# Patient Record
Sex: Female | Born: 1935 | Race: White | Hispanic: No | Smoking: Former smoker
Health system: Southern US, Community
[De-identification: ages and names within clinical notes are randomized; demographics above are authoritative.]

## PROBLEM LIST (undated history)

## (undated) DIAGNOSIS — F419 Anxiety disorder, unspecified: Secondary | ICD-10-CM

## (undated) DIAGNOSIS — I739 Peripheral vascular disease, unspecified: Secondary | ICD-10-CM

## (undated) DIAGNOSIS — I471 Supraventricular tachycardia, unspecified: Secondary | ICD-10-CM

## (undated) DIAGNOSIS — R55 Syncope and collapse: Secondary | ICD-10-CM

## (undated) DIAGNOSIS — E785 Hyperlipidemia, unspecified: Secondary | ICD-10-CM

## (undated) DIAGNOSIS — Z789 Other specified health status: Secondary | ICD-10-CM

## (undated) DIAGNOSIS — N183 Chronic kidney disease, stage 3 unspecified: Secondary | ICD-10-CM

## (undated) DIAGNOSIS — J439 Emphysema, unspecified: Secondary | ICD-10-CM

## (undated) DIAGNOSIS — R569 Unspecified convulsions: Secondary | ICD-10-CM

## (undated) DIAGNOSIS — N814 Uterovaginal prolapse, unspecified: Secondary | ICD-10-CM

## (undated) DIAGNOSIS — I10 Essential (primary) hypertension: Secondary | ICD-10-CM

## (undated) HISTORY — DX: Uterovaginal prolapse, unspecified: N81.4

## (undated) HISTORY — DX: Other specified health status: Z78.9

## (undated) HISTORY — DX: Essential (primary) hypertension: I10

## (undated) HISTORY — PX: CHOLECYSTECTOMY: SHX55

## (undated) HISTORY — DX: Supraventricular tachycardia, unspecified: I47.10

## (undated) HISTORY — DX: Supraventricular tachycardia: I47.1

## (undated) HISTORY — DX: Chronic kidney disease, stage 3 unspecified: N18.30

## (undated) HISTORY — DX: Emphysema, unspecified: J43.9

## (undated) HISTORY — DX: Anxiety disorder, unspecified: F41.9

## (undated) HISTORY — DX: Hyperlipidemia, unspecified: E78.5

## (undated) HISTORY — PX: TONSILLECTOMY: SUR1361

## (undated) HISTORY — DX: Peripheral vascular disease, unspecified: I73.9

## (undated) HISTORY — DX: Chronic kidney disease, stage 3 (moderate): N18.3

## (undated) HISTORY — DX: Syncope and collapse: R55

## (undated) HISTORY — PX: OTHER SURGICAL HISTORY: SHX169

---

## 1993-08-05 HISTORY — PX: OTHER SURGICAL HISTORY: SHX169

## 2001-08-07 ENCOUNTER — Other Ambulatory Visit: Admission: RE | Admit: 2001-08-07 | Discharge: 2001-08-07 | Payer: Self-pay | Admitting: Gynecology

## 2001-08-24 ENCOUNTER — Other Ambulatory Visit: Admission: RE | Admit: 2001-08-24 | Discharge: 2001-08-24 | Payer: Self-pay | Admitting: Obstetrics and Gynecology

## 2003-05-13 ENCOUNTER — Emergency Department (HOSPITAL_COMMUNITY): Admission: EM | Admit: 2003-05-13 | Discharge: 2003-05-13 | Payer: Self-pay | Admitting: Emergency Medicine

## 2004-12-25 ENCOUNTER — Ambulatory Visit: Payer: Self-pay | Admitting: Internal Medicine

## 2004-12-28 ENCOUNTER — Ambulatory Visit: Payer: Self-pay | Admitting: Internal Medicine

## 2004-12-28 ENCOUNTER — Ambulatory Visit (HOSPITAL_COMMUNITY): Admission: RE | Admit: 2004-12-28 | Discharge: 2004-12-28 | Payer: Self-pay | Admitting: Internal Medicine

## 2004-12-30 ENCOUNTER — Observation Stay (HOSPITAL_COMMUNITY): Admission: RE | Admit: 2004-12-30 | Discharge: 2004-12-31 | Payer: Self-pay | Admitting: General Surgery

## 2004-12-30 ENCOUNTER — Encounter (INDEPENDENT_AMBULATORY_CARE_PROVIDER_SITE_OTHER): Payer: Self-pay | Admitting: Specialist

## 2005-02-11 ENCOUNTER — Ambulatory Visit: Payer: Self-pay | Admitting: Cardiology

## 2005-02-22 ENCOUNTER — Ambulatory Visit: Payer: Self-pay

## 2006-07-13 ENCOUNTER — Ambulatory Visit: Payer: Self-pay | Admitting: Cardiology

## 2006-08-19 ENCOUNTER — Ambulatory Visit: Payer: Self-pay | Admitting: Internal Medicine

## 2006-10-21 ENCOUNTER — Ambulatory Visit: Payer: Self-pay | Admitting: Internal Medicine

## 2006-10-25 ENCOUNTER — Ambulatory Visit: Payer: Self-pay | Admitting: Internal Medicine

## 2006-10-25 LAB — CONVERTED CEMR LAB
BUN: 25 mg/dL — ABNORMAL HIGH (ref 6–23)
Calcium: 8.5 mg/dL (ref 8.4–10.5)
Cholesterol: 227 mg/dL (ref 0–200)
HDL: 28.8 mg/dL — ABNORMAL LOW (ref >39.0)
LDL DIRECT: 152 mg/dL
Sodium: 140 meq/L (ref 135–145)
Triglyceride fasting, serum: 141 mg/dL (ref 0–149)

## 2007-08-09 ENCOUNTER — Ambulatory Visit: Payer: Self-pay | Admitting: Cardiology

## 2007-08-25 ENCOUNTER — Telehealth: Payer: Self-pay | Admitting: Internal Medicine

## 2007-10-16 ENCOUNTER — Encounter: Payer: Self-pay | Admitting: Internal Medicine

## 2007-10-17 ENCOUNTER — Telehealth: Payer: Self-pay | Admitting: Internal Medicine

## 2007-11-14 ENCOUNTER — Encounter: Payer: Self-pay | Admitting: Internal Medicine

## 2007-11-14 DIAGNOSIS — I10 Essential (primary) hypertension: Secondary | ICD-10-CM

## 2007-11-14 DIAGNOSIS — E785 Hyperlipidemia, unspecified: Secondary | ICD-10-CM | POA: Insufficient documentation

## 2007-11-14 DIAGNOSIS — Z8679 Personal history of other diseases of the circulatory system: Secondary | ICD-10-CM | POA: Insufficient documentation

## 2007-11-14 DIAGNOSIS — I471 Supraventricular tachycardia: Secondary | ICD-10-CM

## 2007-11-14 DIAGNOSIS — N814 Uterovaginal prolapse, unspecified: Secondary | ICD-10-CM | POA: Insufficient documentation

## 2008-01-19 ENCOUNTER — Ambulatory Visit: Payer: Self-pay | Admitting: Internal Medicine

## 2008-01-19 DIAGNOSIS — F419 Anxiety disorder, unspecified: Secondary | ICD-10-CM | POA: Insufficient documentation

## 2008-01-19 LAB — CONVERTED CEMR LAB
ALT: 16 units/L (ref 0–35)
AST: 20 units/L (ref 0–37)
Albumin: 3.5 g/dL (ref 3.5–5.2)
CO2: 29 meq/L (ref 19–32)
Chloride: 108 meq/L (ref 96–112)
Cholesterol: 255 mg/dL (ref 0–200)
Creatinine, Ser: 1.3 mg/dL — ABNORMAL HIGH (ref 0.4–1.2)
HDL: 27.4 mg/dL — ABNORMAL LOW (ref >39.0)
Potassium: 4.7 meq/L (ref 3.5–5.1)
Sodium: 142 meq/L (ref 135–145)
Total Protein: 7.1 g/dL (ref 6.0–8.3)
VLDL: 35 mg/dL (ref 0–40)

## 2008-01-20 ENCOUNTER — Encounter: Payer: Self-pay | Admitting: Internal Medicine

## 2008-08-02 ENCOUNTER — Ambulatory Visit: Payer: Self-pay | Admitting: Internal Medicine

## 2008-08-15 ENCOUNTER — Ambulatory Visit: Payer: Self-pay | Admitting: Cardiovascular Disease

## 2008-08-15 LAB — CONVERTED CEMR LAB
CO2: 30 meq/L (ref 19–32)
Chloride: 107 meq/L (ref 96–112)
Sodium: 143 meq/L (ref 135–145)

## 2008-08-16 ENCOUNTER — Ambulatory Visit: Payer: Self-pay

## 2008-08-29 ENCOUNTER — Ambulatory Visit: Payer: Self-pay

## 2008-09-04 ENCOUNTER — Ambulatory Visit: Payer: Self-pay | Admitting: Cardiovascular Disease

## 2008-09-04 LAB — CONVERTED CEMR LAB
BUN: 29 mg/dL — ABNORMAL HIGH (ref 6–23)
CO2: 30 meq/L (ref 19–32)
Creatinine, Ser: 1.2 mg/dL (ref 0.4–1.2)
GFR calc Af Amer: 56 mL/min
GFR calc non Af Amer: 47 mL/min
Glucose, Bld: 104 mg/dL — ABNORMAL HIGH (ref 70–99)

## 2008-09-10 ENCOUNTER — Ambulatory Visit (HOSPITAL_COMMUNITY): Admission: RE | Admit: 2008-09-10 | Discharge: 2008-09-10 | Payer: Self-pay | Admitting: Cardiovascular Disease

## 2008-10-17 ENCOUNTER — Encounter: Payer: Self-pay | Admitting: Internal Medicine

## 2009-03-05 ENCOUNTER — Telehealth: Payer: Self-pay | Admitting: Internal Medicine

## 2009-04-14 ENCOUNTER — Telehealth: Payer: Self-pay | Admitting: Cardiovascular Disease

## 2009-04-15 ENCOUNTER — Telehealth: Payer: Self-pay | Admitting: Internal Medicine

## 2009-04-16 ENCOUNTER — Ambulatory Visit: Payer: Self-pay | Admitting: Internal Medicine

## 2009-04-28 ENCOUNTER — Telehealth (INDEPENDENT_AMBULATORY_CARE_PROVIDER_SITE_OTHER): Payer: Self-pay | Admitting: *Deleted

## 2009-04-29 ENCOUNTER — Ambulatory Visit: Payer: Self-pay | Admitting: Internal Medicine

## 2009-04-29 LAB — CONVERTED CEMR LAB
ALT: 12 units/L (ref 0–35)
AST: 21 units/L (ref 0–37)
Alkaline Phosphatase: 72 units/L (ref 39–117)
Basophils Relative: 1.2 % (ref 0.0–3.0)
Bilirubin, Direct: 0.1 mg/dL (ref 0.0–0.3)
CO2: 27 meq/L (ref 19–32)
HCT: 37.8 % (ref 36.0–46.0)
Hemoglobin: 12.8 g/dL (ref 12.0–15.0)
Monocytes Relative: 5.8 % (ref 3.0–12.0)
Platelets: 223 10*3/uL (ref 150.0–400.0)
Total Protein: 7.5 g/dL (ref 6.0–8.3)
WBC: 7.7 10*3/uL (ref 4.5–10.5)

## 2009-05-02 ENCOUNTER — Telehealth: Payer: Self-pay | Admitting: Internal Medicine

## 2009-05-06 ENCOUNTER — Ambulatory Visit: Payer: Self-pay | Admitting: *Deleted

## 2009-05-06 ENCOUNTER — Ambulatory Visit: Payer: Self-pay | Admitting: Cardiovascular Disease

## 2009-05-06 ENCOUNTER — Inpatient Hospital Stay (HOSPITAL_COMMUNITY): Admission: EM | Admit: 2009-05-06 | Discharge: 2009-05-09 | Payer: Self-pay | Admitting: Emergency Medicine

## 2009-05-06 ENCOUNTER — Ambulatory Visit: Payer: Self-pay | Admitting: Internal Medicine

## 2009-05-07 ENCOUNTER — Encounter (INDEPENDENT_AMBULATORY_CARE_PROVIDER_SITE_OTHER): Payer: Self-pay | Admitting: Internal Medicine

## 2009-05-08 ENCOUNTER — Encounter: Payer: Self-pay | Admitting: Internal Medicine

## 2009-05-10 ENCOUNTER — Telehealth (INDEPENDENT_AMBULATORY_CARE_PROVIDER_SITE_OTHER): Payer: Self-pay | Admitting: *Deleted

## 2009-05-13 ENCOUNTER — Ambulatory Visit: Payer: Self-pay | Admitting: Internal Medicine

## 2009-05-15 ENCOUNTER — Telehealth: Payer: Self-pay | Admitting: Internal Medicine

## 2009-06-09 ENCOUNTER — Telehealth: Payer: Self-pay | Admitting: Internal Medicine

## 2009-06-09 ENCOUNTER — Ambulatory Visit: Payer: Self-pay | Admitting: Internal Medicine

## 2009-06-09 DIAGNOSIS — I951 Orthostatic hypotension: Secondary | ICD-10-CM | POA: Insufficient documentation

## 2009-06-10 ENCOUNTER — Inpatient Hospital Stay (HOSPITAL_COMMUNITY): Admission: EM | Admit: 2009-06-10 | Discharge: 2009-06-12 | Payer: Self-pay | Admitting: Cardiovascular Disease

## 2009-06-10 ENCOUNTER — Encounter: Payer: Self-pay | Admitting: Emergency Medicine

## 2009-06-30 ENCOUNTER — Telehealth: Payer: Self-pay | Admitting: Cardiovascular Disease

## 2009-07-02 ENCOUNTER — Telehealth (INDEPENDENT_AMBULATORY_CARE_PROVIDER_SITE_OTHER): Payer: Self-pay | Admitting: *Deleted

## 2009-07-04 ENCOUNTER — Ambulatory Visit: Payer: Self-pay | Admitting: Internal Medicine

## 2009-07-04 DIAGNOSIS — F329 Major depressive disorder, single episode, unspecified: Secondary | ICD-10-CM

## 2009-07-10 ENCOUNTER — Ambulatory Visit: Payer: Self-pay | Admitting: Internal Medicine

## 2009-07-16 ENCOUNTER — Observation Stay (HOSPITAL_COMMUNITY): Admission: EM | Admit: 2009-07-16 | Discharge: 2009-07-17 | Payer: Self-pay | Admitting: Emergency Medicine

## 2009-07-17 ENCOUNTER — Encounter: Payer: Self-pay | Admitting: Internal Medicine

## 2009-07-17 DIAGNOSIS — I5032 Chronic diastolic (congestive) heart failure: Secondary | ICD-10-CM

## 2009-08-04 ENCOUNTER — Ambulatory Visit: Payer: Self-pay | Admitting: Internal Medicine

## 2009-08-04 LAB — CONVERTED CEMR LAB
BUN: 20 mg/dL (ref 6–23)
CO2: 30 meq/L (ref 19–32)
Calcium: 9 mg/dL (ref 8.4–10.5)
Chloride: 102 meq/L (ref 96–112)
Creatinine, Ser: 1.4 mg/dL — ABNORMAL HIGH (ref 0.4–1.2)
Glucose, Bld: 75 mg/dL (ref 70–99)
Sodium: 143 meq/L (ref 135–145)

## 2009-09-08 ENCOUNTER — Ambulatory Visit: Payer: Self-pay | Admitting: Internal Medicine

## 2009-09-08 DIAGNOSIS — I739 Peripheral vascular disease, unspecified: Secondary | ICD-10-CM | POA: Insufficient documentation

## 2009-09-17 ENCOUNTER — Telehealth: Payer: Self-pay | Admitting: Internal Medicine

## 2009-09-24 ENCOUNTER — Encounter: Payer: Self-pay | Admitting: Internal Medicine

## 2009-09-25 ENCOUNTER — Telehealth: Payer: Self-pay | Admitting: Internal Medicine

## 2009-10-01 ENCOUNTER — Telehealth: Payer: Self-pay | Admitting: Internal Medicine

## 2009-11-07 ENCOUNTER — Ambulatory Visit: Payer: Self-pay | Admitting: Internal Medicine

## 2009-11-11 ENCOUNTER — Encounter: Payer: Self-pay | Admitting: Internal Medicine

## 2009-11-11 LAB — CONVERTED CEMR LAB
AST: 19 units/L (ref 0–37)
Triglycerides: 79 mg/dL (ref 0.0–149.0)

## 2009-11-14 ENCOUNTER — Telehealth: Payer: Self-pay | Admitting: Internal Medicine

## 2009-12-07 ENCOUNTER — Encounter: Payer: Self-pay | Admitting: Internal Medicine

## 2009-12-12 ENCOUNTER — Telehealth: Payer: Self-pay | Admitting: Internal Medicine

## 2009-12-23 ENCOUNTER — Telehealth: Payer: Self-pay | Admitting: Internal Medicine

## 2010-01-09 ENCOUNTER — Ambulatory Visit: Payer: Self-pay | Admitting: Internal Medicine

## 2010-01-15 LAB — CONVERTED CEMR LAB
Cholesterol: 129 mg/dL (ref 0–200)
HDL: 38.9 mg/dL — ABNORMAL LOW (ref >39.00)

## 2010-04-10 ENCOUNTER — Ambulatory Visit: Payer: Self-pay | Admitting: Internal Medicine

## 2010-04-10 DIAGNOSIS — Z8679 Personal history of other diseases of the circulatory system: Secondary | ICD-10-CM

## 2010-05-06 ENCOUNTER — Telehealth: Payer: Self-pay | Admitting: Internal Medicine

## 2010-05-08 ENCOUNTER — Telehealth: Payer: Self-pay | Admitting: Internal Medicine

## 2010-06-30 ENCOUNTER — Encounter: Payer: Self-pay | Admitting: Internal Medicine

## 2010-07-02 ENCOUNTER — Telehealth: Payer: Self-pay | Admitting: Internal Medicine

## 2010-10-13 ENCOUNTER — Telehealth (INDEPENDENT_AMBULATORY_CARE_PROVIDER_SITE_OTHER): Payer: Self-pay | Admitting: *Deleted

## 2010-10-14 ENCOUNTER — Encounter: Payer: Self-pay | Admitting: Internal Medicine

## 2010-11-05 ENCOUNTER — Telehealth: Payer: Self-pay | Admitting: Internal Medicine

## 2010-11-08 ENCOUNTER — Encounter: Payer: Self-pay | Admitting: Internal Medicine

## 2010-11-08 ENCOUNTER — Encounter: Payer: Self-pay | Admitting: Cardiovascular Disease

## 2010-11-11 ENCOUNTER — Encounter: Payer: Self-pay | Admitting: Internal Medicine

## 2010-11-15 LAB — CONVERTED CEMR LAB
BUN: 27 mg/dL — ABNORMAL HIGH (ref 6–23)
Bilirubin, Direct: 0.3 mg/dL (ref 0.0–0.3)
CO2: 22 meq/L (ref 19–32)
CO2: 28 meq/L (ref 19–32)
Calcium: 9.6 mg/dL (ref 8.4–10.5)
Chloride: 100 meq/L (ref 96–112)
Creatinine, Ser: 1.74 mg/dL — ABNORMAL HIGH (ref 0.40–1.20)
GFR calc non Af Amer: 51.42 mL/min (ref >60)
Glucose, Bld: 106 mg/dL — ABNORMAL HIGH (ref 70–99)
Glucose, Bld: 90 mg/dL (ref 70–99)
HCT: 40.4 % (ref 36.0–46.0)
INR: 1.1 — ABNORMAL HIGH (ref 0.8–1.0)
Lymphs Abs: 1.1 10*3/uL (ref 0.7–4.0)
Monocytes Relative: 4.2 % (ref 3.0–12.0)
Neutro Abs: 5.9 10*3/uL (ref 1.4–7.7)
Neutrophils Relative %: 80.3 % — ABNORMAL HIGH (ref 43.0–77.0)
Platelets: 230 10*3/uL (ref 150.0–400.0)
Sodium: 144 meq/L (ref 135–145)
Total Bilirubin: 0.4 mg/dL (ref 0.3–1.2)
Total Protein: 8.1 g/dL (ref 6.0–8.3)

## 2010-11-17 NOTE — Progress Notes (Signed)
    Immunization History:  Influenza Immunization History:    Influenza:  historical (06/30/2010)

## 2010-11-17 NOTE — Progress Notes (Signed)
Summary: Driving  Phone Note Call from Patient Call back at Home Phone [phone]   Caller: Patient Summary of Call: Patient called requesting to know if MD gives her permission to drive or if she should wait a few more days. Please advise Initial call taken by: Rock Nephew CMA,  May 15, 2009 9:39 AM  Follow-up for Phone Call        may drive if heart rate is staying below 130 and she is not having any near-syncopal spells or symptoms Follow-up by: Jacques Navy MD,  May 15, 2009 1:16 PM  Additional Follow-up for Phone Call Additional follow up Details #1::        Pt notified Additional Follow-up by: Orlan Leavens,  May 15, 2009 1:58 PM

## 2010-11-17 NOTE — Letter (Signed)
Summary: Letter from Patient  Letter from Patient   Imported By: Sherian Rein 12/29/2009 08:18:22  _____________________________________________________________________  External Attachment:    Type:   Image     Comment:   External Document

## 2010-11-17 NOTE — Progress Notes (Signed)
Summary: req call back  Phone Note Call from Patient Call back at Home Phone [phone]   Caller: Patient Reason for Call: Talk to Nurse Summary of Call: req call back.PLEASE CALL AFTER 2:00 Initial call taken by: Migdalia Dk,  November 14, 2009 8:27 AM  Follow-up for Phone Call        Called patient..she needs new script for Crestor 5mg . Will send to [facility]. Follow-up by: J REISS RN    Prescriptions: CRESTOR 5 MG TABS (ROSUVASTATIN CALCIUM) one  tablet every day at bedtime  #30 x 3   Entered by:   Layne Benton, RN, BSN   Authorized by:   Sherrill Raring, MD, [facility]   Signed by:   Layne Benton, RN, BSN on 11/14/2009   Method used:   Electronically to        [facility] [phone]* (retail)       [address]       [place], [place]  [address]       Ph: [phone]       Fax: [phone]   RxID:   [id number]

## 2010-11-17 NOTE — Progress Notes (Signed)
Summary: ?'s  Phone Note Call from Patient   Summary of Call: Pt dropped of a note with questions whether or not to stay on gen welbutrin. Yes per Dr. Rock Nephew informed, refill sent in.   Pt read the insert with the rx, crestor. It says to inform dr about medication prior to surgery or dental work. Does this include regular dental check up? Fillings? or major work? She spoke with pharmacist and dentist but they all said to talk to the Dr. Per pt, cardiology has not called her back about this and she would like Dr Debby Bud opinion.  Initial call taken by: Lamar Sprinkles, CMA,  December 12, 2009 8:41 AM  Follow-up for Phone Call        crestor and dental work no problem Follow-up by: Jacques Navy MD,  December 15, 2009 10:33 AM  Additional Follow-up for Phone Call Additional follow up Details #1::        Left pt vm with above Additional Follow-up by: Lamar Sprinkles, CMA,  December 16, 2009 10:21 AM    Prescriptions: Lerry Paterson XL 150 MG XR24H-TAB (BUPROPION HCL) 1 by mouth q AM for depression  #30 x 5   Entered by:   Lamar Sprinkles, CMA   Authorized by:   Jacques Navy MD   Signed by:   Lamar Sprinkles, CMA on 12/12/2009   Method used:   Electronically to        [facility]. [phone]* (retail)       [address]       [place], [place]  [address]       Ph: [phone]       Fax: [phone]   RxID:   [id number]

## 2010-11-17 NOTE — Progress Notes (Signed)
  Phone Note Refill Request Message from:  Fax from Pharmacy on May 06, 2010 10:50 AM  Refills Requested: Medication #1:  ALPRAZOLAM 0.25 MG TABS Take 1 tablet by mouth four times a day   Last Refilled: 03/30/2010 Please Advise refill.   Initial call taken by: Ami Bullins CMA,  May 06, 2010 10:50 AM

## 2010-11-17 NOTE — Miscellaneous (Signed)
Summary: Flu Vaccination/[facility]  [facility] Vaccination[facility]   Imported By: Sherian Rein 07/06/2010 12:23:16  _____________________________________________________________________  External Attachment:    Type:   Image     Comment:   External Document

## 2010-11-17 NOTE — Assessment & Plan Note (Signed)
Summary: rov/sl   Visit Type:  Follow-up Referring Provider:  Angelina Sheriff, MD Primary Provider:  Jacques Navy MD   History of Present Illness: Ms. Kropp is a 77 year old woman with a history of PVOD, dyslipidemia, SVT(s/p ablation) and diastolic CHF.  She was last seen by Hillis Range and Earney Hamburg.  She was  discharged from the hospital at the end of September.  I saw her after that. since last fall she has onde well.  She deneis chest pain.  No shortness of breath.  No palpitations.  Current Medications (verified): 1)  Alprazolam 0.25 Mg Tabs (Alprazolam) .... Take 1 Tablet By Mouth Four Times A Day 2)  Atenolol 50 Mg Tabs (Atenolol) .... Take 1 Tablet By Mouth Once A Day 3)  Cardizem Cd 180 Mg Xr24h-Cap (Diltiazem Hcl Coated Beads) .Marland Kitchen.. 1 By Mouth Once Daily 4)  Pletal 100 Mg Tabs (Cilostazol) .... Take 1/2 Tab Every Am and 1 Tab Every Pm 5)  Benefiber  Powd (Wheat Dextrin) .... Take One Teaspoon Once Daily 6)  Budeprion Xl 150 Mg Xr24h-Tab (Bupropion Hcl) .Marland Kitchen.. 1 By Mouth Q Am For Depression 7)  Cipro 500 Mg Tabs (Ciprofloxacin Hcl) .... One By Mouth Before Dental Work and One By Mouth After Dental Work 8)  Furosemide 20 Mg Tabs (Furosemide) .Marland Kitchen.. 1 By Mouth Once Daily 9)  Crestor 5 Mg Tabs (Rosuvastatin Calcium) .... One  Tablet Every Day At Bedtime 10)  Aspirin 81 Mg Tbec (Aspirin) .... Take One Tablet By Mouth Daily  Allergies (verified): 1)  ! Penicillin 2)  ! Sulfa 3)  ! Iodine 4)  ! Codeine 5)  ! Valium 6)  ! * Shellfish  Past History:  Past medical, surgical, family and social histories (including risk factors) reviewed, and no changes noted (except as noted below).  Past Medical History: Reviewed history from 06/09/2009 and no changes required. 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 Corrective eye surgery as a child Cholecystectomy Tonsillectomy IVD Removed Stress Cardiolite (08/05/1993) RF Ablation PSVT - Summer '10  Family History: Reviewed history from 06/09/2009 and no changes required. Positive for Lipids, CAD, HTN. Neg- colon cancer, breast cancer, diabetes  Social History: Reviewed history from 06/09/2009 and no changes required. married 49 years, widowed approx 2 years. 1 son-'63, 1 daughter '66; 3 grandchildren. lives alone - in [place]. Tob- quit 1995  ETOH denies   Vital Signs:  Patient profile:   77 year old female Height:      65 inches Weight:      132 pounds BMI:     22.05 Pulse rate:   86 / minute BP sitting:   130 / 65  (left arm)  Vitals Entered By: Burnett Kanaris, CNA (April 10, 2010 3:04 PM)  Physical Exam  Additional Exam:  Patient is in NAD HEENT:  Normocephalic, atraumatic. EOMI, PERRLA.  Neck: JVP is normal. No thyromegaly. No bruits.  Lungs: clear to auscultation. No rales no wheezes.  Heart: Regular rate and rhythm. Normal S1, S2. No S3.   No significant murmurs. PMI not displaced.  Abdomen:  Supple, nontender. Normal bowel sounds. No masses. No hepatomegaly.  Extremities:   No lower extremity edema.  Musculoskeletal :moving all extremities.  Neuro:   alert and oriented x3.    EKG  Procedure date:  04/10/2010  Findings:      NSR.  85 bpm.  Nonspecific ST T wave changes.    Impression & Recommendations:  Problem # 1:  SUPRAVENTRICULAR TACHYCARDIA (ICD-427.89) No signs of recurrent arrhythmia.  Continue meds. Her updated medication list for this problem includes:    Atenolol 50 Mg Tabs (Atenolol) .Marland Kitchen... Take 1 tablet by mouth once a day    Cardizem Cd 180 Mg Xr24h-cap (Diltiazem hcl coated beads) .Marland Kitchen... 1 by mouth once daily    Pletal 100 Mg Tabs  (Cilostazol) .Marland Kitchen... Take 1/2 tab every am and 1 tab every pm    Aspirin 81 Mg Tbec (Aspirin) .Marland Kitchen... Take one tablet by mouth daily  Problem # 2:  HYPERTENSION (ICD-401.9)  Adequate control.  Keep on current regimen. Her updated medication list for this problem includes:    Atenolol 50 Mg Tabs (Atenolol) .Marland Kitchen... Take 1 tablet by mouth once a day    Cardizem Cd 180 Mg Xr24h-cap (Diltiazem hcl coated beads) .Marland Kitchen... 1 by mouth once daily    Furosemide 20 Mg Tabs (Furosemide) .Marland Kitchen... 1 by mouth once daily    Aspirin 81 Mg Tbec (Aspirin) .Marland Kitchen... Take one tablet by mouth daily  Orders: T-Basic Metabolic Panel ([phone])  Problem # 3:  HYPERLIPIDEMIA (ICD-272.4) Continue meds.    LDL in march was 79; HDL 39.  Will check AST.   Her updated medication list for this problem includes:    Crestor 5 Mg Tabs (Rosuvastatin calcium) ..... One  tablet every day at bedtime  Orders: T-Basic Metabolic Panel [phone]) T-Hepatic Function ([phone])  Problem # 4:  PERIPHERAL CIRCULATORY DISORDER (ICD-V12.59) patient with significant disease but without signif symptoms.  Continue current Rx. Her updated medication list for this problem includes:    Atenolol 50 Mg Tabs (Atenolol) .Marland Kitchen... Take 1 tablet by mouth once a day    Cardizem Cd 180 Mg Xr24h-cap (Diltiazem hcl coated beads) .Marland Kitchen... 1 by mouth once daily    Pletal 100 Mg Tabs (Cilostazol) .Marland Kitchen... Take 1/2 tab every am and 1 tab every pm    Aspirin 81 Mg Tbec (Aspirin) .Marland Kitchen... Take one tablet by mouth daily  Other Orders: EKG w/ Interpretation (93000)  Patient Instructions: 1)  Your physician recommends that you return for a FASTING lipid profile: Feb 2012 2)  Your physician wants you to follow-up in: feb 2012  You will receive a reminder letter in the mail two months in advance. If you don't receive a letter, please call our office to schedule the follow-up appointment.

## 2010-11-17 NOTE — Progress Notes (Signed)
  Phone Note Refill Request Message from:  Fax from Pharmacy on May 08, 2010 11:25 AM  Refills Requested: Medication #1:  ALPRAZOLAM 0.25 MG TABS Take 1 tablet by mouth four times a day please Advise refill  Initial call taken by: Ami Bullins CMA,  May 08, 2010 11:25 AM  Follow-up for Phone Call        ok to refill x 5  Follow-up by: Jacques Navy MD,  May 08, 2010 2:56 PM    Prescriptions: ALPRAZOLAM 0.25 MG TABS (ALPRAZOLAM) Take 1 tablet by mouth four times a day  #120 x 5   Entered by:   Ami Bullins CMA   Authorized by:   Jacques Navy MD   Signed by:   Bill Salinas CMA on 05/08/2010   Method used:   Telephoned to ...       [facility] [phone]* (retail)       [address]       [place], [place]  [address]       Ph: [phone]       Fax: [phone]   RxID:   [id number]

## 2010-11-17 NOTE — Miscellaneous (Signed)
  Clinical Lists Changes  Medications: Changed medication from CRESTOR 5 MG TABS (ROSUVASTATIN CALCIUM) one half a tablet every day at bedtime to CRESTOR 5 MG TABS (ROSUVASTATIN CALCIUM) one  tablet every day at bedtime

## 2010-11-17 NOTE — Progress Notes (Signed)
Summary: refill**Please resend** sent  Phone Note Refill Request   Refills Requested: Medication #1:  CILOSTAZOL 100MG  TABLETS TAKE  1/2 TABLET BY MOUTH EVERY MORNING AND 1 TABLET EVERY EVENING   Supply Requested: 3 months PLEASE RESEND, [facility]   Method Requested: Electronic Initial call taken by: Migdalia Dk,  December 23, 2009 10:21 AM    Prescriptions: PLETAL 100 MG TABS (CILOSTAZOL) Take 1/2 tab every AM and 1 tab every PM  #60.0 Each x 3   Entered by:   Burnett Kanaris, CNA   Authorized by:   Sherrill Raring, MD, [facility]   Signed by:   Burnett Kanaris, CNA on 12/23/2009   Method used:   Electronically to        [facility] [phone]* (retail)       [address]       [place], [place]  [address]       Ph: [phone]       Fax: [phone]   RxID:   [id number]

## 2010-11-19 NOTE — Progress Notes (Signed)
  Phone Note Refill Request   Refills Requested: Medication #1:  ALPRAZOLAM 0.25 MG TABS Take 1 tablet by mouth four times a day Last office visit 07/2009  Initial call taken by: Lamar Sprinkles, CMA,  November 05, 2010 9:22 AM  Follow-up for Phone Call        ok for one month. Needs OV Follow-up by: Jacques Navy MD,  November 05, 2010 5:28 PM    Prescriptions: ALPRAZOLAM 0.25 MG TABS (ALPRAZOLAM) Take 1 tablet by mouth four times a day  #120 x 0   Entered by:   Ami Bullins CMA   Authorized by:   Jacques Navy MD   Signed by:   Bill Salinas CMA on 11/06/2010   Method used:   Telephoned to ...       [facility] [phone]* (retail)       [address]       [place], [place]  [address]       Ph: [phone]       Fax: [phone]   RxID:   [id number]

## 2010-11-19 NOTE — Progress Notes (Signed)
  Phone Note Other Incoming   Request: Send information Summary of Call: Request for records received from [facility]. Request forwarded to [facility].     

## 2010-12-02 ENCOUNTER — Telehealth: Payer: Self-pay | Admitting: Internal Medicine

## 2010-12-03 ENCOUNTER — Ambulatory Visit (INDEPENDENT_AMBULATORY_CARE_PROVIDER_SITE_OTHER): Payer: Medicare Other | Admitting: Internal Medicine

## 2010-12-03 ENCOUNTER — Other Ambulatory Visit: Payer: Self-pay | Admitting: Internal Medicine

## 2010-12-03 ENCOUNTER — Encounter: Payer: Self-pay | Admitting: Internal Medicine

## 2010-12-03 DIAGNOSIS — E7800 Pure hypercholesterolemia, unspecified: Secondary | ICD-10-CM

## 2010-12-03 DIAGNOSIS — I509 Heart failure, unspecified: Secondary | ICD-10-CM

## 2010-12-03 LAB — BASIC METABOLIC PANEL
CO2: 29 mEq/L (ref 19–32)
Calcium: 9.2 mg/dL (ref 8.4–10.5)
Creatinine, Ser: 1.2 mg/dL (ref 0.4–1.2)
Glucose, Bld: 81 mg/dL (ref 70–99)

## 2010-12-03 LAB — LIPID PANEL
Cholesterol: 134 mg/dL (ref 0–200)
HDL: 37.9 mg/dL — ABNORMAL LOW (ref >39.00)
Total CHOL/HDL Ratio: 4
Triglycerides: 116 mg/dL (ref 0.0–149.0)

## 2010-12-09 NOTE — Assessment & Plan Note (Signed)
Summary: rov/sl/per pt call/mj/ac   Visit Type:  Follow-up Referring Provider:  Angelina Sheriff, MD Primary Provider:  Jacques Navy MD  CC:  no complaints.  History of Present Illness: Kayla Kent is a 77 year old woman with a history of PVOD, dyslipidemia, SVT(s/p ablation) and diastolic CHF.   I saw her in June 2011 Since seen she denies CP, no dizziness, no palpitaitons.  Breathing is OK. Does complain of uterine prolapse.  has not been seen by anyone for this. DId nave blood work recently for insurance.  Turned down because proBNP was elevated.  Current Medications (verified): 1)  Alprazolam 0.25 Mg Tabs (Alprazolam) .... Take 1 Tablet By Mouth Four Times A Day 2)  Atenolol 50 Mg Tabs (Atenolol) .... Take 1 Tablet By Mouth Once A Day 3)  Cardizem Cd 180 Mg Xr24h-Cap (Diltiazem Hcl Coated Beads) .Marland Kitchen.. 1 By Mouth Once Daily 4)  Pletal 100 Mg Tabs (Cilostazol) .... Take 1/2 Tab Every Am and 1 Tab Every Pm 5)  Benefiber  Powd (Wheat Dextrin) .... Take One Teaspoon Once Daily 6)  Budeprion Xl 150 Mg Xr24h-Tab (Bupropion Hcl) .Marland Kitchen.. 1 By Mouth Q Am For Depression 7)  Cipro 500 Mg Tabs (Ciprofloxacin Hcl) .... One By Mouth Before Dental Work and One By Mouth After Dental Work 8)  Furosemide 20 Mg Tabs (Furosemide) .Marland Kitchen.. 1 By Mouth Once Daily 9)  Crestor 5 Mg Tabs (Rosuvastatin Calcium) .... One  Tablet Every Day At Bedtime 10)  Aspirin 81 Mg Tbec (Aspirin) .... Take One Tablet By Mouth Daily  Allergies (verified): 1)  ! Penicillin 2)  ! Sulfa 3)  ! Iodine 4)  ! Codeine 5)  ! Valium 6)  ! * Shellfish 7)  ! * Strawberries 8)  ! * Chocolate  Past History:  Past medical, surgical, family and social histories (including risk factors) reviewed, and no changes noted (except as noted below).  Past Medical History: Reviewed history from 06/09/2009 and no changes required. 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 Corrective eye surgery as a child Cholecystectomy Tonsillectomy IVD Removed Stress Cardiolite (08/05/1993) RF Ablation PSVT - Summer '10  Family History: Reviewed history from 06/09/2009 and no changes required. Positive for Lipids, CAD, HTN. Neg- colon cancer, breast cancer, diabetes  Social History: Reviewed history from 06/09/2009 and no changes required. married 49 years, widowed approx 2 years. 1 son-'63, 1 daughter '66; 3 grandchildren. lives alone - in [place]. Tob- quit 1995  ETOH denies   Review of Systems       Reviewe.dd  LDL 86, HDL 38.  Otherwise alls systems eviewed.  Neg to the above problem except as noted above.  Vital Signs:  Patient profile:   77 year old female Height:      65 inches Weight:      138.50 pounds BMI:     23.13 Pulse rate:   64 / minute BP sitting:   126 / 64  (left arm) Cuff size:   regular  Vitals Entered By: Caralee Ates CMA (December 03, 2010 12:12 PM)  Physical Exam  Additional Exam:  patient is in NAD HEENT:  Normocephalic, atraumatic. EOMI, PERRLA.  Neck: JVP is normal. No thyromegaly. No bruits.  Lungs: clear to auscultation. No rales no wheezes.  Heart: Regular rate and rhythm.  Normal S1, S2. No S3.   No significant murmurs. PMI not displaced.  Abdomen:  Supple, nontender. Normal bowel sounds. No masses. No hepatomegaly.  Extremities:   Good distal pulses throughout. No lower extremity edema.  Musculoskeletal :moving all extremities.  Neuro:   alert and oriented x3.    Impression & Recommendations:  Problem # 1:  CHF, MILD (ICD-428.0) Diastolic CHF. VOlume status looks good Kee pn same regimen for now.  Will get BMET and BNP.  Address Lasix.  Problem # 2:   SUPRAVENTRICULAR TACHYCARDIA (ICD-427.89) No symptoms to sugg recurrence.  Problem # 3:  HYPERLIPIDEMIA (ICD-272.4) Keep on this   Will need to check labs. Her updated medication list for this problem includes:    Crestor 5 Mg Tabs (Rosuvastatin calcium) ..... One  tablet every day at bedtime  Other Orders: TLB-BMP (Basic Metabolic Panel-BMET) (80048-METABOL) TLB-BNP (B-Natriuretic Peptide) (83880-BNPR) TLB-Lipid Panel (80061-LIPID) TLB-AST (SGOT) (84450-SGOT)  Patient Instructions: 1)  lab work today....we will call you with results 2)  Your physician wants you to follow-up in: October 2012 with Dr.Lismary Kiehn  You will receive a reminder letter in the mail two months in advance. If you don't receive a letter, please call our office to schedule the follow-up appointment.

## 2010-12-09 NOTE — Progress Notes (Signed)
Summary: RF - LAST OV 2010  Phone Note Refill Request   Refills Requested: Medication #1:  ALPRAZOLAM 0.25 MG TABS Take 1 tablet by mouth four times a day  Medication #2:  ATENOLOL 50 MG TABS Take 1 tablet by mouth once a day  Medication #3:  CARDIZEM CD 180 MG XR24H-CAP 1 by mouth once daily  Medication #4:  BUDEPRION XL 150 MG XR24H-TAB 1 by mouth q AM for depression Furosemide LAST office visit 2010 - OK FOR FILL?  [facility]  Initial call taken by: Lamar Sprinkles, CMA,  December 02, 2010 6:54 PM  Follow-up for Phone Call        ok for two months, will need OV before any additonal refills.  Follow-up by: Jacques Navy MD,  December 03, 2010 1:20 PM    Prescriptions: BUDEPRION XL 150 MG XR24H-TAB (BUPROPION HCL) 1 by mouth q AM for depression  #30 Tablet x 2   Entered by:   Rock Nephew CMA   Authorized by:   Jacques Navy MD   Signed by:   Rock Nephew CMA on 12/03/2010   Method used:   Telephoned to ...       [facility] [phone]* (retail)       [address]       [place], [place]  [address]       Ph: [phone]       Fax: [phone]   RxID:   [id number] CARDIZEM CD 180 MG XR24H-CAP (DILTIAZEM HCL COATED BEADS) 1 by mouth once daily  #30 Capsule x 2   Entered by:   Rock Nephew CMA   Authorized by:   Jacques Navy MD   Signed by:   Rock Nephew CMA on 12/03/2010   Method used:   Telephoned to ...       [facility] [phone]* (retail)       [address]       [place], [place]  [address]       Ph: [phone]       Fax: [phone]   RxID:   [id number] ATENOLOL 50 MG TABS (ATENOLOL) Take 1 tablet by mouth once a day  #30 Tablet x 2   Entered by:   Rock Nephew CMA   Authorized by:   Jacques Navy MD   Signed by:   Rock Nephew CMA on 12/03/2010   Method used:   Telephoned to ...       [facility] [phone]* (retail)       [address]       [place], [place]  [address]       Ph: [phone]       Fax: [phone]   RxID:   [id number] ALPRAZOLAM 0.25 MG TABS (ALPRAZOLAM) Take 1 tablet by mouth four times a day  #120 x 2   Entered by:   Rock Nephew CMA   Authorized by:   Jacques Navy MD   Signed by:   Rock Nephew CMA on 12/03/2010   Method used:   Telephoned to ...       [facility] [phone]* (retail)       [address]       [place], [place]  [address]  Ph: [phone]       Fax: [phone]   RxID:   [id number]

## 2010-12-17 ENCOUNTER — Telehealth: Payer: Self-pay | Admitting: Internal Medicine

## 2010-12-24 NOTE — Progress Notes (Addendum)
Summary: pt wants to talk to you  Phone Note Call from Patient Call back at Home Phone [phone]   Caller: Patient Reason for Call: Talk to Nurse, Talk to Doctor Summary of Call: pt wants to talk to you and she will be leaving home at 5pm for a [employer] game and was hoping she could talk to you before she goes Initial call taken by: Omer Jack,  December 17, 2010 3:48 PM  Follow-up for Phone Call        per pt calling back [phone] Lorne Skeens  December 18, 2010 4:11 PM  Additional Follow-up for Phone Call Additional follow up Details #1::        [facility] for call back. Layne Benton, RN, BSN  December 18, 2010 7:00 PM      Appended Document: pt wants to talk to you Called again. No answer and no machine to leave a messsage.  Appended Document: pt wants to talk to you Spoke with patient and she advised me that she has not heard back from the [employer] concerning our labs with the normal BNP comparing to the abnormal BNP that [employer] completed on her. She is not requesting any other documentation from [place] currently.

## 2010-12-24 NOTE — Letter (Signed)
Summary: [employer]  [employer]   Imported By: Marylou Mccoy 12/17/2010 16:16:46  _____________________________________________________________________  External Attachment:    Type:   Image     Comment:   External Document

## 2011-01-08 ENCOUNTER — Other Ambulatory Visit (INDEPENDENT_AMBULATORY_CARE_PROVIDER_SITE_OTHER): Payer: Medicare Other

## 2011-01-08 ENCOUNTER — Telehealth: Payer: Self-pay | Admitting: *Deleted

## 2011-01-08 DIAGNOSIS — N3000 Acute cystitis without hematuria: Secondary | ICD-10-CM

## 2011-01-08 LAB — URINALYSIS, ROUTINE W REFLEX MICROSCOPIC
Bilirubin Urine: NEGATIVE
Nitrite: POSITIVE
Specific Gravity, Urine: 1.01 (ref 1.000–1.030)
Urobilinogen, UA: 0.2 (ref 0.0–1.0)

## 2011-01-08 MED ORDER — CIPROFLOXACIN HCL 250 MG PO TABS: 250.0000 mg | ORAL_TABLET | Freq: Two times a day (BID) | ORAL | Status: AC

## 2011-01-08 NOTE — Telephone Encounter (Signed)
Patient c/o urinary burning. Ok u/a at lab. Patient informed. Hold phone mess open for results

## 2011-01-08 NOTE — Telephone Encounter (Signed)
Please advise, results ready

## 2011-01-08 NOTE — Telephone Encounter (Signed)
Left detailed vm for pt to check w/her pharm

## 2011-01-08 NOTE — Telephone Encounter (Signed)
Positive U/A - ok for cipro 250 mg bid x 5 days. Order done

## 2011-01-22 LAB — POCT CARDIAC MARKERS

## 2011-01-22 LAB — COMPREHENSIVE METABOLIC PANEL
ALT: 15 U/L (ref 0–35)
AST: 21 U/L (ref 0–37)
Albumin: 2.9 g/dL — ABNORMAL LOW (ref 3.5–5.2)
CO2: 25 mEq/L (ref 19–32)
Chloride: 110 mEq/L (ref 96–112)
GFR calc Af Amer: 52 mL/min — ABNORMAL LOW (ref >60)
GFR calc non Af Amer: 43 mL/min — ABNORMAL LOW (ref >60)
Sodium: 142 mEq/L (ref 135–145)
Total Bilirubin: 0.7 mg/dL (ref 0.3–1.2)

## 2011-01-22 LAB — D-DIMER, QUANTITATIVE: D-Dimer, Quant: 0.46 ug/mL-FEU (ref 0.00–0.48)

## 2011-01-22 LAB — BASIC METABOLIC PANEL
CO2: 28 mEq/L (ref 19–32)
Calcium: 8.2 mg/dL — ABNORMAL LOW (ref 8.4–10.5)
Chloride: 106 mEq/L (ref 96–112)
GFR calc Af Amer: 53 mL/min — ABNORMAL LOW (ref >60)
Glucose, Bld: 81 mg/dL (ref 70–99)
Potassium: 3.5 mEq/L (ref 3.5–5.1)
Sodium: 140 mEq/L (ref 135–145)

## 2011-01-22 LAB — DIFFERENTIAL
Basophils Absolute: 0 10*3/uL (ref 0.0–0.1)
Eosinophils Absolute: 0.1 10*3/uL (ref 0.0–0.7)
Eosinophils Relative: 1 % (ref 0–5)
Lymphs Abs: 1.5 10*3/uL (ref 0.7–4.0)
Monocytes Absolute: 0.4 10*3/uL (ref 0.1–1.0)

## 2011-01-22 LAB — CBC
RBC: 4.39 MIL/uL (ref 3.87–5.11)
WBC: 8.4 10*3/uL (ref 4.0–10.5)

## 2011-01-22 LAB — CK TOTAL AND CKMB (NOT AT ARMC)
CK, MB: 1.3 ng/mL (ref 0.3–4.0)
Total CK: 34 U/L (ref 7–177)

## 2011-01-22 LAB — CARDIAC PANEL(CRET KIN+CKTOT+MB+TROPI): Relative Index: INVALID (ref 0.0–2.5)

## 2011-01-22 LAB — TROPONIN I: Troponin I: 0.02 ng/mL (ref 0.00–0.06)

## 2011-01-22 LAB — BRAIN NATRIURETIC PEPTIDE: Pro B Natriuretic peptide (BNP): 398 pg/mL — ABNORMAL HIGH (ref 0.0–100.0)

## 2011-01-23 LAB — BASIC METABOLIC PANEL
BUN: 14 mg/dL (ref 6–23)
Calcium: 8.3 mg/dL — ABNORMAL LOW (ref 8.4–10.5)
Calcium: 8.4 mg/dL (ref 8.4–10.5)
GFR calc Af Amer: 57 mL/min — ABNORMAL LOW (ref >60)
GFR calc non Af Amer: 43 mL/min — ABNORMAL LOW (ref >60)
GFR calc non Af Amer: 47 mL/min — ABNORMAL LOW (ref >60)
Glucose, Bld: 84 mg/dL (ref 70–99)
Potassium: 3.6 mEq/L (ref 3.5–5.1)
Potassium: 3.7 mEq/L (ref 3.5–5.1)
Sodium: 137 mEq/L (ref 135–145)
Sodium: 140 mEq/L (ref 135–145)

## 2011-01-23 LAB — DIFFERENTIAL
Basophils Relative: 0 % (ref 0–1)
Eosinophils Absolute: 0 10*3/uL (ref 0.0–0.7)
Eosinophils Relative: 0 % (ref 0–5)
Lymphs Abs: 2.9 10*3/uL (ref 0.7–4.0)
Monocytes Absolute: 0.9 10*3/uL (ref 0.1–1.0)
Monocytes Relative: 8 % (ref 3–12)

## 2011-01-23 LAB — POCT I-STAT, CHEM 8
Calcium, Ion: 1.14 mmol/L (ref 1.12–1.32)
Creatinine, Ser: 1.5 mg/dL — ABNORMAL HIGH (ref 0.4–1.2)
Glucose, Bld: 145 mg/dL — ABNORMAL HIGH (ref 70–99)
HCT: 43 % (ref 36.0–46.0)
Hemoglobin: 14.6 g/dL (ref 12.0–15.0)
Potassium: 4.1 mEq/L (ref 3.5–5.1)

## 2011-01-23 LAB — POCT CARDIAC MARKERS
CKMB, poc: <1 ng/mL — ABNORMAL LOW (ref 1.0–8.0)
CKMB, poc: <1 ng/mL — ABNORMAL LOW (ref 1.0–8.0)
Myoglobin, poc: 46.5 ng/mL (ref 12–200)
Troponin i, poc: <0.05 ng/mL (ref 0.00–0.09)

## 2011-01-23 LAB — CBC
HCT: 40.9 % (ref 36.0–46.0)
HCT: 35.9 % — ABNORMAL LOW (ref 36.0–46.0)
Hemoglobin: 13.7 g/dL (ref 12.0–15.0)
Hemoglobin: 11.4 g/dL — ABNORMAL LOW (ref 12.0–15.0)
Hemoglobin: 12.2 g/dL (ref 12.0–15.0)
MCHC: 33.5 g/dL (ref 30.0–36.0)
MCHC: 33.9 g/dL (ref 30.0–36.0)
MCV: 89.8 fL (ref 78.0–100.0)
Platelets: 188 10*3/uL (ref 150–400)
RBC: 4.56 MIL/uL (ref 3.87–5.11)
RBC: 3.76 MIL/uL — ABNORMAL LOW (ref 3.87–5.11)
RDW: 15.1 % (ref 11.5–15.5)
RDW: 15.2 % (ref 11.5–15.5)
WBC: 11.6 10*3/uL — ABNORMAL HIGH (ref 4.0–10.5)
WBC: 6.2 10*3/uL (ref 4.0–10.5)

## 2011-01-23 LAB — CARDIAC PANEL(CRET KIN+CKTOT+MB+TROPI)
Relative Index: INVALID (ref 0.0–2.5)
Total CK: 31 U/L (ref 7–177)
Total CK: 38 U/L (ref 7–177)
Troponin I: 0.08 ng/mL — ABNORMAL HIGH (ref 0.00–0.06)

## 2011-01-24 LAB — PROTEIN ELECTROPH W RFLX QUANT IMMUNOGLOBULINS
Alpha-1-Globulin: 6.6 % — ABNORMAL HIGH (ref 2.9–4.9)
Alpha-2-Globulin: 13.1 % — ABNORMAL HIGH (ref 7.1–11.8)
Gamma Globulin: 17.7 % (ref 11.1–18.8)

## 2011-01-24 LAB — CK TOTAL AND CKMB (NOT AT ARMC): Total CK: 56 U/L (ref 7–177)

## 2011-01-24 LAB — UIFE/LIGHT CHAINS/TP QN, 24-HR UR
Albumin, U: DETECTED
Beta, Urine: NOT DETECTED
Free Kappa/Lambda Ratio: 9.34 ratio — ABNORMAL HIGH (ref 0.46–4.00)
Free Lambda Excretion/Day: 6.65 mg/d
Free Lambda Lt Chains,Ur: 0.35 mg/dL (ref 0.08–1.01)
Time: 24 hours
Total Protein, Urine-Ur/day: 78 mg/d (ref 10–140)
Total Protein, Urine: 4.1 mg/dL

## 2011-01-24 LAB — LIPID PANEL
HDL: 23 mg/dL — ABNORMAL LOW (ref >39)
LDL Cholesterol: 154 mg/dL — ABNORMAL HIGH (ref 0–99)
Total CHOL/HDL Ratio: 8.7 RATIO
Triglycerides: 122 mg/dL (ref <150)
VLDL: 24 mg/dL (ref 0–40)

## 2011-01-24 LAB — DIFFERENTIAL
Basophils Relative: 0 % (ref 0–1)
Eosinophils Absolute: 0 10*3/uL (ref 0.0–0.7)
Monocytes Absolute: 0.5 10*3/uL (ref 0.1–1.0)
Monocytes Relative: 6 % (ref 3–12)
Neutrophils Relative %: 76 % (ref 43–77)

## 2011-01-24 LAB — CREATININE CLEARANCE, URINE, 24 HOUR
Creatinine, 24H Ur: 1000 mg/d (ref 700–1800)
Creatinine: 1.39 mg/dL — ABNORMAL HIGH (ref 0.40–1.20)
Urine Total Volume-CRCL: 1900 mL

## 2011-01-24 LAB — COMPREHENSIVE METABOLIC PANEL
ALT: 11 U/L (ref 0–35)
Alkaline Phosphatase: 67 U/L (ref 39–117)
Chloride: 104 mEq/L (ref 96–112)
Glucose, Bld: 102 mg/dL — ABNORMAL HIGH (ref 70–99)
Potassium: 4.2 mEq/L (ref 3.5–5.1)
Sodium: 138 mEq/L (ref 135–145)
Total Bilirubin: 0.7 mg/dL (ref 0.3–1.2)
Total Protein: 7.3 g/dL (ref 6.0–8.3)

## 2011-01-24 LAB — URINALYSIS, ROUTINE W REFLEX MICROSCOPIC
Bilirubin Urine: NEGATIVE
Hgb urine dipstick: NEGATIVE
Ketones, ur: NEGATIVE mg/dL
Protein, ur: NEGATIVE mg/dL
Urobilinogen, UA: 0.2 mg/dL (ref 0.0–1.0)

## 2011-01-24 LAB — CARDIAC PANEL(CRET KIN+CKTOT+MB+TROPI)
CK, MB: 2.5 ng/mL (ref 0.3–4.0)
Total CK: 59 U/L (ref 7–177)
Total CK: 64 U/L (ref 7–177)

## 2011-01-24 LAB — CBC
HCT: 39.7 % (ref 36.0–46.0)
Hemoglobin: 13.3 g/dL (ref 12.0–15.0)
MCHC: 33.4 g/dL (ref 30.0–36.0)
MCV: 88.1 fL (ref 78.0–100.0)
RBC: 4.51 MIL/uL (ref 3.87–5.11)

## 2011-01-24 LAB — URINE CULTURE: Colony Count: >=100000

## 2011-01-24 LAB — BASIC METABOLIC PANEL
Calcium: 8.4 mg/dL (ref 8.4–10.5)
Chloride: 103 mEq/L (ref 96–112)
Creatinine, Ser: 1.42 mg/dL — ABNORMAL HIGH (ref 0.4–1.2)
GFR calc Af Amer: 38 mL/min — ABNORMAL LOW (ref >60)
GFR calc Af Amer: 44 mL/min — ABNORMAL LOW (ref >60)
Potassium: 3.8 mEq/L (ref 3.5–5.1)
Sodium: 136 mEq/L (ref 135–145)

## 2011-01-24 LAB — URINE MICROSCOPIC-ADD ON

## 2011-01-24 LAB — TROPONIN I: Troponin I: 0.06 ng/mL (ref 0.00–0.06)

## 2011-01-24 LAB — IGG, IGA, IGM: IgG (Immunoglobin G), Serum: 1210 mg/dL (ref 694–1618)

## 2011-01-24 LAB — T4, FREE: Free T4: 1.07 ng/dL (ref 0.80–1.80)

## 2011-03-02 NOTE — Discharge Summary (Signed)
Kayla Kent, Kayla Kent                ACCOUNT NO.:  [id number]   MEDICAL RECORD NO.:  [id number]          PATIENT TYPE:  INP   LOCATION:  1404                         FACILITY:  [facility]   PHYSICIAN:  Rosalyn Gess. Norins, MD  DATE OF BIRTH:  December 10, 1931   DATE OF ADMISSION:  05/06/2009  DATE OF DISCHARGE:  05/09/2009                               DISCHARGE SUMMARY   ADMISSION DIAGNOSES:  1. Generalized malaise and fatigue.  2. Syncope with a head laceration.  3. Urinary tract infection.  4. Paroxysmal atrial tachycardia on telemetry.  5. Hypertension.  6. Anxiety.  7. History of peripheral vascular disease.  8. History of hyperlipidemia.   DISCHARGE DIAGNOSES:  1. Generalized malaise and fatigue.  2. Syncope with a head laceration.  3. Urinary tract infection.  4. Paroxysmal atrial tachycardia on telemetry.  5. Hypertension.  6. Anxiety.  7. History of peripheral vascular disease.  8. History of hyperlipidemia.   CONSULTANTS:  Verne Carrow, MD for Cardiology and with follow up  by Rollene Rotunda, MD.   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   HISTORY OF PRESENT ILLNESS:  Patient is a 77 year old woman with a past  medical history significant for paroxysmal atrial tachycardia,  hypertension, hyperlipidemia, history of mitral valve prolapse, who  presented complaining of generalized malaise and fatigue.  She had been  feeling poorly for the past 4 weeks.  She was seen by myself in the  office 3 weeks prior to admission after a 1 week history of decreased  energy.  She had been monitoring her blood pressures at home and had  been hypotensive with systolics in the 80s.  The patient also complained  of nausea, vomiting and diarrhea.  At the time of her office visit, she  was diagnosed with viral gastroenteritis and started on promethazine.   After 2 weeks, the patient  still was feeling unwell and called to get an  office appointment and additional lab work.  She had been instructed to  discontinue verapamil because of hypotensive episodes.  She was started  on Benefiber to help regulate her bowels.  Fortunately, the diarrhea  resolved with Benefiber, but she continued to be nauseated and feeling  ill.  The patient reports that she awoke on the morning of admission at  2 a.m. with fluttering in her upper chest and called EMS.  She denied  chest pain, shortness of breath, cough, leg swelling, melena, no  hematochezia, headache or focal weakness.  She did admit to dysuria.  She was transported to [facility] Emergency Department where urinalysis  revealed trace leukocyte esterase with 3-6 WBCs, many bacteria and she  was diagnosed with urinary tract infection, and given a prescription for  Cipro.   The patient had been sitting up on the gurney getting ready for  discharge where she had a sudden syncopal episode and fell sustaining a  small laceration to the left frontal scalp area.  She was unconscious  for just a few  moments.  CT scan of the head was done in the ER which  was unremarkable.  Laceration was sutured by the emergency department  physician.  The patient is admitted for further evaluation.   Please see EMR records and the admission note for past medical history,  family history and social history.   HOSPITAL COURSE:  The patient was admitted to a telemetry bed.  1. Syncope.  Patient with a syncopal episode.  She did have      orthostatic hypotension at the time of admission.  The patient also      on telemetry had several runs of PSVT with rates up into the 160      range.  By the patient's report, she did not have a position change      associated with her syncopal episode in the ER.  The patient was      seen in consultation by the Cardiology Service.  During the course      of her stay, the patient was restarted on Diltiazem for better  rate      control of her PSVT.  Concomitantly, she was taken off of atenolol      because of hypotensive episodes.  On May 09, 2009, day of      discharge, the patient did well.  Telemetry revealed no episodes of      PSVT.  She was able to ambulate without difficulty.  Orthostatic      vital signs were performed on the day of discharge showing a blood      pressure of 107/45 supine with a pulse of 63, 115/52 with a pulse      was 71 sitting, 115/49 with a pulse of 73 standing.  At this time      with PSVT seemingly controlled on a calcium channel blocker with      orthostasis resolved, the patient is felt to be stable and ready      for discharge home.  She will be seeing Dr. Johney Frame for EP      consultation for possible consideration of more definitive therapy      as an outpatient.  Dr. Antoine Poche has arranged for the patient to be      seen a 8 a.m. on June 09, 2009 by Dr. Johney Frame and this will be      included in the patient's discharge instructions.  2. Urinary tract infection.  The patient grew out E. coli that was      pansensitive.  She was continued on Cipro and converted to oral      medication on May 08, 2009.  She will continue this for an      additional 4 days.  3. Acute renal insufficiency.  The patient did have a mild elevation      in her serum creatinine to 1.9 on April 29, 2009 which was as an      outpatient, then to 1.6, then to 1.42 with final metabolic panel on      May 08, 2009 showing that this was consistent at 1.42.  The      patient did have additional laboratory to evaluate her renal      insufficiency including C. reactive protein that was normal at 0.8,      total protein and 24-hour urine which was normal at 38, creatinine      clearance was calculated on a 24 hour urine specimen and found to  be slightly low at 50 mL per minute.  Protein electrophoresis      revealed possibility of  restricted bands in the gamma region.      Quantitative  immunoglobulins immunofixation is pending.      Immunofixation then was performed.  There were no monoclonal      proteins identified.  Immunoglobulins were quantified which were      unremarkable.  Protein for Bence-Jones protein on urine was normal.      At this point, the patient has no significant underlying cause for      her mild renal insufficiency.  We will follow this as an outpatient      since it is returning to normal range at 1.42 at time of discharge.  4. Cardiovascular.  The patient's blood pressure had been borderline      low.  She was switched to Diltiazem and beta blocker was stopped      with no orthostasis as noted on the day of discharge.  We will      continue her calcium channel blockers as soul agent for blood      pressure control at this time.  5. Anxiety.  The patient will continue on her home regimen including      alprazolam as needed.  She also will be given a new prescriptions      for Zolpidem 5 mg to use q.h.s. for sleep.  She is cautioned not to      take both alprazolam and Zolpidem simultaneously.   PHYSICAL EXAMINATION:  VITAL SIGNS at time discharge:  Temperature 97.8,  blood pressure 107/41, heart rate 65, respirations were 18, O2 sat is  93% on room air.  GENERAL APPEARANCE:  A pleasant woman sitting in bed in no acute  distress.  PULMONARY:  There is no increased work of breathing.  No  audible wheezing.  CARDIOVASCULAR:  The patient has a regular rate.  No  further examination conducted.   LABORATORY DATA:  Additional laboratory results:  Urine culture as noted  was greater than 100,000 colonies of E. coli that was pansensitive.  LDH  was normal at 128.  Lipid profile May 07, 2009 revealed cholesterol  201, triglycerides 122, HDL 23, LDL was 154.  Cardiac enzymes done this  admission were negative.  Free T4 was normal at 1.07.  TSH was normal at  1.986.   DISPOSITION:  The patient is discharged home.  She will be seen in the  office on  Tuesday, May 13, 2009 for suture removal.   DISCHARGE MEDICATIONS:  1. She will continue her home medications including alprazolam 0.25 mg      q.i.d. p.r.n.  2. She does have Phenergan at home 12.5 mg to take every 6 hours      p.r.n.  3. She will continue Pletal 50 mg q.a.m. and 100 mg q.p.m.  4. Verapamil is discontinued diet.  5. Diovan is discontinued.  6. Atenolol is discontinued.  7. Her new medication will be given--Diltia XT 180 mg to take once      daily.  8. Ciprofloxacin 500 mg to take a.m. and p.m. for 4 days.  9. New prescription for Zolpidem 5 mg to take at bedtime as needed.   FOLLOW UP:  The patient will keep her appointment with Dr. Johney Frame on  June 09, 2009.   CONDITION ON DISCHARGE:  Stable.      Rosalyn Gess Norins, MD  Electronically Signed     MEN/MEDQ  D:  05/09/2009  T:  05/10/2009  Job:  [id number]   cc:   Hillis Range, MD  [address]  [place] [address]   Rosalyn Gess. Norins, MD  [address]  [place]  [place] [address]

## 2011-03-02 NOTE — Consult Note (Signed)
NAMEROSINE, SOLECKI                ACCOUNT NO.:  [id number]   MEDICAL RECORD NO.:  [id number]          PATIENT TYPE:  INP   LOCATION:  1404                         FACILITY:  [facility]   PHYSICIAN:  Verne Carrow, MDDATE OF BIRTH:  06/09/1932   DATE OF CONSULTATION:  05/06/2009  DATE OF DISCHARGE:                                 CONSULTATION   REASON FOR CONSULTATION:  Syncope.   HISTORY OF PRESENT ILLNESS:  Ms. Panameno is a pleasant 77 year old  Caucasian female with a history of paroxysmal atrial tachycardia,  hypertension, hyperlipidemia and peripheral vascular disease who is  admitted today from the [facility] Emergency Department after a  syncopal episode.  The patient complains of having weakness and fatigue  for the last month.  She has had diarrhea and has been treated by her  primary care physician with fiber supplements.  She has been trying to  push fluids to avoid dehydration.  She came to the ED today for further  evaluation after she felt a lump in her throat.  In the emergency  department she was found to have a urinalysis that was abnormal and  consistent with a urinary tract infection.  The patient was given a dose  of Cipro and discharge plans were made.  Just prior to discharge, the  patient was sitting on the side of the bed in the emergency department  and had a syncopal episode that was witnessed by her son-in-law.  She  fell forward and hit the floor and lacerated her left forehead.  She did  not notice any chest pain prior to the syncope, but did notice  palpitations just before she passed out.  She tells me that she has had  2 episodes of what she calls tachycardia at home.  This is described as  a feeling of palpitations.  One of these episodes lasted for  approximately 7 hours.  The patient has recently been off of her  verapamil.   PAST MEDICAL HISTORY:  1. Hypertension.  2. Hyperlipidemia.  3. Paroxysmal atrial tachycardia.  4. Peripheral  vascular disease.  5. Uterine prolapse.   PAST SURGICAL HISTORY:  1. Cholecystectomy.  2. Tonsillectomy.  3. IUD removal.   ALLERGIES:  PENICILLIN, SULFA, IODINE, SHELLFISH, CHOCOLATE, CODEINE,  VALIUM.   HOME MEDICATIONS:  1. Atenolol 50 mg once daily.  2. Diovan/hydrochlorothiazide 160/12.5 mg once daily.  3. Pletal 50 mg in the morning 100 mg in the evening.  4. Alprazolam 0.25 mg four times daily.   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  She  does not use tobacco, alcohol or illicit drugs.   FAMILY HISTORY:  The patient's brother had a myocardial infarction.  There is no other family history of coronary artery disease.   REVIEW OF SYSTEMS:  As stated in history present illness.  Otherwise  negative.   PHYSICAL EXAMINATION:  VITALS:  Temperature 98.0, blood pressure 141/59,  pulse 74 and regular, respirations 16 and unlabored.  GENERAL:  Patient is alert and oriented x3 in no acute distress.  SKIN:  Warm and dry.  There is a laceration of the left forehead with  stitches that have been placed.  There is no active bleeding.  HEENT:  Normal mucous membranes are moist.  PSYCHIATRIC:  Mood and affect are appropriate.  MUSCULOSKELETAL:  Muscle strength and tone is normal in all extremities.  NEUROLOGICAL:  No focal neurological deficits.  NECK:  No JVD.  No carotid bruits.  No thyromegaly.  No lymphadenopathy.  LUNGS:  Clear to auscultation bilaterally without wheezes, rhonchi or  crackles noted.  CARDIOVASCULAR:  Regular rate and rhythm without murmurs, gallops or  rubs noted.  ABDOMEN:  Soft, nontender, nondistended.  Bowel sounds are present.  EXTREMITIES:  No evidence of edema.   DIAGNOSTIC STUDIES:  1. Laboratory values from admission show creatinine of 1.52, potassium      4.2, calcium 9.2.  TSH 1.96, T 401.07, hemoglobin 13, platelets      233,000.  CK 56, CK-MB 1.9, troponin 0.06.  2. A 12-lead EKG shows normal sinus rhythm with a  rate of 68 beats per      minute.  There are nonspecific ST and T-wave changes.  3. CT scan of the head shows chronic infarct in the right temporal      lobe with no acute changes.  There is no evidence of acute      bleeding.   ASSESSMENT/PLAN:  1. Syncope, etiology of this is uncertain at this time.  This could be      related to an arrhythmia or could be from dehydration.  I agree      with checking orthostatic vital signs and documenting this in the      progress notes.  I also agree with performing a 2-D echocardiogram      in the morning to assess her left ventricular systolic function and      also performing carotid artery Dopplers to rule out obstructive      carotid artery disease.  The patient should be ruled out with      serial cardiac enzymes overnight to exclude a myocardial      infarction.  She should be monitored closely on telemetry with      hydration overnight for possible dehydration.  2. Urinary tract infection per the primary team with appropriate      antibiotic coverage.  3. Paroxysmal atrial tachycardia.  It is unclear if her syncope was      related to an arrhythmia.  Her verapamil has been held recently.      We will review her telemetry overnight and will make further      recommendations in the morning.  We will consider restarting her      verapamil.  We will also consider placing an EP consult if there is      any evidence of supraventricular tachycardia.      Verne Carrow, MD  Electronically Signed     CM/MEDQ  D:  05/06/2009  T:  05/06/2009  Job:  [id number]

## 2011-03-02 NOTE — Op Note (Signed)
NAME:  Kayla Kent, Kayla Kent                ACCOUNT NO.:  [id number]   MEDICAL RECORD NO.:  [id number]          PATIENT TYPE:  INP   LOCATION:  3740                         FACILITY:  [facility]   PHYSICIAN:  Doylene Canning. 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   INTRODUCTION:  The patient is a 77 year old woman with a history of SVT  for over 50 years.  However, the episodes of increasing frequency and  severity lasting longer and longer and she was admitted to the hospital  with sustained prolonged SVT.  This is despite medical therapy with both  beta-blockers and calcium channel blockers.  She is now referred for  catheter ablation.   PROCEDURE:  After informed consent was obtained, the patient was taken  to the diagnostic EP lab in the fasting state.  After usual preparation  and draping, intravenous fentanyl and midazolam were given for sedation.  A 6-French hexapolar catheter was inserted percutaneously in the right  jugular vein and advanced under fluoroscopic guidance to the coronary  sinus.  A 5-French quadripolar catheter was inserted percutaneously into  the right femoral vein and advanced to the right ventricle.  A 5-French  quadripolar catheter was inserted percutaneously in the right femoral  vein and advanced under fluoroscopic guidance to the His bundle region.  After measurement of the basic intervals, rapid ventricular pacing was  carried out from the RV apex and stepwise decreased down to 340  milliseconds where VA Wenckebach was observed.  During rapid ventricular  pacing, the atrial activation sequence was midline and decremental.  Next, programmed ventricular stimulation was carried out from the RV  apex at a base drive cycle length of 213  milliseconds.  The S1-S2  interval was stepwise decreased from 440 milliseconds down to 300  milliseconds where the retrograde AV node ERP was observed.  Again  during programmed ventricular stimulation, the atrial activation was  midline and decremental.  There was no inducible SVT.  Next, programmed  atrial stimulation was carried out from the coronary sinus at a base  drive cycle length of 086 milliseconds.  The S1-S2 interval was stepwise  decreased down to 250 milliseconds where the AV node ERP was observed.  During programmed atrial stimulation, there were echo beats, but no  inducible SVT.  There was also AH jumps present.  Next, rapid atrial  pacing was carried out from the coronary sinus and stepwise as well as  the high right atrium and stepwise decreased down to 330 milliseconds.  During rapid atrial pacing, the PR interval was greater than the RR  interval and there was initially nonsustained SVT.  Initial mapping  demonstrated midline atrial activation.  However, because of the  nonsustained nature of the patient's SVT (she would typically have about  10-12 beats before stopping spontaneously).  Isoproterenol was infused  at rates of 1-4 mcg per minute.  With this  accomplished, additional  rapid atrial pacing resulted in the initiation of SVT.  This is a narrow  QRS tachycardia with a short RP interval.  The atrial activation was  again midline.  Ventricular pacing demonstrated VA-AV conduction  sequence.  PVCs could not be placed at the time of His bundle  refractures because of the patient's SVT with terminate spontaneously.  With all the above, however, diagnosis of AV node reentry tachycardia  was made and a 7-French quadripolar ablation catheter was advanced by  way the right femoral vein into the right atrium.  Mapping in Koch's  triangle was carried out.  This demonstrated a fairly large Koch's  triangle.  It was not particularly deep however.  Total of 3 RF energy   applications were delivered to sites 5 through 8 in Koch's triangle.  During this, there was accelerated junctional rhythm.  Following this,  additional rapid atrial pacing and programmed atrial stimulation were  carried out with and without isoproterenol and there was no evidence of  any residual slow pathway conduction.  There was also no inducible SVT.  At this point, the catheters were removed.  Hemostasis was assured and  the patient was returned to her room in satisfactory condition.   COMPLICATIONS:  There were no immediate procedure complications.   RESULTS:  A.  Baseline ECG.  The baseline ECG demonstrates sinus rhythm  with normal axis and intervals.  B.  Baseline intervals.  The HV interval was 35 milliseconds,the QRS  duration was 80 milliseconds, the sinus node cycle length was 900  milliseconds.  C.  Rapid ventricular pacing.  Rapid ventricular pacing was carried out  from the RV apex demonstrating a VA Wenckebach cycle length of 340  milliseconds.  During rapid ventricular pacing, the atrial activation  was midline and decremental and there was no inducible SVT.  D.  Programmed ventricular stimulation.  Programmed ventricular  stimulation was carried out from the RV apex at base drive cycle length  of 161 milliseconds.  The S1-S2 interval was stepwise decreased from 440  milliseconds down to 300 milliseconds where the retrograde AV node ERP  was observed.  During programmed ventricular stimulation, the atrial  activation was midline and decremental.  E.  Rapid atrial pacing.  Rapid atrial pacing was carried out from the  coronary sinus and high right atrium baseline cycle length of 600  milliseconds stepwise decreased down to 330 milliseconds where AV  Wenckebach was observed.  During rapid atrial pacing, there was  initially nonsustained SVT and with isoproterenol sustained SVT.  F.  Programmed atrial stimulation.  Programmed atrial stimulation was  carried from the  coronary sinus and the high right atrium at base drive  cycle length of 096 milliseconds as well as 400 milliseconds.  The S1-S2  interval was stepwise decreased down to the AV node ERP of 250  milliseconds.  During programmed atrial stimulation, there were AH jumps  and echo beats, but no inducible SVT.  G.  Arrhythmia was observed.  1. AV node reentrant tachycardia initiation was with rapid atrial      pacing with and without isoproterenol, then duration was sustained      as well as nonsustained, the cycle length was 330 milliseconds.      a.     Mapping.  Mapping of Koch's triangle demonstrated a somewhat       larger than expected size with normal orientation.      b.     RF energy  application.  Total of 3 RF energy applications       were delivered to sites 5 through 8 in Koch's triangle resulting       in accelerated junctional rhythm and rate in the SVT not       inducible.   OCCLUSION:  Study demonstrates successful electrophysiologic study and  RF catheter ablation of typical AV node reentrant tachycardia with a  total of 3 RF energy applications delivered to sites 5 through 8 in  Koch's triangle rendering the tachycardia not inducible.      Doylene Canning. Ladona Ridgel, MD  Electronically Signed     GWT/MEDQ  D:  06/11/2009  T:  06/12/2009  Job:  [id number]   cc:   Rosalyn Gess. Norins, MD

## 2011-03-02 NOTE — H&P (Signed)
Kayla Kent, Kayla Kent                ACCOUNT NO.:  [id number]   MEDICAL RECORD NO.:  [id number]          PATIENT TYPE:  INP   LOCATION:  0113                         FACILITY:  [facility], [place]   PHYSICIAN:  Kela Millin, M.D.DATE OF BIRTH:  June 20, 1932   DATE OF ADMISSION:  05/06/2009  DATE OF DISCHARGE:                              HISTORY & PHYSICAL   PRIMARY CARE PHYSICIAN:  Rosalyn Gess. Norins, M.D.   CARDIOLOGIST:  Verne Carrow, M.D.   CHIEF COMPLAINT:  Generalized malaise and syncope in the ED.   HISTORY OF PRESENT ILLNESS:  The patient is a 77 year old white female  with past medical history significant for paroxysmal atrial tachycardia,  hypertension, hyperlipidemia, history of mitral valve prolapse who  presents with the above complaints.  She states that she has not been  feeling well for the past 4 weeks.  She saw Dr. Debby Bud 3 weeks ago after  a 1-week history of decreased energy levels, generalized malaise, poor  appetite.  She had been monitoring her blood pressures at home with the  home device she has, and her blood pressures were running low - systolic  in the 54U.  She reports that she also had been having nausea, vomiting  and diarrhea.  When she saw Dr. Debby Bud at that time she was diagnosed  with viral gastroenteritis and was given a prescription for  promethazine.  About 2 weeks later, she still was not feeling well and  she called and was asked to come in and some lab work was done.  She was  also at that time instructed to stop the verapamil and was given  Benefiber.  She states that since she started taking the Benefiber, her  diarrhea has resolved.  She states that for the most part she had been  having nausea with gagging and not really vomiting much.  She also  reports that she had palpitations on two different days recently (the  15th and 18th), but they resolved spontaneously.  The patient reports  that she was awakened at about 2:00 a.m. with a feeling  of fluttering  in her upper chest area, and subsequently when she woke up this morning  she just did not feel well and so she called EMS.  She denies chest  pain, shortness of breath, cough, leg swelling, melena and no  hematochezia.  She denies headaches and no focal weakness.  She admits  to dysuria.  She was seen in the ED, and urinalysis revealed trace  leukocyte esterase with 3-6 WBCs, many bacteria, and she was diagnosed  with urinary tract infection and given a prescription for Cipro.  Per  nursing staff, when she sat up on the gurney she had a syncopal episode  and fell and sustained a laceration to her left frontal scalp area.  Per  nursing staff, she was unconscious with this episode for a few minutes.  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  The  laceration was sutured per ED physician, and  the patient is admitted for  further evaluation and management.  Per the ED records, the patient's  blood pressure when EMS arrived at her home this morning was 130/80 with  a pulse of 80.   PAST MEDICAL HISTORY:  1. As above.  2. History of uterine prolapse.   ALLERGIES:  PENICILLIN, SULFA, MERCURY AND CONTRAST MEDIA.   SOCIAL HISTORY:  She quit tobacco over 14 years ago.  She denies  alcohol.  She lives alone.   FAMILY HISTORY:  Reviewed and noncontributory to current illness.   MEDICATIONS:  1. Alprazolam 0.25 mg half a tablet t.i.d. and 1 tablet q.h.s.  2. Phenergan 12.5 mg q.6 h. p.r.n.  3. Pletal 100 mg half tablet q.a.m. and 1 tablet q.p.m.  4. Diovan/HCT 160/12.5 mg daily.  5. Atenolol 50 mg p.o. daily.   Please note that the patient has been off verapamil since April 29, 2009.   REVIEW OF SYSTEMS:  As per HPI, other review of systems negative.   PHYSICAL EXAMINATION:  GENERAL:  The patient is a pleasant elderly white  female.  She is alert and oriented, in no apparent  distress.  VITAL SIGNS:  Her temperature is 98 with a blood pressure of 141/59,  pulse of 79, respiratory rate of 16, O2 sat of 96%.  HEENT:  EOMI.  She has sutures on the left frontal area of the scalp.  Normocephalic.  Sclerae anicteric.  Slightly dry mucous membranes.  NECK:  Supple, no adenopathy, no thyromegaly and no JVD.  LUNGS:  Clear to auscultation bilaterally.  No crackles or wheezes.  CARDIOVASCULAR:  Regular rate and rhythm.  Normal S1-S2.  ABDOMEN:  Soft, bowel sounds present, nontender, nondistended.  No  organomegaly and no masses palpable.  EXTREMITIES:  No cyanosis and no edema.  NEURO:  Alert and oriented x3.  Cranial nerves II-XII grossly intact.  Strength 5/5 and symmetric.  Nonfocal exam.   LABORATORY DATA:  As per HPI.  CT scan of head with chronic infarct in  the right temporal lobe, no acute abnormality.  The white cell count is  8.7, hemoglobin 13.3, hematocrit of 39.7, platelet count of 233, total  CK is 56, CK-MB is 1.9.  Sodium is 138, potassium 4.2, chloride is 104,  CO2 of 25, glucose 102, BUN of 22, creatinine of 1.52, calcium is 9.2,  total protein is 7.3, albumin is 3.2, AST is 19, ALT is 11.  Urinalysis:  Nitrite positive, trace leukocyte esterase, 3-6 WBCs.  Glucose is 109.  TSH is 1.986, free T4 is 1.07.  EKG:  Normal sinus rhythm with no acute  ischemic changes, her rate is 68.   ASSESSMENT AND PLAN:  1. Syncope.  As discussed above, we will obtain orthostatic vital      signs, hydrate.  Will monitor on telemetry, obtain cardiac enzymes,      a 2-D echo, carotid Doppler ultrasound and consult cardiology.  She      is followed by Dr. Clifton James.  Her EKG shows normal sinus rhythm at      this time.  We will follow.  Her hemoglobin is stable, with no      evidence of bleeding.  2. Urinary tract infection.  Will obtain urine cultures, empiric      antibiotics and follow.  3. Renal insufficiency.  It is noted that the patient's creatinine on      April 29, 2009 was 1.9 and is improved today to 1.52.  Will hold  Diovan/HCTZ for now.  Hydrate, follow recheck and further evaluate      as appropriate if not continuing to improve.  4. Paroxysmal atrial tachycardia.  We will monitor on telemetry.  As      noted above, her verapamil has been held since April 29, 2009.  Will      leave off the verapamil for now, continue atenolol, monitor on      telemetry as already mentioned and consult cardiology for further      recommendations.  She is currently normal sinus rhythm.  5. Hypertension.  Medications as already discussed above, monitor      blood pressures and further treat accordingly.  6. History of anxiety.  Continue outpatient medications.  7. History of peripheral vascular disease.  On Pletal, continue.  8. History of hyperlipidemia.  Currently on no meds, will recheck      fasting lipid profile and follow and further manage accordingly.  9. Scalp laceration.  Status post suturing in the ED, follow.      Kela Millin, M.D.  Electronically Signed     ACV/MEDQ  D:  05/06/2009  T:  05/06/2009  Job:  [id number]   cc:   Rosalyn Gess. Norins, MD  [address]  [place]  [place] [address]   Verne Carrow, MD  [address]  [place] [address]

## 2011-03-02 NOTE — Discharge Summary (Signed)
NAME:  Kayla, POULLARD                ACCOUNT NO.:  [id number]   MEDICAL RECORD NO.:  [id number]          PATIENT TYPE:  INP   LOCATION:  3740                         FACILITY:  [facility]   PHYSICIAN:  Doylene Canning. Ladona Ridgel, MD    DATE OF BIRTH:  [birth year]   DATE OF ADMISSION:  06/10/2009  DATE OF DISCHARGE:  06/12/2009                               DISCHARGE SUMMARY   Time for this dictation, examination and explanation with the patient  greater than 45 minutes.  This patient has several allergies maybe more  than several.  They are for example PENICILLIN, SULFA, IODINE,  SHELLFISH, CHOCOLATE, CODEINE AND VALIUM.   FINAL DIAGNOSES:  1. Recurrent supraventricular tachycardia.      a.     Symptoms are fatigue/palpitations/presyncope/weakness/gait       unsteadiness.      b.     Atrioventricular nodal reentry tachycardia with breakthrough       on calcium channel blocker and beta blocker.  2. Discharging day 1 status post electrophysiology study.      a.     Inducible atrioventricular nodal reentry tachycardia.      b.     Slow pathway modification, no re-inducibility.  3. Nausea day #1 status post ablation secondary to fentanyl.      a.     Patient received intravenous Zofran with resolution.   SECONDARY DIAGNOSES:  1. Hypertension.  2. Dyslipidemia.  3. Uterine prolapse.  4. Status post cholecystectomy.  5. Anxiety.  6. Echocardiogram May 07, 2009 ejection fraction of 65%.  No mitral      regurgitation.  No tricuspid regurgitation.   PROCEDURE:  June 11, 2009 electrophysiology study with inducible  atrioventricular nodal reentry tachycardia, successful radiofrequency  catheter ablation with modification of the slow pathway.  No re-  inducibility.  The patient in sinus rhythm at the time of discharge and  asymptomatic.   BRIEF HISTORY:  Kayla Kent is a 77 year old female.  She has a history  of tachy arrhythmias.  She has been seen in consultation for ablation  therapy and this was  scheduled for 2 weeks after the consultation which  was on June 10, 2009, however, upon returning home, she felt tired and  weak and presyncopal and returned to the emergency room where it was  thought that she would require adenosine challenge however, the patient  did convert to sinus rhythm.  The patient's ablation has been moved up  and was therefore scheduled for August 11, 2009.  The patient had  inducible AVNRT.  This was successfully ablated by means of a slow  pathway modification by Dr. Lewayne Bunting.  The patient had no  postprocedural complications.  Discharging postprocedure day #1, her  medications as follows.  1. Advil 200 mg twice daily as needed.  2. Alprazolam 0.25 mg four times daily as needed.  3. Atenolol 50 mg daily.  4. Diltiazem 180 mg every morning.  5. Phenergan 25 mg every 6 hours as needed.  6. Pletal 100 mg in the evening and 50 mg in the morning.   The patient has  been scheduled to see Dr. Ladona Ridgel in followup on  Thursday, July 10, 2009 at 3:30.  She was asked to lift nothing  strenuously for the next 2 weeks.   LABORATORY STUDIES:  On June 11, 2009, serum electrolytes sodium 137,  potassium 3.7, chloride 109, carbonate 25, glucose at 86, BUN is 9,  creatinine 1.12.  Complete blood count white cells 6.2, hemoglobin 11.4,  hematocrit 33.6, platelets of 199.  She had troponin I studies on June 10, 2009, they are 0.08, 0.06 and 0.08.  The patient has a TSH from March 19, 2009 at 1.986.      Maple Mirza, PA      Doylene Canning. Ladona Ridgel, MD  Electronically Signed    GM/MEDQ  D:  06/12/2009  T:  06/13/2009  Job:  [id number]   cc:   Rosalyn Gess. Norins, MD

## 2011-03-02 NOTE — Assessment & Plan Note (Signed)
[facility]                            CARDIOLOGY OFFICE NOTE   SHAMARI, TROSTEL                       MRN:          [id number]  DATE:08/09/2007                            DOB:          01-20-1932    PRIMARY CARE PHYSICIAN:  Illene Regulus, M.D.   REASON FOR VISIT:  Follow up paroxysmal atrial tachycardia.   HISTORY OF PRESENT ILLNESS:  I saw Ms. Dortch back in September 2007.  She reports doing overall well.  She did have one episode of rapid  palpitations while arranging some wood in a woodpile outdoors several  months ago back in the spring.  She states that these palpitations  lasted for less than 10 minutes and she has had no difficulty since that  time.  She is not experiencing any exertional chest pain or limiting  breathlessness.  She does have some interest in joining one of the [facility]  health programs and I encouraged her about this.  Her electrocardiogram  today shows sinus bradycardia at 58 beats per minute with otherwise  nonspecific T-wave changes.  Today I reviewed her cardiac medications.  We also discussed her most recent echocardiogram in 2006 that did not  demonstrate any significant prolapse with only trace mitral  regurgitation.  At this point she does not require endocarditis  prophylaxis with dental procedures.  I reviewed this with her.   ALLERGIES:  1. PENICILLIN.  2. SULFA DRUGS.  3. IODINE.  4. SHELLFISH.  5. CHOCOLATE.  6. CODEINE.  7. VALIUM.  8. Intolerance to high-dose beta blockers.   PRESENT MEDICATIONS:  1. Verapamil SR 240 mg p.o. daily.  2. Atenolol 50 mg p.o. daily.  3. Diovan HCT 160/12.5 mg p.o. daily.  4. Alprazolam 0.25 mg one-half tablet p.o. daily.  5. Alprazolam 0.25 mg one to two tablets p.o. q.i.d. p.r.n.   REVIEW OF SYSTEMS:  As described in the history of present illness.   EXAMINATION:  Blood pressure 150/70, heart rate 58, weight 162 pounds,  up from 154.  An overweight woman in no  acute distress.  NECK:  Reveals no elevated jugular venous pressure or loud bruits, no  thyromegaly is noted.  LUNGS:  Clear without labored breathing.  CARDIAC:  Reveals a regular rate and rhythm.  No mid systolic click.  No  S3 gallop or loud murmur.  ABDOMEN:  Soft without bruits, normal active bowel sounds.  EXTREMITIES:  No significant pitting edema, some superficial  varicosities noted.   IMPRESSION AND RECOMMENDATIONS:  1. History of paroxysmal atrial tachycardia.  The patient had some      symptoms several months ago of rapid palpitations although is      typically not all troubled by this on any regular basis.  I did not      make any medication changes today and we will in general continue      to observe her on an annual basis unless she has progressive      symptomatology.  2. Other health maintenance per Dr. Debby Bud.     Jonelle Sidle, MD  Electronically Signed  SGM/MedQ  DD: 08/09/2007  DT: 08/10/2007  Job #: [id number]   cc:   Rosalyn Gess. Norins, MD

## 2011-03-02 NOTE — Assessment & Plan Note (Signed)
[facility]                            CARDIOLOGY OFFICE NOTE   Kayla Kent, Kayla Kent                       MRN:          [id number]  DATE:09/04/2008                            DOB:          1932/01/04    PRIMARY CARE PHYSICIAN:  Rosalyn Gess. Norins, MD   HISTORY OF PRESENT ILLNESS:  Kayla Kent is a pleasant 77 year old  Caucasian female with a past medical history significant for  hypertension, hyperlipidemia, and paroxysmal atrial tachycardia, who was  previously followed in this office by Dr. Nona Dell.  Kayla Kent  was initially seen in my clinic on August 15, 2008, to establish  cardiology care.  She told me at that time that she had been doing well  from a cardiovascular standpoint and described no episodes of chest  pain, shortness of breath, diaphoresis, nausea, vomiting, dizziness,  near syncope, syncope, orthopnea, PND, or lower extremity edema.  She  did, however, tell me at that time that she had been having cramping in  her left calf muscle while mowing the grass.  The symptoms occurred  after 15 minutes of exertion.  It seemed to resolve when she rested.  She did not describe any pain in her right lower leg.  At that time, she  had noticed no swelling in her left leg; however, her son is a health  care professional, and he was concerned that she may have a DVT in her  left leg.  After some discussion with the patient, we elected to proceed  with checking a D-dimer to rule out DVT in the left leg.  I felt it  would be most beneficial to perform arterial Dopplers of the lower  extremities to rule out obstructive arterial disease as her symptoms  seem more consistent with claudication and with venous disease.  The D-  dimer was slightly elevated and because of this, she went for a Doppler  study of the venous system of the left lower extremity.  This showed no  evidence of DVT.  She then returned several days later and had  noninvasive arterial Dopplers of the bilateral lower extremities.  The  results of this testing is outlined in detail below.  She returns today  to review the results of her Doppler testing.   PAST MEDICAL HISTORY:  Unchanged and includes hypertension,  hyperlipidemia, paroxysmal atrial tachycardia, mitral valve prolapse,  and uterine prolapse.   CURRENT MEDICATIONS:  1. Verapamil extended release 240 mg once daily.  2. Atenolol 50 mg once daily.  3. Diovan/HCTZ 160 mg/12.5 mg once daily.  4. Alprazolam 0.25 mg 1/2 tablet 4 times daily.   REVIEW OF SYSTEMS:  As stated in the history of present illness, is  otherwise negative.   PHYSICAL EXAMINATION:  VITAL SIGNS:  Blood pressure 120/60, pulse 72 and  regular, and respirations 12 and nonlabored.  GENERAL:  She is a pleasant elderly Caucasian female, in no acute  distress.  She is alert and oriented x3.  NEUROLOGIC:  No focal neurological deficits.  SKIN:  Warm and dry.  NECK:  No JVD.  No  carotid bruits.  No thyromegaly.  No lymphadenopathy.  LUNGS:  Clear to auscultation bilaterally without wheezes, rhonchi, or  crackles noted.  CARDIOVASCULAR:  Regular rate and rhythm.  ABDOMEN:  Soft and nontender.  Bowel sounds are present.  EXTREMITIES:  There is no evidence of lower extremity edema.  The  patient has spider veins noted over both lower extremities with several  areas of varicosities noted.  Radial pulses are 2+ bilaterally.  Bilateral dorsalis pedis and posterior tibial artery pulses are 1+.  There are no ulcerations noted over the lower extremities.   DIAGNOSTIC STUDIES:  1. Arterial Dopplers of the bilateral lower extremities showed an ABI      of 1.0 in the right lower extremity and 0.52 in the left lower      extremity.  Duplex imaging revealed brisk biphasic flow in the      common femoral artery bilaterally, suggesting mild proximal      disease.  The right superficial femoral artery has smooth vessel      walls with  collaterals noted throughout the course.  In the mid-to-      distal thigh, 8 cm proximal to the patella, there is a 3-4 cm long      area of significant stenosis in the right superficial femoral      artery with severely elevated velocities and poststenotic      dilatation turbulence.  The popliteal artery on the right had      biphasic flow.  The right anterior tibial artery and posterior      tibial artery waveforms were brisk and biphasic.  On the left, the      proximal superficial femoral artery has smooth vessel walls, but      the flow was biphasic and dampen.  The left SFA appears to have a 5-      6 cm long area of occlusion with collaterals providing      reconstitution in the distal thigh.  The left anterior tibial      artery has a monophasic waveform.  This is suggestive of possible      mild aortoiliac arterial occlusive disease.  This is also      suggestive of moderate-to-severe obstruction of the distal right      superficial femoral artery and a significant obstructive lesion of      the left superficial femoral artery.  2. Venous duplex imaging of the left lower extremities shows no      evidence of DVT.   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  I have discussed with her potential treatment  options, which include medical therapy with a walking program, surgical  revascularization, or attempted percutaneous revascularization.  I think  at this time it is reasonable to proceed with CT angiography with runoff  of the lower extremities.  I would like to start the patient on Pletal  50 mg twice daily.  If she tolerates this medication, we will  increase  the dosage to 100 mg twice daily.  I have discussed with her the  importance of a strict walking program.  She is agreed to walk at least  1 hour daily.  She is  instructed that if she has onset of her  claudication after 50-20 minutes, then she should rest and then resume  walking when her calf pain is better.  I will review the results of her  CT angiogram and will discuss this over the phone with the patient.  I  will see her back in 6 months and hope that she has had some symptomatic  improvement in her left lower extremity claudication with the medical  therapy as well as the walking program.  I have also told the patient  that if she begins to  have more severe pain in the left lower extremity or pain that is  debilitating, we could consider proceeding directly to percutaneous  intervention of this area.     Verne Carrow, MD  Electronically Signed    CM/MedQ  DD: 09/04/2008  DT: 09/05/2008  Job #: [id number]   cc:   Rosalyn Gess. Norins, MD

## 2011-03-02 NOTE — H&P (Signed)
NAME:  Kayla Kent, Kayla Kent NO.:  [id number]   MEDICAL RECORD NO.:  [id number]          PATIENT TYPE:  EMS   LOCATION:  ED                           FACILITY:  [facility]   PHYSICIAN:  Sarajane Marek, MD     DATE OF BIRTH:  Oct 01, 1932   DATE OF ADMISSION:  06/09/2009  DATE OF DISCHARGE:                              HISTORY & PHYSICAL   __________   CHIEF COMPLAINT:  Presyncope.   HISTORY OF PRESENT ILLNESS:  Ms. Wissink is a 77 year old female with a  history of paroxysmal atrial tachycardia presenting to the emergency  department for tachycardia and presyncope today.  She was seen today in  consultation by Dr. Johney Frame for consideration of an SVT ablation, which  was scheduled for 2 weeks from now.  When she returned home she felt  very tired and at 11:30, felt the need to go to sleep, but was able to  make lunch and later began to feel extreme fatigue and weakness, as well  as presyncope and was unsteady on her feet.  She did not ever have chest  pain, shortness of breath or frank syncope, however, there is  significant concern between her and her children as she did note that  she had a rapid heart rate.  She determined that she was having a  tachycardia and that it did not break with vagal maneuvers at home,  therefore she presented to the emergency department.  While in the  emergency department they placed her on a diltiazem drip at 10 mg an  hour with no change in her heart rate.  There was then a discussion of  pushing adenosine, however, at that point the patient did break and  returned to normal sinus rhythm.   ALLERGIES:  1. PENICILLIN.  2. SULFA.  3. IODINE.  4. SHELLFISH.  5. CHOCOLATE.  6. CODEINE.  7. VALIUM.   MEDICATIONS:  1. Alprazolam 0.25 mg q.i.d. p.r.n.  2. Phenergan 4.5 mg q.6 h., p.r.n.  3. Pletal 50 mg q.a.m. and 100 mg q.p.m.  4. Cartia XT 180 mg daily.  5. Ambien 5 mg nightly p.r.n.  6. Atenolol 50 mg daily.   PAST MEDICAL HISTORY:  1. Paroxysmal atrial tachycardia.  2. Hypertension.  3. Hyperlipidemia.  4. Peripheral vascular disease.  5. Anxiety.  6. Uterine prolapse  7. Status post cholecystectomy.  8. Status post tonsillectomy  9. Status post IUD removal.   SOCIAL HISTORY:  She lives alone.  She quit tobacco use 14 years ago.  Denies alcohol.   FAMILY HISTORY:  Unremarkable for cardiac disease.   REVIEW OF SYSTEMS:  Review of systems was performed and negative except  as per HPI.   PHYSICAL EXAMINATION:  VITAL SIGNS:  Temperature 97.4, heart rate on  presentation 159, now 91, blood pressure on presentation 96/51, now  134/60, respiratory rate 20, oxygen saturation 95% on room air.  GENERAL:  This patient is in no acute distress.  HEENT:  Normocephalic, atraumatic.  Pupils equal, round and reactive to  light.  Extraocular muscles intact.  Sclerae are clear.  Oropharynx  is  without erythema, exudate or mass.  NECK:  Supple without lymphadenopathy.  No thyromegaly, bruit or JVD.  CARDIOVASCULAR:  Regular rate and rhythm with normal S1-S2.  No S3-S4.  No murmur, gallop or rub.  Pulses are 2+ and symmetric x4 extremities.  Normal carotid upstroke.  No bruit.  LUNGS:  Clear bilaterally without increased work of breathing.  SKIN:  No rash or lesions.  GU:  deferred.  ABDOMEN:  Soft, nontender, normoactive bowel sounds.  No rebound,  guarding, no hepatosplenomegaly.  GU/RECTAL:  Deferred.  EXTREMITIES:  No clubbing, cyanosis, edema, rash, lesion or petechiae.  MUSCULOSKELETAL:  No joint deformity or effusion.  NEURO:  Alert and oriented x3.  Cranial nerves II-XII grossly intact.   Two view chest x-ray demonstrates perihilar congestion, no overt edema  or acute infiltrate.  Echocardiogram from May 07, 2009, with left  ventricular ejection fraction 65%.  Normal wall motion, no significant  valvular regurgitation.  First EKG obtained with a short RP tachycardia,  narrow complex at a rate of 160 with a normal  axis, consistent with SVT,  nonspecific ST changes, no hypertrophy.  Second EKG is normal sinus  rhythm with a rate of 87, high lateral T-wave inversions with QTC of 46  milliseconds.   LABORATORY DATA:  White blood cell count 11.6, hemoglobin 13, hematocrit  40.9, platelets are 266.  Sodium 144, potassium 4.1, chloride 111,  bicarb 22, BUN 21, creatinine 1.5 and glucose 145.  PT 7.9, INR 1.1.  PT  29.7.  Two sets of cardiac biomarkers and now negative.   ASSESSMENT/PLAN:  This is a 77 year old white female with history of  paroxysmal atrial tachycardia presenting with supraventricular  tachycardia that broke in the emergency department just with diltiazem  drip.  The patient is currently in normal sinus rhythm, however, due to  her age and hesitancy to return home, she will be admitted for  monitoring overnight.  Unfortunately due to episodes of hypotension, she  has not tolerated anything more than Cartia XT and atenolol and thus AV  nodal blocking agents do not seem to be able to be increased.  As such,  we will discuss with Dr. Johney Frame potentially moving up the date of her  ablation.  We will also discontinue her diltiazem drip at this time as  she is currently in sinus rhythm and is scheduled to get Cartia and  atenolol at 7 o'clock in the morning.  We will monitor on telemetry, get serial EKGs and serial cardiac  biomarkers.  Her TSH was recently checked and was normal, so we will not  repeat.  We will keep K greater than 4, magnesium greater than 2.  Additionally, given her elevated white count, we will check urinalysis  and urine culture to rule out infection prior to planned procedure.      Sarajane Marek, MD  Electronically Signed     HH/MEDQ  D:  06/10/2009  T:  06/10/2009  Job:  [id number]

## 2011-03-02 NOTE — Assessment & Plan Note (Signed)
Kayla Kent                            CARDIOLOGY OFFICE NOTE   Kayla Kent, Kayla Kent                       MRN:          [id number]  DATE:08/15/2008                            DOB:          1931-10-29    PRIMARY CARE PHYSICIAN:  Kayla Gess. Norins, MD   HISTORY OF PRESENT ILLNESS:  Kayla Kent is a pleasant 77 year old  Caucasian female with a past medical history significant for  hypertension, hyperlipidemia, and paroxysmal atrial tachycardia, who was  previously followed in this office by Dr. Nona Dell.  Dr. Diona Browner  is no longer seeing patients in this clinic and because of this  patient's here to establish care with me today.  She tells me that she  has been doing well overall.  She has been very active around her house  and yard and describes no episodes of chest pain, shortness of breath,  diaphoresis, nausea, vomiting, dizziness, near syncope, syncope,  orthopnea, PND, or lower extremity edema.  She has had several episodes  of left calf pain while pushing her lawn mower this summer.  This  essentially occurred every time she mowed the grass.  She tells me that  after pushing her lawn mower for 15 minutes, she would have the onset of  severe cramping in the left calf muscle.  This did not occur in the  right leg at all.  She has noticed no swelling in her left leg.  The  pain in the calf muscle was generally relieved with rest.  She has no  other complaints at this time.   PAST MEDICAL HISTORY:  1. Hypertension.  2. Hyperlipidemia.  3. Paroxysmal atrial tachycardia with prior cardioversion in [place],      [place] in the 1990s.  4. Documented history of mitral valve prolapse by echocardiogram 10      years ago.  However, her most recent echocardiogram 2 years ago      showed no evidence of mitral valve prolapse.  5. Uterine prolapse.   PAST SURGICAL HISTORY:  1. Cholecystectomy.  2. Tonsillectomy.  3. IUD removal.   ALLERGIES:   PENICILLIN, SULFA, IODINE, SHELLFISH, CHOCOLATE, CODEINE,  VALIUM, and intolerance to high dose of beta-blockers.   CURRENT MEDICATIONS:  1. Verapamil extended-release 240 mg once daily.  2. Atenolol 50 mg once daily.  3. Diovan/hydrochlorothiazide 160/12.5 mg once daily.  4. Alprazolam, 0.25 mg 1/2 tablet once daily and alprazolam p.r.n.   SOCIAL HISTORY:  The patient is a retired Engineer, site and a widow.  She has 2 children.  Her son is paramedic who lives in [place], [place]  and is very involved in her life.  She also has a daughter who she is  very close with.  She tells me that she smoked for several years as a  young adult, but has not smoked in the last 45 years.  She denies the  use of alcohol or illicit drugs.   FAMILY HISTORY:  The patient's brother had a myocardial infarction and  had angioplasty with stent placement.  He is alive and well.  There is  no other family history of coronary artery disease.   REVIEW OF SYSTEMS:  As stated in history of present illness is otherwise  negative.   PHYSICAL EXAMINATION:  VITAL SIGNS:  Blood pressure 141/53, pulse 68 and  regular, respirations 12 and unlabored.  GENERAL:  She is a pleasant, well-appearing 77 year old Caucasian female  in no acute distress.  She is alert and oriented x3.  NECK:  No JVD.  No  carotid bruits.  No thyromegaly.  No lymphadenopathy.  SKIN:  Warm and dry.  PSYCHIATRIC:  Mood and affect are appropriate.  NEUROLOGIC:  No focal neurological deficits.  MUSCULOSKELETAL:  Muscle strength and tone is normal.  LUNGS:  Clear to auscultation bilaterally without wheezes, rhonchi, or  crackles noted.  CARDIOVASCULAR:  Regular rate and rhythm without murmurs, gallops, or  rubs noted.  ABDOMEN:  Soft, nontender, and nondistended.  Bowel sounds are present.  EXTREMITIES:  No evidence of lower extremity edema.  Pulses are 2+ in  both lower extremities.  Radial pulses are 2+ bilaterally.  There is no  tenderness  on the left or right calf muscle with palpation.  Homans sign  is negative in both lower extremities.  No ulcerations are noted over  the lower extremities.   DIAGNOSTIC STUDIES:  A 12-lead EKG shows sinus bradycardia with a  ventricular rate of 57 beats per minute.  There are no ischemic changes  noted on this EKG.   ASSESSMENT AND PLAN:  This is a pleasant 77 year old Caucasian female  with past medical history significant for paroxysmal atrial tachycardia,  hypertension, and hyperlipidemia, who presents for routine Cardiology  followup.  In regards to her atrial tachycardia, she does not describe  any episodes that are suggestive of a recent recurrence in this problem.  Her blood pressure is slightly elevated at this visit today, however,  she assures me that it has been well controlled at all other checks.  I  will make no medication changes at the current time.  In regards to her  left calf pain with ambulation, I think, it is unlikely that she has  significant occlusive disease in the lower extremities.  I will at the  patient's insistence proceed with a workup to include lower extremity  arterial Doppler studies with an ankle-brachial index.  I will also  check a metabolic profile to look at her electrolytes and we will check  a D-dimer to rule out the possibility  of a DVT. If the d-dimer is positive, we will proceed with a venous  dopple to rule out DVT. I will plan on seeing her back in several weeks  to review the results of her tests.     Verne Carrow, MD  Electronically Signed    CM/MedQ  DD: 08/15/2008  DT: 08/16/2008  Job #: [id number]   cc:   Kayla Gess. Norins, MD

## 2011-03-05 ENCOUNTER — Other Ambulatory Visit: Payer: Self-pay | Admitting: Internal Medicine

## 2011-03-05 NOTE — Assessment & Plan Note (Signed)
[facility]                              CARDIOLOGY OFFICE NOTE   KELSA, JAWOROWSKI                       MRN:          [id number]  DATE:07/13/2006                            DOB:          [birth year]    PRIMARY CARE PHYSICIAN:  Rosalyn Gess. Norins, MD.   REASON FOR VISIT:  Followup prior history of paroxysmal atrial tachycardia  and redundant mitral valve.   HISTORY OF PRESENT ILLNESS:  Ms. Riedesel returns having last been seen in  April of 2006. We proceeded with a followup echocardiogram in May of that  year to followup on previously described mild mitral valve prolapse. This  study did not reveal any significant prolapse and only trace mitral and  tricuspid regurgitation. She has not experienced any significant  palpitations and has been on the medications outlined below. Her  electrocardiogram today showed sinus bradycardia at 56 beats per minute with  nonspecific T wave changes. She has been trying to stay active doing some  walking and watching her diet. She tells me that her husband passed away  back in Nov 06, 2022.   ALLERGIES:  Allergies are multiple and include PENICILLIN, SULFA DRUGS,  IODINE, SHELLFISH, CHOCOLATE, high dose BETA BLOCKER, VALIUM and CODEINE.   CURRENT MEDICATIONS:  1. Verapamil ER 240 mg p.o. daily.  2. Atenolol 50 mg p.o. daily.  3. Diovan HCT 160/12.5 mg p.o. daily.  4. Alprazolam 0.25 mg 1/2 tablet p.o. p.r.n.   REVIEW OF SYSTEMS:  As described in the history of present illness. She has  had no orthopnea, PND, exertional chest pain or syncope.   PHYSICAL EXAMINATION:  VITAL SIGNS:  Blood pressure is 134/62, heart rate is  56, weight is 154 pounds which is relatively stable.  GENERAL:  The patient is comfortable under no acute distress.  NECK:  Examination of the neck reveals no elevated jugular venous pressures,  without bruits.  LUNGS:  Clear without labored breathing.  CARDIAC:  Reveals a regular rate and  rhythm without loud murmur or gallop.  EXTREMITIES:  Show no pitting edema.   IMPRESSION/RECOMMENDATIONS:  1. Reported history of paroxysmal atrial tachycardia although with no      significant palpitations and tolerating present medical regimen well. I      will not make any specific changes and suggest observation and annual      followup.  2. Reported previous history of mild mitral valve prolapse although not      specifically described      by her last echocardiogram and associated with only trace mitral      regurgitation. I would not pursue any additional evaluation at this      point.       Jonelle Sidle, MD     SGM/MedQ  DD:  07/13/2006  DT:  07/15/2006  Job #:  [id number]   cc:   Rosalyn Gess. Norins, MD

## 2011-03-05 NOTE — Op Note (Signed)
NAME:  Kayla Kent, Kayla Kent                ACCOUNT NO.:  [id number]   MEDICAL RECORD NO.:  [id number]          PATIENT TYPE:  AMB   LOCATION:  DAY                          FACILITY:  [facility]   PHYSICIAN:  Anselm Pancoast. Weatherly, M.D.DATE OF BIRTH:  1931-10-30   DATE OF PROCEDURE:  12/30/2004  DATE OF DISCHARGE:                                 OPERATIVE REPORT   PREOPERATIVE DIAGNOSIS:  Acute cholecystitis with stones.   POSTOPERATIVE DIAGNOSIS:  Fat necrosis omentum with chronic cholecystitis.   OPERATION:  1.  Laparoscopic cholecystectomy and cholangiogram.  2.  Small segment of omentectomy.  3.  Repair of fascial defect of umbilicus.   ANESTHESIA:  General.   SURGEON:  Anselm Pancoast. Zachery Dakins, M.D.   ASSISTANT:  Leonie Man, M.D.   HISTORY:  Kayla Kent is a 77 year old Caucasian female who Dr. Artist Pais from  the [facility] called me on Monday when I was on call that she had  had severe onset of right upper quadrant pain on Saturday night and had an  ultrasound on Monday morning after seeing Dr. Artist Pais.  Liver function studies  were normal and the ultrasound showed chronic cholecystitis with a positive  Murphy sign but not a thickened gallbladder. The patient's husband has had a  stroke and she could not come to the emergency room.  I talked to her by  phone and saw her in the office on Tuesday and she underwent examination.  She was still tender in the right upper quadrant, was not febrile.  We  discussed about proceeding on with an urgent laparoscopic cholecystectomy  today.  The patient was in agreement with this.  She has a history of  hypertension and otherwise is in her usual state of health.   Preoperatively, a white count was normal.  Liver function studies were  repeated and were normal.  She was given 400 mg Cipro as she is allergic to  penicillin.  She has a fascial defect of the umbilicus about the size of a  nickel but the umbilical hernia has never really been  symptomatic.   DESCRIPTION OF PROCEDURE:  After induction of anesthesia, endotracheal tube  placed, an oral tube into the stomach, and then the abdomen was prepped with  Hibiclens, being allergic to Betadine.  Then the abdomen was prepped and  draped in a sterile manner.  A small incision was made below the umbilicus.  The peritoneum was separated from the overlying skin and then a small  opening made.  An 0 Vicryl pursestring was placed and the Hasson cannula  introduced.  The camera was inserted after carbon dioxide was started.  There was a large inflammatory process adherent to the right upper quadrant  and caught over the liver, kind of over the gallbladder.  Whether this was  fairly marked acute cholecystitis or just what, we could not tell with just  the camera view.  The upper 10 mm trocar was placed in the subxiphoid area  and then I used a dissector to kind of separate this obviously very inflamed  omentum from the anterior abdominal wall.  When this was done, the  underlying gallbladder really did not look acutely inflamed and I could not  see any areas of similar changes on the actual wall of the gallbladder  itself.  Dr. Lurene Shadow had assisted and placed two lateral 5 mm trocars and  then we were kind of moving the omentum around.  The best we could tell, the  underlying transverse colon and hepatic flexure of the colon were not  involved in anything.  You could feel how thickened this area was.  This was  an area probably 3 inches or so in length.  I was wondering if there was a  contiguation with the transverse colon since it did not appear from the  gallbladder.  We elected to go ahead and proceed with the cholecystectomy.  The gallbladder was retracted upward and outward.  It was a large, kind of  extended gallbladder.  The proximal portion of the gallbladder was  dissected, the cystic duct identified and separated from the surrounding  fatty tissue.  The few adhesions  proximally were separated.  Then a clip was  placed at the distal cystic duct and gallbladder.  A little opening was made  proximally and a Cook catheter introduced and a cholangiogram obtained.  There was good prompt filling of the extrahepatic biliary system, good flow  into the duodenum.  The intrahepatic radicals were visualized and there was  no evidence of any stones.  We then removed the catheter, triply clipped the  cystic duct proximally and just divided it, and identified the cystic artery  which was doubly clipped proximally, singly distally, and divided.  Then the  gallbladder was kind of on omentum and it was freed from the liver bed.  I  place the distended gallbladder into an EndoCatch bag.  There was good  hemostasis obtained.  We then switched the camera to the upper 10 mm port  and withdrew the bag containing the gallbladder in the umbilical fascial  defect.  I opened the gallbladder on the back field and, like the inspection  with the laparoscope, the gallbladder was not acutely inflamed.  On  palpation, I could not feel any stones.  There were some cholesterol  crystals but not actually any true stones that I could feel in the  gallbladder itself.   Next, we switched from doing this laparoscopically but I could grasp the  mass with a million dollar grasper through the umbilical Hasson cannula.  I  then nicely released the carbon dioxide and removed the Hasson and then kind  of pulled this area to the omentum.  I separated the peritoneum from the  fascia at the umbilical fascia defect which I was planning to close with  interrupted 0 Prolene sutures anyway.  Then through this little opening, we  could bring this inflammatory process through and we could slip it through  and the actual proximal portion was just normal.  The pedicles were clamped  with Kellys and then ligated with 2-0 Vicryl.  The area, whether it was marked inflammatory or possibly a tumor or just what, I  could not really  tell.  I sent it for frozen and this is fat necrosis of the omentum.  I  expect the actual origin of her pain has been this infarcted segment of  omentum.  I had looked with the laparoscope and could not see anything in  the pelvis or any other similar areas any place that looked like a tumor or  anything.  According to her records from the [facility], I do not think she has  ever had a colonoscopy.  She has had no previous abdominal surgery.  We then  closed the peritoneum that had been separated so I could get two layers at  the umbilicus and then closed the fascia transversely with interrupted  sutures of 0 Prolene.  Four sutures were placed.  I then re-inserted the  camera, inspected everything, and it looked fine.  There were no adhesions  up to the umbilicus.  There was no fluid to be aspirated where we had  removed the gallbladder, and the two lateral 5 mm ports were withdrawn under  direct vision and carbon dioxide released and the other 10 mm trocar  withdrawn.  I did place a single stitch in the fascia at the epigastric area  or the subxiphoid area since you could see into the peritoneal cavity.  She  has a very thin abdominal wall.  The subcutaneous wounds were closed with 4-  0 Vicryl.  I placed Benzoin and Steri-Strips on all three except for the  umbilicus.  It kind of folded over so I thought some interrupted 4-0 nylon  sutures would be better for cosmetic, and I did this with five sutures being  placed.  The patient tolerated the procedure well, was extubated and taken  to the recovery room in a stable postoperative condition.  The patient  should be ready for discharge tomorrow.  Whether the patient had had a short  episode of acute cholecystitis causing the omental infarction or whether  this was a primary segment of infarcted omentum, I am not sure.  We will see  if any stones are identified by the pathologist, as I could not feel any on  palpation of the  gallbladder.      WJW/MEDQ  D:  12/30/2004  T:  12/30/2004  Job:  [id number]   cc:   Rosalyn Gess. Norins, M.D. [facility]

## 2011-03-05 NOTE — Assessment & Plan Note (Signed)
[facility]                           PRIMARY CARE OFFICE NOTE   NASIYAH, LAVERDIERE                       MRN:          [id number]  DATE:10/21/2006                            DOB:          [birth year]    Ms. Kayla Kent is a 77 year old woman whom I have following for many years  who presents for followup evaluation  and exam.  She was last seen in  the practice by Cardiology July 13, 2006 in followup for mitral  valve prolapse and regurgitation.  Patient reports at this time she is  feeling well and doing well with no active complaints or problems.   PAST MEDICAL HISTORY:   SURGICAL:  1. Tonsillectomy, remote.  2. IUD removed surgically after 36 years several years ago.  3. Laparoscopic cholecystectomy in 2006.   MEDICAL ILLNESSES:  1. Usual childhood diseases.  2. Hypertension.  3. Question of hyperlipidemia.  4. Uterine prolapse.   HEALTH MAINTENANCE:  Patient is overdue for followup health maintenance.  Her last pelvic and Pap smear was with Dr. Conley Simmonds, evidently in  2002.  Patient's last mammogram was October 03, 2006, and was  unremarkable even after a second-look study for a question of suspicious  lesion.  Patient has not had colorectal cancer screening.   CHART REVIEW:  Patient had a rest test technetium study August 05, 1993  which was unremarkable.   Last 2D echo Feb 22, 2005 showed normal left ventricular size.  Normal  left ventricular ejection fraction.  Mitral valve showed no evidence of  stenosis, stuttering or prolapse.  There was trace mitral regurgitation.  There was no other valvular abnormality noted.   Last hospitalization was for acute cholecystitis and cholecystectomy  December 30, 2004.   Patient actually did have a more recent stress exercise nuclear cardiac  study April 16, 1999 at [facility] with an EF of  68%.   Last laboratory from April 13, 2000 showed a normal LDL at 106, total  cholesterol being 162.   FAMILY HISTORY:  Significant for hyperlipidemia, coronary artery disease  with a brother having had an MI at age 53.  Positive for hypertension.  Negative for colon cancer, breast cancer or diabetes.   SOCIAL HISTORY:  Patient was married for almost 49 years and has been  widowed approximately a year and a half.  Her husband had a CVA and  required significant care.  Patient has a devoted son who lives in  [place], who is an EMT and checks on her twice a day.  She has a  daughter who lives in [place].  She has 2 grandchildren.  She is a  retired Engineer, site.  She keeps herself very active busy.   REVIEW OF SYSTEMS:  Negative for any constitutional, cardiovascular,  respiratory, GI or GU complaints at this time.   EXAMINATION:  Temperature was 97.1.  Blood pressure 141/56.  Pulse 63.  Weight 155.  GENERAL APPEARANCE:  This is a well-nourished, well-developed woman, in  no acute distress.  HEENT EXAM:  Normocephalic and atraumatic.  EACs and TMs were  normal.  Oropharynx was negative.  Dentition in good repair.  No buccal or palate  lesions were noted.  Posterior pharynx was clear.  Patient is noted to  have significant strabismus with outward directed right eye.  Conjunctivae and sclerae were clear.  Pupils were equal, round and  reactive.  Funduscopic exam:  Left eye was unremarkable with normal disk  margin with no vascular abnormalities.  NECK:  Supple without thyromegaly.  No adenopathy was noted in the  cervical or supraclavicular regions.  CHEST:  No CVA tenderness.  LUNGS:  Clear to auscultation and percussion.  CARDIOVASCULAR:  Two plus peripheral pulse.  No JVD or carotid bruits.  She had quiet precordium with a regular rate and rhythm with no murmurs,  rubs or gallops.  BREAST EXAM:  Deferred to Gynecology.  ABDOMEN:  Soft.  No guarding or rebound.  No organosplenomegaly.  GENITALIA EXAM:  Deferred to Gynecology.  RECTAL EXAM:  Deferred to  Gynecology.  EXTREMITIES:  Without clubbing, cyanosis or edema.  No deformities were  noted.  DERM:  Patient is noted to have multiple cherry angiomas.  She has no  other suspicious lesions on head, neck, back, abdomen or chest.   DATABASE:  Patient will return at her convenience for a lipid panel and  metabolic panel.   ASSESSMENT AND PLAN:  1. Cardiovascular.  Patient has been established with Dr. Diona Browner,      last seeing the patient July 13, 2006.  She has no significant      mitral valve prolapse based on 2D echocardiogram with only trace      mitral regurgitation.  Would not need sinus bradycardiac      prophylaxis.  Patient has episodes of paroxysmal atrial      tachycardia, but recently has been stable.  No further evaluation      or exam is required.  2. Hypertension.  Patient has borderline control on a 4-drug regimen      including Verapamil ES 240 mg daily, Atenolol 50 mg daily, Diovan      hydrochlorothiazide 160/12.5 mg daily.  Patient to continue with      same.  3. Health maintenance.  Patient is a candidate for colorectal cancer      screening, and will be referred to Gastrointestinal.  Patient is      adamantly encouraged to schedule a Gynecology exam with Dr. Conley Simmonds for routine followup.   SUMMARY:  This is a very pleasant woman who seems to be medically stable  at this time.  I would like to see her back for routine followup in  year, sooner on an as-needed basis.     Rosalyn Gess Norins, MD  Electronically Signed    MEN/MedQ  DD: 10/22/2006  DT: 10/22/2006  Job #: [id number]   cc:   Eliseo Squires, M.D.

## 2011-03-08 NOTE — Telephone Encounter (Signed)
Please advise regarding RF 

## 2011-03-10 ENCOUNTER — Telehealth: Payer: Self-pay | Admitting: *Deleted

## 2011-03-10 ENCOUNTER — Other Ambulatory Visit: Payer: Self-pay | Admitting: *Deleted

## 2011-03-10 MED ORDER — ALPRAZOLAM 0.25 MG PO TABS: 0.2500 mg | ORAL_TABLET | Freq: Three times a day (TID) | ORAL | Status: DC | PRN

## 2011-03-10 NOTE — Telephone Encounter (Signed)
Patient requesting RF of XANAX.

## 2011-03-14 ENCOUNTER — Other Ambulatory Visit: Payer: Self-pay | Admitting: Internal Medicine

## 2011-03-23 ENCOUNTER — Other Ambulatory Visit: Payer: Self-pay | Admitting: Internal Medicine

## 2011-04-22 ENCOUNTER — Encounter: Payer: Self-pay | Admitting: Internal Medicine

## 2011-06-23 ENCOUNTER — Other Ambulatory Visit: Payer: Self-pay | Admitting: Internal Medicine

## 2011-07-10 ENCOUNTER — Other Ambulatory Visit: Payer: Self-pay | Admitting: Internal Medicine

## 2011-08-03 ENCOUNTER — Other Ambulatory Visit: Payer: Self-pay | Admitting: Internal Medicine

## 2011-08-03 NOTE — Telephone Encounter (Signed)
Ok to renew?  

## 2011-09-19 ENCOUNTER — Other Ambulatory Visit: Payer: Self-pay | Admitting: Internal Medicine

## 2011-09-28 ENCOUNTER — Other Ambulatory Visit: Payer: Self-pay | Admitting: Internal Medicine

## 2011-09-30 NOTE — Progress Notes (Signed)
HPI  Patient is a 77 year old with a history of PVOD, dyslipidemia, SVT (s/p ablation) and diastolic CHF.  She was last in [facility] in Feb 2012.  Allergies  Allergen Reactions  . Codeine   . Diazepam   . Iodine   . Iohexol      Code: HIVES, Desc: pt gets 13 hr pre-meds, Onset Date: [id number]   . Penicillins   . Sulfonamide Derivatives     Current Outpatient Prescriptions  Medication Sig Dispense Refill  . ALPRAZolam (XANAX) 0.25 MG tablet Take 1 tablet (0.25 mg total) by mouth 3 (three) times daily as needed for anxiety.  120 tablet  5  . ALPRAZolam (XANAX) 0.25 MG tablet TAKE 1 TABLET BY MOUTH FOUR TIMES DAILY  120 tablet  3  . aspirin 81 MG EC tablet Take 81 mg by mouth daily.        Marland Kitchen atenolol (TENORMIN) 50 MG tablet TAKE 1 TABLET BY MOUTH EVERY DAY  90 tablet  1  . buPROPion (WELLBUTRIN XL) 150 MG 24 hr tablet TAKE 1 TABLET BY MOUTH EVERY MORNING  30 tablet  0  . buPROPion (WELLBUTRIN XL) 150 MG 24 hr tablet TAKE 1 TABLET BY MOUTH EVERY MORNING FOR DEPRESSION  90 tablet  0  . cilostazol (PLETAL) 100 MG tablet TAKE 1/2 EVERY MORNING AND 1 EVERY EVENING  45 tablet  4  . ciprofloxacin (CIPRO) 500 MG tablet Take 500 mg by mouth 2 (two) times daily as needed. Take 1 before and 1 after dental work       . diltiazem (DILACOR XR) 180 MG 24 hr capsule TAKE ONE CAPSULE BY MOUTH EVERY DAY  30 capsule  0  . furosemide (LASIX) 20 MG tablet TAKE 1 TABLET BY MOUTH EVERY DAY  90 tablet  1  . rosuvastatin (CRESTOR) 5 MG tablet Take 5 mg by mouth at bedtime.        . Wheat Dextrin (BENEFIBER) POWD Take by mouth daily. 1 tsp         Past Medical History  Diagnosis Date  . Anxiety   . Paroxysmal supraventricular tachycardia   . MVP (mitral valve prolapse)     hx  . Uterine prolapse without mention of vaginal wall prolapse   . HTN (hypertension)   . HLD (hyperlipidemia)   . Intolerance of drug     orthostatic  . PVD (peripheral vascular disease)   . Allergic rhinitis     Past Surgical  History  Procedure Date  . Corrective eye surgery     as a child  . Cholecystectomy   . Tonsillectomy   . Ivd removed   . Stress cardiolite 08/05/93  . Rf ablation psvt     summer '10    Family History  Problem Relation Age of Onset  . Coronary artery disease      family hx (also positive for lipids)  . Hypertension      family hx  . Colon cancer Neg Hx   . Breast cancer Neg Hx   . Diabetes Neg Hx     History   Social History  . Marital Status: Widowed    Spouse Name: N/A    Number of Children: N/A  . Years of Education: N/A   Occupational History  . Not on file.   Social History Main Topics  . Smoking status: Former Games developer  . Smokeless tobacco: Not on file   Comment: quit in 1995   . Alcohol Use: No  .  Drug Use: Not on file  . Sexually Active: Not on file   Other Topics Concern  . Not on file   Social History Narrative   Married 49 years. Widowed approx 2 years ago. 1 son - 30; 1 daughter - 58; 3 grandchildren Lives alone     Review of Systems:  All systems reviewed.  They are negative to the above problem except as previously stated.  Vital Signs: BP:  122/56   P 81  Wt 139  Physical Exam  Patient is in NAD    HEENT:  Normocephalic, atraumatic. EOMI, PERRLA.  Neck: JVP is normal. No thyromegaly. No bruits.  Lungs: clear to auscultation. No rales no wheezes.  Heart: Regular rate and rhythm. Normal S1, S2. No S3.   No significant murmurs. PMI not displaced.  Abdomen:  Supple, nontender. Normal bowel sounds. No masses. No hepatomegaly.  Extremities:   Good distal pulses throughout. No lower extremity edema.  Musculoskeletal :moving all extremities.  Neuro:   alert and oriented x3.  CN II-XII grossly intact.  EKG:  Sinus rhythm.  81 bpm.  Assessment and Plan:

## 2011-10-01 ENCOUNTER — Ambulatory Visit (INDEPENDENT_AMBULATORY_CARE_PROVIDER_SITE_OTHER): Payer: Medicare Other | Admitting: Internal Medicine

## 2011-10-01 ENCOUNTER — Encounter: Payer: Self-pay | Admitting: Internal Medicine

## 2011-10-01 VITALS — BP 122/56 | HR 81 | Ht 67.0 in | Wt 139.0 lb

## 2011-10-01 DIAGNOSIS — I498 Other specified cardiac arrhythmias: Secondary | ICD-10-CM

## 2011-10-01 DIAGNOSIS — E785 Hyperlipidemia, unspecified: Secondary | ICD-10-CM

## 2011-10-01 DIAGNOSIS — I2510 Atherosclerotic heart disease of native coronary artery without angina pectoris: Secondary | ICD-10-CM

## 2011-10-01 DIAGNOSIS — I10 Essential (primary) hypertension: Secondary | ICD-10-CM

## 2011-10-01 MED ORDER — ZOLPIDEM TARTRATE 5 MG PO TABS: 5.0000 mg | ORAL_TABLET | Freq: Every evening | ORAL | Status: DC | PRN

## 2011-10-01 MED ORDER — HYDROCORTISONE ACETATE 25 MG RE SUPP: 25.0000 mg | Freq: Two times a day (BID) | RECTAL | Status: AC

## 2011-10-06 NOTE — Assessment & Plan Note (Signed)
No symptoms to suggest recurrent tachycardia.

## 2011-10-06 NOTE — Assessment & Plan Note (Signed)
Needs to be rechecked. 

## 2011-10-06 NOTE — Assessment & Plan Note (Signed)
BP is well controlled 

## 2011-10-08 ENCOUNTER — Telehealth: Payer: Self-pay | Admitting: Internal Medicine

## 2011-10-08 NOTE — Telephone Encounter (Signed)
New msg Pt said someone called her yesterday she is returning the call

## 2011-10-08 NOTE — Telephone Encounter (Signed)
Patient called, stating she saw on her caller ID someone from this office called and did not leave a message, she was calling to find out if we needed her.Patient was told could not find in her chart that we tried to call her.

## 2011-10-15 ENCOUNTER — Encounter: Payer: Self-pay | Admitting: Internal Medicine

## 2011-10-16 ENCOUNTER — Other Ambulatory Visit: Payer: Self-pay | Admitting: Internal Medicine

## 2011-10-20 ENCOUNTER — Other Ambulatory Visit: Payer: Self-pay | Admitting: *Deleted

## 2011-10-20 MED ORDER — BUPROPION HCL ER (XL) 150 MG PO TB24: 150.0000 mg | ORAL_TABLET | Freq: Every day | ORAL | Status: DC

## 2011-10-20 MED ORDER — ATENOLOL 50 MG PO TABS: 50.0000 mg | ORAL_TABLET | Freq: Every day | ORAL | Status: DC

## 2011-11-25 ENCOUNTER — Other Ambulatory Visit: Payer: Self-pay | Admitting: *Deleted

## 2011-11-26 ENCOUNTER — Other Ambulatory Visit: Payer: Self-pay

## 2011-11-26 ENCOUNTER — Other Ambulatory Visit: Payer: Self-pay | Admitting: *Deleted

## 2011-11-26 MED ORDER — DILTIAZEM HCL ER 180 MG PO CP24: 180.0000 mg | ORAL_CAPSULE | Freq: Every day | ORAL | Status: DC

## 2011-11-26 NOTE — Telephone Encounter (Signed)
Request for Diltiazem refused 02.07.13: Patient needs appointment [last OV 10.18.2010] Pharmacy informed again via telephone; Pt must have appointment prior to refill authorizations. LMOM for patient that unfortunately due to policy, we cannot authorize refills without a prior OV due to the length of time it has been since her last OV; left contact name & number for Pt to call back and schedule appt.

## 2011-12-08 ENCOUNTER — Ambulatory Visit (INDEPENDENT_AMBULATORY_CARE_PROVIDER_SITE_OTHER): Payer: Medicare Other | Admitting: Internal Medicine

## 2011-12-08 ENCOUNTER — Encounter: Payer: Self-pay | Admitting: Internal Medicine

## 2011-12-08 ENCOUNTER — Other Ambulatory Visit (INDEPENDENT_AMBULATORY_CARE_PROVIDER_SITE_OTHER): Payer: Medicare Other

## 2011-12-08 VITALS — BP 128/66 | HR 73 | Temp 97.5°F | Resp 16 | Ht 64.75 in | Wt 139.2 lb

## 2011-12-08 DIAGNOSIS — Z1239 Encounter for other screening for malignant neoplasm of breast: Secondary | ICD-10-CM

## 2011-12-08 DIAGNOSIS — I739 Peripheral vascular disease, unspecified: Secondary | ICD-10-CM

## 2011-12-08 DIAGNOSIS — Z1211 Encounter for screening for malignant neoplasm of colon: Secondary | ICD-10-CM

## 2011-12-08 DIAGNOSIS — N814 Uterovaginal prolapse, unspecified: Secondary | ICD-10-CM

## 2011-12-08 DIAGNOSIS — I471 Supraventricular tachycardia: Secondary | ICD-10-CM

## 2011-12-08 DIAGNOSIS — I10 Essential (primary) hypertension: Secondary | ICD-10-CM

## 2011-12-08 DIAGNOSIS — Z0000 Encounter for general adult medical examination without abnormal findings: Secondary | ICD-10-CM | POA: Diagnosis not present

## 2011-12-08 DIAGNOSIS — Z8679 Personal history of other diseases of the circulatory system: Secondary | ICD-10-CM | POA: Diagnosis not present

## 2011-12-08 DIAGNOSIS — E785 Hyperlipidemia, unspecified: Secondary | ICD-10-CM

## 2011-12-08 DIAGNOSIS — F329 Major depressive disorder, single episode, unspecified: Secondary | ICD-10-CM

## 2011-12-08 DIAGNOSIS — I509 Heart failure, unspecified: Secondary | ICD-10-CM

## 2011-12-08 DIAGNOSIS — F411 Generalized anxiety disorder: Secondary | ICD-10-CM

## 2011-12-08 LAB — CBC WITH DIFFERENTIAL/PLATELET
Basophils Absolute: 0.1 10*3/uL (ref 0.0–0.1)
HCT: 41.8 % (ref 36.0–46.0)
Lymphs Abs: 2.3 10*3/uL (ref 0.7–4.0)
MCV: 89.6 fl (ref 78.0–100.0)
Monocytes Absolute: 0.6 10*3/uL (ref 0.1–1.0)
Platelets: 209 10*3/uL (ref 150.0–400.0)
RDW: 14.1 % (ref 11.5–14.6)

## 2011-12-08 LAB — COMPREHENSIVE METABOLIC PANEL
Albumin: 3.9 g/dL (ref 3.5–5.2)
BUN: 21 mg/dL (ref 6–23)
CO2: 29 mEq/L (ref 19–32)
Calcium: 9.5 mg/dL (ref 8.4–10.5)
Chloride: 106 mEq/L (ref 96–112)
Glucose, Bld: 88 mg/dL (ref 70–99)
Potassium: 4.7 mEq/L (ref 3.5–5.1)

## 2011-12-08 LAB — LIPID PANEL: Cholesterol: 146 mg/dL (ref 0–200)

## 2011-12-08 LAB — HEPATIC FUNCTION PANEL
Bilirubin, Direct: 0.1 mg/dL (ref 0.0–0.3)
Total Bilirubin: 0.6 mg/dL (ref 0.3–1.2)
Total Protein: 7.5 g/dL (ref 6.0–8.3)

## 2011-12-08 NOTE — Progress Notes (Signed)
Subjective:    Patient ID: Kayla Kent, female    DOB: 02-13-1932, 78 y.o.   MRN: [id number]  HPI The patient is here for annual Medicare wellness examination and management of other chronic and acute problems.   The risk factors are reflected in the social history.  The roster of all physicians providing medical care to patient - is listed in the Snapshot section of the chart.  Activities of daily living:  The patient is 100% inedpendent in all ADLs: dressing, toileting, feeding as well as independent mobility  Home safety : The patient has smoke detectors in the home. They wear seatbelts. Falls- house is fall-safe. Recommended grab bars.  Firearms are present in the home, kept in a safe fashion. There is no violence in the home.   There is no risks for hepatitis, STDs or HIV. There is no history of blood transfusion. They have no travel history to infectious disease endemic areas of the world.  The patient has seen their dentist in the last six month. They have not seen their eye doctor in the last year. They deny any hearing difficulty and have not had audiologic testing in the last year.  They do not  have excessive sun exposure. Discussed the need for sun protection: hats, long sleeves and use of sunscreen if there is significant sun exposure.   Diet: the importance of a healthy diet is discussed. They do have a healthy diet.  The patient has a regular exercise program: silver sneakers @ [employer] ,  45 min duration, 2 per week.  The benefits of regular aerobic exercise were discussed.  Depression screen: there are no signs or vegative symptoms of depression- irritability, change in appetite, anhedonia, sadness/tearfullness.  Cognitive assessment: the patient manages all their financial and personal affairs and is actively engaged. They could relate day,date,year and events; recalled 1/3 objects at 3 minutes; performed clock-face test normally.  The following portions of the patient's  history were reviewed and updated as appropriate: allergies, current medications, past family history, past medical history,  past surgical history, past social history  and problem list.  Vision, hearing, body mass index were assessed and reviewed.   During the course of the visit the patient was educated and counseled about appropriate screening and preventive services including : fall prevention , diabetes screening, nutrition counseling, colorectal cancer screening, and recommended immunizations.  Past Medical History  Diagnosis Date  . Anxiety   . Paroxysmal supraventricular tachycardia   . MVP (mitral valve prolapse)     hx  . Uterine prolapse without mention of vaginal wall prolapse   . HTN (hypertension)   . HLD (hyperlipidemia)   . Intolerance of drug     orthostatic  . PVD (peripheral vascular disease)   . Allergic rhinitis    Past Surgical History  Procedure Date  . Corrective eye surgery     as a child  . Cholecystectomy   . Tonsillectomy   . Ivd removed   . Stress cardiolite 08/05/93  . Rf ablation psvt     summer '10   Family History  Problem Relation Age of Onset  . Coronary artery disease      family hx (also positive for lipids)  . Hypertension      family hx  . Colon cancer Neg Hx   . Breast cancer Neg Hx   . Diabetes Neg Hx   . Mental illness Father     suicide  . Arthritis Father   .  Hyperlipidemia Sister   . Hypertension Sister   . Heart disease Brother     CAD/MI  . Hypertension Brother    History   Social History  . Marital Status: Widowed    Spouse Name: N/A    Number of Children: 2  . Years of Education: 18   Occupational History  . retired    Social History Main Topics  . Smoking status: Former Smoker    Quit date: 12/08/1955  . Smokeless tobacco: Never Used   Comment: quit in 1995   . Alcohol Use: No  . Drug Use: No  . Sexually Active: No   Other Topics Concern  . Not on file   Social History Narrative   [employer], [employer]-early childhood. Married '55 -51 years.  1 son - 31; 1 daughter - 62; 3 grandchildren . Lives alone. ACP - discussed and provided packet on end of life care (Feb '13)   Current Outpatient Prescriptions on File Prior to Visit  Medication Sig Dispense Refill  . ALPRAZolam (XANAX) 0.25 MG tablet Take 1 tablet (0.25 mg total) by mouth 3 (three) times daily as needed for anxiety.  120 tablet  5  . aspirin 81 MG EC tablet Take 81 mg by mouth daily.        Marland Kitchen atenolol (TENORMIN) 50 MG tablet Take 1 tablet (50 mg total) by mouth daily.  90 tablet  1  . buPROPion (WELLBUTRIN XL) 150 MG 24 hr tablet Take 1 tablet (150 mg total) by mouth daily.  90 tablet  1  . cilostazol (PLETAL) 100 MG tablet TAKE 1/2 EVERY MORNING AND 1 EVERY EVENING  45 tablet  4  . ciprofloxacin (CIPRO) 500 MG tablet Take 500 mg by mouth 2 (two) times daily as needed. Take 1 before and 1 after dental work       . CRESTOR 5 MG tablet TAKE 1 TABLET BY MOUTH EVERY NIGHT AT BEDTIME  30 tablet  10  . diltiazem (DILACOR XR) 180 MG 24 hr capsule Take 1 capsule (180 mg total) by mouth daily.  30 capsule  0  . zolpidem (AMBIEN) 5 MG tablet Take 1 tablet (5 mg total) by mouth at bedtime as needed.  30 tablet  0       Review of Systems Constitutional:  Negative for fever, chills, activity change and unexpected weight change.  HEENT:  Negative for hearing loss, ear pain, congestion, neck stiffness and postnasal drip. Negative for sore throat or swallowing problems. Negative for dental complaints.   Eyes: Negative for vision loss or change in visual acuity.  Respiratory: Negative for chest tightness and wheezing. Negative for DOE.   Cardiovascular: Negative for chest pain or palpitations. No decreased exercise tolerance Gastrointestinal: No change in bowel habit. No bloating or gas. No reflux or indigestion Genitourinary: Negative for urgency, frequency, flank pain and difficulty urinating.  Musculoskeletal: Negative  for myalgias, back pain, arthralgias and gait problem.  Neurological: Negative for dizziness, tremors, weakness and headaches.  Hematological: Negative for adenopathy.  Psychiatric/Behavioral: Negative for behavioral problems and dysphoric mood.       Objective:   Physical Exam Filed Vitals:   12/08/11 1024  BP: 128/66  Pulse: 73  Temp: 97.5 F (36.4 C)  Resp: 16  Weight: 139 lb 4 oz (63.163 kg)  Body mass index is 23.35 kg/(m^2).  Gen'l: well nourished, well developed white woman in no distress HEENT - [place]/AT, EACs/TMs normal, oropharynx with native dentition in good  condition, no buccal or palatal lesions, posterior pharynx clear, mucous membranes moist. C&S clear, right eye with lateral strabismus, PERRLA. Neck - supple, no thyromegaly Nodes- negative submental, cervical, supraclavicular regions Chest - no deformity, no CVAT Lungs - cleat without rales, wheezes. No increased work of breathing Breast - deferred Cardiovascular - regular rate and rhythm, quiet precordium, no murmurs, rubs or gallops, 2+ radial, DP and PT pulses Abdomen - BS+ x 4, no HSM, no guarding or rebound or tenderness Pelvic - deferred to gyn Rectal - deferred to gyn Extremities - no clubbing, cyanosis, edema or deformity.  Neuro - A&O x 3, CN II-XII normal, motor strength normal and equal, DTRs 2+ and symmetrical biceps, radial, and patellar tendons. Cerebellar - no tremor, no rigidity, fluid movement and normal gait. Derm - Head, neck, back, abdomen and extremities without suspicious lesions  Lab Results  Component Value Date   WBC 7.8 12/08/2011   HGB 13.9 12/08/2011   HCT 41.8 12/08/2011   PLT 209.0 12/08/2011   GLUCOSE 88 12/08/2011   CHOL 146 12/08/2011   TRIG 100.0 12/08/2011   HDL 48.50 12/08/2011   LDLDIRECT 175.3 01/19/2008   LDLCALC 78 12/08/2011        ALT 19 12/08/2011   AST 21 12/08/2011        NA 141 12/08/2011   K 4.7 12/08/2011   CL 106 12/08/2011   CREATININE 1.2 12/08/2011   BUN 21  12/08/2011   CO2 29 12/08/2011   TSH 2.236  07/16/2009   INR 1.1 ratio* 06/09/2009          Assessment & Plan:

## 2011-12-09 ENCOUNTER — Other Ambulatory Visit: Payer: Self-pay | Admitting: *Deleted

## 2011-12-09 MED ORDER — ROSUVASTATIN CALCIUM 5 MG PO TABS: ORAL_TABLET | ORAL | Status: DC

## 2011-12-09 MED ORDER — ATENOLOL 50 MG PO TABS: 50.0000 mg | ORAL_TABLET | Freq: Every day | ORAL | Status: DC

## 2011-12-09 MED ORDER — ALPRAZOLAM 0.25 MG PO TABS: 0.2500 mg | ORAL_TABLET | Freq: Three times a day (TID) | ORAL | Status: DC | PRN

## 2011-12-09 MED ORDER — BUPROPION HCL ER (XL) 150 MG PO TB24: 150.0000 mg | ORAL_TABLET | Freq: Every day | ORAL | Status: DC

## 2011-12-09 MED ORDER — CILOSTAZOL 100 MG PO TABS: ORAL_TABLET | ORAL | Status: DC

## 2011-12-09 MED ORDER — ZOLPIDEM TARTRATE 5 MG PO TABS: 5.0000 mg | ORAL_TABLET | Freq: Every evening | ORAL | Status: DC | PRN

## 2011-12-09 MED ORDER — DILTIAZEM HCL ER 180 MG PO CP24: 180.0000 mg | ORAL_CAPSULE | Freq: Every day | ORAL | Status: DC

## 2011-12-12 DIAGNOSIS — Z0000 Encounter for general adult medical examination without abnormal findings: Secondary | ICD-10-CM | POA: Insufficient documentation

## 2011-12-12 NOTE — Assessment & Plan Note (Signed)
Doing well with no evidence of decompensation: no SOB/DOE or limitations in activity. No requirement for diuretics

## 2011-12-12 NOTE — Assessment & Plan Note (Signed)
She reports that she feels she is stable. Her son has encouraged an increase in medication dosage or med table.  Plan - she voices good control of her symptoms. No change in medication, Wellbutrin, at this time.

## 2011-12-12 NOTE — Assessment & Plan Note (Signed)
Continued problem. She does have a gynecologist. Discussed possible use of pessary vs hysterectomy. Her gynecologist is considering robotic assisted surgery.

## 2011-12-12 NOTE — Assessment & Plan Note (Signed)
BP Readings from Last 3 Encounters:  12/08/11 128/66  10/01/11 122/56  12/03/10 126/64   Good control on present medications. Will continue the same.

## 2011-12-12 NOTE — Assessment & Plan Note (Signed)
Last 2 D echo in July '10. No hemodynamically significant MVP. No report of palpations or other symptoms

## 2011-12-12 NOTE — Assessment & Plan Note (Signed)
Interval medical history is benign. Physical exam, sans breast and pelvic, is normal. Lab results are excellent. She will be referred for screening colonoscopy. She is current with her gyn, current with breast cancer screening. Immunizations are up to date.  In summary - a nice woman who is medically stable. She is encouraged to be active and to exercise at least 3 times a week. She will return in 1 year or as needed.

## 2011-12-12 NOTE — Assessment & Plan Note (Signed)
Was last evaluated by EP in July '10. No complaints of recurrent tachyarrhythmias. Currently on beta-blocker and calcium channel blocker therapy.  Plan - no change in medications.

## 2011-12-12 NOTE — Assessment & Plan Note (Signed)
LDL is mucch betteer than goal of 130 or less. Tolerating medication w/o adverse effects  Plan - continue present meds.

## 2011-12-12 NOTE — Assessment & Plan Note (Signed)
Stable and doing well with prn use of alprazolam.

## 2011-12-12 NOTE — Assessment & Plan Note (Signed)
Asymptomatic - no claudication.  Plan - continued risk modification with good lipid and BP control.

## 2011-12-17 DIAGNOSIS — Z1231 Encounter for screening mammogram for malignant neoplasm of breast: Secondary | ICD-10-CM | POA: Diagnosis not present

## 2011-12-24 ENCOUNTER — Other Ambulatory Visit: Payer: Self-pay | Admitting: Internal Medicine

## 2011-12-27 ENCOUNTER — Telehealth: Payer: Self-pay | Admitting: Internal Medicine

## 2011-12-27 NOTE — Telephone Encounter (Signed)
No hx of valve replacement, told dentist office no pre treatment needed.

## 2011-12-27 NOTE — Telephone Encounter (Signed)
New problem:  Per Aundra Millet, does patient need to be pre-med before dental work. appt on/18.

## 2011-12-29 ENCOUNTER — Ambulatory Visit (INDEPENDENT_AMBULATORY_CARE_PROVIDER_SITE_OTHER): Payer: Medicare Other | Admitting: Internal Medicine

## 2011-12-29 ENCOUNTER — Encounter: Payer: Self-pay | Admitting: Internal Medicine

## 2011-12-29 ENCOUNTER — Telehealth: Payer: Self-pay

## 2011-12-29 VITALS — BP 162/72 | HR 76 | Temp 97.1°F | Resp 16 | Wt 140.0 lb

## 2011-12-29 DIAGNOSIS — R55 Syncope and collapse: Secondary | ICD-10-CM | POA: Diagnosis not present

## 2011-12-29 NOTE — Patient Instructions (Signed)
Unusual episode - does NOT sound like a stroke, seizure or cardiac arrythmia. Your neuro exam today is normal.   Plan - will refer to Dr. Modesto Charon for neurology consultation           Continue all your present medication.  Dental prophylaxis - 2 D echo from 2010 reveals NO mitral valve prolapse or other valvular disease. There is no indication for prophylaxis.

## 2011-12-29 NOTE — Progress Notes (Signed)
Subjective:    Patient ID: Kayla Kent, female    DOB: 16-Nov-1931, 78 y.o.   MRN: [id number]  HPI Mrs. Mashaw presents after an episode last night where she without warning threw her hands back and fell backward, although she reports she was seeing down. There was no loss of consciousness, there was no post-ictal period or any change in mental status following the episode. A cardiac nurse was present and she evidently had a normal pulse. She was able to walk several hundred yards soon after the event. She had a similar episode July '12 with a very similar presentation. She has a h/o supraventricular tachycardia and had ablation by Dr. Ladona Ridgel in Sept '10. She had several subsequent visits to cardiology most recently to Dr. Tenny Craw Feb '12. In the intercurrent period she has been doing well. She has no prior neurologic problems or diagnosis.  Past Medical History  Diagnosis Date  . Anxiety   . Paroxysmal supraventricular tachycardia   . MVP (mitral valve prolapse)     hx  . Uterine prolapse without mention of vaginal wall prolapse   . HTN (hypertension)   . HLD (hyperlipidemia)   . Intolerance of drug     orthostatic  . PVD (peripheral vascular disease)   . Allergic rhinitis    Past Surgical History  Procedure Date  . Corrective eye surgery     as a child  . Cholecystectomy   . Tonsillectomy   . Ivd removed   . Stress cardiolite 08/05/93  . Rf ablation psvt     summer '10   Family History  Problem Relation Age of Onset  . Coronary artery disease      family hx (also positive for lipids)  . Hypertension      family hx  . Colon cancer Neg Hx   . Breast cancer Neg Hx   . Diabetes Neg Hx   . Mental illness Father     suicide  . Arthritis Father   . Hyperlipidemia Sister   . Hypertension Sister   . Heart disease Brother     CAD/MI  . Hypertension Brother    History   Social History  . Marital Status: Widowed    Spouse Name: N/A    Number of Children: 2  . Years of  Education: 18   Occupational History  . retired    Social History Main Topics  . Smoking status: Former Smoker    Quit date: 12/08/1955  . Smokeless tobacco: Never Used   Comment: quit in 1995   . Alcohol Use: No  . Drug Use: No  . Sexually Active: No   Other Topics Concern  . Not on file   Social History Narrative   HSG, [employer]-BA, [employer]-early childhood. Married '55 -51 years.  1 son - 9; 1 daughter - 57; 3 grandchildren . Lives alone. ACP - discussed and provided packet on end of life care (Feb '13)       Review of Systems System review is negative for any constitutional, cardiac, pulmonary, GI or neuro symptoms or complaints other than as described in the HPI.     Objective:   Physical Exam Filed Vitals:   12/29/11 1144  BP: 162/72  Pulse: 76  Temp: 97.1 F (36.2 C)  Resp: 16   Wt Readings from Last 3 Encounters:  12/29/11 140 lb (63.504 kg)  12/08/11 139 lb 4 oz (63.163 kg)  10/01/11 139 lb (63.05 kg)   Gen'l- elderly  white woman in no acute distress HEENT- [place]/AT, C&S clear, right eye deviated laterally (chronic), normal visual acuity Neck - supple Cor- 2+ radial pulse, RRR without mumur Pulm - normal respirations. Neuro - A&O x 3, speech clear, cognition seems normal. CN II-XII normal (chronically deviated right eye). MS 5/5. Cerebellar function - normal finger-to-nose, normal rapid alternating finger movement, no dysdiadocholinesia, normal heel-shin maneuver, normal gait and tandem gait.       Assessment & Plan:  Near syncopal episodes - patient with two episodes of sudden involuntary movement and momentary drop in lOC. No tonic/clonic movement, no inctontinence bowel or bladder, no post-ictal period, no focal neurologic findings. At last episode a trained nurse check her pulse at the time of the event thus making cardiac arrythmia much less likely. Question of absence type seizure vs other neurologic event.  Plan - continue present  meds           Refer to neurology - Dr. Modesto Charon           If no neurologic origin may need re-consultation with EP

## 2011-12-29 NOTE — Telephone Encounter (Signed)
[facility] Triage Call Report Triage Record Num: [id number] Operator: Amy Head Patient Name: Kayla Kent Call Date & Time: 12/28/2011 11:29:49PM Patient Phone: [phone] PCP: Illene Regulus Patient Gender: Female PCP Fax : [phone] Patient DOB: 02/15/1934 Practice Name: [facility] Reason for Call: Caller: Glen/Other; PCP: Illene Regulus; CB#: [phone]; Call Reason: Syncope, Passing Out; Sx Onset: 12/28/2011; Sx Notes:Has had these sxs before her ablation 2 years ago. Tonight, pt laid back and got rigid after passing out for approx "seconds". Occurred X 1 tonight. Son is not with mother. Mother is not having any sxs. All emergent sxs per Fainting protocol r/o. Advised to have pt seen within 4 hours per guidelines. Afebrile.; Wt: ; Guideline Used:Fainting ; Disp: See MD within 4 hours per guidelines. ; Appt Scheduled?: NO Pt refuses to be seen tonight and son wants appointment for tomorrow. Appt made for tomorrow @ 1130AM with Dr. Debby Bud. Protocol(s) Used: Fainting Recommended Outcome per Protocol: See Provider within 4 hours Reason for Outcome: Fainting episode without any warning symptoms Care Advice: ~ Another adult should drive. ~ Call provider if symptoms worsen or new symptoms develop. ~ Do not change medications or dosing regimen until provider is consulted. ~ SYMPTOM / CONDITION MANAGEMENT ~ IMMEDIATE ACTION ~ CAUTIONS ~ List, or take, all current prescription(s), nonprescription or alternative medication(s) to provider for evaluation. Most adults need to drink 6-10 eight-ounce glasses (1.2-2.0 liters) of fluids per day unless previously told to limit fluid intake for other medical reasons. Limit fluids that contain caffeine, sugar or alcohol. Urine will be a very light yellow color when you drink enough fluids. ~ A temporary drop in blood pressure sometimes occurs with a quick change to an upright position (postural hypotension) and may cause light-headedness  or dizziness. Change position slowly. Making a habit of rising slowly and sitting for a few minutes before standing to walk usually relieves the feeling of faintness. ~ When feeling faint, find a place to lie down if possible, and elevate legs 8 to 12 inches above the heart. If unable to lie down, sit in a chair and lower head between the knees for 3 to 5 minutes. If standing and not able to sit, cross legs and squeeze the knees together to move blood to the heart. ~ FAINTING CARE: - Evaluate breathing; call EMS 911 if breathing has stopped. - Position person on back with legs raised 8-12 inches (20-30 cm) above level of the head and keep legs supported. - Loosen belts, collars or other restrictive clothing. - Be sure area is well-ventilated (e.g. move people away from person, open windows/doors); if outside, place person in shaded area. - If person does not regain consciousness in 1 to 2 minutes, call 911 or other emergency medical assistance. - Once consciousness is regained, encourage person to remain in lying position. ~ Call EMS 911 if person takes longer than 1-2 minutes to regain consciousness, has another fainting episode, slurred speech, difficulty moving an arm or leg, mental status changes such as confusion or lethargy, or has an injury from falling. ~ 12/28/2011 11:53:41PM Page 1 of 1 [id number]

## 2011-12-30 ENCOUNTER — Encounter: Payer: Self-pay | Admitting: Neurology

## 2012-01-03 ENCOUNTER — Encounter: Payer: Self-pay | Admitting: Internal Medicine

## 2012-01-04 ENCOUNTER — Other Ambulatory Visit: Payer: Self-pay | Admitting: Internal Medicine

## 2012-01-04 MED ORDER — DIPHENOXYLATE-ATROPINE 2.5-0.025 MG PO TABS: 1.0000 | ORAL_TABLET | Freq: Four times a day (QID) | ORAL | Status: AC | PRN

## 2012-01-04 MED ORDER — PROMETHAZINE HCL 12.5 MG PO TABS: 12.5000 mg | ORAL_TABLET | Freq: Four times a day (QID) | ORAL | Status: AC | PRN

## 2012-01-04 NOTE — Telephone Encounter (Signed)
Rx done-patient informed. 

## 2012-01-04 NOTE — Telephone Encounter (Signed)
Per CMA - no fever, no blood or mucus in stool, no abdominal pain, no recent antibiotics but having difficulty keeping down fluids.  Plan - lomotil 1 after each loose stool limit 4/24hr           Phenergan 12.5 mg po every six hrs

## 2012-01-04 NOTE — Telephone Encounter (Signed)
Pt is requesting med to be call in for continuous diarrhea since last [facility] ph# [phone]

## 2012-01-04 NOTE — Telephone Encounter (Signed)
More information please: Fever? Blood or mucus in stool? Abdominal pain? Able to keep down fluids? Recent antibiotics?

## 2012-01-04 NOTE — Telephone Encounter (Signed)
Patient is having nausea, vomiting, and continuous diarrhea [small amounts of food/fluid intake] since Friday 03.15.13 Please advise.

## 2012-01-05 ENCOUNTER — Ambulatory Visit: Payer: Medicare Other | Admitting: Internal Medicine

## 2012-01-25 ENCOUNTER — Encounter: Payer: Self-pay | Admitting: Neurology

## 2012-01-25 ENCOUNTER — Other Ambulatory Visit (INDEPENDENT_AMBULATORY_CARE_PROVIDER_SITE_OTHER): Payer: Medicare Other

## 2012-01-25 ENCOUNTER — Ambulatory Visit: Payer: Medicare Other | Admitting: Neurology

## 2012-01-25 ENCOUNTER — Ambulatory Visit (INDEPENDENT_AMBULATORY_CARE_PROVIDER_SITE_OTHER): Payer: Medicare Other | Admitting: Neurology

## 2012-01-25 VITALS — BP 124/74 | HR 76

## 2012-01-25 DIAGNOSIS — I10 Essential (primary) hypertension: Secondary | ICD-10-CM

## 2012-01-25 DIAGNOSIS — R930 Abnormal findings on diagnostic imaging of skull and head, not elsewhere classified: Secondary | ICD-10-CM

## 2012-01-25 DIAGNOSIS — Z8679 Personal history of other diseases of the circulatory system: Secondary | ICD-10-CM

## 2012-01-25 DIAGNOSIS — I509 Heart failure, unspecified: Secondary | ICD-10-CM

## 2012-01-25 DIAGNOSIS — I471 Supraventricular tachycardia: Secondary | ICD-10-CM | POA: Diagnosis not present

## 2012-01-25 DIAGNOSIS — I739 Peripheral vascular disease, unspecified: Secondary | ICD-10-CM

## 2012-01-25 DIAGNOSIS — R55 Syncope and collapse: Secondary | ICD-10-CM

## 2012-01-25 LAB — BASIC METABOLIC PANEL
BUN: 18 mg/dL (ref 6–23)
Chloride: 107 mEq/L (ref 96–112)
Glucose, Bld: 90 mg/dL (ref 70–99)
Potassium: 4.1 mEq/L (ref 3.5–5.1)

## 2012-01-25 NOTE — Patient Instructions (Signed)
Go to the basement to have your labs drawn today.  Your MRI is scheduled for Wednesday, April 17th at 8:00am.  Please arrive to [facility], first floor admitting by 7:45am. [phone].  Your EEG is scheduled for the same day at 2:00pm.  Please arrive to [facility], first floor admitting by 1:45pm.  [phone].  We will contact you for your three month follow up appointment.

## 2012-01-25 NOTE — Progress Notes (Signed)
Dear Dr. Debby Bud,  Thank you for having me see Kayla Kent in consultation today at [facility] for her problem with two spells of decreased level of consciousness.  As you may recall, she is a 78 y.o. year old female with a history of paroxysmal SVT s/p ablation and PVD with vascular claudication who over the last year has had two spells where she was sitting and developed a feeling of being thrown forward and her vision seeing "blue sky" lasting seconds, followed by a quick return to normality.  With each spell there were no provoking maneuver that she can endorse.  No tongue biting or loss of bladder.  The patient was observed to lean backwards despite what she felt to be forward leaning sensation.  It was long enough that on each occasion help was called.  She denies palpitations or chest pain with these spells.  She has had palpitations with her SVT.  She has had one spell of loss of consciousness when she had a UTI before these events and stood up too quickly.  She can hear normally throughout the spell.  She says the sensation during the spell is unlike the feeling of lightheadedness when she gets up to quickly.  No headache associated with these events.  She was not exerting herself with these events.  Past Medical History  Diagnosis Date  . Anxiety   . Paroxysmal supraventricular tachycardia   . MVP (mitral valve prolapse)     hx  . Uterine prolapse without mention of vaginal wall prolapse   . HTN (hypertension)   . HLD (hyperlipidemia)   . Intolerance of drug     orthostatic  . PVD (peripheral vascular disease)   . Allergic rhinitis   - no history of seizure, ischemic stroke  Past Surgical History  Procedure Date  . Corrective eye surgery     as a child  . Cholecystectomy   . Tonsillectomy   . Ivd removed   . Stress cardiolite 08/05/93  . Rf ablation psvt     summer '10    History   Social History  . Marital Status: Widowed    Spouse Name: N/A    Number of  Children: 2  . Years of Education: 18   Occupational History  . retired    Social History Main Topics  . Smoking status: Former Smoker    Quit date: 12/08/1955  . Smokeless tobacco: Never Used   Comment: quit in 1995   . Alcohol Use: No  . Drug Use: No  . Sexually Active: No   Other Topics Concern  . None   Social History Narrative   HSG, [employer]-BA, [employer]-early childhood. Married '55 -51 years.  1 son - 18; 1 daughter - 39; 3 grandchildren . Lives alone. ACP - discussed and provided packet on end of life care (Feb '13)    Family History  Problem Relation Age of Onset  . Coronary artery disease      family hx (also positive for lipids)  . Hypertension      family hx  . Colon cancer Neg Hx   . Breast cancer Neg Hx   . Diabetes Neg Hx   . Mental illness Father     suicide  . Arthritis Father   . Hyperlipidemia Sister   . Hypertension Sister   . Heart disease Brother     CAD/MI  . Hypertension Brother     Current Outpatient Prescriptions on File Prior to  Visit  Medication Sig Dispense Refill  . ALPRAZolam (XANAX) 0.25 MG tablet Take 1 tablet (0.25 mg total) by mouth 3 (three) times daily as needed for anxiety.  180 tablet  1  . aspirin 81 MG EC tablet Take 81 mg by mouth daily.        Marland Kitchen atenolol (TENORMIN) 50 MG tablet Take 1 tablet (50 mg total) by mouth daily.  90 tablet  1  . buPROPion (WELLBUTRIN XL) 150 MG 24 hr tablet Take 1 tablet (150 mg total) by mouth daily.  90 tablet  1  . cilostazol (PLETAL) 100 MG tablet TAKE 1/2 EVERY MORNING AND 1 EVERY EVENING  135 tablet  1  . ciprofloxacin (CIPRO) 500 MG tablet Take 500 mg by mouth 2 (two) times daily as needed. Take 1 before and 1 after DENTAL WORK.      Marland Kitchen diltiazem (DILACOR XR) 180 MG 24 hr capsule Take 1 capsule (180 mg total) by mouth daily.  90 capsule  1  . rosuvastatin (CRESTOR) 5 MG tablet TAKE 1 TABLET BY MOUTH EVERY NIGHT AT BEDTIME  90 tablet  1  . zolpidem (AMBIEN) 5 MG tablet Take 1  tablet (5 mg total) by mouth at bedtime as needed.  90 tablet  1    Allergies  Allergen Reactions  . Chocolate   . Codeine   . Diazepam   . Iodine   . Iohexol      Code: HIVES, Desc: pt gets 13 hr pre-meds, Onset Date: [id number]   . Penicillins   . Strawberry   . Sulfonamide Derivatives   . Wheat     Shortness of breath      ROS:  13 systems were reviewed and are notable for vascular claudication.  All other review of systems are unremarkable.   Examination:  Filed Vitals:   01/25/12 1308  BP: 124/74  Pulse: 76     In general, well appearing older women.  Cardiovascular: The patient has a regular rate and rhythm and no carotid bruits.  Fundoscopy:  Disks are flat. Vessel caliber within normal limits.  Mental status:   The patient is oriented to person, place and time. Recent and remote memory are intact. Attention span and concentration are normal. Language including repetition, naming, following commands are intact. Fund of knowledge of current and historical events, as well as vocabulary are normal.  Cranial Nerves: Pupils are equally round and reactive to light. Visual fields full to confrontation in left eye, ++ decreased vision in right eye. EOMS are reveal some decreased adduction of the right eye(but has had muscle surgery). Facial sensation and muscles of mastication are intact. Muscles of facial expression are symmetric. Hearing intact to bilateral finger rub. Tongue protrusion, uvula, palate midline.  Shoulder shrug intact  Motor:  The patient has normal bulk and tone, no pronator drift.  There are no adventitious movements.  5/5 muscle strength bilaterally.  Reflexes:   Biceps  Triceps Brachioradialis Knee Ankle  Right 1+  2+  1+   3+ 1+  Left  1+  2+  1+   3+ 1+  Toes down  Coordination:  Normal finger to nose.  No dysdiadokinesia.  Sensation is intact to temperature and vibration bilaterally.  Gait and Station are normal.   Romberg is  negative  CT head was reviewed and is remarkable for encephalomalacia of the right anterior temporal lobe.  Impression/Recs: 78 year old with history of PSVT, symptomatic PAD who has two spells of altered  consciousness associated with abnormal feeling of motion and possible visual change lasting seconds.  Exam is largely unremarkable except for unrelated increased reflexes at the knees that suggest a possible unrelated myelopathic process.  History suggests syncope(I am particularly worried about cardiogenic syncope given her history) vs. seizure vs. TIA(posterior circulation).  I am going to get an MRI brain and MRA head and neck as we as an EEG.  I suggest that she speak to her cardiologist Dr. Tenny Craw to see if makes sense for the patient to have a Holter monitor to try to see if she is having any subclinical arrhythmias.  I of course don't think we will capture one of her spells, but given her history it may make sense to look more closely at her heart as a possible cause.  However, I will leave this determination up to Dr. Tenny Craw   We will see the patient back in 3.  Thank you for having [place] see Meade Maw in consultation.  Feel free to contact me with any questions.  Lupita Raider Modesto Charon, MD [facility], [place] [address] [place], [place] [address] Phone: [phone] Fax: [phone].

## 2012-01-26 ENCOUNTER — Telehealth: Payer: Self-pay | Admitting: Neurology

## 2012-01-26 NOTE — Telephone Encounter (Signed)
Pt is confused about her titration schedule. Can you call her to go over her instructions again?

## 2012-01-26 NOTE — Telephone Encounter (Signed)
Pt was confused about location of MRI/MRA.  Everything was clarified with her and she if fine.

## 2012-01-31 ENCOUNTER — Ambulatory Visit: Payer: Medicare Other | Admitting: Neurology

## 2012-02-02 ENCOUNTER — Ambulatory Visit (HOSPITAL_COMMUNITY)
Admission: RE | Admit: 2012-02-02 | Discharge: 2012-02-02 | Disposition: A | Discharge status: Home / Self Care | Payer: Medicare Other | Source: Ambulatory Visit | Attending: Neurology | Admitting: Neurology

## 2012-02-02 DIAGNOSIS — I6529 Occlusion and stenosis of unspecified carotid artery: Secondary | ICD-10-CM | POA: Insufficient documentation

## 2012-02-02 DIAGNOSIS — R55 Syncope and collapse: Secondary | ICD-10-CM

## 2012-02-02 DIAGNOSIS — R404 Transient alteration of awareness: Secondary | ICD-10-CM | POA: Insufficient documentation

## 2012-02-02 DIAGNOSIS — Z1389 Encounter for screening for other disorder: Secondary | ICD-10-CM | POA: Diagnosis not present

## 2012-02-02 DIAGNOSIS — I739 Peripheral vascular disease, unspecified: Secondary | ICD-10-CM | POA: Diagnosis not present

## 2012-02-02 DIAGNOSIS — Z8673 Personal history of transient ischemic attack (TIA), and cerebral infarction without residual deficits: Secondary | ICD-10-CM | POA: Insufficient documentation

## 2012-02-02 DIAGNOSIS — M948X9 Other specified disorders of cartilage, unspecified sites: Secondary | ICD-10-CM | POA: Diagnosis not present

## 2012-02-02 DIAGNOSIS — I672 Cerebral atherosclerosis: Secondary | ICD-10-CM | POA: Insufficient documentation

## 2012-02-02 DIAGNOSIS — R569 Unspecified convulsions: Secondary | ICD-10-CM

## 2012-02-02 DIAGNOSIS — R930 Abnormal findings on diagnostic imaging of skull and head, not elsewhere classified: Secondary | ICD-10-CM

## 2012-02-02 DIAGNOSIS — G459 Transient cerebral ischemic attack, unspecified: Secondary | ICD-10-CM | POA: Diagnosis not present

## 2012-02-02 DIAGNOSIS — I69998 Other sequelae following unspecified cerebrovascular disease: Secondary | ICD-10-CM | POA: Diagnosis not present

## 2012-02-02 DIAGNOSIS — I6509 Occlusion and stenosis of unspecified vertebral artery: Secondary | ICD-10-CM | POA: Diagnosis not present

## 2012-02-02 DIAGNOSIS — G9389 Other specified disorders of brain: Secondary | ICD-10-CM | POA: Insufficient documentation

## 2012-02-02 DIAGNOSIS — I771 Stricture of artery: Secondary | ICD-10-CM | POA: Diagnosis not present

## 2012-02-02 MED ORDER — GADOBENATE DIMEGLUMINE 529 MG/ML IV SOLN
10.0000 mL | Freq: Once | INTRAVENOUS | Status: AC | PRN
  Administered 2012-02-02: 7 mL via INTRAVENOUS

## 2012-02-08 NOTE — Procedures (Signed)
EEG NUMBER:  [id number].  This routine EEG was requested in this 78 year old woman where she was noted to have 2 episodes of feeling like she fell forwards.  There was no confusion following the episodes.  Her medications include no anticonvulsant.  She is on Ambien.  EEG was done with the patient awake.  During periods of maximal wakefulness, she had a poorly regulated poorly sustained 11 cycles per second posterior dominant rhythm that was of low to medium amplitude and symmetric.  Background activities are composed of mixed alpha and beta activities that were of low amplitude and moderately disorganized.  Photic stimulation produced symmetric driving response. Hyperventilation for 3 minutes with good effort did not markedly change the tracing.  The patient did not fall asleep.  CLINICAL INTERPRETATION:  This routine EEG done with the patient awake  is normal.          ______________________________ Denton Meek, MD    [id number] D:  02/08/2012 09:10:23  T:  02/08/2012 09:20:13  Job #:  [id number]

## 2012-02-10 ENCOUNTER — Telehealth: Payer: Self-pay | Admitting: Neurology

## 2012-02-11 MED ORDER — LEVETIRACETAM 250 MG PO TABS: 500.0000 mg | ORAL_TABLET | Freq: Two times a day (BID) | ORAL | Status: DC

## 2012-02-11 NOTE — Progress Notes (Signed)
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  MRA did reveal mild to mod M1 stenosis.  I am suspicious these events of sudden onset of dizziness may be seizures.  I have started the patient on Keppra 250->500 bid.  I have instructed her to follow up with Dr. Debby Bud about coming off her Wellbutrin.  She is not to drive for now.  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

## 2012-02-11 NOTE — Progress Notes (Signed)
Addended by: Milas Gain on: 02/11/2012 10:00 AM   Modules accepted: Orders

## 2012-02-21 ENCOUNTER — Ambulatory Visit: Payer: Medicare Other | Admitting: Neurology

## 2012-02-23 ENCOUNTER — Telehealth: Payer: Self-pay | Admitting: Internal Medicine

## 2012-02-23 NOTE — Telephone Encounter (Signed)
Pt is aware.  She will call when she can get a ride to make an appointment.

## 2012-02-23 NOTE — Telephone Encounter (Signed)
Pt requesting cipro  Be sent to [facility]--ear pain is the reason

## 2012-02-23 NOTE — Telephone Encounter (Signed)
No antibiotic w/o visit

## 2012-02-24 DIAGNOSIS — J00 Acute nasopharyngitis [common cold]: Secondary | ICD-10-CM | POA: Diagnosis not present

## 2012-02-24 DIAGNOSIS — R05 Cough: Secondary | ICD-10-CM | POA: Diagnosis not present

## 2012-02-24 DIAGNOSIS — J309 Allergic rhinitis, unspecified: Secondary | ICD-10-CM | POA: Diagnosis not present

## 2012-04-07 NOTE — Telephone Encounter (Signed)
Completed.

## 2012-04-14 ENCOUNTER — Other Ambulatory Visit: Payer: Self-pay | Admitting: *Deleted

## 2012-04-14 MED ORDER — BUPROPION HCL ER (XL) 150 MG PO TB24: 150.0000 mg | ORAL_TABLET | Freq: Every day | ORAL | Status: DC

## 2012-04-14 MED ORDER — ATENOLOL 50 MG PO TABS: 50.0000 mg | ORAL_TABLET | Freq: Every day | ORAL | Status: DC

## 2012-04-14 NOTE — Telephone Encounter (Signed)
Rx refill bupropion xl sent to [facility]

## 2012-04-14 NOTE — Telephone Encounter (Signed)
Refill atenolol to [facility]

## 2012-04-27 ENCOUNTER — Telehealth: Payer: Self-pay | Admitting: *Deleted

## 2012-04-27 NOTE — Telephone Encounter (Signed)
patient request refill on alprazolam. LOV 12/08/2011

## 2012-04-28 MED ORDER — ALPRAZOLAM 0.25 MG PO TABS: 0.2500 mg | ORAL_TABLET | Freq: Three times a day (TID) | ORAL | Status: DC | PRN

## 2012-04-28 NOTE — Telephone Encounter (Signed)
Refill alprazalam sent to [facility]. Patient notified. Left message on VM

## 2012-04-28 NOTE — Telephone Encounter (Signed)
o x 5

## 2012-05-01 ENCOUNTER — Other Ambulatory Visit: Payer: Self-pay | Admitting: General Practice

## 2012-05-01 DIAGNOSIS — F419 Anxiety disorder, unspecified: Secondary | ICD-10-CM

## 2012-05-01 MED ORDER — ALPRAZOLAM 0.25 MG PO TABS: 0.2500 mg | ORAL_TABLET | Freq: Three times a day (TID) | ORAL | Status: DC | PRN

## 2012-05-23 DIAGNOSIS — H5010 Unspecified exotropia: Secondary | ICD-10-CM | POA: Diagnosis not present

## 2012-05-23 DIAGNOSIS — R404 Transient alteration of awareness: Secondary | ICD-10-CM | POA: Diagnosis not present

## 2012-05-23 DIAGNOSIS — I498 Other specified cardiac arrhythmias: Secondary | ICD-10-CM | POA: Diagnosis not present

## 2012-05-23 DIAGNOSIS — G9389 Other specified disorders of brain: Secondary | ICD-10-CM | POA: Diagnosis not present

## 2012-05-30 DIAGNOSIS — G9389 Other specified disorders of brain: Secondary | ICD-10-CM | POA: Diagnosis not present

## 2012-06-20 ENCOUNTER — Other Ambulatory Visit: Payer: Self-pay | Admitting: *Deleted

## 2012-06-20 MED ORDER — CILOSTAZOL 100 MG PO TABS: ORAL_TABLET | ORAL | Status: DC

## 2012-06-23 ENCOUNTER — Other Ambulatory Visit: Payer: Self-pay | Admitting: *Deleted

## 2012-06-23 MED ORDER — DILTIAZEM HCL ER 180 MG PO CP24: 180.0000 mg | ORAL_CAPSULE | Freq: Every day | ORAL | Status: DC

## 2012-07-20 DIAGNOSIS — Z23 Encounter for immunization: Secondary | ICD-10-CM | POA: Diagnosis not present

## 2012-07-27 ENCOUNTER — Encounter: Payer: Self-pay | Admitting: Internal Medicine

## 2012-07-27 ENCOUNTER — Ambulatory Visit (INDEPENDENT_AMBULATORY_CARE_PROVIDER_SITE_OTHER): Payer: Medicare Other | Admitting: Internal Medicine

## 2012-07-27 VITALS — BP 112/60 | HR 81 | Temp 97.0°F | Resp 16 | Wt 125.0 lb

## 2012-07-27 DIAGNOSIS — R55 Syncope and collapse: Secondary | ICD-10-CM

## 2012-07-27 DIAGNOSIS — I10 Essential (primary) hypertension: Secondary | ICD-10-CM

## 2012-07-27 DIAGNOSIS — M549 Dorsalgia, unspecified: Secondary | ICD-10-CM | POA: Diagnosis not present

## 2012-07-27 MED ORDER — LEVETIRACETAM 250 MG PO TABS: 500.0000 mg | ORAL_TABLET | Freq: Two times a day (BID) | ORAL | Status: DC

## 2012-07-27 MED ORDER — TRAMADOL HCL 50 MG PO TABS: 50.0000 mg | ORAL_TABLET | Freq: Three times a day (TID) | ORAL | Status: DC | PRN

## 2012-07-27 NOTE — Patient Instructions (Addendum)
Neurology - I have reviewed both Dr. Maurice March note and Dr. Jamse Belfast at [facility]. The EEG report is being faxed to me. I believe we should continue the Keppra and refill is provided. Wellbutrin does have the potential to lower your seizure threshold but if the benefit is high this may out weigh the risk especially if you stay on the Keppra.  Back pain - there is a large bruise on the right back. Your back exam is normal. Plan  Stretch exercises  Tramadol 50 mg every 8 hours as needed for pain.  Blood pressure: BP Readings from Last 3 Encounters:  07/27/12 112/60  01/25/12 124/74  12/29/11 162/72   Very good control lately.   Last cholesterol panel in Feb '13 was excellent. Will recheck in Feb '14

## 2012-07-27 NOTE — Assessment & Plan Note (Addendum)
Evaluation noted in overview. She has been event free. Question of true nature of CNS - seizure disorder. I have reviewed both Dr. Maurice March note and Dr. Jamse Belfast at [facility]. The EEG report requested and has been faxed to me.   Plan  continue the Keppra and refill is provided. Wellbutrin does have the potential to lower your seizure threshold but if the benefit is high this may out weigh the risk especially if you stay on the Keppra.  Neurology follow-up - will refer to Dr. Marylou Flesher.  Addendum: EEG from [facility] - a normal study.

## 2012-07-27 NOTE — Assessment & Plan Note (Signed)
BP Readings from Last 3 Encounters:  07/27/12 112/60  01/25/12 124/74  12/29/11 162/72

## 2012-07-27 NOTE — Progress Notes (Signed)
Subjective:    Patient ID: Kayla Kent, female    DOB: 1932/04/29, 78 y.o.   MRN: [id number]  HPI Kayla Kent presents for follow -up of syncopal episodes thought to be seizure disorder. She has had two drop attacks with loss of memory. She was seen and evaluated by Dr Modesto Charon for neurology. MRI/MRA with some encephalomalacia thought to be due to remote stroke w/o acute changes. MRA was unrevealing. She has had an EEG at [facility] - results not located in EPIC. She was referred to Dr. Jamse Belfast, [facility] for second opinion in August '13. His note reviewed. He agreed with the seizure diagnosis and recommended continuation of Keppra. He ordered a sleep deprived EEG. Results were obtained - normal study. She has been doing well on Keppra - no adverse effects and she has not had any subsequent events.  She reports that several days ago she had the on-set of right sided back pain. She does not recall any precipitating event but she has been working in the garden. She has no radiation of discomfort to her legs, no focal weakness or paresthesia, no incontinence of bowel or bladder.   Past Medical History  Diagnosis Date  . Anxiety   . Paroxysmal supraventricular tachycardia   . MVP (mitral valve prolapse)     hx  . Uterine prolapse without mention of vaginal wall prolapse   . HTN (hypertension)   . HLD (hyperlipidemia)   . Intolerance of drug     orthostatic  . PVD (peripheral vascular disease)   . Allergic rhinitis    Past Surgical History  Procedure Date  . Corrective eye surgery     as a child  . Cholecystectomy   . Tonsillectomy   . Ivd removed   . Stress cardiolite 08/05/93  . Rf ablation psvt     summer '10   Family History  Problem Relation Age of Onset  . Coronary artery disease      family hx (also positive for lipids)  . Hypertension      family hx  . Colon cancer Neg Hx   . Breast cancer Neg Hx   . Diabetes Neg Hx   . Mental illness Father    suicide  . Arthritis Father   . Hyperlipidemia Sister   . Hypertension Sister   . Heart disease Brother     CAD/MI  . Hypertension Brother    History   Social History  . Marital Status: Widowed    Spouse Name: N/A    Number of Children: 2  . Years of Education: 18   Occupational History  . retired    Social History Main Topics  . Smoking status: Former Smoker    Quit date: 12/08/1955  . Smokeless tobacco: Never Used   Comment: quit in 1995   . Alcohol Use: No  . Drug Use: No  . Sexually Active: No   Other Topics Concern  . Not on file   Social History Narrative   HSG, [employer]-BA, [employer]-early childhood. Married '55 -51 years.  1 son - 64; 1 daughter - 74; 3 grandchildren . Lives alone. ACP - discussed and provided packet on end of life care (Feb '13)    Current Outpatient Prescriptions on File Prior to Visit  Medication Sig Dispense Refill  . ALPRAZolam (XANAX) 0.25 MG tablet Take 1 tablet (0.25 mg total) by mouth 3 (three) times daily as needed for anxiety.  180 tablet  3  .  aspirin 81 MG EC tablet Take 81 mg by mouth daily.        Marland Kitchen atenolol (TENORMIN) 50 MG tablet Take 1 tablet (50 mg total) by mouth daily.  90 tablet  3  . buPROPion (WELLBUTRIN XL) 150 MG 24 hr tablet Take 1 tablet (150 mg total) by mouth daily.  90 tablet  3  . cilostazol (PLETAL) 100 MG tablet TAKE 1/2 EVERY MORNING AND 1 EVERY EVENING  135 tablet  1  . ciprofloxacin (CIPRO) 500 MG tablet Take 500 mg by mouth 2 (two) times daily as needed. Take 1 before and 1 after DENTAL WORK.      Marland Kitchen diltiazem (DILACOR XR) 180 MG 24 hr capsule Take 1 capsule (180 mg total) by mouth daily.  90 capsule  3  . rosuvastatin (CRESTOR) 5 MG tablet TAKE 1 TABLET BY MOUTH EVERY NIGHT AT BEDTIME  90 tablet  1  . zolpidem (AMBIEN) 5 MG tablet Take 1 tablet (5 mg total) by mouth at bedtime as needed.  90 tablet  1  . DISCONTD: levETIRAcetam (KEPPRA) 250 MG tablet Take 2 tablets (500 mg total) by mouth 2 (two)  times daily. Please increase as per titration schedule  120 tablet  3      Review of Systems System review is negative for any constitutional, cardiac, pulmonary, GI or neuro symptoms or complaints other than as described in the HPI.     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 Derm - large bruise to the right back at the T9-12 level, posterior axillary line.         Assessment & Plan:  Back pain - there is a large bruise on the right back. Your back exam is normal. Plan  Stretch exercises  Tramadol 50 mg every 8 hours as needed for pain.

## 2012-09-22 ENCOUNTER — Telehealth (HOSPITAL_COMMUNITY): Payer: Self-pay | Admitting: Internal Medicine

## 2012-09-27 DIAGNOSIS — R404 Transient alteration of awareness: Secondary | ICD-10-CM | POA: Diagnosis not present

## 2012-10-09 ENCOUNTER — Other Ambulatory Visit: Payer: Self-pay | Admitting: *Deleted

## 2012-10-10 MED ORDER — BUPROPION HCL ER (XL) 150 MG PO TB24: 150.0000 mg | ORAL_TABLET | Freq: Every day | ORAL | Status: DC

## 2012-11-02 ENCOUNTER — Other Ambulatory Visit: Payer: Self-pay | Admitting: *Deleted

## 2012-11-02 DIAGNOSIS — F419 Anxiety disorder, unspecified: Secondary | ICD-10-CM

## 2012-11-03 ENCOUNTER — Other Ambulatory Visit: Payer: Self-pay | Admitting: *Deleted

## 2012-11-03 DIAGNOSIS — F419 Anxiety disorder, unspecified: Secondary | ICD-10-CM

## 2012-11-03 NOTE — Telephone Encounter (Signed)
I believe that she has changed doctors.

## 2012-11-04 MED ORDER — ALPRAZOLAM 0.25 MG PO TABS: 0.2500 mg | ORAL_TABLET | Freq: Three times a day (TID) | ORAL | Status: DC | PRN

## 2012-11-22 ENCOUNTER — Other Ambulatory Visit: Payer: Self-pay | Admitting: *Deleted

## 2012-11-22 DIAGNOSIS — F419 Anxiety disorder, unspecified: Secondary | ICD-10-CM

## 2012-11-22 NOTE — Telephone Encounter (Signed)
I believe she changed physicians to Dr. Yetta Barre.

## 2012-11-23 ENCOUNTER — Other Ambulatory Visit: Payer: Self-pay | Admitting: *Deleted

## 2012-11-23 MED ORDER — ROSUVASTATIN CALCIUM 5 MG PO TABS: ORAL_TABLET | ORAL | Status: DC

## 2012-11-24 ENCOUNTER — Other Ambulatory Visit: Payer: Self-pay | Admitting: *Deleted

## 2012-11-27 MED ORDER — ZOLPIDEM TARTRATE 5 MG PO TABS: 5.0000 mg | ORAL_TABLET | Freq: Every evening | ORAL | Status: DC | PRN

## 2012-12-14 ENCOUNTER — Other Ambulatory Visit: Payer: Self-pay | Admitting: *Deleted

## 2012-12-14 MED ORDER — CILOSTAZOL 100 MG PO TABS: ORAL_TABLET | ORAL | Status: DC

## 2012-12-14 NOTE — Telephone Encounter (Signed)
Refilled rx and pt called requesting it be sent to [facility] on [address].

## 2013-01-22 ENCOUNTER — Other Ambulatory Visit: Payer: Self-pay | Admitting: Internal Medicine

## 2013-02-20 ENCOUNTER — Other Ambulatory Visit: Payer: Self-pay | Admitting: Internal Medicine

## 2013-03-21 ENCOUNTER — Other Ambulatory Visit: Payer: Self-pay | Admitting: Internal Medicine

## 2013-03-28 ENCOUNTER — Ambulatory Visit (INDEPENDENT_AMBULATORY_CARE_PROVIDER_SITE_OTHER): Payer: Medicare Other | Admitting: Neurology

## 2013-03-28 ENCOUNTER — Encounter: Payer: Self-pay | Admitting: Neurology

## 2013-03-28 VITALS — BP 136/55 | HR 67 | Temp 97.9°F | Ht 70.0 in | Wt 128.0 lb

## 2013-03-28 DIAGNOSIS — R569 Unspecified convulsions: Secondary | ICD-10-CM | POA: Diagnosis not present

## 2013-03-28 NOTE — Progress Notes (Signed)
[facility]  Provider:  Dr Vickey Huger Referring Provider: Jacques Navy, MD Primary Care Physician:  Illene Regulus, MD  Chief Complaint  Patient presents with  . Follow-up    per EMR, rm 11    HPI:  Kayla Kent is a 79 y.o. female here as a  Follow up  Upon initial referral from Dr. Debby Bud for syncope. The patient was originally sent to [facility] in 2013 foot evaluation of possible seizures cord" the first spell occurred in March 2013 in the [place].   The patient was sitting on a bench in the evening watching relatives play ball and suddenly had " this feeling of being pulled forward" ,  her vision changed to gray,  she could hear people around her but could not respond.  Her daughter witnessed the event and stated that her mother did not fall from the chair but she just her hands back and did not come forward for seconds.  The patient underwent a glucose check and electrolyte checked which were normal.  Then she had a second event doing at track meet ,watching her grandson run and the same spell  happened to her. She recalls a woman, standing near to her, who  was a nurse  And checked her heart beat.   She came quickly back to normal  level of awareness .She also overheard that another person in the bleachers that she call 911 and she responded verbally no. There was no blastic or confusion impairment or any other symptoms there were no cardiac events after  Dr. Debby Bud evaluated the Patient.   Again,  differential diagnosis included syncope. Yet , Dr. Modesto Charon told her that he did not establish a diagnosis but he started her on Keppra ,  ( for seizures ) she worked up to 2 tablets in the morning and 2 in p.m. she had no side effects reported at the time she had an MRI of the brain with chills and right anterior temporal encephalomalacia and an EEG at multiple hospital  The patient had no neurologic history and on neurologic  family history her father has arthritis and migraine a brother had coronary artery disease and heart attack.  Her mother died suddenly but had a history of heavy smoking and to the rectus course of death was not known. I reviewed the MRI from 02/02/2012 , no acute intracranial abnormality was noted,  left greater than right  intracerebral  encephalomalcia with   calcification.  MRA :  no significant stenosis of the carotid arteries. Moderate vertebral artery stenosis.    The patient then presented to me in December 2013 and she was on the same doses of Keppra  at the time that I saw her. she is now taking 2 times  a day , a generic form 250 mg tablets : 2 in the morning 2 at night she also is on Crestor, the chiasm, and brought beyond aspirin.  Been no further spells. She is aware that discontinuing or weaning her  Keppra would mean driving restrictions for 6 month and prefers to continue.   Review of Systems: Out of a complete 14 system review, the patient complains of only the following symptoms, and all other reviewed systems are negative. None   History   Social History  . Marital Status: Widowed    Spouse Name: N/A    Number of Children: 2  . Years of Education: 18   Occupational History  .  retired    Social History Main Topics  . Smoking status: Former Smoker    Quit date: 12/08/1955  . Smokeless tobacco: Never Used     Comment: quit in 1995   . Alcohol Use: No  . Drug Use: No  . Sexually Active: No   Other Topics Concern  . Not on file   Social History Narrative   HSG, [employer]-BA, [employer]-early childhood. Married '55 -51 years.  1 son - 74; 1 daughter - 51; 3 grandchildren . Lives alone. ACP - discussed and provided packet on end of life care (Feb '13)    Family History  Problem Relation Age of Onset  . Coronary artery disease      family hx (also positive for lipids)  . Hypertension      family hx  . Colon cancer Neg Hx   . Breast cancer Neg Hx    . Diabetes Neg Hx   . Mental illness Father     suicide  . Arthritis Father   . Hyperlipidemia Sister   . Hypertension Sister   . Heart disease Brother     CAD/MI  . Hypertension Brother     Past Medical History  Diagnosis Date  . Anxiety   . Paroxysmal supraventricular tachycardia   . MVP (mitral valve prolapse)     hx  . Uterine prolapse without mention of vaginal wall prolapse   . HTN (hypertension)   . HLD (hyperlipidemia)   . Intolerance of drug     orthostatic  . PVD (peripheral vascular disease)   . Allergic rhinitis   . Syncope and collapse     Past Surgical History  Procedure Laterality Date  . Corrective eye surgery      as a child  . Cholecystectomy    . Tonsillectomy    . Ivd removed    . Stress cardiolite  08/05/93  . Rf ablation psvt      summer '10    Current Outpatient Prescriptions  Medication Sig Dispense Refill  . ALPRAZolam (XANAX) 0.25 MG tablet Take 1 tablet (0.25 mg total) by mouth 3 (three) times daily as needed for anxiety.  180 tablet  3  . aspirin 81 MG EC tablet Take 81 mg by mouth daily.        Marland Kitchen atenolol (TENORMIN) 50 MG tablet Take 1 tablet (50 mg total) by mouth daily.  90 tablet  3  . buPROPion (WELLBUTRIN XL) 150 MG 24 hr tablet Take 1 tablet (150 mg total) by mouth daily.  90 tablet  3  . cilostazol (PLETAL) 100 MG tablet TAKE 1/2 EVERY MORNING AND 1 EVERY EVENING  135 tablet  0  . ciprofloxacin (CIPRO) 500 MG tablet Take 500 mg by mouth 2 (two) times daily as needed. Take 1 before and 1 after DENTAL WORK.      Marland Kitchen diltiazem (DILACOR XR) 180 MG 24 hr capsule Take 1 capsule (180 mg total) by mouth daily.  90 capsule  3  . levETIRAcetam (KEPPRA) 250 MG tablet TAKE TWO TABLETS BY MOUTH TWICE DAILY THEN TITRATE PER TITRATION SCHEDULE  120 tablet  0  . rosuvastatin (CRESTOR) 5 MG tablet TAKE 1 TABLET BY MOUTH EVERY NIGHT AT BEDTIME  90 tablet  1  . traMADol (ULTRAM) 50 MG tablet Take 1 tablet (50 mg total) by mouth every 8 (eight) hours  as needed for pain.  30 tablet  1  . zolpidem (AMBIEN) 5 MG tablet Take 1 tablet (5  mg total) by mouth at bedtime as needed.  90 tablet  1   No current facility-administered medications for this visit.    Allergies as of 03/28/2013 - Review Complete 03/28/2013  Allergen Reaction Noted  . Chocolate  01/25/2012  . Codeine    . Diazepam    . Fruit & vegetable daily (nutritional supplements) Hives and Swelling 03/28/2013  . Iodine    . Iohexol  09/10/2008  . Peanut-containing drug products Hives and Swelling 03/28/2013  . Penicillins    . Strawberry  12/08/2011  . Sulfonamide derivatives    . Wheat  10/01/2011    Vitals: BP 136/55  Pulse 67  Temp(Src) 97.9 F (36.6 C) (Oral)  Ht 5\' 10"  (1.778 m)  Wt 128 lb (58.06 kg)  BMI 18.37 kg/m2 Last Weight:  Wt Readings from Last 1 Encounters:  03/28/13 128 lb (58.06 kg)   Last Height:   Ht Readings from Last 1 Encounters:  03/28/13 5\' 10"  (1.778 m)   Vision Screening:  Left eye with correction 20-25 .  Right eye with correction - 20-300.  Physical exam:  General: The patient is awake, alert and appears not in acute distress. The patient is well groomed. Head: Normocephalic, atraumatic. Neck is supple.  Cardiovascular:  Regular rate and rhythm, without  murmurs or carotid bruit, and without distended neck veins. Respiratory: Lungs are clear to auscultation. Skin:  Without evidence of edema, or rash Trunk:Has normal posture.  Neurologic exam : The patient is awake and alert, oriented to place and time.  Memory subjective  described as intact.  There is a normal attention span & concentration ability. Speech is fluent without dysarthria, dysphonia or aphasia. Mood and affect are appropriate.  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  Facial sensation intact to fine touch.  Facial motor strength is symmetric and tongue and uvula move midline.  Motor exam:   Normal tone and normal muscle bulk and symmetric normal strength in all extremities.  Sensory:  Fine touch, pinprick and vibration were tested in all extremities. Proprioception is tested in the upper extremities only  normal.  Coordination: Rapid alternating movements in the fingers/hands is tested and normal. Finger-to-nose maneuver tested and normal without evidence of ataxia, dysmetria or tremor.  Gait and station: Patient walks without assistive device . Stance is stable and normal. Tandem gait is  unfragmented.  Deep tendon reflexes: in the  upper and lower extremities are symmetric and intact. Babinski maneuver response is  downgoing.   Assessment:  After physical and neurologic examination, review of laboratory studies, imaging, neurophysiology testing and pre-existing records, assessment will be reviewed on the problem list.  Plan:  Treatment plan and additional workup will be reviewed under Problem List.   Patient  will continue on her current dose of 500 mg Keppra  twice a day  1) no recurrent event  #2 no serious side effects or not any side effects at all  #3 discontinuation of the medication would mean driving restrictions.  Rv  prn .

## 2013-03-28 NOTE — Patient Instructions (Signed)
Rv prn

## 2013-04-22 ENCOUNTER — Other Ambulatory Visit: Payer: Self-pay | Admitting: Internal Medicine

## 2013-05-08 ENCOUNTER — Other Ambulatory Visit: Payer: Self-pay | Admitting: Internal Medicine

## 2013-05-09 NOTE — Telephone Encounter (Signed)
Alprazolam called to pharmacy  

## 2013-05-15 ENCOUNTER — Other Ambulatory Visit: Payer: Self-pay | Admitting: Internal Medicine

## 2013-05-22 ENCOUNTER — Other Ambulatory Visit: Payer: Self-pay | Admitting: Internal Medicine

## 2013-06-08 ENCOUNTER — Other Ambulatory Visit: Payer: Self-pay | Admitting: Internal Medicine

## 2013-06-18 ENCOUNTER — Other Ambulatory Visit: Payer: Self-pay | Admitting: Internal Medicine

## 2013-06-21 ENCOUNTER — Other Ambulatory Visit: Payer: Self-pay | Admitting: Internal Medicine

## 2013-06-28 ENCOUNTER — Other Ambulatory Visit: Payer: Self-pay | Admitting: Internal Medicine

## 2013-07-03 ENCOUNTER — Other Ambulatory Visit: Payer: Self-pay | Admitting: Internal Medicine

## 2013-07-03 ENCOUNTER — Telehealth: Payer: Self-pay

## 2013-07-03 NOTE — Telephone Encounter (Signed)
Received a fax from [facility] [phone] stating patient is requesting 10 mg of generic Ambien instead of 5 mg . Please advise. She does not use [facility] at this time.

## 2013-07-03 NOTE — Telephone Encounter (Signed)
Best practice: for patients over 65 maximum recommended dose for zolpidem is 5 mg.

## 2013-07-03 NOTE — Telephone Encounter (Signed)
Faxed pharmacy back this info [phone]

## 2013-07-04 NOTE — Telephone Encounter (Signed)
Already responded to this request: best practice for geriatric patients - zolpidem limited to 5 mg

## 2013-07-05 ENCOUNTER — Other Ambulatory Visit: Payer: Self-pay | Admitting: Internal Medicine

## 2013-07-09 ENCOUNTER — Ambulatory Visit: Payer: Medicare Other | Admitting: Internal Medicine

## 2013-07-13 ENCOUNTER — Other Ambulatory Visit: Payer: Self-pay | Admitting: Internal Medicine

## 2013-07-16 ENCOUNTER — Other Ambulatory Visit: Payer: Self-pay | Admitting: Internal Medicine

## 2013-07-16 NOTE — Telephone Encounter (Signed)
Alprazolam called to pharmacy  

## 2013-07-19 ENCOUNTER — Other Ambulatory Visit: Payer: Self-pay | Admitting: Internal Medicine

## 2013-08-13 ENCOUNTER — Other Ambulatory Visit: Payer: Self-pay | Admitting: Internal Medicine

## 2013-08-18 ENCOUNTER — Other Ambulatory Visit: Payer: Self-pay | Admitting: Internal Medicine

## 2013-08-30 DIAGNOSIS — Z23 Encounter for immunization: Secondary | ICD-10-CM | POA: Diagnosis not present

## 2013-08-31 ENCOUNTER — Other Ambulatory Visit: Payer: Self-pay

## 2013-08-31 ENCOUNTER — Other Ambulatory Visit: Payer: Self-pay | Admitting: Internal Medicine

## 2013-08-31 MED ORDER — BUPROPION HCL ER (XL) 150 MG PO TB24: 150.0000 mg | ORAL_TABLET | Freq: Every day | ORAL | Status: DC

## 2013-09-16 ENCOUNTER — Other Ambulatory Visit: Payer: Self-pay | Admitting: Internal Medicine

## 2013-09-17 ENCOUNTER — Other Ambulatory Visit: Payer: Self-pay | Admitting: Internal Medicine

## 2013-09-26 ENCOUNTER — Other Ambulatory Visit: Payer: Self-pay | Admitting: Internal Medicine

## 2013-10-16 ENCOUNTER — Other Ambulatory Visit: Payer: Self-pay | Admitting: Internal Medicine

## 2013-11-17 ENCOUNTER — Other Ambulatory Visit: Payer: Self-pay | Admitting: Internal Medicine

## 2013-11-25 ENCOUNTER — Other Ambulatory Visit: Payer: Self-pay | Admitting: Internal Medicine

## 2013-11-26 ENCOUNTER — Other Ambulatory Visit: Payer: Self-pay | Admitting: Internal Medicine

## 2013-11-29 ENCOUNTER — Other Ambulatory Visit: Payer: Self-pay | Admitting: Internal Medicine

## 2013-12-20 ENCOUNTER — Other Ambulatory Visit: Payer: Self-pay | Admitting: Internal Medicine

## 2013-12-21 ENCOUNTER — Encounter: Payer: Self-pay | Admitting: Internal Medicine

## 2013-12-21 ENCOUNTER — Ambulatory Visit (INDEPENDENT_AMBULATORY_CARE_PROVIDER_SITE_OTHER): Payer: Medicare Other | Admitting: Internal Medicine

## 2013-12-21 VITALS — BP 114/52 | HR 61 | Temp 96.8°F | Wt 123.4 lb

## 2013-12-21 DIAGNOSIS — I509 Heart failure, unspecified: Secondary | ICD-10-CM

## 2013-12-21 DIAGNOSIS — R55 Syncope and collapse: Secondary | ICD-10-CM

## 2013-12-21 DIAGNOSIS — F411 Generalized anxiety disorder: Secondary | ICD-10-CM

## 2013-12-21 MED ORDER — ZOLPIDEM TARTRATE 5 MG PO TABS: 5.0000 mg | ORAL_TABLET | Freq: Every evening | ORAL | Status: DC | PRN

## 2013-12-21 MED ORDER — LEVETIRACETAM 250 MG PO TABS: ORAL_TABLET | ORAL | Status: DC

## 2013-12-21 NOTE — Progress Notes (Signed)
Pre visit review using our clinic review tool, if applicable. No additional management support is needed unless otherwise documented below in the visit note. 

## 2013-12-21 NOTE — Patient Instructions (Signed)
Thanks for the years of your confidence.  Your care will be assumed by Dr. Cathlean Cower - this will happen in the system.   Rx's done

## 2013-12-24 NOTE — Progress Notes (Signed)
Subjective:    Patient ID: Kayla Kent, female    DOB: 19-Jan-1932, 80 y.o.   MRN: [id number]  HPI Mrs. Scaduto presents for follow up anxiety and seizure disorder. She had seizure 2 years ago. Dr. Beacher May is her neurologist and started her on [place]. She has not had any further seizures. She does have mild chronic anxiety that is well controlled.   Past Medical History  Diagnosis Date  . Anxiety   . Paroxysmal supraventricular tachycardia   . MVP (mitral valve prolapse)     hx  . Uterine prolapse without mention of vaginal wall prolapse   . HTN (hypertension)   . HLD (hyperlipidemia)   . Intolerance of drug     orthostatic  . PVD (peripheral vascular disease)   . Allergic rhinitis   . Syncope and collapse    Past Surgical History  Procedure Laterality Date  . Corrective eye surgery      as a child  . Cholecystectomy    . Tonsillectomy    . Ivd removed    . Stress cardiolite  08/05/93  . Rf ablation psvt      summer '10   Family History  Problem Relation Age of Onset  . Coronary artery disease      family hx (also positive for lipids)  . Hypertension      family hx  . Colon cancer Neg Hx   . Breast cancer Neg Hx   . Diabetes Neg Hx   . Mental illness Father     suicide  . Arthritis Father   . Hyperlipidemia Sister   . Hypertension Sister   . Heart disease Brother     CAD/MI  . Hypertension Brother    History   Social History  . Marital Status: Widowed    Spouse Name: N/A    Number of Children: 2  . Years of Education: 18   Occupational History  . retired    Social History Main Topics  . Smoking status: Former Smoker    Quit date: 12/08/1955  . Smokeless tobacco: Never Used     Comment: quit in 1995   . Alcohol Use: No  . Drug Use: No  . Sexual Activity: No   Other Topics Concern  . Not on file   Social History Narrative   HSG, [employer]-BA, [employer]-early childhood. Married '55 -53 years.  1 son - 19; 1 daughter - 21; 3  grandchildren . Lives alone. ACP - discussed and provided packet on end of life care (Feb '13)     Current Outpatient Prescriptions on File Prior to Visit  Medication Sig Dispense Refill  . ALPRAZolam (XANAX) 0.25 MG tablet TAKE ONE TABLET BY MOUTH THREE TIMES DAILY  180 tablet  0  . aspirin 81 MG EC tablet Take 81 mg by mouth daily.        Marland Kitchen atenolol (TENORMIN) 50 MG tablet TAKE ONE TABLET BY MOUTH ONCE DAILY  90 tablet  3  . buPROPion (WELLBUTRIN XL) 150 MG 24 hr tablet Take 1 tablet (150 mg total) by mouth daily.  90 tablet  3  . cilostazol (PLETAL) 100 MG tablet TAKE ONE-HALF TABLET BY MOUTH IN THE MORNING AND  ONE TABLET  IN THE EVENING-OVER DUE FOR VISIT  135 tablet  0  . ciprofloxacin (CIPRO) 500 MG tablet Take 500 mg by mouth 2 (two) times daily as needed. Take 1 before and 1 after DENTAL WORK.      Marland Kitchen  CRESTOR 5 MG tablet TAKE ONE TABLET BY MOUTH AT BEDTIME-NEEDS YEARLY EXAM  90 tablet  1  . diltiazem (DILACOR XR) 180 MG 24 hr capsule TAKE ONE CAPSULE BY MOUTH EVERY DAY  240 capsule  0   No current facility-administered medications on file prior to visit.      Review of Systems Constitutional:  Negative for fever, chills, activity change and unexpected weight change.  HEENT:  Negative for hearing loss, ear pain, congestion, neck stiffness and postnasal drip. Negative for sore throat or swallowing problems. Negative for dental complaints.   Eyes: Negative for vision loss or change in visual acuity.  Respiratory: Negative for chest tightness and wheezing. Negative for DOE.   Cardiovascular: Negative for chest pain or palpitations. No decreased exercise tolerance Gastrointestinal: No change in bowel habit. No bloating or gas. No reflux or indigestion Genitourinary: Negative for urgency, frequency, flank pain and difficulty urinating.  Musculoskeletal: Negative for myalgias, back pain, arthralgias and gait problem.  Neurological: Negative for dizziness, tremors, weakness and  headaches.  Hematological: Negative for adenopathy.  Psychiatric/Behavioral: Negative for behavioral problems and dysphoric mood.       Objective:   Physical Exam Filed Vitals:   12/21/13 1142  BP: 114/52  Pulse: 61  Temp: 96.8 F (36 C)   Wt Readings from Last 3 Encounters:  12/21/13 123 lb 6.4 oz (55.974 kg)  03/28/13 128 lb (58.06 kg)  07/27/12 125 lb (56.7 kg)   BP Readings from Last 3 Encounters:  12/21/13 114/52  03/28/13 136/55  07/27/12 112/60   Gen'l- WNWD woman in no acute distress HEENT - chronic disconjugate gaze with OD lateral. Cor - RRR Pulm - normal rspirations Neuro - A&O x 3, CN II-XII normal, MS 5/5, cerebellar - no tremor, normal gait.        Assessment & Plan:

## 2013-12-24 NOTE — Assessment & Plan Note (Signed)
No recurrent episodes of syncope or change in LOC. She has been tolerating Keppra. Did review MRI scans, EEG reports and consult notes.  Plan Keppra refilled.

## 2013-12-24 NOTE — Assessment & Plan Note (Signed)
Stable. No evidence of decompensation on exam today.

## 2013-12-24 NOTE — Assessment & Plan Note (Signed)
Stable and doing well on her current medical regimen.  Plan Refill Rx provided.

## 2014-01-16 ENCOUNTER — Telehealth: Payer: Self-pay

## 2014-01-16 MED ORDER — LEVETIRACETAM 250 MG PO TABS: ORAL_TABLET | ORAL | Status: DC

## 2014-01-16 NOTE — Telephone Encounter (Signed)
[facility] for routine refill, pt last seen Dec 18 2013

## 2014-01-16 NOTE — Telephone Encounter (Signed)
The patient  (Dr. Linda Hedges pt.) called to inform she will be completely out of her Levetriacetam  In 01/24/14.  States It was orginally prescribed by a neurologist for seizures and Dr. Linda Hedges took over filling for her.  [facility] [address].  Advise please on refill.  The patient is going out of town tomorrow and returning on the 10th.  Call back number is [phone]

## 2014-01-16 NOTE — Telephone Encounter (Signed)
Refill done as requested.  Called the patient to inform (left detailed message).

## 2014-01-21 ENCOUNTER — Other Ambulatory Visit: Payer: Self-pay

## 2014-01-21 MED ORDER — LEVETIRACETAM 250 MG PO TABS: ORAL_TABLET | ORAL | Status: DC

## 2014-02-08 ENCOUNTER — Encounter: Payer: Self-pay | Admitting: Internal Medicine

## 2014-02-08 ENCOUNTER — Ambulatory Visit (INDEPENDENT_AMBULATORY_CARE_PROVIDER_SITE_OTHER): Payer: Medicare Other | Admitting: Internal Medicine

## 2014-02-08 ENCOUNTER — Other Ambulatory Visit: Payer: Medicare Other

## 2014-02-08 VITALS — BP 120/60 | HR 64 | Temp 98.0°F | Ht 67.0 in | Wt 122.2 lb

## 2014-02-08 DIAGNOSIS — Z79899 Other long term (current) drug therapy: Secondary | ICD-10-CM | POA: Diagnosis not present

## 2014-02-08 DIAGNOSIS — Z23 Encounter for immunization: Secondary | ICD-10-CM | POA: Diagnosis not present

## 2014-02-08 DIAGNOSIS — I10 Essential (primary) hypertension: Secondary | ICD-10-CM | POA: Diagnosis not present

## 2014-02-08 DIAGNOSIS — F329 Major depressive disorder, single episode, unspecified: Secondary | ICD-10-CM | POA: Diagnosis not present

## 2014-02-08 DIAGNOSIS — F3289 Other specified depressive episodes: Secondary | ICD-10-CM

## 2014-02-08 DIAGNOSIS — E785 Hyperlipidemia, unspecified: Secondary | ICD-10-CM

## 2014-02-08 MED ORDER — DILTIAZEM HCL ER 180 MG PO CP24: ORAL_CAPSULE | ORAL | Status: DC

## 2014-02-08 MED ORDER — ROSUVASTATIN CALCIUM 5 MG PO TABS: ORAL_TABLET | ORAL | Status: DC

## 2014-02-08 MED ORDER — ALPRAZOLAM 0.25 MG PO TABS: ORAL_TABLET | ORAL | Status: DC

## 2014-02-08 NOTE — Progress Notes (Signed)
Pre visit review using our clinic review tool, if applicable. No additional management support is needed unless otherwise documented below in the visit note. 

## 2014-02-08 NOTE — Patient Instructions (Signed)
You had the new Prevnar pneumonia shot today  Please continue all other medications as before, and refills have been done if requested.  Please have the pharmacy call with any other refills you may need.  Please continue your efforts at being more active, low cholesterol diet, and weight control.  You are otherwise up to date with prevention measures today.  Please go to the LAB in the Basement (turn left off the elevator) for the tests to be done today  You will be contacted by phone if any changes need to be made immediately.  Otherwise, you will receive a letter about your results with an explanation, but please check with MyChart first.  Please remember to sign up for MyChart if you have not done so, as this will be important to you in the future with finding out test results, communicating by private email, and scheduling acute appointments online when needed.  Please return in 6 months, or sooner if needed

## 2014-02-08 NOTE — Assessment & Plan Note (Signed)
stable overall by history and exam, recent data reviewed with pt, and pt to continue medical treatment as before,  to f/u any worsening symptoms or concerns BP Readings from Last 3 Encounters:  02/08/14 120/60  12/21/13 114/52  03/28/13 136/55

## 2014-02-08 NOTE — Assessment & Plan Note (Signed)
stable overall by history and exam, and pt to continue medical treatment as before,  to f/u any worsening symptoms or concerns 

## 2014-02-08 NOTE — Progress Notes (Signed)
Subjective:    Patient ID: Kayla Kent, female    DOB: February 07, 1932, 80 y.o.   MRN: [id number]  HPI  Here to establish care with new PCP, Here to f/u; overall doing ok,  Pt denies chest pain, increased sob or doe, wheezing, orthopnea, PND, increased LE swelling, palpitations, dizziness or syncope.  Pt denies polydipsia, polyuria, or low sugar symptoms such as weakness or confusion improved with po intake.  Pt denies new neurological symptoms such as new headache, or facial or extremity weakness or numbness.   Pt states overall good compliance with meds, has been trying to follow lower cholesterol, diabetic diet, with wt overall stable,  but little exercise however.  No recent siezure.  Denies worsening depressive symptoms, suicidal ideation, or panic; has ongoing anxiety, not increased recently.  Past Medical History  Diagnosis Date  . Anxiety   . Paroxysmal supraventricular tachycardia   . MVP (mitral valve prolapse)     hx  . Uterine prolapse without mention of vaginal wall prolapse   . HTN (hypertension)   . HLD (hyperlipidemia)   . Intolerance of drug     orthostatic  . PVD (peripheral vascular disease)   . Allergic rhinitis   . Syncope and collapse    Past Surgical History  Procedure Laterality Date  . Corrective eye surgery      as a child  . Cholecystectomy    . Tonsillectomy    . Ivd removed    . Stress cardiolite  08/05/93  . Rf ablation psvt      summer '10    reports that she quit smoking about 58 years ago. She has never used smokeless tobacco. She reports that she does not drink alcohol or use illicit drugs. family history includes Arthritis in her father; Coronary artery disease in an other family member; Heart disease in her brother; Hyperlipidemia in her sister; Hypertension in her brother, sister, and another family member; Mental illness in her father. There is no history of Colon cancer, Breast cancer, or Diabetes. Allergies  Allergen Reactions  . Chocolate     . Codeine   . Diazepam   . Fruit & Vegetable Daily [Nutritional Supplements] Hives and Swelling    peaches  . Iodine   . Iohexol      Code: HIVES, Desc: pt gets 13 hr pre-meds, Onset Date: 62229798   . Peanut-Containing Drug Products Hives and Swelling  . Penicillins   . Strawberry   . Sulfonamide Derivatives   . Wheat     Shortness of breath   Current Outpatient Prescriptions on File Prior to Visit  Medication Sig Dispense Refill  . ALPRAZolam (XANAX) 0.25 MG tablet TAKE ONE TABLET BY MOUTH THREE TIMES DAILY  180 tablet  0  . aspirin 81 MG EC tablet Take 81 mg by mouth daily.        Marland Kitchen atenolol (TENORMIN) 50 MG tablet TAKE ONE TABLET BY MOUTH ONCE DAILY  90 tablet  3  . buPROPion (WELLBUTRIN XL) 150 MG 24 hr tablet Take 1 tablet (150 mg total) by mouth daily.  90 tablet  3  . cilostazol (PLETAL) 100 MG tablet TAKE ONE-HALF TABLET BY MOUTH IN THE MORNING AND  ONE TABLET  IN THE EVENING-OVER DUE FOR VISIT  135 tablet  0  . ciprofloxacin (CIPRO) 500 MG tablet Take 500 mg by mouth 2 (two) times daily as needed. Take 1 before and 1 after DENTAL WORK.      Marland Kitchen CRESTOR 5  MG tablet TAKE ONE TABLET BY MOUTH AT BEDTIME-NEEDS YEARLY EXAM  90 tablet  1  . diltiazem (DILACOR XR) 180 MG 24 hr capsule TAKE ONE CAPSULE BY MOUTH EVERY DAY  240 capsule  0  . levETIRAcetam (KEPPRA) 250 MG tablet TAKE TWO TABLETS BY MOUTH TWICE DAILY-THEN BEGIN TITRATE PER TITRATION SCHEDULE-NEEDS TO BE SEEN  120 tablet  0  . zolpidem (AMBIEN) 5 MG tablet Take 1 tablet (5 mg total) by mouth at bedtime as needed.  30 tablet  3   No current facility-administered medications on file prior to visit.   Review of Systems  Constitutional: Negative for unusual diaphoresis or other sweats  HENT: Negative for ringing in ear Eyes: Negative for double vision or worsening visual disturbance.  Respiratory: Negative for choking and stridor.   Gastrointestinal: Negative for vomiting or other signifcant bowel change Genitourinary:  Negative for hematuria or decreased urine volume.  Musculoskeletal: Negative for other MSK pain or swelling Skin: Negative for color change and worsening wound.  Neurological: Negative for tremors and numbness other than noted  Psychiatric/Behavioral: Negative for decreased concentration or agitation other than above       Objective:   Physical Exam BP 120/60  Pulse 64  Temp(Src) 98 F (36.7 C) (Oral)  Ht 5\' 7"  (1.702 m)  Wt 122 lb 3.2 oz (55.43 kg)  BMI 19.13 kg/m2  SpO2 95% VS noted,  Constitutional: Pt appears well-developed, well-nourished.  HENT: Head: NCAT.  Right Ear: External ear normal.  Left Ear: External ear normal.  Eyes: . Pupils are equal, round, and reactive to light. Conjunctivae and EOM are normal Neck: Normal range of motion. Neck supple.  Cardiovascular: Normal rate and regular rhythm.   Pulmonary/Chest: Effort normal and breath sounds normal.  Abd:  Soft, NT, ND, + BS Neurological: Pt is alert. Not confused , motor grossly intact Skin: Skin is warm. No rash Psychiatric: Pt behavior is normal. No agitation. mild nervous, not depressed affect    Assessment & Plan:

## 2014-02-08 NOTE — Assessment & Plan Note (Signed)
stable overall by history and exam, recent data reviewed with pt, and pt to continue medical treatment as before,  to f/u any worsening symptoms or concerns, For labs today  Lab Results  Component Value Date   CHOL 146 12/08/2011   HDL 48.50 12/08/2011   LDLCALC 78 12/08/2011   LDLDIRECT 175.3 01/19/2008   TRIG 100.0 12/08/2011   CHOLHDL 3 12/08/2011

## 2014-02-11 ENCOUNTER — Telehealth: Payer: Self-pay | Admitting: Internal Medicine

## 2014-02-11 NOTE — Telephone Encounter (Signed)
Relevant patient education mailed to patient.  

## 2014-02-20 ENCOUNTER — Other Ambulatory Visit: Payer: Self-pay

## 2014-02-20 MED ORDER — CILOSTAZOL 100 MG PO TABS: ORAL_TABLET | ORAL | Status: DC

## 2014-03-05 ENCOUNTER — Encounter: Payer: Self-pay | Admitting: Internal Medicine

## 2014-03-28 ENCOUNTER — Encounter (INDEPENDENT_AMBULATORY_CARE_PROVIDER_SITE_OTHER): Payer: Self-pay

## 2014-03-28 ENCOUNTER — Ambulatory Visit (INDEPENDENT_AMBULATORY_CARE_PROVIDER_SITE_OTHER): Payer: Medicare Other | Admitting: Neurology

## 2014-03-28 ENCOUNTER — Encounter: Payer: Self-pay | Admitting: Neurology

## 2014-03-28 VITALS — BP 129/58 | HR 65 | Resp 15 | Ht 65.0 in | Wt 126.0 lb

## 2014-03-28 DIAGNOSIS — R6889 Other general symptoms and signs: Principal | ICD-10-CM

## 2014-03-28 DIAGNOSIS — IMO0001 Reserved for inherently not codable concepts without codable children: Secondary | ICD-10-CM

## 2014-03-28 DIAGNOSIS — R569 Unspecified convulsions: Secondary | ICD-10-CM | POA: Diagnosis not present

## 2014-03-28 MED ORDER — LEVETIRACETAM 250 MG PO TABS: ORAL_TABLET | ORAL | Status: DC

## 2014-03-28 NOTE — Progress Notes (Signed)
[facility]  Provider:  Dr Brett Fairy Referring Provider: Neena Rhymes, MD Primary Care Physician:  Cathlean Cower, MD  Chief Complaint  Patient presents with  . Follow-up    Room 10  . Seizures    HPI:  Kayla Kent is a 80 y.o., caucasian , widowed female here as a yearly routine  follow up upon initial referral from Dr. Linda Hedges for seizure versus  syncope.   The patient was originally sent to [facility] in 2013 for an  evaluation of possible seizures . The first spell occurred in March 2013 , while in the [place].  The patient was sitting on a bench in the evening watching relatives play ball and suddenly had " this feeling of being pulled forward",  her vision changed to gray, she could hear people around her -but could not respond. Her daughter witnessed the event and stated that her mother did not fall from the chair , but she threw her hands up for seconds, did not respond verbally.  The patient underwent a glucose check and electrolyte check at the ED,  which were normal. Then she had a second event during a track meet ,watching her grandson run and the same spell happened to her. She recalls a woman, standing near to her, who  was a nurse  And checked her heart beat.   She came quickly back to normal  levels of awareness . She never convulsed. She also overheard when another person in the bleachers announced  she  Will call 911 and she responded verbally : " no".  There was no  Confusion,  impairment or any other symptoms . There were no cardiac events- after  Dr. Linda Hedges evaluated the Patient.   Again,  differential diagnosis included syncope. Yet , Dr. Jacelyn Grip told her that he did not establish a diagnosis , but he started her on Keppra , ( for seizures ) she worked up to 2 tablets in the morning and 2 in p.m. She had no side effects reported at the time . A  MRI of the brain documented  right anterior temporal  encephalomalacia. EEG was non conclusive.   The patient had no neurologic history and on neurologic family history her father has arthritis and migraine,  a brother had coronary artery disease and a fatal heart attack.  Her mother died suddenly but had a history of heavy smoking and to the rectus course of death was not known.   I reviewed the MRI from 02/02/2012 , no acute intracranial abnormality was noted,  left greater than right  intracerebral  encephalomalcia with calcification. MRA :  no significant stenosis of the carotid arteries. Moderate vertebral artery stenosis.   The patient then presented to me in December 2013 and she was on the same doses of Keppra  at the time that I saw her. she is now taking 2 times  a day , a generic form  Of 250 mg tablets : 2 in the morning 2 at night she also is on Crestor- per cardiology - and baby aspirin.  There have been  no further spells. She is aware that discontinuing or weaning her Keppra would mean driving restrictions for 6 month and prefers to continue.   Family history: father committed suicide.   Review of Systems: Out of a complete 14 system review, the patient complains of only the following symptoms, and all other reviewed systems are negative. None. GDS  Zero  points, Allergic eye reaction, itching, hay fever. Rhinitis.    History   Social History  . Marital Status: Widowed    Spouse Name: N/A    Number of Children: 2  . Years of Education: 18   Occupational History  . retired    Social History Main Topics  . Smoking status: Former Smoker    Quit date: 12/08/1955  . Smokeless tobacco: Never Used     Comment: quit in 1995   . Alcohol Use: No  . Drug Use: No  . Sexual Activity: No   Other Topics Concern  . Not on file   Social History Narrative   [employer], [employer]-BA, [employer]-early childhood. Married '55 -23 years.  1 son - 34; 1 daughter - 67; 3 grandchildren . Lives alone. ACP - discussed and provided  packet on end of life care (Feb '13)   Patient is now widowed.   Patient is right-handed.   Patient drinks tea daily.    Family History  Problem Relation Age of Onset  . Coronary artery disease      family hx (also positive for lipids)  . Hypertension      family hx  . Colon cancer Neg Hx   . Breast cancer Neg Hx   . Diabetes Neg Hx   . Mental illness Father     suicide  . Arthritis Father   . Hyperlipidemia Sister   . Hypertension Sister   . Heart disease Brother     CAD/MI  . Hypertension Brother     Past Medical History  Diagnosis Date  . Anxiety   . Paroxysmal supraventricular tachycardia   . MVP (mitral valve prolapse)     hx  . Uterine prolapse without mention of vaginal wall prolapse   . HTN (hypertension)   . HLD (hyperlipidemia)   . Intolerance of drug     orthostatic  . PVD (peripheral vascular disease)   . Allergic rhinitis   . Syncope and collapse     Past Surgical History  Procedure Laterality Date  . Corrective eye surgery      as a child  . Cholecystectomy    . Tonsillectomy    . Ivd removed    . Stress cardiolite  08/05/93  . Rf ablation psvt      summer '10    Current Outpatient Prescriptions  Medication Sig Dispense Refill  . ALPRAZolam (XANAX) 0.25 MG tablet TAKE ONE TABLET BY MOUTH THREE TIMES DAILY  180 tablet  1  . aspirin 81 MG EC tablet Take 81 mg by mouth daily.        Marland Kitchen atenolol (TENORMIN) 50 MG tablet TAKE ONE TABLET BY MOUTH ONCE DAILY  90 tablet  3  . cilostazol (PLETAL) 100 MG tablet Take one-half tablet by mouth in the morning and one tablet in the evening.  135 tablet  3  . ciprofloxacin (CIPRO) 500 MG tablet Take 500 mg by mouth 2 (two) times daily. As needed for dental procedures      . diltiazem (DILACOR XR) 180 MG 24 hr capsule TAKE ONE CAPSULE BY MOUTH EVERY DAY  90 capsule  3  . levETIRAcetam (KEPPRA) 250 MG tablet TAKE TWO TABLETS BY MOUTH TWICE DAILY-THEN BEGIN TITRATE PER TITRATION SCHEDULE-NEEDS TO BE SEEN  120  tablet  0  . rosuvastatin (CRESTOR) 5 MG tablet TAKE ONE TABLET BY MOUTH AT BEDTIME  90 tablet  3  . zolpidem (AMBIEN) 5 MG tablet Take 1 tablet (  5 mg total) by mouth at bedtime as needed.  30 tablet  3   No current facility-administered medications for this visit.    Allergies as of 03/28/2014 - Review Complete 03/28/2014  Allergen Reaction Noted  . Chocolate  01/25/2012  . Codeine    . Diazepam    . Fruit & vegetable daily [nutritional supplements] Hives and Swelling 03/28/2013  . Iodine    . Iohexol  09/10/2008  . Peanut-containing drug products Hives and Swelling 03/28/2013  . Penicillins    . Strawberry  12/08/2011  . Sulfonamide derivatives    . Wheat  10/01/2011    Vitals: BP 129/58  Pulse 65  Resp 15  Ht 5\' 5"  (1.651 m)  Wt 126 lb (57.153 kg)  BMI 20.97 kg/m2 Last Weight:  Wt Readings from Last 1 Encounters:  03/28/14 126 lb (57.153 kg)   Last Height:   Ht Readings from Last 1 Encounters:  03/28/14 5\' 5"  (1.651 m)   Vision Screening:  Left eye with correction 20-25 .  Right eye with correction - 20-300.  Physical exam:  General: The patient is awake, alert and appears not in acute distress. The patient is well groomed. Head: Normocephalic, atraumatic. Neck is supple.  Cardiovascular:  Regular rate and rhythm, without  murmurs or carotid bruit, and without distended neck veins. Respiratory: Lungs are clear to auscultation. Skin:  Without evidence of edema, or rash Trunk:Has normal posture.  Neurologic exam : The patient is awake and alert, oriented to place and time.  Memory subjective  described as intact.  There is a normal attention span & concentration ability. Speech is fluent without dysarthria, dysphonia or aphasia. Mood and affect are appropriate.  Cranial nerves: Pupils are equal and briskly reactive to light - right  Eye amplyopia and esotropia ,present since birth. Extraocular movements therefore disconjugate. Hearing to finger rub intact.   Facial sensation intact to fine touch. Facial motor strength is symmetric and tongue and uvula move midline.  Motor exam:  Normal tone and normal muscle bulk and symmetric normal strength in all extremities.  Sensory:  Fine touch, pinprick and vibration were tested in all extremities.  Proprioception is tested in the upper extremities normal.  Coordination: Rapid alternating movements in the fingers/hands is tested and normal.  Finger-to-nose maneuver tested and normal without evidence of ataxia, dysmetria or tremor.  Gait and station: Patient walks without assistive device. Stance is stable and normal.  Tandem gait is unfragmented.  Deep tendon reflexes: in the  upper and lower extremities are symmetric and intact. Babinski maneuver response is  downgoing.   Assessment:  After physical and neurologic examination, review of laboratory studies, imaging, neurophysiology testing and pre-existing records, assessment : Plan:  Treatment plan and additional workup will be reviewed under Problem List.   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  # 2 no serious side effects or not any side effects at all # 3 discontinuation of the medication would mean driving restrictions. # 4 I recommend not to take wellbutrin or tramadol in light of a possible seizure disorder. The patient has d/c the tramadol already.  The Wellbutrin should be replaced by a SSRI> it was prescribed by Dr.Norins .    Rv refill yearly with NP,Megan Millikan and me .

## 2014-05-10 DIAGNOSIS — H02059 Trichiasis without entropian unspecified eye, unspecified eyelid: Secondary | ICD-10-CM | POA: Diagnosis not present

## 2014-06-10 ENCOUNTER — Ambulatory Visit: Payer: Medicare Other | Admitting: Internal Medicine

## 2014-07-02 ENCOUNTER — Telehealth: Payer: Self-pay | Admitting: *Deleted

## 2014-07-02 MED ORDER — ALPRAZOLAM 0.25 MG PO TABS: ORAL_TABLET | ORAL | Status: DC

## 2014-07-02 NOTE — Telephone Encounter (Signed)
Done hardcopy to robin  

## 2014-07-02 NOTE — Telephone Encounter (Signed)
Notified pt rx sent back to [facility]...Kayla Kent

## 2014-07-02 NOTE — Telephone Encounter (Signed)
Left msg on triage requesting refill on her alprazolam.../lmb

## 2014-07-13 DIAGNOSIS — Z23 Encounter for immunization: Secondary | ICD-10-CM | POA: Diagnosis not present

## 2014-07-18 ENCOUNTER — Other Ambulatory Visit: Payer: Self-pay | Admitting: Internal Medicine

## 2014-07-18 ENCOUNTER — Other Ambulatory Visit: Payer: Self-pay | Admitting: *Deleted

## 2014-07-18 MED ORDER — LEVETIRACETAM 250 MG PO TABS: 250.0000 mg | ORAL_TABLET | Freq: Two times a day (BID) | ORAL | Status: DC

## 2014-07-18 NOTE — Telephone Encounter (Signed)
Left msg on triage stating pt will be out of her meds on Sunday 07/21/14. Requesting refill on her keppra. Sent electronically to [place]...Johny Chess

## 2014-08-28 ENCOUNTER — Encounter (HOSPITAL_COMMUNITY): Payer: Self-pay | Admitting: Emergency Medicine

## 2014-08-28 ENCOUNTER — Inpatient Hospital Stay (HOSPITAL_COMMUNITY)
Admission: EM | Admit: 2014-08-28 | Discharge: 2014-08-31 | DRG: 470 | Disposition: A | Discharge status: Skilled Nursing Facility | Payer: Medicare Other | Attending: Internal Medicine | Admitting: Internal Medicine

## 2014-08-28 ENCOUNTER — Emergency Department (HOSPITAL_COMMUNITY): Payer: Medicare Other

## 2014-08-28 DIAGNOSIS — E785 Hyperlipidemia, unspecified: Secondary | ICD-10-CM | POA: Diagnosis present

## 2014-08-28 DIAGNOSIS — Z91041 Radiographic dye allergy status: Secondary | ICD-10-CM | POA: Diagnosis not present

## 2014-08-28 DIAGNOSIS — W19XXXA Unspecified fall, initial encounter: Secondary | ICD-10-CM | POA: Diagnosis not present

## 2014-08-28 DIAGNOSIS — Z880 Allergy status to penicillin: Secondary | ICD-10-CM | POA: Diagnosis not present

## 2014-08-28 DIAGNOSIS — S72001B Fracture of unspecified part of neck of right femur, initial encounter for open fracture type I or II: Secondary | ICD-10-CM | POA: Diagnosis not present

## 2014-08-28 DIAGNOSIS — S52539A Colles' fracture of unspecified radius, initial encounter for closed fracture: Secondary | ICD-10-CM | POA: Diagnosis not present

## 2014-08-28 DIAGNOSIS — R0989 Other specified symptoms and signs involving the circulatory and respiratory systems: Secondary | ICD-10-CM | POA: Diagnosis not present

## 2014-08-28 DIAGNOSIS — I10 Essential (primary) hypertension: Secondary | ICD-10-CM | POA: Diagnosis present

## 2014-08-28 DIAGNOSIS — Z8249 Family history of ischemic heart disease and other diseases of the circulatory system: Secondary | ICD-10-CM | POA: Diagnosis not present

## 2014-08-28 DIAGNOSIS — Z91040 Latex allergy status: Secondary | ICD-10-CM

## 2014-08-28 DIAGNOSIS — S52611A Displaced fracture of right ulna styloid process, initial encounter for closed fracture: Secondary | ICD-10-CM | POA: Diagnosis present

## 2014-08-28 DIAGNOSIS — Z9109 Other allergy status, other than to drugs and biological substances: Secondary | ICD-10-CM

## 2014-08-28 DIAGNOSIS — S79911A Unspecified injury of right hip, initial encounter: Secondary | ICD-10-CM | POA: Diagnosis not present

## 2014-08-28 DIAGNOSIS — I5032 Chronic diastolic (congestive) heart failure: Secondary | ICD-10-CM | POA: Diagnosis present

## 2014-08-28 DIAGNOSIS — Z7982 Long term (current) use of aspirin: Secondary | ICD-10-CM | POA: Diagnosis not present

## 2014-08-28 DIAGNOSIS — R569 Unspecified convulsions: Secondary | ICD-10-CM

## 2014-08-28 DIAGNOSIS — S52501A Unspecified fracture of the lower end of right radius, initial encounter for closed fracture: Secondary | ICD-10-CM | POA: Diagnosis present

## 2014-08-28 DIAGNOSIS — T148 Other injury of unspecified body region: Secondary | ICD-10-CM | POA: Diagnosis not present

## 2014-08-28 DIAGNOSIS — Z9181 History of falling: Secondary | ICD-10-CM | POA: Diagnosis not present

## 2014-08-28 DIAGNOSIS — I739 Peripheral vascular disease, unspecified: Secondary | ICD-10-CM | POA: Diagnosis present

## 2014-08-28 DIAGNOSIS — S72001A Fracture of unspecified part of neck of right femur, initial encounter for closed fracture: Principal | ICD-10-CM | POA: Diagnosis present

## 2014-08-28 DIAGNOSIS — Z96649 Presence of unspecified artificial hip joint: Secondary | ICD-10-CM

## 2014-08-28 DIAGNOSIS — Z79899 Other long term (current) drug therapy: Secondary | ICD-10-CM

## 2014-08-28 DIAGNOSIS — T148XXA Other injury of unspecified body region, initial encounter: Secondary | ICD-10-CM

## 2014-08-28 DIAGNOSIS — Z885 Allergy status to narcotic agent status: Secondary | ICD-10-CM | POA: Diagnosis not present

## 2014-08-28 DIAGNOSIS — D62 Acute posthemorrhagic anemia: Secondary | ICD-10-CM | POA: Diagnosis not present

## 2014-08-28 DIAGNOSIS — Z87891 Personal history of nicotine dependence: Secondary | ICD-10-CM

## 2014-08-28 DIAGNOSIS — Z882 Allergy status to sulfonamides status: Secondary | ICD-10-CM | POA: Diagnosis not present

## 2014-08-28 DIAGNOSIS — W1809XA Striking against other object with subsequent fall, initial encounter: Secondary | ICD-10-CM | POA: Diagnosis present

## 2014-08-28 DIAGNOSIS — S72009A Fracture of unspecified part of neck of unspecified femur, initial encounter for closed fracture: Secondary | ICD-10-CM | POA: Diagnosis present

## 2014-08-28 DIAGNOSIS — F419 Anxiety disorder, unspecified: Secondary | ICD-10-CM | POA: Diagnosis present

## 2014-08-28 DIAGNOSIS — S52201A Unspecified fracture of shaft of right ulna, initial encounter for closed fracture: Secondary | ICD-10-CM | POA: Diagnosis not present

## 2014-08-28 DIAGNOSIS — M25551 Pain in right hip: Secondary | ICD-10-CM | POA: Diagnosis not present

## 2014-08-28 DIAGNOSIS — Z01818 Encounter for other preprocedural examination: Secondary | ICD-10-CM | POA: Diagnosis not present

## 2014-08-28 DIAGNOSIS — R279 Unspecified lack of coordination: Secondary | ICD-10-CM | POA: Diagnosis not present

## 2014-08-28 DIAGNOSIS — Z471 Aftercare following joint replacement surgery: Secondary | ICD-10-CM | POA: Diagnosis not present

## 2014-08-28 DIAGNOSIS — S52571B Other intraarticular fracture of lower end of right radius, initial encounter for open fracture type I or II: Secondary | ICD-10-CM | POA: Diagnosis not present

## 2014-08-28 DIAGNOSIS — Z91018 Allergy to other foods: Secondary | ICD-10-CM

## 2014-08-28 DIAGNOSIS — Z96641 Presence of right artificial hip joint: Secondary | ICD-10-CM | POA: Diagnosis not present

## 2014-08-28 DIAGNOSIS — R2681 Unsteadiness on feet: Secondary | ICD-10-CM | POA: Diagnosis not present

## 2014-08-28 DIAGNOSIS — S52501D Unspecified fracture of the lower end of right radius, subsequent encounter for closed fracture with routine healing: Secondary | ICD-10-CM | POA: Diagnosis not present

## 2014-08-28 LAB — CBC WITH DIFFERENTIAL/PLATELET
Basophils Absolute: 0 10*3/uL (ref 0.0–0.1)
Basophils Relative: 0 % (ref 0–1)
Eosinophils Absolute: 0 10*3/uL (ref 0.0–0.7)
Eosinophils Relative: 0 % (ref 0–5)
HEMATOCRIT: 37.8 % (ref 36.0–46.0)
Hemoglobin: 12.4 g/dL (ref 12.0–15.0)
LYMPHS ABS: 1.3 10*3/uL (ref 0.7–4.0)
LYMPHS PCT: 9 % — AB (ref 12–46)
MCH: 29 pg (ref 26.0–34.0)
MCHC: 32.8 g/dL (ref 30.0–36.0)
MCV: 88.3 fL (ref 78.0–100.0)
MONO ABS: 0.5 10*3/uL (ref 0.1–1.0)
MONOS PCT: 4 % (ref 3–12)
NEUTROS ABS: 12 10*3/uL — AB (ref 1.7–7.7)
Neutrophils Relative %: 87 % — ABNORMAL HIGH (ref 43–77)
Platelets: 173 10*3/uL (ref 150–400)
RBC: 4.28 MIL/uL (ref 3.87–5.11)
RDW: 13.9 % (ref 11.5–15.5)
WBC: 13.8 10*3/uL — AB (ref 4.0–10.5)

## 2014-08-28 LAB — TYPE AND SCREEN
ABO/RH(D): B POS
ANTIBODY SCREEN: NEGATIVE

## 2014-08-28 LAB — BASIC METABOLIC PANEL
ANION GAP: 14 (ref 5–15)
BUN: 16 mg/dL (ref 6–23)
CO2: 24 meq/L (ref 19–32)
Calcium: 9.1 mg/dL (ref 8.4–10.5)
Chloride: 105 mEq/L (ref 96–112)
Creatinine, Ser: 1 mg/dL (ref 0.50–1.10)
GFR calc Af Amer: 60 mL/min — ABNORMAL LOW (ref >90)
GFR calc non Af Amer: 52 mL/min — ABNORMAL LOW (ref >90)
Glucose, Bld: 127 mg/dL — ABNORMAL HIGH (ref 70–99)
Potassium: 3.4 mEq/L — ABNORMAL LOW (ref 3.7–5.3)
SODIUM: 143 meq/L (ref 137–147)

## 2014-08-28 LAB — PROTIME-INR
INR: 1.09 (ref 0.00–1.49)
Prothrombin Time: 14.2 seconds (ref 11.6–15.2)

## 2014-08-28 LAB — ABO/RH: ABO/RH(D): B POS

## 2014-08-28 MED ORDER — ALPRAZOLAM 0.25 MG PO TABS: 0.2500 mg | ORAL_TABLET | Freq: Three times a day (TID) | ORAL | Status: DC

## 2014-08-28 MED ORDER — CILOSTAZOL 50 MG PO TABS: 50.0000 mg | ORAL_TABLET | Freq: Two times a day (BID) | ORAL | Status: DC

## 2014-08-28 MED ORDER — ALPRAZOLAM 0.25 MG PO TABS
0.2500 mg | ORAL_TABLET | Freq: Three times a day (TID) | ORAL | Status: DC | PRN
  Administered 2014-08-29: 0.25 mg via ORAL
  Filled 2014-08-28: qty 1

## 2014-08-28 MED ORDER — MORPHINE SULFATE 2 MG/ML IJ SOLN
0.5000 mg | INTRAMUSCULAR | Status: DC | PRN
  Administered 2014-08-29: 0.5 mg via INTRAVENOUS
  Filled 2014-08-28: qty 1

## 2014-08-28 MED ORDER — KETOROLAC TROMETHAMINE 30 MG/ML IJ SOLN
30.0000 mg | Freq: Four times a day (QID) | INTRAMUSCULAR | Status: AC | PRN
  Administered 2014-08-28 – 2014-08-29 (×2): 30 mg via INTRAVENOUS
  Filled 2014-08-28 (×2): qty 1

## 2014-08-28 MED ORDER — FENTANYL CITRATE 0.05 MG/ML IJ SOLN
50.0000 ug | Freq: Once | INTRAMUSCULAR | Status: AC
Start: 2014-08-28 — End: 2014-08-28
  Administered 2014-08-28: 50 ug via INTRAVENOUS
  Filled 2014-08-28: qty 2

## 2014-08-28 MED ORDER — HYDROCODONE-ACETAMINOPHEN 5-325 MG PO TABS
1.0000 | ORAL_TABLET | Freq: Four times a day (QID) | ORAL | Status: DC | PRN
  Administered 2014-08-29 – 2014-08-31 (×3): 2 via ORAL
  Filled 2014-08-28 (×3): qty 2

## 2014-08-28 MED ORDER — ATENOLOL 50 MG PO TABS
50.0000 mg | ORAL_TABLET | Freq: Every day | ORAL | Status: DC
  Administered 2014-08-29 – 2014-08-31 (×3): 50 mg via ORAL
  Filled 2014-08-28 (×3): qty 1

## 2014-08-28 MED ORDER — SODIUM CHLORIDE 0.9 % IV SOLN
1000.0000 mL | INTRAVENOUS | Status: DC
  Administered 2014-08-28: 1000 mL via INTRAVENOUS

## 2014-08-28 MED ORDER — DILTIAZEM HCL ER 180 MG PO CP24
180.0000 mg | ORAL_CAPSULE | Freq: Every day | ORAL | Status: DC
  Administered 2014-08-29 – 2014-08-31 (×3): 180 mg via ORAL
  Filled 2014-08-28 (×3): qty 1

## 2014-08-28 MED ORDER — ROSUVASTATIN CALCIUM 5 MG PO TABS
5.0000 mg | ORAL_TABLET | Freq: Every day | ORAL | Status: DC
  Administered 2014-08-30: 5 mg via ORAL
  Filled 2014-08-28 (×4): qty 1

## 2014-08-28 MED ORDER — METHOCARBAMOL 1000 MG/10ML IJ SOLN
500.0000 mg | Freq: Four times a day (QID) | INTRAVENOUS | Status: DC | PRN
  Administered 2014-08-28 – 2014-08-29 (×2): 500 mg via INTRAVENOUS
  Filled 2014-08-28 (×3): qty 5

## 2014-08-28 MED ORDER — LEVETIRACETAM 250 MG PO TABS
250.0000 mg | ORAL_TABLET | Freq: Two times a day (BID) | ORAL | Status: DC
  Administered 2014-08-29 – 2014-08-31 (×4): 250 mg via ORAL
  Filled 2014-08-28 (×7): qty 1

## 2014-08-28 NOTE — ED Notes (Signed)
X-ray at bedside

## 2014-08-28 NOTE — H&P (Signed)
[facility] History and Physical  NIMCO BIVENS GTX:[id number] DOB: 09-08-1932 DOA: 08/28/2014  Referring physician: EDP PCP: Cathlean Cower, MD   Chief Complaint: Fall, hip and wrist pain   HPI: Kayla Kent is a 80 y.o. female quite healthy at baseline, was doing yard work when she suffered a mechanical fall.  No LOC during fall.  Had immediate pain to R wrist and R hip and inability to walk.  Pain worse with movement better when still.  Review of Systems: Can easily climb a flight of stairs without CP or SOB, is very active for age and does yard work including Film/video editor routinely.  On pletal for vascular claudication of her leg with excessive yard work in the past.  Systems reviewed.  As above, otherwise negative  Past Medical History  Diagnosis Date  . Anxiety   . Paroxysmal supraventricular tachycardia   . Uterine prolapse without mention of vaginal wall prolapse   . HTN (hypertension)   . HLD (hyperlipidemia)   . Intolerance of drug     orthostatic  . PVD (peripheral vascular disease)   . Allergic rhinitis   . Syncope and collapse    Past Surgical History  Procedure Laterality Date  . Corrective eye surgery      as a child  . Cholecystectomy    . Tonsillectomy    . Ivd removed    . Stress cardiolite  08/05/93  . Rf ablation psvt      summer '10   Social History:  reports that she quit smoking about 58 years ago. She has never used smokeless tobacco. She reports that she does not drink alcohol or use illicit drugs.  Allergies  Allergen Reactions  . Chocolate   . Codeine   . Diazepam   . Fruit & Vegetable Daily [Nutritional Supplements] Hives and Swelling    peaches  . Iodine   . Iohexol      Code: HIVES, Desc: pt gets 13 hr pre-meds, Onset Date: [id number]   . Latex Hives  . Peach [Prunus Persica]   . Peanut-Containing Drug Products Hives and Swelling  . Penicillins   . Strawberry   . Sulfonamide Derivatives   . Wheat     Shortness of breath     Family History  Problem Relation Age of Onset  . Coronary artery disease      family hx (also positive for lipids)  . Hypertension      family hx  . Colon cancer Neg Hx   . Breast cancer Neg Hx   . Diabetes Neg Hx   . Mental illness Father     suicide  . Arthritis Father   . Hyperlipidemia Sister   . Hypertension Sister   . Heart disease Brother     CAD/MI  . Hypertension Brother      Prior to Admission medications   Medication Sig Start Date End Date Taking? Authorizing Provider  ALPRAZolam (XANAX) 0.25 MG tablet Take 0.25 mg by mouth 3 (three) times daily.   Yes Historical Provider, MD  aspirin 81 MG EC tablet Take 81 mg by mouth daily.     Yes Historical Provider, MD  atenolol (TENORMIN) 50 MG tablet Take 50 mg by mouth daily.   Yes Historical Provider, MD  cilostazol (PLETAL) 100 MG tablet Take one-half tablet by mouth in the morning and one tablet in the evening. 02/20/14  Yes Biagio Borg, MD  diltiazem (DILACOR XR) 180 MG 24 hr capsule Take 180  mg by mouth daily.   Yes Historical Provider, MD  levETIRAcetam (KEPPRA) 250 MG tablet Take 250 mg by mouth 2 (two) times daily. 03/28/14  Yes Asencion Partridge Dohmeier, MD  loperamide (IMODIUM) 2 MG capsule Take 2 mg by mouth daily as needed for diarrhea or loose stools (loose stools).   Yes Historical Provider, MD  rosuvastatin (CRESTOR) 5 MG tablet Take 5 mg by mouth at bedtime.   Yes Historical Provider, MD   Physical Exam: Filed Vitals:   08/28/14 1928  BP: 161/75  Pulse: 92  Temp:   Resp: 20    BP 161/75 mmHg  Pulse 92  Temp(Src) 97.6 F (36.4 C) (Oral)  Resp 20  SpO2 95%  General Appearance:    Alert, oriented, no distress, appears stated age  Head:    Normocephalic, atraumatic  Eyes:    PERRL, EOMI, sclera non-icteric        Nose:   Nares without drainage or epistaxis. Mucosa, turbinates normal  Throat:   Moist mucous membranes. Oropharynx without erythema or exudate.  Neck:   Supple. No carotid bruits.  No  thyromegaly.  No lymphadenopathy.   Back:     No CVA tenderness, no spinal tenderness  Lungs:     Clear to auscultation bilaterally, without wheezes, rhonchi or rales  Chest wall:    No tenderness to palpitation  Heart:    Regular rate and rhythm without murmurs, gallops, rubs  Abdomen:     Soft, non-tender, nondistended, normal bowel sounds, no organomegaly  Genitalia:    deferred  Rectal:    deferred  Extremities:   R hip tenderness and R wrist tenderness.  Pulses:   2+ and symmetric all extremities  Skin:   Skin color, texture, turgor normal, no rashes or lesions  Lymph nodes:   Cervical, supraclavicular, and axillary nodes normal  Neurologic:   CNII-XII intact. Normal strength, sensation and reflexes      throughout    Labs on Admission:  Basic Metabolic Panel:  Recent Labs Lab 08/28/14 2015  NA 143  K 3.4*  CL 105  CO2 24  GLUCOSE 127*  BUN 16  CREATININE 1.00  CALCIUM 9.1   Liver Function Tests: No results for input(s): AST, ALT, ALKPHOS, BILITOT, PROT, ALBUMIN in the last 168 hours. No results for input(s): LIPASE, AMYLASE in the last 168 hours. No results for input(s): AMMONIA in the last 168 hours. CBC:  Recent Labs Lab 08/28/14 2015  WBC 13.8*  NEUTROABS 12.0*  HGB 12.4  HCT 37.8  MCV 88.3  PLT 173   Cardiac Enzymes: No results for input(s): CKTOTAL, CKMB, CKMBINDEX, TROPONINI in the last 168 hours.  BNP (last 3 results) No results for input(s): PROBNP in the last 8760 hours. CBG: No results for input(s): GLUCAP in the last 168 hours.  Radiological Exams on Admission: Dg Wrist Complete Right  08/28/2014   CLINICAL DATA:  Fall while walking this afternoon. Initial evaluation.  EXAM: RIGHT WRIST - COMPLETE 3+ VIEW  COMPARISON:  None.  FINDINGS: Comminuted posterior angulated displaced distal right radial fracture with extension to the radiocarpal joint space noted. Displaced ulnar styloid fracture.  IMPRESSION: Comminuted displaced posteriorly  angulated fracture of the distal radius with extension to the radiocarpal joint space. Displaced ulnar styloid fracture.   Electronically Signed   By: Marcello Moores  Register   On: 08/28/2014 18:48   Dg Hip Complete Right  08/28/2014   CLINICAL DATA:  Fall wall wall and this afternoon. Right lateral hip pain.  Initial encounter.  EXAM: RIGHT HIP - COMPLETE 2+ VIEW  COMPARISON:  None.  FINDINGS: There is a right femoral neck fracture with varus angulation. No subluxation or dislocation. Left hip and SI joints are unremarkable.  IMPRESSION: Right femoral neck fracture with varus angulation.   Electronically Signed   By: Rolm Baptise M.D.   On: 08/28/2014 18:48   Dg Chest Port 1 View  08/28/2014   CLINICAL DATA:  Preop evaluation following hip fracture, hypertension  EXAM: PORTABLE CHEST - 1 VIEW  COMPARISON:  07/16/2009  FINDINGS: Cardiac shadow is stable. The lungs are again hyperinflated. Mild increase in central vascular congestion is noted. Mild scarring is noted in the left lung base. No focal confluent infiltrate is seen.  IMPRESSION: Mild vascular congestion.  No other acute abnormality is noted   Electronically Signed   By: Inez Catalina M.D.   On: 08/28/2014 19:21   Dg Femur Right Port  08/28/2014   CLINICAL DATA:  Fall with right hip pain.  EXAM: PORTABLE RIGHT FEMUR - 2 VIEW  COMPARISON:  Right hip same day.  FINDINGS: Images of the right femur excluding the right hip demonstrate no additional fracture sites. Remainder the exam is unremarkable.  IMPRESSION: No acute findings.   Electronically Signed   By: Marin Olp M.D.   On: 08/28/2014 20:37    EKG: Independently reviewed. Ordered and pending.  Assessment/Plan Principal Problem:   Fracture of femoral neck, right Active Problems:   HLD (hyperlipidemia)   Essential hypertension   Chronic diastolic CHF (congestive heart failure)   Seizures   Distal radius fracture, right   Hip fracture requiring operative repair   1. R femoral neck  fracture and R radius fracture 1. Admit to [facility] 2. Surgery tomorrow for both 3. Holding ASA and pletal as per ortho note 4. SCDs only for DVT ppx, advance to blood thinners as soon as reasonably appropriate after surgery. 5. EKG pending, suspect patient will end up being low risk based on history however.  She routinely completes very high MET requiring activities including yard work without any symptoms of SOB or chest pain. 6. NPO after 8 AM 2. HLD - continue statin 3. HTN - continue home meds 4. Seizures - continue keppra    Code Status: Full Code  Family Communication: Daughter at bedside Disposition Plan: Admit to inpatient   Time spent: 37 min  GARDNER, JARED M. [facility] Pager [phone]  If 7AM-7PM, please contact the day team taking care of the patient [email or web address] Password [email or web address] 08/28/2014, 10:01 PM

## 2014-08-28 NOTE — ED Notes (Signed)
Pt BIB EMS, reports that she fell, tripping over a bucket, landing beside her stairs. Denies LOC, hitting her head, head or neck pain. Pt initially had shortening and inner rotation of RLE, but placed in KED and no longer has deformity. Bilateral pedal pulsed +2, sensation intact. Pt noted to have swelling to R wrist, pain 3/10. Bilateral radial pulses +2, sensation intact. Able to move x4 extremities with full ROM. Pt remains a&ox4, skin warm and dry,  Bruising noted to R wrist and R hip.

## 2014-08-28 NOTE — ED Notes (Signed)
Bed: PJ09 Expected date: 08/28/14 Expected time:  Means of arrival:  Comments: EMS- fall, leg pain

## 2014-08-28 NOTE — Consult Note (Signed)
ORTHOPAEDIC CONSULTATION  REQUESTING PHYSICIAN: Jasper Riling. Alvino Chapel, MD  Chief Complaint: right hip fx, right wrist fx  HPI: Kayla Kent is a 80 y.o. female who complains of right hip and wrist fx s/p mechanical fall earlier today. She's fully independent at home and walks without assistive devices.  Denies previous hip or groin pain.  On pletal for vascular claudication.  Lives alone at home.  Denies LOC, neck pain, abd pain.  Ortho consulted.  Past Medical History  Diagnosis Date  . Anxiety   . Paroxysmal supraventricular tachycardia   . MVP (mitral valve prolapse)     hx  . Uterine prolapse without mention of vaginal wall prolapse   . HTN (hypertension)   . HLD (hyperlipidemia)   . Intolerance of drug     orthostatic  . PVD (peripheral vascular disease)   . Allergic rhinitis   . Syncope and collapse    Past Surgical History  Procedure Laterality Date  . Corrective eye surgery      as a child  . Cholecystectomy    . Tonsillectomy    . Ivd removed    . Stress cardiolite  08/05/93  . Rf ablation psvt      summer '10   History   Social History  . Marital Status: Widowed    Spouse Name: N/A    Number of Children: 2  . Years of Education: 18   Occupational History  . retired    Social History Main Topics  . Smoking status: Former Smoker    Quit date: 12/08/1955  . Smokeless tobacco: Never Used     Comment: quit in 1995   . Alcohol Use: No  . Drug Use: No  . Sexual Activity: No   Other Topics Concern  . None   Social History Narrative   [employer], [employer], [employer]-early childhood. Married '55 -41 years.  1 son - 46; 1 daughter - 14; 3 grandchildren . Lives alone. ACP - discussed and provided packet on end of life care (Feb '13)   Patient is now widowed.   Patient is right-handed.   Patient drinks tea daily.   Family History  Problem Relation Age of Onset  . Coronary artery disease      family hx (also positive for lipids)  .  Hypertension      family hx  . Colon cancer Neg Hx   . Breast cancer Neg Hx   . Diabetes Neg Hx   . Mental illness Father     suicide  . Arthritis Father   . Hyperlipidemia Sister   . Hypertension Sister   . Heart disease Brother     CAD/MI  . Hypertension Brother    Allergies  Allergen Reactions  . Chocolate   . Codeine   . Diazepam   . Fruit & Vegetable Daily [Nutritional Supplements] Hives and Swelling    peaches  . Iodine   . Iohexol      Code: HIVES, Desc: pt gets 13 hr pre-meds, Onset Date: [id number]   . Latex Hives  . Peanut-Containing Drug Products Hives and Swelling  . Penicillins   . Strawberry   . Sulfonamide Derivatives   . Wheat     Shortness of breath   Prior to Admission medications   Medication Sig Start Date End Date Taking? Authorizing Provider  ALPRAZolam (XANAX) 0.25 MG tablet Take 0.25 mg by mouth 3 (three) times daily.   Yes Historical Provider, MD  aspirin 81 MG  EC tablet Take 81 mg by mouth daily.     Yes Historical Provider, MD  atenolol (TENORMIN) 50 MG tablet Take 50 mg by mouth daily.   Yes Historical Provider, MD  cilostazol (PLETAL) 100 MG tablet Take one-half tablet by mouth in the morning and one tablet in the evening. 02/20/14  Yes Biagio Borg, MD  diltiazem (DILACOR XR) 180 MG 24 hr capsule Take 180 mg by mouth daily.   Yes Historical Provider, MD  levETIRAcetam (KEPPRA) 250 MG tablet Take 250 mg by mouth 2 (two) times daily. 03/28/14  Yes Asencion Partridge Dohmeier, MD  loperamide (IMODIUM) 2 MG capsule Take 2 mg by mouth daily as needed for diarrhea or loose stools (loose stools).   Yes Historical Provider, MD  rosuvastatin (CRESTOR) 5 MG tablet Take 5 mg by mouth at bedtime.   Yes Historical Provider, MD  ALPRAZolam Duanne Moron) 0.25 MG tablet TAKE ONE TABLET BY MOUTH THREE TIMES DAILY 07/02/14   Biagio Borg, MD  atenolol (TENORMIN) 50 MG tablet TAKE ONE TABLET BY MOUTH ONCE DAILY 09/26/13   Neena Rhymes, MD  diltiazem (DILACOR XR) 180 MG 24 hr  capsule TAKE ONE CAPSULE BY MOUTH EVERY DAY 02/08/14   Biagio Borg, MD  levETIRAcetam (KEPPRA) 250 MG tablet Take 1 tablet (250 mg total) by mouth 2 (two) times daily. 07/18/14   Biagio Borg, MD  rosuvastatin (CRESTOR) 5 MG tablet TAKE ONE TABLET BY MOUTH AT BEDTIME 02/08/14   Biagio Borg, MD  zolpidem (AMBIEN) 5 MG tablet Take 1 tablet (5 mg total) by mouth at bedtime as needed. 12/21/13   Neena Rhymes, MD   Dg Wrist Complete Right  08/28/2014   CLINICAL DATA:  Fall while walking this afternoon. Initial evaluation.  EXAM: RIGHT WRIST - COMPLETE 3+ VIEW  COMPARISON:  None.  FINDINGS: Comminuted posterior angulated displaced distal right radial fracture with extension to the radiocarpal joint space noted. Displaced ulnar styloid fracture.  IMPRESSION: Comminuted displaced posteriorly angulated fracture of the distal radius with extension to the radiocarpal joint space. Displaced ulnar styloid fracture.   Electronically Signed   By: Marcello Moores  Register   On: 08/28/2014 18:48   Dg Hip Complete Right  08/28/2014   CLINICAL DATA:  Fall wall wall and this afternoon. Right lateral hip pain. Initial encounter.  EXAM: RIGHT HIP - COMPLETE 2+ VIEW  COMPARISON:  None.  FINDINGS: There is a right femoral neck fracture with varus angulation. No subluxation or dislocation. Left hip and SI joints are unremarkable.  IMPRESSION: Right femoral neck fracture with varus angulation.   Electronically Signed   By: Rolm Baptise M.D.   On: 08/28/2014 18:48   Dg Chest Port 1 View  08/28/2014   CLINICAL DATA:  Preop evaluation following hip fracture, hypertension  EXAM: PORTABLE CHEST - 1 VIEW  COMPARISON:  07/16/2009  FINDINGS: Cardiac shadow is stable. The lungs are again hyperinflated. Mild increase in central vascular congestion is noted. Mild scarring is noted in the left lung base. No focal confluent infiltrate is seen.  IMPRESSION: Mild vascular congestion.  No other acute abnormality is noted   Electronically Signed    By: Inez Catalina M.D.   On: 08/28/2014 19:21   Dg Femur Right Port  08/28/2014   CLINICAL DATA:  Fall with right hip pain.  EXAM: PORTABLE RIGHT FEMUR - 2 VIEW  COMPARISON:  Right hip same day.  FINDINGS: Images of the right femur excluding the right hip demonstrate no  additional fracture sites. Remainder the exam is unremarkable.  IMPRESSION: No acute findings.   Electronically Signed   By: Marin Olp M.D.   On: 08/28/2014 20:37    Positive ROS: All other systems have been reviewed and were otherwise negative with the exception of those mentioned in the HPI and as above.  Physical Exam: General: Alert, no acute distress Cardiovascular: No pedal edema Respiratory: No cyanosis, no use of accessory musculature GI: No organomegaly, abdomen is soft and non-tender Skin: No lesions in the area of chief complaint Neurologic: Sensation intact distally Psychiatric: Patient is competent for consent with normal mood and affect Lymphatic: No axillary or cervical lymphadenopathy  MUSCULOSKELETAL:  - 1+ distal pulses - +ankle DF/PF - foot wwp  - hand wwp - CR < 2s - well fitting splint - no signs of acute CTS  Assessment: 1. Right femoral neck fx 2. Right distal radius fx  Plan: - discussed findings with patient, recommend surgical treatment of both - discussed r/b/a, they wish to proceed - consent signed - NPO after 8 am tomorrow.  Surgery planned for 4:45 pm tomorrow - awaiting medical clearance - appreciate transfer to [facility] - stop pletal and asa  - Based on history and fracture pattern this likely represents a fragility fracture. - Fragility fractures affect up to one half of women and one third of men after age 33 years and occur in the setting of bone disorder such as osteoporosis or osteopenia and warrant appropriate work-up. - The following are general recommendations that may serve as an outline for an appropriate work-up:  1.) Obtain bone density measurement to confirm  presumptive diagnosis, assess severity of osteoporosis and risk of future fracture, and use as baseline for monitoring treatment  2.) Obtain laboratory tests: CBC, ESR, serum calcium, creatinine, albumin,phosphate, alkaline phosphatase, liver transaminases, protein electrophoresis, urinalysis, 25-hydroxyvitamin D.  3.) Exclude secondary causes of low bone mass and skeletal fragility (eg,multiple myeloma, lymphoma) as indicated.  4.) Obtain radiograph of thoracic and lumbar spine, particularly among individuals with back pain or height loss to assess presence of vertebral fractures  5.) Intermittent administration of recombinant human parathyroid hormone  6.) Optimize nutritional status using nutritional supplementation.  7.) Patient/family education to prevent future falls.  8.) Early mobilization and exercise program - exercise decreases the rate of bone loss and has been associated with decreased rate of fragility fractures   Thank you for the consult and the opportunity to see Ms. Janetta Hora Eduard Roux, MD [place] 9:00 PM

## 2014-08-28 NOTE — ED Provider Notes (Signed)
CSN: [id number]     Arrival date & time 08/28/14  1646 History   First MD Initiated Contact with Patient 08/28/14 1726     Chief Complaint  Patient presents with  . Fall     (Consider location/radiation/quality/duration/timing/severity/associated sxs/prior Treatment) Patient is a 80 y.o. female presenting with fall. The history is provided by the patient.  Fall This is a new problem. Pertinent negatives include no chest pain, no abdominal pain, no headaches and no shortness of breath.  patient tripped on some leaves and landed on her right hip and right wrist. Leg was reportedly initially internally rotated and patient is having difficulty moving it. When she was immobilized and went back to the right direction. Still complaining of pain patient feels it would hurt to move. Also deformity to right wrist. She states she did not hit her head. No numbness or weakness. She is on Pletal for a previous vascular injury to her left leg.  Past Medical History  Diagnosis Date  . Anxiety   . Paroxysmal supraventricular tachycardia   . Uterine prolapse without mention of vaginal wall prolapse   . HTN (hypertension)   . HLD (hyperlipidemia)   . Intolerance of drug     orthostatic  . PVD (peripheral vascular disease)   . Allergic rhinitis   . Syncope and collapse    Past Surgical History  Procedure Laterality Date  . Corrective eye surgery      as a child  . Cholecystectomy    . Tonsillectomy    . Ivd removed    . Stress cardiolite  08/05/93  . Rf ablation psvt      summer '10   Family History  Problem Relation Age of Onset  . Coronary artery disease      family hx (also positive for lipids)  . Hypertension      family hx  . Colon cancer Neg Hx   . Breast cancer Neg Hx   . Diabetes Neg Hx   . Mental illness Father     suicide  . Arthritis Father   . Hyperlipidemia Sister   . Hypertension Sister   . Heart disease Brother     CAD/MI  . Hypertension Brother    History   Substance Use Topics  . Smoking status: Former Smoker    Quit date: 12/08/1955  . Smokeless tobacco: Never Used     Comment: quit in 1995   . Alcohol Use: No   OB History    No data available     Review of Systems  Constitutional: Negative for activity change and appetite change.  Eyes: Negative for pain.  Respiratory: Negative for chest tightness and shortness of breath.   Cardiovascular: Negative for chest pain and leg swelling.  Gastrointestinal: Negative for nausea, vomiting, abdominal pain and diarrhea.  Genitourinary: Negative for flank pain.  Musculoskeletal: Positive for joint swelling. Negative for back pain, neck pain and neck stiffness.  Skin: Negative for rash and wound.  Neurological: Negative for weakness, numbness and headaches.  Psychiatric/Behavioral: Negative for behavioral problems.      Allergies  Chocolate; Codeine; Diazepam; Fruit & vegetable daily; Iodine; Iohexol; Latex; Peach; Peanut-containing drug products; Penicillins; Strawberry; Sulfonamide derivatives; and Wheat  Home Medications   Prior to Admission medications   Medication Sig Start Date End Date Taking? Authorizing Provider  ALPRAZolam (XANAX) 0.25 MG tablet Take 0.25 mg by mouth 3 (three) times daily.   Yes Historical Provider, MD  aspirin 81 MG EC tablet Take  81 mg by mouth daily.     Yes Historical Provider, MD  atenolol (TENORMIN) 50 MG tablet Take 50 mg by mouth daily.   Yes Historical Provider, MD  cilostazol (PLETAL) 100 MG tablet Take one-half tablet by mouth in the morning and one tablet in the evening. 02/20/14  Yes Biagio Borg, MD  diltiazem (DILACOR XR) 180 MG 24 hr capsule Take 180 mg by mouth daily.   Yes Historical Provider, MD  levETIRAcetam (KEPPRA) 250 MG tablet Take 250 mg by mouth 2 (two) times daily. 03/28/14  Yes Asencion Partridge Dohmeier, MD  loperamide (IMODIUM) 2 MG capsule Take 2 mg by mouth daily as needed for diarrhea or loose stools (loose stools).   Yes Historical Provider,  MD  rosuvastatin (CRESTOR) 5 MG tablet Take 5 mg by mouth at bedtime.   Yes Historical Provider, MD   BP 179/75 mmHg  Pulse 107  Temp(Src) 98.1 F (36.7 C) (Oral)  Resp 16  SpO2 94% Physical Exam  Constitutional: She appears well-developed and well-nourished.  HENT:  Head: Normocephalic.  Cardiovascular: Normal rate and regular rhythm.   Pulmonary/Chest: Effort normal.  Abdominal: There is no tenderness.  Musculoskeletal: She exhibits tenderness.  Deformity toright wrist with dorsal angulation. Strong radial pulse. Sensation intact over radial median and ulnar distribution of the right hand. Some tenderness over right hip medially. Decreased range of motion. No tenderness over knee or foot. Sensation grossly intact over foot.  Neurological: She is alert.  Skin: Skin is warm.    ED Course  Procedures (including critical care time) Labs Review Labs Reviewed  BASIC METABOLIC PANEL - Abnormal; Notable for the following:    Potassium 3.4 (*)    Glucose, Bld 127 (*)    GFR calc non Af Amer 52 (*)    GFR calc Af Amer 60 (*)    All other components within normal limits  CBC WITH DIFFERENTIAL - Abnormal; Notable for the following:    WBC 13.8 (*)    Neutrophils Relative % 87 (*)    Neutro Abs 12.0 (*)    Lymphocytes Relative 9 (*)    All other components within normal limits  PROTIME-INR  TYPE AND SCREEN  ABO/RH    Imaging Review Dg Wrist Complete Right  08/28/2014   CLINICAL DATA:  Fall while walking this afternoon. Initial evaluation.  EXAM: RIGHT WRIST - COMPLETE 3+ VIEW  COMPARISON:  None.  FINDINGS: Comminuted posterior angulated displaced distal right radial fracture with extension to the radiocarpal joint space noted. Displaced ulnar styloid fracture.  IMPRESSION: Comminuted displaced posteriorly angulated fracture of the distal radius with extension to the radiocarpal joint space. Displaced ulnar styloid fracture.   Electronically Signed   By: Marcello Moores  Register   On:  08/28/2014 18:48   Dg Hip Complete Right  08/28/2014   CLINICAL DATA:  Fall wall wall and this afternoon. Right lateral hip pain. Initial encounter.  EXAM: RIGHT HIP - COMPLETE 2+ VIEW  COMPARISON:  None.  FINDINGS: There is a right femoral neck fracture with varus angulation. No subluxation or dislocation. Left hip and SI joints are unremarkable.  IMPRESSION: Right femoral neck fracture with varus angulation.   Electronically Signed   By: Rolm Baptise M.D.   On: 08/28/2014 18:48   Dg Chest Port 1 View  08/28/2014   CLINICAL DATA:  Preop evaluation following hip fracture, hypertension  EXAM: PORTABLE CHEST - 1 VIEW  COMPARISON:  07/16/2009  FINDINGS: Cardiac shadow is stable. The lungs are again  hyperinflated. Mild increase in central vascular congestion is noted. Mild scarring is noted in the left lung base. No focal confluent infiltrate is seen.  IMPRESSION: Mild vascular congestion.  No other acute abnormality is noted   Electronically Signed   By: Inez Catalina M.D.   On: 08/28/2014 19:21   Dg Femur Right Port  08/28/2014   CLINICAL DATA:  Fall with right hip pain.  EXAM: PORTABLE RIGHT FEMUR - 2 VIEW  COMPARISON:  Right hip same day.  FINDINGS: Images of the right femur excluding the right hip demonstrate no additional fracture sites. Remainder the exam is unremarkable.  IMPRESSION: No acute findings.   Electronically Signed   By: Marin Olp M.D.   On: 08/28/2014 20:37     EKG Interpretation None      MDM   Final diagnoses:  Fall  Hip fracture requiring operative repair, right, closed, initial encounter  Distal radius fracture, right, closed, initial encounter  Ulna styloid fracture, closed, right, initial encounter    Patient with fall. Right hip and right wrist fracture. No other apparent injury. Discussed with Dr. Erlinda Hong, will likely operate tomorrow a [place]. Will admit to [place]. Alvino Chapel, MD 08/28/14 [phone]

## 2014-08-28 NOTE — Progress Notes (Signed)
  CARE MANAGEMENT ED NOTE 08/28/2014  Patient:  Kayla Kent, Kayla Kent   Account Number:  [id number]  Date Initiated:  08/28/2014  Documentation initiated by:  Livia Snellen  Subjective/Objective Assessment:   Patient presents to Ed post fall with injury to her right hip.     Subjective/Objective Assessment Detail:   Xray right hip: Right femoral neck fracture with varus angulation.     Action/Plan:   Orthopedic surgery consult   Action/Plan Detail:   Anticipated DC Date:       Status Recommendation to Physician:   Result of Recommendation:    Other ED Services  Consult Working [place]  Other    Choice offered to / List presented to:            Status of service:  Completed, signed off  ED Comments:   ED Comments Detail:  EDCM spoke to patient and her daughter at bedside.  Patient reports she lives at home alone.  Patient does not have any medical equipment at home.  Patient does not have any home health services at home.  Patient has never had home health services before.  EDCM explained to patient that with broken hip, patient may require rehab.  SW consult placed. EDCM also provided patient with list of home health agencies in [facility]  EDCM explained with home health the patient may receive a visiting RN, PT. OT, aide and social worker if needed.  EDCM explained that home health agency has 24-48 hours to contact them for services. Patient and patient's daughter thankful for services.  No further EDCM needs at this time.

## 2014-08-29 ENCOUNTER — Inpatient Hospital Stay (HOSPITAL_COMMUNITY): Payer: Medicare Other

## 2014-08-29 ENCOUNTER — Inpatient Hospital Stay (HOSPITAL_COMMUNITY): Payer: Medicare Other | Admitting: Anesthesiology

## 2014-08-29 ENCOUNTER — Encounter (HOSPITAL_COMMUNITY)
Admission: EM | Disposition: A | Discharge status: Skilled Nursing Facility | Payer: Medicare Other | Source: Home / Self Care | Attending: Internal Medicine

## 2014-08-29 ENCOUNTER — Encounter (HOSPITAL_COMMUNITY): Payer: Self-pay | Admitting: Certified Registered Nurse Anesthetist

## 2014-08-29 DIAGNOSIS — I5032 Chronic diastolic (congestive) heart failure: Secondary | ICD-10-CM

## 2014-08-29 HISTORY — PX: OPEN REDUCTION INTERNAL FIXATION (ORIF) DISTAL RADIAL FRACTURE: SHX5989

## 2014-08-29 HISTORY — PX: TOTAL HIP ARTHROPLASTY: SHX124

## 2014-08-29 LAB — SURGICAL PCR SCREEN
MRSA, PCR: NEGATIVE
STAPHYLOCOCCUS AUREUS: POSITIVE — AB

## 2014-08-29 SURGERY — ARTHROPLASTY, HIP, TOTAL, ANTERIOR APPROACH
Anesthesia: General | Site: Wrist | Laterality: Right

## 2014-08-29 MED ORDER — SORBITOL 70 % SOLN: 30.0000 mL | Freq: Every day | Status: DC | PRN

## 2014-08-29 MED ORDER — OXYCODONE HCL 5 MG PO TABS: 5.0000 mg | ORAL_TABLET | Freq: Once | ORAL | Status: DC | PRN

## 2014-08-29 MED ORDER — PHENYLEPHRINE HCL 10 MG/ML IJ SOLN
INTRAMUSCULAR | Status: DC | PRN
  Administered 2014-08-29 (×2): 200 ug via INTRAVENOUS

## 2014-08-29 MED ORDER — ENOXAPARIN SODIUM 40 MG/0.4ML ~~LOC~~ SOLN: 40.0000 mg | Freq: Every day | SUBCUTANEOUS | Status: DC

## 2014-08-29 MED ORDER — LACTATED RINGERS IV SOLN
INTRAVENOUS | Status: DC
  Administered 2014-08-29 (×2): via INTRAVENOUS

## 2014-08-29 MED ORDER — POLYETHYLENE GLYCOL 3350 17 G PO PACK: 17.0000 g | PACK | Freq: Every day | ORAL | Status: DC | PRN

## 2014-08-29 MED ORDER — MENTHOL 3 MG MT LOZG: 1.0000 | LOZENGE | OROMUCOSAL | Status: DC | PRN

## 2014-08-29 MED ORDER — FENTANYL CITRATE 0.05 MG/ML IJ SOLN
INTRAMUSCULAR | Status: AC
  Filled 2014-08-29: qty 5

## 2014-08-29 MED ORDER — ACETAMINOPHEN 325 MG PO TABS: 650.0000 mg | ORAL_TABLET | Freq: Four times a day (QID) | ORAL | Status: DC | PRN

## 2014-08-29 MED ORDER — ACETAMINOPHEN 500 MG PO TABS: 500.0000 mg | ORAL_TABLET | Freq: Four times a day (QID) | ORAL | Status: DC | PRN

## 2014-08-29 MED ORDER — ARTIFICIAL TEARS OP OINT
TOPICAL_OINTMENT | OPHTHALMIC | Status: AC
  Filled 2014-08-29: qty 3.5

## 2014-08-29 MED ORDER — PROPOFOL 10 MG/ML IV BOLUS
INTRAVENOUS | Status: DC | PRN
  Administered 2014-08-29: 80 mg via INTRAVENOUS

## 2014-08-29 MED ORDER — HYDROMORPHONE HCL 1 MG/ML IJ SOLN
INTRAMUSCULAR | Status: AC
  Filled 2014-08-29: qty 1

## 2014-08-29 MED ORDER — ONDANSETRON HCL 4 MG/2ML IJ SOLN
4.0000 mg | Freq: Once | INTRAMUSCULAR | Status: DC | PRN
Start: 2014-08-29 — End: 2014-08-29

## 2014-08-29 MED ORDER — NEOSTIGMINE METHYLSULFATE 10 MG/10ML IV SOLN
INTRAVENOUS | Status: AC
  Filled 2014-08-29: qty 1

## 2014-08-29 MED ORDER — BUPIVACAINE HCL (PF) 0.25 % IJ SOLN
INTRAMUSCULAR | Status: AC
  Filled 2014-08-29: qty 30

## 2014-08-29 MED ORDER — METHOCARBAMOL 1000 MG/10ML IJ SOLN
500.0000 mg | Freq: Four times a day (QID) | INTRAVENOUS | Status: DC | PRN
  Filled 2014-08-29: qty 5

## 2014-08-29 MED ORDER — MAGNESIUM CITRATE PO SOLN
1.0000 | Freq: Once | ORAL | Status: AC | PRN
Start: 2014-08-29 — End: 2014-08-29

## 2014-08-29 MED ORDER — VANCOMYCIN HCL IN DEXTROSE 1-5 GM/200ML-% IV SOLN
1000.0000 mg | Freq: Once | INTRAVENOUS | Status: AC
  Administered 2014-08-30: 1000 mg via INTRAVENOUS
  Filled 2014-08-29: qty 200

## 2014-08-29 MED ORDER — DEXAMETHASONE SODIUM PHOSPHATE 4 MG/ML IJ SOLN
INTRAMUSCULAR | Status: DC | PRN
  Administered 2014-08-29: 4 mg via INTRAVENOUS

## 2014-08-29 MED ORDER — FENTANYL CITRATE 0.05 MG/ML IJ SOLN
INTRAMUSCULAR | Status: DC | PRN
  Administered 2014-08-29: 120 ug via INTRAVENOUS
  Administered 2014-08-29: 30 ug via INTRAVENOUS

## 2014-08-29 MED ORDER — ONDANSETRON HCL 4 MG/2ML IJ SOLN
INTRAMUSCULAR | Status: DC | PRN
  Administered 2014-08-29: 4 mg via INTRAVENOUS

## 2014-08-29 MED ORDER — CHLORHEXIDINE GLUCONATE 4 % EX LIQD
60.0000 mL | Freq: Once | CUTANEOUS | Status: AC
  Administered 2014-08-29: 4 via TOPICAL
  Filled 2014-08-29: qty 60

## 2014-08-29 MED ORDER — METHOCARBAMOL 500 MG PO TABS: 500.0000 mg | ORAL_TABLET | Freq: Four times a day (QID) | ORAL | Status: DC | PRN

## 2014-08-29 MED ORDER — KETOROLAC TROMETHAMINE 30 MG/ML IJ SOLN: 30.0000 mg | Freq: Four times a day (QID) | INTRAMUSCULAR | Status: DC | PRN

## 2014-08-29 MED ORDER — METOCLOPRAMIDE HCL 5 MG/ML IJ SOLN: 5.0000 mg | Freq: Three times a day (TID) | INTRAMUSCULAR | Status: DC | PRN

## 2014-08-29 MED ORDER — PHENOL 1.4 % MT LIQD: 1.0000 | OROMUCOSAL | Status: DC | PRN

## 2014-08-29 MED ORDER — SODIUM CHLORIDE 0.9 % IV SOLN
INTRAVENOUS | Status: DC
  Administered 2014-08-29: 01:00:00 via INTRAVENOUS

## 2014-08-29 MED ORDER — ROCURONIUM BROMIDE 50 MG/5ML IV SOLN
INTRAVENOUS | Status: AC
  Filled 2014-08-29: qty 1

## 2014-08-29 MED ORDER — SODIUM CHLORIDE 0.9 % IJ SOLN
INTRAMUSCULAR | Status: AC
  Filled 2014-08-29: qty 10

## 2014-08-29 MED ORDER — GLYCOPYRROLATE 0.2 MG/ML IJ SOLN
INTRAMUSCULAR | Status: AC
  Filled 2014-08-29: qty 2

## 2014-08-29 MED ORDER — DEXTROSE 5 % IV SOLN
10.0000 mg | INTRAVENOUS | Status: DC | PRN
  Administered 2014-08-29: 50 ug/min via INTRAVENOUS

## 2014-08-29 MED ORDER — ENOXAPARIN SODIUM 40 MG/0.4ML ~~LOC~~ SOLN
40.0000 mg | SUBCUTANEOUS | Status: DC
  Administered 2014-08-30 – 2014-08-31 (×2): 40 mg via SUBCUTANEOUS
  Filled 2014-08-29 (×3): qty 0.4

## 2014-08-29 MED ORDER — ROCURONIUM BROMIDE 100 MG/10ML IV SOLN
INTRAVENOUS | Status: DC | PRN
  Administered 2014-08-29 (×3): 10 mg via INTRAVENOUS
  Administered 2014-08-29: 50 mg via INTRAVENOUS

## 2014-08-29 MED ORDER — CHLORHEXIDINE GLUCONATE CLOTH 2 % EX PADS
6.0000 | MEDICATED_PAD | Freq: Every day | CUTANEOUS | Status: DC
  Administered 2014-08-29 – 2014-08-31 (×3): 6 via TOPICAL

## 2014-08-29 MED ORDER — ONDANSETRON HCL 4 MG/2ML IJ SOLN: 4.0000 mg | Freq: Four times a day (QID) | INTRAMUSCULAR | Status: DC | PRN

## 2014-08-29 MED ORDER — LIDOCAINE HCL (CARDIAC) 20 MG/ML IV SOLN
INTRAVENOUS | Status: DC | PRN
  Administered 2014-08-29: 100 mg via INTRAVENOUS

## 2014-08-29 MED ORDER — EPHEDRINE SULFATE 50 MG/ML IJ SOLN
INTRAMUSCULAR | Status: AC
  Filled 2014-08-29: qty 1

## 2014-08-29 MED ORDER — POTASSIUM CHLORIDE CRYS ER 20 MEQ PO TBCR
40.0000 meq | EXTENDED_RELEASE_TABLET | Freq: Once | ORAL | Status: AC
  Administered 2014-08-29: 40 meq via ORAL
  Filled 2014-08-29: qty 2

## 2014-08-29 MED ORDER — SODIUM CHLORIDE 0.9 % IV SOLN
INTRAVENOUS | Status: DC
  Administered 2014-08-30: 125 mL/h via INTRAVENOUS

## 2014-08-29 MED ORDER — DEXAMETHASONE SODIUM PHOSPHATE 4 MG/ML IJ SOLN
INTRAMUSCULAR | Status: AC
  Filled 2014-08-29: qty 1

## 2014-08-29 MED ORDER — ARTIFICIAL TEARS OP OINT
TOPICAL_OINTMENT | OPHTHALMIC | Status: DC | PRN
  Administered 2014-08-29: 1 via OPHTHALMIC

## 2014-08-29 MED ORDER — ALUM & MAG HYDROXIDE-SIMETH 200-200-20 MG/5ML PO SUSP: 30.0000 mL | ORAL | Status: DC | PRN

## 2014-08-29 MED ORDER — OXYCODONE HCL 5 MG/5ML PO SOLN: 5.0000 mg | Freq: Once | ORAL | Status: DC | PRN

## 2014-08-29 MED ORDER — ONDANSETRON HCL 4 MG PO TABS: 4.0000 mg | ORAL_TABLET | Freq: Four times a day (QID) | ORAL | Status: DC | PRN

## 2014-08-29 MED ORDER — CLINDAMYCIN PHOSPHATE 900 MG/50ML IV SOLN
900.0000 mg | INTRAVENOUS | Status: AC
  Administered 2014-08-29: 900 mg via INTRAVENOUS
  Filled 2014-08-29: qty 50

## 2014-08-29 MED ORDER — 0.9 % SODIUM CHLORIDE (POUR BTL) OPTIME
TOPICAL | Status: DC | PRN
  Administered 2014-08-29: 1000 mL

## 2014-08-29 MED ORDER — ONDANSETRON HCL 4 MG/2ML IJ SOLN
INTRAMUSCULAR | Status: AC
  Filled 2014-08-29: qty 2

## 2014-08-29 MED ORDER — SENNA 8.6 MG PO TABS
1.0000 | ORAL_TABLET | Freq: Two times a day (BID) | ORAL | Status: DC
  Administered 2014-08-30 – 2014-08-31 (×4): 8.6 mg via ORAL
  Filled 2014-08-29 (×5): qty 1

## 2014-08-29 MED ORDER — ACETAMINOPHEN 650 MG RE SUPP: 650.0000 mg | Freq: Four times a day (QID) | RECTAL | Status: DC | PRN

## 2014-08-29 MED ORDER — METOCLOPRAMIDE HCL 5 MG PO TABS
5.0000 mg | ORAL_TABLET | Freq: Three times a day (TID) | ORAL | Status: DC | PRN
Start: 2014-08-29 — End: 2014-08-31
  Filled 2014-08-29: qty 2

## 2014-08-29 MED ORDER — VANCOMYCIN HCL IN DEXTROSE 1-5 GM/200ML-% IV SOLN
1000.0000 mg | INTRAVENOUS | Status: AC
  Administered 2014-08-29: 1000 mg via INTRAVENOUS
  Filled 2014-08-29: qty 200

## 2014-08-29 MED ORDER — SODIUM CHLORIDE 0.9 % IR SOLN
Status: DC | PRN
  Administered 2014-08-29: 6000 mL

## 2014-08-29 MED ORDER — MORPHINE SULFATE 2 MG/ML IJ SOLN: 0.5000 mg | INTRAMUSCULAR | Status: DC | PRN

## 2014-08-29 MED ORDER — HYDROMORPHONE HCL 1 MG/ML IJ SOLN
0.2500 mg | INTRAMUSCULAR | Status: DC | PRN
  Administered 2014-08-29 (×2): 0.25 mg via INTRAVENOUS

## 2014-08-29 MED ORDER — GLYCOPYRROLATE 0.2 MG/ML IJ SOLN
INTRAMUSCULAR | Status: DC | PRN
  Administered 2014-08-29: .5 mg via INTRAVENOUS

## 2014-08-29 MED ORDER — NEOSTIGMINE METHYLSULFATE 10 MG/10ML IV SOLN
INTRAVENOUS | Status: DC | PRN
  Administered 2014-08-29: 3 mg via INTRAVENOUS

## 2014-08-29 MED ORDER — BUPIVACAINE HCL (PF) 0.25 % IJ SOLN
INTRAMUSCULAR | Status: DC | PRN
  Administered 2014-08-29: 10 mL

## 2014-08-29 MED ORDER — SODIUM CHLORIDE 0.9 % IV SOLN
INTRAVENOUS | Status: DC
  Administered 2014-08-29: 10:00:00 via INTRAVENOUS

## 2014-08-29 MED ORDER — MUPIROCIN 2 % EX OINT
1.0000 "application " | TOPICAL_OINTMENT | Freq: Two times a day (BID) | CUTANEOUS | Status: DC
  Administered 2014-08-29 – 2014-08-31 (×4): 1 via NASAL
  Filled 2014-08-29 (×2): qty 22

## 2014-08-29 SURGICAL SUPPLY — 93 items
BANDAGE ELASTIC 3 VELCRO ST LF (GAUZE/BANDAGES/DRESSINGS) ×4 IMPLANT
BANDAGE ELASTIC 4 VELCRO ST LF (GAUZE/BANDAGES/DRESSINGS) ×4 IMPLANT
BIT DRILL 2.2 SS TIBIAL (BIT) ×4 IMPLANT
BLADE SAW SGTL 18X1.27X75 (BLADE) ×3 IMPLANT
BLADE SAW SGTL 18X1.27X75MM (BLADE) ×1
BLADE SURG 10 STRL SS (BLADE) ×4 IMPLANT
BLADE SURG 15 STRL LF DISP TIS (BLADE) ×2 IMPLANT
BLADE SURG 15 STRL SS (BLADE) ×2
BLADE SURG ROTATE 9660 (MISCELLANEOUS) IMPLANT
BNDG ESMARK 4X9 LF (GAUZE/BANDAGES/DRESSINGS) ×4 IMPLANT
BNDG GAUZE ELAST 4 BULKY (GAUZE/BANDAGES/DRESSINGS) ×4 IMPLANT
CELLS DAT CNTRL 66122 CELL SVR (MISCELLANEOUS) ×2 IMPLANT
CLOSURE WOUND 1/2 X4 (GAUZE/BANDAGES/DRESSINGS)
CORDS BIPOLAR (ELECTRODE) ×4 IMPLANT
COVER SURGICAL LIGHT HANDLE (MISCELLANEOUS) ×4 IMPLANT
CUFF TOURNIQUET SINGLE 18IN (TOURNIQUET CUFF) ×4 IMPLANT
CUFF TOURNIQUET SINGLE 24IN (TOURNIQUET CUFF) IMPLANT
DERMABOND ADVANCED (GAUZE/BANDAGES/DRESSINGS)
DERMABOND ADVANCED .7 DNX12 (GAUZE/BANDAGES/DRESSINGS) IMPLANT
DRAPE C-ARM 42X72 X-RAY (DRAPES) ×4 IMPLANT
DRAPE IMP U-DRAPE 54X76 (DRAPES) ×4 IMPLANT
DRAPE STERI IOBAN 125X83 (DRAPES) IMPLANT
DRAPE SURG 17X11 SM STRL (DRAPES) ×8 IMPLANT
DRAPE U-SHAPE 47X51 STRL (DRAPES) ×12 IMPLANT
DRSG AQUACEL AG ADV 3.5X10 (GAUZE/BANDAGES/DRESSINGS) ×4 IMPLANT
DURAPREP 26ML APPLICATOR (WOUND CARE) ×4 IMPLANT
ELECT BLADE 4.0 EZ CLEAN MEGAD (MISCELLANEOUS) ×4
ELECT BLADE TIP CTD 4 INCH (ELECTRODE) ×4 IMPLANT
ELECT CAUTERY BLADE 6.4 (BLADE) IMPLANT
ELECT REM PT RETURN 9FT ADLT (ELECTROSURGICAL) ×4
ELECTRODE BLDE 4.0 EZ CLN MEGD (MISCELLANEOUS) ×2 IMPLANT
ELECTRODE REM PT RTRN 9FT ADLT (ELECTROSURGICAL) ×2 IMPLANT
FACESHIELD WRAPAROUND (MASK) ×12 IMPLANT
GAUZE SPONGE 4X4 12PLY STRL (GAUZE/BANDAGES/DRESSINGS) ×4 IMPLANT
GAUZE XEROFORM 1X8 LF (GAUZE/BANDAGES/DRESSINGS) IMPLANT
GAUZE XEROFORM 5X9 LF (GAUZE/BANDAGES/DRESSINGS) ×4 IMPLANT
GLOVE SURG SYN 7.5  E (GLOVE) ×6
GLOVE SURG SYN 7.5 E (GLOVE) ×6 IMPLANT
GOWN STRL REIN XL XLG (GOWN DISPOSABLE) ×8 IMPLANT
GOWN STRL REUS W/TWL XL LVL3 (GOWN DISPOSABLE) ×12 IMPLANT
HANDPIECE INTERPULSE COAX TIP (DISPOSABLE) ×2
HEMI-HIP ARTHROPLASTY S/N (Capitated) ×4 IMPLANT
K-WIRE 1.6 (WIRE) ×4
K-WIRE FX5X1.6XNS BN SS (WIRE) ×4
KIT BASIN OR (CUSTOM PROCEDURE TRAY) ×4 IMPLANT
KIT ROOM TURNOVER OR (KITS) ×4 IMPLANT
KWIRE FX5X1.6XNS BN SS (WIRE) ×4 IMPLANT
NEEDLE 22X1 1/2 (OR ONLY) (NEEDLE) ×8 IMPLANT
NS IRRIG 1000ML POUR BTL (IV SOLUTION) ×4 IMPLANT
PACK ORTHO EXTREMITY (CUSTOM PROCEDURE TRAY) ×4 IMPLANT
PACK TOTAL JOINT (CUSTOM PROCEDURE TRAY) ×4 IMPLANT
PACK UNIVERSAL I (CUSTOM PROCEDURE TRAY) IMPLANT
PAD ARMBOARD 7.5X6 YLW CONV (MISCELLANEOUS) ×8 IMPLANT
PAD CAST 4YDX4 CTTN HI CHSV (CAST SUPPLIES) IMPLANT
PADDING CAST ABS 4INX4YD NS (CAST SUPPLIES) ×2
PADDING CAST ABS COTTON 4X4 ST (CAST SUPPLIES) ×2 IMPLANT
PADDING CAST COTTON 4X4 STRL (CAST SUPPLIES)
PADDING CAST COTTON 6X4 STRL (CAST SUPPLIES) IMPLANT
PEN SKIN MARKING BROAD (MISCELLANEOUS) IMPLANT
PLATE NARROW DVR RIGHT (Plate) ×4 IMPLANT
RTRCTR WOUND ALEXIS 18CM MED (MISCELLANEOUS) ×4
SCREW LOCK 12X2.7X 3 LD (Screw) ×2 IMPLANT
SCREW LOCK 20X2.7X 3 LD TPR (Screw) ×4 IMPLANT
SCREW LOCK 22X2.7X 3 LD TPR (Screw) ×8 IMPLANT
SCREW LOCKING 2.7X12MM (Screw) ×2 IMPLANT
SCREW LOCKING 2.7X13MM (Screw) ×12 IMPLANT
SCREW LOCKING 2.7X20MM (Screw) ×4 IMPLANT
SCREW LOCKING 2.7X22MM (Screw) ×8 IMPLANT
SET HNDPC FAN SPRY TIP SCT (DISPOSABLE) ×2 IMPLANT
SPLINT FIBERGLASS 3X12 (CAST SUPPLIES) ×4 IMPLANT
SPONGE LAP 4X18 X RAY DECT (DISPOSABLE) IMPLANT
STRIP CLOSURE SKIN 1/2X4 (GAUZE/BANDAGES/DRESSINGS) IMPLANT
SUT ETHIBOND 2 V 37 (SUTURE) ×4 IMPLANT
SUT ETHILON 3 0 PS 1 (SUTURE) ×4 IMPLANT
SUT ETHILON 4 0 PS 2 18 (SUTURE) IMPLANT
SUT MNCRL AB 3-0 PS2 18 (SUTURE) IMPLANT
SUT MNCRL AB 4-0 PS2 18 (SUTURE) IMPLANT
SUT PROLENE 3 0 PS 2 (SUTURE) IMPLANT
SUT VIC AB 0 CT1 27 (SUTURE) ×2
SUT VIC AB 0 CT1 27XBRD ANBCTR (SUTURE) ×2 IMPLANT
SUT VIC AB 2-0 CT1 27 (SUTURE) ×4
SUT VIC AB 2-0 CT1 TAPERPNT 27 (SUTURE) ×4 IMPLANT
SUT VIC AB 2-0 FS1 27 (SUTURE) ×4 IMPLANT
SUT VIC AB 3-0 FS2 27 (SUTURE) IMPLANT
SYR CONTROL 10ML LL (SYRINGE) ×8 IMPLANT
TOWEL OR 17X24 6PK STRL BLUE (TOWEL DISPOSABLE) ×4 IMPLANT
TOWEL OR 17X26 10 PK STRL BLUE (TOWEL DISPOSABLE) ×8 IMPLANT
TOWEL OR NON WOVEN STRL DISP B (DISPOSABLE) ×4 IMPLANT
TRAY FOLEY CATH 16FR SILVER (SET/KITS/TRAYS/PACK) ×4 IMPLANT
TUBE CONNECTING 12'X1/4 (SUCTIONS) ×1
TUBE CONNECTING 12X1/4 (SUCTIONS) ×3 IMPLANT
TUBE SUCT ARGYLE STRL (TUBING) ×4 IMPLANT
UNDERPAD 30X30 INCONTINENT (UNDERPADS AND DIAPERS) ×8 IMPLANT

## 2014-08-29 NOTE — Op Note (Signed)
Right Hip Hemi Arthroplasty  Procedure Note Kayla Kent   [id number]  Pre-op Diagnosis: Right Hip Fracture     Post-op Diagnosis: same   Operative Procedures  1. Prosthetic replacement of right femoral neck fracture. CPT [phone]  Personnel:  Primary Surgeon: N. Eduard Roux, MD Assisting Surgeon: Jean Rosenthal, MD necessary for the timely completion of the procedure Assistant: Benita Stabile, PA-C   Anesthesia: general  Prosthesis: Tamala Julian and Nephew Femur: Polar 2 STD Head: 46 in 28 mm size: +0 Bearing Type: Bipolar  Date of Service: 08/28/2014 - 08/29/2014  Total Hip Arthroplasty (Anterior Approach) Op Note:  After informed consent was obtained and the operative extremity marked in the holding area, the patient was brought back to the operating room and placed supine on the HANA table. Next, the operative extremity was prepped and draped in normal sterile fashion. Surgical timeout occurred verifying patient identification, surgical site, surgical procedure and administration of antibiotics.  A modified anterior Smith-Peterson approach to the hip was performed, using the interval between tensor fascia lata and sartorius.  Dissection was carried bluntly down onto the anterior hip capsule. The lateral femoral circumflex vessels were identified and coagulated. A capsulotomy was performed and the capsular flaps tagged for later repair.  Fluoroscopy was utilized to prepare for the femoral neck cut. The neck fracture was identified and freshened up with a saw. The femoral head was removed, the acetabular rim was cleared of soft tissue.  There was no signs of arthritis in the acetabulum.  We turned our attention to the femur. After placing the femoral hook, the leg was taken to externally rotated, extended and adducted position taking care to perform soft tissue releases to allow for adequate mobilization of the femur. Soft tissue was cleared from the shoulder of the greater trochanter and  the hook elevator used to improve exposure of the proximal femur. Sequential broaching performed up to a size 2. Trial neck and head were placed. The leg was brought back up to neutral and the construct reduced. The position and sizing of components, offset and leg lengths were checked using fluoroscopy. Stability of the construct was checked in extension and external rotation without any subluxation or impingement of prosthesis. We dislocated the prosthesis, dropped the leg back into position, removed trial components, and irrigated copiously. The final stem and head was then placed, the leg brought back up, the system reduced and fluoroscopy used to verify positioning.  We irrigated, obtained hemostasis and closed the capsule using #5 ethibond suture.  The fascia was closed with #1 vicryl plus, the deep fat layer was closed with 0 vicryl, the subcutaneous layers closed with 2.0 Vicryl Plus and the skin closed with staples. A sterile dressing was applied. The patient was awakened in the operating room and taken to recovery in stable condition. All sponge, needle, and instrument counts were correct at the end of the case.   Position: supine  Complications: none.  Time Out: performed   Drains/Packing: none  Estimated blood loss: 200 cc  Returned to Recovery Room: in good condition.   Antibiotics: yes   Mechanical VTE (DVT) Prophylaxis: sequential compression devices, TED thigh-high  Chemical VTE (DVT) Prophylaxis: lovenox Fluid Replacement  Crystalloid: 1000 cc Blood: none  FFP: none   Specimens Removed: 1 to pathology   Sponge and Instrument Count Correct? yes   PACU: portable radiograph - low AP   Admission: inpatient status, start PT & OT POD#1  Plan/RTC: Return in 2 weeks for staple  removal. Return in 6 weeks to see MD.  Weight Bearing/Load Lower Extremity: full  Hip precautions: none Suture Removal: 10-14 days  Betadine to incision twice daily once dressing is removed on  POD#7  N. Eduard Roux, MD [facility] [phone] 9:03 PM      DATE OF SURGERY: 08/29/2014  PREOPERATIVE DIAGNOSIS: Right Hip Fracture, Right Wrist Fracture  POSTOPERATIVE DIAGNOSIS: same  PROCEDURE: Open reduction and internal fixation, Right distal radius. CPT [phone] 3 or more fragments  IMPLANT:  Implant Name Type Inv. Item Serial No. Manufacturer Lot No. LRB No. Used  LOG [id number] -  BIOMET DVR HAND INNOVATIONS SET - 1         LOG [id number] -  BIOMET/DEPUY ACE LOCKING SMALL FRAGMENT SET - 1         stem std.ti/ha with collar Stem   [employer] [id number] Right 1  20mmI.D. 36mm O.D. tandem cocrshell/uhmwpe liner bipolar Liner   [employer] TRAUMA [id number] Right 1  HEAD 28MM 0 - [id number] Hips HEAD 28MM 0  [employer] ORTH[employer]EDICS [id number] Right 1  PLATE NARROW DVR RIGHT - [id number] Plate PLATE NARROW DVR RIGHT  BIOMET TRAUMA DP  Right 1  SCREW LOCKING 2.7X13MM - [id number] Screw SCREW LOCKING 2.7X13MM  BIOMET TRAUMA DP  Right 3  SCREW LOCKING 2.7X22MM - [id number] Screw SCREW LOCKING 2.7X22MM  BIOMET TRAUMA DP  Right 4  SCREW LOCKING 2.7X12MM - [id number] Screw SCREW LOCKING 2.7X12MM  BIOMET TRAUMA DP  Right 1  SCREW LOCKING 2.7X20MM - [id number] Screw SCREW LOCKING 2.7X20MM   BIOMET TRAUMA DP   Right 2     SURGEON: Surgeon(s) and Role: Naiping Eduard Roux, MD - Primary  ANESTHESIA: General  TOURNIQUET TIME:  Total Tourniquet Time Documented: Upper Arm (Right) - 29 minutes Total: Upper Arm (Right) - 29 minutes   BLOOD LOSS: Minimal.  COMPLICATIONS: None.  PATHOLOGY: None.  TIME OUT: Performed before the start of procedure.  INDICATIONS: The patient is a 80 y.o. female who presented with a displaced distal radius fracture indicated for osteosynthesis.  DESCRIPTION OF PROCEDURE:  The patient was identified in the preoperative holding area.  The operative site was marked by the surgeon and confirmed by the patient.  He was brought back to the  operating room.  Anesthesia was induced by the anesthesia team.  A well padded nonsterile tourniquet was placed. The operative extremity was prepped and draped in standard sterile fashion.  A timeout was performed.  Preoperative antibiotics were given. The extremity was exsanguinated, tourniquet inflated to 250 mmHg.  A FCR approach was used.  Dissection through the sheath of FCR was carried out and the FCR tendon was retracted. The floor of FCR sheath was released. Flexor tendons and median nerve were retracted ulnarly and protected. Pronator quadratus was released from distal radius. Fracture lines were visualized. Fracture was reduced under fluoroscopy. A volar locking plate was applied to the volar surface of the distal radius. 7 distal locking screws and 3 shaft  screws were inserted. Fluoroscopy was used to show adequate reduction. The tourniquet was deflated. Hemostasis was achieved. The wound was irrigated. Incision closed in layers. Sterile dressing applied. Wrist immobilized in a removable brace. The patient was transferred to the recovery room in stable condition after all counts were correct.  POSTOPERATIVE PLAN: The patient will remain in the brace until instructed. Sutures will be removed in two weeks. About four weeks after surgery, will start wrist range of motion exercises, but in  the meantime before then she will start aggressive finger range of motion exercises without resistance.  The "six pack of hand exercises" handout was given to the patient.  Azucena Cecil, MD [facility] [phone] 9:10 PM

## 2014-08-29 NOTE — Progress Notes (Addendum)
[facility] PROGRESS NOTE  Kayla Kent KNL:[id number] DOB: 1932/07/13 DOA: 08/28/2014 PCP: Cathlean Cower, MD  Assessment/Plan: 1. Right femoral neck fracture/distal radial fracture -Status post mechanical fall at home, imaging studies showing right femoral neck fracture with varus angulation. Wrist x-ray showed comminuted displaced posteriorly angulated fracture of the distal radius with extension to the radiocarpal joint space. -Patient is highly active at baseline, independent with all activities of daily living and instrumental activities of daily living. -she denies having history of significant shortness of breath or chest pain on exertion -She does not appear to have active cardiopulmonary issues.Plan for ORIF.  2.  Hypertension -Patient's systolic blood pressures in the 150s to 160s, could be pain driven. -She is on atenolol 50 mg by mouth daily and diltiazem 180 mg by mouth daily -Continue to monitor blood pressures, titrate as needed  3.  Dyslipidemia. -Continue Crestor  Code Status: full code Family Communication:  Disposition Plan: Patient likely to undergo ORIF this afternoon   Consultants:  Orthopedic surgery  Procedures:  Plan for ORIF   HPI/Subjective: Patient is a pleasant 80 year old female with a past medical history of paroxysmal supraventricular tachycardia, hypertension, dyslipidemia, admitted to the medicine service on 08/28/2014 when she presented after having a fall at home. She reported losing her balance and fallen on concrete. Imaging studies revealed a right femoral neck fracture with varus angulation. Orthopedic surgery was consulted.   Objective: Filed Vitals:   08/29/14 1357  BP: 157/66  Pulse: 90  Temp: 98 F (36.7 C)  Resp: 17    Intake/Output Summary (Last 24 hours) at 08/29/14 1615 Last data filed at 08/29/14 1400  Gross per 24 hour  Intake 787.08 ml  Output   1050 ml  Net -262.92 ml   Filed Weights   08/28/14 2325   Weight: 53.524 kg (118 lb)    Exam:   General:  Patient is awake, alert, following commands.  Cardiovascular: regular rate and rhythm normal S1-S2 no murmurs rubs or gallops  Respiratory: clear to auscultation bilaterally  Abdomen: soft nontender nondistended  Musculoskeletal: no edema  Data Reviewed: Basic Metabolic Panel:  Recent Labs Lab 08/28/14 2015  NA 143  K 3.4*  CL 105  CO2 24  GLUCOSE 127*  BUN 16  CREATININE 1.00  CALCIUM 9.1   Liver Function Tests: No results for input(s): AST, ALT, ALKPHOS, BILITOT, PROT, ALBUMIN in the last 168 hours. No results for input(s): LIPASE, AMYLASE in the last 168 hours. No results for input(s): AMMONIA in the last 168 hours. CBC:  Recent Labs Lab 08/28/14 2015  WBC 13.8*  NEUTROABS 12.0*  HGB 12.4  HCT 37.8  MCV 88.3  PLT 173   Cardiac Enzymes: No results for input(s): CKTOTAL, CKMB, CKMBINDEX, TROPONINI in the last 168 hours. BNP (last 3 results) No results for input(s): PROBNP in the last 8760 hours. CBG: No results for input(s): GLUCAP in the last 168 hours.  Recent Results (from the past 240 hour(s))  Surgical pcr screen     Status: Abnormal   Collection Time: 08/29/14 12:21 AM  Result Value Ref Range Status   MRSA, PCR NEGATIVE NEGATIVE Final   Staphylococcus aureus POSITIVE (A) NEGATIVE Final    Comment:        The Xpert SA Assay (FDA approved for NASAL specimens in patients over 56 years of age), is one component of a comprehensive surveillance program.  Test performance has been validated by [facility] for patients greater than or equal to 1  year old. It is not intended to diagnose infection nor to guide or monitor treatment. RESULT CALLED TO, READ BACK BY AND VERIFIED WITH: Gailen Shelter RN [phone] GREEN R      Studies: Dg Wrist Complete Right  08/28/2014   CLINICAL DATA:  Fall while walking this afternoon. Initial evaluation.  EXAM: RIGHT WRIST - COMPLETE 3+ VIEW  COMPARISON:  None.   FINDINGS: Comminuted posterior angulated displaced distal right radial fracture with extension to the radiocarpal joint space noted. Displaced ulnar styloid fracture.  IMPRESSION: Comminuted displaced posteriorly angulated fracture of the distal radius with extension to the radiocarpal joint space. Displaced ulnar styloid fracture.   Electronically Signed   By: Marcello Moores  Register   On: 08/28/2014 18:48   Dg Hip Complete Right  08/28/2014   CLINICAL DATA:  Fall wall wall and this afternoon. Right lateral hip pain. Initial encounter.  EXAM: RIGHT HIP - COMPLETE 2+ VIEW  COMPARISON:  None.  FINDINGS: There is a right femoral neck fracture with varus angulation. No subluxation or dislocation. Left hip and SI joints are unremarkable.  IMPRESSION: Right femoral neck fracture with varus angulation.   Electronically Signed   By: Rolm Baptise M.D.   On: 08/28/2014 18:48   Dg Chest Port 1 View  08/28/2014   CLINICAL DATA:  Preop evaluation following hip fracture, hypertension  EXAM: PORTABLE CHEST - 1 VIEW  COMPARISON:  07/16/2009  FINDINGS: Cardiac shadow is stable. The lungs are again hyperinflated. Mild increase in central vascular congestion is noted. Mild scarring is noted in the left lung base. No focal confluent infiltrate is seen.  IMPRESSION: Mild vascular congestion.  No other acute abnormality is noted   Electronically Signed   By: Inez Catalina M.D.   On: 08/28/2014 19:21   Dg Femur Right Port  08/28/2014   CLINICAL DATA:  Fall with right hip pain.  EXAM: PORTABLE RIGHT FEMUR - 2 VIEW  COMPARISON:  Right hip same day.  FINDINGS: Images of the right femur excluding the right hip demonstrate no additional fracture sites. Remainder the exam is unremarkable.  IMPRESSION: No acute findings.   Electronically Signed   By: Marin Olp M.D.   On: 08/28/2014 20:37    Scheduled Meds: . atenolol  50 mg Oral Daily  . Chlorhexidine Gluconate Cloth  6 each Topical Daily  . clindamycin (CLEOCIN) IV  900 mg  Intravenous On Call to OR  . diltiazem  180 mg Oral Daily  . levETIRAcetam  250 mg Oral BID  . mupirocin ointment  1 application Nasal BID  . rosuvastatin  5 mg Oral QHS   Continuous Infusions:   Principal Problem:   Fracture of femoral neck, right Active Problems:   HLD (hyperlipidemia)   Essential hypertension   Chronic diastolic CHF (congestive heart failure)   Seizures   Distal radius fracture, right   Hip fracture requiring operative repair    Time spent: 25 min    Kelvin Cellar  [facility] Pager [phone]. If 7PM-7AM, please contact night-coverage at [email or web address], password [facility] 08/29/2014, 4:15 PM  LOS: 1 day

## 2014-08-29 NOTE — Discharge Instructions (Signed)
1. Remove dressing on 09/05/2014.  2. Begin betadine paints to incision twice a day with dry dressing. 3. May get incision wet while showering after staples have been removed.  Do not submerge incision until notified. 4. WBAT 5. Ice the operative 20 minutes on and 20 minutes off around the clock.  6. Avoid any dental visits including dental cleanings for 6 months after surgery.

## 2014-08-29 NOTE — H&P (Signed)

## 2014-08-29 NOTE — Anesthesia Preprocedure Evaluation (Addendum)
Anesthesia Evaluation  Patient identified by MRN, date of birth, ID band Patient awake    Reviewed: Allergy & Precautions, H&P , NPO status , Patient's Chart, lab work & pertinent test results  Airway Mallampati: I  TM Distance: >3 FB Neck ROM: Full    Dental  (+) Teeth Intact, Dental Advisory Given   Pulmonary former smoker,  breath sounds clear to auscultation        Cardiovascular hypertension, Pt. on medications and Pt. on home beta blockers +CHF Rhythm:Regular Rate:Normal  Ablation for PAT   Neuro/Psych Anxiety Depression    GI/Hepatic   Endo/Other    Renal/GU      Musculoskeletal   Abdominal   Peds  Hematology   Anesthesia Other Findings Pt thinks she has been at [facility] since 11\7[facility]15.  I am not sure of her current mental status but she says she lives alone and manages her own affairs.  Reproductive/Obstetrics                           Anesthesia Physical Anesthesia Plan  ASA: III  Anesthesia Plan: General   Post-op Pain Management:    Induction: Intravenous  Airway Management Planned: Oral ETT  Additional Equipment:   Intra-op Plan:   Post-operative Plan: Extubation in OR  Informed Consent: I have reviewed the patients History and Physical, chart, labs and discussed the procedure including the risks, benefits and alternatives for the proposed anesthesia with the patient or authorized representative who has indicated his/her understanding and acceptance.   Dental advisory given  Plan Discussed with: CRNA, Anesthesiologist and Surgeon  Anesthesia Plan Comments: (Will not use Scopolamine patch due to pt age and possible current confusion.)      Anesthesia Quick Evaluation

## 2014-08-29 NOTE — Anesthesia Procedure Notes (Signed)
Procedure Name: Intubation Date/Time: 08/29/2014 6:06 PM Performed by: Sampson Si E Pre-anesthesia Checklist: Patient identified, Emergency Drugs available, Suction available, Patient being monitored and Timeout performed Patient Re-evaluated:Patient Re-evaluated prior to inductionOxygen Delivery Method: Circle system utilized Preoxygenation: Pre-oxygenation with 100% oxygen Intubation Type: IV induction Ventilation: Mask ventilation without difficulty Laryngoscope Size: Mac and 3 Grade View: Grade II Tube type: Oral Tube size: 7.0 mm Number of attempts: 1 Airway Equipment and Method: Stylet Placement Confirmation: ETT inserted through vocal cords under direct vision,  positive ETCO2 and breath sounds checked- equal and bilateral Secured at: 21 cm Tube secured with: Tape Dental Injury: Teeth and Oropharynx as per pre-operative assessment

## 2014-08-29 NOTE — Anesthesia Postprocedure Evaluation (Signed)
  Anesthesia Post-op Note  Patient: Kayla Kent  Procedure(s) Performed: Procedure(s) with comments: Right Hip Hemi Arthroplasty (Right) - Hip procedure 1st wants Peg Board, [employer], Big [employer]. Wrist - wants [employer].  Surgeon available at 1645.  Surgeon to inform sales reps. OPEN REDUCTION INTERNAL FIXATION (ORIF) DISTAL RADIAL FRACTURE (Right)  Patient Location: PACU  Anesthesia Type:General  Level of Consciousness: awake, alert  and oriented  Airway and Oxygen Therapy: Patient Spontanous Breathing  Post-op Pain: none  Post-op Assessment: Post-op Vital signs reviewed  Post-op Vital Signs: Reviewed  Last Vitals:  Filed Vitals:   08/29/14 2130  BP: 131/43  Pulse: 83  Temp:   Resp: 21    Complications: No apparent anesthesia complications

## 2014-08-29 NOTE — Transfer of Care (Signed)
Immediate Anesthesia Transfer of Care Note  Patient: Kayla Kent  Procedure(s) Performed: Procedure(s) with comments: Right Hip Hemi Arthroplasty (Right) - Hip procedure 1st wants Peg Board, [employer], [employer]. Wrist - wants [employer].  Surgeon available at 1645.  Surgeon to inform sales reps. OPEN REDUCTION INTERNAL FIXATION (ORIF) DISTAL RADIAL FRACTURE (Right)  Patient Location: PACU  Anesthesia Type:General  Level of Consciousness: awake and alert   Airway & Oxygen Therapy: Patient Spontanous Breathing and Patient connected to face mask oxygen  Post-op Assessment: Report given to PACU RN and Post -op Vital signs reviewed and stable  Post vital signs: Reviewed and stable  Complications: No apparent anesthesia complications

## 2014-08-29 NOTE — Progress Notes (Signed)
Patient is positive for Staph Aureus per MRSA surgical PCR. Will initiate protocol. KYoung RN

## 2014-08-29 NOTE — Progress Notes (Signed)
INITIAL NUTRITION ASSESSMENT  DOCUMENTATION CODES Per approved criteria  -Not Applicable   INTERVENTION: Advance diet as medically appropriate, add interventions accordingly RD to follow for nutrition care plan  NUTRITION DIAGNOSIS: Increased nutrient needs related to hip fracture, post-op healing as evidenced by estimated nutrition needs  Goal: Pt to meet >/= 90% of their estimated nutrition needs   Monitor:  PO diet advancement & intake, weight, labs, I/O's  Reason for Assessment: Consult  80 y.o. female  Admitting Dx: Fracture of femoral neck, right  ASSESSMENT: 80 y.o. Female quite healthy at baseline, was doing yard work when she suffered a mechanical fall.  RD consulted via Hip Fracture Protocol.  RD unable to obtain nutrition hx or complete Nutrition Focused Physical Exam at this time .  Pt sleeping.  Unable to wake.  Pt with increased nutrition needs given fracture, post-op healing.    RD to monitor PO diet advancement, add nutrition supplements when/as able.  Height: Ht Readings from Last 1 Encounters:  08/28/14 5\' 6"  (1.676 m)    Weight: Wt Readings from Last 1 Encounters:  08/28/14 118 lb (53.524 kg)    Ideal Body Weight: 130 lb  % Ideal Body Weight: 91%  Wt Readings from Last 10 Encounters:  08/28/14 118 lb (53.524 kg)  03/28/14 126 lb (57.153 kg)  02/08/14 122 lb 3.2 oz (55.43 kg)  12/21/13 123 lb 6.4 oz (55.974 kg)  03/28/13 128 lb (58.06 kg)  07/27/12 125 lb (56.7 kg)  12/29/11 140 lb (63.504 kg)  12/08/11 139 lb 4 oz (63.163 kg)  10/01/11 139 lb (63.05 kg)  12/03/10 138 lb 8 oz (62.823 kg)    Usual Body Weight: 126 lb -- June 2015  % Usual Body Weight: 94%  BMI:  Body mass index is 19.05 kg/(m^2).  Estimated Nutritional Needs: Kcal: 1400-1600 Protein: 70-80 gm Fluid: >/= 1.5 L  Skin: Intact  Diet Order: Diet NPO time specified  EDUCATION NEEDS: -No education needs identified at this time   Intake/Output Summary (Last 24  hours) at 08/29/14 1127 Last data filed at 08/29/14 0856  Gross per 24 hour  Intake 677.08 ml  Output    450 ml  Net 227.08 ml    Labs:   Recent Labs Lab 08/28/14 2015  NA 143  K 3.4*  CL 105  CO2 24  BUN 16  CREATININE 1.00  CALCIUM 9.1  GLUCOSE 127*    Scheduled Meds: . atenolol  50 mg Oral Daily  . Chlorhexidine Gluconate Cloth  6 each Topical Daily  . clindamycin (CLEOCIN) IV  900 mg Intravenous On Call to OR  . diltiazem  180 mg Oral Daily  . levETIRAcetam  250 mg Oral BID  . mupirocin ointment  1 application Nasal BID  . rosuvastatin  5 mg Oral QHS  . vancomycin  1,000 mg Intravenous On Call to OR    Continuous Infusions:   Past Medical History  Diagnosis Date  . Anxiety   . Paroxysmal supraventricular tachycardia   . Uterine prolapse without mention of vaginal wall prolapse   . HTN (hypertension)   . HLD (hyperlipidemia)   . Intolerance of drug     orthostatic  . PVD (peripheral vascular disease)   . Allergic rhinitis   . Syncope and collapse     Past Surgical History  Procedure Laterality Date  . Corrective eye surgery      as a child  . Cholecystectomy    . Tonsillectomy    . Ivd  removed    . Stress cardiolite  08/05/93  . Rf ablation psvt      summer '10    Arthur Holms, [place], LDN Pager #: [phone] After-Hours Pager #: [phone]

## 2014-08-29 NOTE — Progress Notes (Signed)
Orthopedic Tech Progress Note Patient Details:  Kayla Kent [birth year] [id number] No OHF available at this time. When frame becomes available patient will receive one.  Patient ID: Kayla Kent, female   DOB: 03-26-1932, 80 y.o.   MRN: [id number]   [name] 08/29/2014, 9:39 AM

## 2014-08-30 ENCOUNTER — Encounter (HOSPITAL_COMMUNITY): Payer: Self-pay | Admitting: Orthopaedic Surgery

## 2014-08-30 LAB — CBC
HCT: 33.2 % — ABNORMAL LOW (ref 36.0–46.0)
HCT: 35.6 % — ABNORMAL LOW (ref 36.0–46.0)
HEMOGLOBIN: 10.7 g/dL — AB (ref 12.0–15.0)
Hemoglobin: 12 g/dL (ref 12.0–15.0)
MCH: 28.6 pg (ref 26.0–34.0)
MCH: 29.6 pg (ref 26.0–34.0)
MCHC: 32.2 g/dL (ref 30.0–36.0)
MCHC: 33.7 g/dL (ref 30.0–36.0)
MCV: 88.8 fL (ref 78.0–100.0)
MCV: 87.7 fL (ref 78.0–100.0)
PLATELETS: 121 10*3/uL — AB (ref 150–400)
Platelets: 134 10*3/uL — ABNORMAL LOW (ref 150–400)
RBC: 3.74 MIL/uL — ABNORMAL LOW (ref 3.87–5.11)
RBC: 4.06 MIL/uL (ref 3.87–5.11)
RDW: 14 % (ref 11.5–15.5)
RDW: 13.9 % (ref 11.5–15.5)
WBC: 7.5 10*3/uL (ref 4.0–10.5)
WBC: 10.5 10*3/uL (ref 4.0–10.5)

## 2014-08-30 LAB — CREATININE, SERUM
Creatinine, Ser: 0.88 mg/dL (ref 0.50–1.10)
GFR calc Af Amer: 70 mL/min — ABNORMAL LOW (ref >90)
GFR calc non Af Amer: 60 mL/min — ABNORMAL LOW (ref >90)

## 2014-08-30 LAB — BASIC METABOLIC PANEL
Anion gap: 13 (ref 5–15)
BUN: 17 mg/dL (ref 6–23)
CALCIUM: 7.9 mg/dL — AB (ref 8.4–10.5)
CO2: 20 mEq/L (ref 19–32)
Chloride: 106 mEq/L (ref 96–112)
Creatinine, Ser: 0.9 mg/dL (ref 0.50–1.10)
GFR calc Af Amer: 68 mL/min — ABNORMAL LOW (ref >90)
GFR calc non Af Amer: 59 mL/min — ABNORMAL LOW (ref >90)
GLUCOSE: 139 mg/dL — AB (ref 70–99)
Potassium: 4.9 mEq/L (ref 3.7–5.3)
Sodium: 139 mEq/L (ref 137–147)

## 2014-08-30 MED ORDER — ALPRAZOLAM 0.25 MG PO TABS: 0.2500 mg | ORAL_TABLET | Freq: Three times a day (TID) | ORAL | Status: DC | PRN

## 2014-08-30 MED ORDER — HYDROCODONE-ACETAMINOPHEN 5-325 MG PO TABS: 1.0000 | ORAL_TABLET | Freq: Four times a day (QID) | ORAL | Status: DC | PRN

## 2014-08-30 MED ORDER — SENNA 8.6 MG PO TABS
1.0000 | ORAL_TABLET | Freq: Two times a day (BID) | ORAL | Status: DC
Start: 2014-08-30 — End: 2014-10-03

## 2014-08-30 MED ORDER — POLYETHYLENE GLYCOL 3350 17 G PO PACK: 17.0000 g | PACK | Freq: Every day | ORAL | Status: DC | PRN

## 2014-08-30 NOTE — Discharge Summary (Addendum)
Physician Discharge Summary  Kayla Kent NWG:[id number] DOB: 1931-10-30 DOA: 08/28/2014  PCP: Cathlean Cower, MD  Admit date: 08/28/2014 Discharge date: 08/30/2014  Time spent: 35 minutes  Recommendations for Outpatient Follow-up:  1. Patient to remain in left arm brace with sutures to be removed in 2 weeks. Patient to start range of motion exercises in 4 weeks, in the meantime will start aggressive finger range of motion exercises without resistance. 2. Full weight bearing to lower extremity, suture removal in 10-14 days. Betadine to incision twice daily was dressing is removed on postoperative day #7.  Discharge Diagnoses:  Principal Problem:   Fracture of femoral neck, right Active Problems:   HLD (hyperlipidemia)   Essential hypertension   Chronic diastolic CHF (congestive heart failure)   Seizures   Distal radius fracture, right   Hip fracture requiring operative repair   Discharge Condition: stable  Diet recommendation: heart healthy diet  Filed Weights   08/28/14 2325  Weight: 53.524 kg (118 lb)    History of present illness:  Kayla Kent is a 80 y.o. female quite healthy at baseline, was doing yard work when she suffered a mechanical fall. No LOC during fall. Had immediate pain to R wrist and R hip and inability to walk. Pain worse with movement better when still.  Hospital Course:  Patient is a pleasant 80 year old female with a past medical history of paroxysmal supraventricular tachycardia, hypertension, dyslipidemia, admitted to the medicine service on 08/28/2014 when she presented after having a fall at home. She reported losing her balance and fallen on concrete. Imaging studies revealed a right femoral neck fracture with varus angulation and distal radial fracture. Orthopedic surgery was consulted. Patient was taken to the OR on 08/29/2014 where she underwent prostatic replacement of right femoral neck fracture an open reduction internal fixation of right  distal radial fracture.She tolerated procedure well there are no immediate complications. Physical therapy was consulted. Labs otherwise remained stable, hemoglobin trending down from 12.0 to 10.7 on 08/30/2014 likely secondary to surgical intervention. Social work consulted for dispo planning.   Addendum: Patient was seen and evaluated on 08/31/2014. There were no issues overnight, she is sitting up at bedside having her breakfast. Has no complaints and is looking forward to being discharge to SNF to start rehab. Labs were reviewed, Hg remains stable at 10.2. She worked well with PT. Will discharge her to SNF today.   1. Right femoral neck fracture/distal radial fracture -Status post mechanical fall at home, imaging studies showing right femoral neck fracture with varus angulation. Wrist x-ray showed comminuted displaced posteriorly angulated fracture of the distal radius with extension to the radiocarpal joint space. -patient was taken to the OR on 08/29/2014 where she underwent prostetic replacement of right femoral neck fracture an open reduction internal fixation of right distal radial fracture. -patient tolerated procedure well there are no me to complications.  2. Hypertension -Patient's systolic blood pressures in the 150s to 160s, could be pain driven. -She is on atenolol 50 mg by mouth daily and diltiazem 180 mg by mouth daily -blood pressures improving with systolic blood pressures in the 120s to 130s.  3. Dyslipidemia. -Continue Crestor  Procedures:  -S/P prostetic replacement of right femoral neck fracture an open reduction internal fixation of right distal radial fracture, procedure performed on 08/29/2014.  Consultations:  Orthopedic surgery  Discharge Exam: Filed Vitals:   08/30/14 0800  BP:   Pulse:   Temp:   Resp: 16    General: patient is  awake and alert oriented, states feeling well has no complaints Cardiovascular: regular rate and rhythm normal S1-S2 no  murmurs rubs or gallops Respiratory: clear to auscultation bilaterally no wheezing rhonchi or rales Abdomen: soft nontender nondistended Extremities: Right upper extremity soft cast, hands warm, no evidence of neurovascular compromise. Right hip surgical incision site appears clean.   Discharge Instructions You were cared for by a hospitalist during your hospital stay. If you have any questions about your discharge medications or the care you received while you were in the hospital after you are discharged, you can call the unit and asked to speak with the hospitalist on call if the hospitalist that took care of you is not available. Once you are discharged, your primary care physician will handle any further medical issues. Please note that NO REFILLS for any discharge medications will be authorized once you are discharged, as it is imperative that you return to your primary care physician (or establish a relationship with a primary care physician if you do not have one) for your aftercare needs so that they can reassess your need for medications and monitor your lab values.  Discharge Instructions    Weight bearing as tolerated    Complete by:  As directed           Current Discharge Medication List    START taking these medications   Details  acetaminophen (TYLENOL) 500 MG tablet Take 1 tablet (500 mg total) by mouth every 6 (six) hours as needed. Qty: 30 tablet, Refills: 0    enoxaparin (LOVENOX) 40 MG/0.4ML injection Inject 0.4 mLs (40 mg total) into the skin daily. Qty: 14 Syringe, Refills: 0    HYDROcodone-acetaminophen (NORCO/VICODIN) 5-325 MG per tablet Take 1-2 tablets by mouth every 6 (six) hours as needed for moderate pain. Qty: 15 tablet, Refills: 0    polyethylene glycol (MIRALAX / GLYCOLAX) packet Take 17 g by mouth daily as needed for mild constipation. Qty: 14 each, Refills: 0    senna (SENOKOT) 8.6 MG TABS tablet Take 1 tablet (8.6 mg total) by mouth 2 (two) times  daily. Qty: 120 each, Refills: 0      CONTINUE these medications which have CHANGED   Details  ALPRAZolam (XANAX) 0.25 MG tablet Take 1 tablet (0.25 mg total) by mouth 3 (three) times daily as needed for anxiety. Qty: 15 tablet, Refills: 0      CONTINUE these medications which have NOT CHANGED   Details  aspirin 81 MG EC tablet Take 81 mg by mouth daily.      atenolol (TENORMIN) 50 MG tablet Take 50 mg by mouth daily.    cilostazol (PLETAL) 100 MG tablet Take one-half tablet by mouth in the morning and one tablet in the evening. Qty: 135 tablet, Refills: 3    diltiazem (DILACOR XR) 180 MG 24 hr capsule Take 180 mg by mouth daily.    levETIRAcetam (KEPPRA) 250 MG tablet Take 250 mg by mouth 2 (two) times daily.    rosuvastatin (CRESTOR) 5 MG tablet Take 5 mg by mouth at bedtime.      STOP taking these medications     loperamide (IMODIUM) 2 MG capsule        Allergies  Allergen Reactions  . Chocolate   . Codeine   . Diazepam   . Fruit & Vegetable Daily [Nutritional Supplements] Hives and Swelling    peaches  . Iodine   . Iohexol      Code: HIVES, Desc: pt gets 13  hr pre-meds, Onset Date: [id number]   . Latex Hives  . Peach [Prunus Persica]   . Peanut-Containing Drug Products Hives and Swelling  . Penicillins   . Strawberry   . Sulfonamide Derivatives   . Wheat     Shortness of breath   Follow-up Information    Follow up with Marianna Payment, MD In 2 weeks.   Specialty:  Orthopedic Surgery   Why:  For suture removal, For wound re-check   Contact information:   [place] [phone]        The results of significant diagnostics from this hospitalization (including imaging, microbiology, ancillary and laboratory) are listed below for reference.    Significant Diagnostic Studies: Dg Wrist Complete Right  08/28/2014   CLINICAL DATA:  Fall while walking this afternoon. Initial evaluation.  EXAM: RIGHT WRIST - COMPLETE  3+ VIEW  COMPARISON:  None.  FINDINGS: Comminuted posterior angulated displaced distal right radial fracture with extension to the radiocarpal joint space noted. Displaced ulnar styloid fracture.  IMPRESSION: Comminuted displaced posteriorly angulated fracture of the distal radius with extension to the radiocarpal joint space. Displaced ulnar styloid fracture.   Electronically Signed   By: Marcello Moores  Register   On: 08/28/2014 18:48   Dg Hip Complete Right  08/28/2014   CLINICAL DATA:  Fall wall wall and this afternoon. Right lateral hip pain. Initial encounter.  EXAM: RIGHT HIP - COMPLETE 2+ VIEW  COMPARISON:  None.  FINDINGS: There is a right femoral neck fracture with varus angulation. No subluxation or dislocation. Left hip and SI joints are unremarkable.  IMPRESSION: Right femoral neck fracture with varus angulation.   Electronically Signed   By: Rolm Baptise M.D.   On: 08/28/2014 18:48   Dg Hip Operative Right  08/29/2014   CLINICAL DATA:  Right femoral neck fracture. Arthroplasty. Initial encounter.  EXAM: DG OPERATIVE RIGHT HIP 1-2 VIEWS  TECHNIQUE: Fluoroscopic spot image(s) were submitted for interpretation post-operatively.  COMPARISON:  Radiographs 08/28/2014.  FINDINGS: Two spot fluoroscopic images of the right hip demonstrate interval resection of the right femoral head and bipolar hemiarthroplasty. The hardware appears well positioned. There is no evidence of acute fracture or dislocation. There is gas within the operative bed.  IMPRESSION: Intraoperative views following right hip hemiarthroplasty. No demonstrated complication.   Electronically Signed   By: Camie Patience M.D.   On: 08/29/2014 20:12   Pelvis Portable  08/29/2014   CLINICAL DATA:  Status post right hip replacement after intertrochanteric fracture.  EXAM: PORTABLE PELVIS 1-2 VIEWS  COMPARISON:  Plain films of right hip 08/28/2014.  FINDINGS: Bipolar right hip hemiarthroplasty is in place. The device is located. No fracture is  seen. Surgical staples and gas in the soft tissues are noted.  IMPRESSION: Right hip replacement without evidence of complication.   Electronically Signed   By: Inge Rise M.D.   On: 08/29/2014 23:40   Dg Chest Port 1 View  08/28/2014   CLINICAL DATA:  Preop evaluation following hip fracture, hypertension  EXAM: PORTABLE CHEST - 1 VIEW  COMPARISON:  07/16/2009  FINDINGS: Cardiac shadow is stable. The lungs are again hyperinflated. Mild increase in central vascular congestion is noted. Mild scarring is noted in the left lung base. No focal confluent infiltrate is seen.  IMPRESSION: Mild vascular congestion.  No other acute abnormality is noted   Electronically Signed   By: Inez Catalina M.D.   On: 08/28/2014 19:21   Dg Femur Right Port  08/28/2014   CLINICAL DATA:  Fall with right hip pain.  EXAM: PORTABLE RIGHT FEMUR - 2 VIEW  COMPARISON:  Right hip same day.  FINDINGS: Images of the right femur excluding the right hip demonstrate no additional fracture sites. Remainder the exam is unremarkable.  IMPRESSION: No acute findings.   Electronically Signed   By: Marin Olp M.D.   On: 08/28/2014 20:37    Microbiology: Recent Results (from the past 240 hour(s))  Surgical pcr screen     Status: Abnormal   Collection Time: 08/29/14 12:21 AM  Result Value Ref Range Status   MRSA, PCR NEGATIVE NEGATIVE Final   Staphylococcus aureus POSITIVE (A) NEGATIVE Final    Comment:        The Xpert SA Assay (FDA approved for NASAL specimens in patients over 59 years of age), is one component of a comprehensive surveillance program.  Test performance has been validated by [employer] for patients greater than or equal to 77 year old. It is not intended to diagnose infection nor to guide or monitor treatment. RESULT CALLED TO, READ BACK BY AND VERIFIED WITH: Gailen Shelter RN [phone] GREEN R      Labs: Basic Metabolic Panel:  Recent Labs Lab 08/28/14 2015 08/29/14 2350 08/30/14 0430  NA 143   --  139  K 3.4*  --  4.9  CL 105  --  106  CO2 24  --  20  GLUCOSE 127*  --  139*  BUN 16  --  17  CREATININE 1.00 0.88 0.90  CALCIUM 9.1  --  7.9*   Liver Function Tests: No results for input(s): AST, ALT, ALKPHOS, BILITOT, PROT, ALBUMIN in the last 168 hours. No results for input(s): LIPASE, AMYLASE in the last 168 hours. No results for input(s): AMMONIA in the last 168 hours. CBC:  Recent Labs Lab 08/28/14 2015 08/29/14 2350 08/30/14 0430  WBC 13.8* 10.5 7.5  NEUTROABS 12.0*  --   --   HGB 12.4 12.0 10.7*  HCT 37.8 35.6* 33.2*  MCV 88.3 87.7 88.8  PLT 173 134* 121*   Cardiac Enzymes: No results for input(s): CKTOTAL, CKMB, CKMBINDEX, TROPONINI in the last 168 hours. BNP: BNP (last 3 results) No results for input(s): PROBNP in the last 8760 hours. CBG: No results for input(s): GLUCAP in the last 168 hours.     SignedKelvin Cellar  [facility] 08/30/2014, 11:47 AM

## 2014-08-30 NOTE — Clinical Social Work Psychosocial (Signed)
Clinical Social Work Department BRIEF PSYCHOSOCIAL ASSESSMENT 08/30/2014  Patient:  Kayla Kent, Kayla Kent     Account Number:  [id number]     Admit date:  08/28/2014  Clinical Social Worker:  Wylene Men  Date/Time:  08/30/2014 12:31 PM  Referred by:  Physician  Date Referred:  08/30/2014 Referred for  SNF Placement  Psychosocial assessment   Other Referral:   none   Interview type:  Patient Other interview type:   none    PSYCHOSOCIAL DATA Living Status:  ALONE Admitted from facility:   Level of care:   Primary support name:  Kayla Kent Primary support relationship to patient:  CHILD, ADULT Degree of support available:   adequate/strong    CURRENT CONCERNS Current Concerns  Post-Acute Placement   Other Concerns:   none    SOCIAL WORK ASSESSMENT / PLAN CSW assessed pt at bedside.  Patient is alert and oriented. Patient reports being from home alone.  Patient is a widow (husband died approx 7 years ago after complications from stroke).  Patient states she fell after blowing leaves from her yard.  Patient reports having supporive children. Daughter Kayla Kent lives locally and son lives in [place].  Patient's husband was SNF/LTC at [facility] and patient requests to go there for STR. Patient wishes to have a private room.  Patient is a part of the bundle program with ortho office.  Patient has spoken with the [facility] for ortho MD and requests to have [facility] as a second choice.  Per [facility] and admission's coordinator at [facility], a private room is available.  Patient now wishes for [facility] to be her first choice.  Patient has completed admission's paperwork with Hoyle Sauer at [facility] (bedside) and projected to be medically discharged on Saturday.  CSW will inform weekend CSW and request assistance with discharge.   Assessment/plan status:  Psychosocial Support/Ongoing Assessment of Needs Other assessment/ plan:   FL2  PASARR   Information/referral to community resources:    SNF/STR    PATIENT'S/FAMILY'S RESPONSE TO PLAN OF CARE: Patient is agreeable to SNF search for [facility]. Patient first choice is now [facility]. Admission's paperwork has been completed.  Projected dc: Saturday via [facility].       Nonnie Done, [place] [phone]  Psychiatric & Orthopedics (5N 1-16) Clinical Social Worker

## 2014-08-30 NOTE — Evaluation (Signed)
Occupational Therapy Evaluation Patient Details Name: Kayla Kent MRN: [id number] DOB: 1931-10-21 Today's Date: 08/30/2014    History of Present Illness s/p THA anterior approach; right femoral neck fracture/distal radial fracture resulting from fall.    Clinical Impression   Pt admitted with the above diagnoses and presents with below problem list. Pt will benefit from continued acute OT to address the below listed deficits and maximize independence with basic ADLs prior to d/c to next venue. PTA pt was indpenedent with ADLs and lived alone. Pt currently at mod A for supine>sit transfers and min A for most ADLs.      Follow Up Recommendations  SNF    Equipment Recommendations  Other (comment) (defer to next venue)    Recommendations for Other Services       Precautions / Restrictions Precautions Required Braces or Orthoses: Sling Restrictions Weight Bearing Restrictions: Yes RUE Weight Bearing:  (PFWB only) RLE Weight Bearing: Weight bearing as tolerated      Mobility Bed Mobility Overal bed mobility: Needs Assistance Bed Mobility: Supine to Sit     Supine to sit: Mod assist;HOB elevated     General bed mobility comments: mod A to power up trunk. cues for technique with WB precautions.  Transfers Overall transfer level: Needs assistance Equipment used: Left platform walker Transfers: Sit to/from Stand Sit to Stand: Min guard;Min assist         General transfer comment: cues for technique    Balance Overall balance assessment: Needs assistance         Standing balance support: Bilateral upper extremity supported;During functional activity Standing balance-Leahy Scale: Fair                              ADL Overall ADL's : Needs assistance/impaired Eating/Feeding: Set up;Sitting   Grooming: Set up;Sitting;Standing   Upper Body Bathing: Minimal assitance;Sitting   Lower Body Bathing: Minimal assistance;Sit to/from stand   Upper  Body Dressing : Minimal assistance;Sitting   Lower Body Dressing: Minimal assistance;Sit to/from stand   Toilet Transfer: Ambulation;Minimal assistance (platform rw)   Toileting- Clothing Manipulation and Hygiene: Minimal assistance;Sit to/from stand   Tub/ Shower Transfer: Minimal assistance;Ambulation;3 in 1 (platform rw)   Functional mobility during ADLs: Minimal assistance (platform rw) General ADL Comments: Min A for most ADLs. ADL educaiton provided.     Vision                     Perception     Praxis      Pertinent Vitals/Pain Pain Assessment: No/denies pain     Hand Dominance     Extremity/Trunk Assessment Upper Extremity Assessment Upper Extremity Assessment: RUE deficits/detail RUE Deficits / Details: right femoral neck fracture/distal radial fracture    Lower Extremity Assessment Lower Extremity Assessment: Defer to PT evaluation       Communication Communication Communication: No difficulties   Cognition Arousal/Alertness: Awake/alert Behavior During Therapy: WFL for tasks assessed/performed;Impulsive Overall Cognitive Status: Within Functional Limits for tasks assessed                     General Comments       Exercises       Shoulder Instructions      Home Living Family/patient expects to be discharged to:: Skilled nursing facility Living Arrangements: Alone  Prior Functioning/Environment Level of Independence: Independent             OT Diagnosis: Acute pain   OT Problem List: Impaired balance (sitting and/or standing);Decreased knowledge of use of DME or AE;Decreased safety awareness;Decreased knowledge of precautions;Pain;Impaired UE functional use   OT Treatment/Interventions: Self-care/ADL training;DME and/or AE instruction;Therapeutic activities;Patient/family education;Balance training    OT Goals(Current goals can be found in the care plan section) Acute  Rehab OT Goals OT Goal Formulation: With patient Time For Goal Achievement: 09/06/14 Potential to Achieve Goals: Good ADL Goals Pt Will Perform Upper Body Dressing: with supervision;sitting Pt Will Perform Lower Body Dressing: with supervision;with adaptive equipment;sit to/from stand Pt Will Transfer to Toilet: with supervision;ambulating;bedside commode Pt Will Perform Toileting - Clothing Manipulation and hygiene: with supervision;sit to/from stand Additional ADL Goal #1: Pt will complete supine<>sit at supervision level to prepare for OOB aDLS.  OT Frequency: Min 2X/week   Barriers to D/C: Decreased caregiver support  lives alone       Co-evaluation PT/OT/SLP Co-Evaluation/Treatment: Yes Reason for Co-Treatment: For patient/therapist safety   OT goals addressed during session: ADL's and self-care      End of Session Equipment Utilized During Treatment: Gait belt;Other (comment) (platform rw)  Activity Tolerance: Patient tolerated treatment well Patient left: in chair;with call bell/phone within reach   Time: 1215-1238 OT Time Calculation (min): 23 min Charges:  OT General Charges $OT Visit: 1 Procedure OT Evaluation $Initial OT Evaluation Tier I: 1 Procedure G-Codes:    Kayla Kent 09-11-2014, 2:36 PM

## 2014-08-30 NOTE — Progress Notes (Signed)
   Subjective:  Patient reports pain as mild.  No events.  Objective:   VITALS:   Filed Vitals:   08/29/14 2245 08/29/14 2308 08/30/14 0132 08/30/14 0536  BP: 129/45 128/50 131/49 126/46  Pulse: 66 64 84 82  Temp: 98 F (36.7 C) 97.9 F (36.6 C) 98.9 F (37.2 C) 97.7 F (36.5 C)  TempSrc:      Resp: 16 16 16 16   Height:      Weight:      SpO2: 92% 90% 90% 90%    Neurologically intact Neurovascular intact Sensation intact distally Intact pulses distally Dorsiflexion/Plantar flexion intact Incision: dressing C/D/I and no drainage No cellulitis present Compartment soft   Lab Results  Component Value Date   WBC 7.5 08/30/2014   HGB 10.7* 08/30/2014   HCT 33.2* 08/30/2014   MCV 88.8 08/30/2014   PLT 121* 08/30/2014     Assessment/Plan:  1 Day Post-Op   - Expected postop acute blood loss anemia - will monitor for symptoms - Up with PT/OT - DVT ppx - SCDs, ambulation, lovenox - WBAT RLE, no hip precautions - PFWB RUE - Pain control - Discharge planning  Marianna Payment 08/30/2014, 8:27 AM [phone]

## 2014-08-30 NOTE — Clinical Social Work Psychosocial (Addendum)
Clinical Social Work Department CLINICAL SOCIAL WORK PLACEMENT NOTE 08/30/2014  Patient:  Kayla Kent, Kayla Kent  Account Number:  [id number] Admit date:  08/28/2014  Clinical Social Worker:  Wylene Men  Date/time:  08/30/2014 12:37 PM  Clinical Social Work is seeking post-discharge placement for this patient at the following level of care:   [place]   (*CSW will update this form in Epic as items are completed)   08/30/2014  Patient/family provided with [facility] [place] Department of Clinical Social Work's list of facilities offering this level of care within the geographic area requested by the patient (or if unable, by the patient's family).  08/30/2014  Patient/family informed of their freedom to choose among providers that offer the needed level of care, that participate in Medicare, Medicaid or managed care program needed by the patient, have an available bed and are willing to accept the patient.  08/30/2014  Patient/family informed of [facility]' ownership interest in [facility], as well as of the fact that they are under no obligation to receive care at this facility.  PASARR submitted to EDS on 08/30/2014 PASARR number received on 08/30/2014  FL2 transmitted to all facilities in geographic area requested by pt/family on  08/30/2014 FL2 transmitted to all facilities within larger geographic area on   Patient informed that his/her managed care company has contracts with or will negotiate with  certain facilities, including the following:     Patient/family informed of bed offers received:  08/30/2014 Patient chooses bed at [facility] Physician recommends and patient chooses bed at    Patient to be transferred to [facility] on  08/31/2014 Patient to be transferred to facility by [facility] Patient and family notified of transfer on 08/30/2014 Name of family member notified:  [name] daughter and patient  The following physician request were entered in  Epic:   Additional Comments: patient to dc to [facility] on Saturday phone: [phone] fax: [phone] via: Cyndi Lennert, [place] [phone]  Psychiatric & Orthopedics (5N 1-16) Clinical Social Worker

## 2014-08-30 NOTE — Plan of Care (Signed)
Problem: Acute Rehab PT Goals(only PT should resolve) Goal: Pt Will Ambulate R PFRW

## 2014-08-30 NOTE — Evaluation (Signed)
Physical Therapy Evaluation Patient Details Name: Kayla Kent MRN: [id number] DOB: [birth year] Today's Date: 08/30/2014   History of Present Illness  Pt. fell resulting in R hip fx., s/p THA anterior with no precautions; fx. R radius s/p ORIF.  PMH anxiety, HTN, paryoxysmal supraventricular tachycardia, MVP and syncope  Clinical Impression   This pt. underwent a  Right anterior THA due to hip fx and an ORIF of right radius due to fx from fall.   Pt.  presents to PT with anticipated post-op decrease in strength as well as decreased functional mobility and gait.  She has some decreased safety awareness and impulsivity but tolerated first time up with PT/OT well  Pt. Will benefit from acute PT To address these and below issues.        Follow Up Recommendations SNF;Supervision/Assistance - 24 hour    Equipment Recommendations  Rolling walker with 5" wheels;Other (comment) (R PFRW)    Recommendations for Other Services       Precautions / Restrictions Precautions Precautions: Fall Required Braces or Orthoses: Sling Restrictions Weight Bearing Restrictions: Yes RUE Weight Bearing:  (PFWB) RLE Weight Bearing: Weight bearing as tolerated      Mobility  Bed Mobility Overal bed mobility: Needs Assistance Bed Mobility: Supine to Sit     Supine to sit: Mod assist;HOB elevated     General bed mobility comments: mod assist at trunk/shoulders to elevate to sitting position; needed vc's for avoiding weight on R UE as she transitioned into sitting position  Transfers Overall transfer level: Needs assistance Equipment used: Right platform walker Transfers: Sit to/from Stand Sit to Stand: Min assist         General transfer comment: min assist to safely rise and cues for pushing with left hand while avoiding push off with right hand  Ambulation/Gait Ambulation/Gait assistance: Min assist;+2 safety/equipment Ambulation Distance (Feet): 12 Feet Assistive device: Right platform  walker Gait Pattern/deviations: Step-to pattern;Decreased step length - right;Decreased step length - left Gait velocity: overall decreased, pt. impulsive at times with increased velocity   General Gait Details: Pt. needed safety and technique cues for use of PFRW, placement of R UE into platform.  Pt. at times impulsive and needed safety/speed cueing  Stairs            Wheelchair Mobility    Modified Rankin (Stroke Patients Only)       Balance Overall balance assessment: Needs assistance   Sitting balance-Leahy Scale: Good     Standing balance support: Bilateral upper extremity supported;During functional activity Standing balance-Leahy Scale: Poor Standing balance comment: needed support from [place]                             Pertinent Vitals/Pain Pain Assessment: No/denies pain    Home Living Family/patient expects to be discharged to:: Skilled nursing facility Living Arrangements: Alone                    Prior Function Level of Independence: Independent               Hand Dominance   Dominant Hand: Right    Extremity/Trunk Assessment   Upper Extremity Assessment: Defer to OT evaluation RUE Deficits / Details: right femoral neck fracture/distal radial fracture          Lower Extremity Assessment: RLE deficits/detail RLE Deficits / Details: s/p anterior THA with fair quad set and good ankle pump    Cervical /  Trunk Assessment: Normal  Communication   Communication: No difficulties  Cognition Arousal/Alertness: Awake/alert Behavior During Therapy: WFL for tasks assessed/performed;Impulsive Overall Cognitive Status: Within Functional Limits for tasks assessed                      General Comments General comments (skin integrity, edema, etc.): Pt noted to be slightly impulsive and needed cueing not to WB through RUE during transfers and bed mobility; decreased safety awareness.     Exercises         Assessment/Plan    PT Assessment Patient needs continued PT services  PT Diagnosis Difficulty walking;Acute pain   PT Problem List Decreased activity tolerance;Decreased balance;Decreased mobility;Decreased knowledge of use of DME;Decreased safety awareness;Decreased knowledge of precautions;Pain  PT Treatment Interventions DME instruction;Gait training;Functional mobility training;Therapeutic activities;Therapeutic exercise;Balance training;Patient/family education   PT Goals (Current goals can be found in the Care Plan section) Acute Rehab PT Goals Patient Stated Goal: rehab then home, resume social life PT Goal Formulation: With patient Time For Goal Achievement: 09/06/14 Potential to Achieve Goals: Good    Frequency 7X/week   Barriers to discharge Decreased caregiver support lived alone PTA    Co-evaluation   Reason for Co-Treatment: For patient/therapist safety   OT goals addressed during session: ADL's and self-care       End of Session Equipment Utilized During Treatment: Gait belt Activity Tolerance: Patient tolerated treatment well Patient left: in chair;with call bell/phone within reach Nurse Communication: Mobility status         Time: 1207-1238 PT Time Calculation (min) (ACUTE ONLY): 31 min   Charges:   PT Evaluation $Initial PT Evaluation Tier I: 1 Procedure PT Treatments $Gait Training: 8-22 mins   PT G CodesLadona Ridgel 08/30/2014, 5:07 PM Gerlean Ren PT Acute Rehab Services [place] [phone]

## 2014-08-30 NOTE — Plan of Care (Signed)
Problem: Phase III Progression Outcomes Goal: Pain controlled on oral analgesia Outcome: Completed/Met Date Met:  08/30/14

## 2014-08-30 NOTE — Progress Notes (Signed)
NUTRITION FOLLOW UP  INTERVENTION: Provide nourishment snacks (fruit cups). Ordered RD to follow for nutrition care plan  NUTRITION DIAGNOSIS: Increased nutrient needs related to hip fracture, post-op healing as evidenced by estimated nutrition needs; ongoing  Goal: Pt to meet >/= 90% of their estimated nutrition needs; progressing  Monitor:  PO intake, weight, labs, I/O's  80 y.o. female  Admitting Dx: Fracture of femoral neck, right  ASSESSMENT: 80 y.o. Female quite healthy at baseline, was doing yard work when she suffered a mechanical fall.  Operative Procedures (11/12) Prosthetic replacement of right femoral neck fracture.   Pt reports her appetite has been improving. Meal completion this AM is 40%. Pt refused oral supplements, but is agreeable on nourishment snacks. Will order. Pt was encouraged to eat her food at meals.   PTA pt reports eating well with no difficulties. Pt with no observed significant fat or muscle mass loss.  Height: Ht Readings from Last 1 Encounters:  08/28/14 5\' 6"  (1.676 m)    Weight: Wt Readings from Last 1 Encounters:  08/28/14 118 lb (53.524 kg)    BMI:  Body mass index is 19.05 kg/(m^2).  Re-Estimated Nutritional Needs: Kcal: 1600-1800 Protein: 70-80 gm Fluid: >/= 1.5 L  Skin: Incision right hip and right arm, non-pitting RUE edema  Diet Order: Diet regular   Intake/Output Summary (Last 24 hours) at 08/30/14 0949 Last data filed at 08/30/14 6010  Gross per 24 hour  Intake   1810 ml  Output   1500 ml  Net    310 ml    Labs:   Recent Labs Lab 08/28/14 2015 08/29/14 2350 08/30/14 0430  NA 143  --  139  K 3.4*  --  4.9  CL 105  --  106  CO2 24  --  20  BUN 16  --  17  CREATININE 1.00 0.88 0.90  CALCIUM 9.1  --  7.9*  GLUCOSE 127*  --  139*    Scheduled Meds: . atenolol  50 mg Oral Daily  . Chlorhexidine Gluconate Cloth  6 each Topical Daily  . diltiazem  180 mg Oral Daily  . enoxaparin (LOVENOX) injection   40 mg Subcutaneous Q24H  . levETIRAcetam  250 mg Oral BID  . mupirocin ointment  1 application Nasal BID  . rosuvastatin  5 mg Oral QHS  . senna  1 tablet Oral BID  . vancomycin  1,000 mg Intravenous Once    Continuous Infusions: . sodium chloride 125 mL/hr (08/30/14 0155)  . lactated ringers 10 mL/hr at 08/29/14 1720    Past Medical History  Diagnosis Date  . Anxiety   . Paroxysmal supraventricular tachycardia   . Uterine prolapse without mention of vaginal wall prolapse   . HTN (hypertension)   . HLD (hyperlipidemia)   . Intolerance of drug     orthostatic  . PVD (peripheral vascular disease)   . Allergic rhinitis   . Syncope and collapse     Past Surgical History  Procedure Laterality Date  . Corrective eye surgery      as a child  . Cholecystectomy    . Tonsillectomy    . Ivd removed    . Stress cardiolite  08/05/93  . Rf ablation psvt      summer '10   Kallie Locks, [place], RD, LDN Pager # [phone] After hours/ weekend pager # [phone]

## 2014-08-30 NOTE — Care Management Note (Signed)
CARE MANAGEMENT NOTE 08/30/2014  Patient:  Kayla Kent, Kayla Kent   Account Number:  [id number]  Date Initiated:  08/30/2014  Documentation initiated by:  Ricki Miller  Subjective/Objective Assessment:   81 yr old female admitted s/p fall with right wrist fx and right hip fx. Patient had a right wrist ORIF, righ hip hemiarthroplasty.     Action/Plan:   patient is for shortterm rehab at [facility]. Social Worker is aware. Patient wants [facility] or [facility].    Anticipated DC Date:  08/31/2014   Anticipated DC Plan:  SKILLED NURSING FACILITY  In-house referral  Clinical Social Worker      DC Planning Services  CM consult      [facility] Choice  NA   Choice offered to / List presented to:  NA   DME arranged  NA        HH arranged  NA      Status of service:  Completed, signed off Medicare Important Message given?  YES (If response is "NO", the following Medicare IM given date fields will be blank) Date Medicare IM given:  08/30/2014 Medicare IM given by:  Ricki Miller Date Additional Medicare IM given:   Additional Medicare IM given by:    Discharge Disposition:  [place]  Per UR Regulation:  Reviewed for med. necessity/level of care/duration of stay

## 2014-08-30 NOTE — Progress Notes (Signed)
Physical Therapy Treatment Patient Details Name: Kayla Kent MRN: [id number] DOB: [birth year] Today's Date: 08/30/2014    History of Present Illness Pt. fell resulting in R hip fx., s/p THA anterior with no precautions; fx. R radius s/p ORIF.  PMH anxiety, HTN, paryoxysmal supraventricular tachycardia, MVP and syncope    PT Comments    Good progress this afternoon with gait training.  Still needs safety cues and monitoring due to some impulsivity and decreased safety awareness.    Follow Up Recommendations  SNF;Supervision/Assistance - 24 hour     Equipment Recommendations  Rolling walker with 5" wheels;Other (comment)    Recommendations for Other Services       Precautions / Restrictions Precautions Precautions: Fall Precaution Comments: monitor impulsivity Required Braces or Orthoses: Sling (to be used if not using PFRW) Restrictions Weight Bearing Restrictions: Yes RUE Weight Bearing: Weight bearing as tolerated (R PFWB) RLE Weight Bearing: Weight bearing as tolerated    Mobility  Bed Mobility Overal bed mobility: Needs Assistance Bed Mobility: Sit to Supine     Supine to sit: Mod assist;HOB elevated Sit to supine: +2 for physical assistance;Min assist   General bed mobility comments: +2 min assist at upper body and legs for lying back down  Transfers Overall transfer level: Needs assistance Equipment used: Right platform walker Transfers: Sit to/from Stand Sit to Stand: Min assist         General transfer comment: min assist to rise to stand and to descend to bed with cues for hand placement  Ambulation/Gait Ambulation/Gait assistance: Min assist;+2 safety/equipment Ambulation Distance (Feet): 60 Feet Assistive device: Right platform walker Gait Pattern/deviations: Step-to pattern Gait velocity: overall decreased, pt. impulsive at times with increased velocity   General Gait Details: Pt. needed safety and technique cues for use of PFRW, placement of  R UE into platform.  Pt. at times impulsive and needed safety/speed cueing   Stairs            Wheelchair Mobility    Modified Rankin (Stroke Patients Only)       Balance Overall balance assessment: Needs assistance   Sitting balance-Leahy Scale: Good     Standing balance support: Bilateral upper extremity supported;During functional activity Standing balance-Leahy Scale: Poor Standing balance comment: needed support from [place]                    Cognition Arousal/Alertness: Awake/alert Behavior During Therapy: WFL for tasks assessed/performed;Impulsive Overall Cognitive Status: Within Functional Limits for tasks assessed                      Exercises Total Joint Exercises Ankle Circles/Pumps: AROM;Both;10 reps Quad Sets: AROM;Both;10 reps Long Arc Quad: AROM;Right;10 reps    General Comments General comments (skin integrity, edema, etc.): Pt noted to be slightly impulsive and needed cueing not to WB through RUE during transfers and bed mobility; decreased safety awareness.       Pertinent Vitals/Pain Pain Assessment: 0-10 Pain Score: 3  Pain Location: right groin Pain Descriptors / Indicators: Aching;Sore Pain Intervention(s): Limited activity within patient's tolerance;Monitored during session;Repositioned    Home Living Family/patient expects to be discharged to:: Skilled nursing facility Living Arrangements: Alone                  Prior Function Level of Independence: Independent          PT Goals (current goals can now be found in the care plan section) Acute Rehab PT Goals Patient  Stated Goal: rehab then home, resume social life PT Goal Formulation: With patient Time For Goal Achievement: 09/06/14 Potential to Achieve Goals: Good Progress towards PT goals: Progressing toward goals    Frequency  7X/week    PT Plan Current plan remains appropriate    Co-evaluation PT/OT/SLP Co-Evaluation/Treatment: Yes Reason for  Co-Treatment: For patient/therapist safety PT goals addressed during session: Mobility/safety with mobility;Balance;Proper use of DME OT goals addressed during session: ADL's and self-care     End of Session Equipment Utilized During Treatment: Gait belt Activity Tolerance: Patient tolerated treatment well Patient left: in bed;with call bell/phone within reach     Time: 1459-1522 PT Time Calculation (min) (ACUTE ONLY): 23 min  Charges:  $Gait Training: 8-22 mins $Therapeutic Exercise: 8-22 mins                    G Codes:      Ladona Ridgel 08/30/2014, 5:20 PM Gerlean Ren PT Acute Rehab Services [place] [phone]

## 2014-08-30 NOTE — Plan of Care (Signed)
Problem: Phase I Progression Outcomes Goal: Post op pain controlled with appropriate interventions Outcome: Completed/Met Date Met:  08/30/14 Goal: Post op hemodynamically stable Outcome: Completed/Met Date Met:  08/30/14 Goal: Post op CMS/Neurovascular status WDL Outcome: Completed/Met Date Met:  08/30/14 Goal: Post op clear liquids, advance diet as tolerated Outcome: Completed/Met Date Met:  08/30/14 Goal: Other Phase I Outcomes/Goals Outcome: Not Applicable Date Met:  59/13/68  Problem: Phase II Progression Outcomes Goal: CMS/Neurovascular status WDL Outcome: Completed/Met Date Met:  08/30/14 Goal: Tolerating diet Outcome: Completed/Met Date Met:  08/30/14 Goal: Discharge plan established Outcome: Completed/Met Date Met:  08/30/14 Goal: Other Phase II Outcomes/Goals Outcome: Not Applicable Date Met:  59/92/34

## 2014-08-30 NOTE — Progress Notes (Signed)
[facility] PROGRESS NOTE  Kayla Kent QPY:[id number] DOB: [birth year] DOA: 08/28/2014 PCP: Cathlean Cower, MD  Assessment/Plan: 1. Right femoral neck fracture/distal radial fracture -Status post mechanical fall at home, imaging studies showing right femoral neck fracture with varus angulation. Wrist x-ray showed comminuted displaced posteriorly angulated fracture of the distal radius with extension to the radiocarpal joint space. -patient was taken to the OR on 08/29/2014 where she underwent prostatic replacement of right femoral neck fracture an open reduction internal fixation of right distal radial fracture. -patient tolerated procedure well there are no me to complications.  2.  Hypertension -Patient's systolic blood pressures in the 150s to 160s, could be pain driven. -She is on atenolol 50 mg by mouth daily and diltiazem 180 mg by mouth daily -blood pressures improving bilateral 13 1245 with systolic blood pressures in the 120s to 130s.  3.  Dyslipidemia. -Continue Crestor  Code Status: full code Family Communication:  Disposition Plan: plan to discharge he is skilled nursing facility in the next 24 hours.   Consultants:  Orthopedic surgery  Procedures:  Plan for ORIF   HPI/Subjective: Patient is a pleasant 80 year old female with a past medical history of paroxysmal supraventricular tachycardia, hypertension, dyslipidemia, admitted to the medicine service on 08/28/2014 when she presented after having a fall at home. She reported losing her balance and fallen on concrete. Imaging studies revealed a right femoral neck fracture with varus angulation. Orthopedic surgery was consulted.   Objective: Filed Vitals:   08/30/14 0800  BP:   Pulse:   Temp:   Resp: 16    Intake/Output Summary (Last 24 hours) at 08/30/14 1139 Last data filed at 08/30/14 8099  Gross per 24 hour  Intake   1810 ml  Output   1500 ml  Net    310 ml   Filed Weights   08/28/14 2325   Weight: 53.524 kg (118 lb)    Exam:   General:  Patient is awake, alert, following commands.  Cardiovascular: regular rate and rhythm normal S1-S2 no murmurs rubs or gallops  Respiratory: clear to auscultation bilaterally  Abdomen: soft nontender nondistended  Musculoskeletal: no edema  Data Reviewed: Basic Metabolic Panel:  Recent Labs Lab 08/28/14 2015 08/29/14 2350 08/30/14 0430  NA 143  --  139  K 3.4*  --  4.9  CL 105  --  106  CO2 24  --  20  GLUCOSE 127*  --  139*  BUN 16  --  17  CREATININE 1.00 0.88 0.90  CALCIUM 9.1  --  7.9*   Liver Function Tests: No results for input(s): AST, ALT, ALKPHOS, BILITOT, PROT, ALBUMIN in the last 168 hours. No results for input(s): LIPASE, AMYLASE in the last 168 hours. No results for input(s): AMMONIA in the last 168 hours. CBC:  Recent Labs Lab 08/28/14 2015 08/29/14 2350 08/30/14 0430  WBC 13.8* 10.5 7.5  NEUTROABS 12.0*  --   --   HGB 12.4 12.0 10.7*  HCT 37.8 35.6* 33.2*  MCV 88.3 87.7 88.8  PLT 173 134* 121*   Cardiac Enzymes: No results for input(s): CKTOTAL, CKMB, CKMBINDEX, TROPONINI in the last 168 hours. BNP (last 3 results) No results for input(s): PROBNP in the last 8760 hours. CBG: No results for input(s): GLUCAP in the last 168 hours.  Recent Results (from the past 240 hour(s))  Surgical pcr screen     Status: Abnormal   Collection Time: 08/29/14 12:21 AM  Result Value Ref Range Status   MRSA, PCR  NEGATIVE NEGATIVE Final   Staphylococcus aureus POSITIVE (A) NEGATIVE Final    Comment:        The Xpert SA Assay (FDA approved for NASAL specimens in patients over 27 years of age), is one component of a comprehensive surveillance program.  Test performance has been validated by [employer] for patients greater than or equal to 64 year old. It is not intended to diagnose infection nor to guide or monitor treatment. RESULT CALLED TO, READ BACK BY AND VERIFIED WITH: Gailen Shelter RN [phone]  GREEN R      Studies: Dg Wrist Complete Right  08/28/2014   CLINICAL DATA:  Fall while walking this afternoon. Initial evaluation.  EXAM: RIGHT WRIST - COMPLETE 3+ VIEW  COMPARISON:  None.  FINDINGS: Comminuted posterior angulated displaced distal right radial fracture with extension to the radiocarpal joint space noted. Displaced ulnar styloid fracture.  IMPRESSION: Comminuted displaced posteriorly angulated fracture of the distal radius with extension to the radiocarpal joint space. Displaced ulnar styloid fracture.   Electronically Signed   By: Marcello Moores  Register   On: 08/28/2014 18:48   Dg Hip Complete Right  08/28/2014   CLINICAL DATA:  Fall wall wall and this afternoon. Right lateral hip pain. Initial encounter.  EXAM: RIGHT HIP - COMPLETE 2+ VIEW  COMPARISON:  None.  FINDINGS: There is a right femoral neck fracture with varus angulation. No subluxation or dislocation. Left hip and SI joints are unremarkable.  IMPRESSION: Right femoral neck fracture with varus angulation.   Electronically Signed   By: Rolm Baptise M.D.   On: 08/28/2014 18:48   Dg Hip Operative Right  08/29/2014   CLINICAL DATA:  Right femoral neck fracture. Arthroplasty. Initial encounter.  EXAM: DG OPERATIVE RIGHT HIP 1-2 VIEWS  TECHNIQUE: Fluoroscopic spot image(s) were submitted for interpretation post-operatively.  COMPARISON:  Radiographs 08/28/2014.  FINDINGS: Two spot fluoroscopic images of the right hip demonstrate interval resection of the right femoral head and bipolar hemiarthroplasty. The hardware appears well positioned. There is no evidence of acute fracture or dislocation. There is gas within the operative bed.  IMPRESSION: Intraoperative views following right hip hemiarthroplasty. No demonstrated complication.   Electronically Signed   By: Camie Patience M.D.   On: 08/29/2014 20:12   Pelvis Portable  08/29/2014   CLINICAL DATA:  Status post right hip replacement after intertrochanteric fracture.  EXAM: PORTABLE  PELVIS 1-2 VIEWS  COMPARISON:  Plain films of right hip 08/28/2014.  FINDINGS: Bipolar right hip hemiarthroplasty is in place. The device is located. No fracture is seen. Surgical staples and gas in the soft tissues are noted.  IMPRESSION: Right hip replacement without evidence of complication.   Electronically Signed   By: Inge Rise M.D.   On: 08/29/2014 23:40   Dg Chest Port 1 View  08/28/2014   CLINICAL DATA:  Preop evaluation following hip fracture, hypertension  EXAM: PORTABLE CHEST - 1 VIEW  COMPARISON:  07/16/2009  FINDINGS: Cardiac shadow is stable. The lungs are again hyperinflated. Mild increase in central vascular congestion is noted. Mild scarring is noted in the left lung base. No focal confluent infiltrate is seen.  IMPRESSION: Mild vascular congestion.  No other acute abnormality is noted   Electronically Signed   By: Inez Catalina M.D.   On: 08/28/2014 19:21   Dg Femur Right Port  08/28/2014   CLINICAL DATA:  Fall with right hip pain.  EXAM: PORTABLE RIGHT FEMUR - 2 VIEW  COMPARISON:  Right hip same day.  FINDINGS: Images of the right femur excluding the right hip demonstrate no additional fracture sites. Remainder the exam is unremarkable.  IMPRESSION: No acute findings.   Electronically Signed   By: Marin Olp M.D.   On: 08/28/2014 20:37    Scheduled Meds: . atenolol  50 mg Oral Daily  . Chlorhexidine Gluconate Cloth  6 each Topical Daily  . diltiazem  180 mg Oral Daily  . enoxaparin (LOVENOX) injection  40 mg Subcutaneous Q24H  . levETIRAcetam  250 mg Oral BID  . mupirocin ointment  1 application Nasal BID  . rosuvastatin  5 mg Oral QHS  . senna  1 tablet Oral BID  . vancomycin  1,000 mg Intravenous Once   Continuous Infusions: . sodium chloride 125 mL/hr (08/30/14 0155)  . lactated ringers 10 mL/hr at 08/29/14 1720    Principal Problem:   Fracture of femoral neck, right Active Problems:   HLD (hyperlipidemia)   Essential hypertension   Chronic diastolic  CHF (congestive heart failure)   Seizures   Distal radius fracture, right   Hip fracture requiring operative repair    Time spent: 25 min    Kelvin Cellar  [facility] Pager [phone]. If 7PM-7AM, please contact night-coverage at [email or web address], password [facility] 08/30/2014, 11:39 AM  LOS: 2 days

## 2014-08-31 DIAGNOSIS — S72001S Fracture of unspecified part of neck of right femur, sequela: Secondary | ICD-10-CM | POA: Diagnosis not present

## 2014-08-31 DIAGNOSIS — Z9181 History of falling: Secondary | ICD-10-CM | POA: Diagnosis not present

## 2014-08-31 DIAGNOSIS — W19XXXA Unspecified fall, initial encounter: Secondary | ICD-10-CM | POA: Insufficient documentation

## 2014-08-31 DIAGNOSIS — I5032 Chronic diastolic (congestive) heart failure: Secondary | ICD-10-CM | POA: Diagnosis not present

## 2014-08-31 DIAGNOSIS — R2681 Unsteadiness on feet: Secondary | ICD-10-CM | POA: Diagnosis not present

## 2014-08-31 DIAGNOSIS — S52501D Unspecified fracture of the lower end of right radius, subsequent encounter for closed fracture with routine healing: Secondary | ICD-10-CM | POA: Diagnosis not present

## 2014-08-31 DIAGNOSIS — Z471 Aftercare following joint replacement surgery: Secondary | ICD-10-CM | POA: Diagnosis not present

## 2014-08-31 DIAGNOSIS — S72001A Fracture of unspecified part of neck of right femur, initial encounter for closed fracture: Secondary | ICD-10-CM | POA: Diagnosis not present

## 2014-08-31 DIAGNOSIS — E785 Hyperlipidemia, unspecified: Secondary | ICD-10-CM | POA: Diagnosis not present

## 2014-08-31 DIAGNOSIS — G4700 Insomnia, unspecified: Secondary | ICD-10-CM | POA: Diagnosis not present

## 2014-08-31 DIAGNOSIS — F419 Anxiety disorder, unspecified: Secondary | ICD-10-CM | POA: Diagnosis not present

## 2014-08-31 DIAGNOSIS — I10 Essential (primary) hypertension: Secondary | ICD-10-CM | POA: Diagnosis not present

## 2014-08-31 DIAGNOSIS — R279 Unspecified lack of coordination: Secondary | ICD-10-CM | POA: Diagnosis not present

## 2014-08-31 DIAGNOSIS — S52501A Unspecified fracture of the lower end of right radius, initial encounter for closed fracture: Secondary | ICD-10-CM | POA: Diagnosis not present

## 2014-08-31 DIAGNOSIS — S52501S Unspecified fracture of the lower end of right radius, sequela: Secondary | ICD-10-CM | POA: Diagnosis not present

## 2014-08-31 DIAGNOSIS — R569 Unspecified convulsions: Secondary | ICD-10-CM | POA: Diagnosis not present

## 2014-08-31 DIAGNOSIS — K5900 Constipation, unspecified: Secondary | ICD-10-CM | POA: Diagnosis not present

## 2014-08-31 DIAGNOSIS — Z96641 Presence of right artificial hip joint: Secondary | ICD-10-CM | POA: Diagnosis not present

## 2014-08-31 LAB — BASIC METABOLIC PANEL
ANION GAP: 14 (ref 5–15)
BUN: 22 mg/dL (ref 6–23)
CALCIUM: 8.1 mg/dL — AB (ref 8.4–10.5)
CO2: 20 mEq/L (ref 19–32)
CREATININE: 0.96 mg/dL (ref 0.50–1.10)
Chloride: 107 mEq/L (ref 96–112)
GFR calc non Af Amer: 54 mL/min — ABNORMAL LOW (ref >90)
GFR, EST AFRICAN AMERICAN: 63 mL/min — AB (ref >90)
Glucose, Bld: 94 mg/dL (ref 70–99)
Potassium: 5 mEq/L (ref 3.7–5.3)
Sodium: 141 mEq/L (ref 137–147)

## 2014-08-31 LAB — CBC
HCT: 31 % — ABNORMAL LOW (ref 36.0–46.0)
Hemoglobin: 10.2 g/dL — ABNORMAL LOW (ref 12.0–15.0)
MCH: 29.9 pg (ref 26.0–34.0)
MCHC: 32.9 g/dL (ref 30.0–36.0)
MCV: 90.9 fL (ref 78.0–100.0)
PLATELETS: 130 10*3/uL — AB (ref 150–400)
RBC: 3.41 MIL/uL — ABNORMAL LOW (ref 3.87–5.11)
RDW: 14.5 % (ref 11.5–15.5)
WBC: 9 10*3/uL (ref 4.0–10.5)

## 2014-08-31 NOTE — Plan of Care (Signed)
Problem: Phase II Progression Outcomes Goal: Bed to chair Outcome: Completed/Met Date Met:  08/31/14  Problem: Phase III Progression Outcomes Goal: Activity at appropriate level-compared to baseline (UP IN CHAIR FOR HEMODIALYSIS)  Outcome: Completed/Met Date Met:  08/31/14 Goal: Incision clean - minimal/no drainage Outcome: Completed/Met Date Met:  08/31/14 Goal: Ambulate BID with assist as able Outcome: Completed/Met Date Met:  08/31/14 Goal: Discharge plan remains appropriate-arrangements made Outcome: Completed/Met Date Met:  08/31/14 Goal: Anticoagulant follow-up in place Outcome: Completed/Met Date Met:  08/31/14 Goal: Other Phase III Outcomes/Goals Outcome: Not Applicable Date Met:  61/84/85

## 2014-08-31 NOTE — Progress Notes (Signed)
Report called to [facility], nurse at [facility]. Discharge instructions reviewed with patient. Patient ready for discharge and waiting on patient transportation.

## 2014-08-31 NOTE — Progress Notes (Signed)
Subjective: 2 Days Post-Op Procedure(s) (LRB): Right Hip Hemi Arthroplasty (Right) OPEN REDUCTION INTERNAL FIXATION (ORIF) DISTAL RADIAL FRACTURE (Right) Patient reports pain as 4 on 0-10 scale.    Objective: Vital signs in last 24 hours: Temp:  [97.8 F (36.6 C)-98.6 F (37 C)] 97.8 F (36.6 C) (11/14 0652) Pulse Rate:  [82-85] 82 (11/14 0652) Resp:  [14-16] 14 (11/14 0800) BP: (124-126)/(55-56) 124/56 mmHg (11/14 0652) SpO2:  [91 %-94 %] 93 % (11/14 0652)  Intake/Output from previous day: 11/13 0701 - 11/14 0700 In: 720 [P.O.:720] Out: -  Intake/Output this shift:     Recent Labs  08/28/14 2015 08/29/14 2350 08/30/14 0430 08/31/14 0620  HGB 12.4 12.0 10.7* 10.2*    Recent Labs  08/30/14 0430 08/31/14 0620  WBC 7.5 9.0  RBC 3.74* 3.41*  HCT 33.2* 31.0*  PLT 121* 130*    Recent Labs  08/30/14 0430 08/31/14 0620  NA 139 141  K 4.9 5.0  CL 106 107  CO2 20 20  BUN 17 22  CREATININE 0.90 0.96  GLUCOSE 139* 94  CALCIUM 7.9* 8.1*    Recent Labs  08/28/14 2015  INR 1.09    Neurologically intact  Assessment/Plan: 2 Days Post-Op Procedure(s) (LRB): Right Hip Hemi Arthroplasty (Right) OPEN REDUCTION INTERNAL FIXATION (ORIF) DISTAL RADIAL FRACTURE (Right) Discharge to SNF  Kayla Kent C 08/31/2014, 9:37 AM

## 2014-08-31 NOTE — Clinical Social Work Note (Signed)
Pt to be discharged to [facility]. Pt updated at bedside regarding discharge.  [facility]: [phone] Transportation: EMS [facility] scheduled for 1pm once pt's bed available.  Lubertha Sayres, [place] [phone]) Licensed Clinical Social Worker Orthopedics [phone]) and Surgical [phone])

## 2014-08-31 NOTE — Plan of Care (Signed)
Problem: Discharge Progression Outcomes Goal: Barriers To Progression Addressed/Resolved Outcome: Completed/Met Date Met:  08/31/14 Goal: Pain controlled with appropriate interventions Outcome: Completed/Met Date Met:  08/31/14 Goal: Hemodynamically stable Outcome: Completed/Met Date Met:  20/99/06 Goal: Complications resolved/controlled Outcome: Completed/Met Date Met:  08/31/14 Goal: Tolerates diet Outcome: Completed/Met Date Met:  08/31/14 Goal: CMS/Neurovascular status at or above baseline Outcome: Completed/Met Date Met:  08/31/14 Goal: Incision without S/S infection Outcome: Completed/Met Date Met:  08/31/14 Goal: Ambulates safely using assistive device Outcome: Adequate for Discharge Goal: Follows weight - bearing limitations Outcome: Completed/Met Date Met:  08/31/14 Goal: ADLs with assistance Outcome: Completed/Met Date Met:  08/31/14 Goal: Discharge plan in place and appropriate Outcome: Completed/Met Date Met:  08/31/14 Goal: Activity appropriate for discharge plan Outcome: Completed/Met Date Met:  08/31/14 Goal: Other Discharge Outcomes/Goals Outcome: Not Applicable Date Met:  89/34/06

## 2014-08-31 NOTE — Progress Notes (Signed)
[facility] PROGRESS NOTE  Kayla Kent KZS:[id number] DOB: August 14, 1932 DOA: 08/28/2014 PCP: Cathlean Cower, MD  Assessment/Plan: 1. Right femoral neck fracture/distal radial fracture -Status post mechanical fall at home, imaging studies showing right femoral neck fracture with varus angulation. Wrist x-ray showed comminuted displaced posteriorly angulated fracture of the distal radius with extension to the radiocarpal joint space. -patient was taken to the OR on 08/29/2014 where she underwent prostatic replacement of right femoral neck fracture an open reduction internal fixation of right distal radial fracture. -Doing well will discharge to SNF today  2.  Hypertension -Improved, will discharge on Atenolol 50 mg PO q daily and Cardizem 180 mg PO q daily  3.  Dyslipidemia. -Continue Crestor  Code Status: full code Family Communication:  Disposition Plan: plan to discharge to SNF today   Consultants:  Orthopedic surgery  Procedures:  Plan for ORIF   HPI/Subjective: Patient is a pleasant 80 year old female with a past medical history of paroxysmal supraventricular tachycardia, hypertension, dyslipidemia, admitted to the medicine service on 08/28/2014 when she presented after having a fall at home. She reported losing her balance and fallen on concrete. Imaging studies revealed a right femoral neck fracture with varus angulation. Orthopedic surgery was consulted.   Objective: Filed Vitals:   08/31/14 0800  BP:   Pulse:   Temp:   Resp: 14    Intake/Output Summary (Last 24 hours) at 08/31/14 0901 Last data filed at 08/30/14 1905  Gross per 24 hour  Intake    480 ml  Output      0 ml  Net    480 ml   Filed Weights   08/28/14 2325  Weight: 53.524 kg (118 lb)    Exam:   General:  Patient is awake, alert, following commands.  Cardiovascular: regular rate and rhythm normal S1-S2 no murmurs rubs or gallops  Respiratory: clear to auscultation  bilaterally  Abdomen: soft nontender nondistended  Musculoskeletal: no edema  Data Reviewed: Basic Metabolic Panel:  Recent Labs Lab 08/28/14 2015 08/29/14 2350 08/30/14 0430 08/31/14 0620  NA 143  --  139 141  K 3.4*  --  4.9 5.0  CL 105  --  106 107  CO2 24  --  20 20  GLUCOSE 127*  --  139* 94  BUN 16  --  17 22  CREATININE 1.00 0.88 0.90 0.96  CALCIUM 9.1  --  7.9* 8.1*   Liver Function Tests: No results for input(s): AST, ALT, ALKPHOS, BILITOT, PROT, ALBUMIN in the last 168 hours. No results for input(s): LIPASE, AMYLASE in the last 168 hours. No results for input(s): AMMONIA in the last 168 hours. CBC:  Recent Labs Lab 08/28/14 2015 08/29/14 2350 08/30/14 0430 08/31/14 0620  WBC 13.8* 10.5 7.5 9.0  NEUTROABS 12.0*  --   --   --   HGB 12.4 12.0 10.7* 10.2*  HCT 37.8 35.6* 33.2* 31.0*  MCV 88.3 87.7 88.8 90.9  PLT 173 134* 121* 130*   Cardiac Enzymes: No results for input(s): CKTOTAL, CKMB, CKMBINDEX, TROPONINI in the last 168 hours. BNP (last 3 results) No results for input(s): PROBNP in the last 8760 hours. CBG: No results for input(s): GLUCAP in the last 168 hours.  Recent Results (from the past 240 hour(s))  Surgical pcr screen     Status: Abnormal   Collection Time: 08/29/14 12:21 AM  Result Value Ref Range Status   MRSA, PCR NEGATIVE NEGATIVE Final   Staphylococcus aureus POSITIVE (A) NEGATIVE Final  Comment:        The Xpert SA Assay (FDA approved for NASAL specimens in patients over 109 years of age), is one component of a comprehensive surveillance program.  Test performance has been validated by [employer] for patients greater than or equal to 85 year old. It is not intended to diagnose infection nor to guide or monitor treatment. RESULT CALLED TO, READ BACK BY AND VERIFIED WITH: Gailen Shelter RN [phone] GREEN R      Studies: Dg Hip Operative Right  2014-09-22   CLINICAL DATA:  Right femoral neck fracture. Arthroplasty.  Initial encounter.  EXAM: DG OPERATIVE RIGHT HIP 1-2 VIEWS  TECHNIQUE: Fluoroscopic spot image(s) were submitted for interpretation post-operatively.  COMPARISON:  Radiographs 08/28/2014.  FINDINGS: Two spot fluoroscopic images of the right hip demonstrate interval resection of the right femoral head and bipolar hemiarthroplasty. The hardware appears well positioned. There is no evidence of acute fracture or dislocation. There is gas within the operative bed.  IMPRESSION: Intraoperative views following right hip hemiarthroplasty. No demonstrated complication.   Electronically Signed   By: Camie Patience M.D.   On: 09-22-14 20:12   Pelvis Portable  Sep 22, 2014   CLINICAL DATA:  Status post right hip replacement after intertrochanteric fracture.  EXAM: PORTABLE PELVIS 1-2 VIEWS  COMPARISON:  Plain films of right hip 08/28/2014.  FINDINGS: Bipolar right hip hemiarthroplasty is in place. The device is located. No fracture is seen. Surgical staples and gas in the soft tissues are noted.  IMPRESSION: Right hip replacement without evidence of complication.   Electronically Signed   By: Inge Rise M.D.   On: September 22, 2014 23:40    Scheduled Meds: . atenolol  50 mg Oral Daily  . Chlorhexidine Gluconate Cloth  6 each Topical Daily  . diltiazem  180 mg Oral Daily  . enoxaparin (LOVENOX) injection  40 mg Subcutaneous Q24H  . levETIRAcetam  250 mg Oral BID  . mupirocin ointment  1 application Nasal BID  . rosuvastatin  5 mg Oral QHS  . senna  1 tablet Oral BID   Continuous Infusions: . lactated ringers 10 mL/hr at 09-22-2014 1720    Principal Problem:   Fracture of femoral neck, right Active Problems:   HLD (hyperlipidemia)   Essential hypertension   Chronic diastolic CHF (congestive heart failure)   Seizures   Distal radius fracture, right   Hip fracture requiring operative repair    Time spent:    Kelvin Cellar  [facility] Pager [phone]. If 7PM-7AM, please contact  night-coverage at [email or web address], password [facility] 08/31/2014, 9:01 AM  LOS: 3 days

## 2014-08-31 NOTE — Progress Notes (Signed)
Physical Therapy Treatment Patient Details Name: Kayla Kent MRN: [id number] DOB: 1932-09-07 Today's Date: 08/31/2014    History of Present Illness Pt. fell resulting in R hip fx., s/p THA anterior with no precautions; fx. R radius s/p ORIF.  PMH anxiety, HTN, paryoxysmal supraventricular tachycardia, MVP and syncope    PT Comments    Pt was seen for last visit prior to discharge to SNF.  Her plan is good as she is limited for dynamic stability with fracture on RUE and hip, but also has a tendency not to judge her environment to monitor obstacles that are impendingly on her path.  Safety, lack of assistance and total tolerance for activity make SNF very appropriate option for her.  Follow Up Recommendations  SNF;Supervision/Assistance - 24 hour     Equipment Recommendations  Rolling walker with 5" wheels;Other (comment)    Recommendations for Other Services       Precautions / Restrictions Precautions Precautions: Fall Required Braces or Orthoses: Sling Restrictions Weight Bearing Restrictions: Yes RUE Weight Bearing: Weight bearing as tolerated RLE Weight Bearing: Weight bearing as tolerated Other Position/Activity Restrictions: monitor wb through RUE if off platform    Mobility  Bed Mobility                  Transfers Overall transfer level: Needs assistance Equipment used: Right platform walker Transfers: Sit to/from Stand;Stand Pivot Transfers Sit to Stand: Min assist Stand pivot transfers: Min guard       General transfer comment: min assist to rise to stand and to descend to bed with cues for hand placement  Ambulation/Gait Ambulation/Gait assistance: Min guard Ambulation Distance (Feet): 70 Feet Assistive device: Right platform walker Gait Pattern/deviations: Step-through pattern;Decreased stride length;Antalgic;Drifts right/left;Wide base of support Gait velocity: overall decreased, pt. impulsive at times with increased velocity Gait velocity  interpretation: Below normal speed for age/gender General Gait Details: minor cues for directing walker safely as she does not plan pathway well   Stairs            Wheelchair Mobility    Modified Rankin (Stroke Patients Only)       Balance Overall balance assessment: Needs assistance Sitting-balance support: Feet supported Sitting balance-Leahy Scale: Good   Postural control: Posterior lean Standing balance support: Bilateral upper extremity supported Standing balance-Leahy Scale: Fair Standing balance comment: walker steadies and creates dynamic stability                    Cognition Arousal/Alertness: Awake/alert Behavior During Therapy: WFL for tasks assessed/performed;Impulsive Overall Cognitive Status: Within Functional Limits for tasks assessed                      Exercises      General Comments General comments (skin integrity, edema, etc.): Pt is optimistic about succeeding at home afteSNF but has limited family assistance      Pertinent Vitals/Pain Pain Assessment: 0-10 Pain Score: 4  Pain Location: R groin Pain Descriptors / Indicators: Aching Pain Intervention(s): Limited activity within patient's tolerance;RN gave pain meds during session;Monitored during session    Home Living                      Prior Function            PT Goals (current goals can now be found in the care plan section) Acute Rehab PT Goals Patient Stated Goal: to go to [place] today Progress towards PT goals: Progressing toward  goals    Frequency  7X/week    PT Plan Current plan remains appropriate    Co-evaluation             End of Session Equipment Utilized During Treatment: Other (comment) (PLW) Activity Tolerance: Patient tolerated treatment well Patient left: in bed;with call bell/phone within reach;Other (comment) (Sat bedside)     Time: 1010-1039 PT Time Calculation (min) (ACUTE ONLY): 29 min  Charges:  $Gait Training:  8-22 mins $Therapeutic Activity: 8-22 mins                    G Codes:      [place], [place] 09-03-14, 10:55 AM   Mee Hives, PT MS Acute Rehab Dept. Number: [phone]

## 2014-09-02 ENCOUNTER — Other Ambulatory Visit: Payer: Self-pay

## 2014-09-02 ENCOUNTER — Non-Acute Institutional Stay (SKILLED_NURSING_FACILITY): Payer: Medicare Other | Admitting: Adult Health

## 2014-09-02 ENCOUNTER — Encounter: Payer: Self-pay | Admitting: Adult Health

## 2014-09-02 DIAGNOSIS — R569 Unspecified convulsions: Secondary | ICD-10-CM | POA: Diagnosis not present

## 2014-09-02 DIAGNOSIS — I5032 Chronic diastolic (congestive) heart failure: Secondary | ICD-10-CM

## 2014-09-02 DIAGNOSIS — F419 Anxiety disorder, unspecified: Secondary | ICD-10-CM | POA: Diagnosis not present

## 2014-09-02 DIAGNOSIS — I10 Essential (primary) hypertension: Secondary | ICD-10-CM

## 2014-09-02 DIAGNOSIS — E785 Hyperlipidemia, unspecified: Secondary | ICD-10-CM

## 2014-09-02 DIAGNOSIS — D62 Acute posthemorrhagic anemia: Secondary | ICD-10-CM

## 2014-09-02 DIAGNOSIS — S72001S Fracture of unspecified part of neck of right femur, sequela: Secondary | ICD-10-CM

## 2014-09-02 DIAGNOSIS — S52501S Unspecified fracture of the lower end of right radius, sequela: Secondary | ICD-10-CM | POA: Diagnosis not present

## 2014-09-02 DIAGNOSIS — K5900 Constipation, unspecified: Secondary | ICD-10-CM | POA: Diagnosis not present

## 2014-09-02 MED ORDER — ALPRAZOLAM 0.25 MG PO TABS: 0.2500 mg | ORAL_TABLET | Freq: Three times a day (TID) | ORAL | Status: DC | PRN

## 2014-09-02 MED ORDER — HYDROCODONE-ACETAMINOPHEN 5-325 MG PO TABS: 1.0000 | ORAL_TABLET | Freq: Four times a day (QID) | ORAL | Status: DC | PRN

## 2014-09-02 NOTE — Telephone Encounter (Signed)
Rx faxed to [facility] @ [phone], phone number [phone]  

## 2014-09-03 NOTE — Progress Notes (Signed)
Patient ID: Kayla Kent, female   DOB: Sep 17, 1932, 80 y.o.   MRN: [id number]   09/02/14  Facility:  Nursing Home Location:  [place] Room Number: 702-P LEVEL OF CARE:  SNF (31)   Chief Complaint  Patient presents with  . Hospitalization Follow-up    Right hip fracture S/P Prosthetic replacement, Right distal radius fracture S/P ORIF, Hypertension, Hyperlipidemia, Constipation, Anxiety, Seizure, Anemia and CHF    HISTORY OF PRESENT ILLNESS:  This is an 80 year old female who has been admitted to [facility] on 08/31/14 from [facility] with Right hip fracture S/P Prosthetic replacement and Right distal radius fracture S/P ORIF. She has been admitted for a short-term rehabilitation.  REASSESSMENT OF ONGOING PROBLEMS:  HTN: Pt 's HTN remains stable.  Denies CP, sob, DOE, pedal edema, headaches, dizziness or visual disturbances.  No complications from the medications currently being used.  Last BP : 134/58  SEIZURE DISORDER: The patient's seizure disorder remains stable. No complications reported from the medications presently being used. Staff do not report any recent seizure activity.  CHF:The patient does not relate significant weight changes, denies sob, DOE, orthopnea, PNDs, pedal edema, palpitations or chest pain.  CHF remains stable.  No complications form the medications being used.   PAST MEDICAL HISTORY:  Past Medical History  Diagnosis Date  . Anxiety   . Paroxysmal supraventricular tachycardia   . Uterine prolapse without mention of vaginal wall prolapse   . HTN (hypertension)   . HLD (hyperlipidemia)   . Intolerance of drug     orthostatic  . PVD (peripheral vascular disease)   . Allergic rhinitis   . Syncope and collapse     CURRENT MEDICATIONS: Reviewed per MAR/see medication list  Allergies  Allergen Reactions  . Chocolate   . Codeine   . Diazepam   . Fruit & Vegetable Daily [Nutritional Supplements] Hives and  Swelling    peaches  . Iodine   . Iohexol      Code: HIVES, Desc: pt gets 13 hr pre-meds, Onset Date: [id number]   . Latex Hives  . Peach [Prunus Persica]   . Peanut-Containing Drug Products Hives and Swelling  . Penicillins   . Strawberry   . Sulfonamide Derivatives   . Wheat     Shortness of breath     REVIEW OF SYSTEMS:  GENERAL: no change in appetite, no fatigue, no weight changes, no fever, chills or weakness RESPIRATORY: no cough, SOB, DOE, wheezing, hemoptysis CARDIAC: no chest pain, edema or palpitations GI: no abdominal pain, diarrhea, constipation, heart burn, nausea or vomiting  PHYSICAL EXAMINATION  GENERAL: no acute distress, normal body habitus EYES: conjunctivae normal, normal eye lids, right lazy eye NECK: supple, trachea midline, no neck masses, no thyroid tenderness, no thyromegaly LYMPHATICS: no LAN in the neck, no supraclavicular LAN RESPIRATORY: breathing is even & unlabored, BS CTAB CARDIAC: RRR, no murmur,no extra heart sounds, no edema GI: abdomen soft, normal BS, no masses, no tenderness, no hepatomegaly, no splenomegaly EXTREMITIES:  Able to move all 4 extremities; right wrist with soft cast and right arm on sling; able to wiggle right fingers PSYCHIATRIC: the patient is alert & oriented to person, affect & behavior appropriate  LABS/RADIOLOGY: Labs reviewed: Basic Metabolic Panel:  Recent Labs  08/28/14 2015 08/29/14 2350 08/30/14 0430 08/31/14 0620  NA 143  --  139 141  K 3.4*  --  4.9 5.0  CL 105  --  106 107  CO2  24  --  20 20  GLUCOSE 127*  --  139* 94  BUN 16  --  17 22  CREATININE 1.00 0.88 0.90 0.96  CALCIUM 9.1  --  7.9* 8.1*   CBC:  Recent Labs  08/28/14 2015 08/29/14 2350 08/30/14 0430 08/31/14 0620  WBC 13.8* 10.5 7.5 9.0  NEUTROABS 12.0*  --   --   --   HGB 12.4 12.0 10.7* 10.2*  HCT 37.8 35.6* 33.2* 31.0*  MCV 88.3 87.7 88.8 90.9  PLT 173 134* 121* 130*    Dg Wrist Complete Right  08/28/2014   CLINICAL  DATA:  Fall while walking this afternoon. Initial evaluation.  EXAM: RIGHT WRIST - COMPLETE 3+ VIEW  COMPARISON:  None.  FINDINGS: Comminuted posterior angulated displaced distal right radial fracture with extension to the radiocarpal joint space noted. Displaced ulnar styloid fracture.  IMPRESSION: Comminuted displaced posteriorly angulated fracture of the distal radius with extension to the radiocarpal joint space. Displaced ulnar styloid fracture.   Electronically Signed   By: Marcello Moores  Register   On: 08/28/2014 18:48   Dg Hip Complete Right  08/28/2014   CLINICAL DATA:  Fall wall wall and this afternoon. Right lateral hip pain. Initial encounter.  EXAM: RIGHT HIP - COMPLETE 2+ VIEW  COMPARISON:  None.  FINDINGS: There is a right femoral neck fracture with varus angulation. No subluxation or dislocation. Left hip and SI joints are unremarkable.  IMPRESSION: Right femoral neck fracture with varus angulation.   Electronically Signed   By: Rolm Baptise M.D.   On: 08/28/2014 18:48   Dg Hip Operative Right  08/29/2014   CLINICAL DATA:  Right femoral neck fracture. Arthroplasty. Initial encounter.  EXAM: DG OPERATIVE RIGHT HIP 1-2 VIEWS  TECHNIQUE: Fluoroscopic spot image(s) were submitted for interpretation post-operatively.  COMPARISON:  Radiographs 08/28/2014.  FINDINGS: Two spot fluoroscopic images of the right hip demonstrate interval resection of the right femoral head and bipolar hemiarthroplasty. The hardware appears well positioned. There is no evidence of acute fracture or dislocation. There is gas within the operative bed.  IMPRESSION: Intraoperative views following right hip hemiarthroplasty. No demonstrated complication.   Electronically Signed   By: Camie Patience M.D.   On: 08/29/2014 20:12   Pelvis Portable  08/29/2014   CLINICAL DATA:  Status post right hip replacement after intertrochanteric fracture.  EXAM: PORTABLE PELVIS 1-2 VIEWS  COMPARISON:  Plain films of right hip 08/28/2014.   FINDINGS: Bipolar right hip hemiarthroplasty is in place. The device is located. No fracture is seen. Surgical staples and gas in the soft tissues are noted.  IMPRESSION: Right hip replacement without evidence of complication.   Electronically Signed   By: Inge Rise M.D.   On: 08/29/2014 23:40   Dg Chest Port 1 View  08/28/2014   CLINICAL DATA:  Preop evaluation following hip fracture, hypertension  EXAM: PORTABLE CHEST - 1 VIEW  COMPARISON:  07/16/2009  FINDINGS: Cardiac shadow is stable. The lungs are again hyperinflated. Mild increase in central vascular congestion is noted. Mild scarring is noted in the left lung base. No focal confluent infiltrate is seen.  IMPRESSION: Mild vascular congestion.  No other acute abnormality is noted   Electronically Signed   By: Inez Catalina M.D.   On: 08/28/2014 19:21   Dg Femur Right Port  08/28/2014   CLINICAL DATA:  Fall with right hip pain.  EXAM: PORTABLE RIGHT FEMUR - 2 VIEW  COMPARISON:  Right hip same day.  FINDINGS: Images of  the right femur excluding the right hip demonstrate no additional fracture sites. Remainder the exam is unremarkable.  IMPRESSION: No acute findings.   Electronically Signed   By: Marin Olp M.D.   On: 08/28/2014 20:37    ASSESSMENT/PLAN:  Right hip fracture status post prosthetic replacement - for rehabilitation Right distal radius fracture status post ORIF - soft cast with right arm on sling Hypertension - continue on atenolol 50 mg by mouth daily and diltiazem 180 mg by mouth daily Hyperlipidemia - continue Crestor 5 mg by mouth daily at bedtime  Constipation- stable; continue senna 1 tab by mouth twice a day Anxiety - stable; continue Xanax 0.25 mg 1 tab by mouth 3 times a day when necessary Seizure - well controlled; continue Keppra 250 mg by mouth twice a day Anemia, acute blood loss - stable; hemoglobin 67.8 Chronic diastolic CHF - well compensated   Spent 50 minutes in patient care.       [facility], NP [employer] [phone]

## 2014-09-04 ENCOUNTER — Other Ambulatory Visit: Payer: Self-pay | Admitting: *Deleted

## 2014-09-04 MED ORDER — ZOLPIDEM TARTRATE 5 MG PO TABS: ORAL_TABLET | ORAL | Status: DC

## 2014-09-04 NOTE — Telephone Encounter (Signed)
[facility] 

## 2014-09-05 ENCOUNTER — Encounter: Payer: Self-pay | Admitting: Internal Medicine

## 2014-09-05 ENCOUNTER — Non-Acute Institutional Stay (SKILLED_NURSING_FACILITY): Payer: Medicare Other | Admitting: Internal Medicine

## 2014-09-05 DIAGNOSIS — E785 Hyperlipidemia, unspecified: Secondary | ICD-10-CM

## 2014-09-05 DIAGNOSIS — I10 Essential (primary) hypertension: Secondary | ICD-10-CM

## 2014-09-05 DIAGNOSIS — S72001S Fracture of unspecified part of neck of right femur, sequela: Secondary | ICD-10-CM | POA: Diagnosis not present

## 2014-09-05 DIAGNOSIS — G4700 Insomnia, unspecified: Secondary | ICD-10-CM | POA: Diagnosis not present

## 2014-09-05 DIAGNOSIS — S52501S Unspecified fracture of the lower end of right radius, sequela: Secondary | ICD-10-CM

## 2014-09-05 NOTE — Progress Notes (Signed)
Patient ID: Kayla Kent, female   DOB: 1932/02/07, 80 y.o.   MRN: [id number]     [facility]   PCP: Cathlean Cower, MD  Code Status: full code  Allergies  Allergen Reactions  . Chocolate   . Codeine   . Diazepam   . Fruit & Vegetable Daily [Nutritional Supplements] Hives and Swelling    peaches  . Iodine   . Iohexol      Code: HIVES, Desc: pt gets 13 hr pre-meds, Onset Date: [id number]   . Latex Hives  . Peach [Prunus Persica]   . Peanut-Containing Drug Products Hives and Swelling  . Penicillins   . Strawberry   . Sulfonamide Derivatives   . Wheat     Shortness of breath    Chief Complaint  Patient presents with  . New Admit To SNF     HPI:  80 y/o female pt here for STR after hospital admission 08/28/14-08/30/14 post fall with fracture of right femoral neck and right distal radial fracture. She is S/P prosthetic replacement of right femoral neck fracture with open reduction internal fixation of right distal radial fracture, procedure performed on 08/29/2014. She has PMH of HTN and HLD. She is seen in her room today with her daughter present. She says that her bowel regimen is making her have loose stool and she would need this addressed. Her pain is under control. No other concerns  Review of Systems:  Constitutional: Negative for fever, chills, malaise/fatigue and diaphoresis.  HENT: Negative for congestion Respiratory: Negative for cough, sputum production, shortness of breath and wheezing.   Cardiovascular: Negative for chest pain, palpitations, orthopnea and leg swelling.  Gastrointestinal: Negative for heartburn, nausea, vomiting, abdominal pain Genitourinary: Negative for dysuria.  Musculoskeletal: Negative for back pain. Using walker with arm rest and a wheelchair Skin: Negative for itching, rash.  Neurological: Negative for weakness and headaches.  Psychiatric/Behavioral: Negative for depression  Past Medical History    Diagnosis Date  . Anxiety   . Paroxysmal supraventricular tachycardia   . Uterine prolapse without mention of vaginal wall prolapse   . HTN (hypertension)   . HLD (hyperlipidemia)   . Intolerance of drug     orthostatic  . PVD (peripheral vascular disease)   . Allergic rhinitis   . Syncope and collapse    Past Surgical History  Procedure Laterality Date  . Corrective eye surgery      as a child  . Cholecystectomy    . Tonsillectomy    . Ivd removed    . Stress cardiolite  08/05/93  . Rf ablation psvt      summer '10  . Total hip arthroplasty Right 08/29/2014    Procedure: Right Hip Hemi Arthroplasty;  Surgeon: Marianna Payment, MD;  Location: [facility];  Service: Orthopedics;  Laterality: Right;  Hip procedure 1st wants Peg Board, [employer], [employer]. Wrist - wants [employer].  Surgeon available at 1645.  Surgeon to inform sales reps.  Marland Kitchen Open reduction internal fixation (orif) distal radial fracture Right 08/29/2014    Procedure: OPEN REDUCTION INTERNAL FIXATION (ORIF) DISTAL RADIAL FRACTURE;  Surgeon: Marianna Payment, MD;  Location: [facility];  Service: Orthopedics;  Laterality: Right;   Social History:   reports that she quit smoking about 58 years ago. She has never used smokeless tobacco. She reports that she does not drink alcohol or use illicit drugs.  Family History  Problem Relation Age of Onset  . Coronary artery  disease      family hx (also positive for lipids)  . Hypertension      family hx  . Colon cancer Neg Hx   . Breast cancer Neg Hx   . Diabetes Neg Hx   . Mental illness Father     suicide  . Arthritis Father   . Hyperlipidemia Sister   . Hypertension Sister   . Heart disease Brother     CAD/MI  . Hypertension Brother     Medications: Patient's Medications  New Prescriptions   No medications on file  Previous Medications   ACETAMINOPHEN (TYLENOL) 500 MG TABLET    Take 1 tablet (500 mg total) by mouth every 6 (six) hours as needed.   ALPRAZOLAM  (XANAX) 0.25 MG TABLET    Take 1 tablet (0.25 mg total) by mouth 3 (three) times daily as needed for anxiety.   ASPIRIN 81 MG EC TABLET    Take 81 mg by mouth daily.     ATENOLOL (TENORMIN) 50 MG TABLET    Take 50 mg by mouth daily.   CILOSTAZOL (PLETAL) 100 MG TABLET    Take one-half tablet by mouth in the morning and one tablet in the evening.   DILTIAZEM (DILACOR XR) 180 MG 24 HR CAPSULE    Take 180 mg by mouth daily.   ENOXAPARIN (LOVENOX) 40 MG/0.4ML INJECTION    Inject 0.4 mLs (40 mg total) into the skin daily.   HYDROCODONE-ACETAMINOPHEN (NORCO/VICODIN) 5-325 MG PER TABLET    Take 1-2 tablets by mouth every 6 (six) hours as needed for moderate pain (DO NOT EXCEED 4 GM OF TYLENOL IN 24 HOURS).   LEVETIRACETAM (KEPPRA) 250 MG TABLET    Take 250 mg by mouth 2 (two) times daily.   POLYETHYLENE GLYCOL (MIRALAX / GLYCOLAX) PACKET    Take 17 g by mouth daily as needed for mild constipation.   ROSUVASTATIN (CRESTOR) 5 MG TABLET    Take 5 mg by mouth at bedtime.   SENNA (SENOKOT) 8.6 MG TABS TABLET    Take 1 tablet (8.6 mg total) by mouth 2 (two) times daily.   ZOLPIDEM (AMBIEN) 5 MG TABLET    Take one tablet by mouth every night at bedtime as needed for insomnia  Modified Medications   No medications on file  Discontinued Medications   No medications on file     Physical Exam: Filed Vitals:   09/05/14 1200  BP: 103/69  Pulse: 85  Temp: 97.6 F (36.4 C)  Resp: 18  SpO2: 96%    General- elderly female in no acute distress Head- atraumatic, normocephalic Neck- no cervical lymphadenopathy, no thyromegaly, no jugular vein distension, no carotid bruit Throat- moist mucus membrane Cardiovascular- normal s1,s2, no murmurs Respiratory- bilateral clear to auscultation, no wheeze, no rhonchi, no crackles, no use of accessory muscles Abdomen- bowel sounds present, soft, non tender Musculoskeletal- able to move all 4 extremities, right forearm in cast, able to move all digits and good  capillary refill, right hip surgical incision site clean and dry  Neurological- no focal deficit Skin- warm and dry Psychiatry- alert and oriented, normal mood and affect    Labs reviewed: Basic Metabolic Panel:  Recent Labs  08/28/14 2015 08/29/14 2350 08/30/14 0430 08/31/14 0620  NA 143  --  139 141  K 3.4*  --  4.9 5.0  CL 105  --  106 107  CO2 24  --  20 20  GLUCOSE 127*  --  139* 94  BUN 16  --  17 22  CREATININE 1.00 0.88 0.90 0.96  CALCIUM 9.1  --  7.9* 8.1*   Liver Function Tests: No results for input(s): AST, ALT, ALKPHOS, BILITOT, PROT, ALBUMIN in the last 8760 hours. No results for input(s): LIPASE, AMYLASE in the last 8760 hours. No results for input(s): AMMONIA in the last 8760 hours. CBC:  Recent Labs  08/28/14 2015 08/29/14 2350 08/30/14 0430 08/31/14 0620  WBC 13.8* 10.5 7.5 9.0  NEUTROABS 12.0*  --   --   --   HGB 12.4 12.0 10.7* 10.2*  HCT 37.8 35.6* 33.2* 31.0*  MCV 88.3 87.7 88.8 90.9  PLT 173 134* 121* 130*    Assessment/Plan  Right femoral neck fracture/distal radial fracture Status post repair, right arm in soft cast. Incision site on right hip clean. Continue lovenox for dvt prophylaxis. Continue norco 5-325 1-2 tab q6h prn pain.  Continue skin care. Will have patient work with PT/OT as tolerated to regain strength and restore function.  Fall precautions are in place. Needs suture removal in 2 weeks  and has orthopedic follow up appointment  Hypertension Stable. Continue atenolol 50 mg daily and diltiazem 180 mg daily. Monitor bp  Dyslipidemia. Continue Crestor  Constipation Continue miralax prn only and d/c senna for now  Insomnia Continue ambien 5 mg qhs prn  Family/ staff Communication: reviewed care plan with patient and nursing supervisor    Goals of care: short term rehabilitation    Labs/tests ordered: none    Blanchie Serve, MD  [place] [phone] (Monday-Friday 8 am - 5 pm) [phone]  (afterhours)

## 2014-09-06 ENCOUNTER — Non-Acute Institutional Stay (SKILLED_NURSING_FACILITY): Payer: Medicare Other | Admitting: Adult Health

## 2014-09-06 ENCOUNTER — Encounter: Payer: Self-pay | Admitting: Adult Health

## 2014-09-06 DIAGNOSIS — R569 Unspecified convulsions: Secondary | ICD-10-CM | POA: Diagnosis not present

## 2014-09-06 DIAGNOSIS — I5032 Chronic diastolic (congestive) heart failure: Secondary | ICD-10-CM

## 2014-09-06 DIAGNOSIS — I10 Essential (primary) hypertension: Secondary | ICD-10-CM

## 2014-09-06 DIAGNOSIS — K5900 Constipation, unspecified: Secondary | ICD-10-CM

## 2014-09-06 DIAGNOSIS — G4700 Insomnia, unspecified: Secondary | ICD-10-CM

## 2014-09-06 DIAGNOSIS — S72001S Fracture of unspecified part of neck of right femur, sequela: Secondary | ICD-10-CM | POA: Diagnosis not present

## 2014-09-06 DIAGNOSIS — S52501S Unspecified fracture of the lower end of right radius, sequela: Secondary | ICD-10-CM | POA: Diagnosis not present

## 2014-09-06 DIAGNOSIS — F419 Anxiety disorder, unspecified: Secondary | ICD-10-CM

## 2014-09-06 DIAGNOSIS — E785 Hyperlipidemia, unspecified: Secondary | ICD-10-CM

## 2014-09-06 DIAGNOSIS — D62 Acute posthemorrhagic anemia: Secondary | ICD-10-CM

## 2014-09-10 DIAGNOSIS — S52531D Colles' fracture of right radius, subsequent encounter for closed fracture with routine healing: Secondary | ICD-10-CM | POA: Diagnosis not present

## 2014-09-10 DIAGNOSIS — R262 Difficulty in walking, not elsewhere classified: Secondary | ICD-10-CM | POA: Diagnosis not present

## 2014-09-10 DIAGNOSIS — S72001D Fracture of unspecified part of neck of right femur, subsequent encounter for closed fracture with routine healing: Secondary | ICD-10-CM | POA: Diagnosis not present

## 2014-09-16 DIAGNOSIS — R262 Difficulty in walking, not elsewhere classified: Secondary | ICD-10-CM | POA: Diagnosis not present

## 2014-09-16 DIAGNOSIS — S72001D Fracture of unspecified part of neck of right femur, subsequent encounter for closed fracture with routine healing: Secondary | ICD-10-CM | POA: Diagnosis not present

## 2014-09-18 DIAGNOSIS — R262 Difficulty in walking, not elsewhere classified: Secondary | ICD-10-CM | POA: Diagnosis not present

## 2014-09-18 DIAGNOSIS — S72001D Fracture of unspecified part of neck of right femur, subsequent encounter for closed fracture with routine healing: Secondary | ICD-10-CM | POA: Diagnosis not present

## 2014-09-19 ENCOUNTER — Encounter (HOSPITAL_COMMUNITY): Payer: Self-pay | Admitting: Orthopaedic Surgery

## 2014-09-21 ENCOUNTER — Inpatient Hospital Stay (HOSPITAL_COMMUNITY)
Admission: EM | Admit: 2014-09-21 | Discharge: 2014-09-26 | DRG: 291 | Disposition: A | Discharge status: Home / Self Care | Payer: Medicare Other | Attending: Internal Medicine | Admitting: Internal Medicine

## 2014-09-21 ENCOUNTER — Emergency Department (HOSPITAL_COMMUNITY): Payer: Medicare Other

## 2014-09-21 ENCOUNTER — Encounter (HOSPITAL_COMMUNITY): Payer: Self-pay | Admitting: Emergency Medicine

## 2014-09-21 DIAGNOSIS — G458 Other transient cerebral ischemic attacks and related syndromes: Secondary | ICD-10-CM | POA: Insufficient documentation

## 2014-09-21 DIAGNOSIS — R7989 Other specified abnormal findings of blood chemistry: Secondary | ICD-10-CM | POA: Diagnosis present

## 2014-09-21 DIAGNOSIS — I4892 Unspecified atrial flutter: Secondary | ICD-10-CM | POA: Diagnosis present

## 2014-09-21 DIAGNOSIS — Z96641 Presence of right artificial hip joint: Secondary | ICD-10-CM | POA: Diagnosis present

## 2014-09-21 DIAGNOSIS — I6782 Cerebral ischemia: Secondary | ICD-10-CM | POA: Diagnosis not present

## 2014-09-21 DIAGNOSIS — R200 Anesthesia of skin: Secondary | ICD-10-CM | POA: Diagnosis not present

## 2014-09-21 DIAGNOSIS — E785 Hyperlipidemia, unspecified: Secondary | ICD-10-CM | POA: Diagnosis present

## 2014-09-21 DIAGNOSIS — F419 Anxiety disorder, unspecified: Secondary | ICD-10-CM | POA: Diagnosis present

## 2014-09-21 DIAGNOSIS — J9601 Acute respiratory failure with hypoxia: Secondary | ICD-10-CM | POA: Diagnosis not present

## 2014-09-21 DIAGNOSIS — T501X5A Adverse effect of loop [high-ceiling] diuretics, initial encounter: Secondary | ICD-10-CM | POA: Diagnosis not present

## 2014-09-21 DIAGNOSIS — R069 Unspecified abnormalities of breathing: Secondary | ICD-10-CM | POA: Diagnosis not present

## 2014-09-21 DIAGNOSIS — Z8249 Family history of ischemic heart disease and other diseases of the circulatory system: Secondary | ICD-10-CM

## 2014-09-21 DIAGNOSIS — I4891 Unspecified atrial fibrillation: Secondary | ICD-10-CM | POA: Diagnosis not present

## 2014-09-21 DIAGNOSIS — I10 Essential (primary) hypertension: Secondary | ICD-10-CM | POA: Diagnosis present

## 2014-09-21 DIAGNOSIS — I951 Orthostatic hypotension: Secondary | ICD-10-CM | POA: Diagnosis not present

## 2014-09-21 DIAGNOSIS — Z7982 Long term (current) use of aspirin: Secondary | ICD-10-CM | POA: Diagnosis not present

## 2014-09-21 DIAGNOSIS — M7989 Other specified soft tissue disorders: Secondary | ICD-10-CM | POA: Diagnosis not present

## 2014-09-21 DIAGNOSIS — R531 Weakness: Secondary | ICD-10-CM | POA: Diagnosis not present

## 2014-09-21 DIAGNOSIS — J9811 Atelectasis: Secondary | ICD-10-CM | POA: Diagnosis not present

## 2014-09-21 DIAGNOSIS — R0602 Shortness of breath: Secondary | ICD-10-CM

## 2014-09-21 DIAGNOSIS — I509 Heart failure, unspecified: Secondary | ICD-10-CM | POA: Insufficient documentation

## 2014-09-21 DIAGNOSIS — I658 Occlusion and stenosis of other precerebral arteries: Secondary | ICD-10-CM | POA: Diagnosis not present

## 2014-09-21 DIAGNOSIS — R918 Other nonspecific abnormal finding of lung field: Secondary | ICD-10-CM | POA: Diagnosis not present

## 2014-09-21 DIAGNOSIS — I483 Typical atrial flutter: Secondary | ICD-10-CM | POA: Diagnosis not present

## 2014-09-21 DIAGNOSIS — R208 Other disturbances of skin sensation: Secondary | ICD-10-CM | POA: Diagnosis not present

## 2014-09-21 DIAGNOSIS — R0902 Hypoxemia: Secondary | ICD-10-CM | POA: Diagnosis not present

## 2014-09-21 DIAGNOSIS — Z87891 Personal history of nicotine dependence: Secondary | ICD-10-CM | POA: Diagnosis not present

## 2014-09-21 DIAGNOSIS — I517 Cardiomegaly: Secondary | ICD-10-CM | POA: Diagnosis not present

## 2014-09-21 DIAGNOSIS — R569 Unspecified convulsions: Secondary | ICD-10-CM | POA: Diagnosis present

## 2014-09-21 DIAGNOSIS — H5010 Unspecified exotropia: Secondary | ICD-10-CM | POA: Diagnosis present

## 2014-09-21 DIAGNOSIS — I5033 Acute on chronic diastolic (congestive) heart failure: Principal | ICD-10-CM | POA: Diagnosis present

## 2014-09-21 DIAGNOSIS — G459 Transient cerebral ischemic attack, unspecified: Secondary | ICD-10-CM | POA: Diagnosis not present

## 2014-09-21 DIAGNOSIS — J189 Pneumonia, unspecified organism: Secondary | ICD-10-CM | POA: Diagnosis present

## 2014-09-21 DIAGNOSIS — J9600 Acute respiratory failure, unspecified whether with hypoxia or hypercapnia: Secondary | ICD-10-CM | POA: Diagnosis present

## 2014-09-21 DIAGNOSIS — R748 Abnormal levels of other serum enzymes: Secondary | ICD-10-CM | POA: Diagnosis not present

## 2014-09-21 DIAGNOSIS — J90 Pleural effusion, not elsewhere classified: Secondary | ICD-10-CM | POA: Diagnosis not present

## 2014-09-21 DIAGNOSIS — I639 Cerebral infarction, unspecified: Secondary | ICD-10-CM

## 2014-09-21 DIAGNOSIS — R799 Abnormal finding of blood chemistry, unspecified: Secondary | ICD-10-CM | POA: Diagnosis not present

## 2014-09-21 DIAGNOSIS — Y95 Nosocomial condition: Secondary | ICD-10-CM | POA: Diagnosis present

## 2014-09-21 DIAGNOSIS — J810 Acute pulmonary edema: Secondary | ICD-10-CM

## 2014-09-21 DIAGNOSIS — I739 Peripheral vascular disease, unspecified: Secondary | ICD-10-CM | POA: Diagnosis present

## 2014-09-21 LAB — BASIC METABOLIC PANEL
Anion gap: 15 (ref 5–15)
BUN: 13 mg/dL (ref 6–23)
CO2: 23 mEq/L (ref 19–32)
CREATININE: 0.8 mg/dL (ref 0.50–1.10)
Calcium: 9.2 mg/dL (ref 8.4–10.5)
Chloride: 106 mEq/L (ref 96–112)
GFR, EST AFRICAN AMERICAN: 79 mL/min — AB (ref >90)
GFR, EST NON AFRICAN AMERICAN: 68 mL/min — AB (ref >90)
GLUCOSE: 112 mg/dL — AB (ref 70–99)
Potassium: 4.2 mEq/L (ref 3.7–5.3)
Sodium: 144 mEq/L (ref 137–147)

## 2014-09-21 LAB — URINALYSIS, ROUTINE W REFLEX MICROSCOPIC
Bilirubin Urine: NEGATIVE
GLUCOSE, UA: NEGATIVE mg/dL
KETONES UR: NEGATIVE mg/dL
LEUKOCYTES UA: NEGATIVE
Nitrite: POSITIVE — AB
Protein, ur: 30 mg/dL — AB
SPECIFIC GRAVITY, URINE: 1.015 (ref 1.005–1.030)
Urobilinogen, UA: 0.2 mg/dL (ref 0.0–1.0)
pH: 5.5 (ref 5.0–8.0)

## 2014-09-21 LAB — PRO B NATRIURETIC PEPTIDE: Pro B Natriuretic peptide (BNP): 6652 pg/mL — ABNORMAL HIGH (ref 0–450)

## 2014-09-21 LAB — URINE MICROSCOPIC-ADD ON

## 2014-09-21 LAB — I-STAT TROPONIN, ED: Troponin i, poc: 0.02 ng/mL (ref 0.00–0.08)

## 2014-09-21 LAB — STREP PNEUMONIAE URINARY ANTIGEN: Strep Pneumo Urinary Antigen: NEGATIVE

## 2014-09-21 MED ORDER — SODIUM CHLORIDE 0.9 % IV SOLN
500.0000 mg | Freq: Two times a day (BID) | INTRAVENOUS | Status: DC
  Administered 2014-09-22 – 2014-09-23 (×3): 500 mg via INTRAVENOUS
  Filled 2014-09-21 (×4): qty 500

## 2014-09-21 MED ORDER — SODIUM CHLORIDE 0.9 % IV SOLN: 250.0000 mL | INTRAVENOUS | Status: DC | PRN

## 2014-09-21 MED ORDER — DILTIAZEM HCL ER 180 MG PO CP24
180.0000 mg | ORAL_CAPSULE | Freq: Every day | ORAL | Status: DC
  Administered 2014-09-22 – 2014-09-26 (×5): 180 mg via ORAL
  Filled 2014-09-21 (×9): qty 1

## 2014-09-21 MED ORDER — ONDANSETRON HCL 4 MG/2ML IJ SOLN
4.0000 mg | Freq: Four times a day (QID) | INTRAMUSCULAR | Status: DC | PRN
  Administered 2014-09-23: 4 mg via INTRAVENOUS
  Filled 2014-09-21: qty 2

## 2014-09-21 MED ORDER — ACETAMINOPHEN 500 MG PO TABS: 500.0000 mg | ORAL_TABLET | Freq: Four times a day (QID) | ORAL | Status: DC | PRN

## 2014-09-21 MED ORDER — ZOLPIDEM TARTRATE 5 MG PO TABS
5.0000 mg | ORAL_TABLET | Freq: Every evening | ORAL | Status: DC | PRN
  Administered 2014-09-21 – 2014-09-25 (×5): 5 mg via ORAL
  Filled 2014-09-21 (×6): qty 1

## 2014-09-21 MED ORDER — ONDANSETRON HCL 4 MG PO TABS
4.0000 mg | ORAL_TABLET | Freq: Four times a day (QID) | ORAL | Status: DC | PRN
  Administered 2014-09-24: 4 mg via ORAL
  Filled 2014-09-21: qty 1

## 2014-09-21 MED ORDER — LEVETIRACETAM 250 MG PO TABS
250.0000 mg | ORAL_TABLET | Freq: Two times a day (BID) | ORAL | Status: DC
  Administered 2014-09-21 – 2014-09-26 (×10): 250 mg via ORAL
  Filled 2014-09-21 (×13): qty 1

## 2014-09-21 MED ORDER — VANCOMYCIN HCL IN DEXTROSE 750-5 MG/150ML-% IV SOLN
750.0000 mg | Freq: Once | INTRAVENOUS | Status: AC
  Administered 2014-09-21: 750 mg via INTRAVENOUS
  Filled 2014-09-21: qty 150

## 2014-09-21 MED ORDER — DOCUSATE SODIUM 100 MG PO CAPS
100.0000 mg | ORAL_CAPSULE | Freq: Two times a day (BID) | ORAL | Status: DC
  Administered 2014-09-21 – 2014-09-26 (×10): 100 mg via ORAL
  Filled 2014-09-21 (×11): qty 1

## 2014-09-21 MED ORDER — ATENOLOL 25 MG PO TABS
50.0000 mg | ORAL_TABLET | Freq: Every day | ORAL | Status: DC
  Administered 2014-09-22 – 2014-09-23 (×2): 50 mg via ORAL
  Filled 2014-09-21 (×3): qty 2

## 2014-09-21 MED ORDER — SODIUM CHLORIDE 0.9 % IJ SOLN
3.0000 mL | Freq: Two times a day (BID) | INTRAMUSCULAR | Status: DC
  Administered 2014-09-21 – 2014-09-24 (×4): 3 mL via INTRAVENOUS

## 2014-09-21 MED ORDER — ROSUVASTATIN CALCIUM 10 MG PO TABS
5.0000 mg | ORAL_TABLET | Freq: Every day | ORAL | Status: DC
  Administered 2014-09-21 – 2014-09-23 (×2): 5 mg via ORAL
  Filled 2014-09-21 (×3): qty 0.5

## 2014-09-21 MED ORDER — VANCOMYCIN HCL 10 G IV SOLR
1250.0000 mg | Freq: Once | INTRAVENOUS | Status: DC
  Filled 2014-09-21: qty 1250

## 2014-09-21 MED ORDER — ENOXAPARIN SODIUM 40 MG/0.4ML ~~LOC~~ SOLN
40.0000 mg | Freq: Every day | SUBCUTANEOUS | Status: DC
  Administered 2014-09-21 – 2014-09-24 (×4): 40 mg via SUBCUTANEOUS
  Filled 2014-09-21 (×4): qty 0.4

## 2014-09-21 MED ORDER — ALBUTEROL SULFATE (2.5 MG/3ML) 0.083% IN NEBU: 2.5000 mg | INHALATION_SOLUTION | RESPIRATORY_TRACT | Status: DC | PRN

## 2014-09-21 MED ORDER — SODIUM CHLORIDE 0.9 % IJ SOLN
3.0000 mL | Freq: Two times a day (BID) | INTRAMUSCULAR | Status: DC
  Administered 2014-09-22 – 2014-09-26 (×5): 3 mL via INTRAVENOUS

## 2014-09-21 MED ORDER — GUAIFENESIN-DM 100-10 MG/5ML PO SYRP: 5.0000 mL | ORAL_SOLUTION | ORAL | Status: DC | PRN

## 2014-09-21 MED ORDER — LEVOFLOXACIN IN D5W 750 MG/150ML IV SOLN
750.0000 mg | INTRAVENOUS | Status: DC
  Filled 2014-09-21: qty 150

## 2014-09-21 MED ORDER — POLYETHYLENE GLYCOL 3350 17 G PO PACK
17.0000 g | PACK | Freq: Every day | ORAL | Status: DC | PRN
  Administered 2014-09-23 – 2014-09-26 (×3): 17 g via ORAL
  Filled 2014-09-21 (×3): qty 1

## 2014-09-21 MED ORDER — SODIUM CHLORIDE 0.9 % IJ SOLN: 3.0000 mL | INTRAMUSCULAR | Status: DC | PRN

## 2014-09-21 MED ORDER — DEXTROSE 5 % IV SOLN
1.0000 g | Freq: Two times a day (BID) | INTRAVENOUS | Status: DC
  Administered 2014-09-22 (×3): 1 g via INTRAVENOUS
  Filled 2014-09-21 (×5): qty 1

## 2014-09-21 MED ORDER — ASPIRIN 325 MG PO TABS
325.0000 mg | ORAL_TABLET | Freq: Every day | ORAL | Status: DC
  Administered 2014-09-22 – 2014-09-25 (×4): 325 mg via ORAL
  Filled 2014-09-21 (×5): qty 1

## 2014-09-21 MED ORDER — FUROSEMIDE 10 MG/ML IJ SOLN: 40.0000 mg | Freq: Once | INTRAMUSCULAR | Status: DC

## 2014-09-21 MED ORDER — FUROSEMIDE 10 MG/ML IJ SOLN
40.0000 mg | Freq: Every day | INTRAMUSCULAR | Status: AC
  Administered 2014-09-22 – 2014-09-23 (×2): 40 mg via INTRAVENOUS
  Filled 2014-09-21 (×2): qty 4

## 2014-09-21 MED ORDER — HYDROCODONE-ACETAMINOPHEN 5-325 MG PO TABS: 1.0000 | ORAL_TABLET | Freq: Four times a day (QID) | ORAL | Status: DC | PRN

## 2014-09-21 MED ORDER — ALPRAZOLAM 0.25 MG PO TABS
0.2500 mg | ORAL_TABLET | Freq: Three times a day (TID) | ORAL | Status: DC | PRN
  Administered 2014-09-21 – 2014-09-25 (×6): 0.25 mg via ORAL
  Filled 2014-09-21 (×6): qty 1

## 2014-09-21 NOTE — ED Notes (Signed)
Respiratory at bedside.

## 2014-09-21 NOTE — ED Notes (Signed)
MD at bedside. 

## 2014-09-21 NOTE — Progress Notes (Addendum)
ANTIBIOTIC CONSULT NOTE - INITIAL  Pharmacy Consult for levaquin>>d/c, add Vancomycin  Indication: HCAP  Allergies  Allergen Reactions  . Chocolate   . Codeine   . Diazepam   . Fruit & Vegetable Daily [Nutritional Supplements] Hives and Swelling    peaches  . Iodine   . Iohexol      Code: HIVES, Desc: pt gets 13 hr pre-meds, Onset Date: 99371696   . Latex Hives  . Peach [Prunus Persica]   . Peanut-Containing Drug Products Hives and Swelling  . Penicillins Hives, Itching and Swelling  . Strawberry   . Sulfonamide Derivatives   . Wheat     Shortness of breath    Patient Measurements:   Adjusted Body Weight:   Vital Signs: Temp: 98.1 F (36.7 C) (12/05 1352) Temp Source: Oral (12/05 1352) BP: 149/48 mmHg (12/05 1630) Pulse Rate: 71 (12/05 1630) Intake/Output from previous day:   Intake/Output from this shift:    Labs:  Recent Labs  09/21/14 1408  CREATININE 0.80   CrCl cannot be calculated (Unknown ideal weight.). No results for input(s): VANCOTROUGH, VANCOPEAK, VANCORANDOM, GENTTROUGH, GENTPEAK, GENTRANDOM, TOBRATROUGH, TOBRAPEAK, TOBRARND, AMIKACINPEAK, AMIKACINTROU, AMIKACIN in the last 72 hours.   Microbiology: Recent Results (from the past 720 hour(s))  Surgical pcr screen     Status: Abnormal   Collection Time: 08/29/14 12:21 AM  Result Value Ref Range Status   MRSA, PCR NEGATIVE NEGATIVE Final   Staphylococcus aureus POSITIVE (A) NEGATIVE Final    Comment:        The Xpert SA Assay (FDA approved for NASAL specimens in patients over 83 years of age), is one component of a comprehensive surveillance program.  Test performance has been validated by [employer] for patients greater than or equal to 25 year old. It is not intended to diagnose infection nor to guide or monitor treatment. RESULT CALLED TO, READ BACK BY AND VERIFIED WITH: Gailen Shelter RN [phone] GREEN R     Medical History: Past Medical History  Diagnosis Date  . Anxiety    . Paroxysmal supraventricular tachycardia   . Uterine prolapse without mention of vaginal wall prolapse   . HTN (hypertension)   . HLD (hyperlipidemia)   . Intolerance of drug     orthostatic  . PVD (peripheral vascular disease)   . Allergic rhinitis   . Syncope and collapse     Medications:  Scheduled:  . furosemide  40 mg Intravenous Once   Infusions:  . levofloxacin (LEVAQUIN) IV    . vancomycin     Assessment: 80 yo female with HCAP will be switched from cefepime to levaquin due to allergy. CrCl ~42 when adjusted SCr to 1 due to age  Goal of Therapy:  Resolution of infection   Plan:  - Levaquin 750 mg iv q48h - monitor culture and plan on antibiotic  So, Tsz-Yin 09/21/2014,5:49 PM  Addendum: Adding vancomycin. Also spoke to Dr. Conley Canal who wants to continue with cefepime and not levaquin (aware of allergy).  Plan: 1) Vancomycin 750mg  IV x 1 then 500mg  IV q12 2) Follow renal function, cultures, level if needed.  Nena Jordan, PharmD, BCPS 09/21/2014, 8:21 PM

## 2014-09-21 NOTE — Progress Notes (Signed)
Placed pt on 6L [place]. Spo2 94%. Rn aware. Rt will continue to monitor. Pt denies SOB at this time.

## 2014-09-21 NOTE — ED Notes (Signed)
RT at bedside.

## 2014-09-21 NOTE — H&P (Addendum)
[facility] History and Physical  Kayla Kent:[id number] DOB: 05-26-1932 DOA: 09/21/2014  Referring physician: EDP PCP: Cathlean Cower, MD   Chief Complaint:  Weakness and Shortness of breath  HPI: Kayla Kent is a 80 y.o. female  With recent right hip fracture and right distal radius fracture, chronic diastolic heart failure who comes to the emergency room with shortness of breath starting this morning. She had gone to short-term skilled nursing facility after surgical repair of her right hip and was on Lovenox at that time. She was subsequently discharged to her daughter's house where she received another week of Lovenox and then switched to full dose aspirin daily. Several days ago, she started having a cough productive of yellow sputum. She started feeling weak and had difficulty bearing weight on her left leg. She denies fevers chills rhinorrhea sore throat chest pain leg pain or swelling. She woke up suddenly today with dyspnea. Worse with lying supine.  When EMS arrived, her oxygen saturations were reportedly in the 70s on room air. She came to the emergency room on a nonrebreather mask. Currently she is on nasal cannula oxygen, satting in the mid 90s.  She is feeling better currently. In the emergency room, chest x-ray shows mild pulmonary edema and possible left lower lobe pneumonia.  ED physician ordered a CT angiogram of the chest, but it has not been done, as patient has an allergy to IV contrast.  Review of Systems:  As above. Complete review of systems otherwise negative.  Past Medical History  Diagnosis Date  . Anxiety   . Paroxysmal supraventricular tachycardia   . Uterine prolapse without mention of vaginal wall prolapse   . HTN (hypertension)   . HLD (hyperlipidemia)   . Intolerance of drug     orthostatic  . PVD (peripheral vascular disease)   . Allergic rhinitis   . Syncope and collapse    Past Surgical History  Procedure Laterality Date  . Corrective  eye surgery      as a child  . Cholecystectomy    . Tonsillectomy    . Ivd removed    . Stress cardiolite  08/05/93  . Rf ablation psvt      summer '10  . Total hip arthroplasty Right 08/29/2014    Procedure: Right Hip Hemi Arthroplasty;  Surgeon: Marianna Payment, MD;  Location: [facility] [place];  Service: Orthopedics;  Laterality: Right;  Hip procedure 1st wants Peg Board, [employer], [employer]. Wrist - wants [employer].  Surgeon available at 1645.  Surgeon to inform sales reps.  Marland Kitchen Open reduction internal fixation (orif) distal radial fracture Right 08/29/2014    Procedure: OPEN REDUCTION INTERNAL FIXATION (ORIF) DISTAL RADIAL FRACTURE;  Surgeon: Marianna Payment, MD;  Location: [place] [facility];  Service: Orthopedics;  Laterality: Right;   Social History:  reports that she quit smoking about 58 years ago. She has never used smokeless tobacco. She reports that she does not drink alcohol or use illicit drugs.  Allergies  Allergen Reactions  . Chocolate   . Codeine   . Diazepam   . Fruit & Vegetable Daily [Nutritional Supplements] Hives and Swelling    peaches  . Iodine   . Iohexol      Code: HIVES, Desc: pt gets 13 hr pre-meds, Onset Date: 58527782   . Latex Hives  . Peach [Prunus Persica]   . Peanut-Containing Drug Products Hives and Swelling  . Penicillins Hives, Itching and Swelling  . Strawberry   .  Sulfonamide Derivatives   . Wheat     Shortness of breath    Family History  Problem Relation Age of Onset  . Coronary artery disease      family hx (also positive for lipids)  . Hypertension      family hx  . Colon cancer Neg Hx   . Breast cancer Neg Hx   . Diabetes Neg Hx   . Mental illness Father     suicide  . Arthritis Father   . Hyperlipidemia Sister   . Hypertension Sister   . Heart disease Brother     CAD/MI  . Hypertension Brother      Prior to Admission medications   Medication Sig Start Date End Date Taking? Authorizing Provider  ALPRAZolam (XANAX) 0.25 MG tablet  Take 1 tablet (0.25 mg total) by mouth 3 (three) times daily as needed for anxiety. 09/02/14  Yes Hennie Duos, MD  aspirin 325 MG tablet Take 325 mg by mouth daily.   Yes Historical Provider, MD  atenolol (TENORMIN) 50 MG tablet Take 50 mg by mouth daily.   Yes Historical Provider, MD  cilostazol (PLETAL) 100 MG tablet Take one-half tablet by mouth in the morning and one tablet in the evening. 02/20/14  Yes Biagio Borg, MD  diltiazem (DILACOR XR) 180 MG 24 hr capsule Take 180 mg by mouth daily.   Yes Historical Provider, MD  levETIRAcetam (KEPPRA) 250 MG tablet Take 250 mg by mouth 2 (two) times daily. 03/28/14  Yes Carmen Dohmeier, MD  rosuvastatin (CRESTOR) 5 MG tablet Take 5 mg by mouth at bedtime.   Yes Historical Provider, MD  senna (SENOKOT) 8.6 MG TABS tablet Take 1 tablet (8.6 mg total) by mouth 2 (two) times daily. 08/30/14  Yes Kelvin Cellar, MD  zolpidem (AMBIEN) 5 MG tablet Take one tablet by mouth every night at bedtime as needed for insomnia 09/04/14  Yes Estill Dooms, MD  acetaminophen (TYLENOL) 500 MG tablet Take 1 tablet (500 mg total) by mouth every 6 (six) hours as needed. 08/29/14   Naiping Eduard Roux, MD  enoxaparin (LOVENOX) 40 MG/0.4ML injection Inject 0.4 mLs (40 mg total) into the skin daily. Patient not taking: Reported on 09/21/2014 08/29/14   Naiping Eduard Roux, MD  HYDROcodone-acetaminophen (NORCO/VICODIN) 5-325 MG per tablet Take 1-2 tablets by mouth every 6 (six) hours as needed for moderate pain (DO NOT EXCEED 4 GM OF TYLENOL IN 24 HOURS). 09/02/14   Hennie Duos, MD  polyethylene glycol [facility] / Floria Raveling) packet Take 17 g by mouth daily as needed for mild constipation. 08/30/14   Kelvin Cellar, MD   Physical Exam: Filed Vitals:   09/21/14 1500 09/21/14 1530 09/21/14 1600 09/21/14 1630  BP: 159/48 160/49 145/50 149/48  Pulse: 57 75 66 71  Temp:      TempSrc:      Resp: 23 18 16 18   SpO2: 96% 96% 100% 99%    Wt Readings from Last 3 Encounters:    09/06/14 60.419 kg (133 lb 3.2 oz)  09/02/14 53.524 kg (118 lb)  08/28/14 53.524 kg (118 lb)  BP 157/57 mmHg  Pulse 78  Temp(Src) 98.1 F (36.7 C) (Oral)  Resp 18  SpO2 93%  General Appearance:    Alert, cooperative, no distress, appears stated age. breathing nonlabored. Talkative.   Head:    Normocephalic, without obvious abnormality, atraumatic  Eyes:    PERRL, conjunctiva/corneas clear, EOM's intact  Nose:   Nares normal, septum midline, mucosa  normal, no drainage    or sinus tenderness  Throat:   Lips, mucosa, and tongue normal; teeth and gums normal. oropharynx without erythema or exudate   Neck:   Supple, symmetrical, trachea midline, no adenopathy;    thyroid:  no enlargement/tenderness/nodules; no carotid   Bruit.  JVD present   Back:     Symmetric, no curvature, ROM normal, no CVA tenderness  Lungs:     rales at left base. No wheezes or rhonchi.   Chest Wall:    No tenderness or deformity   Heart:    Regular rate and rhythm, S1 and S2 normal, no murmur, rub   S3 present  Abdomen:     Soft, non-tender, bowel sounds active all four quadrants,    no masses, no organomegaly  Genitalia:    deferred  Rectal:    deferred  Extremities:   Extremities normal, right arm in a cast, no cyanosis or edema.  No calf tenderness. Homans sign negative.   Pulses:   2+ and symmetric all extremities  Skin:   Skin color, texture, turgor normal, no rashes or lesions  Lymph nodes:   Cervical, supraclavicular, and axillary nodes normal  Neurologic:   CNII-XII intact, normal strength, sensation and reflexes    throughout    Psychiatric: Normal affect. Comment cooperative        Labs on Admission:  Basic Metabolic Panel:  Recent Labs Lab 09/21/14 1408  NA 144  K 4.2  CL 106  CO2 23  GLUCOSE 112*  BUN 13  CREATININE 0.80  CALCIUM 9.2   Liver Function Tests: No results for input(s): AST, ALT, ALKPHOS, BILITOT, PROT, ALBUMIN in the last 168 hours. No results for input(s): LIPASE,  AMYLASE in the last 168 hours. No results for input(s): AMMONIA in the last 168 hours. CBC: No results for input(s): WBC, NEUTROABS, HGB, HCT, MCV, PLT in the last 168 hours. Cardiac Enzymes: No results for input(s): CKTOTAL, CKMB, CKMBINDEX, TROPONINI in the last 168 hours.  BNP (last 3 results)  Recent Labs  09/21/14 1402  PROBNP 6652.0*   CBG: No results for input(s): GLUCAP in the last 168 hours.  Radiological Exams on Admission: Dg Chest Port 1 View  09/21/2014   CLINICAL DATA:  Initial evaluation for weakness and shortness of breath for 2 days, right hip surgery and right hand surgery on November 12th, personal history of controlled hypertension  EXAM: PORTABLE CHEST - 1 VIEW  COMPARISON:  08/28/2014  FINDINGS: Mild to moderate cardiac enlargement. Moderate vascular congestion. Stable aortic arch calcification. Bilateral perihilar vessels are indistinct. Mild blunting left costophrenic angle suggests tiny effusion.  IMPRESSION: Suspect mild pulmonary edema. Limited evaluation of retrocardiac area with opacity and blunting of the costophrenic angle. Possibility of left lower lobe pneumonia not excluded on single frontal view.   Electronically Signed   By: Skipper Cliche M.D.   On: 09/21/2014 16:00   Assessment/Plan Principal Problem:   Acute respiratory failure:  Likely secondary to a combination of healthcare associated pneumonia and acute on chronic diastolic heart failure. Will give vancomycin, cefepime and Lasix. No echocardiogram since 2010. Will repeat. My suspicion of PE is low. However, The degree of hypoxia is out of proportion to pulmonary edema and pneumonia, so will get VQ scan and Doppler of the legs to evaluate for thromboembolism. For now just DVT prophylaxis dose Lovenox. Oxygenating better currently. Admit to telemetry. Active Problems:   HLD (hyperlipidemia)   Anxiety   Essential hypertension   Seizures  HCAP (healthcare-associated pneumonia)   Acute on chronic  diastolic heart failure Recent hip and radius fracture: Will continue physical therapy and occupational therapy.  Code Status: full Family Communication: daughter and son in law at bedside  Time spent: 61 min  [employer] [email or web address] password [facility]

## 2014-09-21 NOTE — ED Notes (Signed)
Pt coming from home. Pt complains of generalized weakness x 2 days. Pt states she has been coughing up Yellow. Pt has Hx of Rt hip surgery and broken RT arm.  EMS states pO2 78 placed on 6L 88. EMS placed on Non- re breather 98% . Pt denise n/V

## 2014-09-21 NOTE — Progress Notes (Signed)
RN states pt spo2 in 70's on RA and drops significantly on 6L [place]. Pt on NRB with spo2 98-100. Will wean as tolerated. RT will continue to monitor.

## 2014-09-21 NOTE — ED Notes (Signed)
Attempted report 

## 2014-09-21 NOTE — ED Notes (Signed)
Pharmacy called and stated patient has an allergy to Penicillins and wanted to know what type of reaction. This nurse asked pt and pt stated she has swelling, itching with a rash and stated she did not want to take anything with Penicillin in it. MD- resident made aware, no recommendations at this time.

## 2014-09-21 NOTE — ED Provider Notes (Signed)
CSN: [id number]     Arrival date & time 09/21/14  1342 History   None    Chief Complaint  Patient presents with  . Weakness  . Shortness of Breath     (Consider location/radiation/quality/duration/timing/severity/associated sxs/prior Treatment) HPI  80 year old female past medical history of hyperlipidemia hypertension presents to the [facility] department today for 3 days of worsening cough weakness shortness of breath. Patient recently had a hip surgery less than one month ago also a prolonged hospital and rehabilitation stay shortly after that. She has not had a fever but has had a productive cough. She feels all over weak right now without any focal area of pain she has no calf tenderness or leg tenderness no leg swelling recently. EMS was called this morning secondary to patient's allover weakness to the point where she couldn't stand out of the bed on EMSs arrival the patient had O2 sats that were in the 80s requiring facemask to get her oxygen up in the mid 90s.  Past Medical History  Diagnosis Date  . Anxiety   . Paroxysmal supraventricular tachycardia   . Uterine prolapse without mention of vaginal wall prolapse   . HTN (hypertension)   . HLD (hyperlipidemia)   . Intolerance of drug     orthostatic  . PVD (peripheral vascular disease)   . Allergic rhinitis   . Syncope and collapse    Past Surgical History  Procedure Laterality Date  . Corrective eye surgery      as a child  . Cholecystectomy    . Tonsillectomy    . Ivd removed    . Stress cardiolite  08/05/93  . Rf ablation psvt      summer '10  . Total hip arthroplasty Right 08/29/2014    Procedure: Right Hip Hemi Arthroplasty;  Surgeon: Marianna Payment, MD;  Location: [place];  Service: Orthopedics;  Laterality: Right;  Hip procedure 1st wants Peg Board, [employer], [employer]. Wrist - wants [employer].  Surgeon available at 1645.  Surgeon to inform sales reps.  Marland Kitchen Open reduction internal fixation (orif) distal radial  fracture Right 08/29/2014    Procedure: OPEN REDUCTION INTERNAL FIXATION (ORIF) DISTAL RADIAL FRACTURE;  Surgeon: Marianna Payment, MD;  Location: [place];  Service: Orthopedics;  Laterality: Right;   Family History  Problem Relation Age of Onset  . Coronary artery disease      family hx (also positive for lipids)  . Hypertension      family hx  . Colon cancer Neg Hx   . Breast cancer Neg Hx   . Diabetes Neg Hx   . Mental illness Father     suicide  . Arthritis Father   . Hyperlipidemia Sister   . Hypertension Sister   . Heart disease Brother     CAD/MI  . Hypertension Brother    History  Substance Use Topics  . Smoking status: Former Smoker    Quit date: 12/08/1955  . Smokeless tobacco: Never Used     Comment: quit in 1995   . Alcohol Use: No   OB History    No data available     Review of Systems  Constitutional: Positive for activity change and fatigue. Negative for fever.  Eyes: Negative for pain.  Respiratory: Positive for cough and shortness of breath. Negative for wheezing.   Endocrine: Positive for polyuria. Negative for polydipsia.  Genitourinary: Negative for pelvic pain.  Neurological: Positive for weakness.  All other systems reviewed and are negative.  Allergies  Chocolate; Codeine; Diazepam; Fruit & vegetable daily; Iodine; Iohexol; Latex; Peach; Peanut-containing drug products; Penicillins; Strawberry; Sulfonamide derivatives; and Wheat  Home Medications   Prior to Admission medications   Medication Sig Start Date End Date Taking? Authorizing Provider  ALPRAZolam (XANAX) 0.25 MG tablet Take 1 tablet (0.25 mg total) by mouth 3 (three) times daily as needed for anxiety. 09/02/14  Yes Hennie Duos, MD  aspirin 325 MG tablet Take 325 mg by mouth daily.   Yes Historical Provider, MD  atenolol (TENORMIN) 50 MG tablet Take 50 mg by mouth daily.   Yes Historical Provider, MD  cilostazol (PLETAL) 100 MG tablet Take one-half tablet by mouth in the  morning and one tablet in the evening. 02/20/14  Yes Biagio Borg, MD  diltiazem (DILACOR XR) 180 MG 24 hr capsule Take 180 mg by mouth daily.   Yes Historical Provider, MD  levETIRAcetam (KEPPRA) 250 MG tablet Take 250 mg by mouth 2 (two) times daily. 03/28/14  Yes Carmen Dohmeier, MD  rosuvastatin (CRESTOR) 5 MG tablet Take 5 mg by mouth at bedtime.   Yes Historical Provider, MD  senna (SENOKOT) 8.6 MG TABS tablet Take 1 tablet (8.6 mg total) by mouth 2 (two) times daily. 08/30/14  Yes Kelvin Cellar, MD  zolpidem (AMBIEN) 5 MG tablet Take one tablet by mouth every night at bedtime as needed for insomnia 09/04/14  Yes Estill Dooms, MD  acetaminophen (TYLENOL) 500 MG tablet Take 1 tablet (500 mg total) by mouth every 6 (six) hours as needed. 08/29/14   Naiping Eduard Roux, MD  enoxaparin (LOVENOX) 40 MG/0.4ML injection Inject 0.4 mLs (40 mg total) into the skin daily. Patient not taking: Reported on 09/21/2014 08/29/14   Naiping Eduard Roux, MD  HYDROcodone-acetaminophen (NORCO/VICODIN) 5-325 MG per tablet Take 1-2 tablets by mouth every 6 (six) hours as needed for moderate pain (DO NOT EXCEED 4 GM OF TYLENOL IN 24 HOURS). 09/02/14   Hennie Duos, MD  polyethylene glycol [facility] / Floria Raveling) packet Take 17 g by mouth daily as needed for mild constipation. 08/30/14   Kelvin Cellar, MD   BP 155/51 mmHg  Pulse 71  Temp(Src) 98.1 F (36.7 C) (Oral)  Resp 22  SpO2 97% Physical Exam  Constitutional: She is oriented to person, place, and time. She appears well-developed and well-nourished.  HENT:  Head: Normocephalic and atraumatic.  Eyes: Conjunctivae and EOM are normal. Right eye exhibits no discharge. Left eye exhibits no discharge.  Cardiovascular: Normal rate and regular rhythm.   Pulmonary/Chest: Effort normal. No respiratory distress. She has rales (LLL).  Abdominal: Soft. She exhibits no distension. There is no tenderness. There is no rebound.  Musculoskeletal: Normal range of motion.  She exhibits no edema or tenderness.  Neurological: She is alert and oriented to person, place, and time.  Skin: Skin is warm and dry.  Nursing note and vitals reviewed.   ED Course  Procedures (including critical care time) Labs Review [place], ED    Imaging Review No results found.   EKG Interpretation None      My ECG Read Indication:sob EKG was personally contemporaneously reviewed by myself. Rate: 71 EKG: normal EKG, normal sinus rhythm, unchanged from previous tracings. Other significant findings: none   MDM   Final diagnoses:  None    80 year old female recent hip surgery here with progressive weakness and shortness of breath and hypoxia over the last  couple days required high levels of oxygen where she doesn't have a history of needing oxygen. Exam with crackles in the left base no leg edema or calf tenderness or pain with dorsiflexion or plantarflexion. Patient's most likely diagnosis will be healthcare associated pneumonia less likely to be poor embolus at this time however if her chest x-ray is clear we will get a CTA to evaluate.  CXR with pulmonary edema and possibly pneumonia. However still hypoxic that doesn't match XR. Still concern for PE however has allergy to iodonated contrast. Vanc/levaquin given and lasix as well. Admitted to medicine for further management and workup.     Merrily Pew, MD 09/21/14 2350  Pamella Pert, MD 09/22/14 1130

## 2014-09-21 NOTE — Progress Notes (Deleted)
ANTIBIOTIC CONSULT NOTE - INITIAL  Pharmacy Consult for cefepime Indication: HCAP  Allergies  Allergen Reactions  . Chocolate   . Codeine   . Diazepam   . Fruit & Vegetable Daily [Nutritional Supplements] Hives and Swelling    peaches  . Iodine   . Iohexol      Code: HIVES, Desc: pt gets 13 hr pre-meds, Onset Date: [id number]   . Latex Hives  . Peach [Prunus Persica]   . Peanut-Containing Drug Products Hives and Swelling  . Penicillins   . Strawberry   . Sulfonamide Derivatives   . Wheat     Shortness of breath    Patient Measurements:   Adjusted Body Weight:   Vital Signs: Temp: 98.1 F (36.7 C) (12/05 1352) Temp Source: Oral (12/05 1352) BP: 149/48 mmHg (12/05 1630) Pulse Rate: 71 (12/05 1630) Intake/Output from previous day:   Intake/Output from this shift:    Labs:  Recent Labs  09/21/14 1408  CREATININE 0.80   CrCl cannot be calculated (Unknown ideal weight.). No results for input(s): VANCOTROUGH, VANCOPEAK, VANCORANDOM, GENTTROUGH, GENTPEAK, GENTRANDOM, TOBRATROUGH, TOBRAPEAK, TOBRARND, AMIKACINPEAK, AMIKACINTROU, AMIKACIN in the last 72 hours.   Microbiology: Recent Results (from the past 720 hour(s))  Surgical pcr screen     Status: Abnormal   Collection Time: 08/29/14 12:21 AM  Result Value Ref Range Status   MRSA, PCR NEGATIVE NEGATIVE Final   Staphylococcus aureus POSITIVE (A) NEGATIVE Final    Comment:        The Xpert SA Assay (FDA approved for NASAL specimens in patients over 4 years of age), is one component of a comprehensive surveillance program.  Test performance has been validated by [employer] for patients greater than or equal to 37 year old. It is not intended to diagnose infection nor to guide or monitor treatment. RESULT CALLED TO, READ BACK BY AND VERIFIED WITH: Gailen Shelter RN [phone] GREEN R     Medical History: Past Medical History  Diagnosis Date  . Anxiety   . Paroxysmal supraventricular tachycardia   .  Uterine prolapse without mention of vaginal wall prolapse   . HTN (hypertension)   . HLD (hyperlipidemia)   . Intolerance of drug     orthostatic  . PVD (peripheral vascular disease)   . Allergic rhinitis   . Syncope and collapse     Medications:  Scheduled:  . furosemide  40 mg Intravenous Once   Infusions:  . vancomycin     Assessment: 80 yo female with HCAP will be started on cefepime.  Patient's SCr was 0.8 (CrCl ~ 42 when adjusted SCr to 1 due to age).  Goal of Therapy:  Resolution of infection  Plan:  - cefepime 2 g iv q24h - monitor culture and plan on antibiotic   Gracia Saggese, Tsz-Yin 09/21/2014,5:24 PM

## 2014-09-22 ENCOUNTER — Inpatient Hospital Stay (HOSPITAL_COMMUNITY): Payer: Medicare Other

## 2014-09-22 DIAGNOSIS — F419 Anxiety disorder, unspecified: Secondary | ICD-10-CM

## 2014-09-22 DIAGNOSIS — M7989 Other specified soft tissue disorders: Secondary | ICD-10-CM

## 2014-09-22 DIAGNOSIS — I517 Cardiomegaly: Secondary | ICD-10-CM

## 2014-09-22 MED ORDER — TECHNETIUM TO 99M ALBUMIN AGGREGATED: 5.0000 | Freq: Once | INTRAVENOUS | Status: AC | PRN

## 2014-09-22 MED ORDER — CETYLPYRIDINIUM CHLORIDE 0.05 % MT LIQD
7.0000 mL | Freq: Two times a day (BID) | OROMUCOSAL | Status: DC
  Administered 2014-09-22 – 2014-09-25 (×6): 7 mL via OROMUCOSAL

## 2014-09-22 MED ORDER — TECHNETIUM TC 99M DIETHYLENETRIAME-PENTAACETIC ACID: 30.0000 | Freq: Once | INTRAVENOUS | Status: AC | PRN

## 2014-09-22 NOTE — Evaluation (Signed)
Physical Therapy Evaluation Patient Details Name: Kayla Kent MRN: [id number] DOB: January 23, 1932 Today's Date: 09/22/2014   History of Present Illness  With recent right hip fracture and right distal radius fracture (dc'd from acute care 11/14), chronic diastolic heart failure who comes to the emergency room with shortness of breath starting this morning. She had gone to short-term skilled nursing facility after surgical repair of her right hip and was on Lovenox at that time. She was subsequently discharged to her daughter's house. Several days ago, she started having a cough productive of yellow sputum. She started feeling weak and had difficulty bearing weight on her left leg. She woke up suddenly today with dyspnea. Worse with lying supine.  When EMS arrived, her oxygen saturations were reportedly in the 70s on room air.  Past Medical History  Diagnosis Date  . Anxiety   . Paroxysmal supraventricular tachycardia   . Uterine prolapse without mention of vaginal wall prolapse   . HTN (hypertension)   . HLD (hyperlipidemia)   . Intolerance of drug     orthostatic  . PVD (peripheral vascular disease)   . Allergic rhinitis   . Syncope and collapse    Past Surgical History  Procedure Laterality Date  . Corrective eye surgery      as a child  . Cholecystectomy    . Tonsillectomy    . Ivd removed    . Stress cardiolite  08/05/93  . Rf ablation psvt      summer '10  . Total hip arthroplasty Right 08/29/2014    Procedure: Right Hip Hemi Arthroplasty;  Surgeon: Marianna Payment, MD;  Location: [place];  Service: Orthopedics;  Laterality: Right;  Hip procedure 1st wants Peg Board, [employer], [employer]. Wrist - wants [employer].  Surgeon available at 1645.  Surgeon to inform sales reps.  Marland Kitchen Open reduction internal fixation (orif) distal radial fracture Right 08/29/2014    Procedure: OPEN REDUCTION INTERNAL FIXATION (ORIF) DISTAL RADIAL FRACTURE;  Surgeon: Marianna Payment, MD;  Location: [place];  Service: Orthopedics;  Laterality: Right;     Clinical Impression   Pt admitted with above. Pt currently with functional limitations due to the deficits listed below (see PT Problem List).  Pt will benefit from skilled PT to increase their independence and safety with mobility to allow discharge to the venue listed below.   Moves well and manages well with platform RW; I favor going home to daughter's home     Follow Up Recommendations Home health PT;Supervision - Intermittent; possibly Outpt PT    Equipment Recommendations  None recommended by PT    Recommendations for Other Services       Precautions / Restrictions Precautions Precautions: Fall Restrictions Weight Bearing Restrictions: Yes RUE Weight Bearing: Weight bearing as tolerated RLE Weight Bearing: Weight bearing as tolerated Other Position/Activity Restrictions: monitor wb through RUE if off platform      Mobility  Bed Mobility Overal bed mobility: Needs Assistance Bed Mobility: Supine to Sit;Sit to Supine     Supine to sit: Min guard Sit to supine: Min guard   General bed mobility comments: Cues for technique and close monitor for NWB R wrist  Transfers Overall transfer level: Needs assistance Equipment used: Right platform walker Transfers: Sit to/from Stand Sit to Stand: Min guard         General transfer comment: min assist to rise to stand and to descend to bed with cues for hand placement  Ambulation/Gait Ambulation/Gait assistance:  Min guard Ambulation Distance (Feet): 75 Feet Assistive device: Right platform walker Gait Pattern/deviations: Step-to pattern;Step-through pattern (heavy footfall L) Gait velocity: overall decreased, pt. impulsive at times with increased velocity   General Gait Details: Noted decr R stance stability; good use of platform RW  Stairs            Wheelchair Mobility    Modified Rankin (Stroke Patients Only)       Balance Overall balance  assessment: Needs assistance         Standing balance support: Bilateral upper extremity supported Standing balance-Leahy Scale: Fair                               Pertinent Vitals/Pain Pain Assessment: Faces Faces Pain Scale: Hurts a little bit Pain Location: R hip Pain Descriptors / Indicators: Grimacing Pain Intervention(s): Limited activity within patient's tolerance;Monitored during session;Repositioned    Home Living Family/patient expects to be discharged to:: Private residence Living Arrangements: Children Available Help at Discharge: Family;Available 24 hours/day Type of Home: House Home Access: Stairs to enter Entrance Stairs-Rails: Left Entrance Stairs-Number of Steps: 5 Home Layout: One level Home Equipment: Environmental consultant - 2 wheels (platform RW)      Prior Function Level of Independence: Independent         Comments: very independent leading up to recent fall; was going to OUtpt PT prior to this admission     Hand Dominance   Dominant Hand: Right    Extremity/Trunk Assessment   Upper Extremity Assessment: Defer to OT evaluation (R short arm cast)           Lower Extremity Assessment: RLE deficits/detail RLE Deficits / Details: s/p anterior THA last month with fair quad set and good ankle pump; notably decr hip stability in stance    Cervical / Trunk Assessment: Normal  Communication   Communication: No difficulties  Cognition Arousal/Alertness: Awake/alert Behavior During Therapy: WFL for tasks assessed/performed;Impulsive Overall Cognitive Status: Within Functional Limits for tasks assessed                      General Comments      Exercises        Assessment/Plan    PT Assessment Patient needs continued PT services  PT Diagnosis Difficulty walking;Acute pain   PT Problem List Decreased activity tolerance;Decreased balance;Decreased mobility;Decreased knowledge of use of DME;Decreased safety awareness;Decreased  knowledge of precautions;Pain  PT Treatment Interventions DME instruction;Gait training;Functional mobility training;Therapeutic activities;Therapeutic exercise;Balance training;Patient/family education   PT Goals (Current goals can be found in the Care Plan section) Acute Rehab PT Goals Patient Stated Goal: to resume social life PT Goal Formulation: With patient Time For Goal Achievement: 10/06/14 Potential to Achieve Goals: Good    Frequency Min 5X/week   Barriers to discharge        Co-evaluation               End of Session Equipment Utilized During Treatment: Gait belt Activity Tolerance: Patient tolerated treatment well Patient left: in bed;with call bell/phone within reach (with [name] from Vascular) Nurse Communication: Mobility status         Time: 5427-0623 PT Time Calculation (min) (ACUTE ONLY): 35 min   Charges:   PT Evaluation $Initial PT Evaluation Tier I: 1 Procedure PT Treatments $Gait Training: 8-22 mins $Therapeutic Activity: 8-22 mins   PT G Codes:          Roney Marion  [place] 09/22/2014, 3:23 PM  Roney Marion, [place] Pager [phone] Office [phone]

## 2014-09-22 NOTE — Progress Notes (Signed)
  Echocardiogram 2D Echocardiogram has been performed.  Donata Clay 09/22/2014, 1:56 PM

## 2014-09-22 NOTE — Progress Notes (Signed)
*  Preliminary Results* Bilateral lower extremity venous duplex completed. Bilateral lower extremities are negative for deep vein thrombosis. There is no evidence of Baker's cyst bilaterally.  09/22/2014  Maudry Mayhew, RVT, RDCS, RDMS

## 2014-09-22 NOTE — Progress Notes (Signed)
PATIENT DETAILS Name: Kayla Kent Age: 80 y.o. Sex: female Date of Birth: [birth year] Admit Date: 09/21/2014 Admitting Physician Delfina Redwood, MD LZJ:QBHAL Jenny Reichmann, MD  Subjective: Feels better today, breathing significantly improved  Assessment/Plan: Principal Problem:   Acute respiratory failure:suspect secondary to acute diastolic CHF. Doubt PNA, repeat CXR and follow blood cultures-if negative then will stop/taper Abx. Low suspicion for VTE, awaiting VQ/Dopplers. Taper off O2  Active Problems:   Acute on chronic diastolic heart failure:Continue with IV Lasix, follow Echo. Recheck lytes in am, improved.    Suspected HCAP: continue IV Vanco/Cefepime-repeat CXR and follow blood cultures-if negative then will stop/taper Abx. No fever and non toxic appearing.     HLD (hyperlipidemia):c/w crestor    Anxiety:stable-continue Xanax    Essential hypertension:moderate control with Atenolol, Cardizem and Lasi    Seizures:c/w Keppra    Recent recent right hip fracture and right distal radius fracture:PT eval  Disposition: Remain inpatient  Antibiotics:  See below   Anti-infectives    Start     Dose/Rate Route Frequency Ordered Stop   09/22/14 0900  vancomycin (VANCOCIN) 500 mg in sodium chloride 0.9 % 100 mL IVPB     500 mg100 mL/hr over 60 Minutes Intravenous Every 12 hours 09/21/14 2023     09/21/14 2200  ceFEPIme (MAXIPIME) 1 g in dextrose 5 % 50 mL IVPB     1 g100 mL/hr over 30 Minutes Intravenous Every 12 hours 09/21/14 2012     09/21/14 2100  vancomycin (VANCOCIN) IVPB 750 mg/150 ml premix     750 mg150 mL/hr over 60 Minutes Intravenous  Once 09/21/14 2023 09/21/14 2335   09/21/14 1900  levofloxacin (LEVAQUIN) IVPB 750 mg  Status:  Discontinued     750 mg100 mL/hr over 90 Minutes Intravenous Every 48 hours 09/21/14 1748 09/21/14 2012   09/21/14 1730  vancomycin (VANCOCIN) 1,250 mg in sodium chloride 0.9 % 250 mL IVPB  Status:  Discontinued     1,250 mg166.7  mL/hr over 90 Minutes Intravenous  Once 09/21/14 1722 09/21/14 2021      DVT Prophylaxis: Prophylactic Lovenox   Code Status: Full code  Family Communication None at bedside  Procedures:  None  CONSULTS:  None  Time spent 40 minutes-which includes 50% of the time with face-to-face with patient/ family and coordinating care related to the above assessment and plan.  MEDICATIONS: Scheduled Meds: . antiseptic oral rinse  7 mL Mouth Rinse BID  . aspirin  325 mg Oral Daily  . atenolol  50 mg Oral Daily  . ceFEPime (MAXIPIME) IV  1 g Intravenous Q12H  . diltiazem  180 mg Oral Daily  . docusate sodium  100 mg Oral BID  . enoxaparin  40 mg Subcutaneous QHS  . furosemide  40 mg Intravenous Once  . furosemide  40 mg Intravenous Daily  . levETIRAcetam  250 mg Oral BID  . rosuvastatin  5 mg Oral QHS  . sodium chloride  3 mL Intravenous Q12H  . sodium chloride  3 mL Intravenous Q12H  . vancomycin  500 mg Intravenous Q12H   Continuous Infusions:  PRN Meds:.sodium chloride, acetaminophen, albuterol, ALPRAZolam, guaiFENesin-dextromethorphan, HYDROcodone-acetaminophen, ondansetron **OR** ondansetron (ZOFRAN) IV, polyethylene glycol, sodium chloride, zolpidem    PHYSICAL EXAM: Vital signs in last 24 hours: Filed Vitals:   09/21/14 1730 09/21/14 1830 09/21/14 1900 09/22/14 0538  BP: 150/53 157/57  149/48  Pulse: 78 78  71  Temp:   98 F (  36.7 C) 98.3 F (36.8 C)  TempSrc:   Oral Oral  Resp: 19 18  18   Height:   5\' 6"  (1.676 m)   Weight:   52.527 kg (115 lb 12.8 oz) 53.524 kg (118 lb)  SpO2: 98% 93%  93%    Weight change:  Filed Weights   09/21/14 1900 09/22/14 0538  Weight: 52.527 kg (115 lb 12.8 oz) 53.524 kg (118 lb)   Body mass index is 19.05 kg/(m^2).   Gen Exam: Awake and alert with clear speech.   Neck: Supple, No JVD.   Chest: B/L Clear.   CVS: S1 S2 Regular, no murmurs.  Abdomen: soft, BS +, non tender, non distended.  Extremities: no edema, lower  extremities warm to touch. Neurologic: Non Focal.   Skin: No Rash.   Wounds: N/A.    Intake/Output from previous day:  Intake/Output Summary (Last 24 hours) at 09/22/14 1057 Last data filed at 09/22/14 0031  Gross per 24 hour  Intake    203 ml  Output    200 ml  Net      3 ml     LAB RESULTS: CBC No results for input(s): WBC, HGB, HCT, PLT, MCV, MCH, MCHC, RDW, LYMPHSABS, MONOABS, EOSABS, BASOSABS, BANDABS in the last 168 hours.  Invalid input(s): NEUTRABS, BANDSABD  Chemistries   Recent Labs Lab 09/21/14 1408  NA 144  K 4.2  CL 106  CO2 23  GLUCOSE 112*  BUN 13  CREATININE 0.80  CALCIUM 9.2    CBG: No results for input(s): GLUCAP in the last 168 hours.  GFR Estimated Creatinine Clearance: 47.4 mL/min (by C-G formula based on Cr of 0.8).  Coagulation profile No results for input(s): INR, PROTIME in the last 168 hours.  Cardiac Enzymes No results for input(s): CKMB, TROPONINI, MYOGLOBIN in the last 168 hours.  Invalid input(s): CK  Invalid input(s): POCBNP No results for input(s): DDIMER in the last 72 hours. No results for input(s): HGBA1C in the last 72 hours. No results for input(s): CHOL, HDL, LDLCALC, TRIG, CHOLHDL, LDLDIRECT in the last 72 hours. No results for input(s): TSH, T4TOTAL, T3FREE, THYROIDAB in the last 72 hours.  Invalid input(s): FREET3 No results for input(s): VITAMINB12, FOLATE, FERRITIN, TIBC, IRON, RETICCTPCT in the last 72 hours. No results for input(s): LIPASE, AMYLASE in the last 72 hours.  Urine Studies No results for input(s): UHGB, CRYS in the last 72 hours.  Invalid input(s): UACOL, UAPR, USPG, UPH, UTP, UGL, UKET, UBIL, UNIT, UROB, ULEU, UEPI, UWBC, URBC, UBAC, CAST, UCOM, BILUA  MICROBIOLOGY: No results found for this or any previous visit (from the past 240 hour(s)).  RADIOLOGY STUDIES/RESULTS: Dg Wrist Complete Right  08/28/2014   CLINICAL DATA:  Fall while walking this afternoon. Initial evaluation.  EXAM: RIGHT  WRIST - COMPLETE 3+ VIEW  COMPARISON:  None.  FINDINGS: Comminuted posterior angulated displaced distal right radial fracture with extension to the radiocarpal joint space noted. Displaced ulnar styloid fracture.  IMPRESSION: Comminuted displaced posteriorly angulated fracture of the distal radius with extension to the radiocarpal joint space. Displaced ulnar styloid fracture.   Electronically Signed   By: Marcello Moores  Register   On: 08/28/2014 18:48   Dg Hip Complete Right  08/28/2014   CLINICAL DATA:  Fall wall wall and this afternoon. Right lateral hip pain. Initial encounter.  EXAM: RIGHT HIP - COMPLETE 2+ VIEW  COMPARISON:  None.  FINDINGS: There is a right femoral neck fracture with varus angulation. No subluxation or dislocation. Left  hip and SI joints are unremarkable.  IMPRESSION: Right femoral neck fracture with varus angulation.   Electronically Signed   By: Rolm Baptise M.D.   On: 08/28/2014 18:48   Dg Hip Operative Right  08/29/2014   CLINICAL DATA:  Right femoral neck fracture. Arthroplasty. Initial encounter.  EXAM: DG OPERATIVE RIGHT HIP 1-2 VIEWS  TECHNIQUE: Fluoroscopic spot image(s) were submitted for interpretation post-operatively.  COMPARISON:  Radiographs 08/28/2014.  FINDINGS: Two spot fluoroscopic images of the right hip demonstrate interval resection of the right femoral head and bipolar hemiarthroplasty. The hardware appears well positioned. There is no evidence of acute fracture or dislocation. There is gas within the operative bed.  IMPRESSION: Intraoperative views following right hip hemiarthroplasty. No demonstrated complication.   Electronically Signed   By: Camie Patience M.D.   On: 08/29/2014 20:12   Pelvis Portable  08/29/2014   CLINICAL DATA:  Status post right hip replacement after intertrochanteric fracture.  EXAM: PORTABLE PELVIS 1-2 VIEWS  COMPARISON:  Plain films of right hip 08/28/2014.  FINDINGS: Bipolar right hip hemiarthroplasty is in place. The device is located. No  fracture is seen. Surgical staples and gas in the soft tissues are noted.  IMPRESSION: Right hip replacement without evidence of complication.   Electronically Signed   By: Inge Rise M.D.   On: 08/29/2014 23:40   Dg Chest Port 1 View  09/21/2014   CLINICAL DATA:  Initial evaluation for weakness and shortness of breath for 2 days, right hip surgery and right hand surgery on November 12th, personal history of controlled hypertension  EXAM: PORTABLE CHEST - 1 VIEW  COMPARISON:  08/28/2014  FINDINGS: Mild to moderate cardiac enlargement. Moderate vascular congestion. Stable aortic arch calcification. Bilateral perihilar vessels are indistinct. Mild blunting left costophrenic angle suggests tiny effusion.  IMPRESSION: Suspect mild pulmonary edema. Limited evaluation of retrocardiac area with opacity and blunting of the costophrenic angle. Possibility of left lower lobe pneumonia not excluded on single frontal view.   Electronically Signed   By: Skipper Cliche M.D.   On: 09/21/2014 16:00   Dg Chest Port 1 View  08/28/2014   CLINICAL DATA:  Preop evaluation following hip fracture, hypertension  EXAM: PORTABLE CHEST - 1 VIEW  COMPARISON:  07/16/2009  FINDINGS: Cardiac shadow is stable. The lungs are again hyperinflated. Mild increase in central vascular congestion is noted. Mild scarring is noted in the left lung base. No focal confluent infiltrate is seen.  IMPRESSION: Mild vascular congestion.  No other acute abnormality is noted   Electronically Signed   By: Inez Catalina M.D.   On: 08/28/2014 19:21   Dg Femur Right Port  08/28/2014   CLINICAL DATA:  Fall with right hip pain.  EXAM: PORTABLE RIGHT FEMUR - 2 VIEW  COMPARISON:  Right hip same day.  FINDINGS: Images of the right femur excluding the right hip demonstrate no additional fracture sites. Remainder the exam is unremarkable.  IMPRESSION: No acute findings.   Electronically Signed   By: Marin Olp M.D.   On: 08/28/2014 20:37     Oren Binet, MD  [facility] Pager:[phone]  If 7PM-7AM, please contact night-coverage [email or web address] Password [email or web address] 09/22/2014, 10:57 AM   LOS: 1 day

## 2014-09-23 ENCOUNTER — Inpatient Hospital Stay (HOSPITAL_COMMUNITY): Payer: Medicare Other

## 2014-09-23 DIAGNOSIS — G459 Transient cerebral ischemic attack, unspecified: Secondary | ICD-10-CM

## 2014-09-23 DIAGNOSIS — I10 Essential (primary) hypertension: Secondary | ICD-10-CM

## 2014-09-23 DIAGNOSIS — R208 Other disturbances of skin sensation: Secondary | ICD-10-CM

## 2014-09-23 DIAGNOSIS — R569 Unspecified convulsions: Secondary | ICD-10-CM

## 2014-09-23 DIAGNOSIS — R200 Anesthesia of skin: Secondary | ICD-10-CM | POA: Insufficient documentation

## 2014-09-23 LAB — CBC
HCT: 37.4 % (ref 36.0–46.0)
HEMOGLOBIN: 12.1 g/dL (ref 12.0–15.0)
MCH: 29.6 pg (ref 26.0–34.0)
MCHC: 32.4 g/dL (ref 30.0–36.0)
MCV: 91.4 fL (ref 78.0–100.0)
Platelets: 158 10*3/uL (ref 150–400)
RBC: 4.09 MIL/uL (ref 3.87–5.11)
RDW: 14.4 % (ref 11.5–15.5)
WBC: 5.6 10*3/uL (ref 4.0–10.5)

## 2014-09-23 LAB — BASIC METABOLIC PANEL
Anion gap: 15 (ref 5–15)
BUN: 12 mg/dL (ref 6–23)
CALCIUM: 8.6 mg/dL (ref 8.4–10.5)
CHLORIDE: 103 meq/L (ref 96–112)
CO2: 24 meq/L (ref 19–32)
Creatinine, Ser: 0.9 mg/dL (ref 0.50–1.10)
GFR calc Af Amer: 68 mL/min — ABNORMAL LOW (ref >90)
GFR calc non Af Amer: 59 mL/min — ABNORMAL LOW (ref >90)
GLUCOSE: 74 mg/dL (ref 70–99)
Potassium: 3.5 mEq/L — ABNORMAL LOW (ref 3.7–5.3)
Sodium: 142 mEq/L (ref 137–147)

## 2014-09-23 LAB — GLUCOSE, CAPILLARY: Glucose-Capillary: 82 mg/dL (ref 70–99)

## 2014-09-23 LAB — LEGIONELLA ANTIGEN, URINE

## 2014-09-23 LAB — HEMOGLOBIN A1C
Hgb A1c MFr Bld: 5.5 % (ref <5.7)
Mean Plasma Glucose: 111 mg/dL (ref <117)

## 2014-09-23 MED ORDER — POTASSIUM CHLORIDE CRYS ER 20 MEQ PO TBCR
20.0000 meq | EXTENDED_RELEASE_TABLET | Freq: Every day | ORAL | Status: DC
  Administered 2014-09-24 – 2014-09-26 (×3): 20 meq via ORAL
  Filled 2014-09-23 (×2): qty 1
  Filled 2014-09-23: qty 2

## 2014-09-23 MED ORDER — SODIUM CHLORIDE 0.9 % IV SOLN
INTRAVENOUS | Status: DC
  Administered 2014-09-23: 13:00:00 via INTRAVENOUS

## 2014-09-23 MED ORDER — FUROSEMIDE 40 MG PO TABS: 40.0000 mg | ORAL_TABLET | Freq: Every day | ORAL | Status: DC

## 2014-09-23 MED ORDER — LEVOFLOXACIN 750 MG PO TABS
750.0000 mg | ORAL_TABLET | ORAL | Status: DC
  Administered 2014-09-23 – 2014-09-25 (×2): 750 mg via ORAL
  Filled 2014-09-23 (×4): qty 1

## 2014-09-23 NOTE — Code Documentation (Signed)
Called by Dr. Sloan Leiter regarding patient with stroke like symptoms.  Dr. Nicole Kindred had been alerted by Dr. Sloan Leiter.  I spoke with RN Sheppard Evens and advised to call the code stroke.  Code stroke called at 1313.  Patient alert and oriented.  According to staff she was working with PT - sat up on side of bed - had syncopal episode - BP dropped - patient had onset of of left hand numbness and tingling and some weakness - no drift - NIHSS 1 for left hand numbness and tingling.  CBG 80.  Bp now 135/75 SR 78.    To CT scan- tol well.  No other deficits.  Dr. Nicole Kindred here - no acute treatment .  In acute treatment window until 1715 - RN Erasmo Downer notified re: q 30 min neuro checks and 15 minute vital signs.  Handoff to Erasmo Downer - she understands to repage the code stroke if patient worsens while in time frame of acute treatment.

## 2014-09-23 NOTE — Progress Notes (Signed)
OT Cancellation Note  Patient Details Name: Kayla Kent MRN: [id number] DOB: [birth year]   Cancelled Treatment:    Reason Eval/Treat Not Completed: Medical issues which prohibited therapy (code stroke called. will assess in am.)  [facility], OTR/L  [phone] 09/23/2014 09/23/2014, 5:24 PM

## 2014-09-23 NOTE — Progress Notes (Addendum)
PT Note Orthostatic BPs  Supine 159/57  Sitting 137/47  Standing 99/33 with pt symptomatic having to lie down  Standing after 3 min Unable due to pt too dizzy  Orthostatic during treatment today. MD aware.  See full note for details.  Also notified nursing of left UE tingling at end of session and nurse to call MD.  Thanks. [place] [phone] (pager)

## 2014-09-23 NOTE — Progress Notes (Signed)
Physical Therapy Treatment Patient Details Name: Kayla Kent MRN: [id number] DOB: 09-04-1932 Today's Date: 09/23/2014    History of Present Illness With recent right hip fracture and right distal radius fracture (dc'd from acute care 11/14), chronic diastolic heart failure who comes to the emergency room with shortness of breath starting this morning. She had gone to short-term skilled nursing facility after surgical repair of her right hip and was on Lovenox at that time. She was subsequently discharged to her daughter's house. Several days ago, she started having a cough productive of yellow sputum. She started feeling weak and had difficulty bearing weight on her left leg. She woke up suddenly today with dyspnea. Worse with lying supine.  When EMS arrived, her oxygen saturations were reportedly in the 70s on room air..  Pt had episode after PT on Mon 09/23/14 with tingling in left UE.  Nursing and MD notified and MD ordered code stroke.      PT Comments    Pt admitted with above. Pt currently with functional limitations due to balance and endurance deficits as well as orthostasis limiting treatment today. MD came in and was aware of orthostasis.  At end of session, pt having tingling left UE and nursing notified and she called MD.  Pt's daughter had a lot of questions about pts' d/c recommendations and after discussion, it was decided that daughter does not want short term NHP unless pt absolutely has to go.  Explained to pt and daughter that HHPT may be beneficial for first two weeks and then transition to [place] PT to give pt time to recover once home prior to getting out of house. PT and daughter in agreement. Of course, if medical status has changed after treatment today given orthostasis and tingling left UE, may need to change d/c plans accordingly.  Will reassess next visit and advise as able.  Pt will benefit from skilled PT to increase their independence and safety with mobility to allow  discharge to the venue listed below.   Follow Up Recommendations  Home health PT;Supervision - Intermittent (Initially HH and transition to Outpt PT after 2 weeks)     Equipment Recommendations  None recommended by PT    Recommendations for Other Services       Precautions / Restrictions Precautions Precautions: Fall Precaution Comments: monitor impulsivity Required Braces or Orthoses: Sling Restrictions Weight Bearing Restrictions: Yes RUE Weight Bearing: Weight bearing as tolerated RLE Weight Bearing: Weight bearing as tolerated Other Position/Activity Restrictions: monitor wb through RUE if off platform    Mobility  Bed Mobility Overal bed mobility: Needs Assistance Bed Mobility: Supine to Sit;Sit to Supine     Supine to sit: Min assist Sit to supine: Mod assist;Max assist;+2 for physical assistance   General bed mobility comments: Cues for technique and close monitor for NWB R wrist.  Did not need much assist to get up.  However had episode of lightheadedness therefore took 2 people to lie pt down as pt was leaning forward - did not lose consciousness but was limp.    Transfers Overall transfer level: Needs assistance Equipment used: Right platform walker Transfers: Sit to/from Stand Sit to Stand: Min assist;Min guard;+2 physical assistance         General transfer comment: min assist to rise to stand and to descend to bed with cues for hand placement.    Ambulation/Gait                 Stairs  Wheelchair Mobility    Modified Rankin (Stroke Patients Only)       Balance Overall balance assessment: Needs assistance;History of Falls Sitting-balance support: No upper extremity supported;Feet supported Sitting balance-Leahy Scale: Fair     Standing balance support: Bilateral upper extremity supported;During functional activity Standing balance-Leahy Scale: Fair Standing balance comment: Can stand statically and wash self without UE  support.                      Cognition Arousal/Alertness: Awake/alert Behavior During Therapy: WFL for tasks assessed/performed;Impulsive Overall Cognitive Status: Within Functional Limits for tasks assessed                      Exercises      General Comments General comments (skin integrity, edema, etc.): Pt stated she had urinated in her Depends upon PT arrival.  Determined pt should stand and change Depends.  Pt able to put Depends on and shoes independently.  Then pt stood with some assist and was able to clean herself with clothes and pull up her new Depends on her own as well.  Pt had the Depends pulled up and PT was changing pts gown when pt began to sit down.  PT asked pt to stay up as bed pads were wet but pt proceeded to sit.  noted pt was slumping somewhat and asked pt what was wrong and pt not answering.  Pt assisted back to supine and pt had eyes open and immediately said she had felt dizzy.  Obtained BP monitor and found pt to be orthostatic once all BP's were taken.  MD came in during BP monitoring and was made aware of pt BP issues.  At end of orthostatic pressures, pt again felt dizzy therefore, PT assisted pt to supine position.  Pt had gotten lunch therefore brought lunch to pt and pt c/o tingling in her left UE.  Nursing had come to hand fluids and was notified of the pt symptoms of tingling.  Nursing called MD regarding these new symptoms.        Pertinent Vitals/Pain Pain Assessment: Faces Faces Pain Scale: Hurts a little bit Pain Location: right hip Pain Descriptors / Indicators: Grimacing Pain Intervention(s): Limited activity within patient's tolerance;Monitored during session;Repositioned  Orthostatic BPs  Supine 159/57  Sitting 137/47  Standing 99/33 with pt symptomatic having to lie down  Standing after 3 min Unable due to pt too dizzy      Home Living                      Prior Function            PT Goals (current goals can  now be found in the care plan section) Progress towards PT goals: Not progressing toward goals - comment (orthostasis and other symptoms)    Frequency  Min 5X/week    PT Plan Current plan remains appropriate    Co-evaluation             End of Session Equipment Utilized During Treatment: Gait belt Activity Tolerance: Patient limited by fatigue Patient left: in bed;with call bell/phone within reach;with family/visitor present     Time: 5462-7035 PT Time Calculation (min) (ACUTE ONLY): 25 min  Charges:  $Therapeutic Activity: 8-22 mins $Self Care/Home Management: 8-22                    G Codes:      Kayla Kent, Kayla Hoey  [place] 09/23/2014, 2:07 PM Kayla Kent [facility] Acute Rehabilitation [phone] (pager)

## 2014-09-23 NOTE — Progress Notes (Addendum)
Informed by RN that patient having some tingling numbness of her left hand that started around 12:40 pm. I subsequently evaluated patient, she has no other symptoms-some mild weakness of her left hand only 4/5-good strength in all the other extremities. Speech is clear and fluent.She is somewhat anxious. Will check CT Head stat. Neurology (Dr Nicole Kindred) and rapid response team notified. CBG around 80.Will continue to monitor closely.

## 2014-09-23 NOTE — Progress Notes (Addendum)
PATIENT DETAILS Name: Kayla Kent Age: 80 y.o. Sex: female Date of Birth: Jun 06, 1932 Admit Date: 09/21/2014 Admitting Physician Delfina Redwood, MD WIO:[id number] Jenny Reichmann, MD  Subjective: Back to baseline. No complaints of SOB.However "dizzy" when she stands up  Assessment/Plan: Principal Problem:   Acute respiratory failure:Resolved,suspect secondary to acute diastolic CHF.? PNA, repeat CXR on 12/7 showed infiltrate vs Atelectasis on left lower lung.  Blood cultures negative, since some suspicion for PNA, will stop Vanco/Cefepime and transition to Levaquin. VQ scan and Lower ext Doppler neg for DVT. 2  Active Problems:   Acute on chronic diastolic heart failure:compensated, change to oral Lasix-but will hold given orthostatics, Echo pending-suspect can have PCP follow it-as technical issues-cards unable to read Echo for last 3 days or so   Suspected HCAP: started on IV Vanco/Cefepime-repeat CXR as above, blood cultures negative, will transition to oral Levaquin for 5 more days.  . No fever and non toxic appearing.    Orthostatic hypotension: stop Lasix and Atenolol, recheck orthostatics in am.Gently hydrate today   HLD (hyperlipidemia):c/w crestor    Anxiety:stable-continue Xanax    Essential hypertension:moderate control with Atenolol, Cardizem and Lasi    Seizures:c/w Keppra    Recent recent right hip fracture and right distal radius fracture:PT eval  Disposition: Remain inpatient  Antibiotics:  See below   Anti-infectives    Start     Dose/Rate Route Frequency Ordered Stop   09/23/14 1000  levofloxacin (LEVAQUIN) tablet 750 mg     750 mg Oral Every 48 hours 09/23/14 0949 09/29/14 0959   09/22/14 0900  vancomycin (VANCOCIN) 500 mg in sodium chloride 0.9 % 100 mL IVPB  Status:  Discontinued     500 mg100 mL/hr over 60 Minutes Intravenous Every 12 hours 09/21/14 2023 09/23/14 0948   09/21/14 2200  ceFEPIme (MAXIPIME) 1 g in dextrose 5 % 50 mL IVPB  Status:   Discontinued     1 g100 mL/hr over 30 Minutes Intravenous Every 12 hours 09/21/14 2012 09/23/14 0948   09/21/14 2100  vancomycin (VANCOCIN) IVPB 750 mg/150 ml premix     750 mg150 mL/hr over 60 Minutes Intravenous  Once 09/21/14 2023 09/21/14 2335   09/21/14 1900  levofloxacin (LEVAQUIN) IVPB 750 mg  Status:  Discontinued     750 mg100 mL/hr over 90 Minutes Intravenous Every 48 hours 09/21/14 1748 09/21/14 2012   09/21/14 1730  vancomycin (VANCOCIN) 1,250 mg in sodium chloride 0.9 % 250 mL IVPB  Status:  Discontinued     1,250 mg166.7 mL/hr over 90 Minutes Intravenous  Once 09/21/14 1722 09/21/14 2021      DVT Prophylaxis: Prophylactic Lovenox   Code Status: Full code  Family Communication Daughter at bedside  Procedures:  None  CONSULTS:  None  MEDICATIONS: Scheduled Meds: . antiseptic oral rinse  7 mL Mouth Rinse BID  . aspirin  325 mg Oral Daily  . atenolol  50 mg Oral Daily  . diltiazem  180 mg Oral Daily  . docusate sodium  100 mg Oral BID  . enoxaparin  40 mg Subcutaneous QHS  . furosemide  40 mg Intravenous Once  . furosemide  40 mg Oral Daily  . levETIRAcetam  250 mg Oral BID  . levofloxacin  750 mg Oral Q48H  . potassium chloride  20 mEq Oral Daily  . rosuvastatin  5 mg Oral QHS  . sodium chloride  3 mL Intravenous Q12H  . sodium chloride  3 mL  Intravenous Q12H   Continuous Infusions:  PRN Meds:.sodium chloride, acetaminophen, albuterol, ALPRAZolam, guaiFENesin-dextromethorphan, HYDROcodone-acetaminophen, ondansetron **OR** ondansetron (ZOFRAN) IV, polyethylene glycol, sodium chloride, zolpidem    PHYSICAL EXAM: Vital signs in last 24 hours: Filed Vitals:   09/22/14 1520 09/22/14 1600 09/22/14 2035 09/23/14 0508  BP: 120/34  129/50 147/45  Pulse: 62  66 68  Temp: 98.4 F (36.9 C)  98.5 F (36.9 C) 98.7 F (37.1 C)  TempSrc: Oral  Oral Oral  Resp: 18  18 18   Height:      Weight:    53.7 kg (118 lb 6.2 oz)  SpO2: 88% 95% 93% 92%    Weight  change: 1.173 kg (2 lb 9.4 oz) Filed Weights   09/21/14 1900 09/22/14 0538 09/23/14 0508  Weight: 52.527 kg (115 lb 12.8 oz) 53.524 kg (118 lb) 53.7 kg (118 lb 6.2 oz)   Body mass index is 19.12 kg/(m^2).   Gen Exam: Awake and alert with clear speech.   Neck: Supple, No JVD.   Chest: B/L Clear.   CVS: S1 S2 Regular, no murmurs.  Abdomen: soft, BS +, non tender, non distended.  Extremities: no edema, lower extremities warm to touch. Neurologic: Non Focal.   Skin: No Rash.   Wounds: N/A.    Intake/Output from previous day: No intake or output data in the 24 hours ending 09/23/14 1219   LAB RESULTS: CBC  Recent Labs Lab 09/23/14 0444  WBC 5.6  HGB 12.1  HCT 37.4  PLT 158  MCV 91.4  MCH 29.6  MCHC 32.4  RDW 14.4    Chemistries   Recent Labs Lab 09/21/14 1408 09/23/14 0444  NA 144 142  K 4.2 3.5*  CL 106 103  CO2 23 24  GLUCOSE 112* 74  BUN 13 12  CREATININE 0.80 0.90  CALCIUM 9.2 8.6    CBG: No results for input(s): GLUCAP in the last 168 hours.  GFR Estimated Creatinine Clearance: 42.3 mL/min (by C-G formula based on Cr of 0.9).  Coagulation profile No results for input(s): INR, PROTIME in the last 168 hours.  Cardiac Enzymes No results for input(s): CKMB, TROPONINI, MYOGLOBIN in the last 168 hours.  Invalid input(s): CK  Invalid input(s): POCBNP No results for input(s): DDIMER in the last 72 hours. No results for input(s): HGBA1C in the last 72 hours. No results for input(s): CHOL, HDL, LDLCALC, TRIG, CHOLHDL, LDLDIRECT in the last 72 hours. No results for input(s): TSH, T4TOTAL, T3FREE, THYROIDAB in the last 72 hours.  Invalid input(s): FREET3 No results for input(s): VITAMINB12, FOLATE, FERRITIN, TIBC, IRON, RETICCTPCT in the last 72 hours. No results for input(s): LIPASE, AMYLASE in the last 72 hours.  Urine Studies No results for input(s): UHGB, CRYS in the last 72 hours.  Invalid input(s): UACOL, UAPR, USPG, UPH, UTP, UGL, UKET, UBIL,  UNIT, UROB, ULEU, UEPI, UWBC, URBC, UBAC, CAST, UCOM, BILUA  MICROBIOLOGY: Recent Results (from the past 240 hour(s))  Blood culture (routine x 2)     Status: None (Preliminary result)   Collection Time: 09/21/14  5:41 PM  Result Value Ref Range Status   Specimen Description BLOOD LEFT FOREARM  Final   Special Requests BOTTLES DRAWN AEROBIC AND ANAEROBIC 5CC  Final   Culture  Setup Time   Final    09/22/2014 03:55 Performed at [facility]    Culture   Final           BLOOD CULTURE RECEIVED NO GROWTH TO DATE CULTURE WILL BE  HELD FOR 5 DAYS BEFORE ISSUING A FINAL NEGATIVE REPORT Performed at [facility]    Report Status PENDING  Incomplete  Blood culture (routine x 2)     Status: None (Preliminary result)   Collection Time: 09/21/14  5:50 PM  Result Value Ref Range Status   Specimen Description BLOOD LEFT HAND  Final   Special Requests BOTTLES DRAWN AEROBIC ONLY 3CC  Final   Culture  Setup Time   Final    09/22/2014 03:55 Performed at [facility]    Culture   Final           BLOOD CULTURE RECEIVED NO GROWTH TO DATE CULTURE WILL BE HELD FOR 5 DAYS BEFORE ISSUING A FINAL NEGATIVE REPORT Performed at [facility]    Report Status PENDING  Incomplete    RADIOLOGY STUDIES/RESULTS: Dg Chest 2 View  09/23/2014   CLINICAL DATA:  Shortness of breath.  EXAM: CHEST  2 VIEW  COMPARISON:  09/21/2014.  08/28/2014.  07/16/2009.  FINDINGS: Mediastinum and hilar structures are normal. Cardiomegaly with normal pulmonary vascularity. Bibasilar atelectasis with left pleural effusion. Apical pleural thickening consistent with scarring.No pneumothorax. Cardiomegaly with normal pulmonary vascularity. No acute bony abnormality. Thoracic spine osteopenia degenerative change.  IMPRESSION: 1. Basilar atelectasis with small left pleural effusion. 2. Cardiomegaly with normal pulmonary vascularity.   Electronically Signed   By: Marcello Moores  Register   On: 09/23/2014 08:15   Dg  Wrist Complete Right  08/28/2014   CLINICAL DATA:  Fall while walking this afternoon. Initial evaluation.  EXAM: RIGHT WRIST - COMPLETE 3+ VIEW  COMPARISON:  None.  FINDINGS: Comminuted posterior angulated displaced distal right radial fracture with extension to the radiocarpal joint space noted. Displaced ulnar styloid fracture.  IMPRESSION: Comminuted displaced posteriorly angulated fracture of the distal radius with extension to the radiocarpal joint space. Displaced ulnar styloid fracture.   Electronically Signed   By: Marcello Moores  Register   On: 08/28/2014 18:48   Dg Hip Complete Right  08/28/2014   CLINICAL DATA:  Fall wall wall and this afternoon. Right lateral hip pain. Initial encounter.  EXAM: RIGHT HIP - COMPLETE 2+ VIEW  COMPARISON:  None.  FINDINGS: There is a right femoral neck fracture with varus angulation. No subluxation or dislocation. Left hip and SI joints are unremarkable.  IMPRESSION: Right femoral neck fracture with varus angulation.   Electronically Signed   By: Rolm Baptise M.D.   On: 08/28/2014 18:48   Dg Hip Operative Right  08/29/2014   CLINICAL DATA:  Right femoral neck fracture. Arthroplasty. Initial encounter.  EXAM: DG OPERATIVE RIGHT HIP 1-2 VIEWS  TECHNIQUE: Fluoroscopic spot image(s) were submitted for interpretation post-operatively.  COMPARISON:  Radiographs 08/28/2014.  FINDINGS: Two spot fluoroscopic images of the right hip demonstrate interval resection of the right femoral head and bipolar hemiarthroplasty. The hardware appears well positioned. There is no evidence of acute fracture or dislocation. There is gas within the operative bed.  IMPRESSION: Intraoperative views following right hip hemiarthroplasty. No demonstrated complication.   Electronically Signed   By: Camie Patience M.D.   On: 08/29/2014 20:12   Pelvis Portable  08/29/2014   CLINICAL DATA:  Status post right hip replacement after intertrochanteric fracture.  EXAM: PORTABLE PELVIS 1-2 VIEWS  COMPARISON:   Plain films of right hip 08/28/2014.  FINDINGS: Bipolar right hip hemiarthroplasty is in place. The device is located. No fracture is seen. Surgical staples and gas in the soft tissues are noted.  IMPRESSION: Right hip replacement  without evidence of complication.   Electronically Signed   By: Inge Rise M.D.   On: 08/29/2014 23:40   Nm Pulmonary Perf And Vent  09/22/2014   CLINICAL DATA:  Hypoxia and shortness of breath. Multiple recent surgeries for trauma.  EXAM: NUCLEAR MEDICINE VENTILATION - PERFUSION LUNG SCAN  TECHNIQUE: Ventilation images were obtained in multiple projections using inhaled aerosol technetium 99 M DTPA. Perfusion images were obtained in multiple projections after intravenous injection of Tc-31m MAA.  RADIOPHARMACEUTICALS:  30 mCi Tc-49m DTPA aerosol and 99 mCi Tc-49m MAA  COMPARISON:  Chest radiograph same date  FINDINGS: Ventilation: Central clumping of the inhaled aerosolized radiotracer is noted as well as presumed swallowed activity within the distal esophagus. Allowing for this, no ventilation defect is identified. Mild air trapping at the bases is identified.  Perfusion: Decreased radiotracer intensity over the left lung base is identified likely indicating matched retrocardiac opacity as seen on prior exam. No focal segmental perfusion defect is identified.  IMPRESSION: Low probability for pulmonary embolism. Matched perfusion defect at the left lung base correlate with pulmonary parenchymal opacity, likely atelectasis or possibly pneumonia, as seen on the prior dissimilar exam appointment today.   Electronically Signed   By: Conchita Paris M.D.   On: 09/22/2014 12:06   Dg Chest Port 1 View  09/21/2014   CLINICAL DATA:  Initial evaluation for weakness and shortness of breath for 2 days, right hip surgery and right hand surgery on November 12th, personal history of controlled hypertension  EXAM: PORTABLE CHEST - 1 VIEW  COMPARISON:  08/28/2014  FINDINGS: Mild to moderate  cardiac enlargement. Moderate vascular congestion. Stable aortic arch calcification. Bilateral perihilar vessels are indistinct. Mild blunting left costophrenic angle suggests tiny effusion.  IMPRESSION: Suspect mild pulmonary edema. Limited evaluation of retrocardiac area with opacity and blunting of the costophrenic angle. Possibility of left lower lobe pneumonia not excluded on single frontal view.   Electronically Signed   By: Skipper Cliche M.D.   On: 09/21/2014 16:00   Dg Chest Port 1 View  08/28/2014   CLINICAL DATA:  Preop evaluation following hip fracture, hypertension  EXAM: PORTABLE CHEST - 1 VIEW  COMPARISON:  07/16/2009  FINDINGS: Cardiac shadow is stable. The lungs are again hyperinflated. Mild increase in central vascular congestion is noted. Mild scarring is noted in the left lung base. No focal confluent infiltrate is seen.  IMPRESSION: Mild vascular congestion.  No other acute abnormality is noted   Electronically Signed   By: Inez Catalina M.D.   On: 08/28/2014 19:21   Dg Femur Right Port  08/28/2014   CLINICAL DATA:  Fall with right hip pain.  EXAM: PORTABLE RIGHT FEMUR - 2 VIEW  COMPARISON:  Right hip same day.  FINDINGS: Images of the right femur excluding the right hip demonstrate no additional fracture sites. Remainder the exam is unremarkable.  IMPRESSION: No acute findings.   Electronically Signed   By: Marin Olp M.D.   On: 08/28/2014 20:37    Oren Binet, MD  [facility] Pager:[phone]  If 7PM-7AM, please contact night-coverage [email or web address] Password [email or web address] 09/23/2014, 12:19 PM   LOS: 2 days

## 2014-09-23 NOTE — Care Management Note (Addendum)
    Page 1 of 2   09/26/2014     9:56:10 AM CARE MANAGEMENT NOTE 09/26/2014  Patient:  Kayla Kent, Kayla Kent   Account Number:  [id number]  Date Initiated:  09/23/2014  Documentation initiated by:  GRAVES-BIGELOW,Mychael Smock  Subjective/Objective Assessment:   Pt admitted for Weakness and Shortness of breath.  Pt with recent right hip fracture and right distal radius fracture. Pt will be going to daughters home at d/c.     Action/Plan:   CM made referral with [facility] and SOC to begin within 24-48 hrs. No furhter needs from CM at this time.   Anticipated DC Date:  09/23/2014   Anticipated DC Plan:  [place]  CM consult      [facility] Choice  HOME HEALTH   Choice offered to / List presented to:  C-1 Patient        [place] arranged  [place].   Status of service:  Completed, signed off Medicare Important Message given?  YES (If response is "NO", the following Medicare IM given date fields will be blank) Date Medicare IM given:  09/23/2014 Medicare IM given by:  GRAVES-BIGELOW,Nikalas Bramel Date Additional Medicare IM given:  09/26/2014 Additional Medicare IM given by:  [place]  Discharge Disposition:  [place]  Per UR Regulation:  Reviewed for med. necessity/level of care/duration of stay  If discussed at [place] of Stay Meetings, dates discussed:    Comments:  Eliquis 2.5- 5MG  BID $150- PRIOR AUTH IS NOT REQUIRED. CM did provide pt with 30 day free eliquis card. [place] does not have medication available. CM will call [facility] for Medication. Medication is available. [id number] 09-26-14 Jacqlyn Krauss, RN,BSN [phone]  Benefits check in for xarelto and eliquis. CM will provide 30 day free card. [place] 09-25-14 Jacqlyn Krauss, RN, BSN [phone]

## 2014-09-23 NOTE — Consult Note (Signed)
Referring Physician: Ghimire    Chief Complaint: Code stroke  HPI:                                                                                                                                         Kayla Kent is an 80 y.o. female who had suffered both a distal radius and hip fracture on August 28 2014 with surgical hip replacement on 08/29/2014. She had gone to short-term skilled nursing facility after surgical repair of her right hip and was on Lovenox at that time. She was subsequently discharged to her daughter's house where she received another week of Lovenox and then switched to full dose aspirin daily.  Patient was brought to ED on 09/21/2014 due to SOB and found to have questionable LLL PNA on CXR. VQ scan and LE doppler were negative for PE or DVT.  Today at 12:40 patient complained of left hand (only) decreased sensation and weakness of left hand. Code stroke was called. At time of arrival patient was alert and oriented but still complaining of left hand decreased sensation and mild weakness.  Patient was sent to CT scan and on arrival stated her strength was improving but still had decreased sensation on left hand.   Date last known well: Date: 09/23/2014 Time last known well: Time: 12:40 tPA Given: No: minimal symptoms which currently is improving with NIHSS1  Past Medical History  Diagnosis Date  . Anxiety   . Paroxysmal supraventricular tachycardia   . Uterine prolapse without mention of vaginal wall prolapse   . HTN (hypertension)   . HLD (hyperlipidemia)   . Intolerance of drug     orthostatic  . PVD (peripheral vascular disease)   . Allergic rhinitis   . Syncope and collapse     Past Surgical History  Procedure Laterality Date  . Corrective eye surgery      as a child  . Cholecystectomy    . Tonsillectomy    . Ivd removed    . Stress cardiolite  08/05/93  . Rf ablation psvt      summer '10  . Total hip arthroplasty Right 08/29/2014    Procedure: Right  Hip Hemi Arthroplasty;  Surgeon: Marianna Payment, MD;  Location: [facility];  Service: Orthopedics;  Laterality: Right;  Hip procedure 1st wants [employer], [employer], [place]. Wrist - wants [employer].  Surgeon available at 1645.  Surgeon to inform sales reps.  Marland Kitchen Open reduction internal fixation (orif) distal radial fracture Right 08/29/2014    Procedure: OPEN REDUCTION INTERNAL FIXATION (ORIF) DISTAL RADIAL FRACTURE;  Surgeon: Marianna Payment, MD;  Location: [facility];  Service: Orthopedics;  Laterality: Right;    Family History  Problem Relation Age of Onset  . Coronary artery disease      family hx (also positive for lipids)  . Hypertension      family hx  .  Colon cancer Neg Hx   . Breast cancer Neg Hx   . Diabetes Neg Hx   . Mental illness Father     suicide  . Arthritis Father   . Hyperlipidemia Sister   . Hypertension Sister   . Heart disease Brother     CAD/MI  . Hypertension Brother    Social History:  reports that she quit smoking about 58 years ago. She has never used smokeless tobacco. She reports that she does not drink alcohol or use illicit drugs.  Allergies:  Allergies  Allergen Reactions  . Chocolate   . Codeine   . Diazepam   . Fruit & Vegetable Daily [Nutritional Supplements] Hives and Swelling    peaches  . Iodine   . Iohexol      Code: HIVES, Desc: pt gets 13 hr pre-meds, Onset Date: [id number]   . Latex Hives  . Peach [Prunus Persica]   . Peanut-Containing Drug Products Hives and Swelling  . Penicillins Hives, Itching and Swelling  . Strawberry   . Sulfonamide Derivatives   . Wheat     Shortness of breath    Medications:                                                                                                                           Scheduled: . antiseptic oral rinse  7 mL Mouth Rinse BID  . aspirin  325 mg Oral Daily  . diltiazem  180 mg Oral Daily  . docusate sodium  100 mg Oral BID  . enoxaparin  40 mg Subcutaneous QHS  . furosemide   40 mg Intravenous Once  . levETIRAcetam  250 mg Oral BID  . levofloxacin  750 mg Oral Q48H  . potassium chloride  20 mEq Oral Daily  . rosuvastatin  5 mg Oral QHS  . sodium chloride  3 mL Intravenous Q12H  . sodium chloride  3 mL Intravenous Q12H    ROS:                                                                                                                                       History obtained from the patient  General ROS: negative for - chills, fatigue, fever, night sweats, weight gain or weight loss Psychological ROS: negative for - behavioral disorder, hallucinations, memory difficulties, mood swings or suicidal ideation Ophthalmic ROS: negative for -  blurry vision, double vision, eye pain or loss of vision ENT ROS: negative for - epistaxis, nasal discharge, oral lesions, sore throat, tinnitus or vertigo Allergy and Immunology ROS: negative for - hives or itchy/watery eyes Hematological and Lymphatic ROS: negative for - bleeding problems, bruising or swollen lymph nodes Endocrine ROS: negative for - galactorrhea, hair pattern changes, polydipsia/polyuria or temperature intolerance Respiratory ROS: negative for - cough, hemoptysis, shortness of breath or wheezing Cardiovascular ROS: negative for - chest pain, dyspnea on exertion, edema or irregular heartbeat Gastrointestinal ROS: negative for - abdominal pain, diarrhea, hematemesis, nausea/vomiting or stool incontinence Genito-Urinary ROS: negative for - dysuria, hematuria, incontinence or urinary frequency/urgency Musculoskeletal ROS: negative for - joint swelling or muscular weakness Neurological ROS: as noted in HPI Dermatological ROS: negative for rash and skin lesion changes  Neurologic Examination:                                                                                                      Blood pressure 147/45, pulse 68, temperature 98.7 F (37.1 C), temperature source Oral, resp. rate 18, height 5\' 6"  (1.676  m), weight 53.7 kg (118 lb 6.2 oz), SpO2 92 %.   Physical Exam  Constitutional: He appears well-developed and well-nourished.  Psych: Affect appropriate to situation Eyes: No scleral injection HENT: No OP obstrucion Head: Normocephalic.  Cardiovascular: Normal rate and regular rhythm.  Respiratory: Effort normal and breath sounds normal.  GI: Soft. Bowel sounds are normal. No distension. There is no tenderness.  Skin: WDI  Neuro Exam: Mental Status: Alert, oriented, thought content appropriate.  Speech fluent without evidence of aphasia.  Able to follow 3 step commands without difficulty. 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  Deep Tendon Reflexes:  Right: Upper Extremity   Left: Upper extremity   biceps (C-5 to C-6) 2/4   biceps (C-5 to C-6) 2/4 tricep (C7) 2/4    triceps (C7) 2/4 Brachioradialis (C6) 2/4  Brachioradialis (C6) 2/4  Lower Extremity Lower Extremity  quadriceps (L-2 to L-4) 1/4   quadriceps (L-2 to L-4) 1/4 Achilles (S1) 0/4   Achilles (S1) 0/4  Plantars: Mute bilaterally Cerebellar: normal finger-to-nose,   Gait: not tested due to safety.  CV: pulses palpable throughout    Lab Results: Basic Metabolic Panel:  Recent Labs Lab 09/21/14 1408 09/23/14 0444  NA 144  142  K 4.2 3.5*  CL 106 103  CO2 23 24  GLUCOSE 112* 74  BUN 13 12  CREATININE 0.80 0.90  CALCIUM 9.2 8.6    Liver Function Tests: No results for input(s): AST, ALT, ALKPHOS, BILITOT, PROT, ALBUMIN in the last 168 hours. No results for input(s): LIPASE, AMYLASE in the last 168 hours. No results for input(s): AMMONIA in the last 168 hours.  CBC:  Recent Labs Lab 09/23/14 0444  WBC 5.6  HGB 12.1  HCT 37.4  MCV 91.4  PLT 158    Cardiac Enzymes: No results for input(s): CKTOTAL, CKMB, CKMBINDEX, TROPONINI in the last 168 hours.  Lipid Panel: No results for input(s): CHOL, TRIG, HDL, CHOLHDL, VLDL, LDLCALC in the last 168 hours.  CBG:  Recent Labs Lab 09/23/14 1308  GLUCAP 82    Microbiology: Results for orders placed or performed during the hospital encounter of 09/21/14  Blood culture (routine x 2)     Status: None (Preliminary result)   Collection Time: 09/21/14  5:41 PM  Result Value Ref Range Status   Specimen Description BLOOD LEFT FOREARM  Final   Special Requests BOTTLES DRAWN AEROBIC AND ANAEROBIC 5CC  Final   Culture  Setup Time   Final    09/22/2014 03:55 Performed at [facility]    Culture   Final           BLOOD CULTURE RECEIVED NO GROWTH TO DATE CULTURE WILL BE HELD FOR 5 DAYS BEFORE ISSUING A FINAL NEGATIVE REPORT Performed at [facility]    Report Status PENDING  Incomplete  Blood culture (routine x 2)     Status: None (Preliminary result)   Collection Time: 09/21/14  5:50 PM  Result Value Ref Range Status   Specimen Description BLOOD LEFT HAND  Final   Special Requests BOTTLES DRAWN AEROBIC ONLY 3CC  Final   Culture  Setup Time   Final    09/22/2014 03:55 Performed at [facility]    Culture   Final           BLOOD CULTURE RECEIVED NO GROWTH TO DATE CULTURE WILL BE HELD FOR 5 DAYS BEFORE ISSUING A FINAL NEGATIVE REPORT Performed at [facility]    Report Status PENDING  Incomplete    Coagulation  Studies: No results for input(s): LABPROT, INR in the last 72 hours.  Imaging: Dg Chest 2 View  09/23/2014   CLINICAL DATA:  Shortness of breath.  EXAM: CHEST  2 VIEW  COMPARISON:  09/21/2014.  08/28/2014.  07/16/2009.  FINDINGS: Mediastinum and hilar structures are normal. Cardiomegaly with normal pulmonary vascularity. Bibasilar atelectasis with left pleural effusion. Apical pleural thickening consistent with scarring.No pneumothorax. Cardiomegaly with normal pulmonary vascularity. No acute bony abnormality. Thoracic spine osteopenia degenerative change.  IMPRESSION: 1. Basilar atelectasis with small left pleural effusion. 2. Cardiomegaly with normal pulmonary vascularity.   Electronically Signed   By: Marcello Moores  Register   On: 09/23/2014 08:15   Nm Pulmonary Perf And Vent  09/22/2014   CLINICAL DATA:  Hypoxia and shortness of breath. Multiple recent surgeries for trauma.  EXAM: NUCLEAR MEDICINE VENTILATION - PERFUSION LUNG SCAN  TECHNIQUE: Ventilation images were obtained in multiple projections using inhaled aerosol technetium 99 M DTPA. Perfusion images were obtained in multiple projections after intravenous injection of Tc-56m MAA.  RADIOPHARMACEUTICALS:  30 mCi Tc-48m DTPA aerosol and 99 mCi Tc-70m MAA  COMPARISON:  Chest radiograph same date  FINDINGS: Ventilation: Central clumping of the inhaled aerosolized radiotracer is noted as well as presumed swallowed activity within the distal esophagus.  Allowing for this, no ventilation defect is identified. Mild air trapping at the bases is identified.  Perfusion: Decreased radiotracer intensity over the left lung base is identified likely indicating matched retrocardiac opacity as seen on prior exam. No focal segmental perfusion defect is identified.  IMPRESSION: Low probability for pulmonary embolism. Matched perfusion defect at the left lung base correlate with pulmonary parenchymal opacity, likely atelectasis or possibly pneumonia, as seen on the prior  dissimilar exam appointment today.   Electronically Signed   By: Conchita Paris M.D.   On: 09/22/2014 12:06   Dg Chest Port 1 View  09/21/2014   CLINICAL DATA:  Initial evaluation for weakness and shortness of breath for 2 days, right hip surgery and right hand surgery on November 12th, personal history of controlled hypertension  EXAM: PORTABLE CHEST - 1 VIEW  COMPARISON:  08/28/2014  FINDINGS: Mild to moderate cardiac enlargement. Moderate vascular congestion. Stable aortic arch calcification. Bilateral perihilar vessels are indistinct. Mild blunting left costophrenic angle suggests tiny effusion.  IMPRESSION: Suspect mild pulmonary edema. Limited evaluation of retrocardiac area with opacity and blunting of the costophrenic angle. Possibility of left lower lobe pneumonia not excluded on single frontal view.   Electronically Signed   By: Skipper Cliche M.D.   On: 09/21/2014 16:00       Assessment and plan discussed with with attending physician and they are in agreement.    Etta Quill PA-C [facility] [phone]  09/23/2014, 1:23 PM   Assessment: 80 y.o. female with new onset of left hand decreased sensation and weakness.  Symptoms currently resolving.  CT head obtained and showed no acute abnormality. Given her stroke risk factors cannot rule out possible CVA.  tPA was not given due to NIHSS 1 and resolving minimal symptoms of left hand numbness. Patient would benefit from stroke work up.    Stroke Risk Factors - hyperlipidemia and hypertension  1. HgbA1c, fasting lipid panel 2. MRI, MRA  of the brain without contrast 3. PT consult, OT consult, Speech consult 4. Echocardiogram--(already done during current hospitalization) 5. Carotid dopplers 6. Prophylactic therapy-Antiplatelet med: Aspirin - dose 325 mg daily 7. Risk factor modification 8. Telemetry monitoring 9. Frequent neuro checks   I personally participated in this patient's evaluation and management, including  formulating above clinical impression and management recommendations.  Rush Farmer M.D. [facility] [phone]

## 2014-09-24 ENCOUNTER — Telehealth: Payer: Self-pay | Admitting: Internal Medicine

## 2014-09-24 DIAGNOSIS — G458 Other transient cerebral ischemic attacks and related syndromes: Secondary | ICD-10-CM

## 2014-09-24 LAB — LIPID PANEL
Cholesterol: 141 mg/dL (ref 0–200)
HDL: 30 mg/dL — ABNORMAL LOW (ref >39)
LDL CALC: 93 mg/dL (ref 0–99)
Total CHOL/HDL Ratio: 4.7 RATIO
Triglycerides: 89 mg/dL (ref <150)
VLDL: 18 mg/dL (ref 0–40)

## 2014-09-24 MED ORDER — ATENOLOL 25 MG PO TABS
12.5000 mg | ORAL_TABLET | Freq: Every day | ORAL | Status: DC
  Administered 2014-09-24 – 2014-09-25 (×2): 12.5 mg via ORAL
  Filled 2014-09-24 (×2): qty 1

## 2014-09-24 MED ORDER — BISACODYL 5 MG PO TBEC
5.0000 mg | DELAYED_RELEASE_TABLET | Freq: Every day | ORAL | Status: DC | PRN
  Administered 2014-09-24: 5 mg via ORAL
  Filled 2014-09-24: qty 1

## 2014-09-24 MED ORDER — ROSUVASTATIN CALCIUM 10 MG PO TABS
10.0000 mg | ORAL_TABLET | Freq: Every day | ORAL | Status: DC
  Administered 2014-09-24 – 2014-09-25 (×2): 10 mg via ORAL
  Filled 2014-09-24 (×2): qty 1

## 2014-09-24 NOTE — Progress Notes (Signed)
STROKE TEAM PROGRESS NOTE   HISTORY Kayla Kent is an 80 y.o. female who had suffered both a distal radius and hip fracture on August 28, 2014 with surgical hip replacement on 08/29/2014. She had gone to short-term skilled nursing facility after surgical repair of her right hip and was on Lovenox at that time. She was subsequently discharged to her daughter's house where she received another week of Lovenox and then switched to full dose aspirin daily. Patient was brought to ED on 09/21/2014 due to SOB and found to have questionable LLL PNA on CXR. VQ scan and LE doppler were negative for PE or DVT. Today 09/23/2014 at 12:40 patient complained of left hand (only) decreased sensation and weakness of left hand. Code stroke was called. At time of arrival to see pt, patient was alert and oriented but still complaining of left hand decreased sensation and mild weakness. Patient was sent to CT scan and on arrival stated her strength was improving but still had decreased sensation on left hand. Patient was not administered TPA secondary to minimal symptoms which currently is improving with NIHSS1.    SUBJECTIVE (INTERVAL HISTORY) No family is at the bedside.  Overall she feels her condition is completely resolved. Patient living with daughter just prior to admission - she is concerned that she is putting too much burden on her daughter and her family. States her daughter states she is worrying too much.   OBJECTIVE Temp:  [98.4 F (36.9 C)-99 F (37.2 C)] 99 F (37.2 C) (12/08 0400) Pulse Rate:  [42-82] 74 (12/08 0455) Cardiac Rhythm:  [-] Normal sinus rhythm (12/07 2000) Resp:  [11-24] 19 (12/08 0455) BP: (120-180)/(44-58) 124/53 mmHg (12/08 0455) SpO2:  [88 %-96 %] 92 % (12/08 0455)   Recent Labs Lab 09/23/14 1308  GLUCAP 82    Recent Labs Lab 09/21/14 1408 09/23/14 0444  NA 144 142  K 4.2 3.5*  CL 106 103  CO2 23 24  GLUCOSE 112* 74  BUN 13 12  CREATININE 0.80 0.90  CALCIUM  9.2 8.6   No results for input(s): AST, ALT, ALKPHOS, BILITOT, PROT, ALBUMIN in the last 168 hours.  Recent Labs Lab 09/23/14 0444  WBC 5.6  HGB 12.1  HCT 37.4  MCV 91.4  PLT 158   No results for input(s): CKTOTAL, CKMB, CKMBINDEX, TROPONINI in the last 168 hours. No results for input(s): LABPROT, INR in the last 72 hours.  Recent Labs  09/21/14 1532  COLORURINE YELLOW  LABSPEC 1.015  PHURINE 5.5  GLUCOSEU NEGATIVE  HGBUR TRACE*  BILIRUBINUR NEGATIVE  KETONESUR NEGATIVE  PROTEINUR 30*  UROBILINOGEN 0.2  NITRITE POSITIVE*  LEUKOCYTESUR NEGATIVE       Component Value Date/Time   CHOL 141 09/24/2014 0009   TRIG 89 09/24/2014 0009   HDL 30* 09/24/2014 0009   CHOLHDL 4.7 09/24/2014 0009   VLDL 18 09/24/2014 0009   LDLCALC 93 09/24/2014 0009   Lab Results  Component Value Date   HGBA1C 5.5 09/23/2014   No results found for: LABOPIA, COCAINSCRNUR, LABBENZ, AMPHETMU, THCU, LABBARB  No results for input(s): ETH in the last 168 hours.  Dg Chest 2 View  09/23/2014   CLINICAL DATA:  Shortness of breath.  EXAM: CHEST  2 VIEW  COMPARISON:  09/21/2014.  08/28/2014.  07/16/2009.  FINDINGS: Mediastinum and hilar structures are normal. Cardiomegaly with normal pulmonary vascularity. Bibasilar atelectasis with left pleural effusion. Apical pleural thickening consistent with scarring.No pneumothorax. Cardiomegaly with normal pulmonary vascularity. No acute  bony abnormality. Thoracic spine osteopenia degenerative change.  IMPRESSION: 1. Basilar atelectasis with small left pleural effusion. 2. Cardiomegaly with normal pulmonary vascularity.   Electronically Signed   By: Marcello Moores  Register   On: 09/23/2014 08:15   Ct Head Wo Contrast  09/23/2014   CLINICAL DATA:  Could stroke onset at 12:40 p.m. Left hand numbness and weakness.  EXAM: CT HEAD WITHOUT CONTRAST  TECHNIQUE: Contiguous axial images were obtained from the base of the skull through the vertex without intravenous contrast.   COMPARISON:  Brain MRI 02/02/2012 CT 05/07/1999 and  FINDINGS: No acute intracranial hemorrhage. No focal mass lesion. No CT evidence of acute infarction. No midline shift or mass effect. No hydrocephalus. Basilar cisterns are patent.  There is a remote infarction in the inferior frontotemporal lobe. There is mild periventricular white matter hypodensities.  IMPRESSION: 1. No acute cortical infarction by CT imaging. 2. No intracranial hemorrhage. 3. Remote right temporal lobe infarction. Findings conveyed toDr.  Nicole Kindred On 09/23/2014  at14:11.   Electronically Signed   By: Suzy Bouchard M.D.   On: 09/23/2014 14:10   Mr [name] Wo Contrast  09/23/2014   CLINICAL DATA:  80 year old female with left hand decreased sensation and weakness. Code stroke. Initial encounter. Current history of acute fractures in November treated with surgical hip replacement at that time.  EXAM: MRI HEAD WITHOUT CONTRAST  MRA HEAD WITHOUT CONTRAST  TECHNIQUE: Multiplanar, multiecho pulse sequences of the brain and surrounding structures were obtained without intravenous contrast. Angiographic images of the head were obtained using MRA technique without contrast.  COMPARISON:  Head CT without contrast 1328 hr today. Brain MRI and head and neck MRA 02/02/2012.  FINDINGS: MRI HEAD FINDINGS  Stable cerebral volume. Major intracranial vascular flow voids are stable. No restricted diffusion to suggest acute infarction. No midline shift, mass effect, evidence of mass lesion, ventriculomegaly, extra-axial collection or acute intracranial hemorrhage. Cervicomedullary junction and pituitary are within normal limits. Negative visualized cervical spine.  Scattered cerebral white matter T2 and FLAIR hyperintensity is stable. Chronic anterior right temporal lobe encephalomalacia and gliosis is stable. There is a small chronic lacunar infarct of the right caudate nucleus which is new since 2013. Elsewhere deep gray matter nuclei are stable. Brainstem  and cerebellum remain within normal limits for age.  Mild mastoid effusions have not significantly changed. Dysconjugate gaze again noted with otherwise negative orbits soft tissues. Paranasal sinuses are clear. Visible internal auditory structures appear normal. Visualized scalp soft tissues are within normal limits. Stable bone marrow signal, within normal limits.  MRA HEAD FINDINGS  Stable antegrade flow in the posterior circulation. No distal vertebral artery or basilar stenosis. Patent AICA and right PICA origins. SCA and PCA origins remain normal. Mild chronic left P2 segment stenosis is stable (series 506, image 12). Other PCA branches are within normal limits. Posterior communicating arteries are diminutive or absent.  Antegrade flow in both ICA siphons is stable. Ophthalmic artery origins are within normal limits. No siphon stenosis. Patent carotid termini. MCA and ACA origins are stable and within normal limits. Anterior communicating artery and visualized bilateral ACA branches remain normal. Visualized right MCA branches are stable and within normal limits.  Chronic left MCA M1 irregularity with mild to moderate stenosis just proximal to the left anterior temporal artery origin is stable (series 505, image 9). Visualized left MCA branches are otherwise within normal limits.  IMPRESSION: 1. No acute intracranial abnormality. Mild progression of small vessel ischemia since 2013. 2. Stable intracranial MRA,  with mild to moderate left PCA and left MCA stenoses with preserved distal flow.   Electronically Signed   By: Lars Pinks M.D.   On: 09/23/2014 20:33   Mr Brain Wo Contrast  09/23/2014   CLINICAL DATA:  80 year old female with left hand decreased sensation and weakness. Code stroke. Initial encounter. Current history of acute fractures in November treated with surgical hip replacement at that time.  EXAM: MRI HEAD WITHOUT CONTRAST  MRA HEAD WITHOUT CONTRAST  TECHNIQUE: Multiplanar, multiecho pulse  sequences of the brain and surrounding structures were obtained without intravenous contrast. Angiographic images of the head were obtained using MRA technique without contrast.  COMPARISON:  Head CT without contrast 1328 hr today. Brain MRI and head and neck MRA 02/02/2012.  FINDINGS: MRI HEAD FINDINGS  Stable cerebral volume. Major intracranial vascular flow voids are stable. No restricted diffusion to suggest acute infarction. No midline shift, mass effect, evidence of mass lesion, ventriculomegaly, extra-axial collection or acute intracranial hemorrhage. Cervicomedullary junction and pituitary are within normal limits. Negative visualized cervical spine.  Scattered cerebral white matter T2 and FLAIR hyperintensity is stable. Chronic anterior right temporal lobe encephalomalacia and gliosis is stable. There is a small chronic lacunar infarct of the right caudate nucleus which is new since 2013. Elsewhere deep gray matter nuclei are stable. Brainstem and cerebellum remain within normal limits for age.  Mild mastoid effusions have not significantly changed. Dysconjugate gaze again noted with otherwise negative orbits soft tissues. Paranasal sinuses are clear. Visible internal auditory structures appear normal. Visualized scalp soft tissues are within normal limits. Stable bone marrow signal, within normal limits.  MRA HEAD FINDINGS  Stable antegrade flow in the posterior circulation. No distal vertebral artery or basilar stenosis. Patent AICA and right PICA origins. SCA and PCA origins remain normal. Mild chronic left P2 segment stenosis is stable (series 506, image 12). Other PCA branches are within normal limits. Posterior communicating arteries are diminutive or absent.  Antegrade flow in both ICA siphons is stable. Ophthalmic artery origins are within normal limits. No siphon stenosis. Patent carotid termini. MCA and ACA origins are stable and within normal limits. Anterior communicating artery and visualized  bilateral ACA branches remain normal. Visualized right MCA branches are stable and within normal limits.  Chronic left MCA M1 irregularity with mild to moderate stenosis just proximal to the left anterior temporal artery origin is stable (series 505, image 9). Visualized left MCA branches are otherwise within normal limits.  IMPRESSION: 1. No acute intracranial abnormality. Mild progression of small vessel ischemia since 2013. 2. Stable intracranial MRA, with mild to moderate left PCA and left MCA stenoses with preserved distal flow.   Electronically Signed   By: Lars Pinks M.D.   On: 09/23/2014 20:33   Nm Pulmonary Perf And Vent  09/22/2014   CLINICAL DATA:  Hypoxia and shortness of breath. Multiple recent surgeries for trauma.  EXAM: NUCLEAR MEDICINE VENTILATION - PERFUSION LUNG SCAN  TECHNIQUE: Ventilation images were obtained in multiple projections using inhaled aerosol technetium 99 M DTPA. Perfusion images were obtained in multiple projections after intravenous injection of Tc-58m MAA.  RADIOPHARMACEUTICALS:  30 mCi Tc-55m DTPA aerosol and 99 mCi Tc-36m MAA  COMPARISON:  Chest radiograph same date  FINDINGS: Ventilation: Central clumping of the inhaled aerosolized radiotracer is noted as well as presumed swallowed activity within the distal esophagus. Allowing for this, no ventilation defect is identified. Mild air trapping at the bases is identified.  Perfusion: Decreased radiotracer intensity over the left  lung base is identified likely indicating matched retrocardiac opacity as seen on prior exam. No focal segmental perfusion defect is identified.  IMPRESSION: Low probability for pulmonary embolism. Matched perfusion defect at the left lung base correlate with pulmonary parenchymal opacity, likely atelectasis or possibly pneumonia, as seen on the prior dissimilar exam appointment today.   Electronically Signed   By: Conchita Paris M.D.   On: 09/22/2014 12:06   2D Echocardiogram  EF 55-60% with no  source of embolus.    PHYSICAL EXAM Pleasant frail elderly Caucasian lady not in distress.Awake alert. Afebrile. Head is nontraumatic. Neck is supple without bruit. Hearing is normal. Cardiac exam no murmur or gallop. Lungs are clear to auscultation. Distal pulses are well felt. Neurological Exam :  Awake  Alert oriented x 3. Normal speech and language.eye movements full without nystagmus.fundi were not visualized. Vision acuity and fields appear normal. Hearing is normal. Palatal movements are normal. Face symmetric. Tongue midline. Normal strength, tone, reflexes and coordination. Normal sensation. Gait deferred. ASSESSMENT/PLAN Kayla Kent is a 80 y.o. female with new onset of left hand decreased sensation and weakness during hospitalization for workup of SOB. She did not receive IV t-PA due to minimal symptoms, NIHSS 1.   Right brain TIA  In the setting of extreme anxiety  Resultant  Neuro deficits resolved  MRI  No acute stroke. Mild progression of small vessel ischemia   MRA  No significant stenosis. mild to moderate left PCA and left MCA stenoses   Carotid Doppler  pending   2D Echo  No source of embolus   HgbA1c 5.5  Lovenox 40 mg sq daily for VTE prophylaxis  Diet Heart thin liquids  aspirin 325 mg orally every day prior to admission, now on aspirin 325 mg orally every day. Recommend change to plavix.   Ongoing aggressive stroke risk factor management  Therapy recommendations:  HH PT (previously getting OP PT)  Disposition:  Return to daughter's home   [facility] for discharge from stroke standpoint once carotid doppler done  Follow up with Dr. Leonie Man in 1 months (i have written order).  Hypertension  Stable  Hyperlipidemia  Home meds:  crestor 5,  resumed in hospital  LDL 93, goal < 70  Increase statin if pt can tolerate  Continue statin at discharge  Other Stroke Risk Factors  Advanced age  Former Cigarette smoker, quit 60 years ago  Other  Active Problems  Acute on chronic diastolic heart failure  Suspected HCAP   Orthostatic hypotension  Anxiety - pt describes herself as an anxious person  Seizures, on Keppra  Recent recent right hip fracture and right distal radius fracture  R chronic exotropia, no double vision, compensated  R arm cast from recent surgery  Hospital day # [place] for Pager information 09/24/2014 11:29 AM  I have personally examined this patient, reviewed notes, independently viewed imaging studies, participated in medical decision making and plan of care. I have made any additions or clarifications directly to the above note. Agree with note above. Suspect she had a small right brain subcortical TIA due to small vessel disease. Continue stroke workup.  Antony Contras, MD Medical Director [facility] Pager: [phone] 09/24/2014 7:05 PM    To contact Stroke Continuity provider, please refer to [email or web address] After hours, contact General Neurology

## 2014-09-24 NOTE — Evaluation (Signed)
Occupational Therapy Evaluation Patient Details Name: Kayla Kent MRN: [id number] DOB: [birth year] Today's Date: 09/24/2014    History of Present Illness With recent right hip fracture and right distal radius fracture (dc'd from acute care 11/14), chronic diastolic heart failure who comes to the emergency room with shortness of breath starting this morning. She had gone to short-term skilled nursing facility after surgical repair of her right hip and was on Lovenox at that time. She was subsequently discharged to her daughter's house. Several days ago, she started having a cough productive of yellow sputum. She started feeling weak and had difficulty bearing weight on her left leg. She woke up suddenly today with dyspnea. Worse with lying supine.  When EMS arrived, her oxygen saturations were reportedly in the 70s on room air..  Pt had episode after PT on Mon 09/23/14 with tingling in left UE.  Nursing and MD notified and MD ordered code stroke.     Clinical Impression   Pt s/p above. Feel pt will benefit from acute OT to increase independence and strength prior to d/c. Pt spoke with OT about going wanting to go back to SNF for d/c (but wondering if she meant Outpatient after talking to Physical Therapist). Feel pt will be fine to return home with daughter to assist. Will continue to see acutely.    Follow Up Recommendations  Home health OT;Supervision/Assistance - 24 hour    Equipment Recommendations  None recommended by OT    Recommendations for Other Services       Precautions / Restrictions Precautions Precautions: Fall Restrictions Weight Bearing Restrictions: Yes RUE Weight Bearing: Weight bear through elbow only RLE Weight Bearing: Weight bearing as tolerated Other Position/Activity Restrictions: no pushing, pulling, lifting with Rt hand (can hold light objects)      Mobility Bed Mobility Overal bed mobility: Needs Assistance Bed Mobility: Supine to Sit;Sit to Supine      Supine to sit: Min assist Sit to supine: Supervision   General bed mobility comments: assist with trunk to go from supine to sitting position.   Transfers Overall transfer level: Needs assistance Equipment used: Right platform walker Transfers: Sit to/from Stand Sit to Stand: Min guard              Balance                                            ADL       Grooming: Oral care;Wash/dry face;applying deodorant;Standing;Min guard   Upper Body Bathing: Min guard;Standing               Toilet Transfer: Minimal assistance;Ambulation;RW (bed)           Functional mobility during ADLs: Minimal assistance;Rolling walker (ambulated some in hallway) General ADL Comments: Cues for WB precautions on RUE during session. Educated on safety such as sitting for LB ADLs, safe shoewear, rugs. mentioned there is AE available to assist with LB ADLs as OT had to assist with socks. Spoke about d/c plan.     Vision  Rt eye exotropic                   Perception     Praxis      Pertinent Vitals/Pain Pain Assessment: No/denies pain   BP 123/39 supine and 130/54 sitting EOB     Hand Dominance Right   Extremity/Trunk  Assessment Upper Extremity Assessment Upper Extremity Assessment: RUE deficits/detail RUE Deficits / Details: Rt distal radial fracture RUE: Unable to fully assess due to immobilization   Lower Extremity Assessment Lower Extremity Assessment: Defer to PT evaluation       Communication Communication Communication: No difficulties   Cognition Arousal/Alertness: Awake/alert Behavior During Therapy: WFL for tasks assessed/performed;Impulsive Overall Cognitive Status: Within Functional Limits for tasks assessed                     General Comments       Exercises       Shoulder Instructions      Home Living Family/patient expects to be discharged to:: Private residence Living Arrangements: Children Available  Help at Discharge: Family;Available 24 hours/day Type of Home: House Home Access: Stairs to enter [employer] of Steps: 5 Entrance Stairs-Rails: Left Home Layout: One level     Bathroom Shower/Tub: Tub/shower unit (at [employer])   [employer]:  (daughter has both)     Home Equipment: Environmental consultant - 2 wheels;Tub bench;Other (comment) (platform walker)          Prior Functioning/Environment Level of Independence: Independent        Comments: very independent leading up to recent fall; was going to OUtpt PT prior to this admission    OT Diagnosis: Generalized weakness   OT Problem List: Decreased strength;Decreased range of motion;Decreased activity tolerance;Decreased knowledge of use of DME or AE;Decreased knowledge of precautions;Impaired UE functional use   OT Treatment/Interventions: Self-care/ADL training;DME and/or AE instruction;Therapeutic activities;Balance training;Patient/family education;Therapeutic exercise    OT Goals(Current goals can be found in the care plan section) Acute Rehab OT Goals Patient Stated Goal: do the therapy she was doing before OT Goal Formulation: With patient Time For Goal Achievement: 10/01/14 Potential to Achieve Goals: Good ADL Goals Pt Will Perform Lower Body Dressing: with set-up;with supervision;with caregiver independent in assisting;sit to/from stand Pt Will Transfer to Toilet: with supervision;ambulating (elevated toilet) Additional ADL Goal #1: Pt will be supervision level for bed mobility as precursor for ADLs.   OT Frequency: Min 2X/week   Barriers to D/C:            Co-evaluation              End of Session Equipment Utilized During Treatment: Gait belt;Other (comment) (platform walker)  Activity Tolerance: Patient tolerated treatment well Patient left: in bed;with call bell/phone within reach   Time: 0852-0921 OT Time Calculation (min): 29 min Charges:  OT General Charges $OT Visit: 1  Procedure OT Evaluation $Initial OT Evaluation Tier I: 1 Procedure OT Treatments $Self Care/Home Management : 8-22 mins G-CodesBenito Mccreedy OTR/L [phone] 09/24/2014, 6:06 PM

## 2014-09-24 NOTE — Telephone Encounter (Signed)
Daughter is requesting Dr. Jenny Reichmann to review patients records before appointment on Friday the 11th.

## 2014-09-24 NOTE — Telephone Encounter (Signed)
Very sorry, but I normally do not do this unless there is a particular question, as this is included in what we do during the visit itself

## 2014-09-24 NOTE — Progress Notes (Signed)
Physical Therapy Treatment Patient Details Name: Kayla Kent MRN: [id number] DOB: [birth year] Today's Date: 09/24/2014    History of Present Illness With recent right hip fracture and right distal radius fracture (dc'd from acute care 11/14), chronic diastolic heart failure who comes to the emergency room with shortness of breath starting this morning. She had gone to short-term skilled nursing facility after surgical repair of her right hip and was on Lovenox at that time. She was subsequently discharged to her daughter's house. Several days ago, she started having a cough productive of yellow sputum. She started feeling weak and had difficulty bearing weight on her left leg. She woke up suddenly today with dyspnea. Worse with lying supine.  When EMS arrived, her oxygen saturations were reportedly in the 70s on room air..  Pt had episode after PT on Mon 09/23/14 with tingling in left UE.  Nursing and MD notified and MD ordered code stroke.      PT Comments    Pt currently with functional limitations due to decreased endurance, decreased strength, and orthostatic BPs. Pt will benefit from skilled PT to increase independence and safety with mobility to allow discharge to home with Outpatient PT. Pt receiving Outpatient PT prior to hospital admission and liked the site and the progress she was making at this venue. Pt stated she would like to return to her Outpatient PT clinic at discharge instead of receiving home health. PT in agreement that pt will benefit from Outpatient services once BP is stable at discharge. Pt demonstrated improved mobility and improved tolerance during today's session which was limited by orthostatic BP readings. See prior note on orthostatics for more specifics. Pt reported no dizziness or pain during PT session and was eager to perform exercises while sitting up in chair at end of session. Pt's orthostatic BP readings were relayed to her RN. RN stated she will re-check  orthostatics around noon.     Follow Up Recommendations  Outpatient PT;Supervision - Intermittent     Equipment Recommendations  None recommended by PT    Recommendations for Other Services       Precautions / Restrictions Precautions Precautions: Fall Restrictions Weight Bearing Restrictions: Yes RUE Weight Bearing: Weight bearing as tolerated RLE Weight Bearing: Weight bearing as tolerated    Mobility  Bed Mobility Overal bed mobility: Needs Assistance Bed Mobility: Supine to Sit     Supine to sit: Min assist     General bed mobility comments: Pt requires min assistance with trunk support when transferring from supine to sit. Pt able to maintain WBing precautions during transition.   Transfers Overall transfer level: Needs assistance Equipment used: Right platform walker Transfers: Sit to/from Stand Sit to Stand: Min guard         General transfer comment: Pt required min guard when transferring from sitting EOB to stand for safety. Pt had no loss of balance or dizziness during transfer. Pts BP was taken in standing and after 3 minutes standing.   Ambulation/Gait Ambulation/Gait assistance: Min guard Ambulation Distance (Feet): 10 Feet Assistive device: Right platform walker Gait Pattern/deviations: Step-through pattern;Decreased stride length;Decreased stance time - right   Gait velocity interpretation: Below normal speed for age/gender General Gait Details: Pt ambulated around bed to chair using RW with Right platform. Pt had no loss of balance. Pt with decreased stance time on Right LE and hiking of the Right hip with stance on Left. Pt reported no dizziness during ambulation.    Stairs  Wheelchair Mobility    Modified Rankin (Stroke Patients Only)       Balance           Standing balance support: During functional activity;Bilateral upper extremity supported Standing balance-Leahy Scale: Fair Standing balance comment: Pt able  to stand without support but requires RW for ambulation.                     Cognition Arousal/Alertness: Awake/alert Behavior During Therapy: WFL for tasks assessed/performed;Impulsive Overall Cognitive Status: Within Functional Limits for tasks assessed                      Exercises Total Joint Exercises Ankle Circles/Pumps: AROM;Both;10 reps;Seated Gluteal Sets: Strengthening;Both;10 reps;Seated Towel Squeeze: Strengthening;Both;10 reps;Seated Long Arc Quad: Strengthening;Both;10 reps;Seated General Exercises - Lower Extremity Hip Flexion/Marching: AROM;Both;10 reps;Seated Toe Raises: Strengthening;Both;10 reps;Seated    General Comments        Pertinent Vitals/Pain Pain Assessment: No/denies pain  Orthostatic BPs Supine 141/45  Sitting 117/96  Standing 105/41  Standing after 3 min 97/39  Pt's BP stable at 124/49 at end of session while resting in chair.      Home Living                      Prior Function            PT Goals (current goals can now be found in the care plan section) Acute Rehab PT Goals PT Goal Formulation: With patient Time For Goal Achievement: 10/06/14 Potential to Achieve Goals: Good Progress towards PT goals: Not progressing toward goals - comment (orthostatics limited treatment)    Frequency  Min 5X/week    PT Plan Current plan remains appropriate    Co-evaluation             End of Session Equipment Utilized During Treatment: Gait belt Activity Tolerance: Patient limited by fatigue;Treatment limited secondary to medical complications (Comment) (low BPs) Patient left: in chair;with call bell/phone within reach     Time: 1025-1100 PT Time Calculation (min) (ACUTE ONLY): 35 min  Charges:  $Gait Training: 8-22 mins $Therapeutic Activity: 8-22 mins                    G CodesJearld Shines SPT 09/24/2014, 11:32 AM  Jearld Shines, SPT  Acute  Rehabilitation [phone]

## 2014-09-24 NOTE — Progress Notes (Signed)
*  PRELIMINARY RESULTS* Vascular Ultrasound Carotid Duplex (Doppler) has been completed.  Preliminary findings: Bilateral:  1-39% ICA stenosis.  Vertebral artery flow is antegrade.     Landry Mellow, RDMS, RVT  09/24/2014, 11:59 AM

## 2014-09-24 NOTE — Progress Notes (Addendum)
PATIENT DETAILS Name: VELECIA OVITT Age: 80 y.o. Sex: female Date of Birth: [birth year] Admit Date: 09/21/2014 Admitting Physician Delfina Redwood, MD CVE:[id number] Jenny Reichmann, MD  Subjective: Much better, no longer "dizzy" upon standing. No further tingling/numbness in left hand.   Assessment/Plan: Principal Problem:   Acute respiratory failure:Resolved,suspect secondary to acute diastolic CHF.? PNA, repeat CXR on 12/7 showed infiltrate vs Atelectasis on left lower lung.  Blood cultures negative, since some suspicion for PNA, have narrowed Abx from Vanco/Cefepime to Levaquin. VQ scan and Lower ext Doppler neg for DVT.  Active Problems:   Acute on chronic diastolic heart failure:compensated,with IV Lasix, Unfortunately became orthostatic on 12/7-hence Lasix discontinued. Remains clinically euvolemic, will plan to use lasix prn. Echo confirms grade 3 diastolic dysfunction.    Suspected HCAP: started on IV Vanco/Cefepime-repeat CXR as above, blood cultures negative, will transition to oral Levaquin for 5 more days.  . No fever and non toxic appearing.    Orthostatic hypotension: much better on 12/8-not symptomatic on standing today-will plan on resuming Atenolol, and using as needed lasix. Place TED hose.   ?TIA: Developed left hand numbness and very minimal weakness on 12/7. CT head, MRI brain negative for CVA. 2-D echocardiogram negative for any embolic source. Carotid Doppler pending. A1c 5.5, LDL 93 (goal less then 70)-already on crestor 5mg -will double dose to 10 mg.On ASA 325 mg prior to admission, will need to switch to Plavix.     HLD (hyperlipidemia):c/w crestor-LDL 93-not at goal for CVA/TIA-therefore increase Crestor to 10 mg daily    Anxiety:stable-continue Xanax    Essential hypertension:moderate control with just Cardizem-add back atenolol.    Seizures:c/w Keppra    Recent recent right hip fracture and right distal radius fracture:PT eval  Disposition: Remain  inpatient-home when stroke workup complete.  Antibiotics:  See below   Anti-infectives    Start     Dose/Rate Route Frequency Ordered Stop   09/23/14 1000  levofloxacin (LEVAQUIN) tablet 750 mg     750 mg Oral Every 48 hours 09/23/14 0949 09/29/14 0959   09/22/14 0900  vancomycin (VANCOCIN) 500 mg in sodium chloride 0.9 % 100 mL IVPB  Status:  Discontinued     500 mg100 mL/hr over 60 Minutes Intravenous Every 12 hours 09/21/14 2023 09/23/14 0948   09/21/14 2200  ceFEPIme (MAXIPIME) 1 g in dextrose 5 % 50 mL IVPB  Status:  Discontinued     1 g100 mL/hr over 30 Minutes Intravenous Every 12 hours 09/21/14 2012 09/23/14 0948   09/21/14 2100  vancomycin (VANCOCIN) IVPB 750 mg/150 ml premix     750 mg150 mL/hr over 60 Minutes Intravenous  Once 09/21/14 2023 09/21/14 2335   09/21/14 1900  levofloxacin (LEVAQUIN) IVPB 750 mg  Status:  Discontinued     750 mg100 mL/hr over 90 Minutes Intravenous Every 48 hours 09/21/14 1748 09/21/14 2012   09/21/14 1730  vancomycin (VANCOCIN) 1,250 mg in sodium chloride 0.9 % 250 mL IVPB  Status:  Discontinued     1,250 mg166.7 mL/hr over 90 Minutes Intravenous  Once 09/21/14 1722 09/21/14 2021      DVT Prophylaxis: Prophylactic Lovenox   Code Status: Full code  Family Communication Daughter at bedside  Procedures:  None  CONSULTS:  None  MEDICATIONS: Scheduled Meds: . antiseptic oral rinse  7 mL Mouth Rinse BID  . aspirin  325 mg Oral Daily  . atenolol  12.5 mg Oral Daily  . diltiazem  180 mg  Oral Daily  . docusate sodium  100 mg Oral BID  . enoxaparin  40 mg Subcutaneous QHS  . furosemide  40 mg Intravenous Once  . levETIRAcetam  250 mg Oral BID  . levofloxacin  750 mg Oral Q48H  . potassium chloride  20 mEq Oral Daily  . rosuvastatin  5 mg Oral QHS  . sodium chloride  3 mL Intravenous Q12H   Continuous Infusions:  PRN Meds:.acetaminophen, albuterol, ALPRAZolam, bisacodyl, guaiFENesin-dextromethorphan, HYDROcodone-acetaminophen,  ondansetron **OR** ondansetron (ZOFRAN) IV, polyethylene glycol, zolpidem    PHYSICAL EXAM: Vital signs in last 24 hours: Filed Vitals:   09/24/14 0055 09/24/14 0400 09/24/14 0455 09/24/14 0831  BP: 145/50  124/53 171/39  Pulse: 42  74 79  Temp:  99 F (37.2 C)    TempSrc:      Resp: 20  19 17   Height:      Weight:      SpO2: 88%  92% 92%    Weight change:  Filed Weights   09/21/14 1900 09/22/14 0538 09/23/14 0508  Weight: 52.527 kg (115 lb 12.8 oz) 53.524 kg (118 lb) 53.7 kg (118 lb 6.2 oz)   Body mass index is 19.12 kg/(m^2).   Gen Exam: Awake and alert with clear speech.   Neck: Supple, No JVD.   Chest: B/L Clear.   CVS: S1 S2 Regular, no murmurs.  Abdomen: soft, BS +, non tender, non distended.  Extremities: no edema, lower extremities warm to touch. Neurologic: Non Focal.   Skin: No Rash.   Wounds: N/A.    Intake/Output from previous day:  Intake/Output Summary (Last 24 hours) at 09/24/14 1105 Last data filed at 09/24/14 0830  Gross per 24 hour  Intake      0 ml  Output    100 ml  Net   -100 ml     LAB RESULTS: CBC  Recent Labs Lab 09/23/14 0444  WBC 5.6  HGB 12.1  HCT 37.4  PLT 158  MCV 91.4  MCH 29.6  MCHC 32.4  RDW 14.4    Chemistries   Recent Labs Lab 09/21/14 1408 09/23/14 0444  NA 144 142  K 4.2 3.5*  CL 106 103  CO2 23 24  GLUCOSE 112* 74  BUN 13 12  CREATININE 0.80 0.90  CALCIUM 9.2 8.6    CBG:  Recent Labs Lab 09/23/14 1308  GLUCAP 82    GFR Estimated Creatinine Clearance: 42.3 mL/min (by C-G formula based on Cr of 0.9).  Coagulation profile No results for input(s): INR, PROTIME in the last 168 hours.  Cardiac Enzymes No results for input(s): CKMB, TROPONINI, MYOGLOBIN in the last 168 hours.  Invalid input(s): CK  Invalid input(s): POCBNP No results for input(s): DDIMER in the last 72 hours.  Recent Labs  09/23/14 0444  HGBA1C 5.5    Recent Labs  09/24/14 0009  CHOL 141  HDL 30*  LDLCALC 93    TRIG 89  CHOLHDL 4.7   No results for input(s): TSH, T4TOTAL, T3FREE, THYROIDAB in the last 72 hours.  Invalid input(s): FREET3 No results for input(s): VITAMINB12, FOLATE, FERRITIN, TIBC, IRON, RETICCTPCT in the last 72 hours. No results for input(s): LIPASE, AMYLASE in the last 72 hours.  Urine Studies No results for input(s): UHGB, CRYS in the last 72 hours.  Invalid input(s): UACOL, UAPR, USPG, UPH, UTP, UGL, UKET, UBIL, UNIT, UROB, ULEU, UEPI, UWBC, URBC, UBAC, CAST, UCOM, BILUA  MICROBIOLOGY: Recent Results (from the past 240 hour(s))  Blood culture (routine  x 2)     Status: None (Preliminary result)   Collection Time: 09/21/14  5:41 PM  Result Value Ref Range Status   Specimen Description BLOOD LEFT FOREARM  Final   Special Requests BOTTLES DRAWN AEROBIC AND ANAEROBIC 5CC  Final   Culture  Setup Time   Final    09/22/2014 03:55 Performed at [facility]    Culture   Final           BLOOD CULTURE RECEIVED NO GROWTH TO DATE CULTURE WILL BE HELD FOR 5 DAYS BEFORE ISSUING A FINAL NEGATIVE REPORT Performed at [facility]    Report Status PENDING  Incomplete  Blood culture (routine x 2)     Status: None (Preliminary result)   Collection Time: 09/21/14  5:50 PM  Result Value Ref Range Status   Specimen Description BLOOD LEFT HAND  Final   Special Requests BOTTLES DRAWN AEROBIC ONLY 3CC  Final   Culture  Setup Time   Final    09/22/2014 03:55 Performed at [facility]    Culture   Final           BLOOD CULTURE RECEIVED NO GROWTH TO DATE CULTURE WILL BE HELD FOR 5 DAYS BEFORE ISSUING A FINAL NEGATIVE REPORT Performed at [facility]    Report Status PENDING  Incomplete    RADIOLOGY STUDIES/RESULTS: Dg Chest 2 View  09/23/2014   CLINICAL DATA:  Shortness of breath.  EXAM: CHEST  2 VIEW  COMPARISON:  09/21/2014.  08/28/2014.  07/16/2009.  FINDINGS: Mediastinum and hilar structures are normal. Cardiomegaly with normal pulmonary  vascularity. Bibasilar atelectasis with left pleural effusion. Apical pleural thickening consistent with scarring.No pneumothorax. Cardiomegaly with normal pulmonary vascularity. No acute bony abnormality. Thoracic spine osteopenia degenerative change.  IMPRESSION: 1. Basilar atelectasis with small left pleural effusion. 2. Cardiomegaly with normal pulmonary vascularity.   Electronically Signed   By: Marcello Moores  Register   On: 09/23/2014 08:15   Dg Wrist Complete Right  08/28/2014   CLINICAL DATA:  Fall while walking this afternoon. Initial evaluation.  EXAM: RIGHT WRIST - COMPLETE 3+ VIEW  COMPARISON:  None.  FINDINGS: Comminuted posterior angulated displaced distal right radial fracture with extension to the radiocarpal joint space noted. Displaced ulnar styloid fracture.  IMPRESSION: Comminuted displaced posteriorly angulated fracture of the distal radius with extension to the radiocarpal joint space. Displaced ulnar styloid fracture.   Electronically Signed   By: Marcello Moores  Register   On: 08/28/2014 18:48   Dg Hip Complete Right  08/28/2014   CLINICAL DATA:  Fall wall wall and this afternoon. Right lateral hip pain. Initial encounter.  EXAM: RIGHT HIP - COMPLETE 2+ VIEW  COMPARISON:  None.  FINDINGS: There is a right femoral neck fracture with varus angulation. No subluxation or dislocation. Left hip and SI joints are unremarkable.  IMPRESSION: Right femoral neck fracture with varus angulation.   Electronically Signed   By: Rolm Baptise M.D.   On: 08/28/2014 18:48   Dg Hip Operative Right  08/29/2014   CLINICAL DATA:  Right femoral neck fracture. Arthroplasty. Initial encounter.  EXAM: DG OPERATIVE RIGHT HIP 1-2 VIEWS  TECHNIQUE: Fluoroscopic spot image(s) were submitted for interpretation post-operatively.  COMPARISON:  Radiographs 08/28/2014.  FINDINGS: Two spot fluoroscopic images of the right hip demonstrate interval resection of the right femoral head and bipolar hemiarthroplasty. The hardware appears  well positioned. There is no evidence of acute fracture or dislocation. There is gas within the operative bed.  IMPRESSION:  Intraoperative views following right hip hemiarthroplasty. No demonstrated complication.   Electronically Signed   By: Camie Patience M.D.   On: 08/29/2014 20:12   Ct Head Wo Contrast  09/23/2014   CLINICAL DATA:  Could stroke onset at 12:40 p.m. Left hand numbness and weakness.  EXAM: CT HEAD WITHOUT CONTRAST  TECHNIQUE: Contiguous axial images were obtained from the base of the skull through the vertex without intravenous contrast.  COMPARISON:  Brain MRI 02/02/2012 CT 05/07/1999 and  FINDINGS: No acute intracranial hemorrhage. No focal mass lesion. No CT evidence of acute infarction. No midline shift or mass effect. No hydrocephalus. Basilar cisterns are patent.  There is a remote infarction in the inferior frontotemporal lobe. There is mild periventricular white matter hypodensities.  IMPRESSION: 1. No acute cortical infarction by CT imaging. 2. No intracranial hemorrhage. 3. Remote right temporal lobe infarction. Findings conveyed toDr.  Nicole Kindred On 09/23/2014  at14:11.   Electronically Signed   By: Suzy Bouchard M.D.   On: 09/23/2014 14:10   Mr [name] Wo Contrast  09/23/2014   CLINICAL DATA:  80 year old female with left hand decreased sensation and weakness. Code stroke. Initial encounter. Current history of acute fractures in November treated with surgical hip replacement at that time.  EXAM: MRI HEAD WITHOUT CONTRAST  MRA HEAD WITHOUT CONTRAST  TECHNIQUE: Multiplanar, multiecho pulse sequences of the brain and surrounding structures were obtained without intravenous contrast. Angiographic images of the head were obtained using MRA technique without contrast.  COMPARISON:  Head CT without contrast 1328 hr today. Brain MRI and head and neck MRA 02/02/2012.  FINDINGS: MRI HEAD FINDINGS  Stable cerebral volume. Major intracranial vascular flow voids are stable. No restricted  diffusion to suggest acute infarction. No midline shift, mass effect, evidence of mass lesion, ventriculomegaly, extra-axial collection or acute intracranial hemorrhage. Cervicomedullary junction and pituitary are within normal limits. Negative visualized cervical spine.  Scattered cerebral white matter T2 and FLAIR hyperintensity is stable. Chronic anterior right temporal lobe encephalomalacia and gliosis is stable. There is a small chronic lacunar infarct of the right caudate nucleus which is new since 2013. Elsewhere deep gray matter nuclei are stable. Brainstem and cerebellum remain within normal limits for age.  Mild mastoid effusions have not significantly changed. Dysconjugate gaze again noted with otherwise negative orbits soft tissues. Paranasal sinuses are clear. Visible internal auditory structures appear normal. Visualized scalp soft tissues are within normal limits. Stable bone marrow signal, within normal limits.  MRA HEAD FINDINGS  Stable antegrade flow in the posterior circulation. No distal vertebral artery or basilar stenosis. Patent AICA and right PICA origins. SCA and PCA origins remain normal. Mild chronic left P2 segment stenosis is stable (series 506, image 12). Other PCA branches are within normal limits. Posterior communicating arteries are diminutive or absent.  Antegrade flow in both ICA siphons is stable. Ophthalmic artery origins are within normal limits. No siphon stenosis. Patent carotid termini. MCA and ACA origins are stable and within normal limits. Anterior communicating artery and visualized bilateral ACA branches remain normal. Visualized right MCA branches are stable and within normal limits.  Chronic left MCA M1 irregularity with mild to moderate stenosis just proximal to the left anterior temporal artery origin is stable (series 505, image 9). Visualized left MCA branches are otherwise within normal limits.  IMPRESSION: 1. No acute intracranial abnormality. Mild progression of  small vessel ischemia since 2013. 2. Stable intracranial MRA, with mild to moderate left PCA and left MCA stenoses with preserved distal  flow.   Electronically Signed   By: Lars Pinks M.D.   On: 09/23/2014 20:33   Mr Brain Wo Contrast  09/23/2014   CLINICAL DATA:  80 year old female with left hand decreased sensation and weakness. Code stroke. Initial encounter. Current history of acute fractures in November treated with surgical hip replacement at that time.  EXAM: MRI HEAD WITHOUT CONTRAST  MRA HEAD WITHOUT CONTRAST  TECHNIQUE: Multiplanar, multiecho pulse sequences of the brain and surrounding structures were obtained without intravenous contrast. Angiographic images of the head were obtained using MRA technique without contrast.  COMPARISON:  Head CT without contrast 1328 hr today. Brain MRI and head and neck MRA 02/02/2012.  FINDINGS: MRI HEAD FINDINGS  Stable cerebral volume. Major intracranial vascular flow voids are stable. No restricted diffusion to suggest acute infarction. No midline shift, mass effect, evidence of mass lesion, ventriculomegaly, extra-axial collection or acute intracranial hemorrhage. Cervicomedullary junction and pituitary are within normal limits. Negative visualized cervical spine.  Scattered cerebral white matter T2 and FLAIR hyperintensity is stable. Chronic anterior right temporal lobe encephalomalacia and gliosis is stable. There is a small chronic lacunar infarct of the right caudate nucleus which is new since 2013. Elsewhere deep gray matter nuclei are stable. Brainstem and cerebellum remain within normal limits for age.  Mild mastoid effusions have not significantly changed. Dysconjugate gaze again noted with otherwise negative orbits soft tissues. Paranasal sinuses are clear. Visible internal auditory structures appear normal. Visualized scalp soft tissues are within normal limits. Stable bone marrow signal, within normal limits.  MRA HEAD FINDINGS  Stable antegrade flow in  the posterior circulation. No distal vertebral artery or basilar stenosis. Patent AICA and right PICA origins. SCA and PCA origins remain normal. Mild chronic left P2 segment stenosis is stable (series 506, image 12). Other PCA branches are within normal limits. Posterior communicating arteries are diminutive or absent.  Antegrade flow in both ICA siphons is stable. Ophthalmic artery origins are within normal limits. No siphon stenosis. Patent carotid termini. MCA and ACA origins are stable and within normal limits. Anterior communicating artery and visualized bilateral ACA branches remain normal. Visualized right MCA branches are stable and within normal limits.  Chronic left MCA M1 irregularity with mild to moderate stenosis just proximal to the left anterior temporal artery origin is stable (series 505, image 9). Visualized left MCA branches are otherwise within normal limits.  IMPRESSION: 1. No acute intracranial abnormality. Mild progression of small vessel ischemia since 2013. 2. Stable intracranial MRA, with mild to moderate left PCA and left MCA stenoses with preserved distal flow.   Electronically Signed   By: Lars Pinks M.D.   On: 09/23/2014 20:33   Pelvis Portable  08/29/2014   CLINICAL DATA:  Status post right hip replacement after intertrochanteric fracture.  EXAM: PORTABLE PELVIS 1-2 VIEWS  COMPARISON:  Plain films of right hip 08/28/2014.  FINDINGS: Bipolar right hip hemiarthroplasty is in place. The device is located. No fracture is seen. Surgical staples and gas in the soft tissues are noted.  IMPRESSION: Right hip replacement without evidence of complication.   Electronically Signed   By: Inge Rise M.D.   On: 08/29/2014 23:40   Nm Pulmonary Perf And Vent  09/22/2014   CLINICAL DATA:  Hypoxia and shortness of breath. Multiple recent surgeries for trauma.  EXAM: NUCLEAR MEDICINE VENTILATION - PERFUSION LUNG SCAN  TECHNIQUE: Ventilation images were obtained in multiple projections using  inhaled aerosol technetium 99 M DTPA. Perfusion images were obtained in multiple  projections after intravenous injection of Tc-17m MAA.  RADIOPHARMACEUTICALS:  30 mCi Tc-74m DTPA aerosol and 99 mCi Tc-20m MAA  COMPARISON:  Chest radiograph same date  FINDINGS: Ventilation: Central clumping of the inhaled aerosolized radiotracer is noted as well as presumed swallowed activity within the distal esophagus. Allowing for this, no ventilation defect is identified. Mild air trapping at the bases is identified.  Perfusion: Decreased radiotracer intensity over the left lung base is identified likely indicating matched retrocardiac opacity as seen on prior exam. No focal segmental perfusion defect is identified.  IMPRESSION: Low probability for pulmonary embolism. Matched perfusion defect at the left lung base correlate with pulmonary parenchymal opacity, likely atelectasis or possibly pneumonia, as seen on the prior dissimilar exam appointment today.   Electronically Signed   By: Conchita Paris M.D.   On: 09/22/2014 12:06   Dg Chest Port 1 View  09/21/2014   CLINICAL DATA:  Initial evaluation for weakness and shortness of breath for 2 days, right hip surgery and right hand surgery on November 12th, personal history of controlled hypertension  EXAM: PORTABLE CHEST - 1 VIEW  COMPARISON:  08/28/2014  FINDINGS: Mild to moderate cardiac enlargement. Moderate vascular congestion. Stable aortic arch calcification. Bilateral perihilar vessels are indistinct. Mild blunting left costophrenic angle suggests tiny effusion.  IMPRESSION: Suspect mild pulmonary edema. Limited evaluation of retrocardiac area with opacity and blunting of the costophrenic angle. Possibility of left lower lobe pneumonia not excluded on single frontal view.   Electronically Signed   By: Skipper Cliche M.D.   On: 09/21/2014 16:00   Dg Chest Port 1 View  08/28/2014   CLINICAL DATA:  Preop evaluation following hip fracture, hypertension  EXAM: PORTABLE  CHEST - 1 VIEW  COMPARISON:  07/16/2009  FINDINGS: Cardiac shadow is stable. The lungs are again hyperinflated. Mild increase in central vascular congestion is noted. Mild scarring is noted in the left lung base. No focal confluent infiltrate is seen.  IMPRESSION: Mild vascular congestion.  No other acute abnormality is noted   Electronically Signed   By: Inez Catalina M.D.   On: 08/28/2014 19:21   Dg Femur Right Port  08/28/2014   CLINICAL DATA:  Fall with right hip pain.  EXAM: PORTABLE RIGHT FEMUR - 2 VIEW  COMPARISON:  Right hip same day.  FINDINGS: Images of the right femur excluding the right hip demonstrate no additional fracture sites. Remainder the exam is unremarkable.  IMPRESSION: No acute findings.   Electronically Signed   By: Marin Olp M.D.   On: 08/28/2014 20:37    Oren Binet, MD  [facility] Pager:[phone]  If 7PM-7AM, please contact night-coverage [email or web address] Password [email or web address] 09/24/2014, 11:05 AM   LOS: 3 days

## 2014-09-24 NOTE — Progress Notes (Signed)
PT note Orthostatic BPs  Supine 141/45  Sitting 117/96  Standing 105/41  Standing after 3 min 97/39  Pt again with orthostatic BPs with therapy today, however was not symptomatic.  States she feels tired but not dizzy.  Allowed pt to sit in chair as BP stabilized once sitting to 115/42.  Upon departure of room, BP 124/49.  Notified nursing.  See PT treatment  note for full session details.     Thanks.   [name] [phone] (pager)

## 2014-09-25 ENCOUNTER — Encounter (HOSPITAL_COMMUNITY): Payer: Self-pay | Admitting: Physician Assistant

## 2014-09-25 DIAGNOSIS — I483 Typical atrial flutter: Secondary | ICD-10-CM

## 2014-09-25 LAB — BASIC METABOLIC PANEL
ANION GAP: 15 (ref 5–15)
BUN: 17 mg/dL (ref 6–23)
CHLORIDE: 98 meq/L (ref 96–112)
CO2: 25 mEq/L (ref 19–32)
CREATININE: 0.98 mg/dL (ref 0.50–1.10)
Calcium: 9.1 mg/dL (ref 8.4–10.5)
GFR calc Af Amer: 61 mL/min — ABNORMAL LOW (ref >90)
GFR, EST NON AFRICAN AMERICAN: 53 mL/min — AB (ref >90)
Glucose, Bld: 107 mg/dL — ABNORMAL HIGH (ref 70–99)
POTASSIUM: 4.4 meq/L (ref 3.7–5.3)
Sodium: 138 mEq/L (ref 137–147)

## 2014-09-25 MED ORDER — APIXABAN 2.5 MG PO TABS
2.5000 mg | ORAL_TABLET | Freq: Two times a day (BID) | ORAL | Status: DC
  Administered 2014-09-25 – 2014-09-26 (×2): 2.5 mg via ORAL
  Filled 2014-09-25 (×2): qty 1

## 2014-09-25 MED ORDER — ATENOLOL 25 MG PO TABS
25.0000 mg | ORAL_TABLET | Freq: Every day | ORAL | Status: DC
  Administered 2014-09-26: 25 mg via ORAL
  Filled 2014-09-25: qty 1

## 2014-09-25 MED ORDER — CLOPIDOGREL BISULFATE 75 MG PO TABS
75.0000 mg | ORAL_TABLET | Freq: Every day | ORAL | Status: DC
  Administered 2014-09-25: 75 mg via ORAL
  Filled 2014-09-25: qty 1

## 2014-09-25 MED ORDER — SODIUM CHLORIDE 0.9 % IV BOLUS (SEPSIS): 500.0000 mL | Freq: Once | INTRAVENOUS | Status: DC

## 2014-09-25 NOTE — Consult Note (Signed)
CARDIOLOGY CONSULT NOTE   Patient ID: MICHON KACZMAREK MRN: [id number] DOB/AGE: 07-04-1932 80 y.o.  Admit date: 09/21/2014  Primary Physician   Cathlean Cower, MD Primary Cardiologist   Dr. Harrington Challenger Reason for Consultation   atrial flutter  QBH:ALPF LONDEN LORGE is a 80 y.o. 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  She was brought to the emergency room on 12/05 with shortness of breath. She was admitted for left lower lobe pneumonia.   On 12/07, she developed left hand paresthesias he is and weakness. A code stroke was called. She did not require TPA and improved rapidly. Dr. Leonie Man suspected she had a small right brain subcortical TIA due to small vessel disease, stroke workup ongoing. Carotid Dopplers were without critical disease. EF preserved on echocardiogram with grade 3 diastolic dysfunction and a severely dilated left atrium.    She had some volume overload on chest x-ray and her BNP was elevated. She was diuresed. However, her blood pressure decreased and she developed orthostatic hypotension. This felt secondary to diuresis. Lasix is on hold.  Today, she was seen by Dr. Olevia Bowens and was noted to have atrial flutter with variable block. She had some bradycardia and hypotension felt secondary to atenolol. Cardiology was asked to evaluate her.  Ms. Stokes never gets palpitations. She has no awareness of a rapid or irregular heart rate at any time. She denies any history of presyncope or syncope since her ablation in 2010. When she fell in November, she was out doing yard work and lost her balance. She is generally very active around the house and yard and is looking forward to increasing her activity.  Since being in the hospital, she feels a little lightheaded when her blood pressure drops, these changes are orthostatic in nature. Currently her systolic blood pressure is in the 90s  while she is sitting up and she is asymptomatic. She does not generally check her heart rate or blood pressure at home. After her hip surgery, she was taken off Pletal and put on Lovenox for week. She had no problems with Lovenox and was on aspirin after that. Upon admission, these medications were discontinued but she had no bleeding issues while she was on blood thinners.   Past Medical History  Diagnosis Date  . Anxiety   . Paroxysmal supraventricular tachycardia   . Uterine prolapse without mention of vaginal wall prolapse   . HTN (hypertension)   . HLD (hyperlipidemia)   . Intolerance of drug     orthostatic  . PVD (peripheral vascular disease)   . Allergic rhinitis   . Syncope and collapse      Past Surgical History  Procedure Laterality Date  . Corrective eye surgery      as a child  . Cholecystectomy    . Tonsillectomy    . Ivd removed    . Stress cardiolite  08/05/93  . Rf ablation psvt      summer '10  . Total hip arthroplasty Right 08/29/2014    Procedure: Right Hip Hemi Arthroplasty;  Surgeon: Marianna Payment, MD;  Location: [facility];  Service: Orthopedics;  Laterality: Right;  Hip procedure 1st wants [employer], [employer], [place]. Wrist - wants [employer].  Surgeon available at 1645.  Surgeon to inform sales reps.  Marland Kitchen Open reduction internal fixation (orif) distal radial fracture Right 08/29/2014  Procedure: OPEN REDUCTION INTERNAL FIXATION (ORIF) DISTAL RADIAL FRACTURE;  Surgeon: Marianna Payment, MD;  Location: [facility];  Service: Orthopedics;  Laterality: Right;    Allergies  Allergen Reactions  . Chocolate   . Codeine   . Diazepam   . Fruit & Vegetable Daily [Nutritional Supplements] Hives and Swelling    peaches  . Iodine   . Iohexol      Code: HIVES, Desc: pt gets 13 hr pre-meds, Onset Date: [id number]   . Latex Hives  . Peach [Prunus Persica]   . Peanut-Containing Drug Products Hives and Swelling  . Penicillins Hives, Itching and Swelling  .  Strawberry   . Sulfonamide Derivatives   . Wheat     Shortness of breath    I have reviewed the patient's current medications . antiseptic oral rinse  7 mL Mouth Rinse BID  . [START ON 09/26/2014] atenolol  25 mg Oral Daily  . clopidogrel  75 mg Oral Daily  . diltiazem  180 mg Oral Daily  . docusate sodium  100 mg Oral BID  . enoxaparin  40 mg Subcutaneous QHS  . levETIRAcetam  250 mg Oral BID  . levofloxacin  750 mg Oral Q48H  . potassium chloride  20 mEq Oral Daily  . rosuvastatin  10 mg Oral QHS  . sodium chloride  500 mL Intravenous Once  . sodium chloride  3 mL Intravenous Q12H     acetaminophen, albuterol, ALPRAZolam, bisacodyl, guaiFENesin-dextromethorphan, HYDROcodone-acetaminophen, ondansetron **OR** ondansetron (ZOFRAN) IV, polyethylene glycol, zolpidem  Medication Sig  ALPRAZolam (XANAX) 0.25 MG tablet Take 1 tablet (0.25 mg total) by mouth 3 (three) times daily as needed for anxiety.  aspirin 325 MG tablet Take 325 mg by mouth daily.  atenolol (TENORMIN) 50 MG tablet Take 50 mg by mouth daily.  cilostazol (PLETAL) 100 MG tablet - taken off Pletal at the time of her hip surgery  Take one-half tablet by mouth in the morning and one tablet in the evening.  diltiazem (DILACOR XR) 180 MG 24 hr capsule Take 180 mg by mouth daily.  levETIRAcetam (KEPPRA) 250 MG tablet Take 250 mg by mouth 2 (two) times daily.  rosuvastatin (CRESTOR) 5 MG tablet Take 5 mg by mouth at bedtime.  senna (SENOKOT) 8.6 MG TABS tablet Take 1 tablet (8.6 mg total) by mouth 2 (two) times daily.  zolpidem (AMBIEN) 5 MG tablet Take one tablet by mouth every night at bedtime as needed for insomnia  acetaminophen (TYLENOL) 500 MG tablet Take 1 tablet (500 mg total) by mouth every 6 (six) hours as needed.  enoxaparin (LOVENOX) 40 MG/0.4ML injection Inject 0.4 mLs (40 mg total) into the skin daily. Patient not taking: Reported on 09/21/2014  HYDROcodone-acetaminophen (NORCO/VICODIN) 5-325 MG per tablet Take  1-2 tablets by mouth every 6 (six) hours as needed for moderate pain (DO NOT EXCEED 4 GM OF TYLENOL IN 24 HOURS).  polyethylene glycol (MIRALAX / GLYCOLAX) packet Take 17 g by mouth daily as needed for mild constipation.     History   Social History  . Marital Status: Widowed    Spouse Name: N/A    Number of Children: 2  . Years of Education: 18   Occupational History  . retired    Social History Main Topics  . Smoking status: Former Smoker    Quit date: 12/08/1955  . Smokeless tobacco: Never Used     Comment: quit in 1995   . Alcohol Use: No  . Drug Use: No  .  Sexual Activity: No   Other Topics Concern  . Not on file   Social History Narrative   [employer], [employer]-BA, [employer]-early childhood. Married '55 -81 years.  1 son - 63; 1 daughter - 2; 3 grandchildren . Lives alone. ACP - discussed and provided packet on end of life care (Feb '13)   Patient is now widowed.   Patient is right-handed.   Patient drinks tea daily.    Family Status  Relation Status Death Age  . Mother Deceased 70  . Father Deceased 74  . Sister Alive   . Brother Alive    Family History  Problem Relation Age of Onset  . Coronary artery disease      family hx (also positive for lipids)  . Hypertension      family hx  . Colon cancer Neg Hx   . Breast cancer Neg Hx   . Diabetes Neg Hx   . Mental illness Father     suicide  . Arthritis Father   . Hyperlipidemia Sister   . Hypertension Sister   . Heart disease Brother     CAD/MI  . Hypertension Brother      ROS:  Full 14 point review of systems complete and found to be negative unless listed above.  Physical Exam: Blood pressure 99/40, pulse 87, temperature 99.8 F (37.7 C), temperature source Oral, resp. rate 21, height 5\' 6"  (1.676 m), weight 110 lb (49.896 kg), SpO2 90 %.  General: Well developed, well nourished, female in no acute distress Head: Left Eye PRRLA, right eye is deviated to the right, chronic. No xanthomas.    Normocephalic and atraumatic, oropharynx without edema or exudate. Dentition: Poor Lungs: Essentially clear to auscultation bilaterally Heart: Heart irregular rate and rhythm with S1, S2  murmur. pulses are 2+ all 4 extrem.   Neck: No carotid bruits. No lymphadenopathy.  JVD not elevated Abdomen: Bowel sounds present, abdomen soft and non-tender without masses or hernias noted. Msk:  No spine or cva tenderness. No weakness, no joint deformities or effusions. Extremities: No clubbing or cyanosis. No edema.  Neuro: Alert and oriented X 3. No focal deficits noted. Psych:  Good affect, responds appropriately Skin: No rashes or lesions noted.  Labs:   Lab Results  Component Value Date   WBC 5.6 09/23/2014   HGB 12.1 09/23/2014   HCT 37.4 09/23/2014   MCV 91.4 09/23/2014   PLT 158 09/23/2014    Recent Labs Lab 09/25/14 1202  NA 138  K 4.4  CL 98  CO2 25  BUN 17  CREATININE 0.98  CALCIUM 9.1  GLUCOSE 107*   PRO B NATRIURETIC PEPTIDE (BNP)  Date/Time Value Ref Range Status  09/21/2014 02:02 PM 6652.0* 0 - 450 pg/mL Final  12/03/2010 12:41 PM 81.5 0.0 - 100.0 pg/mL Final   Lab Results  Component Value Date   CHOL 141 09/24/2014   HDL 30* 09/24/2014   LDLCALC 93 09/24/2014   TRIG 89 09/24/2014   Echo: 09/22/2014 Conclusions - Left ventricle: E/e&'>18 suggestive of elevated LV filling pressures. The cavity size was normal. Systolic function was normal. The estimated ejection fraction was in the range of 55% to 60%. Wall motion was normal; there were no regional wall motion abnormalities. There was a reduced contribution of atrial contraction to ventricular filling, due to increased ventricular diastolic pressure or atrial contractile dysfunction. Doppler parameters are consistent with a reversible restrictive pattern, indicative of decreased left ventricular diastolic compliance and/or increased left atrial pressure (  grade 3 diastolic dysfunction). -  Aortic valve: There was trivial regurgitation. - Mitral valve: Mild focal calcification. There was trivial regurgitation. - Left atrium: The atrium was severely dilated.  ECG:  09/21/2014 Sinus rhythm, no acute ischemic changes Patient was placed on telemetry within the last 24 hours, was in atrial flutter at that time, rate generally well-controlled  Radiology:  Mr [name] Wo Contrast 09/23/2014   CLINICAL DATA:  80 year old female with left hand decreased sensation and weakness. Code stroke. Initial encounter. Current history of acute fractures in November treated with surgical hip replacement at that time.  EXAM: MRI HEAD WITHOUT CONTRAST  MRA HEAD WITHOUT CONTRAST  TECHNIQUE: Multiplanar, multiecho pulse sequences of the brain and surrounding structures were obtained without intravenous contrast. Angiographic images of the head were obtained using MRA technique without contrast.  COMPARISON:  Head CT without contrast 1328 hr today. Brain MRI and head and neck MRA 02/02/2012.  FINDINGS: MRI HEAD FINDINGS  Stable cerebral volume. Major intracranial vascular flow voids are stable. No restricted diffusion to suggest acute infarction. No midline shift, mass effect, evidence of mass lesion, ventriculomegaly, extra-axial collection or acute intracranial hemorrhage. Cervicomedullary junction and pituitary are within normal limits. Negative visualized cervical spine.  Scattered cerebral white matter T2 and FLAIR hyperintensity is stable. Chronic anterior right temporal lobe encephalomalacia and gliosis is stable. There is a small chronic lacunar infarct of the right caudate nucleus which is new since 2013. Elsewhere deep gray matter nuclei are stable. Brainstem and cerebellum remain within normal limits for age.  Mild mastoid effusions have not significantly changed. Dysconjugate gaze again noted with otherwise negative orbits soft tissues. Paranasal sinuses are clear. Visible internal auditory structures  appear normal. Visualized scalp soft tissues are within normal limits. Stable bone marrow signal, within normal limits.  MRA HEAD FINDINGS  Stable antegrade flow in the posterior circulation. No distal vertebral artery or basilar stenosis. Patent AICA and right PICA origins. SCA and PCA origins remain normal. Mild chronic left P2 segment stenosis is stable (series 506, image 12). Other PCA branches are within normal limits. Posterior communicating arteries are diminutive or absent.  Antegrade flow in both ICA siphons is stable. Ophthalmic artery origins are within normal limits. No siphon stenosis. Patent carotid termini. MCA and ACA origins are stable and within normal limits. Anterior communicating artery and visualized bilateral ACA branches remain normal. Visualized right MCA branches are stable and within normal limits.  Chronic left MCA M1 irregularity with mild to moderate stenosis just proximal to the left anterior temporal artery origin is stable (series 505, image 9). Visualized left MCA branches are otherwise within normal limits.  IMPRESSION: 1. No acute intracranial abnormality. Mild progression of small vessel ischemia since 2013. 2. Stable intracranial MRA, with mild to moderate left PCA and left MCA stenoses with preserved distal flow.   Electronically Signed   By: Lars Pinks M.D.   On: 09/23/2014 20:33   Dg Chest 2 View 09/23/2014   CLINICAL DATA:  Shortness of breath.  EXAM: CHEST  2 VIEW  COMPARISON:  09/21/2014.  08/28/2014.  07/16/2009.  FINDINGS: Mediastinum and hilar structures are normal. Cardiomegaly with normal pulmonary vascularity. Bibasilar atelectasis with left pleural effusion. Apical pleural thickening consistent with scarring.No pneumothorax. Cardiomegaly with normal pulmonary vascularity. No acute bony abnormality. Thoracic spine osteopenia degenerative change.  IMPRESSION: 1. Basilar atelectasis with small left pleural effusion. 2. Cardiomegaly with normal pulmonary vascularity.    Electronically Signed   By: Marcello Moores  Register  On: 09/23/2014 08:15  Ct Head Wo Contrast 09/23/2014   CLINICAL DATA:  Could stroke onset at 12:40 p.m. Left hand numbness and weakness.  EXAM: CT HEAD WITHOUT CONTRAST  TECHNIQUE: Contiguous axial images were obtained from the base of the skull through the vertex without intravenous contrast.  COMPARISON:  Brain MRI 02/02/2012 CT 05/07/1999 and  FINDINGS: No acute intracranial hemorrhage. No focal mass lesion. No CT evidence of acute infarction. No midline shift or mass effect. No hydrocephalus. Basilar cisterns are patent.  There is a remote infarction in the inferior frontotemporal lobe. There is mild periventricular white matter hypodensities.  IMPRESSION: 1. No acute cortical infarction by CT imaging. 2. No intracranial hemorrhage. 3. Remote right temporal lobe infarction. Findings conveyed toDr.  Nicole Kindred On 09/23/2014  at14:11.   Electronically Signed   By: Suzy Bouchard M.D.   On: 09/23/2014 14:10   Nm Pulmonary Perf And Vent 09/22/2014   CLINICAL DATA:  Hypoxia and shortness of breath. Multiple recent surgeries for trauma.  EXAM: NUCLEAR MEDICINE VENTILATION - PERFUSION LUNG SCAN  TECHNIQUE: Ventilation images were obtained in multiple projections using inhaled aerosol technetium 99 M DTPA. Perfusion images were obtained in multiple projections after intravenous injection of Tc-44m MAA.  RADIOPHARMACEUTICALS:  30 mCi Tc-64m DTPA aerosol and 99 mCi Tc-26m MAA  COMPARISON:  Chest radiograph same date  FINDINGS: Ventilation: Central clumping of the inhaled aerosolized radiotracer is noted as well as presumed swallowed activity within the distal esophagus. Allowing for this, no ventilation defect is identified. Mild air trapping at the bases is identified.  Perfusion: Decreased radiotracer intensity over the left lung base is identified likely indicating matched retrocardiac opacity as seen on prior exam. No focal segmental perfusion defect is identified.   IMPRESSION: Low probability for pulmonary embolism. Matched perfusion defect at the left lung base correlate with pulmonary parenchymal opacity, likely atelectasis or possibly pneumonia, as seen on the prior dissimilar exam appointment today.   Electronically Signed   By: Conchita Paris M.D.   On: 09/22/2014 12:06   Dg Chest Port 1 View 09/21/2014   CLINICAL DATA:  Initial evaluation for weakness and shortness of breath for 2 days, right hip surgery and right hand surgery on November 12th, personal history of controlled hypertension  EXAM: PORTABLE CHEST - 1 VIEW  COMPARISON:  08/28/2014  FINDINGS: Mild to moderate cardiac enlargement. Moderate vascular congestion. Stable aortic arch calcification. Bilateral perihilar vessels are indistinct. Mild blunting left costophrenic angle suggests tiny effusion.  IMPRESSION: Suspect mild pulmonary edema. Limited evaluation of retrocardiac area with opacity and blunting of the costophrenic angle. Possibility of left lower lobe pneumonia not excluded on single frontal view.   Electronically Signed   By: Skipper Cliche M.D.   On: 09/21/2014 16:00   ASSESSMENT AND PLAN:   The patient was seen today by Dr. Angelena Form, the patient evaluated and the data reviewed.  Principal Problem:   Acute respiratory failure - per IM, her respiratory status improved with treatment of her pneumonia and with diuresis.   Active Problems:   HLD (hyperlipidemia) - per IM    Anxiety - per IM    Essential hypertension - does not track her blood pressure closely at home, but feels it is generally well-controlled. She is actually hypotensive at this time, but that is secondary to diuresis    Seizures -  per IM    HCAP (healthcare-associated pneumonia) - per IM    Elevated brain natriuretic peptide (BNP) level - see below  Acute on chronic diastolic heart failure - grade 3 diastolic dysfunction on echocardiogram. Currently Lasix is on hold because of orthostatic hypotension. Her only  diuretic was 2 doses of Lasix 40 mg IV. She was not on a diuretic PTA.    TIA (transient ischemic attack) - per IM/neuro    Numbness of left hand - see above    Anticoagulation - she is a candidate for anticoagulation with an elevated CHADs2VASC score based on CHF, age, gender, hypertension. We'll start Eliquis at 2.5 mg twice a day, pharmacy to assist with any dose adjustments needed. Do not resume Pletal, DC Lovenox and Plavix once Eliquis started.   SignedRosaria Ferries, PA-C 09/25/2014 2:19 PM Beeper [phone]  I have personally seen and examined this patient with Rosaria Ferries, PA-C. I agree with the assessment and plan as outlined above. She is followed by Dr. Harrington Challenger. History of AVNRT with ablation 2010. Now with atrial flutter. Recent TIA. Rate is controlled on Cardizem. Her risk of recurrent neurological events is high with atrial flutter. I have discussed anti-coagulation with the patient and her daughter. She agrees to start a NOAC. Will start Eliquis 2.5 mg po BID (reduced dose with age over 32, weight under 60kg). In regards to her diastolic CHF, she seems to be well diuresed. She does have grade 3 diastolic dysfunction. She will need tight control of BP. Will need prn lasix and follow daily weights at home with plan for how to use her prn Lasix. Close follow up post discharge with Dr. Harrington Challenger.   Jaylon Grode 09/25/2014 3:57 PM'

## 2014-09-25 NOTE — Progress Notes (Signed)
[facility] PROGRESS NOTE Assessment/Plan: Acute hypoxic respiratory failure due to acute diastolic heart failure: - Chest x-ray on 09/23/2016 show infiltrate on the left lower lung, blood cultures were negative. - She was started empirically on vancomycin and cefepime which has been D escalated to Levaquin, VQ scan and Doppler for DVT and PE were negative. - She was started on IV Lasix and unfortunately became orthostatic Lasix was held, her diet was liberalized. - 2-D echocardiogram from a grade 3 diastolic heart failure.  Orthostatic hypotension: - Due to overdiuresis, repeated orthostatics were mildly positive. - Her dizziness has improved. I will continue to hold Lasix and can 2 to allow oral hydration.  A flutter with variable block: - Most likely due to over diuresis. Consult cardiology for further assistance. - atenolol, decrease due to orthostatic hypotension and bradycadia on 12.8.2015.  Left hand numbness: - CT head was negative for any acute bleeds , MRI negative for any acute stroke. - 2-D echo was negative for cardioembolic stroke, hemoglobin A1c was 5.5, LDL less than 100, she has been switched to Plavix. - Her carotid Dopplers less than 40% stenosis bilaterally  Anxiety: - stable-continue Xanax  Essential hypertension: - moderate control with just Cardizem-add back atenolol.  Seizures:c/w Keppra  Code Status: full Family Communication: none  Disposition Plan: inpatient   Consultants:  Neurology  cardiology  Procedures:  CT head  [facility] MRI  CXR  V/q Scan  Antibiotics:  levaquin  HPI/Subjective: Relates she stills feels dizzy.  Objective: Filed Vitals:   09/25/14 0001 09/25/14 0610 09/25/14 0751 09/25/14 0936  BP: 157/64 157/41 147/40 122/41  Pulse: 70 87 58 87  Temp:  99 F (37.2 C) 99.8 F (37.7 C)   TempSrc:  Oral Oral   Resp:  23 19   Height:      Weight:  49.896 kg (110 lb)    SpO2: 94% 93% 90%    No intake or  output data in the 24 hours ending 09/25/14 1239 Filed Weights   09/22/14 0538 09/23/14 0508 09/25/14 0610  Weight: 53.524 kg (118 lb) 53.7 kg (118 lb 6.2 oz) 49.896 kg (110 lb)    Exam:  General: Alert, awake, oriented x3, in no acute distress.  HEENT: No bruits, no goiter. -JVD Heart: irregular rate and rhythm. Lungs: Good air movement, clear Abdomen: Soft, nontender, nondistended, positive bowel sounds.    Data Reviewed: Basic Metabolic Panel:  Recent Labs Lab 09/21/14 1408 09/23/14 0444  NA 144 142  K 4.2 3.5*  CL 106 103  CO2 23 24  GLUCOSE 112* 74  BUN 13 12  CREATININE 0.80 0.90  CALCIUM 9.2 8.6   Liver Function Tests: No results for input(s): AST, ALT, ALKPHOS, BILITOT, PROT, ALBUMIN in the last 168 hours. No results for input(s): LIPASE, AMYLASE in the last 168 hours. No results for input(s): AMMONIA in the last 168 hours. CBC:  Recent Labs Lab 09/23/14 0444  WBC 5.6  HGB 12.1  HCT 37.4  MCV 91.4  PLT 158   Cardiac Enzymes: No results for input(s): CKTOTAL, CKMB, CKMBINDEX, TROPONINI in the last 168 hours. BNP (last 3 results)  Recent Labs  09/21/14 1402  PROBNP 6652.0*   CBG:  Recent Labs Lab 09/23/14 1308  GLUCAP 82    Recent Results (from the past 240 hour(s))  Blood culture (routine x 2)     Status: None (Preliminary result)   Collection Time: 09/21/14  5:41 PM  Result Value Ref Range Status  Specimen Description BLOOD LEFT FOREARM  Final   Special Requests BOTTLES DRAWN AEROBIC AND ANAEROBIC 5CC  Final   Culture  Setup Time   Final    09/22/2014 03:55 Performed at [facility]    Culture   Final           BLOOD CULTURE RECEIVED NO GROWTH TO DATE CULTURE WILL BE HELD FOR 5 DAYS BEFORE ISSUING A FINAL NEGATIVE REPORT Performed at [facility]    Report Status PENDING  Incomplete  Blood culture (routine x 2)     Status: None (Preliminary result)   Collection Time: 09/21/14  5:50 PM  Result Value Ref Range  Status   Specimen Description BLOOD LEFT HAND  Final   Special Requests BOTTLES DRAWN AEROBIC ONLY 3CC  Final   Culture  Setup Time   Final    09/22/2014 03:55 Performed at [facility]    Culture   Final           BLOOD CULTURE RECEIVED NO GROWTH TO DATE CULTURE WILL BE HELD FOR 5 DAYS BEFORE ISSUING A FINAL NEGATIVE REPORT Performed at [facility]    Report Status PENDING  Incomplete     Studies: Ct Head Wo Contrast  09/23/2014   CLINICAL DATA:  Could stroke onset at 12:40 p.m. Left hand numbness and weakness.  EXAM: CT HEAD WITHOUT CONTRAST  TECHNIQUE: Contiguous axial images were obtained from the base of the skull through the vertex without intravenous contrast.  COMPARISON:  Brain MRI 02/02/2012 CT 05/07/1999 and  FINDINGS: No acute intracranial hemorrhage. No focal mass lesion. No CT evidence of acute infarction. No midline shift or mass effect. No hydrocephalus. Basilar cisterns are patent.  There is a remote infarction in the inferior frontotemporal lobe. There is mild periventricular white matter hypodensities.  IMPRESSION: 1. No acute cortical infarction by CT imaging. 2. No intracranial hemorrhage. 3. Remote right temporal lobe infarction. Findings conveyed toDr.  Nicole Kindred On 09/23/2014  at14:11.   Electronically Signed   By: Suzy Bouchard M.D.   On: 09/23/2014 14:10   Mr [name] Wo Contrast  09/23/2014   CLINICAL DATA:  80 year old female with left hand decreased sensation and weakness. Code stroke. Initial encounter. Current history of acute fractures in November treated with surgical hip replacement at that time.  EXAM: MRI HEAD WITHOUT CONTRAST  MRA HEAD WITHOUT CONTRAST  TECHNIQUE: Multiplanar, multiecho pulse sequences of the brain and surrounding structures were obtained without intravenous contrast. Angiographic images of the head were obtained using MRA technique without contrast.  COMPARISON:  Head CT without contrast 1328 hr today. Brain MRI and head and  neck MRA 02/02/2012.  FINDINGS: MRI HEAD FINDINGS  Stable cerebral volume. Major intracranial vascular flow voids are stable. No restricted diffusion to suggest acute infarction. No midline shift, mass effect, evidence of mass lesion, ventriculomegaly, extra-axial collection or acute intracranial hemorrhage. Cervicomedullary junction and pituitary are within normal limits. Negative visualized cervical spine.  Scattered cerebral white matter T2 and FLAIR hyperintensity is stable. Chronic anterior right temporal lobe encephalomalacia and gliosis is stable. There is a small chronic lacunar infarct of the right caudate nucleus which is new since 2013. Elsewhere deep gray matter nuclei are stable. Brainstem and cerebellum remain within normal limits for age.  Mild mastoid effusions have not significantly changed. Dysconjugate gaze again noted with otherwise negative orbits soft tissues. Paranasal sinuses are clear. Visible internal auditory structures appear normal. Visualized scalp soft tissues are within normal limits.  Stable bone marrow signal, within normal limits.  MRA HEAD FINDINGS  Stable antegrade flow in the posterior circulation. No distal vertebral artery or basilar stenosis. Patent AICA and right PICA origins. SCA and PCA origins remain normal. Mild chronic left P2 segment stenosis is stable (series 506, image 12). Other PCA branches are within normal limits. Posterior communicating arteries are diminutive or absent.  Antegrade flow in both ICA siphons is stable. Ophthalmic artery origins are within normal limits. No siphon stenosis. Patent carotid termini. MCA and ACA origins are stable and within normal limits. Anterior communicating artery and visualized bilateral ACA branches remain normal. Visualized right MCA branches are stable and within normal limits.  Chronic left MCA M1 irregularity with mild to moderate stenosis just proximal to the left anterior temporal artery origin is stable (series 505, image  9). Visualized left MCA branches are otherwise within normal limits.  IMPRESSION: 1. No acute intracranial abnormality. Mild progression of small vessel ischemia since 2013. 2. Stable intracranial MRA, with mild to moderate left PCA and left MCA stenoses with preserved distal flow.   Electronically Signed   By: Lars Pinks M.D.   On: 09/23/2014 20:33   Mr Brain Wo Contrast  09/23/2014   CLINICAL DATA:  80 year old female with left hand decreased sensation and weakness. Code stroke. Initial encounter. Current history of acute fractures in November treated with surgical hip replacement at that time.  EXAM: MRI HEAD WITHOUT CONTRAST  MRA HEAD WITHOUT CONTRAST  TECHNIQUE: Multiplanar, multiecho pulse sequences of the brain and surrounding structures were obtained without intravenous contrast. Angiographic images of the head were obtained using MRA technique without contrast.  COMPARISON:  Head CT without contrast 1328 hr today. Brain MRI and head and neck MRA 02/02/2012.  FINDINGS: MRI HEAD FINDINGS  Stable cerebral volume. Major intracranial vascular flow voids are stable. No restricted diffusion to suggest acute infarction. No midline shift, mass effect, evidence of mass lesion, ventriculomegaly, extra-axial collection or acute intracranial hemorrhage. Cervicomedullary junction and pituitary are within normal limits. Negative visualized cervical spine.  Scattered cerebral white matter T2 and FLAIR hyperintensity is stable. Chronic anterior right temporal lobe encephalomalacia and gliosis is stable. There is a small chronic lacunar infarct of the right caudate nucleus which is new since 2013. Elsewhere deep gray matter nuclei are stable. Brainstem and cerebellum remain within normal limits for age.  Mild mastoid effusions have not significantly changed. Dysconjugate gaze again noted with otherwise negative orbits soft tissues. Paranasal sinuses are clear. Visible internal auditory structures appear normal. Visualized  scalp soft tissues are within normal limits. Stable bone marrow signal, within normal limits.  MRA HEAD FINDINGS  Stable antegrade flow in the posterior circulation. No distal vertebral artery or basilar stenosis. Patent AICA and right PICA origins. SCA and PCA origins remain normal. Mild chronic left P2 segment stenosis is stable (series 506, image 12). Other PCA branches are within normal limits. Posterior communicating arteries are diminutive or absent.  Antegrade flow in both ICA siphons is stable. Ophthalmic artery origins are within normal limits. No siphon stenosis. Patent carotid termini. MCA and ACA origins are stable and within normal limits. Anterior communicating artery and visualized bilateral ACA branches remain normal. Visualized right MCA branches are stable and within normal limits.  Chronic left MCA M1 irregularity with mild to moderate stenosis just proximal to the left anterior temporal artery origin is stable (series 505, image 9). Visualized left MCA branches are otherwise within normal limits.  IMPRESSION: 1. No acute intracranial abnormality.  Mild progression of small vessel ischemia since 2013. 2. Stable intracranial MRA, with mild to moderate left PCA and left MCA stenoses with preserved distal flow.   Electronically Signed   By: Lars Pinks M.D.   On: 09/23/2014 20:33    Scheduled Meds: . antiseptic oral rinse  7 mL Mouth Rinse BID  . atenolol  12.5 mg Oral Daily  . clopidogrel  75 mg Oral Daily  . diltiazem  180 mg Oral Daily  . docusate sodium  100 mg Oral BID  . enoxaparin  40 mg Subcutaneous QHS  . levETIRAcetam  250 mg Oral BID  . levofloxacin  750 mg Oral Q48H  . potassium chloride  20 mEq Oral Daily  . rosuvastatin  10 mg Oral QHS  . sodium chloride  500 mL Intravenous Once  . sodium chloride  3 mL Intravenous Q12H   Continuous Infusions:    Charlynne Cousins  [facility] Pager [phone]. If 8PM-8AM, please contact night-coverage at [email or web address],  password [facility] 09/25/2014, 12:39 PM  LOS: 4 days

## 2014-09-25 NOTE — Progress Notes (Signed)
Spoke with Pt. Pt verbally consented for Nursing staff to discuss plan of care with son Venna Berberich), daughter [name]).

## 2014-09-25 NOTE — Progress Notes (Signed)
Pt working with physical therapy. Pt orthostatic. Supine 104/37, Sitting 83/42, Standing 49/34, pt felt dizzy almost passed out. Therapy got her back to bed. Blood pressure taken again, 118/57. Pt now asymptomatic.Will notify MD. Etta Quill, RN

## 2014-09-25 NOTE — Progress Notes (Signed)
Recheked orthostatics after PT in right arm. Lying 99/40, sitting 95/42, Standing 90/42. MD notified, will continue to monitor. Etta Quill, RN

## 2014-09-25 NOTE — Plan of Care (Signed)
Problem: Phase II Progression Outcomes Goal: O2 sats > equal to 90% on RA or at baseline Outcome: Completed/Met Date Met:  09/25/14 Goal: Pain controlled on oral analgesia Outcome: Completed/Met Date Met:  09/25/14

## 2014-09-25 NOTE — Progress Notes (Signed)
STROKE TEAM PROGRESS NOTE   HISTORY Kayla Kent is an 80 y.o. female who had suffered both a distal radius and hip fracture on August 28, 2014 with surgical hip replacement on 08/29/2014. She had gone to short-term skilled nursing facility after surgical repair of her right hip and was on Lovenox at that time. She was subsequently discharged to her daughter's house where she received another week of Lovenox and then switched to full dose aspirin daily. Patient was brought to ED on 09/21/2014 due to SOB and found to have questionable LLL PNA on CXR. VQ scan and LE doppler were negative for PE or DVT. Today 09/23/2014 at 12:40 patient complained of left hand (only) decreased sensation and weakness of left hand. Code stroke was called. At time of arrival to see pt, patient was alert and oriented but still complaining of left hand decreased sensation and mild weakness. Patient was sent to CT scan and on arrival stated her strength was improving but still had decreased sensation on left hand. Patient was not administered TPA secondary to minimal symptoms which currently is improving with NIHSS1.    SUBJECTIVE (INTERVAL HISTORY) No family at bedside. Patient reports she has new atrial fibrillation this am. EKG   OBJECTIVE Temp:  [98.2 F (36.8 C)-99.8 F (37.7 C)] 99.8 F (37.7 C) (12/09 0751) Pulse Rate:  [58-87] 58 (12/09 0751) Cardiac Rhythm:  [-] Normal sinus rhythm (12/08 2000) Resp:  [18-23] 19 (12/09 0751) BP: (147-168)/(40-64) 147/40 mmHg (12/09 0751) SpO2:  [90 %-94 %] 90 % (12/09 0751) Weight:  [49.896 kg (110 lb)] 49.896 kg (110 lb) (12/09 0610)   Recent Labs Lab 09/23/14 1308  GLUCAP 82    Recent Labs Lab 09/21/14 1408 09/23/14 0444  NA 144 142  K 4.2 3.5*  CL 106 103  CO2 23 24  GLUCOSE 112* 74  BUN 13 12  CREATININE 0.80 0.90  CALCIUM 9.2 8.6   No results for input(s): AST, ALT, ALKPHOS, BILITOT, PROT, ALBUMIN in the last 168 hours.  Recent Labs Lab  09/23/14 0444  WBC 5.6  HGB 12.1  HCT 37.4  MCV 91.4  PLT 158   No results for input(s): CKTOTAL, CKMB, CKMBINDEX, TROPONINI in the last 168 hours. No results for input(s): LABPROT, INR in the last 72 hours. No results for input(s): COLORURINE, LABSPEC, Leslie, GLUCOSEU, HGBUR, BILIRUBINUR, KETONESUR, PROTEINUR, UROBILINOGEN, NITRITE, LEUKOCYTESUR in the last 72 hours.  Invalid input(s): APPERANCEUR     Component Value Date/Time   CHOL 141 09/24/2014 0009   TRIG 89 09/24/2014 0009   HDL 30* 09/24/2014 0009   CHOLHDL 4.7 09/24/2014 0009   VLDL 18 09/24/2014 0009   LDLCALC 93 09/24/2014 0009   Lab Results  Component Value Date   HGBA1C 5.5 09/23/2014   No results found for: LABOPIA, COCAINSCRNUR, LABBENZ, AMPHETMU, THCU, LABBARB  No results for input(s): ETH in the last 168 hours.  Ct Head Wo Contrast  09/23/2014   CLINICAL DATA:  Could stroke onset at 12:40 p.m. Left hand numbness and weakness.  EXAM: CT HEAD WITHOUT CONTRAST  TECHNIQUE: Contiguous axial images were obtained from the base of the skull through the vertex without intravenous contrast.  COMPARISON:  Brain MRI 02/02/2012 CT 05/07/1999 and  FINDINGS: No acute intracranial hemorrhage. No focal mass lesion. No CT evidence of acute infarction. No midline shift or mass effect. No hydrocephalus. Basilar cisterns are patent.  There is a remote infarction in the inferior frontotemporal lobe. There is mild periventricular white matter  hypodensities.  IMPRESSION: 1. No acute cortical infarction by CT imaging. 2. No intracranial hemorrhage. 3. Remote right temporal lobe infarction. Findings conveyed toDr.  Nicole Kindred On 09/23/2014  at14:11.   Electronically Signed   By: Suzy Bouchard M.D.   On: 09/23/2014 14:10   Mr [name] Wo Contrast  09/23/2014   CLINICAL DATA:  80 year old female with left hand decreased sensation and weakness. Code stroke. Initial encounter. Current history of acute fractures in November treated with surgical  hip replacement at that time.  EXAM: MRI HEAD WITHOUT CONTRAST  MRA HEAD WITHOUT CONTRAST  TECHNIQUE: Multiplanar, multiecho pulse sequences of the brain and surrounding structures were obtained without intravenous contrast. Angiographic images of the head were obtained using MRA technique without contrast.  COMPARISON:  Head CT without contrast 1328 hr today. Brain MRI and head and neck MRA 02/02/2012.  FINDINGS: MRI HEAD FINDINGS  Stable cerebral volume. Major intracranial vascular flow voids are stable. No restricted diffusion to suggest acute infarction. No midline shift, mass effect, evidence of mass lesion, ventriculomegaly, extra-axial collection or acute intracranial hemorrhage. Cervicomedullary junction and pituitary are within normal limits. Negative visualized cervical spine.  Scattered cerebral white matter T2 and FLAIR hyperintensity is stable. Chronic anterior right temporal lobe encephalomalacia and gliosis is stable. There is a small chronic lacunar infarct of the right caudate nucleus which is new since 2013. Elsewhere deep gray matter nuclei are stable. Brainstem and cerebellum remain within normal limits for age.  Mild mastoid effusions have not significantly changed. Dysconjugate gaze again noted with otherwise negative orbits soft tissues. Paranasal sinuses are clear. Visible internal auditory structures appear normal. Visualized scalp soft tissues are within normal limits. Stable bone marrow signal, within normal limits.  MRA HEAD FINDINGS  Stable antegrade flow in the posterior circulation. No distal vertebral artery or basilar stenosis. Patent AICA and right PICA origins. SCA and PCA origins remain normal. Mild chronic left P2 segment stenosis is stable (series 506, image 12). Other PCA branches are within normal limits. Posterior communicating arteries are diminutive or absent.  Antegrade flow in both ICA siphons is stable. Ophthalmic artery origins are within normal limits. No siphon  stenosis. Patent carotid termini. MCA and ACA origins are stable and within normal limits. Anterior communicating artery and visualized bilateral ACA branches remain normal. Visualized right MCA branches are stable and within normal limits.  Chronic left MCA M1 irregularity with mild to moderate stenosis just proximal to the left anterior temporal artery origin is stable (series 505, image 9). Visualized left MCA branches are otherwise within normal limits.  IMPRESSION: 1. No acute intracranial abnormality. Mild progression of small vessel ischemia since 2013. 2. Stable intracranial MRA, with mild to moderate left PCA and left MCA stenoses with preserved distal flow.   Electronically Signed   By: Lars Pinks M.D.   On: 09/23/2014 20:33   Mr Brain Wo Contrast  09/23/2014   CLINICAL DATA:  80 year old female with left hand decreased sensation and weakness. Code stroke. Initial encounter. Current history of acute fractures in November treated with surgical hip replacement at that time.  EXAM: MRI HEAD WITHOUT CONTRAST  MRA HEAD WITHOUT CONTRAST  TECHNIQUE: Multiplanar, multiecho pulse sequences of the brain and surrounding structures were obtained without intravenous contrast. Angiographic images of the head were obtained using MRA technique without contrast.  COMPARISON:  Head CT without contrast 1328 hr today. Brain MRI and head and neck MRA 02/02/2012.  FINDINGS: MRI HEAD FINDINGS  Stable cerebral volume. Major intracranial vascular  flow voids are stable. No restricted diffusion to suggest acute infarction. No midline shift, mass effect, evidence of mass lesion, ventriculomegaly, extra-axial collection or acute intracranial hemorrhage. Cervicomedullary junction and pituitary are within normal limits. Negative visualized cervical spine.  Scattered cerebral white matter T2 and FLAIR hyperintensity is stable. Chronic anterior right temporal lobe encephalomalacia and gliosis is stable. There is a small chronic lacunar  infarct of the right caudate nucleus which is new since 2013. Elsewhere deep gray matter nuclei are stable. Brainstem and cerebellum remain within normal limits for age.  Mild mastoid effusions have not significantly changed. Dysconjugate gaze again noted with otherwise negative orbits soft tissues. Paranasal sinuses are clear. Visible internal auditory structures appear normal. Visualized scalp soft tissues are within normal limits. Stable bone marrow signal, within normal limits.  MRA HEAD FINDINGS  Stable antegrade flow in the posterior circulation. No distal vertebral artery or basilar stenosis. Patent AICA and right PICA origins. SCA and PCA origins remain normal. Mild chronic left P2 segment stenosis is stable (series 506, image 12). Other PCA branches are within normal limits. Posterior communicating arteries are diminutive or absent.  Antegrade flow in both ICA siphons is stable. Ophthalmic artery origins are within normal limits. No siphon stenosis. Patent carotid termini. MCA and ACA origins are stable and within normal limits. Anterior communicating artery and visualized bilateral ACA branches remain normal. Visualized right MCA branches are stable and within normal limits.  Chronic left MCA M1 irregularity with mild to moderate stenosis just proximal to the left anterior temporal artery origin is stable (series 505, image 9). Visualized left MCA branches are otherwise within normal limits.  IMPRESSION: 1. No acute intracranial abnormality. Mild progression of small vessel ischemia since 2013. 2. Stable intracranial MRA, with mild to moderate left PCA and left MCA stenoses with preserved distal flow.   Electronically Signed   By: Lars Pinks M.D.   On: 09/23/2014 20:33   2D Echocardiogram  EF 55-60% with no source of embolus.   Carotid Doppler  There is 1-39% bilateral ICA stenosis. Vertebral artery flow is antegrade.     PHYSICAL EXAM Pleasant frail elderly Caucasian lady not in distress.Awake  alert. Afebrile. Head is nontraumatic. Neck is supple without bruit. Hearing is normal. Cardiac exam no murmur or gallop. Lungs are clear to auscultation. Distal pulses are well felt. Neurological Exam :  Awake  Alert oriented x 3. Normal speech and language.eye movements full without nystagmus.fundi were not visualized. Vision acuity and fields appear normal. Hearing is normal. Palatal movements are normal. Face symmetric. Tongue midline. Normal strength, tone, reflexes and coordination. Normal sensation. Gait deferred.   ASSESSMENT/PLAN Kayla Kent is a 80 y.o. female with new onset of left hand decreased sensation and weakness during hospitalization for workup of SOB. She did not receive IV t-PA due to minimal symptoms, NIHSS 1.   Right brain TIA  In the setting of extreme anxiety  Resultant  Neuro deficits resolved  MRI  No acute stroke. Mild progression of small vessel ischemia   MRA  No significant stenosis. mild to moderate left PCA and left MCA stenoses   Carotid Doppler  No significant stenosis   2D Echo  No source of embolus   HgbA1c 5.5  Lovenox 40 mg sq daily for VTE prophylaxis  Diet Heart thin liquids  aspirin 325 mg orally every day prior to admission, now on aspirin 325 mg orally every day. Recommend change to plavix (I changed order this am) - if  atrial fibrillation confirmed, will need anticoagulation (see below)  Ongoing aggressive stroke risk factor management  Therapy recommendations:  HH PT & OT (previously getting OP PT)  Disposition:  Return to daughter's home   Dr. Leonie Man discussed with Dr. Aileen Fass via telephone and nursing staff. Pt aware.  Follow up with Dr. Leonie Man in 1 months (I have written order).  Atrial Fibrillation  If new atrial fibrillation confirmed, will need to be on anticoagulation   Recommended NOAC   Hypertension  Stable  Hyperlipidemia  Home meds:  crestor 5,  resumed in hospital  LDL 93, goal < 70  crestor  increased to 10 yesterday  Continue statin at discharge  Other Stroke Risk Factors  Advanced age  Former Cigarette smoker, quit 58 years ago  Other Active Problems  Acute on chronic diastolic heart failure grade III  Suspected HCAP. Treated w/ abx.  Orthostatic hypotension, improving  Anxiety - pt describes herself as an anxious person  Seizures, on Keppra  Recent recent right hip fracture and right distal radius fracture  R chronic exotropia, no double vision, compensated  R arm cast from recent surgery  Hospital day # [place] for Pager information 09/25/2014 8:38 AM  I have personally examined this patient, reviewed notes, independently viewed imaging studies, participated in medical decision making and plan of care. I have made any additions or clarifications directly to the above note. Agree with note above. Stroke team will sign off, to call for questions.  Antony Contras, MD Medical Director [facility] Pager: [phone] 09/25/2014 7:14 PM     To contact Stroke Continuity provider, please refer to [email or web address] After hours, contact General Neurology

## 2014-09-25 NOTE — Progress Notes (Addendum)
Physical Therapy Treatment Patient Details Name: Kayla Kent MRN: [id number] DOB: [birth year] Today's Date: 09/25/2014    History of Present Illness With recent right hip fracture and right distal radius fracture (dc'd from acute care 11/14), chronic diastolic heart failure who comes to the emergency room with shortness of breath starting this morning. She had gone to short-term skilled nursing facility after surgical repair of her right hip and was on Lovenox at that time. She was subsequently discharged to her daughter's house. Several days ago, she started having a cough productive of yellow sputum. She started feeling weak and had difficulty bearing weight on her left leg. She woke up suddenly today with dyspnea. Worse with lying supine.  When EMS arrived, her oxygen saturations were reportedly in the 70s on room air..  Pt had episode after PT on Mon 09/23/14 with tingling in left UE.  Nursing and MD notified and MD ordered code stroke.      PT Comments    Pt admitted with above. Pt currently with functional limitations due to endurance  Deficits as well as BP issues.  Pt also with onset of afib today.  Limited mobility due to low BP. MD:  Would pt benefit from TED hose or abdominal binder given her orthostasis persisting?  Please order if you feel this would benefit pt.  Thanks. Changed PT frequency to 4xweek as this is appropriate frequency for her diagnosis.   Pt will benefit from skilled PT to increase their independence and safety with mobility to allow discharge to the venue listed below.   Follow Up Recommendations  Outpatient PT;Supervision - Intermittent     Equipment Recommendations  None recommended by PT    Recommendations for Other Services       Precautions / Restrictions Precautions Precautions: Fall Restrictions Weight Bearing Restrictions: Yes RUE Weight Bearing: Weight bearing as tolerated RLE Weight Bearing: Weight bearing as tolerated Other Position/Activity  Restrictions: no pushing, pulling, lifting with Rt hand (can hold light objects)    Mobility  Bed Mobility Overal bed mobility: Needs Assistance Bed Mobility: Supine to Sit;Sit to Supine     Supine to sit: Min assist Sit to supine: Supervision   General bed mobility comments: assist with trunk to go from supine to sitting position.   Transfers Overall transfer level: Needs assistance Equipment used: Right platform walker Transfers: Sit to/from Stand Sit to Stand: Min guard         General transfer comment: Pt orthostatic therefore did not get pt OOB to chair today.  Had to lie pt back down as BP dropped significantly and pt dizzy.    Ambulation/Gait                 Stairs            Wheelchair Mobility    Modified Rankin (Stroke Patients Only)       Balance Overall balance assessment: Needs assistance;History of Falls Sitting-balance support: No upper extremity supported;Feet supported Sitting balance-Leahy Scale: Fair     Standing balance support: Bilateral upper extremity supported;During functional activity Standing balance-Leahy Scale: Poor Standing balance comment: Needs UE support today as she was dizzy.                      Cognition Arousal/Alertness: Awake/alert Behavior During Therapy: WFL for tasks assessed/performed;Impulsive Overall Cognitive Status: Within Functional Limits for tasks assessed  Exercises      General Comments General comments (skin integrity, edema, etc.): Assisted pt putting on shoes.  Once pt back in bed, Depends were wet therefore PT changed pt and cleaned her.        Pertinent Vitals/Pain Pain Assessment: No/denies pain  Orthostatic BPs  Supine 104/37, 83bpm  Sitting 83/42, 85 bpm  Standing 49/34, 82 bpm, pt dizzy and had to lay pt down.   Standing after  min Unable due to dizziness   BP one in bed in trendelenberg 118/57 BP with HOB at 30 degrees was 112/38 on  departure.  Nursing notified and she notified MD.          Pt noted to be in afib on arrival.  Nursing aware.   Home Living                      Prior Function            PT Goals (current goals can now be found in the care plan section) Progress towards PT goals: Progressing toward goals    Frequency  Min 4X/week    PT Plan Current plan remains appropriate    Co-evaluation             End of Session Equipment Utilized During Treatment: Gait belt Activity Tolerance: Patient limited by fatigue Patient left: with call bell/phone within reach;in bed;with bed alarm set     Time: 1610-9604 PT Time Calculation (min) (ACUTE ONLY): 25 min  Charges:  $Therapeutic Activity: 8-22 mins $Self Care/Home Management: 8-22                    G CodesIrwin Brakeman F 2014-09-30, 11:09 AM [facility] [phone] (pager)

## 2014-09-26 ENCOUNTER — Telehealth: Payer: Self-pay | Admitting: *Deleted

## 2014-09-26 DIAGNOSIS — G458 Other transient cerebral ischemic attacks and related syndromes: Secondary | ICD-10-CM | POA: Insufficient documentation

## 2014-09-26 DIAGNOSIS — R799 Abnormal finding of blood chemistry, unspecified: Secondary | ICD-10-CM

## 2014-09-26 MED ORDER — APIXABAN 2.5 MG PO TABS: 2.5000 mg | ORAL_TABLET | Freq: Two times a day (BID) | ORAL | Status: DC

## 2014-09-26 MED ORDER — ATENOLOL 25 MG PO TABS: 25.0000 mg | ORAL_TABLET | Freq: Every day | ORAL | Status: DC

## 2014-09-26 MED ORDER — LEVOFLOXACIN 750 MG PO TABS: 750.0000 mg | ORAL_TABLET | ORAL | Status: DC

## 2014-09-26 NOTE — Plan of Care (Signed)
Problem: Discharge Progression Outcomes Goal: Dyspnea controlled Outcome: Completed/Met Date Met:  09/26/14 Goal: O2 sats > or equal 90% or at baseline Outcome: Completed/Met Date Met:  09/26/14 Goal: Home O2 if indicated Outcome: Completed/Met Date Met:  09/26/14 Goal: Able to self administer respiratory meds Outcome: Completed/Met Date Met:  09/26/14 Goal: Flu vaccine received if indicated Outcome: Completed/Met Date Met:  09/26/14 Goal: Pneumonia vaccine received if indicated Outcome: Completed/Met Date Met:  09/26/14 Goal: Independent ADLs or Home Health Care Outcome: Completed/Met Date Met:  09/26/14 Goal: Barriers To Progression Addressed/Resolved Outcome: Completed/Met Date Met:  09/26/14 Goal: Discharge plan in place and appropriate Outcome: Completed/Met Date Met:  09/26/14 Goal: Pain controlled with appropriate interventions Outcome: Completed/Met Date Met:  09/26/14 Goal: Tolerating diet Outcome: Completed/Met Date Met:  09/26/14 Goal: Activity appropriate for discharge plan Outcome: Completed/Met Date Met:  09/26/14 Goal: Hemodynamically stable Outcome: Completed/Met Date Met:  32/20/25 Goal: Complications resolved/controlled Outcome: Completed/Met Date Met:  09/26/14 Goal: Other Discharge Outcomes/Goals Outcome: Completed/Met Date Met:  09/26/14

## 2014-09-26 NOTE — Discharge Instructions (Addendum)
Information on my medicine - ELIQUIS (apixaban)  This medication education was reviewed with me or my healthcare representative as part of my discharge preparation.  The pharmacist that spoke with me during my hospital stay was:  [facility], Margot Chimes, [facility]  Why was Eliquis prescribed for you? Eliquis was prescribed for you to reduce the risk of a blood clot forming that can cause a stroke if you have a medical condition called atrial fibrillation (a type of irregular heartbeat).  What do You need to know about Eliquis ? Take your Eliquis TWICE DAILY - one tablet in the morning and one tablet in the evening with or without food. If you have difficulty swallowing the tablet whole please discuss with your pharmacist how to take the medication safely.  Take Eliquis exactly as prescribed by your doctor and DO NOT stop taking Eliquis without talking to the doctor who prescribed the medication.  Stopping may increase your risk of developing a stroke.  Refill your prescription before you run out.  After discharge, you should have regular check-up appointments with your healthcare provider that is prescribing your Eliquis.  In the future your dose may need to be changed if your kidney function or weight changes by a significant amount or as you get older.  What do you do if you miss a dose? If you miss a dose, take it as soon as you remember on the same day and resume taking twice daily.  Do not take more than one dose of ELIQUIS at the same time to make up a missed dose.  Important Safety Information A possible side effect of Eliquis is bleeding. You should call your healthcare provider right away if you experience any of the following: ? Bleeding from an injury or your nose that does not stop. ? Unusual colored urine (red or dark brown) or unusual colored stools (red or black). ? Unusual bruising for unknown reasons. ? A serious fall or if you hit your head (even if there is no  bleeding).  Some medicines may interact with Eliquis and might increase your risk of bleeding or clotting while on Eliquis. To help avoid this, consult your healthcare provider or pharmacist prior to using any new prescription or non-prescription medications, including herbals, vitamins, non-steroidal anti-inflammatory drugs (NSAIDs) and supplements.  This website has more information on Eliquis (apixaban): http://[email or web address].[email or web address]    STROKE/TIA DISCHARGE INSTRUCTIONS SMOKING Cigarette smoking nearly doubles your risk of having a stroke & is the single most alterable risk factor  If you smoke or have smoked in the last 12 months, you are advised to quit smoking for your health.  Most of the excess cardiovascular risk related to smoking disappears within a year of stopping.  Ask you doctor about anti-smoking medications  [place] Quit Line: 1-800-QUIT NOW  Free Smoking Cessation Classes [phone]  CHOLESTEROL Know your levels; limit fat & cholesterol in your diet  Lipid Panel     Component Value Date/Time   CHOL 141 09/24/2014 0009   TRIG 89 09/24/2014 0009   HDL 30* 09/24/2014 0009   CHOLHDL 4.7 09/24/2014 0009   VLDL 18 09/24/2014 0009   LDLCALC 93 09/24/2014 0009      Many patients benefit from treatment even if their cholesterol is at goal.  Goal: Total Cholesterol (CHOL) less than 160  Goal:  Triglycerides (TRIG) less than 150  Goal:  HDL greater than 40  Goal:  LDL (LDLCALC) less than 100   BLOOD PRESSURE American Stroke Association blood  pressure target is less that 120/80 mm/Hg  Your discharge blood pressure is:  BP: 125/60 mmHg  Monitor your blood pressure  Limit your salt and alcohol intake  Many individuals will require more than one medication for high blood pressure  DIABETES (A1c is a blood sugar average for last 3 months) Goal HGBA1c is under 7% (HBGA1c is blood sugar average for last 3 months)  Diabetes: No known diagnosis of diabetes     Lab Results  Component Value Date   HGBA1C 5.5 09/23/2014     Your HGBA1c can be lowered with medications, healthy diet, and exercise.  Check your blood sugar as directed by your physician  Call your physician if you experience unexplained or low blood sugars.  PHYSICAL ACTIVITY/REHABILITATION Goal is 30 minutes at least 4 days per week  Activity: Increase activity slowly, and Walk with assistance, Therapies: Physical Therapy: Home Health Return to work:   Activity decreases your risk of heart attack and stroke and makes your heart stronger.  It helps control your weight and blood pressure; helps you relax and can improve your mood.  Participate in a regular exercise program.  Talk with your doctor about the best form of exercise for you (dancing, walking, swimming, cycling).  DIET/WEIGHT Goal is to maintain a healthy weight  Your discharge diet is: Diet Heart Diet - low sodium heart healthy  liquids Your height is:  Height: 5\' 6"  (167.6 cm) Your current weight is: Weight: 48.807 kg (107 lb 9.6 oz) Your Body Mass Index (BMI) is:  BMI (Calculated): 18.7  Following the type of diet specifically designed for you will help prevent another stroke.  Your goal weight range is:    Your goal Body Mass Index (BMI) is 19-24.  Healthy food habits can help reduce 3 risk factors for stroke:  High cholesterol, hypertension, and excess weight.  RESOURCES Stroke/Support Group:  Call [phone]   STROKE EDUCATION PROVIDED/REVIEWED AND GIVEN TO PATIENT Stroke warning signs and symptoms How to activate emergency medical system (call 911). Medications prescribed at discharge. Need for follow-up after discharge. Personal risk factors for stroke. Pneumonia vaccine given: No Flu vaccine given: No My questions have been answered, the writing is legible, and I understand these instructions.  I will adhere to these goals & educational materials that have been provided to me after my discharge  from the hospital.

## 2014-09-26 NOTE — Progress Notes (Signed)
Subjective: No complaints  Objective: Vital signs in last 24 hours: Temp:  [98.2 F (36.8 C)-99.8 F (37.7 C)] 98.4 F (36.9 C) (12/10 0500) Pulse Rate:  [58-92] 76 (12/10 0500) Resp:  [19-26] 19 (12/10 0500) BP: (99-147)/(37-49) 141/49 mmHg (12/10 0500) SpO2:  [90 %-99 %] 94 % (12/10 0500) Weight:  [107 lb 9.6 oz (48.807 kg)] 107 lb 9.6 oz (48.807 kg) (12/10 0500) Weight change: -2 lb 6.4 oz (-1.089 kg) Last BM Date: 09/20/14 (last good BM this date per patient report) Intake/Output from previous day: 12/09 0701 - 12/10 0700 In: 600 [P.O.:600] Out: -  Intake/Output this shift:    PE: General:Pleasant affect, NAD Skin:Warm and dry, brisk capillary refill HEENT:normocephalic, sclera clear, mucus membranes moist Heart:S1S2irreg irreg without murmur, gallup, rub or click Lungs:clear without rales, rhonchi, or wheezes ALP:FXTK, non tender, + BS, do not palpate liver spleen or masses Ext:no lower ext edema, 2+ pedal pulses, 2+ radial pulses, cast on rt wrist Neuro:alert and oriented, MAE, follows commands, + facial symmetry  tele:  A flutter rate controlled  Lab Results: No results for input(s): WBC, HGB, HCT, PLT in the last 72 hours. BMET  Recent Labs  09/25/14 1202  NA 138  K 4.4  CL 98  CO2 25  GLUCOSE 107*  BUN 17  CREATININE 0.98  CALCIUM 9.1   No results for input(s): TROPONINI in the last 72 hours.  Invalid input(s): CK, MB  Lab Results  Component Value Date   CHOL 141 09/24/2014   HDL 30* 09/24/2014   LDLCALC 93 09/24/2014   LDLDIRECT 175.3 01/19/2008   TRIG 89 09/24/2014   CHOLHDL 4.7 09/24/2014   Lab Results  Component Value Date   HGBA1C 5.5 09/23/2014       Hepatic Function Panel No results for input(s): PROT, ALBUMIN, AST, ALT, ALKPHOS, BILITOT, BILIDIR, IBILI in the last 72 hours.  Recent Labs  09/24/14 0009  CHOL 141   No results for input(s): PROTIME in the last 72 hours.     Studies/Results: No results  found.  Medications: I have reviewed the patient's current medications. Scheduled Meds: . antiseptic oral rinse  7 mL Mouth Rinse BID  . apixaban  2.5 mg Oral BID  . atenolol  25 mg Oral Daily  . diltiazem  180 mg Oral Daily  . docusate sodium  100 mg Oral BID  . levETIRAcetam  250 mg Oral BID  . levofloxacin  750 mg Oral Q48H  . potassium chloride  20 mEq Oral Daily  . rosuvastatin  10 mg Oral QHS  . sodium chloride  500 mL Intravenous Once  . sodium chloride  3 mL Intravenous Q12H   Continuous Infusions:  PRN Meds:.acetaminophen, albuterol, ALPRAZolam, bisacodyl, guaiFENesin-dextromethorphan, HYDROcodone-acetaminophen, ondansetron **OR** ondansetron (ZOFRAN) IV, polyethylene glycol, zolpidem  Assessment/Plan:  80 y.o. 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  12/05 with shortness of breath. She was admitted for left lower lobe pneumonia. On 12/07, she developed left hand paresthesias he is and weakness. A code stroke was called. She did not require TPA and improved rapidly. Dr. Leonie Man suspected she had a small right brain subcortical TIA due to small vessel disease, stroke workup ongoing. EF preserved on echocardiogram with grade 3 diastolic dysfunction and a severely dilated left atrium.  We were consulted for a flutter with variable block.  NOAC-  Eliquis was started pletal, lovenox and plavix were stopped.    Principal Problem:  Acute respiratory failure - per IM, her respiratory status improved with treatment of her pneumonia and with diuresis.   Active Problems:   Atrial flutter- rate controlled, variable rates, on cardizem and tenormin. Will follow up with Dr. Harrington Challenger.  Monitor HR with exertion.    HLD (hyperlipidemia) - per IM   Anxiety - per IM   Essential hypertension - does not track her blood pressure closely at home, but feels it is generally well-controlled.  She is actually hypotensive at this time, but that is secondary to diuresis  Currently upper normal.   Seizures - per IM   HCAP (healthcare-associated pneumonia) - per IM   Elevated brain natriuretic peptide (BNP) level - see below   Acute on chronic diastolic heart failure - grade 3 diastolic dysfunction on echocardiogram. Currently Lasix is on hold because of orthostatic hypotension. Her only diuretic was 2 doses of Lasix 40 mg IV. She was not on a diuretic PTA.   TIA (transient ischemic attack) - per IM/neuro   Numbness of left hand - see above- per neuro   Anticoagulation - she is a candidate for anticoagulation with an elevated CHADs2VASC score 6  based on age, gender, hypertension. We'll start Eliquis at 2.5 mg twice a day, pharmacy to assist with any dose adjustments needed. Do not resume Pletal, DC Lovenox and Plavix once Eliquis started.      LOS: 5 days   Time spent with pt. :15 minutes. [facility]  Nurse Practitioner Certified Pager [phone] or after 5pm and on weekends call [phone] 09/26/2014, 7:46 AM    Agree with note written by Cecilie Kicks RNP  Pt with H/O AVNRT ablation in past. Admitted with respiratory issues HCAP and developed aflutter with CVR. She also had a TIA presumably thromboembolic evaluated by Stroke service. Oral AC recommended (Eliquis). BP better today and rate controlled. Will continue to follow. Hopefully will convert once PNA Rx. If not can be cardioverted as OP assuming she continues to tolerate rhythm. On atenolol 25 mg currently (was on 50 mg qd at home).  Lorretta Harp 09/26/2014 9:02 AM

## 2014-09-26 NOTE — Progress Notes (Signed)
STROKE TEAM PROGRESS NOTE   HISTORY Kayla Kent is an 80 y.o. female who had suffered both a distal radius and hip fracture on August 28, 2014 with surgical hip replacement on 08/29/2014. She had gone to short-term skilled nursing facility after surgical repair of her right hip and was on Lovenox at that time. She was subsequently discharged to her daughter's house where she received another week of Lovenox and then switched to full dose aspirin daily. Patient was brought to ED on 09/21/2014 due to SOB and found to have questionable LLL PNA on CXR. VQ scan and LE doppler were negative for PE or DVT. Today 09/23/2014 at 12:40 patient complained of left hand (only) decreased sensation and weakness of left hand. Code stroke was called. At time of arrival to see pt, patient was alert and oriented but still complaining of left hand decreased sensation and mild weakness. Patient was sent to CT scan and on arrival stated her strength was improving but still had decreased sensation on left hand. Patient was not administered TPA secondary to minimal symptoms which currently is improving with NIHSS1.    SUBJECTIVE (INTERVAL HISTORY) No family at bedside. Pt started on eliquis for new onset afib/flutter and has been seen by cardiology  OBJECTIVE Temp:  [98 F (36.7 C)-98.8 F (37.1 C)] 98 F (36.7 C) (12/10 0746) Pulse Rate:  [76-92] 88 (12/10 0746) Cardiac Rhythm:  [-]  Resp:  [19-26] 22 (12/10 0840) BP: (99-146)/(35-51) 130/35 mmHg (12/10 0840) SpO2:  [93 %-99 %] 93 % (12/10 0746) Weight:  [48.807 kg (107 lb 9.6 oz)] 48.807 kg (107 lb 9.6 oz) (12/10 0500)   Recent Labs Lab 09/23/14 1308  GLUCAP 82    Recent Labs Lab 09/21/14 1408 09/23/14 0444 09/25/14 1202  NA 144 142 138  K 4.2 3.5* 4.4  CL 106 103 98  CO2 23 24 25   GLUCOSE 112* 74 107*  BUN 13 12 17   CREATININE 0.80 0.90 0.98  CALCIUM 9.2 8.6 9.1   No results for input(s): AST, ALT, ALKPHOS, BILITOT, PROT, ALBUMIN in the  last 168 hours.  Recent Labs Lab 09/23/14 0444  WBC 5.6  HGB 12.1  HCT 37.4  MCV 91.4  PLT 158   No results for input(s): CKTOTAL, CKMB, CKMBINDEX, TROPONINI in the last 168 hours. No results for input(s): LABPROT, INR in the last 72 hours. No results for input(s): COLORURINE, LABSPEC, [address], GLUCOSEU, HGBUR, BILIRUBINUR, KETONESUR, PROTEINUR, UROBILINOGEN, NITRITE, LEUKOCYTESUR in the last 72 hours.  Invalid input(s): APPERANCEUR     Component Value Date/Time   CHOL 141 09/24/2014 0009   TRIG 89 09/24/2014 0009   HDL 30* 09/24/2014 0009   CHOLHDL 4.7 09/24/2014 0009   VLDL 18 09/24/2014 0009   LDLCALC 93 09/24/2014 0009   Lab Results  Component Value Date   HGBA1C 5.5 09/23/2014   No results found for: LABOPIA, COCAINSCRNUR, LABBENZ, AMPHETMU, THCU, LABBARB  No results for input(s): ETH in the last 168 hours.  No results found. 2D Echocardiogram  EF 55-60% with no source of embolus.   Carotid Doppler  There is 1-39% bilateral ICA stenosis. Vertebral artery flow is antegrade.     PHYSICAL EXAM Pleasant frail elderly Caucasian lady not in distress.Awake alert. Afebrile. Head is nontraumatic. Neck is supple without bruit. Hearing is normal. Cardiac exam no murmur or gallop. Lungs are clear to auscultation. Distal pulses are well felt. Neurological Exam :  Awake  Alert oriented x 3. Normal speech and language.eye movements full  without nystagmus.fundi were not visualized. Vision acuity and fields appear normal. Hearing is normal. Palatal movements are normal. Face symmetric. Tongue midline. Normal strength, tone, reflexes and coordination. Normal sensation. Gait deferred.   ASSESSMENT/PLAN Kayla Kent is a 80 y.o. female with new onset of left hand decreased sensation and weakness during hospitalization for workup of SOB. She did not receive IV t-PA due to minimal symptoms, NIHSS 1.   Right brain TIA  In the setting of extreme anxiety  Resultant  Neuro  deficits resolved  MRI  No acute stroke. Mild progression of small vessel ischemia   MRA  No significant stenosis. mild to moderate left PCA and left MCA stenoses   Carotid Doppler  No significant stenosis   2D Echo  No source of embolus   HgbA1c 5.5  Lovenox 40 mg sq daily for VTE prophylaxis  Diet Heart thin liquids  aspirin 325 mg orally every day prior to admission, initially changed to plavix but with new onset atrial fibrillation, changed to eliquis (apixaban).   Ongoing aggressive stroke risk factor management  Therapy recommendations:  HH PT & OT (previously getting OP PT)  Disposition:  Return to daughter's home   Follow up with Dr. Leonie Man in 1 months (I have written order).  Stroke team will sign off. Please call for any questions.  Atrial Flutter  Cardiology consulted  Changed to Eliquis 2.5 mg bid  Hypertension  Stable  Hyperlipidemia  Home meds:  crestor 5,  resumed in hospital  LDL 93, goal < 70  crestor increased to 10 yesterday  Continue statin at discharge  Other Stroke Risk Factors  Advanced age  Former Cigarette smoker, quit 58 years ago  Other Active Problems  Acute on chronic diastolic heart failure grade III  Suspected HCAP. Treated w/ abx.  Orthostatic hypotension, improving  Anxiety - pt describes herself as an anxious person  Seizures, on Keppra  Recent recent right hip fracture and right distal radius fracture  R chronic exotropia, no double vision, compensated  R arm cast from recent surgery  Hospital day # [place] for Pager information 09/26/2014 9:51 AM  I have personally examined this patient, reviewed notes, independently viewed imaging studies, participated in medical decision making and plan of care. I have made any additions or clarifications directly to the above note. Agree with note above.  Stroke team will sign off. Follow up in stroke clinic in 1  month.  Antony Contras, MD Medical Director [facility] Pager: [phone] 09/26/2014 3:24 PM     To contact Stroke Continuity provider, please refer to [email or web address] After hours, contact General Neurology

## 2014-09-26 NOTE — Discharge Summary (Addendum)
Physician Discharge Summary  Kayla Kent YHC:[id number] DOB: [birth year] DOA: 09/21/2014  PCP: Cathlean Cower, MD  Admit date: 09/21/2014 Discharge date: 09/26/2014  Time spent: 35 minutes  Recommendations for Outpatient Follow-up:  1. Follow upw ith Dr. Leonie Man in 1 week. 2. Follow up with Cardiology ion 1 week.  Discharge Diagnoses:  Principal Problem:   Acute respiratory failure Active Problems:   HLD (hyperlipidemia)   Anxiety   Essential hypertension   Seizures   HCAP (healthcare-associated pneumonia)   Elevated brain natriuretic peptide (BNP) level   Acute on chronic diastolic heart failure   TIA (transient ischemic attack)   Numbness of left hand   Other specified transient cerebral ischemias   Discharge Condition: stable  Diet recommendation: heart healthy  Filed Weights   09/23/14 0508 09/25/14 0610 09/26/14 0500  Weight: 53.7 kg (118 lb 6.2 oz) 49.896 kg (110 lb) 48.807 kg (107 lb 9.6 oz)    History of present illness:  80 y.o. female  With recent right hip fracture and right distal radius fracture, chronic diastolic heart failure who comes to the emergency room with shortness of breath starting this morning. She had gone to short-term skilled nursing facility after surgical repair of her right hip and was on Lovenox at that time. She was subsequently discharged to her daughter's house where she received another week of Lovenox and then switched to full dose aspirin daily. Several days ago, she started having a cough productive of yellow sputum. She started feeling weak and had difficulty bearing weight on her left leg. She denies fevers chills rhinorrhea sore throat chest pain leg pain or swelling. She woke up suddenly today with dyspnea. Worse with lying supine. When EMS arrived, her oxygen saturations were reportedly in the 70s on room air. She came to the emergency room on a nonrebreather mask.  Hospital Course:  Acute hypoxic respiratory failure due to acute  diastolic heart failure: - Chest x-ray on 09/23/2016 show infiltrate on the left lower lung, blood cultures were negative. - She was started empirically on vancomycin and cefepime which has been Descalated to Levaquin, VQ scan and Doppler for DVT and PE were negative. - She was started on IV Lasix and unfortunately became orthostatic Lasix was held, her diet was liberalized. - 2-D echocardiogram from a grade 3 diastolic heart failure.  Orthostatic hypotension: - Due to overdiuresis, repeated orthostatics were resolved - Her dizziness has improved. I will continue to hold Lasix. - follow up with cards in 1 week.  A flutter with variable block: - Most likely due to over diuresis. Consult cardiology for further assistance. - atenolol, decrease due to orthostatic hypotension and bradycadia on 12.8.2015. - cardiology agree to start her on eluquis.  Left hand numbness: - CT head was negative for any acute bleeds , MRI negative for any acute stroke. - 2-D echo was negative for cardioembolic stroke, hemoglobin A1c was 5.5, LDL less than 100, she has been switched to Plavix. - Her carotid Dopplers less than 40% stenosis bilaterally  Anxiety: - stable-continue Xanax  Essential hypertension: - moderate control with just Cardizem-add back atenolol.  Seizures:c/w Keppra  Procedures:  MRI brain  Consultations:  Cardiology  neurology  Discharge Exam: Filed Vitals:   09/26/14 1131  BP: 125/60  Pulse: 67  Temp: 98.2 F (36.8 C)  Resp: 23    General: A&O x3 Cardiovascular: RRR Respiratory: good air movement CTA B/L  Discharge Instructions You were cared for by a hospitalist during your hospital stay. If  you have any questions about your discharge medications or the care you received while you were in the hospital after you are discharged, you can call the unit and asked to speak with the hospitalist on call if the hospitalist that took care of you is not available. Once you are  discharged, your primary care physician will handle any further medical issues. Please note that NO REFILLS for any discharge medications will be authorized once you are discharged, as it is imperative that you return to your primary care physician (or establish a relationship with a primary care physician if you do not have one) for your aftercare needs so that they can reassess your need for medications and monitor your lab values.  Discharge Instructions    Ambulatory referral to Neurology    Complete by:  As directed   Stroke patient. Dr. Leonie Man prefers follow up in 1 month     Diet - low sodium heart healthy    Complete by:  As directed      Increase activity slowly    Complete by:  As directed           Current Discharge Medication List    START taking these medications   Details  apixaban (ELIQUIS) 2.5 MG TABS tablet Take 1 tablet (2.5 mg total) by mouth 2 (two) times daily. Qty: 60 tablet, Refills: 0    levofloxacin (LEVAQUIN) 750 MG tablet Take 1 tablet (750 mg total) by mouth every other day. Qty: 3 tablet, Refills: 0      CONTINUE these medications which have CHANGED   Details  atenolol (TENORMIN) 25 MG tablet Take 1 tablet (25 mg total) by mouth daily. Qty: 30 tablet, Refills: 0      CONTINUE these medications which have NOT CHANGED   Details  ALPRAZolam (XANAX) 0.25 MG tablet Take 1 tablet (0.25 mg total) by mouth 3 (three) times daily as needed for anxiety. Qty: 90 tablet, Refills: 5    diltiazem (DILACOR XR) 180 MG 24 hr capsule Take 180 mg by mouth daily.    levETIRAcetam (KEPPRA) 250 MG tablet Take 250 mg by mouth 2 (two) times daily.    rosuvastatin (CRESTOR) 5 MG tablet Take 5 mg by mouth at bedtime.    senna (SENOKOT) 8.6 MG TABS tablet Take 1 tablet (8.6 mg total) by mouth 2 (two) times daily. Qty: 120 each, Refills: 0    zolpidem (AMBIEN) 5 MG tablet Take one tablet by mouth every night at bedtime as needed for insomnia Qty: 30 tablet, Refills: 5     acetaminophen (TYLENOL) 500 MG tablet Take 1 tablet (500 mg total) by mouth every 6 (six) hours as needed. Qty: 30 tablet, Refills: 0    HYDROcodone-acetaminophen (NORCO/VICODIN) 5-325 MG per tablet Take 1-2 tablets by mouth every 6 (six) hours as needed for moderate pain (DO NOT EXCEED 4 GM OF TYLENOL IN 24 HOURS). Qty: 120 tablet, Refills: 0    polyethylene glycol (MIRALAX / GLYCOLAX) packet Take 17 g by mouth daily as needed for mild constipation. Qty: 14 each, Refills: 0      STOP taking these medications     aspirin 325 MG tablet      cilostazol (PLETAL) 100 MG tablet      enoxaparin (LOVENOX) 40 MG/0.4ML injection        Allergies  Allergen Reactions  . Chocolate   . Codeine   . Diazepam   . Fruit & Vegetable Daily [Nutritional Supplements] Hives and Swelling  peaches  . Iodine   . Iohexol      Code: HIVES, Desc: pt gets 13 hr pre-meds, Onset Date: [id number]   . Latex Hives  . Peach [Prunus Persica]   . Peanut-Containing Drug Products Hives and Swelling  . Penicillins Hives, Itching and Swelling  . Strawberry   . Sulfonamide Derivatives   . Wheat     Shortness of breath   Follow-up Information    Follow up with [facility].   Why:  Physcial and Occupational Therapy   Contact information:   [address] [place] [address] [phone]       Follow up with Cathlean Cower, MD On 09/27/2014.   Specialties:  Internal Medicine, Radiology   Why:  appt at 10:30 am.   Contact information:   [place] [address] [phone]       Follow up with SETHI,PRAMOD, MD In 1 month.   Specialties:  Neurology, Radiology   Why:  Stroke Clinic, Office will call you with appointment date & time   Contact information:   [address] [place] [address] [phone]        The results of significant diagnostics from this hospitalization (including imaging, microbiology, ancillary and laboratory) are  listed below for reference.    Significant Diagnostic Studies: Dg Chest 2 View  09/23/2014   CLINICAL DATA:  Shortness of breath.  EXAM: CHEST  2 VIEW  COMPARISON:  09/21/2014.  08/28/2014.  07/16/2009.  FINDINGS: Mediastinum and hilar structures are normal. Cardiomegaly with normal pulmonary vascularity. Bibasilar atelectasis with left pleural effusion. Apical pleural thickening consistent with scarring.No pneumothorax. Cardiomegaly with normal pulmonary vascularity. No acute bony abnormality. Thoracic spine osteopenia degenerative change.  IMPRESSION: 1. Basilar atelectasis with small left pleural effusion. 2. Cardiomegaly with normal pulmonary vascularity.   Electronically Signed   By: Marcello Moores  Register   On: 09/23/2014 08:15   Dg Wrist Complete Right  08/28/2014   CLINICAL DATA:  Fall while walking this afternoon. Initial evaluation.  EXAM: RIGHT WRIST - COMPLETE 3+ VIEW  COMPARISON:  None.  FINDINGS: Comminuted posterior angulated displaced distal right radial fracture with extension to the radiocarpal joint space noted. Displaced ulnar styloid fracture.  IMPRESSION: Comminuted displaced posteriorly angulated fracture of the distal radius with extension to the radiocarpal joint space. Displaced ulnar styloid fracture.   Electronically Signed   By: Marcello Moores  Register   On: 08/28/2014 18:48   Dg Hip Complete Right  08/28/2014   CLINICAL DATA:  Fall wall wall and this afternoon. Right lateral hip pain. Initial encounter.  EXAM: RIGHT HIP - COMPLETE 2+ VIEW  COMPARISON:  None.  FINDINGS: There is a right femoral neck fracture with varus angulation. No subluxation or dislocation. Left hip and SI joints are unremarkable.  IMPRESSION: Right femoral neck fracture with varus angulation.   Electronically Signed   By: Rolm Baptise M.D.   On: 08/28/2014 18:48   Dg Hip Operative Right  08/29/2014   CLINICAL DATA:  Right femoral neck fracture. Arthroplasty. Initial encounter.  EXAM: DG OPERATIVE RIGHT HIP 1-2  VIEWS  TECHNIQUE: Fluoroscopic spot image(s) were submitted for interpretation post-operatively.  COMPARISON:  Radiographs 08/28/2014.  FINDINGS: Two spot fluoroscopic images of the right hip demonstrate interval resection of the right femoral head and bipolar hemiarthroplasty. The hardware appears well positioned. There is no evidence of acute fracture or dislocation. There is gas within the operative bed.  IMPRESSION: Intraoperative views following right  hip hemiarthroplasty. No demonstrated complication.   Electronically Signed   By: Camie Patience M.D.   On: 08/29/2014 20:12   Ct Head Wo Contrast  09/23/2014   CLINICAL DATA:  Could stroke onset at 12:40 p.m. Left hand numbness and weakness.  EXAM: CT HEAD WITHOUT CONTRAST  TECHNIQUE: Contiguous axial images were obtained from the base of the skull through the vertex without intravenous contrast.  COMPARISON:  Brain MRI 02/02/2012 CT 05/07/1999 and  FINDINGS: No acute intracranial hemorrhage. No focal mass lesion. No CT evidence of acute infarction. No midline shift or mass effect. No hydrocephalus. Basilar cisterns are patent.  There is a remote infarction in the inferior frontotemporal lobe. There is mild periventricular white matter hypodensities.  IMPRESSION: 1. No acute cortical infarction by CT imaging. 2. No intracranial hemorrhage. 3. Remote right temporal lobe infarction. Findings conveyed toDr.  Nicole Kindred On 09/23/2014  at14:11.   Electronically Signed   By: Suzy Bouchard M.D.   On: 09/23/2014 14:10   Mr [name] Wo Contrast  09/23/2014   CLINICAL DATA:  80 year old female with left hand decreased sensation and weakness. Code stroke. Initial encounter. Current history of acute fractures in November treated with surgical hip replacement at that time.  EXAM: MRI HEAD WITHOUT CONTRAST  MRA HEAD WITHOUT CONTRAST  TECHNIQUE: Multiplanar, multiecho pulse sequences of the brain and surrounding structures were obtained without intravenous contrast.  Angiographic images of the head were obtained using MRA technique without contrast.  COMPARISON:  Head CT without contrast 1328 hr today. Brain MRI and head and neck MRA 02/02/2012.  FINDINGS: MRI HEAD FINDINGS  Stable cerebral volume. Major intracranial vascular flow voids are stable. No restricted diffusion to suggest acute infarction. No midline shift, mass effect, evidence of mass lesion, ventriculomegaly, extra-axial collection or acute intracranial hemorrhage. Cervicomedullary junction and pituitary are within normal limits. Negative visualized cervical spine.  Scattered cerebral white matter T2 and FLAIR hyperintensity is stable. Chronic anterior right temporal lobe encephalomalacia and gliosis is stable. There is a small chronic lacunar infarct of the right caudate nucleus which is new since 2013. Elsewhere deep gray matter nuclei are stable. Brainstem and cerebellum remain within normal limits for age.  Mild mastoid effusions have not significantly changed. Dysconjugate gaze again noted with otherwise negative orbits soft tissues. Paranasal sinuses are clear. Visible internal auditory structures appear normal. Visualized scalp soft tissues are within normal limits. Stable bone marrow signal, within normal limits.  MRA HEAD FINDINGS  Stable antegrade flow in the posterior circulation. No distal vertebral artery or basilar stenosis. Patent AICA and right PICA origins. SCA and PCA origins remain normal. Mild chronic left P2 segment stenosis is stable (series 506, image 12). Other PCA branches are within normal limits. Posterior communicating arteries are diminutive or absent.  Antegrade flow in both ICA siphons is stable. Ophthalmic artery origins are within normal limits. No siphon stenosis. Patent carotid termini. MCA and ACA origins are stable and within normal limits. Anterior communicating artery and visualized bilateral ACA branches remain normal. Visualized right MCA branches are stable and within  normal limits.  Chronic left MCA M1 irregularity with mild to moderate stenosis just proximal to the left anterior temporal artery origin is stable (series 505, image 9). Visualized left MCA branches are otherwise within normal limits.  IMPRESSION: 1. No acute intracranial abnormality. Mild progression of small vessel ischemia since 2013. 2. Stable intracranial MRA, with mild to moderate left PCA and left MCA stenoses with preserved distal flow.   Electronically  Signed   By: Lars Pinks M.D.   On: 09/23/2014 20:33   Mr Brain Wo Contrast  09/23/2014   CLINICAL DATA:  80 year old female with left hand decreased sensation and weakness. Code stroke. Initial encounter. Current history of acute fractures in November treated with surgical hip replacement at that time.  EXAM: MRI HEAD WITHOUT CONTRAST  MRA HEAD WITHOUT CONTRAST  TECHNIQUE: Multiplanar, multiecho pulse sequences of the brain and surrounding structures were obtained without intravenous contrast. Angiographic images of the head were obtained using MRA technique without contrast.  COMPARISON:  Head CT without contrast 1328 hr today. Brain MRI and head and neck MRA 02/02/2012.  FINDINGS: MRI HEAD FINDINGS  Stable cerebral volume. Major intracranial vascular flow voids are stable. No restricted diffusion to suggest acute infarction. No midline shift, mass effect, evidence of mass lesion, ventriculomegaly, extra-axial collection or acute intracranial hemorrhage. Cervicomedullary junction and pituitary are within normal limits. Negative visualized cervical spine.  Scattered cerebral white matter T2 and FLAIR hyperintensity is stable. Chronic anterior right temporal lobe encephalomalacia and gliosis is stable. There is a small chronic lacunar infarct of the right caudate nucleus which is new since 2013. Elsewhere deep gray matter nuclei are stable. Brainstem and cerebellum remain within normal limits for age.  Mild mastoid effusions have not significantly changed.  Dysconjugate gaze again noted with otherwise negative orbits soft tissues. Paranasal sinuses are clear. Visible internal auditory structures appear normal. Visualized scalp soft tissues are within normal limits. Stable bone marrow signal, within normal limits.  MRA HEAD FINDINGS  Stable antegrade flow in the posterior circulation. No distal vertebral artery or basilar stenosis. Patent AICA and right PICA origins. SCA and PCA origins remain normal. Mild chronic left P2 segment stenosis is stable (series 506, image 12). Other PCA branches are within normal limits. Posterior communicating arteries are diminutive or absent.  Antegrade flow in both ICA siphons is stable. Ophthalmic artery origins are within normal limits. No siphon stenosis. Patent carotid termini. MCA and ACA origins are stable and within normal limits. Anterior communicating artery and visualized bilateral ACA branches remain normal. Visualized right MCA branches are stable and within normal limits.  Chronic left MCA M1 irregularity with mild to moderate stenosis just proximal to the left anterior temporal artery origin is stable (series 505, image 9). Visualized left MCA branches are otherwise within normal limits.  IMPRESSION: 1. No acute intracranial abnormality. Mild progression of small vessel ischemia since 2013. 2. Stable intracranial MRA, with mild to moderate left PCA and left MCA stenoses with preserved distal flow.   Electronically Signed   By: Lars Pinks M.D.   On: 09/23/2014 20:33   Pelvis Portable  08/29/2014   CLINICAL DATA:  Status post right hip replacement after intertrochanteric fracture.  EXAM: PORTABLE PELVIS 1-2 VIEWS  COMPARISON:  Plain films of right hip 08/28/2014.  FINDINGS: Bipolar right hip hemiarthroplasty is in place. The device is located. No fracture is seen. Surgical staples and gas in the soft tissues are noted.  IMPRESSION: Right hip replacement without evidence of complication.   Electronically Signed   By: Inge Rise M.D.   On: 08/29/2014 23:40   Nm Pulmonary Perf And Vent  09/22/2014   CLINICAL DATA:  Hypoxia and shortness of breath. Multiple recent surgeries for trauma.  EXAM: NUCLEAR MEDICINE VENTILATION - PERFUSION LUNG SCAN  TECHNIQUE: Ventilation images were obtained in multiple projections using inhaled aerosol technetium 99 M DTPA. Perfusion images were obtained in multiple projections after intravenous injection  of Tc-17m MAA.  RADIOPHARMACEUTICALS:  30 mCi Tc-55m DTPA aerosol and 99 mCi Tc-22m MAA  COMPARISON:  Chest radiograph same date  FINDINGS: Ventilation: Central clumping of the inhaled aerosolized radiotracer is noted as well as presumed swallowed activity within the distal esophagus. Allowing for this, no ventilation defect is identified. Mild air trapping at the bases is identified.  Perfusion: Decreased radiotracer intensity over the left lung base is identified likely indicating matched retrocardiac opacity as seen on prior exam. No focal segmental perfusion defect is identified.  IMPRESSION: Low probability for pulmonary embolism. Matched perfusion defect at the left lung base correlate with pulmonary parenchymal opacity, likely atelectasis or possibly pneumonia, as seen on the prior dissimilar exam appointment today.   Electronically Signed   By: Conchita Paris M.D.   On: 09/22/2014 12:06   Dg Chest Port 1 View  09/21/2014   CLINICAL DATA:  Initial evaluation for weakness and shortness of breath for 2 days, right hip surgery and right hand surgery on November 12th, personal history of controlled hypertension  EXAM: PORTABLE CHEST - 1 VIEW  COMPARISON:  08/28/2014  FINDINGS: Mild to moderate cardiac enlargement. Moderate vascular congestion. Stable aortic arch calcification. Bilateral perihilar vessels are indistinct. Mild blunting left costophrenic angle suggests tiny effusion.  IMPRESSION: Suspect mild pulmonary edema. Limited evaluation of retrocardiac area with opacity and blunting of  the costophrenic angle. Possibility of left lower lobe pneumonia not excluded on single frontal view.   Electronically Signed   By: Skipper Cliche M.D.   On: 09/21/2014 16:00   Dg Chest Port 1 View  08/28/2014   CLINICAL DATA:  Preop evaluation following hip fracture, hypertension  EXAM: PORTABLE CHEST - 1 VIEW  COMPARISON:  07/16/2009  FINDINGS: Cardiac shadow is stable. The lungs are again hyperinflated. Mild increase in central vascular congestion is noted. Mild scarring is noted in the left lung base. No focal confluent infiltrate is seen.  IMPRESSION: Mild vascular congestion.  No other acute abnormality is noted   Electronically Signed   By: Inez Catalina M.D.   On: 08/28/2014 19:21   Dg Femur Right Port  08/28/2014   CLINICAL DATA:  Fall with right hip pain.  EXAM: PORTABLE RIGHT FEMUR - 2 VIEW  COMPARISON:  Right hip same day.  FINDINGS: Images of the right femur excluding the right hip demonstrate no additional fracture sites. Remainder the exam is unremarkable.  IMPRESSION: No acute findings.   Electronically Signed   By: Marin Olp M.D.   On: 08/28/2014 20:37    Microbiology: Recent Results (from the past 240 hour(s))  Blood culture (routine x 2)     Status: None (Preliminary result)   Collection Time: 09/21/14  5:41 PM  Result Value Ref Range Status   Specimen Description BLOOD LEFT FOREARM  Final   Special Requests BOTTLES DRAWN AEROBIC AND ANAEROBIC 5CC  Final   Culture  Setup Time   Final    09/22/2014 03:55 Performed at [facility]    Culture   Final           BLOOD CULTURE RECEIVED NO GROWTH TO DATE CULTURE WILL BE HELD FOR 5 DAYS BEFORE ISSUING A FINAL NEGATIVE REPORT Performed at [facility]    Report Status PENDING  Incomplete  Blood culture (routine x 2)     Status: None (Preliminary result)   Collection Time: 09/21/14  5:50 PM  Result Value Ref Range Status   Specimen Description BLOOD LEFT HAND  Final  Special Requests BOTTLES DRAWN AEROBIC  ONLY 3CC  Final   Culture  Setup Time   Final    09/22/2014 03:55 Performed at [facility]    Culture   Final           BLOOD CULTURE RECEIVED NO GROWTH TO DATE CULTURE WILL BE HELD FOR 5 DAYS BEFORE ISSUING A FINAL NEGATIVE REPORT Performed at [facility]    Report Status PENDING  Incomplete     Labs: Basic Metabolic Panel:  Recent Labs Lab 09/21/14 1408 09/23/14 0444 09/25/14 1202  NA 144 142 138  K 4.2 3.5* 4.4  CL 106 103 98  CO2 23 24 25   GLUCOSE 112* 74 107*  BUN 13 12 17   CREATININE 0.80 0.90 0.98  CALCIUM 9.2 8.6 9.1   Liver Function Tests: No results for input(s): AST, ALT, ALKPHOS, BILITOT, PROT, ALBUMIN in the last 168 hours. No results for input(s): LIPASE, AMYLASE in the last 168 hours. No results for input(s): AMMONIA in the last 168 hours. CBC:  Recent Labs Lab 09/23/14 0444  WBC 5.6  HGB 12.1  HCT 37.4  MCV 91.4  PLT 158   Cardiac Enzymes: No results for input(s): CKTOTAL, CKMB, CKMBINDEX, TROPONINI in the last 168 hours. BNP: BNP (last 3 results)  Recent Labs  09/21/14 1402  PROBNP 6652.0*   CBG:  Recent Labs Lab 09/23/14 1308  GLUCAP 82       Signed:  FELIZ ORTIZ, ABRAHAM  [facility] 09/26/2014, 2:24 PM

## 2014-09-26 NOTE — Telephone Encounter (Signed)
Called pt concerning TCM appt no answer LMOM RTC.../lmb 

## 2014-09-27 ENCOUNTER — Ambulatory Visit: Payer: Medicare Other | Admitting: Internal Medicine

## 2014-09-27 DIAGNOSIS — I10 Essential (primary) hypertension: Secondary | ICD-10-CM | POA: Diagnosis not present

## 2014-09-27 DIAGNOSIS — E785 Hyperlipidemia, unspecified: Secondary | ICD-10-CM | POA: Diagnosis not present

## 2014-09-27 DIAGNOSIS — J189 Pneumonia, unspecified organism: Secondary | ICD-10-CM | POA: Diagnosis not present

## 2014-09-27 DIAGNOSIS — F329 Major depressive disorder, single episode, unspecified: Secondary | ICD-10-CM | POA: Diagnosis not present

## 2014-09-27 DIAGNOSIS — R569 Unspecified convulsions: Secondary | ICD-10-CM | POA: Diagnosis not present

## 2014-09-27 DIAGNOSIS — I5033 Acute on chronic diastolic (congestive) heart failure: Secondary | ICD-10-CM | POA: Diagnosis not present

## 2014-09-27 DIAGNOSIS — S72001S Fracture of unspecified part of neck of right femur, sequela: Secondary | ICD-10-CM | POA: Diagnosis not present

## 2014-09-27 DIAGNOSIS — S52501S Unspecified fracture of the lower end of right radius, sequela: Secondary | ICD-10-CM | POA: Diagnosis not present

## 2014-09-27 NOTE — Telephone Encounter (Signed)
Daughter [name] call back concerning TCM completed call below.../lmb  Transition Care Management Follow-up Telephone Call D/C 09/26/14  How have you been since you were released from the hospital? Daughter states mom is the same. She is not capable to during anything for herself   Do you understand why you were in the hospital? YES, daughter states they did  Understand why she was admitted but the hospital wasn't upfront/honest with them at discharge. They was told that she was mobile but she can't walk   Do you understand the discharge instrcutions? YES, daughter understood d/c summary we reviewed  Items Reviewed:  Medications reviewed: YES, reviewed  Allergies reviewed: YES, reviewed  Dietary changes reviewed: YES, heart healthy, but daughter states she is not eating much. They bought some boost to give her she did eat a piece of toast this am.   Referrals reviewed: YES, home heath and she stated advance is coming out today for assesment   Functional Questionnaire:   Activities of Daily Living (ADLs):   Daughter stated she dependent in the following: ambulation, bathing and hygiene, feeding, continence, toileting and dressing, walking Daughter states mom  require assistance with everything she can't do anything for herself   Any transportation issues/concerns?: YES   Any patient concerns? NO   Confirmed importance and date/time of follow-up visits scheduled: YES, daughter stated the hospital had made a hops f/u for today, but she had to reschedule because there was no way mom could make the appt today. She had reschedule appt for 10/17/14. Inform daughter to keep appt, and which she stated they will. By then maybe mom is able to walk   Confirmed with patient if condition begins to worsen call PCP or go to the ER.  Patient was given the Call-a-Nurse line [phone]: YES

## 2014-09-28 LAB — CULTURE, BLOOD (ROUTINE X 2)
CULTURE: NO GROWTH
Culture: NO GROWTH

## 2014-09-30 ENCOUNTER — Telehealth: Payer: Self-pay | Admitting: Internal Medicine

## 2014-09-30 DIAGNOSIS — S72001S Fracture of unspecified part of neck of right femur, sequela: Secondary | ICD-10-CM | POA: Diagnosis not present

## 2014-09-30 DIAGNOSIS — R569 Unspecified convulsions: Secondary | ICD-10-CM | POA: Diagnosis not present

## 2014-09-30 DIAGNOSIS — I10 Essential (primary) hypertension: Secondary | ICD-10-CM | POA: Diagnosis not present

## 2014-09-30 DIAGNOSIS — S52501S Unspecified fracture of the lower end of right radius, sequela: Secondary | ICD-10-CM | POA: Diagnosis not present

## 2014-09-30 DIAGNOSIS — J189 Pneumonia, unspecified organism: Secondary | ICD-10-CM | POA: Diagnosis not present

## 2014-09-30 DIAGNOSIS — I5033 Acute on chronic diastolic (congestive) heart failure: Secondary | ICD-10-CM | POA: Diagnosis not present

## 2014-09-30 MED ORDER — ZOLPIDEM TARTRATE 5 MG PO TABS: ORAL_TABLET | ORAL | Status: DC

## 2014-09-30 NOTE — Telephone Encounter (Signed)
Pt scheduled to see you 12/31. Pt will be out of meds 5 days prior to seeing you. The medication is Ambien 5 mg tablet. I am not sure that you have prescribed this or not. I informed daughter that you may need to see her if you have never prescribed this before. I offered to move up appt but daughter wanted to keep appt 12/31. Pt was in hospital and feeling weak, wanted to hold off untuil 12/31 appt if possible.  [phone] Or [phone] [name] - daughter

## 2014-09-30 NOTE — Telephone Encounter (Signed)
Done hardcopy to robin  

## 2014-10-01 ENCOUNTER — Telehealth: Payer: Self-pay | Admitting: Internal Medicine

## 2014-10-01 ENCOUNTER — Other Ambulatory Visit: Payer: Self-pay

## 2014-10-01 ENCOUNTER — Ambulatory Visit: Payer: Medicare Other | Admitting: Internal Medicine

## 2014-10-01 DIAGNOSIS — I5033 Acute on chronic diastolic (congestive) heart failure: Secondary | ICD-10-CM | POA: Diagnosis not present

## 2014-10-01 DIAGNOSIS — J189 Pneumonia, unspecified organism: Secondary | ICD-10-CM | POA: Diagnosis not present

## 2014-10-01 DIAGNOSIS — S52501S Unspecified fracture of the lower end of right radius, sequela: Secondary | ICD-10-CM | POA: Diagnosis not present

## 2014-10-01 DIAGNOSIS — I10 Essential (primary) hypertension: Secondary | ICD-10-CM | POA: Diagnosis not present

## 2014-10-01 DIAGNOSIS — R569 Unspecified convulsions: Secondary | ICD-10-CM | POA: Diagnosis not present

## 2014-10-01 DIAGNOSIS — R4689 Other symptoms and signs involving appearance and behavior: Secondary | ICD-10-CM | POA: Insufficient documentation

## 2014-10-01 DIAGNOSIS — S72001S Fracture of unspecified part of neck of right femur, sequela: Secondary | ICD-10-CM | POA: Diagnosis not present

## 2014-10-01 NOTE — Telephone Encounter (Signed)
Spoke to the daughter and she did agree to have pt here for appt. With PCP on 10/03/14 at 11 AM for a 30 min. appt.

## 2014-10-01 NOTE — Telephone Encounter (Signed)
Faxed hardcopy for Zolpidem to [employer].

## 2014-10-01 NOTE — Telephone Encounter (Signed)
Called [facility] informed of PCP instructions and she did verbally understand and will contact the family to have schedule appt. asap.

## 2014-10-01 NOTE — Telephone Encounter (Signed)
BP sitting 92/58 after stairs and standing BP with dizziness 76/56, 3 min later in sitting 88/60 and then 104/64. Nurse said to push fluids and nurse asking about BP med, does BP med need to change.   Kelly/Physical Therapist-[phone]

## 2014-10-01 NOTE — Telephone Encounter (Signed)
Pt missed her 10am appt with Dr Linna Darner today  I dont see where pt has an appt on record with cardiology at 1 wk after recent d/c from hosp dec 10  Most recent difficulty with orthostasis was felt due to over-diuresis during her hospn; pt is no longer on diuretic per med list  Pt is on lowest dose BB and cardizem, felt needed for aflutter control (not BP); pt also on new Eliquis  Ideally pt should be seen in office, either with appt with PCP or Dr Linna Darner to r/o dehydration, overmedication, infection or cardiac issue or other leading to orthostasis.    This should be done urgently if pt has poor po intake, or overt blood loss , and would require return to ER especially if symptomatic (dizzy) with the blood pressure falling

## 2014-10-03 ENCOUNTER — Encounter: Payer: Self-pay | Admitting: Internal Medicine

## 2014-10-03 ENCOUNTER — Ambulatory Visit (INDEPENDENT_AMBULATORY_CARE_PROVIDER_SITE_OTHER): Payer: Medicare Other | Admitting: Internal Medicine

## 2014-10-03 VITALS — BP 112/62 | HR 95 | Temp 98.1°F | Ht 66.0 in | Wt 115.0 lb

## 2014-10-03 DIAGNOSIS — I5032 Chronic diastolic (congestive) heart failure: Secondary | ICD-10-CM | POA: Diagnosis not present

## 2014-10-03 DIAGNOSIS — I5033 Acute on chronic diastolic (congestive) heart failure: Secondary | ICD-10-CM

## 2014-10-03 DIAGNOSIS — I639 Cerebral infarction, unspecified: Secondary | ICD-10-CM

## 2014-10-03 DIAGNOSIS — Z609 Problem related to social environment, unspecified: Secondary | ICD-10-CM

## 2014-10-03 DIAGNOSIS — F419 Anxiety disorder, unspecified: Secondary | ICD-10-CM

## 2014-10-03 DIAGNOSIS — I951 Orthostatic hypotension: Secondary | ICD-10-CM | POA: Diagnosis not present

## 2014-10-03 DIAGNOSIS — Z659 Problem related to unspecified psychosocial circumstances: Secondary | ICD-10-CM

## 2014-10-03 DIAGNOSIS — I471 Supraventricular tachycardia: Secondary | ICD-10-CM

## 2014-10-03 MED ORDER — ALPRAZOLAM 0.25 MG PO TABS: ORAL_TABLET | ORAL | Status: DC

## 2014-10-03 NOTE — Patient Instructions (Addendum)
OK to stay off the tenormin for now  Please continue to try to drink more fluids  Please continue to monitor the Heart Rate over the next few days, with the goal being HR 60-80 most of hte time at rest  OK to change the xanax to 1 pill twice during the day as needed, then 1-2 at bedtime as well  Please continue all other medications as before  Please have the pharmacy call with any other refills you may need.  Please keep your appointments with your specialists as you may have planned - Dr Annett Fabian  We will ask you to see Stanton Kidney, our [facility], today in the office to help with cardiology referral follow up  Please return in 3 weeks, or sooner if needed

## 2014-10-03 NOTE — Progress Notes (Signed)
Subjective:    Patient ID: Kayla Kent, female    DOB: November 19, 1931, 80 y.o.   MRN: [id number]  HPI  Here with daughter, and spoke to son by phone in [place] during exam, family upset as pt was apparently overdiuresed with volume overaload dec 5, d/c dec 10 with orthostasis with encouragment to take more fluids po.  Pt has started PT at home and progressing, but noted by [facility] to be mild orthostatic (see phone note).  Family stopped her tenormin on their own.  Has aflutter - HR known to be variable but well controlled at d/c.  HR today increased to 95 after exertion with walking in with walker from parking lot (did not have to stop or sit) to waiting area  - approx 50 ft.  HR during my exam at rest approx 70.  Still mild orthostatic on challenge today, but loweest SBP at 112.  Wt is up significantly in the past wk. Pt with fatigue but without signficant symtpoms o/w today.  Pt denies chest pain, increased sob or doe, wheezing, orthopnea, PND, increased LE swelling, palpitations,  or syncope.  Has been trying to take more po.  Does not have an actual appt with card/Dr Harrington Challenger though advised she was needed at one wk post d/c.  Family upset no call about this. Family also upset that [place] (which only works partially anyway) will no longer be covered by her insurance. Past Medical History  Diagnosis Date  . Anxiety   . Paroxysmal supraventricular tachycardia   . Uterine prolapse without mention of vaginal wall prolapse   . HTN (hypertension)   . HLD (hyperlipidemia)   . Intolerance of drug     orthostatic  . PVD (peripheral vascular disease)   . Allergic rhinitis   . Syncope and collapse    Past Surgical History  Procedure Laterality Date  . Corrective eye surgery      as a child  . Cholecystectomy    . Tonsillectomy    . Ivd removed    . Stress cardiolite  08/05/93  . Rf ablation psvt      summer '10  . Total hip arthroplasty Right 08/29/2014    Procedure: Right Hip Hemi Arthroplasty;   Surgeon: Marianna Payment, MD;  Location: [place];  Service: Orthopedics;  Laterality: Right;  Hip procedure 1st wants Peg Board, [employer], [place]. Wrist - wants [employer].  Surgeon available at 1645.  Surgeon to inform sales reps.  Marland Kitchen Open reduction internal fixation (orif) distal radial fracture Right 08/29/2014    Procedure: OPEN REDUCTION INTERNAL FIXATION (ORIF) DISTAL RADIAL FRACTURE;  Surgeon: Marianna Payment, MD;  Location: [place];  Service: Orthopedics;  Laterality: Right;    reports that she quit smoking about 58 years ago. She has never used smokeless tobacco. She reports that she does not drink alcohol or use illicit drugs. family history includes Arthritis in her father; Coronary artery disease in her brother; Heart disease in her brother; Hyperlipidemia in her sister; Hypertension in her brother, sister, and another family member; Mental illness in her father. There is no history of Colon cancer, Breast cancer, or Diabetes. Allergies  Allergen Reactions  . Chocolate   . Codeine   . Diazepam   . Fruit & Vegetable Daily [Nutritional Supplements] Hives and Swelling    peaches  . Iodine   . Iohexol      Code: HIVES, Desc: pt gets 13 hr pre-meds, Onset Date: 63016010   .  Latex Hives  . Peach [Prunus Persica]   . Peanut-Containing Drug Products Hives and Swelling  . Penicillins Hives, Itching and Swelling  . Strawberry   . Sulfonamide Derivatives   . Wheat     Shortness of breath   Current Outpatient Prescriptions on File Prior to Visit  Medication Sig Dispense Refill  . acetaminophen (TYLENOL) 500 MG tablet Take 1 tablet (500 mg total) by mouth every 6 (six) hours as needed. 30 tablet 0  . ALPRAZolam (XANAX) 0.25 MG tablet Take 1 tablet (0.25 mg total) by mouth 3 (three) times daily as needed for anxiety. 90 tablet 5  . apixaban (ELIQUIS) 2.5 MG TABS tablet Take 1 tablet (2.5 mg total) by mouth 2 (two) times daily. 60 tablet 0  . levETIRAcetam (KEPPRA) 250 MG tablet  Take 250 mg by mouth 2 (two) times daily.    . rosuvastatin (CRESTOR) 5 MG tablet Take 5 mg by mouth at bedtime.    Marland Kitchen zolpidem (AMBIEN) 5 MG tablet Take one tablet by mouth every night at bedtime as needed for insomnia 30 tablet 2  . atenolol (TENORMIN) 25 MG tablet Take 1 tablet (25 mg total) by mouth daily. (Patient not taking: Reported on 10/03/2014) 30 tablet 0  . diltiazem (DILACOR XR) 180 MG 24 hr capsule Take 180 mg by mouth daily.     No current facility-administered medications on file prior to visit.   Wt Readings from Last 3 Encounters:  10/03/14 115 lb (52.164 kg)  09/26/14 107 lb 9.6 oz (48.807 kg)  09/06/14 133 lb 3.2 oz (60.419 kg)    Review of Systems  Constitutional: Negative for unusual diaphoresis or other sweats  HENT: Negative for ringing in ear Eyes: Negative for double vision or worsening visual disturbance.  Respiratory: Negative for choking and stridor.   Gastrointestinal: Negative for vomiting or other signifcant bowel change Genitourinary: Negative for hematuria or decreased urine volume.  Musculoskeletal: Negative for other MSK pain or swelling Skin: Negative for color change and worsening wound.  Neurological: Negative for tremors and numbness other than noted  Psychiatric/Behavioral: Negative for decreased concentration or agitation other than above       Objective:   Physical Exam BP 112/62 mmHg  Pulse 95  Temp(Src) 98.1 F (36.7 C) (Oral)  Ht 5\' 6"  (1.676 m)  Wt 115 lb (52.164 kg)  BMI 18.57 kg/m2  SpO2 97% VS noted, including orthostatics done today Constitutional: Pt appears well-developed, well-nourished.  HENT: Head: NCAT.  Right Ear: External ear normal.  Left Ear: External ear normal.  Eyes: . Pupils are equal, round, and reactive to light. Conjunctivae and EOM are normal Neck: Normal range of motion. Neck supple.  Cardiovascular: Normal rate and irregular rhythm.   Pulmonary/Chest: Effort normal and breath sounds without rales or  wheezing.  Neurological: Pt is alert. Not confused , motor grossly intact Skin: Skin is warm. No rash Psychiatric: Pt behavior is normal. No agitation.      Assessment & Plan:

## 2014-10-03 NOTE — Progress Notes (Signed)
Pre visit review using our clinic review tool, if applicable. No additional management support is needed unless otherwise documented below in the visit note. 

## 2014-10-04 ENCOUNTER — Encounter: Payer: Self-pay | Admitting: Internal Medicine

## 2014-10-04 ENCOUNTER — Ambulatory Visit (INDEPENDENT_AMBULATORY_CARE_PROVIDER_SITE_OTHER): Payer: Medicare Other | Admitting: Internal Medicine

## 2014-10-04 VITALS — BP 140/70 | HR 77 | Ht 66.0 in | Wt 115.0 lb

## 2014-10-04 DIAGNOSIS — I951 Orthostatic hypotension: Secondary | ICD-10-CM

## 2014-10-04 DIAGNOSIS — I10 Essential (primary) hypertension: Secondary | ICD-10-CM | POA: Diagnosis not present

## 2014-10-04 DIAGNOSIS — I639 Cerebral infarction, unspecified: Secondary | ICD-10-CM

## 2014-10-04 DIAGNOSIS — I5033 Acute on chronic diastolic (congestive) heart failure: Secondary | ICD-10-CM | POA: Diagnosis not present

## 2014-10-04 DIAGNOSIS — R569 Unspecified convulsions: Secondary | ICD-10-CM | POA: Diagnosis not present

## 2014-10-04 DIAGNOSIS — I4891 Unspecified atrial fibrillation: Secondary | ICD-10-CM

## 2014-10-04 DIAGNOSIS — S52501S Unspecified fracture of the lower end of right radius, sequela: Secondary | ICD-10-CM | POA: Diagnosis not present

## 2014-10-04 DIAGNOSIS — J189 Pneumonia, unspecified organism: Secondary | ICD-10-CM | POA: Diagnosis not present

## 2014-10-04 DIAGNOSIS — S72001S Fracture of unspecified part of neck of right femur, sequela: Secondary | ICD-10-CM | POA: Diagnosis not present

## 2014-10-04 LAB — BASIC METABOLIC PANEL
BUN: 19 mg/dL (ref 6–23)
CHLORIDE: 106 meq/L (ref 96–112)
CO2: 27 mEq/L (ref 19–32)
CREATININE: 1 mg/dL (ref 0.4–1.2)
Calcium: 9.2 mg/dL (ref 8.4–10.5)
GFR: 56.61 mL/min — AB (ref >60.00)
Glucose, Bld: 89 mg/dL (ref 70–99)
POTASSIUM: 4.3 meq/L (ref 3.5–5.1)
Sodium: 141 mEq/L (ref 135–145)

## 2014-10-04 LAB — CBC
HEMATOCRIT: 41.8 % (ref 36.0–46.0)
HEMOGLOBIN: 13.3 g/dL (ref 12.0–15.0)
MCHC: 31.8 g/dL (ref 30.0–36.0)
MCV: 89.6 fl (ref 78.0–100.0)
Platelets: 364 10*3/uL (ref 150.0–400.0)
RBC: 4.66 Mil/uL (ref 3.87–5.11)
RDW: 15.6 % — ABNORMAL HIGH (ref 11.5–15.5)
WBC: 9.2 10*3/uL (ref 4.0–10.5)

## 2014-10-04 MED ORDER — MIDODRINE HCL 2.5 MG PO TABS: 2.5000 mg | ORAL_TABLET | Freq: Three times a day (TID) | ORAL | Status: DC

## 2014-10-04 MED ORDER — MIDODRINE HCL 2.5 MG PO TABS: 2.5000 mg | ORAL_TABLET | Freq: Two times a day (BID) | ORAL | Status: DC

## 2014-10-04 NOTE — Patient Instructions (Signed)
Your physician has recommended you make the following change in your medication:  1.) START MIDODRINE 2.5 MG TWO TIMES A DAY (8A ,2P)  Your physician recommends that you return for lab work TODAY (bmet, cbc, bnp)  Your physician recommends that you schedule a follow-up appointment in: next week with physician extender.

## 2014-10-04 NOTE — Progress Notes (Signed)
HPI Patient is an 80 yo who I saw back in 2012  She has a history of AVNRT s/p ablation in 2010  Alsot history of HTN, HL, PVOD,  She was admitted with fall and hip fx in Novmeber She was hospitalized in Dec with SOB  Found to have LLL pneumonia During hsop stay had transient weakness L hand  Resolved  No TPA givne  Echo showed LVEF normal  Gr III diastolic dysfunction.  Cardiology contatced for afib.  She did have some volume overload  Bp was a little low and she was dizzy   Today, she was seen by Dr. Olevia Bowens and was noted to have atrial flutter with variable block. She had some bradycardia and hypotension felt secondary to atenolol. Cardiology was asked to evaluate her. Since d/c she has been weak  Dizzy at times  Seen in PT  BP in 90s  Went down to 70s  B Blocker was held.  SHe is felling a little better  Still weak.  No syncope  No falls  Breathing is OK    Allergies  Allergen Reactions  . Chocolate   . Codeine   . Diazepam   . Fruit & Vegetable Daily [Nutritional Supplements] Hives and Swelling    peaches  . Iodine   . Iohexol      Code: HIVES, Desc: pt gets 13 hr pre-meds, Onset Date: 06301601   . Latex Hives  . Peach [Prunus Persica]   . Peanut-Containing Drug Products Hives and Swelling  . Penicillins Hives, Itching and Swelling  . Strawberry   . Sulfonamide Derivatives   . Wheat     Shortness of breath    Current Outpatient Prescriptions  Medication Sig Dispense Refill  . acetaminophen (TYLENOL) 500 MG tablet Take 1 tablet (500 mg total) by mouth every 6 (six) hours as needed. 30 tablet 0  . ALPRAZolam (XANAX) 0.25 MG tablet 1 tab by mouth twice per day and 1-2 tabs at bedtime if needed for sleep 90 tablet 5  . apixaban (ELIQUIS) 2.5 MG TABS tablet Take 1 tablet (2.5 mg total) by mouth 2 (two) times daily. 60 tablet 0  . diltiazem (DILACOR XR) 180 MG 24 hr capsule Take 180 mg by mouth daily.    Marland Kitchen levETIRAcetam (KEPPRA) 250 MG tablet Take 250 mg by mouth 2 (two) times  daily.    . rosuvastatin (CRESTOR) 5 MG tablet Take 5 mg by mouth at bedtime.    Marland Kitchen atenolol (TENORMIN) 25 MG tablet Take 1 tablet (25 mg total) by mouth daily. (Patient not taking: Reported on 10/04/2014) 30 tablet 0  . midodrine (PROAMATINE) 2.5 MG tablet Take 1 tablet (2.5 mg total) by mouth 2 (two) times daily with a meal. 60 tablet 11   No current facility-administered medications for this visit.    Past Medical History  Diagnosis Date  . Anxiety   . Paroxysmal supraventricular tachycardia   . Uterine prolapse without mention of vaginal wall prolapse   . HTN (hypertension)   . HLD (hyperlipidemia)   . Intolerance of drug     orthostatic  . PVD (peripheral vascular disease)   . Allergic rhinitis   . Syncope and collapse     Past Surgical History  Procedure Laterality Date  . Corrective eye surgery      as a child  . Cholecystectomy    . Tonsillectomy    . Ivd removed    . Stress cardiolite  08/05/93  . Rf ablation psvt  summer '10  . Total hip arthroplasty Right 08/29/2014    Procedure: Right Hip Hemi Arthroplasty;  Surgeon: Marianna Payment, MD;  Location: [place];  Service: Orthopedics;  Laterality: Right;  Hip procedure 1st wants Peg Board, [employer], [place]. Wrist - wants [employer].  Surgeon available at 1645.  Surgeon to inform sales reps.  Marland Kitchen Open reduction internal fixation (orif) distal radial fracture Right 08/29/2014    Procedure: OPEN REDUCTION INTERNAL FIXATION (ORIF) DISTAL RADIAL FRACTURE;  Surgeon: Marianna Payment, MD;  Location: [place];  Service: Orthopedics;  Laterality: Right;    Family History  Problem Relation Age of Onset  . Coronary artery disease Brother   . Hypertension      family hx  . Colon cancer Neg Hx   . Breast cancer Neg Hx   . Diabetes Neg Hx   . Mental illness Father     suicide  . Arthritis Father   . Hyperlipidemia Sister   . Hypertension Sister   . Heart disease Brother     CAD/MI  . Hypertension Brother      History   Social History  . Marital Status: Widowed    Spouse Name: N/A    Number of Children: 2  . Years of Education: 18   Occupational History  . retired Radio producer    Social History Main Topics  . Smoking status: Former Smoker    Quit date: 12/08/1955  . Smokeless tobacco: Never Used     Comment: quit in 1995   . Alcohol Use: No  . Drug Use: No  . Sexual Activity: No   Other Topics Concern  . Not on file   Social History Narrative   [employer], [employer]-BA, [employer]-early childhood. Married '55 -61 years.  1 son - 99; 1 daughter - 76; 3 grandchildren . Lives alone. ACP - discussed and provided packet on end of life care (Feb '13)   Patient is now widowed.   Patient is right-handed.   Patient drinks tea daily.    Review of Systems:  All systems reviewed.  They are negative to the above problem except as previously stated.  Vital Signs: BP 140/70 mmHg  Pulse 77  Ht 5\' 6"  (1.676 m)  Wt 115 lb (52.164 kg)  BMI 18.57 kg/m2  Physical Exam  HEENT:  Normocephalic, atraumatic. EOMI, PERRLA.  Neck: JVP is normal.  No bruits.  Lungs: clear to auscultation. No rales no wheezes.  Heart: Irregular rate and rhythm. Normal S1, S2. No S3.   No significant murmurs. PMI not displaced.  Abdomen:  Supple, nontender. Normal bowel sounds. No masses. No hepatomegaly.  Extremities:   Good distal pulses throughout. No lower extremity edema.  Musculoskeletal :moving all extremities.  Neuro:   alert and oriented x3.  CN II-XII grossly intact.  EKG  Afib  68  bpm  Anteior MI   Assessment and Plan:  1.  Afib  I would keep on current regimen  Hold b blocker  2.  Orthostatic hypotension.  Will check labs today  She cannot give a urine specimen Would add low dose midodrine to regimen to boost bp  Do not want her to fall Encouraged fluid and some salt intake.    Will try to get an appt next week  Daughter frustrated by lack of coordination at discharge

## 2014-10-06 DIAGNOSIS — Z659 Problem related to unspecified psychosocial circumstances: Secondary | ICD-10-CM | POA: Insufficient documentation

## 2014-10-06 NOTE — Assessment & Plan Note (Signed)
Though not directly addressed, I felt very uncomfortable with the daughter here (and son by phone) demanding-like and critical attitude towards all aspects of mother's care

## 2014-10-06 NOTE — Assessment & Plan Note (Signed)
Ok rate control, would favor continuing meds as is for now on diltiazem alone as daughter has already stopped the tenormin;  To cont to monitor HR as they do for the next several days, will need referral to cardiology as is not clear they have an appt and they have not been contacted; cautioned family in taking her care in their own hands (son in [place] is a icu type nurse)  Note:  Total time for pt hx, exam, review of record with pt in the room, determination of diagnoses and plan for further eval and tx is > 40 min, with over 50% spent in coordination and counseling of patient

## 2014-10-06 NOTE — Assessment & Plan Note (Signed)
Encourage po intake over next few days, BP less orthostatic today with SBP not < 90 on standing, though did drop and somewhat dizzy

## 2014-10-06 NOTE — Assessment & Plan Note (Signed)
O/w stable volume, prob still on dry side, is not taking diuretic at this time, cont same tx

## 2014-10-06 NOTE — Assessment & Plan Note (Signed)
Ongoing with insomnia, for xanax bid then 2 qhs prn as well,  to f/u any worsening symptoms or concerns

## 2014-10-07 ENCOUNTER — Telehealth: Payer: Self-pay | Admitting: Internal Medicine

## 2014-10-07 DIAGNOSIS — R569 Unspecified convulsions: Secondary | ICD-10-CM | POA: Diagnosis not present

## 2014-10-07 DIAGNOSIS — I5033 Acute on chronic diastolic (congestive) heart failure: Secondary | ICD-10-CM | POA: Diagnosis not present

## 2014-10-07 DIAGNOSIS — I10 Essential (primary) hypertension: Secondary | ICD-10-CM | POA: Diagnosis not present

## 2014-10-07 DIAGNOSIS — S72001S Fracture of unspecified part of neck of right femur, sequela: Secondary | ICD-10-CM | POA: Diagnosis not present

## 2014-10-07 DIAGNOSIS — Z9181 History of falling: Secondary | ICD-10-CM | POA: Diagnosis not present

## 2014-10-07 DIAGNOSIS — J189 Pneumonia, unspecified organism: Secondary | ICD-10-CM | POA: Diagnosis not present

## 2014-10-07 DIAGNOSIS — S52501S Unspecified fracture of the lower end of right radius, sequela: Secondary | ICD-10-CM | POA: Diagnosis not present

## 2014-10-07 DIAGNOSIS — Z96641 Presence of right artificial hip joint: Secondary | ICD-10-CM | POA: Diagnosis not present

## 2014-10-07 DIAGNOSIS — Z471 Aftercare following joint replacement surgery: Secondary | ICD-10-CM | POA: Diagnosis not present

## 2014-10-07 MED ORDER — MIDODRINE HCL 2.5 MG PO TABS: 2.5000 mg | ORAL_TABLET | Freq: Two times a day (BID) | ORAL | Status: DC

## 2014-10-07 MED ORDER — APIXABAN 2.5 MG PO TABS: 2.5000 mg | ORAL_TABLET | Freq: Two times a day (BID) | ORAL | Status: DC

## 2014-10-07 NOTE — Telephone Encounter (Signed)
Clarified with patient's daughter that Midodrine is twice daily. Not three times. Also patient needed refill of Eliquis sent to [address].

## 2014-10-07 NOTE — Telephone Encounter (Signed)
New Msg    Pt daughter Kayla Kent calling, pt was prescribed midodrine hcl 2.5 mg to take twice a day, but pharmacy instructed 3x's a day with meals.    Pt daughter needs to know correct dosage. Please call daughter [phone] and msgs may be left.

## 2014-10-08 ENCOUNTER — Other Ambulatory Visit: Payer: Self-pay | Admitting: Internal Medicine

## 2014-10-08 DIAGNOSIS — S52501S Unspecified fracture of the lower end of right radius, sequela: Secondary | ICD-10-CM | POA: Diagnosis not present

## 2014-10-08 DIAGNOSIS — R569 Unspecified convulsions: Secondary | ICD-10-CM | POA: Diagnosis not present

## 2014-10-08 DIAGNOSIS — S72001D Fracture of unspecified part of neck of right femur, subsequent encounter for closed fracture with routine healing: Secondary | ICD-10-CM | POA: Diagnosis not present

## 2014-10-08 DIAGNOSIS — S72001S Fracture of unspecified part of neck of right femur, sequela: Secondary | ICD-10-CM | POA: Diagnosis not present

## 2014-10-08 DIAGNOSIS — I10 Essential (primary) hypertension: Secondary | ICD-10-CM | POA: Diagnosis not present

## 2014-10-08 DIAGNOSIS — J189 Pneumonia, unspecified organism: Secondary | ICD-10-CM | POA: Diagnosis not present

## 2014-10-08 DIAGNOSIS — I5033 Acute on chronic diastolic (congestive) heart failure: Secondary | ICD-10-CM | POA: Diagnosis not present

## 2014-10-08 LAB — BRAIN NATRIURETIC PEPTIDE: BRAIN NATRIURETIC PEPTIDE: 221.8 pg/mL — AB (ref 0.0–100.0)

## 2014-10-09 DIAGNOSIS — M25451 Effusion, right hip: Secondary | ICD-10-CM | POA: Diagnosis not present

## 2014-10-09 DIAGNOSIS — M25639 Stiffness of unspecified wrist, not elsewhere classified: Secondary | ICD-10-CM | POA: Diagnosis not present

## 2014-10-09 DIAGNOSIS — M25431 Effusion, right wrist: Secondary | ICD-10-CM | POA: Diagnosis not present

## 2014-10-09 DIAGNOSIS — M25651 Stiffness of right hip, not elsewhere classified: Secondary | ICD-10-CM | POA: Diagnosis not present

## 2014-10-10 ENCOUNTER — Encounter: Payer: Self-pay | Admitting: Physician Assistant

## 2014-10-10 ENCOUNTER — Ambulatory Visit (INDEPENDENT_AMBULATORY_CARE_PROVIDER_SITE_OTHER): Payer: Medicare Other | Admitting: Physician Assistant

## 2014-10-10 VITALS — BP 138/55 | HR 78 | Ht 65.0 in | Wt 117.0 lb

## 2014-10-10 DIAGNOSIS — I951 Orthostatic hypotension: Secondary | ICD-10-CM | POA: Diagnosis not present

## 2014-10-10 DIAGNOSIS — I639 Cerebral infarction, unspecified: Secondary | ICD-10-CM | POA: Diagnosis not present

## 2014-10-10 DIAGNOSIS — I5032 Chronic diastolic (congestive) heart failure: Secondary | ICD-10-CM

## 2014-10-10 DIAGNOSIS — I10 Essential (primary) hypertension: Secondary | ICD-10-CM | POA: Diagnosis not present

## 2014-10-10 DIAGNOSIS — I4819 Other persistent atrial fibrillation: Secondary | ICD-10-CM

## 2014-10-10 DIAGNOSIS — R42 Dizziness and giddiness: Secondary | ICD-10-CM

## 2014-10-10 DIAGNOSIS — E785 Hyperlipidemia, unspecified: Secondary | ICD-10-CM

## 2014-10-10 DIAGNOSIS — I481 Persistent atrial fibrillation: Secondary | ICD-10-CM

## 2014-10-10 NOTE — Progress Notes (Signed)
Cardiology Office Note   Date:  10/10/2014   ID:  Kayla Kent, DOB [birth year], MRN [id number]  PCP:  Cathlean Cower, MD  Cardiologist:  Dr. Dorris Carnes     History of Present Illness: Kayla Kent is a 80 y.o. 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 Patient suffered a fall fracture in 08/2014. She is status post ORIF. She was recent seen in the hospital 09/2014 for left lower lobe pneumonia. She had TIA symptoms with rapid improvement. Echocardiogram demonstrated normal LV function. She was noted to have volume excess and was diuresed. This was, complicated by orthostatic hypotension. She developed atrial flutter and was seen by cardiology.  CHADS2-VASc=8.  She was started on anticoagulation with Apixaban. After discharge she developed more symptoms of orthostatic hypotension. Beta blocker was held. She saw Dr. Harrington Challenger 10/04/14. She was still in atrial fibrillation with controlled rate. Low-dose Midodrine was added for her blood pressure. She returns for follow-up.  She is here with her daughter. She is doing well.  The patient denies chest pain, shortness of breath, syncope, orthopnea, PND or significant pedal edema.  She denies any further dizziness since starting on Midodrine.     Studies:   - Echo (12/15):  EF 55-60%, normal wall motion, grade 3 diastolic dysfunction, trivial AI, trivial MR, severe LAE  - Carotid US (12/15):  1- 39 percent stenosis involving the right internal carotid artery and the left internal carotid artery.   Recent Labs: 09/21/2014: Pro B Natriuretic peptide (BNP) 6652.0* 09/24/2014: LDL (calc) 93 10/04/2014: BUN 19; Creatinine 1.0; Hemoglobin 13.3; Potassium 4.3; Sodium 141   Estimated Creatinine Clearance: 37.6 mL/min (by C-G formula based on Cr of 1).    Recent Radiology: N/a   Wt Readings from Last 3 Encounters:  10/10/14 117 lb (53.071 kg)  10/04/14 115 lb (52.164 kg)   10/03/14 115 lb (52.164 kg)     Past Medical History  Diagnosis Date  . Anxiety   . Paroxysmal supraventricular tachycardia   . Uterine prolapse without mention of vaginal wall prolapse   . HTN (hypertension)   . HLD (hyperlipidemia)   . Intolerance of drug     orthostatic  . PVD (peripheral vascular disease)   . Allergic rhinitis   . Syncope and collapse     Current Outpatient Prescriptions  Medication Sig Dispense Refill  . acetaminophen (TYLENOL) 500 MG tablet Take 1 tablet (500 mg total) by mouth every 6 (six) hours as needed. 30 tablet 0  . ALPRAZolam (XANAX) 0.25 MG tablet 1 tab by mouth twice per day and 1-2 tabs at bedtime if needed for sleep 90 tablet 5  . apixaban (ELIQUIS) 2.5 MG TABS tablet Take 1 tablet (2.5 mg total) by mouth 2 (two) times daily. 60 tablet 11  . diltiazem (DILACOR XR) 180 MG 24 hr capsule Take 180 mg by mouth daily.    Marland Kitchen levETIRAcetam (KEPPRA) 250 MG tablet Take 250 mg by mouth 2 (two) times daily.    . midodrine (PROAMATINE) 2.5 MG tablet Take 1 tablet (2.5 mg total) by mouth 2 (two) times daily with a meal. 60 tablet 10  . rosuvastatin (CRESTOR) 5 MG tablet Take 5 mg by mouth at bedtime.     No current facility-administered medications for this visit.     Allergies:   Chocolate; Codeine; Diazepam; Fruit & vegetable daily; Iodine; Iohexol; Latex; Peach; Peanut-containing drug products; Penicillins; Strawberry; Sulfonamide  derivatives; and Wheat   Social History:  The patient  reports that she quit smoking about 58 years ago. She has never used smokeless tobacco. She reports that she does not drink alcohol or use illicit drugs.   Family History:  The patient's family history includes Arthritis in her father; Coronary artery disease in her brother; Heart attack in her brother; Heart disease in her brother; Hyperlipidemia in her sister; Hypertension in her brother, sister, and another family member; Mental illness in her father. There is no history of  Colon cancer, Breast cancer, Diabetes, Stroke, Diabetes, or Diabetes.    ROS:  Please see the history of present illness.   No bleeding problems.   All other systems reviewed and negative.    PHYSICAL EXAM: VS:  BP 138/55 mmHg  Pulse 78  Ht 5\' 5"  (1.651 m)  Wt 117 lb (53.071 kg)  BMI 19.47 kg/m2   Orthostatic VS for the past 24 hrs:  BP- Lying Pulse- Lying BP- Sitting Pulse- Sitting BP- Standing at 0 minutes Pulse- Standing at 0 minutes  10/10/14 1044 138/55 mmHg 80 123/40 mmHg 67 98/49 mmHg 85    Well nourished, well developed, in no acute distress HEENT: normal Neck:  no JVD Cardiac:  normal S1, S2;  irreg irreg rhythm, no murmur   Lungs:   clear to auscultation bilaterally, no wheezing, rhonchi or rales Abd: soft, nontender, no hepatomegaly Ext:  no edema Skin: warm and dry Neuro:  CNs 2-12 intact, no focal abnormalities noted  EKG:  AFib, HR 78      ASSESSMENT AND PLAN:   1. Orthostatic Hypotension:  She is no longer symptomatic.  But, she is still having a significant BP drop from lying to standing.      -  Increase Midodrine to 5 mg in AM and 2.5 mg in PM.    -  Wear TED hose from morning to night.    -  She will need BMET at FU appt. 2. Persistent Atrial Fibrillation:  She is overall asymptomatic.  AFib occurred in context of acute illness.  We could certainly consider DCCV if she remains in AFib.  If no symptom improvement with this, rate control strategy could be continued.  RFs are high enough that she will need to remain on long term anticoagulation.      -  Continue Eliquis and Diltiazem.  3. Chronic Diastolic CHF:  Volume stable without diuretics.  4. Hypertension:  Controlled.  5. Hyperlipidemia:  Continue statin.   Disposition:   FU with Dr. Dorris Carnes or me in 2-3 weeks.    Signed, Versie Starks, MHS 10/10/2014 11:06 AM    [facility] [place], [place], [place]  [address] Phone: [phone]; Fax: [phone]

## 2014-10-10 NOTE — Patient Instructions (Addendum)
Your physician has recommended you make the following change in your medication:   START TAKING MIDODRINE 5 MG IN AM AND 2.5 MG IN PM    START WEARING A TED HOSE FROM MORNING TO NIGHT   Your physician recommends that you schedule a follow-up appointment in:  WITH DR ROSS IN 2 TO 3 WEEKS OR WITH SCOTT WEAVER SAME DAY ROSS IS IN OFFICE OR NEXT AVAILABLE

## 2014-10-14 DIAGNOSIS — M25431 Effusion, right wrist: Secondary | ICD-10-CM | POA: Diagnosis not present

## 2014-10-14 DIAGNOSIS — M25451 Effusion, right hip: Secondary | ICD-10-CM | POA: Diagnosis not present

## 2014-10-14 DIAGNOSIS — M25639 Stiffness of unspecified wrist, not elsewhere classified: Secondary | ICD-10-CM | POA: Diagnosis not present

## 2014-10-14 DIAGNOSIS — M25651 Stiffness of right hip, not elsewhere classified: Secondary | ICD-10-CM | POA: Diagnosis not present

## 2014-10-14 NOTE — Telephone Encounter (Signed)
Follow up    daughter calling   In the office on  12/24 recommend to be seen in  Two week .    Due to her  mother issues  of her heart / medication - what are the other options

## 2014-10-14 NOTE — Telephone Encounter (Signed)
Pt's daughter stressed and concerned because pt was seen 12/24 and Nicki Reaper said to see her again in two-three weeks. Next appt scheduled for 1/25.  She is concerned over Midodrine and A-fib and whether or not pt will need to have cardioversion. Explained to patient that Midodrine is to help with low blood pressure, so to continue checking her BP and if it increases, not to administer.  Explained to patient what A-fib is and how Eliquis is used to help treat it. Pt's daughter appreciative and st she feels relieved after explanation, but still wants to try to get in earlier if possible. Explained to her that as of now, no earlier appointments are available with Dr. Harrington Challenger or Richardson Dopp.  Forwarding to Dr. Harrington Challenger' nurse for review.

## 2014-10-16 ENCOUNTER — Telehealth: Payer: Self-pay | Admitting: Physician Assistant

## 2014-10-16 DIAGNOSIS — M25651 Stiffness of right hip, not elsewhere classified: Secondary | ICD-10-CM | POA: Diagnosis not present

## 2014-10-16 DIAGNOSIS — M25431 Effusion, right wrist: Secondary | ICD-10-CM | POA: Diagnosis not present

## 2014-10-16 DIAGNOSIS — M25451 Effusion, right hip: Secondary | ICD-10-CM | POA: Diagnosis not present

## 2014-10-16 DIAGNOSIS — M25639 Stiffness of unspecified wrist, not elsewhere classified: Secondary | ICD-10-CM | POA: Diagnosis not present

## 2014-10-16 NOTE — Telephone Encounter (Signed)
New Msg      Pt daughter calling to get lab results. Please return call.

## 2014-10-16 NOTE — Telephone Encounter (Signed)
s/w pt's daughter about lab results who is caretaker to pt. I asked how pt was feeling w/increased midodrine, daughter states fine. Pt's recent BP's. 12/30 146/80 HR 94, 12/29 159/105 HR 101, 12/28 160/75 HR 91, 12/24 144/74 HR 90, 12/23 119/76 HR 90. I asked if pt had been wearing the TED hose. Daughter stated they are getting them tomorrow due to pt had to be measured for them. I stated I will d/w Nicki Reaper W. PA BP readings and to see if he has any further recommendations.  Daughter also states that they are not scheduled until 11/11/14 to see Nicki Reaper when he had said 2-3 weeks from 12/24. I stated I am sure the reason why was so that Dr. Harrington Challenger would be in the office the same day. I did look for something sooner however; I could not find an appt with Nicki Reaper same day Dr. Harrington Challenger was in the office. I told her I will d/w Nicki Reaper about this as well. Daughter said thank you for my help today.

## 2014-10-16 NOTE — Telephone Encounter (Signed)
BPs ok If there is something available sooner (even if PRoss not here same day) please schedule her. Richardson Dopp, PA-C   10/16/2014 10:30 PM

## 2014-10-17 ENCOUNTER — Ambulatory Visit (INDEPENDENT_AMBULATORY_CARE_PROVIDER_SITE_OTHER): Payer: Medicare Other | Admitting: Internal Medicine

## 2014-10-17 ENCOUNTER — Encounter: Payer: Self-pay | Admitting: Internal Medicine

## 2014-10-17 VITALS — BP 136/70 | HR 55 | Temp 97.9°F | Ht 65.0 in | Wt 118.8 lb

## 2014-10-17 DIAGNOSIS — I10 Essential (primary) hypertension: Secondary | ICD-10-CM | POA: Diagnosis not present

## 2014-10-17 DIAGNOSIS — F329 Major depressive disorder, single episode, unspecified: Secondary | ICD-10-CM | POA: Diagnosis not present

## 2014-10-17 DIAGNOSIS — I639 Cerebral infarction, unspecified: Secondary | ICD-10-CM | POA: Diagnosis not present

## 2014-10-17 DIAGNOSIS — E785 Hyperlipidemia, unspecified: Secondary | ICD-10-CM | POA: Diagnosis not present

## 2014-10-17 DIAGNOSIS — F32A Depression, unspecified: Secondary | ICD-10-CM

## 2014-10-17 MED ORDER — MIDODRINE HCL 2.5 MG PO TABS: ORAL_TABLET | ORAL | Status: DC

## 2014-10-17 NOTE — Patient Instructions (Signed)
Please continue all other medications as before, and refills have been done if requested.  Please have the pharmacy call with any other refills you may need.  Please keep your appointments with your specialists as you may have planned  Please return in 6 months, or sooner if needed

## 2014-10-17 NOTE — Progress Notes (Signed)
Subjective:    Patient ID: Kayla Kent, female    DOB: [birth year], 80 y.o.   MRN: [id number]  HPI  Here to f/u; overall doing ok, accompanied by daughter,  Pt denies chest pain, increased sob or doe, wheezing, orthopnea, PND, increased LE swelling, palpitations, or syncope, with dizziness improved with midodrine as rx.  Pt denies polydipsia, polyuria, or low sugar symptoms such as weakness or confusion improved with po intake.  Pt denies new neurological symptoms such as new headache, or facial or extremity weakness or numbness.   Pt states overall good compliance with meds, has been trying to follow lower cholesterol diet, with wt up several lbs, Denies worsening depressive symptoms, suicidal ideation, or panic Wt Readings from Last 3 Encounters:  10/17/14 118 lb 12 oz (53.865 kg)  10/10/14 117 lb (53.071 kg)  10/04/14 115 lb (52.164 kg)   Past Medical History  Diagnosis Date  . Anxiety   . Paroxysmal supraventricular tachycardia   . Uterine prolapse without mention of vaginal wall prolapse   . HTN (hypertension)   . HLD (hyperlipidemia)   . Intolerance of drug     orthostatic  . PVD (peripheral vascular disease)   . Allergic rhinitis   . Syncope and collapse    Past Surgical History  Procedure Laterality Date  . Corrective eye surgery      as a child  . Cholecystectomy    . Tonsillectomy    . Ivd removed    . Stress cardiolite  08/05/93  . Rf ablation psvt      summer '10  . Total hip arthroplasty Right 08/29/2014    Procedure: Right Hip Hemi Arthroplasty;  Surgeon: Marianna Payment, MD;  Location: [facility];  Service: Orthopedics;  Laterality: Right;  Hip procedure 1st wants [employer], [employer], [employer] [place]. Wrist - wants [employer].  Surgeon available at 1645.  Surgeon to inform sales reps.  Marland Kitchen Open reduction internal fixation (orif) distal radial fracture Right 08/29/2014    Procedure: OPEN REDUCTION INTERNAL FIXATION (ORIF) DISTAL RADIAL FRACTURE;  Surgeon: Marianna Payment, MD;  Location: [facility];  Service: Orthopedics;  Laterality: Right;    reports that she quit smoking about 58 years ago. She has never used smokeless tobacco. She reports that she does not drink alcohol or use illicit drugs. family history includes Arthritis in her father; Coronary artery disease in her brother; Heart attack in her brother; Heart disease in her brother; Hyperlipidemia in her sister; Hypertension in her brother, sister, and another family member; Mental illness in her father. There is no history of Colon cancer, Breast cancer, Diabetes, Stroke, Diabetes, or Diabetes. Allergies  Allergen Reactions  . Chocolate   . Codeine   . Diazepam   . Fruit & Vegetable Daily [Nutritional Supplements] Hives and Swelling    peaches  . Iodine   . Iohexol      Code: HIVES, Desc: pt gets 13 hr pre-meds, Onset Date: [id number]   . Latex Hives  . Peach [Prunus Persica]   . Peanut-Containing Drug Products Hives and Swelling  . Penicillins Hives, Itching and Swelling  . Strawberry   . Sulfonamide Derivatives   . Wheat     Shortness of breath   Current Outpatient Prescriptions on File Prior to Visit  Medication Sig Dispense Refill  . acetaminophen (TYLENOL) 500 MG tablet Take 1 tablet (500 mg total) by mouth every 6 (six) hours as needed. 30 tablet 0  . ALPRAZolam (XANAX) 0.25  MG tablet 1 tab by mouth twice per day and 1-2 tabs at bedtime if needed for sleep 90 tablet 5  . apixaban (ELIQUIS) 2.5 MG TABS tablet Take 1 tablet (2.5 mg total) by mouth 2 (two) times daily. 60 tablet 11  . diltiazem (DILACOR XR) 180 MG 24 hr capsule Take 180 mg by mouth daily.    Marland Kitchen levETIRAcetam (KEPPRA) 250 MG tablet Take 250 mg by mouth 2 (two) times daily.    . rosuvastatin (CRESTOR) 5 MG tablet Take 5 mg by mouth at bedtime.     No current facility-administered medications on file prior to visit.   Review of Systems  Constitutional: Negative for unusual diaphoresis or other sweats  HENT: Negative for  ringing in ear Eyes: Negative for double vision or worsening visual disturbance.  Respiratory: Negative for choking and stridor.   Gastrointestinal: Negative for vomiting or other signifcant bowel change Genitourinary: Negative for hematuria or decreased urine volume.  Musculoskeletal: Negative for other MSK pain or swelling Skin: Negative for color change and worsening wound.  Neurological: Negative for tremors and numbness other than noted  Psychiatric/Behavioral: Negative for decreased concentration or agitation other than above       Objective:   Physical Exam BP 136/70 mmHg  Pulse 55  Temp(Src) 97.9 F (36.6 C) (Oral)  Ht 5\' 5"  (1.651 m)  Wt 118 lb 12 oz (53.865 kg)  BMI 19.76 kg/m2  SpO2 95% VS noted, walks with walker Constitutional: Pt appears well-developed, well-nourished.  HENT: Head: NCAT.  Right Ear: External ear normal.  Left Ear: External ear normal.  Eyes: . Pupils are equal, round, and reactive to light. Conjunctivae and EOM are normal Neck: Normal range of motion. Neck supple.  Cardiovascular: Normal rate and regular rhythm.   Pulmonary/Chest: Effort normal and breath sounds without rales or wheezing.  Neurological: Pt is alert. Not confused , motor grossly intact Skin: Skin is warm. No rash Psychiatric: Pt behavior is normal. No agitation. not depressed affect    Assessment & Plan:

## 2014-10-17 NOTE — Telephone Encounter (Signed)
S/w pt's daughter [name] that BP readings lokm ok per Brynda Rim. PA. I advised he did state he wanted pt to be seen sooner, so daughter and I resched to 1/6 9:30. Daughter said thank you for the sooner appt.

## 2014-10-17 NOTE — Progress Notes (Signed)
Pre visit review using our clinic review tool, if applicable. No additional management support is needed unless otherwise documented below in the visit note. 

## 2014-10-21 DIAGNOSIS — M25431 Effusion, right wrist: Secondary | ICD-10-CM | POA: Diagnosis not present

## 2014-10-21 DIAGNOSIS — M25639 Stiffness of unspecified wrist, not elsewhere classified: Secondary | ICD-10-CM | POA: Diagnosis not present

## 2014-10-21 DIAGNOSIS — M25651 Stiffness of right hip, not elsewhere classified: Secondary | ICD-10-CM | POA: Diagnosis not present

## 2014-10-21 DIAGNOSIS — M25451 Effusion, right hip: Secondary | ICD-10-CM | POA: Diagnosis not present

## 2014-10-22 NOTE — Telephone Encounter (Signed)
APPOINTMENT HAS BEEN RESCHEDULED TO 10/23/14 WITH SCOTT WEAVER.

## 2014-10-22 NOTE — Assessment & Plan Note (Signed)
stable overall by history and exam, recent data reviewed with pt, and pt to continue medical treatment as before,  to f/u any worsening symptoms or concerns BP Readings from Last 3 Encounters:  10/17/14 136/70  10/10/14 138/55  10/04/14 140/70

## 2014-10-22 NOTE — Assessment & Plan Note (Signed)
stable overall by history and exam, and pt to continue medical treatment as before,  to f/u any worsening symptoms or concerns 

## 2014-10-22 NOTE — Assessment & Plan Note (Signed)
stable overall by history and exam, recent data reviewed with pt, and pt to continue medical treatment as before,  to f/u any worsening symptoms or concerns Lab Results  Component Value Date   LDLCALC 93 09/24/2014

## 2014-10-23 ENCOUNTER — Ambulatory Visit (INDEPENDENT_AMBULATORY_CARE_PROVIDER_SITE_OTHER): Payer: Medicare Other | Admitting: Physician Assistant

## 2014-10-23 ENCOUNTER — Encounter: Payer: Self-pay | Admitting: Physician Assistant

## 2014-10-23 VITALS — BP 132/58 | HR 78 | Ht 65.0 in | Wt 116.1 lb

## 2014-10-23 DIAGNOSIS — E785 Hyperlipidemia, unspecified: Secondary | ICD-10-CM | POA: Diagnosis not present

## 2014-10-23 DIAGNOSIS — R0683 Snoring: Secondary | ICD-10-CM

## 2014-10-23 DIAGNOSIS — I481 Persistent atrial fibrillation: Secondary | ICD-10-CM

## 2014-10-23 DIAGNOSIS — I951 Orthostatic hypotension: Secondary | ICD-10-CM | POA: Diagnosis not present

## 2014-10-23 DIAGNOSIS — M25431 Effusion, right wrist: Secondary | ICD-10-CM | POA: Diagnosis not present

## 2014-10-23 DIAGNOSIS — I10 Essential (primary) hypertension: Secondary | ICD-10-CM

## 2014-10-23 DIAGNOSIS — M25651 Stiffness of right hip, not elsewhere classified: Secondary | ICD-10-CM | POA: Diagnosis not present

## 2014-10-23 DIAGNOSIS — I4819 Other persistent atrial fibrillation: Secondary | ICD-10-CM

## 2014-10-23 DIAGNOSIS — I5032 Chronic diastolic (congestive) heart failure: Secondary | ICD-10-CM

## 2014-10-23 DIAGNOSIS — M25639 Stiffness of unspecified wrist, not elsewhere classified: Secondary | ICD-10-CM | POA: Diagnosis not present

## 2014-10-23 DIAGNOSIS — M25451 Effusion, right hip: Secondary | ICD-10-CM | POA: Diagnosis not present

## 2014-10-23 LAB — COMPREHENSIVE METABOLIC PANEL
ALBUMIN: 3.6 g/dL (ref 3.5–5.2)
ALK PHOS: 70 U/L (ref 39–117)
ALT: 11 U/L (ref 0–35)
AST: 16 U/L (ref 0–37)
BILIRUBIN TOTAL: 0.6 mg/dL (ref 0.2–1.2)
BUN: 16 mg/dL (ref 6–23)
CHLORIDE: 107 meq/L (ref 96–112)
CO2: 28 mEq/L (ref 19–32)
CREATININE: 0.9 mg/dL (ref 0.4–1.2)
Calcium: 9.3 mg/dL (ref 8.4–10.5)
GFR: 64.75 mL/min (ref >60.00)
Glucose, Bld: 83 mg/dL (ref 70–99)
Potassium: 3.8 mEq/L (ref 3.5–5.1)
Sodium: 141 mEq/L (ref 135–145)
Total Protein: 6.7 g/dL (ref 6.0–8.3)

## 2014-10-23 NOTE — Patient Instructions (Signed)
Your physician recommends that you continue on your current medications as directed. Please refer to the Current Medication list given to you today.  Your physician recommends that you return for lab work in: TODAY; CMET  Your physician has recommended that you have a sleep study. PT WILL CALL BACK WHEN READY TO SCHEDULE. This test records several body functions during sleep, including: brain activity, eye movement, oxygen and carbon dioxide blood levels, heart rate and rhythm, breathing rate and rhythm, the flow of air through your mouth and nose, snoring, body muscle movements, and chest and belly movement.  Your physician recommends that you schedule a follow-up appointment in: 2 MONTHS WITH DR. ROSS   CALL BACK IF YOU DECIDE YOU WANT TO PROCEED WITH [facility] [phone]

## 2014-10-23 NOTE — Progress Notes (Signed)
Cardiology Office Note   Date:  10/23/2014   ID:  Kayla Kent, DOB Apr 02, 1932, MRN [id number]  PCP:  Cathlean Cower, MD  Cardiologist:  Dr. Dorris Carnes     History of Present Illness: Kayla Kent is a 81 y.o. 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 Patient suffered a R hip fracture in 08/2014 s/p ORIF. She wasin the hospital 09/2014 for left lower lobe pneumonia. She had TIA symptoms with rapid improvement. Echocardiogram demonstrated normal LV function. She was noted to have volume excess and was diuresed. This was, complicated by orthostatic hypotension. She developed atrial flutter and was seen by cardiology.  CHADS2-VASc=8.  She was started on anticoagulation with Apixaban. After discharge she developed more symptoms of orthostatic hypotension. Beta blocker was held. She saw Dr. Harrington Challenger 10/04/14. She was still in atrial fibrillation with controlled rate. Low-dose Midodrine was added for her blood pressure. I saw her in FU 12/24.  She was doing better symptomatically.  But, her BP continued to drop with standing.  I adjusted her Midodrine further.  She remained on Apixaban.  I brought her back today for FU with an eye towards DCCV if she remained in AFib.    She is here with her daughter.  She is feeling better.  She really denies any significant dizziness.  She denies chest pain, significant dyspnea, orthopnea, PND, syncope.  She denies palpitations.     Studies:   - Echo (12/15):  EF 55-60%, normal wall motion, grade 3 diastolic dysfunction, trivial AI, trivial MR, severe LAE  - Carotid US (12/15):  1- 39 percent stenosis involving the right internal carotid artery and the left internal carotid artery.   Recent Labs: 09/21/2014: Pro B Natriuretic peptide (BNP) 6652.0* 09/24/2014: LDL (calc) 93 10/04/2014: BUN 19; Creatinine 1.0; Hemoglobin 13.3; Potassium 4.3; Sodium 141   Estimated Creatinine Clearance: 37.3 mL/min  (by C-G formula based on Cr of 1).     Wt Readings from Last 3 Encounters:  10/23/14 116 lb 1.9 oz (52.672 kg)  10/17/14 118 lb 12 oz (53.865 kg)  10/10/14 117 lb (53.071 kg)     Past Medical History  Diagnosis Date  . Anxiety   . Paroxysmal supraventricular tachycardia   . Uterine prolapse without mention of vaginal wall prolapse   . HTN (hypertension)   . HLD (hyperlipidemia)   . Intolerance of drug     orthostatic  . PVD (peripheral vascular disease)   . Allergic rhinitis   . Syncope and collapse     Current Outpatient Prescriptions  Medication Sig Dispense Refill  . acetaminophen (TYLENOL) 500 MG tablet Take 1 tablet (500 mg total) by mouth every 6 (six) hours as needed. 30 tablet 0  . ALPRAZolam (XANAX) 0.25 MG tablet 1 tab by mouth twice per day and 1-2 tabs at bedtime if needed for sleep 90 tablet 5  . apixaban (ELIQUIS) 2.5 MG TABS tablet Take 1 tablet (2.5 mg total) by mouth 2 (two) times daily. 60 tablet 11  . diltiazem (DILACOR XR) 180 MG 24 hr capsule Take 180 mg by mouth daily.    Marland Kitchen levETIRAcetam (KEPPRA) 250 MG tablet Take 250 mg by mouth 2 (two) times daily.    . midodrine (PROAMATINE) 2.5 MG tablet 2 tabs by mouth in the AM, and 1 in PM 60 tablet 10  . rosuvastatin (CRESTOR) 5 MG tablet Take 5 mg by mouth at bedtime.  No current facility-administered medications for this visit.     Allergies:   Chocolate; Codeine; Diazepam; Sulfonamide derivatives; Fruit & vegetable daily; Iohexol; Latex; Peach; Peanut-containing drug products; Penicillins; Strawberry; Wheat; and Iodine   Social History:  The patient  reports that she quit smoking about 58 years ago. She has never used smokeless tobacco. She reports that she does not drink alcohol or use illicit drugs.   Family History:  The patient's family history includes Arthritis in her father; Coronary artery disease in her brother; Heart attack in her brother; Heart disease in her brother; Hyperlipidemia in her  sister; Hypertension in her brother, sister, and another family member; Mental illness in her father. There is no history of Colon cancer, Breast cancer, Diabetes, Stroke, Diabetes, or Diabetes.    ROS:  Please see the history of present illness.     All other systems reviewed and negative.    PHYSICAL EXAM: VS:  BP 132/58 mmHg  Pulse 78  Ht 5\' 5"  (1.651 m)  Wt 116 lb 1.9 oz (52.672 kg)  BMI 19.32 kg/m2  SpO2 98%   Orthostatic VS for the past 24 hrs:  BP- Lying Pulse- Lying BP- Sitting Pulse- Sitting BP- Standing at 0 minutes Pulse- Standing at 0 minutes  10/23/14 1005 155/66 mmHg 73 130/63 mmHg 82 108/52 mmHg 83    Well nourished, well developed, in no acute distress HEENT: normal Neck:  no JVD Cardiac:  normal S1, S2;  irreg irreg rhythm, no murmur   Lungs:   clear to auscultation bilaterally, no wheezing, rhonchi or rales Abd: soft, nontender, no hepatomegaly Ext:  no edema Skin: warm and dry Neuro:  CNs 2-12 intact, no focal abnormalities noted  EKG:  AFib, HR 74, NSSTTW changes, no change from prior tracing.    ASSESSMENT AND PLAN:   1. Orthostatic Hypotension:  She still has a BP drop with standing.  But, she is mostly asymptomatic.  I have recommended she remain on the same dose of Midodrine.  Check CMET today.  Continue compression stockings.  2. Persistent Atrial Fibrillation:  She remains in AFib.  For the most part she is not symptomatic.  She has a large LA on echo.  She has remained on Eliquis 2.5 mg bid (age 85, < 60 kg).  She is not sure if she would like to pursue DCCV.  I am not sure that she would hold NSR with her LAE.  She does admit to snoring hx.  She could certainly continue a rate control strategy.      -  She will call to let us know if she wants to schedule DCCV.    -  Arrange sleep study to assess for OSA. 3. Chronic Diastolic CHF:  Volume stable without diuretics.  4. Hypertension:  Controlled.  5. Hyperlipidemia:  Continue statin.   Disposition:    FU with Dr. Dorris Carnes 2 mos.    Signed, Versie Starks, MHS 10/23/2014 10:19 AM    [facility] [place], [place], [place]  [address] Phone: [phone]; Fax: [phone]

## 2014-10-24 ENCOUNTER — Telehealth: Payer: Self-pay | Admitting: *Deleted

## 2014-10-24 NOTE — Telephone Encounter (Signed)
ptcb and has been notified of normal lab results with verbal understanding

## 2014-10-25 ENCOUNTER — Telehealth: Payer: Self-pay | Admitting: Internal Medicine

## 2014-10-25 DIAGNOSIS — M25651 Stiffness of right hip, not elsewhere classified: Secondary | ICD-10-CM | POA: Diagnosis not present

## 2014-10-25 DIAGNOSIS — Z01812 Encounter for preprocedural laboratory examination: Secondary | ICD-10-CM

## 2014-10-25 DIAGNOSIS — M25431 Effusion, right wrist: Secondary | ICD-10-CM | POA: Diagnosis not present

## 2014-10-25 DIAGNOSIS — M25451 Effusion, right hip: Secondary | ICD-10-CM | POA: Diagnosis not present

## 2014-10-25 DIAGNOSIS — I4891 Unspecified atrial fibrillation: Secondary | ICD-10-CM

## 2014-10-25 DIAGNOSIS — M25639 Stiffness of unspecified wrist, not elsewhere classified: Secondary | ICD-10-CM | POA: Diagnosis not present

## 2014-10-25 NOTE — Telephone Encounter (Signed)
New Msg           Pt daughter [name] calling to schedule cardioversion. Please call.

## 2014-10-25 NOTE — Telephone Encounter (Signed)
Left message for daughter Kayla Kent to call back. Also left message on patient's home # but this may be the patient's home--patient living with her daughter.  Per last OV note from [facility], patient would like DCCV.  Unsure when they'd like to schedule.  Asked in the voicemail to call back with more details.

## 2014-10-28 DIAGNOSIS — M25639 Stiffness of unspecified wrist, not elsewhere classified: Secondary | ICD-10-CM | POA: Diagnosis not present

## 2014-10-28 DIAGNOSIS — M25451 Effusion, right hip: Secondary | ICD-10-CM | POA: Diagnosis not present

## 2014-10-28 DIAGNOSIS — M25651 Stiffness of right hip, not elsewhere classified: Secondary | ICD-10-CM | POA: Diagnosis not present

## 2014-10-28 DIAGNOSIS — M25431 Effusion, right wrist: Secondary | ICD-10-CM | POA: Diagnosis not present

## 2014-10-29 NOTE — Telephone Encounter (Signed)
F/u   Pt's daughter returning your call from Friday 1.8.16..please call

## 2014-10-30 ENCOUNTER — Encounter: Payer: Self-pay | Admitting: *Deleted

## 2014-10-30 DIAGNOSIS — M25639 Stiffness of unspecified wrist, not elsewhere classified: Secondary | ICD-10-CM | POA: Diagnosis not present

## 2014-10-30 DIAGNOSIS — M25451 Effusion, right hip: Secondary | ICD-10-CM | POA: Diagnosis not present

## 2014-10-30 DIAGNOSIS — M25651 Stiffness of right hip, not elsewhere classified: Secondary | ICD-10-CM | POA: Diagnosis not present

## 2014-10-30 DIAGNOSIS — M25431 Effusion, right wrist: Secondary | ICD-10-CM | POA: Diagnosis not present

## 2014-10-30 NOTE — Telephone Encounter (Signed)
Spoke with patient's daughter. Doing "okay" with midodrine.  Not exceptional, blood pressure has increased some per daughter.  They would like to schedule cardioversion. Daughter states patient has not missed any doses of eliquis. Scheduled for Tue Jan 19 at 10 am with Dr. Marlou Porch. Pt will come for labs on Friday. Will leave instruction letter at front desk to pick up at that time.

## 2014-11-01 ENCOUNTER — Other Ambulatory Visit: Payer: Self-pay | Admitting: Physician Assistant

## 2014-11-01 ENCOUNTER — Other Ambulatory Visit (INDEPENDENT_AMBULATORY_CARE_PROVIDER_SITE_OTHER): Payer: Medicare Other | Admitting: *Deleted

## 2014-11-01 DIAGNOSIS — M25451 Effusion, right hip: Secondary | ICD-10-CM | POA: Diagnosis not present

## 2014-11-01 DIAGNOSIS — M25639 Stiffness of unspecified wrist, not elsewhere classified: Secondary | ICD-10-CM | POA: Diagnosis not present

## 2014-11-01 DIAGNOSIS — I4891 Unspecified atrial fibrillation: Secondary | ICD-10-CM | POA: Diagnosis not present

## 2014-11-01 DIAGNOSIS — M25651 Stiffness of right hip, not elsewhere classified: Secondary | ICD-10-CM | POA: Diagnosis not present

## 2014-11-01 DIAGNOSIS — M25431 Effusion, right wrist: Secondary | ICD-10-CM | POA: Diagnosis not present

## 2014-11-01 DIAGNOSIS — Z01812 Encounter for preprocedural laboratory examination: Secondary | ICD-10-CM

## 2014-11-01 DIAGNOSIS — I4819 Other persistent atrial fibrillation: Secondary | ICD-10-CM

## 2014-11-01 LAB — BASIC METABOLIC PANEL
BUN: 16 mg/dL (ref 6–23)
CO2: 27 mEq/L (ref 19–32)
Calcium: 9.1 mg/dL (ref 8.4–10.5)
Chloride: 105 mEq/L (ref 96–112)
Creatinine, Ser: 0.87 mg/dL (ref 0.40–1.20)
GFR: 66.47 mL/min (ref >60.00)
GLUCOSE: 77 mg/dL (ref 70–99)
Potassium: 3.7 mEq/L (ref 3.5–5.1)
Sodium: 141 mEq/L (ref 135–145)

## 2014-11-01 LAB — CBC
HEMATOCRIT: 37.9 % (ref 36.0–46.0)
HEMOGLOBIN: 12.2 g/dL (ref 12.0–15.0)
MCHC: 32.3 g/dL (ref 30.0–36.0)
MCV: 88.9 fl (ref 78.0–100.0)
Platelets: 242 10*3/uL (ref 150.0–400.0)
RBC: 4.26 Mil/uL (ref 3.87–5.11)
RDW: 15.8 % — ABNORMAL HIGH (ref 11.5–15.5)
WBC: 9.4 10*3/uL (ref 4.0–10.5)

## 2014-11-01 LAB — PROTIME-INR
INR: 1.5 ratio — ABNORMAL HIGH (ref 0.8–1.0)
PROTHROMBIN TIME: 16.1 s — AB (ref 9.6–13.1)

## 2014-11-01 NOTE — Telephone Encounter (Signed)
H&P and orders written. Richardson Dopp, PA-C   11/01/2014 11:12 AM

## 2014-11-01 NOTE — H&P (Signed)
Cardiology Office Note   Date: 10/23/2014   ID: Kayla Kent, DOB 12/29/1931, MRN [id number]   PCP: Cathlean Cower, MD  Cardiologist: Dr. Dorris Carnes   History of Present Illness:  Kayla Kent is a 81 y.o. 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 Patient suffered a R hip fracture in 08/2014 s/p ORIF. She was in the hospital 09/2014 for left lower lobe pneumonia. She had TIA symptoms with rapid improvement. Echocardiogram demonstrated normal LV function. She was noted to have volume excess and was diuresed. This was, complicated by orthostatic hypotension. She developed atrial flutter and was seen by cardiology. CHADS2-VASc=8. She was started on anticoagulation with Apixaban. After discharge she developed more symptoms of orthostatic hypotension. Beta blocker was held. She saw Dr. Harrington Challenger 10/04/14. She was still in atrial fibrillation with controlled rate. Low-dose Midodrine was added for her blood pressure. I saw her in FU 12/24. She was doing better symptomatically. But, her BP continued to drop with standing. I adjusted her Midodrine further. She remained on Apixaban. I brought her back today for FU with an eye towards DCCV if she remained in AFib.   She is here with her daughter. She is feeling better. She really denies any significant dizziness. She denies chest pain, significant dyspnea, orthopnea, PND, syncope. She denies palpitations.    Studies:  - Echo (12/15): EF 55-60%, normal wall motion, grade 3 diastolic dysfunction, trivial AI, trivial MR, severe LAE  - Carotid US (12/15): 1- 39 percent stenosis involving the right internal carotid artery and the left internal carotid artery.   Recent Labs:  09/21/2014: Pro B Natriuretic peptide (BNP) 6652.0*  09/24/2014: LDL (calc) 93  10/04/2014: BUN 19; Creatinine 1.0; Hemoglobin 13.3; Potassium 4.3; Sodium 141  Estimated Creatinine Clearance: 37.3 mL/min (by C-G formula based on  Cr of 1).    Wt Readings from Last 3 Encounters:   10/23/14  116 lb 1.9 oz (52.672 kg)   10/17/14  118 lb 12 oz (53.865 kg)   10/10/14  117 lb (53.071 kg)     Past Medical History   Diagnosis  Date   .  Anxiety    .  Paroxysmal supraventricular tachycardia    .  Uterine prolapse without mention of vaginal wall prolapse    .  HTN (hypertension)    .  HLD (hyperlipidemia)    .  Intolerance of drug      orthostatic   .  PVD (peripheral vascular disease)    .  Allergic rhinitis    .  Syncope and collapse      Current Outpatient Prescriptions   Medication  Sig  Dispense  Refill   .  acetaminophen (TYLENOL) 500 MG tablet  Take 1 tablet (500 mg total) by mouth every 6 (six) hours as needed.  30 tablet  0   .  ALPRAZolam (XANAX) 0.25 MG tablet  1 tab by mouth twice per day and 1-2 tabs at bedtime if needed for sleep  90 tablet  5   .  apixaban (ELIQUIS) 2.5 MG TABS tablet  Take 1 tablet (2.5 mg total) by mouth 2 (two) times daily.  60 tablet  11   .  diltiazem (DILACOR XR) 180 MG 24 hr capsule  Take 180 mg by mouth daily.     Marland Kitchen  levETIRAcetam (KEPPRA) 250 MG tablet  Take 250 mg by mouth 2 (two) times daily.     Marland Kitchen  midodrine (PROAMATINE) 2.5 MG tablet  2 tabs by mouth in the AM, and 1 in PM  60 tablet  10   .  rosuvastatin (CRESTOR) 5 MG tablet  Take 5 mg by mouth at bedtime.      No current facility-administered medications for this visit.     Allergies: Chocolate; Codeine; Diazepam; Sulfonamide derivatives; Fruit & vegetable daily; Iohexol; Latex; Peach; Peanut-containing drug products; Penicillins; Strawberry; Wheat; and Iodine   Social History: The patient reports that she quit smoking about 58 years ago. She has never used smokeless tobacco. She reports that she does not drink alcohol or use illicit drugs.   Family History: The patient's family history includes Arthritis in her father; Coronary artery disease in her brother; Heart attack in her brother; Heart disease in her  brother; Hyperlipidemia in her sister; Hypertension in her brother, sister, and another family member; Mental illness in her father. There is no history of Colon cancer, Breast cancer, Diabetes, Stroke, Diabetes, or Diabetes.   ROS: Please see the history of present illness. All other systems reviewed and negative.    PHYSICAL EXAM:  VS: BP 132/58 mmHg  Pulse 78  Ht 5\' 5"  (1.651 m)  Wt 116 lb 1.9 oz (52.672 kg)  BMI 19.32 kg/m2  SpO2 98%   Orthostatic VS for the past 24 hrs:  BP- Lying  Pulse- Lying  BP- Sitting  Pulse- Sitting  BP- Standing at 0 minutes  Pulse- Standing at 0 minutes   10/23/14 1005  155/66 mmHg  73  130/63 mmHg  82  108/52 mmHg  83    Well nourished, well developed, in no acute distress  HEENT: normal  Neck: no JVD  Cardiac: normal S1, S2; irreg irreg rhythm, no murmur  Lungs: clear to auscultation bilaterally, no wheezing, rhonchi or rales  Abd: soft, nontender, no hepatomegaly  Ext: no edema  Skin: warm and dry  Neuro: CNs 2-12 intact, no focal abnormalities noted    EKG: AFib, HR 74, NSSTTW changes, no change from prior tracing.    ASSESSMENT AND PLAN:  1. Orthostatic Hypotension: She still has a BP drop with standing. But, she is mostly asymptomatic. I have recommended she remain on the same dose of Midodrine. Check CMET today. Continue compression stockings.  2. Persistent Atrial Fibrillation: She remains in AFib. For the most part she is not symptomatic. She has a large LA on echo. She has remained on Eliquis 2.5 mg bid (age 22, < 60 kg). She is not sure if she would like to pursue DCCV. I am not sure that she would hold NSR with her LAE. She does admit to snoring hx. She could certainly continue a rate control strategy.  - She will call to let us know if she wants to schedule DCCV.  - Arrange sleep study to assess for OSA.  3. Chronic Diastolic CHF: Volume stable without diuretics.  4. Hypertension: Controlled.  5. Hyperlipidemia: Continue statin.     Disposition: FU with Dr. Dorris Carnes 2 mos.    Signed,  Versie Starks, MHS  10/23/2014 10:19 AM  [facility]  [address], [place], [place] [address]  Phone: [phone]; Fax: [phone]   Addendum 11/01/2014 11:06 AM:  Patient called in this week requesting that she proceed with cardioversion as discussed with her previously.  She has been on Eliquis for > 3 weeks.  DCCV has been arranged with Dr. Candee Furbish.   Signed,  Richardson Dopp,  PA-C   11/01/2014 11:07 AM

## 2014-11-05 ENCOUNTER — Ambulatory Visit (HOSPITAL_COMMUNITY): Payer: Medicare Other | Admitting: Certified Registered Nurse Anesthetist

## 2014-11-05 ENCOUNTER — Encounter (HOSPITAL_COMMUNITY): Payer: Self-pay | Admitting: Certified Registered Nurse Anesthetist

## 2014-11-05 ENCOUNTER — Ambulatory Visit (HOSPITAL_COMMUNITY)
Admission: RE | Admit: 2014-11-05 | Discharge: 2014-11-05 | Disposition: A | Discharge status: Home / Self Care | Payer: Medicare Other | Source: Ambulatory Visit | Attending: Cardiology | Admitting: Cardiology

## 2014-11-05 ENCOUNTER — Encounter (HOSPITAL_COMMUNITY)
Admission: RE | Disposition: A | Discharge status: Home / Self Care | Payer: Self-pay | Source: Ambulatory Visit | Attending: Cardiology

## 2014-11-05 DIAGNOSIS — I472 Ventricular tachycardia: Secondary | ICD-10-CM | POA: Diagnosis not present

## 2014-11-05 DIAGNOSIS — I4891 Unspecified atrial fibrillation: Secondary | ICD-10-CM | POA: Diagnosis not present

## 2014-11-05 DIAGNOSIS — Z87891 Personal history of nicotine dependence: Secondary | ICD-10-CM | POA: Diagnosis not present

## 2014-11-05 DIAGNOSIS — I4819 Other persistent atrial fibrillation: Secondary | ICD-10-CM

## 2014-11-05 DIAGNOSIS — I480 Paroxysmal atrial fibrillation: Secondary | ICD-10-CM

## 2014-11-05 DIAGNOSIS — I739 Peripheral vascular disease, unspecified: Secondary | ICD-10-CM | POA: Diagnosis not present

## 2014-11-05 DIAGNOSIS — E785 Hyperlipidemia, unspecified: Secondary | ICD-10-CM | POA: Insufficient documentation

## 2014-11-05 DIAGNOSIS — I10 Essential (primary) hypertension: Secondary | ICD-10-CM | POA: Insufficient documentation

## 2014-11-05 DIAGNOSIS — F419 Anxiety disorder, unspecified: Secondary | ICD-10-CM | POA: Insufficient documentation

## 2014-11-05 DIAGNOSIS — I509 Heart failure, unspecified: Secondary | ICD-10-CM | POA: Diagnosis not present

## 2014-11-05 DIAGNOSIS — F329 Major depressive disorder, single episode, unspecified: Secondary | ICD-10-CM | POA: Insufficient documentation

## 2014-11-05 HISTORY — PX: CARDIOVERSION: SHX1299

## 2014-11-05 SURGERY — CARDIOVERSION
Anesthesia: General

## 2014-11-05 MED ORDER — SODIUM CHLORIDE 0.9 % IV SOLN
250.0000 mL | INTRAVENOUS | Status: DC
  Administered 2014-11-05: 500 mL via INTRAVENOUS

## 2014-11-05 MED ORDER — SODIUM CHLORIDE 0.9 % IJ SOLN: 3.0000 mL | INTRAMUSCULAR | Status: DC | PRN

## 2014-11-05 MED ORDER — HYDROCORTISONE 1 % EX CREA: 1.0000 "application " | TOPICAL_CREAM | Freq: Three times a day (TID) | CUTANEOUS | Status: DC | PRN

## 2014-11-05 MED ORDER — LIDOCAINE HCL (CARDIAC) 20 MG/ML IV SOLN
INTRAVENOUS | Status: DC | PRN
  Administered 2014-11-05: 40 mg via INTRAVENOUS

## 2014-11-05 MED ORDER — SODIUM CHLORIDE 0.9 % IJ SOLN: 3.0000 mL | Freq: Two times a day (BID) | INTRAMUSCULAR | Status: DC

## 2014-11-05 MED ORDER — PROPOFOL 10 MG/ML IV BOLUS
INTRAVENOUS | Status: DC | PRN
  Administered 2014-11-05: 40 mg via INTRAVENOUS

## 2014-11-05 NOTE — Anesthesia Procedure Notes (Signed)
Procedure Name: MAC Date/Time: 11/05/2014 10:13 AM Performed by: Garrison Columbus T Pre-anesthesia Checklist: Patient identified, Emergency Drugs available, Suction available, Patient being monitored and Timeout performed Patient Re-evaluated:Patient Re-evaluated prior to inductionOxygen Delivery Method: Ambu bag Preoxygenation: Pre-oxygenation with 100% oxygen Intubation Type: IV induction Ventilation: Mask ventilation without difficulty Placement Confirmation: positive ETCO2 and breath sounds checked- equal and bilateral Dental Injury: Teeth and Oropharynx as per pre-operative assessment

## 2014-11-05 NOTE — Anesthesia Preprocedure Evaluation (Addendum)
Anesthesia Evaluation  Patient identified by MRN, date of birth, ID band Patient awake    Reviewed: Allergy & Precautions, NPO status , Patient's Chart, lab work & pertinent test results  Airway Mallampati: II  TM Distance: >3 FB Neck ROM: Full    Dental  (+) Teeth Intact, Dental Advisory Given   Pulmonary former smoker,  breath sounds clear to auscultation        Cardiovascular hypertension, Pt. on medications + Peripheral Vascular Disease and +CHF + dysrhythmias Atrial Fibrillation and Supra Ventricular Tachycardia Rhythm:Irregular Rate:Normal     Neuro/Psych PSYCHIATRIC DISORDERS Anxiety Depression    GI/Hepatic negative GI ROS, Neg liver ROS,   Endo/Other    Renal/GU negative Renal ROS     Musculoskeletal   Abdominal (+)  Abdomen: soft.    Peds  Hematology   Anesthesia Other Findings   Reproductive/Obstetrics                          Anesthesia Physical Anesthesia Plan  ASA: III  Anesthesia Plan: General   Post-op Pain Management:    Induction: Intravenous  Airway Management Planned: Mask, Natural Airway and Nasal Cannula  Additional Equipment:   Intra-op Plan:   Post-operative Plan:   Informed Consent: I have reviewed the patients History and Physical, chart, labs and discussed the procedure including the risks, benefits and alternatives for the proposed anesthesia with the patient or authorized representative who has indicated his/her understanding and acceptance.   Dental advisory given  Plan Discussed with: CRNA, Anesthesiologist and Surgeon  Anesthesia Plan Comments:       Anesthesia Quick Evaluation

## 2014-11-05 NOTE — Transfer of Care (Signed)
Immediate Anesthesia Transfer of Care Note  Patient: Kayla Kent  Procedure(s) Performed: Procedure(s): CARDIOVERSION (N/A)  Patient Location: Endoscopy Unit  Anesthesia Type:General  Level of Consciousness: awake, alert  and oriented  Airway & Oxygen Therapy: Patient Spontanous Breathing and Patient connected to nasal cannula oxygen  Post-op Assessment: Report given to PACU RN, Post -op Vital signs reviewed and stable and Patient moving all extremities X 4  Post vital signs: Reviewed and stable  Complications: No apparent anesthesia complications

## 2014-11-05 NOTE — Interval H&P Note (Signed)
History and Physical Interval Note:  11/05/2014 9:06 AM  Kayla Kent  has presented today for surgery, with the diagnosis of AFIB  The various methods of treatment have been discussed with the patient and family. After consideration of risks, benefits and other options for treatment, the patient has consented to  Procedure(s): CARDIOVERSION (N/A) as a surgical intervention .  The patient's history has been reviewed, patient examined, no change in status, stable for surgery.  I have reviewed the patient's chart and labs.  Questions were answered to the patient's satisfaction.     [name], [name]

## 2014-11-05 NOTE — Anesthesia Postprocedure Evaluation (Signed)
  Anesthesia Post-op Note  Patient: Kayla Kent  Procedure(s) Performed: Procedure(s): CARDIOVERSION (N/A)  Patient Location: PACU  Anesthesia Type:MAC  Level of Consciousness: awake, alert  and oriented  Airway and Oxygen Therapy: Patient Spontanous Breathing and Patient connected to nasal cannula oxygen  Post-op Pain: none  Post-op Assessment: Post-op Vital signs reviewed, Patient's Cardiovascular Status Stable, Patent Airway and No signs of Nausea or vomiting  Post-op Vital Signs: Reviewed and stable  Last Vitals:  Filed Vitals:   11/05/14 1037  BP: 146/48  Pulse: 77  Temp:   Resp: 21    Complications: No apparent anesthesia complications

## 2014-11-05 NOTE — CV Procedure (Signed)
    Electrical Cardioversion Procedure Note Kayla Kent [id number] 11-Jul-1932  Procedure: Electrical Cardioversion Indications:  Atrial Fibrillation  Time Out: Verified patient identification, verified procedure,medications/allergies/relevent history reviewed, required imaging and test results available.  Performed  Procedure Details  The patient was NPO after midnight. Anesthesia was administered at the beside  by [facility] with 40mg  of propofol.  Cardioversion was performed with synchronized biphasic defibrillation via AP pads with 120 joules.  1 attempt(s) were performed.  The patient converted to normal sinus rhythm. The patient tolerated the procedure well   IMPRESSION:  Successful cardioversion of atrial fibrillation    [name], [name] 11/05/2014, 10:40 AM

## 2014-11-05 NOTE — Discharge Instructions (Signed)

## 2014-11-05 NOTE — Anesthesia Postprocedure Evaluation (Signed)
  Anesthesia Post-op Note  Patient: Kayla Kent  Procedure(s) Performed: Procedure(s): CARDIOVERSION (N/A)  Patient Location: Endoscopy Unit  Anesthesia Type:General  Level of Consciousness: awake, alert  and oriented  Airway and Oxygen Therapy: Patient Spontanous Breathing  Post-op Pain: none  Post-op Assessment: Post-op Vital signs reviewed, Patient's Cardiovascular Status Stable, Respiratory Function Stable, Patent Airway and No signs of Nausea or vomiting  Post-op Vital Signs: Reviewed and stable  Last Vitals:  Filed Vitals:   11/05/14 0850  BP: 168/48  Temp: 36.6 C  Resp: 16    Complications: No apparent anesthesia complications

## 2014-11-06 ENCOUNTER — Encounter (HOSPITAL_COMMUNITY): Payer: Self-pay | Admitting: Cardiology

## 2014-11-06 DIAGNOSIS — M25451 Effusion, right hip: Secondary | ICD-10-CM | POA: Diagnosis not present

## 2014-11-06 DIAGNOSIS — M25639 Stiffness of unspecified wrist, not elsewhere classified: Secondary | ICD-10-CM | POA: Diagnosis not present

## 2014-11-06 DIAGNOSIS — M25651 Stiffness of right hip, not elsewhere classified: Secondary | ICD-10-CM | POA: Diagnosis not present

## 2014-11-06 DIAGNOSIS — M25431 Effusion, right wrist: Secondary | ICD-10-CM | POA: Diagnosis not present

## 2014-11-11 ENCOUNTER — Ambulatory Visit: Payer: Medicare Other | Admitting: Physician Assistant

## 2014-11-11 DIAGNOSIS — M25451 Effusion, right hip: Secondary | ICD-10-CM | POA: Diagnosis not present

## 2014-11-11 DIAGNOSIS — M25639 Stiffness of unspecified wrist, not elsewhere classified: Secondary | ICD-10-CM | POA: Diagnosis not present

## 2014-11-11 DIAGNOSIS — M25651 Stiffness of right hip, not elsewhere classified: Secondary | ICD-10-CM | POA: Diagnosis not present

## 2014-11-11 DIAGNOSIS — M25431 Effusion, right wrist: Secondary | ICD-10-CM | POA: Diagnosis not present

## 2014-11-13 DIAGNOSIS — M25451 Effusion, right hip: Secondary | ICD-10-CM | POA: Diagnosis not present

## 2014-11-13 DIAGNOSIS — M25651 Stiffness of right hip, not elsewhere classified: Secondary | ICD-10-CM | POA: Diagnosis not present

## 2014-11-13 DIAGNOSIS — M25431 Effusion, right wrist: Secondary | ICD-10-CM | POA: Diagnosis not present

## 2014-11-13 DIAGNOSIS — M25639 Stiffness of unspecified wrist, not elsewhere classified: Secondary | ICD-10-CM | POA: Diagnosis not present

## 2014-11-15 DIAGNOSIS — M25431 Effusion, right wrist: Secondary | ICD-10-CM | POA: Diagnosis not present

## 2014-11-15 DIAGNOSIS — M25451 Effusion, right hip: Secondary | ICD-10-CM | POA: Diagnosis not present

## 2014-11-15 DIAGNOSIS — M25639 Stiffness of unspecified wrist, not elsewhere classified: Secondary | ICD-10-CM | POA: Diagnosis not present

## 2014-11-15 DIAGNOSIS — M25651 Stiffness of right hip, not elsewhere classified: Secondary | ICD-10-CM | POA: Diagnosis not present

## 2014-11-18 DIAGNOSIS — M25651 Stiffness of right hip, not elsewhere classified: Secondary | ICD-10-CM | POA: Diagnosis not present

## 2014-11-18 DIAGNOSIS — M25451 Effusion, right hip: Secondary | ICD-10-CM | POA: Diagnosis not present

## 2014-11-18 DIAGNOSIS — M25639 Stiffness of unspecified wrist, not elsewhere classified: Secondary | ICD-10-CM | POA: Diagnosis not present

## 2014-11-18 DIAGNOSIS — M25431 Effusion, right wrist: Secondary | ICD-10-CM | POA: Diagnosis not present

## 2014-11-18 NOTE — Progress Notes (Signed)
Patient ID: Kayla Kent, female   DOB: 02-Jul-1932, 81 y.o.   MRN: [id number]   09/06/14  Facility:  Nursing Home Location:  [place] Room Number: 702-P LEVEL OF CARE:  SNF (31)   Chief Complaint  Patient presents with  . Discharge Note    Right hip fracture S/P Prosthetic replacement, Right distal radius fracture S/P ORIF, Hypertension, Hyperlipidemia, Constipation, Anxiety, Seizure, Anemia and CHF    HISTORY OF PRESENT ILLNESS:  This is an 81 year old female who is for discharge home and will have outpatient rehabilitation. DME:  Standard rolling walker with right platform and bedside commode. She has been admitted to [facility] on 08/31/14 from [facility] with Right hip fracture S/P Prosthetic replacement and Right distal radius fracture S/P ORIF. She has PMH of Seizure, Hyperlipidemia and CHF.  Patient was admitted to this facility for short-term rehabilitation after the patient's recent hospitalization.  Patient has completed SNF rehabilitation and therapy has cleared the patient for discharge.  PAST MEDICAL HISTORY:  Past Medical History  Diagnosis Date  . Anxiety   . Paroxysmal supraventricular tachycardia   . Uterine prolapse without mention of vaginal wall prolapse   . HTN (hypertension)   . HLD (hyperlipidemia)   . Intolerance of drug     orthostatic  . PVD (peripheral vascular disease)   . Allergic rhinitis   . Syncope and collapse     CURRENT MEDICATIONS: Reviewed per MAR/see medication list  Allergies  Allergen Reactions  . Chocolate Hives  . Codeine Other (See Comments)    Patient states she acts crazy  . Diazepam Other (See Comments)    Patient states she acts crazy.  . Sulfonamide Derivatives Hives    nausea  . Fruit & Vegetable Daily [Nutritional Supplements] Hives and Swelling    peaches  . Iohexol      Code: HIVES, Desc: pt gets 13 hr pre-meds, Onset Date: [id number]   . Latex Hives  . Peach [Prunus  Persica] Hives  . Peanut-Containing Drug Products Hives and Swelling  . Penicillins Hives, Itching and Swelling  . Strawberry Hives  . Wheat     Shortness of breath  . Iodine Rash     REVIEW OF SYSTEMS:  GENERAL: no change in appetite, no fatigue, no weight changes, no fever, chills or weakness RESPIRATORY: no cough, SOB, DOE, wheezing, hemoptysis CARDIAC: no chest pain, edema or palpitations GI: no abdominal pain, diarrhea, constipation, heart burn, nausea or vomiting  PHYSICAL EXAMINATION  GENERAL: no acute distress, normal body habitus NECK: supple, trachea midline, no neck masses, no thyroid tenderness, no thyromegaly LYMPHATICS: no LAN in the neck, no supraclavicular LAN RESPIRATORY: breathing is even & unlabored, BS CTAB CARDIAC: RRR, no murmur,no extra heart sounds, no edema GI: abdomen soft, normal BS, no masses, no tenderness, no hepatomegaly, no splenomegaly EXTREMITIES:  Able to move all 4 extremities; right wrist with soft cast and right arm on sling; able to wiggle right fingers PSYCHIATRIC: the patient is alert & oriented to person, affect & behavior appropriate  LABS/RADIOLOGY: Labs reviewed: Basic Metabolic Panel:  Recent Labs  10/04/14 1403 10/23/14 1111 11/01/14 1133  NA 141 141 141  K 4.3 3.8 3.7  CL 106 107 105  CO2 27 28 27   GLUCOSE 89 83 77  BUN 19 16 16   CREATININE 1.0 0.9 0.87  CALCIUM 9.2 9.3 9.1   CBC:  Recent Labs  08/28/14 2015  09/23/14 0444 10/04/14 1403 11/01/14 1133  WBC 13.8*  < > 5.6 9.2 9.4  NEUTROABS 12.0*  --   --   --   --   HGB 12.4  < > 12.1 13.3 12.2  HCT 37.8  < > 37.4 41.8 37.9  MCV 88.3  < > 91.4 89.6 88.9  PLT 173  < > 158 364.0 242.0  < > = values in this interval not displayed.   ASSESSMENT/PLAN:  Right hip fracture status post prosthetic replacement - for outpatient rehabilitation; continue ASA 81 mg PO Q D and Norco 5/325 MG 1-2 tabs PO Q 6 hours PRN Right distal radius fracture status post ORIF - soft  cast with right arm on sling Hypertension - continue on atenolol 50 mg by mouth daily and diltiazem 180 mg by mouth daily Hyperlipidemia - continue Crestor 5 mg by mouth daily at bedtime  Constipation- stable; continue Mralax 17 gm PO Q D PRN Anxiety - stable; continue Xanax 0.25 mg PO TID PRN Seizure - well controlled; continue Keppra 250 mg by mouth twice a day Anemia, acute blood loss - stable; hemoglobin 70.1 Chronic diastolic CHF - well compensated   I have filled out patient's discharge paperwork and written prescriptions.  Patient will have outpatient rehabilitation.  DME provided: standard rolling walker with right platform bedside commode  Total discharge time: Greater than 30 minutes  Discharge time involved coordination of the discharge process with social worker, nursing staff and therapy department. Medical justification for home health services/DME verified    [facility], [place] [phone]

## 2014-11-19 DIAGNOSIS — S52531D Colles' fracture of right radius, subsequent encounter for closed fracture with routine healing: Secondary | ICD-10-CM | POA: Diagnosis not present

## 2014-11-19 DIAGNOSIS — S72001D Fracture of unspecified part of neck of right femur, subsequent encounter for closed fracture with routine healing: Secondary | ICD-10-CM | POA: Diagnosis not present

## 2014-11-20 DIAGNOSIS — M25451 Effusion, right hip: Secondary | ICD-10-CM | POA: Diagnosis not present

## 2014-11-20 DIAGNOSIS — M25651 Stiffness of right hip, not elsewhere classified: Secondary | ICD-10-CM | POA: Diagnosis not present

## 2014-11-20 DIAGNOSIS — M25639 Stiffness of unspecified wrist, not elsewhere classified: Secondary | ICD-10-CM | POA: Diagnosis not present

## 2014-11-20 DIAGNOSIS — M25431 Effusion, right wrist: Secondary | ICD-10-CM | POA: Diagnosis not present

## 2014-11-21 DIAGNOSIS — M25651 Stiffness of right hip, not elsewhere classified: Secondary | ICD-10-CM | POA: Diagnosis not present

## 2014-11-21 DIAGNOSIS — M25639 Stiffness of unspecified wrist, not elsewhere classified: Secondary | ICD-10-CM | POA: Diagnosis not present

## 2014-11-21 DIAGNOSIS — M25431 Effusion, right wrist: Secondary | ICD-10-CM | POA: Diagnosis not present

## 2014-11-21 DIAGNOSIS — M25451 Effusion, right hip: Secondary | ICD-10-CM | POA: Diagnosis not present

## 2014-11-25 ENCOUNTER — Encounter: Payer: Self-pay | Admitting: Adult Health

## 2014-11-25 DIAGNOSIS — M25431 Effusion, right wrist: Secondary | ICD-10-CM | POA: Diagnosis not present

## 2014-11-25 DIAGNOSIS — M25451 Effusion, right hip: Secondary | ICD-10-CM | POA: Diagnosis not present

## 2014-11-25 DIAGNOSIS — M25639 Stiffness of unspecified wrist, not elsewhere classified: Secondary | ICD-10-CM | POA: Diagnosis not present

## 2014-11-25 DIAGNOSIS — M25651 Stiffness of right hip, not elsewhere classified: Secondary | ICD-10-CM | POA: Diagnosis not present

## 2014-11-27 DIAGNOSIS — M25451 Effusion, right hip: Secondary | ICD-10-CM | POA: Diagnosis not present

## 2014-11-27 DIAGNOSIS — M25639 Stiffness of unspecified wrist, not elsewhere classified: Secondary | ICD-10-CM | POA: Diagnosis not present

## 2014-11-27 DIAGNOSIS — M25651 Stiffness of right hip, not elsewhere classified: Secondary | ICD-10-CM | POA: Diagnosis not present

## 2014-11-27 DIAGNOSIS — M25431 Effusion, right wrist: Secondary | ICD-10-CM | POA: Diagnosis not present

## 2014-11-28 DIAGNOSIS — M25431 Effusion, right wrist: Secondary | ICD-10-CM | POA: Diagnosis not present

## 2014-11-28 DIAGNOSIS — M25451 Effusion, right hip: Secondary | ICD-10-CM | POA: Diagnosis not present

## 2014-11-28 DIAGNOSIS — M25651 Stiffness of right hip, not elsewhere classified: Secondary | ICD-10-CM | POA: Diagnosis not present

## 2014-11-28 DIAGNOSIS — M25639 Stiffness of unspecified wrist, not elsewhere classified: Secondary | ICD-10-CM | POA: Diagnosis not present

## 2014-12-11 ENCOUNTER — Encounter: Payer: Self-pay | Admitting: Neurology

## 2014-12-11 ENCOUNTER — Ambulatory Visit (INDEPENDENT_AMBULATORY_CARE_PROVIDER_SITE_OTHER): Payer: Medicare Other | Admitting: Neurology

## 2014-12-11 VITALS — BP 148/67 | HR 62 | Ht 65.0 in | Wt 117.6 lb

## 2014-12-11 DIAGNOSIS — I471 Supraventricular tachycardia: Secondary | ICD-10-CM | POA: Diagnosis not present

## 2014-12-11 DIAGNOSIS — I951 Orthostatic hypotension: Secondary | ICD-10-CM | POA: Diagnosis not present

## 2014-12-11 DIAGNOSIS — I4819 Other persistent atrial fibrillation: Secondary | ICD-10-CM | POA: Insufficient documentation

## 2014-12-11 DIAGNOSIS — R569 Unspecified convulsions: Secondary | ICD-10-CM | POA: Diagnosis not present

## 2014-12-11 DIAGNOSIS — I70209 Unspecified atherosclerosis of native arteries of extremities, unspecified extremity: Secondary | ICD-10-CM | POA: Diagnosis not present

## 2014-12-11 DIAGNOSIS — G451 Carotid artery syndrome (hemispheric): Secondary | ICD-10-CM | POA: Diagnosis not present

## 2014-12-11 DIAGNOSIS — Z9889 Other specified postprocedural states: Secondary | ICD-10-CM | POA: Diagnosis not present

## 2014-12-11 DIAGNOSIS — I481 Persistent atrial fibrillation: Secondary | ICD-10-CM

## 2014-12-11 DIAGNOSIS — IMO0001 Reserved for inherently not codable concepts without codable children: Secondary | ICD-10-CM

## 2014-12-11 DIAGNOSIS — Z8679 Personal history of other diseases of the circulatory system: Secondary | ICD-10-CM | POA: Insufficient documentation

## 2014-12-11 DIAGNOSIS — R6889 Other general symptoms and signs: Secondary | ICD-10-CM

## 2014-12-11 NOTE — Progress Notes (Signed)
I agree with the assessment and plan -The patient is known to me .   Ovidio Steele, MD

## 2014-12-11 NOTE — Patient Instructions (Signed)
-   continue eliquis and crestor for stroke prevention - continue to follow up with Dr. Durene Cal for syncope vs. Seizure - Follow up with your primary care physician for stroke risk factor modification. Recommend maintain blood pressure goal <130/80, diabetes with hemoglobin A1c goal below 6.5% and lipids with LDL cholesterol goal below 70 mg/dL.  - continue Keppra for now - seizure precautions - avoid fall - check BP at home and record and bring over to PCP for medication adjustment.  - follow up as needed.

## 2014-12-11 NOTE — Progress Notes (Signed)
STROKE NEUROLOGY FOLLOW UP NOTE  NAME: Kayla Kent DOB: 09/12/1932  REASON FOR VISIT: stroke follow up HISTORY FROM: Patient and chart  Today we had the pleasure of seeing Kayla Kent in follow-up at our Neurology Clinic. Pt was accompanied by daughter.   History Summary 81 yo F with PMH of paroxysmal SVT s/p ablation, PVD with vascular claudication suffered a distal radius and hip fracture following a fall on August 28, 2014 with surgical hip replacement on 08/29/2014. She went to SNF after surgical repair and was subsequently discharged to her daughter's house. Patient was brought to ED on 09/21/2014 due to SOB and found to have questionable LLL PNA on CXR. VQ scan and LE doppler were negative for PE or DVT. on 09/23/2014 patient complained of transient left hand decreased sensation and weakness. MRI showed no acute stroke, and MRA showed no significant stenosis. Stroke workup negative, however she was found to have afib/aflutter and she was put on Eliquis 2.5 mg twice a day. Her Crestor was continued. After discharge she follow up with cardiology, found to have persistent atrial ablation, had AV node ablation on 11/05/14. Patient stated that she had no any symptoms of atrial pronation before and after ablation.  She also followed with Dr. Jacelyn Grip at [facility] for two episodes of decreased level of consciousness associated with abnormal feeling of motion and possible visual changes lasting seconds. An MRI brain showed right temporal lobe encephalomalacia versus arachnoid cyst. EEG was normal. Diagnostic is concerned for syncope vs seizure vs arrhythmia. She was put on Keppra 250 twice a day. Later, she was followed by Dr. Durene Cal here in [facility], also concerning for syncope, however her Keppra was continued until now. Patient had no more LOC spells reported.  She was found to have orthostatic hypotension, her atenolol was stopped and midodrine was started. She feels much better however  she did not check her blood pressure at home. Her blood pressure today in clinic was 148/67.  Interval History During the interval time, the patient has been doing well. No recurrent stroke like symptoms, no seizures, no loss of consciousness episode. Her blood pressure today is 148/67, patient did not check blood pressure at home. Still continues Eliquis and the Crestor without side effect. She is on midodrine low-dose.    REVIEW OF SYSTEMS: Full 14 system review of systems performed and notable only for those listed below and in HPI above, all others are negative:  Constitutional:   Cardiovascular:  Ear/Nose/Throat:   Skin:  Eyes:   Respiratory:   Gastroitestinal:  Diarrhea Genitourinary:  Hematology/Lymphatic:   Endocrine:  Musculoskeletal:   Allergy/Immunology:   Neurological:   Psychiatric:  Sleep:   The following represents the patient's updated allergies and side effects list: Allergies  Allergen Reactions  . Chocolate Hives  . Codeine Other (See Comments)    Patient states she acts crazy  . Diazepam Other (See Comments)    Patient states she acts crazy.  . Sulfonamide Derivatives Hives    nausea  . Fruit & Vegetable Daily [Nutritional Supplements] Hives and Swelling    peaches  . Iohexol      Code: HIVES, Desc: pt gets 13 hr pre-meds, Onset Date: 55732202   . Latex Hives  . Peach [Prunus Persica] Hives  . Peanut-Containing Drug Products Hives and Swelling  . Penicillins Hives, Itching and Swelling  . Strawberry Hives  . Wheat     Shortness of breath  . Iodine Rash  The neurologically relevant items on the patient's problem list were reviewed on today's visit.  Neurologic Examination  A problem focused neurological exam (12 or more points of the single system neurologic examination, vital signs counts as 1 point, cranial nerves count for 8 points) was performed.  Blood pressure 148/67, pulse 62, height 5\' 5"  (1.651 m), weight 117 lb 9.6 oz (53.343  kg).  General - Well nourished, well developed, in no apparent distress.  Ophthalmologic - Sharp disc margins OU.  Cardiovascular - irregularly irregular heart rate and rhythm.  Mental Status -  Level of arousal and orientation to time, place, and person were intact. Language including expression, naming, repetition, comprehension was assessed and found intact.  Cranial Nerves II - XII - II - Visual field intact OU, left eye congenital blindness. III, IV, VI - Extraocular movements intact on the right, left eye limited motion to the lateral movement, PERRL. V - Facial sensation intact bilaterally. VII - Facial movement intact bilaterally. VIII - Hearing & vestibular intact bilaterally. X - Palate elevates symmetrically. XI - Chin turning & shoulder shrug intact bilaterally. XII - Tongue protrusion intact.  Motor Strength - The patient's strength was normal in all extremities and pronator drift was absent.  Bulk was normal and fasciculations were absent.   Motor Tone - Muscle tone was assessed at the neck and appendages and was normal.  Reflexes - The patient's reflexes were normal in all extremities and she had no pathological reflexes.  Sensory - Light touch, temperature/pinprick, vibration and proprioception, and Romberg testing were assessed and were normal.    Coordination - The patient had normal movements in the hands and feet with no ataxia or dysmetria.  Tremor was absent.  Gait and Station - The patient's transfers, posture, gait, station, and turns were observed as normal.  Data reviewed: I personally reviewed the images and agree with the radiology interpretations.  MRI and MRA -  1. No acute intracranial abnormality. Mild progression of small vessel ischemia since 2013. 2. Stable intracranial MRA, with mild to moderate left PCA and left MCA stenoses with preserved distal flow.  2D Echocardiogram EF 55-60% with no source of embolus.   Carotid Doppler There is  1-39% bilateral ICA stenosis. Vertebral artery flow is antegrade.  Component     Latest Ref Rng 09/23/2014 09/24/2014  Cholesterol     0 - 200 mg/dL  141  Triglycerides     <150 mg/dL  89  HDL     >39 mg/dL  30 (L)  Total CHOL/HDL Ratio       4.7  VLDL     0 - 40 mg/dL  18  LDL (calc)     0 - 99 mg/dL  93  Hemoglobin A1C     <5.7 % 5.5   Mean Plasma Glucose     <117 mg/dL 111     Assessment: As you may recall, she is a 81 y.o. Caucasian female with PMH of syncope vs. Seizure following with Dr. Durene Cal on Demetra Shiner, orthostatic hypotension on midodrine, paroxysmal SVT s/p ablation, PVD with vascular claudication, distal radius and hip fracture s/p repair in 08/2014 was admitted on 09/21/14 for SOB but found to have transient left hand numbness and weakness on 09/23/14. MRI negative for acute stroke and stroke workup negative. However, she was found to have Afib/Aflutter, she was put on Eliquis 2.5 mg twice a day. Crestor continued. After discharge, she was followed by cardiology for persistent A. fib, had recent AV  node ablation in 10/2014. Patient has been doing well, no recurrent stroke like symptoms or seizure/LOC episode.  Plan:  - Continue Eliquis and Crestor for stroke prevention - Follow-up with Dr. Durene Cal for syncope vs seizure vs arrhythmia - Continue Keppra at current dose - Follow up with your primary care physician for stroke risk factor modification. Recommend maintain blood pressure goal <130/80, diabetes with hemoglobin A1c goal below 6.5% and lipids with LDL cholesterol goal below 70 mg/dL.  - Continue to follow up with cardiology and orthopedics - Seizure precautions, avoid fall - Check blood pressure at home and record and bring over to PCP for medication adjustment - RTC PRN  No orders of the defined types were placed in this encounter.    No orders of the defined types were placed in this encounter.    Patient Instructions  - continue eliquis and crestor for  stroke prevention - continue to follow up with Dr. Durene Cal for syncope vs. Seizure - Follow up with your primary care physician for stroke risk factor modification. Recommend maintain blood pressure goal <130/80, diabetes with hemoglobin A1c goal below 6.5% and lipids with LDL cholesterol goal below 70 mg/dL.  - continue Keppra for now - seizure precautions - avoid fall - check BP at home and record and bring over to PCP for medication adjustment.  - follow up as needed.   Rosalin Hawking, MD PhD [facility] [address] [place], [place] [address] [phone]

## 2014-12-18 ENCOUNTER — Other Ambulatory Visit: Payer: Self-pay | Admitting: Internal Medicine

## 2014-12-18 NOTE — Telephone Encounter (Signed)
Rx faxed to pharmacy  

## 2014-12-18 NOTE — Telephone Encounter (Signed)
Done hardcopy to cherina

## 2014-12-19 ENCOUNTER — Other Ambulatory Visit: Payer: Self-pay

## 2014-12-19 ENCOUNTER — Other Ambulatory Visit: Payer: Self-pay | Admitting: Internal Medicine

## 2015-01-03 ENCOUNTER — Encounter: Payer: Self-pay | Admitting: Internal Medicine

## 2015-01-03 ENCOUNTER — Ambulatory Visit (INDEPENDENT_AMBULATORY_CARE_PROVIDER_SITE_OTHER): Payer: Medicare Other | Admitting: Internal Medicine

## 2015-01-03 VITALS — BP 140/64 | HR 64 | Ht 65.0 in | Wt 115.8 lb

## 2015-01-03 DIAGNOSIS — I951 Orthostatic hypotension: Secondary | ICD-10-CM | POA: Diagnosis not present

## 2015-01-03 DIAGNOSIS — I70209 Unspecified atherosclerosis of native arteries of extremities, unspecified extremity: Secondary | ICD-10-CM

## 2015-01-03 MED ORDER — MIDODRINE HCL 2.5 MG PO TABS: ORAL_TABLET | ORAL | Status: DC

## 2015-01-03 MED ORDER — ROSUVASTATIN CALCIUM 5 MG PO TABS: 5.0000 mg | ORAL_TABLET | Freq: Every day | ORAL | Status: DC

## 2015-01-03 NOTE — Patient Instructions (Addendum)
Your physician has recommended you make the following change in your medication:  1.) MIDODRINE--TAKE 1 TABLET DAILY FOR ONE WEEK THEN STOP.  Your physician recommends that you schedule a follow-up appointment in: MAY 2016 [place].

## 2015-01-03 NOTE — Progress Notes (Signed)
Cardiology Office Note   Date:  01/03/2015   ID:  [name], DOB 05/30/1932, MRN [id number]  PCP:  Cathlean Cower, MD  Cardiologist:   Dorris Carnes, MD   Chief Complaint  Patient presents with  . Medication Refill      History of Present Illness: Kayla Kent is a 81 y.o. female with a history of HTN, H and atrial fibrillation I saw her before XMas  She underwent cardioversion in December  Since then she says her breathing is OK  No CP  No palpitations  She has not fallen  Since last fall         Current Outpatient Prescriptions  Medication Sig Dispense Refill  . acetaminophen (TYLENOL) 500 MG tablet Take 1 tablet (500 mg total) by mouth every 6 (six) hours as needed. 30 tablet 0  . ALPRAZolam (XANAX) 0.25 MG tablet TAKE ONE TABLET BY MOUTH THREE TIMES DAILY 180 tablet 1  . apixaban (ELIQUIS) 2.5 MG TABS tablet Take 1 tablet (2.5 mg total) by mouth 2 (two) times daily. 60 tablet 11  . diltiazem (DILACOR XR) 180 MG 24 hr capsule Take 180 mg by mouth daily.    Marland Kitchen levETIRAcetam (KEPPRA) 250 MG tablet Take 250 mg by mouth 2 (two) times daily.    . midodrine (PROAMATINE) 2.5 MG tablet 2 tabs by mouth in the AM, and 1 in PM 60 tablet 10  . rosuvastatin (CRESTOR) 5 MG tablet Take 5 mg by mouth at bedtime.    Marland Kitchen zolpidem (AMBIEN) 5 MG tablet Take 5 mg by mouth at bedtime as needed for sleep.      No current facility-administered medications for this visit.    Allergies:   Chocolate; Codeine; Diazepam; Fruit & vegetable daily; Iohexol; Latex; Peach; Peanut-containing drug products; Penicillins; Strawberry; Sulfonamide derivatives; Wheat; and Iodine   Past Medical History  Diagnosis Date  . Anxiety   . Paroxysmal supraventricular tachycardia   . Uterine prolapse without mention of vaginal wall prolapse   . HTN (hypertension)   . HLD (hyperlipidemia)   . Intolerance of drug     orthostatic  . PVD (peripheral vascular disease)   . Allergic rhinitis   . Syncope and  collapse     Past Surgical History  Procedure Laterality Date  . Corrective eye surgery      as a child  . Cholecystectomy    . Tonsillectomy    . Ivd removed    . Stress cardiolite  08/05/93  . Rf ablation psvt      summer '10  . Total hip arthroplasty Right 08/29/2014    Procedure: Right Hip Hemi Arthroplasty;  Surgeon: Marianna Payment, MD;  Location: [facility];  Service: Orthopedics;  Laterality: Right;  Hip procedure 1st wants Peg Board, [employer], Big Carm. Wrist - wants [employer].  Surgeon available at 1645.  Surgeon to inform sales reps.  Marland Kitchen Open reduction internal fixation (orif) distal radial fracture Right 08/29/2014    Procedure: OPEN REDUCTION INTERNAL FIXATION (ORIF) DISTAL RADIAL FRACTURE;  Surgeon: Marianna Payment, MD;  Location: [employer];  Service: Orthopedics;  Laterality: Right;  . Cardioversion N/A 11/05/2014    Procedure: CARDIOVERSION;  Surgeon: Candee Furbish, MD;  Location: [facility] ENDOSCOPY;  Service: Cardiovascular;  Laterality: N/A;     Social History:  The patient  reports that she quit smoking about 59 years ago. She has never used smokeless tobacco. She reports that she does not drink alcohol or use illicit  drugs.   Family History:  The patient's family history includes Arthritis in her father; Coronary artery disease in her brother; Heart attack in her brother; Heart disease in her brother; Hyperlipidemia in her sister; Hypertension in her brother, sister, and another family member; Mental illness in her father. There is no history of Colon cancer, Breast cancer, Diabetes, Stroke, Diabetes, or Diabetes.    ROS:  Please see the history of present illness. All other systems are reviewed and  Negative to the above problem except as noted.    PHYSICAL EXAM: VS:  BP 140/64 mmHg  Pulse 64  Ht 5\' 5"  (1.651 m)  Wt 115 lb 12.8 oz (52.527 kg)  BMI 19.27 kg/m2  GEN: Well nourished, well developed, in no acute distress HEENT: normal Neck: no JVD, carotid bruits, or  masses Cardiac:Irreg irreg  ; no murmurs, rubs, or gallops,no edema  Respiratory:  clear to auscultation bilaterally, normal work of breathing GI: soft, nontender, nondistended, + BS  No hepatomegaly  MS: no deformity Moving all extremities   Skin: warm and dry, no rash Neuro:  Strength and sensation are intact Psych: euthymic mood, full affect   EKG:  EKG is ordered today.  Atrial fib  74 bpm     Lipid Panel    Component Value Date/Time   CHOL 141 09/24/2014 0009   TRIG 89 09/24/2014 0009   TRIG 141 10/25/2006 0906   HDL 30* 09/24/2014 0009   CHOLHDL 4.7 09/24/2014 0009   CHOLHDL 7.9 CALC 10/25/2006 0906   VLDL 18 09/24/2014 0009   LDLCALC 93 09/24/2014 0009   LDLDIRECT 175.3 01/19/2008 1216   LDLDIRECT 152.0 10/25/2006 0906      Wt Readings from Last 3 Encounters:  01/03/15 115 lb 12.8 oz (52.527 kg)  12/11/14 117 lb 9.6 oz (53.343 kg)  10/23/14 116 lb 1.9 oz (52.672 kg)      ASSESSMENT AND PLAN: 1  Atrial fibrillation  Back in afib after cardioversion  She does not sense.  I would keep on current regimen  Stressed importance of watching / being careful not to fall  She or family will call me if she does  2.   Dizziness/ orthostasis  Patient's BP is high  I would cut back on midodrin to 1 x per day (she is currently taking 2.5 bid)  Go on one time per day for 1 wk then d/c  Follow up in may Stay hydrated  3.  HL  Patient is currently on 5 of crestor  Complains of cost  She had been on lipitor in past  Thinks she had a reaction to it  Will need to review  No changes for now.     Current medicines are reviewed at length with the patient today.  The patient does not have concerns regarding medicines.  The following changes have been made: cut back thwen stop midodrine    Labs/ tests ordered today include:  None   No orders of the defined types were placed in this encounter.     Disposition:   FU with me in may    Signed, Dorris Carnes, MD  01/03/2015 12:03 PM      [place], [place], [place]  [address] Phone: [phone]; Fax: [phone]

## 2015-01-30 ENCOUNTER — Other Ambulatory Visit: Payer: Self-pay | Admitting: Internal Medicine

## 2015-02-03 ENCOUNTER — Other Ambulatory Visit: Payer: Self-pay | Admitting: Internal Medicine

## 2015-02-18 DIAGNOSIS — S72001D Fracture of unspecified part of neck of right femur, subsequent encounter for closed fracture with routine healing: Secondary | ICD-10-CM | POA: Diagnosis not present

## 2015-02-18 DIAGNOSIS — S52531D Colles' fracture of right radius, subsequent encounter for closed fracture with routine healing: Secondary | ICD-10-CM | POA: Diagnosis not present

## 2015-03-03 ENCOUNTER — Telehealth: Payer: Self-pay | Admitting: Internal Medicine

## 2015-03-03 NOTE — Telephone Encounter (Signed)
[place] [facility] - Client [facility] Patient Name: Kayla Kent Gender: Female DOB: 02-16-1932 Age: 81 Y 54 D Return Phone Number: [phone] (Primary), [phone] (Secondary) Address: [place] City/State/Zip: [place] [address] Client [facility] - Client Client Site [place] - Day Physician John, [place] Type Call Call Type Triage / Clinical Relationship To Patient Self Appointment Disposition EMR Caller Not Reached Info pasted into Epic Yes Return Phone Number [phone] (Secondary) Chief Complaint Health information question (non symptomatic) Initial Comment Caller states, She had a hip opperation and dx with AFIB 6 months ago, Then last night she woke up with a bad earache and sore throat, which have passed. But she is concered about possibe infections. The next available appt is 515 tomorrow. Nurse Assessment Guidelines Guideline Title Affirmed Question Affirmed Notes Nurse Date/Time (Eastern Time) Disp. Time Eilene Ghazi Time) Disposition Final User 03/03/2015 9:37:58 AM Attempt made - line busy Wynetta Emery, RNBaker [place] 03/03/2015 10:34:55 AM Send To Clinical Follow Up Roselind Messier, RN, Estill Bamberg 03/03/2015 10:37:39 AM Attempt made - message left [place], RN, Amy 03/03/2015 11:03:16 AM FINAL ATTEMPT MADE - message left Yes Mechele Dawley, RN, Amy After Care Instructions Given Call Event Type User Date / Time Description

## 2015-03-04 ENCOUNTER — Encounter: Payer: Self-pay | Admitting: Internal Medicine

## 2015-03-04 ENCOUNTER — Ambulatory Visit (INDEPENDENT_AMBULATORY_CARE_PROVIDER_SITE_OTHER): Payer: Medicare Other | Admitting: Internal Medicine

## 2015-03-04 VITALS — BP 114/64 | HR 72 | Temp 97.3°F | Wt 111.1 lb

## 2015-03-04 DIAGNOSIS — J019 Acute sinusitis, unspecified: Secondary | ICD-10-CM

## 2015-03-04 DIAGNOSIS — I10 Essential (primary) hypertension: Secondary | ICD-10-CM

## 2015-03-04 DIAGNOSIS — I70209 Unspecified atherosclerosis of native arteries of extremities, unspecified extremity: Secondary | ICD-10-CM | POA: Diagnosis not present

## 2015-03-04 DIAGNOSIS — F329 Major depressive disorder, single episode, unspecified: Secondary | ICD-10-CM

## 2015-03-04 DIAGNOSIS — F32A Depression, unspecified: Secondary | ICD-10-CM

## 2015-03-04 MED ORDER — LEVOFLOXACIN 250 MG PO TABS: 250.0000 mg | ORAL_TABLET | Freq: Every day | ORAL | Status: DC

## 2015-03-04 NOTE — Patient Instructions (Signed)
Please take all new medication as prescribed - the antibiotic  You can also take Delsym OTC for cough, and/or Mucinex (or it's generic off brand) for congestion, and tylenol as needed for pain.  Please continue all other medications as before, and refills have been done if requested.  Please have the pharmacy call with any other refills you may need.  Please continue your efforts at being more active, low cholesterol diet, and weight control.  Please keep your appointments with your specialists as you may have planned

## 2015-03-04 NOTE — Progress Notes (Signed)
Subjective:    Patient ID: Kayla Kent, female    DOB: 11/12/1931, 81 y.o.   MRN: [id number]  HPI   Here with 2-3 days acute onset fever, facial pain, pressure, headache, general weakness and malaise, and greenish d/c, with mild ST and cough, but pt denies chest pain, wheezing, increased sob or doe, orthopnea, PND, increased LE swelling, palpitations, dizziness or syncope.   Pt denies polydipsia, polyuria, No recent similar symptoms or need for recent imaging. Denies worsening depressive symptoms, suicidal ideation, or panic, no SI or HI Past Medical History  Diagnosis Date  . Anxiety   . Paroxysmal supraventricular tachycardia   . Uterine prolapse without mention of vaginal wall prolapse   . HTN (hypertension)   . HLD (hyperlipidemia)   . Intolerance of drug     orthostatic  . PVD (peripheral vascular disease)   . Allergic rhinitis   . Syncope and collapse    Past Surgical History  Procedure Laterality Date  . Corrective eye surgery      as a child  . Cholecystectomy    . Tonsillectomy    . Ivd removed    . Stress cardiolite  08/05/93  . Rf ablation psvt      summer '10  . Total hip arthroplasty Right 08/29/2014    Procedure: Right Hip Hemi Arthroplasty;  Surgeon: Marianna Payment, MD;  Location: [facility];  Service: Orthopedics;  Laterality: Right;  Hip procedure 1st wants Peg Board, [employer], [employer]. Wrist - wants [employer].  Surgeon available at 1645.  Surgeon to inform sales reps.  Marland Kitchen Open reduction internal fixation (orif) distal radial fracture Right 08/29/2014    Procedure: OPEN REDUCTION INTERNAL FIXATION (ORIF) DISTAL RADIAL FRACTURE;  Surgeon: Marianna Payment, MD;  Location: [facility];  Service: Orthopedics;  Laterality: Right;  . Cardioversion N/A 11/05/2014    Procedure: CARDIOVERSION;  Surgeon: Candee Furbish, MD;  Location: [facility]OSCOPY;  Service: Cardiovascular;  Laterality: N/A;    reports that she quit smoking about 59 years ago. She has never used smokeless  tobacco. She reports that she does not drink alcohol or use illicit drugs. family history includes Arthritis in her father; Coronary artery disease in her brother; Heart attack in her brother; Heart disease in her brother; Hyperlipidemia in her sister; Hypertension in her brother, sister, and another family member; Mental illness in her father. There is no history of Colon cancer, Breast cancer, Diabetes, Stroke, Diabetes, or Diabetes. Allergies  Allergen Reactions  . Chocolate Hives  . Codeine Other (See Comments)    Patient states she acts crazy  . Diazepam Other (See Comments)    Patient states she acts crazy.  . Fruit & Vegetable Daily [Nutritional Supplements] Hives and Swelling    peaches  . Iohexol Hives     Code: HIVES, Desc: pt gets 13 hr pre-meds, Onset Date: 50539767   . Latex Hives  . Peach [Prunus Persica] Hives  . Peanut-Containing Drug Products Hives and Swelling  . Penicillins Hives, Itching and Swelling  . Strawberry Hives  . Sulfonamide Derivatives Hives    nausea  . Wheat Shortness Of Breath    Shortness of breath  . Iodine Rash   Current Outpatient Prescriptions on File Prior to Visit  Medication Sig Dispense Refill  . acetaminophen (TYLENOL) 500 MG tablet Take 1 tablet (500 mg total) by mouth every 6 (six) hours as needed. 30 tablet 0  . ALPRAZolam (XANAX) 0.25 MG tablet TAKE ONE TABLET BY  MOUTH THREE TIMES DAILY 180 tablet 1  . apixaban (ELIQUIS) 2.5 MG TABS tablet Take 1 tablet (2.5 mg total) by mouth 2 (two) times daily. 60 tablet 11  . diltiazem (DILACOR XR) 180 MG 24 hr capsule TAKE ONE CAPSULE BY MOUTH ONCE DAILY 90 capsule 2  . levETIRAcetam (KEPPRA) 250 MG tablet Take 250 mg by mouth 2 (two) times daily.    . midodrine (PROAMATINE) 2.5 MG tablet 1 TABLET BY MOUTH DAILY FOR ONE WEEK THEN STOP 60 tablet 10  . rosuvastatin (CRESTOR) 5 MG tablet Take 1 tablet (5 mg total) by mouth at bedtime.    Marland Kitchen zolpidem (AMBIEN) 5 MG tablet Take 5 mg by mouth at bedtime  as needed for sleep.     Marland Kitchen diltiazem (DILACOR XR) 180 MG 24 hr capsule Take 180 mg by mouth daily.     No current facility-administered medications on file prior to visit.   Review of Systems  Constitutional: Negative for unusual diaphoresis or night sweats HENT: Negative for ringing in ear or discharge Eyes: Negative for double vision or worsening visual disturbance.  Respiratory: Negative for choking and stridor.   Gastrointestinal: Negative for vomiting or other signifcant bowel change Genitourinary: Negative for hematuria or change in urine volume.  Musculoskeletal: Negative for other MSK pain or swelling Skin: Negative for color change and worsening wound.  Neurological: Negative for tremors and numbness other than noted  Psychiatric/Behavioral: Negative for decreased concentration or agitation other than above       Objective:   Physical Exam BP 114/64 mmHg  Pulse 72  Temp(Src) 97.3 F (36.3 C) (Oral)  Wt 111 lb 1.3 oz (50.386 kg)  SpO2 98% VS noted,  Constitutional: Pt appears in no significant distress HENT: Head: NCAT.  Right Ear: External ear normal.  Left Ear: External ear normal.  Eyes: . Pupils are equal, round, and reactive to light. Conjunctivae and EOM are normal Bilat tm's with mild erythema.  Max sinus areas mild tender.  Pharynx with mild erythema, no exudate Neck: Normal range of motion. Neck supple.  Cardiovascular: Normal rate and regular rhythm.   Pulmonary/Chest: Effort normal and breath sounds without rales or wheezing.  Neurological: Pt is alert. Not confused , motor grossly intact, has chronic right exotropia Skin: Skin is warm. No rash, no LE edema Psychiatric: Pt behavior is normal. No agitation. Not depressed affect      Assessment & Plan:

## 2015-03-04 NOTE — Progress Notes (Signed)
Pre visit review using our clinic review tool, if applicable. No additional management support is needed unless otherwise documented below in the visit note. 

## 2015-03-05 ENCOUNTER — Telehealth: Payer: Self-pay | Admitting: *Deleted

## 2015-03-05 NOTE — Telephone Encounter (Signed)
[facility] - Client [facility] Patient Name: Kayla Kent Gender: Female DOB: Jul 20, 1932 Age: 81 Y 16 D Return Phone Number: [phone] (Primary), [phone] (Secondary) Address: [place] City/State/Zip: [place] [address] Client [facility] - Client Client Site [place] - Day Physician Cathlean Cower Contact Type Call Call Type Triage / Clinical Relationship To Patient Self Appointment Disposition EMR Caller Not Reached Info pasted into Epic Yes Return Phone Number [phone] (Secondary) Chief Complaint Health information question (non symptomatic) Initial Comment Caller states, She had a hip opperation and dx with AFIB 6 months ago, Then last night she woke up with a bad earache and sore throat, which have passed. But she is concered about possibe infections. The next available appt is 515 tomorrow. Nurse Assessment Guidelines Guideline Title Affirmed Question Affirmed Notes Nurse Date/Time (Eastern Time) Disp. Time Eilene Ghazi Time) Disposition Final User 03/03/2015 9:37:58 AM Attempt made - line busy Wynetta Emery, RNBaker Janus 03/03/2015 10:34:55 AM Send To Clinical Follow Up Roselind Messier, RN, Estill Bamberg 03/03/2015 10:37:39 AM Attempt made - message left [place], RN, Amy 03/03/2015 11:03:16 AM FINAL ATTEMPT MADE - message left Yes Mechele Dawley, RN, Amy After Care Instructions Given Call Event Type User Date / Time Description

## 2015-03-05 NOTE — Assessment & Plan Note (Signed)
Mild to mod, for antibx course,  to f/u any worsening symptoms or concerns 

## 2015-03-05 NOTE — Assessment & Plan Note (Addendum)
stable overall by history and exam, and pt to continue medical treatment as before,  to f/u any worsening symptoms or concerns 

## 2015-03-05 NOTE — Assessment & Plan Note (Signed)
stable overall by history and exam, recent data reviewed with pt, and pt to continue medical treatment as before,  to f/u any worsening symptoms or concerns BP Readings from Last 3 Encounters:  03/04/15 114/64  01/03/15 140/64  12/11/14 148/67

## 2015-03-13 ENCOUNTER — Ambulatory Visit (INDEPENDENT_AMBULATORY_CARE_PROVIDER_SITE_OTHER): Payer: Medicare Other | Admitting: Internal Medicine

## 2015-03-13 ENCOUNTER — Encounter: Payer: Self-pay | Admitting: Internal Medicine

## 2015-03-13 VITALS — BP 160/60 | HR 79 | Ht 65.0 in | Wt 112.1 lb

## 2015-03-13 DIAGNOSIS — I70209 Unspecified atherosclerosis of native arteries of extremities, unspecified extremity: Secondary | ICD-10-CM | POA: Diagnosis not present

## 2015-03-13 DIAGNOSIS — I471 Supraventricular tachycardia: Secondary | ICD-10-CM

## 2015-03-13 LAB — CBC WITH DIFFERENTIAL/PLATELET
Basophils Absolute: 0 10*3/uL (ref 0.0–0.1)
Basophils Relative: 0.3 % (ref 0.0–3.0)
EOS PCT: 1.2 % (ref 0.0–5.0)
Eosinophils Absolute: 0.1 10*3/uL (ref 0.0–0.7)
HCT: 44.4 % (ref 36.0–46.0)
Hemoglobin: 14.7 g/dL (ref 12.0–15.0)
LYMPHS PCT: 30.7 % (ref 12.0–46.0)
Lymphs Abs: 2.7 10*3/uL (ref 0.7–4.0)
MCHC: 33 g/dL (ref 30.0–36.0)
MCV: 88.4 fl (ref 78.0–100.0)
Monocytes Absolute: 0.6 10*3/uL (ref 0.1–1.0)
Monocytes Relative: 7 % (ref 3.0–12.0)
NEUTROS PCT: 60.8 % (ref 43.0–77.0)
Neutro Abs: 5.4 10*3/uL (ref 1.4–7.7)
PLATELETS: 226 10*3/uL (ref 150.0–400.0)
RBC: 5.03 Mil/uL (ref 3.87–5.11)
RDW: 15 % (ref 11.5–15.5)
WBC: 8.9 10*3/uL (ref 4.0–10.5)

## 2015-03-13 LAB — BASIC METABOLIC PANEL
BUN: 25 mg/dL — AB (ref 6–23)
CO2: 28 mEq/L (ref 19–32)
CREATININE: 0.99 mg/dL (ref 0.40–1.20)
Calcium: 9.4 mg/dL (ref 8.4–10.5)
Chloride: 105 mEq/L (ref 96–112)
GFR: 57.21 mL/min — ABNORMAL LOW (ref >60.00)
GLUCOSE: 75 mg/dL (ref 70–99)
POTASSIUM: 4.6 meq/L (ref 3.5–5.1)
Sodium: 139 mEq/L (ref 135–145)

## 2015-03-13 MED ORDER — ROSUVASTATIN CALCIUM 5 MG PO TABS: 5.0000 mg | ORAL_TABLET | Freq: Every day | ORAL | Status: DC

## 2015-03-13 NOTE — Progress Notes (Signed)
Cardiology Office Note   Date:  03/13/2015   ID:  Kayla Kent, DOB 09/15/1932, MRN [id number]  PCP:  Cathlean Cower, MD  Cardiologist:   Dorris Carnes, MD   No chief complaint on file.  F/U of atrial fib     History of Present Illness: Kayla Kent is a 81 y.o. female with a history of atriial fib and HTN   I saw her back in March Since then she has done well  Denies palpitations.  No dizziness  No SOB  No CP     Current Outpatient Prescriptions  Medication Sig Dispense Refill  . acetaminophen (TYLENOL) 500 MG tablet Take 1 tablet (500 mg total) by mouth every 6 (six) hours as needed. 30 tablet 0  . ALPRAZolam (XANAX) 0.25 MG tablet TAKE ONE TABLET BY MOUTH THREE TIMES DAILY 180 tablet 1  . apixaban (ELIQUIS) 2.5 MG TABS tablet Take 1 tablet (2.5 mg total) by mouth 2 (two) times daily. 60 tablet 11  . diltiazem (DILACOR XR) 180 MG 24 hr capsule TAKE ONE CAPSULE BY MOUTH ONCE DAILY 90 capsule 2  . levETIRAcetam (KEPPRA) 250 MG tablet Take 250 mg by mouth 2 (two) times daily.    Marland Kitchen levofloxacin (LEVAQUIN) 250 MG tablet Take 1 tablet (250 mg total) by mouth daily. 10 tablet 0  . midodrine (PROAMATINE) 2.5 MG tablet 1 TABLET BY MOUTH DAILY FOR ONE WEEK THEN STOP 60 tablet 10  . rosuvastatin (CRESTOR) 5 MG tablet Take 1 tablet (5 mg total) by mouth at bedtime.    Marland Kitchen zolpidem (AMBIEN) 5 MG tablet Take 5 mg by mouth at bedtime as needed for sleep.      No current facility-administered medications for this visit.    Allergies:   Chocolate; Codeine; Diazepam; Fruit & vegetable daily; Iohexol; Latex; Peach; Peanut-containing drug products; Penicillins; Strawberry; Sulfonamide derivatives; Wheat; and Iodine   Past Medical History  Diagnosis Date  . Anxiety   . Paroxysmal supraventricular tachycardia   . Uterine prolapse without mention of vaginal wall prolapse   . HTN (hypertension)   . HLD (hyperlipidemia)   . Intolerance of drug     orthostatic  . PVD (peripheral vascular disease)    . Allergic rhinitis   . Syncope and collapse     Past Surgical History  Procedure Laterality Date  . Corrective eye surgery      as a child  . Cholecystectomy    . Tonsillectomy    . Ivd removed    . Stress cardiolite  08/05/93  . Rf ablation psvt      summer '10  . Total hip arthroplasty Right 08/29/2014    Procedure: Right Hip Hemi Arthroplasty;  Surgeon: Marianna Payment, MD;  Location: [place];  Service: Orthopedics;  Laterality: Right;  Hip procedure 1st wants Peg Board, [employer], Big [employer]. Wrist - wants [employer].  Surgeon available at 1645.  Surgeon to inform sales reps.  Marland Kitchen Open reduction internal fixation (orif) distal radial fracture Right 08/29/2014    Procedure: OPEN REDUCTION INTERNAL FIXATION (ORIF) DISTAL RADIAL FRACTURE;  Surgeon: Marianna Payment, MD;  Location: [place];  Service: Orthopedics;  Laterality: Right;  . Cardioversion N/A 11/05/2014    Procedure: CARDIOVERSION;  Surgeon: Candee Furbish, MD;  Location: [facility];  Service: Cardiovascular;  Laterality: N/A;     Social History:  The patient  reports that she quit smoking about 59 years ago. She has never used smokeless tobacco. She reports that  she does not drink alcohol or use illicit drugs.   Family History:  The patient's family history includes Arthritis in her father; Coronary artery disease in her brother; Heart attack in her brother; Heart disease in her brother; Hyperlipidemia in her sister; Hypertension in her brother, sister, and another family member; Mental illness in her father. There is no history of Colon cancer, Breast cancer, Diabetes, Stroke, Diabetes, or Diabetes.    ROS:  Please see the history of present illness. All other systems are reviewed and  Negative to the above problem except as noted.    PHYSICAL EXAM: VS:  BP 160/60 mmHg  Pulse 79  Ht '5\' 5"'$  (1.651 m)  Wt 112 lb 1.9 oz (50.857 kg)  BMI 18.66 kg/m2  GEN: Well nourished, well developed, in no acute distress HEENT:  normal Neck: no JVD, carotid bruits, or masses Cardiac:  Irreg irreg; no murmurs, rubs, or gallops,no edema  Respiratory:  clear to auscultation bilaterally, normal work of breathing GI: soft, nontender, nondistended, + BS  No hepatomegaly  MS: no deformity Moving all extremities   Skin: warm and dry, no rash Neuro:  Strength and sensation are intact Psych: euthymic mood, full affect   EKG:  EKG is ordered today.  Atrial fib 63 bpm     Lipid Panel    Component Value Date/Time   CHOL 141 09/24/2014 0009   TRIG 89 09/24/2014 0009   TRIG 141 10/25/2006 0906   HDL 30* 09/24/2014 0009   CHOLHDL 4.7 09/24/2014 0009   CHOLHDL 7.9 CALC 10/25/2006 0906   VLDL 18 09/24/2014 0009   LDLCALC 93 09/24/2014 0009   LDLDIRECT 175.3 01/19/2008 1216   LDLDIRECT 152.0 10/25/2006 0906      Wt Readings from Last 3 Encounters:  03/13/15 112 lb 1.9 oz (50.857 kg)  03/04/15 111 lb 1.3 oz (50.386 kg)  01/03/15 115 lb 12.8 oz (52.527 kg)      ASSESSMENT AND PLAN: 1  Atrial fib  Back in afib  She is rel symptom free  Continue on current regimen  Check labs   2  Dizziness  Resolved  Off of midodrine      Current medicines are reviewed at length with the patient today.  The patient does not have concerns regarding medicines.  The following changes have been made: None  Labs/ tests ordered today include:  BMET and CBC No orders of the defined types were placed in this encounter.     Disposition:   FU with me in Nov/Dec  Signed, Dorris Carnes, MD  03/13/2015 12:12 PM    [address], [place], [place]  [address] Phone: [phone]; Fax: [phone]

## 2015-03-13 NOTE — Patient Instructions (Addendum)
Medication Instructions:  Crestor ( 5 mg ) daily sent in to [employer] [employer] [facility] store  Labwork: Cbc/bmet  Testing/Procedures: none  Follow-Up: Your physician wants you to follow-up in: 6 months. You will receive a reminder letter in the mail two months in advance. If you don't receive a letter, please call our office to schedule the follow-up appointment.   Any Other Special Instructions Will Be Listed Below (If Applicable).

## 2015-03-14 ENCOUNTER — Ambulatory Visit: Payer: Medicare Other | Admitting: Internal Medicine

## 2015-03-14 ENCOUNTER — Other Ambulatory Visit: Payer: Self-pay | Admitting: Internal Medicine

## 2015-04-01 ENCOUNTER — Ambulatory Visit: Payer: BLUE CROSS/BLUE SHIELD | Admitting: Adult Health

## 2015-04-22 ENCOUNTER — Encounter: Payer: Self-pay | Admitting: Internal Medicine

## 2015-04-22 ENCOUNTER — Ambulatory Visit (INDEPENDENT_AMBULATORY_CARE_PROVIDER_SITE_OTHER): Payer: Medicare Other | Admitting: Internal Medicine

## 2015-04-22 VITALS — BP 124/68 | HR 60 | Temp 97.8°F | Ht 65.0 in | Wt 116.0 lb

## 2015-04-22 DIAGNOSIS — I10 Essential (primary) hypertension: Secondary | ICD-10-CM | POA: Diagnosis not present

## 2015-04-22 DIAGNOSIS — F329 Major depressive disorder, single episode, unspecified: Secondary | ICD-10-CM | POA: Diagnosis not present

## 2015-04-22 DIAGNOSIS — E785 Hyperlipidemia, unspecified: Secondary | ICD-10-CM

## 2015-04-22 DIAGNOSIS — I70209 Unspecified atherosclerosis of native arteries of extremities, unspecified extremity: Secondary | ICD-10-CM

## 2015-04-22 DIAGNOSIS — F32A Depression, unspecified: Secondary | ICD-10-CM

## 2015-04-22 MED ORDER — DILTIAZEM HCL ER 180 MG PO CP24: 180.0000 mg | ORAL_CAPSULE | Freq: Every day | ORAL | Status: DC

## 2015-04-22 NOTE — Assessment & Plan Note (Signed)
stable overall by history and exam, recent data reviewed with pt, and pt to continue medical treatment as before,  to f/u any worsening symptoms or concerns Lab Results  Component Value Date   LDLCALC 93 09/24/2014   Declines f/u today

## 2015-04-22 NOTE — Progress Notes (Signed)
Subjective:    Patient ID: Kayla Kent, female    DOB: [birth year], 81 y.o.   MRN: [id number]  HPI   Here to f/u; overall doing ok,  Pt denies chest pain, increasing sob or doe, wheezing, orthopnea, PND, increased LE swelling, palpitations, dizziness or syncope.  Pt denies new neurological symptoms such as new headache, or facial or extremity weakness or numbness.  Pt denies polydipsia, polyuria, or low sugar episode.   Pt denies new neurological symptoms such as new headache, or facial or extremity weakness or numbness.   Pt states overall good compliance with meds, mostly trying to follow appropriate diet, with wt overall stable,  but little exercise however. Denies worsening depressive symptoms, suicidal ideation, or panic  No current complaints Past Medical History  Diagnosis Date  . Anxiety   . Paroxysmal supraventricular tachycardia   . Uterine prolapse without mention of vaginal wall prolapse   . HTN (hypertension)   . HLD (hyperlipidemia)   . Intolerance of drug     orthostatic  . PVD (peripheral vascular disease)   . Allergic rhinitis   . Syncope and collapse    Past Surgical History  Procedure Laterality Date  . Corrective eye surgery      as a child  . Cholecystectomy    . Tonsillectomy    . Ivd removed    . Stress cardiolite  08/05/93  . Rf ablation psvt      summer '10  . Total hip arthroplasty Right 08/29/2014    Procedure: Right Hip Hemi Arthroplasty;  Surgeon: Marianna Payment, MD;  Location: [place];  Service: Orthopedics;  Laterality: Right;  Hip procedure 1st wants Peg Board, [employer], [employer]. Wrist - wants [employer].  Surgeon available at 1645.  Surgeon to inform sales reps.  Marland Kitchen Open reduction internal fixation (orif) distal radial fracture Right 08/29/2014    Procedure: OPEN REDUCTION INTERNAL FIXATION (ORIF) DISTAL RADIAL FRACTURE;  Surgeon: Marianna Payment, MD;  Location: [place];  Service: Orthopedics;  Laterality: Right;  . Cardioversion N/A  11/05/2014    Procedure: CARDIOVERSION;  Surgeon: Candee Furbish, MD;  Location: [facility] ENDOSCOPY;  Service: Cardiovascular;  Laterality: N/A;    reports that she quit smoking about 59 years ago. She has never used smokeless tobacco. She reports that she does not drink alcohol or use illicit drugs. family history includes Arthritis in her father; Coronary artery disease in her brother; Heart attack in her brother; Heart disease in her brother; Hyperlipidemia in her sister; Hypertension in her brother, sister, and another family member; Mental illness in her father. There is no history of Colon cancer, Breast cancer, Diabetes, Stroke, Diabetes, or Diabetes. Allergies  Allergen Reactions  . Chocolate Hives  . Codeine Other (See Comments)    Patient states she acts crazy  . Diazepam Other (See Comments)    Patient states she acts crazy.  . Fruit & Vegetable Daily [Nutritional Supplements] Hives and Swelling    peaches  . Iohexol Hives     Code: HIVES, Desc: pt gets 13 hr pre-meds, Onset Date: [id number]   . Latex Hives  . Peach [Prunus Persica] Hives  . Peanut-Containing Drug Products Hives and Swelling  . Penicillins Hives, Itching and Swelling  . Strawberry Hives  . Sulfonamide Derivatives Hives    nausea  . Wheat Shortness Of Breath    Shortness of breath  . Iodine Rash   Current Outpatient Prescriptions on File Prior to Visit  Medication Sig Dispense  Refill  . acetaminophen (TYLENOL) 500 MG tablet Take 1 tablet (500 mg total) by mouth every 6 (six) hours as needed. 30 tablet 0  . ALPRAZolam (XANAX) 0.25 MG tablet TAKE ONE TABLET BY MOUTH THREE TIMES DAILY 180 tablet 1  . apixaban (ELIQUIS) 2.5 MG TABS tablet Take 1 tablet (2.5 mg total) by mouth 2 (two) times daily. 60 tablet 11  . CRESTOR 5 MG tablet TAKE ONE TABLET BY MOUTH AT BEDTIME 90 tablet 1  . levETIRAcetam (KEPPRA) 250 MG tablet Take 250 mg by mouth 2 (two) times daily.    . midodrine (PROAMATINE) 2.5 MG tablet 1 TABLET BY MOUTH  DAILY FOR ONE WEEK THEN STOP 60 tablet 10  . rosuvastatin (CRESTOR) 5 MG tablet Take 1 tablet (5 mg total) by mouth at bedtime. 30 tablet 9  . zolpidem (AMBIEN) 5 MG tablet Take 5 mg by mouth at bedtime as needed for sleep.     Marland Kitchen levofloxacin (LEVAQUIN) 250 MG tablet Take 1 tablet (250 mg total) by mouth daily. (Patient not taking: Reported on 04/22/2015) 10 tablet 0   No current facility-administered medications on file prior to visit.    Review of Systems  Constitutional: Negative for unusual diaphoresis or night sweats HENT: Negative for ringing in ear or discharge Eyes: Negative for double vision or worsening visual disturbance.  Respiratory: Negative for choking and stridor.   Gastrointestinal: Negative for vomiting or other signifcant bowel change Genitourinary: Negative for hematuria or change in urine volume.  Musculoskeletal: Negative for other MSK pain or swelling Skin: Negative for color change and worsening wound.  Neurological: Negative for tremors and numbness other than noted  Psychiatric/Behavioral: Negative for decreased concentration or agitation other than above       Objective:   Physical Exam BP 124/68 mmHg  Pulse 60  Temp(Src) 97.8 F (36.6 C) (Oral)  Ht '5\' 5"'$  (1.651 m)  Wt 116 lb (52.617 kg)  BMI 19.30 kg/m2  SpO2 98% N/A  VS noted,  Constitutional: Pt appears in no significant distress HENT: Head: NCAT.  Right Ear: External ear normal.  Left Ear: External ear normal.  Eyes: . Pupils are equal, round, and reactive to light. Conjunctivae and EOM are normal Neck: Normal range of motion. Neck supple.  Cardiovascular: Normal rate and regular rhythm.   Pulmonary/Chest: Effort normal and breath sounds without rales or wheezing.  Abd:  Soft, NT, ND, + BS Neurological: Pt is alert. Not confused , motor grossly intact Skin: Skin is warm. No rash, no LE edema Psychiatric: Pt behavior is normal. No agitation. not depressed affect     Assessment & Plan:

## 2015-04-22 NOTE — Assessment & Plan Note (Signed)
stable overall by history and exam, recent data reviewed with pt, and pt to continue medical treatment as before,  to f/u any worsening symptoms or concerns BP Readings from Last 3 Encounters:  04/22/15 124/68  03/13/15 160/60  03/04/15 114/64

## 2015-04-22 NOTE — Progress Notes (Signed)
Pre visit review using our clinic review tool, if applicable. No additional management support is needed unless otherwise documented below in the visit note. 

## 2015-04-22 NOTE — Assessment & Plan Note (Signed)
stable overall by history and exam, and pt to continue medical treatment as before,  to f/u any worsening symptoms or concerns 

## 2015-04-22 NOTE — Patient Instructions (Signed)
Please continue all other medications as before, and refills have been done if requested.  Please have the pharmacy call with any other refills you may need.  Please continue your efforts at being more active, low cholesterol diet, and weight control.  You are otherwise up to date with prevention measures today.  Please keep your appointments with your specialists as you may have planned  Please return in 6 months, or sooner if needed 

## 2015-04-29 ENCOUNTER — Ambulatory Visit: Payer: Medicare Other | Admitting: Internal Medicine

## 2015-05-06 ENCOUNTER — Ambulatory Visit (INDEPENDENT_AMBULATORY_CARE_PROVIDER_SITE_OTHER): Payer: Medicare Other | Admitting: Adult Health

## 2015-05-06 ENCOUNTER — Encounter: Payer: Self-pay | Admitting: Adult Health

## 2015-05-06 VITALS — BP 147/73 | HR 76 | Ht 64.0 in | Wt 115.0 lb

## 2015-05-06 DIAGNOSIS — I70209 Unspecified atherosclerosis of native arteries of extremities, unspecified extremity: Secondary | ICD-10-CM | POA: Diagnosis not present

## 2015-05-06 DIAGNOSIS — R569 Unspecified convulsions: Secondary | ICD-10-CM | POA: Diagnosis not present

## 2015-05-06 NOTE — Progress Notes (Signed)
PATIENT: Kayla Kent DOB: [birth year]  REASON FOR VISIT: follow up- seizures events HISTORY FROM: patient  HISTORY OF PRESENT ILLNESS: Kayla Kent is an 81 year old female with a history of seizures. She returns today for an evaluation. The patient has remained on Keppra 250 mg twice a day. She denies any seizure events. She operates a Teacher, music without difficulty. He is able to complete all ADLs independently. Since the last visit she did have a fall while doing yardwork and unfortunately fractured her right wrist and right hip. She underwent surgery and has recovered quite nicely. She continues to live alone although now she has help with yard work. Denies any new neurological symptoms. She returns today for an evaluation.  HISTORY 03/28/14 (CD): Kayla Kent is a 81 y.o., caucasian , widowed female here as a yearly routine follow up upon initial referral from Dr. Linda Hedges for seizure versus syncope.The patient was originally sent to [facility] in 2013 for an evaluation of possible seizures . The first spell occurred in March 2013 , while in the [place] of [place].  The patient was sitting on a bench in the evening watching relatives play ball and suddenly had " this feeling of being pulled forward", her vision changed to gray, she could hear people around her -but could not respond. Her daughter witnessed the event and stated that her mother did not fall from the chair , but she threw her hands up for seconds, did not respond verbally.  The patient underwent a glucose check and electrolyte check at the ED, which were normal. Then she had a second event during a track meet ,watching her grandson run and the same spell happened to her. She recalls a woman, standing near to her, who was a nurse And checked her heart beat.  She came quickly back to normal levels of awareness . She never convulsed. She also overheard when another person in the  bleachers announced she Will call 911 and she responded verbally : " no".  There was no Confusion, impairment or any other symptoms . There were no cardiac events- after Dr. Linda Hedges evaluated the Patient.  Again, differential diagnosis included syncope. Yet , Dr. Jacelyn Grip told her that he did not establish a diagnosis , but he started her on Keppra , ( for seizures ) she worked up to 2 tablets in the morning and 2 in p.m. She had no side effects reported at the time . A MRI of the brain documented right anterior temporal encephalomalacia. EEG was non conclusive.  REVIEW OF SYSTEMS: Out of a complete 14 system review of symptoms, the patient complains only of the following symptoms, and all other reviewed systems are negative.  Environmental allergies, food allergies, insomnia  ALLERGIES: Allergies  Allergen Reactions  . Chocolate Hives  . Codeine Other (See Comments)    Patient states she acts crazy  . Diazepam Other (See Comments)    Patient states she acts crazy.  . Fruit & Vegetable Daily [Nutritional Supplements] Hives and Swelling    peaches  . Iohexol Hives     Code: HIVES, Desc: pt gets 13 hr pre-meds, Onset Date: [id number]   . Latex Hives  . Peach [Prunus Persica] Hives  . Peanut-Containing Drug Products Hives and Swelling  . Penicillins Hives, Itching and Swelling  . Strawberry Hives  . Sulfonamide Derivatives Hives    nausea  . Wheat Shortness Of Breath    Shortness of breath  . Iodine  Rash    HOME MEDICATIONS: Outpatient Prescriptions Prior to Visit  Medication Sig Dispense Refill  . acetaminophen (TYLENOL) 500 MG tablet Take 1 tablet (500 mg total) by mouth every 6 (six) hours as needed. 30 tablet 0  . ALPRAZolam (XANAX) 0.25 MG tablet TAKE ONE TABLET BY MOUTH THREE TIMES DAILY 180 tablet 1  . apixaban (ELIQUIS) 2.5 MG TABS tablet Take 1 tablet (2.5 mg total) by mouth 2 (two) times daily. 60 tablet 11  . diltiazem (DILACOR XR) 180 MG 24 hr capsule Take 1 capsule  (180 mg total) by mouth daily. 90 capsule 3  . levETIRAcetam (KEPPRA) 250 MG tablet Take 250 mg by mouth 2 (two) times daily.    Marland Kitchen levofloxacin (LEVAQUIN) 250 MG tablet Take 1 tablet (250 mg total) by mouth daily. 10 tablet 0  . rosuvastatin (CRESTOR) 5 MG tablet Take 1 tablet (5 mg total) by mouth at bedtime. 30 tablet 9  . CRESTOR 5 MG tablet TAKE ONE TABLET BY MOUTH AT BEDTIME (Patient not taking: Reported on 05/06/2015) 90 tablet 1  . midodrine (PROAMATINE) 2.5 MG tablet 1 TABLET BY MOUTH DAILY FOR ONE WEEK THEN STOP 60 tablet 10  . zolpidem (AMBIEN) 5 MG tablet Take 5 mg by mouth at bedtime as needed for sleep.      No facility-administered medications prior to visit.    PAST MEDICAL HISTORY: Past Medical History  Diagnosis Date  . Anxiety   . Paroxysmal supraventricular tachycardia   . Uterine prolapse without mention of vaginal wall prolapse   . HTN (hypertension)   . HLD (hyperlipidemia)   . Intolerance of drug     orthostatic  . PVD (peripheral vascular disease)   . Allergic rhinitis   . Syncope and collapse     PAST SURGICAL HISTORY: Past Surgical History  Procedure Laterality Date  . Corrective eye surgery      as a child  . Cholecystectomy    . Tonsillectomy    . Ivd removed    . Stress cardiolite  08/05/93  . Rf ablation psvt      summer '10  . Total hip arthroplasty Right 08/29/2014    Procedure: Right Hip Hemi Arthroplasty;  Surgeon: Marianna Payment, MD;  Location: [place];  Service: Orthopedics;  Laterality: Right;  Hip procedure 1st wants Peg Board, [employer], [employer]. Wrist - wants [employer].  Surgeon available at 1645.  Surgeon to inform sales reps.  Marland Kitchen Open reduction internal fixation (orif) distal radial fracture Right 08/29/2014    Procedure: OPEN REDUCTION INTERNAL FIXATION (ORIF) DISTAL RADIAL FRACTURE;  Surgeon: Marianna Payment, MD;  Location: [place];  Service: Orthopedics;  Laterality: Right;  . Cardioversion N/A 11/05/2014    Procedure:  CARDIOVERSION;  Surgeon: Candee Furbish, MD;  Location: [facility] ENDOSCOPY;  Service: Cardiovascular;  Laterality: N/A;    FAMILY HISTORY: Family History  Problem Relation Age of Onset  . Coronary artery disease Brother   . Hypertension      family hx  . Colon cancer Neg Hx   . Breast cancer Neg Hx   . Diabetes Neg Hx   . Mental illness Father     suicide  . Arthritis Father   . Hyperlipidemia Sister   . Hypertension Sister   . Heart disease Brother     CAD/MI  . Hypertension Brother   . Heart attack Brother   . Stroke Neg Hx   . Diabetes Neg Hx   . Diabetes Neg Hx  SOCIAL HISTORY: History   Social History  . Marital Status: Widowed    Spouse Name: N/A  . Number of Children: 2  . Years of Education: 18   Occupational History  . retired Radio producer    Social History Main Topics  . Smoking status: Former Smoker    Quit date: 12/08/1955  . Smokeless tobacco: Never Used     Comment: quit in 1995   . Alcohol Use: No  . Drug Use: No  . Sexual Activity: No   Other Topics Concern  . Not on file   Social History Narrative   [employer], [employer]-BA, [employer]-early childhood. Married '55 -49 years.  1 son - 12; 1 daughter - 68; 3 grandchildren . Lives alone. ACP - discussed and provided packet on end of life care (Feb '13)   Patient is now widowed.   Patient is right-handed.   Patient drinks tea daily.      PHYSICAL EXAM  Filed Vitals:   05/06/15 1448  BP: 147/73  Pulse: 76  Height: '5\' 4"'$  (1.626 m)  Weight: 115 lb (52.164 kg)   Body mass index is 19.73 kg/(m^2).  Generalized: Well developed, in no acute distress   Neurological examination  Mentation: Alert oriented to time, place, history taking. Follows all commands speech and language fluent Cranial nerve II-XII: Pupils were equal round reactive to light. Extraocular movements were full, visual field were full on confrontational test. Facial sensation and strength were normal. Uvula tongue midline.  Head turning and shoulder shrug  were normal and symmetric. Motor: The motor testing reveals 5 over 5 strength of all 4 extremities. Good symmetric motor tone is noted throughout.  Sensory: Sensory testing is intact to soft touch on all 4 extremities. No evidence of extinction is noted.  Coordination: Cerebellar testing reveals good finger-nose-finger and heel-to-shin bilaterally.  Gait and station: Gait is normal. Tandem gait is normal. Romberg is negative. No drift is seen.  Reflexes: Deep tendon reflexes are symmetric and normal bilaterally.   DIAGNOSTIC DATA (LABS, IMAGING, TESTING) - I reviewed patient records, labs, notes, testing and imaging myself where available.  ASSESSMENT AND PLAN 81 y.o. year old female  has a past medical history of Anxiety; Paroxysmal supraventricular tachycardia; Uterine prolapse without mention of vaginal wall prolapse; HTN (hypertension); HLD (hyperlipidemia); Intolerance of drug; PVD (peripheral vascular disease); Allergic rhinitis; and Syncope and collapse. here with:  1. Seizures   Overall the patient is doing well She will continue Keppra 250 mg twice a day. If the patient has any additional seizure events she should let us know. Follow-up in one year or sooner if needed.  Ward Givens, MSN, NP-C 05/06/2015, 2:51 PM [facility] [address] [place], [place] [address] [phone]  Note: This document was prepared with digital dictation and possible smart phrase technology. Any transcriptional errors that result from this process are unintentional.

## 2015-05-06 NOTE — Patient Instructions (Signed)
Continue Keppra.  If your symptoms worsen or you develop new symptoms please let us know.

## 2015-05-07 NOTE — Progress Notes (Signed)
I agree with the assessment and plan as directed by NP .The patient is known to me .   Jondavid Schreier, MD  

## 2015-06-27 ENCOUNTER — Other Ambulatory Visit: Payer: Self-pay | Admitting: Internal Medicine

## 2015-07-14 ENCOUNTER — Telehealth: Payer: Self-pay | Admitting: Internal Medicine

## 2015-07-14 NOTE — Telephone Encounter (Signed)
Ok sure

## 2015-07-14 NOTE — Telephone Encounter (Signed)
MD out of office will hold until he return tomorrow.../lmb 

## 2015-07-14 NOTE — Telephone Encounter (Signed)
Pt has a handicap placard that her orthopedic, Dr. Erlinda Hong issued to her last year. It needs to be renewed in November and they were wondering if you could fill it out for her since you are her PCP. She's high risk fall since her hip surgery. Please advise

## 2015-07-15 NOTE — Telephone Encounter (Signed)
Completed handicapp form md sign called pt no answer [facility] handicapp form ready for pick-up...Johny Chess

## 2015-07-25 ENCOUNTER — Telehealth: Payer: Self-pay | Admitting: Internal Medicine

## 2015-07-25 DIAGNOSIS — Z23 Encounter for immunization: Secondary | ICD-10-CM | POA: Diagnosis not present

## 2015-07-25 NOTE — Telephone Encounter (Signed)
I spoke with the patient. She states that she has some issues going on with her eyes right now and wanted to figure out what was going on there before scheduling follow up with Dr. Harrington Challenger. She states she is due to see [facility] on 08/05/15, but just got a post card in the mail today that she has an appointment on 08/18/15 with [facility] that she was unaware of. She will call back on Monday with any updates if possible.

## 2015-07-25 NOTE — Telephone Encounter (Signed)
New message      Pt received a letter stating she needs a nov visit.  She is having eye problems and want to nurse to know she will call as soon as she can.

## 2015-07-25 NOTE — Telephone Encounter (Signed)
LMTCB

## 2015-07-30 ENCOUNTER — Other Ambulatory Visit: Payer: Self-pay | Admitting: Internal Medicine

## 2015-07-31 ENCOUNTER — Other Ambulatory Visit: Payer: Self-pay | Admitting: Internal Medicine

## 2015-07-31 NOTE — Telephone Encounter (Signed)
Please check, pt last ov with Dr. Jenny Reichmann was 04/2015 and he suppose to come back in 6 month.

## 2015-08-05 ENCOUNTER — Telehealth: Payer: Self-pay | Admitting: Internal Medicine

## 2015-08-05 DIAGNOSIS — H35363 Drusen (degenerative) of macula, bilateral: Secondary | ICD-10-CM | POA: Diagnosis not present

## 2015-08-05 NOTE — Telephone Encounter (Signed)
New Message   Pt wants RN to call her because has detailed information for the RN

## 2015-08-05 NOTE — Telephone Encounter (Signed)
Patient states she needs cataract surgery and is scheduled for 08/18/15 and wanted to let Dr. Harrington Challenger know. She is very pleased with this plan since she has no vision in right eye and her left eye has been fuzzy.  She is unsure if a clearance will be sent to Dr. Harrington Challenger. Patient takes eliquis  She is due for a visit nov/dec per recall, scheduled her for December.

## 2015-08-07 NOTE — Telephone Encounter (Signed)
Pt is a low risk for major cardiac complication with cataract surgery.  No testing needed Ophthalmologist to decide stopping eliquis.  If needs to stop would hold day prior.

## 2015-08-08 NOTE — Telephone Encounter (Signed)
Have not received request for clearance/holding blood thinners as of this time.

## 2015-08-12 ENCOUNTER — Telehealth: Payer: Self-pay | Admitting: Internal Medicine

## 2015-08-12 NOTE — Telephone Encounter (Signed)
error 

## 2015-08-13 ENCOUNTER — Telehealth: Payer: Self-pay | Admitting: Internal Medicine

## 2015-08-13 NOTE — Telephone Encounter (Signed)
Error making sure this message was routed correctly

## 2015-08-13 NOTE — Telephone Encounter (Signed)
New Message   Request for surgical clearance:  1. What type of surgery is being performed? Cataract Surgery     2. When is this surgery scheduled? 08/18/2015  8:45  3. Are there any medications that need to be held prior to surgery and how long? Eliquis Not sure how long   4. Name of physician performing surgery?   5. What is your office phone and fax number? [place] [facility] Phone  [phone] fax   Comments: Pt was insistant to send the message. Informed pt to have the office call and req. Pt was persistent to have the message sent.

## 2015-08-13 NOTE — Telephone Encounter (Signed)
See previous phone encounter from 08/05/15. Placed this information in nurse fax bin in medical records to be faxed to [facility] at [phone].

## 2015-08-13 NOTE — Telephone Encounter (Signed)
Surgeon will decide if he wants it held  May not need to If does  Hold a couple doses.

## 2015-08-18 DIAGNOSIS — H25011 Cortical age-related cataract, right eye: Secondary | ICD-10-CM | POA: Diagnosis not present

## 2015-08-18 DIAGNOSIS — H524 Presbyopia: Secondary | ICD-10-CM | POA: Diagnosis not present

## 2015-08-18 DIAGNOSIS — H00029 Hordeolum internum unspecified eye, unspecified eyelid: Secondary | ICD-10-CM | POA: Diagnosis not present

## 2015-08-18 DIAGNOSIS — H2511 Age-related nuclear cataract, right eye: Secondary | ICD-10-CM | POA: Diagnosis not present

## 2015-08-19 HISTORY — PX: OTHER SURGICAL HISTORY: SHX169

## 2015-09-03 ENCOUNTER — Other Ambulatory Visit: Payer: Self-pay | Admitting: Internal Medicine

## 2015-09-03 NOTE — Telephone Encounter (Signed)
Done hardcopy to Dahlia  

## 2015-09-04 DIAGNOSIS — H2512 Age-related nuclear cataract, left eye: Secondary | ICD-10-CM | POA: Diagnosis not present

## 2015-09-04 DIAGNOSIS — H2513 Age-related nuclear cataract, bilateral: Secondary | ICD-10-CM | POA: Diagnosis not present

## 2015-09-04 DIAGNOSIS — H25812 Combined forms of age-related cataract, left eye: Secondary | ICD-10-CM | POA: Diagnosis not present

## 2015-09-04 NOTE — Telephone Encounter (Signed)
Rx faxed to pharmacy  

## 2015-09-10 DIAGNOSIS — H2513 Age-related nuclear cataract, bilateral: Secondary | ICD-10-CM | POA: Diagnosis not present

## 2015-09-22 ENCOUNTER — Ambulatory Visit (INDEPENDENT_AMBULATORY_CARE_PROVIDER_SITE_OTHER): Payer: Medicare Other | Admitting: Internal Medicine

## 2015-09-22 ENCOUNTER — Encounter: Payer: Self-pay | Admitting: Internal Medicine

## 2015-09-22 VITALS — BP 150/48 | HR 50 | Ht 66.5 in | Wt 117.8 lb

## 2015-09-22 DIAGNOSIS — I481 Persistent atrial fibrillation: Secondary | ICD-10-CM

## 2015-09-22 DIAGNOSIS — I4891 Unspecified atrial fibrillation: Secondary | ICD-10-CM | POA: Diagnosis not present

## 2015-09-22 DIAGNOSIS — I471 Supraventricular tachycardia: Secondary | ICD-10-CM | POA: Diagnosis not present

## 2015-09-22 DIAGNOSIS — I70209 Unspecified atherosclerosis of native arteries of extremities, unspecified extremity: Secondary | ICD-10-CM | POA: Diagnosis not present

## 2015-09-22 DIAGNOSIS — E785 Hyperlipidemia, unspecified: Secondary | ICD-10-CM

## 2015-09-22 DIAGNOSIS — I4819 Other persistent atrial fibrillation: Secondary | ICD-10-CM

## 2015-09-22 LAB — CBC
HEMATOCRIT: 40.7 % (ref 36.0–46.0)
Hemoglobin: 13.9 g/dL (ref 12.0–15.0)
MCH: 30.5 pg (ref 26.0–34.0)
MCHC: 34.2 g/dL (ref 30.0–36.0)
MCV: 89.3 fL (ref 78.0–100.0)
MPV: 11.8 fL (ref 8.6–12.4)
Platelets: 207 10*3/uL (ref 150–400)
RBC: 4.56 MIL/uL (ref 3.87–5.11)
RDW: 14.3 % (ref 11.5–15.5)
WBC: 6.8 10*3/uL (ref 4.0–10.5)

## 2015-09-22 LAB — LIPID PANEL
CHOLESTEROL: 192 mg/dL (ref 125–200)
HDL: 39 mg/dL — ABNORMAL LOW (ref >=46)
LDL Cholesterol: 126 mg/dL (ref <130)
Total CHOL/HDL Ratio: 4.9 Ratio (ref <=5.0)
Triglycerides: 135 mg/dL (ref <150)
VLDL: 27 mg/dL (ref <30)

## 2015-09-22 LAB — BASIC METABOLIC PANEL
BUN: 16 mg/dL (ref 7–25)
CALCIUM: 8.9 mg/dL (ref 8.6–10.4)
CO2: 29 mmol/L (ref 20–31)
Chloride: 106 mmol/L (ref 98–110)
Creat: 0.98 mg/dL — ABNORMAL HIGH (ref 0.60–0.88)
Glucose, Bld: 58 mg/dL — ABNORMAL LOW (ref 65–99)
Potassium: 3.7 mmol/L (ref 3.5–5.3)
Sodium: 145 mmol/L (ref 135–146)

## 2015-09-22 NOTE — Patient Instructions (Signed)
Your physician recommends that you continue on your current medications as directed. Please refer to the Current Medication list given to you today. Your physician recommends that you return for lab work today (CBC, BMET, [place])  Your physician wants you to follow-up in: [place].  You will receive a reminder letter in the mail two months in advance. If you don't receive a letter, please call our office to schedule the follow-up appointment.

## 2015-09-22 NOTE — Progress Notes (Signed)
Cardiology Office Note   Date:  09/22/2015   ID:  JULINE SANDERFORD, DOB 18-Sep-1932, MRN [id number]  PCP:  Cathlean Cower, MD  Cardiologist:   Dorris Carnes, MD   F/U of atrial fib      History of Present Illness: Kayla Kent is a 81 y.o. female with a history of atrial fib and HTN  I saw her in May   Since seen she denies palptiations  Breathing is OK  No dizzienss       Current Outpatient Prescriptions  Medication Sig Dispense Refill  . acetaminophen (TYLENOL) 500 MG tablet Take 500 mg by mouth every 6 (six) hours as needed for mild pain or headache.    . ALPRAZolam (XANAX) 0.25 MG tablet TAKE ONE TABLET BY MOUTH THREE TIMES DAILY 180 tablet 1  . apixaban (ELIQUIS) 2.5 MG TABS tablet Take 1 tablet (2.5 mg total) by mouth 2 (two) times daily. 60 tablet 11  . ciprofloxacin (CIPRO) 500 MG tablet Take 500 mg by mouth as needed. 1 hour before dental procedures    . diltiazem (DILACOR XR) 180 MG 24 hr capsule Take 1 capsule (180 mg total) by mouth daily. 90 capsule 3  . DUREZOL 0.05 % EMUL Place 1 drop into the left eye 2 (two) times daily.    . ILEVRO 0.3 % ophthalmic suspension Place 1 drop into the left eye daily.    Marland Kitchen levETIRAcetam (KEPPRA) 250 MG tablet Take 250 mg by mouth 2 (two) times daily.    Marland Kitchen levETIRAcetam (KEPPRA) 250 MG tablet TAKE ONE TABLET BY MOUTH TWICE DAILY 120 tablet 1  . levofloxacin (LEVAQUIN) 250 MG tablet Take 1 tablet (250 mg total) by mouth daily. 10 tablet 0   No current facility-administered medications for this visit.    Allergies:   Chocolate; Codeine; Diazepam; Fruit & vegetable daily; Iohexol; Latex; Peach; Peanut-containing drug products; Penicillins; Strawberry extract; Sulfonamide derivatives; Wheat; Wheat bran; Sulfamethoxazole; Crestor; and Iodine   Past Medical History  Diagnosis Date  . Anxiety   . Paroxysmal supraventricular tachycardia ([place])   . Uterine prolapse without mention of vaginal wall prolapse   . HTN (hypertension)   . HLD  (hyperlipidemia)   . Intolerance of drug     orthostatic  . PVD (peripheral vascular disease) ([place])   . Allergic rhinitis   . Syncope and collapse     Past Surgical History  Procedure Laterality Date  . Corrective eye surgery      as a child  . Cholecystectomy    . Tonsillectomy    . Ivd removed    . Stress cardiolite  08/05/93  . Rf ablation psvt      summer '10  . Total hip arthroplasty Right 08/29/2014    Procedure: Right Hip Hemi Arthroplasty;  Surgeon: Marianna Payment, MD;  Location: [place];  Service: Orthopedics;  Laterality: Right;  Hip procedure 1st wants Peg Board, [employer], [place]. Wrist - wants [employer].  Surgeon available at 1645.  Surgeon to inform sales reps.  Marland Kitchen Open reduction internal fixation (orif) distal radial fracture Right 08/29/2014    Procedure: OPEN REDUCTION INTERNAL FIXATION (ORIF) DISTAL RADIAL FRACTURE;  Surgeon: Marianna Payment, MD;  Location: [place];  Service: Orthopedics;  Laterality: Right;  . Cardioversion N/A 11/05/2014    Procedure: CARDIOVERSION;  Surgeon: Candee Furbish, MD;  Location: [facility] ENDOSCOPY;  Service: Cardiovascular;  Laterality: N/A;     Social History:  The patient  reports that she  quit smoking about 59 years ago. She has never used smokeless tobacco. She reports that she does not drink alcohol or use illicit drugs.   Family History:  The patient's family history includes Arthritis in her father; Coronary artery disease in her brother; Heart attack in her brother; Heart disease in her brother; Hyperlipidemia in her sister; Hypertension in her brother, sister, and another family member; Mental illness in her father. There is no history of Colon cancer, Breast cancer, Diabetes, Stroke, Diabetes, or Diabetes.    ROS:  Please see the history of present illness. All other systems are reviewed and  Negative to the above problem except as noted.    PHYSICAL EXAM: VS:  BP 150/48 mmHg  Pulse 50  Ht 5' 6.5" (1.689 m)  Wt 53.434 kg  (117 lb 12.8 oz)  BMI 18.73 kg/m2  SpO2 99%  GEN: Well nourished, well developed, in no acute distress HEENT: normal Neck: no JVD, carotid bruits, or masses Cardiac: RRR; no murmurs, rubs, or gallops,no edema  Respiratory:  clear to auscultation bilaterally, normal work of breathing GI: soft, nontender, nondistended, + BS  No hepatomegaly  MS: no deformity Moving all extremities   Skin: warm and dry, no rash Neuro:  Strength and sensation are intact Psych: euthymic mood, full affect   EKG:  EKG is not ordered today.   Lipid Panel    Component Value Date/Time   CHOL 141 09/24/2014 0009   TRIG 89 09/24/2014 0009   TRIG 141 10/25/2006 0906   HDL 30* 09/24/2014 0009   CHOLHDL 4.7 09/24/2014 0009   CHOLHDL 7.9 CALC 10/25/2006 0906   VLDL 18 09/24/2014 0009   LDLCALC 93 09/24/2014 0009   LDLDIRECT 175.3 01/19/2008 1216   LDLDIRECT 152.0 10/25/2006 0906      Wt Readings from Last 3 Encounters:  09/22/15 53.434 kg (117 lb 12.8 oz)  05/06/15 52.164 kg (115 lb)  04/22/15 52.617 kg (116 lb)      ASSESSMENT AND PLAN:  1.  Atrial fibrillation.  Continue on current regimen  Intermitt WIll check CBC and BMET  2.  HTN  BP is a little high I would not change medications  Follow     Signed, Dorris Carnes, MD  09/22/2015 2:18 PM    [facility] [address], [place], [place]  [address] Phone: [phone]; Fax: [phone]

## 2015-09-24 ENCOUNTER — Telehealth: Payer: Self-pay | Admitting: *Deleted

## 2015-09-24 DIAGNOSIS — E785 Hyperlipidemia, unspecified: Secondary | ICD-10-CM

## 2015-09-24 MED ORDER — ATORVASTATIN CALCIUM 10 MG PO TABS: 10.0000 mg | ORAL_TABLET | Freq: Every day | ORAL | Status: DC

## 2015-09-24 NOTE — Telephone Encounter (Signed)
Informed patient. She will call back to schedule lipids for in about 8 weeks.  She will start lipitor.

## 2015-09-24 NOTE — Telephone Encounter (Signed)
-----   Message from Dorris Carnes V, MD sent at 09/23/2015  2:24 PM EST ----- Electrolytes and CBC are OK LDL is 126  She has peripheral artery disease I know she has not tolearted crestor  What about lipitor 10 mg daily with f/u lipids in 8 wks

## 2015-10-14 ENCOUNTER — Other Ambulatory Visit: Payer: Self-pay

## 2015-10-14 MED ORDER — APIXABAN 2.5 MG PO TABS: 2.5000 mg | ORAL_TABLET | Freq: Two times a day (BID) | ORAL | Status: DC

## 2015-10-30 ENCOUNTER — Other Ambulatory Visit (INDEPENDENT_AMBULATORY_CARE_PROVIDER_SITE_OTHER): Payer: Medicare Other | Admitting: *Deleted

## 2015-10-30 DIAGNOSIS — E785 Hyperlipidemia, unspecified: Secondary | ICD-10-CM | POA: Diagnosis not present

## 2015-10-30 LAB — LIPID PANEL
CHOLESTEROL: 132 mg/dL (ref 125–200)
HDL: 33 mg/dL — ABNORMAL LOW (ref >=46)
LDL CALC: 85 mg/dL (ref <130)
TRIGLYCERIDES: 71 mg/dL (ref <150)
Total CHOL/HDL Ratio: 4 Ratio (ref <=5.0)
VLDL: 14 mg/dL (ref <30)

## 2015-10-30 NOTE — Addendum Note (Signed)
Addended by: Eulis Foster on: 10/30/2015 08:18 AM   Modules accepted: Orders

## 2015-11-20 ENCOUNTER — Encounter: Payer: Self-pay | Admitting: Cardiovascular Disease

## 2015-11-26 ENCOUNTER — Other Ambulatory Visit: Payer: Self-pay | Admitting: Internal Medicine

## 2016-02-17 ENCOUNTER — Other Ambulatory Visit: Payer: Self-pay | Admitting: Internal Medicine

## 2016-02-17 NOTE — Telephone Encounter (Signed)
Done hardcopy to Corinne  

## 2016-02-18 NOTE — Telephone Encounter (Signed)
Medication sent to pharmacy  

## 2016-04-10 ENCOUNTER — Other Ambulatory Visit: Payer: Self-pay | Admitting: Internal Medicine

## 2016-04-12 NOTE — Telephone Encounter (Signed)
Please advise 

## 2016-04-13 NOTE — Telephone Encounter (Signed)
Medication refill sent to pharmacy  

## 2016-04-13 NOTE — Telephone Encounter (Signed)
1 mo only' \\Done'$  hardcopy to Corinne  Pt due for ROV please

## 2016-05-05 ENCOUNTER — Ambulatory Visit (INDEPENDENT_AMBULATORY_CARE_PROVIDER_SITE_OTHER): Payer: Medicare Other | Admitting: Adult Health

## 2016-05-05 ENCOUNTER — Encounter: Payer: Self-pay | Admitting: Adult Health

## 2016-05-05 VITALS — BP 156/56 | HR 61 | Wt 117.4 lb

## 2016-05-05 DIAGNOSIS — R569 Unspecified convulsions: Secondary | ICD-10-CM

## 2016-05-05 NOTE — Progress Notes (Signed)
PATIENT: Kayla Kent DOB: 07-31-1932  REASON FOR VISIT: follow up- seizures HISTORY FROM: patient  HISTORY OF PRESENT ILLNESS: Kayla Kent is an 82 year old female with a history of seizures. She returns today for follow-up. She continues on Keppra 250 mg twice a day. No seizure events. Able to complete all ADLs independently. Lives at home alone. No changes in mood or behavior. No change in gait or balance. She is able to prepare her own meals. Handles her own finances without difficulty. Participates in an exercise class. Reports that she is very social. Denies any new neurological symptoms. Returns today for an evaluation.   HISTORY 05/06/15 (MM): Kayla Kent is an 82 year old female with a history of seizures. She returns today for an evaluation. The patient has remained on Keppra 250 mg twice a day. She denies any seizure events. She operates a Teacher, music without difficulty. He is able to complete all ADLs independently. Since the last visit she did have a fall while doing yardwork and unfortunately fractured her right wrist and right hip. She underwent surgery and has recovered quite nicely. She continues to live alone although now she has help with yard work. Denies any new neurological symptoms. She returns today for an evaluation.  HISTORY 03/28/14 (CD): Kayla Kent is a 82 y.o., caucasian , widowed female here as a yearly routine follow up upon initial referral from Dr. Linda Hedges for seizure versus syncope.The patient was originally sent to [facility] in 2013 for an evaluation of possible seizures . The first spell occurred in March 2013 , while in the [place] of [place].  The patient was sitting on a bench in the evening watching relatives play ball and suddenly had " this feeling of being pulled forward", her vision changed to gray, she could hear people around her -but could not respond. Her daughter witnessed the event and stated that her  mother did not fall from the chair , but she threw her hands up for seconds, did not respond verbally.  The patient underwent a glucose check and electrolyte check at the ED, which were normal. Then she had a second event during a track meet ,watching her grandson run and the same spell happened to her. She recalls a woman, standing near to her, who was a nurse And checked her heart beat.  She came quickly back to normal levels of awareness . She never convulsed. She also overheard when another person in the bleachers announced she Will call 911 and she responded verbally : " no".  There was no Confusion, impairment or any other symptoms . There were no cardiac events- after Dr. Linda Hedges evaluated the Patient.  Again, differential diagnosis included syncope. Yet , Dr. Jacelyn Grip told her that he did not establish a diagnosis , but he started her on Keppra , ( for seizures ) she worked up to 2 tablets in the morning and 2 in p.m. She had no side effects reported at the time . A MRI of the brain documented right anterior temporal encephalomalacia. EEG was non conclusive.  REVIEW OF SYSTEMS: Out of a complete 14 system review of symptoms, the patient complains only of the following symptoms, and all other reviewed systems are negative.  Frequency of urination, environmental allergies, food allergies, bruise easily  ALLERGIES: Allergies  Allergen Reactions  . Chocolate Hives  . Codeine Other (See Comments)    Patient states she acts crazy  . Diazepam Other (See Comments)  Patient states she acts crazy.  . Fruit & Vegetable Daily [Nutritional Supplements] Hives and Swelling    peaches  . Iohexol Hives     Code: HIVES, Desc: pt gets 13 hr pre-meds, Onset Date: [id number]   . Latex Hives  . Peach [Prunus Persica] Hives  . Peanut-Containing Drug Products Hives and Swelling  . Penicillins Hives, Itching and Swelling  . Strawberry Extract Hives  . Sulfonamide Derivatives Hives    nausea    . Wheat Shortness Of Breath    Shortness of breath  . Wheat Bran Shortness Of Breath  . Sulfamethoxazole Hives  . Crestor [Rosuvastatin Calcium] Rash  . Iodine Rash    HOME MEDICATIONS: Outpatient Prescriptions Prior to Visit  Medication Sig Dispense Refill  . ALPRAZolam (XANAX) 0.25 MG tablet TAKE ONE TABLET BY MOUTH THREE TIMES DAILY 90 tablet 0  . apixaban (ELIQUIS) 2.5 MG TABS tablet Take 1 tablet (2.5 mg total) by mouth 2 (two) times daily. 60 tablet 11  . atorvastatin (LIPITOR) 10 MG tablet Take 1 tablet (10 mg total) by mouth daily at 6 PM. 30 tablet 11  . levETIRAcetam (KEPPRA) 250 MG tablet TAKE ONE TABLET BY MOUTH TWICE DAILY 120 tablet 5  . zolpidem (AMBIEN) 5 MG tablet TAKE ONE TABLET BY MOUTH AT BEDTIME AS NEEDED FOR INSOMNIA 30 tablet 2  . diltiazem (DILACOR XR) 180 MG 24 hr capsule Take 1 capsule (180 mg total) by mouth daily. 90 capsule 3  . acetaminophen (TYLENOL) 500 MG tablet Take 500 mg by mouth every 6 (six) hours as needed for mild pain or headache. Reported on 05/05/2016    . ciprofloxacin (CIPRO) 500 MG tablet Take 500 mg by mouth as needed. Reported on 05/05/2016    . DUREZOL 0.05 % EMUL Place 1 drop into the left eye 2 (two) times daily.    . ILEVRO 0.3 % ophthalmic suspension Place 1 drop into the left eye daily.    Marland Kitchen levETIRAcetam (KEPPRA) 250 MG tablet Take 250 mg by mouth 2 (two) times daily.    Marland Kitchen levofloxacin (LEVAQUIN) 250 MG tablet Take 1 tablet (250 mg total) by mouth daily. 10 tablet 0   No facility-administered medications prior to visit.    PAST MEDICAL HISTORY: Past Medical History  Diagnosis Date  . Anxiety   . Paroxysmal supraventricular tachycardia ([place])   . Uterine prolapse without mention of vaginal wall prolapse   . HTN (hypertension)   . HLD (hyperlipidemia)   . Intolerance of drug     orthostatic  . PVD (peripheral vascular disease) ([employer])   . Allergic rhinitis   . Syncope and collapse     PAST SURGICAL HISTORY: Past Surgical  History  Procedure Laterality Date  . Corrective eye surgery      as a child  . Cholecystectomy    . Tonsillectomy    . Ivd removed    . Stress cardiolite  08/05/93  . Rf ablation psvt      summer '10  . Total hip arthroplasty Right 08/29/2014    Procedure: Right Hip Hemi Arthroplasty;  Surgeon: Marianna Payment, MD;  Location: [place];  Service: Orthopedics;  Laterality: Right;  Hip procedure 1st wants Peg Board, [employer], [place].   Marland Kitchen Open reduction internal fixation (orif) distal radial fracture Right 08/29/2014    Procedure: OPEN REDUCTION INTERNAL FIXATION (ORIF) DISTAL RADIAL FRACTURE;  Surgeon: Marianna Payment, MD;  Location: [place];  Service: Orthopedics;  Laterality: Right;  .  Cardioversion N/A 11/05/2014    Procedure: CARDIOVERSION;  Surgeon: Candee Furbish, MD;  Location: [facility] ENDOSCOPY;  Service: Cardiovascular;  Laterality: N/A;  . Cataract surgery  08/2015    FAMILY HISTORY: Family History  Problem Relation Age of Onset  . Coronary artery disease Brother   . Hypertension      family hx  . Colon cancer Neg Hx   . Breast cancer Neg Hx   . Diabetes Neg Hx   . Mental illness Father     suicide  . Arthritis Father   . Hyperlipidemia Sister   . Hypertension Sister   . Heart disease Brother     CAD/MI  . Hypertension Brother   . Heart attack Brother   . Stroke Neg Hx   . Diabetes Neg Hx   . Diabetes Neg Hx     SOCIAL HISTORY: Social History   Social History  . Marital Status: Widowed    Spouse Name: N/A  . Number of Children: 2  . Years of Education: 18   Occupational History  . retired Radio producer    Social History Main Topics  . Smoking status: Former Smoker    Quit date: 12/08/1955  . Smokeless tobacco: Never Used     Comment: quit in 1995   . Alcohol Use: No  . Drug Use: No  . Sexual Activity: No   Other Topics Concern  . Not on file   Social History Narrative   [employer], [employer]-BA, [employer] M[employer]-early childhood. Married '55 -59  years.  1 son - 29; 1 daughter - 57; 3 grandchildren . Lives alone. ACP - discussed and provided packet on end of life care (Feb '13)   Patient is now widowed.   Patient is right-handed.   Patient drinks tea daily.      PHYSICAL EXAM  Filed Vitals:   05/05/16 1427  BP: 156/56  Pulse: 61  Weight: 117 lb 6.4 oz (53.252 kg)   Body mass index is 18.67 kg/(m^2).  Generalized: Well developed, in no acute distress   Neurological examination  Mentation: Alert oriented to time, place, history taking. Follows all commands speech and language fluent Cranial nerve II-XII: Pupils were equal round reactive to light. Extraocular movements were full, visual field were full on confrontational test. Facial sensation and strength were normal. Uvula tongue midline. Head turning and shoulder shrug  were normal and symmetric. Motor: The motor testing reveals 5 over 5 strength of all 4 extremities. Good symmetric motor tone is noted throughout.  Sensory: Sensory testing is intact to soft touch on all 4 extremities. No evidence of extinction is noted.  Coordination: Cerebellar testing reveals good finger-nose-finger and heel-to-shin bilaterally.  Gait and station: Gait is normal. Tandem gait is normal. Romberg is negative. No drift is seen.  Reflexes: Deep tendon reflexes are symmetric and normal bilaterally.   DIAGNOSTIC DATA (LABS, IMAGING, TESTING) - I reviewed patient records, labs, notes, testing and imaging myself where available.  Lab Results  Component Value Date   WBC 6.8 09/22/2015   HGB 13.9 09/22/2015   HCT 40.7 09/22/2015   MCV 89.3 09/22/2015   PLT 207 09/22/2015      Component Value Date/Time   NA 145 09/22/2015 1511   K 3.7 09/22/2015 1511   CL 106 09/22/2015 1511   CO2 29 09/22/2015 1511   GLUCOSE 58* 09/22/2015 1511   GLUCOSE 107* 10/25/2006 0906   BUN 16 09/22/2015 1511   CREATININE 0.98* 09/22/2015 1511   CREATININE 0.99 03/13/2015  1246   CREATININE 1.39* 05/07/2009  1630   CALCIUM 8.9 09/22/2015 1511   PROT 6.7 10/23/2014 1111   ALBUMIN 3.6 10/23/2014 1111   AST 16 10/23/2014 1111   ALT 11 10/23/2014 1111   ALKPHOS 70 10/23/2014 1111   BILITOT 0.6 10/23/2014 1111   GFRNONAA 53* 09/25/2014 1202   GFRAA 61* 09/25/2014 1202   Lab Results  Component Value Date   CHOL 132 10/30/2015   HDL 33* 10/30/2015   LDLCALC 85 10/30/2015   LDLDIRECT 175.3 01/19/2008   TRIG 71 10/30/2015   CHOLHDL 4.0 10/30/2015         ASSESSMENT AND PLAN 82 y.o. year old female  has a past medical history of Anxiety; Paroxysmal supraventricular tachycardia ([place]); Uterine prolapse without mention of vaginal wall prolapse; HTN (hypertension); HLD (hyperlipidemia); Intolerance of drug; PVD (peripheral vascular disease) ([place]); Allergic rhinitis; and Syncope and collapse. here with:  1. Seizures  Overall the patient is doing well. Will continue on Keppra 250 mg twice a day. Advised that if she has any  seizure event she she'll let us know. Follow-up in one year or sooner if needed.     Ward Givens, MSN, NP-C 05/05/2016, 2:42 PM [facility] [address], [place],  [address] [phone]

## 2016-05-05 NOTE — Patient Instructions (Signed)
Continue Keppra 250 mg twice a day If your symptoms worsen or you develop new symptoms please let us know.

## 2016-05-06 NOTE — Progress Notes (Signed)
I agree with the assessment and plan as directed by NP .The patient is known to me .   Pal Shell, MD  

## 2016-05-08 ENCOUNTER — Other Ambulatory Visit: Payer: Self-pay | Admitting: Internal Medicine

## 2016-06-07 ENCOUNTER — Other Ambulatory Visit: Payer: Self-pay | Admitting: Internal Medicine

## 2016-06-08 ENCOUNTER — Other Ambulatory Visit: Payer: Self-pay | Admitting: Internal Medicine

## 2016-06-09 NOTE — Telephone Encounter (Signed)
Done hardcopy to Corinne  Please let pt know - she is due for ROV

## 2016-06-10 NOTE — Telephone Encounter (Signed)
Mediation refill sent to pharmacy

## 2016-07-26 ENCOUNTER — Encounter: Payer: Self-pay | Admitting: Internal Medicine

## 2016-07-29 ENCOUNTER — Other Ambulatory Visit: Payer: Self-pay | Admitting: Internal Medicine

## 2016-08-03 DIAGNOSIS — Z23 Encounter for immunization: Secondary | ICD-10-CM | POA: Diagnosis not present

## 2016-08-06 ENCOUNTER — Ambulatory Visit (INDEPENDENT_AMBULATORY_CARE_PROVIDER_SITE_OTHER): Payer: Medicare Other | Admitting: Internal Medicine

## 2016-08-06 VITALS — HR 59 | Ht 66.5 in | Wt 114.0 lb

## 2016-08-06 DIAGNOSIS — I10 Essential (primary) hypertension: Secondary | ICD-10-CM | POA: Diagnosis not present

## 2016-08-06 DIAGNOSIS — Z79899 Other long term (current) drug therapy: Secondary | ICD-10-CM | POA: Diagnosis not present

## 2016-08-06 LAB — CBC
HCT: 42.1 % (ref 35.0–45.0)
Hemoglobin: 13.4 g/dL (ref 11.7–15.5)
MCH: 29.1 pg (ref 27.0–33.0)
MCHC: 31.8 g/dL — ABNORMAL LOW (ref 32.0–36.0)
MCV: 91.5 fL (ref 80.0–100.0)
MPV: 11.2 fL (ref 7.5–12.5)
PLATELETS: 194 10*3/uL (ref 140–400)
RBC: 4.6 MIL/uL (ref 3.80–5.10)
RDW: 14.4 % (ref 11.0–15.0)
WBC: 7.2 10*3/uL (ref 3.8–10.8)

## 2016-08-06 LAB — BASIC METABOLIC PANEL
BUN: 20 mg/dL (ref 7–25)
CHLORIDE: 105 mmol/L (ref 98–110)
CO2: 27 mmol/L (ref 20–31)
CREATININE: 1.16 mg/dL — AB (ref 0.60–0.88)
Calcium: 9.2 mg/dL (ref 8.6–10.4)
Glucose, Bld: 59 mg/dL — ABNORMAL LOW (ref 65–99)
Potassium: 4.8 mmol/L (ref 3.5–5.3)
Sodium: 144 mmol/L (ref 135–146)

## 2016-08-06 MED ORDER — AMLODIPINE BESYLATE 2.5 MG PO TABS: 2.5000 mg | ORAL_TABLET | Freq: Every day | ORAL | 6 refills | Status: DC

## 2016-08-06 NOTE — Patient Instructions (Signed)
Medication Instructions:  Please start Amlodipine 2.5 mg a day. Continue all other medications as listed.  Labwork: Please have blood work today. (CBC, BMP)  Follow-Up: Follow up in 4 weeks with Dr Harrington Challenger.  If you need a refill on your cardiac medications before your next appointment, please call your pharmacy.  Thank you for choosing [facility]!!

## 2016-08-06 NOTE — Progress Notes (Signed)
Cardiology Office Note   Date:  08/08/2016   ID:  Kayla Kent, DOB December 29, 1931, MRN [id number]  PCP:  Cathlean Cower, MD  Cardiologist:   Dorris Carnes, MD    Pt presents for f/u of atrial fib and HTN   History of Present Illness: Kayla Kent is a 82 y.o. female with a history of atrial fib and HTN  I saw her in December   Since seen she says that she has been feeling good  Breating is OK  No CP No dizziness   Active         Outpatient Medications Prior to Visit  Medication Sig Dispense Refill  . acetaminophen (TYLENOL) 500 MG tablet Take 500 mg by mouth every 6 (six) hours as needed for mild pain or headache. Reported on 05/05/2016    . ALPRAZolam (XANAX) 0.25 MG tablet TAKE ONE TABLET BY MOUTH THREE TIMES DAILY 90 tablet 0  . apixaban (ELIQUIS) 2.5 MG TABS tablet Take 1 tablet (2.5 mg total) by mouth 2 (two) times daily. 60 tablet 11  . atorvastatin (LIPITOR) 10 MG tablet Take 1 tablet (10 mg total) by mouth daily at 6 PM. 30 tablet 11  . diltiazem (DILACOR XR) 180 MG 24 hr capsule TAKE ONE CAPSULE BY MOUTH ONCE DAILY 90 capsule 0  . levETIRAcetam (KEPPRA) 250 MG tablet TAKE ONE TABLET BY MOUTH TWICE DAILY 120 tablet 5  . zolpidem (AMBIEN) 5 MG tablet TAKE ONE TABLET BY MOUTH AT BEDTIME AS NEEDED FOR INSOMNIA 30 tablet 2  . ciprofloxacin (CIPRO) 500 MG tablet Take 500 mg by mouth as needed. Reported on 05/05/2016     No facility-administered medications prior to visit.      Allergies:   Chocolate; Codeine; Diazepam; Fruit & vegetable daily [nutritional supplements]; Iohexol; Latex; Peach [prunus persica]; Peanut-containing drug products; Penicillins; Strawberry extract; Sulfonamide derivatives; Wheat; Wheat bran; Sulfamethoxazole; Crestor [rosuvastatin calcium]; and Iodine   Past Medical History:  Diagnosis Date  . Allergic rhinitis   . Anxiety   . HLD (hyperlipidemia)   . HTN (hypertension)   . Intolerance of drug    orthostatic  . Paroxysmal supraventricular  tachycardia ([place])   . PVD (peripheral vascular disease) ([place])   . Syncope and collapse   . Uterine prolapse without mention of vaginal wall prolapse     Past Surgical History:  Procedure Laterality Date  . CARDIOVERSION N/A 11/05/2014   Procedure: CARDIOVERSION;  Surgeon: Candee Furbish, MD;  [facility] ENDOSCOPY;  Service: Cardiovascular;  Laterality: N/A;  . cataract surgery  08/2015  . CHOLECYSTECTOMY    . corrective eye surgery     as a child  . IVD removed    . OPEN REDUCTION INTERNAL FIXATION (ORIF) DISTAL RADIAL FRACTURE Right 08/29/2014   Procedure: OPEN REDUCTION INTERNAL FIXATION (ORIF) DISTAL RADIAL FRACTURE;  Surgeon: Marianna Payment, MD;  Location: [place];  Service: Orthopedics;  Laterality: Right;  . RF ablation PSVT     summer '10  . stress cardiolite  08/05/93  . TONSILLECTOMY    . TOTAL HIP ARTHROPLASTY Right 08/29/2014   Procedure: Right Hip Hemi Arthroplasty;  Surgeon: Marianna Payment, MD;  Location: [place];  Service: Orthopedics;  Laterality: Right;  Hip procedure 1st wants Peg Board, [employer], [place].      Social History:  The patient  reports that she quit smoking about 60 years ago. She has never used smokeless tobacco. She reports that she does not drink alcohol or  use drugs.   Family History:  The patient's family history includes Arthritis in her father; Coronary artery disease in her brother; Heart attack in her brother; Heart disease in her brother; Hyperlipidemia in her sister; Hypertension in her brother and sister; Mental illness in her father.    ROS:  Please see the history of present illness. All other systems are reviewed and  Negative to the above problem except as noted.    PHYSICAL EXAM: VS:  Pulse (!) 59   Ht 5' 6.5" (1.689 m)   Wt 114 lb (51.7 kg)   BMI 18.12 kg/m  BP 150/58    BP 150/58   GEN: Well nourished, well developed, in no acute distress  HEENT: normal  Neck: no JVD, carotid bruits, or masses Cardiac: RRR; no  murmurs, rubs, or gallops,no edema  Respiratory:  clear to auscultation bilaterally, normal work of breathing GI: soft, nontender, nondistended, + BS  No hepatomegaly  MS: no deformity Moving all extremities   Skin: warm and dry, no rash Neuro:  Strength and sensation are intact Psych: euthymic mood, full affect   EKG:  EKG is ordered today. Atrial fib 59 bpm     Lipid Panel    Component Value Date/Time   CHOL 132 10/30/2015 0931   TRIG 71 10/30/2015 0931   TRIG 141 10/25/2006 0906   HDL 33 (L) 10/30/2015 0931   CHOLHDL 4.0 10/30/2015 0931   VLDL 14 10/30/2015 0931   LDLCALC 85 10/30/2015 0931   LDLDIRECT 175.3 01/19/2008 1216      Wt Readings from Last 3 Encounters:  08/06/16 114 lb (51.7 kg)  05/05/16 117 lb 6.4 oz (53.3 kg)  09/22/15 117 lb 12.8 oz (53.4 kg)      ASSESSMENT AND PLAN:  1  HTN  BP is high  I would recomm adding 2.5 amlodipine F/U in clinic in 4 wks  Goal would be to increase amlodipine and cut back on dilt (HR is low)  2  Atrial fib  Continue rate control and Eliquis  Check CBC and BMET     Current medicines are reviewed at length with the patient today.  The patient does not have concerns regarding medicines.  Signed, Dorris Carnes, MD  08/08/2016 11:16 PM    [address], [place],   [address] Phone: [phone]; Fax: [phone]

## 2016-08-09 ENCOUNTER — Telehealth: Payer: Self-pay | Admitting: Internal Medicine

## 2016-08-09 NOTE — Telephone Encounter (Signed)
New message  Pt returning RN call.please call back to discuss

## 2016-09-03 ENCOUNTER — Other Ambulatory Visit: Payer: Self-pay | Admitting: Internal Medicine

## 2016-09-13 ENCOUNTER — Encounter: Payer: Self-pay | Admitting: Internal Medicine

## 2016-09-13 ENCOUNTER — Ambulatory Visit (INDEPENDENT_AMBULATORY_CARE_PROVIDER_SITE_OTHER): Payer: Medicare Other | Admitting: Internal Medicine

## 2016-09-13 VITALS — BP 136/72 | HR 80 | Ht 66.5 in | Wt 109.1 lb

## 2016-09-13 DIAGNOSIS — I10 Essential (primary) hypertension: Secondary | ICD-10-CM | POA: Diagnosis not present

## 2016-09-13 DIAGNOSIS — I480 Paroxysmal atrial fibrillation: Secondary | ICD-10-CM | POA: Diagnosis not present

## 2016-09-13 NOTE — Patient Instructions (Signed)
Your physician recommends that you continue on your current medications as directed. Please refer to the Current Medication list given to you today. Your physician wants you to follow-up in: 6 months with Dr. Ross.  You will receive a reminder letter in the mail two months in advance. If you don't receive a letter, please call our office to schedule the follow-up appointment.  

## 2016-09-13 NOTE — Progress Notes (Signed)
Cardiology Office Note   Date:  09/13/2016   ID:  Kayla Kent, DOB Mar 23, 1932, MRN [id number]  PCP:  Cathlean Cower, MD  Cardiologist:   Dorris Carnes, MD   F/U of HTN     History of Present Illness: Kayla Kent is a 82 y.o. female with a history of atrial fib and HTN  I saw her in October 2017   At taht time BP was up and I added amlodipine  Plan to cut back on dilt   Since seen she has done well  Denies dizziness  Breathing is OK  NO palpiations       Outpatient Medications Prior to Visit  Medication Sig Dispense Refill  . ALPRAZolam (XANAX) 0.25 MG tablet TAKE ONE TABLET BY MOUTH THREE TIMES DAILY 90 tablet 0  . amLODipine (NORVASC) 2.5 MG tablet Take 1 tablet (2.5 mg total) by mouth daily. 30 tablet 6  . apixaban (ELIQUIS) 2.5 MG TABS tablet Take 1 tablet (2.5 mg total) by mouth 2 (two) times daily. 60 tablet 11  . atorvastatin (LIPITOR) 10 MG tablet TAKE ONE TABLET BY MOUTH ONCE DAILY AT 6 PM 30 tablet 10  . diltiazem (DILACOR XR) 180 MG 24 hr capsule TAKE ONE CAPSULE BY MOUTH ONCE DAILY 90 capsule 0  . levETIRAcetam (KEPPRA) 250 MG tablet TAKE ONE TABLET BY MOUTH TWICE DAILY 120 tablet 5  . zolpidem (AMBIEN) 5 MG tablet TAKE ONE TABLET BY MOUTH AT BEDTIME AS NEEDED FOR INSOMNIA 30 tablet 2  . acetaminophen (TYLENOL) 500 MG tablet Take 500 mg by mouth every 6 (six) hours as needed for mild pain or headache. Reported on 05/05/2016     No facility-administered medications prior to visit.      Allergies:   Chocolate; Codeine; Diazepam; Fruit & vegetable daily [nutritional supplements]; Iohexol; Latex; Peach [prunus persica]; Peanut-containing drug products; Penicillins; Strawberry extract; Sulfonamide derivatives; Wheat; Wheat bran; Sulfamethoxazole; Crestor [rosuvastatin calcium]; and Iodine   Past Medical History:  Diagnosis Date  . Allergic rhinitis   . Anxiety   . HLD (hyperlipidemia)   . HTN (hypertension)   . Intolerance of drug    orthostatic  . Paroxysmal  supraventricular tachycardia ([place])   . PVD (peripheral vascular disease) ([place])   . Syncope and collapse   . Uterine prolapse without mention of vaginal wall prolapse     Past Surgical History:  Procedure Laterality Date  . CARDIOVERSION N/A 11/05/2014   Procedure: CARDIOVERSION;  Surgeon: Candee Furbish, MD;  Location: [facility] ENDOSCOPY;  Service: Cardiovascular;  Laterality: N/A;  . cataract surgery  08/2015  . CHOLECYSTECTOMY    . corrective eye surgery     as a child  . IVD removed    . OPEN REDUCTION INTERNAL FIXATION (ORIF) DISTAL RADIAL FRACTURE Right 08/29/2014   Procedure: OPEN REDUCTION INTERNAL FIXATION (ORIF) DISTAL RADIAL FRACTURE;  Surgeon: Marianna Payment, MD;  Location: [place];  Service: Orthopedics;  Laterality: Right;  . RF ablation PSVT     summer '10  . stress cardiolite  08/05/93  . TONSILLECTOMY    . TOTAL HIP ARTHROPLASTY Right 08/29/2014   Procedure: Right Hip Hemi Arthroplasty;  Surgeon: Marianna Payment, MD;  Location: [place];  Service: Orthopedics;  Laterality: Right;  Hip procedure 1st wants Peg Board, [employer], [place].      Social History:  The patient  reports that she quit smoking about 60 years ago. She has never used smokeless tobacco. She reports that she  does not drink alcohol or use drugs.   Family History:  The patient's family history includes Arthritis in her father; Coronary artery disease in her brother; Heart attack in her brother; Heart disease in her brother; Hyperlipidemia in her sister; Hypertension in her brother and sister; Mental illness in her father.    ROS:  Please see the history of present illness. All other systems are reviewed and  Negative to the above problem except as noted.    PHYSICAL EXAM: VS:  BP 136/72   Pulse 80   Ht 5' 6.5" (1.689 m)   Wt 109 lb 1.9 oz (49.5 kg)   SpO2 97%   BMI 17.35 kg/m   GEN: Well nourished, well developed, in no acute distress HEENT: normal Neck: no JVD, carotid bruits, or  masses Cardiac: RRR; no murmurs, rubs, or gallops,no edema  Respiratory:  clear to auscultation bilaterally, normal work of breathing GI: soft, nontender, nondistended, + BS  No hepatomegaly  MS: no deformity Moving all extremities   Skin: warm and dry, no rash Neuro:  Strength and sensation are intact Psych: euthymic mood, full affect   EKG:  EKG is not  ordered today.   Lipid Panel    Component Value Date/Time   CHOL 132 10/30/2015 0931   TRIG 71 10/30/2015 0931   TRIG 141 10/25/2006 0906   HDL 33 (L) 10/30/2015 0931   CHOLHDL 4.0 10/30/2015 0931   VLDL 14 10/30/2015 0931   LDLCALC 85 10/30/2015 0931   LDLDIRECT 175.3 01/19/2008 1216      Wt Readings from Last 3 Encounters:  09/13/16 109 lb 1.9 oz (49.5 kg)  08/06/16 114 lb (51.7 kg)  05/05/16 117 lb 6.4 oz (53.3 kg)      ASSESSMENT AND PLAN:  1  HTN  BP is under better control  I would keep on same meds    2    Atrial fib  Keep on same meds   Current medicines are reviewed at length with the patient today.  The patient does not have concerns regarding medicines.  Signed, Dorris Carnes, MD  09/13/2016 3:38 PM    [address], [place], [place]  [address] Phone: [phone]; Fax: [phone]

## 2016-09-16 ENCOUNTER — Other Ambulatory Visit: Payer: Self-pay | Admitting: Internal Medicine

## 2016-09-16 NOTE — Telephone Encounter (Signed)
faxed

## 2016-09-16 NOTE — Telephone Encounter (Signed)
Done Done hardcopy to [facility]

## 2016-10-04 DIAGNOSIS — H52222 Regular astigmatism, left eye: Secondary | ICD-10-CM | POA: Diagnosis not present

## 2016-10-04 DIAGNOSIS — H40013 Open angle with borderline findings, low risk, bilateral: Secondary | ICD-10-CM | POA: Diagnosis not present

## 2016-10-04 NOTE — Progress Notes (Addendum)
Subjective:   Kayla Kent is a 82 y.o. female who presents for Medicare Annual (Subsequent) preventive examination. She is accompanied by her son, Eulas Post.   The Patient was informed that the wellness visit is to identify future health risk and educate and initiate measures that can reduce risk for increased disease through the lifespan.   Describes health as fair, good or great? "great"  Review of Systems:  No ROS.  Medicare Wellness Visit.  Cardiac Risk Factors include: advanced age (>51mn, >>26women);hypertension;dyslipidemia   Sleep patterns: doesn't sleep well. "can't turn off my mind". Takes Ambien or Xanax.  Home Safety/Smoke Alarms:  Smoke detectors and security in place.  Living environment; residence and Firearm Safety: Lives alone with dog in single story home. Safety bar and seat in bathtub. Firearms locked away. Wears safety alert necklace.  Seat Belt Safety/Bike Helmet: Wears seat belt.    Counseling:   Eye Exam-Last exam 10/04/16, [facility], [facility]   Female:   Pap-N/A      Mammo-12/17/2011, negative. Declines further testing.       Dexa scan-Declines scan.        CCS-cologuard ordered.      Objective:     Vitals: BP (!) 136/58 (BP Location: Left Arm, Patient Position: Sitting, Cuff Size: Normal)   Pulse 78   Resp 18   Ht '5\' 7"'$  (1.702 m)   Wt 114 lb (51.7 kg)   SpO2 98%   BMI 17.85 kg/m   Body mass index is 17.85 kg/m.   Tobacco History  Smoking Status  . Former Smoker  . Quit date: 12/08/1955  Smokeless Tobacco  . Never Used    Comment: quit in 1995      Counseling given: No   Past Medical History:  Diagnosis Date  . Allergic rhinitis   . Anxiety   . HLD (hyperlipidemia)   . HTN (hypertension)   . Intolerance of drug    orthostatic  . Paroxysmal supraventricular tachycardia ([place]   . PVD (peripheral vascular disease) ([facility]   . Syncope and collapse   . Uterine prolapse  without mention of vaginal wall prolapse    Past Surgical History:  Procedure Laterality Date  . CARDIOVERSION N/A 11/05/2014   Procedure: CARDIOVERSION;  Surgeon: MCandee Furbish MD;  [facility];  Service: Cardiovascular;  Laterality: N/A;  . cataract surgery  08/2015  . CHOLECYSTECTOMY    . corrective eye surgery     as a child  . IVD removed    . OPEN REDUCTION INTERNAL FIXATION (ORIF) DISTAL RADIAL FRACTURE Right 08/29/2014   Procedure: OPEN REDUCTION INTERNAL FIXATION (ORIF) DISTAL RADIAL FRACTURE;  Surgeon: NMarianna Payment MD;  [place]  Service: Orthopedics;  Laterality: Right;  . RF ablation PSVT     summer '10  . stress cardiolite  08/05/93  . TONSILLECTOMY    . TOTAL HIP ARTHROPLASTY Right 08/29/2014   Procedure: Right Hip Hemi Arthroplasty;  Surgeon: NMarianna Payment MD;  [place] [facility]  Service: Orthopedics;  Laterality: Right;  Hip procedure 1st wants Peg Board, [employer] [place].    Family History  Problem Relation Age of Onset  . Mental illness Father     suicide  . Arthritis Father   . Hyperlipidemia Sister   . Hypertension Sister   . Heart disease Brother     CAD/MI  . Hypertension Brother   . Coronary artery disease Brother   .  Hypertension      family hx  . Heart attack Brother   . Colon cancer Neg Hx   . Breast cancer Neg Hx   . Diabetes Neg Hx   . Stroke Neg Hx    History  Sexual Activity  . Sexual activity: No    Outpatient Encounter Prescriptions as of 10/05/2016  Medication Sig  . ALPRAZolam (XANAX) 0.25 MG tablet TAKE ONE TABLET BY MOUTH THREE TIMES DAILY  . amLODipine (NORVASC) 2.5 MG tablet Take 1 tablet (2.5 mg total) by mouth daily.  Marland Kitchen apixaban (ELIQUIS) 2.5 MG TABS tablet Take 1 tablet (2.5 mg total) by mouth 2 (two) times daily.  Marland Kitchen atorvastatin (LIPITOR) 10 MG tablet TAKE ONE TABLET BY MOUTH ONCE DAILY AT 6 PM  . diltiazem (DILACOR XR) 180 MG 24 hr capsule TAKE ONE CAPSULE BY MOUTH ONCE DAILY  .  levETIRAcetam (KEPPRA) 250 MG tablet TAKE ONE TABLET BY MOUTH TWICE DAILY  . zolpidem (AMBIEN) 5 MG tablet TAKE ONE TABLET BY MOUTH AT BEDTIME AS NEEDED FOR INSOMNIA   No facility-administered encounter medications on file as of 10/05/2016.     Activities of Daily Living In your present state of health, do you have any difficulty performing the following activities: 10/05/2016  Hearing? N  Vision? N  Difficulty concentrating or making decisions? N  Walking or climbing stairs? N  Dressing or bathing? N  Doing errands, shopping? N  Preparing Food and eating ? N  Using the Toilet? N  In the past six months, have you accidently leaked urine? N  Do you have problems with loss of bowel control? N  Managing your Medications? N  Managing your Finances? N  Housekeeping or managing your Housekeeping? N  Some recent data might be hidden    Patient Care Team: Biagio Borg, MD as PCP - General (Internal Medicine) Fay Records, MD as Consulting Physician (Cardiology) Leandrew Koyanagi, MD as Attending Physician (Orthopedic Surgery) Larey Seat, MD as Consulting Physician (Neurology) Nat Christen, MD as Attending Physician (Optometry) [facility] (Dentistry)    Assessment:    Physical assessment deferred to PCP.  Exercise Activities and Dietary recommendations Current Exercise Habits: Structured exercise class, Type of exercise: strength training/weights;yoga;exercise ball;stretching;walking, Time (Minutes): 45, Frequency (Times/Week): 6, Weekly Exercise (Minutes/Week): 270, Intensity: Moderate, Exercise limited by: None identified   Diet (meal preparation, eat out, water intake, caffeinated beverages, dairy products, fruits and vegetables): Drinks water only.   Breakfast: boiled egg Lunch: scrambled egg biscuit, jelly Dinner: tuna, salmon, vegetables. Little red meat/beef.   Encouraged to continue healthy food choices and activity level.   Goals      Patient  Stated   . <enter goal here> (pt-stated)          Maintain current health, remain active and keep brain active.       Fall Risk Fall Risk  10/05/2016 05/05/2016 05/05/2016 04/22/2015 02/08/2014  Falls in the past year? No No Yes Yes No  Number falls in past yr: - - 1 1 -  Injury with Fall? - - Yes Yes -   Depression Screen PHQ 2/9 Scores 10/05/2016 04/22/2015 02/08/2014  PHQ - 2 Score 0 0 0     Cognitive Function       Ad8 score reviewed for issues:  Issues making decisions:no  Less interest in hobbies / activities:no  Repeats questions, stories (family complaining):no  Trouble using ordinary gadgets (microwave, computer, phone):no  Forgets the month or year: no  Mismanaging finances: no  Remembering appts:no  Daily problems with thinking and/or memory:no Ad8 score is=0     Immunization History  Administered Date(s) Administered  . Influenza Whole 08/02/2008, 07/07/2009, 06/30/2010  . Influenza, High Dose Seasonal PF 07/13/2014  . Influenza-Unspecified 07/21/2012, 07/19/2015  . Pneumococcal Conjugate-13 02/08/2014  . Pneumococcal Polysaccharide-23 07/07/2009  . Td 12/17/2008   Screening Tests Health Maintenance  Topic Date Due  . DEXA SCAN  02/15/1999  . INFLUENZA VACCINE  05/18/2016  . TETANUS/TDAP  12/18/2018  . ZOSTAVAX  Addressed  . PNA vac Low Risk Adult  Completed      Plan:     Continue to eat heart healthy diet (full of fruits, vegetables, whole grains, lean protein, water--limit salt, fat, and sugar intake) and increase physical activity as tolerated.  Continue doing brain stimulating activities (puzzles, reading, adult coloring books, staying active) to keep memory sharp.  Bring a copy of your advance directives to your next office visit.  **Pt concerns: Bilateral ankle and foot swelling and bruising x 2 weeks. No recent injuries or falls. Patient and son requested assessment from PCP. Acute appointment scheduled for 10/06/16.     During the  course of the visit the patient was educated and counseled about the following appropriate screening and preventive services:   Vaccines to include Pneumoccal, Influenza, Hepatitis B, Td, Zostavax, HCV  Cardiovascular Disease  Colorectal cancer screening  Bone density screening  Diabetes screening  Glaucoma screening  Mammography/PAP  Nutrition counseling   Patient Instructions (the written plan) was given to the patient.   Gerilyn Nestle, RN  10/05/2016  Medical screening examination/treatment/procedure(s) were performed by non-physician practitioner and as supervising physician I was immediately available for consultation/collaboration. I agree with above. Cathlean Cower, MD

## 2016-10-04 NOTE — Progress Notes (Signed)
Pre visit review using our clinic review tool, if applicable. No additional management support is needed unless otherwise documented below in the visit note. 

## 2016-10-05 ENCOUNTER — Ambulatory Visit (INDEPENDENT_AMBULATORY_CARE_PROVIDER_SITE_OTHER): Payer: Medicare Other

## 2016-10-05 VITALS — BP 136/58 | HR 78 | Resp 18 | Ht 67.0 in | Wt 114.0 lb

## 2016-10-05 DIAGNOSIS — Z0000 Encounter for general adult medical examination without abnormal findings: Secondary | ICD-10-CM

## 2016-10-05 NOTE — Patient Instructions (Addendum)
Continue to eat heart healthy diet (full of fruits, vegetables, whole grains, lean protein, water--limit salt, fat, and sugar intake) and increase physical activity as tolerated.  Continue doing brain stimulating activities (puzzles, reading, adult coloring books, staying active) to keep memory sharp.  Bring a copy of your advance directives to your next office visit.   Fall Prevention in the Home Introduction Falls can cause injuries. They can happen to people of all ages. There are many things you can do to make your home safe and to help prevent falls. What can I do on the outside of my home?  Regularly fix the edges of walkways and driveways and fix any cracks.  Remove anything that might make you trip as you walk through a door, such as a raised step or threshold.  Trim any bushes or trees on the path to your home.  Use bright outdoor lighting.  Clear any walking paths of anything that might make someone trip, such as rocks or tools.  Regularly check to see if handrails are loose or broken. Make sure that both sides of any steps have handrails.  Any raised decks and porches should have guardrails on the edges.  Have any leaves, snow, or ice cleared regularly.  Use sand or salt on walking paths during winter.  Clean up any spills in your garage right away. This includes oil or grease spills. What can I do in the bathroom?  Use night lights.  Install grab bars by the toilet and in the tub and shower. Do not use towel bars as grab bars.  Use non-skid mats or decals in the tub or shower.  If you need to sit down in the shower, use a plastic, non-slip stool.  Keep the floor dry. Clean up any water that spills on the floor as soon as it happens.  Remove soap buildup in the tub or shower regularly.  Attach bath mats securely with double-sided non-slip rug tape.  Do not have throw rugs and other things on the floor that can make you trip. What can I do in the  bedroom?  Use night lights.  Make sure that you have a light by your bed that is easy to reach.  Do not use any sheets or blankets that are too big for your bed. They should not hang down onto the floor.  Have a firm chair that has side arms. You can use this for support while you get dressed.  Do not have throw rugs and other things on the floor that can make you trip. What can I do in the kitchen?  Clean up any spills right away.  Avoid walking on wet floors.  Keep items that you use a lot in easy-to-reach places.  If you need to reach something above you, use a strong step stool that has a grab bar.  Keep electrical cords out of the way.  Do not use floor polish or wax that makes floors slippery. If you must use wax, use non-skid floor wax.  Do not have throw rugs and other things on the floor that can make you trip. What can I do with my stairs?  Do not leave any items on the stairs.  Make sure that there are handrails on both sides of the stairs and use them. Fix handrails that are broken or loose. Make sure that handrails are as long as the stairways.  Check any carpeting to make sure that it is firmly attached to the stairs.  Fix any carpet that is loose or worn.  Avoid having throw rugs at the top or bottom of the stairs. If you do have throw rugs, attach them to the floor with carpet tape.  Make sure that you have a light switch at the top of the stairs and the bottom of the stairs. If you do not have them, ask someone to add them for you. What else can I do to help prevent falls?  Wear shoes that:  Do not have high heels.  Have rubber bottoms.  Are comfortable and fit you well.  Are closed at the toe. Do not wear sandals.  If you use a stepladder:  Make sure that it is fully opened. Do not climb a closed stepladder.  Make sure that both sides of the stepladder are locked into place.  Ask someone to hold it for you, if possible.  Clearly mark and make  sure that you can see:  Any grab bars or handrails.  First and last steps.  Where the edge of each step is.  Use tools that help you move around (mobility aids) if they are needed. These include:  Canes.  Walkers.  Scooters.  Crutches.  Turn on the lights when you go into a dark area. Replace any light bulbs as soon as they burn out.  Set up your furniture so you have a clear path. Avoid moving your furniture around.  If any of your floors are uneven, fix them.  If there are any pets around you, be aware of where they are.  Review your medicines with your doctor. Some medicines can make you feel dizzy. This can increase your chance of falling. Ask your doctor what other things that you can do to help prevent falls. This information is not intended to replace advice given to you by your health care provider. Make sure you discuss any questions you have with your health care provider. Document Released: 07/31/2009 Document Revised: 03/11/2016 Document Reviewed: 11/08/2014  2017 Elsevier  Health Maintenance, Female Introduction Adopting a healthy lifestyle and getting preventive care can go a long way to promote health and wellness. Talk with your health care provider about what schedule of regular examinations is right for you. This is a good chance for you to check in with your provider about disease prevention and staying healthy. In between checkups, there are plenty of things you can do on your own. Experts have done a lot of research about which lifestyle changes and preventive measures are most likely to keep you healthy. Ask your health care provider for more information. Weight and diet Eat a healthy diet  Be sure to include plenty of vegetables, fruits, low-fat dairy products, and lean protein.  Do not eat a lot of foods high in solid fats, added sugars, or salt.  Get regular exercise. This is one of the most important things you can do for your health.  Most  adults should exercise for at least 150 minutes each week. The exercise should increase your heart rate and make you sweat (moderate-intensity exercise).  Most adults should also do strengthening exercises at least twice a week. This is in addition to the moderate-intensity exercise. Maintain a healthy weight  Body mass index (BMI) is a measurement that can be used to identify possible weight problems. It estimates body fat based on height and weight. Your health care provider can help determine your BMI and help you achieve or maintain a healthy weight.  For females 65  females 20 years of age and older:  A BMI below 18.5 is considered underweight.  A BMI of 18.5 to 24.9 is normal.  A BMI of 25 to 29.9 is considered overweight.  A BMI of 30 and above is considered obese. Watch levels of cholesterol and blood lipids  You should start having your blood tested for lipids and cholesterol at 80 years of age, then have this test every 5 years.  You may need to have your cholesterol levels checked more often if:  Your lipid or cholesterol levels are high.  You are older than 80 years of age.  You are at high risk for heart disease. Cancer screening Lung Cancer  Lung cancer screening is recommended for adults 55-80 years old who are at high risk for lung cancer because of a history of smoking.  A yearly low-dose CT scan of the lungs is recommended for people who:  Currently smoke.  Have quit within the past 15 years.  Have at least a 30-pack-year history of smoking. A pack year is smoking an average of one pack of cigarettes a day for 1 year.  Yearly screening should continue until it has been 15 years since you quit.  Yearly screening should stop if you develop a health problem that would prevent you from having lung cancer treatment. Breast Cancer  Practice breast self-awareness. This means understanding how your breasts normally appear and feel.  It also means doing regular breast  self-exams. Let your health care provider know about any changes, no matter how small.  If you are in your 20s or 30s, you should have a clinical breast exam (CBE) by a health care provider every 1-3 years as part of a regular health exam.  If you are 40 or older, have a CBE every year. Also consider having a breast X-ray (mammogram) every year.  If you have a family history of breast cancer, talk to your health care provider about genetic screening.  If you are at high risk for breast cancer, talk to your health care provider about having an MRI and a mammogram every year.  Breast cancer gene (BRCA) assessment is recommended for women who have family members with BRCA-related cancers. BRCA-related cancers include:  Breast.  Ovarian.  Tubal.  Peritoneal cancers.  Results of the assessment will determine the need for genetic counseling and BRCA1 and BRCA2 testing. Cervical Cancer  Your health care provider may recommend that you be screened regularly for cancer of the pelvic organs (ovaries, uterus, and vagina). This screening involves a pelvic examination, including checking for microscopic changes to the surface of your cervix (Pap test). You may be encouraged to have this screening done every 3 years, beginning at age 21.  For women ages 30-65, health care providers may recommend pelvic exams and Pap testing every 3 years, or they may recommend the Pap and pelvic exam, combined with testing for human papilloma virus (HPV), every 5 years. Some types of HPV increase your risk of cervical cancer. Testing for HPV may also be done on women of any age with unclear Pap test results.  Other health care providers may not recommend any screening for nonpregnant women who are considered low risk for pelvic cancer and who do not have symptoms. Ask your health care provider if a screening pelvic exam is right for you.  If you have had past treatment for cervical cancer or a condition that could lead  to cancer, you need Pap tests and screening for   cancer for at least 20 years after your treatment. If Pap tests have been discontinued, your risk factors (such as having a new sexual partner) need to be reassessed to determine if screening should resume. Some women have medical problems that increase the chance of getting cervical cancer. In these cases, your health care provider may recommend more frequent screening and Pap tests. Colorectal Cancer  This type of cancer can be detected and often prevented.  Routine colorectal cancer screening usually begins at 80 years of age and continues through 80 years of age.  Your health care provider may recommend screening at an earlier age if you have risk factors for colon cancer.  Your health care provider may also recommend using home test kits to check for hidden blood in the stool.  A small camera at the end of a tube can be used to examine your colon directly (sigmoidoscopy or colonoscopy). This is done to check for the earliest forms of colorectal cancer.  Routine screening usually begins at age 50.  Direct examination of the colon should be repeated every 5-10 years through 80 years of age. However, you may need to be screened more often if early forms of precancerous polyps or small growths are found. Skin Cancer  Check your skin from head to toe regularly.  Tell your health care provider about any new moles or changes in moles, especially if there is a change in a mole's shape or color.  Also tell your health care provider if you have a mole that is larger than the size of a pencil eraser.  Always use sunscreen. Apply sunscreen liberally and repeatedly throughout the day.  Protect yourself by wearing long sleeves, pants, a wide-brimmed hat, and sunglasses whenever you are outside. Heart disease, diabetes, and high blood pressure  High blood pressure causes heart disease and increases the risk of stroke. High blood pressure is more  likely to develop in:  People who have blood pressure in the high end of the normal range (130-139/85-89 mm Hg).  People who are overweight or obese.  People who are African American.  If you are 18-39 years of age, have your blood pressure checked every 3-5 years. If you are 40 years of age or older, have your blood pressure checked every year. You should have your blood pressure measured twice-once when you are at a hospital or clinic, and once when you are not at a hospital or clinic. Record the average of the two measurements. To check your blood pressure when you are not at a hospital or clinic, you can use:  An automated blood pressure machine at a pharmacy.  A home blood pressure monitor.  If you are between 55 years and 79 years old, ask your health care provider if you should take aspirin to prevent strokes.  Have regular diabetes screenings. This involves taking a blood sample to check your fasting blood sugar level.  If you are at a normal weight and have a low risk for diabetes, have this test once every three years after 80 years of age.  If you are overweight and have a high risk for diabetes, consider being tested at a younger age or more often. Preventing infection Hepatitis B  If you have a higher risk for hepatitis B, you should be screened for this virus. You are considered at high risk for hepatitis B if:  You were born in a country where hepatitis B is common. Ask your health care provider which countries   high risk.  Your parents were born in a high-risk country, and you have not been immunized against hepatitis B (hepatitis B vaccine).  You have HIV or AIDS.  You use needles to inject street drugs.  You live with someone who has hepatitis B.  You have had sex with someone who has hepatitis B.  You get hemodialysis treatment.  You take certain medicines for conditions, including cancer, organ transplantation, and autoimmune  conditions. Hepatitis C  Blood testing is recommended for:  Everyone born from [birth year] through 1963.  Anyone with known risk factors for hepatitis C. Sexually transmitted infections (STIs)  You should be screened for sexually transmitted infections (STIs) including gonorrhea and chlamydia if:  You are sexually active and are younger than 80 years of age.  You are older than 80 years of age and your health care provider tells you that you are at risk for this type of infection.  Your sexual activity has changed since you were last screened and you are at an increased risk for chlamydia or gonorrhea. Ask your health care provider if you are at risk.  If you do not have HIV, but are at risk, it may be recommended that you take a prescription medicine daily to prevent HIV infection. This is called pre-exposure prophylaxis (PrEP). You are considered at risk if:  You are sexually active and do not regularly use condoms or know the HIV status of your partner(s).  You take drugs by injection.  You are sexually active with a partner who has HIV. Talk with your health care provider about whether you are at high risk of being infected with HIV. If you choose to begin PrEP, you should first be tested for HIV. You should then be tested every 3 months for as long as you are taking PrEP. Pregnancy  If you are premenopausal and you may become pregnant, ask your health care provider about preconception counseling.  If you may become pregnant, take 400 to 800 micrograms (mcg) of folic acid every day.  If you want to prevent pregnancy, talk to your health care provider about birth control (contraception). Osteoporosis and menopause  Osteoporosis is a disease in which the bones lose minerals and strength with aging. This can result in serious bone fractures. Your risk for osteoporosis can be identified using a bone density scan.  If you are 69 years of age or older, or if you are at risk for  osteoporosis and fractures, ask your health care provider if you should be screened.  Ask your health care provider whether you should take a calcium or vitamin D supplement to lower your risk for osteoporosis.  Menopause may have certain physical symptoms and risks.  Hormone replacement therapy may reduce some of these symptoms and risks. Talk to your health care provider about whether hormone replacement therapy is right for you. Follow these instructions at home:  Schedule regular health, dental, and eye exams.  Stay current with your immunizations.  Do not use any tobacco products including cigarettes, chewing tobacco, or electronic cigarettes.  If you are pregnant, do not drink alcohol.  If you are breastfeeding, limit how much and how often you drink alcohol.  Limit alcohol intake to no more than 1 drink per day for nonpregnant women. One drink equals 12 ounces of beer, 5 ounces of wine, or 1 ounces of hard liquor.  Do not use street drugs.  Do not share needles.  Ask your health care provider for help if you  information about quitting drugs.  Tell your health care provider if you often feel depressed.  Tell your health care provider if you have ever been abused or do not feel safe at home. This information is not intended to replace advice given to you by your health care provider. Make sure you discuss any questions you have with your health care provider. Document Released: 04/19/2011 Document Revised: 03/11/2016 Document Reviewed: 07/08/2015  2017 Elsevier  

## 2016-10-06 ENCOUNTER — Encounter: Payer: Self-pay | Admitting: Internal Medicine

## 2016-10-06 ENCOUNTER — Ambulatory Visit (INDEPENDENT_AMBULATORY_CARE_PROVIDER_SITE_OTHER): Payer: Medicare Other | Admitting: Internal Medicine

## 2016-10-06 ENCOUNTER — Other Ambulatory Visit (INDEPENDENT_AMBULATORY_CARE_PROVIDER_SITE_OTHER): Payer: Medicare Other

## 2016-10-06 VITALS — BP 118/70 | HR 74 | Temp 98.6°F | Resp 20 | Wt 113.0 lb

## 2016-10-06 DIAGNOSIS — I509 Heart failure, unspecified: Secondary | ICD-10-CM

## 2016-10-06 DIAGNOSIS — R300 Dysuria: Secondary | ICD-10-CM | POA: Diagnosis not present

## 2016-10-06 DIAGNOSIS — R600 Localized edema: Secondary | ICD-10-CM | POA: Diagnosis not present

## 2016-10-06 DIAGNOSIS — I480 Paroxysmal atrial fibrillation: Secondary | ICD-10-CM | POA: Diagnosis not present

## 2016-10-06 LAB — HEPATIC FUNCTION PANEL
ALT: 28 U/L (ref 0–35)
AST: 27 U/L (ref 0–37)
Albumin: 3.7 g/dL (ref 3.5–5.2)
Alkaline Phosphatase: 93 U/L (ref 39–117)
BILIRUBIN DIRECT: 0.1 mg/dL (ref 0.0–0.3)
TOTAL PROTEIN: 7.4 g/dL (ref 6.0–8.3)
Total Bilirubin: 0.5 mg/dL (ref 0.2–1.2)

## 2016-10-06 LAB — CBC WITH DIFFERENTIAL/PLATELET
BASOS ABS: 0 10*3/uL (ref 0.0–0.1)
Basophils Relative: 0.5 % (ref 0.0–3.0)
EOS ABS: 0.1 10*3/uL (ref 0.0–0.7)
Eosinophils Relative: 1.2 % (ref 0.0–5.0)
HCT: 38 % (ref 36.0–46.0)
Hemoglobin: 12.4 g/dL (ref 12.0–15.0)
LYMPHS ABS: 2 10*3/uL (ref 0.7–4.0)
Lymphocytes Relative: 30.3 % (ref 12.0–46.0)
MCHC: 32.6 g/dL (ref 30.0–36.0)
MCV: 89.1 fl (ref 78.0–100.0)
MONO ABS: 0.6 10*3/uL (ref 0.1–1.0)
Monocytes Relative: 9.4 % (ref 3.0–12.0)
NEUTROS ABS: 3.8 10*3/uL (ref 1.4–7.7)
NEUTROS PCT: 58.6 % (ref 43.0–77.0)
PLATELETS: 228 10*3/uL (ref 150.0–400.0)
RBC: 4.26 Mil/uL (ref 3.87–5.11)
RDW: 15.2 % (ref 11.5–15.5)
WBC: 6.5 10*3/uL (ref 4.0–10.5)

## 2016-10-06 LAB — BASIC METABOLIC PANEL
BUN: 24 mg/dL — AB (ref 6–23)
CO2: 29 meq/L (ref 19–32)
CREATININE: 1.06 mg/dL (ref 0.40–1.20)
Calcium: 8.9 mg/dL (ref 8.4–10.5)
Chloride: 106 mEq/L (ref 96–112)
GFR: 52.67 mL/min — ABNORMAL LOW (ref >60.00)
Glucose, Bld: 97 mg/dL (ref 70–99)
POTASSIUM: 3.6 meq/L (ref 3.5–5.1)
Sodium: 142 mEq/L (ref 135–145)

## 2016-10-06 LAB — TSH: TSH: 2.29 u[IU]/mL (ref 0.35–4.50)

## 2016-10-06 MED ORDER — ZOLPIDEM TARTRATE 5 MG PO TABS: 5.0000 mg | ORAL_TABLET | Freq: Every evening | ORAL | 5 refills | Status: DC | PRN

## 2016-10-06 NOTE — Progress Notes (Signed)
Pre visit review using our clinic review tool, if applicable. No additional management support is needed unless otherwise documented below in the visit note. 

## 2016-10-06 NOTE — Patient Instructions (Addendum)
Your EKG was OK to day - and consistent with afib with controlled heart rate  Please continue all other medications as before, including the ambien  Please have the pharmacy call with any other refills you may need.  Please keep your appointments with your specialists as you may have planned  Please go to the LAB in the Basement (turn left off the elevator) for the tests to be done today  You will be contacted by phone if any changes need to be made immediately.  Otherwise, you will receive a letter about your results with an explanation, but please check with MyChart first.  Please return in 3 months, or sooner if needed

## 2016-10-06 NOTE — Progress Notes (Signed)
Subjective:    Patient ID: Kayla Kent, female    DOB: [birth year], 82 y.o.   MRN: [id number]  HPI  Here with son who is EMT, c/o 2 wks onset mild worsening bipedal edema, Pt denies chest pain, increased sob or doe, wheezing, orthopnea, PND, palpitations, dizziness or syncope.  Pt denies new neurological symptoms such as new headache, or facial or extremity weakness or numbness   Pt denies polydipsia, polyuria.  Also with 2 days onset dysuria and freq, but .Denies urinary symptoms such as  urgency, flank pain, hematuria or n/v, fever, chills.   Past Medical History:  Diagnosis Date  . Allergic rhinitis   . Anxiety   . HLD (hyperlipidemia)   . HTN (hypertension)   . Intolerance of drug    orthostatic  . Paroxysmal supraventricular tachycardia ([place])   . PVD (peripheral vascular disease) ([place])   . Syncope and collapse   . Uterine prolapse without mention of vaginal wall prolapse    Past Surgical History:  Procedure Laterality Date  . CARDIOVERSION N/A 11/05/2014   Procedure: CARDIOVERSION;  Surgeon: Candee Furbish, MD;  Location: [facility] ENDOSCOPY;  Service: Cardiovascular;  Laterality: N/A;  . cataract surgery  08/2015  . CHOLECYSTECTOMY    . corrective eye surgery     as a child  . IVD removed    . OPEN REDUCTION INTERNAL FIXATION (ORIF) DISTAL RADIAL FRACTURE Right 08/29/2014   Procedure: OPEN REDUCTION INTERNAL FIXATION (ORIF) DISTAL RADIAL FRACTURE;  Surgeon: Marianna Payment, MD;  Location: [facility];  Service: Orthopedics;  Laterality: Right;  . RF ablation PSVT     summer '10  . stress cardiolite  08/05/93  . TONSILLECTOMY    . TOTAL HIP ARTHROPLASTY Right 08/29/2014   Procedure: Right Hip Hemi Arthroplasty;  Surgeon: Marianna Payment, MD;  Location: [place];  Service: Orthopedics;  Laterality: Right;  Hip procedure 1st wants [facility], [employer], [place].     reports that she quit smoking about 60 years ago. She has never used smokeless tobacco. She reports that she does not  drink alcohol or use drugs. family history includes Arthritis in her father; Coronary artery disease in her brother; Heart attack in her brother; Heart disease in her brother; Hyperlipidemia in her sister; Hypertension in her brother and sister; Mental illness in her father. Allergies  Allergen Reactions  . Chocolate Hives  . Codeine Other (See Comments)    Patient states she acts crazy  . Diazepam Other (See Comments)    Patient states she acts crazy.  . Fruit & Vegetable Daily [Nutritional Supplements] Hives and Swelling    peaches  . Iohexol Hives     Code: HIVES, Desc: pt gets 13 hr pre-meds, Onset Date: [id number]   . Latex Hives  . Peach [Prunus Persica] Hives  . Peanut-Containing Drug Products Hives and Swelling  . Penicillins Hives, Itching and Swelling  . Strawberry Extract Hives  . Sulfonamide Derivatives Hives    nausea  . Wheat Shortness Of Breath    Shortness of breath  . Wheat Bran Shortness Of Breath  . Sulfamethoxazole Hives  . Crestor [Rosuvastatin Calcium] Rash  . Iodine Rash   Current Outpatient Prescriptions on File Prior to Visit  Medication Sig Dispense Refill  . ALPRAZolam (XANAX) 0.25 MG tablet TAKE ONE TABLET BY MOUTH THREE TIMES DAILY 90 tablet 0  . amLODipine (NORVASC) 2.5 MG tablet Take 1 tablet (2.5 mg total) by mouth daily. 30 tablet 6  .  atorvastatin (LIPITOR) 10 MG tablet TAKE ONE TABLET BY MOUTH ONCE DAILY AT 6 PM 30 tablet 10  . diltiazem (DILACOR XR) 180 MG 24 hr capsule TAKE ONE CAPSULE BY MOUTH ONCE DAILY 90 capsule 0  . levETIRAcetam (KEPPRA) 250 MG tablet TAKE ONE TABLET BY MOUTH TWICE DAILY 120 tablet 5   No current facility-administered medications on file prior to visit.    Review of Systems  Constitutional: Negative for unusual diaphoresis or night sweats HENT: Negative for ear swelling or discharge Eyes: Negative for worsening visual haziness  Respiratory: Negative for choking and stridor.   Gastrointestinal: Negative for distension  or worsening eructation Genitourinary: Negative for retention or change in urine volume.  Musculoskeletal: Negative for other MSK pain or swelling Skin: Negative for color change and worsening wound Neurological: Negative for tremors and numbness other than noted  Psychiatric/Behavioral: Negative for decreased concentration or agitation other than above   All other system neg per pt    Objective:   Physical Exam BP 118/70   Pulse 74   Temp 98.6 F (37 C) (Oral)   Resp 20   Wt 113 lb (51.3 kg)   SpO2 98%   BMI 17.70 kg/m  VS noted,  Constitutional: Pt appears in no apparent distress HENT: Head: NCAT.  Right Ear: External ear normal.  Left Ear: External ear normal.  Eyes: . Pupils are equal, round, and reactive to light. Conjunctivae and EOM are normal Neck: Normal range of motion. Neck supple.  Cardiovascular: Normal rate and irregular rhythm.   Pulmonary/Chest: Effort normal and breath sounds without rales or wheezing.  Abd:  Soft, NT, ND, + BS, no flank tender, benign exam Neurological: Pt is alert. Not confused , motor grossly intact Skin: Skin is warm. No rash, with bipedal LE edema 1+ to ankles with TNTC severe varicosity Psychiatric: Pt behavior is normal. No agitation.   ECG today I have personally interpreted Atrial fibrillation  -Old anteroseptal infarct.   -  Negative T-waves  -Possible  Anterolateral  ischemia.     Assessment & Plan:

## 2016-10-07 ENCOUNTER — Telehealth: Payer: Self-pay

## 2016-10-07 ENCOUNTER — Other Ambulatory Visit: Payer: Self-pay | Admitting: Internal Medicine

## 2016-10-07 ENCOUNTER — Encounter: Payer: Self-pay | Admitting: Internal Medicine

## 2016-10-07 DIAGNOSIS — R609 Edema, unspecified: Secondary | ICD-10-CM

## 2016-10-07 DIAGNOSIS — I5020 Unspecified systolic (congestive) heart failure: Secondary | ICD-10-CM

## 2016-10-07 LAB — URINALYSIS, ROUTINE W REFLEX MICROSCOPIC
Bilirubin Urine: NEGATIVE
HGB URINE DIPSTICK: NEGATIVE
Ketones, ur: NEGATIVE
LEUKOCYTES UA: NEGATIVE
Nitrite: NEGATIVE
PH: 6 (ref 5.0–8.0)
SPECIFIC GRAVITY, URINE: 1.025 (ref 1.000–1.030)
TOTAL PROTEIN, URINE-UPE24: NEGATIVE
URINE GLUCOSE: NEGATIVE
UROBILINOGEN UA: 0.2 (ref 0.0–1.0)

## 2016-10-07 NOTE — Telephone Encounter (Signed)
Please advise lab is unable to add on BNP

## 2016-10-07 NOTE — Telephone Encounter (Signed)
See letter sent

## 2016-10-08 ENCOUNTER — Other Ambulatory Visit: Payer: Self-pay | Admitting: Internal Medicine

## 2016-10-08 LAB — URINE CULTURE: Colony Count: >=100000

## 2016-10-08 MED ORDER — NITROFURANTOIN MONOHYD MACRO 100 MG PO CAPS: 100.0000 mg | ORAL_CAPSULE | Freq: Two times a day (BID) | ORAL | 0 refills | Status: DC

## 2016-10-10 DIAGNOSIS — R600 Localized edema: Secondary | ICD-10-CM | POA: Insufficient documentation

## 2016-10-10 NOTE — Assessment & Plan Note (Signed)
stable overall rate, recent data reviewed with pt, and pt to continue medical treatment as before,  to f/u any worsening symptoms or concerns'

## 2016-10-10 NOTE — Assessment & Plan Note (Signed)
C/w possible UTI, for UA, cx and follow with antibx pending results

## 2016-10-10 NOTE — Assessment & Plan Note (Signed)
2 wks mild worsening, with differential including exac of chronic diast chf, arrythymia, CKD worsening, amlodipine 2.5 mg use; doubt med compliance issue; for BNP, and labs as ordered; consider change amlod to HCT 12.5 for increased BNP,  to f/u any worsening symptoms or concerns

## 2016-10-12 ENCOUNTER — Telehealth: Payer: Self-pay | Admitting: Internal Medicine

## 2016-10-12 MED ORDER — LEVOFLOXACIN 250 MG PO TABS: 250.0000 mg | ORAL_TABLET | Freq: Every day | ORAL | 0 refills | Status: DC

## 2016-10-12 NOTE — Telephone Encounter (Signed)
Patient states she was prescribed nitrofurantoin and states she has had diarrhea and had felt like she is going to throw up.  Patient is requesting a different med.  Patient uses Paediatric nurse on [address].  Please follow up with patient in regard. Patient states her antibiotic of choice is cipro.

## 2016-10-12 NOTE — Telephone Encounter (Signed)
Ok to change to low dose levaquin - done erx

## 2016-10-22 ENCOUNTER — Other Ambulatory Visit (INDEPENDENT_AMBULATORY_CARE_PROVIDER_SITE_OTHER): Payer: Medicare Other

## 2016-10-22 ENCOUNTER — Encounter: Payer: Self-pay | Admitting: Internal Medicine

## 2016-10-22 ENCOUNTER — Ambulatory Visit (INDEPENDENT_AMBULATORY_CARE_PROVIDER_SITE_OTHER): Payer: Medicare Other | Admitting: Internal Medicine

## 2016-10-22 DIAGNOSIS — I5020 Unspecified systolic (congestive) heart failure: Secondary | ICD-10-CM

## 2016-10-22 DIAGNOSIS — J019 Acute sinusitis, unspecified: Secondary | ICD-10-CM | POA: Diagnosis not present

## 2016-10-22 DIAGNOSIS — I5032 Chronic diastolic (congestive) heart failure: Secondary | ICD-10-CM | POA: Diagnosis not present

## 2016-10-22 DIAGNOSIS — I10 Essential (primary) hypertension: Secondary | ICD-10-CM

## 2016-10-22 DIAGNOSIS — R609 Edema, unspecified: Secondary | ICD-10-CM | POA: Diagnosis not present

## 2016-10-22 LAB — BRAIN NATRIURETIC PEPTIDE: PRO B NATRI PEPTIDE: 410 pg/mL — AB (ref 0.0–100.0)

## 2016-10-22 MED ORDER — DOXYCYCLINE HYCLATE 100 MG PO TABS: 100.0000 mg | ORAL_TABLET | Freq: Two times a day (BID) | ORAL | 0 refills | Status: DC

## 2016-10-22 MED ORDER — ALPRAZOLAM 0.25 MG PO TABS: 0.2500 mg | ORAL_TABLET | Freq: Two times a day (BID) | ORAL | 3 refills | Status: DC | PRN

## 2016-10-22 NOTE — Progress Notes (Signed)
Subjective:    Patient ID: Kayla Kent, female    DOB: [birth year], 83 y.o.   MRN: [id number]  HPI   Here with 2-3 days acute onset fever, facial pain, pressure, headache, general weakness and malaise, and greenish d/c, with mild ST and cough, but pt denies chest pain, wheezing, increased sob or doe, orthopnea, PND, palpitations, dizziness or syncope.  Does have persistent pedal edema, asks for lab today.  Denies worsening depressive symptoms, suicidal ideation, or panic; has ongoing anxiety, overall stable. No other new history Past Medical History:  Diagnosis Date  . Allergic rhinitis   . Anxiety   . HLD (hyperlipidemia)   . HTN (hypertension)   . Intolerance of drug    orthostatic  . Paroxysmal supraventricular tachycardia ([place])   . PVD (peripheral vascular disease) ([place])   . Syncope and collapse   . Uterine prolapse without mention of vaginal wall prolapse    Past Surgical History:  Procedure Laterality Date  . CARDIOVERSION N/A 11/05/2014   Procedure: CARDIOVERSION;  Surgeon: Candee Furbish, MD;  Location[facility] ENDOSCOPY;  Service: Cardiovascular;  Laterality: N/A;  . cataract surgery  08/2015  . CHOLECYSTECTOMY    . corrective eye surgery     as a child  . IVD removed    . OPEN REDUCTION INTERNAL FIXATION (ORIF) DISTAL RADIAL FRACTURE Right 08/29/2014   Procedure: OPEN REDUCTION INTERNAL FIXATION (ORIF) DISTAL RADIAL FRACTURE;  Surgeon: Marianna Payment, MD;  Location: [place];  Service: Orthopedics;  Laterality: Right;  . RF ablation PSVT     summer '10  . stress cardiolite  08/05/93  . TONSILLECTOMY    . TOTAL HIP ARTHROPLASTY Right 08/29/2014   Procedure: Right Hip Hemi Arthroplasty;  Surgeon: Marianna Payment, MD;  Location: [place];  Service: Orthopedics;  Laterality: Right;  Hip procedure 1st wants Peg Board, [employer], [place].     reports that she quit smoking about 60 years ago. She has never used smokeless tobacco. She reports that she does not drink alcohol  or use drugs. family history includes Arthritis in her father; Coronary artery disease in her brother; Heart attack in her brother; Heart disease in her brother; Hyperlipidemia in her sister; Hypertension in her brother and sister; Mental illness in her father. Allergies  Allergen Reactions  . Chocolate Hives  . Codeine Other (See Comments)    Patient states she acts crazy  . Diazepam Other (See Comments)    Patient states she acts crazy.  . Fruit & Vegetable Daily [Nutritional Supplements] Hives and Swelling    peaches  . Iohexol Hives     Code: HIVES, Desc: pt gets 13 hr pre-meds, Onset Date: [id number]   . Latex Hives  . Peach [Prunus Persica] Hives  . Peanut-Containing Drug Products Hives and Swelling  . Penicillins Hives, Itching and Swelling  . Strawberry Extract Hives  . Sulfonamide Derivatives Hives    nausea  . Wheat Shortness Of Breath    Shortness of breath  . Wheat Bran Shortness Of Breath  . Sulfamethoxazole Hives  . Crestor [Rosuvastatin Calcium] Rash  . Iodine Rash   Current Outpatient Prescriptions on File Prior to Visit  Medication Sig Dispense Refill  . amLODipine (NORVASC) 2.5 MG tablet Take 1 tablet (2.5 mg total) by mouth daily. 30 tablet 6  . atorvastatin (LIPITOR) 10 MG tablet TAKE ONE TABLET BY MOUTH ONCE DAILY AT 6 PM 30 tablet 10  . diltiazem (DILACOR XR) 180 MG 24 hr capsule  TAKE ONE CAPSULE BY MOUTH ONCE DAILY 90 capsule 0  . ELIQUIS 2.5 MG TABS tablet TAKE ONE TABLET BY MOUTH TWICE DAILY 180 tablet 3  . levETIRAcetam (KEPPRA) 250 MG tablet TAKE ONE TABLET BY MOUTH TWICE DAILY 120 tablet 5  . zolpidem (AMBIEN) 5 MG tablet Take 1 tablet (5 mg total) by mouth at bedtime as needed. for insomnia 30 tablet 5   No current facility-administered medications on file prior to visit.    Review of Systems  Constitutional: Negative for unusual diaphoresis or night sweats HENT: Negative for ear swelling or discharge Eyes: Negative for worsening visual haziness    Respiratory: Negative for choking and stridor.   Gastrointestinal: Negative for distension or worsening eructation Genitourinary: Negative for retention or change in urine volume.  Musculoskeletal: Negative for other MSK pain or swelling Skin: Negative for color change and worsening wound Neurological: Negative for tremors and numbness other than noted  Psychiatric/Behavioral: Negative for decreased concentration or agitation other than above   All other system neg per pt    Objective:   Physical Exam BP 122/80   Pulse 80   Temp 98.8 F (37.1 C) (Oral)   Resp 20   Wt 107 lb (48.5 kg)   SpO2 97%   BMI 16.76 kg/m  VS noted,  Constitutional: Pt appears in no apparent distress HENT: Head: NCAT.  Right Ear: External ear normal.  Left Ear: External ear normal.  Eyes: . Pupils are equal, round, and reactive to light. Conjunctivae and EOM are normal Neck: Normal range of motion. Neck supple.  Cardiovascular: Normal rate and regular rhythm.   Pulmonary/Chest: Effort normal and breath sounds without rales or wheezing.  Neurological: Pt is alert. Not confused , motor grossly intact Skin: Skin is warm. No rash, 1+ ankle bilat LE edema Psychiatric: Pt behavior is normal. No agitation.  No other new exam findings    Assessment & Plan:

## 2016-10-22 NOTE — Progress Notes (Signed)
Pre visit review using our clinic review tool, if applicable. No additional management support is needed unless otherwise documented below in the visit note. 

## 2016-10-22 NOTE — Patient Instructions (Signed)
Please take all new medication as prescribed - the antibiotic  Please continue all other medications as before, and refills have been done if requested - the xanax  Please have the pharmacy call with any other refills you may need.  Please continue your efforts at being more active, low cholesterol diet, and weight control.  Please keep your appointments with your specialists as you may have planned  Please go to the LAB in the Basement (turn left off the elevator) for the tests to be done today as you mentioned  You will be contacted by phone if any changes need to be made immediately.  Otherwise, you will receive a letter about your results with an explanation, but please check with MyChart first.  Please remember to sign up for MyChart if you have not done so, as this will be important to you in the future with finding out test results, communicating by private email, and scheduling acute appointments online when needed.

## 2016-10-23 NOTE — Assessment & Plan Note (Signed)
Ok to cont current tx, for BNP today,  to f/u any worsening symptoms or concerns

## 2016-10-23 NOTE — Assessment & Plan Note (Signed)
stable overall by history and exam, recent data reviewed with pt, and pt to continue medical treatment as before,  to f/u any worsening symptoms or concerns BP Readings from Last 3 Encounters:  10/22/16 122/80  10/06/16 118/70  10/05/16 (!) 136/58

## 2016-10-23 NOTE — Assessment & Plan Note (Signed)
Mild to mod, for antibx course,  to f/u any worsening symptoms or concerns 

## 2016-11-05 ENCOUNTER — Other Ambulatory Visit: Payer: Self-pay | Admitting: Internal Medicine

## 2016-11-06 ENCOUNTER — Other Ambulatory Visit: Payer: Self-pay | Admitting: Internal Medicine

## 2016-11-20 ENCOUNTER — Inpatient Hospital Stay (HOSPITAL_COMMUNITY)
Admission: EM | Admit: 2016-11-20 | Discharge: 2016-11-25 | DRG: 481 | Disposition: A | Discharge status: Skilled Nursing Facility | Payer: Medicare Other | Attending: Internal Medicine | Admitting: Internal Medicine

## 2016-11-20 ENCOUNTER — Encounter (HOSPITAL_COMMUNITY): Payer: Self-pay

## 2016-11-20 ENCOUNTER — Emergency Department (HOSPITAL_COMMUNITY): Payer: Medicare Other

## 2016-11-20 DIAGNOSIS — Z87891 Personal history of nicotine dependence: Secondary | ICD-10-CM | POA: Diagnosis not present

## 2016-11-20 DIAGNOSIS — Z9049 Acquired absence of other specified parts of digestive tract: Secondary | ICD-10-CM

## 2016-11-20 DIAGNOSIS — Y93K1 Activity, walking an animal: Secondary | ICD-10-CM

## 2016-11-20 DIAGNOSIS — Z96641 Presence of right artificial hip joint: Secondary | ICD-10-CM | POA: Diagnosis present

## 2016-11-20 DIAGNOSIS — I481 Persistent atrial fibrillation: Secondary | ICD-10-CM | POA: Diagnosis not present

## 2016-11-20 DIAGNOSIS — G40909 Epilepsy, unspecified, not intractable, without status epilepticus: Secondary | ICD-10-CM | POA: Diagnosis present

## 2016-11-20 DIAGNOSIS — Z882 Allergy status to sulfonamides status: Secondary | ICD-10-CM

## 2016-11-20 DIAGNOSIS — I5032 Chronic diastolic (congestive) heart failure: Secondary | ICD-10-CM | POA: Diagnosis present

## 2016-11-20 DIAGNOSIS — Z419 Encounter for procedure for purposes other than remedying health state, unspecified: Secondary | ICD-10-CM

## 2016-11-20 DIAGNOSIS — I10 Essential (primary) hypertension: Secondary | ICD-10-CM | POA: Diagnosis present

## 2016-11-20 DIAGNOSIS — Z7901 Long term (current) use of anticoagulants: Secondary | ICD-10-CM

## 2016-11-20 DIAGNOSIS — I4819 Other persistent atrial fibrillation: Secondary | ICD-10-CM | POA: Diagnosis present

## 2016-11-20 DIAGNOSIS — R531 Weakness: Secondary | ICD-10-CM | POA: Diagnosis not present

## 2016-11-20 DIAGNOSIS — Z888 Allergy status to other drugs, medicaments and biological substances status: Secondary | ICD-10-CM

## 2016-11-20 DIAGNOSIS — Y92230 Patient room in hospital as the place of occurrence of the external cause: Secondary | ICD-10-CM | POA: Diagnosis not present

## 2016-11-20 DIAGNOSIS — Z91040 Latex allergy status: Secondary | ICD-10-CM | POA: Diagnosis not present

## 2016-11-20 DIAGNOSIS — R569 Unspecified convulsions: Secondary | ICD-10-CM

## 2016-11-20 DIAGNOSIS — Z885 Allergy status to narcotic agent status: Secondary | ICD-10-CM

## 2016-11-20 DIAGNOSIS — S72002S Fracture of unspecified part of neck of left femur, sequela: Secondary | ICD-10-CM | POA: Diagnosis not present

## 2016-11-20 DIAGNOSIS — S72142A Displaced intertrochanteric fracture of left femur, initial encounter for closed fracture: Secondary | ICD-10-CM | POA: Diagnosis not present

## 2016-11-20 DIAGNOSIS — I130 Hypertensive heart and chronic kidney disease with heart failure and stage 1 through stage 4 chronic kidney disease, or unspecified chronic kidney disease: Secondary | ICD-10-CM | POA: Diagnosis not present

## 2016-11-20 DIAGNOSIS — N183 Chronic kidney disease, stage 3 (moderate): Secondary | ICD-10-CM | POA: Diagnosis not present

## 2016-11-20 DIAGNOSIS — S72142D Displaced intertrochanteric fracture of left femur, subsequent encounter for closed fracture with routine healing: Secondary | ICD-10-CM | POA: Diagnosis not present

## 2016-11-20 DIAGNOSIS — Z8673 Personal history of transient ischemic attack (TIA), and cerebral infarction without residual deficits: Secondary | ICD-10-CM | POA: Diagnosis not present

## 2016-11-20 DIAGNOSIS — W010XXA Fall on same level from slipping, tripping and stumbling without subsequent striking against object, initial encounter: Secondary | ICD-10-CM | POA: Diagnosis present

## 2016-11-20 DIAGNOSIS — E785 Hyperlipidemia, unspecified: Secondary | ICD-10-CM | POA: Diagnosis not present

## 2016-11-20 DIAGNOSIS — Z818 Family history of other mental and behavioral disorders: Secondary | ICD-10-CM

## 2016-11-20 DIAGNOSIS — Z79899 Other long term (current) drug therapy: Secondary | ICD-10-CM

## 2016-11-20 DIAGNOSIS — Z8249 Family history of ischemic heart disease and other diseases of the circulatory system: Secondary | ICD-10-CM

## 2016-11-20 DIAGNOSIS — F411 Generalized anxiety disorder: Secondary | ICD-10-CM | POA: Diagnosis present

## 2016-11-20 DIAGNOSIS — F419 Anxiety disorder, unspecified: Secondary | ICD-10-CM | POA: Diagnosis not present

## 2016-11-20 DIAGNOSIS — Y92019 Unspecified place in single-family (private) house as the place of occurrence of the external cause: Secondary | ICD-10-CM

## 2016-11-20 DIAGNOSIS — S299XXA Unspecified injury of thorax, initial encounter: Secondary | ICD-10-CM | POA: Diagnosis not present

## 2016-11-20 DIAGNOSIS — S7012XA Contusion of left thigh, initial encounter: Secondary | ICD-10-CM | POA: Diagnosis present

## 2016-11-20 DIAGNOSIS — I480 Paroxysmal atrial fibrillation: Secondary | ICD-10-CM | POA: Diagnosis not present

## 2016-11-20 DIAGNOSIS — Z8261 Family history of arthritis: Secondary | ICD-10-CM | POA: Diagnosis not present

## 2016-11-20 DIAGNOSIS — T45515A Adverse effect of anticoagulants, initial encounter: Secondary | ICD-10-CM | POA: Diagnosis not present

## 2016-11-20 DIAGNOSIS — Z880 Allergy status to penicillin: Secondary | ICD-10-CM

## 2016-11-20 DIAGNOSIS — Z91048 Other nonmedicinal substance allergy status: Secondary | ICD-10-CM

## 2016-11-20 DIAGNOSIS — I739 Peripheral vascular disease, unspecified: Secondary | ICD-10-CM | POA: Diagnosis not present

## 2016-11-20 DIAGNOSIS — T148XXA Other injury of unspecified body region, initial encounter: Secondary | ICD-10-CM | POA: Diagnosis not present

## 2016-11-20 DIAGNOSIS — I482 Chronic atrial fibrillation: Secondary | ICD-10-CM | POA: Diagnosis present

## 2016-11-20 DIAGNOSIS — S7290XA Unspecified fracture of unspecified femur, initial encounter for closed fracture: Secondary | ICD-10-CM | POA: Diagnosis present

## 2016-11-20 DIAGNOSIS — S72145A Nondisplaced intertrochanteric fracture of left femur, initial encounter for closed fracture: Secondary | ICD-10-CM | POA: Diagnosis not present

## 2016-11-20 DIAGNOSIS — M25552 Pain in left hip: Secondary | ICD-10-CM | POA: Diagnosis not present

## 2016-11-20 DIAGNOSIS — D62 Acute posthemorrhagic anemia: Secondary | ICD-10-CM | POA: Diagnosis not present

## 2016-11-20 DIAGNOSIS — S72009A Fracture of unspecified part of neck of unspecified femur, initial encounter for closed fracture: Secondary | ICD-10-CM | POA: Diagnosis present

## 2016-11-20 DIAGNOSIS — R55 Syncope and collapse: Secondary | ICD-10-CM | POA: Diagnosis not present

## 2016-11-20 LAB — CBC WITH DIFFERENTIAL/PLATELET
BASOS ABS: 0 10*3/uL (ref 0.0–0.1)
BASOS PCT: 0 %
Eosinophils Absolute: 0 10*3/uL (ref 0.0–0.7)
Eosinophils Relative: 0 %
HCT: 38.8 % (ref 36.0–46.0)
HEMOGLOBIN: 12.5 g/dL (ref 12.0–15.0)
Lymphocytes Relative: 12 %
Lymphs Abs: 1.4 10*3/uL (ref 0.7–4.0)
MCH: 28.5 pg (ref 26.0–34.0)
MCHC: 32.2 g/dL (ref 30.0–36.0)
MCV: 88.6 fL (ref 78.0–100.0)
MONOS PCT: 6 %
Monocytes Absolute: 0.6 10*3/uL (ref 0.1–1.0)
NEUTROS ABS: 9.3 10*3/uL — AB (ref 1.7–7.7)
NEUTROS PCT: 82 %
Platelets: 176 10*3/uL (ref 150–400)
RBC: 4.38 MIL/uL (ref 3.87–5.11)
RDW: 15.4 % (ref 11.5–15.5)
WBC: 11.4 10*3/uL — ABNORMAL HIGH (ref 4.0–10.5)

## 2016-11-20 LAB — TYPE AND SCREEN
ABO/RH(D): B POS
ANTIBODY SCREEN: NEGATIVE

## 2016-11-20 LAB — PROTIME-INR
INR: 1.16
Prothrombin Time: 14.8 seconds (ref 11.4–15.2)

## 2016-11-20 LAB — BASIC METABOLIC PANEL
ANION GAP: 9 (ref 5–15)
BUN: 20 mg/dL (ref 6–20)
CALCIUM: 8.7 mg/dL — AB (ref 8.9–10.3)
CO2: 24 mmol/L (ref 22–32)
Chloride: 106 mmol/L (ref 101–111)
Creatinine, Ser: 1.09 mg/dL — ABNORMAL HIGH (ref 0.44–1.00)
GFR, EST AFRICAN AMERICAN: 53 mL/min — AB (ref >60)
GFR, EST NON AFRICAN AMERICAN: 46 mL/min — AB (ref >60)
Glucose, Bld: 221 mg/dL — ABNORMAL HIGH (ref 65–99)
Potassium: 4.8 mmol/L (ref 3.5–5.1)
Sodium: 139 mmol/L (ref 135–145)

## 2016-11-20 MED ORDER — ALPRAZOLAM 0.25 MG PO TABS
0.2500 mg | ORAL_TABLET | Freq: Two times a day (BID) | ORAL | Status: DC | PRN
Start: 2016-11-20 — End: 2016-11-25
  Administered 2016-11-20 – 2016-11-22 (×3): 0.25 mg via ORAL
  Filled 2016-11-20 (×3): qty 1

## 2016-11-20 MED ORDER — LEVETIRACETAM 250 MG PO TABS
250.0000 mg | ORAL_TABLET | Freq: Two times a day (BID) | ORAL | Status: DC
  Administered 2016-11-20 – 2016-11-25 (×10): 250 mg via ORAL
  Filled 2016-11-20 (×10): qty 1

## 2016-11-20 MED ORDER — FENTANYL CITRATE (PF) 100 MCG/2ML IJ SOLN
25.0000 ug | INTRAMUSCULAR | Status: AC | PRN
  Administered 2016-11-20 (×2): 25 ug via INTRAVENOUS
  Filled 2016-11-20 (×2): qty 2

## 2016-11-20 MED ORDER — FENTANYL CITRATE (PF) 100 MCG/2ML IJ SOLN
INTRAMUSCULAR | Status: AC
  Filled 2016-11-20: qty 2

## 2016-11-20 MED ORDER — AMLODIPINE BESYLATE 5 MG PO TABS
2.5000 mg | ORAL_TABLET | Freq: Every day | ORAL | Status: DC
  Administered 2016-11-21 – 2016-11-22 (×2): 2.5 mg via ORAL
  Filled 2016-11-20 (×2): qty 1

## 2016-11-20 MED ORDER — MORPHINE SULFATE (PF) 2 MG/ML IV SOLN
0.5000 mg | INTRAVENOUS | Status: DC | PRN
  Administered 2016-11-21: 0.5 mg via INTRAVENOUS
  Filled 2016-11-20: qty 1

## 2016-11-20 MED ORDER — SODIUM CHLORIDE 0.9 % IV SOLN
INTRAVENOUS | Status: DC
  Administered 2016-11-20: 16:00:00 via INTRAVENOUS

## 2016-11-20 MED ORDER — ATORVASTATIN CALCIUM 10 MG PO TABS
10.0000 mg | ORAL_TABLET | Freq: Every day | ORAL | Status: DC
  Administered 2016-11-21 – 2016-11-24 (×4): 10 mg via ORAL
  Filled 2016-11-20 (×5): qty 1

## 2016-11-20 MED ORDER — TRAMADOL HCL 50 MG PO TABS
50.0000 mg | ORAL_TABLET | Freq: Four times a day (QID) | ORAL | Status: DC | PRN
  Administered 2016-11-20 – 2016-11-25 (×7): 50 mg via ORAL
  Filled 2016-11-20 (×8): qty 1

## 2016-11-20 MED ORDER — SODIUM CHLORIDE 0.9% FLUSH
3.0000 mL | Freq: Two times a day (BID) | INTRAVENOUS | Status: DC
  Administered 2016-11-22 – 2016-11-24 (×4): 3 mL via INTRAVENOUS

## 2016-11-20 MED ORDER — FENTANYL CITRATE (PF) 100 MCG/2ML IJ SOLN
25.0000 ug | Freq: Once | INTRAMUSCULAR | Status: AC
  Administered 2016-11-20: 25 ug via INTRAVENOUS

## 2016-11-20 MED ORDER — ENOXAPARIN SODIUM 40 MG/0.4ML ~~LOC~~ SOLN
40.0000 mg | SUBCUTANEOUS | Status: DC
  Administered 2016-11-22: 40 mg via SUBCUTANEOUS
  Filled 2016-11-20: qty 0.4

## 2016-11-20 MED ORDER — DILTIAZEM HCL ER COATED BEADS 180 MG PO CP24
180.0000 mg | ORAL_CAPSULE | Freq: Every day | ORAL | Status: DC
  Administered 2016-11-21 – 2016-11-23 (×3): 180 mg via ORAL
  Filled 2016-11-20 (×4): qty 1

## 2016-11-20 NOTE — ED Provider Notes (Signed)
[facility] DEPT Provider Note   CSN: [id number] Arrival date & time: 11/20/16  1434  By signing my name below, I, Hansel Feinstein, attest that this documentation has been prepared under the direction and in the presence of Carmin Muskrat, MD. Electronically Signed: Hansel Feinstein, ED Scribe. 11/20/16. 3:02 PM.     History   Chief Complaint Chief Complaint  Patient presents with  . Fall  . Hip Pain    HPI Kayla Kent is a 83 y.o. female who presents to the Emergency Department complaining of moderate left hip pain s/p mechanical fall that occurred one hour ago. Pt states she was walking her dog when she fell onto her left hip. She denies LOC or head injury. Pt states she has not been ambulatory since falling d/t pain. She also reports more mild right hip pain since falling. Pt states prior to falling, she was feeling well and having a normal day. She reports h/o right hip replacement. She denies numbness, left knee pain, left ankle pain, additional injuries.   The history is provided by the patient. No language interpreter was used.    Past Medical History:  Diagnosis Date  . Allergic rhinitis   . Anxiety   . HLD (hyperlipidemia)   . HTN (hypertension)   . Intolerance of drug    orthostatic  . Paroxysmal supraventricular tachycardia ([place])   . PVD (peripheral vascular disease) ([place])   . Syncope and collapse   . Uterine prolapse without mention of vaginal wall prolapse     Patient Active Problem List   Diagnosis Date Noted  . Pedal edema 10/10/2016  . Dysuria 10/06/2016  . Acute sinus infection 03/04/2015  . Persistent atrial fibrillation ([place]) 12/11/2014  . S/P ablation of atrial fibrillation 12/11/2014  . Orthostatic hypotension 12/11/2014  . Paroxysmal SVT (supraventricular tachycardia) ([place]) 12/11/2014  . Atherosclerotic peripheral vascular disease ([place]) 12/11/2014  . PAF (paroxysmal atrial fibrillation) ([place]) 11/05/2014  . Orthostasis 10/03/2014  . Non-compliant  behavior 10/01/2014  . TIA (transient ischemic attack) 09/23/2014  . Numbness of left hand   . HCAP (healthcare-associated pneumonia) 09/21/2014  . Elevated brain natriuretic peptide (BNP) level 09/21/2014  . Acute on chronic diastolic heart failure ([place]) 09/21/2014  . Acute respiratory failure ([place]) 09/21/2014  . Fracture of femoral neck, right ([place]) 08/28/2014  . Distal radius fracture, right 08/28/2014  . Hip fracture requiring operative repair ([place]) 08/28/2014  . Paroxysmal spells 03/28/2014  . Seizures ([place]) 03/28/2013  . Syncope and collapse 07/27/2012  . Routine health maintenance 12/12/2011  . PERIPHERAL CIRCULATORY DISORDER 04/10/2010  . Chronic diastolic CHF (congestive heart failure) ([place]) 07/17/2009  . Depression 07/04/2009  . Anxiety 01/19/2008  . HLD (hyperlipidemia) 11/14/2007  . Essential hypertension 11/14/2007  . PAROXYSMAL ATRIAL TACHYCARDIA 11/14/2007  . UTERINE PROLAPSE 11/14/2007    Past Surgical History:  Procedure Laterality Date  . CARDIOVERSION N/A 11/05/2014   Procedure: CARDIOVERSION;  Surgeon: Candee Furbish, MD;  Location: [facility] ENDOSCOPY;  Service: Cardiovascular;  Laterality: N/A;  . cataract surgery  08/2015  . CHOLECYSTECTOMY    . corrective eye surgery     as a child  . IVD removed    . OPEN REDUCTION INTERNAL FIXATION (ORIF) DISTAL RADIAL FRACTURE Right 08/29/2014   Procedure: OPEN REDUCTION INTERNAL FIXATION (ORIF) DISTAL RADIAL FRACTURE;  Surgeon: Marianna Payment, MD;  Location: [place];  Service: Orthopedics;  Laterality: Right;  . RF ablation PSVT     summer '10  . stress cardiolite  08/05/93  .  TONSILLECTOMY    . TOTAL HIP ARTHROPLASTY Right 08/29/2014   Procedure: Right Hip Hemi Arthroplasty;  Surgeon: Marianna Payment, MD;  Location: [place];  Service: Orthopedics;  Laterality: Right;  Hip procedure 1st wants Peg Board, [employer], [place].     OB History    No data available       Home Medications    Prior to Admission  medications   Medication Sig Start Date End Date Taking? Authorizing Provider  ALPRAZolam (XANAX) 0.25 MG tablet Take 1 tablet (0.25 mg total) by mouth 2 (two) times daily as needed for anxiety. 10/22/16   Biagio Borg, MD  amLODipine (NORVASC) 2.5 MG tablet Take 1 tablet (2.5 mg total) by mouth daily. 08/06/16 08/01/17  Fay Records, MD  atorvastatin (LIPITOR) 10 MG tablet TAKE ONE TABLET BY MOUTH ONCE DAILY AT 6 PM 09/06/16   Fay Records, MD  diltiazem (DILACOR XR) 180 MG 24 hr capsule TAKE ONE CAPSULE BY MOUTH ONCE DAILY 11/05/16   Biagio Borg, MD  doxycycline (VIBRA-TABS) 100 MG tablet Take 1 tablet (100 mg total) by mouth 2 (two) times daily. 10/22/16   Biagio Borg, MD  ELIQUIS 2.5 MG TABS tablet TAKE ONE TABLET BY MOUTH TWICE DAILY 10/08/16   Fay Records, MD  levETIRAcetam (KEPPRA) 250 MG tablet TAKE ONE TABLET BY MOUTH TWICE DAILY 11/08/16   Biagio Borg, MD  zolpidem (AMBIEN) 5 MG tablet Take 1 tablet (5 mg total) by mouth at bedtime as needed. for insomnia 10/06/16   Biagio Borg, MD    Family History Family History  Problem Relation Age of Onset  . Mental illness Father     suicide  . Arthritis Father   . Hyperlipidemia Sister   . Hypertension Sister   . Heart disease Brother     CAD/MI  . Hypertension Brother   . Coronary artery disease Brother   . Hypertension      family hx  . Heart attack Brother   . Colon cancer Neg Hx   . Breast cancer Neg Hx   . Diabetes Neg Hx   . Stroke Neg Hx     Social History Social History  Substance Use Topics  . Smoking status: Former Smoker    Quit date: 12/08/1955  . Smokeless tobacco: Never Used     Comment: quit in 1995   . Alcohol use No     Allergies   Chocolate; Codeine; Diazepam; Fruit & vegetable daily [nutritional supplements]; Iohexol; Latex; Peach [prunus persica]; Peanut-containing drug products; Penicillins; Strawberry extract; Sulfonamide derivatives; Wheat; Wheat bran; Sulfamethoxazole; Crestor [rosuvastatin calcium];  and Iodine   Review of Systems Review of Systems  Constitutional:       Per HPI, otherwise negative  HENT:       Per HPI, otherwise negative  Respiratory:       Per HPI, otherwise negative  Cardiovascular:       Per HPI, otherwise negative  Gastrointestinal: Negative for vomiting.  Endocrine:       Negative aside from HPI  Genitourinary:       Neg aside from HPI   Musculoskeletal:       Per HPI, otherwise negative  Skin: Negative.   Allergic/Immunologic: Negative for immunocompromised state.  Neurological: Negative for syncope.     Physical Exam Updated Vital Signs BP 166/59 (BP Location: Left Arm)   Pulse 76   Temp 97.8 F (36.6 C) (Oral)   Resp  16   SpO2 99%   Physical Exam  Constitutional: She is oriented to person, place, and time. She has a sickly appearance. No distress.  HENT:  Head: Normocephalic and atraumatic.  Eyes: Conjunctivae and EOM are normal.  Cardiovascular: Normal rate and regular rhythm.   Pulmonary/Chest: Effort normal and breath sounds normal. No stridor. No respiratory distress.  Abdominal: She exhibits no distension.  Musculoskeletal: She exhibits no edema.       Right knee: Normal.       Right ankle: Normal.       Legs: Left leg shortened and externally rotated.   Neurological: She is alert and oriented to person, place, and time. No cranial nerve deficit.  Patient moves all toes spontaneously, has preserved sensation in her left lower extremity distally  Skin: Skin is warm and dry.  Psychiatric: She has a normal mood and affect.  Nursing note and vitals reviewed.    ED Treatments / Results   DIAGNOSTIC STUDIES: Oxygen Saturation is 99% on RA, normal by my interpretation.    COORDINATION OF CARE: 2:58 PM Discussed treatment plan with pt at bedside which includes XR, pain management and pt agreed to plan.    Labs (all labs ordered are listed, but only abnormal results are displayed) Labs Reviewed  BASIC METABOLIC PANEL -  Abnormal; Notable for the following:       Result Value   Glucose, Bld 221 (*)    Creatinine, Ser 1.09 (*)    Calcium 8.7 (*)    GFR calc non Af Amer 46 (*)    GFR calc Af Amer 53 (*)    All other components within normal limits  CBC WITH DIFFERENTIAL/PLATELET - Abnormal; Notable for the following:    WBC 11.4 (*)    Neutro Abs 9.3 (*)    All other components within normal limits  PROTIME-INR  TYPE AND SCREEN   Radiology Dg Chest 1 View  Result Date: 11/20/2016 CLINICAL DATA:  Fall EXAM: CHEST 1 VIEW COMPARISON:  09/23/2014 chest radiograph. FINDINGS: Stable cardiomediastinal silhouette with mild cardiomegaly and aortic atherosclerosis. No pneumothorax. No pleural effusion. No overt pulmonary edema. Mild scarring at the left lung base. No acute consolidative airspace disease. No displaced fractures in the chest. IMPRESSION: Stable mild cardiomegaly. No overt pulmonary edema. No acute pulmonary disease. Mild scarring at the left lung base. Aortic atherosclerosis. Electronically Signed   By: Ilona Sorrel M.D.   On: 11/20/2016 16:49   Dg Hip Unilat With Pelvis 2-3 Views Left  Result Date: 11/20/2016 CLINICAL DATA:  Left hip pain after fall today EXAM: DG HIP (WITH OR WITHOUT PELVIS) 2-3V LEFT COMPARISON:  None. FINDINGS: Comminuted acute intertrochanteric left proximal femur fracture with mild impaction, noting a butterfly fragment involving the lesser trochanter. No dislocation at the left hip joint. Right hip hemiarthroplasty with no evidence of right hip dislocation on the single frontal pelvic view. No pelvic diastasis. No suspicious focal osseous lesion. IMPRESSION: Acute comminuted intertrochanteric left proximal femur fracture. Electronically Signed   By: Ilona Sorrel M.D.   On: 11/20/2016 16:48    Procedures Procedures (including critical care time)  Medications Ordered in ED Medications  0.9 %  sodium chloride infusion ( Intravenous New Bag/Given 11/20/16 1531)  fentaNYL (SUBLIMAZE)  injection 25 mcg (25 mcg Intravenous Given 11/20/16 1531)   After the initial evaluation I reviewed the patient's imaging, discussed with our orthopedist. Patient required admission for orthopedic repair. On repeat exam the patient continued to have pain, but  is in no distress, no new complaints. She continues to deny any head trauma, head pain, neck pain. Subsequent discussed patient's case with our hospitalist colleagues for admission.  This elderly female presenting after mechanical fall was found to have left hip fracture. Patient is no distal neurovascular compromise, is awake and alert, with no head trauma, no evidence for cervical spine, head injuries. After discussion with multiple associates, the patient was admitted for further evaluation and management.   Initial Impression / Assessment and Plan / ED Course  I have reviewed the triage vital signs and the nursing notes.  Pertinent labs & imaging results that were available during my care of the patient were reviewed by me and considered in my medical decision making (see chart for details).   I personally performed the services described in this documentation, which was scribed in my presence. The recorded information has been reviewed and is accurate.       Carmin Muskrat, MD 11/20/16 [phone]

## 2016-11-20 NOTE — ED Notes (Signed)
Bed: WK08 Expected date: 11/20/16 Expected time: 2:35 PM Means of arrival: Ambulance Comments: Fall, hip injury

## 2016-11-20 NOTE — ED Notes (Signed)
She states Dr. Elba Barman has performed right hip surgery; and also mentions she is on Eliquiis for A-fib.

## 2016-11-20 NOTE — ED Notes (Signed)
I attempted to phone report to [facility] at [facility] are unable to take report now and will call back.

## 2016-11-20 NOTE — ED Notes (Signed)
[facility] notified for need of transport to [facility]

## 2016-11-20 NOTE — H&P (Signed)
History and Physical    Kayla Kent VZD:[id number] DOB: 1931-11-21 DOA: 11/20/2016  PCP: Cathlean Cower, MD  Patient coming from: home  Chief Complaint: fall  HPI: Kayla Kent is a 83 y.o. female with medical history significant of A Fib on Eliquis, HLD, HTN, seizure disorder, who presents today after a fall. She was letting her dog out and coming back into her home when she tripped over her dog and fell onto concrete floor. She did not hit her head or loss consciousness. Prior to this, she had been feeling well other than recovering from sinus infection. She currently denies any chest pain, shortness of breath, nausea, vomiting, abdominal pain, rash, skin wounds. She does have right ankle swelling that is chronic in nature. She has no other complaints currently other than discomfort in her hip.   ED Course: Xray left hip obtained which revealed acute left proximal femur fracture. Orthopedic surgery was consulted who asked for [facility] admit and to transfer to [facility] for surgical intervention.   Review of Systems: As per HPI otherwise 10 point review of systems negative.   Past Medical History:  Diagnosis Date  . Allergic rhinitis   . Anxiety   . HLD (hyperlipidemia)   . HTN (hypertension)   . Intolerance of drug    orthostatic  . Paroxysmal supraventricular tachycardia ([place])   . PVD (peripheral vascular disease) ([place])   . Syncope and collapse   . Uterine prolapse without mention of vaginal wall prolapse     Past Surgical History:  Procedure Laterality Date  . CARDIOVERSION N/A 11/05/2014   Procedure: CARDIOVERSION;  Surgeon: Candee Furbish, MD;  Location: [facility] ENDOSCOPY;  Service: Cardiovascular;  Laterality: N/A;  . cataract surgery  08/2015  . CHOLECYSTECTOMY    . corrective eye surgery     as a child  . IVD removed    . OPEN REDUCTION INTERNAL FIXATION (ORIF) DISTAL RADIAL FRACTURE Right 08/29/2014   Procedure: OPEN REDUCTION INTERNAL FIXATION (ORIF) DISTAL RADIAL FRACTURE;   Surgeon: Marianna Payment, MD;  Location: [place];  Service: Orthopedics;  Laterality: Right;  . RF ablation PSVT     summer '10  . stress cardiolite  08/05/93  . TONSILLECTOMY    . TOTAL HIP ARTHROPLASTY Right 08/29/2014   Procedure: Right Hip Hemi Arthroplasty;  Surgeon: Marianna Payment, MD;  Location: [place];  Service: Orthopedics;  Laterality: Right;  Hip procedure 1st wants [facility] [employer], [employer], [place].      reports that she quit smoking about 60 years ago. She has never used smokeless tobacco. She reports that she does not drink alcohol or use drugs.  Allergies  Allergen Reactions  . Chocolate Hives  . Codeine Other (See Comments)    Patient states she acts crazy  . Diazepam Other (See Comments)    Patient states she acts crazy.  . Fruit & Vegetable Daily [Nutritional Supplements] Hives and Swelling    peaches  . Iohexol Hives     Code: HIVES, Desc: pt gets 13 hr pre-meds, Onset Date: [id number]   . Latex Hives  . Peach [Prunus Persica] Hives  . Peanut-Containing Drug Products Hives and Swelling  . Penicillins Hives, Itching and Swelling  . Strawberry Extract Hives  . Sulfonamide Derivatives Hives    nausea  . Wheat Shortness Of Breath    Shortness of breath  . Wheat Bran Shortness Of Breath  . Sulfamethoxazole Hives  . Crestor [Rosuvastatin Calcium] Rash  . Iodine Rash  Family History  Problem Relation Age of Onset  . Mental illness Father     suicide  . Arthritis Father   . Hyperlipidemia Sister   . Hypertension Sister   . Heart disease Brother     CAD/MI  . Hypertension Brother   . Coronary artery disease Brother   . Hypertension      family hx  . Heart attack Brother   . Colon cancer Neg Hx   . Breast cancer Neg Hx   . Diabetes Neg Hx   . Stroke Neg Hx     Prior to Admission medications   Medication Sig Start Date End Date Taking? Authorizing Provider  ALPRAZolam (XANAX) 0.25 MG tablet Take 1 tablet (0.25 mg total) by mouth 2 (two) times  daily as needed for anxiety. 10/22/16   Biagio Borg, MD  amLODipine (NORVASC) 2.5 MG tablet Take 1 tablet (2.5 mg total) by mouth daily. 08/06/16 08/01/17  Fay Records, MD  atorvastatin (LIPITOR) 10 MG tablet TAKE ONE TABLET BY MOUTH ONCE DAILY AT 6 PM 09/06/16   Fay Records, MD  diltiazem (DILACOR XR) 180 MG 24 hr capsule TAKE ONE CAPSULE BY MOUTH ONCE DAILY 11/05/16   Biagio Borg, MD  doxycycline (VIBRA-TABS) 100 MG tablet Take 1 tablet (100 mg total) by mouth 2 (two) times daily. 10/22/16   Biagio Borg, MD  ELIQUIS 2.5 MG TABS tablet TAKE ONE TABLET BY MOUTH TWICE DAILY 10/08/16   Fay Records, MD  levETIRAcetam (KEPPRA) 250 MG tablet TAKE ONE TABLET BY MOUTH TWICE DAILY 11/08/16   Biagio Borg, MD  zolpidem (AMBIEN) 5 MG tablet Take 1 tablet (5 mg total) by mouth at bedtime as needed. for insomnia 10/06/16   Biagio Borg, MD    Physical Exam: Vitals:   11/20/16 1451  BP: 166/59  Pulse: 76  Resp: 16  Temp: 97.8 F (36.6 C)  TempSrc: Oral  SpO2: 99%    Constitutional: NAD, calm, comfortable Eyes: PERRL, lids and conjunctivae normal ENMT: Mucous membranes are moist. Posterior pharynx clear of any exudate or lesions.Normal dentition.  Neck: normal, supple, no masses, no thyromegaly Respiratory: clear to auscultation bilaterally, no wheezing, no crackles. Normal respiratory effort. No accessory muscle use.  Cardiovascular: Irregular rhythm, rate 80s, no murmurs / rubs / gallops. No extremity edema. 2+ pedal pulses. Abdomen: no tenderness, no masses palpated. No hepatosplenomegaly. Bowel sounds positive.  Musculoskeletal: no clubbing / cyanosis. No joint deformity upper and lower extremities. Normal muscle tone. Trace edema right ankle   Skin: no rashes, lesions, ulcers. No induration Neurologic: CN 2-12 grossly intact. Sensation intact. Nonfocal .  Psychiatric: Normal judgment and insight. Alert and oriented x 3. Normal mood.   Labs on Admission: I have personally reviewed following  labs and imaging studies  CBC:  Recent Labs Lab 11/20/16 1526  WBC 11.4*  NEUTROABS 9.3*  HGB 12.5  HCT 38.8  MCV 88.6  PLT 161   Basic Metabolic Panel:  Recent Labs Lab 11/20/16 1526  NA 139  K 4.8  CL 106  CO2 24  GLUCOSE 221*  BUN 20  CREATININE 1.09*  CALCIUM 8.7*   GFR: CrCl cannot be calculated (Unknown ideal weight.). Liver Function Tests: No results for input(s): AST, ALT, ALKPHOS, BILITOT, PROT, ALBUMIN in the last 168 hours. No results for input(s): LIPASE, AMYLASE in the last 168 hours. No results for input(s): AMMONIA in the last 168 hours. Coagulation Profile:  Recent Labs Lab 11/20/16 1526  INR 1.16   Cardiac Enzymes: No results for input(s): CKTOTAL, CKMB, CKMBINDEX, TROPONINI in the last 168 hours. BNP (last 3 results)  Recent Labs  10/22/16 1135  PROBNP 410.0*   HbA1C: No results for input(s): HGBA1C in the last 72 hours. CBG: No results for input(s): GLUCAP in the last 168 hours. Lipid Profile: No results for input(s): CHOL, HDL, LDLCALC, TRIG, CHOLHDL, LDLDIRECT in the last 72 hours. Thyroid Function Tests: No results for input(s): TSH, T4TOTAL, FREET4, T3FREE, THYROIDAB in the last 72 hours. Anemia Panel: No results for input(s): VITAMINB12, FOLATE, FERRITIN, TIBC, IRON, RETICCTPCT in the last 72 hours. Urine analysis:    Component Value Date/Time   COLORURINE YELLOW 10/06/2016 1235   APPEARANCEUR Cloudy (A) 10/06/2016 1235   LABSPEC 1.025 10/06/2016 1235   PHURINE 6.0 10/06/2016 1235   GLUCOSEU NEGATIVE 10/06/2016 1235   HGBUR NEGATIVE 10/06/2016 1235   BILIRUBINUR NEGATIVE 10/06/2016 1235   KETONESUR NEGATIVE 10/06/2016 1235   PROTEINUR 30 (A) 09/21/2014 1532   UROBILINOGEN 0.2 10/06/2016 1235   NITRITE NEGATIVE 10/06/2016 1235   LEUKOCYTESUR NEGATIVE 10/06/2016 1235   Sepsis Labs: !!!!!!!!!!!!!!!!!!!!!!!!!!!!!!!!!!!!!!!!!!!! '@LABRCNTIP'$ (procalcitonin:4,lacticidven:4) )No results found for this or any previous visit  (from the past 240 hour(s)).   Radiological Exams on Admission: Dg Chest 1 View  Result Date: 11/20/2016 CLINICAL DATA:  Fall EXAM: CHEST 1 VIEW COMPARISON:  09/23/2014 chest radiograph. FINDINGS: Stable cardiomediastinal silhouette with mild cardiomegaly and aortic atherosclerosis. No pneumothorax. No pleural effusion. No overt pulmonary edema. Mild scarring at the left lung base. No acute consolidative airspace disease. No displaced fractures in the chest. IMPRESSION: Stable mild cardiomegaly. No overt pulmonary edema. No acute pulmonary disease. Mild scarring at the left lung base. Aortic atherosclerosis. Electronically Signed   By: Ilona Sorrel M.D.   On: 11/20/2016 16:49   Dg Hip Unilat With Pelvis 2-3 Views Left  Result Date: 11/20/2016 CLINICAL DATA:  Left hip pain after fall today EXAM: DG HIP (WITH OR WITHOUT PELVIS) 2-3V LEFT COMPARISON:  None. FINDINGS: Comminuted acute intertrochanteric left proximal femur fracture with mild impaction, noting a butterfly fragment involving the lesser trochanter. No dislocation at the left hip joint. Right hip hemiarthroplasty with no evidence of right hip dislocation on the single frontal pelvic view. No pelvic diastasis. No suspicious focal osseous lesion. IMPRESSION: Acute comminuted intertrochanteric left proximal femur fracture. Electronically Signed   By: Ilona Sorrel M.D.   On: 11/20/2016 16:48    EKG: pending at time of admission  Assessment/Plan Principal Problem:   Closed femur fracture ([place]) Active Problems:   HLD (hyperlipidemia)   Essential hypertension   Seizures (HCC)   Persistent atrial fibrillation (HCC)  Acute left proximal femur fracture after fall -Orthopedic surgery consulted, plan for OR  -Pain control -NPO  -Obtain EKG  -Lyndel Safe estimated risk probability for perioperative myocardial infarction or cardiac arrest is 0.22%   Chronic A Fib -Rate well controlled -Continue cardizem  -Hold eliquis   HLD -Continue lipitor    HTN -Continue cardizem, norvasc   Seizure disorder -Continue keppra    DVT prophylaxis: lovenox (has been on Eliquis for A fib prior to admission) Code Status: Full  Family Communication: daughter at bedside Disposition Plan: pending post-operative progression, home vs rehab Consults called: orthopedic surgery called by EDP   Admission status: inpatient telemetry to [place]y [place], DO [facility] [email or web address] Password [email or web address] 11/20/2016, 5:11 PM

## 2016-11-20 NOTE — ED Triage Notes (Signed)
She states she fell as she was walking her [place] dog; thereby landing on her left side. She c/o much left hip area pain which was somewhat relieved when the paramedic tied a blanket and applied pelvic support/pressure. She is alert and oriented and in no distress.

## 2016-11-21 ENCOUNTER — Inpatient Hospital Stay (HOSPITAL_COMMUNITY): Payer: Medicare Other | Admitting: Certified Registered"

## 2016-11-21 ENCOUNTER — Encounter (HOSPITAL_COMMUNITY)
Admission: EM | Disposition: A | Discharge status: Skilled Nursing Facility | Payer: Self-pay | Source: Home / Self Care | Attending: Internal Medicine

## 2016-11-21 ENCOUNTER — Encounter (HOSPITAL_COMMUNITY): Payer: Self-pay | Admitting: Certified Registered"

## 2016-11-21 ENCOUNTER — Inpatient Hospital Stay (HOSPITAL_COMMUNITY): Payer: Medicare Other

## 2016-11-21 DIAGNOSIS — S72142A Displaced intertrochanteric fracture of left femur, initial encounter for closed fracture: Principal | ICD-10-CM

## 2016-11-21 HISTORY — PX: INTRAMEDULLARY (IM) NAIL INTERTROCHANTERIC: SHX5875

## 2016-11-21 LAB — BASIC METABOLIC PANEL
Anion gap: 9 (ref 5–15)
BUN: 12 mg/dL (ref 6–20)
CHLORIDE: 105 mmol/L (ref 101–111)
CO2: 24 mmol/L (ref 22–32)
CREATININE: 0.81 mg/dL (ref 0.44–1.00)
Calcium: 8.5 mg/dL — ABNORMAL LOW (ref 8.9–10.3)
GFR calc Af Amer: >60 mL/min (ref >60)
GFR calc non Af Amer: >60 mL/min (ref >60)
GLUCOSE: 144 mg/dL — AB (ref 65–99)
Potassium: 4.3 mmol/L (ref 3.5–5.1)
Sodium: 138 mmol/L (ref 135–145)

## 2016-11-21 LAB — CBC
HCT: 37.1 % (ref 36.0–46.0)
HEMOGLOBIN: 11.8 g/dL — AB (ref 12.0–15.0)
MCH: 28.4 pg (ref 26.0–34.0)
MCHC: 31.8 g/dL (ref 30.0–36.0)
MCV: 89.4 fL (ref 78.0–100.0)
PLATELETS: 161 10*3/uL (ref 150–400)
RBC: 4.15 MIL/uL (ref 3.87–5.11)
RDW: 15.4 % (ref 11.5–15.5)
WBC: 9.4 10*3/uL (ref 4.0–10.5)

## 2016-11-21 LAB — SURGICAL PCR SCREEN
MRSA, PCR: NEGATIVE
STAPHYLOCOCCUS AUREUS: NEGATIVE

## 2016-11-21 SURGERY — FIXATION, FRACTURE, INTERTROCHANTERIC, WITH INTRAMEDULLARY ROD
Anesthesia: General | Site: Hip | Laterality: Left

## 2016-11-21 MED ORDER — HYDROCODONE-ACETAMINOPHEN 5-325 MG PO TABS: 1.0000 | ORAL_TABLET | Freq: Four times a day (QID) | ORAL | Status: DC | PRN

## 2016-11-21 MED ORDER — 0.9 % SODIUM CHLORIDE (POUR BTL) OPTIME
TOPICAL | Status: DC | PRN
  Administered 2016-11-21: 1000 mL

## 2016-11-21 MED ORDER — MORPHINE SULFATE (PF) 2 MG/ML IV SOLN
0.5000 mg | INTRAVENOUS | Status: DC | PRN
  Administered 2016-11-21: 0.5 mg via INTRAVENOUS
  Filled 2016-11-21: qty 1

## 2016-11-21 MED ORDER — CHLORHEXIDINE GLUCONATE 4 % EX LIQD: 60.0000 mL | Freq: Once | CUTANEOUS | Status: DC

## 2016-11-21 MED ORDER — ONDANSETRON HCL 4 MG PO TABS
4.0000 mg | ORAL_TABLET | Freq: Four times a day (QID) | ORAL | Status: DC | PRN
Start: 2016-11-21 — End: 2016-11-25

## 2016-11-21 MED ORDER — FENTANYL CITRATE (PF) 100 MCG/2ML IJ SOLN
INTRAMUSCULAR | Status: AC
  Filled 2016-11-21: qty 2

## 2016-11-21 MED ORDER — PROPOFOL 10 MG/ML IV BOLUS
INTRAVENOUS | Status: DC | PRN
  Administered 2016-11-21: 110 mg via INTRAVENOUS

## 2016-11-21 MED ORDER — ONDANSETRON HCL 4 MG/2ML IJ SOLN: 4.0000 mg | Freq: Four times a day (QID) | INTRAMUSCULAR | Status: DC | PRN

## 2016-11-21 MED ORDER — MENTHOL 3 MG MT LOZG
1.0000 | LOZENGE | OROMUCOSAL | Status: DC | PRN
  Filled 2016-11-21: qty 9

## 2016-11-21 MED ORDER — POVIDONE-IODINE 10 % EX SWAB: 2.0000 "application " | Freq: Once | CUTANEOUS | Status: DC

## 2016-11-21 MED ORDER — SUGAMMADEX SODIUM 200 MG/2ML IV SOLN
INTRAVENOUS | Status: DC | PRN
  Administered 2016-11-21: 150 mg via INTRAVENOUS

## 2016-11-21 MED ORDER — FENTANYL CITRATE (PF) 100 MCG/2ML IJ SOLN
INTRAMUSCULAR | Status: DC | PRN
Start: 2016-11-21 — End: 2016-11-21
  Administered 2016-11-21: 100 ug via INTRAVENOUS

## 2016-11-21 MED ORDER — LACTATED RINGERS IV SOLN
INTRAVENOUS | Status: DC | PRN
  Administered 2016-11-21: 09:00:00 via INTRAVENOUS

## 2016-11-21 MED ORDER — PHENYLEPHRINE 40 MCG/ML (10ML) SYRINGE FOR IV PUSH (FOR BLOOD PRESSURE SUPPORT)
PREFILLED_SYRINGE | INTRAVENOUS | Status: DC | PRN
  Administered 2016-11-21: 120 ug via INTRAVENOUS
  Administered 2016-11-21: 80 ug via INTRAVENOUS

## 2016-11-21 MED ORDER — CLINDAMYCIN PHOSPHATE 600 MG/50ML IV SOLN
600.0000 mg | Freq: Four times a day (QID) | INTRAVENOUS | Status: AC
  Administered 2016-11-21 (×2): 600 mg via INTRAVENOUS
  Filled 2016-11-21 (×3): qty 50

## 2016-11-21 MED ORDER — METOCLOPRAMIDE HCL 5 MG/ML IJ SOLN: 5.0000 mg | Freq: Three times a day (TID) | INTRAMUSCULAR | Status: DC | PRN

## 2016-11-21 MED ORDER — CLINDAMYCIN PHOSPHATE 900 MG/50ML IV SOLN
900.0000 mg | INTRAVENOUS | Status: AC
  Administered 2016-11-21: 900 mg via INTRAVENOUS

## 2016-11-21 MED ORDER — PHENOL 1.4 % MT LIQD: 1.0000 | OROMUCOSAL | Status: DC | PRN

## 2016-11-21 MED ORDER — CLINDAMYCIN PHOSPHATE 900 MG/50ML IV SOLN
INTRAVENOUS | Status: AC
  Filled 2016-11-21: qty 50

## 2016-11-21 MED ORDER — ONDANSETRON HCL 4 MG/2ML IJ SOLN
INTRAMUSCULAR | Status: AC
  Filled 2016-11-21: qty 2

## 2016-11-21 MED ORDER — LIDOCAINE 2% (20 MG/ML) 5 ML SYRINGE
INTRAMUSCULAR | Status: DC | PRN
  Administered 2016-11-21: 100 mg via INTRAVENOUS

## 2016-11-21 MED ORDER — ACETAMINOPHEN 650 MG RE SUPP: 650.0000 mg | Freq: Four times a day (QID) | RECTAL | Status: DC | PRN

## 2016-11-21 MED ORDER — LACTATED RINGERS IV SOLN
INTRAVENOUS | Status: DC
  Administered 2016-11-21: 18:00:00 via INTRAVENOUS

## 2016-11-21 MED ORDER — METOCLOPRAMIDE HCL 5 MG PO TABS: 5.0000 mg | ORAL_TABLET | Freq: Three times a day (TID) | ORAL | Status: DC | PRN

## 2016-11-21 MED ORDER — ROCURONIUM BROMIDE 10 MG/ML (PF) SYRINGE
PREFILLED_SYRINGE | INTRAVENOUS | Status: DC | PRN
  Administered 2016-11-21: 40 mg via INTRAVENOUS

## 2016-11-21 MED ORDER — SUGAMMADEX SODIUM 200 MG/2ML IV SOLN
INTRAVENOUS | Status: AC
  Filled 2016-11-21: qty 2

## 2016-11-21 MED ORDER — ACETAMINOPHEN 325 MG PO TABS: 650.0000 mg | ORAL_TABLET | Freq: Four times a day (QID) | ORAL | Status: DC | PRN

## 2016-11-21 SURGICAL SUPPLY — 35 items
BIT DRILL FLUTED FEMUR 4.2/3 (BIT) ×3 IMPLANT
BLADE HELICAL TFNA 90 HIP (Anchor) ×2 IMPLANT
BLADE HELICAL TFNA 90MM HIP (Anchor) ×1 IMPLANT
BLADE SURG 15 STRL LF DISP TIS (BLADE) ×1 IMPLANT
BLADE SURG 15 STRL SS (BLADE) ×2
COVER PERINEAL POST (MISCELLANEOUS) ×3 IMPLANT
COVER SURGICAL LIGHT HANDLE (MISCELLANEOUS) ×6 IMPLANT
COVER TABLE BACK 60X90 (DRAPES) ×3 IMPLANT
DRAPE C-ARM 42X72 X-RAY (DRAPES) ×3 IMPLANT
DRAPE STERI IOBAN 125X83 (DRAPES) IMPLANT
DRAPE SURG ISO 125X83 STRL (DRAPES) ×3 IMPLANT
DRSG ADAPTIC 3X8 NADH LF (GAUZE/BANDAGES/DRESSINGS) ×3 IMPLANT
DRSG MEPILEX BORDER 4X4 (GAUZE/BANDAGES/DRESSINGS) ×3 IMPLANT
DRSG MEPILEX BORDER 4X8 (GAUZE/BANDAGES/DRESSINGS) ×3 IMPLANT
ELECT REM PT RETURN 9FT ADLT (ELECTROSURGICAL) ×3
ELECTRODE REM PT RTRN 9FT ADLT (ELECTROSURGICAL) ×1 IMPLANT
EVACUATOR 1/8 PVC DRAIN (DRAIN) IMPLANT
GLOVE BIOGEL PI IND STRL 9 (GLOVE) ×1 IMPLANT
GLOVE BIOGEL PI INDICATOR 9 (GLOVE) ×2
GLOVE SURG ORTHO 9.0 STRL STRW (GLOVE) ×3 IMPLANT
GOWN STRL REUS W/ TWL XL LVL3 (GOWN DISPOSABLE) ×3 IMPLANT
GOWN STRL REUS W/TWL XL LVL3 (GOWN DISPOSABLE) ×6
GUIDEWIRE 3.2X400 (WIRE) ×3 IMPLANT
KIT BASIN OR (CUSTOM PROCEDURE TRAY) ×3 IMPLANT
KIT ROOM TURNOVER OR (KITS) ×3 IMPLANT
LINER BOOT UNIVERSAL DISP (MISCELLANEOUS) ×3 IMPLANT
MANIFOLD NEPTUNE II (INSTRUMENTS) ×3 IMPLANT
NAIL TROCH FIX 10X170 130 (Nail) ×3 IMPLANT
NS IRRIG 1000ML POUR BTL (IV SOLUTION) ×3 IMPLANT
PACK GENERAL/GYN (CUSTOM PROCEDURE TRAY) ×3 IMPLANT
PAD ARMBOARD 7.5X6 YLW CONV (MISCELLANEOUS) ×6 IMPLANT
SCREW LOCKING 5.0X34MM (Screw) ×3 IMPLANT
STAPLER VISISTAT 35W (STAPLE) IMPLANT
SUT VIC AB 2-0 CTB1 (SUTURE) IMPLANT
WATER STERILE IRR 1000ML POUR (IV SOLUTION) ×6 IMPLANT

## 2016-11-21 NOTE — Op Note (Signed)
DATE OF SURGERY:  11/21/2016  TIME: 10:32 AM  PATIENT NAME:  Kayla Kent     AGE: 83 y.o.  PRE-OPERATIVE DIAGNOSIS:  left intertroch fx  POST-OPERATIVE DIAGNOSIS:  SAME  PROCEDURE:  INTRAMEDULLARY (IM) NAIL INTERTROCHANTRIC HEMI  SURGEON:  Newt Minion  ASSISTANT:  none.  OPERATIVE IMPLANTS: Synthes trochanteric femoral nail with interlocking helical blade, locked proximally and distally.  PREOPERATIVE INDICATIONS:  Kayla Kent is a 83 y.o. year old who fell and suffered a hip fracture. She was brought into the ER and then admitted and optimized and then elected for surgical intervention.    The risks benefits and alternatives were discussed with the patient including but not limited to the risks of nonoperative treatment, versus surgical intervention including infection, bleeding, nerve injury, malunion, nonunion, hardware prominence, hardware failure, need for hardware removal, blood clots, cardiopulmonary complications, morbidity, mortality, among others, and they were willing to proceed.    OPERATIVE PROCEDURE:  The patient was brought to the operating room and placed in the supine position on the [place] fracture table. General anesthesia was administered. She was placed on the fracture table.  Closed reduction was performed under C-arm guidance. Time out was then performed after sterile prep with Duraprep and draped into a sterile field with the shower curtain. She received preoperative antibiotics.  Incision was made proximal to the greater trochanter. A guidewire was placed centered on the greater trochanter. Confirmation was made on AP and lateral views. The femoral canal was then reamed over the wire. Wire and reamer were removed and the nail was inserted.  A guidepin was placed through the jig into the femoral head close to the center center position of the femoral head. The helical screw length was measured the cortex was reamed and the helical screw was inserted. C-arm was  used to verify position of the spiral blade.  The compression screw proximally lock the spiral blade.  The distal interlock was placed using the jig.  Instruments were removed and final C-arm pictures AP and lateral were obtained.  The wounds were irrigated  and closed with Vicryl followed by staples and Mepilex dressing. The patient was awakened and returned to PACU in stable and satisfactory condition. There no complications. Weightbearing as tolerated.  Newt Minion

## 2016-11-21 NOTE — Anesthesia Postprocedure Evaluation (Addendum)
Anesthesia Post Note  Patient: Kayla Kent  Procedure(s) Performed: Procedure(s) (LRB): INTRAMEDULLARY (IM) NAIL INTERTROCHANTRIC HEMI (Left)  Patient location during evaluation: PACU Anesthesia Type: General Level of consciousness: sedated Pain management: pain level controlled Vital Signs Assessment: post-procedure vital signs reviewed and stable Respiratory status: spontaneous breathing and respiratory function stable Cardiovascular status: stable Anesthetic complications: no       Last Vitals:  Vitals:   11/21/16 1115 11/21/16 1146  BP: (!) 143/69 (!) 123/58  Pulse: 92 84  Resp: 10 14  Temp: 36.7 C 36.3 C    Last Pain:  Vitals:   11/21/16 1146  TempSrc: Axillary  PainSc: 3                  Prabhjot Piscitello DANIEL

## 2016-11-21 NOTE — Anesthesia Preprocedure Evaluation (Addendum)
Anesthesia Evaluation  Patient identified by MRN, date of birth, ID band Patient awake    Reviewed: Allergy & Precautions, NPO status , Patient's Chart, lab work & pertinent test results  History of Anesthesia Complications Negative for: history of anesthetic complications  Airway Mallampati: II  TM Distance: >3 FB Neck ROM: Full    Dental no notable dental hx. (+) Dental Advisory Given   Pulmonary former smoker,    Pulmonary exam normal        Cardiovascular hypertension, Pt. on medications + Peripheral Vascular Disease and +CHF  + dysrhythmias Atrial Fibrillation and Supra Ventricular Tachycardia  Rhythm:Irregular Rate:Tachycardia     Neuro/Psych PSYCHIATRIC DISORDERS Anxiety Depression TIA   GI/Hepatic negative GI ROS, Neg liver ROS,   Endo/Other    Renal/GU negative Renal ROS     Musculoskeletal   Abdominal (+)  Abdomen: soft.    Peds  Hematology   Anesthesia Other Findings   Reproductive/Obstetrics                            Anesthesia Physical  Anesthesia Plan  ASA: III  Anesthesia Plan: General   Post-op Pain Management:    Induction: Intravenous  Airway Management Planned: Oral ETT  Additional Equipment:   Intra-op Plan:   Post-operative Plan: Extubation in OR  Informed Consent: I have reviewed the patients History and Physical, chart, labs and discussed the procedure including the risks, benefits and alternatives for the proposed anesthesia with the patient or authorized representative who has indicated his/her understanding and acceptance.   Dental advisory given  Plan Discussed with: CRNA, Anesthesiologist and Surgeon  Anesthesia Plan Comments:       Anesthesia Quick Evaluation

## 2016-11-21 NOTE — Transfer of Care (Signed)
Immediate Anesthesia Transfer of Care Note  Patient: Kayla Kent  Procedure(s) Performed: Procedure(s): INTRAMEDULLARY (IM) NAIL INTERTROCHANTRIC HEMI (Left)  Patient Location: PACU  Anesthesia Type:General  Level of Consciousness: awake, alert , oriented and patient cooperative  Airway & Oxygen Therapy: Patient Spontanous Breathing and Patient connected to nasal cannula oxygen  Post-op Assessment: Report given to RN, Post -op Vital signs reviewed and stable and Patient moving all extremities  Post vital signs: Reviewed and stable  Last Vitals:  Vitals:   11/21/16 0557 11/21/16 0900  BP: (!) 151/77 (!) 170/80  Pulse: (!) 103 (!) 110  Resp:    Temp: 37 C 36.7 C    Last Pain:  Vitals:   11/21/16 0900  TempSrc: Oral  PainSc:       Patients Stated Pain Goal: 2 (81/59/47 0761)  Complications: No apparent anesthesia complications

## 2016-11-21 NOTE — NC FL2 (Signed)
[place] LEVEL OF CARE SCREENING TOOL     IDENTIFICATION  Patient Name: Kayla Kent Birthdate: May 22, 1932 Sex: female Admission Date (Current Location): 11/20/2016  [facility] and [place] Number:  Herbalist and Address:  [facility] [place] [address], [place], [place] [address]      Provider Number: [id number]  Attending Physician Name and Address:  Lavina Hamman, MD  Relative Name and Phone Number:  Dtr Collene Gobble [phone]    Current Level of Care: Hospital Recommended Level of Care: [place] Prior Approval Number:    Date Approved/Denied:   PASRR Number: [id number]  Discharge Plan: SNF    Current Diagnoses: Patient Active Problem List   Diagnosis Date Noted  . Closed femur fracture ([place]) 11/20/2016  . Pedal edema 10/10/2016  . Persistent atrial fibrillation ([place]) 12/11/2014  . S/P ablation of atrial fibrillation 12/11/2014  . Orthostatic hypotension 12/11/2014  . Paroxysmal SVT (supraventricular tachycardia) ([place]) 12/11/2014  . Atherosclerotic peripheral vascular disease ([place]) 12/11/2014  . PAF (paroxysmal atrial fibrillation) ([place]) 11/05/2014  . Orthostasis 10/03/2014  . Non-compliant behavior 10/01/2014  . TIA (transient ischemic attack) 09/23/2014  . Numbness of left hand   . Acute on chronic diastolic heart failure ([place]) 09/21/2014  . Fracture of femoral neck, right ([place]) 08/28/2014  . Distal radius fracture, right 08/28/2014  . Hip fracture requiring operative repair ([place]) 08/28/2014  . Paroxysmal spells 03/28/2014  . Seizures ([place]) 03/28/2013  . Syncope and collapse 07/27/2012  . Routine health maintenance 12/12/2011  . PERIPHERAL CIRCULATORY DISORDER 04/10/2010  . Chronic diastolic CHF (congestive heart failure) ([place]) 07/17/2009  . Depression 07/04/2009  . Anxiety 01/19/2008  . HLD (hyperlipidemia) 11/14/2007  . Essential hypertension 11/14/2007  . PAROXYSMAL ATRIAL  TACHYCARDIA 11/14/2007  . UTERINE PROLAPSE 11/14/2007    Orientation RESPIRATION BLADDER Height & Weight     Self, Time, Situation, Place  Normal Continent Weight: 110 lb 12.8 oz (50.3 kg) Height:  '5\' 7"'$  (170.2 cm)  BEHAVIORAL SYMPTOMS/MOOD NEUROLOGICAL BOWEL NUTRITION STATUS      Continent Diet (See DC Summary)  AMBULATORY STATUS COMMUNICATION OF NEEDS Skin   Limited Assist Verbally Normal                       Personal Care Assistance Level of Assistance  Bathing, Dressing Bathing Assistance: Limited assistance   Dressing Assistance: Limited assistance     Functional Limitations Info  Sight, Hearing, Speech Sight Info: Adequate Hearing Info: Adequate Speech Info: Adequate    SPECIAL CARE FACTORS FREQUENCY  PT (By licensed PT), OT (By licensed OT)     PT Frequency: 5x wk OT Frequency: 5x wk            Contractures Contractures Info: Not present    Additional Factors Info  Code Status, Allergies Code Status Info: Full Allergies Info: Chocolate, Codeine, Diazepam, Fruit & Vegetable Daily Nutritional Supplements, Iohexol, Latex, Peach Prunus Persica, Peanut-containing Drug Products, Penicillins, Strawberry Extract, Sulfonamide Derivatives, Wheat, Wheat Bran, Sulfamethoxazole, Crestor Rosuvastatin Calcium, Iodine           Current Medications (11/21/2016):  This is the current hospital active medication list Current Facility-Administered Medications  Medication Dose Route Frequency Provider Last Rate Last Dose  . 0.9 %  sodium chloride infusion   Intravenous Continuous Carmin Muskrat, MD 125 mL/hr at 11/20/16 1531    . acetaminophen (TYLENOL) tablet 650 mg  650 mg Oral Q6H PRN  Newt Minion, MD       Or  . acetaminophen (TYLENOL) suppository 650 mg  650 mg Rectal Q6H PRN Newt Minion, MD      . ALPRAZolam Duanne Moron) tablet 0.25 mg  0.25 mg Oral BID PRN Shon Millet, DO   0.25 mg at 11/20/16 2306  . amLODipine (NORVASC) tablet 2.5 mg  2.5 mg Oral  Daily Shon Millet, DO   2.5 mg at 11/21/16 6606  . atorvastatin (LIPITOR) tablet 10 mg  10 mg Oral q1800 [facility] Choi, DO      . clindamycin (CLEOCIN) IVPB 600 mg  600 mg Intravenous Q6H Newt Minion, MD   600 mg at 11/21/16 1311  . diltiazem (CARDIZEM CD) 24 hr capsule 180 mg  180 mg Oral Daily Shon Millet, DO   180 mg at 11/21/16 3016  . enoxaparin (LOVENOX) injection 40 mg  40 mg Subcutaneous Q24H Shon Millet, DO      . HYDROcodone-acetaminophen (NORCO/VICODIN) 5-325 MG per tablet 1-2 tablet  1-2 tablet Oral Q6H PRN Newt Minion, MD      . lactated ringers infusion   Intravenous Continuous Meridee Score V, MD      . levETIRAcetam (KEPPRA) tablet 250 mg  250 mg Oral BID Shon Millet, DO   250 mg at 11/21/16 0109  . menthol-cetylpyridinium (CEPACOL) lozenge 3 mg  1 lozenge Oral PRN Newt Minion, MD       Or  . phenol (CHLORASEPTIC) mouth spray 1 spray  1 spray Mouth/Throat PRN Newt Minion, MD      . metoCLOPramide (REGLAN) tablet 5-10 mg  5-10 mg Oral Q8H PRN Newt Minion, MD       Or  . metoCLOPramide (REGLAN) injection 5-10 mg  5-10 mg Intravenous Q8H PRN Meridee Score V, MD      . morphine 2 MG/ML injection 0.5 mg  0.5 mg Intravenous Q3H PRN Jani Gravel, MD   0.5 mg at 11/21/16 0545  . morphine 2 MG/ML injection 0.5 mg  0.5 mg Intravenous Q2H PRN Newt Minion, MD   0.5 mg at 11/21/16 1308  . ondansetron (ZOFRAN) tablet 4 mg  4 mg Oral Q6H PRN Newt Minion, MD       Or  . ondansetron [facility]) injection 4 mg  4 mg Intravenous Q6H PRN Mer[place] Score V, MD      . sodium chloride flush (NS) 0.9 % injection 3 mL  3 mL Intravenous Q12H Shon Millet, DO      . traMADol Veatrice [place]) tablet 50 mg  50 mg Oral Q6H PRN Jani Gravel, MD   50 mg at 11/20/16 2319     Discharge Medications: Please see discharge summary for a list of discharge medications.  Relevant Imaging Results:  Relevant Lab Results:   Additional  Information [place], Tar[place]ares Lant B, LCSWA

## 2016-11-21 NOTE — Clinical Social Work Placement (Signed)
   CLINICAL SOCIAL WORK PLACEMENT  NOTE  Date:  11/21/2016  Patient Details  Name: Kayla Kent MRN: [id number] Date of Birth: December 25, 1931  Clinical Social Work is seeking post-discharge placement for this patient at the [place] level of care (*CSW will initial, date and re-position this form in  chart as items are completed):  Yes   Patient/family provided with [place] Work Department's list of facilities offering this level of care within the geographic area requested by the patient (or if unable, by the patient's family).  Yes   Patient/family informed of their freedom to choose among providers that offer the needed level of care, that participate in Medicare, Medicaid or managed care program needed by the patient, have an available bed and are willing to accept the patient.  Yes   Patient/family informed of 's ownership interest in [facility] and [facility], as well as of the fact that they are under no obligation to receive care at these facilities.  PASRR submitted to EDS on       PASRR number received on 08/30/14     Existing PASRR number confirmed on 11/21/16     FL2 transmitted to all facilities in geographic area requested by pt/family on 11/21/16     FL2 transmitted to all facilities within larger geographic area on       Patient informed that his/her managed care company has contracts with or will negotiate with certain facilities, including the following:            Patient/family informed of bed offers received.  Patient chooses bed at       Physician recommends and patient chooses bed at      Patient to be transferred to   on  .  Patient to be transferred to facility by       Patient family notified on   of transfer.  Name of family member notified:        PHYSICIAN Please sign FL2     Additional Comment:    _______________________________________________ Serafina Mitchell, LCSWA 11/21/2016, 2:59  PM

## 2016-11-21 NOTE — Anesthesia Procedure Notes (Signed)
Procedure Name: Intubation Date/Time: 11/21/2016 9:42 AM Performed by: Myna Bright Pre-anesthesia Checklist: Patient identified, Emergency Drugs available, Suction available and Patient being monitored Patient Re-evaluated:Patient Re-evaluated prior to inductionOxygen Delivery Method: Circle system utilized Preoxygenation: Pre-oxygenation with 100% oxygen Intubation Type: IV induction Ventilation: Mask ventilation without difficulty Laryngoscope Size: Mac and 3 Grade View: Grade II Tube type: Oral Tube size: 7.0 mm Number of attempts: 1 Airway Equipment and Method: Stylet Placement Confirmation: ETT inserted through vocal cords under direct vision,  positive ETCO2 and breath sounds checked- equal and bilateral Secured at: 21 cm Tube secured with: Tape

## 2016-11-21 NOTE — Progress Notes (Signed)
[facility] Progress Note  Patient: Kayla Kent SEG:[id number]   PCP: Cathlean Cower, MD DOB: September 14, 1932   DOA: 11/20/2016   DOS: 11/21/2016   Date of Service: the patient was seen and examined on 11/21/2016   Subjective: Feeling better, continues to have some pain in the left hip. No chest pain or shortness of breath.  Brief hospital course: Pt. with PMH of A. fib, HTN, seizures, HLD, chronic anticoagulation with Eliquis; admitted on 11/20/2016, with complaint of a mechanical fall after tripping over her dog, was found to have left intratrochanteric hip fracture. Currently further plan is to monitor postoperative course. He  Assessment and Plan: 1. Closed femur fracture ([place]) S/P intramedullary nail placement 11/21/2016. Appreciate orthopedic input. We'll monitor for pain management as well as PTOT recommendation. Monitor daily CBC and H&H.  2. A. fib, on chronic anticoagulation. Last dose of Eliquis was on 11/20/2016 morning. We will await further recommendations from orthopedics regarding resumption off the anticoagulation. Currently rate controlled. Still in A. fib. Continue oral Cardizem.  3. Essential hypertension. Continue Cardizem and Norvasc.  4. History of seizures. No active seizures reported. Continue Keppra 250 twice a day.  5. Generalized anxiety. On Xanax continue twice a day as needed per  Bowel regimen: last BM 11/19/2016 Diet: Currently nothing by mouth for surgery, postsurgery cardiac diet as tolerated DVT Prophylaxis: mechanical compression device and also on therapeutic anticoagulation. resumed when surgery is comfortable  Advance goals of care discussion:  Full code  Family Communication: no family was present at bedside, at the time of interview.  Disposition:  Discharge to home with home health. Expected discharge date: 11/22/2016,  Consultants: orthopedics Procedures: S/P intramedullary nail placement 11/21/2016.  Antibiotics: Anti-infectives      Start     Dose/Rate Route Frequency Ordered Stop   11/21/16 0918  clindamycin (CLEOCIN) 900 MG/50ML IVPB    Comments:  Merryl Hacker   : cabinet override      11/21/16 0918 11/21/16 0946   11/21/16 0845  clindamycin (CLEOCIN) IVPB 900 mg     900 mg 100 mL/hr over 30 Minutes Intravenous On call to O.R. 11/21/16 0833 11/21/16 0946        Objective: Physical Exam: Vitals:   11/21/16 0557 11/21/16 0900 11/21/16 1045 11/21/16 1100  BP: (!) 151/77 (!) 170/80 134/75 (!) 145/77  Pulse: (!) 103 (!) 110 (!) 103 89  Resp:   17 13  Temp: 98.6 F (37 C) 98 F (36.7 C) 97.8 F (36.6 C)   TempSrc: Oral Oral    SpO2: 100% 100% 98% 100%  Weight:      Height:        Intake/Output Summary (Last 24 hours) at 11/21/16 1127 Last data filed at 11/21/16 1100  Gross per 24 hour  Intake              500 ml  Output             1550 ml  Net            -1050 ml   Filed Weights   11/20/16 2238  Weight: 50.3 kg (110 lb 12.8 oz)    General: Alert, Awake and Oriented to Time, Place and Person. Appear in mild distress, affect appropriate Eyes: PERRL, Conjunctiva normal ENT: Oral Mucosa clear moist Neck: no JVD, no Abnormal Mass Or lumps Cardiovascular: S1 and S2 Present, no Murmur, Respiratory: Bilateral Air entry equal and Decreased, no use of accessory muscle, Clear to Auscultation, no Crackles,  no wheezes Abdomen: Bowel Sound present, Soft and no tenderness Skin: no redness, n Rash, no induration Extremities: no Pedal edema, no calf tenderness Neurologic: Grossly no focal neuro deficit. Bilaterally Equal motor strength  Data Reviewed: CBC:  Recent Labs Lab 11/20/16 1526 11/21/16 0118  WBC 11.4* 9.4  NEUTROABS 9.3*  --   HGB 12.5 11.8*  HCT 38.8 37.1  MCV 88.6 89.4  PLT 176 287   Basic Metabolic Panel:  Recent Labs Lab 11/20/16 1526 11/21/16 0118  NA 139 138  K 4.8 4.3  CL 106 105  CO2 24 24  GLUCOSE 221* 144*  BUN 20 12  CREATININE 1.09* 0.81  CALCIUM 8.7* 8.5*     Liver Function Tests: No results for input(s): AST, ALT, ALKPHOS, BILITOT, PROT, ALBUMIN in the last 168 hours. No results for input(s): LIPASE, AMYLASE in the last 168 hours. No results for input(s): AMMONIA in the last 168 hours. Coagulation Profile:  Recent Labs Lab 11/20/16 1526  INR 1.16   Cardiac Enzymes: No results for input(s): CKTOTAL, CKMB, CKMBINDEX, TROPONINI in the last 168 hours. BNP (last 3 results)  Recent Labs  10/22/16 1135  PROBNP 410.0*    CBG: No results for input(s): GLUCAP in the last 168 hours.  Studies: Dg Chest 1 View  Result Date: 11/20/2016 CLINICAL DATA:  Fall EXAM: CHEST 1 VIEW COMPARISON:  09/23/2014 chest radiograph. FINDINGS: Stable cardiomediastinal silhouette with mild cardiomegaly and aortic atherosclerosis. No pneumothorax. No pleural effusion. No overt pulmonary edema. Mild scarring at the left lung base. No acute consolidative airspace disease. No displaced fractures in the chest. IMPRESSION: Stable mild cardiomegaly. No overt pulmonary edema. No acute pulmonary disease. Mild scarring at the left lung base. Aortic atherosclerosis. Electronically Signed   By: Ilona Sorrel M.D.   On: 11/20/2016 16:49   Dg Hip Unilat With Pelvis 2-3 Views Left  Result Date: 11/20/2016 CLINICAL DATA:  Left hip pain after fall today EXAM: DG HIP (WITH OR WITHOUT PELVIS) 2-3V LEFT COMPARISON:  None. FINDINGS: Comminuted acute intertrochanteric left proximal femur fracture with mild impaction, noting a butterfly fragment involving the lesser trochanter. No dislocation at the left hip joint. Right hip hemiarthroplasty with no evidence of right hip dislocation on the single frontal pelvic view. No pelvic diastasis. No suspicious focal osseous lesion. IMPRESSION: Acute comminuted intertrochanteric left proximal femur fracture. Electronically Signed   By: Ilona Sorrel M.D.   On: 11/20/2016 16:48     Scheduled Meds: . amLODipine  2.5 mg Oral Daily  . atorvastatin  10  mg Oral q1800  . diltiazem  180 mg Oral Daily  . enoxaparin (LOVENOX) injection  40 mg Subcutaneous Q24H  . levETIRAcetam  250 mg Oral BID  . sodium chloride flush  3 mL Intravenous Q12H   Continuous Infusions: . sodium chloride 125 mL/hr at 11/20/16 1531  . lactated ringers     PRN Meds: ALPRAZolam, morphine injection, traMADol  Time spent: 30 minutes  Author: Berle Mull, MD [facility] Pager: [phone] 11/21/2016 11:27 AM  If 7PM-7AM, please contact night-coverage at [email or web address], password [facility]

## 2016-11-21 NOTE — Consult Note (Signed)
ORTHOPAEDIC CONSULTATION  REQUESTING PHYSICIAN: Lavina Hamman, MD  Chief Complaint: Left hip pain  HPI: Kayla Kent is a 83 y.o. female who presents with a ground-level fall with a left intertrochanteric hip fracture  Past Medical History:  Diagnosis Date  . Allergic rhinitis   . Anxiety   . HLD (hyperlipidemia)   . HTN (hypertension)   . Intolerance of drug    orthostatic  . Paroxysmal supraventricular tachycardia ([place])   . PVD (peripheral vascular disease) ([place])   . Syncope and collapse   . Uterine prolapse without mention of vaginal wall prolapse    Past Surgical History:  Procedure Laterality Date  . CARDIOVERSION N/A 11/05/2014   Procedure: CARDIOVERSION;  Surgeon: Candee Furbish, MD;  Location: [facility] ENDOSCOPY;  Service: Cardiovascular;  Laterality: N/A;  . cataract surgery  08/2015  . CHOLECYSTECTOMY    . corrective eye surgery     as a child  . IVD removed    . OPEN REDUCTION INTERNAL FIXATION (ORIF) DISTAL RADIAL FRACTURE Right 08/29/2014   Procedure: OPEN REDUCTION INTERNAL FIXATION (ORIF) DISTAL RADIAL FRACTURE;  Surgeon: Marianna Payment, MD;  Location: [place];  Service: Orthopedics;  Laterality: Right;  . RF ablation PSVT     summer '10  . stress cardiolite  08/05/93  . TONSILLECTOMY    . TOTAL HIP ARTHROPLASTY Right 08/29/2014   Procedure: Right Hip Hemi Arthroplasty;  Surgeon: Marianna Payment, MD;  Location: [place];  Service: Orthopedics;  Laterality: Right;  Hip procedure 1st wants Peg Board, [employer], [place].    Social History   Social History  . Marital status: Widowed    Spouse name: N/A  . Number of children: 2  . Years of education: 69   Occupational History  . retired Radio producer    Social History Main Topics  . Smoking status: Former Smoker    Quit date: 12/08/1955  . Smokeless tobacco: Never Used     Comment: quit in 1995   . Alcohol use No  . Drug use: No  . Sexual activity: No   Other Topics Concern  . None    Social History Narrative   [employer], [employer]-BA, [employer]-early childhood. Married '55 -37 years.  1 son - 69; 1 daughter - 2; 3 grandchildren . Lives alone. ACP - discussed and provided packet on end of life care (Feb '13)   Patient is now widowed.   Patient is right-handed.   Patient drinks tea daily.   Family History  Problem Relation Age of Onset  . Mental illness Father     suicide  . Arthritis Father   . Hyperlipidemia Sister   . Hypertension Sister   . Heart disease Brother     CAD/MI  . Hypertension Brother   . Coronary artery disease Brother   . Hypertension      family hx  . Heart attack Brother   . Colon cancer Neg Hx   . Breast cancer Neg Hx   . Diabetes Neg Hx   . Stroke Neg Hx    - negative except otherwise stated in the family history section Allergies  Allergen Reactions  . Chocolate Hives  . Codeine Other (See Comments)    Patient states she acts crazy  . Diazepam Other (See Comments)    Patient states she acts crazy.  . Fruit & Vegetable Daily [Nutritional Supplements] Hives and Swelling    peaches  . Iohexol Hives     Code:  HIVES, Desc: pt gets 13 hr pre-meds, Onset Date: [id number]   . Latex Hives  . Peach [Prunus Persica] Hives  . Peanut-Containing Drug Products Hives and Swelling  . Penicillins Hives, Itching and Swelling    Has patient had a PCN reaction causing immediate rash, facial/tongue/throat swelling, SOB or lightheadedness with hypotension: Yes Has patient had a PCN reaction causing severe rash involving mucus membranes or skin necrosis: No Has patient had a PCN reaction that required hospitalization No Has patient had a PCN reaction occurring within the last 10 years: No If all of the above answers are "NO", then may proceed with Cephalosporin use.   . Strawberry Extract Hives  . Sulfonamide Derivatives Hives    nausea  . Wheat Shortness Of Breath    Shortness of breath  . Wheat Bran Shortness Of Breath  .  Sulfamethoxazole Hives  . Crestor [Rosuvastatin Calcium] Rash  . Iodine Rash   Prior to Admission medications   Medication Sig Start Date End Date Taking? Authorizing Provider  ALPRAZolam (XANAX) 0.25 MG tablet Take 1 tablet (0.25 mg total) by mouth 2 (two) times daily as needed for anxiety. Patient taking differently: Take 0.25 mg by mouth 2 (two) times daily as needed for anxiety or sleep.  10/22/16  Yes Biagio Borg, MD  amLODipine (NORVASC) 2.5 MG tablet Take 1 tablet (2.5 mg total) by mouth daily. 08/06/16 08/01/17 Yes Fay Records, MD  atorvastatin (LIPITOR) 10 MG tablet TAKE ONE TABLET BY MOUTH ONCE DAILY AT 6 PM 09/06/16  Yes Fay Records, MD  diltiazem (DILACOR XR) 180 MG 24 hr capsule TAKE ONE CAPSULE BY MOUTH ONCE DAILY 11/05/16  Yes Biagio Borg, MD  ELIQUIS 2.5 MG TABS tablet TAKE ONE TABLET BY MOUTH TWICE DAILY 10/08/16  Yes Fay Records, MD  levETIRAcetam (KEPPRA) 250 MG tablet TAKE ONE TABLET BY MOUTH TWICE DAILY 11/08/16  Yes Biagio Borg, MD  zolpidem (AMBIEN) 5 MG tablet Take 1 tablet (5 mg total) by mouth at bedtime as needed. for insomnia Patient taking differently: Take 5 mg by mouth at bedtime as needed for sleep.  10/06/16  Yes Biagio Borg, MD   Dg Chest 1 View  Result Date: 11/20/2016 CLINICAL DATA:  Fall EXAM: CHEST 1 VIEW COMPARISON:  09/23/2014 chest radiograph. FINDINGS: Stable cardiomediastinal silhouette with mild cardiomegaly and aortic atherosclerosis. No pneumothorax. No pleural effusion. No overt pulmonary edema. Mild scarring at the left lung base. No acute consolidative airspace disease. No displaced fractures in the chest. IMPRESSION: Stable mild cardiomegaly. No overt pulmonary edema. No acute pulmonary disease. Mild scarring at the left lung base. Aortic atherosclerosis. Electronically Signed   By: Ilona Sorrel M.D.   On: 11/20/2016 16:49   Dg Hip Unilat With Pelvis 2-3 Views Left  Result Date: 11/20/2016 CLINICAL DATA:  Left hip pain after fall today EXAM: DG  HIP (WITH OR WITHOUT PELVIS) 2-3V LEFT COMPARISON:  None. FINDINGS: Comminuted acute intertrochanteric left proximal femur fracture with mild impaction, noting a butterfly fragment involving the lesser trochanter. No dislocation at the left hip joint. Right hip hemiarthroplasty with no evidence of right hip dislocation on the single frontal pelvic view. No pelvic diastasis. No suspicious focal osseous lesion. IMPRESSION: Acute comminuted intertrochanteric left proximal femur fracture. Electronically Signed   By: Ilona Sorrel M.D.   On: 11/20/2016 16:48   - pertinent xrays, CT, MRI studies were reviewed and independently interpreted  Positive ROS: All other systems have been reviewed and were  otherwise negative with the exception of those mentioned in the HPI and as above.  Physical Exam: General: Alert, no acute distress Psychiatric: Patient is competent for consent with normal mood and affect Lymphatic: No axillary or cervical lymphadenopathy Cardiovascular: No pedal edema Respiratory: No cyanosis, no use of accessory musculature GI: No organomegaly, abdomen is soft and non-tender  Skin: Skin is intact no ulcerations.   Neurologic: Patient does have protective sensation bilateral lower extremities.   MUSCULOSKELETAL:  Examination patient has a short Rotated left lower extremity rotation reproduces hip pain. Radiographs shows a intertrochanteric hip fracture on the left.  Assessment: Assessment: Left intertrochanteric hip fracture.  Plan: Plan: We'll plan for internal fixation of the left intertrochanteric hip fracture with an intramedullary nail. Risks and benefits are discussed including infection neurovascular injury persistent pain and need for additional surgery. Patient states she understands wishes to proceed at this time.  Thank you for the consult and the opportunity to see Ms. Rudell Cobb, [place] [phone] 6:39 AM

## 2016-11-22 ENCOUNTER — Encounter (HOSPITAL_COMMUNITY): Payer: Self-pay | Admitting: Orthopedic Surgery

## 2016-11-22 LAB — CBC
HCT: 28.2 % — ABNORMAL LOW (ref 36.0–46.0)
Hemoglobin: 9.1 g/dL — ABNORMAL LOW (ref 12.0–15.0)
MCH: 29 pg (ref 26.0–34.0)
MCHC: 32.3 g/dL (ref 30.0–36.0)
MCV: 89.8 fL (ref 78.0–100.0)
Platelets: 144 10*3/uL — ABNORMAL LOW (ref 150–400)
RBC: 3.14 MIL/uL — ABNORMAL LOW (ref 3.87–5.11)
RDW: 15.8 % — ABNORMAL HIGH (ref 11.5–15.5)
WBC: 11.2 10*3/uL — ABNORMAL HIGH (ref 4.0–10.5)

## 2016-11-22 LAB — BASIC METABOLIC PANEL WITH GFR
Anion gap: 9 (ref 5–15)
BUN: 20 mg/dL (ref 6–20)
CO2: 25 mmol/L (ref 22–32)
Calcium: 8.2 mg/dL — ABNORMAL LOW (ref 8.9–10.3)
Chloride: 102 mmol/L (ref 101–111)
Creatinine, Ser: 1.35 mg/dL — ABNORMAL HIGH (ref 0.44–1.00)
GFR calc Af Amer: 41 mL/min — ABNORMAL LOW
GFR calc non Af Amer: 35 mL/min — ABNORMAL LOW
Glucose, Bld: 110 mg/dL — ABNORMAL HIGH (ref 65–99)
Potassium: 4.9 mmol/L (ref 3.5–5.1)
Sodium: 136 mmol/L (ref 135–145)

## 2016-11-22 LAB — ABO/RH: ABO/RH(D): B POS

## 2016-11-22 MED ORDER — APIXABAN 2.5 MG PO TABS
2.5000 mg | ORAL_TABLET | Freq: Two times a day (BID) | ORAL | Status: DC
Start: 2016-11-22 — End: 2016-11-23
  Administered 2016-11-22 (×2): 2.5 mg via ORAL
  Filled 2016-11-22 (×2): qty 1

## 2016-11-22 MED ORDER — HYDROCODONE-ACETAMINOPHEN 5-325 MG PO TABS
1.0000 | ORAL_TABLET | ORAL | Status: DC | PRN
  Administered 2016-11-22: 1 via ORAL
  Filled 2016-11-22: qty 1

## 2016-11-22 MED ORDER — METHOCARBAMOL 500 MG PO TABS
500.0000 mg | ORAL_TABLET | Freq: Three times a day (TID) | ORAL | Status: DC
  Administered 2016-11-22 – 2016-11-25 (×11): 500 mg via ORAL
  Filled 2016-11-22 (×11): qty 1

## 2016-11-22 MED ORDER — ACETAMINOPHEN 500 MG PO TABS: 500.0000 mg | ORAL_TABLET | Freq: Four times a day (QID) | ORAL | 0 refills | Status: DC | PRN

## 2016-11-22 MED ORDER — SODIUM CHLORIDE 0.9 % IV SOLN: INTRAVENOUS | Status: DC

## 2016-11-22 MED ORDER — MORPHINE SULFATE (PF) 2 MG/ML IV SOLN: 2.0000 mg | INTRAVENOUS | Status: DC | PRN

## 2016-11-22 NOTE — Evaluation (Signed)
Occupational Therapy Evaluation Patient Details Name: Kayla Kent MRN: [id number] DOB: 1932/03/11 Today's Date: 11/22/2016    History of Present Illness 83 y.o. female with closed femur fracture s/p im intertrochanter nail on 2/4. PMH: HLD, afib, seizures.    Clinical Impression   Patient presenting with decreased I in self care, functional mobility/transfers, and balance with increase pain. Patient reports being I PTA. Patient currently functioning at min - max A ( LB self care). Patient will benefit from acute OT to increase overall independence in the areas of ADLs, functional mobility, and safety awareness in order to safely discharge to next venue of care.Pt standing from EOB with max A for lifting and lowering assistance. While standing, pt able to shift weight to L and R , alternating, and reports decrease in pain to 5/10 this session. Pt able to take 3 steps forward and backward with mod A when shifting weight onto L LE. Pt very anxious with movement but given increased time and detailed description of sequence and pt will attempt. Pt returning to bed with max A for sit >supine. Bed alarm activated and call bell within reach upon exiting the room.     Follow Up Recommendations  SNF    Equipment Recommendations  Other (comment) (TBD next venue of care)    Recommendations for Other Services       Precautions / Restrictions Precautions Precautions: Fall Restrictions Weight Bearing Restrictions: Yes LLE Weight Bearing: Weight bearing as tolerated      Mobility Bed Mobility Overal bed mobility: Needs Assistance Bed Mobility: Supine to Sit     Supine to sit: Mod assist     General bed mobility comments: mod A for trunk and L LE from flat bed.  Transfers Overall transfer level: Needs assistance Equipment used: Rolling walker (2 wheeled) Transfers: Sit to/from [facility] Sit to Stand: Max assist Stand pivot transfers: Max assist       General  transfer comment: max A for lifting and lowering assistance    Balance Overall balance assessment: Needs assistance Sitting-balance support: Feet supported;Single extremity supported Sitting balance-Leahy Scale: Fair     Standing balance support: Bilateral upper extremity supported Standing balance-Leahy Scale: Poor Standing balance comment: pt requires v/c for upright posture and proper use of AD                 ADL Overall ADL's : Needs assistance/impaired                General ADL Comments: Pt currently requires set up A for UB self care while seated and mod A for LB self care at this time. Pt able to don sock onto R LE but unable to thread onto L secondary to pain and discomfort.               Pertinent Vitals/Pain Pain Assessment: 0-10 Pain Score: 6  Pain Location: L hip Pain Descriptors / Indicators: Discomfort Pain Intervention(s): Monitored during session;Repositioned;Premedicated before session     Hand Dominance Right   Extremity/Trunk Assessment Upper Extremity Assessment Upper Extremity Assessment: Generalized weakness   Lower Extremity Assessment Lower Extremity Assessment: Defer to PT evaluation   Cervical / Trunk Assessment Cervical / Trunk Assessment: Normal   Communication Communication Communication: No difficulties   Cognition Arousal/Alertness: Awake/alert Behavior During Therapy: Anxious Overall Cognitive Status: Within Functional Limits for tasks assessed  Home Living Family/patient expects to be discharged to:: Skilled nursing facility                Prior Functioning/Environment Level of Independence: Independent        Comments: pt lives alone and was independent with all activities. Pt was driving and able to keep up with house and yardwork. pt has 1-2 steps at each entrance of home.         OT Problem List: Decreased strength;Decreased range of motion;Decreased  activity tolerance;Impaired balance (sitting and/or standing);Decreased safety awareness;Pain;Decreased knowledge of use of DME or AE   OT Treatment/Interventions: Self-care/ADL training;Therapeutic exercise;Energy conservation;DME and/or AE instruction;Manual therapy;Patient/family education;Balance training;Therapeutic activities    OT Goals(Current goals can be found in the care plan section) Acute Rehab OT Goals Patient Stated Goal: to walk and decrease pain OT Goal Formulation: With patient Time For Goal Achievement: 12/06/16 Potential to Achieve Goals: Fair ADL Goals Pt Will Perform Grooming: with set-up;sitting Pt Will Perform Upper Body Bathing: with set-up;sitting Pt Will Perform Lower Body Bathing: with min assist Pt Will Perform Upper Body Dressing: with set-up;sitting Pt Will Perform Lower Body Dressing: with min assist Pt Will Transfer to Toilet: with min assist;ambulating;bedside commode Pt Will Perform Toileting - Clothing Manipulation and hygiene: with min assist;sit to/from stand  OT Frequency: Min 2X/week    End of Session Equipment Utilized During Treatment: Rolling walker  Activity Tolerance: Patient limited by fatigue;Patient limited by pain Patient left: in bed;with bed alarm set;with call bell/phone within reach   Time: 4128-7867 OT Time Calculation (min): 28 min Charges:  OT General Charges $OT Visit: 1 Procedure OT Evaluation $OT Eval Moderate Complexity: 1 Procedure OT Treatments $Therapeutic Activity: 8-22 mins G-Codes:    Gypsy Decant Dec 01, 2016, 4:00 PM

## 2016-11-22 NOTE — Consult Note (Signed)
       [facility] CM Primary Care Navigator  11/22/2016  Kayla Kent December 02, 1931 [id number]    Patient seen at the bedside to identify possible discharge needs. Patient shared that she tripped over her dog and fell on the concrete porch that had led to this admission. Patient states that she was transferred to [facility] from [place] for surgical intervention.   Patient endorses that primary care provider is Dr. Cathlean Cower with [facility]. Patient verbalized being able to drive prior to admission. Her daughter Kayla Kent) will assist with transportation to her doctors' appointments after discharge.  Patient states using [facility] to obtain medications without difficulty.   Patient verbalized that she manages her own medications at home using "pill box" system weekly.   Patient lives alone and independent with self care prior to admission. She states that daughter will be able to assist with her care at home as needed. Patient had mentioned that she might need someone at home to assist her after skilled facility discharge. [facility] list for personal care services was provided to patient and understands that she will have to pay out of the pocket for it.  Discharge plan is skilled nursing facility (SNF) per patient. She was provided a list of skilled nursing facilities to choose from.  Patient had expressed understanding to call primary care provider's office when she gets home, for a post discharge follow-up appointment within a week or sooner if needed. Patient letter (with PCP's contact number) was provided as a reminder.  Patient denies any care coordination needs and disease management (related to HF) at this time. She states "HF has not been an issue that needed managing so far". [facility] care management contact information provided for futureneeds that may arise.   For questions, please contact:  Dannielle Huh, BSN, RN- [facility] Primary Care Navigator   Telephone: [phone] Kayla Kent

## 2016-11-22 NOTE — Progress Notes (Signed)
[facility] Progress Note  Patient: Kayla Kent XTK:[id number]   PCP: Cathlean Cower, MD DOB: [birth year]   DOA: 11/20/2016   DOS: 11/22/2016   Date of Service: the patient was seen and examined on 11/22/2016   Subjective: Complains about pain in the operative leg and mentions some reservations regarding her discharge planning  Brief hospital course: Pt. with PMH of A. fib, HTN, seizures, HLD, chronic anticoagulation with Eliquis; admitted on 11/20/2016, with complaint of a mechanical fall after tripping over her dog, was found to have left intratrochanteric hip fracture. Currently further plan is to monitor postoperative course.  Assessment and Plan:  1. Closed femur fracture (HCC)  Postoperative acute blood loss anemia. S/P intramedullary nail placement 11/21/2016. Appreciate orthopedic input. We'll monitor for pain management as well as PTOT recommendation. Hemoglobin dropped from 12.5 on admission to 9.1, which continue to monitor recheck tomorrow. Transfuse for hemoglobin less than 8.  2. A. fib, on chronic anticoagulation. Last dose of Eliquis was on 11/20/2016 morning. Currently rate controlled. Still in A. fib. Continue oral Cardizem. Discussed with orthopedics, safe to restart Eliquis, we will resume it and monitor overnight. Check CBC in 1 week on discharge  3. Essential hypertension. Continue Cardizem and Norvasc.  4. History of seizures. No active seizures reported. Continue Keppra 250 twice a day.  5. Generalized anxiety. On Xanax continue twice a day as needed per  Bowel regimen: last BM 11/19/2016 Diet: cardiac diet as tolerated DVT Prophylaxis: Eliquis  Advance goals of care discussion:  Full code  Family Communication: no family was present at bedside, at the time of interview.  Disposition:  Discharge to SNF, social worker working on the case Expected discharge date: 11/23/2016,  Consultants: orthopedics Procedures: S/P intramedullary nail placement  11/21/2016.  Antibiotics: Anti-infectives    Start     Dose/Rate Route Frequency Ordered Stop   11/21/16 1200  clindamycin (CLEOCIN) IVPB 600 mg     600 mg 100 mL/hr over 30 Minutes Intravenous Every 6 hours 11/21/16 1154 11/21/16 1843   11/21/16 0918  clindamycin (CLEOCIN) 900 MG/50ML IVPB    Comments:  Merryl Hacker   : cabinet override      11/21/16 0918 11/21/16 0946   11/21/16 0845  clindamycin (CLEOCIN) IVPB 900 mg     900 mg 100 mL/hr over 30 Minutes Intravenous On call to O.R. 11/21/16 0833 11/21/16 0946        Objective: Physical Exam: Vitals:   11/21/16 2013 11/22/16 0523 11/22/16 0802 11/22/16 1457  BP: (!) 104/44 (!) 127/54 (!) 115/41 (!) 114/34  Pulse: 64 77  64  Resp: '18 18  18  '$ Temp: 97.8 F (36.6 C) 98.1 F (36.7 C)  98 F (36.7 C)  TempSrc: Oral Oral  Oral  SpO2: 100% 97% 96% 95%  Weight:  53.1 kg (117 lb)    Height:        Intake/Output Summary (Last 24 hours) at 11/22/16 1505 Last data filed at 11/21/16 1700  Gross per 24 hour  Intake              240 ml  Output              200 ml  Net               40 ml   Filed Weights   11/20/16 2238 11/22/16 0523  Weight: 50.3 kg (110 lb 12.8 oz) 53.1 kg (117 lb)    General: Alert, Awake and Oriented to Time,  Place and Person. Appear in mild distress, affect appropriate Eyes: PERRL, Conjunctiva normal ENT: Oral Mucosa clear moist Neck: no JVD, no Abnormal Mass Or lumps Cardiovascular: S1 and S2 Present, no Murmur, Respiratory: Bilateral Air entry equal and Decreased, no use of accessory muscle, Clear to Auscultation, no Crackles, no wheezes Abdomen: Bowel Sound present, Soft and no tenderness Skin: no redness, n Rash, no induration Extremities: no Pedal edema, no calf tenderness Neurologic: Grossly no focal neuro deficit. Bilaterally Equal motor strength  Data Reviewed: CBC:  Recent Labs Lab 11/20/16 1526 11/21/16 0118 11/22/16 0234  WBC 11.4* 9.4 11.2*  NEUTROABS 9.3*  --   --   HGB 12.5  11.8* 9.1*  HCT 38.8 37.1 28.2*  MCV 88.6 89.4 89.8  PLT 176 161 629*   Basic Metabolic Panel:  Recent Labs Lab 11/20/16 1526 11/21/16 0118 11/22/16 0234  NA 139 138 136  K 4.8 4.3 4.9  CL 106 105 102  CO2 '24 24 25  '$ GLUCOSE 221* 144* 110*  BUN '20 12 20  '$ CREATININE 1.09* 0.81 1.35*  CALCIUM 8.7* 8.5* 8.2*    Liver Function Tests: No results for input(s): AST, ALT, ALKPHOS, BILITOT, PROT, ALBUMIN in the last 168 hours. No results for input(s): LIPASE, AMYLASE in the last 168 hours. No results for input(s): AMMONIA in the last 168 hours. Coagulation Profile:  Recent Labs Lab 11/20/16 1526  INR 1.16   Cardiac Enzymes: No results for input(s): CKTOTAL, CKMB, CKMBINDEX, TROPONINI in the last 168 hours. BNP (last 3 results)  Recent Labs  10/22/16 1135  PROBNP 410.0*    CBG: No results for input(s): GLUCAP in the last 168 hours.  Studies: No results found.   Scheduled Meds: . apixaban  2.5 mg Oral BID  . atorvastatin  10 mg Oral q1800  . diltiazem  180 mg Oral Daily  . levETIRAcetam  250 mg Oral BID  . methocarbamol  500 mg Oral Q8H  . sodium chloride flush  3 mL Intravenous Q12H   Continuous Infusions:  PRN Meds: acetaminophen **OR** acetaminophen, ALPRAZolam, HYDROcodone-acetaminophen, menthol-cetylpyridinium **OR** phenol, metoCLOPramide **OR** metoCLOPramide (REGLAN) injection, morphine injection, ondansetron **OR** ondansetron (ZOFRAN) IV, traMADol  Time spent: 30 minutes  Author: Berle Mull, MD [facility] Pager: [phone] 11/22/2016 3:05 PM  If 7PM-7AM, please contact night-coverage at [email or web address], password [facility]

## 2016-11-22 NOTE — Progress Notes (Signed)
ANTICOAGULATION CONSULT NOTE  Pharmacy Consult for apixaban Indication: atrial fibrillation   Assessment: 39 yof s/p fall with L proximal femur fracture and repair on 2/4. PMH of afib on apixaban - previously on hold this admit. Pharmacy consulted to resume on 2/5. Patient takes apixaban 2.'5mg'$  PO BID PTA - this is the appropriate dose based on age and weight. SCr 1.35, CrCl~25-30. Hg down to 9.1, plt down to 144. No bleed documented.  Previously on Lovenox prophylaxis this admit - last dose at 0802 this AM.  Goal of Therapy:  Stroke prevention Monitor platelets by anticoagulation protocol: Yes   Plan:  D/c Lovenox Resume apixaban 2.'5mg'$  PO BID from PTA Monitor CBC, s/sx bleeding Pharmacy will s/o consult and monitor peripherally   Elicia Lamp, PharmD, BCPS Clinical Pharmacist 11/22/2016 11:34 AM

## 2016-11-22 NOTE — Clinical Social Work Note (Signed)
Clinical Social Work Assessment  Patient Details  Name: Kayla Kent MRN: [id number] Date of Birth: 10/04/1932  Date of referral:  11/22/16               Reason for consult:  Discharge Planning                Permission sought to share information with:  Family Supports Permission granted to share information::  Yes, Verbal Permission Granted  Name::     Michelle Leonard   Agency::     Relationship::  daughter  Contact Information:  [phone]  Housing/Transportation Living arrangements for the past 2 months:  Single Family Home Source of Information:  Patient Patient Interpreter Needed:  None Criminal Activity/Legal Involvement Pertinent to Current Situation/Hospitalization:  No - Comment as needed Significant Relationships:  Adult Children Lives with:  Self Do you feel safe going back to the place where you live?  Yes Need for family participation in patient care:  Yes (Comment)  Care giving concerns:  Patient has family support  Social Worker assessment / plan:  Clinical Social Worker met with patient at bedside to offer support and discuss patient needs at discharge. Patient states that she has been at a facility in the past and is agreeable to go to SNF. Patient and daughter prefers [facility]. CSW to complete necessary paperwork and initiate SNF search on patients behalf. CSW to follow up with patient once bed offers are available. CSW remains available for support and to facilitate patient discharge once medically stable.   Employment status:  Retired Insurance information:  Medicare PT Recommendations:  Skilled Nursing Facility Information / Referral to community resources:  Skilled Nursing Facility  Patient/Family's Response to care:  Patient verbalized appreciation and understanding for CSW role and involvement in care. Patient agreeable  with current discharge plan to SNF  Patient/Family's Understanding of and Emotional Response to Diagnosis,  Current Treatment, and Prognosis: Patient with good understanding of current medical state and limitations around most recent hospitalization. Patient agreeable with SNF placement in hopes of transitioning to a more stable living environment.   Emotional Assessment Appearance:  Appears stated age Attitude/Demeanor/Rapport:  Other Affect (typically observed):  Calm, Pleasant Orientation:  Oriented to Self, Oriented to Situation, Oriented to Place, Oriented to  Time Alcohol / Substance use:  Not Applicable Psych involvement (Current and /or in the community):  No (Comment)  Discharge Needs  Concerns to be addressed:  No discharge needs identified Readmission within the last 30 days:  No Current discharge risk:  None Barriers to Discharge:  No Barriers Identified    C , LCSW 11/22/2016, 11:31 AM  

## 2016-11-22 NOTE — Progress Notes (Signed)
Patient ID: Kayla Kent, female   DOB: 12/23/1931, 83 y.o.   MRN: [id number] Postoperative day 1 intramedullary nailing left intertrochanteric hip fracture. Patient is alert without complaints this morning. Plan for physical therapy weightbearing as tolerated on the left anticipate discharge to skilled nursing. I will follow-up in the office in 2 weeks.

## 2016-11-22 NOTE — Evaluation (Signed)
Physical Therapy Evaluation Patient Details Name: Kayla Kent MRN: [id number] DOB: 11-19-1931 Today's Date: 11/22/2016   History of Present Illness  83 y.o. female with closed femur fracture s/p im intertrochanter nail on 2/4. PMH: HLD, afib, seizures.   Clinical Impression  Pt admit with above. PTA pt was independent, but now requires modA x 2 for all mobility for physical assist and safety. Pt would benefit from SNF for continued strengthening to return to mod-I. Acute PT will follow until d/c.     Follow Up Recommendations SNF    Equipment Recommendations  None recommended by PT (pt reports having RW and shower seat PTA)    Recommendations for Other Services       Precautions / Restrictions Precautions Precautions: Fall Restrictions Weight Bearing Restrictions: Yes LLE Weight Bearing: Weight bearing as tolerated      Mobility  Bed Mobility Overal bed mobility: Needs Assistance Bed Mobility: Supine to Sit     Supine to sit: Mod assist     General bed mobility comments: pt requires modA x 1 for moving L LE EOB and for pushing up trunk with HOB elevated  Transfers Overall transfer level: Needs assistance Equipment used: Rolling walker (2 wheeled) Transfers: Sit to/from [facility] Sit to Stand: Mod assist;+2 physical assistance Stand pivot transfers: Mod assist;+2 physical assistance       General transfer comment: pt requires modA x 2 for physical assist during power up and with initial transition from RW to chair   Ambulation/Gait             General Gait Details: limited to shuffled steps to chair, pt unable to clear R LE due to L LE weightbearing tolerance  Stairs            Wheelchair Mobility    Modified Rankin (Stroke Patients Only)       Balance Overall balance assessment: Needs assistance Sitting-balance support: Feet supported;Single extremity supported Sitting balance-Leahy Scale: Fair     Standing balance  support: Bilateral upper extremity supported Standing balance-Leahy Scale: Poor Standing balance comment: pt requires v/c for upright posture and proper use of AD                             Pertinent Vitals/Pain Pain Assessment: 0-10 Pain Score: 6  Pain Location: L hip Pain Descriptors / Indicators: Discomfort Pain Intervention(s): Limited activity within patient's tolerance;Monitored during session;Repositioned    Home Living Family/patient expects to be discharged to:: Skilled nursing facility                      Prior Function Level of Independence: Independent         Comments: pt lives alone and was independent with all activities. Pt was driving and able to keep up with house and yardwork. pt has 1-2 steps at each entrance of home.      Hand Dominance   Dominant Hand: Right    Extremity/Trunk Assessment   Upper Extremity Assessment Upper Extremity Assessment: Generalized weakness    Lower Extremity Assessment Lower Extremity Assessment: Generalized weakness;LLE deficits/detail LLE Deficits / Details: pt able to do quad set, but unable to perform LAQ, grossly a 2+/5     Cervical / Trunk Assessment Cervical / Trunk Assessment: Normal  Communication   Communication: No difficulties  Cognition Arousal/Alertness: Awake/alert Behavior During Therapy: Anxious Overall Cognitive Status: Within Functional Limits for tasks assessed  General Comments      Exercises General Exercises - Lower Extremity Ankle Circles/Pumps: AROM;Both;20 reps;Supine Quad Sets: AROM;Strengthening;Left;10 reps;Supine Long Arc Quad: AROM;Strengthening;Left;10 reps;Seated   Assessment/Plan    PT Assessment Patient needs continued PT services  PT Problem List Decreased strength;Decreased range of motion;Decreased activity tolerance;Decreased balance;Decreased mobility;Decreased knowledge of use of DME;Pain          PT Treatment  Interventions DME instruction;Gait training;Stair training;Therapeutic exercise;Balance training;Patient/family education    PT Goals (Current goals can be found in the Care Plan section)  Acute Rehab PT Goals Patient Stated Goal: go to rehab PT Goal Formulation: With patient Time For Goal Achievement: 12/06/16 Potential to Achieve Goals: Good    Frequency Min 5X/week   Barriers to discharge Inaccessible home environment;Decreased caregiver support pt lives alone and has family that can provide supervision intermittently so pt would be alone without physical assist majority of the time    Co-evaluation               End of Session Equipment Utilized During Treatment: Gait belt Activity Tolerance: Patient limited by pain Patient left: in chair;with call bell/phone within reach;with family/visitor present Nurse Communication: Mobility status         Time: 6861-6837 PT Time Calculation (min) (ACUTE ONLY): 29 min   Charges:   PT Evaluation $PT Eval Moderate Complexity: 1 Procedure PT Treatments $Therapeutic Activity: 8-22 mins   PT G Codes:        Shalla Bulluck 12-13-16, 2:30 PM  Tracie Harrier, SPT Acute Rehab SPT [phone]

## 2016-11-22 NOTE — Discharge Instructions (Addendum)
INSTRUCTIONS AFTER JOINT REPLACEMENT  ° °o Remove items at home which could result in a fall. This includes throw rugs or furniture in walking pathways °o ICE to the affected joint every three hours while awake for 30 minutes at a time, for at least the first 3-5 days, and then as needed for pain and swelling.  Continue to use ice for pain and swelling. You may notice swelling that will progress down to the foot and ankle.  This is normal after surgery.  Elevate your leg when you are not up walking on it.   °o Continue to use the breathing machine you got in the hospital (incentive spirometer) which will help keep your temperature down.  It is common for your temperature to cycle up and down following surgery, especially at night when you are not up moving around and exerting yourself.  The breathing machine keeps your lungs expanded and your temperature down. ° ° °DIET:  As you were doing prior to hospitalization, we recommend a well-balanced diet. ° °DRESSING / WOUND CARE / SHOWERING ° °You may change your dressing 3-5 days after surgery.  Then change the dressing every day with sterile gauze.  Please use good hand washing techniques before changing the dressing.  Do not use any lotions or creams on the incision until instructed by your surgeon. ° °ACTIVITY ° °o Increase activity slowly as tolerated, but follow the weight bearing instructions below.   °o No driving for 6 weeks or until further direction given by your physician.  You cannot drive while taking narcotics.  °o No lifting or carrying greater than 10 lbs. until further directed by your surgeon. °o Avoid periods of inactivity such as sitting longer than an hour when not asleep. This helps prevent blood clots.  °o You may return to work once you are authorized by your doctor.  ° ° ° °WEIGHT BEARING  ° °Weight bearing as tolerated with assist device (walker, cane, etc) as directed, use it as long as suggested by your surgeon or therapist, typically at  least 4-6 weeks. ° ° °EXERCISES ° °Results after joint replacement surgery are often greatly improved when you follow the exercise, range of motion and muscle strengthening exercises prescribed by your doctor. Safety measures are also important to protect the joint from further injury. Any time any of these exercises cause you to have increased pain or swelling, decrease what you are doing until you are comfortable again and then slowly increase them. If you have problems or questions, call your caregiver or physical therapist for advice.  ° °Rehabilitation is important following a joint replacement. After just a few days of immobilization, the muscles of the leg can become weakened and shrink (atrophy).  These exercises are designed to build up the tone and strength of the thigh and leg muscles and to improve motion. Often times heat used for twenty to thirty minutes before working out will loosen up your tissues and help with improving the range of motion but do not use heat for the first two weeks following surgery (sometimes heat can increase post-operative swelling).  ° °These exercises can be done on a training (exercise) mat, on the floor, on a table or on a bed. Use whatever works the best and is most comfortable for you.    Use music or television while you are exercising so that the exercises are a pleasant break in your day. This will make your life better with the exercises acting as a break   in your routine that you can look forward to.   Perform all exercises about fifteen times, three times per day or as directed.  You should exercise both the operative leg and the other leg as well. ° °Exercises include: °  °• Quad Sets - Tighten up the muscle on the front of the thigh (Quad) and hold for 5-10 seconds.   °• Straight Leg Raises - With your knee straight (if you were given a brace, keep it on), lift the leg to 60 degrees, hold for 3 seconds, and slowly lower the leg.  Perform this exercise against  resistance later as your leg gets stronger.  °• Leg Slides: Lying on your back, slowly slide your foot toward your buttocks, bending your knee up off the floor (only go as far as is comfortable). Then slowly slide your foot back down until your leg is flat on the floor again.  °• Angel Wings: Lying on your back spread your legs to the side as far apart as you can without causing discomfort.  °• Hamstring Strength:  Lying on your back, push your heel against the floor with your leg straight by tightening up the muscles of your buttocks.  Repeat, but this time bend your knee to a comfortable angle, and push your heel against the floor.  You may put a pillow under the heel to make it more comfortable if necessary.  ° °A rehabilitation program following joint replacement surgery can speed recovery and prevent re-injury in the future due to weakened muscles. Contact your doctor or a physical therapist for more information on knee rehabilitation.  ° ° °CONSTIPATION ° °Constipation is defined medically as fewer than three stools per week and severe constipation as less than one stool per week.  Even if you have a regular bowel pattern at home, your normal regimen is likely to be disrupted due to multiple reasons following surgery.  Combination of anesthesia, postoperative narcotics, change in appetite and fluid intake all can affect your bowels.  ° °YOU MUST use at least one of the following options; they are listed in order of increasing strength to get the job done.  They are all available over the counter, and you may need to use some, POSSIBLY even all of these options:   ° °Drink plenty of fluids (prune juice may be helpful) and high fiber foods °Colace 100 mg by mouth twice a day  °Senokot for constipation as directed and as needed Dulcolax (bisacodyl), take with full glass of water  °Miralax (polyethylene glycol) once or twice a day as needed. ° °If you have tried all these things and are unable to have a bowel  movement in the first 3-4 days after surgery call either your surgeon or your primary doctor.   ° °If you experience loose stools or diarrhea, hold the medications until you stool forms back up.  If your symptoms do not get better within 1 week or if they get worse, check with your doctor.  If you experience "the worst abdominal pain ever" or develop nausea or vomiting, please contact the office immediately for further recommendations for treatment. ° ° °ITCHING:  If you experience itching with your medications, try taking only a single pain pill, or even half a pain pill at a time.  You can also use Benadryl over the counter for itching or also to help with sleep.  ° °TED HOSE STOCKINGS:  Use stockings on both legs until for at least 2 weeks or as   directed by physician office. They may be removed at night for sleeping.  MEDICATIONS:  See your medication summary on the After Visit Summary that nursing will review with you.  You may have some home medications which will be placed on hold until you complete the course of blood thinner medication.  It is important for you to complete the blood thinner medication as prescribed.  PRECAUTIONS:  If you experience chest pain or shortness of breath - call 911 immediately for transfer to the hospital emergency department.   If you develop a fever greater that 101 F, purulent drainage from wound, increased redness or drainage from wound, foul odor from the wound/dressing, or calf pain - CONTACT YOUR SURGEON.                                                   FOLLOW-UP APPOINTMENTS:  If you do not already have a post-op appointment, please call the office for an appointment to be seen by your surgeon.  Guidelines for how soon to be seen are listed in your After Visit Summary, but are typically between 1-4 weeks after surgery.  OTHER INSTRUCTIONS:   Knee Replacement:  Do not place pillow under knee, focus on keeping the knee straight while resting. CPM  instructions: 0-90 degrees, 2 hours in the morning, 2 hours in the afternoon, and 2 hours in the evening. Place foam block, curve side up under heel at all times except when in CPM or when walking.  DO NOT modify, tear, cut, or change the foam block in any way.  MAKE SURE YOU:   Understand these instructions.   Get help right away if you are not doing well or get worse.    Thank you for letting us be a part of your medical care team.  It is a privilege we respect greatly.  We hope these instructions will help you stay on track for a fast and full recovery!   Information on my medicine - ELIQUIS (apixaban)  This medication education was reviewed with me or my healthcare representative as part of my discharge preparation.  Why was Eliquis prescribed for you? Eliquis was prescribed for you to reduce the risk of a blood clot forming that can cause a stroke if you have a medical condition called atrial fibrillation (a type of irregular heartbeat).  What do You need to know about Eliquis ? Take your Eliquis TWICE DAILY - one tablet in the morning and one tablet in the evening with or without food. If you have difficulty swallowing the tablet whole please discuss with your pharmacist how to take the medication safely.  Take Eliquis exactly as prescribed by your doctor and DO NOT stop taking Eliquis without talking to the doctor who prescribed the medication.  Stopping may increase your risk of developing a stroke.  Refill your prescription before you run out.  After discharge, you should have regular check-up appointments with your healthcare provider that is prescribing your Eliquis.  In the future your dose may need to be changed if your kidney function or weight changes by a significant amount or as you get older.  What do you do if you miss a dose? If you miss a dose, take it as soon as you remember on the same day and resume taking twice daily.  Do not take more  than one dose of  ELIQUIS at the same time to make up a missed dose.  Important Safety Information A possible side effect of Eliquis is bleeding. You should call your healthcare provider right away if you experience any of the following: ? Bleeding from an injury or your nose that does not stop. ? Unusual colored urine (red or dark brown) or unusual colored stools (red or black). ? Unusual bruising for unknown reasons. ? A serious fall or if you hit your head (even if there is no bleeding).  Some medicines may interact with Eliquis and might increase your risk of bleeding or clotting while on Eliquis. To help avoid this, consult your healthcare provider or pharmacist prior to using any new prescription or non-prescription medications, including herbals, vitamins, non-steroidal anti-inflammatory drugs (NSAIDs) and supplements.  This website has more information on Eliquis (apixaban): http://www.eliquis.com/eliquis/home

## 2016-11-23 ENCOUNTER — Encounter (HOSPITAL_COMMUNITY): Payer: Self-pay

## 2016-11-23 LAB — BASIC METABOLIC PANEL
Anion gap: 7 (ref 5–15)
BUN: 28 mg/dL — ABNORMAL HIGH (ref 6–20)
CHLORIDE: 101 mmol/L (ref 101–111)
CO2: 27 mmol/L (ref 22–32)
CREATININE: 1.13 mg/dL — AB (ref 0.44–1.00)
Calcium: 8 mg/dL — ABNORMAL LOW (ref 8.9–10.3)
GFR calc Af Amer: 51 mL/min — ABNORMAL LOW (ref >60)
GFR calc non Af Amer: 44 mL/min — ABNORMAL LOW (ref >60)
Glucose, Bld: 105 mg/dL — ABNORMAL HIGH (ref 65–99)
Potassium: 4.3 mmol/L (ref 3.5–5.1)
Sodium: 135 mmol/L (ref 135–145)

## 2016-11-23 LAB — CBC WITH DIFFERENTIAL/PLATELET
Basophils Absolute: 0 10*3/uL (ref 0.0–0.1)
Basophils Relative: 0 %
EOS ABS: 0 10*3/uL (ref 0.0–0.7)
EOS PCT: 0 %
HCT: 24.9 % — ABNORMAL LOW (ref 36.0–46.0)
HEMOGLOBIN: 8.1 g/dL — AB (ref 12.0–15.0)
LYMPHS ABS: 2.4 10*3/uL (ref 0.7–4.0)
Lymphocytes Relative: 25 %
MCH: 29.2 pg (ref 26.0–34.0)
MCHC: 32.5 g/dL (ref 30.0–36.0)
MCV: 89.9 fL (ref 78.0–100.0)
MONOS PCT: 11 %
Monocytes Absolute: 1.1 10*3/uL — ABNORMAL HIGH (ref 0.1–1.0)
Neutro Abs: 6.2 10*3/uL (ref 1.7–7.7)
Neutrophils Relative %: 64 %
PLATELETS: 132 10*3/uL — AB (ref 150–400)
RBC: 2.77 MIL/uL — ABNORMAL LOW (ref 3.87–5.11)
RDW: 15.7 % — ABNORMAL HIGH (ref 11.5–15.5)
WBC: 9.7 10*3/uL (ref 4.0–10.5)

## 2016-11-23 LAB — CBC
HEMATOCRIT: 24.4 % — AB (ref 36.0–46.0)
HEMOGLOBIN: 7.9 g/dL — AB (ref 12.0–15.0)
MCH: 29 pg (ref 26.0–34.0)
MCHC: 32.4 g/dL (ref 30.0–36.0)
MCV: 89.7 fL (ref 78.0–100.0)
Platelets: 130 10*3/uL — ABNORMAL LOW (ref 150–400)
RBC: 2.72 MIL/uL — ABNORMAL LOW (ref 3.87–5.11)
RDW: 15.7 % — ABNORMAL HIGH (ref 11.5–15.5)
WBC: 10.9 10*3/uL — ABNORMAL HIGH (ref 4.0–10.5)

## 2016-11-23 MED ORDER — POLYETHYLENE GLYCOL 3350 17 G PO PACK
17.0000 g | PACK | Freq: Every day | ORAL | Status: DC
  Administered 2016-11-23 – 2016-11-25 (×3): 17 g via ORAL
  Filled 2016-11-23 (×3): qty 1

## 2016-11-23 MED ORDER — APIXABAN 2.5 MG PO TABS: 2.5000 mg | ORAL_TABLET | Freq: Two times a day (BID) | ORAL | Status: DC

## 2016-11-23 MED ORDER — OXYCODONE-ACETAMINOPHEN 5-325 MG PO TABS
1.0000 | ORAL_TABLET | ORAL | Status: DC | PRN
  Administered 2016-11-24: 1 via ORAL
  Administered 2016-11-25: 2 via ORAL
  Filled 2016-11-23: qty 1
  Filled 2016-11-23: qty 2

## 2016-11-23 MED ORDER — SENNOSIDES-DOCUSATE SODIUM 8.6-50 MG PO TABS
1.0000 | ORAL_TABLET | Freq: Two times a day (BID) | ORAL | Status: DC
  Administered 2016-11-23 – 2016-11-25 (×5): 1 via ORAL
  Filled 2016-11-23 (×6): qty 1

## 2016-11-23 MED ORDER — DILTIAZEM HCL ER COATED BEADS 120 MG PO CP24
120.0000 mg | ORAL_CAPSULE | Freq: Every day | ORAL | Status: DC
  Administered 2016-11-24 – 2016-11-25 (×2): 120 mg via ORAL
  Filled 2016-11-23 (×2): qty 1

## 2016-11-23 NOTE — Care Management Note (Signed)
Case Management Note  Patient Details  Name: AYMEE FOMBY MRN: [id number] Date of Birth: 1932-03-14  Subjective/Objective:   Left femur fracture, s/p IM placement on 11/21/2016                 Action/Plan: Discharge Planning:  Chart reviewed. NCM spoke to dtr, [name] and they are requesting SNF. CSW following for SNF placement.   PCP Biagio Borg MD   Expected Discharge Date:                 Expected Discharge Plan:  [place]  In-House Referral:  Clinical Social Work  Discharge planning Services  CM Consult  Post Acute Care Choice:  NA Choice offered to:  NA  DME Arranged:  N/A DME Agency:  NA  HH Arranged:  NA HH Agency:  NA  Status of Service:  Completed, signed off  If discussed at [place] of Stay Meetings, dates discussed:    Additional Comments:  Erenest Rasher, RN 11/23/2016, 3:01 PM

## 2016-11-23 NOTE — Progress Notes (Signed)
[facility] Progress Note  Patient: Kayla Kent JGO:[id number]   PCP: Cathlean Cower, MD DOB: May 03, 1932   DOA: 11/20/2016   DOS: 11/23/2016   Date of Service: the patient was seen and examined on 11/23/2016   Subjective: Feeling better, nor did complain of than fatigue and tiredness. No nausea no vomiting. Has some hematoma development at the surgical site.  Brief hospital course: Pt. with PMH of A. fib, HTN, seizures, HLD, chronic anticoagulation with Eliquis; admitted on 11/20/2016, with complaint of a mechanical fall after tripping over her dog, was found to have left intratrochanteric hip fracture. Currently further plan is to monitor postoperative course.  Assessment and Plan:  1. left femur fracture  Postoperative acute blood loss anemia. S/P intramedullary nail placement 11/21/2016. Appreciate orthopedic input. We'll monitor for pain management as well as PTOT recommendation. Hemoglobin dropped from 12.5 on admission to 9.1 to 7.9-8.1 today. Transfuse for hemoglobin less than 8. Hold Eliquis for now. Hematoma site has been marked.  2. A. fib, on chronic anticoagulation. Last dose of Eliquis was on 11/20/2016 morning. Currently rate controlled. Still in A. fib. Continue oral Cardizem. Dose reduced from 180-120. Discussed with orthopedics, safe to restart Eliquis, we will hold it given the drop in H&H and monitor overnight. Check CBC in 1 week on discharge  3. Essential hypertension. Continue Cardizem and hold Norvasc.  4. History of seizures. No active seizures reported. Continue Keppra 250 twice a day.  5. Generalized anxiety. On Xanax continue twice a day as needed per  Bowel regimen: last BM 11/19/2016 continue stool softener Diet: cardiac diet as tolerated DVT Prophylaxis: Eliquis  Advance goals of care discussion:  Full code  Family Communication: no family was present at bedside, at the time of interview.  Disposition:  Discharge to SNF, social worker  working on the case Expected discharge date: 11/23/2016,  Consultants: orthopedics Procedures: S/P intramedullary nail placement 11/21/2016.  Antibiotics: Anti-infectives    Start     Dose/Rate Route Frequency Ordered Stop   11/21/16 1200  clindamycin (CLEOCIN) IVPB 600 mg     600 mg 100 mL/hr over 30 Minutes Intravenous Every 6 hours 11/21/16 1154 11/21/16 1843   11/21/16 0918  clindamycin (CLEOCIN) 900 MG/50ML IVPB    Comments:  Merryl Hacker   : cabinet override      11/21/16 0918 11/21/16 0946   11/21/16 0845  clindamycin (CLEOCIN) IVPB 900 mg     900 mg 100 mL/hr over 30 Minutes Intravenous On call to O.R. 11/21/16 0833 11/21/16 0946     Objective: Physical Exam: Vitals:   11/23/16 0603 11/23/16 0800 11/23/16 1040 11/23/16 1331  BP: (!) 115/48 (!) 139/43 (!) 129/43 (!) 117/45  Pulse: 98 94  82  Resp: '18 18  18  '$ Temp: 97.7 F (36.5 C) 98.7 F (37.1 C)  98.8 F (37.1 C)  TempSrc: Oral Oral  Oral  SpO2: 95% 93%  93%  Weight:      Height:        Intake/Output Summary (Last 24 hours) at 11/23/16 1426 Last data filed at 11/22/16 2003  Gross per 24 hour  Intake                0 ml  Output              200 ml  Net             -200 ml   Filed Weights   11/20/16 2238 11/22/16 0523  Weight: 50.3  kg (110 lb 12.8 oz) 53.1 kg (117 lb)    General: Alert, Awake and Oriented to Time, Place and Person. Appear in mild distress, affect appropriate Eyes: PERRL, Conjunctiva normal ENT: Oral Mucosa clear moist Neck: no JVD, no Abnormal Mass Or lumps Cardiovascular: S1 and S2 Present, no Murmur, Respiratory: Bilateral Air entry equal and Decreased, no use of accessory muscle, Clear to Auscultation, no Crackles, no wheezes Abdomen: Bowel Sound present, Soft and no tenderness Skin: no redness, n Rash, no induration Extremities: no Pedal edema, no calf tenderness, small hematoma developed at the surgical site on the left hip Neurologic: Grossly no focal neuro deficit.  Bilaterally Equal motor strength  Data Reviewed: CBC:  Recent Labs Lab 11/20/16 1526 11/21/16 0118 11/22/16 0234 11/23/16 0222 11/23/16 0825  WBC 11.4* 9.4 11.2* 10.9* 9.7  NEUTROABS 9.3*  --   --   --  6.2  HGB 12.5 11.8* 9.1* 7.9* 8.1*  HCT 38.8 37.1 28.2* 24.4* 24.9*  MCV 88.6 89.4 89.8 89.7 89.9  PLT 176 161 144* 130* 973*   Basic Metabolic Panel:  Recent Labs Lab 11/20/16 1526 11/21/16 0118 11/22/16 0234 11/23/16 0222  NA 139 138 136 135  K 4.8 4.3 4.9 4.3  CL 106 105 102 101  CO2 '24 24 25 27  '$ GLUCOSE 221* 144* 110* 105*  BUN '20 12 20 '$ 28*  CREATININE 1.09* 0.81 1.35* 1.13*  CALCIUM 8.7* 8.5* 8.2* 8.0*    Liver Function Tests: No results for input(s): AST, ALT, ALKPHOS, BILITOT, PROT, ALBUMIN in the last 168 hours. No results for input(s): LIPASE, AMYLASE in the last 168 hours. No results for input(s): AMMONIA in the last 168 hours. Coagulation Profile:  Recent Labs Lab 11/20/16 1526  INR 1.16   Cardiac Enzymes: No results for input(s): CKTOTAL, CKMB, CKMBINDEX, TROPONINI in the last 168 hours. BNP (last 3 results)  Recent Labs  10/22/16 1135  PROBNP 410.0*    CBG: No results for input(s): GLUCAP in the last 168 hours.  Studies: No results found.   Scheduled Meds: . atorvastatin  10 mg Oral q1800  . [START ON 11/24/2016] diltiazem  120 mg Oral Daily  . levETIRAcetam  250 mg Oral BID  . methocarbamol  500 mg Oral Q8H  . polyethylene glycol  17 g Oral Daily  . senna-docusate  1 tablet Oral BID  . sodium chloride flush  3 mL Intravenous Q12H   Continuous Infusions:  PRN Meds: acetaminophen **OR** acetaminophen, ALPRAZolam, menthol-cetylpyridinium **OR** phenol, metoCLOPramide **OR** metoCLOPramide (REGLAN) injection, morphine injection, ondansetron **OR** ondansetron (ZOFRAN) IV, oxyCODONE-acetaminophen, traMADol  Time spent: 30 minutes  Author: Berle Mull, MD [facility] Pager: [phone] 11/23/2016 2:26 PM  If 7PM-7AM,  please contact night-coverage at [email or web address], password [facility]

## 2016-11-23 NOTE — Progress Notes (Signed)
Physical Therapy Treatment Patient Details Name: Kayla Kent MRN: [id number] DOB: 05/10/1932 Today's Date: 11/23/2016    History of Present Illness 83 y.o. female with closed femur fracture s/p im intertrochanter nail on 2/4. PMH: HLD, afib, seizures.     PT Comments    Pt progressing slowly towards goals. Pt able to tolerate ambulation this session but requires max A due to limited weightbearing tolerance on L LE. Acute PT will continue to follow.    Follow Up Recommendations  SNF     Equipment Recommendations  None recommended by PT    Recommendations for Other Services       Precautions / Restrictions Precautions Precautions: Fall Precaution Comments: pt has history of falls Restrictions Weight Bearing Restrictions: Yes LLE Weight Bearing: Weight bearing as tolerated    Mobility  Bed Mobility Overal bed mobility: Needs Assistance Bed Mobility: Supine to Sit     Supine to sit: Mod assist     General bed mobility comments: HOB elevated, mod A for L LE EOB, min A for trunk with v/c to push through arms  Transfers Overall transfer level: Needs assistance Equipment used: Rolling walker (2 wheeled) Transfers: Sit to/from Stand Sit to Stand: Mod assist;+2 physical assistance;+2 safety/equipment         General transfer comment: pt requires modA x 2 for power up and v/c for transition to RW and upright posture  Ambulation/Gait Ambulation/Gait assistance: Max assist;+2 safety/equipment Ambulation Distance (Feet): 10 Feet Assistive device: Rolling walker (2 wheeled) Gait Pattern/deviations: Step-to pattern;Decreased stride length;Decreased stance time - left;Decreased step length - right;Decreased step length - left;Decreased weight shift to left;Antalgic;Trunk flexed;Drifts right/left;Wide base of support Gait velocity: decreased Gait velocity interpretation: Below normal speed for age/gender General Gait Details: max A x 1 for L weight shift, v/c for upright  posture, L weight shift and proper RW use, prior to ambulation weight shifting and marching in standing   Stairs            Wheelchair Mobility    Modified Rankin (Stroke Patients Only)       Balance Overall balance assessment: Needs assistance;History of Falls Sitting-balance support: Feet supported;Bilateral upper extremity supported Sitting balance-Leahy Scale: Poor Sitting balance - Comments: pt requires B UE assist for weight shift in seated Postural control: Right lateral lean Standing balance support: Bilateral upper extremity supported Standing balance-Leahy Scale: Zero Standing balance comment: pt requires maxA x 1 to maintain balance due to R lateral lean, v/c for upright posture and proper use of RW                    Cognition Arousal/Alertness: Awake/alert Behavior During Therapy: Anxious Overall Cognitive Status: Within Functional Limits for tasks assessed                 General Comments: pt anxious about walking/falling    Exercises General Exercises - Lower Extremity Quad Sets: AROM;Strengthening;Left;10 reps;Supine Long Arc Quad: AROM;Strengthening;10 reps;Left;Seated Hip Flexion/Marching: AROM;Strengthening;Left;10 reps;Seated;Standing    General Comments        Pertinent Vitals/Pain Pain Assessment: 0-10 Pain Score: 6  Pain Location: L hip Pain Descriptors / Indicators: Constant;Discomfort Pain Intervention(s): Limited activity within patient's tolerance;Monitored during session;Repositioned    Home Living                      Prior Function            PT Goals (current goals can now be found in the  care plan section) Progress towards PT goals: Progressing toward goals    Frequency    Min 5X/week      PT Plan Current plan remains appropriate    Co-evaluation             End of Session Equipment Utilized During Treatment: Gait belt Activity Tolerance: Patient limited by pain Patient left: in  chair;with call bell/phone within reach     Time: 0914-0943 PT Time Calculation (min) (ACUTE ONLY): 29 min  Charges:  $Gait Training: 8-22 mins $Therapeutic Exercise: 8-22 mins                    G Codes:      Tracie Harrier 12-10-2016, 11:58 AM  Tracie Harrier, SPT Acute Rehab SPT [phone]

## 2016-11-24 DIAGNOSIS — D62 Acute posthemorrhagic anemia: Secondary | ICD-10-CM

## 2016-11-24 DIAGNOSIS — E785 Hyperlipidemia, unspecified: Secondary | ICD-10-CM

## 2016-11-24 DIAGNOSIS — I481 Persistent atrial fibrillation: Secondary | ICD-10-CM

## 2016-11-24 DIAGNOSIS — R569 Unspecified convulsions: Secondary | ICD-10-CM

## 2016-11-24 DIAGNOSIS — S72142D Displaced intertrochanteric fracture of left femur, subsequent encounter for closed fracture with routine healing: Secondary | ICD-10-CM

## 2016-11-24 DIAGNOSIS — T148XXA Other injury of unspecified body region, initial encounter: Secondary | ICD-10-CM

## 2016-11-24 DIAGNOSIS — F419 Anxiety disorder, unspecified: Secondary | ICD-10-CM

## 2016-11-24 DIAGNOSIS — S72002S Fracture of unspecified part of neck of left femur, sequela: Secondary | ICD-10-CM

## 2016-11-24 DIAGNOSIS — I10 Essential (primary) hypertension: Secondary | ICD-10-CM

## 2016-11-24 LAB — CBC
HCT: 22.9 % — ABNORMAL LOW (ref 36.0–46.0)
Hemoglobin: 7.4 g/dL — ABNORMAL LOW (ref 12.0–15.0)
MCH: 29 pg (ref 26.0–34.0)
MCHC: 32.3 g/dL (ref 30.0–36.0)
MCV: 89.8 fL (ref 78.0–100.0)
Platelets: 151 10*3/uL (ref 150–400)
RBC: 2.55 MIL/uL — ABNORMAL LOW (ref 3.87–5.11)
RDW: 15.6 % — AB (ref 11.5–15.5)
WBC: 10 10*3/uL (ref 4.0–10.5)

## 2016-11-24 LAB — BASIC METABOLIC PANEL
Anion gap: 8 (ref 5–15)
BUN: 21 mg/dL — ABNORMAL HIGH (ref 6–20)
CALCIUM: 7.8 mg/dL — AB (ref 8.9–10.3)
CO2: 26 mmol/L (ref 22–32)
Chloride: 102 mmol/L (ref 101–111)
Creatinine, Ser: 1.05 mg/dL — ABNORMAL HIGH (ref 0.44–1.00)
GFR calc non Af Amer: 48 mL/min — ABNORMAL LOW (ref >60)
GFR, EST AFRICAN AMERICAN: 56 mL/min — AB (ref >60)
Glucose, Bld: 98 mg/dL (ref 65–99)
Potassium: 4.3 mmol/L (ref 3.5–5.1)
SODIUM: 136 mmol/L (ref 135–145)

## 2016-11-24 LAB — PREPARE RBC (CROSSMATCH)

## 2016-11-24 MED ORDER — SODIUM CHLORIDE 0.9 % IV SOLN
Freq: Once | INTRAVENOUS | Status: AC
  Administered 2016-11-24: 13:00:00 via INTRAVENOUS

## 2016-11-24 NOTE — Progress Notes (Signed)
Progress Note    AVERLY Kent  ZOX:[id number] DOB: 03-Sep-1932  DOA: 11/20/2016 PCP: Cathlean Cower, MD    Brief Narrative:   Chief complaint: F/U left hip fracture  Kayla Kent is an 83 y.o. female with medical history significant of A Fib on Eliquis, HLD, HTN, seizure disorder, who was admitted 11/20/16 after suffering from a mechanical fall resulting in a left hip fracture.  She underwent intramedullary nailing on 11/21/16 by Dr. Sharol Given with post operative course complicated by acute blood loss anemia secondary to a left thigh hematoma.  Assessment/Plan:   Principal Problem:   Closed femur fracture ([place]), left, complicated by acute blood loss anemia secondary to hematoma S/P intramedullary nail 11/21/16.  Postoperative course complicated by acute blood loss anemia due to a large left thigh hematoma.  Will give 1 unit of PRBCs today.  Dr. Sharol Given called to re-evaluate operative site. Continue Ultram and oxycodone for pain control. Eliquis remains on hold. Continue PT/OT.  Active Problems:   Stage III CKD GFR consistent with stage II-III CKD. Monitor creatinine.    Atrial fibrillation on chronic anticoagulation Eliquis on hold.  Continue Cardizem at reduced dose.  Rate controlled.    HLD (hyperlipidemia) Continue Lipitor.    Essential hypertension Continue Cardizem.  Norvasc on hold.    Seizures ([place]) Continue Keppra.    Generalized anxiety Continue PRN Xanax.   Family Communication/Anticipated D/C date and plan/Code Status   DVT prophylaxis: SCDs ordered. Code Status: Full Code.  Family Communication: Daughter updated at the bedside. Disposition Plan: SNF when hemoglobin stable, 24-48 hours.   Medical Consultants:    Orthopedic Surgery: Newt Minion, MD   Procedures:    Intramedullary nailing left intertrochanteric hip fracture 11/21/16  Anti-Infectives:    None  Subjective:   Kayla Kent is sitting up in the chair and reports severe left hip pain, rated  6/10.  Appetite is fair.  No nausea or vomiting. Feels weak and has severe pain with moving her left leg.  Objective:    Vitals:   11/23/16 1040 11/23/16 1331 11/23/16 2045 11/24/16 0556  BP: (!) 129/43 (!) 117/45 (!) 136/42 (!) 122/46  Pulse:  82 84 78  Resp:  '18 18 18  '$ Temp:  98.8 F (37.1 C) 99.2 F (37.3 C) 99.1 F (37.3 C)  TempSrc:  Oral Oral Oral  SpO2:  93% 95% 95%  Weight:      Height:        Intake/Output Summary (Last 24 hours) at 11/24/16 0730 Last data filed at 11/23/16 2200  Gross per 24 hour  Intake              480 ml  Output               50 ml  Net              430 ml   Filed Weights   11/20/16 2238 11/22/16 0523  Weight: 50.3 kg (110 lb 12.8 oz) 53.1 kg (117 lb)    Exam: General exam: Appears uncomfortable. Sitting up in chair. Respiratory system: Clear to auscultation. Respiratory effort normal. Cardiovascular system: HSIR. No JVD,  rubs, gallops or clicks. No murmurs. Gastrointestinal system: Abdomen is nondistended, soft and nontender. No organomegaly or masses felt. Normal bowel sounds heard. Central nervous system: Alert and oriented. No focal neurological deficits. Disconjugate gaze from "lazy" eye. Extremities: Left thigh markedly swollen compared to right, but soft with faint bruising around staple line.  Skin: Bruising at operative site, left thigh. Psychiatry: Judgement and insight appear diminished. Mood & affect flat.   Data Reviewed:   I have personally reviewed following labs and imaging studies:  Labs: Basic Metabolic Panel:  Recent Labs Lab 11/20/16 1526 11/21/16 0118 11/22/16 0234 11/23/16 0222 11/24/16 0200  NA 139 138 136 135 136  K 4.8 4.3 4.9 4.3 4.3  CL 106 105 102 101 102  CO2 '24 24 25 27 26  '$ GLUCOSE 221* 144* 110* 105* 98  BUN '20 12 20 '$ 28* 21*  CREATININE 1.09* 0.81 1.35* 1.13* 1.05*  CALCIUM 8.7* 8.5* 8.2* 8.0* 7.8*   GFR Estimated Creatinine Clearance: 34.6 mL/min (by C-G formula based on SCr of 1.05 mg/dL  (H)). Liver Function Tests: No results for input(s): AST, ALT, ALKPHOS, BILITOT, PROT, ALBUMIN in the last 168 hours. No results for input(s): LIPASE, AMYLASE in the last 168 hours. No results for input(s): AMMONIA in the last 168 hours. Coagulation profile  Recent Labs Lab 11/20/16 1526  INR 1.16    CBC:  Recent Labs Lab 11/20/16 1526 11/21/16 0118 11/22/16 0234 11/23/16 0222 11/23/16 0825  WBC 11.4* 9.4 11.2* 10.9* 9.7  NEUTROABS 9.3*  --   --   --  6.2  HGB 12.5 11.8* 9.1* 7.9* 8.1*  HCT 38.8 37.1 28.2* 24.4* 24.9*  MCV 88.6 89.4 89.8 89.7 89.9  PLT 176 161 144* 130* 132*   Cardiac Enzymes: No results for input(s): CKTOTAL, CKMB, CKMBINDEX, TROPONINI in the last 168 hours. BNP (last 3 results)  Recent Labs  10/22/16 1135  PROBNP 410.0*   CBG: No results for input(s): GLUCAP in the last 168 hours. D-Dimer: No results for input(s): DDIMER in the last 72 hours. Hgb A1c: No results for input(s): HGBA1C in the last 72 hours. Lipid Profile: No results for input(s): CHOL, HDL, LDLCALC, TRIG, CHOLHDL, LDLDIRECT in the last 72 hours. Thyroid function studies: No results for input(s): TSH, T4TOTAL, T3FREE, THYROIDAB in the last 72 hours.  Invalid input(s): FREET3 Anemia work up: No results for input(s): VITAMINB12, FOLATE, FERRITIN, TIBC, IRON, RETICCTPCT in the last 72 hours. Sepsis Labs:  Recent Labs Lab 11/21/16 0118 11/22/16 0234 11/23/16 0222 11/23/16 0825  WBC 9.4 11.2* 10.9* 9.7    Microbiology Recent Results (from the past 240 hour(s))  Surgical pcr screen     Status: None   Collection Time: 11/21/16  8:32 AM  Result Value Ref Range Status   MRSA, PCR NEGATIVE NEGATIVE Final   Staphylococcus aureus NEGATIVE NEGATIVE Final    Comment:        The Xpert SA Assay (FDA approved for NASAL specimens in patients over 52 years of age), is one component of a comprehensive surveillance program.  Test performance has been validated by [facility] for  patients greater than or equal to 33 year old. It is not intended to diagnose infection nor to guide or monitor treatment.     Radiology: No results found.  Medications:   . atorvastatin  10 mg Oral q1800  . diltiazem  120 mg Oral Daily  . levETIRAcetam  250 mg Oral BID  . methocarbamol  500 mg Oral Q8H  . polyethylene glycol  17 g Oral Daily  . senna-docusate  1 tablet Oral BID  . sodium chloride flush  3 mL Intravenous Q12H   Continuous Infusions:  Medical decision making is of high complexity and this patient is at high risk of deterioration, therefore this is a level 3 visit.  (>  4 problem points, 2 data points, high risk)   LOS: 4 days   Kayla Kent  [facility] Pager [phone]. If unable to reach me by pager, please call my cell phone at [phone].  *Please refer to [email or web address], password [email or web address] to get updated schedule on who will round on this patient, as hospitalists switch teams weekly. If 7PM-7AM, please contact night-coverage at [email or web address], password [email or web address] for any overnight needs.  11/24/2016, 7:30 AM

## 2016-11-24 NOTE — Progress Notes (Signed)
PT Cancellation Note  Patient Details Name: Kayla Kent MRN: [id number] DOB: 17-Dec-1931   Cancelled Treatment:    Reason Eval/Treat Not Completed: Medical issues which prohibited therapy. Pt receiving blood transfusion. Pt with noticeable confusion. PT will resume when able.    Doroteo Bradford Neyla Gauntt 11/24/2016, 2:48 PM  Tracie Harrier, SPT Acute Rehab SPT [phone]

## 2016-11-24 NOTE — Progress Notes (Signed)
CSW spoke to patients daughter via phone in regards  to SNF placement. Daughter [name] stated that she had looked over the list given to her of the facility with bed offers and stated that the family has decided on [facility]. CSW will contact facility and make them aware of family's decision.   Rhea Pink, MSW,  [place]

## 2016-11-24 NOTE — Care Management Important Message (Signed)
Important Message  Patient Details  Name: Kayla Kent MRN: [id number] Date of Birth: 1932-01-28   Medicare Important Message Given:  Yes    Nathen May 11/24/2016, 11:39 AM

## 2016-11-25 ENCOUNTER — Encounter: Payer: Self-pay | Admitting: Geriatric Medicine

## 2016-11-25 LAB — TYPE AND SCREEN
ABO/RH(D): B POS
Antibody Screen: NEGATIVE
Unit division: 0

## 2016-11-25 LAB — CBC
HCT: 27.3 % — ABNORMAL LOW (ref 36.0–46.0)
Hemoglobin: 8.9 g/dL — ABNORMAL LOW (ref 12.0–15.0)
MCH: 29.2 pg (ref 26.0–34.0)
MCHC: 32.6 g/dL (ref 30.0–36.0)
MCV: 89.5 fL (ref 78.0–100.0)
PLATELETS: 197 10*3/uL (ref 150–400)
RBC: 3.05 MIL/uL — ABNORMAL LOW (ref 3.87–5.11)
RDW: 15.4 % (ref 11.5–15.5)
WBC: 8.7 10*3/uL (ref 4.0–10.5)

## 2016-11-25 LAB — BASIC METABOLIC PANEL
Anion gap: 8 (ref 5–15)
BUN: 23 mg/dL — AB (ref 6–20)
CALCIUM: 8.2 mg/dL — AB (ref 8.9–10.3)
CO2: 26 mmol/L (ref 22–32)
CREATININE: 0.89 mg/dL (ref 0.44–1.00)
Chloride: 102 mmol/L (ref 101–111)
GFR calc Af Amer: >60 mL/min (ref >60)
GFR, EST NON AFRICAN AMERICAN: 59 mL/min — AB (ref >60)
GLUCOSE: 96 mg/dL (ref 65–99)
POTASSIUM: 4.1 mmol/L (ref 3.5–5.1)
Sodium: 136 mmol/L (ref 135–145)

## 2016-11-25 MED ORDER — ZOLPIDEM TARTRATE 5 MG PO TABS: 5.0000 mg | ORAL_TABLET | Freq: Every evening | ORAL | 0 refills | Status: DC | PRN

## 2016-11-25 MED ORDER — TRAMADOL HCL 50 MG PO TABS: 50.0000 mg | ORAL_TABLET | Freq: Four times a day (QID) | ORAL | 0 refills | Status: DC | PRN

## 2016-11-25 MED ORDER — ALPRAZOLAM 0.25 MG PO TABS: 0.2500 mg | ORAL_TABLET | Freq: Two times a day (BID) | ORAL | 0 refills | Status: DC | PRN

## 2016-11-25 MED ORDER — SENNOSIDES-DOCUSATE SODIUM 8.6-50 MG PO TABS: 1.0000 | ORAL_TABLET | Freq: Two times a day (BID) | ORAL | Status: DC

## 2016-11-25 MED ORDER — POLYETHYLENE GLYCOL 3350 17 G PO PACK: 17.0000 g | PACK | Freq: Every day | ORAL | 0 refills | Status: DC

## 2016-11-25 NOTE — Care Management Note (Signed)
Case Management Note Previous CM note initiated by Erenest Rasher, RN-11/23/2016, 3:01 PM   Patient Details  Name: Kayla Kent MRN: [id number] Date of Birth: 05/10/1932  Subjective/Objective:   Left femur fracture, s/p IM placement on 11/21/2016                 Action/Plan: Discharge Planning:  Chart reviewed. NCM spoke to dtr, [name] and they are requesting SNF. CSW following for SNF placement.   PCP Biagio Borg MD   Expected Discharge Date:  11/25/16               Expected Discharge Plan:  [place]  In-House Referral:  Clinical Social Work  Discharge planning Services  CM Consult  Post Acute Care Choice:  NA Choice offered to:  NA  DME Arranged:  N/A DME Agency:  NA  HH Arranged:  NA HH Agency:  NA  Status of Service:  Completed, signed off  If discussed at [place] of Stay Meetings, dates discussed:    Discharge Disposition: skilled facility   Additional Comments:  11/25/16- 1400- Monay Houlton RN, CM- pt for d/c to SNF today- per CSW pt wants [facility] for placement needs  Dawayne Patricia, RN 11/25/2016, 2:54 PM [phone]

## 2016-11-25 NOTE — Progress Notes (Signed)
Patient in a stable condition, discharge education completed with patient , she verbalised understanding, iv removed , tele dc ccmd notified, patient belongings at bedside,

## 2016-11-25 NOTE — Progress Notes (Signed)
Clinical Social Worker facilitated patient discharge including contacting patient family and facility to confirm patient discharge plans.  Clinical information faxed to facility and family agreeable with plan.  CSW arranged ambulance transport via [facility] to [facility].  RN Kristin Bruins to call [phone] for report prior to discharge.  Clinical Social Worker will sign off for now as social work intervention is no longer needed. Please consult [place] again if new need arises.  Rhea Pink, MSW, [place]

## 2016-11-25 NOTE — Discharge Summary (Addendum)
Physician Discharge Summary  Kayla Kent SPQ:[id number] DOB: 1932/01/31 DOA: 11/20/2016  PCP: Cathlean Cower, MD  Admit date: 11/20/2016 Discharge date: 11/25/2016  Admitted From: Home Discharge disposition: SNF   Recommendations for Outpatient Follow-Up:   1. Recommend repeat CBC 11/27/16 to ensure stability of hemoglobin with resumption of blood thinners.  Discharge Diagnosis:   Principal Problem:    Closed femur fracture ([place]) Active Problems:    HLD (hyperlipidemia)    Anxiety    Essential hypertension    Seizures ([facility])    Hip fracture requiring operative repair ([facility])    Persistent atrial fibrillation ([facility])    Postoperative anemia due to acute blood loss    Hematoma left thigh    Chronic diastolic CHF  Discharge Condition: Improved.  Diet recommendation: Low sodium, heart healthy.    Wound care: Keep incision clean and dry.   History of Present Illness:   Kayla Kent is an 83 y.o. female with medical history significant of A Fib on Eliquis, HLD, HTN, seizure disorder, who was admitted 11/20/16 after suffering from a mechanical fall resulting in a left hip fracture.  She underwent intramedullary nailing on 11/21/16 by Dr. Sharol Given with post operative course complicated by acute blood loss anemia secondary to a left thigh hematoma.  Hospital Course by Problem:   Principal Problem:   Closed femur fracture ([place]), left, complicated by acute blood loss anemia secondary to hematoma S/P intramedullary nail 11/21/16.  Postoperative course complicated by acute blood loss anemia due to a large left thigh hematoma.  Hemoglobin 8.9 after being given 1 unit of packed red blood cells 11/24/16.   Continue Ultram  for pain control. Eliquis remains on hold. OK to resume at SNF. F/U CBC 11/27/16 recommended. Continue PT/OT.  Active Problems:   Stage III CKD GFR consistent with stage II-III CKD. Creatinine improved today.    Atrial fibrillation on chronic anticoagulation/Chronic  diastolic CHF Eliquis on hold, resume upon discharge.  Continue Cardizem at reduced dose.  Rate controlled. Patient has grade 3 diastolic dysfunction per prior echocardiogram, and chronic diastolic CHF, but no evidence of exacerbation while in the hospital.    HLD (hyperlipidemia) Continue Lipitor.    Essential hypertension Continue Cardizem.  Norvasc on hold.    Seizures ([place]) Continue Keppra.    Generalized anxiety Continue PRN Xanax.   Medical Consultants:    Orthopedic Surgery: Newt Minion, MD   Discharge Exam:   Vitals:   11/24/16 2007 11/25/16 0621  BP: (!) 109/57 (!) 139/49  Pulse: 74 67  Resp: 18 18  Temp: 97.8 F (36.6 C) 97.7 F (36.5 C)   Vitals:   11/24/16 1253 11/24/16 1538 11/24/16 2007 11/25/16 0621  BP: (!) 134/41 (!) 141/56 (!) 109/57 (!) 139/49  Pulse: 82 78 74 67  Resp: '18 18 18 18  '$ Temp: 98.3 F (36.8 C) 98.6 F (37 C) 97.8 F (36.6 C) 97.7 F (36.5 C)  TempSrc: Oral Oral Oral Oral  SpO2: 98% 99% 97% 98%  Weight:      Height:       General exam: No distress. Respiratory system: Clear to auscultation. Respiratory effort normal. Cardiovascular system: HSIR. No JVD,  rubs, gallops or clicks. No murmurs. Gastrointestinal system: Abdomen is nondistended, soft and nontender. No organomegaly or masses felt. Normal bowel sounds heard. Central nervous system: Alert and oriented. No focal neurological deficits. Disconjugate gaze from "lazy" eye. Extremities: Left thigh markedly swollen compared to right, but soft with faint bruising around staple  line. Skin: Bruising at operative site, left thigh. Psychiatry: Judgement and insight appear diminished. Mood & affect flat.    The results of significant diagnostics from this hospitalization (including imaging, microbiology, ancillary and laboratory) are listed below for reference.     Procedures and Diagnostic Studies:   Dg Chest 1 View  Result Date: 11/20/2016 CLINICAL DATA:  Fall EXAM:  CHEST 1 VIEW COMPARISON:  09/23/2014 chest radiograph. FINDINGS: Stable cardiomediastinal silhouette with mild cardiomegaly and aortic atherosclerosis. No pneumothorax. No pleural effusion. No overt pulmonary edema. Mild scarring at the left lung base. No acute consolidative airspace disease. No displaced fractures in the chest. IMPRESSION: Stable mild cardiomegaly. No overt pulmonary edema. No acute pulmonary disease. Mild scarring at the left lung base. Aortic atherosclerosis. Electronically Signed   By: Ilona Sorrel M.D.   On: 11/20/2016 16:49   Dg C-arm 1-60 Min  Result Date: 11/21/2016 CLINICAL DATA:  Left-sided intramedullary femoral nail. EXAM: OPERATIVE LEFT HIP (WITH PELVIS IF PERFORMED) 1 VIEW TECHNIQUE: Fluoroscopic spot image(s) were submitted for interpretation post-operatively. COMPARISON:  left hip radiographs - 11/20/2016 FINDINGS: A single spot intraoperative fluoroscopic image of the left hip is provided for review. Provided image demonstrates the sequela of intramedullary rod fixation of the left femur and dynamic screw fixation of the left femoral neck. The distal end of the intramedullary rod is transfixed with a single cancellous screw. Alignment appears improved given solitary AP projection. No radiopaque foreign body. IMPRESSION: Post intramedullary rod fixation of comminuted left intertrochanteric femur fracture. Electronically Signed   By: Sandi Mariscal M.D.   On: 11/21/2016 11:56   Dg Hip Operative Unilat W Or W/o Pelvis Left  Result Date: 11/21/2016 CLINICAL DATA:  Left-sided intramedullary femoral nail. EXAM: OPERATIVE LEFT HIP (WITH PELVIS IF PERFORMED) 1 VIEW TECHNIQUE: Fluoroscopic spot image(s) were submitted for interpretation post-operatively. COMPARISON:  left hip radiographs - 11/20/2016 FINDINGS: A single spot intraoperative fluoroscopic image of the left hip is provided for review. Provided image demonstrates the sequela of intramedullary rod fixation of the left femur and  dynamic screw fixation of the left femoral neck. The distal end of the intramedullary rod is transfixed with a single cancellous screw. Alignment appears improved given solitary AP projection. No radiopaque foreign body. IMPRESSION: Post intramedullary rod fixation of comminuted left intertrochanteric femur fracture. Electronically Signed   By: Sandi Mariscal M.D.   On: 11/21/2016 11:56   Dg Hip Unilat With Pelvis 2-3 Views Left  Result Date: 11/20/2016 CLINICAL DATA:  Left hip pain after fall today EXAM: DG HIP (WITH OR WITHOUT PELVIS) 2-3V LEFT COMPARISON:  None. FINDINGS: Comminuted acute intertrochanteric left proximal femur fracture with mild impaction, noting a butterfly fragment involving the lesser trochanter. No dislocation at the left hip joint. Right hip hemiarthroplasty with no evidence of right hip dislocation on the single frontal pelvic view. No pelvic diastasis. No suspicious focal osseous lesion. IMPRESSION: Acute comminuted intertrochanteric left proximal femur fracture. Electronically Signed   By: Ilona Sorrel M.D.   On: 11/20/2016 16:48     Labs:   Basic Metabolic Panel:  Recent Labs Lab 11/21/16 0118 11/22/16 0234 11/23/16 0222 11/24/16 0200 11/25/16 0225  NA 138 136 135 136 136  K 4.3 4.9 4.3 4.3 4.1  CL 105 102 101 102 102  CO2 '24 25 27 26 26  '$ GLUCOSE 144* 110* 105* 98 96  BUN 12 20 28* 21* 23*  CREATININE 0.81 1.35* 1.13* 1.05* 0.89  CALCIUM 8.5* 8.2* 8.0* 7.8* 8.2*   GFR  Estimated Creatinine Clearance: 40.9 mL/min (by C-G formula based on SCr of 0.89 mg/dL). Liver Function Tests: No results for input(s): AST, ALT, ALKPHOS, BILITOT, PROT, ALBUMIN in the last 168 hours. No results for input(s): LIPASE, AMYLASE in the last 168 hours. No results for input(s): AMMONIA in the last 168 hours. Coagulation profile  Recent Labs Lab 11/20/16 1526  INR 1.16    CBC:  Recent Labs Lab 11/20/16 1526  11/22/16 0234 11/23/16 0222 11/23/16 0825 11/24/16 0200  11/25/16 0225  WBC 11.4*  < > 11.2* 10.9* 9.7 10.0 8.7  NEUTROABS 9.3*  --   --   --  6.2  --   --   HGB 12.5  < > 9.1* 7.9* 8.1* 7.4* 8.9*  HCT 38.8  < > 28.2* 24.4* 24.9* 22.9* 27.3*  MCV 88.6  < > 89.8 89.7 89.9 89.8 89.5  PLT 176  < > 144* 130* 132* 151 197  < > = values in this interval not displayed. Cardiac Enzymes: No results for input(s): CKTOTAL, CKMB, CKMBINDEX, TROPONINI in the last 168 hours. BNP: Invalid input(s): POCBNP CBG: No results for input(s): GLUCAP in the last 168 hours. D-Dimer No results for input(s): DDIMER in the last 72 hours. Hgb A1c No results for input(s): HGBA1C in the last 72 hours. Lipid Profile No results for input(s): CHOL, HDL, LDLCALC, TRIG, CHOLHDL, LDLDIRECT in the last 72 hours. Thyroid function studies No results for input(s): TSH, T4TOTAL, T3FREE, THYROIDAB in the last 72 hours.  Invalid input(s): FREET3 Anemia work up No results for input(s): VITAMINB12, FOLATE, FERRITIN, TIBC, IRON, RETICCTPCT in the last 72 hours. Microbiology Recent Results (from the past 240 hour(s))  Surgical pcr screen     Status: None   Collection Time: 11/21/16  8:32 AM  Result Value Ref Range Status   MRSA, PCR NEGATIVE NEGATIVE Final   Staphylococcus aureus NEGATIVE NEGATIVE Final    Comment:        The Xpert SA Assay (FDA approved for NASAL specimens in patients over 41 years of age), is one component of a comprehensive surveillance program.  Test performance has been validated by [facility] for patients greater than or equal to 25 year old. It is not intended to diagnose infection nor to guide or monitor treatment.      Discharge Instructions:   Discharge Instructions    Call MD for:  extreme fatigue    Complete by:  As directed    Call MD for:  persistant dizziness or light-headedness    Complete by:  As directed    Call MD for:  redness, tenderness, or signs of infection (pain, swelling, redness, odor or green/yellow discharge around  incision site)    Complete by:  As directed    Call MD for:  severe uncontrolled pain    Complete by:  As directed    Call MD for:  temperature >100.4    Complete by:  As directed    Diet - low sodium heart healthy    Complete by:  As directed    Increase activity slowly    Complete by:  As directed    Weight bearing as tolerated    Complete by:  As directed    Laterality:  left   Extremity:  Lower     Allergies as of 11/25/2016      Reactions   Chocolate Hives   Codeine Other (See Comments)   Patient states she acts crazy   Diazepam Other (See Comments)  Patient states she acts crazy.   Fruit & Vegetable Daily [nutritional Supplements] Hives, Swelling   peaches   Iohexol Hives    Code: HIVES, Desc: pt gets 13 hr pre-meds, Onset Date: 78588502   Latex Hives   Peach [prunus Persica] Hives   Peanut-containing Drug Products Hives, Swelling   Penicillins Hives, Itching, Swelling   Has patient had a PCN reaction causing immediate rash, facial/tongue/throat swelling, SOB or lightheadedness with hypotension: Yes Has patient had a PCN reaction causing severe rash involving mucus membranes or skin necrosis: No Has patient had a PCN reaction that required hospitalization No Has patient had a PCN reaction occurring within the last 10 years: No If all of the above answers are "NO", then may proceed with Cephalosporin use.   Strawberry Extract Hives   Sulfonamide Derivatives Hives   nausea   Wheat Shortness Of Breath   Shortness of breath   Wheat Bran Shortness Of Breath   Sulfamethoxazole Hives   Crestor [rosuvastatin Calcium] Rash   Iodine Rash      Medication List    TAKE these medications   acetaminophen 500 MG tablet Commonly known as:  TYLENOL Take 1 tablet (500 mg total) by mouth every 6 (six) hours as needed for mild pain.   ALPRAZolam 0.25 MG tablet Commonly known as:  XANAX Take 1 tablet (0.25 mg total) by mouth 2 (two) times daily as needed for anxiety or sleep.    amLODipine 2.5 MG tablet Commonly known as:  NORVASC Take 1 tablet (2.5 mg total) by mouth daily.   atorvastatin 10 MG tablet Commonly known as:  LIPITOR TAKE ONE TABLET BY MOUTH ONCE DAILY AT 6 PM   diltiazem 180 MG 24 hr capsule Commonly known as:  DILACOR XR TAKE ONE CAPSULE BY MOUTH ONCE DAILY   ELIQUIS 2.5 MG Tabs tablet Generic drug:  apixaban TAKE ONE TABLET BY MOUTH TWICE DAILY   levETIRAcetam 250 MG tablet Commonly known as:  KEPPRA TAKE ONE TABLET BY MOUTH TWICE DAILY   polyethylene glycol packet Commonly known as:  MIRALAX / GLYCOLAX Take 17 g by mouth daily.   senna-docusate 8.6-50 MG tablet Commonly known as:  Senokot-S Take 1 tablet by mouth 2 (two) times daily.   traMADol 50 MG tablet Commonly known as:  ULTRAM Take 1-2 tablets (50-100 mg total) by mouth every 6 (six) hours as needed for moderate pain or severe pain.   zolpidem 5 MG tablet Commonly known as:  AMBIEN Take 1 tablet (5 mg total) by mouth at bedtime as needed. for insomnia What changed:  reasons to take this  additional instructions      Follow-up Information    Newt Minion, MD Follow up in 2 week(s).   Specialty:  Orthopedic Surgery Contact information: [place] [address] [phone]        Cathlean Cower, MD. Schedule an appointment as soon as possible for a visit in 3 week(s).   Specialties:  Internal Medicine, Radiology Why:  Hospital follow up. Contact information: [place] [address] [phone]            Time coordinating discharge: 35 minutes.  Signed:  RAMA,CHRISTINA  Pager [phone] [facility] 11/25/2016, 9:25 AM

## 2016-11-25 NOTE — Progress Notes (Signed)
Patient ID: Kayla Kent, female   DOB: 1931/11/17, 83 y.o.   MRN: [id number] Patient was seen yesterday. Her calf and thigh are soft no signs of DVT no signs of compartment syndrome. She does have a small amount of drainage from the wound and  swelling consistent with a hematoma from her blood thinner. Dressing changed. Patient may continue her physical therapy without restrictions.

## 2016-11-26 DIAGNOSIS — D62 Acute posthemorrhagic anemia: Secondary | ICD-10-CM | POA: Diagnosis not present

## 2016-11-26 DIAGNOSIS — R259 Unspecified abnormal involuntary movements: Secondary | ICD-10-CM | POA: Diagnosis not present

## 2016-11-26 DIAGNOSIS — Y9289 Other specified places as the place of occurrence of the external cause: Secondary | ICD-10-CM | POA: Diagnosis not present

## 2016-11-26 DIAGNOSIS — Y999 Unspecified external cause status: Secondary | ICD-10-CM | POA: Diagnosis not present

## 2016-11-26 DIAGNOSIS — I4891 Unspecified atrial fibrillation: Secondary | ICD-10-CM | POA: Diagnosis not present

## 2016-11-26 DIAGNOSIS — S72142D Displaced intertrochanteric fracture of left femur, subsequent encounter for closed fracture with routine healing: Secondary | ICD-10-CM | POA: Diagnosis not present

## 2016-11-26 DIAGNOSIS — I110 Hypertensive heart disease with heart failure: Secondary | ICD-10-CM | POA: Diagnosis not present

## 2016-11-26 DIAGNOSIS — S72002D Fracture of unspecified part of neck of left femur, subsequent encounter for closed fracture with routine healing: Secondary | ICD-10-CM | POA: Diagnosis not present

## 2016-11-26 DIAGNOSIS — I10 Essential (primary) hypertension: Secondary | ICD-10-CM | POA: Diagnosis not present

## 2016-11-26 DIAGNOSIS — W1839XA Other fall on same level, initial encounter: Secondary | ICD-10-CM | POA: Diagnosis not present

## 2016-11-26 DIAGNOSIS — F419 Anxiety disorder, unspecified: Secondary | ICD-10-CM | POA: Diagnosis not present

## 2016-11-26 DIAGNOSIS — G4700 Insomnia, unspecified: Secondary | ICD-10-CM | POA: Diagnosis not present

## 2016-11-26 DIAGNOSIS — Z87891 Personal history of nicotine dependence: Secondary | ICD-10-CM | POA: Diagnosis not present

## 2016-11-26 DIAGNOSIS — G40909 Epilepsy, unspecified, not intractable, without status epilepticus: Secondary | ICD-10-CM | POA: Diagnosis not present

## 2016-11-26 DIAGNOSIS — E785 Hyperlipidemia, unspecified: Secondary | ICD-10-CM | POA: Diagnosis not present

## 2016-11-26 DIAGNOSIS — R569 Unspecified convulsions: Secondary | ICD-10-CM | POA: Diagnosis not present

## 2016-11-26 DIAGNOSIS — S0990XA Unspecified injury of head, initial encounter: Secondary | ICD-10-CM | POA: Diagnosis present

## 2016-11-26 DIAGNOSIS — Z91010 Allergy to peanuts: Secondary | ICD-10-CM | POA: Diagnosis not present

## 2016-11-26 DIAGNOSIS — Z91040 Latex allergy status: Secondary | ICD-10-CM | POA: Diagnosis not present

## 2016-11-26 DIAGNOSIS — D649 Anemia, unspecified: Secondary | ICD-10-CM | POA: Diagnosis not present

## 2016-11-26 DIAGNOSIS — I5032 Chronic diastolic (congestive) heart failure: Secondary | ICD-10-CM | POA: Diagnosis not present

## 2016-11-26 DIAGNOSIS — Y9389 Activity, other specified: Secondary | ICD-10-CM | POA: Diagnosis not present

## 2016-11-26 DIAGNOSIS — M25552 Pain in left hip: Secondary | ICD-10-CM | POA: Diagnosis not present

## 2016-11-26 DIAGNOSIS — Z9181 History of falling: Secondary | ICD-10-CM | POA: Diagnosis not present

## 2016-11-26 DIAGNOSIS — M79652 Pain in left thigh: Secondary | ICD-10-CM | POA: Diagnosis not present

## 2016-11-26 DIAGNOSIS — R262 Difficulty in walking, not elsewhere classified: Secondary | ICD-10-CM | POA: Diagnosis not present

## 2016-11-26 DIAGNOSIS — Z7901 Long term (current) use of anticoagulants: Secondary | ICD-10-CM | POA: Diagnosis not present

## 2016-11-26 DIAGNOSIS — R52 Pain, unspecified: Secondary | ICD-10-CM | POA: Diagnosis not present

## 2016-11-26 DIAGNOSIS — Z96641 Presence of right artificial hip joint: Secondary | ICD-10-CM | POA: Diagnosis not present

## 2016-11-26 DIAGNOSIS — T148XXA Other injury of unspecified body region, initial encounter: Secondary | ICD-10-CM | POA: Diagnosis not present

## 2016-11-26 DIAGNOSIS — S72002A Fracture of unspecified part of neck of left femur, initial encounter for closed fracture: Secondary | ICD-10-CM | POA: Diagnosis not present

## 2016-11-26 DIAGNOSIS — K5909 Other constipation: Secondary | ICD-10-CM | POA: Diagnosis not present

## 2016-11-26 DIAGNOSIS — R2689 Other abnormalities of gait and mobility: Secondary | ICD-10-CM | POA: Diagnosis not present

## 2016-11-26 DIAGNOSIS — M6281 Muscle weakness (generalized): Secondary | ICD-10-CM | POA: Diagnosis not present

## 2016-11-26 DIAGNOSIS — N183 Chronic kidney disease, stage 3 (moderate): Secondary | ICD-10-CM | POA: Diagnosis not present

## 2016-11-26 DIAGNOSIS — Z8673 Personal history of transient ischemic attack (TIA), and cerebral infarction without residual deficits: Secondary | ICD-10-CM | POA: Diagnosis not present

## 2016-11-26 DIAGNOSIS — R269 Unspecified abnormalities of gait and mobility: Secondary | ICD-10-CM | POA: Diagnosis not present

## 2016-11-26 DIAGNOSIS — S0081XA Abrasion of other part of head, initial encounter: Secondary | ICD-10-CM | POA: Diagnosis not present

## 2016-11-26 DIAGNOSIS — R4182 Altered mental status, unspecified: Secondary | ICD-10-CM | POA: Diagnosis not present

## 2016-11-27 ENCOUNTER — Emergency Department (HOSPITAL_COMMUNITY): Payer: Medicare Other

## 2016-11-27 ENCOUNTER — Emergency Department (HOSPITAL_COMMUNITY)
Admission: EM | Admit: 2016-11-27 | Discharge: 2016-11-27 | Disposition: A | Discharge status: Home / Self Care | Payer: Medicare Other | Attending: Emergency Medicine | Admitting: Emergency Medicine

## 2016-11-27 ENCOUNTER — Encounter (HOSPITAL_COMMUNITY): Payer: Self-pay | Admitting: Emergency Medicine

## 2016-11-27 DIAGNOSIS — Z96641 Presence of right artificial hip joint: Secondary | ICD-10-CM | POA: Insufficient documentation

## 2016-11-27 DIAGNOSIS — W1839XA Other fall on same level, initial encounter: Secondary | ICD-10-CM | POA: Insufficient documentation

## 2016-11-27 DIAGNOSIS — Z7901 Long term (current) use of anticoagulants: Secondary | ICD-10-CM | POA: Insufficient documentation

## 2016-11-27 DIAGNOSIS — M25552 Pain in left hip: Secondary | ICD-10-CM | POA: Diagnosis not present

## 2016-11-27 DIAGNOSIS — S0081XA Abrasion of other part of head, initial encounter: Secondary | ICD-10-CM | POA: Diagnosis not present

## 2016-11-27 DIAGNOSIS — S0990XA Unspecified injury of head, initial encounter: Secondary | ICD-10-CM | POA: Diagnosis not present

## 2016-11-27 DIAGNOSIS — Y9289 Other specified places as the place of occurrence of the external cause: Secondary | ICD-10-CM | POA: Insufficient documentation

## 2016-11-27 DIAGNOSIS — Z91040 Latex allergy status: Secondary | ICD-10-CM | POA: Insufficient documentation

## 2016-11-27 DIAGNOSIS — Z8673 Personal history of transient ischemic attack (TIA), and cerebral infarction without residual deficits: Secondary | ICD-10-CM | POA: Diagnosis not present

## 2016-11-27 DIAGNOSIS — I5032 Chronic diastolic (congestive) heart failure: Secondary | ICD-10-CM | POA: Insufficient documentation

## 2016-11-27 DIAGNOSIS — I110 Hypertensive heart disease with heart failure: Secondary | ICD-10-CM | POA: Diagnosis not present

## 2016-11-27 DIAGNOSIS — Y999 Unspecified external cause status: Secondary | ICD-10-CM | POA: Insufficient documentation

## 2016-11-27 DIAGNOSIS — W19XXXA Unspecified fall, initial encounter: Secondary | ICD-10-CM

## 2016-11-27 DIAGNOSIS — D649 Anemia, unspecified: Secondary | ICD-10-CM | POA: Insufficient documentation

## 2016-11-27 DIAGNOSIS — M79652 Pain in left thigh: Secondary | ICD-10-CM | POA: Diagnosis not present

## 2016-11-27 DIAGNOSIS — Z91010 Allergy to peanuts: Secondary | ICD-10-CM | POA: Insufficient documentation

## 2016-11-27 DIAGNOSIS — Y9389 Activity, other specified: Secondary | ICD-10-CM | POA: Insufficient documentation

## 2016-11-27 DIAGNOSIS — Z87891 Personal history of nicotine dependence: Secondary | ICD-10-CM | POA: Insufficient documentation

## 2016-11-27 LAB — BASIC METABOLIC PANEL
Anion gap: 7 (ref 5–15)
BUN: 23 mg/dL — AB (ref 6–20)
CO2: 27 mmol/L (ref 22–32)
CREATININE: 1.12 mg/dL — AB (ref 0.44–1.00)
Calcium: 8.6 mg/dL — ABNORMAL LOW (ref 8.9–10.3)
Chloride: 104 mmol/L (ref 101–111)
GFR calc Af Amer: 52 mL/min — ABNORMAL LOW (ref >60)
GFR calc non Af Amer: 44 mL/min — ABNORMAL LOW (ref >60)
GLUCOSE: 78 mg/dL (ref 65–99)
POTASSIUM: 4.4 mmol/L (ref 3.5–5.1)
SODIUM: 138 mmol/L (ref 135–145)

## 2016-11-27 LAB — CBC WITH DIFFERENTIAL/PLATELET
Basophils Absolute: 0 10*3/uL (ref 0.0–0.1)
Basophils Relative: 0 %
EOS ABS: 0 10*3/uL (ref 0.0–0.7)
EOS PCT: 1 %
HCT: 30.8 % — ABNORMAL LOW (ref 36.0–46.0)
Hemoglobin: 10.1 g/dL — ABNORMAL LOW (ref 12.0–15.0)
LYMPHS ABS: 1.5 10*3/uL (ref 0.7–4.0)
LYMPHS PCT: 20 %
MCH: 30.1 pg (ref 26.0–34.0)
MCHC: 32.8 g/dL (ref 30.0–36.0)
MCV: 91.9 fL (ref 78.0–100.0)
MONOS PCT: 14 %
Monocytes Absolute: 1.1 10*3/uL — ABNORMAL HIGH (ref 0.1–1.0)
Neutro Abs: 5.2 10*3/uL (ref 1.7–7.7)
Neutrophils Relative %: 65 %
Platelets: 288 10*3/uL (ref 150–400)
RBC: 3.35 MIL/uL — AB (ref 3.87–5.11)
RDW: 15.9 % — ABNORMAL HIGH (ref 11.5–15.5)
WBC: 7.9 10*3/uL (ref 4.0–10.5)

## 2016-11-27 LAB — URINALYSIS, ROUTINE W REFLEX MICROSCOPIC
BILIRUBIN URINE: NEGATIVE
Glucose, UA: NEGATIVE mg/dL
Hgb urine dipstick: NEGATIVE
KETONES UR: NEGATIVE mg/dL
NITRITE: NEGATIVE
PROTEIN: NEGATIVE mg/dL
Specific Gravity, Urine: 1.017 (ref 1.005–1.030)
pH: 7 (ref 5.0–8.0)

## 2016-11-27 LAB — PROTIME-INR
INR: 1.34
PROTHROMBIN TIME: 16.7 s — AB (ref 11.4–15.2)

## 2016-11-27 MED ORDER — ACETAMINOPHEN 325 MG PO TABS
650.0000 mg | ORAL_TABLET | Freq: Once | ORAL | Status: AC
  Administered 2016-11-27: 650 mg via ORAL
  Filled 2016-11-27: qty 2

## 2016-11-27 MED ORDER — SODIUM CHLORIDE 0.9 % IV BOLUS (SEPSIS)
500.0000 mL | Freq: Once | INTRAVENOUS | Status: AC
  Administered 2016-11-27: 500 mL via INTRAVENOUS

## 2016-11-27 NOTE — ED Notes (Signed)
Patient transported to X-ray 

## 2016-11-27 NOTE — ED Triage Notes (Signed)
Received pt from [facility] with c/o fell last night. Pt fell at home 1 week ago causing fracture to left hip. Pt had left hip replacement last Sunday. Per son who is POA in [place] reports that pt "is not acting her normal self." Pt AAOx4 with EMS and here in the ED.

## 2016-11-27 NOTE — ED Notes (Signed)
Pt aware that a urine sample is needed; pt is unable to provide one at this time

## 2016-11-27 NOTE — Discharge Instructions (Addendum)
Use tylenol 650 mg every four hours as needed for pain.  Decrease use of tramadol as much as possible.  Follow up with your doctor this week.  Stop ambien.

## 2016-11-27 NOTE — ED Provider Notes (Signed)
[facility] DEPT Provider Note   CSN: [id number] Arrival date & time: 11/27/16  0941     History   Chief Complaint Chief Complaint  Patient presents with  . Fall    HPI Kayla Kent is a 83 y.o. female.  HPI  Then a 83 year old female who was previously living independently until a femur fracture last week. She was hospitalized and had an orif . She was discharged to rehabilitation on Thursday, February 8. Early this a.m. she is attempting to transfer from her bed at the rehabilitation facility to her wheelchair without assistance. She fell to the ground from there. She struck her right temple, denies loss of consciousness, and otherwise has no complaints including headache. She is on eliquis which was restarted on Thursday. Family is also concerned that she has been somewhat confused at night. Review of records and discussion with nursing home reveal that she received Ambien on Thursday night tramadol for pain, and has received Xanax in the morning with her tramadol. She has previously been on tramadol although family reports is when necessary seems to be given scheduled in the a.m. since being in the nursing home.  Past Medical History:  Diagnosis Date  . Allergic rhinitis   . Anxiety   . HLD (hyperlipidemia)   . HTN (hypertension)   . Intolerance of drug    orthostatic  . Paroxysmal supraventricular tachycardia ([place])   . PVD (peripheral vascular disease) ([place])   . Syncope and collapse   . Uterine prolapse without mention of vaginal wall prolapse     Patient Active Problem List   Diagnosis Date Noted  . Postoperative anemia due to acute blood loss 11/24/2016  . Hematoma left thigh 11/24/2016  . Closed femur fracture ([place]) 11/20/2016  . Pedal edema 10/10/2016  . Persistent atrial fibrillation ([place]) 12/11/2014  . S/P ablation of atrial fibrillation 12/11/2014  . Orthostatic hypotension 12/11/2014  . Paroxysmal SVT (supraventricular tachycardia) ([place]) 12/11/2014  .  Atherosclerotic peripheral vascular disease ([place]) 12/11/2014  . PAF (paroxysmal atrial fibrillation) ([place]) 11/05/2014  . Orthostasis 10/03/2014  . Non-compliant behavior 10/01/2014  . TIA (transient ischemic attack) 09/23/2014  . Numbness of left hand   . Acute on chronic diastolic heart failure ([place]) 09/21/2014  . Fracture of femoral neck, right ([place]) 08/28/2014  . Distal radius fracture, right 08/28/2014  . Hip fracture requiring operative repair ([place]) 08/28/2014  . Paroxysmal spells 03/28/2014  . Seizures ([place]) 03/28/2013  . Syncope and collapse 07/27/2012  . Routine health maintenance 12/12/2011  . PERIPHERAL CIRCULATORY DISORDER 04/10/2010  . Chronic diastolic CHF (congestive heart failure) ([place]) 07/17/2009  . Depression 07/04/2009  . Anxiety 01/19/2008  . HLD (hyperlipidemia) 11/14/2007  . Essential hypertension 11/14/2007  . PAROXYSMAL ATRIAL TACHYCARDIA 11/14/2007  . UTERINE PROLAPSE 11/14/2007    Past Surgical History:  Procedure Laterality Date  . CARDIOVERSION N/A 11/05/2014   Procedure: CARDIOVERSION;  Surgeon: Candee Furbish, MD;  Location: [facility] ENDOSCOPY;  Service: Cardiovascular;  Laterality: N/A;  . cataract surgery  08/2015  . CHOLECYSTECTOMY    . corrective eye surgery     as a child  . INTRAMEDULLARY (IM) NAIL INTERTROCHANTERIC Left 11/21/2016   Procedure: INTRAMEDULLARY (IM) NAIL INTERTROCHANTRIC HEMI;  Surgeon: Newt Minion, MD;  Location: [facility];  Service: Orthopedics;  Laterality: Left;  . IVD removed    . OPEN REDUCTION INTERNAL FIXATION (ORIF) DISTAL RADIAL FRACTURE Right 08/29/2014   Procedure: OPEN REDUCTION INTERNAL FIXATION (ORIF) DISTAL RADIAL FRACTURE;  Surgeon: Marianna Payment, MD;  Location: [facility];  Service: Orthopedics;  Laterality: Right;  . RF ablation PSVT     summer '10  . stress cardiolite  08/05/93  . TONSILLECTOMY    . TOTAL HIP ARTHROPLASTY Right 08/29/2014   Procedure: Right Hip Hemi Arthroplasty;  Surgeon: Marianna Payment, MD;   Location: [facility];  Service: Orthopedics;  Laterality: Right;  Hip procedure 1st wants Peg Board, [employer], [place].     OB History    No data available       Home Medications    Prior to Admission medications   Medication Sig Start Date End Date Taking? Authorizing Provider  acetaminophen (TYLENOL) 500 MG tablet Take 1 tablet (500 mg total) by mouth every 6 (six) hours as needed for mild pain. 11/22/16   Newt Minion, MD  ALPRAZolam Duanne Moron) 0.25 MG tablet Take 1 tablet (0.25 mg total) by mouth 2 (two) times daily as needed for anxiety or sleep. 11/25/16   Venetia Maxon Rama, MD  amLODipine (NORVASC) 2.5 MG tablet Take 1 tablet (2.5 mg total) by mouth daily. 08/06/16 08/01/17  Fay Records, MD  atorvastatin (LIPITOR) 10 MG tablet TAKE ONE TABLET BY MOUTH ONCE DAILY AT 6 PM 09/06/16   Fay Records, MD  diltiazem (DILACOR XR) 180 MG 24 hr capsule TAKE ONE CAPSULE BY MOUTH ONCE DAILY 11/05/16   Biagio Borg, MD  ELIQUIS 2.5 MG TABS tablet TAKE ONE TABLET BY MOUTH TWICE DAILY 10/08/16   Fay Records, MD  levETIRAcetam (KEPPRA) 250 MG tablet TAKE ONE TABLET BY MOUTH TWICE DAILY 11/08/16   Biagio Borg, MD  polyethylene glycol [facility] / Floria Raveling) packet Take 17 g by mouth daily. 11/25/16   Christina P Rama, MD  senna-docusate (SENOKOT-S) 8.6-50 MG tablet Take 1 tablet by mouth 2 (two) times daily. 11/25/16   Venetia Maxon Rama, MD  traMADol (ULTRAM) 50 MG tablet Take 1-2 tablets (50-100 mg total) by mouth every 6 (six) hours as needed for moderate pain or severe pain. 11/25/16   Venetia Maxon Rama, MD  zolpidem (AMBIEN) 5 MG tablet Take 1 tablet (5 mg total) by mouth at bedtime as needed. for insomnia 11/25/16   Venetia Maxon Rama, MD    Family History Family History  Problem Relation Age of Onset  . Mental illness Father     suicide  . Arthritis Father   . Hyperlipidemia Sister   . Hypertension Sister   . Heart disease Brother     CAD/MI  . Hypertension Brother   . Coronary artery disease Brother     . Hypertension      family hx  . Heart attack Brother   . Colon cancer Neg Hx   . Breast cancer Neg Hx   . Diabetes Neg Hx   . Stroke Neg Hx     Social History Social History  Substance Use Topics  . Smoking status: Former Smoker    Quit date: 12/08/1955  . Smokeless tobacco: Never Used     Comment: quit in 1995   . Alcohol use No     Allergies   Chocolate; Codeine; Diazepam; Fruit & vegetable daily [nutritional supplements]; Iohexol; Latex; Peach [prunus persica]; Peanut-containing drug products; Penicillins; Strawberry extract; Sulfonamide derivatives; Wheat; Wheat bran; Sulfamethoxazole; Crestor [rosuvastatin calcium]; and Iodine   Review of Systems Review of Systems  Constitutional: Negative.   HENT: Negative.   Eyes: Negative for visual disturbance.  Respiratory: Negative.   Cardiovascular: Negative.   Gastrointestinal:  Negative.   Genitourinary: Negative.   Musculoskeletal: Negative.   Skin: Negative.   Neurological: Negative.   Hematological: Negative.   Psychiatric/Behavioral: Positive for confusion.  All other systems reviewed and are negative.    Physical Exam Updated Vital Signs BP (!) 142/51 (BP Location: Right Arm)   Pulse 79   Temp 98.1 F (36.7 C) (Oral)   Resp 16   Ht '5\' 7"'$  (1.702 m)   Wt 48.5 kg   SpO2 98%   BMI 16.76 kg/m   Physical Exam  Constitutional: She is oriented to person, place, and time. She appears well-developed and well-nourished. No distress.  HENT:  Head: Normocephalic and atraumatic.  Right Ear: External ear normal.  Left Ear: External ear normal.  Nose: Nose normal.  Mouth/Throat: Oropharynx is clear and moist.  Eyes: Pupils are equal, round, and reactive to light.  She has a right eye palsy since birth and is not able to move the right eye medially past midline  Neck: Normal range of motion. Neck supple. No JVD present. No tracheal deviation present.  Cardiovascular: Normal rate.  An irregularly irregular rhythm  present.  Pulmonary/Chest: Effort normal and breath sounds normal.  Abdominal: Soft. Bowel sounds are normal.  Musculoskeletal:  Left hip with dressing in place. There is minimal tenderness over this area. No redness or swelling is appreciated.  No swelling is noted in either lower leg  Neurological: She is alert and oriented to person, place, and time. She exhibits normal muscle tone. Coordination normal.  Skin: Skin is warm and dry. Capillary refill takes less than 2 seconds.  Psychiatric: She has a normal mood and affect.  Nursing note and vitals reviewed.    ED Treatments / Results  Labs (all labs ordered are listed, but only abnormal results are displayed) Labs Reviewed  CBC WITH DIFFERENTIAL/PLATELET - Abnormal; Notable for the following:       Result Value   RBC 3.35 (*)    Hemoglobin 10.1 (*)    HCT 30.8 (*)    RDW 15.9 (*)    Monocytes Absolute 1.1 (*)    All other components within normal limits  PROTIME-INR - Abnormal; Notable for the following:    Prothrombin Time 16.7 (*)    All other components within normal limits  URINALYSIS, ROUTINE W REFLEX MICROSCOPIC  BASIC METABOLIC PANEL    EKG  EKG Interpretation None       Radiology No results found.  Procedures Procedures (including critical care time)  Medications Ordered in ED Medications - No data to display   Initial Impression / Assessment and Plan / ED Course  I have reviewed the triage vital signs and the nursing notes.  Pertinent labs & imaging results that were available during my care of the patient were reviewed by me and considered in my medical decision making (see chart for details).   this is a 83 year old female with a recent hip fracture who presents today after fall when she attempts to transfer without assistance. There are no obvious injuries from this fall except for a abrasion noted. She had a head CT performed here she is on anticoagulation and she had a head abrasion on her face.  Her anemia is improved from before for hemoglobin up to death 10. Family was concerned that she has had some confusion at night in the facility. This is likely multifactorial as she is in a unfamiliar surroundings, and is on multiple medications which contribute to this including tramadol, Xanax, and Ambien.  When advised decreasing the polypharmacy. I advised using Tylenol as much is possible for pain. Advised that she follow-up with her doctor early next week. I discussed this with the daughter and she voices understanding. 1 fall 2 face abrasion 3 chronic anticoagulation 4 recent femur repair appears stable 5 anemia improved 6 increased creatinine and possible mild volume depletion IV fluids given here Plan Iv fluids. Final Clinical Impressions(s) / ED Diagnoses   Final diagnoses:  Fall, initial encounter    New Prescriptions New Prescriptions   No medications on file     Pattricia Boss, MD 11/27/16 1337

## 2016-12-06 ENCOUNTER — Ambulatory Visit (INDEPENDENT_AMBULATORY_CARE_PROVIDER_SITE_OTHER): Payer: Medicare Other | Admitting: Orthopedic Surgery

## 2016-12-06 ENCOUNTER — Encounter (INDEPENDENT_AMBULATORY_CARE_PROVIDER_SITE_OTHER): Payer: Self-pay | Admitting: Orthopedic Surgery

## 2016-12-06 VITALS — Ht 67.0 in | Wt 107.0 lb

## 2016-12-06 DIAGNOSIS — S72002S Fracture of unspecified part of neck of left femur, sequela: Secondary | ICD-10-CM

## 2016-12-06 NOTE — Progress Notes (Signed)
Office Visit Note   Patient: Kayla Kent           Date of Birth: 1932-05-05           MRN: [id number] Visit Date: 12/06/2016              Requested by: Biagio Borg, MD [address] [place], [place] [address] PCP: Cathlean Cower, MD  Chief Complaint  Patient presents with  . Left Hip - Routine Post Op    11/21/16 INTRAMEDULLARY (IM) NAIL INTERTROCHANTRIC HEMI    HPI: Patient is an 83 y.o female who present today for follow up for intertrochantericfracture of left hip. She is approximately 2 weeks post op. She is also status post fall at rehab, [facility], facility while trying to transfer from her wheelchair to bed without assistance on 11/27/16. Family states patient was given Ambien at facility. She was taken to ER. She is nonweightbearing with wheelchair. Maxcine Ham, RT    Assessment & Plan: Visit Diagnoses:  1. Closed fracture of left hip requiring operative repair, sequela     Plan: Continue physical therapy weightbearing as tolerated on the left. Continue using her walker. Staples harvested today she may discontinue the dressing tomorrow okay for getting the incision wet with bath or shower. Recommended against sleeping medication due to the fact that her last fall was after taking a benzodiazepine.  Repeat 2 view radiographs left hip at follow-up.  Follow-Up Instructions: Return in about 4 weeks (around 01/03/2017).   Ortho Exam Examination patient is alert oriented the incision is healing nicely the staples are removed dry dressing was applied. Radiographs were reviewed from the 10th which show stable internal fixation with no complicating features from her fall.  Imaging: No results found.  Orders:  No orders of the defined types were placed in this encounter.  No orders of the defined types were placed in this encounter.    Procedures: No procedures performed  Clinical Data: No additional findings.  Subjective: Review of  Systems  Objective: Vital Signs: Ht '5\' 7"'$  (1.702 m)   Wt 107 lb (48.5 kg)   BMI 16.76 kg/m   Specialty Comments:  No specialty comments available.  PMFS History: Patient Active Problem List   Diagnosis Date Noted  . Postoperative anemia due to acute blood loss 11/24/2016  . Hematoma left thigh 11/24/2016  . Closed femur fracture ([place]) 11/20/2016  . Pedal edema 10/10/2016  . Persistent atrial fibrillation ([place]) 12/11/2014  . S/P ablation of atrial fibrillation 12/11/2014  . Orthostatic hypotension 12/11/2014  . Paroxysmal SVT (supraventricular tachycardia) ([place]) 12/11/2014  . Atherosclerotic peripheral vascular disease ([place]) 12/11/2014  . PAF (paroxysmal atrial fibrillation) ([place]) 11/05/2014  . Orthostasis 10/03/2014  . Non-compliant behavior 10/01/2014  . TIA (transient ischemic attack) 09/23/2014  . Numbness of left hand   . Acute on chronic diastolic heart failure ([place]) 09/21/2014  . Fracture of femoral neck, right ([place]) 08/28/2014  . Distal radius fracture, right 08/28/2014  . Hip fracture requiring operative repair ([place]) 08/28/2014  . Paroxysmal spells 03/28/2014  . Seizures ([place]) 03/28/2013  . Syncope and collapse 07/27/2012  . Routine health maintenance 12/12/2011  . PERIPHERAL CIRCULATORY DISORDER 04/10/2010  . Chronic diastolic CHF (congestive heart failure) ([place]) 07/17/2009  . Depression 07/04/2009  . Anxiety 01/19/2008  . HLD (hyperlipidemia) 11/14/2007  . Essential hypertension 11/14/2007  . PAROXYSMAL ATRIAL TACHYCARDIA 11/14/2007  . UTERINE PROLAPSE 11/14/2007   Past Medical History:  Diagnosis Date  . Allergic  rhinitis   . Anxiety   . HLD (hyperlipidemia)   . HTN (hypertension)   . Intolerance of drug    orthostatic  . Paroxysmal supraventricular tachycardia ([place])   . PVD (peripheral vascular disease) ([place])   . Syncope and collapse   . Uterine prolapse without mention of vaginal wall prolapse     Family History  Problem Relation Age of Onset   . Mental illness Father     suicide  . Arthritis Father   . Hyperlipidemia Sister   . Hypertension Sister   . Heart disease Brother     CAD/MI  . Hypertension Brother   . Coronary artery disease Brother   . Hypertension      family hx  . Heart attack Brother   . Colon cancer Neg Hx   . Breast cancer Neg Hx   . Diabetes Neg Hx   . Stroke Neg Hx     Past Surgical History:  Procedure Laterality Date  . CARDIOVERSION N/A 11/05/2014   Procedure: CARDIOVERSION;  Surgeon: Candee Furbish, MD;  Location: [facility] ENDOSCOPY;  Service: Cardiovascular;  Laterality: N/A;  . cataract surgery  08/2015  . CHOLECYSTECTOMY    . corrective eye surgery     as a child  . INTRAMEDULLARY (IM) NAIL INTERTROCHANTERIC Left 11/21/2016   Procedure: INTRAMEDULLARY (IM) NAIL INTERTROCHANTRIC HEMI;  Surgeon: Newt Minion, MD;  Location: [facility];  Service: Orthopedics;  Laterality: Left;  . IVD removed    . OPEN REDUCTION INTERNAL FIXATION (ORIF) DISTAL RADIAL FRACTURE Right 08/29/2014   Procedure: OPEN REDUCTION INTERNAL FIXATION (ORIF) DISTAL RADIAL FRACTURE;  Surgeon: Marianna Payment, MD;  Location: [facility];  Service: Orthopedics;  Laterality: Right;  . RF ablation PSVT     summer '10  . stress cardiolite  08/05/93  . TONSILLECTOMY    . TOTAL HIP ARTHROPLASTY Right 08/29/2014   Procedure: Right Hip Hemi Arthroplasty;  Surgeon: Marianna Payment, MD;  Location: [facility];  Service: Orthopedics;  Laterality: Right;  Hip procedure 1st wants Peg Board, [employer], [place].    Social History   Occupational History  . retired Radio producer    Social History Main Topics  . Smoking status: Former Smoker    Quit date: 12/08/1955  . Smokeless tobacco: Never Used     Comment: quit in 1995   . Alcohol use No  . Drug use: No  . Sexual activity: No

## 2016-12-17 DIAGNOSIS — Z9181 History of falling: Secondary | ICD-10-CM | POA: Diagnosis not present

## 2016-12-17 DIAGNOSIS — S72002D Fracture of unspecified part of neck of left femur, subsequent encounter for closed fracture with routine healing: Secondary | ICD-10-CM | POA: Diagnosis not present

## 2016-12-17 DIAGNOSIS — D62 Acute posthemorrhagic anemia: Secondary | ICD-10-CM | POA: Diagnosis not present

## 2016-12-17 DIAGNOSIS — M6281 Muscle weakness (generalized): Secondary | ICD-10-CM | POA: Diagnosis not present

## 2016-12-17 DIAGNOSIS — R531 Weakness: Secondary | ICD-10-CM | POA: Diagnosis not present

## 2016-12-17 DIAGNOSIS — R2689 Other abnormalities of gait and mobility: Secondary | ICD-10-CM | POA: Diagnosis not present

## 2016-12-20 DIAGNOSIS — Z9181 History of falling: Secondary | ICD-10-CM | POA: Diagnosis not present

## 2016-12-20 DIAGNOSIS — R2689 Other abnormalities of gait and mobility: Secondary | ICD-10-CM | POA: Diagnosis not present

## 2016-12-20 DIAGNOSIS — D62 Acute posthemorrhagic anemia: Secondary | ICD-10-CM | POA: Diagnosis not present

## 2016-12-20 DIAGNOSIS — M6281 Muscle weakness (generalized): Secondary | ICD-10-CM | POA: Diagnosis not present

## 2016-12-20 DIAGNOSIS — S72002D Fracture of unspecified part of neck of left femur, subsequent encounter for closed fracture with routine healing: Secondary | ICD-10-CM | POA: Diagnosis not present

## 2016-12-20 DIAGNOSIS — R531 Weakness: Secondary | ICD-10-CM | POA: Diagnosis not present

## 2016-12-22 DIAGNOSIS — S72002D Fracture of unspecified part of neck of left femur, subsequent encounter for closed fracture with routine healing: Secondary | ICD-10-CM | POA: Diagnosis not present

## 2016-12-22 DIAGNOSIS — R2689 Other abnormalities of gait and mobility: Secondary | ICD-10-CM | POA: Diagnosis not present

## 2016-12-22 DIAGNOSIS — Z9181 History of falling: Secondary | ICD-10-CM | POA: Diagnosis not present

## 2016-12-22 DIAGNOSIS — D62 Acute posthemorrhagic anemia: Secondary | ICD-10-CM | POA: Diagnosis not present

## 2016-12-22 DIAGNOSIS — M6281 Muscle weakness (generalized): Secondary | ICD-10-CM | POA: Diagnosis not present

## 2016-12-22 DIAGNOSIS — R531 Weakness: Secondary | ICD-10-CM | POA: Diagnosis not present

## 2016-12-24 DIAGNOSIS — Z9181 History of falling: Secondary | ICD-10-CM | POA: Diagnosis not present

## 2016-12-24 DIAGNOSIS — R531 Weakness: Secondary | ICD-10-CM | POA: Diagnosis not present

## 2016-12-24 DIAGNOSIS — R2689 Other abnormalities of gait and mobility: Secondary | ICD-10-CM | POA: Diagnosis not present

## 2016-12-24 DIAGNOSIS — D62 Acute posthemorrhagic anemia: Secondary | ICD-10-CM | POA: Diagnosis not present

## 2016-12-24 DIAGNOSIS — M6281 Muscle weakness (generalized): Secondary | ICD-10-CM | POA: Diagnosis not present

## 2016-12-24 DIAGNOSIS — S72002D Fracture of unspecified part of neck of left femur, subsequent encounter for closed fracture with routine healing: Secondary | ICD-10-CM | POA: Diagnosis not present

## 2016-12-27 DIAGNOSIS — Z9181 History of falling: Secondary | ICD-10-CM | POA: Diagnosis not present

## 2016-12-27 DIAGNOSIS — S72002D Fracture of unspecified part of neck of left femur, subsequent encounter for closed fracture with routine healing: Secondary | ICD-10-CM | POA: Diagnosis not present

## 2016-12-27 DIAGNOSIS — M6281 Muscle weakness (generalized): Secondary | ICD-10-CM | POA: Diagnosis not present

## 2016-12-27 DIAGNOSIS — R2689 Other abnormalities of gait and mobility: Secondary | ICD-10-CM | POA: Diagnosis not present

## 2016-12-27 DIAGNOSIS — D62 Acute posthemorrhagic anemia: Secondary | ICD-10-CM | POA: Diagnosis not present

## 2016-12-27 DIAGNOSIS — R531 Weakness: Secondary | ICD-10-CM | POA: Diagnosis not present

## 2016-12-29 DIAGNOSIS — R531 Weakness: Secondary | ICD-10-CM | POA: Diagnosis not present

## 2016-12-29 DIAGNOSIS — D62 Acute posthemorrhagic anemia: Secondary | ICD-10-CM | POA: Diagnosis not present

## 2016-12-29 DIAGNOSIS — M6281 Muscle weakness (generalized): Secondary | ICD-10-CM | POA: Diagnosis not present

## 2016-12-29 DIAGNOSIS — R2689 Other abnormalities of gait and mobility: Secondary | ICD-10-CM | POA: Diagnosis not present

## 2016-12-29 DIAGNOSIS — S72002D Fracture of unspecified part of neck of left femur, subsequent encounter for closed fracture with routine healing: Secondary | ICD-10-CM | POA: Diagnosis not present

## 2016-12-29 DIAGNOSIS — Z9181 History of falling: Secondary | ICD-10-CM | POA: Diagnosis not present

## 2016-12-31 DIAGNOSIS — S72002D Fracture of unspecified part of neck of left femur, subsequent encounter for closed fracture with routine healing: Secondary | ICD-10-CM | POA: Diagnosis not present

## 2016-12-31 DIAGNOSIS — R2689 Other abnormalities of gait and mobility: Secondary | ICD-10-CM | POA: Diagnosis not present

## 2016-12-31 DIAGNOSIS — R531 Weakness: Secondary | ICD-10-CM | POA: Diagnosis not present

## 2016-12-31 DIAGNOSIS — D62 Acute posthemorrhagic anemia: Secondary | ICD-10-CM | POA: Diagnosis not present

## 2016-12-31 DIAGNOSIS — M6281 Muscle weakness (generalized): Secondary | ICD-10-CM | POA: Diagnosis not present

## 2016-12-31 DIAGNOSIS — Z9181 History of falling: Secondary | ICD-10-CM | POA: Diagnosis not present

## 2017-01-03 ENCOUNTER — Ambulatory Visit (INDEPENDENT_AMBULATORY_CARE_PROVIDER_SITE_OTHER): Payer: Medicare Other

## 2017-01-03 ENCOUNTER — Encounter (INDEPENDENT_AMBULATORY_CARE_PROVIDER_SITE_OTHER): Payer: Self-pay | Admitting: Orthopedic Surgery

## 2017-01-03 ENCOUNTER — Ambulatory Visit (INDEPENDENT_AMBULATORY_CARE_PROVIDER_SITE_OTHER): Payer: Medicare Other | Admitting: Orthopedic Surgery

## 2017-01-03 VITALS — Ht 67.0 in | Wt 107.0 lb

## 2017-01-03 DIAGNOSIS — M25552 Pain in left hip: Secondary | ICD-10-CM

## 2017-01-03 NOTE — Progress Notes (Signed)
Office Visit Note   Patient: Kayla Kent           Date of Birth: [birth year]           MRN: [id number] Visit Date: 01/03/2017              Requested by: Biagio Borg, MD [place], [place] [address] PCP: Cathlean Cower, MD  Chief Complaint  Patient presents with  . Left Hip - Routine Post Op    11/21/16 INTRAMEDULLARY (IM) NAIL INTERTROCHANTRIC HEMI      HPI: Patient is full weight bearing. She states that she feels well and questions when she can return to driving and if she needs to continue with physical therapy. Kayla Kent, RMA  Pt noticed limb length discrepancy. Have provided a heel lift for trial.   Assessment & Plan: Visit Diagnoses:  1. Pain in left hip     Plan: Continue PT. Will attempt to return to gym group classes. Follow up in office in 4 more weeks. Provided her with 2 heel lifts today.   Follow-Up Instructions: Return in about 4 weeks (around 01/31/2017).   Left Hip Exam   Tenderness  The patient is experiencing tenderness in the lateral and greater trochanter (mild).  Range of Motion  The patient has normal left hip ROM.  Comments:  Painless rom. Incision well healed.       Imaging: Xr Hip Unilat W Or W/o Pelvis 2-3 Views Left  Result Date: 01/03/2017 Radiographs of left hip show stable alignment of fixation. Interval callus formation. No complicating features.    Labs: Lab Results  Component Value Date   HGBA1C 5.5 09/23/2014   CRP 0.8 (H) 05/07/2009   REPTSTATUS 09/23/2014 FINAL 09/21/2014   CULT  09/21/2014    NO GROWTH 5 DAYS Performed at [facility] 10/06/2016    Orders:  Orders Placed This Encounter  Procedures  . XR HIP UNILAT W OR W/O PELVIS 2-3 VIEWS LEFT   No orders of the defined types were placed in this encounter.    Procedures: No procedures performed  Clinical Data: No additional findings.  Subjective: Review of Systems  Objective: Vital  Signs: Ht '5\' 7"'$  (1.702 m)   Wt 107 lb (48.5 kg)   BMI 16.76 kg/m   Specialty Comments:  No specialty comments available.  PMFS History: Patient Active Problem List   Diagnosis Date Noted  . Postoperative anemia due to acute blood loss 11/24/2016  . Hematoma left thigh 11/24/2016  . Closed femur fracture ([place]) 11/20/2016  . Pedal edema 10/10/2016  . Persistent atrial fibrillation ([place]) 12/11/2014  . S/P ablation of atrial fibrillation 12/11/2014  . Orthostatic hypotension 12/11/2014  . Paroxysmal SVT (supraventricular tachycardia) ([place]) 12/11/2014  . Atherosclerotic peripheral vascular disease ([place]) 12/11/2014  . PAF (paroxysmal atrial fibrillation) ([place]) 11/05/2014  . Orthostasis 10/03/2014  . Non-compliant behavior 10/01/2014  . TIA (transient ischemic attack) 09/23/2014  . Numbness of left hand   . Acute on chronic diastolic heart failure ([place]) 09/21/2014  . Fracture of femoral neck, right ([place]) 08/28/2014  . Distal radius fracture, right 08/28/2014  . Hip fracture requiring operative repair ([place]) 08/28/2014  . Paroxysmal spells 03/28/2014  . Seizures ([place]) 03/28/2013  . Syncope and collapse 07/27/2012  . Routine health maintenance 12/12/2011  . PERIPHERAL CIRCULATORY DISORDER 04/10/2010  . Chronic diastolic CHF (congestive heart failure) ([place]) 07/17/2009  . Depression 07/04/2009  . Anxiety  01/19/2008  . HLD (hyperlipidemia) 11/14/2007  . Essential hypertension 11/14/2007  . PAROXYSMAL ATRIAL TACHYCARDIA 11/14/2007  . UTERINE PROLAPSE 11/14/2007   Past Medical History:  Diagnosis Date  . Allergic rhinitis   . Anxiety   . HLD (hyperlipidemia)   . HTN (hypertension)   . Intolerance of drug    orthostatic  . Paroxysmal supraventricular tachycardia ([place])   . PVD (peripheral vascular disease) ([place])   . Syncope and collapse   . Uterine prolapse without mention of vaginal wall prolapse     Family History  Problem Relation Age of Onset  . Mental illness Father      suicide  . Arthritis Father   . Hyperlipidemia Sister   . Hypertension Sister   . Heart disease Brother     CAD/MI  . Hypertension Brother   . Coronary artery disease Brother   . Hypertension      family hx  . Heart attack Brother   . Colon cancer Neg Hx   . Breast cancer Neg Hx   . Diabetes Neg Hx   . Stroke Neg Hx     Past Surgical History:  Procedure Laterality Date  . CARDIOVERSION N/A 11/05/2014   Procedure: CARDIOVERSION;  Surgeon: Candee Furbish, MD;  Location: [facility] ENDOSCOPY;  Service: Cardiovascular;  Laterality: N/A;  . cataract surgery  08/2015  . CHOLECYSTECTOMY    . corrective eye surgery     as a child  . INTRAMEDULLARY (IM) NAIL INTERTROCHANTERIC Left 11/21/2016   Procedure: INTRAMEDULLARY (IM) NAIL INTERTROCHANTRIC HEMI;  Surgeon: Newt Minion, MD;  Location: [facility];  Service: Orthopedics;  Laterality: Left;  . IVD removed    . OPEN REDUCTION INTERNAL FIXATION (ORIF) DISTAL RADIAL FRACTURE Right 08/29/2014   Procedure: OPEN REDUCTION INTERNAL FIXATION (ORIF) DISTAL RADIAL FRACTURE;  Surgeon: Marianna Payment, MD;  Location: [facility];  Service: Orthopedics;  Laterality: Right;  . RF ablation PSVT     summer '10  . stress cardiolite  08/05/93  . TONSILLECTOMY    . TOTAL HIP ARTHROPLASTY Right 08/29/2014   Procedure: Right Hip Hemi Arthroplasty;  Surgeon: Marianna Payment, MD;  Location: [facility];  Service: Orthopedics;  Laterality: Right;  Hip procedure 1st wants Peg Board, [employer], [place].    Social History   Occupational History  . retired Radio producer    Social History Main Topics  . Smoking status: Former Smoker    Quit date: 12/08/1955  . Smokeless tobacco: Never Used     Comment: quit in 1995   . Alcohol use No  . Drug use: No  . Sexual activity: No

## 2017-01-04 DIAGNOSIS — R531 Weakness: Secondary | ICD-10-CM | POA: Diagnosis not present

## 2017-01-04 DIAGNOSIS — S72002D Fracture of unspecified part of neck of left femur, subsequent encounter for closed fracture with routine healing: Secondary | ICD-10-CM | POA: Diagnosis not present

## 2017-01-04 DIAGNOSIS — D62 Acute posthemorrhagic anemia: Secondary | ICD-10-CM | POA: Diagnosis not present

## 2017-01-04 DIAGNOSIS — Z9181 History of falling: Secondary | ICD-10-CM | POA: Diagnosis not present

## 2017-01-04 DIAGNOSIS — M6281 Muscle weakness (generalized): Secondary | ICD-10-CM | POA: Diagnosis not present

## 2017-01-04 DIAGNOSIS — R2689 Other abnormalities of gait and mobility: Secondary | ICD-10-CM | POA: Diagnosis not present

## 2017-01-06 DIAGNOSIS — Z9181 History of falling: Secondary | ICD-10-CM | POA: Diagnosis not present

## 2017-01-06 DIAGNOSIS — M6281 Muscle weakness (generalized): Secondary | ICD-10-CM | POA: Diagnosis not present

## 2017-01-06 DIAGNOSIS — D62 Acute posthemorrhagic anemia: Secondary | ICD-10-CM | POA: Diagnosis not present

## 2017-01-06 DIAGNOSIS — S72002D Fracture of unspecified part of neck of left femur, subsequent encounter for closed fracture with routine healing: Secondary | ICD-10-CM | POA: Diagnosis not present

## 2017-01-06 DIAGNOSIS — R2689 Other abnormalities of gait and mobility: Secondary | ICD-10-CM | POA: Diagnosis not present

## 2017-01-06 DIAGNOSIS — R531 Weakness: Secondary | ICD-10-CM | POA: Diagnosis not present

## 2017-01-10 DIAGNOSIS — D62 Acute posthemorrhagic anemia: Secondary | ICD-10-CM | POA: Diagnosis not present

## 2017-01-10 DIAGNOSIS — Z9181 History of falling: Secondary | ICD-10-CM | POA: Diagnosis not present

## 2017-01-10 DIAGNOSIS — M6281 Muscle weakness (generalized): Secondary | ICD-10-CM | POA: Diagnosis not present

## 2017-01-10 DIAGNOSIS — R531 Weakness: Secondary | ICD-10-CM | POA: Diagnosis not present

## 2017-01-10 DIAGNOSIS — S72002D Fracture of unspecified part of neck of left femur, subsequent encounter for closed fracture with routine healing: Secondary | ICD-10-CM | POA: Diagnosis not present

## 2017-01-10 DIAGNOSIS — R2689 Other abnormalities of gait and mobility: Secondary | ICD-10-CM | POA: Diagnosis not present

## 2017-01-12 DIAGNOSIS — D62 Acute posthemorrhagic anemia: Secondary | ICD-10-CM | POA: Diagnosis not present

## 2017-01-12 DIAGNOSIS — S72002D Fracture of unspecified part of neck of left femur, subsequent encounter for closed fracture with routine healing: Secondary | ICD-10-CM | POA: Diagnosis not present

## 2017-01-12 DIAGNOSIS — R531 Weakness: Secondary | ICD-10-CM | POA: Diagnosis not present

## 2017-01-12 DIAGNOSIS — Z9181 History of falling: Secondary | ICD-10-CM | POA: Diagnosis not present

## 2017-01-12 DIAGNOSIS — R2689 Other abnormalities of gait and mobility: Secondary | ICD-10-CM | POA: Diagnosis not present

## 2017-01-12 DIAGNOSIS — M6281 Muscle weakness (generalized): Secondary | ICD-10-CM | POA: Diagnosis not present

## 2017-01-14 DIAGNOSIS — S72002D Fracture of unspecified part of neck of left femur, subsequent encounter for closed fracture with routine healing: Secondary | ICD-10-CM | POA: Diagnosis not present

## 2017-01-14 DIAGNOSIS — Z9181 History of falling: Secondary | ICD-10-CM | POA: Diagnosis not present

## 2017-01-14 DIAGNOSIS — R2689 Other abnormalities of gait and mobility: Secondary | ICD-10-CM | POA: Diagnosis not present

## 2017-01-14 DIAGNOSIS — D62 Acute posthemorrhagic anemia: Secondary | ICD-10-CM | POA: Diagnosis not present

## 2017-01-14 DIAGNOSIS — M6281 Muscle weakness (generalized): Secondary | ICD-10-CM | POA: Diagnosis not present

## 2017-01-14 DIAGNOSIS — R531 Weakness: Secondary | ICD-10-CM | POA: Diagnosis not present

## 2017-01-17 DIAGNOSIS — M6281 Muscle weakness (generalized): Secondary | ICD-10-CM | POA: Diagnosis not present

## 2017-01-17 DIAGNOSIS — Z9181 History of falling: Secondary | ICD-10-CM | POA: Diagnosis not present

## 2017-01-17 DIAGNOSIS — S72002D Fracture of unspecified part of neck of left femur, subsequent encounter for closed fracture with routine healing: Secondary | ICD-10-CM | POA: Diagnosis not present

## 2017-01-17 DIAGNOSIS — D62 Acute posthemorrhagic anemia: Secondary | ICD-10-CM | POA: Diagnosis not present

## 2017-01-17 DIAGNOSIS — R531 Weakness: Secondary | ICD-10-CM | POA: Diagnosis not present

## 2017-01-17 DIAGNOSIS — R2689 Other abnormalities of gait and mobility: Secondary | ICD-10-CM | POA: Diagnosis not present

## 2017-01-19 DIAGNOSIS — M6281 Muscle weakness (generalized): Secondary | ICD-10-CM | POA: Diagnosis not present

## 2017-01-19 DIAGNOSIS — R2689 Other abnormalities of gait and mobility: Secondary | ICD-10-CM | POA: Diagnosis not present

## 2017-01-19 DIAGNOSIS — Z9181 History of falling: Secondary | ICD-10-CM | POA: Diagnosis not present

## 2017-01-19 DIAGNOSIS — D62 Acute posthemorrhagic anemia: Secondary | ICD-10-CM | POA: Diagnosis not present

## 2017-01-19 DIAGNOSIS — R531 Weakness: Secondary | ICD-10-CM | POA: Diagnosis not present

## 2017-01-19 DIAGNOSIS — S72002D Fracture of unspecified part of neck of left femur, subsequent encounter for closed fracture with routine healing: Secondary | ICD-10-CM | POA: Diagnosis not present

## 2017-01-21 DIAGNOSIS — R531 Weakness: Secondary | ICD-10-CM | POA: Diagnosis not present

## 2017-01-21 DIAGNOSIS — D62 Acute posthemorrhagic anemia: Secondary | ICD-10-CM | POA: Diagnosis not present

## 2017-01-21 DIAGNOSIS — R2689 Other abnormalities of gait and mobility: Secondary | ICD-10-CM | POA: Diagnosis not present

## 2017-01-21 DIAGNOSIS — S72002D Fracture of unspecified part of neck of left femur, subsequent encounter for closed fracture with routine healing: Secondary | ICD-10-CM | POA: Diagnosis not present

## 2017-01-21 DIAGNOSIS — Z9181 History of falling: Secondary | ICD-10-CM | POA: Diagnosis not present

## 2017-01-21 DIAGNOSIS — M6281 Muscle weakness (generalized): Secondary | ICD-10-CM | POA: Diagnosis not present

## 2017-01-31 ENCOUNTER — Encounter (INDEPENDENT_AMBULATORY_CARE_PROVIDER_SITE_OTHER): Payer: Self-pay | Admitting: Family

## 2017-01-31 ENCOUNTER — Ambulatory Visit (INDEPENDENT_AMBULATORY_CARE_PROVIDER_SITE_OTHER): Payer: Medicare Other | Admitting: Orthopedic Surgery

## 2017-01-31 ENCOUNTER — Ambulatory Visit (INDEPENDENT_AMBULATORY_CARE_PROVIDER_SITE_OTHER): Payer: Medicare Other | Admitting: Family

## 2017-01-31 VITALS — Ht 67.0 in | Wt 107.0 lb

## 2017-01-31 DIAGNOSIS — S72002D Fracture of unspecified part of neck of left femur, subsequent encounter for closed fracture with routine healing: Secondary | ICD-10-CM

## 2017-01-31 NOTE — Progress Notes (Signed)
Office Visit Note   Patient: Kayla Kent           Date of Birth: 12/13/1931           MRN: [id number] Visit Date: 01/31/2017              Requested by: Biagio Borg, MD [address], [place] [address] PCP: Cathlean Cower, MD  Chief Complaint  Patient presents with  . Left Hip - Routine Post Op    11/21/16 INTRAMEDULLARY (IM) NAIL INTERTROCHANTRIC HEMI      HPI: The patient is an 83 year old woman who presents today in follow-up status post intramedullary nailing left femur for intertrochanteric femur fracture with hemi arthroplasty. Him she is doing incredibly well. States she feels well. Does complain of some stiffness. Is in regular shoewear today. Is wearing the heel lift provided at the last visit. She has returned to her silver sneakers exercise program. States she exercises every day. Demonstrates single limb squats and lunges in the office.  Assessment & Plan: Visit Diagnoses:  1. Closed fracture of left hip requiring operative repair with routine healing, subsequent encounter     Plan: Activities as tolerated. She'll follow-up in office as needed.  Follow-Up Instructions: Return if symptoms worsen or fail to improve.   Ortho Exam  Patient is alert, oriented, no adenopathy, well-dressed, normal affect, normal respiratory effort. left proximal femur minimally tender to deep palpation. Range of motion of the hip is pain-free. Incisions well healed.  Imaging: No results found.  Labs: Lab Results  Component Value Date   HGBA1C 5.5 09/23/2014   CRP 0.8 (H) 05/07/2009   REPTSTATUS 09/23/2014 FINAL 09/21/2014   CULT  09/21/2014    NO GROWTH 5 DAYS Performed at [facility] 10/06/2016    Orders:  No orders of the defined types were placed in this encounter.  No orders of the defined types were placed in this encounter.    Procedures: No procedures performed  Clinical Data: No additional  findings.  ROS: Review of Systems  Constitutional: Negative for chills and fever.  Musculoskeletal: Negative for arthralgias and gait problem.    Objective: Vital Signs: Ht '5\' 7"'$  (1.702 m)   Wt 107 lb (48.5 kg)   BMI 16.76 kg/m   Specialty Comments:  No specialty comments available.  PMFS History: Patient Active Problem List   Diagnosis Date Noted  . Postoperative anemia due to acute blood loss 11/24/2016  . Hematoma left thigh 11/24/2016  . Closed femur fracture ([place]) 11/20/2016  . Pedal edema 10/10/2016  . Persistent atrial fibrillation ([place]) 12/11/2014  . S/P ablation of atrial fibrillation 12/11/2014  . Orthostatic hypotension 12/11/2014  . Paroxysmal SVT (supraventricular tachycardia) ([place]) 12/11/2014  . Atherosclerotic peripheral vascular disease ([place]) 12/11/2014  . PAF (paroxysmal atrial fibrillation) ([place]) 11/05/2014  . Orthostasis 10/03/2014  . Non-compliant behavior 10/01/2014  . TIA (transient ischemic attack) 09/23/2014  . Numbness of left hand   . Acute on chronic diastolic heart failure ([place]) 09/21/2014  . Fracture of femoral neck, right ([place]) 08/28/2014  . Distal radius fracture, right 08/28/2014  . Hip fracture requiring operative repair ([place]) 08/28/2014  . Paroxysmal spells 03/28/2014  . Seizures ([place]) 03/28/2013  . Syncope and collapse 07/27/2012  . Routine health maintenance 12/12/2011  . PERIPHERAL CIRCULATORY DISORDER 04/10/2010  . Chronic diastolic CHF (congestive heart failure) ([place]) 07/17/2009  . Depression 07/04/2009  . Anxiety 01/19/2008  . HLD (hyperlipidemia)  11/14/2007  . Essential hypertension 11/14/2007  . PAROXYSMAL ATRIAL TACHYCARDIA 11/14/2007  . UTERINE PROLAPSE 11/14/2007   Past Medical History:  Diagnosis Date  . Allergic rhinitis   . Anxiety   . HLD (hyperlipidemia)   . HTN (hypertension)   . Intolerance of drug    orthostatic  . Paroxysmal supraventricular tachycardia ([place])   . PVD (peripheral vascular disease) ([place])    . Syncope and collapse   . Uterine prolapse without mention of vaginal wall prolapse     Family History  Problem Relation Age of Onset  . Mental illness Father     suicide  . Arthritis Father   . Hyperlipidemia Sister   . Hypertension Sister   . Heart disease Brother     CAD/MI  . Hypertension Brother   . Coronary artery disease Brother   . Hypertension      family hx  . Heart attack Brother   . Colon cancer Neg Hx   . Breast cancer Neg Hx   . Diabetes Neg Hx   . Stroke Neg Hx     Past Surgical History:  Procedure Laterality Date  . CARDIOVERSION N/A 11/05/2014   Procedure: CARDIOVERSION;  Surgeon: Candee Furbish, MD;  Location: [facility] ENDOSCOPY;  Service: Cardiovascular;  Laterality: N/A;  . cataract surgery  08/2015  . CHOLECYSTECTOMY    . corrective eye surgery     as a child  . INTRAMEDULLARY (IM) NAIL INTERTROCHANTERIC Left 11/21/2016   Procedure: INTRAMEDULLARY (IM) NAIL INTERTROCHANTRIC HEMI;  Surgeon: Newt Minion, MD;  Location: [facility];  Service: Orthopedics;  Laterality: Left;  . IVD removed    . OPEN REDUCTION INTERNAL FIXATION (ORIF) DISTAL RADIAL FRACTURE Right 08/29/2014   Procedure: OPEN REDUCTION INTERNAL FIXATION (ORIF) DISTAL RADIAL FRACTURE;  Surgeon: Marianna Payment, MD;  Location: [facility];  Service: Orthopedics;  Laterality: Right;  . RF ablation PSVT     summer '10  . stress cardiolite  08/05/93  . TONSILLECTOMY    . TOTAL HIP ARTHROPLASTY Right 08/29/2014   Procedure: Right Hip Hemi Arthroplasty;  Surgeon: Marianna Payment, MD;  Location: [facility];  Service: Orthopedics;  Laterality: Right;  Hip procedure 1st wants Peg Board, [employer], [place].    Social History   Occupational History  . retired Radio producer    Social History Main Topics  . Smoking status: Former Smoker    Quit date: 12/08/1955  . Smokeless tobacco: Never Used     Comment: quit in 1995   . Alcohol use No  . Drug use: No  . Sexual activity: No

## 2017-02-23 ENCOUNTER — Other Ambulatory Visit: Payer: Self-pay | Admitting: Internal Medicine

## 2017-03-03 ENCOUNTER — Encounter: Payer: Self-pay | Admitting: Internal Medicine

## 2017-03-03 ENCOUNTER — Ambulatory Visit (INDEPENDENT_AMBULATORY_CARE_PROVIDER_SITE_OTHER): Payer: Medicare Other | Admitting: Internal Medicine

## 2017-03-03 VITALS — BP 150/76 | HR 64 | Ht 67.0 in | Wt 115.0 lb

## 2017-03-03 DIAGNOSIS — I10 Essential (primary) hypertension: Secondary | ICD-10-CM

## 2017-03-03 DIAGNOSIS — G4700 Insomnia, unspecified: Secondary | ICD-10-CM

## 2017-03-03 DIAGNOSIS — R350 Frequency of micturition: Secondary | ICD-10-CM | POA: Diagnosis not present

## 2017-03-03 DIAGNOSIS — K649 Unspecified hemorrhoids: Secondary | ICD-10-CM | POA: Diagnosis not present

## 2017-03-03 MED ORDER — TRAZODONE HCL 50 MG PO TABS: 25.0000 mg | ORAL_TABLET | Freq: Every evening | ORAL | 1 refills | Status: DC | PRN

## 2017-03-03 MED ORDER — LEVOFLOXACIN 250 MG PO TABS: 250.0000 mg | ORAL_TABLET | Freq: Every day | ORAL | 0 refills | Status: AC

## 2017-03-03 MED ORDER — HYDROCORTISONE ACETATE 25 MG RE SUPP: 25.0000 mg | Freq: Two times a day (BID) | RECTAL | 1 refills | Status: DC

## 2017-03-03 NOTE — Patient Instructions (Addendum)
Please take all new medication as prescribed - the levaquin for antibiotic, anusol HC suppository as needed, and the trazodone for sleep  Please continue all other medications as before, and refills have been done if requested.  Please have the pharmacy call with any other refills you may need.  Please keep your appointments with your specialists as you may have planned  Please go to the LAB in the Basement (turn left off the elevator) for the tests to be done today  You will be contacted by phone if any changes need to be made immediately.  Otherwise, you will receive a letter about your results with an explanation, but please check with MyChart first.  Please remember to sign up for MyChart if you have not done so, as this will be important to you in the future with finding out test results, communicating by private email, and scheduling acute appointments online when needed.

## 2017-03-03 NOTE — Progress Notes (Signed)
Subjective:    Patient ID: Kayla Kent, female    DOB: 03/04/1932, 83 y.o.   MRN: [id number]  HPI  Here to f/u with c/o 2-3 days urinary freq every few hours getting her up at night, with mild dysuria, but Denies urinary symptoms such as urgency, flank pain, hematuria or n/v, fever, chills.  Also Denies worsening reflux, abd pain, dysphagia, n/v, bowel change or blood, but has recurring active rectal pain due to hemorrhoids as before . Pt denies chest pain, increased sob or doe, wheezing, orthopnea, PND, increased LE swelling, palpitations, dizziness or syncope.   Pt denies polydipsia, polyuria.  Also with increased difficulty with sleeping, mostly getting to sleep, sometimes wakes up during night as well, ambien no longer working well, asks for change. Denies worsening depressive symptoms, suicidal ideation, or panic Past Medical History:  Diagnosis Date  . Allergic rhinitis   . Anxiety   . HLD (hyperlipidemia)   . HTN (hypertension)   . Intolerance of drug    orthostatic  . Paroxysmal supraventricular tachycardia ([place])   . PVD (peripheral vascular disease) ([place])   . Syncope and collapse   . Uterine prolapse without mention of vaginal wall prolapse    Past Surgical History:  Procedure Laterality Date  . CARDIOVERSION N/A 11/05/2014   Procedure: CARDIOVERSION;  Surgeon: Candee Furbish, MD;  Location: [facility] ENDOSCOPY;  Service: Cardiovascular;  Laterality: N/A;  . cataract surgery  08/2015  . CHOLECYSTECTOMY    . corrective eye surgery     as a child  . INTRAMEDULLARY (IM) NAIL INTERTROCHANTERIC Left 11/21/2016   Procedure: INTRAMEDULLARY (IM) NAIL INTERTROCHANTRIC HEMI;  Surgeon: Newt Minion, MD;  Location: [facility];  Service: Orthopedics;  Laterality: Left;  . IVD removed    . OPEN REDUCTION INTERNAL FIXATION (ORIF) DISTAL RADIAL FRACTURE Right 08/29/2014   Procedure: OPEN REDUCTION INTERNAL FIXATION (ORIF) DISTAL RADIAL FRACTURE;  Surgeon: Marianna Payment, MD;  Location: [facility];   Service: Orthopedics;  Laterality: Right;  . RF ablation PSVT     summer '10  . stress cardiolite  08/05/93  . TONSILLECTOMY    . TOTAL HIP ARTHROPLASTY Right 08/29/2014   Procedure: Right Hip Hemi Arthroplasty;  Surgeon: Marianna Payment, MD;  Location: [facility];  Service: Orthopedics;  Laterality: Right;  Hip procedure 1st wants Peg Board, [employer], [place].     reports that she quit smoking about 61 years ago. She has never used smokeless tobacco. She reports that she does not drink alcohol or use drugs. family history includes Arthritis in her father; Coronary artery disease in her brother; Heart attack in her brother; Heart disease in her brother; Hyperlipidemia in her sister; Hypertension in her brother and sister; Mental illness in her father. Allergies  Allergen Reactions  . Chocolate Hives  . Codeine Other (See Comments)    Patient states she acts crazy  . Diazepam Other (See Comments)    Patient states she acts crazy.  . Fruit & Vegetable Daily [Nutritional Supplements] Hives and Swelling    peaches  . Iohexol Hives     Code: HIVES, Desc: pt gets 13 hr pre-meds, Onset Date: [id number]   . Latex Hives  . Peach [Prunus Persica] Hives  . Peanut-Containing Drug Products Hives and Swelling  . Penicillins Hives, Itching and Swelling    Has patient had a PCN reaction causing immediate rash, facial/tongue/throat swelling, SOB or lightheadedness with hypotension: Yes Has patient had a PCN reaction causing severe rash involving  mucus membranes or skin necrosis: No Has patient had a PCN reaction that required hospitalization No Has patient had a PCN reaction occurring within the last 10 years: No If all of the above answers are "NO", then may proceed with Cephalosporin use.   . Strawberry Extract Hives  . Sulfonamide Derivatives Hives    nausea  . Wheat Shortness Of Breath    Shortness of breath  . Wheat Bran Shortness Of Breath  . Ambien [Zolpidem Tartrate] Other (See  Comments)    hallucinations  . Sulfamethoxazole Hives  . Crestor [Rosuvastatin Calcium] Rash  . Iodine Rash   Current Outpatient Prescriptions on File Prior to Visit  Medication Sig Dispense Refill  . acetaminophen (TYLENOL) 500 MG tablet Take 1 tablet (500 mg total) by mouth every 6 (six) hours as needed for mild pain. 30 tablet 0  . ALPRAZolam (XANAX) 0.25 MG tablet Take 1 tablet (0.25 mg total) by mouth 2 (two) times daily as needed for anxiety or sleep. 6 tablet 0  . amLODipine (NORVASC) 2.5 MG tablet Take 1 tablet (2.5 mg total) by mouth daily. 30 tablet 6  . atorvastatin (LIPITOR) 10 MG tablet TAKE ONE TABLET BY MOUTH ONCE DAILY AT 6 PM 30 tablet 10  . diltiazem (DILACOR XR) 180 MG 24 hr capsule TAKE ONE CAPSULE BY MOUTH ONCE DAILY 90 capsule 0  . ELIQUIS 2.5 MG TABS tablet TAKE ONE TABLET BY MOUTH TWICE DAILY 180 tablet 3  . levETIRAcetam (KEPPRA) 250 MG tablet TAKE ONE TABLET BY MOUTH TWICE DAILY 120 tablet 5  . polyethylene glycol (MIRALAX / GLYCOLAX) packet Take 17 g by mouth daily. 14 each 0  . senna-docusate (SENOKOT-S) 8.6-50 MG tablet Take 1 tablet by mouth 2 (two) times daily.    . traMADol (ULTRAM) 50 MG tablet Take 1-2 tablets (50-100 mg total) by mouth every 6 (six) hours as needed for moderate pain or severe pain. 10 tablet 0   No current facility-administered medications on file prior to visit.    Review of Systems  Constitutional: Negative for other unusual diaphoresis or sweats HENT: Negative for ear discharge or swelling Eyes: Negative for other worsening visual disturbances Respiratory: Negative for stridor or other swelling  Gastrointestinal: Negative for worsening distension or other blood Genitourinary: Negative for retention or other urinary change Musculoskeletal: Negative for other MSK pain or swelling Skin: Negative for color change or other new lesions Neurological: Negative for worsening tremors and other numbness  Psychiatric/Behavioral: Negative for  worsening agitation or other fatigue All other system neg per pt    Objective:   Physical Exam BP (!) 150/76   Pulse 64   Ht '5\' 7"'$  (1.702 m)   Wt 115 lb (52.2 kg)   SpO2 99%   BMI 18.01 kg/m   VS noted,  Constitutional: Pt appears in NAD HENT: Head: NCAT.  Right Ear: External ear normal.  Left Ear: External ear normal.  Eyes: . Pupils are equal, round, and reactive to light. Conjunctivae and EOM are normal Nose: without d/c or deformity Neck: Neck supple. Gross normal ROM Cardiovascular: Normal rate and regular rhythm.   Pulmonary/Chest: Effort normal and breath sounds without rales or wheezing.  Abd:  Soft, with low mid abd tender without guarding or rebound, ND, + BS, no organomegaly, no flank tender Neurological: Pt is alert. At baseline orientation, motor grossly intact Skin: Skin is warm. No rashes, other new lesions, no LE edema Psychiatric: Pt behavior is normal without agitation , mild nervous No other  exam findings    Assessment & Plan:

## 2017-03-06 NOTE — Assessment & Plan Note (Signed)
Ok for [employer] HC supp prn asd,  to f/u any worsening symptoms or concerns, declines GI referral for now

## 2017-03-06 NOTE — Assessment & Plan Note (Signed)
Mild elevated today, likely reactive, o/w stable overall by history and exam, recent data reviewed with pt, and pt to continue medical treatment as before,  to f/u any worsening symptoms or concerns BP Readings from Last 3 Encounters:  03/03/17 (!) 150/76  11/27/16 136/59  11/25/16 139/68

## 2017-03-06 NOTE — Assessment & Plan Note (Signed)
Likely uti, for empiric tx antibiotic and urine studies,  to f/u any worsening symptoms or concerns

## 2017-03-06 NOTE — Assessment & Plan Note (Signed)
Mild to mod persistent, to d/c ambien, start trazodone qhs prn,  to f/u any worsening symptoms or concerns

## 2017-03-17 NOTE — Progress Notes (Signed)
Cardiology Office Note   Date:  03/18/2017   ID:  Kayla SUIRE, DOB 1932-09-17, MRN [id number]  PCP:  Biagio Borg, MD  Cardiologist:   Dorris Carnes, MD    F/U of atrial fib and HTn     History of Present Illness: Kayla Kent is a 83 y.o. female with a history of atrial fib and HTN I saw her in November 2017   Since seen she fractured her hip  Underwent surgery  Pt says that she fell by getting tripped up with dog.  After surgery had fall when receiving pain meds   Conni Slipper has not had any falls  The pt says her breathing is OK  No dizziness  No falls  No palpitations  No CP         No outpatient prescriptions have been marked as taking for the 03/18/17 encounter (Office Visit) with Fay Records, MD.     Allergies:   Chocolate; Codeine; Diazepam; Fruit & vegetable daily [nutritional supplements]; Iohexol; Latex; Peach [prunus persica]; Peanut-containing drug products; Penicillins; Strawberry extract; Sulfonamide derivatives; Wheat; Wheat bran; Ambien [zolpidem tartrate]; Sulfamethoxazole; Crestor [rosuvastatin calcium]; and Iodine   Past Medical History:  Diagnosis Date  . Allergic rhinitis   . Anxiety   . HLD (hyperlipidemia)   . HTN (hypertension)   . Intolerance of drug    orthostatic  . Paroxysmal supraventricular tachycardia ([place])   . PVD (peripheral vascular disease) ([place])   . Syncope and collapse   . Uterine prolapse without mention of vaginal wall prolapse     Past Surgical History:  Procedure Laterality Date  . CARDIOVERSION N/A 11/05/2014   Procedure: CARDIOVERSION;  Surgeon: Candee Furbish, MD;  Location: [facility] ENDOSCOPY;  Service: Cardiovascular;  Laterality: N/A;  . cataract surgery  08/2015  . CHOLECYSTECTOMY    . corrective eye surgery     as a child  . INTRAMEDULLARY (IM) NAIL INTERTROCHANTERIC Left 11/21/2016   Procedure: INTRAMEDULLARY (IM) NAIL INTERTROCHANTRIC HEMI;  Surgeon: Newt Minion, MD;  Location: [facility];  Service: Orthopedics;  Laterality:  Left;  . IVD removed    . OPEN REDUCTION INTERNAL FIXATION (ORIF) DISTAL RADIAL FRACTURE Right 08/29/2014   Procedure: OPEN REDUCTION INTERNAL FIXATION (ORIF) DISTAL RADIAL FRACTURE;  Surgeon: Marianna Payment, MD;  Location: [facility];  Service: Orthopedics;  Laterality: Right;  . RF ablation PSVT     summer '10  . stress cardiolite  08/05/93  . TONSILLECTOMY    . TOTAL HIP ARTHROPLASTY Right 08/29/2014   Procedure: Right Hip Hemi Arthroplasty;  Surgeon: Marianna Payment, MD;  Location: [facility];  Service: Orthopedics;  Laterality: Right;  Hip procedure 1st wants Peg Board, [employer], [place].      Social History:  The patient  reports that she quit smoking about 61 years ago. She has never used smokeless tobacco. She reports that she does not drink alcohol or use drugs.   Family History:  The patient's family history includes Arthritis in her father; Coronary artery disease in her brother; Heart attack in her brother; Heart disease in her brother; Hyperlipidemia in her sister; Hypertension in her brother and sister; Mental illness in her father.    ROS:  Please see the history of present illness. All other systems are reviewed and  Negative to the above problem except as noted.    PHYSICAL EXAM: VS:  BP (!) 152/58   Pulse 86   Ht 5\' 7"  (1.702 m)  Wt 52.6 kg (116 lb)   BMI 18.17 kg/m   GEN: Well nourished, well developed, in no acute distress  HEENT: normal  Neck: no JVD, carotid bruits, or masses Cardiac: RRR; no murmurs, rubs, or gallops,no edema  Respiratory:  clear to auscultation bilaterally, normal work of breathing GI: soft, nontender, nondistended, + BS  No hepatomegaly  MS: no deformity Moving all extremities   Skin: warm and dry, no rash Neuro:  Strength and sensation are intact Psych: euthymic mood, full affect   EKG:  EKG is not ordered today.   Lipid Panel    Component Value Date/Time   CHOL 132 10/30/2015 0931   TRIG 71 10/30/2015 0931   TRIG 141  10/25/2006 0906   HDL 33 (L) 10/30/2015 0931   CHOLHDL 4.0 10/30/2015 0931   VLDL 14 10/30/2015 0931   LDLCALC 85 10/30/2015 0931   LDLDIRECT 175.3 01/19/2008 1216      Wt Readings from Last 3 Encounters:  03/18/17 52.6 kg (116 lb)  03/03/17 52.2 kg (115 lb)  01/31/17 48.5 kg (107 lb)      ASSESSMENT AND PLAN:  1  PAF  Pt denies palpitations  Keep on current mes   Had falls earlier this year  She is not falling now   Check CBC and BMET  2  HTN   BP is mildly increased   But with hx falls I would not push lower   Continue meds  She is on 2 Ca blockers  Tolerating  Not optimal but would continue    3  Renal  Pt treated for UTI earlier this spring  Deneis dysuria but does note increased urinary frequency  Will check UA    Current medicines are reviewed at length with the patient today.  The patient does not have concerns regarding medicines.  Signed, Dorris Carnes, MD  03/18/2017 11:46 AM    [facility] [place], [place], [place]  [address] Phone: [phone]; Fax: [phone]

## 2017-03-18 ENCOUNTER — Other Ambulatory Visit: Payer: Self-pay | Admitting: Internal Medicine

## 2017-03-18 ENCOUNTER — Encounter: Payer: Self-pay | Admitting: Internal Medicine

## 2017-03-18 ENCOUNTER — Encounter (INDEPENDENT_AMBULATORY_CARE_PROVIDER_SITE_OTHER): Payer: Self-pay

## 2017-03-18 ENCOUNTER — Ambulatory Visit (INDEPENDENT_AMBULATORY_CARE_PROVIDER_SITE_OTHER): Payer: Medicare Other | Admitting: Internal Medicine

## 2017-03-18 VITALS — BP 152/58 | HR 86 | Ht 67.0 in | Wt 116.0 lb

## 2017-03-18 DIAGNOSIS — R350 Frequency of micturition: Secondary | ICD-10-CM

## 2017-03-18 DIAGNOSIS — I480 Paroxysmal atrial fibrillation: Secondary | ICD-10-CM

## 2017-03-18 DIAGNOSIS — I5032 Chronic diastolic (congestive) heart failure: Secondary | ICD-10-CM | POA: Diagnosis not present

## 2017-03-18 DIAGNOSIS — I10 Essential (primary) hypertension: Secondary | ICD-10-CM | POA: Diagnosis not present

## 2017-03-18 NOTE — Patient Instructions (Signed)
Your physician recommends that you continue on your current medications as directed. Please refer to the Current Medication list given to you today.  Your physician recommends that you return for lab work today (BMET, CBC, UA)  Your physician wants you to follow-up in: [place].  You will receive a reminder letter in the mail two months in advance. If you don't receive a letter, please call our office to schedule the follow-up appointment.

## 2017-03-19 LAB — CBC
Hematocrit: 43.8 % (ref 34.0–46.6)
Hemoglobin: 14.5 g/dL (ref 11.1–15.9)
MCH: 29.9 pg (ref 26.6–33.0)
MCHC: 33.1 g/dL (ref 31.5–35.7)
MCV: 90 fL (ref 79–97)
Platelets: 224 10*3/uL (ref 150–379)
RBC: 4.85 x10E6/uL (ref 3.77–5.28)
RDW: 13.9 % (ref 12.3–15.4)
WBC: 8.2 10*3/uL (ref 3.4–10.8)

## 2017-03-19 LAB — BASIC METABOLIC PANEL
BUN/Creatinine Ratio: 26 (ref 12–28)
BUN: 23 mg/dL (ref 8–27)
CALCIUM: 9.7 mg/dL (ref 8.7–10.3)
CO2: 23 mmol/L (ref 18–29)
CREATININE: 0.88 mg/dL (ref 0.57–1.00)
Chloride: 104 mmol/L (ref 96–106)
GFR calc Af Amer: 70 mL/min/{1.73_m2} (ref >59)
GFR, EST NON AFRICAN AMERICAN: 61 mL/min/{1.73_m2} (ref >59)
Glucose: 63 mg/dL — ABNORMAL LOW (ref 65–99)
POTASSIUM: 4.2 mmol/L (ref 3.5–5.2)
Sodium: 144 mmol/L (ref 134–144)

## 2017-03-19 NOTE — Addendum Note (Signed)
Addendum  created 03/19/17 0907 by Duane Boston, MD   Sign clinical note

## 2017-03-22 NOTE — Telephone Encounter (Signed)
Done hardcopy to Shir[place]  

## 2017-03-22 NOTE — Telephone Encounter (Signed)
faxed

## 2017-03-23 DIAGNOSIS — R350 Frequency of micturition: Secondary | ICD-10-CM | POA: Diagnosis not present

## 2017-03-24 LAB — URINALYSIS
BILIRUBIN UA: NEGATIVE
GLUCOSE, UA: NEGATIVE
Ketones, UA: NEGATIVE
LEUKOCYTES UA: NEGATIVE
Nitrite, UA: NEGATIVE
PH UA: 5 (ref 5.0–7.5)
Protein, UA: NEGATIVE
RBC UA: NEGATIVE
SPEC GRAV UA: 1.024 (ref 1.005–1.030)
Urobilinogen, Ur: 0.2 mg/dL (ref 0.2–1.0)

## 2017-03-28 ENCOUNTER — Telehealth: Payer: Self-pay | Admitting: Internal Medicine

## 2017-03-28 DIAGNOSIS — R300 Dysuria: Secondary | ICD-10-CM

## 2017-03-28 NOTE — Telephone Encounter (Signed)
Patient states Dr. Jenny Reichmann was treating her for a UTI.  Patient states she finished antibiotic and thought UTI went away but has the burning sensation again.  Patient is requesting Dr. Jenny Reichmann to send anther round of antibiotics to [facility] on [address].

## 2017-03-29 NOTE — Telephone Encounter (Signed)
Dr John please advise.  

## 2017-03-29 NOTE — Telephone Encounter (Signed)
Patient has been informed and expressed understanding.

## 2017-03-29 NOTE — Telephone Encounter (Signed)
Called pt, LVM.   

## 2017-03-29 NOTE — Telephone Encounter (Signed)
The June 6 UA was normal  This does not always mean there is no infection, and if she likes, she can return for a urine culture (I have ordered)

## 2017-03-29 NOTE — Telephone Encounter (Signed)
The pt mentioned these issues to Dr Harrington Challenger while she was seeing her on Friday, 03/18/17 and she ordered a Urinalysis. Would these results be okay or should she have them rechecked with the order that Dr Jenny Reichmann put in? Please advise.

## 2017-03-29 NOTE — Telephone Encounter (Signed)
Sometimes burning happens and no actual infection, but still could very well be an infection  I would ideally like her to at least have urine studies done to check this, so I have done orders

## 2017-03-30 ENCOUNTER — Other Ambulatory Visit (INDEPENDENT_AMBULATORY_CARE_PROVIDER_SITE_OTHER): Payer: Medicare Other

## 2017-03-30 DIAGNOSIS — R300 Dysuria: Secondary | ICD-10-CM

## 2017-03-31 ENCOUNTER — Telehealth: Payer: Self-pay | Admitting: Internal Medicine

## 2017-03-31 NOTE — Telephone Encounter (Signed)
Reviewed labs and UA with patient.

## 2017-03-31 NOTE — Telephone Encounter (Signed)
New message    Pt is returning call about labs.

## 2017-04-01 ENCOUNTER — Other Ambulatory Visit: Payer: Self-pay | Admitting: Internal Medicine

## 2017-04-01 ENCOUNTER — Other Ambulatory Visit: Payer: Medicare Other

## 2017-04-01 ENCOUNTER — Encounter: Payer: Self-pay | Admitting: Internal Medicine

## 2017-04-01 LAB — URINALYSIS, ROUTINE W REFLEX MICROSCOPIC
Bilirubin Urine: NEGATIVE
Hgb urine dipstick: NEGATIVE
Ketones, ur: NEGATIVE
Leukocytes, UA: NEGATIVE
Nitrite: NEGATIVE
Specific Gravity, Urine: 1.015 (ref 1.000–1.030)
Total Protein, Urine: NEGATIVE
Urine Glucose: NEGATIVE
Urobilinogen, UA: 0.2 (ref 0.0–1.0)
pH: 6 (ref 5.0–8.0)

## 2017-04-04 ENCOUNTER — Other Ambulatory Visit: Payer: Self-pay | Admitting: Internal Medicine

## 2017-04-04 LAB — URINE CULTURE

## 2017-04-04 MED ORDER — NITROFURANTOIN MACROCRYSTAL 50 MG PO CAPS: 50.0000 mg | ORAL_CAPSULE | Freq: Two times a day (BID) | ORAL | 0 refills | Status: DC

## 2017-04-04 NOTE — Telephone Encounter (Signed)
Ok to contact pt; urine cx is c/w mild lower grade UTI  Ok for antibx  - done erx

## 2017-04-05 NOTE — Telephone Encounter (Signed)
Called pt, LVM.   

## 2017-04-06 ENCOUNTER — Inpatient Hospital Stay (HOSPITAL_COMMUNITY)
Admission: EM | Admit: 2017-04-06 | Discharge: 2017-04-09 | DRG: 871 | Disposition: A | Discharge status: Home Health | Payer: Medicare Other | Attending: Internal Medicine | Admitting: Internal Medicine

## 2017-04-06 ENCOUNTER — Emergency Department (HOSPITAL_COMMUNITY): Payer: Medicare Other

## 2017-04-06 ENCOUNTER — Encounter (HOSPITAL_COMMUNITY): Payer: Self-pay | Admitting: Emergency Medicine

## 2017-04-06 DIAGNOSIS — I6789 Other cerebrovascular disease: Secondary | ICD-10-CM | POA: Diagnosis not present

## 2017-04-06 DIAGNOSIS — Z87891 Personal history of nicotine dependence: Secondary | ICD-10-CM

## 2017-04-06 DIAGNOSIS — E861 Hypovolemia: Secondary | ICD-10-CM | POA: Diagnosis present

## 2017-04-06 DIAGNOSIS — I4891 Unspecified atrial fibrillation: Secondary | ICD-10-CM | POA: Diagnosis present

## 2017-04-06 DIAGNOSIS — I130 Hypertensive heart and chronic kidney disease with heart failure and stage 1 through stage 4 chronic kidney disease, or unspecified chronic kidney disease: Secondary | ICD-10-CM | POA: Diagnosis present

## 2017-04-06 DIAGNOSIS — R29818 Other symptoms and signs involving the nervous system: Secondary | ICD-10-CM | POA: Diagnosis not present

## 2017-04-06 DIAGNOSIS — R079 Chest pain, unspecified: Secondary | ICD-10-CM

## 2017-04-06 DIAGNOSIS — Z7901 Long term (current) use of anticoagulants: Secondary | ICD-10-CM

## 2017-04-06 DIAGNOSIS — I5032 Chronic diastolic (congestive) heart failure: Secondary | ICD-10-CM | POA: Diagnosis present

## 2017-04-06 DIAGNOSIS — R911 Solitary pulmonary nodule: Secondary | ICD-10-CM | POA: Diagnosis not present

## 2017-04-06 DIAGNOSIS — N179 Acute kidney failure, unspecified: Secondary | ICD-10-CM | POA: Diagnosis present

## 2017-04-06 DIAGNOSIS — G9389 Other specified disorders of brain: Secondary | ICD-10-CM | POA: Diagnosis present

## 2017-04-06 DIAGNOSIS — E785 Hyperlipidemia, unspecified: Secondary | ICD-10-CM | POA: Diagnosis present

## 2017-04-06 DIAGNOSIS — G40909 Epilepsy, unspecified, not intractable, without status epilepticus: Secondary | ICD-10-CM | POA: Diagnosis present

## 2017-04-06 DIAGNOSIS — R6520 Severe sepsis without septic shock: Secondary | ICD-10-CM

## 2017-04-06 DIAGNOSIS — F419 Anxiety disorder, unspecified: Secondary | ICD-10-CM | POA: Diagnosis present

## 2017-04-06 DIAGNOSIS — G9341 Metabolic encephalopathy: Secondary | ICD-10-CM | POA: Diagnosis present

## 2017-04-06 DIAGNOSIS — T670XXA Heatstroke and sunstroke, initial encounter: Secondary | ICD-10-CM | POA: Diagnosis not present

## 2017-04-06 DIAGNOSIS — Z96641 Presence of right artificial hip joint: Secondary | ICD-10-CM | POA: Diagnosis present

## 2017-04-06 DIAGNOSIS — R569 Unspecified convulsions: Secondary | ICD-10-CM | POA: Diagnosis not present

## 2017-04-06 DIAGNOSIS — M7989 Other specified soft tissue disorders: Secondary | ICD-10-CM | POA: Diagnosis not present

## 2017-04-06 DIAGNOSIS — N390 Urinary tract infection, site not specified: Secondary | ICD-10-CM | POA: Diagnosis present

## 2017-04-06 DIAGNOSIS — E872 Acidosis: Secondary | ICD-10-CM | POA: Diagnosis present

## 2017-04-06 DIAGNOSIS — I248 Other forms of acute ischemic heart disease: Secondary | ICD-10-CM | POA: Diagnosis present

## 2017-04-06 DIAGNOSIS — N183 Chronic kidney disease, stage 3 (moderate): Secondary | ICD-10-CM | POA: Diagnosis present

## 2017-04-06 DIAGNOSIS — Z79899 Other long term (current) drug therapy: Secondary | ICD-10-CM

## 2017-04-06 DIAGNOSIS — A419 Sepsis, unspecified organism: Principal | ICD-10-CM | POA: Diagnosis present

## 2017-04-06 DIAGNOSIS — I340 Nonrheumatic mitral (valve) insufficiency: Secondary | ICD-10-CM | POA: Diagnosis not present

## 2017-04-06 LAB — I-STAT CHEM 8, ED
BUN: 19 mg/dL (ref 6–20)
CHLORIDE: 102 mmol/L (ref 101–111)
CREATININE: 1 mg/dL (ref 0.44–1.00)
Calcium, Ion: 1.01 mmol/L — ABNORMAL LOW (ref 1.15–1.40)
Glucose, Bld: 156 mg/dL — ABNORMAL HIGH (ref 65–99)
HEMATOCRIT: 38 % (ref 36.0–46.0)
HEMOGLOBIN: 12.9 g/dL (ref 12.0–15.0)
POTASSIUM: 3.8 mmol/L (ref 3.5–5.1)
Sodium: 138 mmol/L (ref 135–145)
TCO2: 22 mmol/L (ref 0–100)

## 2017-04-06 LAB — TSH: TSH: 1.11 u[IU]/mL (ref 0.350–4.500)

## 2017-04-06 LAB — PROTIME-INR
INR: 1.51
INR: 1.59
PROTHROMBIN TIME: 19.2 s — AB (ref 11.4–15.2)
Prothrombin Time: 18.3 seconds — ABNORMAL HIGH (ref 11.4–15.2)

## 2017-04-06 LAB — I-STAT CG4 LACTIC ACID, ED: Lactic Acid, Venous: 3.98 mmol/L (ref 0.5–1.9)

## 2017-04-06 LAB — DIFFERENTIAL
BASOS ABS: 0 10*3/uL (ref 0.0–0.1)
Basophils Relative: 0 %
Eosinophils Absolute: 0 10*3/uL (ref 0.0–0.7)
Eosinophils Relative: 0 %
LYMPHS ABS: 0.3 10*3/uL — AB (ref 0.7–4.0)
Lymphocytes Relative: 1 %
MONO ABS: 1 10*3/uL (ref 0.1–1.0)
MONOS PCT: 3 %
NEUTROS PCT: 96 %
Neutro Abs: 30.7 10*3/uL — ABNORMAL HIGH (ref 1.7–7.7)

## 2017-04-06 LAB — RAPID URINE DRUG SCREEN, HOSP PERFORMED
Amphetamines: NOT DETECTED
BARBITURATES: NOT DETECTED
Benzodiazepines: POSITIVE — AB
Cocaine: NOT DETECTED
Opiates: NOT DETECTED
Tetrahydrocannabinol: NOT DETECTED

## 2017-04-06 LAB — COMPREHENSIVE METABOLIC PANEL
ALBUMIN: 2.8 g/dL — AB (ref 3.5–5.0)
ALT: 47 U/L (ref 14–54)
ANION GAP: 9 (ref 5–15)
AST: 58 U/L — ABNORMAL HIGH (ref 15–41)
Alkaline Phosphatase: 118 U/L (ref 38–126)
BUN: 18 mg/dL (ref 6–20)
CALCIUM: 8 mg/dL — AB (ref 8.9–10.3)
CO2: 21 mmol/L — AB (ref 22–32)
CREATININE: 1.36 mg/dL — AB (ref 0.44–1.00)
Chloride: 105 mmol/L (ref 101–111)
GFR calc Af Amer: 40 mL/min — ABNORMAL LOW (ref >60)
GFR calc non Af Amer: 35 mL/min — ABNORMAL LOW (ref >60)
GLUCOSE: 156 mg/dL — AB (ref 65–99)
POTASSIUM: 3.8 mmol/L (ref 3.5–5.1)
Sodium: 135 mmol/L (ref 135–145)
TOTAL PROTEIN: 5.6 g/dL — AB (ref 6.5–8.1)
Total Bilirubin: 0.7 mg/dL (ref 0.3–1.2)

## 2017-04-06 LAB — CBC
HCT: 37.6 % (ref 36.0–46.0)
HEMOGLOBIN: 12.1 g/dL (ref 12.0–15.0)
MCH: 29.3 pg (ref 26.0–34.0)
MCHC: 32.2 g/dL (ref 30.0–36.0)
MCV: 91 fL (ref 78.0–100.0)
Platelets: 186 10*3/uL (ref 150–400)
RBC: 4.13 MIL/uL (ref 3.87–5.11)
RDW: 14.6 % (ref 11.5–15.5)
WBC: 32 10*3/uL — AB (ref 4.0–10.5)

## 2017-04-06 LAB — I-STAT TROPONIN, ED: TROPONIN I, POC: 0.04 ng/mL (ref 0.00–0.08)

## 2017-04-06 LAB — URINALYSIS, ROUTINE W REFLEX MICROSCOPIC
Bacteria, UA: NONE SEEN
Bilirubin Urine: NEGATIVE
GLUCOSE, UA: NEGATIVE mg/dL
HGB URINE DIPSTICK: NEGATIVE
KETONES UR: NEGATIVE mg/dL
LEUKOCYTES UA: NEGATIVE
NITRITE: NEGATIVE
PH: 5 (ref 5.0–8.0)
Protein, ur: 30 mg/dL — AB
Specific Gravity, Urine: 1.024 (ref 1.005–1.030)

## 2017-04-06 LAB — ETHANOL: Alcohol, Ethyl (B): <5 mg/dL (ref <5)

## 2017-04-06 LAB — APTT
APTT: 34 s (ref 24–36)
aPTT: 38 seconds — ABNORMAL HIGH (ref 24–36)

## 2017-04-06 LAB — CBG MONITORING, ED: Glucose-Capillary: 163 mg/dL — ABNORMAL HIGH (ref 65–99)

## 2017-04-06 LAB — TROPONIN I: Troponin I: 0.07 ng/mL (ref <0.03)

## 2017-04-06 LAB — PROCALCITONIN: Procalcitonin: 7.84 ng/mL

## 2017-04-06 MED ORDER — ACETAMINOPHEN 325 MG PO TABS: 650.0000 mg | ORAL_TABLET | Freq: Four times a day (QID) | ORAL | Status: DC | PRN

## 2017-04-06 MED ORDER — ONDANSETRON HCL 4 MG PO TABS: 4.0000 mg | ORAL_TABLET | Freq: Four times a day (QID) | ORAL | Status: DC | PRN

## 2017-04-06 MED ORDER — SALINE SPRAY 0.65 % NA SOLN: 1.0000 | NASAL | Status: DC | PRN

## 2017-04-06 MED ORDER — ATORVASTATIN CALCIUM 10 MG PO TABS
10.0000 mg | ORAL_TABLET | Freq: Every day | ORAL | Status: DC
  Administered 2017-04-06 – 2017-04-08 (×3): 10 mg via ORAL
  Filled 2017-04-06 (×3): qty 1

## 2017-04-06 MED ORDER — ONDANSETRON HCL 4 MG/2ML IJ SOLN
4.0000 mg | Freq: Four times a day (QID) | INTRAMUSCULAR | Status: DC | PRN
  Administered 2017-04-06: 4 mg via INTRAVENOUS
  Filled 2017-04-06: qty 2

## 2017-04-06 MED ORDER — SODIUM CHLORIDE 0.9 % IV SOLN
INTRAVENOUS | Status: AC
  Administered 2017-04-06 – 2017-04-07 (×2): via INTRAVENOUS

## 2017-04-06 MED ORDER — ACETAMINOPHEN 500 MG PO TABS
1000.0000 mg | ORAL_TABLET | Freq: Once | ORAL | Status: AC
  Administered 2017-04-06: 1000 mg via ORAL
  Filled 2017-04-06: qty 2

## 2017-04-06 MED ORDER — SODIUM CHLORIDE 0.9 % IV BOLUS (SEPSIS)
250.0000 mL | Freq: Once | INTRAVENOUS | Status: AC
Start: 2017-04-06 — End: 2017-04-06
  Administered 2017-04-06: 250 mL via INTRAVENOUS

## 2017-04-06 MED ORDER — LEVOFLOXACIN IN D5W 500 MG/100ML IV SOLN
500.0000 mg | INTRAVENOUS | Status: DC
  Administered 2017-04-06 – 2017-04-08 (×3): 500 mg via INTRAVENOUS
  Filled 2017-04-06 (×4): qty 100

## 2017-04-06 MED ORDER — TRAZODONE HCL 50 MG PO TABS
25.0000 mg | ORAL_TABLET | Freq: Every evening | ORAL | Status: DC | PRN
  Administered 2017-04-06: 50 mg via ORAL
  Filled 2017-04-06: qty 1

## 2017-04-06 MED ORDER — APIXABAN 2.5 MG PO TABS
2.5000 mg | ORAL_TABLET | Freq: Two times a day (BID) | ORAL | Status: DC
  Administered 2017-04-06 – 2017-04-09 (×6): 2.5 mg via ORAL
  Filled 2017-04-06 (×6): qty 1

## 2017-04-06 MED ORDER — SODIUM CHLORIDE 0.9 % IV BOLUS (SEPSIS)
500.0000 mL | Freq: Once | INTRAVENOUS | Status: AC
  Administered 2017-04-06: 500 mL via INTRAVENOUS

## 2017-04-06 MED ORDER — ACETAMINOPHEN 650 MG RE SUPP: 650.0000 mg | Freq: Four times a day (QID) | RECTAL | Status: DC | PRN

## 2017-04-06 MED ORDER — LEVETIRACETAM 250 MG PO TABS
250.0000 mg | ORAL_TABLET | Freq: Two times a day (BID) | ORAL | Status: DC
  Administered 2017-04-06 – 2017-04-09 (×6): 250 mg via ORAL
  Filled 2017-04-06 (×6): qty 1

## 2017-04-06 MED ORDER — ALPRAZOLAM 0.25 MG PO TABS
0.2500 mg | ORAL_TABLET | Freq: Two times a day (BID) | ORAL | Status: DC | PRN
  Administered 2017-04-07 – 2017-04-08 (×2): 0.25 mg via ORAL
  Filled 2017-04-06 (×2): qty 1

## 2017-04-06 MED ORDER — LEVOFLOXACIN IN D5W 750 MG/150ML IV SOLN: 750.0000 mg | Freq: Once | INTRAVENOUS | Status: DC

## 2017-04-06 MED ORDER — AMLODIPINE BESYLATE 2.5 MG PO TABS
2.5000 mg | ORAL_TABLET | Freq: Every day | ORAL | Status: DC
  Administered 2017-04-07 – 2017-04-09 (×3): 2.5 mg via ORAL
  Filled 2017-04-06 (×3): qty 1

## 2017-04-06 MED ORDER — SODIUM CHLORIDE 0.9 % IV BOLUS (SEPSIS)
1000.0000 mL | Freq: Once | INTRAVENOUS | Status: AC
  Administered 2017-04-06: 1000 mL via INTRAVENOUS

## 2017-04-06 MED ORDER — DILTIAZEM HCL ER COATED BEADS 180 MG PO CP24
180.0000 mg | ORAL_CAPSULE | Freq: Every day | ORAL | Status: DC
  Administered 2017-04-07 – 2017-04-09 (×3): 180 mg via ORAL
  Filled 2017-04-06 (×4): qty 1

## 2017-04-06 NOTE — ED Notes (Signed)
hospitalist at the bedside 

## 2017-04-06 NOTE — ED Provider Notes (Signed)
[facility] DEPT Provider Note   CSN: [id number] Arrival date & time: 04/06/17  8182   An emergency department physician performed an initial assessment on this suspected stroke patient at 34.  History   Chief Complaint Chief Complaint  Patient presents with  . Code Stroke   Triage Note 06/20 1925 Pt brought to ED by [facility] from home for stroke like symptoms, pt was found on the couch of her sun room unable to stand up by her self with some mental status change, last seen well by daughter at 1530 pt having a temp of 101 on EMS arrival very weak on her legs AO x 4. Pt was seen and treat for a UTI the past 2 weeks. A fib on the monitor with a HR of 120, pt has prior hx of afib on Xarellto.    HPI Kayla Kent is a 83 y.o. female.  The history is provided by the patient and the EMS personnel.  Altered Mental Status   This is a new problem. The current episode started 3 to 5 hours ago (Last known normal 1530.). The problem has been resolved. Associated symptoms include confusion and weakness.   Patient mental status had improved upon arrival to the emergency department. She reported that she has been dealing with a urinary tract infection for several weeks and was recently placed on Macrobid by her cardiologist and PCP.  Patient reports that she still having some urinary symptoms including dysuria and frequency.  Denied any nausea, vomiting, chest pain, shortness of breath, abdominal pain. Past Medical History:  Diagnosis Date  . Allergic rhinitis   . Anxiety   . HLD (hyperlipidemia)   . HTN (hypertension)   . Intolerance of drug    orthostatic  . Paroxysmal supraventricular tachycardia ([place])   . PVD (peripheral vascular disease) ([place])   . Syncope and collapse   . Uterine prolapse without mention of vaginal wall prolapse     Patient Active Problem List   Diagnosis Date Noted  . Sepsis ([place]) 04/06/2017  . Atrial fibrillation with RVR ([place]) 04/06/2017  . Urinary  frequency 03/03/2017  . Hemorrhoids 03/03/2017  . Insomnia 03/03/2017  . Postoperative anemia due to acute blood loss 11/24/2016  . Hematoma left thigh 11/24/2016  . Closed femur fracture ([place]) 11/20/2016  . Pedal edema 10/10/2016  . Persistent atrial fibrillation ([place]) 12/11/2014  . S/P ablation of atrial fibrillation 12/11/2014  . Orthostatic hypotension 12/11/2014  . Paroxysmal SVT (supraventricular tachycardia) ([place]) 12/11/2014  . Atherosclerotic peripheral vascular disease ([place]) 12/11/2014  . PAF (paroxysmal atrial fibrillation) ([place]) 11/05/2014  . Orthostasis 10/03/2014  . Non-compliant behavior 10/01/2014  . TIA (transient ischemic attack) 09/23/2014  . Numbness of left hand   . Acute on chronic diastolic heart failure ([place]) 09/21/2014  . Fracture of femoral neck, right ([place]) 08/28/2014  . Distal radius fracture, right 08/28/2014  . Hip fracture requiring operative repair ([place]) 08/28/2014  . Paroxysmal spells 03/28/2014  . Seizures ([place]) 03/28/2013  . Syncope and collapse 07/27/2012  . Routine health maintenance 12/12/2011  . PERIPHERAL CIRCULATORY DISORDER 04/10/2010  . Chronic diastolic CHF (congestive heart failure) ([place]) 07/17/2009  . Depression 07/04/2009  . Anxiety 01/19/2008  . HLD (hyperlipidemia) 11/14/2007  . Essential hypertension 11/14/2007  . PAROXYSMAL ATRIAL TACHYCARDIA 11/14/2007  . UTERINE PROLAPSE 11/14/2007    Past Surgical History:  Procedure Laterality Date  . CARDIOVERSION N/A 11/05/2014   Procedure: CARDIOVERSION;  Surgeon: Candee Furbish, MD;  Location: [facility];  Service: Cardiovascular;  Laterality: N/A;  . cataract surgery  08/2015  . CHOLECYSTECTOMY    . corrective eye surgery     as a child  . INTRAMEDULLARY (IM) NAIL INTERTROCHANTERIC Left 11/21/2016   Procedure: INTRAMEDULLARY (IM) NAIL INTERTROCHANTRIC HEMI;  Surgeon: Newt Minion, MD;  Location: [facility];  Service: Orthopedics;  Laterality: Left;  . IVD removed    . OPEN REDUCTION  INTERNAL FIXATION (ORIF) DISTAL RADIAL FRACTURE Right 08/29/2014   Procedure: OPEN REDUCTION INTERNAL FIXATION (ORIF) DISTAL RADIAL FRACTURE;  Surgeon: Marianna Payment, MD;  Location: [facility];  Service: Orthopedics;  Laterality: Right;  . RF ablation PSVT     summer '10  . stress cardiolite  08/05/93  . TONSILLECTOMY    . TOTAL HIP ARTHROPLASTY Right 08/29/2014   Procedure: Right Hip Hemi Arthroplasty;  Surgeon: Marianna Payment, MD;  Location: [facility];  Service: Orthopedics;  Laterality: Right;  Hip procedure 1st wants Peg Board, [employer], [place].     OB History    No data available       Home Medications    Prior to Admission medications   Medication Sig Start Date End Date Taking? Authorizing Provider  acetaminophen (TYLENOL) 500 MG tablet Take 1 tablet (500 mg total) by mouth every 6 (six) hours as needed for mild pain. 11/22/16  Yes Newt Minion, MD  ALPRAZolam Duanne Moron) 0.25 MG tablet TAKE ONE TABLET BY MOUTH TWICE DAILY AS NEEDED 03/22/17  Yes Biagio Borg, MD  amLODipine (NORVASC) 2.5 MG tablet TAKE ONE TABLET BY MOUTH ONCE DAILY 04/01/17  Yes Fay Records, MD  atorvastatin (LIPITOR) 10 MG tablet TAKE ONE TABLET BY MOUTH ONCE DAILY AT 6 PM 09/06/16  Yes Fay Records, MD  diltiazem (DILACOR XR) 180 MG 24 hr capsule TAKE ONE CAPSULE BY MOUTH ONCE DAILY 02/23/17  Yes Biagio Borg, MD  ELIQUIS 2.5 MG TABS tablet TAKE ONE TABLET BY MOUTH TWICE DAILY 10/08/16  Yes Fay Records, MD  hydrocortisone (ANUSOL-HC) 25 MG suppository Place 1 suppository (25 mg total) rectally every 12 (twelve) hours. 03/03/17 03/03/18 Yes Biagio Borg, MD  levETIRAcetam (KEPPRA) 250 MG tablet TAKE ONE TABLET BY MOUTH TWICE DAILY 11/08/16  Yes Biagio Borg, MD  sodium chloride (OCEAN) 0.65 % SOLN nasal spray Place 1 spray into both nostrils as needed for congestion.   Yes [provider]  traMADol (ULTRAM) 50 MG tablet Take 1-2 tablets (50-100 mg total) by mouth every 6 (six) hours as needed for  moderate pain or severe pain. 11/25/16  Yes Rama, Venetia Maxon, MD  traZODone (DESYREL) 50 MG tablet Take 0.5-1 tablets (25-50 mg total) by mouth at bedtime as needed for sleep. 03/03/17  Yes Biagio Borg, MD  nitrofurantoin (MACRODANTIN) 50 MG capsule Take 1 capsule (50 mg total) by mouth 2 (two) times daily. 04/04/17   Biagio Borg, MD  polyethylene glycol [facility] / Floria Raveling) packet Take 17 g by mouth daily. Patient not taking: Reported on 04/06/2017 11/25/16   Rama, Venetia Maxon, MD  senna-docusate (SENOKOT-S) 8.6-50 MG tablet Take 1 tablet by mouth 2 (two) times daily. Patient not taking: Reported on 04/06/2017 11/25/16   Rama, Venetia Maxon, MD    Family History Family History  Problem Relation Age of Onset  . Mental illness Father        suicide  . Arthritis Father   . Hyperlipidemia Sister   . Hypertension Sister   . Heart disease Brother  CAD/MI  . Hypertension Brother   . Coronary artery disease Brother   . Hypertension Unknown        family hx  . Heart attack Brother   . Colon cancer Neg Hx   . Breast cancer Neg Hx   . Diabetes Neg Hx   . Stroke Neg Hx     Social History Social History  Substance Use Topics  . Smoking status: Former Smoker    Quit date: 12/08/1955  . Smokeless tobacco: Never Used     Comment: quit in 1995   . Alcohol use No     Allergies   Chocolate; Codeine; Diazepam; Fruit & vegetable daily [nutritional supplements]; Iohexol; Latex; Peach [prunus persica]; Peanut-containing drug products; Penicillins; Strawberry extract; Sulfonamide derivatives; Wheat; Wheat bran; Ambien [zolpidem tartrate]; Sulfamethoxazole; Crestor [rosuvastatin calcium]; and Iodine   Review of Systems Review of Systems  Neurological: Positive for weakness.  Psychiatric/Behavioral: Positive for confusion.    All other systems are reviewed and are negative for acute change except as noted in the HPI  Physical Exam Updated Vital Signs BP 139/72   Pulse (!) 122   Temp (!)  102 F (38.9 C) (Oral)   Resp (!) 21   Ht 5\' 7"  (1.702 m)   Wt 52.6 kg (116 lb)   SpO2 94%   BMI 18.17 kg/m   Physical Exam  Constitutional: She is oriented to person, place, and time. She appears well-developed and well-nourished. No distress.  HENT:  Head: Normocephalic and atraumatic.  Nose: Nose normal.  Eyes: Conjunctivae and EOM are normal. Pupils are equal, round, and reactive to light. Right eye exhibits no discharge. Left eye exhibits no discharge. No scleral icterus.  Neck: Normal range of motion. Neck supple.  Cardiovascular: Normal rate and regular rhythm.  Exam reveals no gallop and no friction rub.   No murmur heard. Pulmonary/Chest: Effort normal and breath sounds normal. No stridor. No respiratory distress. She has no rales.  Abdominal: Soft. She exhibits no distension. There is no tenderness.  Musculoskeletal: She exhibits no edema or tenderness.  Neurological: She is alert and oriented to person, place, and time.  Mental Status: Alert and oriented to person, place, and time. Attention and concentration normal. Speech clear.   Cranial Nerves  II Visual Fields: Intact to confrontation. Visual fields intact. III, IV, VI: right Exotropia. Pupils equal and reactive to light and near. Full eye movement without nystagmus  V Facial Sensation: Normal. No weakness of masticatory muscles  VII: No facial weakness or asymmetry  VIII Auditory Acuity: Grossly normal  IX/X: The uvula is midline; the palate elevates symmetrically  XI: Normal sternocleidomastoid and trapezius strength  XII: The tongue is midline. No atrophy or fasciculations.   Motor System: Muscle Strength: 5/5 and symmetric in the upper and lower extremities. No pronation or drift.  Muscle Tone: Tone and muscle bulk are normal in the upper and lower extremities.   Reflexes: DTRs: 1+ and symmetrical in all four extremities. Plantar responses are flexor bilaterally.  Coordination: Intact finger-to-nose,  heel-to-shin. No tremor.  Sensation: Intact to light touch, and pinprick.   Gait: deferred   Skin: Skin is warm and dry. No rash noted. She is not diaphoretic. No erythema.  Psychiatric: She has a normal mood and affect.  Vitals reviewed.    ED Treatments / Results  Labs (all labs ordered are listed, but only abnormal results are displayed) Labs Reviewed  PROTIME-INR - Abnormal; Notable for the following:  Result Value   Prothrombin Time 18.3 (*)    All other components within normal limits  CBC - Abnormal; Notable for the following:    WBC 32.0 (*)    All other components within normal limits  DIFFERENTIAL - Abnormal; Notable for the following:    Neutro Abs 30.7 (*)    Lymphs Abs 0.3 (*)    All other components within normal limits  COMPREHENSIVE METABOLIC PANEL - Abnormal; Notable for the following:    CO2 21 (*)    Glucose, Bld 156 (*)    Creatinine, Ser 1.36 (*)    Calcium 8.0 (*)    Total Protein 5.6 (*)    Albumin 2.8 (*)    AST 58 (*)    GFR calc non Af Amer 35 (*)    GFR calc Af Amer 40 (*)    All other components within normal limits  RAPID URINE DRUG SCREEN, HOSP PERFORMED - Abnormal; Notable for the following:    Benzodiazepines POSITIVE (*)    All other components within normal limits  URINALYSIS, ROUTINE W REFLEX MICROSCOPIC - Abnormal; Notable for the following:    Color, Urine AMBER (*)    Protein, ur 30 (*)    Squamous Epithelial / LPF 0-5 (*)    All other components within normal limits  TROPONIN I - Abnormal; Notable for the following:    Troponin I 0.07 (*)    All other components within normal limits  PROTIME-INR - Abnormal; Notable for the following:    Prothrombin Time 19.2 (*)    All other components within normal limits  APTT - Abnormal; Notable for the following:    aPTT 38 (*)    All other components within normal limits  I-STAT CHEM 8, ED - Abnormal; Notable for the following:    Glucose, Bld 156 (*)    Calcium, Ion 1.01 (*)     All other components within normal limits  CBG MONITORING, ED - Abnormal; Notable for the following:    Glucose-Capillary 163 (*)    All other components within normal limits  I-STAT CG4 LACTIC ACID, ED - Abnormal; Notable for the following:    Lactic Acid, Venous 3.98 (*)    All other components within normal limits  CULTURE, BLOOD (ROUTINE X 2)  CULTURE, BLOOD (ROUTINE X 2)  URINE CULTURE  ETHANOL  APTT  LACTIC ACID, PLASMA  PROCALCITONIN  TSH  TROPONIN I  TROPONIN I  CBC WITH DIFFERENTIAL/PLATELET  COMPREHENSIVE METABOLIC PANEL  LACTIC ACID, PLASMA  I-STAT TROPOININ, ED    EKG  EKG Interpretation  Date/Time:  Wednesday April 06 2017 19:27:42 EDT Ventricular Rate:  105 PR Interval:    QRS Duration: 85 QT Interval:  367 QTC Calculation: 485 R Axis:   97 Text Interpretation:  Atrial fibrillation Multiple ventricular premature complexes Anterior infarct, old Borderline repolarization abnormality No significant change since last tracing Confirmed by Addison Lank [phone]) on 04/06/2017 7:31:08 PM       Radiology Dg Chest Port 1 View  Result Date: 04/06/2017 CLINICAL DATA:  RIGHT side chest pain, sepsis, UTI, fever, new altered mental status EXAM: PORTABLE CHEST 1 VIEW COMPARISON:  Portable exam 1949 hours compared to 11/20/2016 FINDINGS: Enlargement of cardiac silhouette with pulmonary vascular congestion. Atherosclerotic calcification aorta. Emphysematous changes with scattered mild interstitial prominence throughout both lungs new since previous exam question mild pulmonary edema. No pleural effusion or pneumothorax. Diffuse osseous demineralization. IMPRESSION: Enlargement of cardiac silhouette with pulmonary vascular congestion and new interstitial  infiltrates question pulmonary edema. Aortic Atherosclerosis (ICD10-I70.0) and Emphysema (ICD10-J43.9). Electronically Signed   By: Lavonia Dana M.D.   On: 04/06/2017 20:02   Ct Head Code Stroke W/o Cm  Result Date:  04/06/2017 CLINICAL DATA:  Code stroke. Unsteady gait. Code stroke. History of hypertension, hyperlipidemia. EXAM: CT HEAD WITHOUT CONTRAST TECHNIQUE: Contiguous axial images were obtained from the base of the skull through the vertex without intravenous contrast. COMPARISON:  CT HEAD November 27, 2016 FINDINGS: BRAIN: No intraparenchymal hemorrhage, mass effect nor midline shift. The ventricles and sulci are normal for age. Patchy supratentorial white matter hypodensities less than expected for patient's age, though non-specific are most compatible with chronic small vessel ischemic disease. RIGHT temporal lobe encephalomalacia. Old RIGHT caudate head lacunar infarct. No acute large vascular territory infarcts. No abnormal extra-axial fluid collections. Basal cisterns are patent. VASCULAR: Mild calcific atherosclerosis of the carotid siphons. SKULL: No skull fracture. Chronic deformity LEFT mandible condyles. No significant scalp soft tissue swelling. SINUSES/ORBITS: Trace mastoid effusions. Paranasal sinuses are well-aerated. The included ocular globes and orbital contents are non-suspicious. Status post LEFT ocular lens implant. OTHER: None. ASPECTS [facility] Stroke Program Early CT Score) - Ganglionic level infarction (caudate, lentiform nuclei, internal capsule, insula, M1-M3 cortex): 7 - Supraganglionic infarction (M4-M6 cortex): 3 Total score (0-10 with 10 being normal): 10 IMPRESSION: 1. No acute intracranial process. Stable examination including old RIGHT basal ganglia lacunar infarct and RIGHT temporal lobe encephalomalacia. 2. ASPECTS is 10. Critical Value/emergent results were called by telephone at the time of interpretation on 04/06/2017 at 7:25 pm to Dr. Cheral Marker, Neurology , who verbally acknowledged these results. Electronically Signed   By: Elon Alas M.D.   On: 04/06/2017 19:26    Procedures Procedures (including critical care time) CRITICAL CARE Performed by: Grayce Sessions  Jedi Catalfamo Total critical care time: 35 minutes Critical care time was exclusive of separately billable procedures and treating other patients. Critical care was necessary to treat or prevent imminent or life-threatening deterioration. Critical care was time spent personally by me on the following activities: development of treatment plan with patient and/or surrogate as well as nursing, discussions with consultants, evaluation of patient's response to treatment, examination of patient, obtaining history from patient or surrogate, ordering and performing treatments and interventions, ordering and review of laboratory studies, ordering and review of radiographic studies, pulse oximetry and re-evaluation of patient's condition.   Medications Ordered in ED Medications  levofloxacin (LEVAQUIN) IVPB 500 mg (0 mg Intravenous Stopped 04/07/17 0000)  sodium chloride (OCEAN) 0.65 % nasal spray 1 spray (not administered)  amLODipine (NORVASC) tablet 2.5 mg (not administered)  ALPRAZolam (XANAX) tablet 0.25 mg (not administered)  traZODone (DESYREL) tablet 25-50 mg (50 mg Oral Given 04/06/17 2340)  diltiazem (DILACOR XR) 24 hr capsule 180 mg (not administered)  levETIRAcetam (KEPPRA) tablet 250 mg (250 mg Oral Given 04/06/17 2353)  apixaban (ELIQUIS) tablet 2.5 mg (2.5 mg Oral Given 04/06/17 2340)  atorvastatin (LIPITOR) tablet 10 mg (10 mg Oral Given 04/06/17 2340)  acetaminophen (TYLENOL) tablet 650 mg (not administered)    Or  acetaminophen (TYLENOL) suppository 650 mg (not administered)  ondansetron (ZOFRAN) tablet 4 mg ( Oral See Alternative 04/06/17 2341)    Or  ondansetron (ZOFRAN) injection 4 mg (4 mg Intravenous Given 04/06/17 2341)  0.9 %  sodium chloride infusion ( Intravenous New Bag/Given 04/06/17 2340)  sodium chloride 0.9 % bolus 1,000 mL (0 mLs Intravenous Stopped 04/06/17 2042)    And  sodium chloride 0.9 % bolus 500 mL (  0 mLs Intravenous Stopped 04/06/17 2124)    And  sodium chloride 0.9 %  bolus 250 mL (0 mLs Intravenous Stopped 04/06/17 2215)  acetaminophen (TYLENOL) tablet 1,000 mg (1,000 mg Oral Given 04/06/17 2054)     Initial Impression / Assessment and Plan / ED Course  I have reviewed the triage vital signs and the nursing notes.  Pertinent labs & imaging results that were available during my care of the patient were reviewed by me and considered in my medical decision making (see chart for details).  Clinical Course as of Apr 07 37  Wed Apr 06, 2017  1940 Patient was a code stroke by EMS in route for altered mental status and difficulty ambulating. CT head without evidence of acute intracranial hemorrhage. Per EMS report, patient was noted to be febrile. Given the lack of neurologic deficits noted on exam. We'll continue with sepsis workup.  [PC]  1942 Febrile. With leukocytosis and elevated lactic acid concerning for severe sepsis. Do not feel that her symptoms are related to CVA.   Likely urinary source given h/o recurrent UTI currently being managed with macrobid.  Code sepsis initiated. Pt is tachycardic (AFRVR) and lactic acid is approaching 4, will provide 30cc/kg of IVF and start empiric Abx.  [PC]  2030 Patient admitted to internal medicine for further management.  [PC]    Clinical Course User Index [PC] Adonai Selsor, Grayce Sessions, MD      Final Clinical Impressions(s) / ED Diagnoses   Final diagnoses:  Severe sepsis [facility])  Atrial fibrillation with RVR ([place])      Delonna Ney, Grayce Sessions, MD 04/07/17 [phone]

## 2017-04-06 NOTE — ED Triage Notes (Signed)
Pt brought to ED by [facility] from home for stroke like symptoms, pt was found on the couch of her sun room unable to stand up by her self with some mental status change, last seen well by daughter at 1530 pt having a temp of 101 on EMS arrival very weak on her legs AO x 4. Pt was seen and treat for a UTI the past 2 weeks. A fib on the monitor with a HR of 120, pt has prior hx of afib on Xarellto.

## 2017-04-06 NOTE — Consult Note (Addendum)
Admission H&P    Chief Complaint: Gait instability   HPI: Kayla Kent is an 83 y.o. female who was brought in by EMS after being found by her family on the couch in her sunroom unable to stand up on her own as well as with AMS. LKN was 1530. EMS noted a tympanic temp of 101 and she also felt warm to the touch. She was "very weak on her legs" but was alert and oriented. She was treated for a UTI 2 weeks ago. She has a history of atrial fibrillation and is on Xarelto. Monitor showed a-fib with HR of 120. She has no prior history of stroke.   Past Medical History:  Diagnosis Date  . Allergic rhinitis   . Anxiety   . HLD (hyperlipidemia)   . HTN (hypertension)   . Intolerance of drug    orthostatic  . Paroxysmal supraventricular tachycardia ([place])   . PVD (peripheral vascular disease) ([place])   . Syncope and collapse   . Uterine prolapse without mention of vaginal wall prolapse     Past Surgical History:  Procedure Laterality Date  . CARDIOVERSION N/A 11/05/2014   Procedure: CARDIOVERSION;  Surgeon: Candee Furbish, MD;  Location: [facility] ENDOSCOPY;  Service: Cardiovascular;  Laterality: N/A;  . cataract surgery  08/2015  . CHOLECYSTECTOMY    . corrective eye surgery     as a child  . INTRAMEDULLARY (IM) NAIL INTERTROCHANTERIC Left 11/21/2016   Procedure: INTRAMEDULLARY (IM) NAIL INTERTROCHANTRIC HEMI;  Surgeon: Newt Minion, MD;  Location: [place];  Service: Orthopedics;  Laterality: Left;  . IVD removed    . OPEN REDUCTION INTERNAL FIXATION (ORIF) DISTAL RADIAL FRACTURE Right 08/29/2014   Procedure: OPEN REDUCTION INTERNAL FIXATION (ORIF) DISTAL RADIAL FRACTURE;  Surgeon: Marianna Payment, MD;  Location: [place];  Service: Orthopedics;  Laterality: Right;  . RF ablation PSVT     summer '10  . stress cardiolite  08/05/93  . TONSILLECTOMY    . TOTAL HIP ARTHROPLASTY Right 08/29/2014   Procedure: Right Hip Hemi Arthroplasty;  Surgeon: Marianna Payment, MD;  Location: [place];  Service:  Orthopedics;  Laterality: Right;  Hip procedure 1st wants Peg Board, [employer], [place].     Family History  Problem Relation Age of Onset  . Mental illness Father        suicide  . Arthritis Father   . Hyperlipidemia Sister   . Hypertension Sister   . Heart disease Brother        CAD/MI  . Hypertension Brother   . Coronary artery disease Brother   . Hypertension Unknown        family hx  . Heart attack Brother   . Colon cancer Neg Hx   . Breast cancer Neg Hx   . Diabetes Neg Hx   . Stroke Neg Hx    Social History:  reports that she quit smoking about 61 years ago. She has never used smokeless tobacco. She reports that she does not drink alcohol or use drugs.  Allergies:  Allergies  Allergen Reactions  . Chocolate Hives  . Codeine Other (See Comments)    Patient states she acts crazy  . Diazepam Other (See Comments)    Patient states she acts crazy.  . Fruit & Vegetable Daily [Nutritional Supplements] Hives and Swelling    peaches  . Iohexol Hives     Code: HIVES, Desc: pt gets 13 hr pre-meds, Onset Date: [id number]   . Latex Hives  .  Peach [Prunus Persica] Hives  . Peanut-Containing Drug Products Hives and Swelling  . Penicillins Hives, Itching and Swelling    Has patient had a PCN reaction causing immediate rash, facial/tongue/throat swelling, SOB or lightheadedness with hypotension: Yes Has patient had a PCN reaction causing severe rash involving mucus membranes or skin necrosis: No Has patient had a PCN reaction that required hospitalization No Has patient had a PCN reaction occurring within the last 10 years: No If all of the above answers are "NO", then may proceed with Cephalosporin use.   . Strawberry Extract Hives  . Sulfonamide Derivatives Hives    nausea  . Wheat Shortness Of Breath    Shortness of breath  . Wheat Bran Shortness Of Breath  . Ambien [Zolpidem Tartrate] Other (See Comments)    hallucinations  . Sulfamethoxazole Hives  . Crestor  [Rosuvastatin Calcium] Rash  . Iodine Rash   Home Medications: acetaminophen (TYLENOL) 500 MG tablet Take 1 tablet (500 mg total) by mouth every 6 (six) hours as needed for mild pain. Rise Patience, MD Not Ordered  ALPRAZolam Duanne Moron) 0.25 MG tablet TAKE ONE TABLET BY MOUTH TWICE DAILY AS NEEDED Rise Patience, MD Reordered  Orderedas:ALPRAZolam Duanne Moron) tablet 0.25 mg - 0.25 mg, Oral, 2 times daily PRN, anxiety, Starting Wed 04/06/17 at 2144  amLODipine (NORVASC) 2.5 MG tablet TAKE ONE TABLET BY MOUTH ONCE DAILY Rise Patience, MD Reordered  Orderedas:amLODipine [facility]) tablet 2.5 mg - 2.5 mg, Oral, Daily, First dose on Wed 04/06/17 at 2200  atorvastatin (LIPITOR) 10 MG tablet TAKE ONE TABLET BY MOUTH ONCE DAILY AT 6 PM Rise Patience, MD Reordered  Orderedas:atorvastatin (LIPITOR) tablet 10 mg - 10 mg, Oral, Daily-1800, First dose on Thu 04/07/17 at 1800  diltiazem (DILACOR XR) 180 MG 24 hr capsule TAKE ONE CAPSULE BY MOUTH ONCE DAILY Rise Patience, MD Reordered  Orderedas:diltiazem (DILACOR XR) 24 hr capsule 180 mg - 180 mg, Oral, Daily, First dose on Wed 04/06/17 at 2200  ELIQUIS 2.5 MG TABS tablet TAKE ONE TABLET BY MOUTH TWICE DAILY Rise Patience, MD Reordered  Orderedas:apixaban Arne Cleveland) tablet 2.5 mg - 2.5 mg, Oral, 2 times daily, First dose on Wed 04/06/17 at 2200  hydrocortisone (ANUSOL-HC) 25 MG suppository Place 1 suppository (25 mg total) rectally every 12 (twelve) hours. Rise Patience, MD Not Ordered  levETIRAcetam (KEPPRA) 250 MG tablet TAKE ONE TABLET BY MOUTH TWICE DAILY Rise Patience, MD Reordered  Orderedas:levETIRAcetam [facility]) tablet 250 mg - 250 mg, Oral, 2 times daily, First dose on Wed 04/06/17 at 2200  sodium chloride (OCEAN) 0.65 % SOLN nasal spray Place 1 spray into both nostrils as needed for congestion. Rise Patience, MD Reordered  Orderedas:sodium chloride (OCEAN) 0.65 % nasal spray 1 spray - 1  spray, Each Nare, As needed, congestion, Starting Wed 04/06/17 at 2142  traMADol (ULTRAM) 50 MG tablet Take 1-2 tablets (50-100 mg total) by mouth every 6 (six) hours as needed for moderate pain or severe pain. Rise Patience, MD Not Ordered  traZODone (DESYREL) 50 MG tablet Take 0.5-1 tablets (25-50 mg total) by mouth at bedtime as needed for sleep. Rise Patience, MD Reordered  Orderedas:traZODone [facility]) tablet 25-50 mg - 25-50 mg, Oral, At bedtime PRN, sleep, Starting Wed 04/06/17 at 2148  nitrofurantoin (MACRODANTIN) 50 MG capsule Take 1 capsule (50 mg total) by mouth 2 (two) times daily. Rise Patience, MD Not Ordered  polyethylene glycol (MIRALAX / GLYCOLAX) packet Take 17 g by  mouth daily. Rise Patience, MD Not Ordered   Patient not taking: Reported on 04/06/2017    senna-docusate (SENOKOT-S) 8.6-50 MG tablet Take 1 tablet by mouth 2 (two) times daily. Rise Patience, MD Not Ordered   Patient not taking: Reported on 04/06/2017       ROS: No vision loss, focal weakness, confusion, dysphasia or sensory numbness.   Physical Examination: Blood pressure (!) 128/59, pulse (!) 106, temperature (!) 102 F (38.9 C), temperature source Oral, resp. rate (!) 23, height 5' 7"  (1.702 m), weight 52.6 kg (116 lb), SpO2 92 %.  HEENT-  [place]/AT. Neck is supple.  Lungs - Respirations unlabored Extremities - Mild pretibial edema  Neurologic Examination: Mental Status: Awake and alert. Fully oriented. Speech fluent. Naming, comprehension and repetition are intact.  Cranial Nerves: II:  Visual fields intact bilaterally. PERRL.  III,IV, VI: Ptosis not present, EOMI. No nystagmus.  V,VII: smile symmetric, facial temperature sensation normal bilaterally VIII: hearing intact to voice IX,X: No hypophonia XI: Symmetric XII: Midline tongue extension Motor: Right : Upper extremity   5/5    Left:     Upper extremity   5/5  Lower extremity   5/5     Lower extremity   4+/5 in the  context of left hip pain Normal tone throughout; no atrophy noted Sensory: Temperature and light touch intact x 4. No extinction.  Deep Tendon Reflexes:  Normoactive. Cerebellar: No ataxia with FNF bilaterally. Heel-shin normal on right, unable to perform on left due to hip pain. Gait: Deferred  Results for orders placed or performed during the hospital encounter of 04/06/17 (from the past 48 hour(s))  Protime-INR     Status: Abnormal   Collection Time: 04/06/17  7:06 PM  Result Value Ref Range   Prothrombin Time 18.3 (H) 11.4 - 15.2 seconds   INR 1.51   APTT     Status: None   Collection Time: 04/06/17  7:06 PM  Result Value Ref Range   aPTT 34 24 - 36 seconds  CBC     Status: Abnormal   Collection Time: 04/06/17  7:06 PM  Result Value Ref Range   WBC 32.0 (H) 4.0 - 10.5 K/uL    Comment: REPEATED TO VERIFY   RBC 4.13 3.87 - 5.11 MIL/uL   Hemoglobin 12.1 12.0 - 15.0 g/dL   HCT 37.6 36.0 - 46.0 %   MCV 91.0 78.0 - 100.0 fL   MCH 29.3 26.0 - 34.0 pg   MCHC 32.2 30.0 - 36.0 g/dL   RDW 14.6 11.5 - 15.5 %   Platelets 186 150 - 400 K/uL  Differential     Status: Abnormal   Collection Time: 04/06/17  7:06 PM  Result Value Ref Range   Neutrophils Relative % 96 %   Lymphocytes Relative 1 %   Monocytes Relative 3 %   Eosinophils Relative 0 %   Basophils Relative 0 %   Neutro Abs 30.7 (H) 1.7 - 7.7 K/uL   Lymphs Abs 0.3 (L) 0.7 - 4.0 K/uL   Monocytes Absolute 1.0 0.1 - 1.0 K/uL   Eosinophils Absolute 0.0 0.0 - 0.7 K/uL   Basophils Absolute 0.0 0.0 - 0.1 K/uL   Smear Review MORPHOLOGY UNREMARKABLE   Comprehensive metabolic panel     Status: Abnormal   Collection Time: 04/06/17  7:06 PM  Result Value Ref Range   Sodium 135 135 - 145 mmol/L   Potassium 3.8 3.5 - 5.1 mmol/L   Chloride 105 101 -  111 mmol/L   CO2 21 (L) 22 - 32 mmol/L   Glucose, Bld 156 (H) 65 - 99 mg/dL   BUN 18 6 - 20 mg/dL   Creatinine, Ser 1.36 (H) 0.44 - 1.00 mg/dL   Calcium 8.0 (L) 8.9 - 10.3 mg/dL   Total  Protein 5.6 (L) 6.5 - 8.1 g/dL   Albumin 2.8 (L) 3.5 - 5.0 g/dL   AST 58 (H) 15 - 41 U/L   ALT 47 14 - 54 U/L   Alkaline Phosphatase 118 38 - 126 U/L   Total Bilirubin 0.7 0.3 - 1.2 mg/dL   GFR calc non Af Amer 35 (L) >60 mL/min   GFR calc Af Amer 40 (L) >60 mL/min    Comment: (NOTE) The eGFR has been calculated using the CKD EPI equation. This calculation has not been validated in all clinical situations. eGFR's persistently <60 mL/min signify possible Chronic Kidney Disease.    Anion gap 9 5 - 15  CBG monitoring, ED     Status: Abnormal   Collection Time: 04/06/17  7:06 PM  Result Value Ref Range   Glucose-Capillary 163 (H) 65 - 99 mg/dL  Ethanol     Status: None   Collection Time: 04/06/17  7:07 PM  Result Value Ref Range   Alcohol, Ethyl (B) <5 <5 mg/dL    Comment:        LOWEST DETECTABLE LIMIT FOR SERUM ALCOHOL IS 5 mg/dL FOR MEDICAL PURPOSES ONLY   I-stat troponin, ED (not at [facility], [facility])     Status: None   Collection Time: 04/06/17  7:09 PM  Result Value Ref Range   Troponin i, poc 0.04 0.00 - 0.08 ng/mL   Comment 3            Comment: Due to the release kinetics of cTnI, a negative result within the first hours of the onset of symptoms does not rule out myocardial infarction with certainty. If myocardial infarction is still suspected, repeat the test at appropriate intervals.   I-Stat Chem 8, ED  (not at [facility], [facility])     Status: Abnormal   Collection Time: 04/06/17  7:11 PM  Result Value Ref Range   Sodium 138 135 - 145 mmol/L   Potassium 3.8 3.5 - 5.1 mmol/L   Chloride 102 101 - 111 mmol/L   BUN 19 6 - 20 mg/dL   Creatinine, Ser 1.00 0.44 - 1.00 mg/dL   Glucose, Bld 156 (H) 65 - 99 mg/dL   Calcium, Ion 1.01 (L) 1.15 - 1.40 mmol/L   TCO2 22 0 - 100 mmol/L   Hemoglobin 12.9 12.0 - 15.0 g/dL   HCT 38.0 36.0 - 46.0 %  I-Stat CG4 Lactic Acid, ED     Status: Abnormal   Collection Time: 04/06/17  7:33 PM  Result Value Ref Range   Lactic Acid, Venous 3.98 (HH)  0.5 - 1.9 mmol/L   Comment NOTIFIED PHYSICIAN    Ct Head Code Stroke W/o Cm  Result Date: 04/06/2017 CLINICAL DATA:  Code stroke. Unsteady gait. Code stroke. History of hypertension, hyperlipidemia. EXAM: CT HEAD WITHOUT CONTRAST TECHNIQUE: Contiguous axial images were obtained from the base of the skull through the vertex without intravenous contrast. COMPARISON:  CT HEAD November 27, 2016 FINDINGS: BRAIN: No intraparenchymal hemorrhage, mass effect nor midline shift. The ventricles and sulci are normal for age. Patchy supratentorial white matter hypodensities less than expected for patient's age, though non-specific are most compatible with chronic small vessel ischemic disease. RIGHT temporal  lobe encephalomalacia. Old RIGHT caudate head lacunar infarct. No acute large vascular territory infarcts. No abnormal extra-axial fluid collections. Basal cisterns are patent. VASCULAR: Mild calcific atherosclerosis of the carotid siphons. SKULL: No skull fracture. Chronic deformity LEFT mandible condyles. No significant scalp soft tissue swelling. SINUSES/ORBITS: Trace mastoid effusions. Paranasal sinuses are well-aerated. The included ocular globes and orbital contents are non-suspicious. Status post LEFT ocular lens implant. OTHER: None. ASPECTS [facility] Stroke Program Early CT Score) - Ganglionic level infarction (caudate, lentiform nuclei, internal capsule, insula, M1-M3 cortex): 7 - Supraganglionic infarction (M4-M6 cortex): 3 Total score (0-10 with 10 being normal): 10 IMPRESSION: 1. No acute intracranial process. Stable examination including old RIGHT basal ganglia lacunar infarct and RIGHT temporal lobe encephalomalacia. 2. ASPECTS is 10. Critical Value/emergent results were called by telephone at the time of interpretation on 04/06/2017 at 7:25 pm to Dr. Cheral Marker, Neurology , who verbally acknowledged these results. Electronically Signed   By: Elon Alas M.D.   On: 04/06/2017 19:26   Assessment: 83  year old female presenting with transient AMS, fever and leukocytosis.  1. Neurological examination not consistent with stroke. Speech is intact. No lateralized findings except for mild LLE weakness in the context of left hip pain.  2. CT head shows no acute intracranial process. Stable examination including old RIGHT basal ganglia lacunar infarct and RIGHT temporal lobe encephalomalacia. 3. Atrial fibrillation on Xarelto.   Recommendations: 1. Evaluate for possible sepsis.  2. MRI brain when stable.    @Electronically  signed: Dr. Kerney Elbe 04/06/2017, 7:49 PM

## 2017-04-06 NOTE — ED Notes (Signed)
Code Sepsis activated @ 1944 (RN aware)

## 2017-04-06 NOTE — H&P (Signed)
History and Physical    Kayla Kent:[id number] DOB: 03/28/1932 DOA: 04/06/2017  PCP: Biagio Borg, MD  Patient coming from: Home.  Chief Complaint: Fever chills and confusion.  HPI: Kayla Kent is a 83 y.o. female with history of hypertension, atrial fibrillation, seizures was brought to the ER the patient was found to have increasing confusion as noticed by patient's daughter. Patient states she has been treated for UTI last 2 weeks. This morning patient woke up and has been having some vague right-sided chest pain and neck pain. Patient has been to a new physical trainer and had new workouts done which was attributed to that. Late in the evening patient was talking and the phone when patient appeared to be confused. Patient's neighbor checked on her and patient was having fever chills and confusion and patient was brought to the ER.   ED Course: In the ER initially patient was called a code stroke but which was canceled soon after patient was found to be in sepsis. Chest x-ray was showing congestion UA is unremarkable patient was in A. fib with RVR with fever and leukocytosis. Patient was given fluid bolus for sepsis protocol and started on Levaquin. Patient by the time I examined has become alert awake and oriented. Patient's heart rate also improved. Patient is being admitted for sepsis source likely to be UTI.  Review of Systems: As per HPI, rest all negative.   Past Medical History:  Diagnosis Date  . Allergic rhinitis   . Anxiety   . HLD (hyperlipidemia)   . HTN (hypertension)   . Intolerance of drug    orthostatic  . Paroxysmal supraventricular tachycardia ([place])   . PVD (peripheral vascular disease) ([place])   . Syncope and collapse   . Uterine prolapse without mention of vaginal wall prolapse     Past Surgical History:  Procedure Laterality Date  . CARDIOVERSION N/A 11/05/2014   Procedure: CARDIOVERSION;  Surgeon: Candee Furbish, MD;  Location: [facility] ENDOSCOPY;  Service:  Cardiovascular;  Laterality: N/A;  . cataract surgery  08/2015  . CHOLECYSTECTOMY    . corrective eye surgery     as a child  . INTRAMEDULLARY (IM) NAIL INTERTROCHANTERIC Left 11/21/2016   Procedure: INTRAMEDULLARY (IM) NAIL INTERTROCHANTRIC HEMI;  Surgeon: Newt Minion, MD;  Location: [facility];  Service: Orthopedics;  Laterality: Left;  . IVD removed    . OPEN REDUCTION INTERNAL FIXATION (ORIF) DISTAL RADIAL FRACTURE Right 08/29/2014   Procedure: OPEN REDUCTION INTERNAL FIXATION (ORIF) DISTAL RADIAL FRACTURE;  Surgeon: Marianna Payment, MD;  Location: [place];  Service: Orthopedics;  Laterality: Right;  . RF ablation PSVT     summer '10  . stress cardiolite  08/05/93  . TONSILLECTOMY    . TOTAL HIP ARTHROPLASTY Right 08/29/2014   Procedure: Right Hip Hemi Arthroplasty;  Surgeon: Marianna Payment, MD;  Location: [place];  Service: Orthopedics;  Laterality: Right;  Hip procedure 1st wants [employer], [employer], [place].      reports that she quit smoking about 61 years ago. She has never used smokeless tobacco. She reports that she does not drink alcohol or use drugs.  Allergies  Allergen Reactions  . Chocolate Hives  . Codeine Other (See Comments)    Patient states she acts crazy  . Diazepam Other (See Comments)    Patient states she acts crazy.  . Fruit & Vegetable Daily [Nutritional Supplements] Hives and Swelling    peaches  . Iohexol Hives  Code: HIVES, Desc: pt gets 13 hr pre-meds, Onset Date: [id number]   . Latex Hives  . Peach [Prunus Persica] Hives  . Peanut-Containing Drug Products Hives and Swelling  . Penicillins Hives, Itching and Swelling    Has patient had a PCN reaction causing immediate rash, facial/tongue/throat swelling, SOB or lightheadedness with hypotension: Yes Has patient had a PCN reaction causing severe rash involving mucus membranes or skin necrosis: No Has patient had a PCN reaction that required hospitalization No Has patient had a PCN reaction  occurring within the last 10 years: No If all of the above answers are "NO", then may proceed with Cephalosporin use.   . Strawberry Extract Hives  . Sulfonamide Derivatives Hives    nausea  . Wheat Shortness Of Breath    Shortness of breath  . Wheat Bran Shortness Of Breath  . Ambien [Zolpidem Tartrate] Other (See Comments)    hallucinations  . Sulfamethoxazole Hives  . Crestor [Rosuvastatin Calcium] Rash  . Iodine Rash    Family History  Problem Relation Age of Onset  . Mental illness Father        suicide  . Arthritis Father   . Hyperlipidemia Sister   . Hypertension Sister   . Heart disease Brother        CAD/MI  . Hypertension Brother   . Coronary artery disease Brother   . Hypertension Unknown        family hx  . Heart attack Brother   . Colon cancer Neg Hx   . Breast cancer Neg Hx   . Diabetes Neg Hx   . Stroke Neg Hx     Prior to Admission medications   Medication Sig Start Date End Date Taking? Authorizing Provider  acetaminophen (TYLENOL) 500 MG tablet Take 1 tablet (500 mg total) by mouth every 6 (six) hours as needed for mild pain. 11/22/16  Yes Newt Minion, MD  ALPRAZolam Duanne Moron) 0.25 MG tablet TAKE ONE TABLET BY MOUTH TWICE DAILY AS NEEDED 03/22/17  Yes Biagio Borg, MD  amLODipine (NORVASC) 2.5 MG tablet TAKE ONE TABLET BY MOUTH ONCE DAILY 04/01/17  Yes Fay Records, MD  atorvastatin (LIPITOR) 10 MG tablet TAKE ONE TABLET BY MOUTH ONCE DAILY AT 6 PM 09/06/16  Yes Fay Records, MD  diltiazem (DILACOR XR) 180 MG 24 hr capsule TAKE ONE CAPSULE BY MOUTH ONCE DAILY 02/23/17  Yes Biagio Borg, MD  ELIQUIS 2.5 MG TABS tablet TAKE ONE TABLET BY MOUTH TWICE DAILY 10/08/16  Yes Fay Records, MD  hydrocortisone (ANUSOL-HC) 25 MG suppository Place 1 suppository (25 mg total) rectally every 12 (twelve) hours. 03/03/17 03/03/18 Yes Biagio Borg, MD  levETIRAcetam (KEPPRA) 250 MG tablet TAKE ONE TABLET BY MOUTH TWICE DAILY 11/08/16  Yes Biagio Borg, MD  sodium chloride  (OCEAN) 0.65 % SOLN nasal spray Place 1 spray into both nostrils as needed for congestion.   Yes [provider]  traMADol (ULTRAM) 50 MG tablet Take 1-2 tablets (50-100 mg total) by mouth every 6 (six) hours as needed for moderate pain or severe pain. 11/25/16  Yes Rama, Venetia Maxon, MD  traZODone (DESYREL) 50 MG tablet Take 0.5-1 tablets (25-50 mg total) by mouth at bedtime as needed for sleep. 03/03/17  Yes Biagio Borg, MD  nitrofurantoin (MACRODANTIN) 50 MG capsule Take 1 capsule (50 mg total) by mouth 2 (two) times daily. 04/04/17   Biagio Borg, MD  polyethylene glycol [facility] / Floria Raveling) packet Take 17  g by mouth daily. Patient not taking: Reported on 04/06/2017 11/25/16   Rama, Venetia Maxon, MD  senna-docusate (SENOKOT-S) 8.6-50 MG tablet Take 1 tablet by mouth 2 (two) times daily. Patient not taking: Reported on 04/06/2017 11/25/16   Rama, Venetia Maxon, MD    Physical Exam: Vitals:   04/06/17 2031 04/06/17 2045 04/06/17 2100 04/06/17 2115  BP:  (!) 137/58 130/67 114/61  Pulse:   (!) 132 (!) 124  Resp:  (!) 25 (!) 25 (!) 26  Temp: (!) 101.5 F (38.6 C)     TempSrc: Rectal     SpO2:   93% 91%  Weight:      Height:          Constitutional: Moderately built and nourished. Vitals:   04/06/17 2031 04/06/17 2045 04/06/17 2100 04/06/17 2115  BP:  (!) 137/58 130/67 114/61  Pulse:   (!) 132 (!) 124  Resp:  (!) 25 (!) 25 (!) 26  Temp: (!) 101.5 F (38.6 C)     TempSrc: Rectal     SpO2:   93% 91%  Weight:      Height:       Eyes: Anicteric no pallor. ENMT: No discharge from the ears eyes nose or mouth. Neck: No neck rigidity and no mass felt. No JVD appreciated. Respiratory: No rhonchi or crepitations. Cardiovascular: S1-S2 heard no murmurs appreciated. Abdomen: Soft nontender bowel sounds present. Musculoskeletal: No edema. No joint effusion. Skin: No rash. Skin appears warm. Neurologic: Alert awake oriented to time place and person. Moves all extremities. Psychiatric:  Appears normal. Normal affect.   Labs on Admission: I have personally reviewed following labs and imaging studies  CBC:  Recent Labs Lab 04/06/17 1906 04/06/17 1911  WBC 32.0*  --   NEUTROABS 30.7*  --   HGB 12.1 12.9  HCT 37.6 38.0  MCV 91.0  --   PLT 186  --    Basic Metabolic Panel:  Recent Labs Lab 04/06/17 1906 04/06/17 1911  NA 135 138  K 3.8 3.8  CL 105 102  CO2 21*  --   GLUCOSE 156* 156*  BUN 18 19  CREATININE 1.36* 1.00  CALCIUM 8.0*  --    GFR: Estimated Creatinine Clearance: 35.4 mL/min (by C-G formula based on SCr of 1 mg/dL). Liver Function Tests:  Recent Labs Lab 04/06/17 1906  AST 58*  ALT 47  ALKPHOS 118  BILITOT 0.7  PROT 5.6*  ALBUMIN 2.8*   No results for input(s): LIPASE, AMYLASE in the last 168 hours. No results for input(s): AMMONIA in the last 168 hours. Coagulation Profile:  Recent Labs Lab 04/06/17 1906  INR 1.51   Cardiac Enzymes: No results for input(s): CKTOTAL, CKMB, CKMBINDEX, TROPONINI in the last 168 hours. BNP (last 3 results)  Recent Labs  10/22/16 1135  PROBNP 410.0*   HbA1C: No results for input(s): HGBA1C in the last 72 hours. CBG:  Recent Labs Lab 04/06/17 1906  GLUCAP 163*   Lipid Profile: No results for input(s): CHOL, HDL, LDLCALC, TRIG, CHOLHDL, LDLDIRECT in the last 72 hours. Thyroid Function Tests: No results for input(s): TSH, T4TOTAL, FREET4, T3FREE, THYROIDAB in the last 72 hours. Anemia Panel: No results for input(s): VITAMINB12, FOLATE, FERRITIN, TIBC, IRON, RETICCTPCT in the last 72 hours. Urine analysis:    Component Value Date/Time   COLORURINE AMBER (A) 04/06/2017 1944   APPEARANCEUR CLEAR 04/06/2017 1944   APPEARANCEUR Clear 03/23/2017 1045   LABSPEC 1.024 04/06/2017 1944   PHURINE 5.0 04/06/2017 1944  GLUCOSEU NEGATIVE 04/06/2017 1944   GLUCOSEU NEGATIVE 03/30/2017 1010   HGBUR NEGATIVE 04/06/2017 1944   BILIRUBINUR NEGATIVE 04/06/2017 1944   BILIRUBINUR Negative  03/23/2017 Cataract 04/06/2017 1944   PROTEINUR 30 (A) 04/06/2017 1944   UROBILINOGEN 0.2 03/30/2017 1010   NITRITE NEGATIVE 04/06/2017 1944   LEUKOCYTESUR NEGATIVE 04/06/2017 1944   LEUKOCYTESUR Negative 03/23/2017 1045   Sepsis Labs: @LABRCNTIP (procalcitonin:4,lacticidven:4) ) Recent Results (from the past 240 hour(s))  Urine Culture     Status: None   Collection Time: 03/30/17 10:10 AM  Result Value Ref Range Status   Culture ESCHERICHIA COLI  Final   Colony Count 10,000-50,000 CFU/mL  Final   Organism ID, Bacteria ESCHERICHIA COLI  Final      Susceptibility   Escherichia coli -  (no method available)    AMPICILLIN <=2 Sensitive     AMOX/CLAVULANIC <=2 Sensitive     AMPICILLIN/SULBACTAM <=2 Sensitive     PIP/TAZO <=4 Sensitive     IMIPENEM <=0.25 Sensitive     CEFAZOLIN <=4 Not Reportable     CEFTRIAXONE <=1 Sensitive     CEFTAZIDIME <=1 Sensitive     CEFEPIME <=1 Sensitive     GENTAMICIN <=1 Sensitive     TOBRAMYCIN <=1 Sensitive     CIPROFLOXACIN <=0.25 Sensitive     LEVOFLOXACIN <=0.12 Sensitive     NITROFURANTOIN <=16 Sensitive     TRIMETH/SULFA* <=20 Sensitive      * NR=NOT REPORTABLE,SEE COMMENTORAL therapy:A cefazolin MIC of <32 predicts susceptibility to the oral agents cefaclor,cefdinir,cefpodoxime,cefprozil,cefuroxime,cephalexin,and loracarbef when used for therapy of uncomplicated UTIs due to E.coli,K.pneumomiae,and P.mirabilis. PARENTERAL therapy: A cefazolinMIC of >8 indicates resistance to parenteralcefazolin. An alternate test method must beperformed to confirm susceptibility to parenteralcefazolin.     Radiological Exams on Admission: Dg Chest Port 1 View  Result Date: 04/06/2017 CLINICAL DATA:  RIGHT side chest pain, sepsis, UTI, fever, new altered mental status EXAM: PORTABLE CHEST 1 VIEW COMPARISON:  Portable exam 1949 hours compared to 11/20/2016 FINDINGS: Enlargement of cardiac silhouette with pulmonary vascular congestion.  Atherosclerotic calcification aorta. Emphysematous changes with scattered mild interstitial prominence throughout both lungs new since previous exam question mild pulmonary edema. No pleural effusion or pneumothorax. Diffuse osseous demineralization. IMPRESSION: Enlargement of cardiac silhouette with pulmonary vascular congestion and new interstitial infiltrates question pulmonary edema. Aortic Atherosclerosis (ICD10-I70.0) and Emphysema (ICD10-J43.9). Electronically Signed   By: Lavonia Dana M.D.   On: 04/06/2017 20:02   Ct Head Code Stroke W/o Cm  Result Date: 04/06/2017 CLINICAL DATA:  Code stroke. Unsteady gait. Code stroke. History of hypertension, hyperlipidemia. EXAM: CT HEAD WITHOUT CONTRAST TECHNIQUE: Contiguous axial images were obtained from the base of the skull through the vertex without intravenous contrast. COMPARISON:  CT HEAD November 27, 2016 FINDINGS: BRAIN: No intraparenchymal hemorrhage, mass effect nor midline shift. The ventricles and sulci are normal for age. Patchy supratentorial white matter hypodensities less than expected for patient's age, though non-specific are most compatible with chronic small vessel ischemic disease. RIGHT temporal lobe encephalomalacia. Old RIGHT caudate head lacunar infarct. No acute large vascular territory infarcts. No abnormal extra-axial fluid collections. Basal cisterns are patent. VASCULAR: Mild calcific atherosclerosis of the carotid siphons. SKULL: No skull fracture. Chronic deformity LEFT mandible condyles. No significant scalp soft tissue swelling. SINUSES/ORBITS: Trace mastoid effusions. Paranasal sinuses are well-aerated. The included ocular globes and orbital contents are non-suspicious. Status post LEFT ocular lens implant. OTHER: None. ASPECTS [facility] Stroke Program Early CT Score) - Ganglionic level infarction (caudate, lentiform  nuclei, internal capsule, insula, M1-M3 cortex): 7 - Supraganglionic infarction (M4-M6 cortex): 3 Total score (0-10  with 10 being normal): 10 IMPRESSION: 1. No acute intracranial process. Stable examination including old RIGHT basal ganglia lacunar infarct and RIGHT temporal lobe encephalomalacia. 2. ASPECTS is 10. Critical Value/emergent results were called by telephone at the time of interpretation on 04/06/2017 at 7:25 pm to Dr. Cheral Marker, Neurology , who verbally acknowledged these results. Electronically Signed   By: Elon Alas M.D.   On: 04/06/2017 19:26    EKG: Independently reviewed. A. fib with RVR.  Assessment/Plan Principal Problem:   Sepsis ([place]) Active Problems:   Chronic diastolic CHF (congestive heart failure) ([facility])   Seizures ([facility])   Atrial fibrillation with RVR ([facility])    1. Sepsis likely source could be UTI - patient is on Levaquin. Follow blood cultures urine cultures and lactate and procalcitonin levels and continue hydration. 2. Atypical chest pain with chest x-ray showing widened cardiac silhouette - we'll cycle cardiac markers patient is already on Apixaban. Check 2-D echo and I will get a CT of the chest to check for the mediastinum. 3. A. fib with RVR likely precipitated by sepsis - heart rate has improved with fluids. Chads 2 vasc score is more than 2 and patient is on Apixaban and Cardizem which will be continued. 4. Hypertension on amlodipine. 5. History of seizure on Keppra. 6. Chronic kidney disease stage 3 - follow metabolic panel.  I have reviewed patient's old charts and labs. I have discussed with on-call neurologist.   DVT prophylaxis: Apixaban. Code Status: Full code.  Family Communication: Patient's daughter.  Disposition Plan: Home.  Consults called: Discussed with neuro.  Admission status: Inpatient.    Rise Patience MD [facility] Pager [phone].  If 7PM-7AM, please contact night-coverage [email or web address] Password [facility]  04/06/2017, 9:52 PM

## 2017-04-06 NOTE — ED Notes (Signed)
Pt's daughter [name] requesting for any pt's up dates to be given over the phone to pt's son that lives out of town and is an ED Therapist, sports.

## 2017-04-06 NOTE — Progress Notes (Signed)
CRITICAL VALUE ALERT  Critical Value:  Troponin 0.07  Date & Time Notied:  04/06/17 @ 2330  Provider Notified: Hal Hope, A  Orders Received/Actions taken: no new orders given

## 2017-04-07 ENCOUNTER — Inpatient Hospital Stay (HOSPITAL_COMMUNITY): Payer: Medicare Other

## 2017-04-07 DIAGNOSIS — N390 Urinary tract infection, site not specified: Secondary | ICD-10-CM

## 2017-04-07 DIAGNOSIS — G9341 Metabolic encephalopathy: Secondary | ICD-10-CM

## 2017-04-07 DIAGNOSIS — N179 Acute kidney failure, unspecified: Secondary | ICD-10-CM

## 2017-04-07 LAB — CBC WITH DIFFERENTIAL/PLATELET
Basophils Absolute: 0 K/uL (ref 0.0–0.1)
Basophils Relative: 0 %
Eosinophils Absolute: 0.2 K/uL (ref 0.0–0.7)
Eosinophils Relative: 1 %
HCT: 38.5 % (ref 36.0–46.0)
Hemoglobin: 12.3 g/dL (ref 12.0–15.0)
Lymphocytes Relative: 2 %
Lymphs Abs: 0.6 K/uL — ABNORMAL LOW (ref 0.7–4.0)
MCH: 29.2 pg (ref 26.0–34.0)
MCHC: 31.9 g/dL (ref 30.0–36.0)
MCV: 91.4 fL (ref 78.0–100.0)
Monocytes Absolute: 0.6 K/uL (ref 0.1–1.0)
Monocytes Relative: 2 %
Neutro Abs: 23.9 K/uL — ABNORMAL HIGH (ref 1.7–7.7)
Neutrophils Relative %: 95 %
Platelets: 182 K/uL (ref 150–400)
RBC: 4.21 MIL/uL (ref 3.87–5.11)
RDW: 14.9 % (ref 11.5–15.5)
WBC: 25.2 K/uL — ABNORMAL HIGH (ref 4.0–10.5)

## 2017-04-07 LAB — COMPREHENSIVE METABOLIC PANEL
ALK PHOS: 115 U/L (ref 38–126)
ALT: 48 U/L (ref 14–54)
AST: 48 U/L — AB (ref 15–41)
Albumin: 2.5 g/dL — ABNORMAL LOW (ref 3.5–5.0)
Anion gap: 6 (ref 5–15)
BILIRUBIN TOTAL: 1 mg/dL (ref 0.3–1.2)
BUN: 16 mg/dL (ref 6–20)
CALCIUM: 7.8 mg/dL — AB (ref 8.9–10.3)
CO2: 25 mmol/L (ref 22–32)
CREATININE: 1.2 mg/dL — AB (ref 0.44–1.00)
Chloride: 107 mmol/L (ref 101–111)
GFR calc Af Amer: 47 mL/min — ABNORMAL LOW (ref >60)
GFR, EST NON AFRICAN AMERICAN: 41 mL/min — AB (ref >60)
GLUCOSE: 101 mg/dL — AB (ref 65–99)
Potassium: 4.1 mmol/L (ref 3.5–5.1)
Sodium: 138 mmol/L (ref 135–145)
TOTAL PROTEIN: 5.4 g/dL — AB (ref 6.5–8.1)

## 2017-04-07 LAB — TROPONIN I
TROPONIN I: 0.08 ng/mL — AB (ref <0.03)
Troponin I: 0.07 ng/mL (ref <0.03)

## 2017-04-07 LAB — LACTIC ACID, PLASMA
LACTIC ACID, VENOUS: 1.5 mmol/L (ref 0.5–1.9)
Lactic Acid, Venous: 1.5 mmol/L (ref 0.5–1.9)

## 2017-04-07 NOTE — Progress Notes (Signed)
Pt educated about safety and importance of bed alarm during the night however pt refuses to be on bed alarm. Will continue to round on patient.   Rielyn Krupinski, RN    

## 2017-04-07 NOTE — Progress Notes (Addendum)
PROGRESS NOTE                                                                                                                                                                                                             Patient Demographics:    Kayla Kent, is a 83 y.o. female, DOB - Aug 07, 1932, TGG:[id number]  Admit date - 04/06/2017   Admitting Physician Rise Patience, MD  Outpatient Primary MD for the patient is Biagio Borg, MD  LOS - 1  Outpatient Specialists:None  Chief Complaint  Patient presents with  . Code Stroke       Brief Narrative   83 year old female with hypertension, A. fib on anticoagulation, seizure disorder presented to the ED after being found to be increasingly confused by her daughter. He reported vague right-sided chest and neck discomfort on the morning of admission. Later in the evening she was talking on the phone when she appeared confused her neighbor checked on her and found her to have fever with chills and confusion and brought to the ED. She was recently treated with nitrofurantoin for UTI (had burning urinary symptoms; antibiotic initiated on 6/18).   In the ED, She was found to be septic with fever of 102F, tachycardic, tachypneic, soft blood pressure and WBC of 32 K, lactic acid of 3.98, mild acute kidney injury (creatinine 1.36). She also mildly elevated troponin of 0.08. Lactic acid was normal.   Subjective:   Patient denies any abdominal pain, dysuria, nausea, vomiting or chills. She appears well oriented on exam today.   Assessment  & Plan :    Principal Problem:   Sepsis ([place]) Suspect urinary source. UA unremarkable but patient has been on nitrofurantoin since 6/18. Lactic acid normalized. Wbc improving. Continue IV fluids, empiric Levaquin, Tylenol for fever. Follow blood and urine culture.   Active Problems: Acute metabolic encephalopathy Secondary to sepsis. Now  resolved. Head CT on admission was unremarkable for acute findings, shows remote stroke.     Chronic diastolic CHF (congestive heart failure) (HCC) Hypovolemic with mild history of present illness. Monitor with IV fluids. Continue statin.     Seizures ([place]) Continue Keppra    Atrial fibrillation with RVR ([place]) Secondary to sepsis. Rate now controlled. Continue Cardizem and eliquis. Follows with Dr Harrington Challenger.  Essential hypertension Continue amlodipine and Cardizem.  Acute kidney  injury Likely prerenal with sepsis. Monitor with gentle hydration.  Elevated troponin Suspected demand ischemia with sepsis. No EKG changes or chest pain symptoms  Atypical chest pain with widened cardiac silhouette on chest x-ray Troponin peaked at 0.08. Order 2-D echo and CT of the chest 2 evaluate the mediastinum.   Code Status : Full code  Family Communication  : None at bedside  Disposition Plan  : Home in 1-2 days if remains afebrile and cultures negative  Barriers For Discharge : Active symptoms  Consults  : None  Procedures  : CT head  DVT Prophylaxis  :  eliquis  Lab Results  Component Value Date   PLT 182 04/07/2017    Antibiotics  :   Anti-infectives    Start     Dose/Rate Route Frequency Ordered Stop   04/06/17 2030  levofloxacin (LEVAQUIN) IVPB 500 mg     500 mg 100 mL/hr over 60 Minutes Intravenous Every 24 hours 04/06/17 1955     04/06/17 1945  levofloxacin (LEVAQUIN) IVPB 750 mg  Status:  Discontinued     750 mg 100 mL/hr over 90 Minutes Intravenous  Once 04/06/17 1942 04/06/17 1955        Objective:   Vitals:   04/06/17 2248 04/06/17 2319 04/07/17 0559 04/07/17 0600  BP:  106/77 (!) 111/46   Pulse:  100 99   Resp:  20 20   Temp: 99 F (37.2 C) 97.8 F (36.6 C) 97.8 F (36.6 C)   TempSrc: Oral Oral Oral   SpO2:  94% 90% 96%  Weight:  54.9 kg (121 lb 1.6 oz)    Height:  5\' 7"  (1.702 m)      Wt Readings from Last 3 Encounters:  04/06/17 54.9 kg (121 lb 1.6  oz)  03/18/17 52.6 kg (116 lb)  03/03/17 52.2 kg (115 lb)     Intake/Output Summary (Last 24 hours) at 04/07/17 1108 Last data filed at 04/07/17 0900  Gross per 24 hour  Intake           4417.5 ml  Output             1150 ml  Net           3267.5 ml     Physical Exam  Gen: not in distress HEENT: moist mucosa, supple neck Chest: clear b/l, no added sounds CVS: S1 and S2 irregular, no murmurs rub or gallop GI: soft, NT, ND, BS+ Musculoskeletal: warm, no edema CNS: Alert and oriented 3, nonfocal    Data Review:    CBC  Recent Labs Lab 04/06/17 1906 04/06/17 1911 04/07/17 0407  WBC 32.0*  --  25.2*  HGB 12.1 12.9 12.3  HCT 37.6 38.0 38.5  PLT 186  --  182  MCV 91.0  --  91.4  MCH 29.3  --  29.2  MCHC 32.2  --  31.9  RDW 14.6  --  14.9  LYMPHSABS 0.3*  --  0.6*  MONOABS 1.0  --  0.6  EOSABS 0.0  --  0.2  BASOSABS 0.0  --  0.0    Chemistries   Recent Labs Lab 04/06/17 1906 04/06/17 1911 04/07/17 0407  NA 135 138 138  K 3.8 3.8 4.1  CL 105 102 107  CO2 21*  --  25  GLUCOSE 156* 156* 101*  BUN 18 19 16   CREATININE 1.36* 1.00 1.20*  CALCIUM 8.0*  --  7.8*  AST 58*  --  48*  ALT 47  --  48  ALKPHOS 118  --  115  BILITOT 0.7  --  1.0   ------------------------------------------------------------------------------------------------------------------ No results for input(s): CHOL, HDL, LDLCALC, TRIG, CHOLHDL, LDLDIRECT in the last 72 hours.  Lab Results  Component Value Date   HGBA1C 5.5 09/23/2014   ------------------------------------------------------------------------------------------------------------------  Recent Labs  04/06/17 2213  TSH 1.110   ------------------------------------------------------------------------------------------------------------------ No results for input(s): VITAMINB12, FOLATE, FERRITIN, TIBC, IRON, RETICCTPCT in the last 72 hours.  Coagulation profile  Recent Labs Lab 04/06/17 1906 04/06/17 2212  INR  1.51 1.59    No results for input(s): DDIMER in the last 72 hours.  Cardiac Enzymes  Recent Labs Lab 04/06/17 2212 04/07/17 0407 04/07/17 0907  TROPONINI 0.07* 0.07* 0.08*   ------------------------------------------------------------------------------------------------------------------    Component Value Date/Time   BNP 221.8 (H) 10/08/2014 1206    Inpatient Medications  Scheduled Meds: . amLODipine  2.5 mg Oral Daily  . apixaban  2.5 mg Oral BID  . atorvastatin  10 mg Oral q1800  . diltiazem  180 mg Oral Daily  . levETIRAcetam  250 mg Oral BID   Continuous Infusions: . sodium chloride 75 mL/hr at 04/07/17 0058  . levofloxacin (LEVAQUIN) IV Stopped (04/06/17 2215)   PRN Meds:.acetaminophen **OR** acetaminophen, ALPRAZolam, ondansetron **OR** ondansetron (ZOFRAN) IV, sodium chloride, traZODone  Micro Results Recent Results (from the past 240 hour(s))  Urine Culture     Status: None   Collection Time: 03/30/17 10:10 AM  Result Value Ref Range Status   Culture ESCHERICHIA COLI  Final   Colony Count 10,000-50,000 CFU/mL  Final   Organism ID, Bacteria ESCHERICHIA COLI  Final      Susceptibility   Escherichia coli -  (no method available)    AMPICILLIN <=2 Sensitive     AMOX/CLAVULANIC <=2 Sensitive     AMPICILLIN/SULBACTAM <=2 Sensitive     PIP/TAZO <=4 Sensitive     IMIPENEM <=0.25 Sensitive     CEFAZOLIN <=4 Not Reportable     CEFTRIAXONE <=1 Sensitive     CEFTAZIDIME <=1 Sensitive     CEFEPIME <=1 Sensitive     GENTAMICIN <=1 Sensitive     TOBRAMYCIN <=1 Sensitive     CIPROFLOXACIN <=0.25 Sensitive     LEVOFLOXACIN <=0.12 Sensitive     NITROFURANTOIN <=16 Sensitive     TRIMETH/SULFA* <=20 Sensitive      * NR=NOT REPORTABLE,SEE COMMENTORAL therapy:A cefazolin MIC of <32 predicts susceptibility to the oral agents cefaclor,cefdinir,cefpodoxime,cefprozil,cefuroxime,cephalexin,and loracarbef when used for therapy of uncomplicated UTIs due to  E.coli,K.pneumomiae,and P.mirabilis. PARENTERAL therapy: A cefazolinMIC of >8 indicates resistance to parenteralcefazolin. An alternate test method must beperformed to confirm susceptibility to parenteralcefazolin.    Radiology Reports Dg Chest Port 1 View  Result Date: 04/06/2017 CLINICAL DATA:  RIGHT side chest pain, sepsis, UTI, fever, new altered mental status EXAM: PORTABLE CHEST 1 VIEW COMPARISON:  Portable exam 1949 hours compared to 11/20/2016 FINDINGS: Enlargement of cardiac silhouette with pulmonary vascular congestion. Atherosclerotic calcification aorta. Emphysematous changes with scattered mild interstitial prominence throughout both lungs new since previous exam question mild pulmonary edema. No pleural effusion or pneumothorax. Diffuse osseous demineralization. IMPRESSION: Enlargement of cardiac silhouette with pulmonary vascular congestion and new interstitial infiltrates question pulmonary edema. Aortic Atherosclerosis (ICD10-I70.0) and Emphysema (ICD10-J43.9). Electronically Signed   By: Lavonia Dana M.D.   On: 04/06/2017 20:02   Ct Head Code Stroke W/o Cm  Result Date: 04/06/2017 CLINICAL DATA:  Code stroke. Unsteady gait. Code stroke. History of hypertension, hyperlipidemia. EXAM: CT HEAD WITHOUT CONTRAST TECHNIQUE: Contiguous axial  images were obtained from the base of the skull through the vertex without intravenous contrast. COMPARISON:  CT HEAD November 27, 2016 FINDINGS: BRAIN: No intraparenchymal hemorrhage, mass effect nor midline shift. The ventricles and sulci are normal for age. Patchy supratentorial white matter hypodensities less than expected for patient's age, though non-specific are most compatible with chronic small vessel ischemic disease. RIGHT temporal lobe encephalomalacia. Old RIGHT caudate head lacunar infarct. No acute large vascular territory infarcts. No abnormal extra-axial fluid collections. Basal cisterns are patent. VASCULAR: Mild calcific atherosclerosis of  the carotid siphons. SKULL: No skull fracture. Chronic deformity LEFT mandible condyles. No significant scalp soft tissue swelling. SINUSES/ORBITS: Trace mastoid effusions. Paranasal sinuses are well-aerated. The included ocular globes and orbital contents are non-suspicious. Status post LEFT ocular lens implant. OTHER: None. ASPECTS [facility] Stroke Program Early CT Score) - Ganglionic level infarction (caudate, lentiform nuclei, internal capsule, insula, M1-M3 cortex): 7 - Supraganglionic infarction (M4-M6 cortex): 3 Total score (0-10 with 10 being normal): 10 IMPRESSION: 1. No acute intracranial process. Stable examination including old RIGHT basal ganglia lacunar infarct and RIGHT temporal lobe encephalomalacia. 2. ASPECTS is 10. Critical Value/emergent results were called by telephone at the time of interpretation on 04/06/2017 at 7:25 pm to Dr. Cheral Marker, Neurology , who verbally acknowledged these results. Electronically Signed   By: Elon Alas M.D.   On: 04/06/2017 19:26    Time Spent in minutes  35   Louellen Molder M.D on 04/07/2017 at 11:08 AM  Between 7am to 7pm - Pager - [phone]  After 7pm go to [email or web address] - password [facility]  [facility] -  Office  [phone]

## 2017-04-07 NOTE — Consult Note (Signed)
   [facility] CM Inpatient Consult   04/07/2017  Kayla Kent 21-Nov-1931 [id number]   Patient was assessed for [place] Management needs for high risk .  Chart review reveals the patient is Kayla Kent is a 83 y.o. female with history of hypertension, atrial fibrillation, seizures was brought to the ER the patient was found to have increasing confusion as noticed by patient's daughter. Patient states she has been treated for UTI last 2 weeks. Patient evaluated for community based chronic disease management services with [place] [facility] Program as a benefit of patient's [employer]. Spoke with patient at bedside to explain [place] Management services.  Patient endorses Dr. Cathlean Cower as her primary care provider. Patient states she lives alone but has good support.  Explained [facility] CM for post hospital follow up.  Consent form signed.    Patient will receive post hospital discharge call and will be evaluated for monthly home visits for assessments and disease process education.  Left contact information and [facility] literature at bedside. Made Inpatient Case Manager aware that [place] Management following. Of note, [facility] Care Management services does not replace or interfere with any services that are arranged by inpatient case management or social work.  For additional questions or referrals please contact:    Natividad Brood, RN BSN [facility] Liaison  [phone] business mobile phone Toll free office [phone]

## 2017-04-07 NOTE — Progress Notes (Signed)
CT chest reviewed. Shows incidental 16 mm RUL nodule suspicious for bronchogenic ca  recommend: F/up CT chest in 3 months vs PET-CT vs tissue sampling. No mediastinal widening.

## 2017-04-08 ENCOUNTER — Encounter (HOSPITAL_COMMUNITY): Payer: Self-pay | Admitting: General Practice

## 2017-04-08 ENCOUNTER — Inpatient Hospital Stay (HOSPITAL_COMMUNITY): Payer: Medicare Other

## 2017-04-08 DIAGNOSIS — R569 Unspecified convulsions: Secondary | ICD-10-CM

## 2017-04-08 DIAGNOSIS — I340 Nonrheumatic mitral (valve) insufficiency: Secondary | ICD-10-CM

## 2017-04-08 DIAGNOSIS — I5032 Chronic diastolic (congestive) heart failure: Secondary | ICD-10-CM

## 2017-04-08 LAB — CBC
HCT: 35 % — ABNORMAL LOW (ref 36.0–46.0)
Hemoglobin: 11.1 g/dL — ABNORMAL LOW (ref 12.0–15.0)
MCH: 29.5 pg (ref 26.0–34.0)
MCHC: 31.7 g/dL (ref 30.0–36.0)
MCV: 93.1 fL (ref 78.0–100.0)
PLATELETS: 174 10*3/uL (ref 150–400)
RBC: 3.76 MIL/uL — ABNORMAL LOW (ref 3.87–5.11)
RDW: 15.6 % — AB (ref 11.5–15.5)
WBC: 8.9 10*3/uL (ref 4.0–10.5)

## 2017-04-08 LAB — URINE CULTURE: CULTURE: NO GROWTH

## 2017-04-08 LAB — BASIC METABOLIC PANEL
ANION GAP: 6 (ref 5–15)
BUN: 19 mg/dL (ref 6–20)
CALCIUM: 7.7 mg/dL — AB (ref 8.9–10.3)
CO2: 23 mmol/L (ref 22–32)
CREATININE: 1.02 mg/dL — AB (ref 0.44–1.00)
Chloride: 108 mmol/L (ref 101–111)
GFR calc Af Amer: 57 mL/min — ABNORMAL LOW (ref >60)
GFR, EST NON AFRICAN AMERICAN: 49 mL/min — AB (ref >60)
Glucose, Bld: 86 mg/dL (ref 65–99)
Potassium: 4.1 mmol/L (ref 3.5–5.1)
Sodium: 137 mmol/L (ref 135–145)

## 2017-04-08 NOTE — Evaluation (Signed)
Physical Therapy Evaluation Patient Details Name: Kayla Kent MRN: [id number] DOB: 1932/07/24 Today's Date: 04/08/2017   History of Present Illness  Pt is an 83 yo female admitted with increased confusion, dx with sepsis suspected from UTI and acute metabolic encephalopathy. PMH significant for hypertension, A. fib on anticoagulation, seizure disorder.   Clinical Impression  Patient evaluated by Physical Therapy with no further acute PT needs identified. All education has been completed and the patient has no further questions. Pt independent with transfers, mod I for ambulation of 400 feet and supervision for ascent/descent of 9 stairs. See below for any follow-up Physical Therapy or equipment needs. PT is signing off. Thank you for this referral.     Follow Up Recommendations No PT follow up    Equipment Recommendations  None recommended by PT    Recommendations for Other Services       Precautions / Restrictions Precautions Precautions: Fall Restrictions Weight Bearing Restrictions: No      Mobility  Bed Mobility               General bed mobility comments: in recliner at entry  Transfers Overall transfer level: Independent                  Ambulation/Gait Ambulation/Gait assistance: Modified independent (Device/Increase time) Ambulation Distance (Feet): 400 Feet Assistive device: Rolling walker (2 wheeled);None Gait Pattern/deviations: WFL(Within Functional Limits) Gait velocity: normal Gait velocity interpretation: at or above normal speed for age/gender General Gait Details: ambulated 350 feet with RW with steady, safe cadence then ambulated 50 feet without AD and although gait speed diminished still ambulated with safe, steady gait  Stairs Stairs: Yes Stairs assistance: Supervision Stair Management: One rail Right;Step to pattern Number of Stairs: 9 General stair comments: safe, steady ascent/descent      Balance Overall balance assessment:  No apparent balance deficits (not formally assessed)                                           Pertinent Vitals/Pain Pain Assessment: No/denies pain  VSS    Home Living Family/patient expects to be discharged to:: Private residence Living Arrangements: Alone Available Help at Discharge: Family;Available PRN/intermittently Type of Home: House Home Access: Stairs to enter Entrance Stairs-Rails: None Entrance Stairs-Number of Steps: 1 Home Layout: One level Home Equipment: Grab bars - tub/shower;Shower seat;Walker - 2 wheels      Prior Function Level of Independence: Independent         Comments: lives alone, was not ambulating with RW currently, goes to exercise 6 days a week,         Extremity/Trunk Assessment   Upper Extremity Assessment Upper Extremity Assessment: Overall WFL for tasks assessed    Lower Extremity Assessment Lower Extremity Assessment: Overall WFL for tasks assessed    Cervical / Trunk Assessment Cervical / Trunk Assessment: Normal  Communication   Communication: No difficulties  Cognition Arousal/Alertness: Awake/alert Behavior During Therapy: WFL for tasks assessed/performed Overall Cognitive Status: Within Functional Limits for tasks assessed                                               Assessment/Plan    PT Assessment Patent does not need any further PT services  PT Goals (Current goals can be found in the Care Plan section)  Acute Rehab PT Goals Patient Stated Goal: go home PT Goal Formulation: With patient     AM-PAC PT "6 Clicks" Daily Activity  Outcome Measure Difficulty turning over in bed (including adjusting bedclothes, sheets and blankets)?: None Difficulty moving from lying on back to sitting on the side of the bed? : None Difficulty sitting down on and standing up from a chair with arms (e.g., wheelchair, bedside commode, etc,.)?: None Help needed moving to and from a bed to  chair (including a wheelchair)?: None Help needed walking in hospital room?: None Help needed climbing 3-5 steps with a railing? : None 6 Click Score: 24    End of Session Equipment Utilized During Treatment: Gait belt Activity Tolerance: Patient tolerated treatment well Patient left: in chair;with call bell/phone within reach;with chair alarm set Nurse Communication: Mobility status PT Visit Diagnosis: History of falling (Z91.81)    Time: 1456-1520 PT Time Calculation (min) (ACUTE ONLY): 24 min   Charges:   PT Evaluation $PT Eval Moderate Complexity: 1 Procedure PT Treatments $Gait Training: 8-22 mins   PT G Codes:        Ruffus Kamaka B. Migdalia Dk PT, DPT Acute Rehabilitation  [phone] Pager [phone]    [place] 04/08/2017, 5:29 PM

## 2017-04-08 NOTE — Progress Notes (Signed)
Patient with no complaints or concerns during 7pm - 7am shift.  Khadim Lundberg, RN 

## 2017-04-08 NOTE — Care Management Note (Signed)
Case Management Note  Patient Details  Name: Kayla Kent MRN: [id number] Date of Birth: [birth year]  Subjective/Objective:  Admitted with Sepsis                 Action/Plan: Patient lives at home alone; PCP is Dr Cathlean Cower; has private insurance with Medicare/ BCBS with prescription drug coverage; pharmacy of choice is [facility]; patient has been going to exercise classes 3 times a week and she has a Physiological scientist; awaiting on physical therapy eval, patient deconditioned and possibly need HHC at discharge; List of [place] agencies given to the daughter ( she has not made a decision on which agency at this time). Patient's daughter talked at great length about patient was sent home too early and she could barely walk.   Expected Discharge Date:    possibly 04/10/2017              Expected Discharge Plan:  [place]  In-House Referral:   [facility]  Discharge planning Services  CM Consult     Choice offered to:  Patient, Adult Children  Status of Service:  In process, will continue to follow  Sherrilyn Rist [phone] 04/08/2017, 2:49 PM

## 2017-04-08 NOTE — Progress Notes (Signed)
  Echocardiogram 2D Echocardiogram has been performed.  Jennette Dubin 04/08/2017, 2:15 PM

## 2017-04-08 NOTE — Progress Notes (Signed)
PROGRESS NOTE                                                                                                                                                                                                             Patient Demographics:    Kayla Kent, is a 83 y.o. female, DOB - July 13, 1932, FIE:[id number]  Admit date - 04/06/2017   Admitting Physician Rise Patience, MD  Outpatient Primary MD for the patient is Biagio Borg, MD  LOS - 2  Outpatient Specialists:None  Chief Complaint  Patient presents with  . Code Stroke       Brief Narrative   83 year old female with hypertension, A. fib on anticoagulation, seizure disorder presented to the ED after being found to be increasingly confused by her daughter. He reported vague right-sided chest and neck discomfort on the morning of admission. Later in the evening she was talking on the phone when she appeared confused her neighbor checked on her and found her to have fever with chills and confusion and brought to the ED. She was recently treated with nitrofurantoin for UTI (had burning urinary symptoms; antibiotic initiated on 6/18).   In the ED, She was found to be septic with fever of 102F, tachycardic, tachypneic, soft blood pressure and WBC of 32 K, lactic acid of 3.98, mild acute kidney injury (creatinine 1.36). She also mildly elevated troponin of 0.08. Lactic acid was normal.   Subjective:   No fever, no cough, report persistent dysuria, aaox3, report lives by her self, still driving (per chart, patient lives with her daughter), report fall at home . On 2liter oxygen currently, denies sob, no edema, ( report not on home o2)   Assessment  & Plan :    Principal Problem:   Sepsis ([place]), presented on admission with fever 102, tachycardia, heart rate 132, lactic acidosis 1.66 and metabolic ehcephalopathy Suspect urinary source. UA unremarkable but patient has  been on nitrofurantoin since 6/18. Lactic acid normalized. Wbc nromalized. Continue IV fluids, empiric Levaquin, Tylenol for fever. Follow blood and urine culture.   Active Problems: Acute metabolic encephalopathy Secondary to sepsis. Now resolved. Head CT on admission was unremarkable for acute findings, shows remote stroke.     Chronic diastolic CHF (congestive heart failure) (H[facility]) Hypovolemic with mild history of present illness. Monitor with IV fluids. Continue statin.  Seizures ([place]) Continue Keppra    Atrial fibrillation with RVR ([place]) Secondary to sepsis. Rate now controlled. Continue Cardizem and eliquis. Follows with Dr Harrington Challenger.  Essential hypertension Continue amlodipine and Cardizem.  Acute kidney injury Likely prerenal with sepsis. Monitor with gentle hydration. Cr improving  Elevated troponin Suspected demand ischemia with sepsis. No EKG changes or chest pain symptoms  Atypical chest pain with widened cardiac silhouette on chest x-ray Troponin peaked at 0.08. Order 2-D echo and CT of the chest 2 evaluate the mediastinum. CT chest reviewed. Shows incidental 16 mm RUL nodule suspicious for bronchogenic ca  recommend: F/up CT chest in 3 months vs PET-CT vs tissue sampling. No mediastinal widening.  Code Status : Full code  Family Communication  : None at bedside  Disposition Plan  : Home , likely on 6/23  Consults  : None  Procedures  : CT head  DVT Prophylaxis  :  eliquis  Lab Results  Component Value Date   PLT 174 04/08/2017    Antibiotics  :   Anti-infectives    Start     Dose/Rate Route Frequency Ordered Stop   04/06/17 2030  levofloxacin (LEVAQUIN) IVPB 500 mg     500 mg 100 mL/hr over 60 Minutes Intravenous Every 24 hours 04/06/17 1955     04/06/17 1945  levofloxacin (LEVAQUIN) IVPB 750 mg  Status:  Discontinued     750 mg 100 mL/hr over 90 Minutes Intravenous  Once 04/06/17 1942 04/06/17 1955        Objective:   Vitals:   04/07/17  2336 04/08/17 0127 04/08/17 0242 04/08/17 0320  BP: (!) 127/47   (!) 113/39  Pulse: 92 94  (!) 116  Resp: 18 18  18   Temp: 98.7 F (37.1 C)   98.4 F (36.9 C)  TempSrc: Oral   Oral  SpO2: 90% 93%  93%  Weight:   56.7 kg (124 lb 14.4 oz)   Height:        Wt Readings from Last 3 Encounters:  04/08/17 56.7 kg (124 lb 14.4 oz)  03/18/17 52.6 kg (116 lb)  03/03/17 52.2 kg (115 lb)     Intake/Output Summary (Last 24 hours) at 04/08/17 0751 Last data filed at 04/08/17 0703  Gross per 24 hour  Intake             2265 ml  Output             1445 ml  Net              820 ml     Physical Exam  Gen: not in distress,  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 HEENT: moist mucosa, supple neck Chest: clear b/l, no added sounds CVS: S1 and S2 irregular, no murmurs rub or gallop GI: soft, NT, ND, BS+ Musculoskeletal: warm, no edema CNS: Alert and oriented 3, nonfocal    Data Review:    CBC  Recent Labs Lab 04/06/17 1906 04/06/17 1911 04/07/17 0407 04/08/17 0445  WBC 32.0*  --  25.2* 8.9  HGB 12.1 12.9 12.3 11.1*  HCT 37.6 38.0 38.5 35.0*  PLT 186  --  182 174  MCV 91.0  --  91.4 93.1  MCH 29.3  --  29.2 29.5  MCHC 32.2  --  31.9 31.7  RDW 14.6  --  14.9 15.6*  LYMPHSABS 0.3*  --  0.6*  --   MONOABS 1.0  --  0.6  --   EOSABS 0.0  --  0.2  --   BASOSABS 0.0  --  0.0  --     Chemistries   Recent Labs Lab 04/06/17 1906 04/06/17 1911 04/07/17 0407 04/08/17 0445  NA 135 138 138 137  K 3.8 3.8 4.1 4.1  CL 105 102 107 108  CO2 21*  --  25 23  GLUCOSE 156* 156* 101* 86  BUN 18 19 16 19   CREATININE 1.36* 1.00 1.20* 1.02*  CALCIUM 8.0*  --  7.8* 7.7*  AST 58*  --  48*  --   ALT 47  --  48  --   ALKPHOS 118  --  115  --   BILITOT 0.7  --  1.0  --    ------------------------------------------------------------------------------------------------------------------ No results for input(s): CHOL, HDL, LDLCALC, TRIG, CHOLHDL, LDLDIRECT in the last 72  hours.  Lab Results  Component Value Date   HGBA1C 5.5 09/23/2014   ------------------------------------------------------------------------------------------------------------------  Recent Labs  04/06/17 2213  TSH 1.110   ------------------------------------------------------------------------------------------------------------------ No results for input(s): VITAMINB12, FOLATE, FERRITIN, TIBC, IRON, RETICCTPCT in the last 72 hours.  Coagulation profile  Recent Labs Lab 04/06/17 1906 04/06/17 2212  INR 1.51 1.59    No results for input(s): DDIMER in the last 72 hours.  Cardiac Enzymes  Recent Labs Lab 04/06/17 2212 04/07/17 0407 04/07/17 0907  TROPONINI 0.07* 0.07* 0.08*   ------------------------------------------------------------------------------------------------------------------    Component Value Date/Time   BNP 221.8 (H) 10/08/2014 1206    Inpatient Medications  Scheduled Meds: . amLODipine  2.5 mg Oral Daily  . apixaban  2.5 mg Oral BID  . atorvastatin  10 mg Oral q1800  . diltiazem  180 mg Oral Daily  . levETIRAcetam  250 mg Oral BID   Continuous Infusions: . levofloxacin (LEVAQUIN) IV Stopped (04/07/17 2053)   PRN Meds:.acetaminophen **OR** acetaminophen, ALPRAZolam, ondansetron **OR** ondansetron (ZOFRAN) IV, sodium chloride, traZODone  Micro Results Recent Results (from the past 240 hour(s))  Urine Culture     Status: None   Collection Time: 03/30/17 10:10 AM  Result Value Ref Range Status   Culture ESCHERICHIA COLI  Final   Colony Count 10,000-50,000 CFU/mL  Final   Organism ID, Bacteria ESCHERICHIA COLI  Final      Susceptibility   Escherichia coli -  (no method available)    AMPICILLIN <=2 Sensitive     AMOX/CLAVULANIC <=2 Sensitive     AMPICILLIN/SULBACTAM <=2 Sensitive     PIP/TAZO <=4 Sensitive     IMIPENEM <=0.25 Sensitive     CEFAZOLIN <=4 Not Reportable     CEFTRIAXONE <=1 Sensitive     CEFTAZIDIME <=1 Sensitive      CEFEPIME <=1 Sensitive     GENTAMICIN <=1 Sensitive     TOBRAMYCIN <=1 Sensitive     CIPROFLOXACIN <=0.25 Sensitive     LEVOFLOXACIN <=0.12 Sensitive     NITROFURANTOIN <=16 Sensitive     TRIMETH/SULFA* <=20 Sensitive      * NR=NOT REPORTABLE,SEE COMMENTORAL therapy:A cefazolin MIC of <32 predicts susceptibility to the oral agents cefaclor,cefdinir,cefpodoxime,cefprozil,cefuroxime,cephalexin,and loracarbef when used for therapy of uncomplicated UTIs due to E.coli,K.pneumomiae,and P.mirabilis. PARENTERAL therapy: A cefazolinMIC of >8 indicates resistance to parenteralcefazolin. An alternate test method must beperformed to confirm susceptibility to parenteralcefazolin.  Urine culture     Status: None   Collection Time: 04/06/17  7:44 PM  Result Value Ref Range Status   Specimen Description URINE, CATHETERIZED  Final   Special Requests NONE  Final   Culture NO GROWTH  Final   Report Status 04/08/2017  FINAL  Final  Blood Culture (routine x 2)     Status: None (Preliminary result)   Collection Time: 04/06/17  8:03 PM  Result Value Ref Range Status   Specimen Description BLOOD LEFT HAND  Final   Special Requests IN PEDIATRIC BOTTLE Blood Culture adequate volume  Final   Culture NO GROWTH < 24 HOURS  Final   Report Status PENDING  Incomplete  Blood Culture (routine x 2)     Status: None (Preliminary result)   Collection Time: 04/06/17  8:07 PM  Result Value Ref Range Status   Specimen Description BLOOD RIGHT ANTECUBITAL  Final   Special Requests   Final    BOTTLES DRAWN AEROBIC AND ANAEROBIC Blood Culture adequate volume   Culture NO GROWTH < 24 HOURS  Final   Report Status PENDING  Incomplete    Radiology Reports Ct Chest Wo Contrast  Result Date: 04/07/2017 CLINICAL DATA:  83 year old female with chest pain. UTI, possible sepsis. EXAM: CT CHEST WITHOUT CONTRAST TECHNIQUE: Multidetector CT imaging of the chest was performed following the standard protocol without IV contrast. COMPARISON:   Portable chest radiograph 04/06/2017. CT Abdomen and Pelvis 09/10/2008. FINDINGS: Cardiovascular: Cardiomegaly.  No pericardial effusion. Vascular patency is not evaluated in the absence of IV contrast. Calcified aortic atherosclerosis. Calcified coronary artery atherosclerosis. Mediastinum/Nodes: Mild mediastinal lymph node enlargement is nonspecific. There does appear to be asymmetric increased right hilar nodal tissue, although this is difficult to measure. Lungs/Pleura: Spiculated 16 mm right upper lobe lung nodule on series 8, image 50. Centrilobular emphysema. Mild generalize pulmonary septal thickening. Small to moderate size bilateral layering pleural effusions with some compressive atelectasis. Major airways remain patent. Atelectasis or scarring in the left costophrenic angle. Upper Abdomen: Negative visualized noncontrast liver, spleen, pancreas, adrenal glands, kidneys, and bowel in the upper abdomen. Surgically absent gallbladder. Musculoskeletal: No acute or suspicious osseous lesion identified. IMPRESSION: 1. Spiculated 16 mm right upper lobe lung nodule highly suspicious for bronchogenic carcinoma. Consider one of the following: (a) repeat chest CT in 3 months, (b) follow-up PET-CT, or (c) tissue sampling. This recommendation follows the consensus statement: Guidelines for Management of Incidental Pulmonary Nodules Detected on CT Images: From the Fleischner Society 2017; Radiology 2017; 284:228-243. 2. Small to moderate layering pleural effusions. Possible mild pulmonary interstitial edema. 3. Cardiomegaly. 4. Aortic Atherosclerosis (ICD10-I70.0) and Emphysema (ICD10-J43.9). Electronically Signed   By: Genevie Ann M.D.   On: 04/07/2017 12:14   Dg Chest Port 1 View  Result Date: 04/06/2017 CLINICAL DATA:  RIGHT side chest pain, sepsis, UTI, fever, new altered mental status EXAM: PORTABLE CHEST 1 VIEW COMPARISON:  Portable exam 1949 hours compared to 11/20/2016 FINDINGS: Enlargement of cardiac  silhouette with pulmonary vascular congestion. Atherosclerotic calcification aorta. Emphysematous changes with scattered mild interstitial prominence throughout both lungs new since previous exam question mild pulmonary edema. No pleural effusion or pneumothorax. Diffuse osseous demineralization. IMPRESSION: Enlargement of cardiac silhouette with pulmonary vascular congestion and new interstitial infiltrates question pulmonary edema. Aortic Atherosclerosis (ICD10-I70.0) and Emphysema (ICD10-J43.9). Electronically Signed   By: Lavonia Dana M.D.   On: 04/06/2017 20:02   Ct Head Code Stroke W/o Cm  Result Date: 04/06/2017 CLINICAL DATA:  Code stroke. Unsteady gait. Code stroke. History of hypertension, hyperlipidemia. EXAM: CT HEAD WITHOUT CONTRAST TECHNIQUE: Contiguous axial images were obtained from the base of the skull through the vertex without intravenous contrast. COMPARISON:  CT HEAD November 27, 2016 FINDINGS: BRAIN: No intraparenchymal hemorrhage, mass effect nor midline shift. The ventricles and sulci  are normal for age. Patchy supratentorial white matter hypodensities less than expected for patient's age, though non-specific are most compatible with chronic small vessel ischemic disease. RIGHT temporal lobe encephalomalacia. Old RIGHT caudate head lacunar infarct. No acute large vascular territory infarcts. No abnormal extra-axial fluid collections. Basal cisterns are patent. VASCULAR: Mild calcific atherosclerosis of the carotid siphons. SKULL: No skull fracture. Chronic deformity LEFT mandible condyles. No significant scalp soft tissue swelling. SINUSES/ORBITS: Trace mastoid effusions. Paranasal sinuses are well-aerated. The included ocular globes and orbital contents are non-suspicious. Status post LEFT ocular lens implant. OTHER: None. ASPECTS [facility] Stroke Program Early CT Score) - Ganglionic level infarction (caudate, lentiform nuclei, internal capsule, insula, M1-M3 cortex): 7 - Supraganglionic  infarction (M4-M6 cortex): 3 Total score (0-10 with 10 being normal): 10 IMPRESSION: 1. No acute intracranial process. Stable examination including old RIGHT basal ganglia lacunar infarct and RIGHT temporal lobe encephalomalacia. 2. ASPECTS is 10. Critical Value/emergent results were called by telephone at the time of interpretation on 04/06/2017 at 7:25 pm to Dr. Cheral Marker, Neurology , who verbally acknowledged these results. Electronically Signed   By: Elon Alas M.D.   On: 04/06/2017 19:26    Time Spent in minutes  1   Oleda Borski M.D PhD on 04/08/2017 at 7:51 AM  Between 7am to 7pm - Pager - [phone]  After 7pm go to [email or web address] - password [facility]  [facility] -  Office  [phone]

## 2017-04-09 ENCOUNTER — Inpatient Hospital Stay (HOSPITAL_COMMUNITY): Payer: Medicare Other

## 2017-04-09 DIAGNOSIS — M7989 Other specified soft tissue disorders: Secondary | ICD-10-CM

## 2017-04-09 DIAGNOSIS — N179 Acute kidney failure, unspecified: Secondary | ICD-10-CM

## 2017-04-09 LAB — BASIC METABOLIC PANEL
Anion gap: 6 (ref 5–15)
BUN: 17 mg/dL (ref 6–20)
CHLORIDE: 108 mmol/L (ref 101–111)
CO2: 25 mmol/L (ref 22–32)
CREATININE: 0.97 mg/dL (ref 0.44–1.00)
Calcium: 8.3 mg/dL — ABNORMAL LOW (ref 8.9–10.3)
GFR calc Af Amer: >60 mL/min (ref >60)
GFR calc non Af Amer: 53 mL/min — ABNORMAL LOW (ref >60)
Glucose, Bld: 84 mg/dL (ref 65–99)
Potassium: 4 mmol/L (ref 3.5–5.1)
SODIUM: 139 mmol/L (ref 135–145)

## 2017-04-09 LAB — ECHOCARDIOGRAM COMPLETE
Height: 67 in
WEIGHTICAEL: 1998.4 [oz_av]

## 2017-04-09 LAB — CBC
HCT: 36.5 % (ref 36.0–46.0)
HEMOGLOBIN: 11.5 g/dL — AB (ref 12.0–15.0)
MCH: 28.6 pg (ref 26.0–34.0)
MCHC: 31.5 g/dL (ref 30.0–36.0)
MCV: 90.8 fL (ref 78.0–100.0)
Platelets: 197 10*3/uL (ref 150–400)
RBC: 4.02 MIL/uL (ref 3.87–5.11)
RDW: 15.1 % (ref 11.5–15.5)
WBC: 5.3 10*3/uL (ref 4.0–10.5)

## 2017-04-09 LAB — MAGNESIUM: Magnesium: 2 mg/dL (ref 1.7–2.4)

## 2017-04-09 MED ORDER — LEVOFLOXACIN 500 MG PO TABS: 500.0000 mg | ORAL_TABLET | Freq: Every day | ORAL | 0 refills | Status: AC

## 2017-04-09 MED ORDER — LEVOFLOXACIN 500 MG PO TABS
500.0000 mg | ORAL_TABLET | Freq: Every day | ORAL | Status: AC
  Administered 2017-04-09: 500 mg via ORAL
  Filled 2017-04-09: qty 1

## 2017-04-09 NOTE — Progress Notes (Signed)
VASCULAR LAB PRELIMINARY  PRELIMINARY  PRELIMINARY  PRELIMINARY  Right lower extremity venous duplex completed.    Preliminary report:  There is no DVT or SVT noted in the right lower extremity.  Jamin Panther, RVT 04/09/2017, 10:30 AM

## 2017-04-09 NOTE — Progress Notes (Signed)
Pt OOB to the bathroom with stand by assist, no needs at this time. Ambulated over the chair with Student RN. Chair alarm set, call bell and phone with in reach. No needs at this time.

## 2017-04-09 NOTE — Progress Notes (Signed)
Pt discharge education provided at bedside with pt and pt family. Pt has all belongings. Pt discharged via wheelchair with nurse tech.  Kayla Kent

## 2017-04-09 NOTE — Progress Notes (Signed)
Notified by CCMD that patient HR dropped down to 38 bpm, and went back up. Patient asymptomatic, VS stable.Will continue to monitor.  Grantham Hippert, RN

## 2017-04-09 NOTE — Discharge Summary (Signed)
Discharge Summary  Kayla Kent IOE:[id number] DOB: [birth year]  PCP: Biagio Borg, MD  Admit date: 04/06/2017 Discharge date: 04/09/2017  Time spent: >3mins  Recommendations for Outpatient Follow-up:  1. F/u with PMD within a week  for hospital discharge follow up, repeat cbc/bmp at follow up 2. Follow up with pulmonology for "Spiculated 16 mm right upper lobe lung nodule "  Discharge Diagnoses:  Active Hospital Problems   Diagnosis Date Noted  . Sepsis ([place]) 04/06/2017  . Atrial fibrillation with RVR ([place]) 04/06/2017  . Seizures ([place]) 03/28/2013  . Chronic diastolic CHF (congestive heart failure) ([place]) 07/17/2009    Resolved Hospital Problems   Diagnosis Date Noted Date Resolved  No resolved problems to display.    Discharge Condition: stable  Diet recommendation: heart healthy  Filed Weights   04/06/17 2319 04/08/17 0242 04/09/17 0403  Weight: 54.9 kg (121 lb 1.6 oz) 56.7 kg (124 lb 14.4 oz) 56 kg (123 lb 7.3 oz)    History of present illness:  PCP: Biagio Borg, MD  Patient coming from: Home.  Chief Complaint: Fever chills and confusion.  HPI: Kayla Kent is a 83 y.o. female with history of hypertension, atrial fibrillation, seizures was brought to the ER the patient was found to have increasing confusion as noticed by patient's daughter. Patient states she has been treated for UTI last 2 weeks. This morning patient woke up and has been having some vague right-sided chest pain and neck pain. Patient has been to a new physical trainer and had new workouts done which was attributed to that. Late in the evening patient was talking and the phone when patient appeared to be confused. Patient's neighbor checked on her and patient was having fever chills and confusion and patient was brought to the ER.   ED Course: In the ER initially patient was called a code stroke but which was canceled soon after patient was found to be in sepsis. Chest x-ray was showing  congestion UA is unremarkable patient was in A. fib with RVR with fever and leukocytosis. Patient was given fluid bolus for sepsis protocol and started on Levaquin. Patient by the time I examined has become alert awake and oriented. Patient's heart rate also improved. Patient is being admitted for sepsis source likely to be UTI.   Hospital Course:  Principal Problem:   Sepsis ([place]) Active Problems:   Chronic diastolic CHF (congestive heart failure) (HCC)   Seizures (H[facility])   Atrial fibrillation with RVR (H[facility])   Principal Problem:   Sepsis ([place]), presented on admission with fever 102, tachycardia, heart rate 132, lactic acidosis 1.82 and metabolic ehcephalopathy -Suspect urinary source. UA unremarkable but patient has been on nitrofurantoin since 6/18. - She received iv epirically Levaquin, blood and urine culture no grwoth.  -she has much improved, fever resolved, Lactic acid normalized. Wbc nromalized. She is discharged home with one more day of levaquin to finish total of 5 days treatment for presumed uti.  Active Problems: Acute metabolic encephalopathy -Secondary to sepsis -Head CT on admission was unremarkable for acute findings, shows remote stroke -encephalopathy resolved.    Acute kidney injury -Likely prerenal with sepsis.  -she received hydration and abx for sepsis,  -Cr 1.36 on admission, cr 0.97 at discharge. .   Elevated troponin -Suspected demand ischemia with sepsis. No EKG changes or chest pain symptoms  Atrial fibrillation with RVR ([place]) -she presented with afib/rvr Secondary to sepsis. - Rate now controlled.  -Continue Cardizem and eliquis. Follows with  cardiology Dr Harrington Challenger.    Chronic diastolic CHF (congestive heart failure) ([place]) She is dehydrated initial on presentation, she received ivf, -repeat echo resolve pending at discharge, she is to follow with cardiology  Right lower extremity edema:  Report chronic , intermittent, Venous doppler negative for  DVT. She is already on eliquis for afib   Atypical chest pain with widened cardiac silhouette on chest x-ray --CT chest Shows incidental 16 mm RUL nodule suspicious for bronchogenic ca recommend: F/up CT chest in 3 months vs PET-CT vs tissue sampling. No mediastinal widening. --chest pain free at discharge, she is on room air, no cough -She is advised to follow up with pulmonology   Essential hypertension Continue amlodipine and Cardizem.  h/o  Seizures ([place]) No seizure in the hospital, Continue Keppra       Code Status : Full code  Family Communication  : None at bedside  Disposition Plan  : Home on 6/23  Consults  : None  Procedures  : CT head  DVT Prophylaxis  :  eliquis    Discharge Exam: BP (!) 136/42 (BP Location: Right Arm)   Pulse 72   Temp 98.5 F (36.9 C) (Oral)   Resp 18   Ht 5\' 7"  (1.702 m)   Wt 56 kg (123 lb 7.3 oz)   SpO2 95%   BMI 19.34 kg/m   General: NAD, sitting in chair putting make up on Cardiovascular: IRRR, no murmur Respiratory: CTABL Extremity: right lower extremity pitting edema, nontender, no erythema Neuro: aaox3, no focal deficit  Discharge Instructions You were cared for by a hospitalist during your hospital stay. If you have any questions about your discharge medications or the care you received while you were in the hospital after you are discharged, you can call the unit and asked to speak with the hospitalist on call if the hospitalist that took care of you is not available. Once you are discharged, your primary care physician will handle any further medical issues. Please note that NO REFILLS for any discharge medications will be authorized once you are discharged, as it is imperative that you return to your primary care physician (or establish a relationship with a primary care physician if you do not have one) for your aftercare needs so that they can reassess your need for medications and monitor your lab  values.  Discharge Instructions    AMB Referral to [facility]    Complete by:  As directed    Please assign to community nurse for transition of care calls and assess for home visits. Patient on high risk list for re-admission.  Questions please call:   Natividad Brood, RN BSN [facility] Liaison  [phone] business mobile phone Toll free office [phone]   Reason for consult:  Post hospital TOC   Diagnoses of:  Heart Failure   Expected date of contact:  1-3 days (reserved for hospital discharges)   Diet - low sodium heart healthy    Complete by:  As directed    Increase activity slowly    Complete by:  As directed      Allergies as of 04/09/2017      Reactions   Chocolate Hives   Codeine Other (See Comments)   Patient states she acts crazy   Diazepam Other (See Comments)   Patient states she acts crazy.   Fruit & Vegetable Daily [nutritional Supplements] Hives, Swelling   peaches   Iohexol Hives    Code: HIVES, Desc:  pt gets 13 hr pre-meds, Onset Date: [id number]   Latex Hives   Peach [prunus Persica] Hives   Peanut-containing Drug Products Hives, Swelling   Penicillins Hives, Itching, Swelling   Has patient had a PCN reaction causing immediate rash, facial/tongue/throat swelling, SOB or lightheadedness with hypotension: Yes Has patient had a PCN reaction causing severe rash involving mucus membranes or skin necrosis: No Has patient had a PCN reaction that required hospitalization No Has patient had a PCN reaction occurring within the last 10 years: No If all of the above answers are "NO", then may proceed with Cephalosporin use.   Strawberry Extract Hives   Sulfonamide Derivatives Hives   nausea   Wheat Shortness Of Breath   Shortness of breath   Wheat Bran Shortness Of Breath   Ambien [zolpidem Tartrate] Other (See Comments)   hallucinations   Sulfamethoxazole Hives   Crestor [rosuvastatin Calcium] Rash   Iodine Rash       Medication List    STOP taking these medications   nitrofurantoin 50 MG capsule Commonly known as:  MACRODANTIN     TAKE these medications   acetaminophen 500 MG tablet Commonly known as:  TYLENOL Take 1 tablet (500 mg total) by mouth every 6 (six) hours as needed for mild pain.   ALPRAZolam 0.25 MG tablet Commonly known as:  XANAX TAKE ONE TABLET BY MOUTH TWICE DAILY AS NEEDED   amLODipine 2.5 MG tablet Commonly known as:  NORVASC TAKE ONE TABLET BY MOUTH ONCE DAILY   atorvastatin 10 MG tablet Commonly known as:  LIPITOR TAKE ONE TABLET BY MOUTH ONCE DAILY AT 6 PM   diltiazem 180 MG 24 hr capsule Commonly known as:  DILACOR XR TAKE ONE CAPSULE BY MOUTH ONCE DAILY   ELIQUIS 2.5 MG Tabs tablet Generic drug:  apixaban TAKE ONE TABLET BY MOUTH TWICE DAILY   hydrocortisone 25 MG suppository Commonly known as:  ANUSOL-HC Place 1 suppository (25 mg total) rectally every 12 (twelve) hours.   levETIRAcetam 250 MG tablet Commonly known as:  KEPPRA TAKE ONE TABLET BY MOUTH TWICE DAILY   levofloxacin 500 MG tablet Commonly known as:  LEVAQUIN Take 1 tablet (500 mg total) by mouth daily.   polyethylene glycol packet Commonly known as:  MIRALAX / GLYCOLAX Take 17 g by mouth daily.   senna-docusate 8.6-50 MG tablet Commonly known as:  Senokot-S Take 1 tablet by mouth 2 (two) times daily.   sodium chloride 0.65 % Soln nasal spray Commonly known as:  OCEAN Place 1 spray into both nostrils as needed for congestion.   traMADol 50 MG tablet Commonly known as:  ULTRAM Take 1-2 tablets (50-100 mg total) by mouth every 6 (six) hours as needed for moderate pain or severe pain.   traZODone 50 MG tablet Commonly known as:  DESYREL Take 0.5-1 tablets (25-50 mg total) by mouth at bedtime as needed for sleep.      Allergies  Allergen Reactions  . Chocolate Hives  . Codeine Other (See Comments)    Patient states she acts crazy  . Diazepam Other (See Comments)    Patient  states she acts crazy.  . Fruit & Vegetable Daily [Nutritional Supplements] Hives and Swelling    peaches  . Iohexol Hives     Code: HIVES, Desc: pt gets 13 hr pre-meds, Onset Date: 27517001   . Latex Hives  . Peach [Prunus Persica] Hives  . Peanut-Containing Drug Products Hives and Swelling  . Penicillins Hives, Itching and Swelling  Has patient had a PCN reaction causing immediate rash, facial/tongue/throat swelling, SOB or lightheadedness with hypotension: Yes Has patient had a PCN reaction causing severe rash involving mucus membranes or skin necrosis: No Has patient had a PCN reaction that required hospitalization No Has patient had a PCN reaction occurring within the last 10 years: No If all of the above answers are "NO", then may proceed with Cephalosporin use.   . Strawberry Extract Hives  . Sulfonamide Derivatives Hives    nausea  . Wheat Shortness Of Breath    Shortness of breath  . Wheat Bran Shortness Of Breath  . Ambien [Zolpidem Tartrate] Other (See Comments)    hallucinations  . Sulfamethoxazole Hives  . Crestor [Rosuvastatin Calcium] Rash  . Iodine Rash   Follow-up Information    Biagio Borg, MD Follow up in 1 week(s).   Specialties:  Internal Medicine, Radiology Why:  hospital discahrge follow up, repat cbc/bmp at follow up Contact information: [place] [address] [phone]        [facility] Follow up in 3 week(s).   Specialty:  Pulmonology Why:  Spiculated 16 mm right upper lobe lung nodule   sampling. Contact information: [place]       Fay Records, MD Follow up.   Specialty:  Cardiology Why:  h/o afib Contact information: [place] [address] [phone]            The results of significant diagnostics from this hospitalization (including imaging, microbiology, ancillary and laboratory) are listed  below for reference.    Significant Diagnostic Studies: Ct Chest Wo Contrast  Result Date: 04/07/2017 CLINICAL DATA:  83 year old female with chest pain. UTI, possible sepsis. EXAM: CT CHEST WITHOUT CONTRAST TECHNIQUE: Multidetector CT imaging of the chest was performed following the standard protocol without IV contrast. COMPARISON:  Portable chest radiograph 04/06/2017. CT Abdomen and Pelvis 09/10/2008. FINDINGS: Cardiovascular: Cardiomegaly.  No pericardial effusion. Vascular patency is not evaluated in the absence of IV contrast. Calcified aortic atherosclerosis. Calcified coronary artery atherosclerosis. Mediastinum/Nodes: Mild mediastinal lymph node enlargement is nonspecific. There does appear to be asymmetric increased right hilar nodal tissue, although this is difficult to measure. Lungs/Pleura: Spiculated 16 mm right upper lobe lung nodule on series 8, image 50. Centrilobular emphysema. Mild generalize pulmonary septal thickening. Small to moderate size bilateral layering pleural effusions with some compressive atelectasis. Major airways remain patent. Atelectasis or scarring in the left costophrenic angle. Upper Abdomen: Negative visualized noncontrast liver, spleen, pancreas, adrenal glands, kidneys, and bowel in the upper abdomen. Surgically absent gallbladder. Musculoskeletal: No acute or suspicious osseous lesion identified. IMPRESSION: 1. Spiculated 16 mm right upper lobe lung nodule highly suspicious for bronchogenic carcinoma. Consider one of the following: (a) repeat chest CT in 3 months, (b) follow-up PET-CT, or (c) tissue sampling. This recommendation follows the consensus statement: Guidelines for Management of Incidental Pulmonary Nodules Detected on CT Images: From the Fleischner Society 2017; Radiology 2017; 284:228-243. 2. Small to moderate layering pleural effusions. Possible mild pulmonary interstitial edema. 3. Cardiomegaly. 4. Aortic Atherosclerosis (ICD10-I70.0) and Emphysema  (ICD10-J43.9). Electronically Signed   By: Genevie Ann M.D.   On: 04/07/2017 12:14   Dg Chest Port 1 View  Result Date: 04/06/2017 CLINICAL DATA:  RIGHT side chest pain, sepsis, UTI, fever, new altered mental status EXAM: PORTABLE CHEST 1 VIEW COMPARISON:  Portable exam 1949 hours compared to 11/20/2016 FINDINGS: Enlargement  of cardiac silhouette with pulmonary vascular congestion. Atherosclerotic calcification aorta. Emphysematous changes with scattered mild interstitial prominence throughout both lungs new since previous exam question mild pulmonary edema. No pleural effusion or pneumothorax. Diffuse osseous demineralization. IMPRESSION: Enlargement of cardiac silhouette with pulmonary vascular congestion and new interstitial infiltrates question pulmonary edema. Aortic Atherosclerosis (ICD10-I70.0) and Emphysema (ICD10-J43.9). Electronically Signed   By: Lavonia Dana M.D.   On: 04/06/2017 20:02   Ct Head Code Stroke W/o Cm  Result Date: 04/06/2017 CLINICAL DATA:  Code stroke. Unsteady gait. Code stroke. History of hypertension, hyperlipidemia. EXAM: CT HEAD WITHOUT CONTRAST TECHNIQUE: Contiguous axial images were obtained from the base of the skull through the vertex without intravenous contrast. COMPARISON:  CT HEAD November 27, 2016 FINDINGS: BRAIN: No intraparenchymal hemorrhage, mass effect nor midline shift. The ventricles and sulci are normal for age. Patchy supratentorial white matter hypodensities less than expected for patient's age, though non-specific are most compatible with chronic small vessel ischemic disease. RIGHT temporal lobe encephalomalacia. Old RIGHT caudate head lacunar infarct. No acute large vascular territory infarcts. No abnormal extra-axial fluid collections. Basal cisterns are patent. VASCULAR: Mild calcific atherosclerosis of the carotid siphons. SKULL: No skull fracture. Chronic deformity LEFT mandible condyles. No significant scalp soft tissue swelling. SINUSES/ORBITS: Trace  mastoid effusions. Paranasal sinuses are well-aerated. The included ocular globes and orbital contents are non-suspicious. Status post LEFT ocular lens implant. OTHER: None. ASPECTS [facility] Stroke Program Early CT Score) - Ganglionic level infarction (caudate, lentiform nuclei, internal capsule, insula, M1-M3 cortex): 7 - Supraganglionic infarction (M4-M6 cortex): 3 Total score (0-10 with 10 being normal): 10 IMPRESSION: 1. No acute intracranial process. Stable examination including old RIGHT basal ganglia lacunar infarct and RIGHT temporal lobe encephalomalacia. 2. ASPECTS is 10. Critical Value/emergent results were called by telephone at the time of interpretation on 04/06/2017 at 7:25 pm to Dr. Cheral Marker, Neurology , who verbally acknowledged these results. Electronically Signed   By: Elon Alas M.D.   On: 04/06/2017 19:26    Microbiology: Recent Results (from the past 240 hour(s))  Urine culture     Status: None   Collection Time: 04/06/17  7:44 PM  Result Value Ref Range Status   Specimen Description URINE, CATHETERIZED  Final   Special Requests NONE  Final   Culture NO GROWTH  Final   Report Status 04/08/2017 FINAL  Final  Blood Culture (routine x 2)     Status: None (Preliminary result)   Collection Time: 04/06/17  8:03 PM  Result Value Ref Range Status   Specimen Description BLOOD LEFT HAND  Final   Special Requests IN PEDIATRIC BOTTLE Blood Culture adequate volume  Final   Culture NO GROWTH 3 DAYS  Final   Report Status PENDING  Incomplete  Blood Culture (routine x 2)     Status: None (Preliminary result)   Collection Time: 04/06/17  8:07 PM  Result Value Ref Range Status   Specimen Description BLOOD RIGHT ANTECUBITAL  Final   Special Requests   Final    BOTTLES DRAWN AEROBIC AND ANAEROBIC Blood Culture adequate volume   Culture NO GROWTH 3 DAYS  Final   Report Status PENDING  Incomplete     Labs: Basic Metabolic Panel:  Recent Labs Lab 04/06/17 1906 04/06/17 1911  04/07/17 0407 04/08/17 0445 04/09/17 0238  NA 135 138 138 137 139  K 3.8 3.8 4.1 4.1 4.0  CL 105 102 107 108 108  CO2 21*  --  25 23 25   GLUCOSE 156* 156* 101*  86 84  BUN 18 19 16 19 17   CREATININE 1.36* 1.00 1.20* 1.02* 0.97  CALCIUM 8.0*  --  7.8* 7.7* 8.3*  MG  --   --   --   --  2.0   Liver Function Tests:  Recent Labs Lab 04/06/17 1906 04/07/17 0407  AST 58* 48*  ALT 47 48  ALKPHOS 118 115  BILITOT 0.7 1.0  PROT 5.6* 5.4*  ALBUMIN 2.8* 2.5*   No results for input(s): LIPASE, AMYLASE in the last 168 hours. No results for input(s): AMMONIA in the last 168 hours. CBC:  Recent Labs Lab 04/06/17 1906 04/06/17 1911 04/07/17 0407 04/08/17 0445 04/09/17 0238  WBC 32.0*  --  25.2* 8.9 5.3  NEUTROABS 30.7*  --  23.9*  --   --   HGB 12.1 12.9 12.3 11.1* 11.5*  HCT 37.6 38.0 38.5 35.0* 36.5  MCV 91.0  --  91.4 93.1 90.8  PLT 186  --  182 174 197   Cardiac Enzymes:  Recent Labs Lab 04/06/17 2212 04/07/17 0407 04/07/17 0907  TROPONINI 0.07* 0.07* 0.08*   BNP: BNP (last 3 results) No results for input(s): BNP in the last 8760 hours.  ProBNP (last 3 results)  Recent Labs  10/22/16 1135  PROBNP 410.0*    CBG:  Recent Labs Lab 04/06/17 1906  GLUCAP 163*       SignedFlorencia Reasons MD, PhD  [facility] 04/09/2017, 12:51 PM

## 2017-04-11 ENCOUNTER — Telehealth: Payer: Self-pay | Admitting: Internal Medicine

## 2017-04-11 ENCOUNTER — Telehealth: Payer: Self-pay | Admitting: *Deleted

## 2017-04-11 ENCOUNTER — Other Ambulatory Visit: Payer: Self-pay

## 2017-04-11 LAB — CULTURE, BLOOD (ROUTINE X 2)
CULTURE: NO GROWTH
Culture: NO GROWTH
SPECIAL REQUESTS: ADEQUATE
Special Requests: ADEQUATE

## 2017-04-11 NOTE — Patient Outreach (Signed)
[facility]) Care Management  04/11/17  Kayla Kent 1932-04-10 [id number]  Successful outreach completed with patient for transition of care. Patient identification verified. Patient was agreeable to telephonic outreach but declines home visit, stating that she does not feel she needs a home visit. She stated that her phone has been ringing off the hook since she was discharged, but she is ok.  Patient stated that she is doing very well since discharge home. She stated that she was discharged home on Saturday, 04/09/2017 and went to church with her daughter on Sunday 6/24. She stated that today she went out and drove to get lunch and to see her personal trainer.   She stated that she was driving on her own with no problems. She also stated that she is going to back to personal trainer as of today. She stated that sees her personal trainer twice a week x 30 minutes each time to work on strengthening. She does not have any trouble with steps, but only has 1 STE home in once entrance and 2 STE where her garbage cans are.  Patient stated she does have advance directives and RNCM encouraged her to take them with her to her PCP visit tomorrow.  She has a follow up appointment for hospital follow up scheduled for tomorrow at 5:30 pm.  She has all of her medicines and stated she puts them in her pillbox.   She is independent with ADLs and IADLS.  She is able to drive herself to appointments, but stated her daughter Kayla Kent is also available if she needs her.  Plan: Will continue weekly calls for TOC. RNCM to complete Risk Assessment and Disease Specific - Other Assessment  [facility] CM Care Plan Problem One     Most Recent Value  Care Plan Problem One  Knowledge deficit related to sepsis as evidenced by development of preventable complications (infection, fever, confusion)  Role Documenting the Problem One  Care Management Coordinator  Care Plan for Problem One  Active  [facility] Long  Term Goal   Patient will not have an inpatient hospital admission within the next 31 days  [facility] Long Term Goal Start Date  04/11/17  Interventions for Problem One Long Term Goal  RNCM to provide education about signs/symptoms to report to PCP, when to call for emergency and what signs/symptoms of infection to monitori for at home,  Patient to attend all follow up appointments as scheduled and take all medications as prescribed  [facility] CM Short Term Goal #1   Patient will attend all follow up appointments within the next 30 days  [facility] CM Short Term Goal #1 Start Date  04/11/17  Interventions for Short Term Goal #1  RNCM verified patient has follow up appointments scheduled and educated patient on importance of attending all follow up appointments.  [facility]N CM Short Term Goal #2   Patient will Identify signs and symptoms requiring medical evaluation such as persistent high fever, increased heart rate, syncope, rashes of unknown origin, unexplained fatigue, anorexia, increased thirst, and changes in bladder function within the next 30 days  [facility] CM Short Term Goal #2 Start Date  04/11/17  Interventions for Short Term Goal #2  RNCM to provide education on the signs and symptoms to watch for at home, what to report to the doctor and when to call for an emergency.       [place] R. Lameshia Hypolite, RN, BSN, [facility] Management Coordinator [phone]

## 2017-04-11 NOTE — Telephone Encounter (Signed)
Close encounter 

## 2017-04-11 NOTE — Telephone Encounter (Signed)
Transition Care Management Follow-up Telephone Call   Date discharged? 04/09/17   How have you been since you were released from the hospital? Pt states she is doing ok   Do you understand why you were in the hospital? YES   Do you understand the discharge instructions? YES   Where were you discharged to? Home   Items Reviewed:  Medications reviewed: YES  Allergies reviewed: YES  Dietary changes reviewed: YES, heart healthy  Referrals reviewed: YES, waiting on call back for appt for pulmonology   Functional Questionnaire:   Activities of Daily Living (ADLs):   She states she are independent in the following: ambulation, bathing and hygiene, feeding, continence, grooming, toileting and dressing States she doesn't require assistance    Any transportation issues/concerns?: NO   Any patient concerns? NO   Confirmed importance and date/time of follow-up visits scheduled YES, pt states she had already made appt for 04/12/17  Provider Appointment booked with Dr. Jenny Reichmann  Confirmed with patient if condition begins to worsen call PCP or go to the ER.  Patient was given the office number and encouraged to call back with question or concerns.  : YES

## 2017-04-11 NOTE — Telephone Encounter (Signed)
Patient has appointment with PCP tomorrow.  I will route to Dr. Harrington Challenger as Juluis Rainier.

## 2017-04-11 NOTE — Telephone Encounter (Signed)
Patient calling states that she went to hospital and stayed for four days due to a UTI. Patient states that before she called 911, her neighbors came to visit and stated that she didn't look well, was stumbling around and not making sense. Patient was told to call and let Dr. Harrington Challenger know about the hospital visit. Patient will leave for exercise around 1:00pm and return home at 2:30pm.

## 2017-04-12 ENCOUNTER — Encounter: Payer: Self-pay | Admitting: Internal Medicine

## 2017-04-12 ENCOUNTER — Ambulatory Visit (INDEPENDENT_AMBULATORY_CARE_PROVIDER_SITE_OTHER): Payer: Medicare Other | Admitting: Internal Medicine

## 2017-04-12 VITALS — BP 170/76 | HR 61 | Ht 67.0 in | Wt 119.0 lb

## 2017-04-12 DIAGNOSIS — R918 Other nonspecific abnormal finding of lung field: Secondary | ICD-10-CM

## 2017-04-12 DIAGNOSIS — I4891 Unspecified atrial fibrillation: Secondary | ICD-10-CM

## 2017-04-12 DIAGNOSIS — N179 Acute kidney failure, unspecified: Secondary | ICD-10-CM | POA: Diagnosis not present

## 2017-04-12 DIAGNOSIS — R911 Solitary pulmonary nodule: Secondary | ICD-10-CM | POA: Insufficient documentation

## 2017-04-12 DIAGNOSIS — N390 Urinary tract infection, site not specified: Secondary | ICD-10-CM

## 2017-04-12 DIAGNOSIS — IMO0001 Reserved for inherently not codable concepts without codable children: Secondary | ICD-10-CM | POA: Insufficient documentation

## 2017-04-12 NOTE — Assessment & Plan Note (Signed)
Clinically improved, for f/u urine studies as she had some faint dysuria yesterday

## 2017-04-12 NOTE — Patient Instructions (Addendum)
Please continue all other medications as before, and refills have been done if requested.  Please have the pharmacy call with any other refills you may need.  Please keep your appointments with your specialists as you may have planned  You will be contacted regarding the referral for: Pulmonary, but you may wish to call and see if you can be seen sooner:      [facility] Follow up in 3 week(s).   Specialty:  Pulmonology Why:  Spiculated 16 mm right upper lobe lung nodule   sampling. Contact information: [place] [phone]   Please go to the LAB in the Basement (turn left off the elevator) for the tests to be done tomorrow  You will be contacted by phone if any changes need to be made immediately.  Otherwise, you will receive a letter about your results with an explanation, but please check with MyChart first.  Please remember to sign up for MyChart if you have not done so, as this will be important to you in the future with finding out test results, communicating by private email, and scheduling acute appointments online when needed.  Please return in 3 months, or sooner if needed

## 2017-04-12 NOTE — Assessment & Plan Note (Signed)
Stable rate and volume, to f/u cardiology as planned

## 2017-04-12 NOTE — Assessment & Plan Note (Signed)
Improved at d/c, for f/u lab as recommended

## 2017-04-12 NOTE — Progress Notes (Signed)
Subjective:    Patient ID: Kayla Kent, female    DOB: 18-Aug-1932, 83 y.o.   MRN: [id number]  HPI  Here to f/u recent hospn 6/20 -6/23 with urosepsis and AMS, after neighbor found her and brought to ED.  Exam was c/w sepsis, UA neg but had been treated recently with antibx, was fever and leukocytosis, as well as afib with RVR.  Tx with IVF and antibx/levaquin with sepsis and AMS resolving.  Multiple urine and blood cultures proved negative. Afib became controlled and cont'd with diltiazem and eliquis. Incidentally found to have CT chest with 16 mm RUL nodule spiculated and high suspicion for new primary bronchogenic carcinoma.  She may have been some confused at d/c, b/c this finding was new to her today, and was not aware she needed to f/u with pulmonary (though this is listed on her d/c summary and presumably on her d/c AVS).  Since home she has finished her antibx and has done reasonably well. Denies urinary symptoms such as dysuria, frequency, urgency, flank pain, hematuria or n/v, fever, chills.  Denies new complaints Past Medical History:  Diagnosis Date  . Allergic rhinitis   . Anxiety   . HLD (hyperlipidemia)   . HTN (hypertension)   . Intolerance of drug    orthostatic  . Paroxysmal supraventricular tachycardia ([place])   . PVD (peripheral vascular disease) ([place])   . Syncope and collapse   . Uterine prolapse without mention of vaginal wall prolapse    Past Surgical History:  Procedure Laterality Date  . CARDIOVERSION N/A 11/05/2014   Procedure: CARDIOVERSION;  Surgeon: Candee Furbish, MD;  Location: [facility] ENDOSCOPY;  Service: Cardiovascular;  Laterality: N/A;  . cataract surgery  08/2015  . CHOLECYSTECTOMY    . corrective eye surgery     as a child  . INTRAMEDULLARY (IM) NAIL INTERTROCHANTERIC Left 11/21/2016   Procedure: INTRAMEDULLARY (IM) NAIL INTERTROCHANTRIC HEMI;  Surgeon: Newt Minion, MD;  Location: [place];  Service: Orthopedics;  Laterality: Left;  . IVD removed    . OPEN  REDUCTION INTERNAL FIXATION (ORIF) DISTAL RADIAL FRACTURE Right 08/29/2014   Procedure: OPEN REDUCTION INTERNAL FIXATION (ORIF) DISTAL RADIAL FRACTURE;  Surgeon: Marianna Payment, MD;  Location: [place];  Service: Orthopedics;  Laterality: Right;  . RF ablation PSVT     summer '10  . stress cardiolite  08/05/93  . TONSILLECTOMY    . TOTAL HIP ARTHROPLASTY Right 08/29/2014   Procedure: Right Hip Hemi Arthroplasty;  Surgeon: Marianna Payment, MD;  Location: [place];  Service: Orthopedics;  Laterality: Right;  Hip procedure 1st wants Peg Board, [employer], [place].     reports that she quit smoking about 61 years ago. She has never used smokeless tobacco. She reports that she does not drink alcohol or use drugs. family history includes Arthritis in her father; Coronary artery disease in her brother; Heart attack in her brother; Heart disease in her brother; Hyperlipidemia in her sister; Hypertension in her brother and sister; Mental illness in her father. Allergies  Allergen Reactions  . Chocolate Hives  . Codeine Other (See Comments)    Patient states she acts crazy  . Diazepam Other (See Comments)    Patient states she acts crazy.  . Fruit & Vegetable Daily [Nutritional Supplements] Hives and Swelling    peaches  . Iohexol Hives     Code: HIVES, Desc: pt gets 13 hr pre-meds, Onset Date: [id number]   . Latex Hives  . Peach [Prunus  Persica] Hives  . Peanut-Containing Drug Products Hives and Swelling  . Penicillins Hives, Itching and Swelling    Has patient had a PCN reaction causing immediate rash, facial/tongue/throat swelling, SOB or lightheadedness with hypotension: Yes Has patient had a PCN reaction causing severe rash involving mucus membranes or skin necrosis: No Has patient had a PCN reaction that required hospitalization No Has patient had a PCN reaction occurring within the last 10 years: No If all of the above answers are "NO", then may proceed with Cephalosporin use.   .  Strawberry Extract Hives  . Sulfonamide Derivatives Hives    nausea  . Wheat Shortness Of Breath    Shortness of breath  . Wheat Bran Shortness Of Breath  . Ambien [Zolpidem Tartrate] Other (See Comments)    hallucinations  . Sulfamethoxazole Hives  . Crestor [Rosuvastatin Calcium] Rash  . Iodine Rash   Current Outpatient Prescriptions on File Prior to Visit  Medication Sig Dispense Refill  . acetaminophen (TYLENOL) 500 MG tablet Take 1 tablet (500 mg total) by mouth every 6 (six) hours as needed for mild pain. 30 tablet 0  . ALPRAZolam (XANAX) 0.25 MG tablet TAKE ONE TABLET BY MOUTH TWICE DAILY AS NEEDED 60 tablet 3  . amLODipine (NORVASC) 2.5 MG tablet TAKE ONE TABLET BY MOUTH ONCE DAILY 90 tablet 3  . atorvastatin (LIPITOR) 10 MG tablet TAKE ONE TABLET BY MOUTH ONCE DAILY AT 6 PM 30 tablet 10  . diltiazem (DILACOR XR) 180 MG 24 hr capsule TAKE ONE CAPSULE BY MOUTH ONCE DAILY 90 capsule 0  . ELIQUIS 2.5 MG TABS tablet TAKE ONE TABLET BY MOUTH TWICE DAILY 180 tablet 3  . hydrocortisone (ANUSOL-HC) 25 MG suppository Place 1 suppository (25 mg total) rectally every 12 (twelve) hours. 12 suppository 1  . levETIRAcetam (KEPPRA) 250 MG tablet TAKE ONE TABLET BY MOUTH TWICE DAILY 120 tablet 5  . polyethylene glycol (MIRALAX / GLYCOLAX) packet Take 17 g by mouth daily. 14 each 0  . senna-docusate (SENOKOT-S) 8.6-50 MG tablet Take 1 tablet by mouth 2 (two) times daily.    . sodium chloride (OCEAN) 0.65 % SOLN nasal spray Place 1 spray into both nostrils as needed for congestion.    . traMADol (ULTRAM) 50 MG tablet Take 1-2 tablets (50-100 mg total) by mouth every 6 (six) hours as needed for moderate pain or severe pain. 10 tablet 0  . traZODone (DESYREL) 50 MG tablet Take 0.5-1 tablets (25-50 mg total) by mouth at bedtime as needed for sleep. 90 tablet 1   No current facility-administered medications on file prior to visit.    Review of Systems  Constitutional: Negative for other unusual  diaphoresis or sweats HENT: Negative for ear discharge or swelling Eyes: Negative for other worsening visual disturbances Respiratory: Negative for stridor or other swelling  Gastrointestinal: Negative for worsening distension or other blood Genitourinary: Negative for retention or other urinary change Musculoskeletal: Negative for other MSK pain or swelling Skin: Negative for color change or other new lesions Neurological: Negative for worsening tremors and other numbness  Psychiatric/Behavioral: Negative for worsening agitation or other fatigue All other system neg per pt    Objective:   Physical Exam BP (!) 170/76   Pulse 61   Ht 5\' 7"  (1.702 m)   Wt 119 lb (54 kg)   SpO2 97%   BMI 18.64 kg/m  VS noted, not ill appearing Constitutional: Pt appears in NAD HENT: Head: NCAT.  Right Ear: External ear normal.  Left  Ear: External ear normal.  Eyes: . Pupils are equal, round, and reactive to light. Conjunctivae and EOM are normal with chronic right exotropria Nose: without d/c or deformity Neck: Neck supple. Gross normal ROM Cardiovascular: Normal rate and regular rhythm.   Pulmonary/Chest: Effort normal and breath sounds without rales or wheezing.  Abd:  Soft, NT, ND, + BS, no organomegaly, no flank tender Neurological: Pt is alert. At baseline orientation, motor grossly intact Skin: Skin is warm. No rashes, other new lesions, no LE edema Psychiatric: Pt behavior is normal without agitation  No other exam findings Lab Results  Component Value Date   WBC 5.3 04/09/2017   HGB 11.5 (L) 04/09/2017   HCT 36.5 04/09/2017   PLT 197 04/09/2017   GLUCOSE 84 04/09/2017   CHOL 132 10/30/2015   TRIG 71 10/30/2015   HDL 33 (L) 10/30/2015   LDLDIRECT 175.3 01/19/2008   LDLCALC 85 10/30/2015   ALT 48 04/07/2017   AST 48 (H) 04/07/2017   NA 139 04/09/2017   K 4.0 04/09/2017   CL 108 04/09/2017   CREATININE 0.97 04/09/2017   BUN 17 04/09/2017   CO2 25 04/09/2017   TSH 1.110  04/06/2017   INR 1.59 04/06/2017   HGBA1C 5.5 09/23/2014       Assessment & Plan:

## 2017-04-12 NOTE — Assessment & Plan Note (Addendum)
D/w pt, she now appears to comprehend and retain information, discussed that although the lesion appaears highly suspicious, she would need to have further evaluation and pulm referral, I have given the number to call but will also refer per EMR today

## 2017-04-13 ENCOUNTER — Telehealth: Payer: Self-pay

## 2017-04-13 ENCOUNTER — Other Ambulatory Visit (INDEPENDENT_AMBULATORY_CARE_PROVIDER_SITE_OTHER): Payer: Medicare Other

## 2017-04-13 ENCOUNTER — Encounter: Payer: Self-pay | Admitting: Internal Medicine

## 2017-04-13 DIAGNOSIS — R739 Hyperglycemia, unspecified: Secondary | ICD-10-CM | POA: Diagnosis not present

## 2017-04-13 DIAGNOSIS — R918 Other nonspecific abnormal finding of lung field: Secondary | ICD-10-CM | POA: Diagnosis not present

## 2017-04-13 DIAGNOSIS — N390 Urinary tract infection, site not specified: Secondary | ICD-10-CM | POA: Diagnosis not present

## 2017-04-13 LAB — CBC WITH DIFFERENTIAL/PLATELET
BASOS ABS: 0 10*3/uL (ref 0.0–0.1)
Basophils Relative: 0.3 % (ref 0.0–3.0)
EOS PCT: 3.9 % (ref 0.0–5.0)
Eosinophils Absolute: 0.4 10*3/uL (ref 0.0–0.7)
HEMATOCRIT: 41.9 % (ref 36.0–46.0)
Hemoglobin: 13.9 g/dL (ref 12.0–15.0)
LYMPHS ABS: 2.8 10*3/uL (ref 0.7–4.0)
LYMPHS PCT: 29.3 % (ref 12.0–46.0)
MCHC: 33.1 g/dL (ref 30.0–36.0)
MCV: 90.1 fl (ref 78.0–100.0)
MONOS PCT: 6 % (ref 3.0–12.0)
Monocytes Absolute: 0.6 10*3/uL (ref 0.1–1.0)
NEUTROS PCT: 60.5 % (ref 43.0–77.0)
Neutro Abs: 5.8 10*3/uL (ref 1.4–7.7)
Platelets: 325 10*3/uL (ref 150.0–400.0)
RBC: 4.65 Mil/uL (ref 3.87–5.11)
RDW: 14.7 % (ref 11.5–15.5)
WBC: 9.6 10*3/uL (ref 4.0–10.5)

## 2017-04-13 LAB — URINALYSIS, ROUTINE W REFLEX MICROSCOPIC
Bilirubin Urine: NEGATIVE
Hgb urine dipstick: NEGATIVE
Ketones, ur: NEGATIVE
Leukocytes, UA: NEGATIVE
Nitrite: NEGATIVE
RBC / HPF: NONE SEEN (ref 0–?)
Specific Gravity, Urine: 1.01 (ref 1.000–1.030)
Total Protein, Urine: NEGATIVE
Urine Glucose: NEGATIVE
Urobilinogen, UA: 0.2 (ref 0.0–1.0)
pH: 7 (ref 5.0–8.0)

## 2017-04-13 LAB — HEPATIC FUNCTION PANEL
ALBUMIN: 3.6 g/dL (ref 3.5–5.2)
ALK PHOS: 118 U/L — AB (ref 39–117)
ALT: 26 U/L (ref 0–35)
AST: 16 U/L (ref 0–37)
Bilirubin, Direct: 0.2 mg/dL (ref 0.0–0.3)
Total Bilirubin: 0.7 mg/dL (ref 0.2–1.2)
Total Protein: 6.8 g/dL (ref 6.0–8.3)

## 2017-04-13 LAB — HEMOGLOBIN A1C: Hgb A1c MFr Bld: 6 % (ref 4.6–6.5)

## 2017-04-13 LAB — BASIC METABOLIC PANEL
BUN: 15 mg/dL (ref 6–23)
CALCIUM: 9 mg/dL (ref 8.4–10.5)
CO2: 26 mEq/L (ref 19–32)
CREATININE: 1.12 mg/dL (ref 0.40–1.20)
Chloride: 105 mEq/L (ref 96–112)
GFR: 49.36 mL/min — AB (ref >60.00)
Glucose, Bld: 190 mg/dL — ABNORMAL HIGH (ref 70–99)
Potassium: 4 mEq/L (ref 3.5–5.1)
Sodium: 139 mEq/L (ref 135–145)

## 2017-04-13 NOTE — Telephone Encounter (Signed)
-----   Message from Biagio Borg, MD sent at 04/13/2017  1:13 PM EDT ----- Emmanuelle Hibbitts for LAB addon - hgba1c - hyperglycemia

## 2017-04-13 NOTE — Telephone Encounter (Signed)
Done

## 2017-04-14 ENCOUNTER — Emergency Department (HOSPITAL_COMMUNITY): Payer: Medicare Other

## 2017-04-14 ENCOUNTER — Encounter (HOSPITAL_COMMUNITY): Payer: Self-pay | Admitting: Emergency Medicine

## 2017-04-14 ENCOUNTER — Observation Stay (HOSPITAL_COMMUNITY)
Admission: EM | Admit: 2017-04-14 | Discharge: 2017-04-15 | Disposition: A | Discharge status: Home / Self Care | Payer: Medicare Other | Attending: Internal Medicine | Admitting: Internal Medicine

## 2017-04-14 DIAGNOSIS — Z91010 Allergy to peanuts: Secondary | ICD-10-CM | POA: Diagnosis not present

## 2017-04-14 DIAGNOSIS — R569 Unspecified convulsions: Secondary | ICD-10-CM | POA: Diagnosis not present

## 2017-04-14 DIAGNOSIS — I110 Hypertensive heart disease with heart failure: Secondary | ICD-10-CM | POA: Insufficient documentation

## 2017-04-14 DIAGNOSIS — Z96641 Presence of right artificial hip joint: Secondary | ICD-10-CM | POA: Insufficient documentation

## 2017-04-14 DIAGNOSIS — R079 Chest pain, unspecified: Secondary | ICD-10-CM | POA: Diagnosis not present

## 2017-04-14 DIAGNOSIS — Z79899 Other long term (current) drug therapy: Secondary | ICD-10-CM | POA: Diagnosis not present

## 2017-04-14 DIAGNOSIS — Z91040 Latex allergy status: Secondary | ICD-10-CM | POA: Insufficient documentation

## 2017-04-14 DIAGNOSIS — I4819 Other persistent atrial fibrillation: Secondary | ICD-10-CM | POA: Diagnosis present

## 2017-04-14 DIAGNOSIS — I10 Essential (primary) hypertension: Secondary | ICD-10-CM | POA: Diagnosis present

## 2017-04-14 DIAGNOSIS — IMO0001 Reserved for inherently not codable concepts without codable children: Secondary | ICD-10-CM | POA: Diagnosis present

## 2017-04-14 DIAGNOSIS — R55 Syncope and collapse: Secondary | ICD-10-CM

## 2017-04-14 DIAGNOSIS — R0602 Shortness of breath: Secondary | ICD-10-CM | POA: Diagnosis not present

## 2017-04-14 DIAGNOSIS — E785 Hyperlipidemia, unspecified: Secondary | ICD-10-CM | POA: Diagnosis not present

## 2017-04-14 DIAGNOSIS — K219 Gastro-esophageal reflux disease without esophagitis: Secondary | ICD-10-CM

## 2017-04-14 DIAGNOSIS — Z8673 Personal history of transient ischemic attack (TIA), and cerebral infarction without residual deficits: Secondary | ICD-10-CM | POA: Diagnosis not present

## 2017-04-14 DIAGNOSIS — Z87891 Personal history of nicotine dependence: Secondary | ICD-10-CM | POA: Diagnosis not present

## 2017-04-14 DIAGNOSIS — R0789 Other chest pain: Principal | ICD-10-CM | POA: Diagnosis present

## 2017-04-14 DIAGNOSIS — I481 Persistent atrial fibrillation: Secondary | ICD-10-CM | POA: Diagnosis not present

## 2017-04-14 DIAGNOSIS — I5033 Acute on chronic diastolic (congestive) heart failure: Secondary | ICD-10-CM | POA: Diagnosis not present

## 2017-04-14 DIAGNOSIS — K649 Unspecified hemorrhoids: Secondary | ICD-10-CM

## 2017-04-14 DIAGNOSIS — I480 Paroxysmal atrial fibrillation: Secondary | ICD-10-CM | POA: Diagnosis present

## 2017-04-14 DIAGNOSIS — R918 Other nonspecific abnormal finding of lung field: Secondary | ICD-10-CM | POA: Diagnosis not present

## 2017-04-14 DIAGNOSIS — R911 Solitary pulmonary nodule: Secondary | ICD-10-CM | POA: Diagnosis present

## 2017-04-14 HISTORY — DX: Unspecified convulsions: R56.9

## 2017-04-14 LAB — CBC
HCT: 39.5 % (ref 36.0–46.0)
HEMOGLOBIN: 12.6 g/dL (ref 12.0–15.0)
MCH: 28.8 pg (ref 26.0–34.0)
MCHC: 31.9 g/dL (ref 30.0–36.0)
MCV: 90.2 fL (ref 78.0–100.0)
PLATELETS: 293 10*3/uL (ref 150–400)
RBC: 4.38 MIL/uL (ref 3.87–5.11)
RDW: 15.5 % (ref 11.5–15.5)
WBC: 8.5 10*3/uL (ref 4.0–10.5)

## 2017-04-14 LAB — BASIC METABOLIC PANEL
ANION GAP: 9 (ref 5–15)
BUN: 18 mg/dL (ref 6–20)
CHLORIDE: 109 mmol/L (ref 101–111)
CO2: 23 mmol/L (ref 22–32)
CREATININE: 1.08 mg/dL — AB (ref 0.44–1.00)
Calcium: 8.3 mg/dL — ABNORMAL LOW (ref 8.9–10.3)
GFR calc Af Amer: 53 mL/min — ABNORMAL LOW (ref >60)
GFR calc non Af Amer: 46 mL/min — ABNORMAL LOW (ref >60)
Glucose, Bld: 99 mg/dL (ref 65–99)
POTASSIUM: 3.6 mmol/L (ref 3.5–5.1)
SODIUM: 141 mmol/L (ref 135–145)

## 2017-04-14 LAB — URINALYSIS, ROUTINE W REFLEX MICROSCOPIC
BILIRUBIN URINE: NEGATIVE
Glucose, UA: NEGATIVE mg/dL
HGB URINE DIPSTICK: NEGATIVE
KETONES UR: NEGATIVE mg/dL
NITRITE: NEGATIVE
PH: 7 (ref 5.0–8.0)
Protein, ur: NEGATIVE mg/dL
Specific Gravity, Urine: 1.004 — ABNORMAL LOW (ref 1.005–1.030)

## 2017-04-14 LAB — TROPONIN I
Troponin I: 0.03 ng/mL (ref <0.03)
Troponin I: 0.03 ng/mL (ref <0.03)

## 2017-04-14 LAB — I-STAT TROPONIN, ED: TROPONIN I, POC: 0.03 ng/mL (ref 0.00–0.08)

## 2017-04-14 MED ORDER — ACETAMINOPHEN 650 MG RE SUPP: 650.0000 mg | Freq: Four times a day (QID) | RECTAL | Status: DC | PRN

## 2017-04-14 MED ORDER — SODIUM CHLORIDE 0.9% FLUSH: 3.0000 mL | INTRAVENOUS | Status: DC | PRN

## 2017-04-14 MED ORDER — PANTOPRAZOLE SODIUM 40 MG PO TBEC
40.0000 mg | DELAYED_RELEASE_TABLET | Freq: Every day | ORAL | Status: DC
  Administered 2017-04-14 – 2017-04-15 (×2): 40 mg via ORAL
  Filled 2017-04-14 (×2): qty 1

## 2017-04-14 MED ORDER — SODIUM CHLORIDE 0.9 % IV SOLN: 250.0000 mL | INTRAVENOUS | Status: DC | PRN

## 2017-04-14 MED ORDER — ACETAMINOPHEN 500 MG PO TABS: 1000.0000 mg | ORAL_TABLET | Freq: Four times a day (QID) | ORAL | Status: DC | PRN

## 2017-04-14 MED ORDER — SODIUM CHLORIDE 0.9% FLUSH
3.0000 mL | Freq: Two times a day (BID) | INTRAVENOUS | Status: DC
  Administered 2017-04-14 – 2017-04-15 (×3): 3 mL via INTRAVENOUS

## 2017-04-14 MED ORDER — DILTIAZEM HCL ER COATED BEADS 180 MG PO CP24
180.0000 mg | ORAL_CAPSULE | Freq: Every day | ORAL | Status: DC
  Administered 2017-04-14 – 2017-04-15 (×2): 180 mg via ORAL
  Filled 2017-04-14 (×4): qty 1

## 2017-04-14 MED ORDER — HYDROCORTISONE ACETATE 25 MG RE SUPP
25.0000 mg | Freq: Two times a day (BID) | RECTAL | Status: DC
  Filled 2017-04-14 (×2): qty 1

## 2017-04-14 MED ORDER — LEVETIRACETAM 250 MG PO TABS
250.0000 mg | ORAL_TABLET | Freq: Two times a day (BID) | ORAL | Status: DC
  Administered 2017-04-14 – 2017-04-15 (×3): 250 mg via ORAL
  Filled 2017-04-14 (×3): qty 1

## 2017-04-14 MED ORDER — POLYETHYLENE GLYCOL 3350 17 G PO PACK: 17.0000 g | PACK | Freq: Every day | ORAL | Status: DC

## 2017-04-14 MED ORDER — APIXABAN 2.5 MG PO TABS
2.5000 mg | ORAL_TABLET | Freq: Two times a day (BID) | ORAL | Status: DC
  Administered 2017-04-14 – 2017-04-15 (×3): 2.5 mg via ORAL
  Filled 2017-04-14 (×3): qty 1

## 2017-04-14 MED ORDER — ATORVASTATIN CALCIUM 10 MG PO TABS
10.0000 mg | ORAL_TABLET | Freq: Every day | ORAL | Status: DC
  Administered 2017-04-14: 10 mg via ORAL
  Filled 2017-04-14: qty 1

## 2017-04-14 MED ORDER — TRAZODONE HCL 50 MG PO TABS: 25.0000 mg | ORAL_TABLET | Freq: Every evening | ORAL | Status: DC | PRN

## 2017-04-14 MED ORDER — SENNOSIDES-DOCUSATE SODIUM 8.6-50 MG PO TABS
1.0000 | ORAL_TABLET | Freq: Two times a day (BID) | ORAL | Status: DC
  Administered 2017-04-14 – 2017-04-15 (×3): 1 via ORAL
  Filled 2017-04-14 (×3): qty 1

## 2017-04-14 MED ORDER — ASPIRIN 81 MG PO CHEW: 324.0000 mg | CHEWABLE_TABLET | Freq: Once | ORAL | Status: DC

## 2017-04-14 MED ORDER — MORPHINE SULFATE (PF) 4 MG/ML IV SOLN: 4.0000 mg | INTRAVENOUS | Status: DC | PRN

## 2017-04-14 MED ORDER — ALPRAZOLAM 0.25 MG PO TABS
0.2500 mg | ORAL_TABLET | Freq: Two times a day (BID) | ORAL | Status: DC | PRN
  Administered 2017-04-15: 0.25 mg via ORAL
  Filled 2017-04-14: qty 1

## 2017-04-14 MED ORDER — AMLODIPINE BESYLATE 2.5 MG PO TABS
2.5000 mg | ORAL_TABLET | Freq: Every day | ORAL | Status: DC
  Administered 2017-04-14 – 2017-04-15 (×2): 2.5 mg via ORAL
  Filled 2017-04-14 (×2): qty 1

## 2017-04-14 MED ORDER — TRAMADOL HCL 50 MG PO TABS: 50.0000 mg | ORAL_TABLET | Freq: Four times a day (QID) | ORAL | Status: DC | PRN

## 2017-04-14 MED ORDER — GI COCKTAIL ~~LOC~~: 30.0000 mL | Freq: Four times a day (QID) | ORAL | Status: DC | PRN

## 2017-04-14 MED ORDER — ONDANSETRON HCL 4 MG/2ML IJ SOLN: 4.0000 mg | Freq: Four times a day (QID) | INTRAMUSCULAR | Status: DC | PRN

## 2017-04-14 MED ORDER — ONDANSETRON HCL 4 MG PO TABS
4.0000 mg | ORAL_TABLET | Freq: Four times a day (QID) | ORAL | Status: DC | PRN
  Administered 2017-04-15: 4 mg via ORAL
  Filled 2017-04-14: qty 1

## 2017-04-14 NOTE — H&P (Signed)
History and Physical    Kayla Kent NOM:[id number] DOB: 1932-06-08 DOA: 04/14/2017  PCP: Biagio Borg, MD Patient coming from: home  Chief Complaint: Chest pressure  HPI: LEIDY MASSAR is a 83 y.o. female with medical history significant of Afib, anxiety, hypertension, hyperlipidemia, PVD. Patient presenting after single episode of what was described by the patient has "chest pressure. "  Episode began on the day of admission when patient was ambulating towards the bathroom. She developed substernal chest pressure which was associated with onset of shortness of breath and diaphoresis. Patient hit her medical alert button which did nothing because the batteries were dead. Patient then called her family members who called EMS. EMS administered for that nitroglycerin with complete resolution of the chest pain. Patient refused aspirin due to her stating that she is on blood thinners. Patient never had an episode like this before. Of note patient was recently admitted for sepsis due to a UTI and A. fib with RVR. Patient states that she's been in her normal state of health since discharge from hospital. Patient's last dose of antibiotic was on 04/10/2017. Patient denies abdominal pain, dysuria, frequency, back pain, neck stiffness, headache, focal neurological deficits, cough.    ED Course: objective findings outlined below.   Review of Systems: As per HPI otherwise all other systems reviewed and are negative  Ambulatory Status:no restrictions  Past Medical History:  Diagnosis Date  . Allergic rhinitis   . Anxiety   . HLD (hyperlipidemia)   . HTN (hypertension)   . Intolerance of drug    orthostatic  . Paroxysmal supraventricular tachycardia ([place])   . PVD (peripheral vascular disease) ([place])   . Seizure ([place])   . Syncope and collapse   . Uterine prolapse without mention of vaginal wall prolapse     Past Surgical History:  Procedure Laterality Date  . CARDIOVERSION N/A 11/05/2014   Procedure: CARDIOVERSION;  Surgeon: Candee Furbish, MD;  Location: [facility] ENDOSCOPY;  Service: Cardiovascular;  Laterality: N/A;  . cataract surgery  08/2015  . CHOLECYSTECTOMY    . corrective eye surgery     as a child  . INTRAMEDULLARY (IM) NAIL INTERTROCHANTERIC Left 11/21/2016   Procedure: INTRAMEDULLARY (IM) NAIL INTERTROCHANTRIC HEMI;  Surgeon: Newt Minion, MD;  Location: [place];  Service: Orthopedics;  Laterality: Left;  . IVD removed    . OPEN REDUCTION INTERNAL FIXATION (ORIF) DISTAL RADIAL FRACTURE Right 08/29/2014   Procedure: OPEN REDUCTION INTERNAL FIXATION (ORIF) DISTAL RADIAL FRACTURE;  Surgeon: Marianna Payment, MD;  Location: [place];  Service: Orthopedics;  Laterality: Right;  . RF ablation PSVT     summer '10  . stress cardiolite  08/05/93  . TONSILLECTOMY    . TOTAL HIP ARTHROPLASTY Right 08/29/2014   Procedure: Right Hip Hemi Arthroplasty;  Surgeon: Marianna Payment, MD;  Location: [place];  Service: Orthopedics;  Laterality: Right;  Hip procedure 1st wants Peg Board, [employer], [place].     Social History   Social History  . Marital status: Widowed    Spouse name: N/A  . Number of children: 2  . Years of education: 58   Occupational History  . retired Radio producer    Social History Main Topics  . Smoking status: Former Smoker    Quit date: 12/08/1955  . Smokeless tobacco: Never Used     Comment: quit in 1995   . Alcohol use No  . Drug use: No  . Sexual activity: No   Other Topics  Concern  . Not on file   Social History Narrative   [employer], [employer]-BA, [employer]-early childhood. Married '55 -56 years.  1 son - 65; 1 daughter - 21; 3 grandchildren . Lives alone. ACP - discussed and provided packet on end of life care (Feb '13)   Patient is now widowed.   Patient is right-handed.   Patient drinks tea daily.    Allergies  Allergen Reactions  . Chocolate Hives  . Codeine Other (See Comments)    Patient states she acts crazy  . Diazepam  Other (See Comments)    Patient states she acts crazy.  . Fruit & Vegetable Daily [Nutritional Supplements] Hives and Swelling    peaches  . Iohexol Hives     Code: HIVES, Desc: pt gets 13 hr pre-meds, Onset Date: [id number]   . Latex Hives  . Peach [Prunus Persica] Hives  . Peanut-Containing Drug Products Hives and Swelling  . Penicillins Hives, Itching and Swelling    Has patient had a PCN reaction causing immediate rash, facial/tongue/throat swelling, SOB or lightheadedness with hypotension: Yes Has patient had a PCN reaction causing severe rash involving mucus membranes or skin necrosis: No Has patient had a PCN reaction that required hospitalization No Has patient had a PCN reaction occurring within the last 10 years: No If all of the above answers are "NO", then may proceed with Cephalosporin use.   . Strawberry Extract Hives  . Sulfonamide Derivatives Hives    nausea  . Wheat Shortness Of Breath    Shortness of breath  . Wheat Bran Shortness Of Breath  . Ambien [Zolpidem Tartrate] Other (See Comments)    hallucinations  . Sulfamethoxazole Hives  . Crestor [Rosuvastatin Calcium] Rash  . Iodine Rash    Family History  Problem Relation Age of Onset  . Mental illness Father        suicide  . Arthritis Father   . Hyperlipidemia Sister   . Hypertension Sister   . Heart disease Brother        CAD/MI  . Hypertension Brother   . Coronary artery disease Brother   . Hypertension Unknown        family hx  . Heart attack Brother   . Colon cancer Neg Hx   . Breast cancer Neg Hx   . Diabetes Neg Hx   . Stroke Neg Hx       Prior to Admission medications   Medication Sig Start Date End Date Taking? Authorizing Provider  acetaminophen (TYLENOL) 500 MG tablet Take 1 tablet (500 mg total) by mouth every 6 (six) hours as needed for mild pain. 11/22/16  Yes Newt Minion, MD  ALPRAZolam Duanne Moron) 0.25 MG tablet TAKE ONE TABLET BY MOUTH TWICE DAILY AS NEEDED 03/22/17  Yes Biagio Borg,  MD  amLODipine (NORVASC) 2.5 MG tablet TAKE ONE TABLET BY MOUTH ONCE DAILY 04/01/17  Yes Fay Records, MD  atorvastatin (LIPITOR) 10 MG tablet TAKE ONE TABLET BY MOUTH ONCE DAILY AT 6 PM 09/06/16  Yes Fay Records, MD  diltiazem (DILACOR XR) 180 MG 24 hr capsule TAKE ONE CAPSULE BY MOUTH ONCE DAILY 02/23/17  Yes Biagio Borg, MD  ELIQUIS 2.5 MG TABS tablet TAKE ONE TABLET BY MOUTH TWICE DAILY 10/08/16  Yes Fay Records, MD  hydrocortisone (ANUSOL-HC) 25 MG suppository Place 1 suppository (25 mg total) rectally every 12 (twelve) hours. 03/03/17 03/03/18 Yes Biagio Borg, MD  levETIRAcetam (KEPPRA) 250 MG tablet TAKE ONE  TABLET BY MOUTH TWICE DAILY 11/08/16  Yes Biagio Borg, MD  senna-docusate (SENOKOT-S) 8.6-50 MG tablet Take 1 tablet by mouth 2 (two) times daily. 11/25/16  Yes Rama, Venetia Maxon, MD  sodium chloride (OCEAN) 0.65 % SOLN nasal spray Place 1 spray into both nostrils as needed for congestion.   Yes [provider]  traMADol (ULTRAM) 50 MG tablet Take 1-2 tablets (50-100 mg total) by mouth every 6 (six) hours as needed for moderate pain or severe pain. 11/25/16  Yes Rama, Venetia Maxon, MD  traZODone (DESYREL) 50 MG tablet Take 0.5-1 tablets (25-50 mg total) by mouth at bedtime as needed for sleep. 03/03/17  Yes Biagio Borg, MD  polyethylene glycol [facility] / Floria Raveling) packet Take 17 g by mouth daily. 11/25/16   Rama, Venetia Maxon, MD    Physical Exam: Vitals:   04/14/17 0645 04/14/17 0745 04/14/17 0800 04/14/17 0830  BP: (!) 157/75 (!) 147/59 (!) 143/65 (!) 145/55  Pulse: 88 72 75 85  Resp: 16 15 16 16   Temp:      TempSrc:      SpO2: 97% 96% 94% 97%     General:  Appears calm and comfortable Eyes:  PERRL, L eye OMI, R eye w/ Exotopria ,  ENT:  grossly normal hearing, lips & tongue, mmm Neck:  no LAD, masses or thyromegaly Cardiovascular:  Regularly irregular,  no m/r/g. No LE edema.  Respiratory:  CTA bilaterally, no w/r/r. Normal respiratory effort. Abdomen:  soft,  ntnd, NABS Skin:  no rash or induration seen on limited exam Musculoskeletal:  grossly normal tone BUE/BLE, good ROM, no bony abnormality Psychiatric:  grossly normal mood and affect, speech fluent and appropriate, AOx3 Neurologic:  CN 2-12 grossly intact, moves all extremities in coordinated fashion, sensation intact  Labs on Admission: I have personally reviewed following labs and imaging studies  CBC:  Recent Labs Lab 04/08/17 0445 04/09/17 0238 04/13/17 0839 04/14/17 0541  WBC 8.9 5.3 9.6 8.5  NEUTROABS  --   --  5.8  --   HGB 11.1* 11.5* 13.9 12.6  HCT 35.0* 36.5 41.9 39.5  MCV 93.1 90.8 90.1 90.2  PLT 174 197 325.0 517   Basic Metabolic Panel:  Recent Labs Lab 04/08/17 0445 04/09/17 0238 04/13/17 0839 04/14/17 0541  NA 137 139 139 141  K 4.1 4.0 4.0 3.6  CL 108 108 105 109  CO2 23 25 26 23   GLUCOSE 86 84 190* 99  BUN 19 17 15 18   CREATININE 1.02* 0.97 1.12 1.08*  CALCIUM 7.7* 8.3* 9.0 8.3*  MG  --  2.0  --   --    GFR: Estimated Creatinine Clearance: 33.6 mL/min (A) (by C-G formula based on SCr of 1.08 mg/dL (H)). Liver Function Tests:  Recent Labs Lab 04/13/17 0839  AST 16  ALT 26  ALKPHOS 118*  BILITOT 0.7  PROT 6.8  ALBUMIN 3.6   No results for input(s): LIPASE, AMYLASE in the last 168 hours. No results for input(s): AMMONIA in the last 168 hours. Coagulation Profile: No results for input(s): INR, PROTIME in the last 168 hours. Cardiac Enzymes: No results for input(s): CKTOTAL, CKMB, CKMBINDEX, TROPONINI in the last 168 hours. BNP (last 3 results)  Recent Labs  10/22/16 1135  PROBNP 410.0*   HbA1C:  Recent Labs  04/13/17 1545  HGBA1C 6.0   CBG: No results for input(s): GLUCAP in the last 168 hours. Lipid Profile: No results for input(s): CHOL, HDL, LDLCALC, TRIG, CHOLHDL, LDLDIRECT in  the last 72 hours. Thyroid Function Tests: No results for input(s): TSH, T4TOTAL, FREET4, T3FREE, THYROIDAB in the last 72 hours. Anemia  Panel: No results for input(s): VITAMINB12, FOLATE, FERRITIN, TIBC, IRON, RETICCTPCT in the last 72 hours. Urine analysis:    Component Value Date/Time   COLORURINE STRAW (A) 04/14/2017 0646   APPEARANCEUR CLEAR 04/14/2017 0646   APPEARANCEUR Clear 03/23/2017 1045   LABSPEC 1.004 (L) 04/14/2017 0646   PHURINE 7.0 04/14/2017 0646   GLUCOSEU NEGATIVE 04/14/2017 0646   GLUCOSEU NEGATIVE 04/13/2017 0839   HGBUR NEGATIVE 04/14/2017 0646   BILIRUBINUR NEGATIVE 04/14/2017 0646   BILIRUBINUR Negative 03/23/2017 1045   KETONESUR NEGATIVE 04/14/2017 0646   PROTEINUR NEGATIVE 04/14/2017 0646   UROBILINOGEN 0.2 04/13/2017 0839   NITRITE NEGATIVE 04/14/2017 0646   LEUKOCYTESUR SMALL (A) 04/14/2017 0646   LEUKOCYTESUR Negative 03/23/2017 1045    Creatinine Clearance: Estimated Creatinine Clearance: 33.6 mL/min (A) (by C-G formula based on SCr of 1.08 mg/dL (H)).  Sepsis Labs: @LABRCNTIP (procalcitonin:4,lacticidven:4) ) Recent Results (from the past 240 hour(s))  Urine culture     Status: None   Collection Time: 04/06/17  7:44 PM  Result Value Ref Range Status   Specimen Description URINE, CATHETERIZED  Final   Special Requests NONE  Final   Culture NO GROWTH  Final   Report Status 04/08/2017 FINAL  Final  Blood Culture (routine x 2)     Status: None   Collection Time: 04/06/17  8:03 PM  Result Value Ref Range Status   Specimen Description BLOOD LEFT HAND  Final   Special Requests IN PEDIATRIC BOTTLE Blood Culture adequate volume  Final   Culture NO GROWTH 5 DAYS  Final   Report Status 04/11/2017 FINAL  Final  Blood Culture (routine x 2)     Status: None   Collection Time: 04/06/17  8:07 PM  Result Value Ref Range Status   Specimen Description BLOOD RIGHT ANTECUBITAL  Final   Special Requests   Final    BOTTLES DRAWN AEROBIC AND ANAEROBIC Blood Culture adequate volume   Culture NO GROWTH 5 DAYS  Final   Report Status 04/11/2017 FINAL  Final     Radiological Exams on  Admission: Dg Chest 2 View  Result Date: 04/14/2017 CLINICAL DATA:  Shortness of breath.  Chest pain . EXAM: CHEST  2 VIEW COMPARISON:  04/07/2017 .  Chest x-ray 04/06/2017. FINDINGS: Mediastinum and hilar structures are normal. Stable cardiomegaly. Mild Kerley B-lines and tiny pleural effusions suggesting mild CHF . Stable mild left base subsegmental atelectasis/ scarring. Right upper lung pulmonary nodular density not identified by chest x-ray, reference is made to prior recent chest x-ray 04/07/2017 for discussion of spiculated pulmonary nodule in the right apex. IMPRESSION: 1. Cardiomegaly with mild Kerley B-lines and tiny pleural effusions suggesting mild CHF. 2. Right upper lobe spiculated pulmonary nodular density not identified by chest x-ray, reference is made to prior chest x-ray of 04/07/2017 for further discussion of management of the right upper lung lesion. 3. Left base subsegmental atelectasis/ scarring again noted. No interim change. Electronically Signed   By: Marcello Moores  Register   On: 04/14/2017 06:26    EKG: Independently reviewed. Afib. No ACS.   Assessment/Plan Active Problems:   HLD (hyperlipidemia)   Essential hypertension   Seizures (HCC)   PAF (paroxysmal atrial fibrillation) (HCC)   Persistent atrial fibrillation (HCC)   Hemorrhoids   Mass of right lung   Chest pain   Chest pain: HEART score 6. Trop nml. EKG w/o ACS.  Single episode of exertional chest pressure associated diaphoresis and shortness of breath relieved with nitroglycerin. Per review of chart patient's last nuclear medicine stress test was approximately 24 years ago with last myocardial catheterization approximately 10-15 years ago which was reportedly normal. No reason for emergent/urgent cardiac evaluation at this time. I have called the patient's cardiology office and have gotten her a follow-up appointment as follows: (Vin Bhagat, PA on 7/10 @ 11am at the [address] office). - cycle trop - EKG in am -  Tele - GI cocktail if pain returns - nitro PRN - Pt refusing ASA  RUL mass: noted on previous admission. 74mm RUL nodule w/ spiculations suspicious of bronchogenic ca. Recommend f/u 93mo f/u PET-CT vs biopsy. - f/u outpt w/ pulm  Afib: rate controlled - continue Eliquis and Dilt  HTN: - continue norvasc  Anxiety: - continue xanax  Insomnia: - continue trazodone  Seizure:  - continue Keppra  Chronic pain: - continue tramadol  Contstipation/hemorrhoids: chronic at baseline - continue anusol, senokot, miralax  HLD: - continue statin   DVT prophylaxis: Eliquis  Code Status: full  Family Communication: daughter  Disposition Plan: pending cp r.o  Consults called: none  Admission status: observation - tele.     Andrian Urbach J MD [facility]  If 7PM-7AM, please contact night-coverage [email or web address] Password [email or web address]  04/14/2017, 9:08 AM

## 2017-04-14 NOTE — ED Notes (Signed)
Attempted report x 2 

## 2017-04-14 NOTE — ED Triage Notes (Signed)
Per EMS pt discharged on Saturday after admission for UTI. Took last antibiotic on Sunday.  Woke up at 0400 this morning and went to the bathroom and began 10/10 chest pressure.  Pt has history of AFIB.  4 nitro given en route.  Initial BP 180/80.  After nitro BP 120/74 and pain/pressure 3/10.  Pt also complains of "sweats" to the back of her neck.  CBG 123.  No aspirin en route as pt refused d/t being on eliquis.

## 2017-04-14 NOTE — ED Provider Notes (Signed)
[facility] DEPT Provider Note   CSN: [id number] Arrival date & time: 04/14/17  0520     History   Chief Complaint Chief Complaint  Patient presents with  . Chest Pain    HPI Kayla Kent is a 83 y.o. female.  HPI Kayla Kent is a 83 y.o. female with history of anxiety,  atrial fibrillation, history of UTIs, presents to emergency department with complaint of chest pressure. Patient states she woke up this morning and went to the bathroom. She states on the way back from the bathroom she started having pressure in her chest and shortness of breath. Denies any nausea. She states she did feel some dizziness and states her neck got sweaty. She states she called her family, and her son who does not live in this area but is a paramedic called EMS. Patient received 4 nitroglycerin by EMS and states her pain is now resolved. Patient is currently symptom-free. Recent admission to the hospital for A. fib with RVR and UTI. States she feels like her symptoms of UTI have resolved. Denies cough, but admits to some allergy symptoms. Denies any worsening swelling in her legs, however she does have chronic intermittent swelling in her legs and just had venous Doppler done a few days ago to rule out DVT. Patient is currently symptom free.  Past Medical History:  Diagnosis Date  . Allergic rhinitis   . Anxiety   . HLD (hyperlipidemia)   . HTN (hypertension)   . Intolerance of drug    orthostatic  . Paroxysmal supraventricular tachycardia ([place])   . PVD (peripheral vascular disease) ([place])   . Syncope and collapse   . Uterine prolapse without mention of vaginal wall prolapse     Patient Active Problem List   Diagnosis Date Noted  . UTI (urinary tract infection) 04/12/2017  . Mass of right lung 04/12/2017  . Acute renal failure ([place])   . Sepsis ([place]) 04/06/2017  . Atrial fibrillation with RVR ([place]) 04/06/2017  . Urinary frequency 03/03/2017  . Hemorrhoids 03/03/2017  . Insomnia  03/03/2017  . Postoperative anemia due to acute blood loss 11/24/2016  . Hematoma left thigh 11/24/2016  . Closed femur fracture ([place]) 11/20/2016  . Pedal edema 10/10/2016  . Persistent atrial fibrillation ([place]) 12/11/2014  . S/P ablation of atrial fibrillation 12/11/2014  . Orthostatic hypotension 12/11/2014  . Paroxysmal SVT (supraventricular tachycardia) ([place]) 12/11/2014  . Atherosclerotic peripheral vascular disease ([place]) 12/11/2014  . PAF (paroxysmal atrial fibrillation) ([place]) 11/05/2014  . Orthostasis 10/03/2014  . Non-compliant behavior 10/01/2014  . TIA (transient ischemic attack) 09/23/2014  . Numbness of left hand   . Acute on chronic diastolic heart failure ([place]) 09/21/2014  . Fracture of femoral neck, right ([place]) 08/28/2014  . Distal radius fracture, right 08/28/2014  . Hip fracture requiring operative repair ([place]) 08/28/2014  . Paroxysmal spells 03/28/2014  . Seizures ([place]) 03/28/2013  . Syncope and collapse 07/27/2012  . Routine health maintenance 12/12/2011  . PERIPHERAL CIRCULATORY DISORDER 04/10/2010  . Chronic diastolic CHF (congestive heart failure) ([place]) 07/17/2009  . Depression 07/04/2009  . Anxiety 01/19/2008  . HLD (hyperlipidemia) 11/14/2007  . Essential hypertension 11/14/2007  . PAROXYSMAL ATRIAL TACHYCARDIA 11/14/2007  . UTERINE PROLAPSE 11/14/2007    Past Surgical History:  Procedure Laterality Date  . CARDIOVERSION N/A 11/05/2014   Procedure: CARDIOVERSION;  Surgeon: Candee Furbish, MD;  Location[facility] ENDOSCOPY;  Service: Cardiovascular;  Laterality: N/A;  . cataract surgery  08/2015  . CHOLECYSTECTOMY    . corrective  eye surgery     as a child  . INTRAMEDULLARY (IM) NAIL INTERTROCHANTERIC Left 11/21/2016   Procedure: INTRAMEDULLARY (IM) NAIL INTERTROCHANTRIC HEMI;  Surgeon: Newt Minion, MD;  Location: [facility];  Service: Orthopedics;  Laterality: Left;  . IVD removed    . OPEN REDUCTION INTERNAL FIXATION (ORIF) DISTAL RADIAL FRACTURE Right 08/29/2014     Procedure: OPEN REDUCTION INTERNAL FIXATION (ORIF) DISTAL RADIAL FRACTURE;  Surgeon: Marianna Payment, MD;  Location: [facility];  Service: Orthopedics;  Laterality: Right;  . RF ablation PSVT     summer '10  . stress cardiolite  08/05/93  . TONSILLECTOMY    . TOTAL HIP ARTHROPLASTY Right 08/29/2014   Procedure: Right Hip Hemi Arthroplasty;  Surgeon: Marianna Payment, MD;  Location: [facility];  Service: Orthopedics;  Laterality: Right;  Hip procedure 1st wants Peg Board, [employer], [place].     OB History    No data available       Home Medications    Prior to Admission medications   Medication Sig Start Date End Date Taking? Authorizing Provider  acetaminophen (TYLENOL) 500 MG tablet Take 1 tablet (500 mg total) by mouth every 6 (six) hours as needed for mild pain. 11/22/16  Yes Newt Minion, MD  ALPRAZolam Duanne Moron) 0.25 MG tablet TAKE ONE TABLET BY MOUTH TWICE DAILY AS NEEDED 03/22/17  Yes Biagio Borg, MD  amLODipine (NORVASC) 2.5 MG tablet TAKE ONE TABLET BY MOUTH ONCE DAILY 04/01/17  Yes Fay Records, MD  atorvastatin (LIPITOR) 10 MG tablet TAKE ONE TABLET BY MOUTH ONCE DAILY AT 6 PM 09/06/16  Yes Fay Records, MD  diltiazem (DILACOR XR) 180 MG 24 hr capsule TAKE ONE CAPSULE BY MOUTH ONCE DAILY 02/23/17  Yes Biagio Borg, MD  ELIQUIS 2.5 MG TABS tablet TAKE ONE TABLET BY MOUTH TWICE DAILY 10/08/16  Yes Fay Records, MD  hydrocortisone (ANUSOL-HC) 25 MG suppository Place 1 suppository (25 mg total) rectally every 12 (twelve) hours. 03/03/17 03/03/18 Yes Biagio Borg, MD  levETIRAcetam (KEPPRA) 250 MG tablet TAKE ONE TABLET BY MOUTH TWICE DAILY 11/08/16  Yes Biagio Borg, MD  senna-docusate (SENOKOT-S) 8.6-50 MG tablet Take 1 tablet by mouth 2 (two) times daily. 11/25/16  Yes Rama, Venetia Maxon, MD  sodium chloride (OCEAN) 0.65 % SOLN nasal spray Place 1 spray into both nostrils as needed for congestion.   Yes [provider]  traMADol (ULTRAM) 50 MG tablet Take 1-2 tablets  (50-100 mg total) by mouth every 6 (six) hours as needed for moderate pain or severe pain. 11/25/16  Yes Rama, Venetia Maxon, MD  traZODone (DESYREL) 50 MG tablet Take 0.5-1 tablets (25-50 mg total) by mouth at bedtime as needed for sleep. 03/03/17  Yes Biagio Borg, MD  polyethylene glycol [facility] Floria Raveling) packet Take 17 g by mouth daily. 11/25/16   Rama, Venetia Maxon, MD    Family History Family History  Problem Relation Age of Onset  . Mental illness Father        suicide  . Arthritis Father   . Hyperlipidemia Sister   . Hypertension Sister   . Heart disease Brother        CAD/MI  . Hypertension Brother   . Coronary artery disease Brother   . Hypertension Unknown        family hx  . Heart attack Brother   . Colon cancer Neg Hx   . Breast cancer Neg Hx   .  Diabetes Neg Hx   . Stroke Neg Hx     Social History Social History  Substance Use Topics  . Smoking status: Former Smoker    Quit date: 12/08/1955  . Smokeless tobacco: Never Used     Comment: quit in 1995   . Alcohol use No     Allergies   Chocolate; Codeine; Diazepam; Fruit & vegetable daily [nutritional supplements]; Iohexol; Latex; Peach [prunus persica]; Peanut-containing drug products; Penicillins; Strawberry extract; Sulfonamide derivatives; Wheat; Wheat bran; Ambien [zolpidem tartrate]; Sulfamethoxazole; Crestor [rosuvastatin calcium]; and Iodine   Review of Systems Review of Systems  Constitutional: Positive for diaphoresis. Negative for chills and fever.  Respiratory: Positive for chest tightness and shortness of breath. Negative for cough.   Cardiovascular: Positive for chest pain. Negative for palpitations and leg swelling.  Gastrointestinal: Negative for abdominal pain, diarrhea, nausea and vomiting.  Genitourinary: Negative for dysuria, flank pain and pelvic pain.  Musculoskeletal: Negative for arthralgias, myalgias, neck pain and neck stiffness.  Skin: Negative for rash.  Neurological: Negative for  dizziness, weakness and headaches.  All other systems reviewed and are negative.    Physical Exam Updated Vital Signs BP (!) 159/60   Pulse 81   Temp 97.9 F (36.6 C) (Oral)   Resp 17   SpO2 95%   Physical Exam  Constitutional: She is oriented to person, place, and time. She appears well-developed and well-nourished. No distress.  HENT:  Head: Normocephalic.  Eyes: Conjunctivae are normal.  Neck: Neck supple.  Cardiovascular: Normal rate, regular rhythm and normal heart sounds.   Pulmonary/Chest: Effort normal and breath sounds normal. No respiratory distress. She has no wheezes. She has no rales.  Abdominal: Soft. Bowel sounds are normal. She exhibits no distension. There is no tenderness. There is no rebound.  Musculoskeletal: She exhibits no edema.  Neurological: She is alert and oriented to person, place, and time.  Skin: Skin is warm and dry.  Psychiatric: She has a normal mood and affect. Her behavior is normal.  Nursing note and vitals reviewed.    ED Treatments / Results  Labs (all labs ordered are listed, but only abnormal results are displayed) Labs Reviewed  BASIC METABOLIC PANEL - Abnormal; Notable for the following:       Result Value   Creatinine, Ser 1.08 (*)    Calcium 8.3 (*)    GFR calc non Af Amer 46 (*)    GFR calc Af Amer 53 (*)    All other components within normal limits  URINALYSIS, ROUTINE W REFLEX MICROSCOPIC - Abnormal; Notable for the following:    Color, Urine STRAW (*)    Specific Gravity, Urine 1.004 (*)    Leukocytes, UA SMALL (*)    Bacteria, UA RARE (*)    Squamous Epithelial / LPF 0-5 (*)    All other components within normal limits  TROPONIN I - Abnormal; Notable for the following:    Troponin I 0.03 (*)    All other components within normal limits  CBC  TROPONIN I  TROPONIN I  I-STAT TROPOININ, ED    EKG  EKG Interpretation  Date/Time:  Thursday April 14 2017 05:27:04 EDT Ventricular Rate:  89 PR Interval:    QRS  Duration: 93 QT Interval:  421 QTC Calculation: 513 R Axis:   99 Text Interpretation:  Atrial fibrillation Right axis deviation Anteroseptal infarct, old Borderline repolarization abnormality Prolonged QT interval Low voltage QRS When compared with ECG of 04/06/2017, Premature ventricular complexes are no longer present  Confirmed by Delora Fuel ([id number]) on 04/14/2017 6:38:40 AM       Radiology Dg Chest 2 View  Result Date: 04/14/2017 CLINICAL DATA:  Shortness of breath.  Chest pain . EXAM: CHEST  2 VIEW COMPARISON:  04/07/2017 .  Chest x-ray 04/06/2017. FINDINGS: Mediastinum and hilar structures are normal. Stable cardiomegaly. Mild Kerley B-lines and tiny pleural effusions suggesting mild CHF . Stable mild left base subsegmental atelectasis/ scarring. Right upper lung pulmonary nodular density not identified by chest x-ray, reference is made to prior recent chest x-ray 04/07/2017 for discussion of spiculated pulmonary nodule in the right apex. IMPRESSION: 1. Cardiomegaly with mild Kerley B-lines and tiny pleural effusions suggesting mild CHF. 2. Right upper lobe spiculated pulmonary nodular density not identified by chest x-ray, reference is made to prior chest x-ray of 04/07/2017 for further discussion of management of the right upper lung lesion. 3. Left base subsegmental atelectasis/ scarring again noted. No interim change. Electronically Signed   By: Marcello Moores  Register   On: 04/14/2017 06:26    Procedures Procedures (including critical care time)  Medications Ordered in ED Medications  aspirin chewable tablet 324 mg (not administered)     Initial Impression / Assessment and Plan / ED Course  I have reviewed the triage vital signs and the nursing notes.  Pertinent labs & imaging results that were available during my care of the patient were reviewed by me and considered in my medical decision making (see chart for details).     Pt in ED with CP that began this morning after walking  from the bathroom. Pain free after nitro. Will check labs, ECG, CXR  ECG shows afib, hx of the same, on eliquis. Labs unremarkable. CXR showing possible CHF. Will get pt admitted for ACS rule out.   8:05 AM Spoke with medicine will admit.  Vitals:   04/14/17 0915 04/14/17 0930 04/14/17 0945 04/14/17 1014  BP: (!) 145/61 (!) 147/52 136/63 (!) 149/73  Pulse: 65 71 61 76  Resp: 17 14 16 16   Temp:    98.3 F (36.8 C)  TempSrc:    Oral  SpO2: 94% 96% 95% 95%  Weight:    51.4 kg (113 lb 4.8 oz)  Height:    5\' 6"  (1.676 m)     Final Clinical Impressions(s) / ED Diagnoses   Final diagnoses:  Chest pain, unspecified type    New Prescriptions New Prescriptions   No medications on file     Jeannett Senior, PA-C 04/14/17 1601    Jeannett Senior, PA-C 52/84/13 2440    Delora Fuel, MD 08/13/24 [phone]

## 2017-04-14 NOTE — ED Notes (Signed)
Attempted report x1. 

## 2017-04-14 NOTE — Consult Note (Signed)
   [facility] CM Inpatient Consult   04/14/2017  Kayla Kent November 12, 1931 [id number]   Patient is new and recently active with [facility] for chronic disease management services in the Medicare ACO.  Was notified by [facility] of the patient's hospitalization.  Patient has been engaged by a [employer].  Our community based plan of care has focused on disease management and community resource support.  Patient will receive a post discharge transition of care call and will be evaluated for monthly home visits for assessments and disease process education.  Will follow up as this patient is currently under observation for chest pain.   Of note, [facility] Care Management services does not replace or interfere with any services that are needed or arranged by inpatient case management or social work.  For additional questions or referrals please contact:   Natividad Brood, RN BSN [facility] Liaison  [phone] business mobile phone Toll free office [phone]

## 2017-04-14 NOTE — ED Notes (Signed)
Patient denies pain and is resting comfortably.  

## 2017-04-15 ENCOUNTER — Encounter (HOSPITAL_COMMUNITY): Payer: Self-pay | Admitting: *Deleted

## 2017-04-15 ENCOUNTER — Other Ambulatory Visit: Payer: Self-pay | Admitting: Student

## 2017-04-15 DIAGNOSIS — I5033 Acute on chronic diastolic (congestive) heart failure: Secondary | ICD-10-CM | POA: Diagnosis not present

## 2017-04-15 DIAGNOSIS — I481 Persistent atrial fibrillation: Secondary | ICD-10-CM | POA: Diagnosis not present

## 2017-04-15 DIAGNOSIS — R55 Syncope and collapse: Secondary | ICD-10-CM

## 2017-04-15 DIAGNOSIS — R42 Dizziness and giddiness: Secondary | ICD-10-CM

## 2017-04-15 DIAGNOSIS — K649 Unspecified hemorrhoids: Secondary | ICD-10-CM | POA: Diagnosis not present

## 2017-04-15 DIAGNOSIS — Z79899 Other long term (current) drug therapy: Secondary | ICD-10-CM | POA: Diagnosis not present

## 2017-04-15 DIAGNOSIS — R918 Other nonspecific abnormal finding of lung field: Secondary | ICD-10-CM | POA: Diagnosis not present

## 2017-04-15 DIAGNOSIS — I208 Other forms of angina pectoris: Secondary | ICD-10-CM | POA: Diagnosis not present

## 2017-04-15 DIAGNOSIS — I480 Paroxysmal atrial fibrillation: Secondary | ICD-10-CM | POA: Diagnosis not present

## 2017-04-15 DIAGNOSIS — Z91010 Allergy to peanuts: Secondary | ICD-10-CM | POA: Diagnosis not present

## 2017-04-15 DIAGNOSIS — Z96641 Presence of right artificial hip joint: Secondary | ICD-10-CM | POA: Diagnosis not present

## 2017-04-15 DIAGNOSIS — R079 Chest pain, unspecified: Secondary | ICD-10-CM

## 2017-04-15 DIAGNOSIS — Z8673 Personal history of transient ischemic attack (TIA), and cerebral infarction without residual deficits: Secondary | ICD-10-CM | POA: Diagnosis not present

## 2017-04-15 DIAGNOSIS — I10 Essential (primary) hypertension: Secondary | ICD-10-CM | POA: Diagnosis not present

## 2017-04-15 DIAGNOSIS — E7800 Pure hypercholesterolemia, unspecified: Secondary | ICD-10-CM | POA: Diagnosis not present

## 2017-04-15 DIAGNOSIS — K219 Gastro-esophageal reflux disease without esophagitis: Secondary | ICD-10-CM

## 2017-04-15 DIAGNOSIS — I4819 Other persistent atrial fibrillation: Secondary | ICD-10-CM

## 2017-04-15 DIAGNOSIS — R0789 Other chest pain: Secondary | ICD-10-CM | POA: Diagnosis not present

## 2017-04-15 DIAGNOSIS — Z87891 Personal history of nicotine dependence: Secondary | ICD-10-CM | POA: Diagnosis not present

## 2017-04-15 DIAGNOSIS — Z91040 Latex allergy status: Secondary | ICD-10-CM | POA: Diagnosis not present

## 2017-04-15 DIAGNOSIS — R569 Unspecified convulsions: Secondary | ICD-10-CM | POA: Diagnosis not present

## 2017-04-15 DIAGNOSIS — I110 Hypertensive heart disease with heart failure: Secondary | ICD-10-CM | POA: Diagnosis not present

## 2017-04-15 LAB — BASIC METABOLIC PANEL
ANION GAP: 8 (ref 5–15)
BUN: 15 mg/dL (ref 6–20)
CALCIUM: 8.5 mg/dL — AB (ref 8.9–10.3)
CO2: 24 mmol/L (ref 22–32)
CREATININE: 1.07 mg/dL — AB (ref 0.44–1.00)
Chloride: 107 mmol/L (ref 101–111)
GFR, EST AFRICAN AMERICAN: 54 mL/min — AB (ref >60)
GFR, EST NON AFRICAN AMERICAN: 47 mL/min — AB (ref >60)
Glucose, Bld: 90 mg/dL (ref 65–99)
Potassium: 3.9 mmol/L (ref 3.5–5.1)
Sodium: 139 mmol/L (ref 135–145)

## 2017-04-15 LAB — CBC
HCT: 39.5 % (ref 36.0–46.0)
Hemoglobin: 12.8 g/dL (ref 12.0–15.0)
MCH: 29 pg (ref 26.0–34.0)
MCHC: 32.4 g/dL (ref 30.0–36.0)
MCV: 89.6 fL (ref 78.0–100.0)
PLATELETS: 307 10*3/uL (ref 150–400)
RBC: 4.41 MIL/uL (ref 3.87–5.11)
RDW: 15.4 % (ref 11.5–15.5)
WBC: 8.6 10*3/uL (ref 4.0–10.5)

## 2017-04-15 LAB — URINE CULTURE

## 2017-04-15 MED ORDER — PANTOPRAZOLE SODIUM 40 MG PO TBEC: 40.0000 mg | DELAYED_RELEASE_TABLET | Freq: Every day | ORAL | 1 refills | Status: DC

## 2017-04-15 NOTE — Care Management Obs Status (Signed)
[facility] NOTIFICATION   Patient Details  Name: WEI NEWBROUGH MRN: [id number] Date of Birth: 1931/11/25   Medicare Observation Status Notification Given:  Yes    Bethena Roys, RN 04/15/2017, 11:29 AM

## 2017-04-15 NOTE — Discharge Summary (Signed)
Physician Discharge Summary  Kayla Kent HBZ:[id number] DOB: 24-Oct-1931 DOA: 04/14/2017  PCP: Biagio Borg, MD  Admit date: 04/14/2017 Discharge date: 04/15/2017  Time spent: 30 minutes  Recommendations for Outpatient Follow-up:  1. Repeat BMET to follow up electrolytes and renal function  2. Please arrange follow up with pulmonary service, patient needs further assessment of right upper lung abnormalities 3. Follow up with cardiology service for further work up and treatment of her PAF/bradycardia and ongoing CP. Cardiology service has set appointments up for her.   Discharge Diagnoses:  Chest pain Mild troponin elevation HLD (hyperlipidemia) Essential hypertension Seizures (HCC) PAF (paroxysmal atrial fibrillation) (HCC) Hemorrhoids Constipation Mass of right lung Near syncope Gastroesophageal reflux disease   Discharge Condition: stable and improved. Will discharge home with instructions to follow up with PCP and with cardiology service as an outpatient.  Diet recommendation: heart healthy diet   Filed Weights   04/14/17 1014 04/15/17 0402  Weight: 51.4 kg (113 lb 4.8 oz) 50.9 kg (112 lb 4.8 oz)    History of present illness:  As per H&P written by Dr. Marily Memos on 04/14/17 83 y.o. female with medical history significant of Afib, anxiety, hypertension, hyperlipidemia, PVD. Patient presenting after single episode of what was described by the patient as "chest pressure. "  Episode began on the day of admission when patient was ambulating towards the bathroom. She developed substernal chest pressure which was associated with onset of shortness of breath and diaphoresis. Patient hit her medical alert button which did nothing because the batteries were dead. Patient then called her family members who called EMS. EMS administered nitroglycerin with complete resolution of the chest discomfort. Patient refused aspirin due to been on blood thinners. Patient never had an episode like  this before. Of note patient was recently admitted for sepsis due to a UTI and A. fib with RVR. Patient was admitted for chest pain r/o.  Hospital Course:  1-chest pain/discomfort: with heart score of 6 -patient EKG and telemetry w/o acute ischemic changes -troponin max 0.03 -now CP free and denying SOB -per cardiology recommendations will pursuit outpatient nuclear stress test  -will discharge on PPI  2-RUL mass -16 MM RUL nodule with spiculations (suspicious for bronchogenic Carcinoma) -needs outpatient follow up with pulmonary service  -might need 3 months f/u with PET-CT scan vs Bopsy  3-A. Fib: with description to cardiology of pre-syncope event overnight -found to be transiently bradycardic. -per cardiology recommendations will pursuit outpatient Holter monitoring  -continue diltiazem and Eliquis -CHADSVASC score 4  4-HTN -stable overall -asked to follow heart healthy diet -will ocntinue diltiazem and norvasc  5-anxiety: -will contnue xanax  6-hx of seizure -will continue keppra  7-chronic pain -will continue tramadol  8-constipation/hemorrhoids  -continue senokot/miralax -will also continue anusol  9-HLD -continue statins   Procedures:  See below for x-ray reports   Recent Echo: with preserved EF, no wall motion abnormalities and diastolic dysfunction with elevated ventricular end-diastolic filling pressure and elevated left atrial filling pressure.  Consultations:  Cardiology   Discharge Exam: Vitals:   04/14/17 1900 04/15/17 0402  BP: (!) 148/58 (!) 156/50  Pulse: 74 60  Resp:    Temp: 98.6 F (37 C) 98.6 F (37 C)    General: afebrile, no CP, no SOB, no nausea, no vomiting. Reports feeling slightly bradycardic and lightheaded overnight. But no symptoms now. Will like to go home. Cardiovascular: S1 and S2, no rubs, no gallops, soft murmur appreciated on exam. Respiratory: good air movement bilaterally,  no using accessory muscles, no crackles,  no wheezing Abd: soft, NT, ND, positive BS Extremities: no edema, no cyanosis   Discharge Instructions   Discharge Instructions    (HEART FAILURE PATIENTS) Call MD:  Anytime you have any of the following symptoms: 1) 3 pound weight gain in 24 hours or 5 pounds in 1 week 2) shortness of breath, with or without a dry hacking cough 3) swelling in the hands, feet or stomach 4) if you have to sleep on extra pillows at night in order to breathe.    Complete by:  As directed    Diet - low sodium heart healthy    Complete by:  As directed    Discharge instructions    Complete by:  As directed    Take medications as prescribed  Follow up with cardiology service as instructed Monitor daily weights Follow heart healthy diet Maintain adequate hydration  Arrange follow up with PCP in 1 week   Increase activity slowly    Complete by:  As directed      Current Discharge Medication List    START taking these medications   Details  pantoprazole (PROTONIX) 40 MG tablet Take 1 tablet (40 mg total) by mouth daily. Qty: 30 tablet, Refills: 1      CONTINUE these medications which have NOT CHANGED   Details  acetaminophen (TYLENOL) 500 MG tablet Take 1 tablet (500 mg total) by mouth every 6 (six) hours as needed for mild pain. Qty: 30 tablet, Refills: 0    ALPRAZolam (XANAX) 0.25 MG tablet TAKE ONE TABLET BY MOUTH TWICE DAILY AS NEEDED Qty: 60 tablet, Refills: 3    amLODipine (NORVASC) 2.5 MG tablet TAKE ONE TABLET BY MOUTH ONCE DAILY Qty: 90 tablet, Refills: 3    atorvastatin (LIPITOR) 10 MG tablet TAKE ONE TABLET BY MOUTH ONCE DAILY AT 6 PM Qty: 30 tablet, Refills: 10    diltiazem (DILACOR XR) 180 MG 24 hr capsule TAKE ONE CAPSULE BY MOUTH ONCE DAILY Qty: 90 capsule, Refills: 0    ELIQUIS 2.5 MG TABS tablet TAKE ONE TABLET BY MOUTH TWICE DAILY Qty: 180 tablet, Refills: 3    hydrocortisone (ANUSOL-HC) 25 MG suppository Place 1 suppository (25 mg total) rectally every 12 (twelve)  hours. Qty: 12 suppository, Refills: 1    levETIRAcetam (KEPPRA) 250 MG tablet TAKE ONE TABLET BY MOUTH TWICE DAILY Qty: 120 tablet, Refills: 5    senna-docusate (SENOKOT-S) 8.6-50 MG tablet Take 1 tablet by mouth 2 (two) times daily.    sodium chloride (OCEAN) 0.65 % SOLN nasal spray Place 1 spray into both nostrils as needed for congestion.    traMADol (ULTRAM) 50 MG tablet Take 1-2 tablets (50-100 mg total) by mouth every 6 (six) hours as needed for moderate pain or severe pain. Qty: 10 tablet, Refills: 0    traZODone (DESYREL) 50 MG tablet Take 0.5-1 tablets (25-50 mg total) by mouth at bedtime as needed for sleep. Qty: 90 tablet, Refills: 1    polyethylene glycol (MIRALAX / GLYCOLAX) packet Take 17 g by mouth daily. Qty: 14 each, Refills: 0       Allergies  Allergen Reactions  . Chocolate Hives  . Codeine Other (See Comments)    Patient states she acts crazy  . Diazepam Other (See Comments)    Patient states she acts crazy.  . Fruit & Vegetable Daily [Nutritional Supplements] Hives and Swelling    peaches  . Iohexol Hives     Code: HIVES, Desc: pt  gets 13 hr pre-meds, Onset Date: [id number]   . Latex Hives  . Peach [Prunus Persica] Hives  . Peanut-Containing Drug Products Hives and Swelling  . Penicillins Hives, Itching and Swelling    Has patient had a PCN reaction causing immediate rash, facial/tongue/throat swelling, SOB or lightheadedness with hypotension: Yes Has patient had a PCN reaction causing severe rash involving mucus membranes or skin necrosis: No Has patient had a PCN reaction that required hospitalization No Has patient had a PCN reaction occurring within the last 10 years: No If all of the above answers are "NO", then may proceed with Cephalosporin use.   . Strawberry Extract Hives  . Sulfonamide Derivatives Hives    nausea  . Wheat Shortness Of Breath    Shortness of breath  . Wheat Bran Shortness Of Breath  . Ambien [Zolpidem Tartrate] Other (See  Comments)    hallucinations  . Sulfamethoxazole Hives  . Crestor [Rosuvastatin Calcium] Rash  . Iodine Rash   Follow-up Information    Fay Records, MD Follow up on 04/22/2017.   Specialty:  Cardiology Why:  Appointment for stress test on 04/22/2017 at 9:15AM. Nothing to eat or drink after midnight leading up to the procedure.  Contact information: [place] [address] [phone]        Biagio Borg, MD. Schedule an appointment as soon as possible for a visit in 1 week(s).   Specialties:  Internal Medicine, Radiology Contact information: [place] [address] [phone]            The results of significant diagnostics from this hospitalization (including imaging, microbiology, ancillary and laboratory) are listed below for reference.    Significant Diagnostic Studies: Dg Chest 2 View  Result Date: 04/14/2017 CLINICAL DATA:  Shortness of breath.  Chest pain . EXAM: CHEST  2 VIEW COMPARISON:  04/07/2017 .  Chest x-ray 04/06/2017. FINDINGS: Mediastinum and hilar structures are normal. Stable cardiomegaly. Mild Kerley B-lines and tiny pleural effusions suggesting mild CHF . Stable mild left base subsegmental atelectasis/ scarring. Right upper lung pulmonary nodular density not identified by chest x-ray, reference is made to prior recent chest x-ray 04/07/2017 for discussion of spiculated pulmonary nodule in the right apex. IMPRESSION: 1. Cardiomegaly with mild Kerley B-lines and tiny pleural effusions suggesting mild CHF. 2. Right upper lobe spiculated pulmonary nodular density not identified by chest x-ray, reference is made to prior chest x-ray of 04/07/2017 for further discussion of management of the right upper lung lesion. 3. Left base subsegmental atelectasis/ scarring again noted. No interim change. Electronically Signed   By: Marcello Moores  Register   On: 04/14/2017 06:26   Ct Chest Wo Contrast  Result Date:  04/07/2017 CLINICAL DATA:  83 year old female with chest pain. UTI, possible sepsis. EXAM: CT CHEST WITHOUT CONTRAST TECHNIQUE: Multidetector CT imaging of the chest was performed following the standard protocol without IV contrast. COMPARISON:  Portable chest radiograph 04/06/2017. CT Abdomen and Pelvis 09/10/2008. FINDINGS: Cardiovascular: Cardiomegaly.  No pericardial effusion. Vascular patency is not evaluated in the absence of IV contrast. Calcified aortic atherosclerosis. Calcified coronary artery atherosclerosis. Mediastinum/Nodes: Mild mediastinal lymph node enlargement is nonspecific. There does appear to be asymmetric increased right hilar nodal tissue, although this is difficult to measure. Lungs/Pleura: Spiculated 16 mm right upper lobe lung nodule on series 8, image 50. Centrilobular emphysema. Mild generalize pulmonary septal thickening. Small to moderate size bilateral layering pleural effusions with some compressive atelectasis. Major airways remain  patent. Atelectasis or scarring in the left costophrenic angle. Upper Abdomen: Negative visualized noncontrast liver, spleen, pancreas, adrenal glands, kidneys, and bowel in the upper abdomen. Surgically absent gallbladder. Musculoskeletal: No acute or suspicious osseous lesion identified. IMPRESSION: 1. Spiculated 16 mm right upper lobe lung nodule highly suspicious for bronchogenic carcinoma. Consider one of the following: (a) repeat chest CT in 3 months, (b) follow-up PET-CT, or (c) tissue sampling. This recommendation follows the consensus statement: Guidelines for Management of Incidental Pulmonary Nodules Detected on CT Images: From the Fleischner Society 2017; Radiology 2017; 284:228-243. 2. Small to moderate layering pleural effusions. Possible mild pulmonary interstitial edema. 3. Cardiomegaly. 4. Aortic Atherosclerosis (ICD10-I70.0) and Emphysema (ICD10-J43.9). Electronically Signed   By: Genevie Ann M.D.   On: 04/07/2017 12:14   Dg Chest Port 1  View  Result Date: 04/06/2017 CLINICAL DATA:  RIGHT side chest pain, sepsis, UTI, fever, new altered mental status EXAM: PORTABLE CHEST 1 VIEW COMPARISON:  Portable exam 1949 hours compared to 11/20/2016 FINDINGS: Enlargement of cardiac silhouette with pulmonary vascular congestion. Atherosclerotic calcification aorta. Emphysematous changes with scattered mild interstitial prominence throughout both lungs new since previous exam question mild pulmonary edema. No pleural effusion or pneumothorax. Diffuse osseous demineralization. IMPRESSION: Enlargement of cardiac silhouette with pulmonary vascular congestion and new interstitial infiltrates question pulmonary edema. Aortic Atherosclerosis (ICD10-I70.0) and Emphysema (ICD10-J43.9). Electronically Signed   By: Lavonia Dana M.D.   On: 04/06/2017 20:02   Ct Head Code Stroke W/o Cm  Result Date: 04/06/2017 CLINICAL DATA:  Code stroke. Unsteady gait. Code stroke. History of hypertension, hyperlipidemia. EXAM: CT HEAD WITHOUT CONTRAST TECHNIQUE: Contiguous axial images were obtained from the base of the skull through the vertex without intravenous contrast. COMPARISON:  CT HEAD November 27, 2016 FINDINGS: BRAIN: No intraparenchymal hemorrhage, mass effect nor midline shift. The ventricles and sulci are normal for age. Patchy supratentorial white matter hypodensities less than expected for patient's age, though non-specific are most compatible with chronic small vessel ischemic disease. RIGHT temporal lobe encephalomalacia. Old RIGHT caudate head lacunar infarct. No acute large vascular territory infarcts. No abnormal extra-axial fluid collections. Basal cisterns are patent. VASCULAR: Mild calcific atherosclerosis of the carotid siphons. SKULL: No skull fracture. Chronic deformity LEFT mandible condyles. No significant scalp soft tissue swelling. SINUSES/ORBITS: Trace mastoid effusions. Paranasal sinuses are well-aerated. The included ocular globes and orbital contents  are non-suspicious. Status post LEFT ocular lens implant. OTHER: None. ASPECTS [facility] Stroke Program Early CT Score) - Ganglionic level infarction (caudate, lentiform nuclei, internal capsule, insula, M1-M3 cortex): 7 - Supraganglionic infarction (M4-M6 cortex): 3 Total score (0-10 with 10 being normal): 10 IMPRESSION: 1. No acute intracranial process. Stable examination including old RIGHT basal ganglia lacunar infarct and RIGHT temporal lobe encephalomalacia. 2. ASPECTS is 10. Critical Value/emergent results were called by telephone at the time of interpretation on 04/06/2017 at 7:25 pm to Dr. Cheral Marker, Neurology , who verbally acknowledged these results. Electronically Signed   By: Elon Alas M.D.   On: 04/06/2017 19:26    Microbiology: Recent Results (from the past 240 hour(s))  Urine culture     Status: None   Collection Time: 04/06/17  7:44 PM  Result Value Ref Range Status   Specimen Description URINE, CATHETERIZED  Final   Special Requests NONE  Final   Culture NO GROWTH  Final   Report Status 04/08/2017 FINAL  Final  Blood Culture (routine x 2)     Status: None   Collection Time: 04/06/17  8:03 PM  Result Value  Ref Range Status   Specimen Description BLOOD LEFT HAND  Final   Special Requests IN PEDIATRIC BOTTLE Blood Culture adequate volume  Final   Culture NO GROWTH 5 DAYS  Final   Report Status 04/11/2017 FINAL  Final  Blood Culture (routine x 2)     Status: None   Collection Time: 04/06/17  8:07 PM  Result Value Ref Range Status   Specimen Description BLOOD RIGHT ANTECUBITAL  Final   Special Requests   Final    BOTTLES DRAWN AEROBIC AND ANAEROBIC Blood Culture adequate volume   Culture NO GROWTH 5 DAYS  Final   Report Status 04/11/2017 FINAL  Final  Urine Culture     Status: None   Collection Time: 04/13/17  8:39 AM  Result Value Ref Range Status   Organism ID, Bacteria   Final    Single organism less than 10,000 CFU/mL isolated. These organisms,commonly found on  external and internal genitalia,are considered colonizers. No further testing performed.      Labs: Basic Metabolic Panel:  Recent Labs Lab 04/09/17 0238 04/13/17 0839 04/14/17 0541 04/15/17 0344  NA 139 139 141 139  K 4.0 4.0 3.6 3.9  CL 108 105 109 107  CO2 25 26 23 24   GLUCOSE 84 190* 99 90  BUN 17 15 18 15   CREATININE 0.97 1.12 1.08* 1.07*  CALCIUM 8.3* 9.0 8.3* 8.5*  MG 2.0  --   --   --    Liver Function Tests:  Recent Labs Lab 04/13/17 0839  AST 16  ALT 26  ALKPHOS 118*  BILITOT 0.7  PROT 6.8  ALBUMIN 3.6   CBC:  Recent Labs Lab 04/09/17 0238 04/13/17 0839 04/14/17 0541 04/15/17 0344  WBC 5.3 9.6 8.5 8.6  NEUTROABS  --  5.8  --   --   HGB 11.5* 13.9 12.6 12.8  HCT 36.5 41.9 39.5 39.5  MCV 90.8 90.1 90.2 89.6  PLT 197 325.0 293 307   Cardiac Enzymes:  Recent Labs Lab 04/14/17 1017 04/14/17 1331 04/14/17 1534  TROPONINI 0.03* <0.03 0.03*    ProBNP (last 3 results)  Recent Labs  10/22/16 1135  PROBNP 410.0*    Signed:  Barton Dubois MD.  [facility] 04/15/2017, 10:14 AM

## 2017-04-15 NOTE — Consult Note (Signed)
Cardiology Consult    Patient ID: [name] MRN: [id number], DOB/AGE: 81/08/1932   Admit date: 04/14/2017 Date of Consult: 04/15/2017  Primary Physician: Biagio Borg, MD Reason for Consult: Chest Pain  Primary Cardiologist: Dr. Harrington Challenger Requesting Provider: Dr. Dyann Kief  History of Present Illness    Kayla Kent is a 83 y.o. female with past medical history of PAF (on Eliquis), HTN, and HLD who is being seen today for the evaluation of chest pain at the request of Dr. Dyann Kief.  She was last examined by Dr. Harrington Challenger on 03/18/2017 and reported undergoing recent surgery for a hip fracture but was overall progressing well. She denied any recent chest pain, dyspnea on exertion, or palpitations. She was continued on Eliquis for anticoagulation along with both Amlodipine and Cardizem CD (addressed at that time and while not optimal, was continued on both).   In the interim, she was admitted from 6/20 - 04/09/2017 for sepsis with associated metabolic encephalopathy, thought to be secondary to a UTI. Reports her urinary symptoms have since resolved.   She presented back to [facility] ED on 04/14/2017 for sudden onset chest pain which started at the time of going to the restroom. She reports awaking two nights ago to go to the restroom. After finishing, she had a glass of water and then returned to bed. After lying down, she developed a pressure along her sternum. The pain lasted for 20+ minutes, therefore she called her daughter and son, who then called EMS. Upon their arrival, she was given SL NTG with resolution of her symptoms. No associated dyspnea, nausea, vomiting, or diaphoresis. She denies any recurrent symptoms since. She does participate in an exercise class daily and denies any anginal symptoms with this. She performs household chores and landscaping without any symptoms as well.   Initial labs show WBC of 8.5, Hgb 12.6, platelets 293. K+ 3.6, creatinine 1.08. Troponin values have been flat at  0.03 (down from 0.08 during previous admission). EKG shows atrial fibrillation, HR 61, with no acute ST or T-wave changes.  A recent echocardiogram was obtained on 6/22 and showed a preserved EF of 55-60% with trivial AI and mild MR. A PFO was identified along with a trivial pericardial effusion.    Past Medical History   Past Medical History:  Diagnosis Date  . Allergic rhinitis   . Anxiety   . HLD (hyperlipidemia)   . HTN (hypertension)   . Intolerance of drug    orthostatic  . Paroxysmal supraventricular tachycardia ([place])   . PVD (peripheral vascular disease) ([place])   . Seizure ([place])   . Syncope and collapse   . Uterine prolapse without mention of vaginal wall prolapse     Past Surgical History:  Procedure Laterality Date  . CARDIOVERSION N/A 11/05/2014   Procedure: CARDIOVERSION;  Surgeon: Candee Furbish, MD;  Location: [facility] ENDOSCOPY;  Service: Cardiovascular;  Laterality: N/A;  . cataract surgery  08/2015  . CHOLECYSTECTOMY    . corrective eye surgery     as a child  . INTRAMEDULLARY (IM) NAIL INTERTROCHANTERIC Left 11/21/2016   Procedure: INTRAMEDULLARY (IM) NAIL INTERTROCHANTRIC HEMI;  Surgeon: Newt Minion, MD;  Location: [place];  Service: Orthopedics;  Laterality: Left;  . IVD removed    . OPEN REDUCTION INTERNAL FIXATION (ORIF) DISTAL RADIAL FRACTURE Right 08/29/2014   Procedure: OPEN REDUCTION INTERNAL FIXATION (ORIF) DISTAL RADIAL FRACTURE;  Surgeon: Marianna Payment, MD;  Location: [place];  Service: Orthopedics;  Laterality: Right;  .  RF ablation PSVT     summer '10  . stress cardiolite  08/05/93  . TONSILLECTOMY    . TOTAL HIP ARTHROPLASTY Right 08/29/2014   Procedure: Right Hip Hemi Arthroplasty;  Surgeon: Marianna Payment, MD;  Location: [place];  Service: Orthopedics;  Laterality: Right;  Hip procedure 1st wants Peg Board, [employer], [place].      Allergies  Allergies  Allergen Reactions  . Chocolate Hives  . Codeine Other (See Comments)    Patient  states she acts crazy  . Diazepam Other (See Comments)    Patient states she acts crazy.  . Fruit & Vegetable Daily [Nutritional Supplements] Hives and Swelling    peaches  . Iohexol Hives     Code: HIVES, Desc: pt gets 13 hr pre-meds, Onset Date: [id number]   . Latex Hives  . Peach [Prunus Persica] Hives  . Peanut-Containing Drug Products Hives and Swelling  . Penicillins Hives, Itching and Swelling    Has patient had a PCN reaction causing immediate rash, facial/tongue/throat swelling, SOB or lightheadedness with hypotension: Yes Has patient had a PCN reaction causing severe rash involving mucus membranes or skin necrosis: No Has patient had a PCN reaction that required hospitalization No Has patient had a PCN reaction occurring within the last 10 years: No If all of the above answers are "NO", then may proceed with Cephalosporin use.   . Strawberry Extract Hives  . Sulfonamide Derivatives Hives    nausea  . Wheat Shortness Of Breath    Shortness of breath  . Wheat Bran Shortness Of Breath  . Ambien [Zolpidem Tartrate] Other (See Comments)    hallucinations  . Sulfamethoxazole Hives  . Crestor [Rosuvastatin Calcium] Rash  . Iodine Rash    Inpatient Medications    . amLODipine  2.5 mg Oral Daily  . apixaban  2.5 mg Oral BID  . aspirin  324 mg Oral Once  . atorvastatin  10 mg Oral q1800  . diltiazem  180 mg Oral Daily  . hydrocortisone  25 mg Rectal Q12H  . levETIRAcetam  250 mg Oral BID  . pantoprazole  40 mg Oral Daily  . senna-docusate  1 tablet Oral BID  . sodium chloride flush  3 mL Intravenous Q12H    Family History    Family History  Problem Relation Age of Onset  . Mental illness Father        suicide  . Arthritis Father   . Hyperlipidemia Sister   . Hypertension Sister   . Heart disease Brother        CAD/MI  . Hypertension Brother   . Coronary artery disease Brother   . Hypertension Unknown        family hx  . Heart attack Brother   . Colon cancer  Neg Hx   . Breast cancer Neg Hx   . Diabetes Neg Hx   . Stroke Neg Hx     Social History    Social History   Social History  . Marital status: Widowed    Spouse name: N/A  . Number of children: 2  . Years of education: 63   Occupational History  . retired Radio producer    Social History Main Topics  . Smoking status: Former Smoker    Quit date: 12/08/1955  . Smokeless tobacco: Never Used     Comment: quit in 1995   . Alcohol use No  . Drug use: No  . Sexual activity: No   Other  Topics Concern  . Not on file   Social History Narrative   [employer], [employer]-BA, [employer]-early childhood. Married '55 -9 years.  1 son - 31; 1 daughter - 39; 3 grandchildren . Lives alone. ACP - discussed and provided packet on end of life care (Feb '13)   Patient is now widowed.   Patient is right-handed.   Patient drinks tea daily.     Review of Systems    General:  No chills, fever, night sweats or weight changes.  Cardiovascular:  No dyspnea on exertion, edema, orthopnea, palpitations, paroxysmal nocturnal dyspnea. Positive for chest pain.  Dermatological: No rash, lesions/masses Respiratory: No cough, dyspnea Urologic: No hematuria, dysuria Abdominal:   No nausea, vomiting, diarrhea, bright red blood per rectum, melena, or hematemesis Neurologic:  No visual changes, wkns, changes in mental status. All other systems reviewed and are otherwise negative except as noted above.  Physical Exam    Blood pressure (!) 156/50, pulse 60, temperature 98.6 F (37 C), temperature source Oral, resp. rate 16, height 5\' 6"  (1.676 m), weight 112 lb 4.8 oz (50.9 kg), SpO2 95 %.  General: Pleasant, elderly Caucasian female appearing in NAD Psych: Normal affect. Neuro: Alert and oriented X 3. Moves all extremities spontaneously. HEENT: Normal  Neck: Supple without bruits or JVD. Lungs:  Resp regular and unlabored, CTA without wheezing or rales. Heart: Irregularly irregular, no s3, s4, or  murmurs. Abdomen: Soft, non-tender, non-distended, BS + x 4.  Extremities: No clubbing, cyanosis or edema. DP/PT/Radials 2+ and equal bilaterally.  Labs    Troponin [facility] of Care Test)  Recent Labs  04/14/17 0610  TROPIPOC 0.03    Recent Labs  04/14/17 1017 04/14/17 1331 04/14/17 1534  TROPONINI 0.03* <0.03 0.03*   Lab Results  Component Value Date   WBC 8.6 04/15/2017   HGB 12.8 04/15/2017   HCT 39.5 04/15/2017   MCV 89.6 04/15/2017   PLT 307 04/15/2017    Recent Labs Lab 04/13/17 0839  04/15/17 0344  NA 139  < > 139  K 4.0  < > 3.9  CL 105  < > 107  CO2 26  < > 24  BUN 15  < > 15  CREATININE 1.12  < > 1.07*  CALCIUM 9.0  < > 8.5*  PROT 6.8  --   --   BILITOT 0.7  --   --   ALKPHOS 118*  --   --   ALT 26  --   --   AST 16  --   --   GLUCOSE 190*  < > 90  < > = values in this interval not displayed. Lab Results  Component Value Date   CHOL 132 10/30/2015   HDL 33 (L) 10/30/2015   LDLCALC 85 10/30/2015   TRIG 71 10/30/2015   Lab Results  Component Value Date   DDIMER  07/16/2009    0.46        AT THE INHOUSE ESTABLISHED CUTOFF VALUE OF 0.48 ug/mL FEU, THIS ASSAY HAS BEEN DOCUMENTED IN THE LITERATURE TO HAVE A SENSITIVITY AND NEGATIVE PREDICTIVE VALUE OF AT LEAST 98 TO 99%.  THE TEST RESULT SHOULD BE CORRELATED WITH AN ASSESSMENT OF THE CLINICAL PROBABILITY OF DVT / VTE.     Radiology Studies    Dg Chest 2 View  Result Date: 04/14/2017 CLINICAL DATA:  Shortness of breath.  Chest pain . EXAM: CHEST  2 VIEW COMPARISON:  04/07/2017 .  Chest x-ray 04/06/2017. FINDINGS: Mediastinum and hilar  structures are normal. Stable cardiomegaly. Mild Kerley B-lines and tiny pleural effusions suggesting mild CHF . Stable mild left base subsegmental atelectasis/ scarring. Right upper lung pulmonary nodular density not identified by chest x-ray, reference is made to prior recent chest x-ray 04/07/2017 for discussion of spiculated pulmonary nodule in the right  apex. IMPRESSION: 1. Cardiomegaly with mild Kerley B-lines and tiny pleural effusions suggesting mild CHF. 2. Right upper lobe spiculated pulmonary nodular density not identified by chest x-ray, reference is made to prior chest x-ray of 04/07/2017 for further discussion of management of the right upper lung lesion. 3. Left base subsegmental atelectasis/ scarring again noted. No interim change. Electronically Signed   By: Marcello Moores  Register   On: 04/14/2017 06:26   Ct Chest Wo Contrast  Result Date: 04/07/2017 CLINICAL DATA:  83 year old female with chest pain. UTI, possible sepsis. EXAM: CT CHEST WITHOUT CONTRAST TECHNIQUE: Multidetector CT imaging of the chest was performed following the standard protocol without IV contrast. COMPARISON:  Portable chest radiograph 04/06/2017. CT Abdomen and Pelvis 09/10/2008. FINDINGS: Cardiovascular: Cardiomegaly.  No pericardial effusion. Vascular patency is not evaluated in the absence of IV contrast. Calcified aortic atherosclerosis. Calcified coronary artery atherosclerosis. Mediastinum/Nodes: Mild mediastinal lymph node enlargement is nonspecific. There does appear to be asymmetric increased right hilar nodal tissue, although this is difficult to measure. Lungs/Pleura: Spiculated 16 mm right upper lobe lung nodule on series 8, image 50. Centrilobular emphysema. Mild generalize pulmonary septal thickening. Small to moderate size bilateral layering pleural effusions with some compressive atelectasis. Major airways remain patent. Atelectasis or scarring in the left costophrenic angle. Upper Abdomen: Negative visualized noncontrast liver, spleen, pancreas, adrenal glands, kidneys, and bowel in the upper abdomen. Surgically absent gallbladder. Musculoskeletal: No acute or suspicious osseous lesion identified. IMPRESSION: 1. Spiculated 16 mm right upper lobe lung nodule highly suspicious for bronchogenic carcinoma. Consider one of the following: (a) repeat chest CT in 3 months,  (b) follow-up PET-CT, or (c) tissue sampling. This recommendation follows the consensus statement: Guidelines for Management of Incidental Pulmonary Nodules Detected on CT Images: From the Fleischner Society 2017; Radiology 2017; 284:228-243. 2. Small to moderate layering pleural effusions. Possible mild pulmonary interstitial edema. 3. Cardiomegaly. 4. Aortic Atherosclerosis (ICD10-I70.0) and Emphysema (ICD10-J43.9). Electronically Signed   By: Genevie Ann M.D.   On: 04/07/2017 12:14   Dg Chest Port 1 View  Result Date: 04/06/2017 CLINICAL DATA:  RIGHT side chest pain, sepsis, UTI, fever, new altered mental status EXAM: PORTABLE CHEST 1 VIEW COMPARISON:  Portable exam 1949 hours compared to 11/20/2016 FINDINGS: Enlargement of cardiac silhouette with pulmonary vascular congestion. Atherosclerotic calcification aorta. Emphysematous changes with scattered mild interstitial prominence throughout both lungs new since previous exam question mild pulmonary edema. No pleural effusion or pneumothorax. Diffuse osseous demineralization. IMPRESSION: Enlargement of cardiac silhouette with pulmonary vascular congestion and new interstitial infiltrates question pulmonary edema. Aortic Atherosclerosis (ICD10-I70.0) and Emphysema (ICD10-J43.9). Electronically Signed   By: Lavonia Dana M.D.   On: 04/06/2017 20:02   Ct Head Code Stroke W/o Cm  Result Date: 04/06/2017 CLINICAL DATA:  Code stroke. Unsteady gait. Code stroke. History of hypertension, hyperlipidemia. EXAM: CT HEAD WITHOUT CONTRAST TECHNIQUE: Contiguous axial images were obtained from the base of the skull through the vertex without intravenous contrast. COMPARISON:  CT HEAD November 27, 2016 FINDINGS: BRAIN: No intraparenchymal hemorrhage, mass effect nor midline shift. The ventricles and sulci are normal for age. Patchy supratentorial white matter hypodensities less than expected for patient's age, though non-specific are most compatible with chronic  small vessel  ischemic disease. RIGHT temporal lobe encephalomalacia. Old RIGHT caudate head lacunar infarct. No acute large vascular territory infarcts. No abnormal extra-axial fluid collections. Basal cisterns are patent. VASCULAR: Mild calcific atherosclerosis of the carotid siphons. SKULL: No skull fracture. Chronic deformity LEFT mandible condyles. No significant scalp soft tissue swelling. SINUSES/ORBITS: Trace mastoid effusions. Paranasal sinuses are well-aerated. The included ocular globes and orbital contents are non-suspicious. Status post LEFT ocular lens implant. OTHER: None. ASPECTS [facility] Stroke Program Early CT Score) - Ganglionic level infarction (caudate, lentiform nuclei, internal capsule, insula, M1-M3 cortex): 7 - Supraganglionic infarction (M4-M6 cortex): 3 Total score (0-10 with 10 being normal): 10 IMPRESSION: 1. No acute intracranial process. Stable examination including old RIGHT basal ganglia lacunar infarct and RIGHT temporal lobe encephalomalacia. 2. ASPECTS is 10. Critical Value/emergent results were called by telephone at the time of interpretation on 04/06/2017 at 7:25 pm to Dr. Cheral Marker, Neurology , who verbally acknowledged these results. Electronically Signed   By: Elon Alas M.D.   On: 04/06/2017 19:26    EKG & Cardiac Imaging    EKG: Atrial fibrillation, HR 61, with no acute ST or T-wave changes - Personally Reviewed  Echocardiogram: 04/08/2017 Study Conclusions  - Left ventricle: The cavity size was normal. Wall thickness was   normal. Systolic function was normal. The estimated ejection   fraction was in the range of 55% to 60%. Doppler parameters are   consistent with both elevated ventricular end-diastolic filling   pressure and elevated left atrial filling pressure. - Aortic valve: There was trivial regurgitation. - Mitral valve: There was mild regurgitation. Valve area by   continuity equation (using LVOT flow): 1.22 cm^2. - Right atrium: The atrium was mildly  dilated. - Atrial septum: There was a patent foramen ovale. - Pulmonary arteries: PA peak pressure: 49 mm Hg (S). - Pericardium, extracardiac: A trivial pericardial effusion was   identified.  Assessment & Plan    1. Atypical Chest Pain - she reports developing sudden onset chest pain after consuming a glass of water during the middle of the night and lying down right after this. Her pain lasted for 20+ minutes then resolved with SL NTG. No associated dyspnea, nausea, vomiting, or diaphoresis. She denies any recurrent symptoms since.  - she has no known history of CAD. She does participate in exercise classes daily and denies any anginal symptoms with this.  - Troponin values have been flat at 0.03 (down from 0.08 during previous admission). EKG shows atrial fibrillation, HR 61, with no acute ST or T-wave changes. Recent echo just last week showed a preserved EF of 55-60% with trivial AI and mild MR. A PFO was identified. - would consider an outpatient NST as she denies any recurrent symptoms and overall her presentation seems atypical for a cardiac etiology.   2. Paroxysmal Atrial Fibrillation - continue Eliquis for anticoagulation. She denies any evidence of active bleeding.  - continue Cardizem CD 180mg  daily.   3. HTN - BP overall well-controlled this admission. - she remains on Amlodipine 2.5mg  daily and Cardizem CD 180mg  daily per instructions from Dr. Harrington Challenger.  4. HLD - continue Atorvastatin 10mg  daily.    Signed, Erma Heritage, PA-C 04/15/2017, 7:45 AM Pager: [phone]

## 2017-04-15 NOTE — Care Management Note (Signed)
Case Management Note  Patient Details  Name: Kayla Kent MRN: [id number] Date of Birth: 1932/05/20  Subjective/Objective:   Pt presented for Chest Pain. Pt is from home alone and PTA- active and independent. Pt has support of daughter.  PCP: Dr Cathlean Cower- has private insurance with Medicare/ BCBS with prescription drug coverage. Pt uses Product/process development scientist.               Action/Plan: No Home needs identified at this time.   Expected Discharge Date:  04/15/17               Expected Discharge Plan:  Home/Self Care  In-House Referral:  NA  Discharge planning Services  CM Consult  Post Acute Care Choice:  NA Choice offered to:  NA  DME Arranged:  N/A DME Agency:  NA  HH Arranged:  NA HH Agency:  NA  Status of Service:  Completed, signed off  If discussed at [place] of Stay Meetings, dates discussed:    Additional Comments:  Bethena Roys, RN 04/15/2017, 11:31 AM

## 2017-04-18 ENCOUNTER — Other Ambulatory Visit: Payer: Self-pay

## 2017-04-18 ENCOUNTER — Telehealth (HOSPITAL_COMMUNITY): Payer: Self-pay | Admitting: *Deleted

## 2017-04-18 NOTE — Telephone Encounter (Signed)
Patient given detailed instructions per Myocardial Perfusion Study Information Sheet for the test on 04/22/17 at 9:15. Patient notified to arrive 15 minutes early and that it is imperative to arrive on time for appointment to keep from having the test rescheduled.  If you need to cancel or reschedule your appointment, please call the office within 24 hours of your appointment. . Patient verbalized understanding.Kayla Kent

## 2017-04-18 NOTE — Patient Outreach (Signed)
[name] Care Management  04/18/17  Kayla Kent [birth year] [id number]  Attempted to reach patient for second TOC call without success. Also, patient called 24 hour nurse line because she was in the ED last week and call was listed on her discharge instructions for 10 am today.  Left HIPAA compliant voicemail with RNCM contact information and invited callback.  [name], RN, BSN, [place] Management Coordinator [phone]

## 2017-04-19 ENCOUNTER — Ambulatory Visit (INDEPENDENT_AMBULATORY_CARE_PROVIDER_SITE_OTHER): Payer: Medicare Other

## 2017-04-19 ENCOUNTER — Telehealth (HOSPITAL_COMMUNITY): Payer: Self-pay | Admitting: *Deleted

## 2017-04-19 ENCOUNTER — Telehealth: Payer: Self-pay | Admitting: Cardiology

## 2017-04-19 DIAGNOSIS — I4819 Other persistent atrial fibrillation: Secondary | ICD-10-CM

## 2017-04-19 DIAGNOSIS — I481 Persistent atrial fibrillation: Secondary | ICD-10-CM | POA: Diagnosis not present

## 2017-04-19 DIAGNOSIS — R42 Dizziness and giddiness: Secondary | ICD-10-CM

## 2017-04-19 NOTE — Telephone Encounter (Signed)
Received call from monitor company that pt had episode of PAF rates in the 70-80, now back in SR. In review of chart, she is on [place] with eliquis and diltiazem. Pt reported to be asymptomatic.   Reino Bellis, NP

## 2017-04-19 NOTE — Telephone Encounter (Signed)
Patient given detailed instructions per Myocardial Perfusion Study Information Sheet for the test on 04/22/17 at 0915. Patient notified to arrive 15 minutes early and that it is imperative to arrive on time for appointment to keep from having the test rescheduled.  If you need to cancel or reschedule your appointment, please call the office within 24 hours of your appointment. . Patient verbalized understanding.Kayla Kent, Ranae [place]

## 2017-04-21 ENCOUNTER — Other Ambulatory Visit: Payer: Self-pay

## 2017-04-21 NOTE — Progress Notes (Signed)
Cardiology Office Note    Date:  04/26/2017   ID:  Kayla Kent, DOB February 08, 1932, MRN [id number]  PCP:  Biagio Borg, MD  Cardiologist:   Dr. Harrington Challenger  Chief Complaint: Hospital follow up for chest pain   History of Present Illness:   Kayla Kent is a 83 y.o. female  with past medical history of PAF (on Eliquis), HTN, and HLD presents for hospital follow up.   She was admitted from 6/20 - 04/09/2017 for sepsis with associated metabolic encephalopathy, thought to be secondary to a UTI. Reports her urinary symptoms have since resolved.minimally elevated troponin felt demand ischemia.   Again admitted 6/28-6/29 for chest pain and palpitations. Mild elevated troponin. Overnight transient bradycardia. She was in afib.  Recommended outpatient stress test and 7 days monitor.   Stress test showed No evidence of ischemia. Potential blockage ?Marland Kitchen  Here today for follow up. Patient denies further episode of chest pain or shortness of breath. She goes to [employer] exercise 3 days per week. Asymptomatic during exercise. She has intermittent palpitation lasting for a few seconds. Denies orthopnea, PND, lower extremity edema, melena, syncope, dizziness or blood in her stool. Compliant with medication.     Past Medical History:  Diagnosis Date  . Allergic rhinitis   . Anxiety   . HLD (hyperlipidemia)   . HTN (hypertension)   . Intolerance of drug    orthostatic  . Paroxysmal supraventricular tachycardia ([place])   . PVD (peripheral vascular disease) ([place])   . Seizure ([place])   . Syncope and collapse   . Uterine prolapse without mention of vaginal wall prolapse     Past Surgical History:  Procedure Laterality Date  . CARDIOVERSION N/A 11/05/2014   Procedure: CARDIOVERSION;  Surgeon: Candee Furbish, MD;  Location: [facility] ENDOSCOPY;  Service: Cardiovascular;  Laterality: N/A;  . cataract surgery  08/2015  . CHOLECYSTECTOMY    . corrective eye surgery     as a child  . INTRAMEDULLARY (IM) NAIL  INTERTROCHANTERIC Left 11/21/2016   Procedure: INTRAMEDULLARY (IM) NAIL INTERTROCHANTRIC HEMI;  Surgeon: Newt Minion, MD;  Location: [place];  Service: Orthopedics;  Laterality: Left;  . IVD removed    . OPEN REDUCTION INTERNAL FIXATION (ORIF) DISTAL RADIAL FRACTURE Right 08/29/2014   Procedure: OPEN REDUCTION INTERNAL FIXATION (ORIF) DISTAL RADIAL FRACTURE;  Surgeon: Marianna Payment, MD;  Location: [place];  Service: Orthopedics;  Laterality: Right;  . RF ablation PSVT     summer '10  . stress cardiolite  08/05/93  . TONSILLECTOMY    . TOTAL HIP ARTHROPLASTY Right 08/29/2014   Procedure: Right Hip Hemi Arthroplasty;  Surgeon: Marianna Payment, MD;  Location: [place];  Service: Orthopedics;  Laterality: Right;  Hip procedure 1st wants Peg Board, [employer], [place].     Current Medications: Prior to Admission medications   Medication Sig Start Date End Date Taking? Authorizing Provider  acetaminophen (TYLENOL) 500 MG tablet Take 1 tablet (500 mg total) by mouth every 6 (six) hours as needed for mild pain. 11/22/16   Newt Minion, MD  ALPRAZolam Duanne Moron) 0.25 MG tablet TAKE ONE TABLET BY MOUTH TWICE DAILY AS NEEDED 03/22/17   Biagio Borg, MD  amLODipine (NORVASC) 2.5 MG tablet TAKE ONE TABLET BY MOUTH ONCE DAILY 04/01/17   Fay Records, MD  atorvastatin (LIPITOR) 10 MG tablet TAKE ONE TABLET BY MOUTH ONCE DAILY AT 6 PM 09/06/16   Fay Records, MD  diltiazem (DILACOR XR)  180 MG 24 hr capsule TAKE ONE CAPSULE BY MOUTH ONCE DAILY 02/23/17   Biagio Borg, MD  ELIQUIS 2.5 MG TABS tablet TAKE ONE TABLET BY MOUTH TWICE DAILY 10/08/16   Fay Records, MD  hydrocortisone (ANUSOL-HC) 25 MG suppository Place 1 suppository (25 mg total) rectally every 12 (twelve) hours. 03/03/17 03/03/18  Biagio Borg, MD  levETIRAcetam (KEPPRA) 250 MG tablet TAKE ONE TABLET BY MOUTH TWICE DAILY 11/08/16   Biagio Borg, MD  pantoprazole (PROTONIX) 40 MG tablet Take 1 tablet (40 mg total) by mouth daily. 04/15/17    Barton Dubois, MD  polyethylene glycol [facility] / Floria Raveling) packet Take 17 g by mouth daily. 11/25/16   Rama, Venetia Maxon, MD  senna-docusate (SENOKOT-S) 8.6-50 MG tablet Take 1 tablet by mouth 2 (two) times daily. 11/25/16   Rama, Venetia Maxon, MD  sodium chloride (OCEAN) 0.65 % SOLN nasal spray Place 1 spray into both nostrils as needed for congestion.    [provider]  traMADol (ULTRAM) 50 MG tablet Take 1-2 tablets (50-100 mg total) by mouth every 6 (six) hours as needed for moderate pain or severe pain. 11/25/16   Rama, Venetia Maxon, MD  traZODone (DESYREL) 50 MG tablet Take 0.5-1 tablets (25-50 mg total) by mouth at bedtime as needed for sleep. 03/03/17   Biagio Borg, MD    Allergies:   Chocolate; Codeine; Diazepam; Fruit & vegetable daily [nutritional supplements]; Iohexol; Latex; Peach [prunus persica]; Peanut-containing drug products; Penicillins; Strawberry extract; Sulfonamide derivatives; Wheat; Wheat bran; Ambien [zolpidem tartrate]; Sulfamethoxazole; Crestor [rosuvastatin calcium]; and Iodine   Social History   Social History  . Marital status: Widowed    Spouse name: N/A  . Number of children: 2  . Years of education: 25   Occupational History  . retired Radio producer    Social History Main Topics  . Smoking status: Former Smoker    Quit date: 12/08/1955  . Smokeless tobacco: Never Used     Comment: quit in 1995   . Alcohol use No  . Drug use: No  . Sexual activity: No   Other Topics Concern  . None   Social History Narrative   HSG, [employer]-BA, [employer] M[employer]-early childhood. Married '55 -9 years.  1 son - 64; 1 daughter - 9; 3 grandchildren . Lives alone. ACP - discussed and provided packet on end of life care (Feb '13)   Patient is now widowed.   Patient is right-handed.   Patient drinks tea daily.     Family History:  The patient's family history includes Arthritis in her father; Coronary artery disease in her brother; Heart attack in her  brother; Heart disease in her brother; Hyperlipidemia in her sister; Hypertension in her brother and sister; Mental illness in her father.   ROS:   Please see the history of present illness.    ROS All other systems reviewed and are negative.   PHYSICAL EXAM:   VS:  BP (!) 116/48   Pulse (!) 48   Ht 5\' 6"  (1.676 m)   Wt 112 lb 12.8 oz (51.2 kg)   BMI 18.21 kg/m    GEN: Well nourished, well developed, in no acute distress  HEENT: normal  Neck: no JVD, carotid bruits, or masses Cardiac: Irregularly irregular; no murmurs, rubs, or gallops,no edema  Respiratory:  clear to auscultation bilaterally, normal work of breathing GI: soft, nontender, nondistended, + BS MS: no deformity or atrophy  Skin: warm and dry, no rash Neuro:  Alert and Oriented x 3, Strength and sensation are intact Psych: euthymic mood, full affect  Wt Readings from Last 3 Encounters:  04/26/17 112 lb 12.8 oz (51.2 kg)  04/15/17 112 lb 4.8 oz (50.9 kg)  04/12/17 119 lb (54 kg)      Studies/Labs Reviewed:   EKG:  EKG is ordered today.  The ekg ordered today demonstrates Atrial fibrillation at rate of 49 bpm  Recent Labs: 10/22/2016: Pro B Natriuretic peptide (BNP) 410.0 04/06/2017: TSH 1.110 04/09/2017: Magnesium 2.0 04/13/2017: ALT 26 04/15/2017: BUN 15; Creatinine, Ser 1.07; Hemoglobin 12.8; Platelets 307; Potassium 3.9; Sodium 139   Lipid Panel    Component Value Date/Time   CHOL 132 10/30/2015 0931   TRIG 71 10/30/2015 0931   TRIG 141 10/25/2006 0906   HDL 33 (L) 10/30/2015 0931   CHOLHDL 4.0 10/30/2015 0931   VLDL 14 10/30/2015 0931   LDLCALC 85 10/30/2015 0931   LDLDIRECT 175.3 01/19/2008 1216    Additional studies/ records that were reviewed today include:   Echocardiogram: 04/08/17 Study Conclusions  - Left ventricle: The cavity size was normal. Wall thickness was   normal. Systolic function was normal. The estimated ejection   fraction was in the range of 55% to 60%. Doppler parameters  are   consistent with both elevated ventricular end-diastolic filling   pressure and elevated left atrial filling pressure. - Aortic valve: There was trivial regurgitation. - Mitral valve: There was mild regurgitation. Valve area by   continuity equation (using LVOT flow): 1.22 cm^2. - Right atrium: The atrium was mildly dilated. - Atrial septum: There was a patent foramen ovale. - Pulmonary arteries: PA peak pressure: 49 mm Hg (S). - Pericardium, extracardiac: A trivial pericardial effusion was   identified.    ASSESSMENT & PLAN:    1. Persistent atrial fibrillation - Rate is stable. She did had a bradycardia during her admission. Pending monitor evaluation to assess baseline rate. Continue Eliquis and CCB.  2. Hypertension - Blood pressure stable and well controlled on current regimen  3. Hyperlipidemia - Continue statin.  4. Chest pain - Resolved. Continue exercise regimen. Stress is without evidence of ischemia.     Medication Adjustments/Labs and Tests Ordered: Current medicines are reviewed at length with the patient today.  Concerns regarding medicines are outlined above.  Medication changes, Labs and Tests ordered today are listed in the Patient Instructions below. Patient Instructions  Medication Instructions:  Your physician recommends that you continue on your current medications as directed. Please refer to the Current Medication list given to you today.  Labwork: NONE  Testing/Procedures: NONE  Follow-Up: Your physician wants you to follow-up in: 3 months with Dr. Harrington Challenger.   If you need a refill on your cardiac medications before your next appointment, please call your pharmacy.       Jarrett Soho, [place]  04/26/2017 11:27 AM    [facility] [place], [place], [place]  [address] Phone: [phone]; Fax: [phone]

## 2017-04-21 NOTE — Patient Outreach (Signed)
[facility]) Care Management  04/21/17  Kayla Kent Sep 14, 1932 [id number]  Second attempt to reach patient without success. Left HIPAA compliant voicemail with RNCM contact information and invited callback.  [place] R. Tommy Minichiello, RN, BSN, [place] Management Coordinator [phone]

## 2017-04-22 ENCOUNTER — Ambulatory Visit (HOSPITAL_COMMUNITY): Payer: Medicare Other | Attending: Cardiovascular Disease

## 2017-04-22 ENCOUNTER — Other Ambulatory Visit: Payer: Self-pay

## 2017-04-22 DIAGNOSIS — R42 Dizziness and giddiness: Secondary | ICD-10-CM | POA: Diagnosis not present

## 2017-04-22 DIAGNOSIS — R079 Chest pain, unspecified: Secondary | ICD-10-CM | POA: Diagnosis not present

## 2017-04-22 LAB — MYOCARDIAL PERFUSION IMAGING
CHL CUP NUCLEAR SDS: 1
CHL CUP NUCLEAR SSS: 3
CSEPPHR: 83 {beats}/min
RATE: 0.37
Rest HR: 69 {beats}/min
SRS: 2
TID: 1.01

## 2017-04-22 MED ORDER — REGADENOSON 0.4 MG/5ML IV SOLN
0.4000 mg | Freq: Once | INTRAVENOUS | Status: AC
  Administered 2017-04-22: 0.4 mg via INTRAVENOUS

## 2017-04-22 MED ORDER — TECHNETIUM TC 99M TETROFOSMIN IV KIT
31.9000 | PACK | Freq: Once | INTRAVENOUS | Status: AC | PRN
  Administered 2017-04-22: 31.9 via INTRAVENOUS
  Filled 2017-04-22: qty 32

## 2017-04-22 MED ORDER — TECHNETIUM TC 99M TETROFOSMIN IV KIT
10.1000 | PACK | Freq: Once | INTRAVENOUS | Status: AC | PRN
  Administered 2017-04-22: 10.1 via INTRAVENOUS
  Filled 2017-04-22: qty 11

## 2017-04-22 NOTE — Patient Outreach (Signed)
[name]) Care Management  04/22/17  Kayla Kent [birth year] [id number]  Successful outreach completed with patient. Patient identification verified.  Patient stated that she had a stress test today.She stated she was grateful because she did not have to do the treadmill stress test, but just laid there and they took 2 sets of pictures.  She stated that Dr. Wyatt Haste (cardiologist) is reading the pictures and she should have the results sometime soon.   She denies any heart related problems since discharge home such as chest pain, shortness of breath or palpitations. She stated that she is wearing a heart monitor on her chest with a transmitter that is in her pocket. She stated she is wearing this for 14 days.   Patient also stated that she has a follow up appointment scheduled with the pulmonologist on July 10.   Patient stated that her family has gone to the mountains and she was supposed to go, but chose to stay home because it was supposed to be raining and she was told that the cellular coverage does not work well in [employer] and they have to have a good cell phone signal in order to pick up her heart monitor. She stated she is ok with the decision to stay back.   Patient has no other questions or concerns at present. She continues to do well and remains independent since discharge home.   Plan: Will follow up next week for continued transition of care.  [place] R. Tajah Schreiner, RN, BSN, [place] Management Coordinator [phone]

## 2017-04-25 ENCOUNTER — Telehealth: Payer: Self-pay | Admitting: Student

## 2017-04-25 NOTE — Telephone Encounter (Signed)
New message     Pt is returning call to [place] about results.

## 2017-04-25 NOTE — Telephone Encounter (Signed)
Returned pts call and she has been made aware of her stress test results and she verbalized understanding.

## 2017-04-26 ENCOUNTER — Other Ambulatory Visit: Payer: Self-pay

## 2017-04-26 ENCOUNTER — Ambulatory Visit (INDEPENDENT_AMBULATORY_CARE_PROVIDER_SITE_OTHER): Payer: Medicare Other | Admitting: Physician Assistant

## 2017-04-26 ENCOUNTER — Encounter: Payer: Self-pay | Admitting: Pulmonary Disease

## 2017-04-26 ENCOUNTER — Encounter: Payer: Self-pay | Admitting: Physician Assistant

## 2017-04-26 ENCOUNTER — Ambulatory Visit (INDEPENDENT_AMBULATORY_CARE_PROVIDER_SITE_OTHER): Payer: Medicare Other | Admitting: Pulmonary Disease

## 2017-04-26 VITALS — BP 118/64 | HR 50 | Ht 66.0 in | Wt 113.4 lb

## 2017-04-26 VITALS — BP 116/48 | HR 48 | Ht 66.0 in | Wt 112.8 lb

## 2017-04-26 DIAGNOSIS — E785 Hyperlipidemia, unspecified: Secondary | ICD-10-CM

## 2017-04-26 DIAGNOSIS — J90 Pleural effusion, not elsewhere classified: Secondary | ICD-10-CM | POA: Diagnosis not present

## 2017-04-26 DIAGNOSIS — I471 Supraventricular tachycardia: Secondary | ICD-10-CM

## 2017-04-26 DIAGNOSIS — R911 Solitary pulmonary nodule: Secondary | ICD-10-CM | POA: Diagnosis not present

## 2017-04-26 DIAGNOSIS — I481 Persistent atrial fibrillation: Secondary | ICD-10-CM

## 2017-04-26 DIAGNOSIS — I10 Essential (primary) hypertension: Secondary | ICD-10-CM | POA: Diagnosis not present

## 2017-04-26 DIAGNOSIS — IMO0001 Reserved for inherently not codable concepts without codable children: Secondary | ICD-10-CM

## 2017-04-26 DIAGNOSIS — R0789 Other chest pain: Secondary | ICD-10-CM | POA: Diagnosis not present

## 2017-04-26 DIAGNOSIS — J439 Emphysema, unspecified: Secondary | ICD-10-CM | POA: Insufficient documentation

## 2017-04-26 DIAGNOSIS — J432 Centrilobular emphysema: Secondary | ICD-10-CM

## 2017-04-26 DIAGNOSIS — I4819 Other persistent atrial fibrillation: Secondary | ICD-10-CM

## 2017-04-26 NOTE — Patient Instructions (Signed)
Medication Instructions:  Your physician recommends that you continue on your current medications as directed. Please refer to the Current Medication list given to you today.  Labwork: NONE  Testing/Procedures: NONE  Follow-Up: Your physician wants you to follow-up in: 3 months with Dr. Harrington Challenger.   If you need a refill on your cardiac medications before your next appointment, please call your pharmacy.

## 2017-04-26 NOTE — Patient Instructions (Addendum)
   Call me if you have any questions or concerns.  We will be in contact after you've had your PET CT scan.  TESTS ORDERED: 1. PET CT 2. Full PFTs before next appointment 3. 6MWT on room air before next appointment 4. Serum Alpha-1 Antitrypsin Phenotype

## 2017-04-26 NOTE — Progress Notes (Signed)
Subjective:    Patient ID: Kayla Kent, female    DOB: [birth year], 83 y.o.   MRN: [id number]  HPI Patient was recently admitted to hospital and underwent chest CT imaging that showed a 1.6cm nodule in her right upper lobe. She denies any dyspnea, coughing, or wheezing. No chest pain or pressure. No fever, chills, or sweats. No abdominal pain, nausea or emesis currently but does have occasional mid-afternoon nausea. No headaches. No focal vision loss that is new. No new focal weakness, numbness, or tingling. Doesn't see out of right eye at baseline. No headaches. No dysphagia or odynophagia. No dyspepsia but does have reflux occasionally relieved with pepto-bismol. No rashes but does bruise easily. No melena or hematochezia. No hematuria but does have a mild burning with urination. Has not had a colonoscopy or PAP smear recently. Hasn't had a mammogram in some time.    Review of Systems No adenopathy in her neck, groin, or axilla. A pertinent 14 point review of systems is negative except as per the history of presenting illness.  Allergies  Allergen Reactions  . Chocolate Hives  . Codeine Other (See Comments)    Patient states she acts crazy  . Diazepam Other (See Comments)    Patient states she acts crazy.  . Fruit & Vegetable Daily [Nutritional Supplements] Hives and Swelling    peaches  . Iohexol Hives     Code: HIVES, Desc: pt gets 13 hr pre-meds, Onset Date: [id number]   . Latex Hives  . Peach [Prunus Persica] Hives  . Peanut-Containing Drug Products Hives and Swelling  . Penicillins Hives, Itching and Swelling    Has patient had a PCN reaction causing immediate rash, facial/tongue/throat swelling, SOB or lightheadedness with hypotension: Yes Has patient had a PCN reaction causing severe rash involving mucus membranes or skin necrosis: No Has patient had a PCN reaction that required hospitalization No Has patient had a PCN reaction occurring within the last 10 years: No If all of  the above answers are "NO", then may proceed with Cephalosporin use.   . Strawberry Extract Hives  . Sulfonamide Derivatives Hives    nausea  . Wheat Shortness Of Breath    Shortness of breath  . Wheat Bran Shortness Of Breath  . Ambien [Zolpidem Tartrate] Other (See Comments)    hallucinations  . Sulfamethoxazole Hives  . Crestor [Rosuvastatin Calcium] Rash  . Iodine Rash    Current Outpatient Prescriptions on File Prior to Visit  Medication Sig Dispense Refill  . acetaminophen (TYLENOL) 500 MG tablet Take 1 tablet (500 mg total) by mouth every 6 (six) hours as needed for mild pain. 30 tablet 0  . ALPRAZolam (XANAX) 0.25 MG tablet TAKE ONE TABLET BY MOUTH TWICE DAILY AS NEEDED 60 tablet 3  . amLODipine (NORVASC) 2.5 MG tablet TAKE ONE TABLET BY MOUTH ONCE DAILY 90 tablet 3  . atorvastatin (LIPITOR) 10 MG tablet TAKE ONE TABLET BY MOUTH ONCE DAILY AT 6 PM 30 tablet 10  . diltiazem (DILACOR XR) 180 MG 24 hr capsule TAKE ONE CAPSULE BY MOUTH ONCE DAILY 90 capsule 0  . ELIQUIS 2.5 MG TABS tablet TAKE ONE TABLET BY MOUTH TWICE DAILY 180 tablet 3  . levETIRAcetam (KEPPRA) 250 MG tablet TAKE ONE TABLET BY MOUTH TWICE DAILY 120 tablet 5  . sodium chloride (OCEAN) 0.65 % SOLN nasal spray Place 1 spray into both nostrils as needed for congestion.    . traMADol (ULTRAM) 50 MG tablet Take 1-2 tablets (50-100 mg  total) by mouth every 6 (six) hours as needed for moderate pain or severe pain. 10 tablet 0  . traZODone (DESYREL) 50 MG tablet Take 0.5-1 tablets (25-50 mg total) by mouth at bedtime as needed for sleep. 90 tablet 1  . hydrocortisone (ANUSOL-HC) 25 MG suppository Place 1 suppository (25 mg total) rectally every 12 (twelve) hours. (Patient not taking: Reported on 04/26/2017) 12 suppository 1  . pantoprazole (PROTONIX) 40 MG tablet Take 1 tablet (40 mg total) by mouth daily. (Patient not taking: Reported on 04/26/2017) 30 tablet 1  . polyethylene glycol (MIRALAX / GLYCOLAX) packet Take 17 g by  mouth daily. (Patient not taking: Reported on 04/26/2017) 14 each 0  . senna-docusate (SENOKOT-S) 8.6-50 MG tablet Take 1 tablet by mouth 2 (two) times daily. (Patient not taking: Reported on 04/26/2017)     No current facility-administered medications on file prior to visit.     Past Medical History:  Diagnosis Date  . Allergic rhinitis   . Anxiety   . Emphysema of lung ([place])   . HLD (hyperlipidemia)   . HTN (hypertension)   . Intolerance of drug    orthostatic  . Paroxysmal supraventricular tachycardia ([place])   . PVD (peripheral vascular disease) ([place])   . Seizure ([place])   . Syncope and collapse   . Uterine prolapse without mention of vaginal wall prolapse     Past Surgical History:  Procedure Laterality Date  . CARDIOVERSION N/A 11/05/2014   Procedure: CARDIOVERSION;  Surgeon: Candee Furbish, MD;  Location[facility] ENDOSCOPY;  Service: Cardiovascular;  Laterality: N/A;  . cataract surgery  08/2015  . CHOLECYSTECTOMY    . corrective eye surgery     as a child  . INTRAMEDULLARY (IM) NAIL INTERTROCHANTERIC Left 11/21/2016   Procedure: INTRAMEDULLARY (IM) NAIL INTERTROCHANTRIC HEMI;  Surgeon: Newt Minion, MD;  Location: [facility];  Service: Orthopedics;  Laterality: Left;  . IVD removed    . OPEN REDUCTION INTERNAL FIXATION (ORIF) DISTAL RADIAL FRACTURE Right 08/29/2014   Procedure: OPEN REDUCTION INTERNAL FIXATION (ORIF) DISTAL RADIAL FRACTURE;  Surgeon: Marianna Payment, MD;  Location: [facility];  Service: Orthopedics;  Laterality: Right;  . RF ablation PSVT     summer '10  . stress cardiolite  08/05/93  . TONSILLECTOMY    . TOTAL HIP ARTHROPLASTY Right 08/29/2014   Procedure: Right Hip Hemi Arthroplasty;  Surgeon: Marianna Payment, MD;  Location: [facility];  Service: Orthopedics;  Laterality: Right;  Hip procedure 1st wants Peg Board, [employer], [place].     Family History  Problem Relation Age of Onset  . Mental illness Father        suicide  . Arthritis Father   . Hyperlipidemia  Sister   . Hypertension Sister   . Heart disease Brother        CAD/MI  . Hypertension Brother   . Hypertension Unknown        family hx  . Colon cancer Neg Hx   . Breast cancer Neg Hx   . Diabetes Neg Hx   . Stroke Neg Hx   . Cancer Neg Hx   . Lung disease Neg Hx     Social History   Social History  . Marital status: Widowed    Spouse name: N/A  . Number of children: 2  . Years of education: 69   Occupational History  . retired Radio producer    Social History Main Topics  . Smoking status: Former Smoker  Packs/day: 0.25    Years: 38.00    Start date: 02/15/1952    Quit date: 10/18/1993  . Smokeless tobacco: Never Used  . Alcohol use No  . Drug use: No  . Sexual activity: No   Other Topics Concern  . None   Social History Narrative   HSG, [employer]-BA, [employer] M[employer]-early childhood. Married '55 -33 years.  1 son - 51; 1 daughter - 33; 3 grandchildren . Lives alone. ACP - discussed and provided packet on end of life care (Feb '13)   Patient is now widowed.   Patient is right-handed.   Patient drinks tea daily.      [place] [facility] (04/26/17):   Originally from [facility]. Previously was a Pharmacist, hospital. No pets currently. No mold exposure. No bird exposure.       Objective:   Physical Exam BP 118/64 (BP Location: Left Arm, Patient Position: Sitting, Cuff Size: Normal)   Pulse (!) 50   Ht 5\' 6"  (1.676 m)   Wt 113 lb 6.4 oz (51.4 kg)   SpO2 97%   BMI 18.30 kg/m  General:  Awake. Alert. No acute distress. Elderly female. Integument:  Warm & dry. No rash on exposed skin.  Extremities:  No cyanosis or clubbing.  Lymphatics:  No appreciated cervical or supraclavicular lymphadenoapthy. HEENT:  Moist mucus membranes. No oral ulcers. No scleral injection or icterus.  Cardiovascular:  Regular rate with irregular rhythm. No edema. No appreciable JVD.  Pulmonary:  Good aeration & clear to auscultation bilaterally. Symmetric chest wall expansion. No accessory muscle use on  room air. Abdomen: Soft. Normal bowel sounds. Nondistended. Grossly nontender. Musculoskeletal:  Normal bulk and tone. Hand grip strength 5/5 bilaterally. No joint deformity or effusion appreciated. Neurological:  CN 2-12 grossly in tact. No meningismus. Moving all 4 extremities equally. Symmetric brachioradialis deep tendon reflexes. Psychiatric:  Mood and affect congruent. Speech normal rhythm, rate & tone.   IMAGING CXR PA/LAT 04/14/17 (personally reviewed by me):  Small bilateral pleural effusions suspected. Only evident on lateral imaging. Hyperinflation suggested by flattening of the diaphragms. Heart normal in size & mediastinum normal in contour. No parenchymal mass or nodule appreciated.  CT CHEST W/O 04/07/17 (personally reviewed by me): Centrally located right upper lobe 1.6 cm spiculated nodule. Subcentimeter groundglass nodule present within lateral segment right middle lobe as well. No other definite parenchymal opacity, mass, or nodule appreciated. Bilateral pleural effusions right greater than left with questionable mediastinal adenopathy which cannot be fully appreciated without contrast is disintegrating nature of the imaging. Mild apical centrilobular emphysema.  CARDIAC TTE (04/08/17):  LV normal in size with EF 55-60%. Elevated LV end-diastolic filling pressure. LA normal in size & RA mildly dilated. RV normal in size and function. No aortic stenosis with trivial regurgitation. Aortic root normal in size. Mild mitral regurgitation. Mild pulmonic and tricuspid regurgitation. Trivial pericardial effusion.    Assessment & Plan:  83 y.o. female with remote history of tobacco use which seems minimal. Patient does have centrilobular emphysema on chest imaging by my review as well as a 1.6 cm spiculated right upper lobe nodule which could represent an early malignancy. I reviewed the imaging with the patient today during her visit. Currently she is asymptomatic with regards to her emphysema  which is not surprising given the mild nature of it visually. We did discuss the potential need to biopsy this right upper lobe nodule if it is hypermetabolic or there are other signs of metastatic cancer on her PET/CT imaging. She  would like me to discuss this with her son Jeneen Rinks "Monica Martinez" Cassarino after PET/CT imaging is complete. I instructed her to contact me if she had any new breathing problems or questions before next appointment.  1. Right upper lobe nodule:  Checking PET CT scan. Further biopsy pending this result. 2. Emphysema: Screening for alpha-1 antitrypsin deficiency. Checking full pulmonary function testing and 6 minute walk test on room air before next appointment.  3. Bilateral pleural effusions:  Likely secondary to decompensated congestive heart failure.  4. Health maintenance: Status post Prevnar vaccine April 2015 & Pneumovax September 2010. 5. Follow-up: Return to clinic in 4 weeks or sooner if needed.  Sonia Baller Ashok Cordia, M.D. [facility] Pager:  [phone] After 3pm or if no response, call [phone] 2:06 PM 04/26/17

## 2017-04-26 NOTE — Patient Outreach (Signed)
[facility]) Care Management  04/26/17  BRIDIE COLQUHOUN Dec 24, 1931 [id number]  Attempted to reach patient for transition of care follow up without success. Left HIPAA compliant voicemail with RNCM contact information and invited callback.  [place] R. Viraj Liby, RN, BSN, [place] Management Coordinator [phone]

## 2017-05-04 ENCOUNTER — Other Ambulatory Visit: Payer: Self-pay

## 2017-05-04 NOTE — Patient Outreach (Signed)
[facility]) Care Management  05/04/17  Kayla Kent [birth year] [id number]  Second attempt to reach patient for transition of care follow up without success. Left HIPAA compliant voicemail with RNCM contact information and invited callback.  [name], RN, BSN, [place] Management Coordinator [phone]

## 2017-05-04 NOTE — Patient Outreach (Signed)
[facility]) Care Management  05/04/17  Kayla Kent July 25, 1932 [id number]  RNCM received inbound call from Kayla Kent, [place] Management Associate stating that patient had called the office to return RNCM call. She stated patient was present at her home number.  RNCM attempted to call patient without success. Left HIPAA compliant voicemail with RNCM contact information and advised it is RNCM direct number. Invited patient to call back.  Patient returned call within a few minutes of RNCM call. RNCM spoke with patient, patient identification verified.  Patient stated that she continues to do well since discharge, continues to drive, run errands, do grocery shopping with no problems. She stated that she has a-fib, but has not had any problems with her heart. She denies any chest pain, shortness of breath of palpitations at present.   Patient voiced a lot of frustration with the heart monitor that she was to wear for 14 days. She stated that she wore it for about 3 days after the nurse at [facility] placed the pads on her and she was then called by the heart monitor company and was told it was time to change the pads. She was not aware why she needed to change them, stating that they were still intact and she had followed all instructions on how to care for them including not taking a bath. She stated that she attempted to change the pads, but was never able to get them to work properly after this initial change. She stated that she had a friend help her apply them twice and they did not work/transmit any readings to the company and she attempted to contact [place], but was unable to locate anyone there who could assist her the day that she called. She stated that it was "so aggravating and frustrating" for her and when she talked with the monitor company, they did not seem to be aware of what was going on. However, they did instruct her to send everything back and  they are sending her out packaging to return it in that should arrive at her home tomorrow on the 18th.  She stated that she has not notified her heart doctor, Dr. Harrington Challenger of of not being able to complete wearing the monitor. She stated that Dr. Harrington Challenger is not the one who ordered it or put it on her and she did not want to bother her with it.  Patient currently has no other complaints or concerns at present. She has been adherent with discharge instructions, taking medications as prescribed and attending all appointments as scheduled. She remains independent and has no needs.   Plan: RNCM will notify cardiologist about inability to complete wearing heart monitor and patient agreeable. Will follow up next week for transition of care and address any concerns patient may have.   [name], RN, BSN, [place] Management Coordinator [phone]

## 2017-05-06 ENCOUNTER — Ambulatory Visit: Payer: Self-pay

## 2017-05-09 ENCOUNTER — Ambulatory Visit (INDEPENDENT_AMBULATORY_CARE_PROVIDER_SITE_OTHER): Payer: Medicare Other | Admitting: Neurology

## 2017-05-09 ENCOUNTER — Encounter: Payer: Self-pay | Admitting: Neurology

## 2017-05-09 VITALS — BP 144/63 | HR 70 | Ht 67.0 in | Wt 112.0 lb

## 2017-05-09 DIAGNOSIS — G40209 Localization-related (focal) (partial) symptomatic epilepsy and epileptic syndromes with complex partial seizures, not intractable, without status epilepticus: Secondary | ICD-10-CM

## 2017-05-09 MED ORDER — LEVETIRACETAM 250 MG PO TABS: 250.0000 mg | ORAL_TABLET | Freq: Two times a day (BID) | ORAL | 5 refills | Status: DC

## 2017-05-09 NOTE — Patient Instructions (Signed)
Levetiracetam tablets What is this medicine? LEVETIRACETAM (lee ve tye RA se tam) is an antiepileptic drug. It is used with other medicines to treat certain types of seizures. This medicine may be used for other purposes; ask your health care provider or pharmacist if you have questions. COMMON BRAND NAME(S): Keppra, Roweepra What should I tell my health care provider before I take this medicine? They need to know if you have any of these conditions: -kidney disease -suicidal thoughts, plans, or attempt; a previous suicide attempt by you or a family member -an unusual or allergic reaction to levetiracetam, other medicines, foods, dyes, or preservatives -pregnant or trying to get pregnant -breast-feeding How should I use this medicine? Take this medicine by mouth with a glass of water. Follow the directions on the prescription label. Swallow the tablets whole. Do not crush or chew this medicine. You may take this medicine with or without food. Take your doses at regular intervals. Do not take your medicine more often than directed. Do not stop taking this medicine or any of your seizure medicines unless instructed by your doctor or health care professional. Stopping your medicine suddenly can increase your seizures or their severity. A special MedGuide will be given to you by the pharmacist with each prescription and refill. Be sure to read this information carefully each time. Contact your pediatrician or health care professional regarding the use of this medication in children. While this drug may be prescribed for children as young as 4 years of age for selected conditions, precautions do apply. Overdosage: If you think you have taken too much of this medicine contact a poison control center or emergency room at once. NOTE: This medicine is only for you. Do not share this medicine with others. What if I miss a dose? If you miss a dose, take it as soon as you can. If it is almost time for your  next dose, take only that dose. Do not take double or extra doses. What may interact with this medicine? This medicine may interact with the following medications: -carbamazepine -colesevelam -probenecid -sevelamer This list may not describe all possible interactions. Give your health care provider a list of all the medicines, herbs, non-prescription drugs, or dietary supplements you use. Also tell them if you smoke, drink alcohol, or use illegal drugs. Some items may interact with your medicine. What should I watch for while using this medicine? Visit your doctor or health care professional for a regular check on your progress. Wear a medical identification bracelet or chain to say you have epilepsy, and carry a card that lists all your medications. It is important to take this medicine exactly as instructed by your health care professional. When first starting treatment, your dose may need to be adjusted. It may take weeks or months before your dose is stable. You should contact your doctor or health care professional if your seizures get worse or if you have any new types of seizures. You may get drowsy or dizzy. Do not drive, use machinery, or do anything that needs mental alertness until you know how this medicine affects you. Do not stand or sit up quickly, especially if you are an older patient. This reduces the risk of dizzy or fainting spells. Alcohol may interfere with the effect of this medicine. Avoid alcoholic drinks. The use of this medicine may increase the chance of suicidal thoughts or actions. Pay special attention to how you are responding while on this medicine. Any worsening of mood, or   thoughts of suicide or dying should be reported to your health care professional right away. Women who become pregnant while using this medicine may enroll in the [place] Pregnancy Registry by calling [phone]. This registry collects information about the safety of  antiepileptic drug use during pregnancy. What side effects may I notice from receiving this medicine? Side effects that you should report to your doctor or health care professional as soon as possible: -allergic reactions like skin rash, itching or hives, swelling of the face, lips, or tongue -breathing problems -dark urine -general ill feeling or flu-like symptoms -problems with balance, talking, walking -unusually weak or tired -worsening of mood, thoughts or actions of suicide or dying -yellowing of the eyes or skin Side effects that usually do not require medical attention (report to your doctor or health care professional if they continue or are bothersome): -diarrhea -dizzy, drowsy -headache -loss of appetite This list may not describe all possible side effects. Call your doctor for medical advice about side effects. You may report side effects to FDA at 1-800-FDA-1088. Where should I keep my medicine? Keep out of reach of children. Store at room temperature between 15 and 30 degrees C (59 and 86 degrees F). Throw away any unused medicine after the expiration date. NOTE: This sheet is a summary. It may not cover all possible information. If you have questions about this medicine, talk to your doctor, pharmacist, or health care provider.  2018 Elsevier/Gold Standard (2015-11-06 09:43:54)

## 2017-05-09 NOTE — Progress Notes (Signed)
PATIENT: Kayla Kent DOB: 1932-10-15  REASON FOR VISIT: follow up- seizures HISTORY FROM: patient  HISTORY OF PRESENT ILLNESS: 05-09-2017, Kayla Kent is a meanwhile 83 year old sprite full female with a history of seizures that began only 4 years ago. She has continued to take Keppra 250 mA twice a day with good success. She lives independently he can perform all ADLs independently, she drives. She enjoys eating out. Participates in exercise classes and has a Physiological scientist 2 days a week. Her grandchildren attend [employer] and [employer].  Kayla Kent is an 83 year old female with a history of seizures. She returns today for follow-up. She continues on Keppra 250 mg twice a day. No seizure events. Able to complete all ADLs independently. Lives at home alone. No changes in mood or behavior. No change in gait or balance. She is able to prepare her own meals. Handles her own finances without difficulty. Participates in an exercise class. Reports that she is very social. Denies any new neurological symptoms. Returns today for an evaluation.  HISTORY 05/06/15 (MM): Kayla Kent is an 83 year old female with a history of seizures. She returns today for an evaluation. The patient has remained on Keppra 250 mg twice a day. She denies any seizure events. She operates a Teacher, music without difficulty. He is able to complete all ADLs independently. Since the last visit she did have a fall while doing yardwork and unfortunately fractured her right wrist and right hip. She underwent surgery and has recovered quite nicely. She continues to live alone although now she has help with yard work. Denies any new neurological symptoms. She returns today for an evaluation.  HISTORY 03/28/14 (CD): Kayla Kent is a 83 y.o., caucasian , widowed female here as a yearly routine follow up upon initial referral from Dr. Linda Hedges for seizure versus syncope.The patient was originally sent to [facility]  in 2013 for anevaluation of possible seizures. The first spell occurred in March 2013 , while in the [place].  The patient was sitting on a bench in the evening watching relatives play ball and suddenly had " this feeling of being pulled forward", her vision changed to gray, she could hear people around her -but could not respond. Her daughter witnessed the event and stated that her mother did not fall from the chair , but she threw her hands up for seconds, did not respond verbally.  The patient underwent a glucose check and electrolyte check at the ED, which were normal. Then she had a second event during a track meet ,watching her grandson run and the same spell happened to her. She recalls a woman, standing near to her, who was a nurse And checked her heart beat.  She came quickly back to normal levels of awareness . She never convulsed. She also overheard when another person in the bleachers announced she Will call 911 and she responded verbally : " no".  There was no Confusion, impairment or any other symptoms . There were no cardiac events- after Dr. Linda Hedges evaluated the Patient.  Again, differential diagnosis included syncope. Dr. Jacelyn Grip told her that he did not establish a diagnosis , but he started her on Keppra , ( for seizures ) she worked up to 2 tablets in the morning and 2 in p.m. She had no side effects reported at the time. A MRI of the brain documented right anterior temporal encephalomalacia. EEG was non conclusive.  REVIEW OF SYSTEMS: Out of  a complete 14 system review of symptoms, the patient complains only of the following symptoms, and all other reviewed systems are negative.  Frequency of urination, environmental allergies, food allergies, bruise easily, lazy eye.   ALLERGIES: Allergies  Allergen Reactions  . Chocolate Hives  . Codeine Other (See Comments)    Patient states she acts crazy  . Diazepam Other (See Comments)    Patient  states she acts crazy.  . Fruit & Vegetable Daily [Nutritional Supplements] Hives and Swelling    peaches  . Iohexol Hives     Code: HIVES, Desc: pt gets 13 hr pre-meds, Onset Date: [id number]   . Latex Hives  . Peach [Prunus Persica] Hives  . Peanut-Containing Drug Products Hives and Swelling  . Penicillins Hives, Itching and Swelling    Has patient had a PCN reaction causing immediate rash, facial/tongue/throat swelling, SOB or lightheadedness with hypotension: Yes Has patient had a PCN reaction causing severe rash involving mucus membranes or skin necrosis: No Has patient had a PCN reaction that required hospitalization No Has patient had a PCN reaction occurring within the last 10 years: No If all of the above answers are "NO", then may proceed with Cephalosporin use.   . Strawberry Extract Hives  . Sulfonamide Derivatives Hives    nausea  . Wheat Shortness Of Breath    Shortness of breath  . Wheat Bran Shortness Of Breath  . Ambien [Zolpidem Tartrate] Other (See Comments)    hallucinations  . Sulfamethoxazole Hives  . Crestor [Rosuvastatin Calcium] Rash  . Iodine Rash    HOME MEDICATIONS: Outpatient Medications Prior to Visit  Medication Sig Dispense Refill  . acetaminophen (TYLENOL) 500 MG tablet Take 1 tablet (500 mg total) by mouth every 6 (six) hours as needed for mild pain. 30 tablet 0  . ALPRAZolam (XANAX) 0.25 MG tablet TAKE ONE TABLET BY MOUTH TWICE DAILY AS NEEDED 60 tablet 3  . amLODipine (NORVASC) 2.5 MG tablet TAKE ONE TABLET BY MOUTH ONCE DAILY 90 tablet 3  . atorvastatin (LIPITOR) 10 MG tablet TAKE ONE TABLET BY MOUTH ONCE DAILY AT 6 PM 30 tablet 10  . diltiazem (DILACOR XR) 180 MG 24 hr capsule TAKE ONE CAPSULE BY MOUTH ONCE DAILY 90 capsule 0  . ELIQUIS 2.5 MG TABS tablet TAKE ONE TABLET BY MOUTH TWICE DAILY 180 tablet 3  . hydrocortisone (ANUSOL-HC) 25 MG suppository Place 1 suppository (25 mg total) rectally every 12 (twelve) hours. 12 suppository 1  .  levETIRAcetam (KEPPRA) 250 MG tablet TAKE ONE TABLET BY MOUTH TWICE DAILY 120 tablet 5  . pantoprazole (PROTONIX) 40 MG tablet Take 1 tablet (40 mg total) by mouth daily. 30 tablet 1  . polyethylene glycol (MIRALAX / GLYCOLAX) packet Take 17 g by mouth daily. 14 each 0  . senna-docusate (SENOKOT-S) 8.6-50 MG tablet Take 1 tablet by mouth 2 (two) times daily.    . sodium chloride (OCEAN) 0.65 % SOLN nasal spray Place 1 spray into both nostrils as needed for congestion.    . traMADol (ULTRAM) 50 MG tablet Take 1-2 tablets (50-100 mg total) by mouth every 6 (six) hours as needed for moderate pain or severe pain. 10 tablet 0  . traZODone (DESYREL) 50 MG tablet Take 0.5-1 tablets (25-50 mg total) by mouth at bedtime as needed for sleep. 90 tablet 1   No facility-administered medications prior to visit.     PAST MEDICAL HISTORY: Past Medical History:  Diagnosis Date  . Allergic rhinitis   . Anxiety   .  Emphysema of lung ([place])   . HLD (hyperlipidemia)   . HTN (hypertension)   . Intolerance of drug    orthostatic  . Paroxysmal supraventricular tachycardia ([place])   . PVD (peripheral vascular disease) ([place])   . Seizure ([place])   . Syncope and collapse   . Uterine prolapse without mention of vaginal wall prolapse     PAST SURGICAL HISTORY: Past Surgical History:  Procedure Laterality Date  . CARDIOVERSION N/A 11/05/2014   Procedure: CARDIOVERSION;  Surgeon: Candee Furbish, MD;  Location[facility] ENDOSCOPY;  Service: Cardiovascular;  Laterality: N/A;  . cataract surgery  08/2015  . CHOLECYSTECTOMY    . corrective eye surgery     as a child  . INTRAMEDULLARY (IM) NAIL INTERTROCHANTERIC Left 11/21/2016   Procedure: INTRAMEDULLARY (IM) NAIL INTERTROCHANTRIC HEMI;  Surgeon: Newt Minion, MD;  Location: [place] [facility];  Service: Orthopedics;  Laterality: Left;  . IVD removed    . OPEN REDUCTION INTERNAL FIXATION (ORIF) DISTAL RADIAL FRACTURE Right 08/29/2014   Procedure: OPEN REDUCTION INTERNAL FIXATION (ORIF)  DISTAL RADIAL FRACTURE;  Surgeon: Marianna Payment, MD;  Location: [place];  Service: Orthopedics;  Laterality: Right;  . RF ablation PSVT     summer '10  . stress cardiolite  08/05/93  . TONSILLECTOMY    . TOTAL HIP ARTHROPLASTY Right 08/29/2014   Procedure: Right Hip Hemi Arthroplasty;  Surgeon: Marianna Payment, MD;  Location: [facility];  Service: Orthopedics;  Laterality: Right;  Hip procedure 1st wants Peg Board, [employer], [place].     FAMILY HISTORY: Family History  Problem Relation Age of Onset  . Mental illness Father        suicide  . Arthritis Father   . Hyperlipidemia Sister   . Hypertension Sister   . Heart disease Brother        CAD/MI  . Hypertension Brother   . Hypertension Unknown        family hx  . Colon cancer Neg Hx   . Breast cancer Neg Hx   . Diabetes Neg Hx   . Stroke Neg Hx   . Cancer Neg Hx   . Lung disease Neg Hx     SOCIAL HISTORY: Social History   Social History  . Marital status: Widowed    Spouse name: N/A  . Number of children: 2  . Years of education: 59   Occupational History  . retired Radio producer    Social History Main Topics  . Smoking status: Former Smoker    Packs/day: 0.25    Years: 38.00    Start date: 02/15/1952    Quit date: 10/18/1993  . Smokeless tobacco: Never Used  . Alcohol use No  . Drug use: No  . Sexual activity: No   Other Topics Concern  . Not on file   Social History Narrative   HS[employer], [employer]-BA, [employer]-early childhood. Married '55 -72 years.  1 son - 39; 1 daughter - 58; 3 grandchildren . Lives alone. ACP - discussed and provided packet on end of life care (Feb '13)   Patient is now widowed.   Patient is right-handed.   Patient drinks tea daily.      [facility] (04/26/17):   Originally from [facility]. Previously was a Pharmacist, hospital. No pets currently. No mold exposure. No bird exposure.       PHYSICAL EXAM  Vitals:   05/09/17 1443  BP: (!) 144/63  Pulse: 70  Weight: 112 lb (50.8  kg)  Height:  5\' 7"  (1.702 m)   Body mass index is 17.54 kg/m.  Generalized: Well developed, in no acute distress   Neurological examination  Mentation: Alert oriented to time, place, history taking. 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 Uvula tongue midline. Head turning and shoulder shrug  were normal and symmetric. Motor: 5 / 5 strength of all 4 extremities. Good symmetric motor tone is noted throughout.  Sensory: Sensory testing is intact to soft touch on all 4 extremities. No evidence of extinction is noted.  Coordination: Cerebellar testing reveals good finger-nose-finger and heel-to-shin bilaterally.  Gait and station: Gait is normal. Reflexes: Deep tendon reflexes are symmetric bilaterally.   DIAGNOSTIC DATA (LABS, IMAGING, TESTING) - I reviewed patient records, labs, notes, testing and imaging myself where available.  Lab Results  Component Value Date   WBC 8.6 04/15/2017   HGB 12.8 04/15/2017   HCT 39.5 04/15/2017   MCV 89.6 04/15/2017   PLT 307 04/15/2017      Component Value Date/Time   NA 139 04/15/2017 0344   NA 144 03/18/2017 1200   K 3.9 04/15/2017 0344   CL 107 04/15/2017 0344   CO2 24 04/15/2017 0344   GLUCOSE 90 04/15/2017 0344   GLUCOSE 107 (H) 10/25/2006 0906   BUN 15 04/15/2017 0344   BUN 23 03/18/2017 1200   CREATININE 1.07 (H) 04/15/2017 0344   CREATININE 1.16 (H) 08/06/2016 1440   CALCIUM 8.5 (L) 04/15/2017 0344   PROT 6.8 04/13/2017 0839   ALBUMIN 3.6 04/13/2017 0839   AST 16 04/13/2017 0839   ALT 26 04/13/2017 0839   ALKPHOS 118 (H) 04/13/2017 0839   BILITOT 0.7 04/13/2017 0839   GFRNONAA 47 (L) 04/15/2017 0344   GFRAA 54 (L) 04/15/2017 0344   Lab Results  Component Value Date   CHOL 132 10/30/2015   HDL 33 (L) 10/30/2015   LDLCALC 85 10/30/2015   LDLDIRECT 175.3 01/19/2008   TRIG 71 10/30/2015    CHOLHDL 4.0 10/30/2015         ASSESSMENT AND PLAN 83 y.o. year old female  has a past medical history of Allergic rhinitis; Anxiety; Emphysema of lung ([place]); HLD (hyperlipidemia); HTN (hypertension); Intolerance of drug; Paroxysmal supraventricular tachycardia ([place]); PVD (peripheral vascular disease) ([place]); Seizure ([place]); Syncope and collapse; and Uterine prolapse without mention of vaginal wall prolapse. here with:  1. Seizures with sudden collapse- have not returned since she takes Kepra , low dose.   Overall the patient is doing well. Will continue on Keppra 250 mg twice a day.  Advised that if she has any  seizure event she she'll let us know.  Follow-up with me in one year or sooner if needed.     Larey Seat, MD  05/09/2017, 3:12 PM [facility] [address] [place], [place] [address] [phone]    Cc Dorris Carnes, MD

## 2017-05-20 ENCOUNTER — Other Ambulatory Visit: Payer: Self-pay

## 2017-05-20 NOTE — Patient Outreach (Signed)
[facility]) Care Management  05/20/17  Kayla Kent 12-16-1931 [id number]  Attempted to reach patient without success. Phone rang with no answer and no option to leave a voicemail due to message stating memory was full.  [place] R. Thetis Schwimmer, RN, BSN, [place] Management Coordinator [phone]

## 2017-05-23 ENCOUNTER — Ambulatory Visit: Payer: Self-pay

## 2017-05-23 ENCOUNTER — Other Ambulatory Visit: Payer: Self-pay

## 2017-05-23 NOTE — Patient Outreach (Signed)
[facility]) Care Management  05/23/17  IVEE POELLNITZ [birth year]  [id number]  Successful outreach completed with patient. Patient identification verified.  Patient stated that she had dentist appointment today, but she canceled it because she was feeling nauseated. She stated that she has just been thinking about her stress test that she is having on Wednesday this week. RNCM inquired if she was having another stress test as she has recently had one and she stated she thinks she is supposed to walk on the treadmill this time but she is not sure. RNCM reviewed appointments but did not see a stress test. She stated tat she has the paper and proceeded to read it to [facility]. She is scheduled for a PET CT scan on Wednesday. She stated that Dr. Ashok Cordia ordered it and began to talk about how they had found a spot on her lung. RNCM provided education to patient about PET CT scan and that can help to determine if the spot on her lung is cancerous. RNCM educated that this is different from the stress test and she verbalized understanding. She then proceeded to discuss additional appointments with her lung doctor in August. She stated that she has an appointment on 06/14/2017 for a 6 minute walk test and that she was told it would take about 30 minutes. She then stated that she has an appointment with Dr. Ashok Cordia on 06/15/2017 for her follow up. RNCM asked about her appointment on the same day for PFT and she stated she did not have that appointment written on her instructions. Patient has an appointment at 12 pm on 06/15/2017 for pulmonary function testing, prior to her 1:45 pm appointment to see Dr. Ashok Cordia. She was not aware and will write it down when we get off the phone.  Patient stated she is no longer feeling nauseated and also denies any problems with palpitations, chest pain or shortness of breath. She denies any needs or concerns at present.  [place] R. Flem Enderle, RN, BSN, [place]  Management Coordinator [phone]

## 2017-05-25 ENCOUNTER — Encounter (HOSPITAL_COMMUNITY)
Admission: RE | Admit: 2017-05-25 | Discharge: 2017-05-25 | Disposition: A | Discharge status: Home / Self Care | Payer: Medicare Other | Source: Ambulatory Visit | Attending: Pulmonary Disease | Admitting: Pulmonary Disease

## 2017-05-25 DIAGNOSIS — R911 Solitary pulmonary nodule: Secondary | ICD-10-CM | POA: Diagnosis not present

## 2017-05-25 DIAGNOSIS — IMO0001 Reserved for inherently not codable concepts without codable children: Secondary | ICD-10-CM

## 2017-05-25 LAB — GLUCOSE, CAPILLARY: Glucose-Capillary: 88 mg/dL (ref 65–99)

## 2017-05-26 ENCOUNTER — Other Ambulatory Visit: Payer: Self-pay | Admitting: Internal Medicine

## 2017-05-26 DIAGNOSIS — E042 Nontoxic multinodular goiter: Secondary | ICD-10-CM | POA: Diagnosis not present

## 2017-05-26 DIAGNOSIS — C3411 Malignant neoplasm of upper lobe, right bronchus or lung: Secondary | ICD-10-CM | POA: Diagnosis not present

## 2017-05-26 MED ORDER — FLUDEOXYGLUCOSE F - 18 (FDG) INJECTION
6.8000 | Freq: Once | INTRAVENOUS | Status: AC | PRN
  Administered 2017-05-26: 6.8 via INTRAVENOUS

## 2017-05-27 ENCOUNTER — Telehealth: Payer: Self-pay | Admitting: Pulmonary Disease

## 2017-05-27 NOTE — Telephone Encounter (Signed)
JN pt is calling about the results of the PET scan that was done yesterday.  Please advise. Thanks    I attempted to call the pt but there was not an answer and no VM.

## 2017-05-30 ENCOUNTER — Ambulatory Visit: Payer: Self-pay

## 2017-05-30 NOTE — Telephone Encounter (Signed)
Dr Ashok Cordia please advise on pt's PET scan results from 8.8.18 Thank you

## 2017-05-31 ENCOUNTER — Encounter: Payer: Self-pay | Admitting: Internal Medicine

## 2017-05-31 ENCOUNTER — Ambulatory Visit (INDEPENDENT_AMBULATORY_CARE_PROVIDER_SITE_OTHER): Payer: Medicare Other | Admitting: Internal Medicine

## 2017-05-31 VITALS — BP 118/64 | HR 76 | Temp 98.4°F | Ht 67.0 in | Wt 113.0 lb

## 2017-05-31 DIAGNOSIS — IMO0001 Reserved for inherently not codable concepts without codable children: Secondary | ICD-10-CM

## 2017-05-31 DIAGNOSIS — K219 Gastro-esophageal reflux disease without esophagitis: Secondary | ICD-10-CM | POA: Diagnosis not present

## 2017-05-31 DIAGNOSIS — R911 Solitary pulmonary nodule: Secondary | ICD-10-CM | POA: Diagnosis not present

## 2017-05-31 DIAGNOSIS — I10 Essential (primary) hypertension: Secondary | ICD-10-CM

## 2017-05-31 MED ORDER — ZOLPIDEM TARTRATE 5 MG PO TABS: 5.0000 mg | ORAL_TABLET | Freq: Every evening | ORAL | 5 refills | Status: DC | PRN

## 2017-05-31 NOTE — Telephone Encounter (Signed)
See the result note that I sent to Carleigh's inbox. Thanks.

## 2017-05-31 NOTE — Telephone Encounter (Signed)
Result Notes   Notes recorded by Javier Glazier, MD on 05/30/2017 at 2:23 PM EDT Please let the patient noted that the spot in her right upper lobe appears to be positive on her PET/CT scan and could be cancer. We will review this at her follow-up appointment and come up with a plan on how to proceed based on her walking test and breathing test. Thank you.

## 2017-05-31 NOTE — Patient Instructions (Signed)
At your request, you are given the copy of the written Pet scan report  I will send message to Dr Ashok Cordia, to ask if you can be seen back sooner than Aug 29  Please continue all other medications as before, and refills have been done if requested - the ambien  Please have the pharmacy call with any other refills you may need.  Please keep your appointments with your specialists as you may have planned

## 2017-05-31 NOTE — Progress Notes (Signed)
Subjective:    Patient ID: Kayla Kent, female    DOB: 09-14-1932, 83 y.o.   MRN: [id number]  HPI  Here after initially made appt due to persistent n/v related to eating at local [facility] grilled salmon 2 days ago; had a rough night that night and did not sleep, but now fotunately symptoms has resolved.  No fever and Denies worsening reflux, abd pain, dysphagia, n/v, bowel change or blood. Pt denies chest pain, increased sob or doe, wheezing, orthopnea, PND, increased LE swelling, palpitations, dizziness or syncope.  Pt denies new neurological symptoms such as new headache, or facial or extremity weakness or numbness   Pt denies polydipsia, polyuria.  Now states she did not have hallucination with Lorrin Mais, asks for refill as trazodone not working well.  Also is extremely interested in results of PET scan which has not been able to be relayed to her as of yet per pulmonary.  Denies urinary symptoms such as dysuria, frequency, urgency, flank pain, hematuria or n/v, fever, chills. Past Medical History:  Diagnosis Date  . Allergic rhinitis   . Anxiety   . Emphysema of lung ([place])   . HLD (hyperlipidemia)   . HTN (hypertension)   . Intolerance of drug    orthostatic  . Paroxysmal supraventricular tachycardia ([place])   . PVD (peripheral vascular disease) ([place])   . Seizure ([place])   . Syncope and collapse   . Uterine prolapse without mention of vaginal wall prolapse    Past Surgical History:  Procedure Laterality Date  . CARDIOVERSION N/A 11/05/2014   Procedure: CARDIOVERSION;  Surgeon: Candee Furbish, MD;  Location: [facility] ENDOSCOPY;  Service: Cardiovascular;  Laterality: N/A;  . cataract surgery  08/2015  . CHOLECYSTECTOMY    . corrective eye surgery     as a child  . INTRAMEDULLARY (IM) NAIL INTERTROCHANTERIC Left 11/21/2016   Procedure: INTRAMEDULLARY (IM) NAIL INTERTROCHANTRIC HEMI;  Surgeon: Newt Minion, MD;  Location: [place];  Service: Orthopedics;  Laterality: Left;  . IVD removed    . OPEN  REDUCTION INTERNAL FIXATION (ORIF) DISTAL RADIAL FRACTURE Right 08/29/2014   Procedure: OPEN REDUCTION INTERNAL FIXATION (ORIF) DISTAL RADIAL FRACTURE;  Surgeon: Marianna Payment, MD;  Location: [place];  Service: Orthopedics;  Laterality: Right;  . RF ablation PSVT     summer '10  . stress cardiolite  08/05/93  . TONSILLECTOMY    . TOTAL HIP ARTHROPLASTY Right 08/29/2014   Procedure: Right Hip Hemi Arthroplasty;  Surgeon: Marianna Payment, MD;  Location: [place];  Service: Orthopedics;  Laterality: Right;  Hip procedure 1st wants Peg Board, [employer], [place].     reports that she quit smoking about 23 years ago. She started smoking about 65 years ago. She has a 9.50 pack-year smoking history. She has never used smokeless tobacco. She reports that she does not drink alcohol or use drugs. family history includes Arthritis in her father; Heart disease in her brother; Hyperlipidemia in her sister; Hypertension in her brother, sister, and unknown relative; Mental illness in her father. Allergies  Allergen Reactions  . Chocolate Hives  . Codeine Other (See Comments)    Patient states she acts crazy  . Diazepam Other (See Comments)    Patient states she acts crazy.  . Fruit & Vegetable Daily [Nutritional Supplements] Hives and Swelling    peaches  . Iohexol Hives     Code: HIVES, Desc: pt gets 13 hr pre-meds, Onset Date: [id number]   . Latex  Hives  . Peach [Prunus Persica] Hives  . Peanut-Containing Drug Products Hives and Swelling  . Penicillins Hives, Itching and Swelling    Has patient had a PCN reaction causing immediate rash, facial/tongue/throat swelling, SOB or lightheadedness with hypotension: Yes Has patient had a PCN reaction causing severe rash involving mucus membranes or skin necrosis: No Has patient had a PCN reaction that required hospitalization No Has patient had a PCN reaction occurring within the last 10 years: No If all of the above answers are "NO", then may  proceed with Cephalosporin use.   . Strawberry Extract Hives  . Sulfonamide Derivatives Hives    nausea  . Wheat Shortness Of Breath    Shortness of breath  . Wheat Bran Shortness Of Breath  . Sulfamethoxazole Hives  . Crestor [Rosuvastatin Calcium] Rash  . Iodine Rash   Current Outpatient Prescriptions on File Prior to Visit  Medication Sig Dispense Refill  . acetaminophen (TYLENOL) 500 MG tablet Take 1 tablet (500 mg total) by mouth every 6 (six) hours as needed for mild pain. 30 tablet 0  . ALPRAZolam (XANAX) 0.25 MG tablet TAKE ONE TABLET BY MOUTH TWICE DAILY AS NEEDED 60 tablet 3  . amLODipine (NORVASC) 2.5 MG tablet TAKE ONE TABLET BY MOUTH ONCE DAILY 90 tablet 3  . atorvastatin (LIPITOR) 10 MG tablet TAKE ONE TABLET BY MOUTH ONCE DAILY AT 6 PM 30 tablet 10  . DILT-XR 180 MG 24 hr capsule TAKE 1 CAPSULE BY MOUTH ONCE DAILY 90 capsule 0  . ELIQUIS 2.5 MG TABS tablet TAKE ONE TABLET BY MOUTH TWICE DAILY 180 tablet 3  . hydrocortisone (ANUSOL-HC) 25 MG suppository Place 1 suppository (25 mg total) rectally every 12 (twelve) hours. 12 suppository 1  . levETIRAcetam (KEPPRA) 250 MG tablet Take 1 tablet (250 mg total) by mouth 2 (two) times daily. 120 tablet 5  . pantoprazole (PROTONIX) 40 MG tablet Take 1 tablet (40 mg total) by mouth daily. 30 tablet 1  . polyethylene glycol (MIRALAX / GLYCOLAX) packet Take 17 g by mouth daily. 14 each 0  . senna-docusate (SENOKOT-S) 8.6-50 MG tablet Take 1 tablet by mouth 2 (two) times daily.    . sodium chloride (OCEAN) 0.65 % SOLN nasal spray Place 1 spray into both nostrils as needed for congestion.    . traMADol (ULTRAM) 50 MG tablet Take 1-2 tablets (50-100 mg total) by mouth every 6 (six) hours as needed for moderate pain or severe pain. 10 tablet 0   No current facility-administered medications on file prior to visit.     Review of Systems  Constitutional: Negative for other unusual diaphoresis or sweats HENT: Negative for ear discharge or  swelling Eyes: Negative for other worsening visual disturbances Respiratory: Negative for stridor or other swelling  Gastrointestinal: Negative for worsening distension or other blood Genitourinary: Negative for retention or other urinary change Musculoskeletal: Negative for other MSK pain or swelling Skin: Negative for color change or other new lesions Neurological: Negative for worsening tremors and other numbness  Psychiatric/Behavioral: Negative for worsening agitation or other fatigue All other system neg per pt    Objective:   Physical Exam BP 118/64   Pulse 76   Temp 98.4 F (36.9 C)   Ht 5\' 7"  (1.702 m)   Wt 113 lb (51.3 kg)   SpO2 98%   BMI 17.70 kg/m  VS noted,  Constitutional: Pt appears in NAD HENT: Head: NCAT.  Right Ear: External ear normal.  Left Ear: External ear normal.  Eyes: . Pupils are equal, round, and reactive to light. Conjunctivae and EOM are normal Nose: without d/c or deformity Neck: Neck supple. Gross normal ROM Cardiovascular: Normal rate and IRIR rhythm.   Pulmonary/Chest: Effort normal and breath sounds without rales or wheezing.  Abd:  Soft, NT, ND, + BS, no organomegaly, no flank tender Neurological: Pt is alert. At baseline orientation, motor grossly intact Skin: Skin is warm. No rashes, other new lesions, no LE edema Psychiatric: Pt behavior is normal without agitation , mild nervous No other exam findings Lab Results  Component Value Date   WBC 8.6 04/15/2017   HGB 12.8 04/15/2017   HCT 39.5 04/15/2017   PLT 307 04/15/2017   GLUCOSE 90 04/15/2017   CHOL 132 10/30/2015   TRIG 71 10/30/2015   HDL 33 (L) 10/30/2015   LDLDIRECT 175.3 01/19/2008   LDLCALC 85 10/30/2015   ALT 26 04/13/2017   AST 16 04/13/2017   NA 139 04/15/2017   K 3.9 04/15/2017   CL 107 04/15/2017   CREATININE 1.07 (H) 04/15/2017   BUN 15 04/15/2017   CO2 24 04/15/2017   TSH 1.110 04/06/2017   INR 1.59 04/06/2017   HGBA1C 6.0 04/13/2017   [id number] PET scan  results given to pt    Assessment & Plan:

## 2017-06-01 NOTE — Assessment & Plan Note (Signed)
stable overall by history and exam, recent data reviewed with pt, and pt to continue medical treatment as before,  to f/u any worsening symptoms or concerns BP Readings from Last 3 Encounters:  05/31/17 118/64  05/09/17 (!) 144/63  04/26/17 118/64

## 2017-06-01 NOTE — Assessment & Plan Note (Signed)
With recent flare n/v likely related to food, ok to follow as symptoms resolved

## 2017-06-01 NOTE — Telephone Encounter (Signed)
ATC patient, went straight to VM. Left a message to call back for results.

## 2017-06-01 NOTE — Assessment & Plan Note (Signed)
I did mention to pt based on the radiological interpretation that the scan is abnormal, and suggestive of possible malignancy.  I was clear to let her know that I should and could not adequately provide further information or advice on next steps in evaluation and treatment,  She plans to f/u with Pulm, has appt later this month.

## 2017-06-03 ENCOUNTER — Other Ambulatory Visit: Payer: Self-pay

## 2017-06-03 NOTE — Telephone Encounter (Signed)
lmomtcb x 2  

## 2017-06-03 NOTE — Patient Outreach (Signed)
[facility]) Care Management  06/03/17  Kayla Kent 05/18/1932 [id number]  Attempted to reach patient without success. Phone rang multiple times with no answer and no option to leave a voicemail.  RNCM to attempt follow up outreach again within next week.  [place] R. Lakeya Mulka, RN, BSN, [place] Management Coordinator [phone]

## 2017-06-06 NOTE — Telephone Encounter (Signed)
lmtcb x3 for pt. 

## 2017-06-06 NOTE — Telephone Encounter (Signed)
Pt is already been made aware of these results. I spoke with her last week.

## 2017-06-06 NOTE — Telephone Encounter (Signed)
Patient returned call, CB is [phone].

## 2017-06-09 ENCOUNTER — Other Ambulatory Visit: Payer: Self-pay

## 2017-06-09 ENCOUNTER — Other Ambulatory Visit: Payer: Self-pay | Admitting: Internal Medicine

## 2017-06-09 NOTE — Patient Outreach (Signed)
[facility]) Care Management  06/09/17  Kayla Kent 1932/07/02 [id number]  Successful outreach completed with patient. Patient identification verified.   Patient stated that she has been doing well and has no complaints of needs. She currently does not have any case management needs and RNCM and patient discussed case closure.   Patient expressed concerns over next week's appointments with Dr. Ashok Cordia. She stated that she went for one of her tests and she will find out in her upcoming appointment what her diagnosis is and she expressed she is dreading learning about her diagnosis. RNCM offered continued support and will follow up with patient following the appointments to assess if patient has any additional needs at that time and can assist with understanding her diagnosis if needed through education. Patient was agreeable with plan.  Patient has the following appointments next week:   06/14/2017 at 11:00 am at [facility] Pulmonary for 6 minute walk test  06/15/2017 at 12:00 pm for  PFT  06/15/2017 at 1:45 pm with Dr. Ashok Cordia for follow up and per patient, to review results Plan: RNCM to follow up with patient next week regarding her appointment with Dr. Ashok Cordia and reassess for needs. Patient is agreeable with plan. [name], RN, BSN, [place] Management Coordinator [phone]

## 2017-06-14 ENCOUNTER — Ambulatory Visit (INDEPENDENT_AMBULATORY_CARE_PROVIDER_SITE_OTHER): Payer: Medicare Other | Admitting: *Deleted

## 2017-06-14 DIAGNOSIS — J432 Centrilobular emphysema: Secondary | ICD-10-CM | POA: Diagnosis not present

## 2017-06-14 NOTE — Progress Notes (Signed)
SIX MIN WALK 06/14/2017  Medications eliquis 2.5mg , diltiazem 180mg , and Keppra 250mg - taken at approx 8:00  Supplimental Oxygen during Test? (L/min) No  Laps 5  Partial Lap (in Meters) 16  Baseline BP (sitting) 120/58  Baseline Heartrate 72  Baseline Dyspnea (Borg Scale) 0  Baseline Fatigue (Borg Scale) 0  Baseline SPO2 100  BP (sitting) 128/62  Heartrate 110  Dyspnea (Borg Scale) 0  Fatigue (Borg Scale) 0  SPO2 98  BP (sitting) 116/56  Heartrate 73  SPO2 99  Stopped or Paused before Six Minutes Yes  Other Symptoms at end of Exercise Pt requested to stop at 3:31 into test (had walked 3 complete laps) for a restroom break.  Walk was stopped X1:34.    Distance Completed 256  Tech Comments: pt tolerated walk well.

## 2017-06-15 ENCOUNTER — Ambulatory Visit (INDEPENDENT_AMBULATORY_CARE_PROVIDER_SITE_OTHER): Payer: Medicare Other | Admitting: Pulmonary Disease

## 2017-06-15 ENCOUNTER — Encounter: Payer: Self-pay | Admitting: Pulmonary Disease

## 2017-06-15 ENCOUNTER — Other Ambulatory Visit: Payer: Medicare Other

## 2017-06-15 VITALS — BP 118/76 | HR 82 | Ht 67.0 in | Wt 110.0 lb

## 2017-06-15 DIAGNOSIS — R911 Solitary pulmonary nodule: Secondary | ICD-10-CM | POA: Diagnosis not present

## 2017-06-15 DIAGNOSIS — J432 Centrilobular emphysema: Secondary | ICD-10-CM

## 2017-06-15 DIAGNOSIS — IMO0001 Reserved for inherently not codable concepts without codable children: Secondary | ICD-10-CM

## 2017-06-15 LAB — PULMONARY FUNCTION TEST
FEF 25-75 Pre: 1.77 L/sec
FEF2575-%PRED-PRE: 125 %
FEV1-%PRED-PRE: 82 %
FEV1-Pre: 1.74 L
FEV1FVC-%Pred-Pre: 107 %
FEV6-%Pred-Pre: 82 %
FEV6-PRE: 2.21 L
FEV6FVC-%Pred-Pre: 106 %
FVC-%PRED-PRE: 77 %
FVC-Pre: 2.21 L
PRE FEV6/FVC RATIO: 100 %
Pre FEV1/FVC ratio: 79 %

## 2017-06-15 NOTE — Progress Notes (Signed)
Subjective:    Patient ID: Kayla Kent, female    DOB: 02-06-1932, 83 y.o.   MRN: [id number]  C.C.:  Follow-up for Right Upper Lobe Nodule & Pulmonary Centrilobular Emphysema.   HPI Right Upper Lobe Nodule: Seen initially on CT imaging in June 2018. Hypermetabolic on PET CT scan. No other areas of hypermetabolism other than thyroid nodule noted.  Pulmonary Centrilobular Emphysema: No significant desaturation or hypoxia on her 6 minute walk test. Patient was unable to perform pulmonary function testing. Patient reports no significant dyspnea. No coughing or wheezing.  Review of Systems No chest pain or pressure. No fever or chills. No abdominal pain or nausea.   Allergies  Allergen Reactions  . Chocolate Hives  . Codeine Other (See Comments)    Patient states she acts crazy  . Diazepam Other (See Comments)    Patient states she acts crazy.  . Fruit & Vegetable Daily [Nutritional Supplements] Hives and Swelling    peaches  . Iohexol Hives     Code: HIVES, Desc: pt gets 13 hr pre-meds, Onset Date: [id number]   . Latex Hives  . Peach [Prunus Persica] Hives  . Peanut-Containing Drug Products Hives and Swelling  . Penicillins Hives, Itching and Swelling    Has patient had a PCN reaction causing immediate rash, facial/tongue/throat swelling, SOB or lightheadedness with hypotension: Yes Has patient had a PCN reaction causing severe rash involving mucus membranes or skin necrosis: No Has patient had a PCN reaction that required hospitalization No Has patient had a PCN reaction occurring within the last 10 years: No If all of the above answers are "NO", then may proceed with Cephalosporin use.   . Strawberry Extract Hives  . Sulfonamide Derivatives Hives    nausea  . Wheat Shortness Of Breath    Shortness of breath  . Wheat Bran Shortness Of Breath  . Sulfamethoxazole Hives  . Crestor [Rosuvastatin Calcium] Rash  . Iodine Rash    Current Outpatient Prescriptions on File Prior  to Visit  Medication Sig Dispense Refill  . acetaminophen (TYLENOL) 500 MG tablet Take 1 tablet (500 mg total) by mouth every 6 (six) hours as needed for mild pain. 30 tablet 0  . amLODipine (NORVASC) 2.5 MG tablet TAKE ONE TABLET BY MOUTH ONCE DAILY 90 tablet 3  . atorvastatin (LIPITOR) 10 MG tablet TAKE ONE TABLET BY MOUTH ONCE DAILY AT 6 PM 30 tablet 10  . DILT-XR 180 MG 24 hr capsule TAKE 1 CAPSULE BY MOUTH ONCE DAILY 90 capsule 0  . DILT-XR 180 MG 24 hr capsule TAKE 1 CAPSULE BY MOUTH ONCE DAILY 90 capsule 2  . ELIQUIS 2.5 MG TABS tablet TAKE ONE TABLET BY MOUTH TWICE DAILY 180 tablet 3  . levETIRAcetam (KEPPRA) 250 MG tablet Take 1 tablet (250 mg total) by mouth 2 (two) times daily. 120 tablet 5  . pantoprazole (PROTONIX) 40 MG tablet Take 1 tablet (40 mg total) by mouth daily. 30 tablet 1  . sodium chloride (OCEAN) 0.65 % SOLN nasal spray Place 1 spray into both nostrils as needed for congestion.    . traMADol (ULTRAM) 50 MG tablet Take 1-2 tablets (50-100 mg total) by mouth every 6 (six) hours as needed for moderate pain or severe pain. 10 tablet 0  . ALPRAZolam (XANAX) 0.25 MG tablet TAKE ONE TABLET BY MOUTH TWICE DAILY AS NEEDED (Patient not taking: Reported on 06/15/2017) 60 tablet 3  . hydrocortisone (ANUSOL-HC) 25 MG suppository Place 1 suppository (25 mg total) rectally  every 12 (twelve) hours. (Patient not taking: Reported on 06/15/2017) 12 suppository 1  . polyethylene glycol (MIRALAX / GLYCOLAX) packet Take 17 g by mouth daily. (Patient not taking: Reported on 06/15/2017) 14 each 0  . senna-docusate (SENOKOT-S) 8.6-50 MG tablet Take 1 tablet by mouth 2 (two) times daily. (Patient not taking: Reported on 06/15/2017)    . zolpidem (AMBIEN) 5 MG tablet Take 1 tablet (5 mg total) by mouth at bedtime as needed for sleep. (Patient not taking: Reported on 06/15/2017) 30 tablet 5   No current facility-administered medications on file prior to visit.     Past Medical History:  Diagnosis Date   . Allergic rhinitis   . Anxiety   . Emphysema of lung ([place])   . HLD (hyperlipidemia)   . HTN (hypertension)   . Intolerance of drug    orthostatic  . Paroxysmal supraventricular tachycardia ([place])   . PVD (peripheral vascular disease) ([place])   . Seizure ([place])   . Syncope and collapse   . Uterine prolapse without mention of vaginal wall prolapse     Past Surgical History:  Procedure Laterality Date  . CARDIOVERSION N/A 11/05/2014   Procedure: CARDIOVERSION;  Surgeon: Candee Furbish, MD;  Location: [facility] ENDOSCOPY;  Service: Cardiovascular;  Laterality: N/A;  . cataract surgery  08/2015  . CHOLECYSTECTOMY    . corrective eye surgery     as a child  . INTRAMEDULLARY (IM) NAIL INTERTROCHANTERIC Left 11/21/2016   Procedure: INTRAMEDULLARY (IM) NAIL INTERTROCHANTRIC HEMI;  Surgeon: Newt Minion, MD;  Location: [place];  Service: Orthopedics;  Laterality: Left;  . IVD removed    . OPEN REDUCTION INTERNAL FIXATION (ORIF) DISTAL RADIAL FRACTURE Right 08/29/2014   Procedure: OPEN REDUCTION INTERNAL FIXATION (ORIF) DISTAL RADIAL FRACTURE;  Surgeon: Marianna Payment, MD;  Location: [place];  Service: Orthopedics;  Laterality: Right;  . RF ablation PSVT     summer '10  . stress cardiolite  08/05/93  . TONSILLECTOMY    . TOTAL HIP ARTHROPLASTY Right 08/29/2014   Procedure: Right Hip Hemi Arthroplasty;  Surgeon: Marianna Payment, MD;  Location: [place];  Service: Orthopedics;  Laterality: Right;  Hip procedure 1st wants Peg Board, [employer], [place].     Family History  Problem Relation Age of Onset  . Mental illness Father        suicide  . Arthritis Father   . Hyperlipidemia Sister   . Hypertension Sister   . Heart disease Brother        CAD/MI  . Hypertension Brother   . Hypertension Unknown        family hx  . Colon cancer Neg Hx   . Breast cancer Neg Hx   . Diabetes Neg Hx   . Stroke Neg Hx   . Cancer Neg Hx   . Lung disease Neg Hx     Social History   Social History  .  Marital status: Widowed    Spouse name: N/A  . Number of children: 2  . Years of education: 58   Occupational History  . retired Radio producer    Social History Main Topics  . Smoking status: Former Smoker    Packs/day: 0.25    Years: 38.00    Start date: 02/15/1952    Quit date: 10/18/1993  . Smokeless tobacco: Never Used  . Alcohol use No  . Drug use: No  . Sexual activity: No   Other Topics Concern  . None   Social  History Narrative   [employer], [employer]-BA, [employer]-early childhood. Married '55 -24 years.  1 son - 46; 1 daughter - 24; 3 grandchildren . Lives alone. ACP - discussed and provided packet on end of life care (Feb '13)   Patient is now widowed.   Patient is right-handed.   Patient drinks tea daily.      [facility] (04/26/17):   Originally from [facility]. Previously was a Pharmacist, hospital. No pets currently. No mold exposure. No bird exposure.       Objective:   Physical Exam BP 118/76 (BP Location: Left Arm, Cuff Size: Normal)   Pulse 82   Ht 5\' 7"  (1.702 m)   Wt 110 lb (49.9 kg)   SpO2 96%   BMI 17.23 kg/m   General:  Awake. Accompanied by daughter today. Alert. Integument:  Warm & dry. No rash on exposed skin.  Lymphatics: No appreciated cervical adenopathy. HEENT:  Moist mucus membranes. No nasal turbinate swelling. No oral ulcers. Cardiovascular:  Regular rate. No edema. Unable to appreciate JVD.  Pulmonary:  Clear to auscultation bilaterally. Normal work of breathing on room air. Abdomen: Soft. Normal bowel sounds. Nondistended.  Musculoskeletal:  Normal bulk and tone. Hand grip strength 5/5 bilaterally. No joint deformity or effusion appreciated.  Neurological: Symmetric brachioradialis reflexes.  PFT 06/15/17:  Patient unable to perform testing  6MWT 06/14/17:  Walked 256 meters / Baseline Sat 100% on RA / Nadir Sat 98% on RA  IMAGING PET CT 05/26/17 (personally reviewed by me with patient today):  1.2 cm hypermetabolic right upper lobe nodule.  1.7 cm hypermetabolic left thyroid nodule. 2 mm left upper lobe nodule also noted. Right temporal lobe encephalomalacia. No pathologic mediastinal adenopathy or other areas of suggestive metastatic spread. No pleural effusion or thickening. No pericardial effusion. Small hiatal hernia present.  CXR PA/LAT 04/14/17 (Previously reviewed by me):  Small bilateral pleural effusions suspected. Only evident on lateral imaging. Hyperinflation suggested by flattening of the diaphragms. Heart normal in size & mediastinum normal in contour. No parenchymal mass or nodule appreciated.  CT CHEST W/O 04/07/17 (previously reviewed by me): Centrally located right upper lobe 1.6 cm spiculated nodule. Subcentimeter groundglass nodule present within lateral segment right middle lobe as well. No other definite parenchymal opacity, mass, or nodule appreciated. Bilateral pleural effusions right greater than left with questionable mediastinal adenopathy which cannot be fully appreciated without contrast is disintegrating nature of the imaging. Mild apical centrilobular emphysema.  CARDIAC TTE (04/08/17):  LV normal in size with EF 55-60%. Elevated LV end-diastolic filling pressure. LA normal in size & RA mildly dilated. RV normal in size and function. No aortic stenosis with trivial regurgitation. Aortic root normal in size. Mild mitral regurgitation. Mild pulmonic and tricuspid regurgitation. Trivial pericardial effusion.    Assessment & Plan:  83 y.o. female with centrilobular pulmonary emphysema as well as a hypermetabolic right upper lobe nodule concerning for malignant process. Patient's walk test today is reassuring that she does not need oxygen therapy or have a significant desaturation. Lengthy discussion with the patient and her daughter today while reviewing her PET CT and previous chest CT imaging. Explained that I cannot rule out the possibility that this hypermetabolic right upper lobe nodule represents an early  malignancy given her risk factors and its appearance on PET/CT this is quite probable. We discussed continuing to monitor without intervention versus referral to thoracic surgery for video-assisted thoracoscopic surgery and resection versus stereotactic body radiation therapy. The patient is not interested in surgery  at this time and given her comorbidities I feel this is quite reasonable. She is willing to consider radiation therapy. I discussed obtaining a biopsy with CT guidance but explained that this does have risk factors given the central location of the nodule. We also briefly touched on navigational biopsy of the nodule, but again this has risk factors as well with general anesthesia and endotracheal intubation. I gave the patient and her daughter my contact information again and answered all their questions. I instructed them to contact me if they have any further questions after discussing this with her son who is a paramedic in [place].  1. Hypermetabolic right upper lobe nodule:  Referring patient to radiation oncology for consideration of SBRT. 2. Pulmonary centrilobular emphysema: Screening for alpha-1 antitrypsin deficiency today. Holding off on inhaler treatment. 3. Health maintenance: Status post Prevnar vaccine April 2015 & Pneumovax September 2010. 4. Follow-up: Return to clinic in 3 months or sooner if needed.  Sonia Baller Ashok Cordia, M.D. [facility] Pulmonary & Critical Care Pager:  [phone] After 3pm or if no response, call [phone] 1:48 PM 06/15/17

## 2017-06-15 NOTE — Patient Instructions (Addendum)
   Call me or e-mail me if you have any questions or concerns before your next appointment.  The radiation cancer doctor's office will call you to set up an appointment.  I will see you back in 3 months or sooner if needed.   TESTS ORDERED: 1. Serum alpha-1 antitrypsin phenotype today

## 2017-06-15 NOTE — Progress Notes (Signed)
PFT was attempted but could not be completed. Pt gave a very good effort but she could not perform the proper technique of any test. JN was made aware of this.

## 2017-06-16 ENCOUNTER — Ambulatory Visit: Payer: Self-pay

## 2017-06-16 ENCOUNTER — Encounter: Payer: Self-pay | Admitting: Radiation Oncology

## 2017-06-17 ENCOUNTER — Other Ambulatory Visit: Payer: Self-pay

## 2017-06-17 NOTE — Patient Outreach (Signed)
[facility]) Care Management  06/17/17  Kayla Kent 1932-06-02 [id number]  Successful outreach completed with patient. Patient identification verified.  Patient stated that she and her daughter met with Dr. Ashok Cordia and they revealed that a picture of her lung and showed her a little spot that they had found. Patient stated that "it was quite a shock for me and I am still rocking and rolling about what we are going to do." She stated that she has a follow up appointment on Tuesday and hopes that her daughter will be going with her to see the oncologist. She stated that she will not be having surgery and that Dr. Ashok Cordia told her not to have surgery as well. She stated that she will begin with radiation therapy, but she is unsure of what that entails. She stated that Dr. Lisbeth Renshaw will explain all of that at her appointment and make and assessment of her needs and then schedule it.  She stated that she did go to her exercise class yesterday and also saw her personal trainer to get help with her hip problem. She stated she sees him on Tuesdays and Thursdays at 0930 and has exercise class every day at 1330. She stated that this past Tuesday, she went to 2 classes. She stated that exercise has "been going pretty good."   She currently has no other questions or concerns at present. RNCM advised will make an additional outreach to assist patient with understanding her upcoming treatment and coordination of care, although patient has been managing her appointments and attending all follow ups well. She is agreeable to an additional outreach call.   Plan: RNCM to make a follow up call to patient within the next month to discuss her treatment options per her appointment with Dr. Lisbeth Renshaw and provide education and support as needed. Patient encouraged to call with any questions/concerns.  Kayla R. Jacksyn Beeks, RN, BSN, [place] Management Coordinator [phone]

## 2017-06-18 LAB — ALPHA-1 ANTITRYPSIN PHENOTYPE: A-1 Antitrypsin: 192 mg/dL (ref 83–199)

## 2017-06-21 ENCOUNTER — Ambulatory Visit
Admission: RE | Admit: 2017-06-21 | Discharge: 2017-06-21 | Disposition: A | Discharge status: Home / Self Care | Payer: Medicare Other | Source: Ambulatory Visit | Attending: Radiation Oncology | Admitting: Radiation Oncology

## 2017-06-21 ENCOUNTER — Encounter: Payer: Self-pay | Admitting: Radiation Oncology

## 2017-06-21 DIAGNOSIS — Z818 Family history of other mental and behavioral disorders: Secondary | ICD-10-CM | POA: Insufficient documentation

## 2017-06-21 DIAGNOSIS — E0789 Other specified disorders of thyroid: Secondary | ICD-10-CM | POA: Diagnosis not present

## 2017-06-21 DIAGNOSIS — Z8249 Family history of ischemic heart disease and other diseases of the circulatory system: Secondary | ICD-10-CM | POA: Diagnosis not present

## 2017-06-21 DIAGNOSIS — J439 Emphysema, unspecified: Secondary | ICD-10-CM | POA: Insufficient documentation

## 2017-06-21 DIAGNOSIS — Z510 Encounter for antineoplastic radiation therapy: Secondary | ICD-10-CM | POA: Insufficient documentation

## 2017-06-21 DIAGNOSIS — Z96641 Presence of right artificial hip joint: Secondary | ICD-10-CM | POA: Insufficient documentation

## 2017-06-21 DIAGNOSIS — Z885 Allergy status to narcotic agent status: Secondary | ICD-10-CM | POA: Insufficient documentation

## 2017-06-21 DIAGNOSIS — C3411 Malignant neoplasm of upper lobe, right bronchus or lung: Secondary | ICD-10-CM | POA: Insufficient documentation

## 2017-06-21 DIAGNOSIS — I10 Essential (primary) hypertension: Secondary | ICD-10-CM | POA: Diagnosis not present

## 2017-06-21 DIAGNOSIS — Z87891 Personal history of nicotine dependence: Secondary | ICD-10-CM | POA: Diagnosis not present

## 2017-06-21 DIAGNOSIS — R569 Unspecified convulsions: Secondary | ICD-10-CM | POA: Insufficient documentation

## 2017-06-21 DIAGNOSIS — Z7901 Long term (current) use of anticoagulants: Secondary | ICD-10-CM | POA: Diagnosis not present

## 2017-06-21 DIAGNOSIS — R918 Other nonspecific abnormal finding of lung field: Secondary | ICD-10-CM | POA: Diagnosis not present

## 2017-06-21 DIAGNOSIS — Z882 Allergy status to sulfonamides status: Secondary | ICD-10-CM | POA: Diagnosis not present

## 2017-06-21 DIAGNOSIS — Z91040 Latex allergy status: Secondary | ICD-10-CM | POA: Insufficient documentation

## 2017-06-21 DIAGNOSIS — Z888 Allergy status to other drugs, medicaments and biological substances status: Secondary | ICD-10-CM | POA: Insufficient documentation

## 2017-06-21 DIAGNOSIS — E042 Nontoxic multinodular goiter: Secondary | ICD-10-CM | POA: Insufficient documentation

## 2017-06-21 DIAGNOSIS — Z91018 Allergy to other foods: Secondary | ICD-10-CM | POA: Diagnosis not present

## 2017-06-21 DIAGNOSIS — F419 Anxiety disorder, unspecified: Secondary | ICD-10-CM | POA: Insufficient documentation

## 2017-06-21 DIAGNOSIS — Z880 Allergy status to penicillin: Secondary | ICD-10-CM | POA: Insufficient documentation

## 2017-06-21 DIAGNOSIS — Z79899 Other long term (current) drug therapy: Secondary | ICD-10-CM | POA: Diagnosis not present

## 2017-06-21 DIAGNOSIS — Z8261 Family history of arthritis: Secondary | ICD-10-CM | POA: Diagnosis not present

## 2017-06-21 DIAGNOSIS — Z9049 Acquired absence of other specified parts of digestive tract: Secondary | ICD-10-CM | POA: Diagnosis not present

## 2017-06-21 DIAGNOSIS — E785 Hyperlipidemia, unspecified: Secondary | ICD-10-CM | POA: Diagnosis not present

## 2017-06-21 DIAGNOSIS — Z91041 Radiographic dye allergy status: Secondary | ICD-10-CM | POA: Insufficient documentation

## 2017-06-21 DIAGNOSIS — I739 Peripheral vascular disease, unspecified: Secondary | ICD-10-CM | POA: Diagnosis not present

## 2017-06-21 NOTE — Progress Notes (Signed)
Thoracic Location of Tumor / Histology: Right Upper Lobe Nodule & Pulmonary Centrilobular Emphysema.   Patient presented  With Nausea  Biopsies of   Tobacco/Marijuana/Snuff/ETOH use: None Past/Anticipated interventions by cardiothoracic surgery, if any: . CARDIOVERSION N/A 11/05/2014   Procedure: CARDIOVERSION;  Surgeon: Candee Furbish, MD;  Location: [facility] ENDOSCOPY;  Service: Cardiovascular;  Laterality: N/A;  .        Past/Anticipated interventions by medical oncology, if any:  Signs/Symptoms  Weight changes, if any:  None  Respiratory complaints, if any:  None  Hemoptysis, if any:  None  Pain issues, if any:   None  SAFETY ISSUES:  Prior radiation?  None  Pacemaker/ICD?  No  Possible current pregnancy No   Is the patient on methotrexate?  None  Current Complaints   /other details: PET CT 05/26/17 (personally reviewed by me with patient today):  1.2 cm hypermetabolic right upper lobe nodule. 1.7 cm hypermetabolic left thyroid nodule. 2 mm left upper lobe nodule also noted. Right temporal lobe encephalomalacia. No pathologic mediastinal adenopathy or other areas of suggestive metastatic spread. No pleural effusion or thickening. No pericardial effusion. Small hiatal hernia present.  CT CHEST W/O 04/07/17 (previously reviewed by me): Centrally located right upper lobe 1.6 cm spiculated nodule. Subcentimeter groundglass nodule present within lateral segment right middle lobe as well. No other definite parenchymal opacity, mass, or nodule appreciated. Bilateral pleural effusions right greater than left with questionable mediastinal adenopathy which cannot be fully appreciated without contrast is disintegrating nature of the imaging. Mild apical centrilobular emphysema.  CARDIAC TTE (04/08/17):  LV normal in size with EF 55-60%. Elevated LV end-diastolic filling pressure. LA normal in size & RA mildly dilated. RV normal in size and function. No aortic stenosis with trivial  regurgitation. Aortic root normal in size. Mild mitral regurgitation. Mild pulmonic and tricuspid regurgitation. Trivial pericardial effusion.

## 2017-06-21 NOTE — Progress Notes (Signed)
Radiation Oncology         [phone] ________________________________  Name: Kayla Kent        MRN: [id number]  Date of Service: 06/21/2017 DOB: August 12, 1932  OJ:JKKX, Hunt Oris, MD  Javier Glazier, MD     REFERRING PHYSICIAN: Javier Glazier, MD   DIAGNOSIS: The encounter diagnosis was Malignant neoplasm of right upper lobe of lung [facility]).   HISTORY OF PRESENT ILLNESS: Kayla Kent is a 83 y.o. female seen at the request of Dr. Ashok Cordia for a newly noted hypermetabolic RUL lung lesion. The patient was initally found to have a RUL lung lesion on a CT scan on 04/07/17 during a work up for chest pain, revealing a spiculated 1.6 cm RUL lesion with no evidence of adenopathy. She was seen by cardiology as well and had a stress test on 04/22/17 that did not reveal inducible ischemia. A PET scan on 05/25/17 reveals a 1.2 x 1.2 cm right upper lobe with an SUV of 3.2. No adenopathy was noted. Bilateral hypodense thyroid nodules were noted with hypermetabolism of 11.5 SUV. She comes today to discuss options of stereotactic radiotherapy to the RUL without tissue confirmation.    PREVIOUS RADIATION THERAPY: No   PAST MEDICAL HISTORY:  Past Medical History:  Diagnosis Date  . Allergic rhinitis   . Anxiety   . Emphysema of lung ([place])   . HLD (hyperlipidemia)   . HTN (hypertension)   . Intolerance of drug    orthostatic  . Paroxysmal supraventricular tachycardia ([place])   . PVD (peripheral vascular disease) ([place])   . Seizure ([place])   . Syncope and collapse   . Uterine prolapse without mention of vaginal wall prolapse        PAST SURGICAL HISTORY: Past Surgical History:  Procedure Laterality Date  . CARDIOVERSION N/A 11/05/2014   Procedure: CARDIOVERSION;  Surgeon: Candee Furbish, MD;  Location: [facility] ENDOSCOPY;  Service: Cardiovascular;  Laterality: N/A;  . cataract surgery  08/2015  . CHOLECYSTECTOMY    . corrective eye surgery     as a child  . INTRAMEDULLARY (IM) NAIL INTERTROCHANTERIC  Left 11/21/2016   Procedure: INTRAMEDULLARY (IM) NAIL INTERTROCHANTRIC HEMI;  Surgeon: Newt Minion, MD;  Location: [facility];  Service: Orthopedics;  Laterality: Left;  . IVD removed    . OPEN REDUCTION INTERNAL FIXATION (ORIF) DISTAL RADIAL FRACTURE Right 08/29/2014   Procedure: OPEN REDUCTION INTERNAL FIXATION (ORIF) DISTAL RADIAL FRACTURE;  Surgeon: Marianna Payment, MD;  Location: [facility];  Service: Orthopedics;  Laterality: Right;  . RF ablation PSVT     summer '10  . stress cardiolite  08/05/93  . TONSILLECTOMY    . TOTAL HIP ARTHROPLASTY Right 08/29/2014   Procedure: Right Hip Hemi Arthroplasty;  Surgeon: Marianna Payment, MD;  Location: [facility];  Service: Orthopedics;  Laterality: Right;  Hip procedure 1st wants [employer], [employer], [place].      FAMILY HISTORY:  Family History  Problem Relation Age of Onset  . Mental illness Father        suicide  . Arthritis Father   . Hyperlipidemia Sister   . Hypertension Sister   . Heart disease Brother        CAD/MI  . Hypertension Brother   . Hypertension Unknown        family hx  . Colon cancer Neg Hx   . Breast cancer Neg Hx   . Diabetes Neg Hx   . Stroke Neg Hx   .  Cancer Neg Hx   . Lung disease Neg Hx      SOCIAL HISTORY:  reports that she quit smoking about 23 years ago. She started smoking about 65 years ago. She has a 9.50 pack-year smoking history. She has never used smokeless tobacco. She reports that she does not drink alcohol or use drugs.   ALLERGIES: Chocolate; Codeine; Diazepam; Fruit & vegetable daily [nutritional supplements]; Iohexol; Latex; Peach [prunus persica]; Peanut-containing drug products; Penicillins; Strawberry extract; Sulfonamide derivatives; Wheat; Wheat bran; Sulfamethoxazole; Crestor [rosuvastatin calcium]; and Iodine   MEDICATIONS:  Current Outpatient Prescriptions  Medication Sig Dispense Refill  . acetaminophen (TYLENOL) 500 MG tablet Take 1 tablet (500 mg total) by mouth every 6 (six)  hours as needed for mild pain. 30 tablet 0  . ALPRAZolam (XANAX) 0.25 MG tablet TAKE ONE TABLET BY MOUTH TWICE DAILY AS NEEDED 60 tablet 3  . amLODipine (NORVASC) 2.5 MG tablet TAKE ONE TABLET BY MOUTH ONCE DAILY 90 tablet 3  . atorvastatin (LIPITOR) 10 MG tablet TAKE ONE TABLET BY MOUTH ONCE DAILY AT 6 PM 30 tablet 10  . DILT-XR 180 MG 24 hr capsule TAKE 1 CAPSULE BY MOUTH ONCE DAILY 90 capsule 0  . DILT-XR 180 MG 24 hr capsule TAKE 1 CAPSULE BY MOUTH ONCE DAILY 90 capsule 2  . ELIQUIS 2.5 MG TABS tablet TAKE ONE TABLET BY MOUTH TWICE DAILY 180 tablet 3  . levETIRAcetam (KEPPRA) 250 MG tablet Take 1 tablet (250 mg total) by mouth 2 (two) times daily. 120 tablet 5  . pantoprazole (PROTONIX) 40 MG tablet Take 1 tablet (40 mg total) by mouth daily. 30 tablet 1  . sodium chloride (OCEAN) 0.65 % SOLN nasal spray Place 1 spray into both nostrils as needed for congestion.    . traMADol (ULTRAM) 50 MG tablet Take 1-2 tablets (50-100 mg total) by mouth every 6 (six) hours as needed for moderate pain or severe pain. 10 tablet 0   No current facility-administered medications for this encounter.      REVIEW OF SYSTEMS: On review of systems, the patient reports that she is doing well overall. She denies any chest pain, shortness of breath, cough, fevers, chills, night sweats, unintended weight changes. She reports occasional nausea and takes pepcid OTC. She denies any bowel or bladder disturbances, and denies abdominal pain, or vomiting. She denies any new musculoskeletal or joint aches or pains. A complete review of systems is obtained and is otherwise negative.     PHYSICAL EXAM:  Wt Readings from Last 3 Encounters:  06/21/17 107 lb 5 oz (48.7 kg)  06/15/17 110 lb (49.9 kg)  05/31/17 113 lb (51.3 kg)   Temp Readings from Last 3 Encounters:  06/21/17 97.7 F (36.5 C) (Oral)  05/31/17 98.4 F (36.9 C)  04/15/17 98.6 F (37 C) (Oral)   BP Readings from Last 3 Encounters:  06/21/17 (!) 138/55    06/15/17 118/76  05/31/17 118/64   Pulse Readings from Last 3 Encounters:  06/21/17 84  06/15/17 82  05/31/17 76   Pain Assessment Pain Score: 0-No pain/10  In general this is a well appearing caucasian female in no acute distress. She is alert and oriented x4 and appropriate throughout the examination. HEENT reveals that the patient is normocephalic, atraumatic. EOMs are intact. PERRLA. Skin is intact without any evidence of gross lesions. Cardiovascular exam reveals a regular rate and rhythm, no clicks rubs or murmurs are auscultated. Chest is clear to auscultation bilaterally. Lymphatic assessment is performed and  does not reveal any adenopathy in the cervical, supraclavicular, axillary, or inguinal chains. Abdomen has active bowel sounds in all quadrants and is intact. The abdomen is soft, non tender, non distended. Lower extremities are negative for pretibial pitting edema, deep calf tenderness, cyanosis or clubbing.   ECOG = 0  0 - Asymptomatic (Fully active, able to carry on all predisease activities without restriction)  1 - Symptomatic but completely ambulatory (Restricted in physically strenuous activity but ambulatory and able to carry out work of a light or sedentary nature. For example, light housework, office work)  2 - Symptomatic, <50% in bed during the day (Ambulatory and capable of all self care but unable to carry out any work activities. Up and about more than 50% of waking hours)  3 - Symptomatic, >50% in bed, but not bedbound (Capable of only limited self-care, confined to bed or chair 50% or more of waking hours)  4 - Bedbound (Completely disabled. Cannot carry on any self-care. Totally confined to bed or chair)  5 - Death   Eustace Pen MM, Creech RH, Tormey DC, et al. [id number]). "Toxicity and response criteria of the [facility]". [place] Oncol. 5 (6): 649-55    LABORATORY DATA:  Lab Results  Component Value Date   WBC 8.6 04/15/2017    HGB 12.8 04/15/2017   HCT 39.5 04/15/2017   MCV 89.6 04/15/2017   PLT 307 04/15/2017   Lab Results  Component Value Date   NA 139 04/15/2017   K 3.9 04/15/2017   CL 107 04/15/2017   CO2 24 04/15/2017   Lab Results  Component Value Date   ALT 26 04/13/2017   AST 16 04/13/2017   ALKPHOS 118 (H) 04/13/2017   BILITOT 0.7 04/13/2017      RADIOGRAPHY: Nm Pet Image Initial (pi) Skull Base To Thigh  Result Date: 05/26/2017 CLINICAL DATA:  Initial treatment strategy for right upper lobe lung nodule. EXAM: NUCLEAR MEDICINE PET SKULL BASE TO THIGH TECHNIQUE: 6.8 mCi F-18 FDG was injected intravenously. Full-ring PET imaging was performed from the skull base to thigh after the radiotracer. CT data was obtained and used for attenuation correction and anatomic localization. FASTING BLOOD GLUCOSE:  Value: 88 mg/dl COMPARISON:  Chest CT dated 04/07/2017 FINDINGS: NECK Hypodensity and associated photopenia anteriorly in the right middle cranial fossa probably relates to old encephalomalacia. Bilateral hypodense thyroid nodules are present, on the left side the solid-appearing nodule measures 1.7 by 1.3 cm and is hypermetabolic with maximum standard uptake value 11.5. No in indent hypermetabolic lymph nodes in the neck. CHEST The right upper lobe nodule of concern measures 1.2 by 1.2 cm on image 25/8 and has a maximum standard uptake value of 3.2. No hypermetabolic adenopathy in the chest. Coronary, aortic arch, and branch vessel atherosclerotic vascular disease. Mild moderate cardiomegaly. Small type 1 hiatal hernia. 2 mm left upper lobe nodule on image 32/8 appears stable and is not hypermetabolic but is below sensitive PET-CT size thresholds. ABDOMEN/PELVIS Scattered focal activity bowel is without well-defined CT correlate and probably physiologic. Cholecystectomy. Aortoiliac atherosclerotic vascular disease. No focal hypermetabolic lesions in the liver, spleen, or pancreas. Scattered colonic diverticulosis  is specially in the sigmoid colon. There is some apparent associated wall thickening in the sigmoid colon Pelvic floor laxity with low position of the vagina and anorectal junction, and probable protuberant cystocele along the vestibule. Aortoiliac atherosclerotic vascular disease. SKELETON Right hip prosthesis and left hip screw.  Bony demineralization. IMPRESSION: 1. 1.2 cm right  upper lobe nodule is abnormally hypermetabolic with maximum SUV 3.2, suspicious for malignancy. No regional hypermetabolic thoracic adenopathy observed. 2. Hypermetabolic 1.7 cm left thyroid nodule. A significant minority of hypermetabolic thyroid nodules turn out to be malignant, and thyroid ultrasound is recommended for further characterization. 3. Right temporal lobe encephalomalacia anteriorly. 4. Sigmoid diverticulosis with scattered diverticula elsewhere in the colon. 5. Pelvic floor laxity with cystocele. 6. Aortic Atherosclerosis (ICD10-I70.0). Coronary atherosclerosis with mild moderate cardiomegaly. Electronically Signed   By: Van Clines M.D.   On: 05/26/2017 08:37       IMPRESSION/PLAN: 1. Putative Stage IA, T1aN0M0 NSCLC of the RUL. Dr. Lisbeth Renshaw discusses the pathology findings and reviews the nature of putative early stage lung disease. Dr. Lisbeth Renshaw reviews the options for radiotherapy without tissue confirmation versus repeating an interval scan to document change, versus options for biopsy and or surgery. Given the patient's anticoagulation status, we weighed the risks and benefits of moving forward without tissue to confirm our suspicion. The patient is counseled on this and is interested in moving forward with SBRT without tissue sampling or waiting further. Dr. Lisbeth Renshaw discusses the delivery and logistics of radiotherapy and would anticipate 3-5 fractions to the right upper lobe. We will plan to move forward with simulation in the next week. Written consent is obtained and placed in the chart, a copy was provided to  the patient. 2. Hypermetabolic thyroid nodules. The patient is counseled on discussing this finding with her PCP. She will likely need a referral to general surgery and or ENT for further work up. She is in agreement with this plan and will follow up with her PCP.  The above documentation reflects my direct findings during this shared patient visit. Please see the separate note by Dr. Lisbeth Renshaw on this date for the remainder of the patient's plan of care.    Carola Rhine, PAC

## 2017-06-21 NOTE — Addendum Note (Signed)
Encounter addended by: Sherrlyn Hock, LPN on: 02/18/103 04:59 AM<BR>    Actions taken: Charge Capture section accepted

## 2017-06-24 ENCOUNTER — Other Ambulatory Visit: Payer: Self-pay

## 2017-06-24 ENCOUNTER — Other Ambulatory Visit: Payer: Self-pay | Admitting: Internal Medicine

## 2017-06-24 NOTE — Patient Outreach (Signed)
[facility]) Care Management  06/24/17  Kayla Kent 05/28/1932 [id number]  Attempted to reach patient without success. Phone rang multiple times with no answer and no option to leave a message. Will attempt to outreach again within next week.  [place] R. Elisandro Jarrett, RN, BSN, [place] Management Coordinator [phone]

## 2017-06-27 ENCOUNTER — Encounter (HOSPITAL_COMMUNITY): Payer: Self-pay

## 2017-06-27 ENCOUNTER — Other Ambulatory Visit: Payer: Self-pay

## 2017-06-27 ENCOUNTER — Emergency Department (HOSPITAL_COMMUNITY)
Admission: EM | Admit: 2017-06-27 | Discharge: 2017-06-27 | Disposition: A | Discharge status: Home / Self Care | Payer: Medicare Other | Attending: Emergency Medicine | Admitting: Emergency Medicine

## 2017-06-27 ENCOUNTER — Emergency Department (HOSPITAL_COMMUNITY): Payer: Medicare Other

## 2017-06-27 DIAGNOSIS — Z91010 Allergy to peanuts: Secondary | ICD-10-CM | POA: Insufficient documentation

## 2017-06-27 DIAGNOSIS — I110 Hypertensive heart disease with heart failure: Secondary | ICD-10-CM | POA: Insufficient documentation

## 2017-06-27 DIAGNOSIS — Z91040 Latex allergy status: Secondary | ICD-10-CM | POA: Diagnosis not present

## 2017-06-27 DIAGNOSIS — I5032 Chronic diastolic (congestive) heart failure: Secondary | ICD-10-CM | POA: Insufficient documentation

## 2017-06-27 DIAGNOSIS — R52 Pain, unspecified: Secondary | ICD-10-CM | POA: Diagnosis not present

## 2017-06-27 DIAGNOSIS — M62838 Other muscle spasm: Secondary | ICD-10-CM | POA: Insufficient documentation

## 2017-06-27 DIAGNOSIS — Z96641 Presence of right artificial hip joint: Secondary | ICD-10-CM | POA: Insufficient documentation

## 2017-06-27 DIAGNOSIS — R252 Cramp and spasm: Secondary | ICD-10-CM | POA: Diagnosis not present

## 2017-06-27 DIAGNOSIS — Z87891 Personal history of nicotine dependence: Secondary | ICD-10-CM | POA: Insufficient documentation

## 2017-06-27 DIAGNOSIS — Z79899 Other long term (current) drug therapy: Secondary | ICD-10-CM | POA: Diagnosis not present

## 2017-06-27 DIAGNOSIS — M25552 Pain in left hip: Secondary | ICD-10-CM | POA: Diagnosis not present

## 2017-06-27 MED ORDER — METHOCARBAMOL 500 MG PO TABS: 500.0000 mg | ORAL_TABLET | Freq: Every evening | ORAL | 0 refills | Status: DC | PRN

## 2017-06-27 MED ORDER — ACETAMINOPHEN 500 MG PO TABS
1000.0000 mg | ORAL_TABLET | Freq: Once | ORAL | Status: AC
  Administered 2017-06-27: 1000 mg via ORAL
  Filled 2017-06-27: qty 2

## 2017-06-27 MED ORDER — ATORVASTATIN CALCIUM 10 MG PO TABS: ORAL_TABLET | ORAL | 3 refills | Status: DC

## 2017-06-27 MED ORDER — KETOROLAC TROMETHAMINE 15 MG/ML IJ SOLN
10.0000 mg | Freq: Once | INTRAMUSCULAR | Status: AC
  Administered 2017-06-27: 10 mg via INTRAVENOUS
  Filled 2017-06-27: qty 1

## 2017-06-27 MED ORDER — IBUPROFEN 600 MG PO TABS: 600.0000 mg | ORAL_TABLET | Freq: Four times a day (QID) | ORAL | 0 refills | Status: DC | PRN

## 2017-06-27 MED ORDER — ACETAMINOPHEN 500 MG PO TABS: 1000.0000 mg | ORAL_TABLET | Freq: Three times a day (TID) | ORAL | 0 refills | Status: AC

## 2017-06-27 MED ORDER — METHOCARBAMOL 1000 MG/10ML IJ SOLN
500.0000 mg | Freq: Once | INTRAVENOUS | Status: AC
  Administered 2017-06-27: 500 mg via INTRAVENOUS
  Filled 2017-06-27: qty 550

## 2017-06-27 MED ORDER — METHOCARBAMOL 1000 MG/10ML IJ SOLN: 500.0000 mg | Freq: Once | INTRAMUSCULAR | Status: DC

## 2017-06-27 NOTE — ED Triage Notes (Signed)
Pt BIB [facility] c/o L hip pain and L thigh pain since when she woke up this morning. She reports hx of L hip surgery in Feb and states that she has had no issues since. She denies any recent injury or anything to explain this pain. 50 mcg fentanyl given en route. A&Ox4. Hx afib.

## 2017-06-27 NOTE — ED Notes (Signed)
Bed: WA06 Expected date:  Expected time:  Means of arrival:  Comments: EMS-hip pain

## 2017-06-27 NOTE — ED Provider Notes (Signed)
[facility] DEPT Provider Note   CSN: [id number] Arrival date & time: 06/27/17  1810     History   Chief Complaint Chief Complaint  Patient presents with  . Hip Pain    L  . Leg Pain    L    HPI Kayla Kent is a 83 y.o. female.  HPI  83 year old female presents to the emergency department with 1 day of right-sided pain that began [place] morning. Pain has been constantly dull in nature with intermittent cramping in various areas of the thigh. Pain is exacerbated with movement and palpation. Relieved by lying still. He has not tried taking any medication today. She denies any recent trauma. Patient did have hip replacement February of this year without any competitions. Patient has been doing physical exercises at a local facility. Reports that she did her typical exercises yesterday without any complication. Denies any recent fevers or infections. No associated swelling. No lower back pain or radicular pain. No urinary or bowel incontinence.   Denies any other physical complaints. No other alleviating or aggravating factors.  Past Medical History:  Diagnosis Date  . Allergic rhinitis   . Anxiety   . Emphysema of lung ([place])   . HLD (hyperlipidemia)   . HTN (hypertension)   . Intolerance of drug    orthostatic  . Paroxysmal supraventricular tachycardia ([place])   . PVD (peripheral vascular disease) ([place])   . Seizure ([place])   . Syncope and collapse   . Uterine prolapse without mention of vaginal wall prolapse     Patient Active Problem List   Diagnosis Date Noted  . Malignant neoplasm of right upper lobe of lung ([place]) 06/21/2017  . Pulmonary emphysema ([place]) 04/26/2017  . Pleural effusion 04/26/2017  . Near syncope   . Gastroesophageal reflux disease   . Chest pain 04/14/2017  . UTI (urinary tract infection) 04/12/2017  . Lung nodule < 6cm on CT 04/12/2017  . Acute renal failure ([place])   . Sepsis ([place]) 04/06/2017  . Atrial fibrillation with RVR ([place]) 04/06/2017  .  Urinary frequency 03/03/2017  . Hemorrhoids 03/03/2017  . Insomnia 03/03/2017  . Postoperative anemia due to acute blood loss 11/24/2016  . Hematoma left thigh 11/24/2016  . Closed femur fracture ([place]) 11/20/2016  . Pedal edema 10/10/2016  . Persistent atrial fibrillation ([place]) 12/11/2014  . S/P ablation of atrial fibrillation 12/11/2014  . Orthostatic hypotension 12/11/2014  . Paroxysmal SVT (supraventricular tachycardia) ([place]) 12/11/2014  . Atherosclerotic peripheral vascular disease ([place]) 12/11/2014  . PAF (paroxysmal atrial fibrillation) ([place]) 11/05/2014  . Orthostasis 10/03/2014  . Non-compliant behavior 10/01/2014  . TIA (transient ischemic attack) 09/23/2014  . Numbness of left hand   . Acute on chronic diastolic heart failure ([place]) 09/21/2014  . Fracture of femoral neck, right ([place]) 08/28/2014  . Distal radius fracture, right 08/28/2014  . Hip fracture requiring operative repair ([place]) 08/28/2014  . Paroxysmal spells 03/28/2014  . Seizures ([place]) 03/28/2013  . Syncope and collapse 07/27/2012  . Routine health maintenance 12/12/2011  . PERIPHERAL CIRCULATORY DISORDER 04/10/2010  . Chronic diastolic CHF (congestive heart failure) ([place]) 07/17/2009  . Depression 07/04/2009  . Anxiety 01/19/2008  . HLD (hyperlipidemia) 11/14/2007  . Essential hypertension 11/14/2007  . PAROXYSMAL ATRIAL TACHYCARDIA 11/14/2007  . UTERINE PROLAPSE 11/14/2007    Past Surgical History:  Procedure Laterality Date  . CARDIOVERSION N/A 11/05/2014   Procedure: CARDIOVERSION;  Surgeon: Candee Furbish, MD;  Location: [facility] ENDOSCOPY;  Service: Cardiovascular;  Laterality: N/A;  . cataract surgery  08/2015  . CHOLECYSTECTOMY    . corrective eye surgery     as a child  . INTRAMEDULLARY (IM) NAIL INTERTROCHANTERIC Left 11/21/2016   Procedure: INTRAMEDULLARY (IM) NAIL INTERTROCHANTRIC HEMI;  Surgeon: Newt Minion, MD;  Location: [facility];  Service: Orthopedics;  Laterality: Left;  . IVD removed    . OPEN  REDUCTION INTERNAL FIXATION (ORIF) DISTAL RADIAL FRACTURE Right 08/29/2014   Procedure: OPEN REDUCTION INTERNAL FIXATION (ORIF) DISTAL RADIAL FRACTURE;  Surgeon: Marianna Payment, MD;  Location: [facility];  Service: Orthopedics;  Laterality: Right;  . RF ablation PSVT     summer '10  . stress cardiolite  08/05/93  . TONSILLECTOMY    . TOTAL HIP ARTHROPLASTY Right 08/29/2014   Procedure: Right Hip Hemi Arthroplasty;  Surgeon: Marianna Payment, MD;  Location: [facility];  Service: Orthopedics;  Laterality: Right;  Hip procedure 1st wants Peg Board, [employer], [place].     OB History    No data available       Home Medications    Prior to Admission medications   Medication Sig Start Date End Date Taking? Authorizing Provider  acetaminophen (TYLENOL) 500 MG tablet Take 1 tablet (500 mg total) by mouth every 6 (six) hours as needed for mild pain. 11/22/16  Yes Newt Minion, MD  ALPRAZolam Duanne Moron) 0.25 MG tablet TAKE ONE TABLET BY MOUTH TWICE DAILY AS NEEDED Patient taking differently: TAKE ONE TABLET BY MOUTH TWICE DAILY AS NEEDED FOR ANXIETY 03/22/17  Yes Biagio Borg, MD  amLODipine (NORVASC) 2.5 MG tablet TAKE ONE TABLET BY MOUTH ONCE DAILY 04/01/17  Yes Fay Records, MD  atorvastatin (LIPITOR) 10 MG tablet TAKE ONE TABLET BY MOUTH ONCE DAILY AT 6 PM 06/27/17  Yes Bhagat, Bhavinkumar, PA  DILT-XR 180 MG 24 hr capsule TAKE 1 CAPSULE BY MOUTH ONCE DAILY 06/10/17  Yes Biagio Borg, MD  ELIQUIS 2.5 MG TABS tablet TAKE ONE TABLET BY MOUTH TWICE DAILY 10/08/16  Yes Fay Records, MD  levETIRAcetam (KEPPRA) 250 MG tablet Take 1 tablet (250 mg total) by mouth 2 (two) times daily. 05/09/17  Yes Dohmeier, Asencion Partridge, MD  pantoprazole (PROTONIX) 40 MG tablet Take 1 tablet (40 mg total) by mouth daily. 04/15/17  Yes Barton Dubois, MD  sodium chloride (OCEAN) 0.65 % SOLN nasal spray Place 1 spray into both nostrils as needed for congestion.   Yes [provider]  traMADol (ULTRAM) 50 MG tablet  Take 1-2 tablets (50-100 mg total) by mouth every 6 (six) hours as needed for moderate pain or severe pain. 11/25/16  Yes Rama, Venetia Maxon, MD  traZODone (DESYREL) 50 MG tablet Take 50 mg by mouth at bedtime.  06/07/17  Yes [provider]  DILT-XR 180 MG 24 hr capsule TAKE 1 CAPSULE BY MOUTH ONCE DAILY Patient not taking: Reported on 06/27/2017 05/26/17   Biagio Borg, MD    Family History Family History  Problem Relation Age of Onset  . Mental illness Father        suicide  . Arthritis Father   . Hyperlipidemia Sister   . Hypertension Sister   . Heart disease Brother        CAD/MI  . Hypertension Brother   . Hypertension Unknown        family hx  . Colon cancer Neg Hx   . Breast cancer Neg Hx   . Diabetes Neg Hx   . Stroke Neg Hx   . Cancer Neg  Hx   . Lung disease Neg Hx     Social History Social History  Substance Use Topics  . Smoking status: Former Smoker    Packs/day: 0.25    Years: 38.00    Start date: 02/15/1952    Quit date: 10/18/1993  . Smokeless tobacco: Never Used  . Alcohol use No     Allergies   Chocolate; Codeine; Diazepam; Fruit & vegetable daily [nutritional supplements]; Iohexol; Latex; Peach [prunus persica]; Peanut-containing drug products; Penicillins; Strawberry extract; Sulfonamide derivatives; Wheat; Wheat bran; Sulfamethoxazole; Crestor [rosuvastatin calcium]; and Iodine   Review of Systems Review of Systems All other systems are reviewed and are negative for acute change except as noted in the HPI   Physical Exam Updated Vital Signs BP (!) 152/56 (BP Location: Right Arm)   Pulse 71   Temp (!) 96.7 F (35.9 C) (Oral)   Resp 17   SpO2 98%   Physical Exam  Constitutional: She is oriented to person, place, and time. She appears well-developed and well-nourished. No distress.  HENT:  Head: Normocephalic and atraumatic.  Nose: Nose normal.  Eyes: Pupils are equal, round, and reactive to light. Conjunctivae and EOM are normal. Right  eye exhibits no discharge. Left eye exhibits no discharge. No scleral icterus.  Neck: Normal range of motion. Neck supple.  Cardiovascular: Normal rate and regular rhythm.  Exam reveals no gallop and no friction rub.   No murmur heard. Pulmonary/Chest: Effort normal and breath sounds normal. No stridor. No respiratory distress. She has no rales.  Abdominal: Soft. She exhibits no distension. There is no tenderness.  Musculoskeletal: She exhibits no edema.       Left hip: She exhibits tenderness.       Lumbar back: She exhibits no tenderness and no bony tenderness.       Legs: Neurological: She is alert and oriented to person, place, and time.  Skin: Skin is warm and dry. No rash noted. She is not diaphoretic. No erythema.  Psychiatric: She has a normal mood and affect.  Vitals reviewed.    ED Treatments / Results  Labs (all labs ordered are listed, but only abnormal results are displayed) Labs Reviewed - No data to display  EKG  EKG Interpretation None       Radiology Dg Hip Unilat With Pelvis 2-3 Views Left  Result Date: 06/27/2017 CLINICAL DATA:  Left hip pain. EXAM: DG HIP (WITH OR WITHOUT PELVIS) 2-3V LEFT COMPARISON:  11/21/2016 FINDINGS: Evidence of prior left intertrochanteric fracture and internal fixation. Prior right hip replacement. No acute bony abnormality. Specifically, no fracture, subluxation, or dislocation. Soft tissues are intact. IMPRESSION: No acute bony abnormality. Electronically Signed   By: Rolm Baptise M.D.   On: 06/27/2017 19:28   Dg Femur 1v Left  Result Date: 06/27/2017 CLINICAL DATA:  Left hip pain. EXAM: LEFT FEMUR 1 VIEW COMPARISON:  11/27/2016 FINDINGS: Evidence of remote intertrochanteric fracture with internal fixation. No acute fracture, subluxation or dislocation. IMPRESSION: No acute bony abnormality. Electronically Signed   By: Rolm Baptise M.D.   On: 06/27/2017 19:27    Procedures Procedures (including critical care time)  Medications  Ordered in ED Medications  acetaminophen (TYLENOL) tablet 1,000 mg (1,000 mg Oral Given 06/27/17 2107)  ketorolac (TORADOL) 15 MG/ML injection 10 mg (10 mg Intravenous Given 06/27/17 2107)  methocarbamol (ROBAXIN) 500 mg in dextrose 5 % 50 mL IVPB (0 mg Intravenous Stopped 06/27/17 2137)     Initial Impression / Assessment and Plan / ED  Course  I have reviewed the triage vital signs and the nursing notes.  Pertinent labs & imaging results that were available during my care of the patient were reviewed by me and considered in my medical decision making (see chart for details).     Plain film obtained at triage without evidence of hardware instability. Presentation most likely secondary to muscle spasm for recent exercises. No evidence to suggest cauda equina. Low suspicion for DVT. Patient treated with Tylenol, Toradol and Robaxin with significant improvement in her symptomatology. Patient was able to ambulate without complication.  The patient is safe for discharge with strict return precautions.   Final Clinical Impressions(s) / ED Diagnoses   Final diagnoses:  Leg cramps  Muscle spasm   Disposition: Discharge  Condition: Good  I have discussed the results, Dx and Tx plan with the patient And daughter who expressed understanding and agree(s) with the plan. Discharge instructions discussed at great length. The patient and daughter were given strict return precautions who verbalized understanding of the instructions. No further questions at time of discharge.    Discharge Medication List as of 06/27/2017 11:10 PM    START taking these medications   Details  ibuprofen (ADVIL,MOTRIN) 600 MG tablet Take 1 tablet (600 mg total) by mouth every 6 (six) hours as needed., Starting Mon 06/27/2017, Print    methocarbamol (ROBAXIN) 500 MG tablet Take 1 tablet (500 mg total) by mouth at bedtime as needed for muscle spasms., Starting Mon 06/27/2017, Print        Follow Up: Biagio Borg,  MD [address] [place] [address] [phone]  Schedule an appointment as soon as possible for a visit in 1 week If symptoms do not improve or  worsen      Tiago Humphrey, Grayce Sessions, MD 06/28/17 [phone]

## 2017-06-27 NOTE — ED Notes (Signed)
Pt walked to and from restroom using IV pole for assistance. She was able to squat down and get up without my physical assistance (stand-by only).

## 2017-06-28 ENCOUNTER — Other Ambulatory Visit: Payer: Self-pay | Admitting: Internal Medicine

## 2017-06-28 ENCOUNTER — Telehealth: Payer: Self-pay | Admitting: Internal Medicine

## 2017-06-28 ENCOUNTER — Ambulatory Visit
Admission: RE | Admit: 2017-06-28 | Discharge: 2017-06-28 | Disposition: A | Discharge status: Home / Self Care | Payer: Medicare Other | Source: Ambulatory Visit | Attending: Radiation Oncology | Admitting: Radiation Oncology

## 2017-06-28 DIAGNOSIS — C3411 Malignant neoplasm of upper lobe, right bronchus or lung: Secondary | ICD-10-CM | POA: Diagnosis not present

## 2017-06-28 DIAGNOSIS — E785 Hyperlipidemia, unspecified: Secondary | ICD-10-CM | POA: Diagnosis not present

## 2017-06-28 DIAGNOSIS — Z510 Encounter for antineoplastic radiation therapy: Secondary | ICD-10-CM | POA: Diagnosis not present

## 2017-06-28 DIAGNOSIS — E042 Nontoxic multinodular goiter: Secondary | ICD-10-CM | POA: Diagnosis not present

## 2017-06-28 DIAGNOSIS — F419 Anxiety disorder, unspecified: Secondary | ICD-10-CM | POA: Diagnosis not present

## 2017-06-28 DIAGNOSIS — J439 Emphysema, unspecified: Secondary | ICD-10-CM | POA: Diagnosis not present

## 2017-06-28 DIAGNOSIS — R9402 Abnormal brain scan: Secondary | ICD-10-CM

## 2017-06-28 MED ORDER — PANTOPRAZOLE SODIUM 40 MG PO TBEC: 40.0000 mg | DELAYED_RELEASE_TABLET | Freq: Every day | ORAL | 1 refills | Status: DC

## 2017-06-28 NOTE — Progress Notes (Signed)
[facility] Simulation and Treatment Planning Note   Name:  Kayla Kent MRN: [id number]   Date: 06/28/2017  DOB: 1932/06/07  Status:outpatient    DIAGNOSIS:    ICD-10-CM   1. Malignant neoplasm of right upper lobe of lung ([place]) C34.11      CONSENT VERIFIED:yes   SET UP: Patient is setup supine   IMMOBILIZATION: The patient was immobilized using a Vac Loc bag and Abdominal Compression.   NARRATIVE:The patient was brought to the [facility].  Identity was confirmed.  All relevant records and images related to the planned course of therapy were reviewed.  Then, the patient was positioned in a stable reproducible clinical set-up for radiation therapy. Abdominal compression was applied.  4D CT images were obtained and reproducible breathing pattern was confirmed. Free breathing CT images were obtained.  Skin markings were placed.  The CT images were loaded into the planning software where the target and avoidance structures were contoured.  The radiation prescription was entered and confirmed.    TREATMENT PLANNING NOTE:  Treatment planning then occurred. I have requested : MLC's, isodose plan, basic dose calculation.  3 dimensional simulation is performed and dose volume histogram of the gross tumor volume, planning tumor volume and criticial normal structures including the spinal cord and lungs were analyzed and requested.  Special treatment procedure was performed due to high dose per fraction.  The patient will be monitored for increased risk of toxicity.  Daily imaging using cone beam CT will be used for target localization.  I anticipate that the patient will receive 54 Gy in 3 fractions to target volume. Further adjustments will be made based on the planning process is necessary.  ------------------------------------------------  Jodelle Gross, MD, PhD

## 2017-06-28 NOTE — Telephone Encounter (Signed)
Daughter called back stating mom is getting ready to start her radiation treatments, but Dr. Lisbeth Renshaw and pulmonologist is asking about the treatment plan for her thyroid. Daughter states mom had test done which showed she had a spot on her thyroid but they never heard anything else about it. Just want to make sure if she is needing anything done prior to starting radiation next week. Pls advise...Johny Chess

## 2017-06-28 NOTE — Telephone Encounter (Signed)
Pt daughter called stating the Pt needs a refill of pantoprazole (PROTONIX) 40 MG tablet [facility] [address] Please advise last see 8/14 for acute  Please advise    Daughter would also like a call back because the patient is having radiation on 9/19, and a couple months ago spots on her thyroid were found and they would like to know the treatment for this.  Please advise

## 2017-06-28 NOTE — Telephone Encounter (Signed)
Last TSH was April 06, 2017  - normal  It does not appear any changes need to be made, so I am not sure what else to say

## 2017-06-28 NOTE — Telephone Encounter (Signed)
Notified daughter w/MD response. Daughter stated that she saw PCP on 05/31/17 but was not mention by provider, and she wan't aware that they saw something on PET scan.Inform daughter once referral get set up she will received call from [facility] w/appt info.Marland KitchenJohny Chess

## 2017-06-28 NOTE — Telephone Encounter (Signed)
Notified daughter w/MD response. She states a PET scan was done and she think they may got the results from that. Reviewed report from 05/25/17 and it states below.Marland KitchenMarland KitchenDaughter stated they was told the treatment plan for her thyroid would come from her PCP. Pls advise.../lmb   " Hypermetabolic 1.7 cm left thyroid nodule. A significant minority"  of hypermetabolic thyroid nodules turn out to be malignant, and thyroid ultrasound is recommended for further characterization

## 2017-06-28 NOTE — Telephone Encounter (Signed)
Called daughter back no answer LMOM refill has been sent, but need additional information concerning 2nd msg. pls return call at your convenience .Marland KitchenJohny Chess

## 2017-06-28 NOTE — Telephone Encounter (Signed)
Confusion like this is best avoided in the future with an office visit  [place] for thyroid ultrasound - will order

## 2017-06-29 ENCOUNTER — Other Ambulatory Visit: Payer: Self-pay | Admitting: Internal Medicine

## 2017-06-29 DIAGNOSIS — R942 Abnormal results of pulmonary function studies: Secondary | ICD-10-CM

## 2017-07-01 ENCOUNTER — Other Ambulatory Visit: Payer: Self-pay

## 2017-07-01 DIAGNOSIS — E042 Nontoxic multinodular goiter: Secondary | ICD-10-CM | POA: Diagnosis not present

## 2017-07-01 DIAGNOSIS — F419 Anxiety disorder, unspecified: Secondary | ICD-10-CM | POA: Diagnosis not present

## 2017-07-01 DIAGNOSIS — Z510 Encounter for antineoplastic radiation therapy: Secondary | ICD-10-CM | POA: Diagnosis not present

## 2017-07-01 DIAGNOSIS — E785 Hyperlipidemia, unspecified: Secondary | ICD-10-CM | POA: Diagnosis not present

## 2017-07-01 DIAGNOSIS — C3411 Malignant neoplasm of upper lobe, right bronchus or lung: Secondary | ICD-10-CM | POA: Diagnosis not present

## 2017-07-01 DIAGNOSIS — J439 Emphysema, unspecified: Secondary | ICD-10-CM | POA: Diagnosis not present

## 2017-07-01 NOTE — Patient Outreach (Signed)
[facility]) Care Management   07/01/17  Kayla Kent 03/18/1932 [id number]  Second attempt to reach patient without success. Left HIPAA compliant voicemail with RNCM contact information and invited patient to return call.  [name], RN, BSN, [place] Management Coordinator [phone]

## 2017-07-04 ENCOUNTER — Other Ambulatory Visit: Payer: Self-pay

## 2017-07-04 ENCOUNTER — Encounter: Payer: Self-pay | Admitting: Nurse Practitioner

## 2017-07-04 ENCOUNTER — Other Ambulatory Visit: Payer: Medicare Other

## 2017-07-04 ENCOUNTER — Ambulatory Visit (INDEPENDENT_AMBULATORY_CARE_PROVIDER_SITE_OTHER): Payer: Medicare Other | Admitting: Nurse Practitioner

## 2017-07-04 VITALS — BP 154/62 | HR 80 | Temp 98.0°F | Ht 67.0 in | Wt 108.0 lb

## 2017-07-04 DIAGNOSIS — N3000 Acute cystitis without hematuria: Secondary | ICD-10-CM

## 2017-07-04 DIAGNOSIS — R350 Frequency of micturition: Secondary | ICD-10-CM

## 2017-07-04 DIAGNOSIS — M7062 Trochanteric bursitis, left hip: Secondary | ICD-10-CM | POA: Diagnosis not present

## 2017-07-04 LAB — POCT URINALYSIS DIPSTICK
Bilirubin, UA: NEGATIVE
GLUCOSE UA: NEGATIVE
Ketones, UA: NEGATIVE
Nitrite, UA: NEGATIVE
PH UA: 6 (ref 5.0–8.0)
PROTEIN UA: NEGATIVE
RBC UA: NEGATIVE
SPEC GRAV UA: 1.015 (ref 1.010–1.025)
UROBILINOGEN UA: 0.2 U/dL

## 2017-07-04 MED ORDER — METHYLPREDNISOLONE ACETATE 40 MG/ML IJ SUSP
20.0000 mg | Freq: Once | INTRAMUSCULAR | Status: AC
  Administered 2017-07-04: 20 mg via INTRA_ARTICULAR

## 2017-07-04 NOTE — Addendum Note (Signed)
Addended by: Wilfred Lacy L on: 07/04/2017 01:02 PM   Modules accepted: Orders

## 2017-07-04 NOTE — Progress Notes (Addendum)
Subjective:  Patient ID: SENORA LACSON, female    DOB: 05-13-1932  Age: 83 y.o. MRN: [id number]  CC: Hospitalization Follow-up (hospital follow up--not better--legs spasm- hospital was going to order homehealth--node on neck consult--need this for radiation treatment?); Urinary Tract Infection (frequent urinate--burning--1 wk); and Dizziness (woke up this morning felt dizzy, felt like her heat doesnt beat right (dx a fib). )   Hip Pain   The incident occurred more than 1 week ago. There was no injury mechanism. The pain is present in the left hip. The quality of the pain is described as aching. The pain has been fluctuating since onset. Associated symptoms include muscle weakness. Pertinent negatives include no inability to bear weight, loss of motion, loss of sensation, numbness or tingling. The symptoms are aggravated by movement, weight bearing and palpation. She has tried acetaminophen, NSAIDs, rest and heat (and robaxin) for the symptoms.  Urinary Frequency   This is a new problem. The current episode started today. The problem occurs every urination. The problem has been unchanged. The pain is at a severity of 0/10. The patient is experiencing no pain. There has been no fever. She is not sexually active. There is no history of pyelonephritis. Associated symptoms include frequency. Pertinent negatives include no chills, hematuria, nausea or urgency. There is no history of catheterization, recurrent UTIs or urinary stasis.   Left hip evaluated  at ED 1week ago, no acute finding on x-ray. Denies any mechanism of injury.  Hx of left femur fracture with internal fixation 2015.  Outpatient Medications Prior to Visit  Medication Sig Dispense Refill  . ALPRAZolam (XANAX) 0.25 MG tablet TAKE ONE TABLET BY MOUTH TWICE DAILY AS NEEDED (Patient taking differently: TAKE ONE TABLET BY MOUTH TWICE DAILY AS NEEDED FOR ANXIETY) 60 tablet 3  . amLODipine (NORVASC) 2.5 MG tablet TAKE ONE TABLET BY MOUTH  ONCE DAILY 90 tablet 3  . atorvastatin (LIPITOR) 10 MG tablet TAKE ONE TABLET BY MOUTH ONCE DAILY AT 6 PM 90 tablet 3  . DILT-XR 180 MG 24 hr capsule TAKE 1 CAPSULE BY MOUTH ONCE DAILY 90 capsule 0  . DILT-XR 180 MG 24 hr capsule TAKE 1 CAPSULE BY MOUTH ONCE DAILY 90 capsule 2  . ELIQUIS 2.5 MG TABS tablet TAKE ONE TABLET BY MOUTH TWICE DAILY 180 tablet 3  . ibuprofen (ADVIL,MOTRIN) 600 MG tablet Take 1 tablet (600 mg total) by mouth every 6 (six) hours as needed. 30 tablet 0  . levETIRAcetam (KEPPRA) 250 MG tablet Take 1 tablet (250 mg total) by mouth 2 (two) times daily. 120 tablet 5  . methocarbamol (ROBAXIN) 500 MG tablet Take 1 tablet (500 mg total) by mouth at bedtime as needed for muscle spasms. 20 tablet 0  . pantoprazole (PROTONIX) 40 MG tablet Take 1 tablet (40 mg total) by mouth daily. 90 tablet 1  . sodium chloride (OCEAN) 0.65 % SOLN nasal spray Place 1 spray into both nostrils as needed for congestion.    . traMADol (ULTRAM) 50 MG tablet Take 1-2 tablets (50-100 mg total) by mouth every 6 (six) hours as needed for moderate pain or severe pain. 10 tablet 0  . traZODone (DESYREL) 50 MG tablet Take 50 mg by mouth at bedtime.      No facility-administered medications prior to visit.     ROS See HPI  Objective:  BP (!) 154/62   Pulse 80   Temp 98 F (36.7 C)   Ht 5\' 7"  (1.702 m)   Wt  108 lb (49 kg)   SpO2 99%   BMI 16.92 kg/m   BP Readings from Last 3 Encounters:  07/04/17 (!) 154/62  06/27/17 (!) 152/56  06/21/17 (!) 138/55    Wt Readings from Last 3 Encounters:  07/04/17 108 lb (49 kg)  06/21/17 107 lb 5 oz (48.7 kg)  06/15/17 110 lb (49.9 kg)    Physical Exam  Constitutional: She is oriented to person, place, and time. No distress.  Cardiovascular: Normal rate.   Pulmonary/Chest: Effort normal.  Musculoskeletal: She exhibits tenderness. She exhibits no edema or deformity.       Right hip: Normal.       Left hip: She exhibits tenderness. She exhibits normal  range of motion, normal strength and no deformity.       Lumbar back: Normal.  Tenderness over left trochanter bursa.  Neurological: She is alert and oriented to person, place, and time.  Skin: Skin is warm and dry. No rash noted.  Psychiatric: She has a normal mood and affect. Her behavior is normal.  Vitals reviewed.   Lab Results  Component Value Date   WBC 8.6 04/15/2017   HGB 12.8 04/15/2017   HCT 39.5 04/15/2017   PLT 307 04/15/2017   GLUCOSE 90 04/15/2017   CHOL 132 10/30/2015   TRIG 71 10/30/2015   HDL 33 (L) 10/30/2015   LDLDIRECT 175.3 01/19/2008   LDLCALC 85 10/30/2015   ALT 26 04/13/2017   AST 16 04/13/2017   NA 139 04/15/2017   K 3.9 04/15/2017   CL 107 04/15/2017   CREATININE 1.07 (H) 04/15/2017   BUN 15 04/15/2017   CO2 24 04/15/2017   TSH 1.110 04/06/2017   INR 1.59 04/06/2017   HGBA1C 6.0 04/13/2017    Procedure Note :     Procedure : Joint Injection: left trochanter bursa   Indication:  Joint osteoarthritis with refractory  chronic pain.   Risks including unsuccessful procedure , bleeding, infection, bruising, skin atrophy, "steroid flare-up" and others were explained to the patient in detail as well as the benefits. Verbal consent was obtained.  The patient was placed in a comfortable position. Lateral approach was used. Skin was prepped with alcohol (allergy to iodine) and anesthetized a cooling spray. Then, a 10cc syringe with a 1.5 inch long 25-gauge needle was used for a joint injection. The needle was advanced  Into the left lateral hip joint cavity. Syringe plunger was pulled back to confirm correct placement of the needle and injected the joint with 32mL of 2% lidocaine and 20mg  of Depo-Medrol .  Band-Aid was applied.   Tolerated well. Complications: None. Good pain relief following the procedure.   Postprocedure instructions :    A Band-Aid should be left on for 12 hours. Injection therapy is not a cure itself. It is used in conjunction with  other modalities. You can use nonsteroidal anti-inflammatories like ibuprofen , hot and cold compresses. Rest is recommended in the next 24 hours. You need to report immediately  if fever, chills or any signs of infection develop.   Assessment & Plan:   Eliora was seen today for hospitalization follow-up, urinary tract infection and dizziness.  Diagnoses and all orders for this visit:  Greater trochanteric bursitis of left hip -     methylPREDNISolone acetate (DEPO-MEDROL) injection 20 mg; Inject 0.5 mLs (20 mg total) into the articular space once.  Increased urinary frequency -     POCT urinalysis dipstick -     Urine Culture; Future  I am having Ms. Kulak maintain her ELIQUIS, traMADol, ALPRAZolam, amLODipine, sodium chloride, levETIRAcetam, DILT-XR, DILT-XR, atorvastatin, traZODone, methocarbamol, ibuprofen, and pantoprazole. We will continue to administer methylPREDNISolone acetate.  Meds ordered this encounter  Medications  . methylPREDNISolone acetate (DEPO-MEDROL) injection 20 mg    Follow-up: Return if symptoms worsen or fail to improve.  Wilfred Lacy, NP

## 2017-07-04 NOTE — Patient Instructions (Addendum)
REST JOINT FOR 24HRS AFTER INJECTION. USE COLD COMPRESS EVERY 2HRS (10-15MINS AT A TIME) X 24HRS WHILE AWAKE. TOMORROW YOU CAN RESUME WARM COMPRESS AS NEEDED. HOLD IBUPROFEN USE. CONTINUE TYLENOL AND ROBAXIN AS PRESCRIBED.  YOUR URINE HAS BEEN SENT FOR URINE CULTURE.  Refer to ortho if no improvement of left hip pain.  Trochanteric Bursitis Trochanteric bursitis is a condition that causes hip pain. Trochanteric bursitis happens when fluid-filled sacs (bursae) in the hip get irritated. Normally these sacs absorb shock and help strong bands of tissue (tendons) in your hip glide smoothly over each other and over your hip bones. What are the causes? This condition results from increased friction between the hip bones and the tendons that go over them. This condition can happen if you:  Have weak hips.  Use your hip muscles too much (overuse).  Get hit in the hip.  What increases the risk? This condition is more likely to develop in:  Women.  Adults who are middle-aged or older.  People with arthritis or a spinal condition.  People with weak buttocks muscles (gluteal muscles).  People who have one leg that is shorter than the other.  People who participate in certain kinds of athletic activities, such as: ? Running sports, especially long-distance running. ? Contact sports, like football or martial arts. ? Sports in which falls may occur, like skiing.  What are the signs or symptoms? The main symptom of this condition is pain and tenderness over the point of your hip. The pain may be:  Sharp and intense.  Dull and achy.  Felt on the outside of your thigh.  It may increase when you:  Lie on your side.  Walk or run.  Go up on stairs.  Sit.  Stand up after sitting.  Stand for long periods of time.  How is this diagnosed? This condition may be diagnosed based on:  Your symptoms.  Your medical history.  A physical exam.  Imaging tests, such as: ? X-rays  to check your bones. ? An MRI or ultrasound to check your tendons and muscles.  During your physical exam, your health care provider will check the movement and strength of your hip. He or she may press on the point of your hip to check for pain. How is this treated? This condition may be treated by:  Resting.  Reducing your activity.  Avoiding activities that cause pain.  Using crutches, a cane, or a walker to decrease the strain on your hip.  Taking medicine to help with swelling.  Having medicine injected into the bursae to help with swelling.  Using ice, heat, and massage therapy for pain relief.  Physical therapy exercises for strength and flexibility.  Surgery (rare).  Follow these instructions at home: Activity  Rest.  Avoid activities that cause pain.  Return to your normal activities as told by your health care provider. Ask your health care provider what activities are safe for you. Managing pain, stiffness, and swelling  Take over-the-counter and prescription medicines only as told by your health care provider.  If directed, apply heat to the injured area as told by your health care provider. ? Place a towel between your skin and the heat source. ? Leave the heat on for 20-30 minutes. ? Remove the heat if your skin turns bright red. This is especially important if you are unable to feel pain, heat, or cold. You may have a greater risk of getting burned.  If directed, apply ice to the  injured area: ? Put ice in a plastic bag. ? Place a towel between your skin and the bag. ? Leave the ice on for 20 minutes, 2-3 times a day. General instructions  If the affected leg is one that you use for driving, ask your health care provider when it is safe to drive.  Use crutches, a cane, or a walker as told by your health care provider.  If one of your legs is shorter than the other, get fitted for a shoe insert.  Lose weight if you are overweight. How is this  prevented?  Wear supportive footwear that is appropriate for your sport.  If you have hip pain, start any new exercise or sport slowly.  Maintain physical fitness, including: ? Strength. ? Flexibility. Contact a health care provider if:  Your pain does not improve with 2-4 weeks. Get help right away if:  You develop severe pain.  You have a fever.  You develop increased redness over your hip.  You have a change in your bowel function or bladder function.  You cannot control the muscles in your feet. This information is not intended to replace advice given to you by your health care provider. Make sure you discuss any questions you have with your health care provider. Document Released: 11/11/2004 Document Revised: 06/09/2016 Document Reviewed: 09/19/2015 Elsevier Interactive Patient Education  [employer].

## 2017-07-04 NOTE — Patient Outreach (Signed)
[place] [facility]) Care Management  07/04/17  Kayla Kent 03-19-1932 [id number]  Third and final outreach attempt completed without success. Left HIPAA compliant voicemail with RNCM contact information and requested callback.   Plan- RNCM will send outreach barrier letter and wait 10 business days before completing case closure if no return call has been received.  [name], RN, BSN, [place] Management Coordinator [phone]

## 2017-07-05 ENCOUNTER — Ambulatory Visit: Payer: Medicare Other | Admitting: Radiation Oncology

## 2017-07-06 ENCOUNTER — Ambulatory Visit
Admission: RE | Admit: 2017-07-06 | Discharge: 2017-07-06 | Disposition: A | Discharge status: Home / Self Care | Payer: Medicare Other | Source: Ambulatory Visit | Attending: Radiation Oncology | Admitting: Radiation Oncology

## 2017-07-06 DIAGNOSIS — F419 Anxiety disorder, unspecified: Secondary | ICD-10-CM | POA: Diagnosis not present

## 2017-07-06 DIAGNOSIS — C3411 Malignant neoplasm of upper lobe, right bronchus or lung: Secondary | ICD-10-CM | POA: Diagnosis not present

## 2017-07-06 DIAGNOSIS — E042 Nontoxic multinodular goiter: Secondary | ICD-10-CM | POA: Diagnosis not present

## 2017-07-06 DIAGNOSIS — J439 Emphysema, unspecified: Secondary | ICD-10-CM | POA: Diagnosis not present

## 2017-07-06 DIAGNOSIS — E785 Hyperlipidemia, unspecified: Secondary | ICD-10-CM | POA: Diagnosis not present

## 2017-07-06 DIAGNOSIS — Z510 Encounter for antineoplastic radiation therapy: Secondary | ICD-10-CM | POA: Diagnosis not present

## 2017-07-06 LAB — URINE CULTURE
MICRO NUMBER:: 81023608
SPECIMEN QUALITY:: ADEQUATE

## 2017-07-08 ENCOUNTER — Ambulatory Visit
Admission: RE | Admit: 2017-07-08 | Discharge: 2017-07-08 | Disposition: A | Discharge status: Home / Self Care | Payer: Medicare Other | Source: Ambulatory Visit | Attending: Radiation Oncology | Admitting: Radiation Oncology

## 2017-07-08 DIAGNOSIS — E785 Hyperlipidemia, unspecified: Secondary | ICD-10-CM | POA: Diagnosis not present

## 2017-07-08 DIAGNOSIS — Z510 Encounter for antineoplastic radiation therapy: Secondary | ICD-10-CM | POA: Diagnosis not present

## 2017-07-08 DIAGNOSIS — F419 Anxiety disorder, unspecified: Secondary | ICD-10-CM | POA: Diagnosis not present

## 2017-07-08 DIAGNOSIS — E042 Nontoxic multinodular goiter: Secondary | ICD-10-CM | POA: Diagnosis not present

## 2017-07-08 DIAGNOSIS — C3411 Malignant neoplasm of upper lobe, right bronchus or lung: Secondary | ICD-10-CM | POA: Diagnosis not present

## 2017-07-08 DIAGNOSIS — J439 Emphysema, unspecified: Secondary | ICD-10-CM | POA: Diagnosis not present

## 2017-07-12 ENCOUNTER — Ambulatory Visit: Payer: Medicare Other | Admitting: Radiation Oncology

## 2017-07-12 ENCOUNTER — Other Ambulatory Visit: Payer: Self-pay | Admitting: Internal Medicine

## 2017-07-12 DIAGNOSIS — E041 Nontoxic single thyroid nodule: Secondary | ICD-10-CM

## 2017-07-12 MED ORDER — CIPROFLOXACIN HCL 250 MG PO TABS: 250.0000 mg | ORAL_TABLET | Freq: Two times a day (BID) | ORAL | 0 refills | Status: DC

## 2017-07-12 NOTE — Telephone Encounter (Signed)
Faxed

## 2017-07-12 NOTE — Telephone Encounter (Signed)
Done hardcopy to Shir[place]  

## 2017-07-13 ENCOUNTER — Ambulatory Visit
Admission: RE | Admit: 2017-07-13 | Discharge: 2017-07-13 | Disposition: A | Discharge status: Home / Self Care | Payer: Medicare Other | Source: Ambulatory Visit | Attending: Radiation Oncology | Admitting: Radiation Oncology

## 2017-07-13 DIAGNOSIS — Z510 Encounter for antineoplastic radiation therapy: Secondary | ICD-10-CM | POA: Diagnosis not present

## 2017-07-13 DIAGNOSIS — E785 Hyperlipidemia, unspecified: Secondary | ICD-10-CM | POA: Diagnosis not present

## 2017-07-13 DIAGNOSIS — J439 Emphysema, unspecified: Secondary | ICD-10-CM | POA: Diagnosis not present

## 2017-07-13 DIAGNOSIS — F419 Anxiety disorder, unspecified: Secondary | ICD-10-CM | POA: Diagnosis not present

## 2017-07-13 DIAGNOSIS — E042 Nontoxic multinodular goiter: Secondary | ICD-10-CM | POA: Diagnosis not present

## 2017-07-13 DIAGNOSIS — C3411 Malignant neoplasm of upper lobe, right bronchus or lung: Secondary | ICD-10-CM | POA: Diagnosis not present

## 2017-07-14 ENCOUNTER — Encounter: Payer: Self-pay | Admitting: Radiation Oncology

## 2017-07-14 NOTE — Progress Notes (Signed)
  Radiation Oncology         [phone]) [phone] ________________________________  Name: Kayla Kent MRN: [id number]  Date: 07/14/2017  DOB: 1932/08/24  End of Treatment Note  Diagnosis:  Malignant neoplasm of the upper lobe, right bronchus or lung     Indication for treatment: Curative       Radiation treatment dates:  07/06/2017, 07/08/2017, 07/13/2017   Site/dose:  Lung / 54 Gy in 3 fractions  Beams/energy:  SBRT / 6X  Narrative: The patient tolerated radiation treatment relatively well.    Plan: The patient has completed radiation treatment. The patient will return to radiation oncology clinic for routine followup in one month. I advised them to call or return sooner if they have any questions or concerns related to their recovery or treatment.  ------------------------------------------------  Jodelle Gross, MD, PhD  This document serves as a record of services personally performed by Kyung Rudd, MD. It was created on his behalf by Valeta Harms, a trained medical scribe. The creation of this record is based on the scribe's personal observations and the provider's statements to them. This document has been checked and approved by the attending provider.

## 2017-07-15 ENCOUNTER — Telehealth: Payer: Self-pay | Admitting: Internal Medicine

## 2017-07-15 NOTE — Telephone Encounter (Signed)
Pt Is still having burning and still having to go often.  She feels like the uti is still not cleared up

## 2017-07-15 NOTE — Telephone Encounter (Signed)
Left detail massage infrom pt that we sent cipro to her pharmacy on 07/12/2017 for her to take.   Need to know if pt already pick this up?   Called pt multiple times and left vm.

## 2017-07-15 NOTE — Telephone Encounter (Signed)
Left vm for pt to call back, want to see if pt finish cipro already?

## 2017-07-16 NOTE — Telephone Encounter (Signed)
Pt called on Saturday. I have her your message. She said that she has not taken the prescription but will go pick it up today.

## 2017-07-18 ENCOUNTER — Other Ambulatory Visit: Payer: Self-pay | Admitting: Internal Medicine

## 2017-07-18 MED ORDER — METHOCARBAMOL 500 MG PO TABS: 500.0000 mg | ORAL_TABLET | Freq: Every evening | ORAL | 1 refills | Status: DC | PRN

## 2017-07-20 NOTE — Progress Notes (Signed)
Cardiology Office Note   Date:  07/21/2017   ID:  Kayla Kent, DOB 1932-07-05, MRN [id number]  PCP:  Biagio Borg, MD  Cardiologist:   Dorris Carnes, MD    F/U of atrial fib and HTn     History of Present Illness: Kayla Kent is a 83 y.o. female with a history of atrial fib and HTN I saw her in June 2018    She was doing good at the time     She was admitted later in the month with sepsis felt due to &TI  Admitted late in June with CP  She was in afib at time   Stress test negative for ischemia    Talking to her now she does not remember CP from this summer  Waves hands   Pt feels she may have a UTI  Sl burning with urination    She says she is on  second time of ABX   She denies CP  Breathing fine    No PND No dizziness   Current Meds  Medication Sig  . ALPRAZolam (XANAX) 0.25 MG tablet TAKE 1 TABLET BY MOUTH TWICE DAILY AS NEEDED  . amLODipine (NORVASC) 2.5 MG tablet TAKE ONE TABLET BY MOUTH ONCE DAILY  . atorvastatin (LIPITOR) 10 MG tablet TAKE ONE TABLET BY MOUTH ONCE DAILY AT 6 PM  . ciprofloxacin (CIPRO) 250 MG tablet Take 1 tablet (250 mg total) by mouth 2 (two) times daily.  Marland Kitchen DILT-XR 180 MG 24 hr capsule TAKE 1 CAPSULE BY MOUTH ONCE DAILY  . ELIQUIS 2.5 MG TABS tablet TAKE ONE TABLET BY MOUTH TWICE DAILY  . ibuprofen (ADVIL,MOTRIN) 600 MG tablet Take 1 tablet (600 mg total) by mouth every 6 (six) hours as needed.  . levETIRAcetam (KEPPRA) 250 MG tablet Take 1 tablet (250 mg total) by mouth 2 (two) times daily.  . methocarbamol (ROBAXIN) 500 MG tablet Take 1 tablet (500 mg total) by mouth at bedtime as needed for muscle spasms.  . pantoprazole (PROTONIX) 40 MG tablet Take 1 tablet (40 mg total) by mouth daily.  . sodium chloride (OCEAN) 0.65 % SOLN nasal spray Place 1 spray into both nostrils as needed for congestion.  . traMADol (ULTRAM) 50 MG tablet Take 1-2 tablets (50-100 mg total) by mouth every 6 (six) hours as needed for moderate pain or severe pain.  .  traZODone (DESYREL) 50 MG tablet Take 50 mg by mouth at bedtime.      Allergies:   Chocolate; Codeine; Diazepam; Fruit & vegetable daily [nutritional supplements]; Iohexol; Latex; Peach [prunus persica]; Peanut-containing drug products; Penicillins; Strawberry extract; Sulfonamide derivatives; Wheat; Wheat bran; Sulfamethoxazole; Crestor [rosuvastatin calcium]; and Iodine   Past Medical History:  Diagnosis Date  . Allergic rhinitis   . Anxiety   . Emphysema of lung ([place])   . HLD (hyperlipidemia)   . HTN (hypertension)   . Intolerance of drug    orthostatic  . Paroxysmal supraventricular tachycardia ([place])   . PVD (peripheral vascular disease) ([place])   . Seizure ([place])   . Syncope and collapse   . Uterine prolapse without mention of vaginal wall prolapse     Past Surgical History:  Procedure Laterality Date  . CARDIOVERSION N/A 11/05/2014   Procedure: CARDIOVERSION;  Surgeon: Candee Furbish, MD;  Location[facility] ENDOSCOPY;  Service: Cardiovascular;  Laterality: N/A;  . cataract surgery  08/2015  . CHOLECYSTECTOMY    . corrective eye surgery     as a child  .  INTRAMEDULLARY (IM) NAIL INTERTROCHANTERIC Left 11/21/2016   Procedure: INTRAMEDULLARY (IM) NAIL INTERTROCHANTRIC HEMI;  Surgeon: Newt Minion, MD;  Location: [place];  Service: Orthopedics;  Laterality: Left;  . IVD removed    . OPEN REDUCTION INTERNAL FIXATION (ORIF) DISTAL RADIAL FRACTURE Right 08/29/2014   Procedure: OPEN REDUCTION INTERNAL FIXATION (ORIF) DISTAL RADIAL FRACTURE;  Surgeon: Marianna Payment, MD;  Location: [place];  Service: Orthopedics;  Laterality: Right;  . RF ablation PSVT     summer '10  . stress cardiolite  08/05/93  . TONSILLECTOMY    . TOTAL HIP ARTHROPLASTY Right 08/29/2014   Procedure: Right Hip Hemi Arthroplasty;  Surgeon: Marianna Payment, MD;  Location: [place];  Service: Orthopedics;  Laterality: Right;  Hip procedure 1st wants [employer], [employer], [place].      Social History:  The patient   reports that she quit smoking about 23 years ago. She started smoking about 65 years ago. She has a 9.50 pack-year smoking history. She has never used smokeless tobacco. She reports that she does not drink alcohol or use drugs.   Family History:  The patient's family history includes Arthritis in her father; Heart disease in her brother; Hyperlipidemia in her sister; Hypertension in her brother, sister, and unknown relative; Mental illness in her father.    ROS:  Please see the history of present illness. All other systems are reviewed and  Negative to the above problem except as noted.    PHYSICAL EXAM: VS:  BP (!) 120/48   Pulse 60   Ht 5\' 7"  (1.702 m)   Wt 108 lb 12.8 oz (49.4 kg)   SpO2 98%   BMI 17.04 kg/m   GEN: Thin 83  Y in no acute distress  HEENT: normal  Neck: JVP normal  , carotid bruits, or masses Cardiac:  Regular rate/rhythm  ; no murmurs, rubs, or gallops,no edema  Respiratory:  clear to auscultation bilaterally, normal work of breathing GI: soft, nontender, nondistended, + BS  No hepatomegaly  MS: no deformity Moving all extremities   Skin: warm and dry, no rash Neuro:  Strength and sensation are intact Psych: euthymic mood, full affect   EKG:  EKG is not ordered today.   Lipid Panel    Component Value Date/Time   CHOL 132 10/30/2015 0931   TRIG 71 10/30/2015 0931   TRIG 141 10/25/2006 0906   HDL 33 (L) 10/30/2015 0931   CHOLHDL 4.0 10/30/2015 0931   VLDL 14 10/30/2015 0931   LDLCALC 85 10/30/2015 0931   LDLDIRECT 175.3 01/19/2008 1216      Wt Readings from Last 3 Encounters:  07/21/17 108 lb 12.8 oz (49.4 kg)  07/04/17 108 lb (49 kg)  06/21/17 107 lb 5 oz (48.7 kg)      ASSESSMENT AND PLAN:  1  Persistent atrial fib  Rate is fairly regular  EKG not done  I would continue on Eliquis    2  HTN   BP is OK  Keep on same meds  3  Renal  Pt thinks she has a UTI  Will set upf for UA and Urine culture    4  Memory  The pt appears scatttered  A  little confused  She has been like this in past    Will need to follow  I think she is taking meds     F?U in 6 months    Current medicines are reviewed at length with the patient  today.  The patient does not have concerns regarding medicines.  Signed, Dorris Carnes, MD  07/21/2017 3:11 PM    [facility] [place], [place], [place]  [id number] Phone: [phone]; Fax: [phone]

## 2017-07-21 ENCOUNTER — Encounter: Payer: Self-pay | Admitting: Internal Medicine

## 2017-07-21 ENCOUNTER — Ambulatory Visit (INDEPENDENT_AMBULATORY_CARE_PROVIDER_SITE_OTHER): Payer: Medicare Other | Admitting: Internal Medicine

## 2017-07-21 VITALS — BP 120/48 | HR 60 | Ht 67.0 in | Wt 108.8 lb

## 2017-07-21 DIAGNOSIS — I10 Essential (primary) hypertension: Secondary | ICD-10-CM

## 2017-07-21 DIAGNOSIS — Z23 Encounter for immunization: Secondary | ICD-10-CM | POA: Diagnosis not present

## 2017-07-21 DIAGNOSIS — R300 Dysuria: Secondary | ICD-10-CM | POA: Diagnosis not present

## 2017-07-21 DIAGNOSIS — N390 Urinary tract infection, site not specified: Secondary | ICD-10-CM

## 2017-07-21 DIAGNOSIS — I4891 Unspecified atrial fibrillation: Secondary | ICD-10-CM | POA: Diagnosis not present

## 2017-07-21 NOTE — Patient Instructions (Signed)
Your physician recommends that you continue on your current medications as directed. Please refer to the Current Medication list given to you today.  Your physician wants you to follow-up in: 6 MONTHS WITH DR. ROSS.   You will receive a reminder letter in the mail two months in advance. If you don't receive a letter, please call our office to schedule the follow-up appointment.  

## 2017-07-22 ENCOUNTER — Ambulatory Visit
Admission: RE | Admit: 2017-07-22 | Discharge: 2017-07-22 | Disposition: A | Discharge status: Home / Self Care | Payer: Medicare Other | Source: Ambulatory Visit | Attending: Internal Medicine | Admitting: Internal Medicine

## 2017-07-22 ENCOUNTER — Telehealth: Payer: Self-pay | Admitting: Internal Medicine

## 2017-07-22 ENCOUNTER — Other Ambulatory Visit: Payer: Self-pay | Admitting: Internal Medicine

## 2017-07-22 ENCOUNTER — Encounter: Payer: Self-pay | Admitting: Internal Medicine

## 2017-07-22 ENCOUNTER — Telehealth: Payer: Self-pay

## 2017-07-22 DIAGNOSIS — N390 Urinary tract infection, site not specified: Secondary | ICD-10-CM | POA: Diagnosis not present

## 2017-07-22 DIAGNOSIS — R300 Dysuria: Secondary | ICD-10-CM | POA: Diagnosis not present

## 2017-07-22 DIAGNOSIS — N183 Chronic kidney disease, stage 3 unspecified: Secondary | ICD-10-CM | POA: Insufficient documentation

## 2017-07-22 DIAGNOSIS — E041 Nontoxic single thyroid nodule: Secondary | ICD-10-CM

## 2017-07-22 LAB — URINALYSIS
Bilirubin, UA: NEGATIVE
Glucose, UA: NEGATIVE
Ketones, UA: NEGATIVE
LEUKOCYTES UA: NEGATIVE
NITRITE UA: NEGATIVE
PH UA: 5.5 (ref 5.0–7.5)
PROTEIN UA: NEGATIVE
RBC UA: NEGATIVE
SPEC GRAV UA: 1.013 (ref 1.005–1.030)
Urobilinogen, Ur: 0.2 mg/dL (ref 0.2–1.0)

## 2017-07-22 NOTE — Telephone Encounter (Signed)
none

## 2017-07-22 NOTE — Telephone Encounter (Signed)
-----   Message from Biagio Borg, MD sent at 07/22/2017  3:51 PM EDT ----- Left message on MyChart, pt to cont same tx except  The test results show that your current treatment is OK, except the thyroid does have some enlargement on the right, which seems to indicate the need for a biopsy.  We will send the order for the Ultrasound Guided thyroid biopsy, and you should hear soon.Kayla Kent to please inform pt, I will do order

## 2017-07-22 NOTE — Telephone Encounter (Signed)
Pt and her daughter Kayla Kent has been informed of the results. They both expressed understanding.

## 2017-07-24 LAB — URINE CULTURE

## 2017-07-25 ENCOUNTER — Ambulatory Visit: Payer: Medicare Other | Admitting: Internal Medicine

## 2017-07-25 ENCOUNTER — Telehealth: Payer: Self-pay | Admitting: *Deleted

## 2017-07-25 NOTE — Telephone Encounter (Signed)
Called to discuss [place] outreach with patient. During conversation patient stated she continues to have dysuria. Urine Culture from 07/22/17 show no significant growth. Scheduled an appointment with PCP 07/27/17 to f/u regarding dysuria. Patient shared that she has been in contact with [place] from [facility] and states she is agreeable to continue further contact from [place].

## 2017-07-26 ENCOUNTER — Telehealth: Payer: Self-pay | Admitting: *Deleted

## 2017-07-26 ENCOUNTER — Telehealth: Payer: Self-pay | Admitting: Internal Medicine

## 2017-07-26 NOTE — Addendum Note (Signed)
Addended by: Biagio Borg on: 07/26/2017 11:44 AM   Modules accepted: Orders

## 2017-07-26 NOTE — Telephone Encounter (Signed)
Pt takes Eliquis for afib with CHADS2VASc score of 8 (age, sex, HTN, CHF, TIA, CAD). Renal function is normal. Given elevated cardiac risk, recommend only holding Eliquis for 1 day prior to procedure. Clearance faxed to below number.

## 2017-07-26 NOTE — Telephone Encounter (Signed)
Talked to patient's daughter [name] [facility]). Pt has appointment tomorrow with PCP re: urinary symptoms. Pt was still on antibiotic (CIPRO) when most recent UA was completed. Dtr offered that pt will be needing a thyroid biopsy in the near future.  [facility] will be asking about stopping Eliquis .  Dtr would like notified if we advise to hold medication so that she can help pt remember the instructions.

## 2017-07-26 NOTE — Telephone Encounter (Signed)
Received a call from [place] at [facility] and she states they need additional referral for the biopsy, since there are 2 nodules.  Each nodule requires they're own order

## 2017-07-26 NOTE — Telephone Encounter (Signed)
New message          [facility] Pre-operative Risk Assessment    Request for surgical clearance:  What type of surgery is being performed?  Thyroid biopsy 1. When is this surgery scheduled?  Pending clearance  Are there any medications that need to be held prior to surgery and how long? Hold eliquis prior to appt 2. Practice name and name of physician performing surgery? Radiologist on call 3. What is your office phone and fax number? Fax [phone]  4. Anesthesia type (None, local, MAC, general) ?  lidocaine   Kayla Kent 07/26/2017, 2:21 PM  _________________________________________________________________   (provider comments below)

## 2017-07-26 NOTE — Telephone Encounter (Signed)
Ok, second order is done

## 2017-07-26 NOTE — Telephone Encounter (Signed)
-----   Message from Fay Records, MD sent at 07/25/2017  8:00 AM EDT ----- Urine shows no evid for UTI

## 2017-07-27 ENCOUNTER — Telehealth: Payer: Self-pay | Admitting: Internal Medicine

## 2017-07-27 ENCOUNTER — Ambulatory Visit (INDEPENDENT_AMBULATORY_CARE_PROVIDER_SITE_OTHER): Payer: Medicare Other | Admitting: Internal Medicine

## 2017-07-27 ENCOUNTER — Encounter: Payer: Self-pay | Admitting: Internal Medicine

## 2017-07-27 VITALS — BP 128/72 | HR 72 | Temp 97.8°F | Ht 67.0 in | Wt 105.0 lb

## 2017-07-27 DIAGNOSIS — I10 Essential (primary) hypertension: Secondary | ICD-10-CM | POA: Diagnosis not present

## 2017-07-27 DIAGNOSIS — N183 Chronic kidney disease, stage 3 unspecified: Secondary | ICD-10-CM

## 2017-07-27 DIAGNOSIS — R350 Frequency of micturition: Secondary | ICD-10-CM | POA: Diagnosis not present

## 2017-07-27 MED ORDER — FESOTERODINE FUMARATE ER 4 MG PO TB24: 4.0000 mg | ORAL_TABLET | Freq: Every day | ORAL | 3 refills | Status: DC

## 2017-07-27 NOTE — Telephone Encounter (Signed)
Pt has an appt today with Dr. Jenny Reichmann to follow up on her UTI. She also may ask about continuing her hip injections as she had some relief she associated with the injection Baldo Ash gave her recently.  Also, her thyroid biopsy is pending clearance from her cardiologist ( pt on Eloquis ). Could you communicate this to her today so that she is aware it is being working on. Thanks

## 2017-07-27 NOTE — Assessment & Plan Note (Signed)
Urine cx neg, likely OAB with frequency related mild discomfort, for trial toviaz daily asd, consider GYN if not improved

## 2017-07-27 NOTE — Patient Instructions (Addendum)
Please take all new medication as prescribed - the toviaz  Please continue all other medications as before, and refills have been done if requested.  Please have the pharmacy call with any other refills you may need.  Please keep your appointments with your specialists as you may have planned  Please return in 6 months, or sooner if needed

## 2017-07-27 NOTE — Progress Notes (Signed)
Subjective:    Patient ID: Kayla Kent, female    DOB: 26-May-1932, 83 y.o.   MRN: [id number]  HPI  Here to f/u with daughter present, reportedly for "dysuria" but today she states she has primarily urinary frequency numerous times per day assoc with urgency, rare wetting accidents and slight discomfort on urination.  Denies urinary symptoms such as abd pain, flank pain, hematuria or n/v, fever, chills.  Helpfully, we also have urine cx results from oct 5 negative for significant infection.  Pt denies chest pain, increased sob or doe, wheezing, orthopnea, PND, increased LE swelling, palpitations, dizziness or syncope.  Left lateral hip pain improved with steroid injection per NP.  Denies pelvic pain or vaginitis symptoms Past Medical History:  Diagnosis Date  . Allergic rhinitis   . Anxiety   . CKD (chronic kidney disease) stage 3, GFR 30-59 ml/min (HCC)   . Emphysema of lung ([place])   . HLD (hyperlipidemia)   . HTN (hypertension)   . Intolerance of drug    orthostatic  . Paroxysmal supraventricular tachycardia ([place])   . PVD (peripheral vascular disease) ([place])   . Seizure ([place])   . Syncope and collapse   . Uterine prolapse without mention of vaginal wall prolapse    Past Surgical History:  Procedure Laterality Date  . CARDIOVERSION N/A 11/05/2014   Procedure: CARDIOVERSION;  Surgeon: Candee Furbish, MD;  Location: [facility] ENDOSCOPY;  Service: Cardiovascular;  Laterality: N/A;  . cataract surgery  08/2015  . CHOLECYSTECTOMY    . corrective eye surgery     as a child  . INTRAMEDULLARY (IM) NAIL INTERTROCHANTERIC Left 11/21/2016   Procedure: INTRAMEDULLARY (IM) NAIL INTERTROCHANTRIC HEMI;  Surgeon: Newt Minion, MD;  Location: [place];  Service: Orthopedics;  Laterality: Left;  . IVD removed    . OPEN REDUCTION INTERNAL FIXATION (ORIF) DISTAL RADIAL FRACTURE Right 08/29/2014   Procedure: OPEN REDUCTION INTERNAL FIXATION (ORIF) DISTAL RADIAL FRACTURE;  Surgeon: Marianna Payment, MD;  Location:  [place];  Service: Orthopedics;  Laterality: Right;  . RF ablation PSVT     summer '10  . stress cardiolite  08/05/93  . TONSILLECTOMY    . TOTAL HIP ARTHROPLASTY Right 08/29/2014   Procedure: Right Hip Hemi Arthroplasty;  Surgeon: Marianna Payment, MD;  Location: [place];  Service: Orthopedics;  Laterality: Right;  Hip procedure 1st wants [employer], [employer], [place].     reports that she quit smoking about 23 years ago. She started smoking about 65 years ago. She has a 9.50 pack-year smoking history. She has never used smokeless tobacco. She reports that she does not drink alcohol or use drugs. family history includes Arthritis in her father; Heart disease in her brother; Hyperlipidemia in her sister; Hypertension in her brother, sister, and unknown relative; Mental illness in her father. Allergies  Allergen Reactions  . Chocolate Hives  . Codeine Other (See Comments)    Patient states she acts crazy  . Diazepam Other (See Comments)    Patient states she acts crazy.  . Fruit & Vegetable Daily [Nutritional Supplements] Hives and Swelling    peaches  . Iohexol Hives     Code: HIVES, Desc: pt gets 13 hr pre-meds, Onset Date: [id number]   . Latex Hives  . Peach [Prunus Persica] Hives  . Peanut-Containing Drug Products Hives and Swelling  . Penicillins Hives, Itching and Swelling    Has patient had a PCN reaction causing immediate rash, facial/tongue/throat swelling, SOB or lightheadedness  with hypotension: Yes Has patient had a PCN reaction causing severe rash involving mucus membranes or skin necrosis: No Has patient had a PCN reaction that required hospitalization No Has patient had a PCN reaction occurring within the last 10 years: No If all of the above answers are "NO", then may proceed with Cephalosporin use.   . Strawberry Extract Hives  . Sulfonamide Derivatives Hives    nausea  . Wheat Shortness Of Breath    Shortness of breath  . Wheat Bran Shortness Of Breath  .  Sulfamethoxazole Hives  . Crestor [Rosuvastatin Calcium] Rash  . Iodine Rash   Current Outpatient Prescriptions on File Prior to Visit  Medication Sig Dispense Refill  . ALPRAZolam (XANAX) 0.25 MG tablet TAKE 1 TABLET BY MOUTH TWICE DAILY AS NEEDED 60 tablet 3  . amLODipine (NORVASC) 2.5 MG tablet TAKE ONE TABLET BY MOUTH ONCE DAILY 90 tablet 3  . atorvastatin (LIPITOR) 10 MG tablet TAKE ONE TABLET BY MOUTH ONCE DAILY AT 6 PM 90 tablet 3  . ciprofloxacin (CIPRO) 250 MG tablet Take 1 tablet (250 mg total) by mouth 2 (two) times daily. 10 tablet 0  . DILT-XR 180 MG 24 hr capsule TAKE 1 CAPSULE BY MOUTH ONCE DAILY 90 capsule 2  . ELIQUIS 2.5 MG TABS tablet TAKE ONE TABLET BY MOUTH TWICE DAILY 180 tablet 3  . ibuprofen (ADVIL,MOTRIN) 600 MG tablet Take 1 tablet (600 mg total) by mouth every 6 (six) hours as needed. 30 tablet 0  . levETIRAcetam (KEPPRA) 250 MG tablet Take 1 tablet (250 mg total) by mouth 2 (two) times daily. 120 tablet 5  . methocarbamol (ROBAXIN) 500 MG tablet Take 1 tablet (500 mg total) by mouth at bedtime as needed for muscle spasms. 30 tablet 1  . pantoprazole (PROTONIX) 40 MG tablet Take 1 tablet (40 mg total) by mouth daily. 90 tablet 1  . sodium chloride (OCEAN) 0.65 % SOLN nasal spray Place 1 spray into both nostrils as needed for congestion.    . traMADol (ULTRAM) 50 MG tablet Take 1-2 tablets (50-100 mg total) by mouth every 6 (six) hours as needed for moderate pain or severe pain. 10 tablet 0  . traZODone (DESYREL) 50 MG tablet Take 50 mg by mouth at bedtime.      No current facility-administered medications on file prior to visit.    Review of Systems  Constitutional: Negative for other unusual diaphoresis or sweats HENT: Negative for ear discharge or swelling Eyes: Negative for other worsening visual disturbances Respiratory: Negative for stridor or other swelling  Gastrointestinal: Negative for worsening distension or other blood Genitourinary: Negative for  retention or other urinary change Musculoskeletal: Negative for other MSK pain or swelling Skin: Negative for color change or other new lesions Neurological: Negative for worsening tremors and other numbness  Psychiatric/Behavioral: Negative for worsening agitation or other fatigue All other system neg per pt    Objective:   Physical Exam BP 128/72   Pulse 72   Temp 97.8 F (36.6 C) (Oral)   Ht 5\' 7"  (1.702 m)   Wt 105 lb (47.6 kg)   SpO2 98%   BMI 16.45 kg/m  VS noted,  Constitutional: Pt appears in NAD HENT: Head: NCAT.  Right Ear: External ear normal.  Left Ear: External ear normal.  Eyes: . Pupils are equal, round, and reactive to light. Conjunctivae and EOM are normal Nose: without d/c or deformity Neck: Neck supple. Gross normal ROM Cardiovascular: Normal rate and regular rhythm.  Pulmonary/Chest: Effort normal and breath sounds without rales or wheezing.  Abd:  Soft, NT, ND, + BS, no organomegaly Neurological: Pt is alert. At baseline orientation, motor grossly intact Skin: Skin is warm. No rashes, other new lesions, no LE edema Psychiatric: Pt behavior is normal without agitation  No other exam findings    Assessment & Plan:

## 2017-07-27 NOTE — Assessment & Plan Note (Signed)
Lab Results  Component Value Date   CREATININE 1.07 (H) 04/15/2017  stable overall by history and exam, recent data reviewed with pt, and pt to continue medical treatment as before,  to f/u any worsening symptoms or concerns

## 2017-07-27 NOTE — Assessment & Plan Note (Signed)
stable overall by history and exam, recent data reviewed with pt, and pt to continue medical treatment as before,  to f/u any worsening symptoms or concerns BP Readings from Last 3 Encounters:  07/27/17 128/72  07/21/17 (!) 120/48  07/04/17 (!) 154/62

## 2017-07-28 ENCOUNTER — Telehealth: Payer: Self-pay | Admitting: Internal Medicine

## 2017-07-28 MED ORDER — OXYBUTYNIN CHLORIDE ER 10 MG PO TB24: 10.0000 mg | ORAL_TABLET | Freq: Every day | ORAL | 3 refills | Status: DC

## 2017-07-28 NOTE — Telephone Encounter (Signed)
Daughter states that patient was prescribed toviaz.  States patient can not afford this b/c she is in the doughnut hole.  States pharmacy gave her the name of two other meds:  Flavoxate  hcl 100 mg and Oxybutynin 10mg  extended release.  Would like the provider to send in script for one of the two.  Patient uses Paediatric nurse on [place].

## 2017-07-28 NOTE — Telephone Encounter (Signed)
Notified [name] w/MD recommendation...Johny Chess

## 2017-07-28 NOTE — Telephone Encounter (Signed)
Ok for oxybutinin ER 10 qd - done erx

## 2017-07-29 ENCOUNTER — Other Ambulatory Visit: Payer: Self-pay

## 2017-07-29 NOTE — Patient Outreach (Signed)
[facility]) Care Management  07/29/17  KIERSTON PLASENCIA [birth year] [id number]  RNCM received a new referral for patient on 07/22/2017 from patient's PCP wanting [facility] to continue to follow up with patient. However, patient has not been responding to [facility] calls and letter sent to her home.  RNCM spoke with Emelia Loron, RN at Dr. Gwynn Burly office regarding patient's daughter having concerns and would like to have RNCM continue with outreach. RNCM agreeable to making an additional outreach attempt to try to reconnect with patient but will not be able to keep her open if patient is not responsive to care.   Patient has been alert and oriented and relatively independent.   RNCM attempted to reach patient today without success. Phone rang until a message came up requesting an access code. No option to leave a message.  Plan: RNCM will make 1 additional outreach attempt next week and if no response will continue with process of case closure.  [place] R. Evangelynn Lochridge, RN, BSN, [place] Management Coordinator [phone]

## 2017-08-01 ENCOUNTER — Other Ambulatory Visit: Payer: Self-pay | Admitting: Nurse Practitioner

## 2017-08-01 ENCOUNTER — Other Ambulatory Visit: Payer: Self-pay

## 2017-08-01 DIAGNOSIS — N3000 Acute cystitis without hematuria: Secondary | ICD-10-CM

## 2017-08-01 DIAGNOSIS — R300 Dysuria: Secondary | ICD-10-CM

## 2017-08-01 NOTE — Patient Outreach (Signed)
[facility]) Care Management  08/01/17  Kayla Kent 19-Dec-1931 [id number]  Successful outreach completed with patient. Patient identification verified.  Patient stated that her exercise class went well today. She currently has no complaints. RNCM explained that per review of patient's medical record, her cardiologist had sent approval to hold Eliquis for 1 day so that she can have the thyroid biopsy. Patient stated that she had just got off the phone with Kayla Kent at [facility] and had scheduled her biopsy. She stated that she is to "not take her Eliquis the day before, the day of and the day after her biopsy"  (Tuesday, 10/23, hold it on Wednesday 10/24 and she has to be there for her appointment at 3:25 pm, and that she is to hold her Eliquis again on Thursday 10/25). She provided Kayla Kent with contact information for Kayla Kent [phone]) so that RNCM could clarify directions.   Patient stated that she thought she already had this scheduled for November 29, and stated that her daughter was upset with her this morning because she had not already called to schedule. She stated that they "cannot wait until November 29." Patient stated that she is "so glad that she finally has this scheduled" and that Kayla Kent would be calling her daughter for her to let her know of the appointment at her request.   However, RNCM was concerned that patient seemed to be getting appointments confused because patient does have an appointment scheduled on 09/15/2017 with Dr. Ashok Cordia for follow up, but not for the biopsy. RNCM reviewed upcoming appointments with patient and she was not aware of why she was seeing Dr. Ashok Cordia or that she had an appointment on 10/29 with the cancer [facility] for a follow up. RNCM offered to provide patient with a Kayla Kent calendar and will write all upcoming appointments that are scheduled in the calendar for her. Patient was appreciative and asked if RNCM could mail the calendar.  RNCM offered to personally deliver it to patient and meet with her to introduce RNCM and patient was agreeable.   Patient has no other questions or concerns at present.  Plan: RNCM to follow up with [facility] to clarify patient instructions about holding Eliquis and will document instructions on calendar for patient.   Home visit scheduled for this week to deliver [place] and review appointments, instructions for holding Eliquis prior to biopsy and address any additional concerns/needs patient may have and patient is agreeable with plan.  Kayla R. Keddrick Wyne, RN, BSN, [place] Management Coordinator [phone]

## 2017-08-01 NOTE — Patient Outreach (Signed)
[facility]) Care Management  08/01/17  Kayla Kent September 14, 1932 [id number]  Successful outreach completed with Kayla Kent at [place]. Received clarification that patient is to stop taking her Eliquis on Tuesday 10/23 and not take it on the day of the biopsy (10/24). She may resume taking it on Thursday 10/25. RNCM thanked Kayla Kent for assistance in clarification and will document instructions on 10/23 and 10/24 to not take Eliquis and on 10/25 to resume taking Eliquis.  RNCM will review directions with patient at home visit this week.  [place] R. Maleki Hippe, RN, BSN, [place] Management Coordinator [phone]

## 2017-08-01 NOTE — Patient Outreach (Signed)
[name]) Care Management  08/01/17  Kayla Kent 1932/06/16 [id number]  Successful outreach completed with patient. Patient identification verified. RNCM advised have been having difficulty reaching patient and Emelia Loron, RN with Dr. Gwynn Burly office had requested that [facility] continue to follow.   Per [name], she stated that she is fine and does not have any case management needs. She stated that has down that she has an appointment with Dr. Tamala Julian on Friday 08/12/2017 at 10:45 am about her thyroid. She stated that her daughter then called and asked if she had scheduled an appointment about her thyroid.She voiced that she was really upset with her daughter about an upcoming appointment and requested that RNCM not discuss her daughter because she is angry.  She also stated that she had received a message from Dr. Harrington Challenger requesting a call back to make an appointment. She stated that she is on Eliquis and needs permission to stop it for 2 days prior to the procedure, but she does not know when her procedure is or if she has to have an appointment with Dr. Harrington Challenger before having it.  Patient stated that she was getting ready to walk out the door to go to her exercise class and requested RNCM call back in a few hours. RNCM was agreeable and will look into some of her concerns prior to calling her back.   Plan: RNCM to research appointments scheduled/needs for appointments and will call patient back in a few hours.  [place] R. Autymn Omlor, RN, BSN, [place] Management Coordinator [phone]

## 2017-08-01 NOTE — Telephone Encounter (Signed)
Please advise, Dr. Jenny Reichmann saw her on 07/27/2017 for this problem.

## 2017-08-01 NOTE — Telephone Encounter (Signed)
I would not refill the cipro, as the last urine cx was negative on Oct 5  I have placed orders for repeat urine testing if she likes, but treatment would have to depend on results  There are other reasons for discomfort - I would suggest f/u with GYN if urine studies negative and toviaz type prescription does not work

## 2017-08-01 NOTE — Telephone Encounter (Signed)
This encounter was created in error - please disregard.

## 2017-08-02 NOTE — Telephone Encounter (Signed)
Spoke with patient, she stated that she is still having some UTI issues and discomfort. She has been informed of the urine labs that have been entered and stated the earliest she could come would be Thursday since she is having her biopsy on Wednesday.

## 2017-08-03 ENCOUNTER — Ambulatory Visit: Payer: Self-pay

## 2017-08-10 ENCOUNTER — Ambulatory Visit
Admission: RE | Admit: 2017-08-10 | Discharge: 2017-08-10 | Disposition: A | Discharge status: Home / Self Care | Payer: Medicare Other | Source: Ambulatory Visit | Attending: Internal Medicine | Admitting: Internal Medicine

## 2017-08-10 ENCOUNTER — Other Ambulatory Visit (HOSPITAL_COMMUNITY)
Admission: RE | Admit: 2017-08-10 | Discharge: 2017-08-10 | Disposition: A | Discharge status: Home / Self Care | Payer: Medicare Other | Source: Ambulatory Visit | Attending: Radiology | Admitting: Radiology

## 2017-08-10 ENCOUNTER — Telehealth: Payer: Self-pay | Admitting: Internal Medicine

## 2017-08-10 DIAGNOSIS — E049 Nontoxic goiter, unspecified: Secondary | ICD-10-CM | POA: Diagnosis not present

## 2017-08-10 DIAGNOSIS — E041 Nontoxic single thyroid nodule: Secondary | ICD-10-CM | POA: Insufficient documentation

## 2017-08-10 DIAGNOSIS — E079 Disorder of thyroid, unspecified: Secondary | ICD-10-CM | POA: Diagnosis not present

## 2017-08-10 DIAGNOSIS — E042 Nontoxic multinodular goiter: Secondary | ICD-10-CM | POA: Diagnosis not present

## 2017-08-10 NOTE — Telephone Encounter (Signed)
New message    Patient daughter calling, states mother has leg numbness,maybe because she did not take Eliquis on 10/23  Pt c/o medication issue:  1. Name of Medication: ELIQUIS 2.5 MG TABS tablet 2. How are you currently taking this medication (dosage and times per day)? As prescribed  3. Are you having a reaction (difficulty breathing--STAT)? no  4. What is your medication issue? Patient has been of med since 10/23, having leg numbness.

## 2017-08-10 NOTE — Telephone Encounter (Signed)
Pt states woke this 4 am had pain both legs-knees to heels.  Ambulated to BR with walker (uses at night).  Called her daughter who came over later in the morning. By then pt was up and dressed and walking around fine without any assist.  Feels good now.  No concerns.   Thyroid biopsy was today.  Eliquis was held yesterday.  Restarting tomorrow am.

## 2017-08-12 ENCOUNTER — Ambulatory Visit: Payer: Medicare Other | Admitting: Family Medicine

## 2017-08-12 ENCOUNTER — Encounter: Payer: Self-pay | Admitting: Internal Medicine

## 2017-08-15 ENCOUNTER — Ambulatory Visit: Admission: RE | Admit: 2017-08-15 | Payer: Medicare Other | Source: Ambulatory Visit | Admitting: Radiation Oncology

## 2017-08-16 ENCOUNTER — Telehealth: Payer: Self-pay | Admitting: *Deleted

## 2017-08-16 NOTE — Telephone Encounter (Signed)
CALLED PATIENT TO RESCHEDULE FU FROM 08-15-17, RESCHEDULED FOR 08-23-17 @ 10:30 AM, LVM FOR A RETURN CALL

## 2017-08-17 ENCOUNTER — Other Ambulatory Visit: Payer: Self-pay | Admitting: Nurse Practitioner

## 2017-08-17 ENCOUNTER — Telehealth: Payer: Self-pay | Admitting: Radiation Oncology

## 2017-08-17 ENCOUNTER — Telehealth: Payer: Self-pay | Admitting: Internal Medicine

## 2017-08-17 DIAGNOSIS — N3000 Acute cystitis without hematuria: Secondary | ICD-10-CM

## 2017-08-17 DIAGNOSIS — C3411 Malignant neoplasm of upper lobe, right bronchus or lung: Secondary | ICD-10-CM

## 2017-08-17 NOTE — Telephone Encounter (Signed)
Sorry, I was out of the office last wk, but I did send a letter about the results on Mon oct 29, should get soon  Thyroid biopsy was NEGATIVE to both areas that were biopsied; no further tx is needed

## 2017-08-17 NOTE — Telephone Encounter (Signed)
I called the patient to let her know that she missed her appointment. She is feeling great and did not notice side effects.  I discussed that we should move forward with her first posttreatment scan which I will order, and then every 6 months thereafter continue to follow her.  She states agreement and understanding.  I also called and left a message for her daughter explaining this on her message machine.

## 2017-08-17 NOTE — Telephone Encounter (Signed)
Patient's daughter called waiting to know if we had any results from the scan she had done last week. Please follow up. Thank you.

## 2017-08-18 NOTE — Telephone Encounter (Signed)
Pt's daughter has been informed and expressed understanding.

## 2017-08-19 ENCOUNTER — Telehealth: Payer: Self-pay | Admitting: *Deleted

## 2017-08-19 NOTE — Progress Notes (Signed)
  Radiation Oncology         [phone]) [phone] ________________________________  Name: Kayla Kent MRN: [id number]  Date: 06/28/2017  DOB: Aug 07, 1932  RESPIRATORY MOTION MANAGEMENT SIMULATION  NARRATIVE:  In order to account for effect of respiratory motion on target structures and other organs in the planning and delivery of radiotherapy, this patient underwent respiratory motion management simulation.  To accomplish this, when the patient was brought to the CT simulation planning suite, 4D respiratoy motion management CT images were obtained.  The CT images were loaded into the planning software.  Then, using a variety of tools including Cine, MIP, and standard views, the target volume and planning target volumes (PTV) were delineated.  Avoidance structures were contoured.  Treatment planning then occurred.  Dose volume histograms were generated and reviewed for each of the requested structure.  The resulting plan was carefully reviewed and approved today.  ------------------------------------------------  Jodelle Gross, MD, PhD

## 2017-08-19 NOTE — Addendum Note (Signed)
Encounter addended by: Kyung Rudd, MD on: 08/19/2017 11:37 AM<BR>    Actions taken: Sign clinical note

## 2017-08-19 NOTE — Telephone Encounter (Signed)
CALLED PATIENT TO INFORM OF STAT LABS FOR 08-23-17 @ 2:45 PM @ [facility] AND HER CT ON 08-23-17 @ 3:45 PM @ [facility], NO RESTRICTIONS TO TEST, LVM FOR A RETURN CALL

## 2017-08-23 ENCOUNTER — Ambulatory Visit
Admission: RE | Admit: 2017-08-23 | Discharge: 2017-08-23 | Disposition: A | Discharge status: Home / Self Care | Payer: Medicare Other | Source: Ambulatory Visit | Attending: Radiation Oncology | Admitting: Radiation Oncology

## 2017-08-23 ENCOUNTER — Other Ambulatory Visit: Payer: Self-pay | Admitting: *Deleted

## 2017-08-23 ENCOUNTER — Ambulatory Visit: Payer: Self-pay | Admitting: Radiation Oncology

## 2017-08-23 ENCOUNTER — Ambulatory Visit (HOSPITAL_COMMUNITY)
Admission: RE | Admit: 2017-08-23 | Discharge: 2017-08-23 | Disposition: A | Discharge status: Home / Self Care | Payer: Medicare Other | Source: Ambulatory Visit | Attending: Radiation Oncology | Admitting: Radiation Oncology

## 2017-08-23 ENCOUNTER — Encounter (HOSPITAL_COMMUNITY): Payer: Self-pay

## 2017-08-23 DIAGNOSIS — K228 Other specified diseases of esophagus: Secondary | ICD-10-CM | POA: Diagnosis not present

## 2017-08-23 DIAGNOSIS — I2510 Atherosclerotic heart disease of native coronary artery without angina pectoris: Secondary | ICD-10-CM | POA: Insufficient documentation

## 2017-08-23 DIAGNOSIS — J432 Centrilobular emphysema: Secondary | ICD-10-CM | POA: Diagnosis not present

## 2017-08-23 DIAGNOSIS — I700 Atherosclerosis of aorta: Secondary | ICD-10-CM | POA: Insufficient documentation

## 2017-08-23 DIAGNOSIS — C3411 Malignant neoplasm of upper lobe, right bronchus or lung: Secondary | ICD-10-CM | POA: Diagnosis not present

## 2017-08-23 DIAGNOSIS — R918 Other nonspecific abnormal finding of lung field: Secondary | ICD-10-CM | POA: Diagnosis not present

## 2017-08-23 DIAGNOSIS — J439 Emphysema, unspecified: Secondary | ICD-10-CM | POA: Diagnosis not present

## 2017-08-23 LAB — BUN AND CREATININE (CC13)
BUN: 19.8 mg/dL (ref 7.0–26.0)
Creatinine: 1.3 mg/dL — ABNORMAL HIGH (ref 0.6–1.1)
EGFR: 40 mL/min/{1.73_m2} — ABNORMAL LOW (ref >60)

## 2017-08-24 ENCOUNTER — Telehealth: Payer: Self-pay | Admitting: Radiation Oncology

## 2017-08-24 NOTE — Telephone Encounter (Signed)
I called the patient to review her CT imaging but had to leave a message for her to call me back.

## 2017-08-29 ENCOUNTER — Other Ambulatory Visit: Payer: Self-pay

## 2017-08-29 NOTE — Patient Outreach (Addendum)
[facility]) Care Management  08/29/17  Kayla Kent [birth year] [id number]  Successful outreach completed with patient. Patient identification verified.   Patient stated that she is doing well and has no concerns or needs from [name] at present. She stated that everything has been going great except.  Patient stated that she has not heard back from her biopsy of her thyroid and she also had a CT scan done last week and has not heard anything. RNCM advised patient that Worthy Flank with the cancer center had called on 11/7 to leave her a voicemail and requested she call back to discuss her CT results. Encouraged her to give her a call as she has tried to reach out to her. Patient stated that no one has called her because she is always at home, and no one has left her a message.  Also advised that it appears that her thyroid biopsy results were mailed to her at home. Patient stated she had received a letter from Dr. Jenny Reichmann, but her results were not included. RNCM explained that per the letter that was mailed to her home, her results were negative at both sites that were biopsied. Encouraged her to follow up with Dr. Jenny Reichmann if she has any questions or concerns regarding this information.   Patient also stated that she has tried to refill her cipro but every time she does, the pharmacist stated that Dr. Jenny Reichmann has to authorize it and he has not done so. She stated she still has some urinary frequency and occasional discomfort and burning, but this does not occur at all times. According to a review of patient's office visit with Dr. Jenny Reichmann on 10/10, patient's urine culture was negative and it was recommended that she possibly see GYN if symptoms did not improve, (likely diagnosis of OAB per notes). RNCM assessed if patient has called to talk with Dr. Jenny Reichmann about why he has not approved her refill of Cipro, but she stated that she has not called because she is frustrated with the inability to  leave a message or talk directly with someone when she calls.  RNCM encouraged patient to reach out to Dr. Jenny Reichmann (thyroid biopsy) and Worthy Flank, PA (CT scan) regarding her test results. Advised also to talk with Dr. Jenny Reichmann about her request for Cipro as he may not feel she needs it at this time and may have other recommendations.  RNCM to route note to PCP and make him aware of patient's concerns regarding her request for Cipro and also her thyroid biopsy results.   Also discussed care management needs with patient. Patient has no community care management needs at this time, however does appreciate follow up and education and assistance when needed. Patient was agreeable to transitioning to health coaching for heart failure follow up.  RNCM to make referral to health coach for continued outreach for patient.  [name], RN, BSN, [place] Management Coordinator [phone]

## 2017-08-30 ENCOUNTER — Telehealth: Payer: Self-pay | Admitting: Radiation Oncology

## 2017-08-30 DIAGNOSIS — C3411 Malignant neoplasm of upper lobe, right bronchus or lung: Secondary | ICD-10-CM

## 2017-08-30 NOTE — Telephone Encounter (Signed)
LM on home phone for pt to call me back regarding CT scan results.

## 2017-08-31 ENCOUNTER — Telehealth: Payer: Self-pay | Admitting: Internal Medicine

## 2017-08-31 ENCOUNTER — Telehealth: Payer: Self-pay | Admitting: Radiation Oncology

## 2017-08-31 ENCOUNTER — Other Ambulatory Visit: Payer: Self-pay

## 2017-08-31 NOTE — Telephone Encounter (Signed)
Unfortunately this is not possible without an OV as this is medicare rules; ok for visit when she is able  I have been unable to call son due to overwork required by my employer.  Very sorry

## 2017-08-31 NOTE — Telephone Encounter (Signed)
LM returning the patient's call.

## 2017-08-31 NOTE — Telephone Encounter (Signed)
Called pt's son, LVM

## 2017-08-31 NOTE — Telephone Encounter (Signed)
Dr. Jenny Reichmann could you put in the home health referral before I call and speak to the son. Please advise.

## 2017-08-31 NOTE — Patient Outreach (Signed)
[facility]) Care Management  08/31/2017  ANTONELA FREIMAN 11-13-1931 [id number]    1st Telephone call to patient for initial assessment.  No answer. HIPAA compliant voice message left.  Plan: RN Health Coach will make outreach attempt to patient within five business days. RN Health Will send Welcome letter.  Lazaro Arms RN, BSN, [place] Direct Dial:  [phone] Fax: [phone]
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
## 2017-09-02 NOTE — Telephone Encounter (Signed)
Patient's son called and set up an appointment for Monday 11/19

## 2017-09-05 ENCOUNTER — Other Ambulatory Visit: Payer: Self-pay

## 2017-09-05 ENCOUNTER — Ambulatory Visit: Payer: Medicare Other | Admitting: Internal Medicine

## 2017-09-05 NOTE — Patient Outreach (Signed)
[facility]) Care Management  09/05/2017  Kayla Kent [birth year] [id number]   2nd Telephone call to the patient for initial assessment. HIPAA verified. The patient stated that she is having an air conditioner put in the home this morning and would like me to call her back.  Plan: RN Health Coach will make outreach attempt within three business days.  Lazaro Arms RN, BSN, [place] Direct Dial:  [phone] Fax: [phone]

## 2017-09-06 ENCOUNTER — Other Ambulatory Visit: Payer: Self-pay

## 2017-09-06 ENCOUNTER — Inpatient Hospital Stay (HOSPITAL_COMMUNITY)
Admission: EM | Admit: 2017-09-06 | Discharge: 2017-09-16 | DRG: 981 | Disposition: A | Discharge status: Rehabilitation Facility | Payer: Medicare Other | Attending: Internal Medicine | Admitting: Internal Medicine

## 2017-09-06 ENCOUNTER — Encounter (HOSPITAL_COMMUNITY): Payer: Self-pay | Admitting: Emergency Medicine

## 2017-09-06 ENCOUNTER — Emergency Department (HOSPITAL_COMMUNITY): Payer: Medicare Other

## 2017-09-06 DIAGNOSIS — J449 Chronic obstructive pulmonary disease, unspecified: Secondary | ICD-10-CM | POA: Insufficient documentation

## 2017-09-06 DIAGNOSIS — K449 Diaphragmatic hernia without obstruction or gangrene: Secondary | ICD-10-CM | POA: Diagnosis not present

## 2017-09-06 DIAGNOSIS — J9601 Acute respiratory failure with hypoxia: Secondary | ICD-10-CM | POA: Diagnosis present

## 2017-09-06 DIAGNOSIS — I63412 Cerebral infarction due to embolism of left middle cerebral artery: Secondary | ICD-10-CM | POA: Diagnosis not present

## 2017-09-06 DIAGNOSIS — I69391 Dysphagia following cerebral infarction: Secondary | ICD-10-CM | POA: Diagnosis not present

## 2017-09-06 DIAGNOSIS — J690 Pneumonitis due to inhalation of food and vomit: Secondary | ICD-10-CM | POA: Diagnosis not present

## 2017-09-06 DIAGNOSIS — J309 Allergic rhinitis, unspecified: Secondary | ICD-10-CM | POA: Diagnosis present

## 2017-09-06 DIAGNOSIS — I482 Chronic atrial fibrillation, unspecified: Secondary | ICD-10-CM | POA: Diagnosis present

## 2017-09-06 DIAGNOSIS — I639 Cerebral infarction, unspecified: Secondary | ICD-10-CM

## 2017-09-06 DIAGNOSIS — Z681 Body mass index (BMI) 19 or less, adult: Secondary | ICD-10-CM

## 2017-09-06 DIAGNOSIS — I739 Peripheral vascular disease, unspecified: Secondary | ICD-10-CM | POA: Diagnosis present

## 2017-09-06 DIAGNOSIS — R29818 Other symptoms and signs involving the nervous system: Secondary | ICD-10-CM | POA: Diagnosis not present

## 2017-09-06 DIAGNOSIS — Z8261 Family history of arthritis: Secondary | ICD-10-CM

## 2017-09-06 DIAGNOSIS — I4891 Unspecified atrial fibrillation: Secondary | ICD-10-CM

## 2017-09-06 DIAGNOSIS — Z923 Personal history of irradiation: Secondary | ICD-10-CM

## 2017-09-06 DIAGNOSIS — D72829 Elevated white blood cell count, unspecified: Secondary | ICD-10-CM

## 2017-09-06 DIAGNOSIS — Z87891 Personal history of nicotine dependence: Secondary | ICD-10-CM | POA: Diagnosis not present

## 2017-09-06 DIAGNOSIS — Z818 Family history of other mental and behavioral disorders: Secondary | ICD-10-CM

## 2017-09-06 DIAGNOSIS — I480 Paroxysmal atrial fibrillation: Secondary | ICD-10-CM | POA: Diagnosis not present

## 2017-09-06 DIAGNOSIS — Z4682 Encounter for fitting and adjustment of non-vascular catheter: Secondary | ICD-10-CM | POA: Diagnosis not present

## 2017-09-06 DIAGNOSIS — R4701 Aphasia: Secondary | ICD-10-CM | POA: Diagnosis not present

## 2017-09-06 DIAGNOSIS — I5033 Acute on chronic diastolic (congestive) heart failure: Secondary | ICD-10-CM | POA: Diagnosis not present

## 2017-09-06 DIAGNOSIS — N183 Chronic kidney disease, stage 3 unspecified: Secondary | ICD-10-CM | POA: Diagnosis present

## 2017-09-06 DIAGNOSIS — E860 Dehydration: Secondary | ICD-10-CM | POA: Diagnosis present

## 2017-09-06 DIAGNOSIS — R4182 Altered mental status, unspecified: Secondary | ICD-10-CM | POA: Diagnosis not present

## 2017-09-06 DIAGNOSIS — G9341 Metabolic encephalopathy: Secondary | ICD-10-CM | POA: Diagnosis not present

## 2017-09-06 DIAGNOSIS — F0280 Dementia in other diseases classified elsewhere without behavioral disturbance: Secondary | ICD-10-CM | POA: Diagnosis present

## 2017-09-06 DIAGNOSIS — K2971 Gastritis, unspecified, with bleeding: Secondary | ICD-10-CM | POA: Diagnosis not present

## 2017-09-06 DIAGNOSIS — R350 Frequency of micturition: Secondary | ICD-10-CM | POA: Diagnosis not present

## 2017-09-06 DIAGNOSIS — I6330 Cerebral infarction due to thrombosis of unspecified cerebral artery: Secondary | ICD-10-CM

## 2017-09-06 DIAGNOSIS — H5340 Unspecified visual field defects: Secondary | ICD-10-CM | POA: Diagnosis not present

## 2017-09-06 DIAGNOSIS — R8290 Unspecified abnormal findings in urine: Secondary | ICD-10-CM | POA: Diagnosis present

## 2017-09-06 DIAGNOSIS — G40909 Epilepsy, unspecified, not intractable, without status epilepticus: Secondary | ICD-10-CM

## 2017-09-06 DIAGNOSIS — I63512 Cerebral infarction due to unspecified occlusion or stenosis of left middle cerebral artery: Secondary | ICD-10-CM | POA: Diagnosis not present

## 2017-09-06 DIAGNOSIS — I5032 Chronic diastolic (congestive) heart failure: Secondary | ICD-10-CM | POA: Diagnosis not present

## 2017-09-06 DIAGNOSIS — Z880 Allergy status to penicillin: Secondary | ICD-10-CM | POA: Diagnosis not present

## 2017-09-06 DIAGNOSIS — K3189 Other diseases of stomach and duodenum: Secondary | ICD-10-CM | POA: Diagnosis not present

## 2017-09-06 DIAGNOSIS — R1312 Dysphagia, oropharyngeal phase: Secondary | ICD-10-CM | POA: Diagnosis not present

## 2017-09-06 DIAGNOSIS — K5901 Slow transit constipation: Secondary | ICD-10-CM | POA: Diagnosis not present

## 2017-09-06 DIAGNOSIS — K2951 Unspecified chronic gastritis with bleeding: Secondary | ICD-10-CM | POA: Diagnosis not present

## 2017-09-06 DIAGNOSIS — Z791 Long term (current) use of non-steroidal anti-inflammatories (NSAID): Secondary | ICD-10-CM

## 2017-09-06 DIAGNOSIS — R030 Elevated blood-pressure reading, without diagnosis of hypertension: Secondary | ICD-10-CM | POA: Diagnosis not present

## 2017-09-06 DIAGNOSIS — C3411 Malignant neoplasm of upper lobe, right bronchus or lung: Secondary | ICD-10-CM | POA: Diagnosis present

## 2017-09-06 DIAGNOSIS — N390 Urinary tract infection, site not specified: Secondary | ICD-10-CM | POA: Diagnosis not present

## 2017-09-06 DIAGNOSIS — Q211 Atrial septal defect: Secondary | ICD-10-CM

## 2017-09-06 DIAGNOSIS — R6520 Severe sepsis without septic shock: Secondary | ICD-10-CM | POA: Diagnosis not present

## 2017-09-06 DIAGNOSIS — R29726 NIHSS score 26: Secondary | ICD-10-CM | POA: Diagnosis not present

## 2017-09-06 DIAGNOSIS — J9690 Respiratory failure, unspecified, unspecified whether with hypoxia or hypercapnia: Secondary | ICD-10-CM

## 2017-09-06 DIAGNOSIS — I69922 Dysarthria following unspecified cerebrovascular disease: Secondary | ICD-10-CM | POA: Diagnosis not present

## 2017-09-06 DIAGNOSIS — R195 Other fecal abnormalities: Secondary | ICD-10-CM

## 2017-09-06 DIAGNOSIS — K921 Melena: Principal | ICD-10-CM

## 2017-09-06 DIAGNOSIS — I69354 Hemiplegia and hemiparesis following cerebral infarction affecting left non-dominant side: Secondary | ICD-10-CM | POA: Diagnosis not present

## 2017-09-06 DIAGNOSIS — E785 Hyperlipidemia, unspecified: Secondary | ICD-10-CM

## 2017-09-06 DIAGNOSIS — R400 Somnolence: Secondary | ICD-10-CM | POA: Diagnosis not present

## 2017-09-06 DIAGNOSIS — R9389 Abnormal findings on diagnostic imaging of other specified body structures: Secondary | ICD-10-CM | POA: Diagnosis not present

## 2017-09-06 DIAGNOSIS — D649 Anemia, unspecified: Secondary | ICD-10-CM | POA: Diagnosis not present

## 2017-09-06 DIAGNOSIS — R739 Hyperglycemia, unspecified: Secondary | ICD-10-CM | POA: Diagnosis not present

## 2017-09-06 DIAGNOSIS — E041 Nontoxic single thyroid nodule: Secondary | ICD-10-CM | POA: Diagnosis not present

## 2017-09-06 DIAGNOSIS — R29810 Facial weakness: Secondary | ICD-10-CM | POA: Diagnosis not present

## 2017-09-06 DIAGNOSIS — J189 Pneumonia, unspecified organism: Secondary | ICD-10-CM

## 2017-09-06 DIAGNOSIS — Z91018 Allergy to other foods: Secondary | ICD-10-CM

## 2017-09-06 DIAGNOSIS — I481 Persistent atrial fibrillation: Secondary | ICD-10-CM | POA: Diagnosis present

## 2017-09-06 DIAGNOSIS — Z1612 Extended spectrum beta lactamase (ESBL) resistance: Secondary | ICD-10-CM | POA: Diagnosis present

## 2017-09-06 DIAGNOSIS — I959 Hypotension, unspecified: Secondary | ICD-10-CM | POA: Diagnosis not present

## 2017-09-06 DIAGNOSIS — Z8349 Family history of other endocrine, nutritional and metabolic diseases: Secondary | ICD-10-CM

## 2017-09-06 DIAGNOSIS — I130 Hypertensive heart and chronic kidney disease with heart failure and stage 1 through stage 4 chronic kidney disease, or unspecified chronic kidney disease: Secondary | ICD-10-CM | POA: Diagnosis present

## 2017-09-06 DIAGNOSIS — Z91010 Allergy to peanuts: Secondary | ICD-10-CM

## 2017-09-06 DIAGNOSIS — J439 Emphysema, unspecified: Secondary | ICD-10-CM | POA: Diagnosis not present

## 2017-09-06 DIAGNOSIS — K2970 Gastritis, unspecified, without bleeding: Secondary | ICD-10-CM | POA: Diagnosis not present

## 2017-09-06 DIAGNOSIS — I69991 Dysphagia following unspecified cerebrovascular disease: Secondary | ICD-10-CM | POA: Diagnosis not present

## 2017-09-06 DIAGNOSIS — G8191 Hemiplegia, unspecified affecting right dominant side: Secondary | ICD-10-CM | POA: Diagnosis not present

## 2017-09-06 DIAGNOSIS — E871 Hypo-osmolality and hyponatremia: Secondary | ICD-10-CM | POA: Diagnosis not present

## 2017-09-06 DIAGNOSIS — R531 Weakness: Secondary | ICD-10-CM | POA: Diagnosis not present

## 2017-09-06 DIAGNOSIS — E876 Hypokalemia: Secondary | ICD-10-CM | POA: Diagnosis not present

## 2017-09-06 DIAGNOSIS — I69351 Hemiplegia and hemiparesis following cerebral infarction affecting right dominant side: Secondary | ICD-10-CM | POA: Diagnosis not present

## 2017-09-06 DIAGNOSIS — R4702 Dysphasia: Secondary | ICD-10-CM | POA: Diagnosis not present

## 2017-09-06 DIAGNOSIS — Z888 Allergy status to other drugs, medicaments and biological substances status: Secondary | ICD-10-CM

## 2017-09-06 DIAGNOSIS — R509 Fever, unspecified: Secondary | ICD-10-CM | POA: Diagnosis not present

## 2017-09-06 DIAGNOSIS — I471 Supraventricular tachycardia: Secondary | ICD-10-CM | POA: Diagnosis present

## 2017-09-06 DIAGNOSIS — A419 Sepsis, unspecified organism: Secondary | ICD-10-CM | POA: Diagnosis not present

## 2017-09-06 DIAGNOSIS — Z8719 Personal history of other diseases of the digestive system: Secondary | ICD-10-CM | POA: Diagnosis not present

## 2017-09-06 DIAGNOSIS — Z9049 Acquired absence of other specified parts of digestive tract: Secondary | ICD-10-CM

## 2017-09-06 DIAGNOSIS — F329 Major depressive disorder, single episode, unspecified: Secondary | ICD-10-CM | POA: Diagnosis present

## 2017-09-06 DIAGNOSIS — Z8249 Family history of ischemic heart disease and other diseases of the circulatory system: Secondary | ICD-10-CM

## 2017-09-06 DIAGNOSIS — I509 Heart failure, unspecified: Secondary | ICD-10-CM

## 2017-09-06 DIAGNOSIS — F411 Generalized anxiety disorder: Secondary | ICD-10-CM | POA: Diagnosis not present

## 2017-09-06 DIAGNOSIS — E440 Moderate protein-calorie malnutrition: Secondary | ICD-10-CM | POA: Diagnosis not present

## 2017-09-06 DIAGNOSIS — I5031 Acute diastolic (congestive) heart failure: Secondary | ICD-10-CM | POA: Diagnosis present

## 2017-09-06 DIAGNOSIS — E43 Unspecified severe protein-calorie malnutrition: Secondary | ICD-10-CM | POA: Diagnosis not present

## 2017-09-06 DIAGNOSIS — R410 Disorientation, unspecified: Secondary | ICD-10-CM | POA: Diagnosis not present

## 2017-09-06 DIAGNOSIS — Z882 Allergy status to sulfonamides status: Secondary | ICD-10-CM

## 2017-09-06 DIAGNOSIS — N179 Acute kidney failure, unspecified: Secondary | ICD-10-CM | POA: Diagnosis not present

## 2017-09-06 DIAGNOSIS — D500 Iron deficiency anemia secondary to blood loss (chronic): Secondary | ICD-10-CM | POA: Diagnosis not present

## 2017-09-06 DIAGNOSIS — R569 Unspecified convulsions: Secondary | ICD-10-CM | POA: Diagnosis not present

## 2017-09-06 DIAGNOSIS — Z96641 Presence of right artificial hip joint: Secondary | ICD-10-CM | POA: Diagnosis present

## 2017-09-06 DIAGNOSIS — I69320 Aphasia following cerebral infarction: Secondary | ICD-10-CM | POA: Diagnosis not present

## 2017-09-06 DIAGNOSIS — Z01818 Encounter for other preprocedural examination: Secondary | ICD-10-CM

## 2017-09-06 DIAGNOSIS — R1310 Dysphagia, unspecified: Secondary | ICD-10-CM | POA: Diagnosis not present

## 2017-09-06 DIAGNOSIS — I6602 Occlusion and stenosis of left middle cerebral artery: Secondary | ICD-10-CM | POA: Diagnosis not present

## 2017-09-06 DIAGNOSIS — I6350 Cerebral infarction due to unspecified occlusion or stenosis of unspecified cerebral artery: Secondary | ICD-10-CM | POA: Diagnosis not present

## 2017-09-06 DIAGNOSIS — I10 Essential (primary) hypertension: Secondary | ICD-10-CM | POA: Diagnosis present

## 2017-09-06 DIAGNOSIS — R627 Adult failure to thrive: Secondary | ICD-10-CM | POA: Diagnosis not present

## 2017-09-06 DIAGNOSIS — R918 Other nonspecific abnormal finding of lung field: Secondary | ICD-10-CM | POA: Diagnosis not present

## 2017-09-06 DIAGNOSIS — F0390 Unspecified dementia without behavioral disturbance: Secondary | ICD-10-CM | POA: Diagnosis not present

## 2017-09-06 DIAGNOSIS — N3000 Acute cystitis without hematuria: Secondary | ICD-10-CM | POA: Diagnosis not present

## 2017-09-06 DIAGNOSIS — D473 Essential (hemorrhagic) thrombocythemia: Secondary | ICD-10-CM | POA: Diagnosis not present

## 2017-09-06 DIAGNOSIS — D62 Acute posthemorrhagic anemia: Secondary | ICD-10-CM | POA: Diagnosis present

## 2017-09-06 DIAGNOSIS — Z7901 Long term (current) use of anticoagulants: Secondary | ICD-10-CM

## 2017-09-06 DIAGNOSIS — J90 Pleural effusion, not elsewhere classified: Secondary | ICD-10-CM | POA: Diagnosis not present

## 2017-09-06 DIAGNOSIS — K922 Gastrointestinal hemorrhage, unspecified: Secondary | ICD-10-CM | POA: Diagnosis not present

## 2017-09-06 DIAGNOSIS — I700 Atherosclerosis of aorta: Secondary | ICD-10-CM | POA: Diagnosis present

## 2017-09-06 DIAGNOSIS — Z885 Allergy status to narcotic agent status: Secondary | ICD-10-CM

## 2017-09-06 DIAGNOSIS — E872 Acidosis: Secondary | ICD-10-CM | POA: Diagnosis not present

## 2017-09-06 DIAGNOSIS — R339 Retention of urine, unspecified: Secondary | ICD-10-CM | POA: Diagnosis not present

## 2017-09-06 DIAGNOSIS — L8990 Pressure ulcer of unspecified site, unspecified stage: Secondary | ICD-10-CM

## 2017-09-06 DIAGNOSIS — F419 Anxiety disorder, unspecified: Secondary | ICD-10-CM | POA: Diagnosis present

## 2017-09-06 DIAGNOSIS — Z91040 Latex allergy status: Secondary | ICD-10-CM

## 2017-09-06 DIAGNOSIS — I69322 Dysarthria following cerebral infarction: Secondary | ICD-10-CM | POA: Diagnosis not present

## 2017-09-06 DIAGNOSIS — K219 Gastro-esophageal reflux disease without esophagitis: Secondary | ICD-10-CM | POA: Diagnosis present

## 2017-09-06 DIAGNOSIS — R404 Transient alteration of awareness: Secondary | ICD-10-CM | POA: Diagnosis not present

## 2017-09-06 DIAGNOSIS — F4489 Other dissociative and conversion disorders: Secondary | ICD-10-CM | POA: Diagnosis not present

## 2017-09-06 LAB — CBC
HEMATOCRIT: 38.8 % (ref 36.0–46.0)
HEMATOCRIT: 35.7 % — AB (ref 36.0–46.0)
HEMOGLOBIN: 12.7 g/dL (ref 12.0–15.0)
Hemoglobin: 11.7 g/dL — ABNORMAL LOW (ref 12.0–15.0)
MCH: 30.8 pg (ref 26.0–34.0)
MCH: 31 pg (ref 26.0–34.0)
MCHC: 32.7 g/dL (ref 30.0–36.0)
MCHC: 32.8 g/dL (ref 30.0–36.0)
MCV: 93.9 fL (ref 78.0–100.0)
MCV: 94.4 fL (ref 78.0–100.0)
Platelets: 185 10*3/uL (ref 150–400)
Platelets: 170 10*3/uL (ref 150–400)
RBC: 4.13 MIL/uL (ref 3.87–5.11)
RBC: 3.78 MIL/uL — ABNORMAL LOW (ref 3.87–5.11)
RDW: 15 % (ref 11.5–15.5)
RDW: 15.2 % (ref 11.5–15.5)
WBC: 17.6 10*3/uL — AB (ref 4.0–10.5)
WBC: 22.1 10*3/uL — AB (ref 4.0–10.5)

## 2017-09-06 LAB — COMPREHENSIVE METABOLIC PANEL
ALT: 34 U/L (ref 14–54)
AST: 66 U/L — AB (ref 15–41)
Albumin: 2.6 g/dL — ABNORMAL LOW (ref 3.5–5.0)
Alkaline Phosphatase: 105 U/L (ref 38–126)
Anion gap: 10 (ref 5–15)
BUN: 31 mg/dL — AB (ref 6–20)
CHLORIDE: 102 mmol/L (ref 101–111)
CO2: 26 mmol/L (ref 22–32)
Calcium: 8.4 mg/dL — ABNORMAL LOW (ref 8.9–10.3)
Creatinine, Ser: 1.51 mg/dL — ABNORMAL HIGH (ref 0.44–1.00)
GFR, EST AFRICAN AMERICAN: 36 mL/min — AB (ref >60)
GFR, EST NON AFRICAN AMERICAN: 31 mL/min — AB (ref >60)
Glucose, Bld: 106 mg/dL — ABNORMAL HIGH (ref 65–99)
POTASSIUM: 3.8 mmol/L (ref 3.5–5.1)
Sodium: 138 mmol/L (ref 135–145)
Total Bilirubin: 1.2 mg/dL (ref 0.3–1.2)
Total Protein: 5.1 g/dL — ABNORMAL LOW (ref 6.5–8.1)

## 2017-09-06 LAB — I-STAT CG4 LACTIC ACID, ED
LACTIC ACID, VENOUS: 2.44 mmol/L — AB (ref 0.5–1.9)
LACTIC ACID, VENOUS: 2.05 mmol/L — AB (ref 0.5–1.9)

## 2017-09-06 LAB — MAGNESIUM: MAGNESIUM: 1.4 mg/dL — AB (ref 1.7–2.4)

## 2017-09-06 LAB — URINALYSIS, ROUTINE W REFLEX MICROSCOPIC
Bilirubin Urine: NEGATIVE
GLUCOSE, UA: NEGATIVE mg/dL
Ketones, ur: NEGATIVE mg/dL
NITRITE: NEGATIVE
Protein, ur: NEGATIVE mg/dL
SPECIFIC GRAVITY, URINE: 1.02 (ref 1.005–1.030)
pH: 5 (ref 5.0–8.0)

## 2017-09-06 LAB — TYPE AND SCREEN
ABO/RH(D): B POS
Antibody Screen: NEGATIVE

## 2017-09-06 LAB — MRSA PCR SCREENING: MRSA BY PCR: NEGATIVE

## 2017-09-06 LAB — LACTIC ACID, PLASMA: Lactic Acid, Venous: 3.7 mmol/L (ref 0.5–1.9)

## 2017-09-06 LAB — POC OCCULT BLOOD, ED: FECAL OCCULT BLD: POSITIVE — AB

## 2017-09-06 LAB — BRAIN NATRIURETIC PEPTIDE: B Natriuretic Peptide: 722.5 pg/mL — ABNORMAL HIGH (ref 0.0–100.0)

## 2017-09-06 MED ORDER — SODIUM CHLORIDE 0.9 % IV BOLUS (SEPSIS)
1000.0000 mL | Freq: Once | INTRAVENOUS | Status: AC
  Administered 2017-09-06: 1000 mL via INTRAVENOUS

## 2017-09-06 MED ORDER — IPRATROPIUM-ALBUTEROL 0.5-2.5 (3) MG/3ML IN SOLN
3.0000 mL | Freq: Four times a day (QID) | RESPIRATORY_TRACT | Status: DC
  Administered 2017-09-06 – 2017-09-08 (×7): 3 mL via RESPIRATORY_TRACT
  Filled 2017-09-06 (×7): qty 3

## 2017-09-06 MED ORDER — ACETAMINOPHEN 650 MG RE SUPP: 650.0000 mg | Freq: Four times a day (QID) | RECTAL | Status: DC | PRN

## 2017-09-06 MED ORDER — CIPROFLOXACIN IN D5W 400 MG/200ML IV SOLN
400.0000 mg | Freq: Two times a day (BID) | INTRAVENOUS | Status: DC
  Administered 2017-09-07: 400 mg via INTRAVENOUS
  Filled 2017-09-06: qty 200

## 2017-09-06 MED ORDER — SODIUM CHLORIDE 0.9 % IV SOLN
INTRAVENOUS | Status: DC
Start: 2017-09-06 — End: 2017-09-08
  Administered 2017-09-06: 16:00:00 via INTRAVENOUS
  Administered 2017-09-07: 1000 mL via INTRAVENOUS
  Administered 2017-09-08: 07:00:00 via INTRAVENOUS

## 2017-09-06 MED ORDER — PANTOPRAZOLE SODIUM 40 MG IV SOLR
40.0000 mg | Freq: Once | INTRAVENOUS | Status: AC
  Administered 2017-09-06: 40 mg via INTRAVENOUS
  Filled 2017-09-06: qty 40

## 2017-09-06 MED ORDER — ALPRAZOLAM 0.25 MG PO TABS
0.2500 mg | ORAL_TABLET | Freq: Two times a day (BID) | ORAL | Status: DC | PRN
  Administered 2017-09-07 – 2017-09-10 (×2): 0.25 mg via ORAL
  Filled 2017-09-06 (×3): qty 1

## 2017-09-06 MED ORDER — TRAZODONE HCL 50 MG PO TABS
50.0000 mg | ORAL_TABLET | Freq: Every day | ORAL | Status: DC
  Administered 2017-09-06 – 2017-09-10 (×5): 50 mg via ORAL
  Filled 2017-09-06 (×6): qty 1

## 2017-09-06 MED ORDER — SODIUM CHLORIDE 0.9 % IV SOLN
250.0000 mg | Freq: Two times a day (BID) | INTRAVENOUS | Status: DC
  Administered 2017-09-06 – 2017-09-11 (×10): 250 mg via INTRAVENOUS
  Filled 2017-09-06 (×11): qty 2.5

## 2017-09-06 MED ORDER — ACETAMINOPHEN 325 MG PO TABS
650.0000 mg | ORAL_TABLET | Freq: Four times a day (QID) | ORAL | Status: DC | PRN
  Administered 2017-09-09 – 2017-09-10 (×2): 650 mg via ORAL
  Filled 2017-09-06 (×2): qty 2

## 2017-09-06 MED ORDER — SALINE SPRAY 0.65 % NA SOLN
1.0000 | NASAL | Status: DC | PRN
  Filled 2017-09-06: qty 44

## 2017-09-06 MED ORDER — CIPROFLOXACIN IN D5W 400 MG/200ML IV SOLN
400.0000 mg | Freq: Once | INTRAVENOUS | Status: DC
  Administered 2017-09-06: 400 mg via INTRAVENOUS
  Filled 2017-09-06: qty 200

## 2017-09-06 MED ORDER — SODIUM CHLORIDE 0.9% FLUSH
3.0000 mL | Freq: Two times a day (BID) | INTRAVENOUS | Status: DC
  Administered 2017-09-06 – 2017-09-12 (×12): 3 mL via INTRAVENOUS
  Administered 2017-09-12: 10 mL via INTRAVENOUS
  Administered 2017-09-13 – 2017-09-16 (×7): 3 mL via INTRAVENOUS

## 2017-09-06 MED ORDER — SODIUM CHLORIDE 0.9 % IV BOLUS (SEPSIS)
500.0000 mL | Freq: Once | INTRAVENOUS | Status: AC
  Administered 2017-09-06: 500 mL via INTRAVENOUS

## 2017-09-06 MED ORDER — PANTOPRAZOLE SODIUM 40 MG IV SOLR
40.0000 mg | Freq: Two times a day (BID) | INTRAVENOUS | Status: DC
  Administered 2017-09-06 – 2017-09-09 (×6): 40 mg via INTRAVENOUS
  Filled 2017-09-06 (×6): qty 40

## 2017-09-06 MED ORDER — ONDANSETRON HCL 4 MG PO TABS: 4.0000 mg | ORAL_TABLET | Freq: Four times a day (QID) | ORAL | Status: DC | PRN

## 2017-09-06 MED ORDER — ONDANSETRON HCL 4 MG/2ML IJ SOLN: 4.0000 mg | Freq: Four times a day (QID) | INTRAMUSCULAR | Status: DC | PRN

## 2017-09-06 MED ORDER — SODIUM CHLORIDE 0.9 % IV SOLN
Freq: Once | INTRAVENOUS | Status: DC
Start: 2017-09-06 — End: 2017-09-06

## 2017-09-06 MED ORDER — OXYBUTYNIN CHLORIDE ER 10 MG PO TB24
10.0000 mg | ORAL_TABLET | Freq: Every day | ORAL | Status: DC
  Administered 2017-09-06 – 2017-09-10 (×5): 10 mg via ORAL
  Filled 2017-09-06 (×8): qty 1

## 2017-09-06 MED ORDER — METOPROLOL TARTRATE 5 MG/5ML IV SOLN
5.0000 mg | Freq: Four times a day (QID) | INTRAVENOUS | Status: DC
  Administered 2017-09-07 (×2): 5 mg via INTRAVENOUS
  Filled 2017-09-06 (×3): qty 5

## 2017-09-06 NOTE — ED Notes (Signed)
Patient transported to X-ray 

## 2017-09-06 NOTE — ED Notes (Signed)
Patient transported to CT 
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
## 2017-09-06 NOTE — ED Notes (Signed)
Pt given apple juice, per Admitting.
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
## 2017-09-06 NOTE — ED Provider Notes (Signed)
[facility] EMERGENCY DEPARTMENT Provider Note   CSN: [id number] Arrival date & time: 09/06/17  1045     History   Chief Complaint Chief Complaint  Patient presents with  . Fall  . GI Bleeding    HPI Kayla Kent is a 83 y.o. female.  HPI  Patient presents via EMS after being found by home repair worker on the floor.  Patient states that yesterday evening she felt very tired and went to bed early.  She states she vomited last night and the color was red however she does state that she ate tomato soup.  Per EMS her toilet had dark tarry stools in it when they arrived.  Patient cannot elaborate on this.  She does not remember fainting but states that she slid off of her bed due to feeling very weak.  Per EMS she was initially confused.  She is a poor historian but is alert on my evaluation.  She takes Eliquis for A. fib.  She denies any abdominal pain or chest pain.  No difficulty breathing.  Past Medical History:  Diagnosis Date  . Allergic rhinitis   . Anxiety   . CKD (chronic kidney disease) stage 3, GFR 30-59 ml/min (HCC)   . Emphysema of lung ([place])   . HLD (hyperlipidemia)   . HTN (hypertension)   . Intolerance of drug    orthostatic  . Paroxysmal supraventricular tachycardia ([place])   . PVD (peripheral vascular disease) ([place])   . Seizure ([place])   . Syncope and collapse   . Uterine prolapse without mention of vaginal wall prolapse     Patient Active Problem List   Diagnosis Date Noted  . Acute upper GI bleed 09/06/2017  . HTN (hypertension) 09/06/2017  . Seizure disorder ([place]) 09/06/2017  . Chronic atrial fibrillation ([place]) 09/06/2017  . Abnormal urinalysis 09/06/2017  . Thyroid nodule 09/06/2017  . CKD (chronic kidney disease), stage III ([place]) 09/06/2017  . Dyslipidemia 09/06/2017  . COPD (chronic obstructive pulmonary disease) ([place]) 09/06/2017  . CKD (chronic kidney disease) stage 3, GFR 30-59 ml/min (HCC)   . Malignant neoplasm of right  upper lobe of lung ([place]) 06/21/2017  . Pulmonary emphysema ([place]) 04/26/2017  . Pleural effusion 04/26/2017  . Near syncope   . Gastroesophageal reflux disease   . Chest pain 04/14/2017  . Lung nodule < 6cm on CT 04/12/2017  . Acute renal failure ([place])   . Sepsis ([place]) 04/06/2017  . Atrial fibrillation with RVR ([place]) 04/06/2017  . Urinary frequency 03/03/2017  . Hemorrhoids 03/03/2017  . Insomnia 03/03/2017  . Postoperative anemia due to acute blood loss 11/24/2016  . Hematoma left thigh 11/24/2016  . Closed femur fracture ([place]) 11/20/2016  . Pedal edema 10/10/2016  . Persistent atrial fibrillation ([place]) 12/11/2014  . S/P ablation of atrial fibrillation 12/11/2014  . Orthostatic hypotension 12/11/2014  . Paroxysmal SVT (supraventricular tachycardia) ([place]) 12/11/2014  . Atherosclerotic peripheral vascular disease ([place]) 12/11/2014  . PAF (paroxysmal atrial fibrillation) ([place]) 11/05/2014  . Orthostasis 10/03/2014  . Non-compliant behavior 10/01/2014  . TIA (transient ischemic attack) 09/23/2014  . Numbness of left hand   . Acute on chronic diastolic heart failure ([place]) 09/21/2014  . Fracture of femoral neck, right ([place]) 08/28/2014  . Distal radius fracture, right 08/28/2014  . Hip fracture requiring operative repair ([place]) 08/28/2014  . Paroxysmal spells 03/28/2014  . Seizures ([place]) 03/28/2013  . Syncope and collapse 07/27/2012  . Routine health maintenance 12/12/2011  . PERIPHERAL CIRCULATORY DISORDER 04/10/2010  .  Chronic diastolic CHF (congestive heart failure) ([place]) 07/17/2009  . Depression 07/04/2009  . Anxiety 01/19/2008  . HLD (hyperlipidemia) 11/14/2007  . Essential hypertension 11/14/2007  . PAROXYSMAL ATRIAL TACHYCARDIA 11/14/2007  . UTERINE PROLAPSE 11/14/2007    Past Surgical History:  Procedure Laterality Date  . CARDIOVERSION N/A 11/05/2014   Procedure: CARDIOVERSION;  Surgeon: Candee Furbish, MD;  Location: [facility] ENDOSCOPY;  Service: Cardiovascular;  Laterality:  N/A;  . cataract surgery  08/2015  . CHOLECYSTECTOMY    . corrective eye surgery     as a child  . INTRAMEDULLARY (IM) NAIL INTERTROCHANTERIC Left 11/21/2016   Procedure: INTRAMEDULLARY (IM) NAIL INTERTROCHANTRIC HEMI;  Surgeon: Newt Minion, MD;  Location: [facility];  Service: Orthopedics;  Laterality: Left;  . IVD removed    . OPEN REDUCTION INTERNAL FIXATION (ORIF) DISTAL RADIAL FRACTURE Right 08/29/2014   Procedure: OPEN REDUCTION INTERNAL FIXATION (ORIF) DISTAL RADIAL FRACTURE;  Surgeon: Marianna Payment, MD;  Location: [facility];  Service: Orthopedics;  Laterality: Right;  . RF ablation PSVT     summer '10  . stress cardiolite  08/05/93  . TONSILLECTOMY    . TOTAL HIP ARTHROPLASTY Right 08/29/2014   Procedure: Right Hip Hemi Arthroplasty;  Surgeon: Marianna Payment, MD;  Location: [facility];  Service: Orthopedics;  Laterality: Right;  Hip procedure 1st wants Peg Board, [employer], [place].     OB History    No data available       Home Medications    Prior to Admission medications   Medication Sig Start Date End Date Taking? Authorizing Provider  ALPRAZolam Duanne Moron) 0.25 MG tablet TAKE 1 TABLET BY MOUTH TWICE DAILY AS NEEDED 07/12/17  Yes Biagio Borg, MD  amLODipine (NORVASC) 2.5 MG tablet TAKE ONE TABLET BY MOUTH ONCE DAILY 04/01/17  Yes Fay Records, MD  atorvastatin (LIPITOR) 10 MG tablet TAKE ONE TABLET BY MOUTH ONCE DAILY AT 6 PM 06/27/17  Yes Bhagat, Bhavinkumar, PA  bismuth subsalicylate (PEPTO BISMOL) 262 MG/15ML suspension Take 30 mLs by mouth every 6 (six) hours as needed for indigestion.   Yes [provider]  DILT-XR 180 MG 24 hr capsule TAKE 1 CAPSULE BY MOUTH ONCE DAILY 06/10/17  Yes Biagio Borg, MD  ELIQUIS 2.5 MG TABS tablet TAKE ONE TABLET BY MOUTH TWICE DAILY 10/08/16  Yes Fay Records, MD  levETIRAcetam (KEPPRA) 250 MG tablet Take 1 tablet (250 mg total) by mouth 2 (two) times daily. 05/09/17  Yes Dohmeier, Asencion Partridge, MD  oxybutynin (DITROPAN XL) 10 MG  24 hr tablet Take 1 tablet (10 mg total) by mouth at bedtime. 07/28/17  Yes Biagio Borg, MD  pantoprazole (PROTONIX) 40 MG tablet Take 1 tablet (40 mg total) by mouth daily. 06/28/17  Yes Biagio Borg, MD  sodium chloride (OCEAN) 0.65 % SOLN nasal spray Place 1 spray into both nostrils as needed for congestion.   Yes [provider]  traZODone (DESYREL) 50 MG tablet Take 50 mg by mouth at bedtime.  06/07/17  Yes [provider]  ciprofloxacin (CIPRO) 250 MG tablet Take 1 tablet (250 mg total) by mouth 2 (two) times daily. Patient not taking: Reported on 09/06/2017 07/12/17   Nche, Charlene Brooke, NP  ibuprofen (ADVIL,MOTRIN) 600 MG tablet Take 1 tablet (600 mg total) by mouth every 6 (six) hours as needed. Patient not taking: Reported on 09/06/2017 06/27/17   Fatima Blank, MD  methocarbamol (ROBAXIN) 500 MG tablet Take 1 tablet (500 mg total)  by mouth at bedtime as needed for muscle spasms. Patient not taking: Reported on 09/06/2017 07/18/17   Biagio Borg, MD  traMADol (ULTRAM) 50 MG tablet Take 1-2 tablets (50-100 mg total) by mouth every 6 (six) hours as needed for moderate pain or severe pain. Patient not taking: Reported on 09/06/2017 11/25/16   Rama, Venetia Maxon, MD    Family History Family History  Problem Relation Age of Onset  . Mental illness Father        suicide  . Arthritis Father   . Hyperlipidemia Sister   . Hypertension Sister   . Heart disease Brother        CAD/MI  . Hypertension Brother   . Hypertension Unknown        family hx  . Colon cancer Neg Hx   . Breast cancer Neg Hx   . Diabetes Neg Hx   . Stroke Neg Hx   . Cancer Neg Hx   . Lung disease Neg Hx     Social History Social History   Tobacco Use  . Smoking status: Former Smoker    Packs/day: 0.25    Years: 38.00    Pack years: 9.50    Start date: 02/15/1952    Last attempt to quit: 10/18/1993    Years since quitting: 23.9  . Smokeless tobacco: Never Used  Substance Use Topics   . Alcohol use: No    Alcohol/week: 0.0 oz  . Drug use: No     Allergies   Chocolate; Codeine; Diazepam; Fruit & vegetable daily [nutritional supplements]; Iohexol; Latex; Peach [prunus persica]; Peanut-containing drug products; Penicillins; Strawberry extract; Sulfonamide derivatives; Wheat; Wheat bran; Sulfamethoxazole; Crestor [rosuvastatin calcium]; and Iodine   Review of Systems Review of Systems  ROS reviewed and all otherwise negative except for mentioned in HPI   Physical Exam Updated Vital Signs BP 128/70   Pulse 85   Temp 98.1 F (36.7 C) (Rectal)   Resp 17   SpO2 92%  Vitals reviewed Physical Exam  Physical Examination: General appearance - alert, somewhat disheveled, and in no distress Mental status - alert, oriented to person, place, and time Eyes - pupils equal and reactive, extraocular eye movements intact, chronic right eye deviation Mouth - mucous membranes dry, OP clear Neck - supple, no significant adenopathy Chest - clear to auscultation, no wheezes, rales or rhonchi, symmetric air entry Heart - normal rate, regular rhythm, normal S1, S2, no murmurs, rubs, clicks or gallops Abdomen - soft, nontender, nondistended, no masses or organomegaly Neurological - alert, oriented x 3, tired appearing, somewhat rambling speech, no cranial nerve deficit, strength 5/5 in extremities x 4, sensation intact Extremities - peripheral pulses normal, no pedal edema, no clubbing or cyanosis Skin - normal coloration and turgor, no rashes   ED Treatments / Results  Labs (all labs ordered are listed, but only abnormal results are displayed) Labs Reviewed  COMPREHENSIVE METABOLIC PANEL - Abnormal; Notable for the following components:      Result Value   Glucose, Bld 106 (*)    BUN 31 (*)    Creatinine, Ser 1.51 (*)    Calcium 8.4 (*)    Total Protein 5.1 (*)    Albumin 2.6 (*)    AST 66 (*)    GFR calc non Af Amer 31 (*)    GFR calc Af Amer 36 (*)    All other  components within normal limits  CBC - Abnormal; Notable for the following components:   WBC 17.6 (*)  All other components within normal limits  URINALYSIS, ROUTINE W REFLEX MICROSCOPIC - Abnormal; Notable for the following components:   Color, Urine AMBER (*)    APPearance HAZY (*)    Hgb urine dipstick LARGE (*)    Leukocytes, UA TRACE (*)    Bacteria, UA MANY (*)    Squamous Epithelial / LPF 0-5 (*)    All other components within normal limits  POC OCCULT BLOOD, ED - Abnormal; Notable for the following components:   Fecal Occult Bld POSITIVE (*)    All other components within normal limits  I-STAT CG4 LACTIC ACID, ED - Abnormal; Notable for the following components:   Lactic Acid, Venous 2.44 (*)    All other components within normal limits  I-STAT CG4 LACTIC ACID, ED - Abnormal; Notable for the following components:   Lactic Acid, Venous 2.05 (*)    All other components within normal limits  CULTURE, BLOOD (ROUTINE X 2)  CULTURE, BLOOD (ROUTINE X 2)  URINE CULTURE  OCCULT BLOOD X 1 CARD TO LAB, STOOL  CBC  TYPE AND SCREEN    EKG  EKG Interpretation  Date/Time:  Tuesday September 06 2017 11:10:10 EST Ventricular Rate:  88 PR Interval:    QRS Duration: 91 QT Interval:  440 QTC Calculation: 533 R Axis:   87 Text Interpretation:  Atrial fibrillation Ventricular premature complex Anterior infarct, old Nonspecific T abnormalities, lateral leads Prolonged QT interval Since previous tracing QT interval has increased Confirmed by Alfonzo Beers [phone]) on 09/06/2017 12:19:40 PM       Radiology Dg Chest 2 View  Result Date: 09/06/2017 CLINICAL DATA:  Dizziness.  Vomiting.  Third mental status . EXAM: CHEST  2 VIEW COMPARISON:  CT 08/23/2017. PET-CT 05/25/2017. Chest x-ray 04/14/2017. FINDINGS: Cardiomegaly with increase interstitial prominence bilaterally, particular on the left. Small bilateral pleural effusions cannot be excluded. Findings suggest CHF. Bilateral  pneumonia cannot be excluded. Reference is made to prior PET-CT report of 05/25/2017. IMPRESSION: Findings consistent with congestive heart failure with bilateral from interstitial edema. Small bilateral pleural effusions cannot be excluded. Electronically Signed   By: Marcello Moores  Register   On: 09/06/2017 13:36   Ct Head Wo Contrast  Result Date: 09/06/2017 CLINICAL DATA:  Altered mental status with questionable fall. Slurred speech. EXAM: CT HEAD WITHOUT CONTRAST TECHNIQUE: Contiguous axial images were obtained from the base of the skull through the vertex without intravenous contrast. COMPARISON:  April 06, 2017 head CT; brain MRI September 23, 2014 FINDINGS: Brain: There is mild diffuse atrophy. There is no intracranial mass, hemorrhage, extra-axial fluid collection, or midline shift. There is chronic right temporal lobe encephalomalacia, stable. There is a prior infarct involving the head of the caudate nucleus on the right. There is patchy small vessel disease in the centra semiovale bilaterally, stable. No acute infarct demonstrable on this study. Vascular: No hyperdense vessel. There is calcification in each carotid siphon and distal vertebral artery. Skull: The bony calvarium appears intact. Small bony enostosis arising from the left frontal bone. There is evidence of old trauma with remodeling involving the left mandibular condyle with osteoarthritic change in this area. Sinuses/Orbits: There is mucosal thickening in several ethmoid air cells. Other visualized paranasal sinuses are clear. Orbits appear symmetric bilaterally except for evidence of cataract removal on the left. Other: There is chronic opacification of several inferior mastoid air cells. Most mastoid air cells bilaterally are clear. IMPRESSION: 1. Atrophy with periventricular small vessel disease, stable. Chronic encephalomalacia right temporal lobe, stable. 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 No new gray-white compartment  lesion. No acute infarct. 2. No appreciable mass, hemorrhage, or extra-axial fluid collection. 3.  Foci of arterial vascular calcification evident. 4. Areas of paranasal sinus disease. Mild opacification of several inferior mastoid air cells bilaterally, stable. Electronically Signed   By: Lowella Grip III M.D.   On: 09/06/2017 12:01    Procedures Procedures (including critical care time)  Medications Ordered in ED Medications  ciprofloxacin (CIPRO) IVPB 400 mg (not administered)  levETIRAcetam (KEPPRA) 250 mg in sodium chloride 0.9 % 100 mL IVPB (not administered)  0.9 %  sodium chloride infusion (not administered)  pantoprazole (PROTONIX) injection 40 mg (not administered)  ALPRAZolam (XANAX) tablet 0.25 mg (not administered)  oxybutynin (DITROPAN-XL) 24 hr tablet 10 mg (not administered)  sodium chloride (OCEAN) 0.65 % nasal spray 1 spray (not administered)  traZODone (DESYREL) tablet 50 mg (not administered)  metoprolol tartrate (LOPRESSOR) injection 5 mg (not administered)  sodium chloride 0.9 % bolus 1,000 mL (0 mLs Intravenous Stopped 09/06/17 1432)  pantoprazole (PROTONIX) injection 40 mg (40 mg Intravenous Given 09/06/17 1402)     Initial Impression / Assessment and Plan / ED Course  I have reviewed the triage vital signs and the nursing notes.  Pertinent labs & imaging results that were available during my care of the patient were reviewed by me and considered in my medical decision making (see chart for details).     Patient presenting after being found down at her home.  She has been having dark tarry stools that are Hemoccult positive.  Her hemoglobin is stable.  She appears dehydrated on exam.  Her BUN is elevated and her lactate is mildly elevated.  Doubt acute sepsis this is more likely related to her GI bleed.  She is on Eliquis.  She has not had any active bleeding in the ER.  Her urinalysis appears contaminated cultures of both urine and blood are pending.   Patient started on IV Cipro to cover in case of UTI.  Patient admitted to hospitalist service.  Final Clinical Impressions(s) / ED Diagnoses   Final diagnoses:  Gastrointestinal hemorrhage with melena    ED Discharge Orders    None       Pixie Casino, MD 09/06/17 1620

## 2017-09-06 NOTE — H&P (Signed)
History and Physical    Kayla Kent HUD:[id number] DOB: Mar 04, 1932 DOA: 09/06/2017   PCP: Biagio Borg, MD   Attending physician: Marily Memos  Patient coming from/Resides with: Private residence/alone  Chief Complaint: Fall at home, dark stools, confusion  HPI: Kayla Kent is a 83 y.o. female with medical history significant for stage III chronic kidney disease, dyslipidemia, chronic atrial fibrillation on Eliquis, hypertension, seizure disorder on AEDs, and recent FNA of thyroid nodule.  EMS was called to the patient's home today after heating and air person discovered the patient on the floor.  Upon EMS arrival they noticed a toilet full of dark tarry stools.  Patient reportedly vomited red emesis the previous night but states she ate tomato soup.  In the ER the patient remains confused although at times can answer some questions appropriately.  BP somewhat suboptimal with systolic ranging from 858-850.  She was not orthostatic when checked.  Stools were heme positive.  BUN elevated at 31.  AST elevated at 66.  Lactic acid elevated at 2.44 and 2.05.  White count elevated at 17,600 differential not obtained.  Hemoglobin 12.7 she is consistent with her baseline.  Patient has abnormal urinalysis and previous urine culture from September revealed ESBL E. coli therefore patient placed on contact isolation.  No focal neurological deficits detected from baseline.    ED Course:  Vital Signs: BP (!) 117/52   Pulse 87   Temp 98.1 F (36.7 C) (Rectal)   Resp (!) 28   SpO2 93%  CT head without contrast: No acute infarct and no acute intracranial findings  2 view chest x-ray: Findings consistent with congestive heart failure/bilateral interstitial edema per radiologist although on clinical exam patient not hypoxic, laying flat in the bed with focal mid lung expiratory wheezing.  Bilateral lungs are elongated flattened diaphragms ? elevated left hemidiaphragm versus pleural effusion which is more  consistent with COPD Lab data: Sodium 138, potassium 3.8, chloride 102, CO2 26, glucose 106, BUN 31, creatinine 1.51, AST 66, lactic acid 2.44 and 2.05, white count 17,600, hemoglobin 12.7, platelets 185,000, urinalysis abnormal with hazy appearance, amber color, large hemoglobin, trace leukocytes, many bacteria, WBC 0-5, FOB +, blood cultures obtained in the ER Medications and treatments: Normal saline bolus times 1 L, Protonix 40 mg IV x1, Cipro 400 mg IV x1  Review of Systems:  **Unable to obtain accurate history from patient due to inconsistent history and acute confusion.  Currently the patient is telling me she has had dark stools for 3 weeks.  She had red emesis Monday night but clarify she ate tomato soup.  She also reports she takes ibuprofen daily when asked (was given the name of several pain medications) and clarified she has to do this because of chronic hip pain.  She reports difficulty swallowing at night times 1 year.   Past Medical History:  Diagnosis Date  . Allergic rhinitis   . Anxiety   . CKD (chronic kidney disease) stage 3, GFR 30-59 ml/min (HCC)   . Emphysema of lung ([place])   . HLD (hyperlipidemia)   . HTN (hypertension)   . Intolerance of drug    orthostatic  . Paroxysmal supraventricular tachycardia ([place])   . PVD (peripheral vascular disease) ([place])   . Seizure ([place])   . Syncope and collapse   . Uterine prolapse without mention of vaginal wall prolapse     Past Surgical History:  Procedure Laterality Date  . CARDIOVERSION N/A 11/05/2014   Procedure: CARDIOVERSION;  Surgeon: Candee Furbish, MD;  Location: [facility];  Service: Cardiovascular;  Laterality: N/A;  . cataract surgery  08/2015  . CHOLECYSTECTOMY    . corrective eye surgery     as a child  . INTRAMEDULLARY (IM) NAIL INTERTROCHANTERIC Left 11/21/2016   Procedure: INTRAMEDULLARY (IM) NAIL INTERTROCHANTRIC HEMI;  Surgeon: Newt Minion, MD;  Location: [facility];  Service: Orthopedics;  Laterality: Left;  .  IVD removed    . OPEN REDUCTION INTERNAL FIXATION (ORIF) DISTAL RADIAL FRACTURE Right 08/29/2014   Procedure: OPEN REDUCTION INTERNAL FIXATION (ORIF) DISTAL RADIAL FRACTURE;  Surgeon: Marianna Payment, MD;  Location: [place];  Service: Orthopedics;  Laterality: Right;  . RF ablation PSVT     summer '10  . stress cardiolite  08/05/93  . TONSILLECTOMY    . TOTAL HIP ARTHROPLASTY Right 08/29/2014   Procedure: Right Hip Hemi Arthroplasty;  Surgeon: Marianna Payment, MD;  Location: [facility];  Service: Orthopedics;  Laterality: Right;  Hip procedure 1st wants Peg Board, [employer], [place].     Social History   Socioeconomic History  . Marital status: Widowed    Spouse name: Not on file  . Number of children: 2  . Years of education: 11  . Highest education level: Not on file  Social Needs  . Financial resource strain: Not on file  . Food insecurity - worry: Not on file  . Food insecurity - inability: Not on file  . Transportation needs - medical: Not on file  . Transportation needs - non-medical: Not on file  Occupational History  . Occupation: retired Radio producer  Tobacco Use  . Smoking status: Former Smoker    Packs/day: 0.25    Years: 38.00    Pack years: 9.50    Start date: 02/15/1952    Last attempt to quit: 10/18/1993    Years since quitting: 23.9  . Smokeless tobacco: Never Used  Substance and Sexual Activity  . Alcohol use: No    Alcohol/week: 0.0 oz  . Drug use: No  . Sexual activity: No  Other Topics Concern  . Not on file  Social History Narrative   [employer], [employer]-BA, [employer]-early childhood. Married '55 -68 years.  1 son - 3; 1 daughter - 60; 3 grandchildren . Lives alone. ACP - discussed and provided packet on end of life care (Feb '13)   Patient is now widowed.   Patient is right-handed.   Patient drinks tea daily.      [facility] (04/26/17):   Originally from [facility]. Previously was a Pharmacist, hospital. No pets currently. No mold exposure. No bird  exposure.     Mobility: Independent but suspect patient will require a rolling walker Work history: Not obtained   Allergies  Allergen Reactions  . Chocolate Hives  . Codeine Other (See Comments)    Patient states she acts crazy  . Diazepam Other (See Comments)    Patient states she acts crazy.  . Fruit & Vegetable Daily [Nutritional Supplements] Hives and Swelling    peaches  . Iohexol Hives     Code: HIVES, Desc: pt gets 13 hr pre-meds, Onset Date: [id number]   . Latex Hives  . Peach [Prunus Persica] Hives  . Peanut-Containing Drug Products Hives and Swelling  . Penicillins Hives, Itching and Swelling    Has patient had a PCN reaction causing immediate rash, facial/tongue/throat swelling, SOB or lightheadedness with hypotension: Yes Has patient had a PCN reaction causing severe rash involving mucus membranes or  skin necrosis: No Has patient had a PCN reaction that required hospitalization No Has patient had a PCN reaction occurring within the last 10 years: No If all of the above answers are "NO", then may proceed with Cephalosporin use.   . Strawberry Extract Hives  . Sulfonamide Derivatives Hives    nausea  . Wheat Shortness Of Breath    Shortness of breath  . Wheat Bran Shortness Of Breath  . Sulfamethoxazole Hives  . Crestor [Rosuvastatin Calcium] Rash  . Iodine Rash    Family History  Problem Relation Age of Onset  . Mental illness Father        suicide  . Arthritis Father   . Hyperlipidemia Sister   . Hypertension Sister   . Heart disease Brother        CAD/MI  . Hypertension Brother   . Hypertension Unknown        family hx  . Colon cancer Neg Hx   . Breast cancer Neg Hx   . Diabetes Neg Hx   . Stroke Neg Hx   . Cancer Neg Hx   . Lung disease Neg Hx      Prior to Admission medications   Medication Sig Start Date End Date Taking? Authorizing Provider  ALPRAZolam Duanne Moron) 0.25 MG tablet TAKE 1 TABLET BY MOUTH TWICE DAILY AS NEEDED 07/12/17  Yes Biagio Borg, MD  amLODipine (NORVASC) 2.5 MG tablet TAKE ONE TABLET BY MOUTH ONCE DAILY 04/01/17  Yes Fay Records, MD  atorvastatin (LIPITOR) 10 MG tablet TAKE ONE TABLET BY MOUTH ONCE DAILY AT 6 PM 06/27/17  Yes Bhagat, Bhavinkumar, PA  bismuth subsalicylate (PEPTO BISMOL) 262 MG/15ML suspension Take 30 mLs by mouth every 6 (six) hours as needed for indigestion.   Yes [provider]  DILT-XR 180 MG 24 hr capsule TAKE 1 CAPSULE BY MOUTH ONCE DAILY 06/10/17  Yes Biagio Borg, MD  ELIQUIS 2.5 MG TABS tablet TAKE ONE TABLET BY MOUTH TWICE DAILY 10/08/16  Yes Fay Records, MD  levETIRAcetam (KEPPRA) 250 MG tablet Take 1 tablet (250 mg total) by mouth 2 (two) times daily. 05/09/17  Yes Dohmeier, Asencion Partridge, MD  oxybutynin (DITROPAN XL) 10 MG 24 hr tablet Take 1 tablet (10 mg total) by mouth at bedtime. 07/28/17  Yes Biagio Borg, MD  pantoprazole (PROTONIX) 40 MG tablet Take 1 tablet (40 mg total) by mouth daily. 06/28/17  Yes Biagio Borg, MD  sodium chloride (OCEAN) 0.65 % SOLN nasal spray Place 1 spray into both nostrils as needed for congestion.   Yes [provider]  traZODone (DESYREL) 50 MG tablet Take 50 mg by mouth at bedtime.  06/07/17  Yes [provider]  ciprofloxacin (CIPRO) 250 MG tablet Take 1 tablet (250 mg total) by mouth 2 (two) times daily. Patient not taking: Reported on 09/06/2017 07/12/17   Nche, Charlene Brooke, NP  ibuprofen (ADVIL,MOTRIN) 600 MG tablet Take 1 tablet (600 mg total) by mouth every 6 (six) hours as needed. Patient not taking: Reported on 09/06/2017 06/27/17   Fatima Blank, MD  methocarbamol (ROBAXIN) 500 MG tablet Take 1 tablet (500 mg total) by mouth at bedtime as needed for muscle spasms. Patient not taking: Reported on 09/06/2017 07/18/17   Biagio Borg, MD  traMADol (ULTRAM) 50 MG tablet Take 1-2 tablets (50-100 mg total) by mouth every 6 (six) hours as needed for moderate pain or severe pain. Patient not taking: Reported on  09/06/2017 11/25/16  Rama, Venetia Maxon, MD    Physical Exam: Vitals:   09/06/17 1500 09/06/17 1515 09/06/17 1600 09/06/17 1615  BP: (!) 105/49 128/70 (!) 111/43 (!) 117/52  Pulse: 92 85 98 87  Resp: _0 (!) 28  Temp:      TempSrc:      SpO2: 97% 92% 96% 93%      Constitutional: NAD, calm, comfortable Eyes: PERRL, lids and conjunctivae normal, bilateral mild scleral injection ENMT: Mucous membranes are dry. Posterior pharynx clear of any exudate or lesions.poor dentition.  Neck: normal, supple, no masses, no overt thyromegaly Respiratory: Bilateral expiratory wheezes mid fields. Normal respiratory effort out accessory muscle use at rest. RA Cardiovascular: Irregular rhythm/atrial fibrillation, no murmurs / rubs / gallops. No extremity edema. 2+ pedal pulses. No carotid bruits.  Abdomen: no tenderness, no masses palpated. No hepatosplenomegaly. Bowel sounds positive.  Musculoskeletal: no clubbing / cyanosis. No joint deformity upper and lower extremities. Good ROM, no contractures. Normal muscle tone.  Skin: no rashes, lesions, ulcers. No induration Neurologic: CN 2-12 grossly intact-chronic right eye deviation. Sensation intact, DTR normal. Strength 5/5 x all 4 extremities.  Psychiatric: Alert and oriented x name only. Normal mood.    Labs on Admission: I have personally reviewed following labs and imaging studies  CBC: Recent Labs  Lab 09/06/17 1102  WBC 17.6*  HGB 12.7  HCT 38.8  MCV 93.9  PLT 956   Basic Metabolic Panel: Recent Labs  Lab 09/06/17 1102  NA 138  K 3.8  CL 102  CO2 26  GLUCOSE 106*  BUN 31*  CREATININE 1.51*  CALCIUM 8.4*   GFR: CrCl cannot be calculated (Unknown ideal weight.). Liver Function Tests: Recent Labs  Lab 09/06/17 1102  AST 66*  ALT 34  ALKPHOS 105  BILITOT 1.2  PROT 5.1*  ALBUMIN 2.6*   No results for input(s): LIPASE, AMYLASE in the last 168 hours. No results for input(s): AMMONIA in the last 168 hours. Coagulation  Profile: No results for input(s): INR, PROTIME in the last 168 hours. Cardiac Enzymes: No results for input(s): CKTOTAL, CKMB, CKMBINDEX, TROPONINI in the last 168 hours. BNP (last 3 results) Recent Labs    10/22/16 1135  PROBNP 410.0*   HbA1C: No results for input(s): HGBA1C in the last 72 hours. CBG: No results for input(s): GLUCAP in the last 168 hours. Lipid Profile: No results for input(s): CHOL, HDL, LDLCALC, TRIG, CHOLHDL, LDLDIRECT in the last 72 hours. Thyroid Function Tests: No results for input(s): TSH, T4TOTAL, FREET4, T3FREE, THYROIDAB in the last 72 hours. Anemia Panel: No results for input(s): VITAMINB12, FOLATE, FERRITIN, TIBC, IRON, RETICCTPCT in the last 72 hours. Urine analysis:    Component Value Date/Time   COLORURINE AMBER (A) 09/06/2017 1427   APPEARANCEUR HAZY (A) 09/06/2017 1427   APPEARANCEUR Clear 07/22/2017 1022   LABSPEC 1.020 09/06/2017 1427   PHURINE 5.0 09/06/2017 1427   GLUCOSEU NEGATIVE 09/06/2017 1427   GLUCOSEU NEGATIVE 04/13/2017 0839   HGBUR LARGE (A) 09/06/2017 1427   BILIRUBINUR NEGATIVE 09/06/2017 1427   BILIRUBINUR Negative 07/22/2017 1022   KETONESUR NEGATIVE 09/06/2017 1427   PROTEINUR NEGATIVE 09/06/2017 1427   UROBILINOGEN 0.2 07/04/2017 0930   UROBILINOGEN 0.2 04/13/2017 0839   NITRITE NEGATIVE 09/06/2017 1427   LEUKOCYTESUR TRACE (A) 09/06/2017 1427   LEUKOCYTESUR Negative 07/22/2017 1022   Sepsis Labs: _1 (procalcitonin:4,lacticidven:4) )No results found for this or any previous visit (from the past 240 hour(s)).   Radiological Exams on Admission: Dg Chest 2 View  Result Date: 09/06/2017 CLINICAL DATA:  Dizziness.  Vomiting.  Third mental status . EXAM: CHEST  2 VIEW COMPARISON:  CT 08/23/2017. PET-CT 05/25/2017. Chest x-ray 04/14/2017. FINDINGS: Cardiomegaly with increase interstitial prominence bilaterally, particular on the left. Small bilateral pleural effusions cannot be excluded. Findings suggest CHF.  Bilateral pneumonia cannot be excluded. Reference is made to prior PET-CT report of 05/25/2017. IMPRESSION: Findings consistent with congestive heart failure with bilateral from interstitial edema. Small bilateral pleural effusions cannot be excluded. Electronically Signed   By: Marcello Moores  Register   On: 09/06/2017 13:36   Ct Head Wo Contrast  Result Date: 09/06/2017 CLINICAL DATA:  Altered mental status with questionable fall. Slurred speech. EXAM: CT HEAD WITHOUT CONTRAST TECHNIQUE: Contiguous axial images were obtained from the base of the skull through the vertex without intravenous contrast. COMPARISON:  April 06, 2017 head CT; brain MRI September 23, 2014 FINDINGS: Brain: There is mild diffuse atrophy. There is no intracranial mass, hemorrhage, extra-axial fluid collection, or midline shift. There is chronic right temporal lobe encephalomalacia, stable. There is a prior infarct involving the head of the caudate nucleus on the right. There is patchy small vessel disease in the centra semiovale bilaterally, stable. No acute infarct demonstrable on this study. Vascular: No hyperdense vessel. There is calcification in each carotid siphon and distal vertebral artery. Skull: The bony calvarium appears intact. Small bony enostosis arising from the left frontal bone. There is evidence of old trauma with remodeling involving the left mandibular condyle with osteoarthritic change in this area. Sinuses/Orbits: There is mucosal thickening in several ethmoid air cells. Other visualized paranasal sinuses are clear. Orbits appear symmetric bilaterally except for evidence of cataract removal on the left. Other: There is chronic opacification of several inferior mastoid air cells. Most mastoid air cells bilaterally are clear. IMPRESSION: 1. Atrophy with periventricular small vessel disease, stable. Chronic encephalomalacia right temporal lobe, stable. 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 No new gray-white  compartment lesion. No acute infarct. 2. No appreciable mass, hemorrhage, or extra-axial fluid collection. 3.  Foci of arterial vascular calcification evident. 4. Areas of paranasal sinus disease. Mild opacification of several inferior mastoid air cells bilaterally, stable. Electronically Signed   By: Lowella Grip III M.D.   On: 09/06/2017 12:01    EKG: (Independently reviewed) atrial fibrillation with ventricular rate 88 bpm, QTC 533 ms, unifocal PVC, voltage criteria met for LVH, no acute ischemic changes  Assessment/Plan Principal Problem:   Acute upper GI bleed -Presents to ER after being found down in her bathroom with a toilet full of dark stool, possible daily NSAID use in the context of Eliquis for anticoagulation, stools are heme positive -GI consulted -Currently hemoglobin stable but will continue to check serial readings with hydration-repeat at 5 PM and if remains stable repeat again in a.m. -Type and screen has been completed in the event patient needs transfusion -Discussed with GI and will allow clear liquids but will make NPO at midnight for possible endoscopy -Protonix 40 mg IV every 12 hours -Hold Eliquis -History of reflux and patient is reporting difficulty swallowing especially at night for at least one year  Active Problems:   Abnormal urinalysis -Patient has abnormal urinalysis that could be consistent with UTI and she is also reporting dysuria and urinary frequency -In September had ESBL E. coli sensitive to Cipro so we will continue Cipro and initiate contact isolation -Follow-up on blood and urine cultures -Patient does have mildly elevated lactate but suspect this is secondary  to dehydration and GI bleeding as opposed to sepsis since otherwise sepsis physiology criteria not met-repeat lactic acid    HTN (hypertension) -Current blood pressure readings suboptimal in the context of acute GI bleeding -Hold preadmission Norvasc and diltiazem    Chronic atrial  fibrillation  -Currently rate controlled-on CCB at home -Given suboptimal blood pressure readings and GI bleeding we will utilize IV Lopressor 5 mg scheduled every 6 hours with hold parameters -Eliquis on hold secondary to GI bleeding -CHADS2 Score for Stroke Risk in Atrial Fibrillation History of CHF: No (0 points) History of hypertension: Yes (1 point) Age greater than or equal to 75 years: Yes (1 point) History of diabetes mellitus: No (0 points) Previous stroke symptoms or TIA: No (0 points) Score: 2. Thromboembolic event intermediate risk. Risk of event is 4.0% per year if no coumadin. -Telemetry monitoring -If unable to resume Eliquis 2/2 GI bleeding source likely will need to consult cardiology for their input    Confusion -Review of outpatient documentation demonstrates patient has been having some problems with memory lapses for at least one year -Acute changes could be related to possible infection versus GI bleeding symptomatology -Patient lives alone and it appears family is moving out of town therefore patient may need to be placed at a facility is appropriate for discharge -Recommend obtain PT/OT evaluation once GI bleeding resolved -Continue preadmission Xanax for now    CKD (chronic kidney disease), stage III  -Patient has elevated BUN in the context of presumed upper GI bleeding -GFR slightly lower than baseline of around 47 (currently 31) with creatinine slightly elevated at 1.51 with baseline 1.07 -IV fluid hydration at 100/hr -Follow labs -AST also acutely elevated and likely related to decreased perfusion from GI bleeding/dehydration    Seizure disorder  -Home Keppra-IV for now -Patient did fall at home in the context of presumed GI bleeding; she may have had a seizure -Continue to monitor for possible acute seizure activity -Check levetiracetam level    Thyroid nodule -Recent FNA of nodule with indeterminate pathology-ordered by PCP and should follow-up with  PCP regarding significance of findings    Dyslipidemia -Hold preadmission statin L diet advanced      DVT prophylaxis: SCDs Code Status: Full Family Communication: Son-in-law Disposition Plan: TBD-anticipate will need to discharge to SNF Consults called: GI/[place] on call physician    ELLIS,ALLISON L. ANP-BC [facility] Pager [phone]   If 7PM-7AM, please contact night-coverage [email or web address] Password [email or web address]  09/06/2017, 4:40 PM

## 2017-09-06 NOTE — ED Notes (Addendum)
Pt son called and requesting updates. He lives in [place].  Kayla Kent [phone] EDP made aware

## 2017-09-06 NOTE — ED Triage Notes (Signed)
Pt arrives via [facility] from home, pts heating and air man came and found pt on the floor with a toilet full of dark tarry stools, pt reports she slid off of her bed, also reports episode of bright red emesis last night. Pt does take eloquis for hx of afib, initially was confused, now a/ox4. Pt has slurred speech which is new per pts neighbor, LSN is unknown since pt lives alone. Has Right eye deviation at baseline. Ems stroke screen negative. cbg 137

## 2017-09-06 NOTE — Consult Note (Addendum)
   [facility] CM Inpatient Consult   09/06/2017  Kayla Kent 08-19-1932 [id number]   Received telephone call from [facility] indicating patient is currently in the ED. Please see chart review then encounters for further patient outreach details.  Made aware that [name] lives alone and was found down at home by the Set designer. Made aware that [name] would likely need placement if qualifies.  According to [facility], patient does have a daughter that lives locally by the name of [name] at [phone]; Her son [name] lives in [place] and his number is [phone].   Will continue to follow.  Call made to ED RNCM to make aware of all of the above notes.  Also discussed with ED RNCM about possibility of [facility] [place] program if patient is agreeable to people coming out to her home.   Marthenia Rolling, [name], RN,BSN [facility] Liaison [phone]

## 2017-09-06 NOTE — Progress Notes (Signed)
CRITICAL VALUE ALERT  Critical Value:  Lactic Acid 3.7  Date & Time Notied:  09/06/18 @ 2025   Provider Notified: Tylene Fantasia   Orders Received/Actions taken: 500ML NS Bolus

## 2017-09-07 ENCOUNTER — Inpatient Hospital Stay (HOSPITAL_COMMUNITY): Payer: Medicare Other

## 2017-09-07 ENCOUNTER — Encounter (HOSPITAL_COMMUNITY): Payer: Self-pay | Admitting: *Deleted

## 2017-09-07 DIAGNOSIS — I959 Hypotension, unspecified: Secondary | ICD-10-CM

## 2017-09-07 DIAGNOSIS — I482 Chronic atrial fibrillation: Secondary | ICD-10-CM

## 2017-09-07 DIAGNOSIS — K922 Gastrointestinal hemorrhage, unspecified: Secondary | ICD-10-CM

## 2017-09-07 DIAGNOSIS — R195 Other fecal abnormalities: Secondary | ICD-10-CM

## 2017-09-07 DIAGNOSIS — E041 Nontoxic single thyroid nodule: Secondary | ICD-10-CM

## 2017-09-07 DIAGNOSIS — A419 Sepsis, unspecified organism: Secondary | ICD-10-CM

## 2017-09-07 DIAGNOSIS — I480 Paroxysmal atrial fibrillation: Secondary | ICD-10-CM

## 2017-09-07 DIAGNOSIS — K921 Melena: Principal | ICD-10-CM

## 2017-09-07 DIAGNOSIS — E785 Hyperlipidemia, unspecified: Secondary | ICD-10-CM

## 2017-09-07 DIAGNOSIS — D649 Anemia, unspecified: Secondary | ICD-10-CM

## 2017-09-07 LAB — CBC
HCT: 33.2 % — ABNORMAL LOW (ref 36.0–46.0)
Hemoglobin: 11.1 g/dL — ABNORMAL LOW (ref 12.0–15.0)
MCH: 31.5 pg (ref 26.0–34.0)
MCHC: 33.4 g/dL (ref 30.0–36.0)
MCV: 94.3 fL (ref 78.0–100.0)
PLATELETS: 173 10*3/uL (ref 150–400)
RBC: 3.52 MIL/uL — ABNORMAL LOW (ref 3.87–5.11)
RDW: 15.5 % (ref 11.5–15.5)
WBC: 24.9 10*3/uL — ABNORMAL HIGH (ref 4.0–10.5)

## 2017-09-07 LAB — COMPREHENSIVE METABOLIC PANEL
ALK PHOS: 86 U/L (ref 38–126)
ALT: 48 U/L (ref 14–54)
ANION GAP: 7 (ref 5–15)
AST: 110 U/L — ABNORMAL HIGH (ref 15–41)
Albumin: 2.2 g/dL — ABNORMAL LOW (ref 3.5–5.0)
BUN: 22 mg/dL — ABNORMAL HIGH (ref 6–20)
CALCIUM: 7.8 mg/dL — AB (ref 8.9–10.3)
CHLORIDE: 109 mmol/L (ref 101–111)
CO2: 23 mmol/L (ref 22–32)
Creatinine, Ser: 1.07 mg/dL — ABNORMAL HIGH (ref 0.44–1.00)
GFR, EST AFRICAN AMERICAN: 54 mL/min — AB (ref >60)
GFR, EST NON AFRICAN AMERICAN: 47 mL/min — AB (ref >60)
Glucose, Bld: 65 mg/dL (ref 65–99)
Potassium: 4 mmol/L (ref 3.5–5.1)
SODIUM: 139 mmol/L (ref 135–145)
Total Bilirubin: 1.4 mg/dL — ABNORMAL HIGH (ref 0.3–1.2)
Total Protein: 4.7 g/dL — ABNORMAL LOW (ref 6.5–8.1)

## 2017-09-07 LAB — BASIC METABOLIC PANEL
ANION GAP: 11 (ref 5–15)
BUN: 22 mg/dL — ABNORMAL HIGH (ref 6–20)
CALCIUM: 8 mg/dL — AB (ref 8.9–10.3)
CHLORIDE: 111 mmol/L (ref 101–111)
CO2: 18 mmol/L — AB (ref 22–32)
Creatinine, Ser: 1.33 mg/dL — ABNORMAL HIGH (ref 0.44–1.00)
GFR calc Af Amer: 42 mL/min — ABNORMAL LOW (ref >60)
GFR calc non Af Amer: 36 mL/min — ABNORMAL LOW (ref >60)
GLUCOSE: 84 mg/dL (ref 65–99)
POTASSIUM: 4.6 mmol/L (ref 3.5–5.1)
Sodium: 140 mmol/L (ref 135–145)

## 2017-09-07 LAB — LACTIC ACID, PLASMA: LACTIC ACID, VENOUS: 1.7 mmol/L (ref 0.5–1.9)

## 2017-09-07 LAB — MAGNESIUM: Magnesium: 1.7 mg/dL (ref 1.7–2.4)

## 2017-09-07 MED ORDER — MEROPENEM 1 G IV SOLR
1.0000 g | Freq: Two times a day (BID) | INTRAVENOUS | Status: DC
  Administered 2017-09-07 – 2017-09-14 (×14): 1 g via INTRAVENOUS
  Filled 2017-09-07 (×14): qty 1

## 2017-09-07 MED ORDER — PEG-KCL-NACL-NASULF-NA ASC-C 100 G PO SOLR: 0.5000 | Freq: Once | ORAL | Status: DC

## 2017-09-07 MED ORDER — DILTIAZEM HCL 25 MG/5ML IV SOLN
10.0000 mg | INTRAVENOUS | Status: DC
  Filled 2017-09-07: qty 5

## 2017-09-07 MED ORDER — PEG-KCL-NACL-NASULF-NA ASC-C 100 G PO SOLR: 1.0000 | Freq: Once | ORAL | Status: DC

## 2017-09-07 MED ORDER — BISACODYL 5 MG PO TBEC
5.0000 mg | DELAYED_RELEASE_TABLET | Freq: Every day | ORAL | Status: AC
  Administered 2017-09-07 – 2017-09-08 (×2): 5 mg via ORAL
  Filled 2017-09-07 (×2): qty 1

## 2017-09-07 MED ORDER — METOPROLOL TARTRATE 5 MG/5ML IV SOLN
2.5000 mg | Freq: Four times a day (QID) | INTRAVENOUS | Status: DC
  Administered 2017-09-07 (×2): 2.5 mg via INTRAVENOUS
  Filled 2017-09-07: qty 5

## 2017-09-07 MED ORDER — MAGNESIUM SULFATE 2 GM/50ML IV SOLN
2.0000 g | Freq: Once | INTRAVENOUS | Status: AC
  Administered 2017-09-07: 2 g via INTRAVENOUS
  Filled 2017-09-07: qty 50

## 2017-09-07 MED ORDER — METOCLOPRAMIDE HCL 5 MG/ML IJ SOLN
10.0000 mg | Freq: Once | INTRAMUSCULAR | Status: AC
  Administered 2017-09-09: 10 mg via INTRAVENOUS
  Filled 2017-09-07: qty 2

## 2017-09-07 MED ORDER — METOPROLOL TARTRATE 5 MG/5ML IV SOLN: 2.5000 mg | Freq: Four times a day (QID) | INTRAVENOUS | Status: DC

## 2017-09-07 MED ORDER — PEG-KCL-NACL-NASULF-NA ASC-C 100 G PO SOLR
0.5000 | Freq: Once | ORAL | Status: DC
  Filled 2017-09-07: qty 1

## 2017-09-07 MED ORDER — METOCLOPRAMIDE HCL 5 MG/ML IJ SOLN
10.0000 mg | Freq: Once | INTRAMUSCULAR | Status: AC
  Administered 2017-09-08: 10 mg via INTRAVENOUS
  Filled 2017-09-07: qty 2

## 2017-09-07 MED ORDER — DILTIAZEM LOAD VIA INFUSION
10.0000 mg | Freq: Once | INTRAVENOUS | Status: AC
  Administered 2017-09-07: 10 mg via INTRAVENOUS
  Filled 2017-09-07: qty 10

## 2017-09-07 MED ORDER — AMIODARONE HCL IN DEXTROSE 360-4.14 MG/200ML-% IV SOLN
30.0000 mg/h | INTRAVENOUS | Status: DC
  Administered 2017-09-08 – 2017-09-09 (×4): 30 mg/h via INTRAVENOUS
  Filled 2017-09-07 (×3): qty 200

## 2017-09-07 MED ORDER — DILTIAZEM HCL 100 MG IV SOLR
5.0000 mg/h | INTRAVENOUS | Status: DC
  Administered 2017-09-07: 5 mg/h via INTRAVENOUS
  Filled 2017-09-07 (×2): qty 100

## 2017-09-07 MED ORDER — FLUCONAZOLE IN SODIUM CHLORIDE 200-0.9 MG/100ML-% IV SOLN
150.0000 mg | Freq: Once | INTRAVENOUS | Status: AC
  Administered 2017-09-07: 150 mg via INTRAVENOUS
  Filled 2017-09-07: qty 75

## 2017-09-07 MED ORDER — FUROSEMIDE 10 MG/ML IJ SOLN
40.0000 mg | Freq: Once | INTRAMUSCULAR | Status: AC
  Administered 2017-09-07: 40 mg via INTRAVENOUS
  Filled 2017-09-07: qty 4

## 2017-09-07 MED ORDER — VANCOMYCIN HCL IN DEXTROSE 1-5 GM/200ML-% IV SOLN
1000.0000 mg | Freq: Once | INTRAVENOUS | Status: AC
  Administered 2017-09-07: 1000 mg via INTRAVENOUS
  Filled 2017-09-07: qty 200

## 2017-09-07 MED ORDER — AMIODARONE HCL IN DEXTROSE 360-4.14 MG/200ML-% IV SOLN
60.0000 mg/h | INTRAVENOUS | Status: AC
  Administered 2017-09-07: 60 mg/h via INTRAVENOUS
  Filled 2017-09-07 (×2): qty 200

## 2017-09-07 MED ORDER — AMIODARONE LOAD VIA INFUSION
150.0000 mg | Freq: Once | INTRAVENOUS | Status: AC
  Administered 2017-09-07: 150 mg via INTRAVENOUS
  Filled 2017-09-07: qty 83.34

## 2017-09-07 MED ORDER — VANCOMYCIN HCL 500 MG IV SOLR
500.0000 mg | INTRAVENOUS | Status: DC
  Administered 2017-09-08 – 2017-09-10 (×3): 500 mg via INTRAVENOUS
  Filled 2017-09-07 (×4): qty 500

## 2017-09-07 NOTE — Consult Note (Signed)
   [facility] CM Inpatient Consult   09/07/2017  Kayla Kent December 06, 1931 [id number]    [facility] Care Management follow up.  Chart reviewed. Patient currently in stepdown unit. Has GI bleed, UTI, HTN, AFIB, CKD.  Telephone call made to inpatient RNCM to make aware that [facility] has been following.  Will continue to follow and make referral to [facility] LCSW once disposition is known as patient could benefit from SNF placement.   Marthenia Rolling, [name], RN,BSN [facility] Liaison [phone]

## 2017-09-07 NOTE — Progress Notes (Signed)
Pharmacy Antibiotic Note  Kayla Kent is a 83 y.o. female admitted on 09/06/2017 with sepsis.  Pharmacy has been consulted for vancomycin and meropenem dosing.  SCr went from ~1 to 1.3 in 12h today. History of ESBL UTI.   Plan: Meropenem 1g IV q12h Vancomycin 1g IV x1, then 500mg  IV q24h Follow c/s, clinical progression, de-esc/LOT, renal function, level PRN  Weight: 117 lb 1 oz (53.1 kg)  Temp (24hrs), Avg:99.3 F (37.4 C), Min:98 F (36.7 C), Max:102.2 F (39 C)  Recent Labs  Lab 09/06/17 1102 09/06/17 1230 09/06/17 1413 09/06/17 1649 09/06/17 1911 09/07/17 0347 09/07/17 1620  WBC 17.6*  --   --  22.1*  --  24.9*  --   CREATININE 1.51*  --   --   --   --  1.07* 1.33*  LATICACIDVEN  --  2.44* 2.05*  --  3.7* 1.7  --     Estimated Creatinine Clearance: 26.9 mL/min (A) (by C-G formula based on SCr of 1.33 mg/dL (H)).    Allergies  Allergen Reactions  . Chocolate Hives  . Codeine Other (See Comments)    Patient states she acts crazy  . Diazepam Other (See Comments)    Patient states she acts crazy.  . Fruit & Vegetable Daily [Nutritional Supplements] Hives and Swelling    peaches  . Iohexol Hives     Code: HIVES, Desc: pt gets 13 hr pre-meds, Onset Date: [id number]   . Latex Hives  . Peach [Prunus Persica] Hives  . Peanut-Containing Drug Products Hives and Swelling  . Penicillins Hives, Itching and Swelling    Has patient had a PCN reaction causing immediate rash, facial/tongue/throat swelling, SOB or lightheadedness with hypotension: Yes Has patient had a PCN reaction causing severe rash involving mucus membranes or skin necrosis: No Has patient had a PCN reaction that required hospitalization No Has patient had a PCN reaction occurring within the last 10 years: No If all of the above answers are "NO", then may proceed with Cephalosporin use.   . Strawberry Extract Hives  . Sulfonamide Derivatives Hives    nausea  . Wheat Shortness Of Breath    Shortness of  breath  . Wheat Bran Shortness Of Breath  . Sulfamethoxazole Hives  . Crestor [Rosuvastatin Calcium] Rash  . Iodine Rash    Cipro 11/20>>11/21 Meropenem 11/21>> Vancomycin 11/21>>  11/20 Blood: ngtd 11/20 Urine: >100K kleb pneumo 11/20 MRSA PCR: neg   Thank you for allowing pharmacy to be a part of this patient's care.  Saamir Armstrong D. Noel Henandez, PharmD, BCPS Clinical Pharmacist [phone] 09/07/2017 5:23 PM
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
## 2017-09-07 NOTE — Progress Notes (Signed)
PROGRESS NOTE    Kayla Kent  IYM:[id number] DOB: Dec 27, 1931 DOA: 09/06/2017 PCP: Kayla Borg, MD   Chief Complaint  Patient presents with  . Fall  . GI Bleeding    Brief Narrative:  HPI On 09/06/2017 by Ms. Kayla Hail, NP Kayla Kent is a 83 y.o. female with medical history significant for stage III chronic kidney disease, dyslipidemia, chronic atrial fibrillation on Eliquis, hypertension, seizure disorder on AEDs, and recent FNA of thyroid nodule.  EMS was called to the patient's home today after heating and air person discovered the patient on the floor.  Upon EMS arrival they noticed a toilet full of dark tarry stools.  Patient reportedly vomited red emesis the previous night but states she ate tomato soup.  In the ER the patient remains confused although at times can answer some questions appropriately.  BP somewhat suboptimal with systolic ranging from 768-088.  She was not orthostatic when checked.  Stools were heme positive.  BUN elevated at 31.  AST elevated at 66.  Lactic acid elevated at 2.44 and 2.05.  White count elevated at 17,600 differential not obtained.  Hemoglobin 12.7 she is consistent with her baseline.  Patient has abnormal urinalysis and previous urine culture from September revealed ESBL E. coli therefore patient placed on contact isolation.  No focal neurological deficits detected from baseline.  Assessment & Plan   Acute symptomatic GI bleed, likely upper -Presented to the ER after being found down in her bathroom, toilet full of dark stool -Patient was taking daily NSAIDs, and the context of Eliquis for anticoagulation -FOBT + -Hemoglobin currently stable, 11.1 -Gastroenterology consulted and appreciated- placed on full liquids today. EGD and colonoscopy on 09/09/2017 at noon. Clear liquids to start on 09/08/2017 along with split bowel prep -Continue Protonix IV twice a day -Continue to monitor CBC  UTI -UA: Many bacteria, 0-5 WBC, trace  leukocytes -Complains of dysuria and urinary frequency -In September 2018, patient had ESBL Escherichia coli, which was sensitive to Cipro -Blood cultures pending -Urine culture pending  Essential hypertension -Amlodipine, diltiazem held in the setting of GI bleed and risk of hypotension -Continue to monitor  Chronic atrial fibrillation with periods of RVR -CHADSVASC 4 (based on gender, age, hypertension) -Eliquis held due to GI bleed -Continue telemetry monitoring -Continue IV metoprolol 2.5 mg every 6 hours with holding parameters  Chronic kidney disease, stage III -Creatinine on admission 1.51 (however GFR within stage III), appears that baseline is proximal 1.07-1.08 -Currently creatinine 1.07, continue to monitor BMP  Confusion/dementia? -Per outpatient documentation, patient has had some memory lapses for the past 1 year -Acute changes possibly due to GI bleeding versus infection -CT head showed atrophy with periventricular small vessel disease. Chronic encephalomalacia of the right temporal lobe. No acute infarct. 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 -Patient currently lives alone -Education officer, museum consulted -PT and OT will need to evaluate patient  Seizure disorder -Continue Keppra -Keppra level pending  Thyroid nodule -Recent FNA of nodule within permanent pathology -Patient should follow-up with her primary care physician regarding this  Dyslipidemia -Statin held  DVT Prophylaxis  SCDs  Code Status: Full  Family Communication: None at bedside  Disposition Plan: Admitted, pending further GI workup  Consultants Gastroenterology  Procedures  None  Antibiotics   Anti-infectives (From admission, onward)   Start     Dose/Rate Route Frequency Ordered Stop   09/06/17 1515  ciprofloxacin (CIPRO) IVPB 400 mg  Status:  Discontinued  400 mg 200 mL/hr over 60 Minutes Intravenous  Once 09/06/17 1514 09/06/17 1753   09/06/17 0400  ciprofloxacin  (CIPRO) IVPB 400 mg     400 mg 200 mL/hr over 60 Minutes Intravenous Every 12 hours 09/06/17 1623        Subjective:   Kayla Kent seen and examined today. Denies chest pain, shortness of breath, abdominal pain, nausea, vomiting, dizziness, headache. Does appear to have some confusion. She states she is here for a breast biopsy.  Objective:   Vitals:   09/07/17 0500 09/07/17 0801 09/07/17 0821 09/07/17 1141  BP:  (!) 88/53  (!) 164/76  Pulse:  (!) 112  (!) 118  Resp:  18  (!) 26  Temp:  99.6 F (37.6 C)  98 F (36.7 C)  TempSrc:  Oral  Oral  SpO2:  95% 94% 92%  Weight: 53.1 kg (117 lb 1 oz)       Intake/Output Summary (Last 24 hours) at 09/07/2017 1239 Last data filed at 09/07/2017 0700 Gross per 24 hour  Intake 2891.67 ml  Output 500 ml  Net 2391.67 ml   Filed Weights   09/07/17 0500  Weight: 53.1 kg (117 lb 1 oz)    Exam  General: Well developed, well nourished, NAD, appears stated age  27: NCAT, mucous membranes moist.   Cardiovascular: S1 S2 auscultated, irregularly irregluar  Respiratory: Clear to auscultation bilaterally with equal chest rise  Abdomen: Soft, nontender, nondistended, + bowel sounds  Extremities: warm dry without cyanosis clubbing or edema  Neuro: AAOx2 (self and place), nonfocal  Psych: Normal affect and demeanor   Data Reviewed: I have personally reviewed following labs and imaging studies  CBC: Recent Labs  Lab 09/06/17 1102 09/06/17 1649 09/07/17 0347  WBC 17.6* 22.1* 24.9*  HGB 12.7 11.7* 11.1*  HCT 38.8 35.7* 33.2*  MCV 93.9 94.4 94.3  PLT 185 170 494   Basic Metabolic Panel: Recent Labs  Lab 09/06/17 1102 09/06/17 1649 09/07/17 0347  NA 138  --  139  K 3.8  --  4.0  CL 102  --  109  CO2 26  --  23  GLUCOSE 106*  --  65  BUN 31*  --  22*  CREATININE 1.51*  --  1.07*  CALCIUM 8.4*  --  7.8*  MG  --  1.4*  --    GFR: Estimated Creatinine Clearance: 33.4 mL/min (A) (by C-G formula based on SCr of 1.07  mg/dL (H)). Liver Function Tests: Recent Labs  Lab 09/06/17 1102 09/07/17 0347  AST 66* 110*  ALT 34 48  ALKPHOS 105 86  BILITOT 1.2 1.4*  PROT 5.1* 4.7*  ALBUMIN 2.6* 2.2*   No results for input(s): LIPASE, AMYLASE in the last 168 hours. No results for input(s): AMMONIA in the last 168 hours. Coagulation Profile: No results for input(s): INR, PROTIME in the last 168 hours. Cardiac Enzymes: No results for input(s): CKTOTAL, CKMB, CKMBINDEX, TROPONINI in the last 168 hours. BNP (last 3 results) Recent Labs    10/22/16 1135  PROBNP 410.0*   HbA1C: No results for input(s): HGBA1C in the last 72 hours. CBG: No results for input(s): GLUCAP in the last 168 hours. Lipid Profile: No results for input(s): CHOL, HDL, LDLCALC, TRIG, CHOLHDL, LDLDIRECT in the last 72 hours. Thyroid Function Tests: No results for input(s): TSH, T4TOTAL, FREET4, T3FREE, THYROIDAB in the last 72 hours. Anemia Panel: No results for input(s): VITAMINB12, FOLATE, FERRITIN, TIBC, IRON, RETICCTPCT in the last 72 hours.  Urine analysis:    Component Value Date/Time   COLORURINE AMBER (A) 09/06/2017 1427   APPEARANCEUR HAZY (A) 09/06/2017 1427   APPEARANCEUR Clear 07/22/2017 1022   LABSPEC 1.020 09/06/2017 1427   PHURINE 5.0 09/06/2017 1427   GLUCOSEU NEGATIVE 09/06/2017 1427   GLUCOSEU NEGATIVE 04/13/2017 0839   HGBUR LARGE (A) 09/06/2017 1427   BILIRUBINUR NEGATIVE 09/06/2017 1427   BILIRUBINUR Negative 07/22/2017 1022   KETONESUR NEGATIVE 09/06/2017 1427   PROTEINUR NEGATIVE 09/06/2017 1427   UROBILINOGEN 0.2 07/04/2017 0930   UROBILINOGEN 0.2 04/13/2017 0839   NITRITE NEGATIVE 09/06/2017 1427   LEUKOCYTESUR TRACE (A) 09/06/2017 1427   LEUKOCYTESUR Negative 07/22/2017 1022   Sepsis Labs: @LABRCNTIP (procalcitonin:4,lacticidven:4)  ) Recent Results (from the past 240 hour(s))  MRSA PCR Screening     Status: None   Collection Time: 09/06/17  5:15 PM  Result Value Ref Range Status   MRSA by  PCR NEGATIVE NEGATIVE Final    Comment:        The GeneXpert MRSA Assay (FDA approved for NASAL specimens only), is one component of a comprehensive MRSA colonization surveillance program. It is not intended to diagnose MRSA infection nor to guide or monitor treatment for MRSA infections.       Radiology Studies: Dg Chest 2 View  Result Date: 09/06/2017 CLINICAL DATA:  Dizziness.  Vomiting.  Third mental status . EXAM: CHEST  2 VIEW COMPARISON:  CT 08/23/2017. PET-CT 05/25/2017. Chest x-ray 04/14/2017. FINDINGS: Cardiomegaly with increase interstitial prominence bilaterally, particular on the left. Small bilateral pleural effusions cannot be excluded. Findings suggest CHF. Bilateral pneumonia cannot be excluded. Reference is made to prior PET-CT report of 05/25/2017. IMPRESSION: Findings consistent with congestive heart failure with bilateral from interstitial edema. Small bilateral pleural effusions cannot be excluded. Electronically Signed   By: Marcello Moores  Register   On: 09/06/2017 13:36   Ct Head Wo Contrast  Result Date: 09/06/2017 CLINICAL DATA:  Altered mental status with questionable fall. Slurred speech. EXAM: CT HEAD WITHOUT CONTRAST TECHNIQUE: Contiguous axial images were obtained from the base of the skull through the vertex without intravenous contrast. COMPARISON:  April 06, 2017 head CT; brain MRI September 23, 2014 FINDINGS: Brain: There is mild diffuse atrophy. There is no intracranial mass, hemorrhage, extra-axial fluid collection, or midline shift. There is chronic right temporal lobe encephalomalacia, stable. There is a prior infarct involving the head of the caudate nucleus on the right. There is patchy small vessel disease in the centra semiovale bilaterally, stable. No acute infarct demonstrable on this study. Vascular: No hyperdense vessel. There is calcification in each carotid siphon and distal vertebral artery. Skull: The bony calvarium appears intact. Small bony enostosis  arising from the left frontal bone. There is evidence of old trauma with remodeling involving the left mandibular condyle with osteoarthritic change in this area. Sinuses/Orbits: There is mucosal thickening in several ethmoid air cells. Other visualized paranasal sinuses are clear. Orbits appear symmetric bilaterally except for evidence of cataract removal on the left. Other: There is chronic opacification of several inferior mastoid air cells. Most mastoid air cells bilaterally are clear. IMPRESSION: 1. Atrophy with periventricular small vessel disease, stable. Chronic encephalomalacia right temporal lobe, stable. 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 No new gray-white compartment lesion. No acute infarct. 2. No appreciable mass, hemorrhage, or extra-axial fluid collection. 3.  Foci of arterial vascular calcification evident. 4. Areas of paranasal sinus disease. Mild opacification of several inferior mastoid air cells bilaterally, stable. Electronically Signed  By: Lowella Grip III M.D.   On: 09/06/2017 12:01     Scheduled Meds: . bisacodyl  5 mg Oral Daily  . ipratropium-albuterol  3 mL Nebulization Q6H  . [START ON 09/08/2017] metoCLOPramide (REGLAN) injection  10 mg Intravenous Once   Followed by  . [START ON 09/09/2017] metoCLOPramide (REGLAN) injection  10 mg Intravenous Once  . metoprolol tartrate  2.5 mg Intravenous Q6H  . oxybutynin  10 mg Oral QHS  . pantoprazole (PROTONIX) IV  40 mg Intravenous Q12H  . [START ON 09/08/2017] peg 3350 powder  0.5 kit Oral Once   And  . [START ON 09/09/2017] peg 3350 powder  0.5 kit Oral Once  . sodium chloride flush  3 mL Intravenous Q12H  . traZODone  50 mg Oral QHS   Continuous Infusions: . sodium chloride 1,000 mL (09/07/17 0933)  . ciprofloxacin Stopped (09/07/17 0503)  . levETIRAcetam Stopped (09/07/17 0629)     LOS: 1 day   Time Spent in minutes   30 minutes  Salim Forero D.O. on 09/07/2017 at 12:39 PM  Between 7am  to 7pm - Pager - [phone]  After 7pm go to [email or web address] - password [email or web address]  And look for the night coverage person covering for me after hours  [facility] Office  [phone]

## 2017-09-08 ENCOUNTER — Other Ambulatory Visit: Payer: Self-pay

## 2017-09-08 DIAGNOSIS — R1310 Dysphagia, unspecified: Secondary | ICD-10-CM

## 2017-09-08 DIAGNOSIS — I4891 Unspecified atrial fibrillation: Secondary | ICD-10-CM

## 2017-09-08 DIAGNOSIS — Z791 Long term (current) use of non-steroidal anti-inflammatories (NSAID): Secondary | ICD-10-CM

## 2017-09-08 DIAGNOSIS — R195 Other fecal abnormalities: Secondary | ICD-10-CM

## 2017-09-08 LAB — COMPREHENSIVE METABOLIC PANEL
ALBUMIN: 2.2 g/dL — AB (ref 3.5–5.0)
ALT: 58 U/L — AB (ref 14–54)
AST: 109 U/L — AB (ref 15–41)
Alkaline Phosphatase: 109 U/L (ref 38–126)
Anion gap: 7 (ref 5–15)
BUN: 19 mg/dL (ref 6–20)
CHLORIDE: 106 mmol/L (ref 101–111)
CO2: 26 mmol/L (ref 22–32)
CREATININE: 1.14 mg/dL — AB (ref 0.44–1.00)
Calcium: 7.5 mg/dL — ABNORMAL LOW (ref 8.9–10.3)
GFR calc Af Amer: 50 mL/min — ABNORMAL LOW (ref >60)
GFR calc non Af Amer: 43 mL/min — ABNORMAL LOW (ref >60)
GLUCOSE: 91 mg/dL (ref 65–99)
POTASSIUM: 3.2 mmol/L — AB (ref 3.5–5.1)
SODIUM: 139 mmol/L (ref 135–145)
Total Bilirubin: 1.2 mg/dL (ref 0.3–1.2)
Total Protein: 5.3 g/dL — ABNORMAL LOW (ref 6.5–8.1)

## 2017-09-08 LAB — CBC
HCT: 34.1 % — ABNORMAL LOW (ref 36.0–46.0)
Hemoglobin: 11.2 g/dL — ABNORMAL LOW (ref 12.0–15.0)
MCH: 30.5 pg (ref 26.0–34.0)
MCHC: 32.8 g/dL (ref 30.0–36.0)
MCV: 92.9 fL (ref 78.0–100.0)
PLATELETS: 164 10*3/uL (ref 150–400)
RBC: 3.67 MIL/uL — AB (ref 3.87–5.11)
RDW: 15.5 % (ref 11.5–15.5)
WBC: 20.5 10*3/uL — AB (ref 4.0–10.5)

## 2017-09-08 LAB — MAGNESIUM: MAGNESIUM: 2 mg/dL (ref 1.7–2.4)

## 2017-09-08 LAB — STREP PNEUMONIAE URINARY ANTIGEN: Strep Pneumo Urinary Antigen: NEGATIVE

## 2017-09-08 MED ORDER — FUROSEMIDE 10 MG/ML IJ SOLN
40.0000 mg | Freq: Once | INTRAMUSCULAR | Status: AC
  Administered 2017-09-08: 40 mg via INTRAVENOUS
  Filled 2017-09-08: qty 4

## 2017-09-08 MED ORDER — ORAL CARE MOUTH RINSE
15.0000 mL | Freq: Two times a day (BID) | OROMUCOSAL | Status: DC
  Administered 2017-09-08 – 2017-09-11 (×7): 15 mL via OROMUCOSAL

## 2017-09-08 MED ORDER — IPRATROPIUM-ALBUTEROL 0.5-2.5 (3) MG/3ML IN SOLN
3.0000 mL | Freq: Three times a day (TID) | RESPIRATORY_TRACT | Status: DC
  Administered 2017-09-08 – 2017-09-10 (×7): 3 mL via RESPIRATORY_TRACT
  Filled 2017-09-08 (×8): qty 3

## 2017-09-08 MED ORDER — POTASSIUM CHLORIDE CRYS ER 20 MEQ PO TBCR
40.0000 meq | EXTENDED_RELEASE_TABLET | Freq: Once | ORAL | Status: AC
  Administered 2017-09-08: 40 meq via ORAL
  Filled 2017-09-08: qty 2

## 2017-09-08 NOTE — Progress Notes (Signed)
PROGRESS NOTE    Kayla Kent  QIW:[id number] DOB: [birth year] DOA: 09/06/2017 PCP: Biagio Borg, MD   Chief Complaint  Patient presents with  . Fall  . GI Bleeding    Brief Narrative:  HPI On 09/06/2017 by Ms. [name], NP Kayla Kent is a 83 y.o. female with medical history significant for stage III chronic kidney disease, dyslipidemia, chronic atrial fibrillation on Eliquis, hypertension, seizure disorder on AEDs, and recent FNA of thyroid nodule.  EMS was called to the patient's home today after heating and air person discovered the patient on the floor.  Upon EMS arrival they noticed a toilet full of dark tarry stools.  Patient reportedly vomited red emesis the previous night but states she ate tomato soup.  In the ER the patient remains confused although at times can answer some questions appropriately.  BP somewhat suboptimal with systolic ranging from 417-408.  She was not orthostatic when checked.  Stools were heme positive.  BUN elevated at 31.  AST elevated at 66.  Lactic acid elevated at 2.44 and 2.05.  White count elevated at 17,600 differential not obtained.  Hemoglobin 12.7 she is consistent with her baseline.  Patient has abnormal urinalysis and previous urine culture from September revealed ESBL E. coli therefore patient placed on contact isolation.  No focal neurological deficits detected from baseline.  Assessment & Plan   Acute symptomatic GI bleed, likely upper -Presented to the ER after being found down in her bathroom, toilet full of dark stool -Patient was taking daily NSAIDs, and the context of Eliquis for anticoagulation -FOBT + -Hemoglobin currently stable, 11.2 -Gastroenterology consulted and appreciated- placed on full liquids today. EGD and colonoscopy on 09/09/2017 at noon. Clear liquids to start on 09/08/2017 along with split bowel prep -Continue Protonix IV twice a day -Continue to monitor CBC  Sepsis secondary to UTI/pneumonia -developed fever,  tachycardia, tachypnea, with leukocytosis- all improving -UA: Many bacteria, 0-5 WBC, trace leukocytes -Complains of dysuria and urinary frequency -In September 2018, patient had ESBL Escherichia coli, which was sensitive to Cipro -CXR obtained concerning for RLL pneumonia -Blood cultures show no growth to date -Urine culture >100K Klebsiella pneumoniae -Urine strep pneumonia antigen pending -Discontinued cipro and placed patient on broad spectrum antibiotics with vanc and merrem (h/o of ESBL)  Essential hypertension -Amlodipine, diltiazem held in the setting of GI bleed and risk of hypotension -Continue to monitor  Chronic atrial fibrillation with periods of RVR -CHADSVASC 4 (based on gender, age, hypertension) -Eliquis held due to GI bleed -Continue telemetry monitoring -was placed on cardizem drip however, patient had low BP -Cardiology consulted and appreciated, and started on amiodarone drip -magnesium was 1.7 and replaced- currently 2 -continuing to replace potassium   Pulmonary edema/Acute diastolic dysfunction -possibly secondary to tachycardia and IVF -Last echocardiogram 04/08/2017 shows EF 55-60%, PA peak pressure of 68mHg -CXR showed some pulm edema -Given one dose of IV lasix on 11/21 -discontinued IV fluids -cardiology giving additional dose today  Chronic kidney disease, stage III -Creatinine on admission 1.51 (however GFR within stage III), appears that baseline is proximal 1.07-1.08 -Currently creatinine 1.14, continue to monitor BMP  Confusion/dementia? -Per outpatient documentation, patient has had some memory lapses for the past 1 year -Acute changes possibly due to GI bleeding versus infection -CT head showed atrophy with periventricular small vessel disease. Chronic encephalomalacia of the right temporal lobe. No acute infarct. 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 -Patient currently lives alone -SEducation officer, museumconsulted -PT  and OT will need  to evaluate patient  Seizure disorder -Continue Keppra -Keppra level pending  Thyroid nodule -Recent FNA of nodule within permanent pathology -Patient should follow-up with her primary care physician regarding this  Dyslipidemia -Statin held  DVT Prophylaxis  SCDs  Code Status: Full  Family Communication: None at bedside  Disposition Plan: Admitted, pending further GI workup  Consultants Gastroenterology Cardiology  Procedures  None  Antibiotics   Anti-infectives (From admission, onward)   Start     Dose/Rate Route Frequency Ordered Stop   09/08/17 1800  vancomycin (VANCOCIN) 500 mg in sodium chloride 0.9 % 100 mL IVPB     500 mg 100 mL/hr over 60 Minutes Intravenous Every 24 hours 09/07/17 1725     09/07/17 1800  vancomycin (VANCOCIN) IVPB 1000 mg/200 mL premix     1,000 mg 200 mL/hr over 60 Minutes Intravenous  Once 09/07/17 1725 09/07/17 2209   09/07/17 1800  meropenem (MERREM) 1 g in sodium chloride 0.9 % 100 mL IVPB     1 g 200 mL/hr over 30 Minutes Intravenous Every 12 hours 09/07/17 1725     09/07/17 1730  fluconazole (DIFLUCAN) IVPB 150 mg     150 mg 75 mL/hr over 60 Minutes Intravenous  Once 09/07/17 1648 09/07/17 1944   09/06/17 1515  ciprofloxacin (CIPRO) IVPB 400 mg  Status:  Discontinued     400 mg 200 mL/hr over 60 Minutes Intravenous  Once 09/06/17 1514 09/06/17 1753   09/06/17 0400  ciprofloxacin (CIPRO) IVPB 400 mg  Status:  Discontinued     400 mg 200 mL/hr over 60 Minutes Intravenous Every 12 hours 09/06/17 1623 09/07/17 1653      Subjective:   Kayla Kent seen and examined today. Patient states she is dealing with "a lot, but does not want to talk about it.". Currently denies chest pain, shortness of breath, abdominal pain, nausea, vomiting.  Objective:   Vitals:   09/08/17 0500 09/08/17 0822 09/08/17 0845 09/08/17 1036  BP:  135/73  (!) 112/47  Pulse:  (!) 105  97  Resp:  (!) 27  (!) 25  Temp:  97.6 F (36.4 C)  97.7 F (36.5 C)    TempSrc:  Oral  Oral  SpO2:  92% 99%   Weight: 52.6 kg (116 lb)   48 kg (105 lb 13.1 oz)  Height:    5' 7"  (1.702 m)    Intake/Output Summary (Last 24 hours) at 09/08/2017 1053 Last data filed at 09/08/2017 1610 Gross per 24 hour  Intake 1822.54 ml  Output 1375 ml  Net 447.54 ml   Filed Weights   09/07/17 0500 09/08/17 0500 09/08/17 1036  Weight: 53.1 kg (117 lb 1 oz) 52.6 kg (116 lb) 48 kg (105 lb 13.1 oz)   Exam  General: Well developed, well nourished, NAD, appears stated age  HEENT: NCAT, mucous membranes moist.   Cardiovascular: S1 S2 auscultated, irregular  Respiratory: Clear to auscultation bilaterally with equal chest rise  Abdomen: Soft, nontender, nondistended, + bowel sounds  Extremities: warm dry without cyanosis clubbing or edema  Neuro: AAOx3 (however, does have periods of confusion, states today is "butterfly day"), nonfocal  Psych: Appropriate  Data Reviewed: I have personally reviewed following labs and imaging studies  CBC: Recent Labs  Lab 09/06/17 1102 09/06/17 1649 09/07/17 0347 09/08/17 0336  WBC 17.6* 22.1* 24.9* 20.5*  HGB 12.7 11.7* 11.1* 11.2*  HCT 38.8 35.7* 33.2* 34.1*  MCV 93.9 94.4 94.3 92.9  PLT 185  170 173 919   Basic Metabolic Panel: Recent Labs  Lab 09/06/17 1102 09/06/17 1649 09/07/17 0347 09/07/17 1620 09/08/17 0336 09/08/17 0716  NA 138  --  139 140 139  --   K 3.8  --  4.0 4.6 3.2*  --   CL 102  --  109 111 106  --   CO2 26  --  23 18* 26  --   GLUCOSE 106*  --  65 84 91  --   BUN 31*  --  22* 22* 19  --   CREATININE 1.51*  --  1.07* 1.33* 1.14*  --   CALCIUM 8.4*  --  7.8* 8.0* 7.5*  --   MG  --  1.4*  --  1.7  --  2.0   GFR: Estimated Creatinine Clearance: 28.3 mL/min (A) (by C-G formula based on SCr of 1.14 mg/dL (H)). Liver Function Tests: Recent Labs  Lab 09/06/17 1102 09/07/17 0347 09/08/17 0336  AST 66* 110* 109*  ALT 34 48 58*  ALKPHOS 105 86 109  BILITOT 1.2 1.4* 1.2  PROT 5.1* 4.7* 5.3*   ALBUMIN 2.6* 2.2* 2.2*   No results for input(s): LIPASE, AMYLASE in the last 168 hours. No results for input(s): AMMONIA in the last 168 hours. Coagulation Profile: No results for input(s): INR, PROTIME in the last 168 hours. Cardiac Enzymes: No results for input(s): CKTOTAL, CKMB, CKMBINDEX, TROPONINI in the last 168 hours. BNP (last 3 results) Recent Labs    10/22/16 1135  PROBNP 410.0*   HbA1C: No results for input(s): HGBA1C in the last 72 hours. CBG: No results for input(s): GLUCAP in the last 168 hours. Lipid Profile: No results for input(s): CHOL, HDL, LDLCALC, TRIG, CHOLHDL, LDLDIRECT in the last 72 hours. Thyroid Function Tests: No results for input(s): TSH, T4TOTAL, FREET4, T3FREE, THYROIDAB in the last 72 hours. Anemia Panel: No results for input(s): VITAMINB12, FOLATE, FERRITIN, TIBC, IRON, RETICCTPCT in the last 72 hours. Urine analysis:    Component Value Date/Time   COLORURINE AMBER (A) 09/06/2017 1427   APPEARANCEUR HAZY (A) 09/06/2017 1427   APPEARANCEUR Clear 07/22/2017 1022   LABSPEC 1.020 09/06/2017 1427   PHURINE 5.0 09/06/2017 1427   GLUCOSEU NEGATIVE 09/06/2017 1427   GLUCOSEU NEGATIVE 04/13/2017 0839   HGBUR LARGE (A) 09/06/2017 1427   BILIRUBINUR NEGATIVE 09/06/2017 1427   BILIRUBINUR Negative 07/22/2017 1022   KETONESUR NEGATIVE 09/06/2017 1427   PROTEINUR NEGATIVE 09/06/2017 1427   UROBILINOGEN 0.2 07/04/2017 0930   UROBILINOGEN 0.2 04/13/2017 0839   NITRITE NEGATIVE 09/06/2017 1427   LEUKOCYTESUR TRACE (A) 09/06/2017 1427   LEUKOCYTESUR Negative 07/22/2017 1022   Sepsis Labs: @LABRCNTIP (procalcitonin:4,lacticidven:4)  ) Recent Results (from the past 240 hour(s))  Blood culture (routine x 2)     Status: None (Preliminary result)   Collection Time: 09/06/17 11:44 AM  Result Value Ref Range Status   Specimen Description BLOOD RIGHT ARM UPPER  Final   Special Requests IN PEDIATRIC BOTTLE Blood Culture adequate volume  Final   Culture  NO GROWTH 1 DAY  Final   Report Status PENDING  Incomplete  Blood culture (routine x 2)     Status: None (Preliminary result)   Collection Time: 09/06/17 12:08 PM  Result Value Ref Range Status   Specimen Description BLOOD RIGHT ANTECUBITAL  Final   Special Requests   Final    BOTTLES DRAWN AEROBIC AND ANAEROBIC Blood Culture adequate volume   Culture NO GROWTH 1 DAY  Final   Report Status  PENDING  Incomplete  Urine Culture     Status: Abnormal (Preliminary result)   Collection Time: 09/06/17  2:27 PM  Result Value Ref Range Status   Specimen Description URINE, RANDOM  Final   Special Requests NONE  Final   Culture (A)  Final    >=100,000 COLONIES/mL KLEBSIELLA PNEUMONIAE REPEATING SENSITIVITIES    Report Status PENDING  Incomplete  MRSA PCR Screening     Status: None   Collection Time: 09/06/17  5:15 PM  Result Value Ref Range Status   MRSA by PCR NEGATIVE NEGATIVE Final    Comment:        The GeneXpert MRSA Assay (FDA approved for NASAL specimens only), is one component of a comprehensive MRSA colonization surveillance program. It is not intended to diagnose MRSA infection nor to guide or monitor treatment for MRSA infections.       Radiology Studies: Dg Chest 2 View  Result Date: 09/06/2017 CLINICAL DATA:  Dizziness.  Vomiting.  Third mental status . EXAM: CHEST  2 VIEW COMPARISON:  CT 08/23/2017. PET-CT 05/25/2017. Chest x-ray 04/14/2017. FINDINGS: Cardiomegaly with increase interstitial prominence bilaterally, particular on the left. Small bilateral pleural effusions cannot be excluded. Findings suggest CHF. Bilateral pneumonia cannot be excluded. Reference is made to prior PET-CT report of 05/25/2017. IMPRESSION: Findings consistent with congestive heart failure with bilateral from interstitial edema. Small bilateral pleural effusions cannot be excluded. Electronically Signed   By: Marcello Moores  Register   On: 09/06/2017 13:36   Ct Head Wo Contrast  Result Date:  09/06/2017 CLINICAL DATA:  Altered mental status with questionable fall. Slurred speech. EXAM: CT HEAD WITHOUT CONTRAST TECHNIQUE: Contiguous axial images were obtained from the base of the skull through the vertex without intravenous contrast. COMPARISON:  April 06, 2017 head CT; brain MRI September 23, 2014 FINDINGS: Brain: There is mild diffuse atrophy. There is no intracranial mass, hemorrhage, extra-axial fluid collection, or midline shift. There is chronic right temporal lobe encephalomalacia, stable. There is a prior infarct involving the head of the caudate nucleus on the right. There is patchy small vessel disease in the centra semiovale bilaterally, stable. No acute infarct demonstrable on this study. Vascular: No hyperdense vessel. There is calcification in each carotid siphon and distal vertebral artery. Skull: The bony calvarium appears intact. Small bony enostosis arising from the left frontal bone. There is evidence of old trauma with remodeling involving the left mandibular condyle with osteoarthritic change in this area. Sinuses/Orbits: There is mucosal thickening in several ethmoid air cells. Other visualized paranasal sinuses are clear. Orbits appear symmetric bilaterally except for evidence of cataract removal on the left. Other: There is chronic opacification of several inferior mastoid air cells. Most mastoid air cells bilaterally are clear. IMPRESSION: 1. Atrophy with periventricular small vessel disease, stable. Chronic encephalomalacia right temporal lobe, stable. 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 No new gray-white compartment lesion. No acute infarct. 2. No appreciable mass, hemorrhage, or extra-axial fluid collection. 3.  Foci of arterial vascular calcification evident. 4. Areas of paranasal sinus disease. Mild opacification of several inferior mastoid air cells bilaterally, stable. Electronically Signed   By: Lowella Grip III M.D.   On: 09/06/2017 12:01   Dg Chest  Port 1 View  Result Date: 09/07/2017 CLINICAL DATA:  Rapid atrial fibrillation. EXAM: PORTABLE CHEST 1 VIEW COMPARISON:  Chest x-ray from yesterday. FINDINGS: The left costophrenic angle is excluded from the field of view. Stable cardiomegaly and increased interstitial markings. Increased patchy opacities in the  right lower lobe. No pneumothorax or large pleural effusion. No acute osseous abnormality. IMPRESSION: 1. Increased patchy opacities in the right lower lobe, concerning for aspiration or pneumonia. 2. Stable cardiomegaly and interstitial pulmonary edema. Electronically Signed   By: Titus Dubin M.D.   On: 09/07/2017 16:21     Scheduled Meds: . ipratropium-albuterol  3 mL Nebulization TID  . metoCLOPramide (REGLAN) injection  10 mg Intravenous Once   Followed by  . [START ON 09/09/2017] metoCLOPramide (REGLAN) injection  10 mg Intravenous Once  . oxybutynin  10 mg Oral QHS  . pantoprazole (PROTONIX) IV  40 mg Intravenous Q12H  . peg 3350 powder  0.5 kit Oral Once   And  . [START ON 09/09/2017] peg 3350 powder  0.5 kit Oral Once  . sodium chloride flush  3 mL Intravenous Q12H  . traZODone  50 mg Oral QHS   Continuous Infusions: . sodium chloride 100 mL/hr at 09/08/17 0714  . amiodarone 30 mg/hr (09/08/17 0841)  . diltiazem (CARDIZEM) infusion 5 mg/hr (09/07/17 1605)  . levETIRAcetam Stopped (09/08/17 0421)  . meropenem (MERREM) IV Stopped (09/08/17 0548)  . vancomycin       LOS: 2 days   Time Spent in minutes   45 minutes  Leahna Hewson D.O. on 09/08/2017 at 10:53 AM  Between 7am to 7pm - Pager - [phone]  After 7pm go to [email or web address] - password [email or web address]  And look for the night coverage person covering for me after hours  [facility] Office  [phone]

## 2017-09-08 NOTE — Progress Notes (Signed)
[facility] ROUNDING NOTE   Subjective: Appears less confused, oriented to name and place. HR is better controlled. Denies any abd pain, nausea or vomiting.    Objective: Vital signs in last 24 hours: Temp:  [97.6 F (36.4 C)-102.2 F (39 C)] 98 F (36.7 C) (11/22 1141) Pulse Rate:  [77-157] 77 (11/22 1141) Resp:  [18-37] 21 (11/22 1141) BP: (106-138)/(39-83) 124/55 (11/22 1141) SpO2:  [87 %-100 %] 100 % (11/22 1141) Weight:  [105 lb 13.1 oz (48 kg)-116 lb (52.6 kg)] 105 lb 13.1 oz (48 kg) (11/22 1036) Last BM Date: 09/07/17 General: NAD Abdomen:soft, NTND     Intake/Output from previous day: 11/21 0701 - 11/22 0700 In: 1819.5 [I.V.:1617; IV Piggyback:202.5] Out: 1375 [Urine:1375] Intake/Output this shift: Total I/O In: 3 [I.V.:3] Out: 1200 [Urine:1200]   Lab Results: Recent Labs    09/06/17 1649 09/07/17 0347 09/08/17 0336  WBC 22.1* 24.9* 20.5*  HGB 11.7* 11.1* 11.2*  PLT 170 173 164  MCV 94.4 94.3 92.9   BMET Recent Labs    09/07/17 0347 09/07/17 1620 09/08/17 0336  NA 139 140 139  K 4.0 4.6 3.2*  CL 109 111 106  CO2 23 18* 26  GLUCOSE 65 84 91  BUN 22* 22* 19  CREATININE 1.07* 1.33* 1.14*  CALCIUM 7.8* 8.0* 7.5*   LFT Recent Labs    09/06/17 1102 09/07/17 0347 09/08/17 0336  PROT 5.1* 4.7* 5.3*  ALBUMIN 2.6* 2.2* 2.2*  AST 66* 110* 109*  ALT 34 48 58*  ALKPHOS 105 86 109  BILITOT 1.2 1.4* 1.2   PT/INR No results for input(s): INR in the last 72 hours.    Imaging/Other results: Dg Chest 2 View  Result Date: 09/06/2017 CLINICAL DATA:  Dizziness.  Vomiting.  Third mental status . EXAM: CHEST  2 VIEW COMPARISON:  CT 08/23/2017. PET-CT 05/25/2017. Chest x-ray 04/14/2017. FINDINGS: Cardiomegaly with increase interstitial prominence bilaterally, particular on the left. Small bilateral pleural effusions cannot be excluded. Findings suggest CHF. Bilateral pneumonia cannot be excluded. Reference is made to prior PET-CT report  of 05/25/2017. IMPRESSION: Findings consistent with congestive heart failure with bilateral from interstitial edema. Small bilateral pleural effusions cannot be excluded. Electronically Signed   By: Marcello Moores  Register   On: 09/06/2017 13:36   Dg Chest Port 1 View  Result Date: 09/07/2017 CLINICAL DATA:  Rapid atrial fibrillation. EXAM: PORTABLE CHEST 1 VIEW COMPARISON:  Chest x-ray from yesterday. FINDINGS: The left costophrenic angle is excluded from the field of view. Stable cardiomegaly and increased interstitial markings. Increased patchy opacities in the right lower lobe. No pneumothorax or large pleural effusion. No acute osseous abnormality. IMPRESSION: 1. Increased patchy opacities in the right lower lobe, concerning for aspiration or pneumonia. 2. Stable cardiomegaly and interstitial pulmonary edema. Electronically Signed   By: Titus Dubin M.D.   On: 09/07/2017 16:21      Assessment &Plan  82 year old female with history of A. fib admitted after being found down, septic with elevated lactic acid and leukocytosis secondary to urosepsis and?  Pneumonia A. fib with rapid ventricular response improved with amiodarone drip Continues to have persistent elevated WBC count Mental status is improving Presentation is not consistent with GI hemorrhage causing altered mental status or syncopal episode. Hgb is stable at 11. Given heme positive stool, h/o frequent NSAID use and intermittent dysphagia will proceed with EGD to evaluate with possible dilation tomorrow Patient is not in a state to be able to do the bowl prep and undergo colonoscopy  and likely will be low yield based on presentation      K. Denzil Magnuson , MD [phone] Mon-Fri 8a-5p [phone] after 5p, weekends, holidays [facility]

## 2017-09-08 NOTE — H&P (View-Only) (Signed)
[facility] ROUNDING NOTE   Subjective: Appears less confused, oriented to name and place. HR is better controlled. Denies any abd pain, nausea or vomiting.    Objective: Vital signs in last 24 hours: Temp:  [97.6 F (36.4 C)-102.2 F (39 C)] 98 F (36.7 C) (11/22 1141) Pulse Rate:  [77-157] 77 (11/22 1141) Resp:  [18-37] 21 (11/22 1141) BP: (106-138)/(39-83) 124/55 (11/22 1141) SpO2:  [87 %-100 %] 100 % (11/22 1141) Weight:  [105 lb 13.1 oz (48 kg)-116 lb (52.6 kg)] 105 lb 13.1 oz (48 kg) (11/22 1036) Last BM Date: 09/07/17 General: NAD Abdomen:soft, NTND     Intake/Output from previous day: 11/21 0701 - 11/22 0700 In: 1819.5 [I.V.:1617; IV Piggyback:202.5] Out: 1375 [Urine:1375] Intake/Output this shift: Total I/O In: 3 [I.V.:3] Out: 1200 [Urine:1200]   Lab Results: Recent Labs    09/06/17 1649 09/07/17 0347 09/08/17 0336  WBC 22.1* 24.9* 20.5*  HGB 11.7* 11.1* 11.2*  PLT 170 173 164  MCV 94.4 94.3 92.9   BMET Recent Labs    09/07/17 0347 09/07/17 1620 09/08/17 0336  NA 139 140 139  K 4.0 4.6 3.2*  CL 109 111 106  CO2 23 18* 26  GLUCOSE 65 84 91  BUN 22* 22* 19  CREATININE 1.07* 1.33* 1.14*  CALCIUM 7.8* 8.0* 7.5*   LFT Recent Labs    09/06/17 1102 09/07/17 0347 09/08/17 0336  PROT 5.1* 4.7* 5.3*  ALBUMIN 2.6* 2.2* 2.2*  AST 66* 110* 109*  ALT 34 48 58*  ALKPHOS 105 86 109  BILITOT 1.2 1.4* 1.2   PT/INR No results for input(s): INR in the last 72 hours.    Imaging/Other results: Dg Chest 2 View  Result Date: 09/06/2017 CLINICAL DATA:  Dizziness.  Vomiting.  Third mental status . EXAM: CHEST  2 VIEW COMPARISON:  CT 08/23/2017. PET-CT 05/25/2017. Chest x-ray 04/14/2017. FINDINGS: Cardiomegaly with increase interstitial prominence bilaterally, particular on the left. Small bilateral pleural effusions cannot be excluded. Findings suggest CHF. Bilateral pneumonia cannot be excluded. Reference is made to prior PET-CT report  of 05/25/2017. IMPRESSION: Findings consistent with congestive heart failure with bilateral from interstitial edema. Small bilateral pleural effusions cannot be excluded. Electronically Signed   By: Marcello Moores  Register   On: 09/06/2017 13:36   Dg Chest Port 1 View  Result Date: 09/07/2017 CLINICAL DATA:  Rapid atrial fibrillation. EXAM: PORTABLE CHEST 1 VIEW COMPARISON:  Chest x-ray from yesterday. FINDINGS: The left costophrenic angle is excluded from the field of view. Stable cardiomegaly and increased interstitial markings. Increased patchy opacities in the right lower lobe. No pneumothorax or large pleural effusion. No acute osseous abnormality. IMPRESSION: 1. Increased patchy opacities in the right lower lobe, concerning for aspiration or pneumonia. 2. Stable cardiomegaly and interstitial pulmonary edema. Electronically Signed   By: Titus Dubin M.D.   On: 09/07/2017 16:21      Assessment &Plan  83 year old female with history of A. fib admitted after being found down, septic with elevated lactic acid and leukocytosis secondary to urosepsis and?  Pneumonia A. fib with rapid ventricular response improved with amiodarone drip Continues to have persistent elevated WBC count Mental status is improving Presentation is not consistent with GI hemorrhage causing altered mental status or syncopal episode. Hgb is stable at 11. Given heme positive stool, h/o frequent NSAID use and intermittent dysphagia will proceed with EGD to evaluate with possible dilation tomorrow Patient is not in a state to be able to do the bowl prep and undergo colonoscopy  and likely will be low yield based on presentation      K. Denzil Magnuson , MD [phone] Mon-Fri 8a-5p [phone] after 5p, weekends, holidays [facility]

## 2017-09-08 NOTE — Progress Notes (Signed)
Progress Note  Patient Name: Kayla Kent Date of Encounter: 09/08/2017  Primary cardiologist: Dr. Harrington Challenger    Subjective   No complaints  Inpatient Medications    Scheduled Meds: . bisacodyl  5 mg Oral Daily  . ipratropium-albuterol  3 mL Nebulization TID  . metoCLOPramide (REGLAN) injection  10 mg Intravenous Once   Followed by  . [START ON 09/09/2017] metoCLOPramide (REGLAN) injection  10 mg Intravenous Once  . oxybutynin  10 mg Oral QHS  . pantoprazole (PROTONIX) IV  40 mg Intravenous Q12H  . peg 3350 powder  0.5 kit Oral Once   And  . [START ON 09/09/2017] peg 3350 powder  0.5 kit Oral Once  . potassium chloride  40 mEq Oral Once  . sodium chloride flush  3 mL Intravenous Q12H  . traZODone  50 mg Oral QHS   Continuous Infusions: . sodium chloride 100 mL/hr at 09/08/17 0714  . amiodarone 30 mg/hr (09/08/17 0841)  . diltiazem (CARDIZEM) infusion 5 mg/hr (09/07/17 1605)  . levETIRAcetam Stopped (09/08/17 0421)  . meropenem (MERREM) IV Stopped (09/08/17 0548)  . vancomycin     PRN Meds: acetaminophen **OR** acetaminophen, ALPRAZolam, ondansetron **OR** ondansetron (ZOFRAN) IV, sodium chloride   Vital Signs    Vitals:   09/08/17 0438 09/08/17 0500 09/08/17 0822 09/08/17 0845  BP: 138/66  135/73   Pulse: 98  (!) 105   Resp: (!) 25  (!) 27   Temp: 98.3 F (36.8 C)  97.6 F (36.4 C)   TempSrc: Oral  Oral   SpO2: 98%  92% 99%  Weight:  116 lb (52.6 kg)      Intake/Output Summary (Last 24 hours) at 09/08/2017 0932 Last data filed at 09/08/2017 0517 Gross per 24 hour  Intake 1819.54 ml  Output 1375 ml  Net 444.54 ml   Filed Weights   09/07/17 0500 09/08/17 0500  Weight: 117 lb 1 oz (53.1 kg) 116 lb (52.6 kg)    Telemetry    afib 90s-110s- Personally Reviewed  ECG    na  Physical Exam   GEN: No acute distress.   Neck: elevated JVD Cardiac: irreg Respiratory: Clear to auscultation bilaterally. GI: Soft, nontender, non-distended  MS: No  edema; No deformity. Neuro:  Nonfocal  Psych: Normal affect   Labs    Chemistry Recent Labs  Lab 09/06/17 1102 09/07/17 0347 09/07/17 1620 09/08/17 0336  NA 138 139 140 139  K 3.8 4.0 4.6 3.2*  CL 102 109 111 106  CO2 26 23 18* 26  GLUCOSE 106* 65 84 91  BUN 31* 22* 22* 19  CREATININE 1.51* 1.07* 1.33* 1.14*  CALCIUM 8.4* 7.8* 8.0* 7.5*  PROT 5.1* 4.7*  --  5.3*  ALBUMIN 2.6* 2.2*  --  2.2*  AST 66* 110*  --  109*  ALT 34 48  --  58*  ALKPHOS 105 86  --  109  BILITOT 1.2 1.4*  --  1.2  GFRNONAA 31* 47* 36* 43*  GFRAA 36* 54* 42* 50*  ANIONGAP 10 7 11 7      Hematology Recent Labs  Lab 09/06/17 1649 09/07/17 0347 09/08/17 0336  WBC 22.1* 24.9* 20.5*  RBC 3.78* 3.52* 3.67*  HGB 11.7* 11.1* 11.2*  HCT 35.7* 33.2* 34.1*  MCV 94.4 94.3 92.9  MCH 31.0 31.5 30.5  MCHC 32.8 33.4 32.8  RDW 15.2 15.5 15.5  PLT 170 173 164    Cardiac EnzymesNo results for input(s): TROPONINI in the last 168 hours. No  results for input(s): TROPIPOC in the last 168 hours.   BNP Recent Labs  Lab 09/06/17 1649  BNP 722.5*     DDimer No results for input(s): DDIMER in the last 168 hours.   Radiology    Dg Chest 2 View  Result Date: 09/06/2017 CLINICAL DATA:  Dizziness.  Vomiting.  Third mental status . EXAM: CHEST  2 VIEW COMPARISON:  CT 08/23/2017. PET-CT 05/25/2017. Chest x-ray 04/14/2017. FINDINGS: Cardiomegaly with increase interstitial prominence bilaterally, particular on the left. Small bilateral pleural effusions cannot be excluded. Findings suggest CHF. Bilateral pneumonia cannot be excluded. Reference is made to prior PET-CT report of 05/25/2017. IMPRESSION: Findings consistent with congestive heart failure with bilateral from interstitial edema. Small bilateral pleural effusions cannot be excluded. Electronically Signed   By: Marcello Moores  Register   On: 09/06/2017 13:36   Ct Head Wo Contrast  Result Date: 09/06/2017 CLINICAL DATA:  Altered mental status with questionable  fall. Slurred speech. EXAM: CT HEAD WITHOUT CONTRAST TECHNIQUE: Contiguous axial images were obtained from the base of the skull through the vertex without intravenous contrast. COMPARISON:  April 06, 2017 head CT; brain MRI September 23, 2014 FINDINGS: Brain: There is mild diffuse atrophy. There is no intracranial mass, hemorrhage, extra-axial fluid collection, or midline shift. There is chronic right temporal lobe encephalomalacia, stable. There is a prior infarct involving the head of the caudate nucleus on the right. There is patchy small vessel disease in the centra semiovale bilaterally, stable. No acute infarct demonstrable on this study. Vascular: No hyperdense vessel. There is calcification in each carotid siphon and distal vertebral artery. Skull: The bony calvarium appears intact. Small bony enostosis arising from the left frontal bone. There is evidence of old trauma with remodeling involving the left mandibular condyle with osteoarthritic change in this area. Sinuses/Orbits: There is mucosal thickening in several ethmoid air cells. Other visualized paranasal sinuses are clear. Orbits appear symmetric bilaterally except for evidence of cataract removal on the left. Other: There is chronic opacification of several inferior mastoid air cells. Most mastoid air cells bilaterally are clear. IMPRESSION: 1. Atrophy with periventricular small vessel disease, stable. Chronic encephalomalacia right temporal lobe, stable. 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 No new gray-white compartment lesion. No acute infarct. 2. No appreciable mass, hemorrhage, or extra-axial fluid collection. 3.  Foci of arterial vascular calcification evident. 4. Areas of paranasal sinus disease. Mild opacification of several inferior mastoid air cells bilaterally, stable. Electronically Signed   By: Lowella Grip III M.D.   On: 09/06/2017 12:01   Dg Chest Port 1 View  Result Date: 09/07/2017 CLINICAL DATA:  Rapid atrial  fibrillation. EXAM: PORTABLE CHEST 1 VIEW COMPARISON:  Chest x-ray from yesterday. FINDINGS: The left costophrenic angle is excluded from the field of view. Stable cardiomegaly and increased interstitial markings. Increased patchy opacities in the right lower lobe. No pneumothorax or large pleural effusion. No acute osseous abnormality. IMPRESSION: 1. Increased patchy opacities in the right lower lobe, concerning for aspiration or pneumonia. 2. Stable cardiomegaly and interstitial pulmonary edema. Electronically Signed   By: Titus Dubin M.D.   On: 09/07/2017 16:21    Cardiac Studies    Patient Profile  ELAURA CALIX is a 83 y.o. female with a hx of stage 3 CKD, HLD, chronic afib on Eliquis, ablation for SVT 2010, HTN, seizure disorder, PVD and hypermetabolic thyroid nodule s/p FNA 07/2017 who is being seen today for the evaluation of atrial fibrillation with RVR at the request of  Mikhail.    Assessment & Plan    1. Chronic afib - afib with RVR in setting of GI bleed and possible urosepsis - started on amio gtt in setting of RVR and soft bp's, anticoag held due to GI bleed - good rate control today, expect at least some tachycardia with her infection. As tachcyardia drive weans and bp's stabilize would transition off amio back to oral dilt, likely would start short acting first and titrate, if trends continue could consider doing tomorrow.    2. Pulmonary edema - noted on xray, as well as possible right lower lobe pneumonia - echo 03/2017 LVEF 55-60%, abnormal diastolic function with elevated LA pressure. PASP 49 - suspect edema due to undelrying diastolic dysfunction and tachycardia - gentle diuresis, receiverd lasix 61m IV x 1 yesterday. Net +444 mL yeserday, +2.8 liters since admission. Redose IV lasix 41mx 1 today.    3. Urosepsis - per primary team. WBC 20  4. Hypokalemia - written for oral replacment  For questions or updates, please contact [facility] consult  [email or web address] for contact info under Cardiology/STEMI.      SiMerrily PewMD  09/08/2017, 9:32 AM

## 2017-09-09 ENCOUNTER — Inpatient Hospital Stay (HOSPITAL_COMMUNITY): Payer: Medicare Other | Admitting: Certified Registered Nurse Anesthetist

## 2017-09-09 ENCOUNTER — Encounter (HOSPITAL_COMMUNITY)
Admission: EM | Disposition: A | Discharge status: Rehabilitation Facility | Payer: Self-pay | Source: Home / Self Care | Attending: Internal Medicine

## 2017-09-09 ENCOUNTER — Encounter (HOSPITAL_COMMUNITY): Payer: Self-pay | Admitting: Gastroenterology

## 2017-09-09 DIAGNOSIS — K3189 Other diseases of stomach and duodenum: Secondary | ICD-10-CM

## 2017-09-09 DIAGNOSIS — I509 Heart failure, unspecified: Secondary | ICD-10-CM

## 2017-09-09 DIAGNOSIS — L8990 Pressure ulcer of unspecified site, unspecified stage: Secondary | ICD-10-CM

## 2017-09-09 DIAGNOSIS — K449 Diaphragmatic hernia without obstruction or gangrene: Secondary | ICD-10-CM

## 2017-09-09 DIAGNOSIS — K2970 Gastritis, unspecified, without bleeding: Secondary | ICD-10-CM

## 2017-09-09 DIAGNOSIS — F0390 Unspecified dementia without behavioral disturbance: Secondary | ICD-10-CM

## 2017-09-09 HISTORY — PX: ESOPHAGOGASTRODUODENOSCOPY: SHX5428

## 2017-09-09 LAB — URINE CULTURE

## 2017-09-09 LAB — BASIC METABOLIC PANEL
Anion gap: 7 (ref 5–15)
BUN: 16 mg/dL (ref 6–20)
CALCIUM: 7.6 mg/dL — AB (ref 8.9–10.3)
CO2: 25 mmol/L (ref 22–32)
CREATININE: 0.83 mg/dL (ref 0.44–1.00)
Chloride: 107 mmol/L (ref 101–111)
GFR calc Af Amer: >60 mL/min (ref >60)
GLUCOSE: 94 mg/dL (ref 65–99)
Potassium: 3.8 mmol/L (ref 3.5–5.1)
Sodium: 139 mmol/L (ref 135–145)

## 2017-09-09 LAB — CBC
HEMATOCRIT: 33.2 % — AB (ref 36.0–46.0)
Hemoglobin: 11.1 g/dL — ABNORMAL LOW (ref 12.0–15.0)
MCH: 30.7 pg (ref 26.0–34.0)
MCHC: 33.4 g/dL (ref 30.0–36.0)
MCV: 91.7 fL (ref 78.0–100.0)
PLATELETS: 172 10*3/uL (ref 150–400)
RBC: 3.62 MIL/uL — ABNORMAL LOW (ref 3.87–5.11)
RDW: 15.5 % (ref 11.5–15.5)
WBC: 15.6 10*3/uL — AB (ref 4.0–10.5)

## 2017-09-09 LAB — LEVETIRACETAM LEVEL: LEVETIRACETAM: 13.3 ug/mL (ref 10.0–40.0)

## 2017-09-09 SURGERY — EGD (ESOPHAGOGASTRODUODENOSCOPY)
Anesthesia: Monitor Anesthesia Care

## 2017-09-09 MED ORDER — APIXABAN 2.5 MG PO TABS
2.5000 mg | ORAL_TABLET | Freq: Two times a day (BID) | ORAL | Status: DC
  Administered 2017-09-10 – 2017-09-11 (×3): 2.5 mg via ORAL
  Filled 2017-09-09 (×4): qty 1

## 2017-09-09 MED ORDER — LIDOCAINE HCL (CARDIAC) 20 MG/ML IV SOLN
INTRAVENOUS | Status: DC | PRN
  Administered 2017-09-09: 60 mg via INTRAVENOUS

## 2017-09-09 MED ORDER — PANTOPRAZOLE SODIUM 40 MG PO TBEC
40.0000 mg | DELAYED_RELEASE_TABLET | Freq: Every day | ORAL | Status: DC
  Administered 2017-09-10 – 2017-09-11 (×2): 40 mg via ORAL
  Filled 2017-09-09 (×2): qty 1

## 2017-09-09 MED ORDER — PROPOFOL 10 MG/ML IV BOLUS
INTRAVENOUS | Status: DC | PRN
Start: 2017-09-09 — End: 2017-09-09
  Administered 2017-09-09: 30 mg via INTRAVENOUS

## 2017-09-09 MED ORDER — PROPOFOL 500 MG/50ML IV EMUL
INTRAVENOUS | Status: DC | PRN
  Administered 2017-09-09: 100 ug/kg/min via INTRAVENOUS

## 2017-09-09 MED ORDER — DILTIAZEM HCL ER COATED BEADS 180 MG PO CP24
180.0000 mg | ORAL_CAPSULE | Freq: Every day | ORAL | Status: DC
  Administered 2017-09-10 – 2017-09-11 (×2): 180 mg via ORAL
  Filled 2017-09-09 (×2): qty 1

## 2017-09-09 MED ORDER — SODIUM CHLORIDE 0.9 % IV SOLN
INTRAVENOUS | Status: DC | PRN
  Administered 2017-09-09: 14:00:00 via INTRAVENOUS

## 2017-09-09 NOTE — Transfer of Care (Signed)
Immediate Anesthesia Transfer of Care Note  Patient: Kayla Kent  Procedure(s) Performed: ESOPHAGOGASTRODUODENOSCOPY (EGD) (N/A ) BALLOON DILATION (N/A )  Patient Location: Endoscopy Unit  Anesthesia Type:MAC  Level of Consciousness: awake and alert   Airway & Oxygen Therapy: Patient Spontanous Breathing and Patient connected to nasal cannula oxygen  Post-op Assessment: Report given to RN and Post -op Vital signs reviewed and stable  Post vital signs: Reviewed and stable  Last Vitals:  Vitals:   09/09/17 1339 09/09/17 1416  BP: (!) 161/56 (!) 105/49  Pulse:  82  Resp: (!) 21 17  Temp: 36.6 C 37.1 C  SpO2: 96% 94%    Last Pain:  Vitals:   09/09/17 1416  TempSrc: Oral  PainSc:          Complications: No apparent anesthesia complications

## 2017-09-09 NOTE — Anesthesia Preprocedure Evaluation (Addendum)
Anesthesia Evaluation  Patient identified by MRN, date of birth, ID band Patient awake    Reviewed: Allergy & Precautions, NPO status , Patient's Chart, lab work & pertinent test results  History of Anesthesia Complications Negative for: history of anesthetic complications  Airway Mallampati: III  TM Distance: >3 FB Neck ROM: Full    Dental no notable dental hx. (+) Dental Advisory Given   Pulmonary former smoker,    Pulmonary exam normal        Cardiovascular hypertension, Pt. on medications + Peripheral Vascular Disease  + dysrhythmias Atrial Fibrillation and Supra Ventricular Tachycardia  Rhythm:Irregular Rate:Normal  ECG: A-fib, rate 88  Sees cardiologist  Probable normal perfusion an mild soft tissue attenuation No significant ischemia. Images not gated due to atrial fibrillation.   ECHO: LV EF: 55% -   60%    Neuro/Psych Seizures -, Well Controlled,  PSYCHIATRIC DISORDERS Anxiety Depression TIA   GI/Hepatic Neg liver ROS, GERD  Controlled, FOBT + stool.  scant bleeding PR and dark stools.   Endo/Other    Renal/GU Renal disease     Musculoskeletal   Abdominal (+)  Abdomen: soft.    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  Reproductive/Obstetrics                            Anesthesia Physical  Anesthesia Plan  ASA: III  Anesthesia Plan: MAC   Post-op Pain Management:    Induction: Intravenous  PONV Risk Score and Plan: 2 and Propofol infusion and Treatment may vary due to age or medical condition  Airway Management Planned: Natural Airway  Additional Equipment:   Intra-op Plan:   Post-operative Plan:   Informed Consent: I have reviewed the patients History and Physical, chart, labs and discussed the procedure including the risks, benefits and alternatives for the proposed anesthesia with the  patient or authorized representative who has indicated his/her understanding and acceptance.   Dental advisory given  Plan Discussed with: CRNA  Anesthesia Plan Comments:         Anesthesia Quick Evaluation

## 2017-09-09 NOTE — Progress Notes (Signed)
PROGRESS NOTE  Kayla Kent ZSW:[id number] DOB: November 13, 1931 DOA: 09/06/2017 PCP: Biagio Borg, MD  HPI/Recap of past 24 hours: HPI On 09/06/2017 by Ms. [name], NP Kayla Kent a 83 y.o.femalewith medical history significant forstage III chronic kidney disease, dyslipidemia, chronic atrial fibrillation on Eliquis, hypertension, seizure disorder on AEDs, and recent FNA of thyroid nodule. EMS was called to the patient's home today after heating and air person discovered the patient on the floor. Upon EMS arrival they noticed a toilet full of dark tarry stools. Patient reportedly vomited red emesis the previous night but states she ate tomato soup. In the ER the patient remains confused although at times can answer some questions appropriately. BP somewhat suboptimal with systolic ranging from 322-025. She was not orthostatic when checked. Stools were heme positive. BUN elevated at 31. AST elevated at 66. Lactic acid elevated at 2.44 and 2.05. White count elevated at 17,600 differential not obtained. Hemoglobin 12.7 she is consistent with her baseline. Patient has abnormal urinalysis and previous urine culture from September revealed ESBL E. coli therefore patient placed on contact isolation. No focal neurological deficits detected from baseline.  Today, pt denied any new complaints, denied any chest pain, SOB, abdominal pain, dizziness, N/V/D/C, fever/chills, signs of active bleeding.  Assessment/Plan: Principal Problem:   Acute upper GI bleed Active Problems:   Paroxysmal atrial fibrillation (HCC)   HTN (hypertension)   Seizure disorder (HCC)   Chronic atrial fibrillation (HCC)   Abnormal urinalysis   Thyroid nodule   CKD (chronic kidney disease), stage III (HCC)   Dyslipidemia   Emphysema of lung (HCC)   Heme positive stool   Pressure injury of skin  Acute symptomatic GI bleed, likely upper Stable -+NSAIDs use, Eliquis for anticoagulation -FOBT + -Hemoglobin  currently stable, 11.1 -Gastroenterology consulted and appreciated- EGD on 09/09/2017. No need for colonoscopy at this time -Continue Protonix IV twice a day -Continue to monitor CBC  Sepsis secondary to ESBL UTI Improving -developed fever, tachycardia, tachypnea, resolving leukocytosis -UA: Many bacteria, 0-5 WBC, trace leukocytes -Complains of dysuria and urinary frequency -In September 2018, patient had ESBL Escherichia coli, which was sensitive to Cipro -Urine culture currently >100K Klebsiella pneumoniae -Blood cultures show no growth to date -Continue IV vanc and meropenem  ??RLL pneumonia: -CXR obtained concerning for RLL pneumonia -Urine strep pneumonia antigen negative -Continue IV AB as above  Essential hypertension -Amlodipine, diltiazem held in the setting of GI bleed and risk of hypotension -Continue to monitor  Chronic atrial fibrillation with periods of RVR -CHADSVASC 4 (based on gender, age, hypertension) -Eliquis held due to GI bleed -Continue telemetry monitoring -Cardiology consulted rec started on amiodarone drip and consider weaning off -replace electrolytes prn   Pulmonary edema/Acute diastolic dysfunction -possibly secondary to tachycardia and IVF -Last echocardiogram 04/08/2017 shows EF 55-60%, PA peak pressure of 20mmHg -CXR showed some pulm edema -Given one dose of IV lasix on 11/21 and 11/22 -discontinued IV fluids -cardiology on board  AKI on Chronic kidney disease, stage III Resolved -Creatinine on admission 1.51 (however GFR within stage III), appears that baseline is 1.07-1.08 -Currently creatinine 1.14, continue to monitor BMP  Confusion/dementia? -Per outpatient documentation, patient has had some memory lapses for the past 1 year -Acute changes possibly due to GI bleeding versus infection -CT head showed atrophy with periventricular small vessel disease. Chronic encephalomalacia of the right temporal lobe. No acute infarct. Prior  infarct head of caudate nucleus on the right. -Patient currently lives alone -Education officer, museum consulted -  PT and OT will need to evaluate patient  Seizure disorder -Continue Keppra -Keppra level pending  Thyroid nodule -Recent FNA of nodule as per pathology -Patient should follow-up with her primary care physician regarding this  Dyslipidemia -Statin held     Code Status: Full  Family Communication: None at bedside  Disposition Plan: Endocoscopy pending, once stable   Consultants:  GI  Cardiology  Procedures:  NOne  Antimicrobials:  IV vancomycin  IV Meropenem  DVT prophylaxis:  SCDs   Objective: Vitals:   09/09/17 0818 09/09/17 0843 09/09/17 1130 09/09/17 1131  BP: 113/60  (!) 146/65 (!) 146/65  Pulse: 63  (!) 102 98  Resp: 17  (!) 21   Temp: 98.1 F (36.7 C)  98.1 F (36.7 C) 98.1 F (36.7 C)  TempSrc: Oral  Oral Oral  SpO2: 95% 97% 95% 99%  Weight:      Height:        Intake/Output Summary (Last 24 hours) at 09/09/2017 1304 Last data filed at 09/09/2017 0300 Gross per 24 hour  Intake 362.67 ml  Output 750 ml  Net -387.33 ml   Filed Weights   09/08/17 0500 09/08/17 1036 09/09/17 0425  Weight: 52.6 kg (116 lb) 48 kg (105 lb 13.1 oz) 49.9 kg (110 lb)    Exam:   General:  NAD, alert, awake, oriented to self, place  Cardiovascular:  S1-S2 present, irrgeular, no added hrt sound  Respiratory: Chest clear bilaterally   Abdomen: Soft, non-tender, non-distended, +BS  Musculoskeletal: No pedal edema   Skin: Normal  Psychiatry: Normal mood   Data Reviewed: CBC: Recent Labs  Lab 09/06/17 1102 09/06/17 1649 09/07/17 0347 09/08/17 0336 09/09/17 0339  WBC 17.6* 22.1* 24.9* 20.5* 15.6*  HGB 12.7 11.7* 11.1* 11.2* 11.1*  HCT 38.8 35.7* 33.2* 34.1* 33.2*  MCV 93.9 94.4 94.3 92.9 91.7  PLT 185 170 173 164 867   Basic Metabolic Panel: Recent Labs  Lab 09/06/17 1102 09/06/17 1649 09/07/17 0347 09/07/17 1620 09/08/17 0336  09/08/17 0716 09/09/17 0339  NA 138  --  139 140 139  --  139  K 3.8  --  4.0 4.6 3.2*  --  3.8  CL 102  --  109 111 106  --  107  CO2 26  --  23 18* 26  --  25  GLUCOSE 106*  --  65 84 91  --  94  BUN 31*  --  22* 22* 19  --  16  CREATININE 1.51*  --  1.07* 1.33* 1.14*  --  0.83  CALCIUM 8.4*  --  7.8* 8.0* 7.5*  --  7.6*  MG  --  1.4*  --  1.7  --  2.0  --    GFR: Estimated Creatinine Clearance: 40.5 mL/min (by C-G formula based on SCr of 0.83 mg/dL). Liver Function Tests: Recent Labs  Lab 09/06/17 1102 09/07/17 0347 09/08/17 0336  AST 66* 110* 109*  ALT 34 48 58*  ALKPHOS 105 86 109  BILITOT 1.2 1.4* 1.2  PROT 5.1* 4.7* 5.3*  ALBUMIN 2.6* 2.2* 2.2*   No results for input(s): LIPASE, AMYLASE in the last 168 hours. No results for input(s): AMMONIA in the last 168 hours. Coagulation Profile: No results for input(s): INR, PROTIME in the last 168 hours. Cardiac Enzymes: No results for input(s): CKTOTAL, CKMB, CKMBINDEX, TROPONINI in the last 168 hours. BNP (last 3 results) Recent Labs    10/22/16 1135  PROBNP 410.0*   HbA1C: No results for  input(s): HGBA1C in the last 72 hours. CBG: No results for input(s): GLUCAP in the last 168 hours. Lipid Profile: No results for input(s): CHOL, HDL, LDLCALC, TRIG, CHOLHDL, LDLDIRECT in the last 72 hours. Thyroid Function Tests: No results for input(s): TSH, T4TOTAL, FREET4, T3FREE, THYROIDAB in the last 72 hours. Anemia Panel: No results for input(s): VITAMINB12, FOLATE, FERRITIN, TIBC, IRON, RETICCTPCT in the last 72 hours. Urine analysis:    Component Value Date/Time   COLORURINE AMBER (A) 09/06/2017 1427   APPEARANCEUR HAZY (A) 09/06/2017 1427   APPEARANCEUR Clear 07/22/2017 1022   LABSPEC 1.020 09/06/2017 1427   PHURINE 5.0 09/06/2017 1427   GLUCOSEU NEGATIVE 09/06/2017 1427   GLUCOSEU NEGATIVE 04/13/2017 0839   HGBUR LARGE (A) 09/06/2017 1427   BILIRUBINUR NEGATIVE 09/06/2017 1427   BILIRUBINUR Negative 07/22/2017  1022   KETONESUR NEGATIVE 09/06/2017 1427   PROTEINUR NEGATIVE 09/06/2017 1427   UROBILINOGEN 0.2 07/04/2017 0930   UROBILINOGEN 0.2 04/13/2017 0839   NITRITE NEGATIVE 09/06/2017 1427   LEUKOCYTESUR TRACE (A) 09/06/2017 1427   LEUKOCYTESUR Negative 07/22/2017 1022   Sepsis Labs: @LABRCNTIP (procalcitonin:4,lacticidven:4)  ) Recent Results (from the past 240 hour(s))  Blood culture (routine x 2)     Status: None (Preliminary result)   Collection Time: 09/06/17 11:44 AM  Result Value Ref Range Status   Specimen Description BLOOD RIGHT ARM UPPER  Final   Special Requests IN PEDIATRIC BOTTLE Blood Culture adequate volume  Final   Culture NO GROWTH 2 DAYS  Final   Report Status PENDING  Incomplete  Blood culture (routine x 2)     Status: None (Preliminary result)   Collection Time: 09/06/17 12:08 PM  Result Value Ref Range Status   Specimen Description BLOOD RIGHT ANTECUBITAL  Final   Special Requests   Final    BOTTLES DRAWN AEROBIC AND ANAEROBIC Blood Culture adequate volume   Culture NO GROWTH 2 DAYS  Final   Report Status PENDING  Incomplete  Urine Culture     Status: Abnormal   Collection Time: 09/06/17  2:27 PM  Result Value Ref Range Status   Specimen Description URINE, RANDOM  Final   Special Requests NONE  Final   Culture (A)  Final    >=100,000 COLONIES/mL KLEBSIELLA PNEUMONIAE Confirmed Extended Spectrum Beta-Lactamase Producer (ESBL).  In bloodstream infections from ESBL organisms, carbapenems are preferred over piperacillin/tazobactam. They are shown to have a lower risk of mortality.    Report Status 09/09/2017 FINAL  Final   Organism ID, Bacteria KLEBSIELLA PNEUMONIAE (A)  Final      Susceptibility   Klebsiella pneumoniae - MIC*    AMPICILLIN >=32 RESISTANT Resistant     CEFAZOLIN >=64 RESISTANT Resistant     CEFTRIAXONE 16 INTERMEDIATE Intermediate     CIPROFLOXACIN 1 SENSITIVE Sensitive     GENTAMICIN <=1 SENSITIVE Sensitive     IMIPENEM <=0.25 SENSITIVE  Sensitive     NITROFURANTOIN 64 INTERMEDIATE Intermediate     TRIMETH/SULFA >=320 RESISTANT Resistant     AMPICILLIN/SULBACTAM >=32 RESISTANT Resistant     PIP/TAZO <=4 SENSITIVE Sensitive     Extended ESBL POSITIVE Resistant     * >=100,000 COLONIES/mL KLEBSIELLA PNEUMONIAE  MRSA PCR Screening     Status: None   Collection Time: 09/06/17  5:15 PM  Result Value Ref Range Status   MRSA by PCR NEGATIVE NEGATIVE Final    Comment:        The GeneXpert MRSA Assay (FDA approved for NASAL specimens only), is one component of a  comprehensive MRSA colonization surveillance program. It is not intended to diagnose MRSA infection nor to guide or monitor treatment for MRSA infections.       Studies: No results found.  Scheduled Meds: . ipratropium-albuterol  3 mL Nebulization TID  . mouth rinse  15 mL Mouth Rinse BID  . oxybutynin  10 mg Oral QHS  . pantoprazole (PROTONIX) IV  40 mg Intravenous Q12H  . sodium chloride flush  3 mL Intravenous Q12H  . traZODone  50 mg Oral QHS    Continuous Infusions: . amiodarone 30 mg/hr (09/09/17 0702)  . diltiazem (CARDIZEM) infusion 5 mg/hr (09/07/17 1605)  . levETIRAcetam Stopped (09/09/17 0435)  . meropenem (MERREM) IV Stopped (09/09/17 0610)  . vancomycin Stopped (09/08/17 2216)     LOS: 3 days     Alma Friendly, MD [facility]   If 7PM-7AM, please contact night-coverage [email or web address] Password [email or web address] 09/09/2017, 1:04 PM

## 2017-09-09 NOTE — Op Note (Signed)
[facility] Patient Name: Kayla Kent Procedure Date : 09/09/2017 MRN: [id number] Attending MD: Mauri Pole , MD Date of Birth: 09/22/1932 CSN: [id number] Age: 83 Admit Type: Inpatient Procedure:                Upper GI endoscopy Indications:              Suspected upper gastrointestinal bleeding, Dysphagia Providers:                Mauri Pole, MD, Carolynn Comment RN, RN,                            William Dalton, Technician Referring MD:              Medicines:                Monitored Anesthesia Care Complications:            No immediate complications. Estimated Blood Loss:     Estimated blood loss was minimal. Procedure:                Pre-Anesthesia Assessment:                           - Prior to the procedure, a History and Physical                            was performed, and patient medications and                            allergies were reviewed. The patient's tolerance of                            previous anesthesia was also reviewed. The risks                            and benefits of the procedure and the sedation                            options and risks were discussed with the patient.                            All questions were answered, and informed consent                            was obtained. Prior Anticoagulants: The patient                            last took Eliquis (apixaban) 3 days prior to the                            procedure. ASA Grade Assessment: III - A patient                            with severe systemic disease. After reviewing the  risks and benefits, the patient was deemed in                            satisfactory condition to undergo the procedure.                           After obtaining informed consent, the endoscope was                            passed under direct vision. Throughout the                            procedure, the patient's blood pressure, pulse,  and                            oxygen saturations were monitored continuously. The                            EG-[id number] ([id number]) scope was introduced through the                            mouth, and advanced to the second part of duodenum.                            The upper GI endoscopy was accomplished without                            difficulty. The patient tolerated the procedure                            well. Scope In: Scope Out: Findings:      The esophagus was normal. Regular Z-line. No gross lesions or visible       stricture      A small hiatal hernia was present.      Patchy mild inflammation characterized by congestion (edema) and       erythema was found in the entire examined stomach. Biopsies were taken       with a cold forceps for Helicobacter pylori testing.      The examined duodenum was normal. Impression:               - Normal esophagus.                           - Small hiatal hernia.                           - Gastritis. Biopsied.                           - Normal examined duodenum. Moderate Sedation:      N/A Recommendation:           - Resume previous diet.                           - Continue present medications.                           -  Resume Eliquis (apixaban) at prior dose today.                            Refer to managing physician for further adjustment                            of therapy.                           - Use Protonix (pantoprazole) 40 mg PO daily for 1                            month.                           - Discussed findings with patient and called her                            daughter Kayla Kent)                           -Will sign off, please call with any questions Procedure Code(s):        --- Professional ---                           [phone], Esophagogastroduodenoscopy, flexible,                            transoral; with biopsy, single or multiple Diagnosis Code(s):        --- Professional ---                            K44.9, Diaphragmatic hernia without obstruction or                            gangrene                           K29.70, Gastritis, unspecified, without bleeding                           R13.10, Dysphagia, unspecified CPT copyright 2016 [employer]. All rights reserved. The codes documented in this report are preliminary and upon coder review may  be revised to meet current compliance requirements. Mauri Pole, MD 09/09/2017 2:18:47 PM This report has been signed electronically. Number of Addenda: 0

## 2017-09-09 NOTE — Progress Notes (Signed)
Progress Note  Patient Name: Kayla Kent Date of Encounter: 09/09/2017  Primary Cardiologist: Harrington Challenger  Subjective   Denies CP or SOB    Inpatient Medications    Scheduled Meds: . [MAR Hold] ipratropium-albuterol  3 mL Nebulization TID  . [MAR Hold] mouth rinse  15 mL Mouth Rinse BID  . [MAR Hold] oxybutynin  10 mg Oral QHS  . [MAR Hold] pantoprazole (PROTONIX) IV  40 mg Intravenous Q12H  . [MAR Hold] sodium chloride flush  3 mL Intravenous Q12H  . [MAR Hold] traZODone  50 mg Oral QHS   Continuous Infusions: . amiodarone 30 mg/hr (09/09/17 0702)  . [MAR Hold] diltiazem (CARDIZEM) infusion 5 mg/hr (09/07/17 1605)  . [MAR Hold] levETIRAcetam Stopped (09/09/17 0435)  . [MAR Hold] meropenem (MERREM) IV Stopped (09/09/17 0610)  . [MAR Hold] vancomycin Stopped (09/08/17 2216)   PRN Meds: [MAR Hold] acetaminophen **OR** [MAR Hold] acetaminophen, [MAR Hold] ALPRAZolam, [MAR Hold] ondansetron **OR** [MAR Hold] ondansetron (ZOFRAN) IV, [MAR Hold] sodium chloride   Vital Signs    Vitals:   09/09/17 0843 09/09/17 1130 09/09/17 1131 09/09/17 1339  BP:  (!) 146/65 (!) 146/65 (!) 161/56  Pulse:  (!) 102 98   Resp:  (!) 21  (!) 21  Temp:  98.1 F (36.7 C) 98.1 F (36.7 C) 97.8 F (36.6 C)  TempSrc:  Oral Oral Oral  SpO2: 97% 95% 99% 96%  Weight:    110 lb (49.9 kg)  Height:    5\' 7"  (1.702 m)    Intake/Output Summary (Last 24 hours) at 09/09/2017 1414 Last data filed at 09/09/2017 0300 Gross per 24 hour  Intake 362.67 ml  Output 750 ml  Net -387.33 ml   Filed Weights   09/08/17 1036 09/09/17 0425 09/09/17 1339  Weight: 105 lb 13.1 oz (48 kg) 110 lb (49.9 kg) 110 lb (49.9 kg)    Telemetry    Afib  90s  - Personally Reviewed  ECG      Physical Exam   GEN: No acute distress.   Neck: No JVD Cardiac:   Irreg irreg  No S3  , no murmurs, rubs, or gallops.  Respiratory: Clear to auscultation bilaterally. GI: Soft, nontender, non-distended  MS: No edema; No  deformity. Neuro:  Nonfocal  Psych: Normal affect   Labs    Chemistry Recent Labs  Lab 09/06/17 1102 09/07/17 0347 09/07/17 1620 09/08/17 0336 09/09/17 0339  NA 138 139 140 139 139  K 3.8 4.0 4.6 3.2* 3.8  CL 102 109 111 106 107  CO2 26 23 18* 26 25  GLUCOSE 106* 65 84 91 94  BUN 31* 22* 22* 19 16  CREATININE 1.51* 1.07* 1.33* 1.14* 0.83  CALCIUM 8.4* 7.8* 8.0* 7.5* 7.6*  PROT 5.1* 4.7*  --  5.3*  --   ALBUMIN 2.6* 2.2*  --  2.2*  --   AST 66* 110*  --  109*  --   ALT 34 48  --  58*  --   ALKPHOS 105 86  --  109  --   BILITOT 1.2 1.4*  --  1.2  --   GFRNONAA 31* 47* 36* 43* >60  GFRAA 36* 54* 42* 50* >60  ANIONGAP 10 7 11 7 7      Hematology Recent Labs  Lab 09/07/17 0347 09/08/17 0336 09/09/17 0339  WBC 24.9* 20.5* 15.6*  RBC 3.52* 3.67* 3.62*  HGB 11.1* 11.2* 11.1*  HCT 33.2* 34.1* 33.2*  MCV 94.3 92.9 91.7  MCH 31.5 30.5 30.7  MCHC 33.4 32.8 33.4  RDW 15.5 15.5 15.5  PLT 173 164 172    Cardiac EnzymesNo results for input(s): TROPONINI in the last 168 hours. No results for input(s): TROPIPOC in the last 168 hours.   BNP Recent Labs  Lab 09/06/17 1649  BNP 722.5*     DDimer No results for input(s): DDIMER in the last 168 hours.   Radiology    Dg Chest Port 1 View  Result Date: 09/07/2017 CLINICAL DATA:  Rapid atrial fibrillation. EXAM: PORTABLE CHEST 1 VIEW COMPARISON:  Chest x-ray from yesterday. FINDINGS: The left costophrenic angle is excluded from the field of view. Stable cardiomegaly and increased interstitial markings. Increased patchy opacities in the right lower lobe. No pneumothorax or large pleural effusion. No acute osseous abnormality. IMPRESSION: 1. Increased patchy opacities in the right lower lobe, concerning for aspiration or pneumonia. 2. Stable cardiomegaly and interstitial pulmonary edema. Electronically Signed   By: Titus Dubin M.D.   On: 09/07/2017 16:21    Cardiac Studies    Patient Profile     83 y.o. female of  stage 3 CKD, HLD, chronic afib on Eliquis, ablation for SVT 2010, HTN, seizure disorder, PVD and hypermetabolic thyroid nodule s/p FNA 10/2018who is being seen today for the evaluation of atrial fibrillation with RVRat the request of Mikhail.    Assessment & Plan    1  Atrial fib  Permanent  Remains in afib Rates better on amio  WOuld keep on this for now until BP better  Was on dlit 180 at home (with low dose amlodipne  Low dose ) as well as Eliquis  Would stop amiodarone  Continue dilt    2  Acute diastolic CHF  Volume status is improved  Watch I/O  For now while in hosp  WIll not plan a long term regimen yet  3  Sepsis  On ABX  4  Melena  Pt getting endoscopy now.    5  Neuro/psych.  Pt with some baseline probable dementia from my interactions with her in clinic   Exacerbated by current illness  For questions or updates, please contact [facility] Please consult [email or web address] for contact info under Cardiology/STEMI.      Signed, Dorris Carnes, MD  09/09/2017, 2:14 PM

## 2017-09-09 NOTE — Interval H&P Note (Signed)
History and Physical Interval Note:  09/09/2017 1:35 PM  Kayla Kent  has presented today for surgery, with the diagnosis of FOBT + stool.  scant bleeding PR and dark stools.  The various methods of treatment have been discussed with the patient and family. After consideration of risks, benefits and other options for treatment, the patient has consented to  Procedure(s): ESOPHAGOGASTRODUODENOSCOPY (EGD) (N/A) BALLOON DILATION (N/A) as a surgical intervention .  The patient's history has been reviewed, patient examined, no change in status, stable for surgery.  I have reviewed the patient's chart and labs.  Questions were answered to the patient's satisfaction.     Toniya Rozar

## 2017-09-10 ENCOUNTER — Encounter (HOSPITAL_COMMUNITY): Payer: Self-pay | Admitting: Gastroenterology

## 2017-09-10 DIAGNOSIS — K921 Melena: Secondary | ICD-10-CM

## 2017-09-10 LAB — CBC WITH DIFFERENTIAL/PLATELET
BASOS PCT: 0 %
Basophils Absolute: 0 10*3/uL (ref 0.0–0.1)
EOS ABS: 0.1 10*3/uL (ref 0.0–0.7)
Eosinophils Relative: 1 %
HCT: 32.7 % — ABNORMAL LOW (ref 36.0–46.0)
HEMOGLOBIN: 10.9 g/dL — AB (ref 12.0–15.0)
LYMPHS ABS: 1.2 10*3/uL (ref 0.7–4.0)
Lymphocytes Relative: 13 %
MCH: 30.4 pg (ref 26.0–34.0)
MCHC: 33.3 g/dL (ref 30.0–36.0)
MCV: 91.3 fL (ref 78.0–100.0)
Monocytes Absolute: 0.9 10*3/uL (ref 0.1–1.0)
Monocytes Relative: 10 %
NEUTROS PCT: 76 %
Neutro Abs: 6.8 10*3/uL (ref 1.7–7.7)
Platelets: 184 10*3/uL (ref 150–400)
RBC: 3.58 MIL/uL — AB (ref 3.87–5.11)
RDW: 15.3 % (ref 11.5–15.5)
WBC: 8.9 10*3/uL (ref 4.0–10.5)

## 2017-09-10 LAB — BASIC METABOLIC PANEL
Anion gap: 6 (ref 5–15)
BUN: 19 mg/dL (ref 6–20)
CHLORIDE: 108 mmol/L (ref 101–111)
CO2: 23 mmol/L (ref 22–32)
Calcium: 7.5 mg/dL — ABNORMAL LOW (ref 8.9–10.3)
Creatinine, Ser: 0.84 mg/dL (ref 0.44–1.00)
GFR calc non Af Amer: >60 mL/min (ref >60)
Glucose, Bld: 82 mg/dL (ref 65–99)
POTASSIUM: 3.9 mmol/L (ref 3.5–5.1)
SODIUM: 137 mmol/L (ref 135–145)

## 2017-09-10 MED ORDER — ACETAMINOPHEN 500 MG PO TABS: 1000.0000 mg | ORAL_TABLET | Freq: Four times a day (QID) | ORAL | Status: DC | PRN

## 2017-09-10 MED ORDER — SENNOSIDES-DOCUSATE SODIUM 8.6-50 MG PO TABS
1.0000 | ORAL_TABLET | Freq: Every day | ORAL | Status: DC
  Administered 2017-09-10: 1 via ORAL
  Filled 2017-09-10: qty 1

## 2017-09-10 MED ORDER — ENSURE ENLIVE PO LIQD
237.0000 mL | Freq: Two times a day (BID) | ORAL | Status: DC
  Administered 2017-09-11: 237 mL via ORAL

## 2017-09-10 MED ORDER — DEXTROSE-NACL 5-0.45 % IV SOLN
INTRAVENOUS | Status: DC
  Administered 2017-09-10 – 2017-09-11 (×2): via INTRAVENOUS
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
## 2017-09-10 NOTE — Progress Notes (Signed)
Patient with fever of 101.3 at . Given tylenol 650mg  PO per PRN parameters for temperature greater than 101. Patient temperature rechecked at 2130 with little to no improvement ( 101.2). MD on call paged. Orders given for increased Tylenol dose of 1G every 6 hours. Will continue to monitor patient closely.   Milford Cage, RN
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
## 2017-09-10 NOTE — Evaluation (Signed)
Occupational Therapy Evaluation Patient Details Name: Kayla Kent MRN: [id number] DOB: 1932/08/14 Today's Date: 09/10/2017    History of Present Illness Pt is an 83 y.o. female who presented to the ED with fall at home, blood in stool, red emesis, and confusion. Found to have heme positive stools, ESBL E. coli in urine culture. S/p ESOPHAGOGASTRODUODENOSCOPY 09/09/17. PMH significant for anxiety, CKD, emphysema of lung, HLD, HTN, intolerance of drugs, paroxysmal supraventricular tachycardia, PVD, and seizure.    Clinical Impression   PTA, pt reports independence with ADL and functional mobility and living alone. Evaluation limited as pt declining to sit at EOB for ADL participation or attempt ADL transfers despite encouragement and education from OT, PT, and MD. Pt becoming agitated at times when asked to complete tasks. She currently requires total assist for ADL; although feel she will demonstrate improved independence as she is more willing to participate. Pt able to initiate rolling in bed and demonstrate functional B UE strength. She was able to follow one-step commands inconsistently. Noted disconjugate gaze, decreased orientation, and poor awareness this session as well. Recommend SNF level rehabilitation to maximize return to PLOF. OT will continue to follow while admitted.     Follow Up Recommendations  SNF;Supervision/Assistance - 24 hour    Equipment Recommendations  Other (comment)(TBD at next venue)    Recommendations for Other Services       Precautions / Restrictions Precautions Precautions: Fall Restrictions Weight Bearing Restrictions: No      Mobility Bed Mobility Overal bed mobility: Needs Assistance Bed Mobility: Rolling           General bed mobility comments: Pt attempted rolling to the L but declined therapist's assistance. Required step-by-step cues to participate.   Transfers                 General transfer comment: Pt agitated and  refusing to sit at EOB or transfer despite max encouragement.     Balance Overall balance assessment: Needs assistance                                         ADL either performed or assessed with clinical judgement   ADL Overall ADL's : Needs assistance/impaired                                       General ADL Comments: Pt currently declining to participate in ADL and requiring total assistance.      Vision Baseline Vision/History: Wears glasses Wears Glasses: Reading only Patient Visual Report: No change from baseline(reports no changes) Vision Assessment?: Yes Eye Alignment: Impaired (comment)(R eye laterally rotated) Ocular Range of Motion: Within Functional Limits Tracking/Visual Pursuits: Impaired - to be further tested in functional context(R eye with difficulty tracking) Additional Comments: Pt with disconjugate gaze and R eye laterally rotated.      Perception     Praxis      Pertinent Vitals/Pain Pain Assessment: (did not report)     Hand Dominance     Extremity/Trunk Assessment Upper Extremity Assessment Upper Extremity Assessment: Overall WFL for tasks assessed   Lower Extremity Assessment Lower Extremity Assessment: Defer to PT evaluation       Communication Communication Communication: No difficulties   Cognition Arousal/Alertness: Awake/alert Behavior During Therapy: Agitated Overall Cognitive Status: Impaired/Different from baseline Area  of Impairment: Attention;Orientation;Following commands;Safety/judgement;Problem solving;Awareness                 Orientation Level: Disoriented to;Place;Situation Current Attention Level: Focused   Following Commands: Follows one step commands with increased time;Follows multi-step commands consistently Safety/Judgement: Decreased awareness of safety;Decreased awareness of deficits Awareness: Intellectual Problem Solving: Slow processing General Comments: Pt  agitated and closing eyes throughout majority of session. Would follow some commands but with increased time and is not consistent.    General Comments  RR up to 30 when becoming agitated.     Exercises     Shoulder Instructions      Home Living Family/patient expects to be discharged to:: Private residence Living Arrangements: Alone Available Help at Discharge: Family                             Additional Comments: Unable to obtain further home set-up information as pt confused and agitated.       Prior Functioning/Environment Level of Independence: Independent        Comments: Reports independence with ADL and home management. Does not use RW per her report.         OT Problem List: Decreased strength;Decreased activity tolerance;Impaired balance (sitting and/or standing);Decreased cognition;Decreased safety awareness;Decreased knowledge of use of DME or AE;Decreased knowledge of precautions;Cardiopulmonary status limiting activity      OT Treatment/Interventions: Self-care/ADL training;Therapeutic exercise;Energy conservation;DME and/or AE instruction;Therapeutic activities;Cognitive remediation/compensation;Patient/family education;Balance training    OT Goals(Current goals can be found in the care plan section) Acute Rehab OT Goals Patient Stated Goal: to rest OT Goal Formulation: With patient Time For Goal Achievement: 09/24/17 Potential to Achieve Goals: Good ADL Goals Pt Will Perform Grooming: with supervision;sitting Pt Will Perform Upper Body Dressing: sitting;with min guard assist Pt Will Perform Lower Body Dressing: with min assist;sit to/from stand Pt Will Transfer to Toilet: stand pivot transfer;with min assist;bedside commode Additional ADL Goal #1: Pt will demonstrate sustained attention to grooming task in a non-distracting environment. Additional ADL Goal #2: Pt will demonstrate emergent awareness during toileting tasks.  OT Frequency: Min  2X/week   Barriers to D/C:            Co-evaluation              AM-PAC PT "6 Clicks" Daily Activity     Outcome Measure Help from another person eating meals?: Total Help from another person taking care of personal grooming?: Total Help from another person toileting, which includes using toliet, bedpan, or urinal?: Total Help from another person bathing (including washing, rinsing, drying)?: Total Help from another person to put on and taking off regular upper body clothing?: Total Help from another person to put on and taking off regular lower body clothing?: Total 6 Click Score: 6   End of Session Equipment Utilized During Treatment: Oxygen Nurse Communication: Mobility status  Activity Tolerance: Patient tolerated treatment well Patient left: in bed;with call bell/phone within reach  OT Visit Diagnosis: Other abnormalities of gait and mobility (R26.89);Muscle weakness (generalized) (M62.81);Other symptoms and signs involving cognitive function                Time: 1140-1158 OT Time Calculation (min): 18 min Charges:  OT General Charges $OT Visit: 1 Visit OT Evaluation $OT Eval Moderate Complexity: 1 Mod G-Codes:     Norman Herrlich, MS OTR/L  Pager: [place] A Ayn Domangue 09/10/2017, 1:28 PM

## 2017-09-10 NOTE — Progress Notes (Signed)
PROGRESS NOTE  Kayla Kent NID:[id number] DOB: Aug 28, 1932 DOA: 09/06/2017 PCP: Biagio Borg, MD  HPI/Recap of past 24 hours: HPI On 09/06/2017 by Ms. [name], NP Samuel Jester a 83 y.o.femalewith medical history significant forstage III chronic kidney disease, dyslipidemia, chronic atrial fibrillation on Eliquis, hypertension, seizure disorder on AEDs, and recent FNA of thyroid nodule. EMS was called to the patient's home today after heating and air person discovered the patient on the floor. Upon EMS arrival they noticed a toilet full of dark tarry stools. Patient reportedly vomited red emesis the previous night but states she ate tomato soup. In the ER the patient remains confused although at times can answer some questions appropriately. BP somewhat suboptimal with systolic ranging from 144-315. She was not orthostatic when checked. Stools were heme positive. BUN elevated at 31. AST elevated at 66. Lactic acid elevated at 2.44 and 2.05. White count elevated at 17,600 differential not obtained. Hemoglobin 12.7 she is consistent with her baseline. Patient has abnormal urinalysis and previous urine culture from September revealed ESBL E. coli therefore patient placed on contact isolation. No focal neurological deficits detected from baseline.  Today, pt noted to be more sleepy, but easily arousable stating "I just want to get some sleep", able to follow commands. Noted to have a poor appetite. Denied any new complaints, denied any chest pain, SOB, abdominal pain, dizziness, N/V/D/C, fever/chills, signs of active bleeding.  Assessment/Plan: Principal Problem:   Acute upper GI bleed Active Problems:   Paroxysmal atrial fibrillation (HCC)   HTN (hypertension)   Seizure disorder (HCC)   Chronic atrial fibrillation (HCC)   Abnormal urinalysis   Thyroid nodule   CKD (chronic kidney disease), stage III (HCC)   Dyslipidemia   Emphysema of lung (HCC)   Heme positive stool  Pressure injury of skin   Melena  Acute symptomatic GI bleed, likely from gastritis Stable - Hx of +NSAIDs use, Eliquis for anticoagulation -FOBT + -Hemoglobin currently stable post EGD -EGD done 11/23: Normal esophagus, no stricture.  Small hiatal hernia present.  Patchy mild inflammation found in the entire stomach, biopsies taken for H. pylori testing.  Normal duodenum -Gastroenterology consulted and appreciated-resume diet, resume Eliquis, continue Protonix 40 mg daily for 1 month. No need for colonoscopy at this time -Continue Protonix 40mg  daily -Continue to monitor CBC  Sepsis secondary to ESBL UTI Improving -Last temp spike of 101 on 11/23 -developed fever, tachycardia, tachypnea, resolving leukocytosis on admission -UA: Many bacteria, 0-5 WBC, trace leukocytes -Complains of dysuria and urinary frequency -In September 2018, patient had ESBL Escherichia coli, which was sensitive to Cipro -Urine culture currently >100K Klebsiella pneumoniae -Blood cultures show no growth to date -Continue IV vanc and meropenem  ??RLL pneumonia: -CXR obtained concerning for RLL pneumonia -Urine strep pneumonia antigen negative -Continue IV AB as above  Essential hypertension -Held amlodipine, restarted diltiazem -Continue to monitor  Chronic atrial fibrillation with periods of RVR -CHADSVASC 4 (based on gender, age, hypertension) -Restart Eliquis, ok with GI -Continue telemetry monitoring -Cardiology consulted rec- starting PO diltiazem 180mg  daily -replace electrolytes prn   Pulmonary edema/Acute diastolic dysfunction -possibly secondary to tachycardia and IVF -Last echocardiogram 04/08/2017 shows EF 55-60%, PA peak pressure of 58mmHg -CXR showed some pulm edema -Given one dose of IV lasix on 11/21 and 11/22 -Restarted IV fluids due to poor oral intake at 50cc/hr -cardiology on board  Failure to thrive/Poor oral intake Nutrition consult Start gentle hydration with IVF @  50cc/hr  AKI on Chronic  kidney disease, stage III Resolved -Creatinine on admission 1.51 (however GFR within stage III), appears that baseline is 1.07-1.08 -Currently creatinine 1.14, continue to monitor BMP  Confusion/dementia? -Per outpatient documentation, patient has had some memory lapses for the past 1 year -Acute changes possibly due to GI bleeding versus infection -CT head showed atrophy with periventricular small vessel disease. Chronic encephalomalacia of the right temporal lobe. No acute infarct. 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 -Patient currently lives alone -Education officer, museum consulted -PT and OT consult placed  Seizure disorder -Continue Keppra -Keppra level pending  Thyroid nodule -Recent FNA of nodule as per pathology -Patient should follow-up with her primary care physician regarding this  Dyslipidemia -Statin held     Code Status: Full  Family Communication: None at bedside  Disposition Plan: Pending PT evaluation   Consultants:  GI  Cardiology  Procedures:  None  Antimicrobials:  IV vancomycin  IV Meropenem  DVT prophylaxis:  Eliquis   Objective: Vitals:   09/10/17 0912 09/10/17 1000 09/10/17 1057 09/10/17 1100  BP:  (!) 150/58 (!) 150/58 (!) 142/56  Pulse:  (!) 109  (!) 106  Resp:  (!) 24  (!) 23  Temp:      TempSrc:      SpO2: 93% 90%  91%  Weight:      Height:        Intake/Output Summary (Last 24 hours) at 09/10/2017 1158 Last data filed at 09/10/2017 1151 Gross per 24 hour  Intake 870 ml  Output 250 ml  Net 620 ml   Filed Weights   09/09/17 0425 09/09/17 1339 09/10/17 0401  Weight: 49.9 kg (110 lb) 49.9 kg (110 lb) 51.7 kg (114 lb)    Exam:   General:  NAD, alert, sleepy, but easily arousable, oriented to self, place  Cardiovascular:  S1-S2 present, irrgeular, no added hrt sound  Respiratory: Chest clear bilaterally   Abdomen: Soft, non-tender, non-distended, +BS  Musculoskeletal:  No pedal edema   Skin: Normal  Psychiatry: Normal mood   Data Reviewed: CBC: Recent Labs  Lab 09/06/17 1649 09/07/17 0347 09/08/17 0336 09/09/17 0339 09/10/17 0308  WBC 22.1* 24.9* 20.5* 15.6* 8.9  NEUTROABS  --   --   --   --  6.8  HGB 11.7* 11.1* 11.2* 11.1* 10.9*  HCT 35.7* 33.2* 34.1* 33.2* 32.7*  MCV 94.4 94.3 92.9 91.7 91.3  PLT 170 173 164 172 627   Basic Metabolic Panel: Recent Labs  Lab 09/06/17 1649 09/07/17 0347 09/07/17 1620 09/08/17 0336 09/08/17 0716 09/09/17 0339 09/10/17 0308  NA  --  139 140 139  --  139 137  K  --  4.0 4.6 3.2*  --  3.8 3.9  CL  --  109 111 106  --  107 108  CO2  --  23 18* 26  --  25 23  GLUCOSE  --  65 84 91  --  94 82  BUN  --  22* 22* 19  --  16 19  CREATININE  --  1.07* 1.33* 1.14*  --  0.83 0.84  CALCIUM  --  7.8* 8.0* 7.5*  --  7.6* 7.5*  MG 1.4*  --  1.7  --  2.0  --   --    GFR: Estimated Creatinine Clearance: 41.4 mL/min (by C-G formula based on SCr of 0.84 mg/dL). Liver Function Tests: Recent Labs  Lab 09/06/17 1102 09/07/17 0347 09/08/17 0336  AST 66* 110* 109*  ALT 34 48  58*  ALKPHOS 105 86 109  BILITOT 1.2 1.4* 1.2  PROT 5.1* 4.7* 5.3*  ALBUMIN 2.6* 2.2* 2.2*   No results for input(s): LIPASE, AMYLASE in the last 168 hours. No results for input(s): AMMONIA in the last 168 hours. Coagulation Profile: No results for input(s): INR, PROTIME in the last 168 hours. Cardiac Enzymes: No results for input(s): CKTOTAL, CKMB, CKMBINDEX, TROPONINI in the last 168 hours. BNP (last 3 results) Recent Labs    10/22/16 1135  PROBNP 410.0*   HbA1C: No results for input(s): HGBA1C in the last 72 hours. CBG: No results for input(s): GLUCAP in the last 168 hours. Lipid Profile: No results for input(s): CHOL, HDL, LDLCALC, TRIG, CHOLHDL, LDLDIRECT in the last 72 hours. Thyroid Function Tests: No results for input(s): TSH, T4TOTAL, FREET4, T3FREE, THYROIDAB in the last 72 hours. Anemia Panel: No results for  input(s): VITAMINB12, FOLATE, FERRITIN, TIBC, IRON, RETICCTPCT in the last 72 hours. Urine analysis:    Component Value Date/Time   COLORURINE AMBER (A) 09/06/2017 1427   APPEARANCEUR HAZY (A) 09/06/2017 1427   APPEARANCEUR Clear 07/22/2017 1022   LABSPEC 1.020 09/06/2017 1427   PHURINE 5.0 09/06/2017 1427   GLUCOSEU NEGATIVE 09/06/2017 1427   GLUCOSEU NEGATIVE 04/13/2017 0839   HGBUR LARGE (A) 09/06/2017 1427   BILIRUBINUR NEGATIVE 09/06/2017 1427   BILIRUBINUR Negative 07/22/2017 1022   KETONESUR NEGATIVE 09/06/2017 1427   PROTEINUR NEGATIVE 09/06/2017 1427   UROBILINOGEN 0.2 07/04/2017 0930   UROBILINOGEN 0.2 04/13/2017 0839   NITRITE NEGATIVE 09/06/2017 1427   LEUKOCYTESUR TRACE (A) 09/06/2017 1427   LEUKOCYTESUR Negative 07/22/2017 1022   Sepsis Labs: @LABRCNTIP (procalcitonin:4,lacticidven:4)  ) Recent Results (from the past 240 hour(s))  Blood culture (routine x 2)     Status: None (Preliminary result)   Collection Time: 09/06/17 11:44 AM  Result Value Ref Range Status   Specimen Description BLOOD RIGHT ARM UPPER  Final   Special Requests IN PEDIATRIC BOTTLE Blood Culture adequate volume  Final   Culture NO GROWTH 3 DAYS  Final   Report Status PENDING  Incomplete  Blood culture (routine x 2)     Status: None (Preliminary result)   Collection Time: 09/06/17 12:08 PM  Result Value Ref Range Status   Specimen Description BLOOD RIGHT ANTECUBITAL  Final   Special Requests   Final    BOTTLES DRAWN AEROBIC AND ANAEROBIC Blood Culture adequate volume   Culture NO GROWTH 3 DAYS  Final   Report Status PENDING  Incomplete  Urine Culture     Status: Abnormal   Collection Time: 09/06/17  2:27 PM  Result Value Ref Range Status   Specimen Description URINE, RANDOM  Final   Special Requests NONE  Final   Culture (A)  Final    >=100,000 COLONIES/mL KLEBSIELLA PNEUMONIAE Confirmed Extended Spectrum Beta-Lactamase Producer (ESBL).  In bloodstream infections from ESBL organisms,  carbapenems are preferred over piperacillin/tazobactam. They are shown to have a lower risk of mortality.    Report Status 09/09/2017 FINAL  Final   Organism ID, Bacteria KLEBSIELLA PNEUMONIAE (A)  Final      Susceptibility   Klebsiella pneumoniae - MIC*    AMPICILLIN >=32 RESISTANT Resistant     CEFAZOLIN >=64 RESISTANT Resistant     CEFTRIAXONE 16 INTERMEDIATE Intermediate     CIPROFLOXACIN 1 SENSITIVE Sensitive     GENTAMICIN <=1 SENSITIVE Sensitive     IMIPENEM <=0.25 SENSITIVE Sensitive     NITROFURANTOIN 64 INTERMEDIATE Intermediate     TRIMETH/SULFA >=320 RESISTANT  Resistant     AMPICILLIN/SULBACTAM >=32 RESISTANT Resistant     PIP/TAZO <=4 SENSITIVE Sensitive     Extended ESBL POSITIVE Resistant     * >=100,000 COLONIES/mL KLEBSIELLA PNEUMONIAE  MRSA PCR Screening     Status: None   Collection Time: 09/06/17  5:15 PM  Result Value Ref Range Status   MRSA by PCR NEGATIVE NEGATIVE Final    Comment:        The GeneXpert MRSA Assay (FDA approved for NASAL specimens only), is one component of a comprehensive MRSA colonization surveillance program. It is not intended to diagnose MRSA infection nor to guide or monitor treatment for MRSA infections.       Studies: No results found.  Scheduled Meds: . apixaban  2.5 mg Oral BID  . diltiazem  180 mg Oral Daily  . ipratropium-albuterol  3 mL Nebulization TID  . mouth rinse  15 mL Mouth Rinse BID  . oxybutynin  10 mg Oral QHS  . pantoprazole  40 mg Oral Daily  . senna-docusate  1 tablet Oral QHS  . sodium chloride flush  3 mL Intravenous Q12H  . traZODone  50 mg Oral QHS    Continuous Infusions: . dextrose 5 % and 0.45% NaCl 50 mL/hr at 09/10/17 1015  . levETIRAcetam Stopped (09/10/17 0415)  . meropenem (MERREM) IV Stopped (09/10/17 0536)  . vancomycin Stopped (09/09/17 2125)     LOS: 4 days     Alma Friendly, MD [facility]   If 7PM-7AM, please contact night-coverage [email or web address] Password  [facility] 09/10/2017, 11:58 AM

## 2017-09-10 NOTE — Evaluation (Signed)
Physical Therapy Evaluation Patient Details Name: Kayla Kent MRN: [id number] DOB: 05-23-1932 Today's Date: 09/10/2017   History of Present Illness  Pt is an 83 y.o. female who presented to the ED with fall at home, blood in stool, red emesis, and confusion. Found to have heme positive stools, ESBL E. coli in urine culture. S/p ESOPHAGOGASTRODUODENOSCOPY 09/09/17. PMH significant for anxiety, CKD, emphysema of lung, HLD, HTN, intolerance of drugs, paroxysmal supraventricular tachycardia, PVD, and seizure.   Clinical Impression  Pt presented supine in bed with HOB elevated, awake and initially willing to participate in therapy session but became very agitated and refusing OOB mobility despite max encouragement from PT, OT and MD. Therefore, evaluation was very limited. Prior to admission, pt reported that she ambulated independently and was independent with all ADLs. Per RN, pt lives alone. Pt only willing to perform bed mobility (roll towards her L side x1). Pt becoming very agitated and stated "You are just pushing me too much now" when therapist encouraged further mobility. Pt would continue to benefit from skilled physical therapy services at this time while admitted and after d/c to address the below listed limitations in order to improve overall safety and independence with functional mobility.     Follow Up Recommendations SNF;Supervision/Assistance - 24 hour    Equipment Recommendations  None recommended by PT    Recommendations for Other Services       Precautions / Restrictions Precautions Precautions: Fall Restrictions Weight Bearing Restrictions: No      Mobility  Bed Mobility Overal bed mobility: Needs Assistance Bed Mobility: Rolling           General bed mobility comments: Pt attempted rolling to the L but declined therapist's assistance. Required step-by-step cues to participate. Pt becoming very frustrated and agitated, stating "You are just pushing me too much  now" when therapist requested pt roll towards her R side  Transfers                 General transfer comment: Pt agitated and refusing to sit at EOB or transfer despite max encouragement.   Ambulation/Gait                Stairs            Wheelchair Mobility    Modified Rankin (Stroke Patients Only)       Balance Overall balance assessment: Needs assistance                                           Pertinent Vitals/Pain Pain Assessment: Faces Faces Pain Scale: No hurt    Home Living Family/patient expects to be discharged to:: Private residence Living Arrangements: Alone Available Help at Discharge: Family             Additional Comments: Unable to obtain further home set-up information as pt confused and agitated.     Prior Function Level of Independence: Independent         Comments: Reports independence with ADL and home management. Does not use RW per her report.      Hand Dominance        Extremity/Trunk Assessment   Upper Extremity Assessment Upper Extremity Assessment: Defer to OT evaluation    Lower Extremity Assessment Lower Extremity Assessment: Generalized weakness;Difficult to assess due to impaired cognition       Communication   Communication: No difficulties  Cognition Arousal/Alertness: Awake/alert Behavior During Therapy: Agitated Overall Cognitive Status: Impaired/Different from baseline Area of Impairment: Attention;Orientation;Following commands;Safety/judgement;Problem solving;Awareness                 Orientation Level: Disoriented to;Place;Situation Current Attention Level: Focused   Following Commands: Follows one step commands with increased time;Follows multi-step commands consistently Safety/Judgement: Decreased awareness of safety;Decreased awareness of deficits Awareness: Intellectual Problem Solving: Slow processing General Comments: Pt agitated and closing eyes  throughout majority of session. Would follow some commands but with increased time and is not consistent (mostly because she was refusing to do it, not because of lack of understanding)      General Comments General comments (skin integrity, edema, etc.): Of note, pt's RR increased to 30 when pt became agitated    Exercises     Assessment/Plan    PT Assessment Patient needs continued PT services  PT Problem List Decreased strength;Decreased activity tolerance;Decreased balance;Decreased mobility;Decreased coordination;Decreased cognition;Decreased knowledge of use of DME;Decreased safety awareness;Cardiopulmonary status limiting activity;Decreased knowledge of precautions       PT Treatment Interventions DME instruction;Gait training;Stair training;Functional mobility training;Therapeutic activities;Therapeutic exercise;Balance training;Neuromuscular re-education;Cognitive remediation;Patient/family education    PT Goals (Current goals can be found in the Care Plan section)  Acute Rehab PT Goals Patient Stated Goal: to rest PT Goal Formulation: Patient unable to participate in goal setting Time For Goal Achievement: 09/24/17 Potential to Achieve Goals: Fair    Frequency Min 2X/week   Barriers to discharge Decreased caregiver support      Co-evaluation PT/OT/SLP Co-Evaluation/Treatment: Yes Reason for Co-Treatment: Necessary to address cognition/behavior during functional activity;For patient/therapist safety;To address functional/ADL transfers PT goals addressed during session: Mobility/safety with mobility         AM-PAC PT "6 Clicks" Daily Activity  Outcome Measure Difficulty turning over in bed (including adjusting bedclothes, sheets and blankets)?: Unable Difficulty moving from lying on back to sitting on the side of the bed? : Unable Difficulty sitting down on and standing up from a chair with arms (e.g., wheelchair, bedside commode, etc,.)?: Unable Help needed moving  to and from a bed to chair (including a wheelchair)?: Total Help needed walking in hospital room?: Total Help needed climbing 3-5 steps with a railing? : Total 6 Click Score: 6    End of Session Equipment Utilized During Treatment: Oxygen Activity Tolerance: Treatment limited secondary to agitation Patient left: in bed;with call bell/phone within reach Nurse Communication: Mobility status PT Visit Diagnosis: Other abnormalities of gait and mobility (R26.89);Muscle weakness (generalized) (M62.81)    Time: 2449-7530 PT Time Calculation (min) (ACUTE ONLY): 21 min   Charges:   PT Evaluation $PT Eval Moderate Complexity: 1 Mod     PT G Codes:        Adrian, PT, DPT [phone]   [place] 09/10/2017, 3:48 PM

## 2017-09-10 NOTE — Progress Notes (Signed)
Pharmacy Antibiotic Note  Kayla Kent is a 83 y.o. female admitted on 09/06/2017 with sepsis.   Continues on Vancomycin / Meropenem - Day # 4 WBC = 8.9, Tmax = 101, Scr stable  Blood cultures negative to date Urine culture with ESBL Klebsiella  Plan: Continue Meropenem 1g IV q12h Continue Vancomycin 1g IV x1, then 500mg  IV q24h - consider stopping? Follow c/s, clinical progression, de-esc/LOT, renal function, level PRN  Height: 5\' 7"  (170.2 cm) Weight: 114 lb (51.7 kg) IBW/kg (Calculated) : 61.6  Temp (24hrs), Avg:98.6 F (37 C), Min:97.8 F (36.6 C), Max:101 F (38.3 C)  Recent Labs  Lab 09/06/17 1230 09/06/17 1413 09/06/17 1649 09/06/17 1911 09/07/17 0347 09/07/17 1620 09/08/17 0336 09/09/17 0339 09/10/17 0308  WBC  --   --  22.1*  --  24.9*  --  20.5* 15.6* 8.9  CREATININE  --   --   --   --  1.07* 1.33* 1.14* 0.83 0.84  LATICACIDVEN 2.44* 2.05*  --  3.7* 1.7  --   --   --   --     Estimated Creatinine Clearance: 41.4 mL/min (by C-G formula based on SCr of 0.84 mg/dL).    Allergies  Allergen Reactions  . Chocolate Hives  . Codeine Other (See Comments)    Patient states she acts crazy  . Diazepam Other (See Comments)    Patient states she acts crazy.  . Fruit & Vegetable Daily [Nutritional Supplements] Hives and Swelling    peaches  . Iohexol Hives     Code: HIVES, Desc: pt gets 13 hr pre-meds, Onset Date: [id number]   . Latex Hives  . Peach [Prunus Persica] Hives  . Peanut-Containing Drug Products Hives and Swelling  . Penicillins Hives, Itching and Swelling    Has patient had a PCN reaction causing immediate rash, facial/tongue/throat swelling, SOB or lightheadedness with hypotension: Yes Has patient had a PCN reaction causing severe rash involving mucus membranes or skin necrosis: No Has patient had a PCN reaction that required hospitalization No Has patient had a PCN reaction occurring within the last 10 years: No If all of the above answers are  "NO", then may proceed with Cephalosporin use.   . Strawberry Extract Hives  . Sulfonamide Derivatives Hives    nausea  . Wheat Shortness Of Breath    Shortness of breath  . Wheat Bran Shortness Of Breath  . Sulfamethoxazole Hives  . Crestor [Rosuvastatin Calcium] Rash  . Iodine Rash    Cipro 11/20>>11/21 Meropenem 11/21>> Vancomycin 11/21>>  11/20 Blood: ngtd 11/20 Urine: >100K kleb pneumo 11/20 MRSA PCR: neg   Thank you for allowing pharmacy to be a part of this patient's care. Anette Guarneri, PharmD [phone]  09/10/2017 11:24 AM

## 2017-09-11 ENCOUNTER — Inpatient Hospital Stay (HOSPITAL_COMMUNITY): Payer: Medicare Other | Admitting: Anesthesiology

## 2017-09-11 ENCOUNTER — Inpatient Hospital Stay (HOSPITAL_COMMUNITY): Payer: Medicare Other

## 2017-09-11 ENCOUNTER — Encounter (HOSPITAL_COMMUNITY)
Admission: EM | Disposition: A | Discharge status: Rehabilitation Facility | Payer: Self-pay | Source: Home / Self Care | Attending: Internal Medicine

## 2017-09-11 ENCOUNTER — Encounter (HOSPITAL_COMMUNITY): Payer: Self-pay | Admitting: Anesthesiology

## 2017-09-11 DIAGNOSIS — J439 Emphysema, unspecified: Secondary | ICD-10-CM

## 2017-09-11 DIAGNOSIS — J690 Pneumonitis due to inhalation of food and vomit: Secondary | ICD-10-CM

## 2017-09-11 DIAGNOSIS — N183 Chronic kidney disease, stage 3 (moderate): Secondary | ICD-10-CM

## 2017-09-11 DIAGNOSIS — J9601 Acute respiratory failure with hypoxia: Secondary | ICD-10-CM | POA: Diagnosis present

## 2017-09-11 DIAGNOSIS — J189 Pneumonia, unspecified organism: Secondary | ICD-10-CM

## 2017-09-11 HISTORY — PX: RADIOLOGY WITH ANESTHESIA: SHX6223

## 2017-09-11 HISTORY — PX: IR PERCUTANEOUS ART THROMBECTOMY/INFUSION INTRACRANIAL INC DIAG ANGIO: IMG6087

## 2017-09-11 LAB — BASIC METABOLIC PANEL
ANION GAP: 9 (ref 5–15)
BUN: 20 mg/dL (ref 6–20)
CHLORIDE: 104 mmol/L (ref 101–111)
CO2: 22 mmol/L (ref 22–32)
Calcium: 7.5 mg/dL — ABNORMAL LOW (ref 8.9–10.3)
Creatinine, Ser: 0.85 mg/dL (ref 0.44–1.00)
GFR calc Af Amer: >60 mL/min (ref >60)
GLUCOSE: 119 mg/dL — AB (ref 65–99)
POTASSIUM: 4 mmol/L (ref 3.5–5.1)
Sodium: 135 mmol/L (ref 135–145)

## 2017-09-11 LAB — CBC WITH DIFFERENTIAL/PLATELET
BASOS ABS: 0 10*3/uL (ref 0.0–0.1)
Basophils Relative: 0 %
EOS PCT: 0 %
Eosinophils Absolute: 0 10*3/uL (ref 0.0–0.7)
HEMATOCRIT: 36.7 % (ref 36.0–46.0)
HEMOGLOBIN: 12.5 g/dL (ref 12.0–15.0)
LYMPHS ABS: 1.4 10*3/uL (ref 0.7–4.0)
LYMPHS PCT: 7 %
MCH: 30.9 pg (ref 26.0–34.0)
MCHC: 34.1 g/dL (ref 30.0–36.0)
MCV: 90.8 fL (ref 78.0–100.0)
Monocytes Absolute: 1.4 10*3/uL — ABNORMAL HIGH (ref 0.1–1.0)
Monocytes Relative: 8 %
NEUTROS ABS: 15.7 10*3/uL — AB (ref 1.7–7.7)
NEUTROS PCT: 85 %
PLATELETS: 197 10*3/uL (ref 150–400)
RBC: 4.04 MIL/uL (ref 3.87–5.11)
RDW: 15.2 % (ref 11.5–15.5)
WBC: 18.6 10*3/uL — AB (ref 4.0–10.5)

## 2017-09-11 LAB — RESPIRATORY PANEL BY PCR
ADENOVIRUS-RVPPCR: NOT DETECTED
Bordetella pertussis: NOT DETECTED
CORONAVIRUS 229E-RVPPCR: NOT DETECTED
CORONAVIRUS NL63-RVPPCR: NOT DETECTED
CORONAVIRUS OC43-RVPPCR: NOT DETECTED
Chlamydophila pneumoniae: NOT DETECTED
Coronavirus HKU1: NOT DETECTED
INFLUENZA B-RVPPCR: NOT DETECTED
Influenza A: NOT DETECTED
MYCOPLASMA PNEUMONIAE-RVPPCR: NOT DETECTED
Metapneumovirus: NOT DETECTED
PARAINFLUENZA VIRUS 1-RVPPCR: NOT DETECTED
Parainfluenza Virus 2: NOT DETECTED
Parainfluenza Virus 3: NOT DETECTED
Parainfluenza Virus 4: NOT DETECTED
Respiratory Syncytial Virus: NOT DETECTED
Rhinovirus / Enterovirus: NOT DETECTED

## 2017-09-11 LAB — LACTIC ACID, PLASMA
LACTIC ACID, VENOUS: 1.4 mmol/L (ref 0.5–1.9)
Lactic Acid, Venous: 2.4 mmol/L (ref 0.5–1.9)

## 2017-09-11 LAB — URINALYSIS, ROUTINE W REFLEX MICROSCOPIC
Bacteria, UA: NONE SEEN
Bilirubin Urine: NEGATIVE
GLUCOSE, UA: NEGATIVE mg/dL
Ketones, ur: NEGATIVE mg/dL
Leukocytes, UA: NEGATIVE
NITRITE: NEGATIVE
Protein, ur: NEGATIVE mg/dL
SPECIFIC GRAVITY, URINE: 1.005 (ref 1.005–1.030)
WBC, UA: NONE SEEN WBC/hpf (ref 0–5)
pH: 5 (ref 5.0–8.0)

## 2017-09-11 LAB — CULTURE, BLOOD (ROUTINE X 2)
CULTURE: NO GROWTH
CULTURE: NO GROWTH
SPECIAL REQUESTS: ADEQUATE
Special Requests: ADEQUATE

## 2017-09-11 LAB — PROTIME-INR
INR: 1.22
Prothrombin Time: 15.3 seconds — ABNORMAL HIGH (ref 11.4–15.2)

## 2017-09-11 LAB — APTT: APTT: 36 s (ref 24–36)

## 2017-09-11 LAB — PROCALCITONIN: PROCALCITONIN: 2.17 ng/mL

## 2017-09-11 SURGERY — RADIOLOGY WITH ANESTHESIA
Anesthesia: General

## 2017-09-11 MED ORDER — EPTIFIBATIDE 20 MG/10ML IV SOLN
INTRAVENOUS | Status: AC
  Filled 2017-09-11: qty 10

## 2017-09-11 MED ORDER — PROPOFOL 1000 MG/100ML IV EMUL
0.0000 ug/kg/min | INTRAVENOUS | Status: DC
  Administered 2017-09-12 (×2): 20 ug/kg/min via INTRAVENOUS
  Administered 2017-09-13: 10 ug/kg/min via INTRAVENOUS
  Filled 2017-09-11 (×2): qty 100

## 2017-09-11 MED ORDER — ONDANSETRON HCL 4 MG/2ML IJ SOLN: 4.0000 mg | Freq: Four times a day (QID) | INTRAMUSCULAR | Status: DC | PRN

## 2017-09-11 MED ORDER — DIPHENHYDRAMINE HCL 50 MG/ML IJ SOLN
50.0000 mg | Freq: Once | INTRAMUSCULAR | Status: AC
  Administered 2017-09-11: 50 mg via INTRAVENOUS
  Filled 2017-09-11: qty 1

## 2017-09-11 MED ORDER — VANCOMYCIN HCL IN DEXTROSE 750-5 MG/150ML-% IV SOLN
750.0000 mg | INTRAVENOUS | Status: DC
  Filled 2017-09-11: qty 150

## 2017-09-11 MED ORDER — LINEZOLID 600 MG/300ML IV SOLN
600.0000 mg | Freq: Two times a day (BID) | INTRAVENOUS | Status: DC
  Administered 2017-09-11 – 2017-09-13 (×5): 600 mg via INTRAVENOUS
  Filled 2017-09-11 (×7): qty 300

## 2017-09-11 MED ORDER — HYDROCORTISONE NA SUCCINATE PF 250 MG IJ SOLR
200.0000 mg | Freq: Once | INTRAMUSCULAR | Status: AC
  Administered 2017-09-11: 200 mg via INTRAVENOUS
  Filled 2017-09-11: qty 200

## 2017-09-11 MED ORDER — FENTANYL CITRATE (PF) 100 MCG/2ML IJ SOLN: 50.0000 ug | INTRAMUSCULAR | Status: DC | PRN

## 2017-09-11 MED ORDER — SODIUM CHLORIDE 0.9 % IV SOLN: 250.0000 mL | INTRAVENOUS | Status: DC | PRN

## 2017-09-11 MED ORDER — DIPHENHYDRAMINE HCL 50 MG/ML IJ SOLN
50.0000 mg | Freq: Once | INTRAMUSCULAR | Status: DC
  Administered 2017-09-11: 50 mg via INTRAVENOUS

## 2017-09-11 MED ORDER — VANCOMYCIN HCL IN DEXTROSE 1-5 GM/200ML-% IV SOLN
INTRAVENOUS | Status: AC
  Filled 2017-09-11: qty 200

## 2017-09-11 MED ORDER — LIDOCAINE HCL (CARDIAC) 20 MG/ML IV SOLN
INTRAVENOUS | Status: DC | PRN
  Administered 2017-09-11: 60 mg via INTRATRACHEAL

## 2017-09-11 MED ORDER — NITROGLYCERIN 1 MG/10 ML FOR IR/CATH LAB
INTRA_ARTERIAL | Status: AC | PRN
  Administered 2017-09-11 (×4): 25 ug via INTRA_ARTERIAL

## 2017-09-11 MED ORDER — NICARDIPINE HCL IN NACL 20-0.86 MG/200ML-% IV SOLN
0.0000 mg/h | INTRAVENOUS | Status: DC
  Administered 2017-09-12: 3 mg/h via INTRAVENOUS
  Administered 2017-09-12: 5 mg/h via INTRAVENOUS
  Filled 2017-09-11 (×2): qty 200

## 2017-09-11 MED ORDER — EPTIFIBATIDE 20 MG/10ML IV SOLN
INTRAVENOUS | Status: AC | PRN
  Administered 2017-09-11 (×3): 1.5 mg

## 2017-09-11 MED ORDER — PANTOPRAZOLE SODIUM 40 MG IV SOLR
40.0000 mg | INTRAVENOUS | Status: DC
  Administered 2017-09-12 – 2017-09-16 (×5): 40 mg via INTRAVENOUS
  Filled 2017-09-11 (×5): qty 40

## 2017-09-11 MED ORDER — SODIUM CHLORIDE 0.9 % IV SOLN
INTRAVENOUS | Status: DC | PRN
  Administered 2017-09-11: 20:00:00 via INTRAVENOUS

## 2017-09-11 MED ORDER — FUROSEMIDE 10 MG/ML IJ SOLN
40.0000 mg | Freq: Once | INTRAMUSCULAR | Status: AC
  Administered 2017-09-11: 40 mg via INTRAVENOUS
  Filled 2017-09-11: qty 4

## 2017-09-11 MED ORDER — FENTANYL CITRATE (PF) 100 MCG/2ML IJ SOLN
INTRAMUSCULAR | Status: AC
Start: 2017-09-11 — End: 2017-09-12
  Filled 2017-09-11: qty 2

## 2017-09-11 MED ORDER — SODIUM CHLORIDE 0.9 % IV SOLN
INTRAVENOUS | Status: DC | PRN
  Administered 2017-09-11: 1000 mg via INTRAVENOUS

## 2017-09-11 MED ORDER — IOPAMIDOL (ISOVUE-300) INJECTION 61%
INTRAVENOUS | Status: AC
  Administered 2017-09-11: 125 mL
  Filled 2017-09-11: qty 300

## 2017-09-11 MED ORDER — PHENYLEPHRINE HCL 10 MG/ML IJ SOLN
INTRAVENOUS | Status: DC | PRN
  Administered 2017-09-11: 25 ug/min via INTRAVENOUS

## 2017-09-11 MED ORDER — ACETAMINOPHEN 325 MG PO TABS: 650.0000 mg | ORAL_TABLET | ORAL | Status: DC | PRN

## 2017-09-11 MED ORDER — ACETAMINOPHEN 160 MG/5ML PO SOLN: 650.0000 mg | ORAL | Status: DC | PRN

## 2017-09-11 MED ORDER — SENNOSIDES-DOCUSATE SODIUM 8.6-50 MG PO TABS
1.0000 | ORAL_TABLET | Freq: Every day | ORAL | Status: DC
  Administered 2017-09-12 – 2017-09-15 (×3): 1 via NASOGASTRIC
  Filled 2017-09-11 (×3): qty 1

## 2017-09-11 MED ORDER — LEVETIRACETAM 250 MG PO TABS
250.0000 mg | ORAL_TABLET | Freq: Two times a day (BID) | ORAL | Status: DC
  Administered 2017-09-12 – 2017-09-15 (×5): 250 mg via ORAL
  Filled 2017-09-11 (×9): qty 1

## 2017-09-11 MED ORDER — CEFAZOLIN SODIUM-DEXTROSE 2-4 GM/100ML-% IV SOLN
INTRAVENOUS | Status: AC
  Filled 2017-09-11: qty 100

## 2017-09-11 MED ORDER — FUROSEMIDE 10 MG/ML IJ SOLN: 80.0000 mg | Freq: Once | INTRAMUSCULAR | Status: DC

## 2017-09-11 MED ORDER — HYDROCORTISONE NA SUCCINATE PF 250 MG IJ SOLR
200.0000 mg | INTRAMUSCULAR | Status: DC
Start: 2017-09-11 — End: 2017-09-11
  Administered 2017-09-11: 200 mg via INTRAVENOUS
  Filled 2017-09-11: qty 200

## 2017-09-11 MED ORDER — PROPOFOL 10 MG/ML IV BOLUS
INTRAVENOUS | Status: DC | PRN
  Administered 2017-09-11: 90 mg via INTRAVENOUS

## 2017-09-11 MED ORDER — ROCURONIUM BROMIDE 100 MG/10ML IV SOLN
INTRAVENOUS | Status: DC | PRN
  Administered 2017-09-11 (×2): 50 mg via INTRAVENOUS

## 2017-09-11 MED ORDER — FENTANYL CITRATE (PF) 250 MCG/5ML IJ SOLN
INTRAMUSCULAR | Status: DC | PRN
  Administered 2017-09-11 (×2): 50 ug via INTRAVENOUS

## 2017-09-11 MED ORDER — ACETAMINOPHEN 650 MG RE SUPP
650.0000 mg | RECTAL | Status: DC | PRN
Start: 2017-09-11 — End: 2017-09-16

## 2017-09-11 MED ORDER — IPRATROPIUM-ALBUTEROL 0.5-2.5 (3) MG/3ML IN SOLN: 3.0000 mL | Freq: Four times a day (QID) | RESPIRATORY_TRACT | Status: DC

## 2017-09-11 MED ORDER — IPRATROPIUM-ALBUTEROL 0.5-2.5 (3) MG/3ML IN SOLN
3.0000 mL | RESPIRATORY_TRACT | Status: DC
  Administered 2017-09-11 – 2017-09-12 (×6): 3 mL via RESPIRATORY_TRACT
  Filled 2017-09-11 (×7): qty 3

## 2017-09-11 MED ORDER — NITROGLYCERIN 1 MG/10 ML FOR IR/CATH LAB
INTRA_ARTERIAL | Status: AC
  Filled 2017-09-11: qty 10

## 2017-09-11 MED ORDER — SODIUM CHLORIDE 0.9 % IV SOLN
INTRAVENOUS | Status: DC
  Administered 2017-09-12: via INTRAVENOUS

## 2017-09-11 MED ORDER — SUCCINYLCHOLINE CHLORIDE 20 MG/ML IJ SOLN
INTRAMUSCULAR | Status: DC | PRN
  Administered 2017-09-11: 100 mg via INTRAVENOUS

## 2017-09-11 NOTE — Progress Notes (Signed)
One white colored band given to daughter; [name].  Contact info [name]; daughter [phone] Cleda Daub; son in law [phone]
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
## 2017-09-11 NOTE — Anesthesia Preprocedure Evaluation (Addendum)
Anesthesia Evaluation  Patient identified by MRN, date of birth, ID band Patient confused    Reviewed: Allergy & Precautions, H&P , NPO status , Patient's Chart, lab work & pertinent test results  Airway Mallampati: III  TM Distance: >3 FB Neck ROM: Limited    Dental no notable dental hx. (+) Teeth Intact, Dental Advisory Given   Pulmonary neg pulmonary ROS, former smoker,    Pulmonary exam normal breath sounds clear to auscultation       Cardiovascular hypertension, + Peripheral Vascular Disease  + dysrhythmias Atrial Fibrillation  Rhythm:Irregular Rate:Normal     Neuro/Psych Anxiety Depression CVA, Residual Symptoms    GI/Hepatic Neg liver ROS, GERD  Medicated and Controlled,  Endo/Other  negative endocrine ROS  Renal/GU Renal InsufficiencyRenal disease  negative genitourinary   Musculoskeletal   Abdominal   Peds  Hematology negative hematology ROS (+) anemia ,   Anesthesia Other Findings   Reproductive/Obstetrics negative OB ROS                            Anesthesia Physical Anesthesia Plan  ASA: III and emergent  Anesthesia Plan: General   Post-op Pain Management:    Induction: Intravenous, Rapid sequence and Cricoid pressure planned  PONV Risk Score and Plan: 3 and Ondansetron, Dexamethasone and Treatment may vary due to age or medical condition  Airway Management Planned: Oral ETT  Additional Equipment: Arterial line  Intra-op Plan:   Post-operative Plan: Post-operative intubation/ventilation  Informed Consent: I have reviewed the patients History and Physical, chart, labs and discussed the procedure including the risks, benefits and alternatives for the proposed anesthesia with the patient or authorized representative who has indicated his/her understanding and acceptance.   Dental advisory given  Plan Discussed with: CRNA  Anesthesia Plan Comments:          Anesthesia Quick Evaluation

## 2017-09-11 NOTE — Progress Notes (Signed)
PCCM Interval Note   Patient arrived from IR intubated. Vent and Sedation orders placed. CXR for ETT placement and ABG pending.   Hayden Pedro, AGACNP-BC [facility]  Pgr: [phone]  PCCM Pgr: [phone]

## 2017-09-11 NOTE — Progress Notes (Signed)
Pt swabbed and sent respiratory panel to lab

## 2017-09-11 NOTE — Progress Notes (Signed)
Patient ID: CHERILYNN SCHOMBURG, female   DOB: 04/19/1932, 83 y.o.   MRN: [id number] INR. 83 Yr old rt handed female with acute ion set of aphasia ,and rt sided weakness.LSN 630 pm In patient with  Modified premorbid score of 0.NIH  24 CT brain revealed NO ICH. ASPECTS 10. CTA not done  Hx of contrast  Allergy. Because  Of high NIH high likelihood of large vessel occlusion  Suspected. Option of endovascular revascularization discussed with patients daughter,Reasons ,risks alternatives all reviewed. Risks of ICH 10 to 15 % ,worsening neurological condition,vent dependency ,inability to revascularize and death  were all reviewed. Questions answered to their satisfaction., Informed  witnessed consent obtained for endovascular treatment.Marland Kitchen S.Myliah Medel MD

## 2017-09-11 NOTE — Progress Notes (Signed)
Progress Note  Patient Name: Kayla Kent Date of Encounter: 09/11/2017  Primary Cardiologist: Harrington Challenger  Subjective   Much more dyspnea this am   Inpatient Medications    Scheduled Meds: . apixaban  2.5 mg Oral BID  . diltiazem  180 mg Oral Daily  . feeding supplement (ENSURE ENLIVE)  237 mL Oral BID BM  . ipratropium-albuterol  3 mL Nebulization Q4H  . mouth rinse  15 mL Mouth Rinse BID  . oxybutynin  10 mg Oral QHS  . pantoprazole  40 mg Oral Daily  . senna-docusate  1 tablet Oral QHS  . sodium chloride flush  3 mL Intravenous Q12H  . traZODone  50 mg Oral QHS   Continuous Infusions: . levETIRAcetam Stopped (09/11/17 0542)  . meropenem (MERREM) IV Stopped (09/11/17 0600)  . vancomycin Stopped (09/10/17 2100)   PRN Meds: acetaminophen, ALPRAZolam, ondansetron **OR** ondansetron (ZOFRAN) IV, sodium chloride   Vital Signs    Vitals:   09/11/17 0900 09/11/17 1000 09/11/17 1022 09/11/17 1100  BP: (!) 135/52 (!) 150/64 (!) 150/64 (!) 112/59  Pulse: 92 (!) 104  99  Resp: (!) 30 (!) 29  (!) 31  Temp:      TempSrc:      SpO2: (!) 86% (!) 85%  (!) 88%  Weight:      Height:        Intake/Output Summary (Last 24 hours) at 09/11/2017 1141 Last data filed at 09/11/2017 0659 Gross per 24 hour  Intake 1691.67 ml  Output 1025 ml  Net 666.67 ml   Filed Weights   09/09/17 1339 09/10/17 0401 09/11/17 0424  Weight: 110 lb (49.9 kg) 114 lb (51.7 kg) 120 lb (54.4 kg)    Telemetry    Afib  90s  - Personally Reviewed  ECG      Physical Exam  Thin frail white female  GEN: midl distress  Neck: No JVD Cardiac:   Irreg irreg  No S3  , no murmurs, rubs, or gallops.  Respiratory: diffuse rhonchi and wheezing worse at bases  GI: Soft, nontender, non-distended  MS: No edema; No deformity. Neuro:  Nonfocal  Psych: Normal affect   Labs    Chemistry Recent Labs  Lab 09/06/17 1102 09/07/17 0347  09/08/17 0336 09/09/17 0339 09/10/17 0308 09/11/17 0549  NA 138  139   < > 139 139 137 135  K 3.8 4.0   < > 3.2* 3.8 3.9 4.0  CL 102 109   < > 106 107 108 104  CO2 26 23   < > 26 25 23 22   GLUCOSE 106* 65   < > 91 94 82 119*  BUN 31* 22*   < > 19 16 19 20   CREATININE 1.51* 1.07*   < > 1.14* 0.83 0.84 0.85  CALCIUM 8.4* 7.8*   < > 7.5* 7.6* 7.5* 7.5*  PROT 5.1* 4.7*  --  5.3*  --   --   --   ALBUMIN 2.6* 2.2*  --  2.2*  --   --   --   AST 66* 110*  --  109*  --   --   --   ALT 34 48  --  58*  --   --   --   ALKPHOS 105 86  --  109  --   --   --   BILITOT 1.2 1.4*  --  1.2  --   --   --   GFRNONAA 31* 47*   < >  43* >60 >60 >60  GFRAA 36* 54*   < > 50* >60 >60 >60  ANIONGAP 10 7   < > 7 7 6 9    < > = values in this interval not displayed.     Hematology Recent Labs  Lab 09/09/17 0339 09/10/17 0308 09/11/17 0549  WBC 15.6* 8.9 18.6*  RBC 3.62* 3.58* 4.04  HGB 11.1* 10.9* 12.5  HCT 33.2* 32.7* 36.7  MCV 91.7 91.3 90.8  MCH 30.7 30.4 30.9  MCHC 33.4 33.3 34.1  RDW 15.5 15.3 15.2  PLT 172 184 197    Cardiac EnzymesNo results for input(s): TROPONINI in the last 168 hours. No results for input(s): TROPIPOC in the last 168 hours.   BNP Recent Labs  Lab 09/06/17 1649  BNP 722.5*     DDimer No results for input(s): DDIMER in the last 168 hours.   Radiology    Ct Chest Wo Contrast  Result Date: 09/11/2017 CLINICAL DATA:  83 year old female with history of shortness of breath. EXAM: CT CHEST WITHOUT CONTRAST TECHNIQUE: Multidetector CT imaging of the chest was performed following the standard protocol without IV contrast. COMPARISON:  Chest CT 08/23/2017. FINDINGS: Cardiovascular: Heart size is mildly enlarged. There is no significant pericardial fluid, thickening or pericardial calcification. There is aortic atherosclerosis, as well as atherosclerosis of the great vessels of the mediastinum and the coronary arteries, including calcified atherosclerotic plaque in the left main, left anterior descending, left circumflex and right coronary  arteries. Mediastinum/Nodes: No pathologically enlarged mediastinal or hilar lymph nodes. Please note that accurate exclusion of hilar adenopathy is limited on noncontrast CT scans. Esophagus is unremarkable in appearance. No axillary lymphadenopathy. Lungs/Pleura: Multifocal asymmetrically distributed airspace consolidation throughout the lungs bilaterally, concerning for multilobar pneumonia. Previously noted right upper lobe pulmonary nodule is now largely obscured by adjacent airspace disease and is not readily identifiable. Moderate bilateral pleural effusions. Near complete atelectasis of the left lower lobe with some additional dependent atelectasis in the right lower lobe. Upper Abdomen: Aortic atherosclerosis. Musculoskeletal: There are no aggressive appearing lytic or blastic lesions noted in the visualized portions of the skeleton. IMPRESSION: 1. Findings are most compatible with severe multilobar bronchopneumonia. 2. Moderate bilateral pleural effusions with areas of dependent atelectasis in the lower lobes of lungs bilaterally. 3. Cardiomegaly. 4. Aortic atherosclerosis, in addition to left main and 3 vessel coronary artery disease. 5. Previously treated right upper lobe pulmonary nodule is now obscured by overlying airspace disease. Attention on followup studies is recommended. Aortic Atherosclerosis (ICD10-I70.0). Electronically Signed   By: Vinnie Langton M.D.   On: 09/11/2017 11:06   Dg Chest Port 1 View  Result Date: 09/11/2017 CLINICAL DATA:  83 year old female with history of fever for 1 day. EXAM: PORTABLE CHEST 1 VIEW COMPARISON:  Chest x-ray 09/07/2017. FINDINGS: Decreasing lung volumes with worsening aeration throughout the mid to lower lungs bilaterally, with air bronchograms evident in the right lower lobe, concerning for progressive multilobar pneumonia. Small right and moderate left pleural effusions. Pulmonary vasculature is obscured. No pneumothorax. Cardiomegaly. The patient is  rotated to the left on today's exam, resulting in distortion of the mediastinal contours and reduced diagnostic sensitivity and specificity for mediastinal pathology. Atherosclerosis in the thoracic aorta. IMPRESSION: 1. Findings are compatible with worsening severe multilobar pneumonia, most evident throughout the mid to lower lungs bilaterally. 2. Small right and moderate left parapneumonic pleural effusions. 3. Cardiomegaly. 4. Aortic atherosclerosis. Electronically Signed   By: Vinnie Langton M.D.   On: 09/11/2017 08:09  Cardiac Studies    Patient Profile     83 y.o. female of stage 3 CKD, HLD, chronic afib on Eliquis, ablation for SVT 2010, HTN, seizure disorder, PVD and hypermetabolic thyroid nodule s/p FNA 10/2018who is being seen today for the evaluation of atrial fibrillation with RVRat the request of Mikhail.    Assessment & Plan    1  Atrial fib  On cardizem and low dose eliquis rates harder to control due to respiratory illness   2  Acute diastolic CHF  Volume status is improved  She does not look wet on exam   3  Sepsis  Worsening CXR this am and CT with multilobar pneumonia at risk for intubation Consider having critical care/pulmonary see Antibiotics per primary service keep on dry side due to ARDS and low albumin   4  Melena   Resolved EGD with gastritis back on low dose eliquis   5  Neuro/psych.  Pt with some baseline probable dementia from my interactions with her in clinic   Exacerbated by current illness  For questions or updates, please contact [facility] Please consult [email or web address] for contact info under Cardiology/STEMI.      Signed, Jenkins Rouge, MD  09/11/2017, 11:41 AM

## 2017-09-11 NOTE — Progress Notes (Signed)
PROGRESS NOTE  Kayla Kent YSA:[id number] DOB: 1932-01-27 DOA: 09/06/2017 PCP: Biagio Borg, MD  HPI/Recap of past 24 hours: HPI On 09/06/2017 by Ms. [name], NP Samuel Jester a 83 y.o.femalewith medical history significant forstage III chronic kidney disease, dyslipidemia, chronic atrial fibrillation on Eliquis, hypertension, seizure disorder on AEDs, and recent FNA of thyroid nodule. EMS was called to the patient's home today after heating and air person discovered the patient on the floor. Upon EMS arrival they noticed a toilet full of dark tarry stools. Patient reportedly vomited red emesis the previous night but states she ate tomato soup. In the ER the patient remains confused although at times can answer some questions appropriately. BP somewhat suboptimal with systolic ranging from 323-557. She was not orthostatic when checked. Stools were heme positive. BUN elevated at 31. AST elevated at 66. Lactic acid elevated at 2.44 and 2.05. White count elevated at 17,600 differential not obtained. Hemoglobin 12.7 she is consistent with her baseline. Patient has abnormal urinalysis and previous urine culture from September revealed ESBL E. coli therefore patient placed on contact isolation. No focal neurological deficits detected from baseline.  Today, pt noted to have fever spikes, dyspneic, tachypneic with sats dropping to the 80s. Pt not noted to cough, but suspect aspiration PNA. Pt currently is awake, ill-appearing, denied any chest pain, abdominal pain, dysuria, N/V/D/C, signs of active bleeding.  Assessment/Plan: Principal Problem:   Acute upper GI bleed Active Problems:   Paroxysmal atrial fibrillation (HCC)   HTN (hypertension)   Seizure disorder (HCC)   Chronic atrial fibrillation (HCC)   Abnormal urinalysis   Thyroid nodule   CKD (chronic kidney disease), stage III (HCC)   Dyslipidemia   Emphysema of lung (HCC)   Heme positive stool   Pressure injury of  skin   Melena   Acute hypoxemic respiratory failure (HCC)  Severe sepsis likely from Multilobar PNA -Suspect likely aspiration PNA Vs HCAP/CAP -Febrile with a temp of 101.3, with leukocytosis 18.4 -LA trending up at 2.4, procalcitonin 2.17, will trend -Repeat BC X 2 pending -Respiratory panel pending -Urine strep PNA antigen negative -CT chest showed: Findings most compatible with severe multilobar bronchopneumonia -Held IVF for now as pt is very dysneic. If LA continues to rise, will start hydration and prepare for   possible intubation -S/P 1 dose of IV lasix with good urine output -On 100% non-rebreather, Duonebs -ID consulted, appreciate rec -Continue Meropenem, start linezolid as per ID -Due to worsening resp status, consulted PCCM, plan is to transfer pt to the ICU  Acute hypoxic respiratory failure -Likely from multilobar PNA -ABG pending -Management as above  Acute symptomatic GI bleed, likely from gastritis Stable - Hx of +NSAIDs use, Eliquis for anticoagulation -FOBT + -Hemoglobin currently stable post EGD -EGD done 11/23: Normal esophagus, no stricture.  Small hiatal hernia present.  Patchy mild inflammation found in the entire stomach, biopsies taken for H. pylori testing.  Normal duodenum -Gastroenterology consulted and appreciated-resume diet, resume Eliquis, continue Protonix 40 mg daily for 1 month. No need for colonoscopy at this time -Continue Protonix 40mg  daily -Continue to monitor CBC  Sepsis secondary to ESBL UTI Resolved -developed fever, tachycardia, tachypnea, resolving leukocytosis on admission (??could have been from PNA) -UA: Many bacteria, 0-5 WBC, trace leukocytes -Complains of dysuria and urinary frequency -In September 2018, patient had ESBL Escherichia coli, which was sensitive to Cipro -Urine culture currently >100K Klebsiella pneumoniae on this admission -Blood cultures show no growth to date -Repeat UA  neg, repeat UC pending -On IV  meropenem & linezolid for PNA  Essential hypertension -Held amlodipine, restarted diltiazem -Continue to monitor  Chronic atrial fibrillation with periods of RVR -CHADSVASC 4 (based on gender, age, hypertension) -Restart Eliquis, ok with GI -Continue telemetry monitoring -Cardiology consulted rec- starting PO diltiazem 180mg  daily -replace electrolytes prn   Pulmonary edema/Acute diastolic dysfunction -possibly secondary to tachycardia and IVF -Last echocardiogram 04/08/2017 shows EF 55-60%, PA peak pressure of 68mmHg -CXR showed some pulm edema -Cardiology on board  Failure to thrive/Poor oral intake Nutrition consult  AKI on Chronic kidney disease, stage III Resolved -Creatinine on admission 1.51 (however GFR within stage III), appears that baseline is 1.07-1.08 -Currently creatinine 0.85, continue to monitor BMP  Confusion/dementia? -Per outpatient documentation, patient has had some memory lapses for the past 1 year -Acute changes possibly due to GI bleeding versus infection -CT head showed atrophy with periventricular small vessel disease. Chronic encephalomalacia of the right temporal lobe. No acute infarct. 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 -Patient currently lives alone -Education officer, museum consulted -PT and OT consult placed  Seizure disorder -Continue Keppra -Keppra level pending  Thyroid nodule -Recent FNA of nodule as per pathology -Patient should follow-up with her primary care physician regarding this  Dyslipidemia -Statin held     Code Status: Full  Family Communication: None at bedside  Disposition Plan: Pending ICU transfer   Consultants:  GI  Cardiology  ID  PCCM  Procedures:  None  Antimicrobials:  IV vancomycin discontinued on 09/11/17  IV Meropenem  IV linezolid started on 09/11/17  DVT prophylaxis:  Eliquis   Objective: Vitals:   09/11/17 1200 09/11/17 1204 09/11/17 1300 09/11/17 1327  BP: (!)  146/50  (!) 123/55   Pulse: 94  (!) 108 (!) 103  Resp: (!) 26  (!) 31 15  Temp: 98.7 F (37.1 C)     TempSrc: Oral     SpO2: 91% (!) 88% 90%   Weight:      Height:        Intake/Output Summary (Last 24 hours) at 09/11/2017 1431 Last data filed at 09/11/2017 1100 Gross per 24 hour  Intake 1661.67 ml  Output 1525 ml  Net 136.67 ml   Filed Weights   09/09/17 1339 09/10/17 0401 09/11/17 0424  Weight: 49.9 kg (110 lb) 51.7 kg (114 lb) 54.4 kg (120 lb)    Exam:   General:  Mild distress, alert, awake, oriented to self, place  Cardiovascular:  S1-S2 present, irrgeular, no added hrt sound  Respiratory: Tachypneic, rhonchi present, fine crackles noted bilaterally   Abdomen: Soft, non-tender, non-distended, +BS  Musculoskeletal: No pedal edema   Skin: Normal  Psychiatry: Normal mood   Data Reviewed: CBC: Recent Labs  Lab 09/07/17 0347 09/08/17 0336 09/09/17 0339 09/10/17 0308 09/11/17 0549  WBC 24.9* 20.5* 15.6* 8.9 18.6*  NEUTROABS  --   --   --  6.8 15.7*  HGB 11.1* 11.2* 11.1* 10.9* 12.5  HCT 33.2* 34.1* 33.2* 32.7* 36.7  MCV 94.3 92.9 91.7 91.3 90.8  PLT 173 164 172 184 631   Basic Metabolic Panel: Recent Labs  Lab 09/06/17 1649  09/07/17 1620 09/08/17 0336 09/08/17 0716 09/09/17 0339 09/10/17 0308 09/11/17 0549  NA  --    < > 140 139  --  139 137 135  K  --    < > 4.6 3.2*  --  3.8 3.9 4.0  CL  --    < > 111  106  --  107 108 104  CO2  --    < > 18* 26  --  25 23 22   GLUCOSE  --    < > 84 91  --  94 82 119*  BUN  --    < > 22* 19  --  16 19 20   CREATININE  --    < > 1.33* 1.14*  --  0.83 0.84 0.85  CALCIUM  --    < > 8.0* 7.5*  --  7.6* 7.5* 7.5*  MG 1.4*  --  1.7  --  2.0  --   --   --    < > = values in this interval not displayed.   GFR: Estimated Creatinine Clearance: 43.1 mL/min (by C-G formula based on SCr of 0.85 mg/dL). Liver Function Tests: Recent Labs  Lab 09/06/17 1102 09/07/17 0347 09/08/17 0336  AST 66* 110* 109*  ALT 34 48  58*  ALKPHOS 105 86 109  BILITOT 1.2 1.4* 1.2  PROT 5.1* 4.7* 5.3*  ALBUMIN 2.6* 2.2* 2.2*   No results for input(s): LIPASE, AMYLASE in the last 168 hours. No results for input(s): AMMONIA in the last 168 hours. Coagulation Profile: No results for input(s): INR, PROTIME in the last 168 hours. Cardiac Enzymes: No results for input(s): CKTOTAL, CKMB, CKMBINDEX, TROPONINI in the last 168 hours. BNP (last 3 results) Recent Labs    10/22/16 1135  PROBNP 410.0*   HbA1C: No results for input(s): HGBA1C in the last 72 hours. CBG: No results for input(s): GLUCAP in the last 168 hours. Lipid Profile: No results for input(s): CHOL, HDL, LDLCALC, TRIG, CHOLHDL, LDLDIRECT in the last 72 hours. Thyroid Function Tests: No results for input(s): TSH, T4TOTAL, FREET4, T3FREE, THYROIDAB in the last 72 hours. Anemia Panel: No results for input(s): VITAMINB12, FOLATE, FERRITIN, TIBC, IRON, RETICCTPCT in the last 72 hours. Urine analysis:    Component Value Date/Time   COLORURINE COLORLESS (A) 09/11/2017 1146   APPEARANCEUR CLEAR 09/11/2017 1146   APPEARANCEUR Clear 07/22/2017 1022   LABSPEC 1.005 09/11/2017 1146   PHURINE 5.0 09/11/2017 1146   GLUCOSEU NEGATIVE 09/11/2017 1146   GLUCOSEU NEGATIVE 04/13/2017 0839   HGBUR SMALL (A) 09/11/2017 1146   BILIRUBINUR NEGATIVE 09/11/2017 1146   BILIRUBINUR Negative 07/22/2017 1022   KETONESUR NEGATIVE 09/11/2017 1146   PROTEINUR NEGATIVE 09/11/2017 1146   UROBILINOGEN 0.2 07/04/2017 0930   UROBILINOGEN 0.2 04/13/2017 0839   NITRITE NEGATIVE 09/11/2017 1146   LEUKOCYTESUR NEGATIVE 09/11/2017 1146   LEUKOCYTESUR Negative 07/22/2017 1022   Sepsis Labs: @LABRCNTIP (procalcitonin:4,lacticidven:4)  ) Recent Results (from the past 240 hour(s))  Blood culture (routine x 2)     Status: None (Preliminary result)   Collection Time: 09/06/17 11:44 AM  Result Value Ref Range Status   Specimen Description BLOOD RIGHT ARM UPPER  Final   Special Requests  IN PEDIATRIC BOTTLE Blood Culture adequate volume  Final   Culture NO GROWTH 4 DAYS  Final   Report Status PENDING  Incomplete  Blood culture (routine x 2)     Status: None (Preliminary result)   Collection Time: 09/06/17 12:08 PM  Result Value Ref Range Status   Specimen Description BLOOD RIGHT ANTECUBITAL  Final   Special Requests   Final    BOTTLES DRAWN AEROBIC AND ANAEROBIC Blood Culture adequate volume   Culture NO GROWTH 4 DAYS  Final   Report Status PENDING  Incomplete  Urine Culture     Status: Abnormal   Collection  Time: 09/06/17  2:27 PM  Result Value Ref Range Status   Specimen Description URINE, RANDOM  Final   Special Requests NONE  Final   Culture (A)  Final    >=100,000 COLONIES/mL KLEBSIELLA PNEUMONIAE Confirmed Extended Spectrum Beta-Lactamase Producer (ESBL).  In bloodstream infections from ESBL organisms, carbapenems are preferred over piperacillin/tazobactam. They are shown to have a lower risk of mortality.    Report Status 09/09/2017 FINAL  Final   Organism ID, Bacteria KLEBSIELLA PNEUMONIAE (A)  Final      Susceptibility   Klebsiella pneumoniae - MIC*    AMPICILLIN >=32 RESISTANT Resistant     CEFAZOLIN >=64 RESISTANT Resistant     CEFTRIAXONE 16 INTERMEDIATE Intermediate     CIPROFLOXACIN 1 SENSITIVE Sensitive     GENTAMICIN <=1 SENSITIVE Sensitive     IMIPENEM <=0.25 SENSITIVE Sensitive     NITROFURANTOIN 64 INTERMEDIATE Intermediate     TRIMETH/SULFA >=320 RESISTANT Resistant     AMPICILLIN/SULBACTAM >=32 RESISTANT Resistant     PIP/TAZO <=4 SENSITIVE Sensitive     Extended ESBL POSITIVE Resistant     * >=100,000 COLONIES/mL KLEBSIELLA PNEUMONIAE  MRSA PCR Screening     Status: None   Collection Time: 09/06/17  5:15 PM  Result Value Ref Range Status   MRSA by PCR NEGATIVE NEGATIVE Final    Comment:        The GeneXpert MRSA Assay (FDA approved for NASAL specimens only), is one component of a comprehensive MRSA colonization surveillance program.  It is not intended to diagnose MRSA infection nor to guide or monitor treatment for MRSA infections.       Studies: Ct Chest Wo Contrast  Result Date: 09/11/2017 CLINICAL DATA:  83 year old female with history of shortness of breath. EXAM: CT CHEST WITHOUT CONTRAST TECHNIQUE: Multidetector CT imaging of the chest was performed following the standard protocol without IV contrast. COMPARISON:  Chest CT 08/23/2017. FINDINGS: Cardiovascular: Heart size is mildly enlarged. There is no significant pericardial fluid, thickening or pericardial calcification. There is aortic atherosclerosis, as well as atherosclerosis of the great vessels of the mediastinum and the coronary arteries, including calcified atherosclerotic plaque in the left main, left anterior descending, left circumflex and right coronary arteries. Mediastinum/Nodes: No pathologically enlarged mediastinal or hilar lymph nodes. Please note that accurate exclusion of hilar adenopathy is limited on noncontrast CT scans. Esophagus is unremarkable in appearance. No axillary lymphadenopathy. Lungs/Pleura: Multifocal asymmetrically distributed airspace consolidation throughout the lungs bilaterally, concerning for multilobar pneumonia. Previously noted right upper lobe pulmonary nodule is now largely obscured by adjacent airspace disease and is not readily identifiable. Moderate bilateral pleural effusions. Near complete atelectasis of the left lower lobe with some additional dependent atelectasis in the right lower lobe. Upper Abdomen: Aortic atherosclerosis. Musculoskeletal: There are no aggressive appearing lytic or blastic lesions noted in the visualized portions of the skeleton. IMPRESSION: 1. Findings are most compatible with severe multilobar bronchopneumonia. 2. Moderate bilateral pleural effusions with areas of dependent atelectasis in the lower lobes of lungs bilaterally. 3. Cardiomegaly. 4. Aortic atherosclerosis, in addition to left main and  3 vessel coronary artery disease. 5. Previously treated right upper lobe pulmonary nodule is now obscured by overlying airspace disease. Attention on followup studies is recommended. Aortic Atherosclerosis (ICD10-I70.0). Electronically Signed   By: Vinnie Langton M.D.   On: 09/11/2017 11:06   Dg Chest Port 1 View  Result Date: 09/11/2017 CLINICAL DATA:  83 year old female with history of fever for 1 day. EXAM: PORTABLE CHEST 1 VIEW COMPARISON:  Chest x-ray 09/07/2017. FINDINGS: Decreasing lung volumes with worsening aeration throughout the mid to lower lungs bilaterally, with air bronchograms evident in the right lower lobe, concerning for progressive multilobar pneumonia. Small right and moderate left pleural effusions. Pulmonary vasculature is obscured. No pneumothorax. Cardiomegaly. The patient is rotated to the left on today's exam, resulting in distortion of the mediastinal contours and reduced diagnostic sensitivity and specificity for mediastinal pathology. Atherosclerosis in the thoracic aorta. IMPRESSION: 1. Findings are compatible with worsening severe multilobar pneumonia, most evident throughout the mid to lower lungs bilaterally. 2. Small right and moderate left parapneumonic pleural effusions. 3. Cardiomegaly. 4. Aortic atherosclerosis. Electronically Signed   By: Vinnie Langton M.D.   On: 09/11/2017 08:09    Scheduled Meds: . apixaban  2.5 mg Oral BID  . diltiazem  180 mg Oral Daily  . feeding supplement (ENSURE ENLIVE)  237 mL Oral BID BM  . ipratropium-albuterol  3 mL Nebulization Q4H  . levETIRAcetam  250 mg Oral BID  . mouth rinse  15 mL Mouth Rinse BID  . oxybutynin  10 mg Oral QHS  . pantoprazole  40 mg Oral Daily  . senna-docusate  1 tablet Oral QHS  . sodium chloride flush  3 mL Intravenous Q12H  . traZODone  50 mg Oral QHS    Continuous Infusions: . linezolid (ZYVOX) IV    . meropenem (MERREM) IV Stopped (09/11/17 0600)     LOS: 5 days     Alma Friendly, MD [facility]   If 7PM-7AM, please contact night-coverage [email or web address] Password [facility] 09/11/2017, 2:31 PM

## 2017-09-11 NOTE — Progress Notes (Signed)
CRITICAL VALUE ALERT  Critical Value: Lactic 2.4  Date & Time Notied:  09/11/17 1217  Provider Notified: Dr Horris Latino   Orders Received/Actions taken: 1220

## 2017-09-11 NOTE — Progress Notes (Signed)
PCCM came & examined pt and spoke to pt and her daughter - answered questions for family. Will place orders for transfer to ICU - PT requiring 02 at 100 % nonrebreather mask - called respiratory to inquire about placing pt on Heated High Flow 02 to deliver much higher concentrations of 02. Awaiting call back from RT Becky F.

## 2017-09-11 NOTE — Progress Notes (Signed)
Text page to DR Horris Latino about pt's 02 at 14 l sats running 89 to 90 % decreased breath sounds accompanied by tachypnea - alert no c/o chest pain

## 2017-09-11 NOTE — Progress Notes (Signed)
Pt placed on High Flow Oxygen at 30 Liters - 02 sat now 95%

## 2017-09-11 NOTE — Progress Notes (Signed)
Pulses were found using the doppler by the ICU nurses that brought patient to IR suite

## 2017-09-11 NOTE — Progress Notes (Signed)
Dr Johnnye Sima in to examine pt - spoke to pt and daughter answered all questions. Orders received

## 2017-09-11 NOTE — Sedation Documentation (Signed)
Family updated as to patient's status.

## 2017-09-11 NOTE — Consult Note (Signed)
PULMONARY / CRITICAL CARE MEDICINE   Name: Kayla Kent MRN: [id number] DOB: 05/06/1932    ADMISSION DATE:  09/06/2017 CONSULTATION DATE:  09/11/17  REFERRING MD:  Lesia Sago   CHIEF COMPLAINT:  hypoxemia  HISTORY OF PRESENT ILLNESS:   83 year old female history of on apixaban, seizure disorder, documented COPD, right upper lobe lung cancer status post radiation therapy who initially presented 09/06/17 for confusion and hematemesis after being found down.  EGD was performed 11/23 which was only notable for patchy mild inflammation thoughout the stomach, respumed gastritis with stable hemoglobin and no further episodes of bleeding.  In addition she was being treated for an ESBL UTI based on urinalysis showing many bacteria and prior cultures showing ESBL E. coli.  Urine cultures here subsequently grew greater than 100,000 Klebsiella pneumoniae.  She was also noted to have multifocal infiltrates on her CT chest concerning for pneumonia.  Infectious disease was consulted on 11/25 and recommended changing vancomycin to linezolid for presumed aspiration causing her pneumonia throughout her admission her oxygen requirement has progressively increased to requiring a nonrebreather today to maintain oxygen saturation at 90% for which we were consulted.  PAST MEDICAL HISTORY :  She  has a past medical history of Allergic rhinitis, Anxiety, CKD (chronic kidney disease) stage 3, GFR 30-59 ml/min (HCC), Emphysema of lung (HCC), HLD (hyperlipidemia), HTN (hypertension), Intolerance of drug, Paroxysmal supraventricular tachycardia ([place]), PVD (peripheral vascular disease) ([place]), Seizure ([place]), Syncope and collapse, and Uterine prolapse without mention of vaginal wall prolapse.  PAST SURGICAL HISTORY: She  has a past surgical history that includes corrective eye surgery; Cholecystectomy; Tonsillectomy; IVD removed; stress cardiolite (08/05/93); RF ablation PSVT; Total hip arthroplasty (Right,  08/29/2014); Open reduction internal fixation (orif) distal radial fracture (Right, 08/29/2014); Cardioversion (N/A, 11/05/2014); cataract surgery (08/2015); Intramedullary (im) nail intertrochanteric (Left, 11/21/2016); and Esophagogastroduodenoscopy (N/A, 09/09/2017).  Allergies  Allergen Reactions  . Chocolate Hives  . Codeine Other (See Comments)    Patient states she acts crazy  . Diazepam Other (See Comments)    Patient states she acts crazy.  . Fruit & Vegetable Daily [Nutritional Supplements] Hives and Swelling    peaches  . Iohexol Hives     Code: HIVES, Desc: pt gets 13 hr pre-meds, Onset Date: [id number]   . Latex Hives  . Peach [Prunus Persica] Hives  . Peanut-Containing Drug Products Hives and Swelling  . Penicillins Hives, Itching and Swelling    Has patient had a PCN reaction causing immediate rash, facial/tongue/throat swelling, SOB or lightheadedness with hypotension: Yes Has patient had a PCN reaction causing severe rash involving mucus membranes or skin necrosis: No Has patient had a PCN reaction that required hospitalization No Has patient had a PCN reaction occurring within the last 10 years: No If all of the above answers are "NO", then may proceed with Cephalosporin use.   . Strawberry Extract Hives  . Sulfonamide Derivatives Hives    nausea  . Wheat Shortness Of Breath    Shortness of breath  . Wheat Bran Shortness Of Breath  . Sulfamethoxazole Hives  . Crestor [Rosuvastatin Calcium] Rash  . Iodine Rash    No current facility-administered medications on file prior to encounter.    Current Outpatient Medications on File Prior to Encounter  Medication Sig  . ALPRAZolam (XANAX) 0.25 MG tablet TAKE 1 TABLET BY MOUTH TWICE DAILY AS NEEDED  . amLODipine (NORVASC) 2.5 MG tablet TAKE ONE TABLET BY MOUTH ONCE DAILY  . atorvastatin (LIPITOR) 10 MG tablet TAKE ONE  TABLET BY MOUTH ONCE DAILY AT 6 PM  . bismuth subsalicylate (PEPTO BISMOL) 262 MG/15ML suspension Take  30 mLs by mouth every 6 (six) hours as needed for indigestion.  Marland Kitchen DILT-XR 180 MG 24 hr capsule TAKE 1 CAPSULE BY MOUTH ONCE DAILY  . ELIQUIS 2.5 MG TABS tablet TAKE ONE TABLET BY MOUTH TWICE DAILY  . levETIRAcetam (KEPPRA) 250 MG tablet Take 1 tablet (250 mg total) by mouth 2 (two) times daily.  Marland Kitchen oxybutynin (DITROPAN XL) 10 MG 24 hr tablet Take 1 tablet (10 mg total) by mouth at bedtime.  . pantoprazole (PROTONIX) 40 MG tablet Take 1 tablet (40 mg total) by mouth daily.  . sodium chloride (OCEAN) 0.65 % SOLN nasal spray Place 1 spray into both nostrils as needed for congestion.  . traZODone (DESYREL) 50 MG tablet Take 50 mg by mouth at bedtime.   . ciprofloxacin (CIPRO) 250 MG tablet Take 1 tablet (250 mg total) by mouth 2 (two) times daily. (Patient not taking: Reported on 09/06/2017)  . ibuprofen (ADVIL,MOTRIN) 600 MG tablet Take 1 tablet (600 mg total) by mouth every 6 (six) hours as needed. (Patient not taking: Reported on 09/06/2017)  . methocarbamol (ROBAXIN) 500 MG tablet Take 1 tablet (500 mg total) by mouth at bedtime as needed for muscle spasms. (Patient not taking: Reported on 09/06/2017)  . traMADol (ULTRAM) 50 MG tablet Take 1-2 tablets (50-100 mg total) by mouth every 6 (six) hours as needed for moderate pain or severe pain. (Patient not taking: Reported on 09/06/2017)    FAMILY HISTORY:  Her indicated that her mother is deceased. She indicated that her father is deceased. She indicated that her sister is alive. She indicated that her brother is alive. She indicated that the status of her neg hx is unknown. She indicated that the status of her unknown relative is unknown.   SOCIAL HISTORY: She  reports that she quit smoking about 23 years ago. She started smoking about 65 years ago. She has a 9.50 pack-year smoking history. she has never used smokeless tobacco. She reports that she does not drink alcohol or use drugs.  REVIEW OF SYSTEMS:   Full Review of Systems negative except  otherwise specified as above   VITAL SIGNS: BP (!) 141/64   Pulse 93   Temp 98.4 F (36.9 C) (Oral)   Resp (!) 28   Ht 5\' 7"  (1.702 m)   Wt 119 lb 0.8 oz (54 kg)   SpO2 94%   BMI 18.65 kg/m   VENTILATOR SETTINGS: FiO2 (%):  [100 %] 100 %  INTAKE / OUTPUT: I/O last 3 completed shifts: In: 2481.7 [P.O.:540; I.V.:1036.7; IV EXBMWUXLK:440] Out: 1027 [OZDGU:4403]  PHYSICAL EXAMINATION: Physical Exam  Constitutional: She is oriented to person, place, and time.  Respiratory distress sitting up in bed, nontoxic  HENT:  Head: Normocephalic and atraumatic.  Eyes: Right eye exhibits no discharge. Left eye exhibits no discharge.  Neck: No JVD present.  Cardiovascular: Normal rate.  No murmur heard. Pulmonary/Chest: No stridor.  Mild conversational dyspnea.+ Clavicular accessory muscle use.  Bibasilar and scattered crackles.  Respiratory rate fluctuates high 20s to low 30s.  On high flow nasal cannula 15 L  Abdominal: Soft. She exhibits no distension. There is no tenderness.  Musculoskeletal: She exhibits no edema.  Neurological: She is alert and oriented to person, place, and time.  Skin: Skin is warm and dry.    LABS:  BMET Recent Labs  Lab 09/09/17 [phone] 09/10/17 0308 09/11/17 5956  NA 139 137 135  K 3.8 3.9 4.0  CL 107 108 104  CO2 25 23 22   BUN 16 19 20   CREATININE 0.83 0.84 0.85  GLUCOSE 94 82 119*    Electrolytes Recent Labs  Lab 09/06/17 1649  09/07/17 1620  09/08/17 0716 09/09/17 0339 09/10/17 0308 09/11/17 0549  CALCIUM  --    < > 8.0*   < >  --  7.6* 7.5* 7.5*  MG 1.4*  --  1.7  --  2.0  --   --   --    < > = values in this interval not displayed.    CBC Recent Labs  Lab 09/09/17 0339 09/10/17 0308 09/11/17 0549  WBC 15.6* 8.9 18.6*  HGB 11.1* 10.9* 12.5  HCT 33.2* 32.7* 36.7  PLT 172 184 197    Coag's No results for input(s): APTT, INR in the last 168 hours.  Sepsis Markers Recent Labs  Lab 09/07/17 0347 09/11/17 0831  09/11/17 1119  LATICACIDVEN 1.7 1.4 2.4*  PROCALCITON  --  2.17  --     ABG Recent Labs  Lab 09/11/17 1255  PHART 7.529*  PCO2ART 31.7*  PO2ART 58.7*    Liver Enzymes Recent Labs  Lab 09/06/17 1102 09/07/17 0347 09/08/17 0336  AST 66* 110* 109*  ALT 34 48 58*  ALKPHOS 105 86 109  BILITOT 1.2 1.4* 1.2  ALBUMIN 2.6* 2.2* 2.2*    Cardiac Enzymes No results for input(s): TROPONINI, PROBNP in the last 168 hours.  Glucose No results for input(s): GLUCAP in the last 168 hours.  Imaging Ct Chest Wo Contrast  Result Date: 09/11/2017 CLINICAL DATA:  83 year old female with history of shortness of breath. EXAM: CT CHEST WITHOUT CONTRAST TECHNIQUE: Multidetector CT imaging of the chest was performed following the standard protocol without IV contrast. COMPARISON:  Chest CT 08/23/2017. FINDINGS: Cardiovascular: Heart size is mildly enlarged. There is no significant pericardial fluid, thickening or pericardial calcification. There is aortic atherosclerosis, as well as atherosclerosis of the great vessels of the mediastinum and the coronary arteries, including calcified atherosclerotic plaque in the left main, left anterior descending, left circumflex and right coronary arteries. Mediastinum/Nodes: No pathologically enlarged mediastinal or hilar lymph nodes. Please note that accurate exclusion of hilar adenopathy is limited on noncontrast CT scans. Esophagus is unremarkable in appearance. No axillary lymphadenopathy. Lungs/Pleura: Multifocal asymmetrically distributed airspace consolidation throughout the lungs bilaterally, concerning for multilobar pneumonia. Previously noted right upper lobe pulmonary nodule is now largely obscured by adjacent airspace disease and is not readily identifiable. Moderate bilateral pleural effusions. Near complete atelectasis of the left lower lobe with some additional dependent atelectasis in the right lower lobe. Upper Abdomen: Aortic atherosclerosis.  Musculoskeletal: There are no aggressive appearing lytic or blastic lesions noted in the visualized portions of the skeleton. IMPRESSION: 1. Findings are most compatible with severe multilobar bronchopneumonia. 2. Moderate bilateral pleural effusions with areas of dependent atelectasis in the lower lobes of lungs bilaterally. 3. Cardiomegaly. 4. Aortic atherosclerosis, in addition to left main and 3 vessel coronary artery disease. 5. Previously treated right upper lobe pulmonary nodule is now obscured by overlying airspace disease. Attention on followup studies is recommended. Aortic Atherosclerosis (ICD10-I70.0). Electronically Signed   By: Vinnie Langton M.D.   On: 09/11/2017 11:06   Dg Chest Port 1 View  Result Date: 09/11/2017 CLINICAL DATA:  83 year old female with history of fever for 1 day. EXAM: PORTABLE CHEST 1 VIEW COMPARISON:  Chest x-ray 09/07/2017. FINDINGS: Decreasing lung volumes with  worsening aeration throughout the mid to lower lungs bilaterally, with air bronchograms evident in the right lower lobe, concerning for progressive multilobar pneumonia. Small right and moderate left pleural effusions. Pulmonary vasculature is obscured. No pneumothorax. Cardiomegaly. The patient is rotated to the left on today's exam, resulting in distortion of the mediastinal contours and reduced diagnostic sensitivity and specificity for mediastinal pathology. Atherosclerosis in the thoracic aorta. IMPRESSION: 1. Findings are compatible with worsening severe multilobar pneumonia, most evident throughout the mid to lower lungs bilaterally. 2. Small right and moderate left parapneumonic pleural effusions. 3. Cardiomegaly. 4. Aortic atherosclerosis. Electronically Signed   By: Vinnie Langton M.D.   On: 09/11/2017 08:09     STUDIES:  CT Chest w/o 09/11/17: multifocal airspace opacities with areas of ground glass, moderate bilateral pleural effusions. Right upper lobe nodule  obscurred.  CULTURES: Respiratory viral panel negative Urine culture: >100k klebsiella 09/11/17 blood culture >>>  ANTIBIOTICS: cipro 11/20-11/21 Meropenem 11/21>>> Vancomycin 11/22-11/24 linezolid 11/25>>  SIGNIFICANT EVENTS: - transferred to MICU for worsening hypoxemia 11/25, improved on high flow  LINES/TUBES: PIV  DISCUSSION: Daughter updated at bedside  ASSESSMENT / PLAN: 83 year old female history of on apixaban, seizure disorder, documented COPD, right upper lobe lung cancer status post radiation therapy who initially presented 09/06/17 for confusion and hematemesis after being found down, s/p EGD suspected gastritis with no further bleeding.  Was been treated with broad spectrum antibiotics for presumed aspiration pneumonia with progressive hypoxemia for which she was transferred to the MICU 09/08/17  PULMONARY A: Hypoxemic respiratory failure due to aspiration pneumonia COPD, undocumented  P:   -Work of breathing and oxygen saturation both improved on high flow nasal cannula.  Wean for comfort and for oxygen saturation greater than 90%.  Goals of care discussed with daughter at bedside and patient who is able to participate in the conversation though her understanding is limited by her encephalopathy.  They both agreed that she would want intubation if it were necessary but when I want to be on prolonged life support if she would not seeing any improvement. -Needs a diagnostic and therapeutic thoracentesis however received dose of Apixaban 11/25 AM so will hold this and plan for thoracentesis 11/26 - gentle diuresis as renal function allows - continue scheduled duonebs, no wheezing on exam hold on steroids for now  CARDIOVASCULAR A:  Atrial fibrillation  P:  - continue home diltiazem - anticoagulation on hold as above  RENAL A:   No active issues   P:   - monitor renal function with diuresis  GASTROINTESTINAL A:   UGIB, suspected gastritis  P:   -  continue to monitor hemoglobin and for active bleeding -No indication for stress ulcer prophylaxis at this time   HEMATOLOGIC A:   No active issues   INFECTIOUS A:   Aspiration pneumonia  P:   -Continue empiric linezolid and meropenem per infectious disease recommendations.  Follow-up on blood cultures, urine strep.  Sputum cultures ordered.  Narrow antibiotics based on results  ENDOCRINE A:   No active issues  NEUROLOGIC A:   Metabolic encephalopathy - presumed due to sepsis, hospital delirium History of seizure disorder anxiety  P:   - continue home antiepileptics - continue low dose xanax for anxiety   FAMILY  - Updates: daughter updated 1/25  - Inter-disciplinary family meet or Palliative Care meeting due by:  day 7   Mali Kinzlee Selvy, MD Pulmonary and [place] Pager: [phone]  09/11/2017, 6:39 PM

## 2017-09-11 NOTE — Progress Notes (Signed)
Pt sats 88-90 on 15L high flow.  Changed pt to non rebreather sats 95%

## 2017-09-11 NOTE — Anesthesia Procedure Notes (Signed)
Procedure Name: Intubation Date/Time: 09/11/2017 8:53 PM Performed by: Clovis Cao, CRNA Pre-anesthesia Checklist: Patient identified, Emergency Drugs available, Suction available, Patient being monitored and Timeout performed Patient Re-evaluated:Patient Re-evaluated prior to induction Oxygen Delivery Method: Circle system utilized Preoxygenation: Pre-oxygenation with 100% oxygen Induction Type: IV induction, Rapid sequence and Cricoid Pressure applied Laryngoscope Size: Miller, 2 and Glidescope (DL x 1 with Sabra Heck 2- grade 4 view with no attempt made to place ETT. MAcGrath utilized for second DL- grade 2 view. ETT easily placed.) Grade View: Grade I Tube type: Subglottic suction tube Tube size: 8.0 mm Number of attempts: 1 Airway Equipment and Method: Stylet and Video-laryngoscopy Placement Confirmation: ETT inserted through vocal cords under direct vision,  positive ETCO2 and breath sounds checked- equal and bilateral Secured at: 22 cm Tube secured with: Tape Dental Injury: Teeth and Oropharynx as per pre-operative assessment

## 2017-09-11 NOTE — Progress Notes (Signed)
Pt arrived to 4N from IR at 2345

## 2017-09-11 NOTE — Anesthesia Procedure Notes (Signed)
Arterial Line Insertion Start/End11/25/2018 8:40 PM, 09/11/2017 8:56 PM Performed by: Clovis Cao, CRNA, CRNA  Preanesthetic checklist: patient identified, IV checked, risks and benefits discussed, monitors and equipment checked, pre-op evaluation and timeout performed Left was placed  Attempts: 3 (one atempt by Durene Romans, CRNA. two attempts by [facility], CRNA) Procedure performed without using ultrasound guided technique. Ultrasound Notes:anatomy identified, needle tip was noted to be adjacent to the nerve/plexus identified and no ultrasound evidence of intravascular and/or intraneural injection Following insertion, dressing applied and Biopatch. Patient tolerated the procedure well with no immediate complications.

## 2017-09-11 NOTE — Consult Note (Signed)
Requesting Physician: Dr. Dellie Catholic     Chief Complaint: Aphasia, right side weakness  History obtained from:    Patient and Chart     HPI:                                                                                                                                       Kayla Kent is an 83 y.o. female 83 y.o.femalewith medical history significant forstage III chronic kidney disease, dyslipidemia, chronic atrial fibrillation on Eliquis, hypertension, seizure disorder on AEDs, and recent FNA of thyroid nodule.   She was admittedfor GI bleed, and Eliquis was held. Restarted yesterday. Around 6:30 PM he was last seen normal during during shift change 7 PM the patient noted to be aphasic and not moving her right side and also respiratory distress. Stroke alert was called and CT head was obtained immediately did not show any hemorrhage Possible hyperdense MCA sign was noticed on the left side. Patient was allergic to contrast per chart, so CT angiogram was not performed. Patient was pre-medicated with hydrocortisone and Benadryl taken to NG tube for diagnostic angiogram with plan for mechanical thrombectomy if large vessel occlusion was.  Date last known well: 11.25.18 Time last known well: 6.30 pm tPA Given: no, on Eliquis NIHSS: 26 Baseline MRS 0    Past Medical History:  Diagnosis Date  . Allergic rhinitis   . Anxiety   . CKD (chronic kidney disease) stage 3, GFR 30-59 ml/min (HCC)   . Emphysema of lung ([place])   . HLD (hyperlipidemia)   . HTN (hypertension)   . Intolerance of drug    orthostatic  . Paroxysmal supraventricular tachycardia ([place])   . PVD (peripheral vascular disease) ([place])   . Seizure ([place])   . Syncope and collapse   . Uterine prolapse without mention of vaginal wall prolapse     Past Surgical History:  Procedure Laterality Date  . CARDIOVERSION N/A 11/05/2014   Procedure: CARDIOVERSION;  Surgeon: Candee Furbish, MD;  Location: [facility] ENDOSCOPY;  Service:  Cardiovascular;  Laterality: N/A;  . cataract surgery  08/2015  . CHOLECYSTECTOMY    . corrective eye surgery     as a child  . ESOPHAGOGASTRODUODENOSCOPY N/A 09/09/2017   Procedure: ESOPHAGOGASTRODUODENOSCOPY (EGD);  Surgeon: Mauri Pole, MD;  Location: [facility] ENDOSCOPY;  Service: Endoscopy;  Laterality: N/A;  . INTRAMEDULLARY (IM) NAIL INTERTROCHANTERIC Left 11/21/2016   Procedure: INTRAMEDULLARY (IM) NAIL INTERTROCHANTRIC HEMI;  Surgeon: Newt Minion, MD;  Location: [place];  Service: Orthopedics;  Laterality: Left;  . IVD removed    . OPEN REDUCTION INTERNAL FIXATION (ORIF) DISTAL RADIAL FRACTURE Right 08/29/2014   Procedure: OPEN REDUCTION INTERNAL FIXATION (ORIF) DISTAL RADIAL FRACTURE;  Surgeon: Marianna Payment, MD;  Location: [place];  Service: Orthopedics;  Laterality: Right;  . RF ablation PSVT     summer '10  . stress cardiolite  08/05/93  . TONSILLECTOMY    .  TOTAL HIP ARTHROPLASTY Right 08/29/2014   Procedure: Right Hip Hemi Arthroplasty;  Surgeon: Marianna Payment, MD;  Location: [place];  Service: Orthopedics;  Laterality: Right;  Hip procedure 1st wants [employer], [employer], [place].     Family History  Problem Relation Age of Onset  . Mental illness Father        suicide  . Arthritis Father   . Hyperlipidemia Sister   . Hypertension Sister   . Heart disease Brother        CAD/MI  . Hypertension Brother   . Hypertension Unknown        family hx  . Colon cancer Neg Hx   . Breast cancer Neg Hx   . Diabetes Neg Hx   . Stroke Neg Hx   . Cancer Neg Hx   . Lung disease Neg Hx    Social History:  reports that she quit smoking about 23 years ago. She started smoking about 65 years ago. She has a 9.50 pack-year smoking history. she has never used smokeless tobacco. She reports that she does not drink alcohol or use drugs.  Allergies:  Allergies  Allergen Reactions  . Chocolate Hives  . Codeine Other (See Comments)    Patient states she acts crazy  .  Diazepam Other (See Comments)    Patient states she acts crazy.  . Fruit & Vegetable Daily [Nutritional Supplements] Hives and Swelling    peaches  . Iohexol Hives     Code: HIVES, Desc: pt gets 13 hr pre-meds, Onset Date: [id number]   . Latex Hives  . Peach [Prunus Persica] Hives  . Peanut-Containing Drug Products Hives and Swelling  . Penicillins Hives, Itching and Swelling    Has patient had a PCN reaction causing immediate rash, facial/tongue/throat swelling, SOB or lightheadedness with hypotension: Yes Has patient had a PCN reaction causing severe rash involving mucus membranes or skin necrosis: No Has patient had a PCN reaction that required hospitalization No Has patient had a PCN reaction occurring within the last 10 years: No If all of the above answers are "NO", then may proceed with Cephalosporin use.   . Strawberry Extract Hives  . Sulfonamide Derivatives Hives    nausea  . Wheat Shortness Of Breath    Shortness of breath  . Wheat Bran Shortness Of Breath  . Sulfamethoxazole Hives  . Crestor [Rosuvastatin Calcium] Rash  . Iodine Rash    Medications:                                                                                                                        I reviewed home medications   ROS:  14 systems reviewed and negative except above   Examination:                                                                                                      General: Appears well-developed and well-nourished.  Psych: Affect appropriate to situation Eyes: No scleral injection HENT: No OP obstrucion Head: Normocephalic.  Cardiovascular: Normal rate and regular rhythm.  Respiratory: Effort normal and breath sounds normal to anterior ascultation GI: Soft.  No distension. There is no tenderness.  Skin: WDI   Neurological  Examination Mental Status: Alert, oriented, thought content appropriate.  Speech fluent without evidence of aphasia.  Able to follow 3 step commands without difficulty. Cranial Nerves: II: Visual field: Right homonymous hemianopsia  III,IV, VI: ptosis not present, forced gaze deviation to the left V,VII: Right facial droop VIII: hearing normal bilaterally IX,X: uvula rises symmetrically XI: bilateral shoulder shrug XII: midline tongue extension Motor: Right : Upper extremity   0/5    Left:     Upper extremity   5/5  Lower extremity   0/5     Lower extremity   5/5 Tone and bulk: right hemibody flaccid Sensory: neglect on right arm ,leg Deep Tendon Reflexes: 2+ and symmetric throughout Plantars: Right: downgoing   Left: downgoing Cerebellar: no ataxia on left side Gait: unable to assess      Lab Results: Basic Metabolic Panel: Recent Labs  Lab 09/06/17 1649  09/07/17 1620 09/08/17 0336 09/08/17 0716 09/09/17 0339 09/10/17 0308 09/11/17 0549  NA  --    < > 140 139  --  139 137 135  K  --    < > 4.6 3.2*  --  3.8 3.9 4.0  CL  --    < > 111 106  --  107 108 104  CO2  --    < > 18* 26  --  25 23 22   GLUCOSE  --    < > 84 91  --  94 82 119*  BUN  --    < > 22* 19  --  16 19 20   CREATININE  --    < > 1.33* 1.14*  --  0.83 0.84 0.85  CALCIUM  --    < > 8.0* 7.5*  --  7.6* 7.5* 7.5*  MG 1.4*  --  1.7  --  2.0  --   --   --    < > = values in this interval not displayed.    CBC: Recent Labs  Lab 09/07/17 0347 09/08/17 0336 09/09/17 0339 09/10/17 0308 09/11/17 0549  WBC 24.9* 20.5* 15.6* 8.9 18.6*  NEUTROABS  --   --   --  6.8 15.7*  HGB 11.1* 11.2* 11.1* 10.9* 12.5  HCT 33.2* 34.1* 33.2* 32.7* 36.7  MCV 94.3 92.9 91.7 91.3 90.8  PLT 173 164 172 184 197    Coagulation Studies: No results for input(s): LABPROT, INR in the last 72 hours.  Imaging: Ct Chest Wo Contrast  Result Date: 09/11/2017 CLINICAL DATA:  83 year old female with history of shortness of  breath. EXAM: CT CHEST WITHOUT CONTRAST TECHNIQUE: Multidetector CT imaging of the chest was performed following the standard protocol without IV contrast. COMPARISON:  Chest CT 08/23/2017. FINDINGS: Cardiovascular: Heart size is mildly enlarged. There is no significant pericardial fluid, thickening or pericardial calcification. There is aortic atherosclerosis, as well as atherosclerosis of the great vessels of the mediastinum and the coronary arteries, including calcified atherosclerotic plaque in the left main, left anterior descending, left circumflex and right coronary arteries. Mediastinum/Nodes: No pathologically enlarged mediastinal or hilar lymph nodes. Please note that accurate exclusion of hilar adenopathy is limited on noncontrast CT scans. Esophagus is unremarkable in appearance. No axillary lymphadenopathy. Lungs/Pleura: Multifocal asymmetrically distributed airspace consolidation throughout the lungs bilaterally, concerning for multilobar pneumonia. Previously noted right upper lobe pulmonary nodule is now largely obscured by adjacent airspace disease and is not readily identifiable. Moderate bilateral pleural effusions. Near complete atelectasis of the left lower lobe with some additional dependent atelectasis in the right lower lobe. Upper Abdomen: Aortic atherosclerosis. Musculoskeletal: There are no aggressive appearing lytic or blastic lesions noted in the visualized portions of the skeleton. IMPRESSION: 1. Findings are most compatible with severe multilobar bronchopneumonia. 2. Moderate bilateral pleural effusions with areas of dependent atelectasis in the lower lobes of lungs bilaterally. 3. Cardiomegaly. 4. Aortic atherosclerosis, in addition to left main and 3 vessel coronary artery disease. 5. Previously treated right upper lobe pulmonary nodule is now obscured by overlying airspace disease. Attention on followup studies is recommended. Aortic Atherosclerosis (ICD10-I70.0). Electronically  Signed   By: Vinnie Langton M.D.   On: 09/11/2017 11:06   Dg Chest Port 1 View  Result Date: 09/11/2017 CLINICAL DATA:  83 year old female with history of fever for 1 day. EXAM: PORTABLE CHEST 1 VIEW COMPARISON:  Chest x-ray 09/07/2017. FINDINGS: Decreasing lung volumes with worsening aeration throughout the mid to lower lungs bilaterally, with air bronchograms evident in the right lower lobe, concerning for progressive multilobar pneumonia. Small right and moderate left pleural effusions. Pulmonary vasculature is obscured. No pneumothorax. Cardiomegaly. The patient is rotated to the left on today's exam, resulting in distortion of the mediastinal contours and reduced diagnostic sensitivity and specificity for mediastinal pathology. Atherosclerosis in the thoracic aorta. IMPRESSION: 1. Findings are compatible with worsening severe multilobar pneumonia, most evident throughout the mid to lower lungs bilaterally. 2. Small right and moderate left parapneumonic pleural effusions. 3. Cardiomegaly. 4. Aortic atherosclerosis. Electronically Signed   By: Vinnie Langton M.D.   On: 09/11/2017 08:09   Ct Head Code Stroke Wo Contrast  Result Date: 09/11/2017 CLINICAL DATA:  Code stroke.  Right-sided weakness. EXAM: CT HEAD WITHOUT CONTRAST TECHNIQUE: Contiguous axial images were obtained from the base of the skull through the vertex without intravenous contrast. COMPARISON:  09/06/2017 CT of the head. FINDINGS: Brain: No evidence of acute infarction, hemorrhage, hydrocephalus, extra-axial collection or mass lesion/mass effect. Right anterior temporal lobe chronic encephalomalacia is unchanged. Foci of hypoattenuation predominantly in periventricular white matter is stable and compatible with mild chronic microvascular ischemic changes. Mild diffuse brain parenchymal volume loss is stable. Vascular: Calcific atherosclerosis of carotid siphons. Skull: Normal. Negative for fracture or focal lesion. Sinuses/Orbits: No  acute finding. Other: None. ASPECTS [facility] Stroke Program Early CT Score) - Ganglionic level infarction (caudate, lentiform nuclei, internal capsule, insula, M1-M3 cortex): 7 - Supraganglionic infarction (M4-M6 cortex): 3 Total score (0-10 with 10 being normal): 10 IMPRESSION: 1. No acute intracranial abnormality identified. 2. Stable right anterior temporal lobe encephalomalacia, chronic microvascular ischemic changes, and parenchymal volume loss  of the brain. 3. ASPECTS is 10 These results were called by telephone at the time of interpretation on 09/11/2017 at 8:04 pm to Dr. Lorraine Lax who verbally acknowledged these results. Electronically Signed   By: Kristine Garbe M.D.   On: 09/11/2017 20:04     ASSESSMENT AND PLAN   Left MCA syndrome Acute issues stroke secondary to atrial fibrillation Aphasia and right hemiplegia  CT head shows no hemorrhage, possible hyperdense MCA sign CTA head not done due to contrast allergy and very high likelihood she had a LVO Stat Hydrocortisone and Benadryl administered Patient taken to Angio - left distal M2 occlusion seen   Recommend # MRI of the brain without contrast #Transthoracic Echo  # Hold antiplatelet, Eliquis until 24 CT scan #continue Atorvastatin 80 mg/other high intensity statin # BP goal: Per recommendation of neuro IR team # HBAIC and Lipid profile # Telemetry monitoring # Frequent neuro checks # NPO until passes stroke swallow screen   If primary team once patient transferred to neurology please let us know.   I was involved in the diagnosis, treatment of this neurologically critical ill patient who presented with an acute ischemic stroke and large vessel occlusion. I explained to the family the diagnosis and plan for treatment. As involving the consultation of intervention neuroradiologist.  Karena Addison Aroor [facility] Pager Number [phone]    Please page stroke NP  Or  PA  Or MD from 8am -4 pm  as this  patient from this time will be  followed by the stroke.   You can look them up on [email or web address]  Password [email or web address]

## 2017-09-11 NOTE — Progress Notes (Signed)
During shift change, patient's oxygen saturation was noted to be dropping into the low 80s. Upon assessment, pt was found to be slumped to the right, with flaccid right upper and lower extremity. Her speech was unintelligible and she had a right facial droop. Code stroke called at 27. Pt placed on NRB and taken for stat CT scan. LSN [id number]. NIH 26

## 2017-09-11 NOTE — Procedures (Signed)
S/P mLt common carotid arteriogram,followed by complete revascularization of occluded lt MCA distal inferior division   With x 2 passes with solitaire 18mm x 40 mm FR retrieval device, and 4.5 mg of IA INTEGRELIN IA achieving a TICI 3 reperfusion.

## 2017-09-11 NOTE — Progress Notes (Signed)
Pt transferred to [place] per bed Family aware of transfer & daughter is here -daughter has personal belongings.

## 2017-09-11 NOTE — Progress Notes (Signed)
Called report to [place]  - pt to transfer to ICU

## 2017-09-11 NOTE — Transfer of Care (Signed)
Immediate Anesthesia Transfer of Care Note  Patient: Kayla Kent  Procedure(s) Performed: RADIOLOGY WITH ANESTHESIA (N/A )  Patient Location: ICU  Anesthesia Type:General  Level of Consciousness: sedated  Airway & Oxygen Therapy: Patient remains intubated per anesthesia plan and Patient placed on Ventilator (see vital sign flow sheet for setting)  Post-op Assessment: Report given to RN and Post -op Vital signs reviewed and stable  Post vital signs: Reviewed and stable  Last Vitals:  Vitals:   09/11/17 1930 09/11/17 2000  BP: (!) 174/82 (!) 164/78  Pulse: (!) 102 (!) 102  Resp: (!) 27 (!) 30  Temp:    SpO2: 93% (!) 87%    Last Pain:  Vitals:   09/11/17 1700  TempSrc: Oral  PainSc:          Complications: No apparent anesthesia complications

## 2017-09-11 NOTE — Plan of Care (Signed)
  Not Progressing Clinical Measurements: Diagnostic test results will improve 09/11/2017 0549 - Not Progressing by Tristine Langi A, RN Note Patient did spike another fever of greater than 101 overnight. Increased dose of tylenol ordered by MD.  Activity: Risk for activity intolerance will decrease 09/11/2017 0549 - Not Progressing by Justus Duerr A, RN Note Patient refused ambulation with PT during day shift; however, states she is willing to participate during day shift today.

## 2017-09-12 ENCOUNTER — Ambulatory Visit: Payer: Self-pay

## 2017-09-12 ENCOUNTER — Telehealth: Payer: Self-pay | Admitting: Internal Medicine

## 2017-09-12 ENCOUNTER — Encounter (HOSPITAL_COMMUNITY): Payer: Self-pay | Admitting: Interventional Radiology

## 2017-09-12 DIAGNOSIS — I639 Cerebral infarction, unspecified: Secondary | ICD-10-CM

## 2017-09-12 DIAGNOSIS — J9601 Acute respiratory failure with hypoxia: Secondary | ICD-10-CM

## 2017-09-12 DIAGNOSIS — I5032 Chronic diastolic (congestive) heart failure: Secondary | ICD-10-CM

## 2017-09-12 DIAGNOSIS — I10 Essential (primary) hypertension: Secondary | ICD-10-CM

## 2017-09-12 LAB — CBC WITH DIFFERENTIAL/PLATELET
BASOS PCT: 0 %
Basophils Absolute: 0 10*3/uL (ref 0.0–0.1)
EOS ABS: 0 10*3/uL (ref 0.0–0.7)
EOS PCT: 0 %
HCT: 29.5 % — ABNORMAL LOW (ref 36.0–46.0)
HEMOGLOBIN: 10 g/dL — AB (ref 12.0–15.0)
Lymphocytes Relative: 5 %
Lymphs Abs: 0.8 10*3/uL (ref 0.7–4.0)
MCH: 31.1 pg (ref 26.0–34.0)
MCHC: 33.9 g/dL (ref 30.0–36.0)
MCV: 91.6 fL (ref 78.0–100.0)
MONOS PCT: 2 %
Monocytes Absolute: 0.4 10*3/uL (ref 0.1–1.0)
NEUTROS PCT: 93 %
Neutro Abs: 16 10*3/uL — ABNORMAL HIGH (ref 1.7–7.7)
PLATELETS: 208 10*3/uL (ref 150–400)
RBC: 3.22 MIL/uL — ABNORMAL LOW (ref 3.87–5.11)
RDW: 15.5 % (ref 11.5–15.5)
WBC: 17.3 10*3/uL — ABNORMAL HIGH (ref 4.0–10.5)

## 2017-09-12 LAB — BLOOD GAS, ARTERIAL
Acid-Base Excess: 3.5 mmol/L — ABNORMAL HIGH (ref 0.0–2.0)
Acid-base deficit: 1.2 mmol/L (ref 0.0–2.0)
BICARBONATE: 23.7 mmol/L (ref 20.0–28.0)
Bicarbonate: 26.3 mmol/L (ref 20.0–28.0)
Drawn by: 105521
Drawn by: 252031
FIO2: 100
FIO2: 60
LHR: 15 {breaths}/min
MECHVT: 500 mL
O2 Saturation: 91.3 %
O2 Saturation: 93.2 %
PCO2 ART: 31.7 mmHg — AB (ref 32.0–48.0)
PEEP: 5 cmH2O
PH ART: 7.529 — AB (ref 7.350–7.450)
PO2 ART: 79.2 mmHg — AB (ref 83.0–108.0)
Patient temperature: 98.6
Patient temperature: 98.6
pCO2 arterial: 44 mmHg (ref 32.0–48.0)
pH, Arterial: 7.35 (ref 7.350–7.450)
pO2, Arterial: 58.7 mmHg — ABNORMAL LOW (ref 83.0–108.0)

## 2017-09-12 LAB — PROCALCITONIN: PROCALCITONIN: 1.9 ng/mL

## 2017-09-12 LAB — BASIC METABOLIC PANEL
Anion gap: 8 (ref 5–15)
BUN: 24 mg/dL — ABNORMAL HIGH (ref 6–20)
CALCIUM: 7.3 mg/dL — AB (ref 8.9–10.3)
CHLORIDE: 106 mmol/L (ref 101–111)
CO2: 22 mmol/L (ref 22–32)
CREATININE: 0.75 mg/dL (ref 0.44–1.00)
Glucose, Bld: 147 mg/dL — ABNORMAL HIGH (ref 65–99)
Potassium: 4.3 mmol/L (ref 3.5–5.1)
SODIUM: 136 mmol/L (ref 135–145)

## 2017-09-12 LAB — URINE CULTURE: Culture: NO GROWTH

## 2017-09-12 LAB — TRIGLYCERIDES: Triglycerides: 111 mg/dL (ref <150)

## 2017-09-12 LAB — STREP PNEUMONIAE URINARY ANTIGEN: STREP PNEUMO URINARY ANTIGEN: NEGATIVE

## 2017-09-12 LAB — MAGNESIUM: Magnesium: 1.8 mg/dL (ref 1.7–2.4)

## 2017-09-12 LAB — PHOSPHORUS: PHOSPHORUS: 4 mg/dL (ref 2.5–4.6)

## 2017-09-12 MED ORDER — LABETALOL HCL 5 MG/ML IV SOLN
10.0000 mg | INTRAVENOUS | Status: DC | PRN
  Administered 2017-09-12: 20 mg via INTRAVENOUS
  Administered 2017-09-14: 10 mg via INTRAVENOUS
  Filled 2017-09-12 (×2): qty 4

## 2017-09-12 MED ORDER — ORAL CARE MOUTH RINSE
15.0000 mL | OROMUCOSAL | Status: DC
  Administered 2017-09-12 – 2017-09-15 (×37): 15 mL via OROMUCOSAL

## 2017-09-12 MED ORDER — FUROSEMIDE 10 MG/ML IJ SOLN
40.0000 mg | Freq: Once | INTRAMUSCULAR | Status: AC
  Administered 2017-09-12: 40 mg via INTRAVENOUS
  Filled 2017-09-12: qty 4

## 2017-09-12 MED ORDER — HYDRALAZINE HCL 20 MG/ML IJ SOLN
10.0000 mg | INTRAMUSCULAR | Status: DC | PRN
  Administered 2017-09-14: 20 mg via INTRAVENOUS
  Filled 2017-09-12: qty 1

## 2017-09-12 MED ORDER — IPRATROPIUM-ALBUTEROL 0.5-2.5 (3) MG/3ML IN SOLN: 3.0000 mL | RESPIRATORY_TRACT | Status: DC | PRN

## 2017-09-12 MED ORDER — CHLORHEXIDINE GLUCONATE 0.12% ORAL RINSE (MEDLINE KIT)
15.0000 mL | Freq: Two times a day (BID) | OROMUCOSAL | Status: DC
  Administered 2017-09-12 – 2017-09-16 (×10): 15 mL via OROMUCOSAL

## 2017-09-12 MED ORDER — ATORVASTATIN CALCIUM 20 MG PO TABS
20.0000 mg | ORAL_TABLET | Freq: Every day | ORAL | Status: DC
  Administered 2017-09-12 – 2017-09-15 (×3): 20 mg via ORAL
  Filled 2017-09-12 (×3): qty 1

## 2017-09-12 NOTE — Progress Notes (Signed)
STROKE TEAM PROGRESS NOTE   HISTORY OF PRESENT ILLNESS (per record) Kayla Kent is an 83 y.o. female 83 y.o.femalewith medical history significant forstage III chronic kidney disease, dyslipidemia, chronic atrial fibrillation on Eliquis, hypertension, seizure disorder on AEDs, and recent FNA of thyroid nodule.   She was admittedfor GI bleed, and Eliquis was held. Restarted yesterday. Around 6:30 PM he was last seen normal during during shift change 7 PM the patient noted to be aphasic and not moving her right side and also respiratory distress. Stroke alert was called and CT head was obtained immediately did not show any hemorrhage Possible hyperdense MCA sign was noticed on the left side. Patient was allergic to contrast per chart, so CT angiogram was not performed. Patient was pre-medicated with hydrocortisone and Benadryl taken to NG tube for diagnostic angiogram with plan for mechanical thrombectomy if large vessel occlusion was.  Date last known well: 11.25.18 Time last known well: 6.30 pm tPA Given: no, on Eliquis NIHSS: 26 Baseline MRS 0     SUBJECTIVE (INTERVAL HISTORY) Her son ( ED MD from [place]) and daughter are present. The patient is intubated. She follows some commands.Bp well controlled.O2 sats are needing FiO2 60%   OBJECTIVE Temp:  [97.7 F (36.5 C)-99.5 F (37.5 C)] 97.7 F (36.5 C) (11/26 1200) Pulse Rate:  [37-110] 91 (11/26 1151) Cardiac Rhythm: Atrial fibrillation (11/26 0800) Resp:  [14-42] 27 (11/26 1151) BP: (88-174)/(38-104) 133/44 (11/26 1151) SpO2:  [76 %-98 %] 95 % (11/26 1130) Arterial Line BP: (115-162)/(33-48) 126/47 (11/26 1154) FiO2 (%):  [50 %-100 %] 60 % (11/26 1153) Weight:  [109 lb 12.6 oz (49.8 kg)-119 lb 0.8 oz (54 kg)] 109 lb 12.6 oz (49.8 kg) (11/26 0500)  CBC:  Recent Labs  Lab 09/11/17 0549 09/12/17 0549  WBC 18.6* 17.3*  NEUTROABS 15.7* 16.0*  HGB 12.5 10.0*  HCT 36.7 29.5*  MCV 90.8 91.6  PLT 197 208    Basic  Metabolic Panel:  Recent Labs  Lab 09/08/17 0716  09/11/17 0549 09/12/17 0549  NA  --    < > 135 136  K  --    < > 4.0 4.3  CL  --    < > 104 106  CO2  --    < > 22 22  GLUCOSE  --    < > 119* 147*  BUN  --    < > 20 24*  CREATININE  --    < > 0.85 0.75  CALCIUM  --    < > 7.5* 7.3*  MG 2.0  --   --  1.8  PHOS  --   --   --  4.0   < > = values in this interval not displayed.    Lipid Panel:     Component Value Date/Time   CHOL 132 10/30/2015 0931   TRIG 111 09/11/2017 2341   TRIG 141 10/25/2006 0906   HDL 33 (L) 10/30/2015 0931   CHOLHDL 4.0 10/30/2015 0931   VLDL 14 10/30/2015 0931   LDLCALC 85 10/30/2015 0931   HgbA1c:  Lab Results  Component Value Date   HGBA1C 6.0 04/13/2017   Urine Drug Screen:     Component Value Date/Time   LABOPIA NONE DETECTED 04/06/2017 1944   COCAINSCRNUR NONE DETECTED 04/06/2017 1944   LABBENZ POSITIVE (A) 04/06/2017 1944   AMPHETMU NONE DETECTED 04/06/2017 1944   THCU NONE DETECTED 04/06/2017 1944   LABBARB NONE DETECTED 04/06/2017 1944    Alcohol Level  Component Value Date/Time   ETH <5 04/06/2017 1907    IMAGING   Ct Head Wo Contrast 09/11/2017 IMPRESSION:  1. Small focus of contrast staining versus petechial hemorrhage at the medial aspect of the left frontal operculum.  2. No intraparenchymal hematoma, mass effect or hydrocephalus.     Ct Chest Wo Contrast 09/11/2017 IMPRESSION:  1. Findings are most compatible with severe multilobar bronchopneumonia.  2. Moderate bilateral pleural effusions with areas of dependent atelectasis in the lower lobes of lungs bilaterally.  3. Cardiomegaly.  4. Aortic atherosclerosis, in addition to left main and 3 vessel coronary artery disease.  5. Previously treated right upper lobe pulmonary nodule is now obscured by overlying airspace disease. Attention on followup studies is recommended.  Aortic Atherosclerosis (ICD10-I70.0).     Dg Chest Port 1  View 09/12/2017 IMPRESSION:  Appliances appear in satisfactory position. Again, there is diffuse bilateral pulmonary infiltration with small left pleural effusion. Changes likely represent multifocal pneumonia, ARDS, or edema.    Dg Chest Port 1 View 09/11/2017 IMPRESSION:  1. Findings are compatible with worsening severe multilobar pneumonia, most evident throughout the mid to lower lungs bilaterally.  2. Small right and moderate left parapneumonic pleural effusions.  3. Cardiomegaly.  4. Aortic atherosclerosis.    Ct Head Code Stroke Wo Contrast 09/11/2017 IMPRESSION:  1. No acute intracranial abnormality identified.  2. Stable right anterior temporal lobe encephalomalacia, chronic microvascular ischemic changes, and parenchymal volume loss of the brain.  3. ASPECTS is 10    Cerbral  Angiogram - Dr. Estanislado Pandy 09/11/2017 S/P mLt common carotid arteriogram,followed by complete revascularization of occluded lt MCA distal inferior division   With x 2 passes with solitaire 43mm x 40 mm FR retrieval device, and 4.5 mg of IA INTEGRELIN IA achieving a  TICI 3 reperfusion.    Transthoracic Echocardiogram  04/08/2017 Study Conclusions  - Left ventricle: The cavity size was normal. Wall thickness was   normal. Systolic function was normal. The estimated ejection   fraction was in the range of 55% to 60%. Doppler parameters are   consistent with both elevated ventricular end-diastolic filling   pressure and elevated left atrial filling pressure. - Aortic valve: There was trivial regurgitation. - Mitral valve: There was mild regurgitation. Valve area by   continuity equation (using LVOT flow): 1.22 cm^2. - Right atrium: The atrium was mildly dilated. - Atrial septum: There was a patent foramen ovale. - Pulmonary arteries: PA peak pressure: 49 mm Hg (S). - Pericardium, extracardiac: A trivial pericardial effusion was   identified.   Right LE Venous Dopplers 04/09/2017 No  evidence of deep vein thrombosis involving the right lower extremity.      PHYSICAL EXAM Vitals:   09/12/17 1100 09/12/17 1130 09/12/17 1151 09/12/17 1200  BP: (!) 123/44 (!) 132/51 (!) 133/44   Pulse: 91 99 91   Resp: (!) 25 (!) 27 (!) 27   Temp:    97.7 F (36.5 C)  TempSrc:    Axillary  SpO2: 96% 95%    Weight:      Height:       Frail elderly caucasian lady who is intubated. . Afebrile. Head is nontraumatic. Neck is supple without bruit.    Cardiac exam no murmur or gallop. Lungs are clear to auscultation. Distal pulses are well felt. Neurological Exam :  Awake. Left gaze deviation. Unable to look to the right and cross midline. Blinks to threat on the left but not on the right. Mild right lower  facial weakness. Tongue midline. Follows only occasional midline and few 1 step commands on the left. Right upper extremity is flaccid and weak with only trace withdrawal to pain. Able to move right lower extremity slightly but has weakness. Purposeful antigravity movement on the left side. Deep tendon reflexes are depressed in the right and normal on the left. Right plantar upgoing left downgoing.  ASSESSMENT/PLAN Kayla Kent is a 83 y.o. female with history of syncope, seizure disorder, PFO from echo 04/08/2017, peripheral vascular disease, atrial fibrillation on Eliquis, recent GI bleed, hypertension, recent thyroid biopsy, hyperlipidemia, emphysema, chronic kidney disease, and anxiety presenting with right-sided weakness, aphasia, and respiratory distress. She did not receive IV t-PA due to anticoagulation.  Cerbral  Angiogram / Intervention - Dr. Estanislado Pandy 09/11/2017 S/P mLt common carotid arteriogram,followed by complete revascularization of occluded lt MCA distal inferior division   With x 2 passes with solitaire 59mm x 40 mm FR retrieval device, and 4.5 mg of IA INTEGRELIN IA achieving a  TICI 3 reperfusion.  Stroke:  Left middle cerebral artery - embolic secondary to atrial  fibrillation. Eliquis recently held for GI bleed.  Resultant  Mild aphasia, right visual field loss and right hemiplegia  CT head - Small focus of contrast staining vs petechial hemorrhage at the medial aspect of the lt frontal operculum.   MRI head - - not performed  MRA head - not performed  Carotid Doppler - cerebral angiogram  2D Echo - 04/08/2017 - EF 55-60% - PFO noted  LDL - 85  HgbA1c - will order - pending  VTE prophylaxis - SCDs Diet NPO time specified  Eliquis (apixaban) daily prior to admission, now on No antithrombotic  Patient will be counseled to be compliant with her antithrombotic medications  Ongoing aggressive stroke risk factor management  Therapy recommendations:  pending  Disposition:  Pending  Hypertension  Stable  Permissive hypertension (OK if < 220/120) but gradually normalize in 5-7 days  Long-term BP goal normotensive  Hyperlipidemia  Home meds:  Lipitor 10 mg daily not resumed in hospital  LDL 85, goal < 70  Increase Lipitor to 20 mg daily  Continue statin at discharge   Other Stroke Risk Factors  Advanced age  Former cigarette smoker - quit 23 years ago  PFO  Afib on Eliquis (held for GI bleed)  Other Active Problems  Multi lobar pneumonia  Anemia - 10 / 29.5  CKD  Contrast allergy  Seizure disorder - Keppra  Currently not anticoagulated and not on antiplatelet therapy. (Recent upper GI bleed)   Plan / Recommendations   Hold antiplatelet,  until  MRI resulted Strict Bo control SBP goal 120-140 x 24 hrs then below 160 Extubate as tolerated per PCCM. D/w Dr [facility] day # 6  I have personally examined this patient, reviewed notes, independently viewed imaging studies, participated in medical decision making and plan of care.ROS completed by me personally and pertinent positives fully documented  I have made any additions or clarifications directly to the above note.  She presented with left M2  occlusion secondary to atrial fibrillation while anticoagulation was on hold due to recent GI bleed. She underwent emergent revascularization with mechanical thrombectomy and remains at risk for recurrent strokes. Plan to checkt MRI scan today and if there  is no significant hemorrhage start antiplatelet therapy with aspirin initially. Long discussion at the bedside with patient's son as well as daughter and answered questions. Discussed with Dr. Sabra Heck. This patient is critically  ill and at significant risk of neurological worsening, death and care requires constant monitoring of vital signs, hemodynamics,respiratory and cardiac monitoring, extensive review of multiple databases, frequent neurological assessment, discussion with family, other specialists and medical decision making of high complexity.I have made any additions or clarifications directly to the above note.This critical care time does not reflect procedure time, or teaching time or supervisory time of PA/NP/Med Resident etc but could involve care discussion time.  I spent 40 minutes of neurocritical care time  in the care of  this patient.     Antony Contras, MD Medical Director [facility] Pager: [phone] 09/12/2017 2:46 PM   To contact Stroke Continuity provider, please refer to [email or web address] After hours, contact General Neurology

## 2017-09-12 NOTE — Addendum Note (Signed)
Addendum  created 09/12/17 0520 by Roderic Palau, MD   Sign clinical note

## 2017-09-12 NOTE — Progress Notes (Signed)
PT Cancellation Note  Patient Details Name: Kayla Kent MRN: [id number] DOB: 12-18-1931   Cancelled Treatment:    Reason Eval/Treat Not Completed: Medical issues which prohibited therapy(pt with acute cVA 11/25 and intubated. Will need MD clearance to proceed with mobility)   Rutger Salton B Dannell Raczkowski 09/12/2017, 7:19 AM  Elwyn Reach, [place]

## 2017-09-12 NOTE — Progress Notes (Signed)
Kayla Kent is a 83 yo female with a hx of CKD, HTN, Emphysema, HLD, PVD, seizure, and SVT. She was originally admitted 09/06/2017 for GI bleed, her Eliquis was held and restarted yesterday. Pt was transferred to 4N 09/11/2017 for respiratory distress. She was CXK 4818, after shift change [employer] noted a Left side facial droop, pt aphasic, not moving her Right side, and increase respiratory distress with sats dropping to mid 80's. Unable to obtain CTA due to contrast allergy. Pt pre-medicated with Benadryl and hydrocortisone. Pt arrived in IR at 1946 accompanied by Corrie Dandy, Megan RN, and [facility] [place] [facility] RRT. NIH 26.

## 2017-09-12 NOTE — Progress Notes (Signed)
Progress Note  Patient Name: Kayla Kent Date of Encounter: 09/12/2017  Primary Cardiologist: Harrington Challenger  Subjective   Events of yesterday evening noted.  Patient suffered acute left inferior division of MCA stroke, was intubated, went to emergency mechanical thrombectomy, with successful reestablishment of flow. Remains intubated, sedated.  No family present at bedside. Remains in atrial fibrillation with borderline ventricular rate control heart rate around 95-100. Apixaban had been on hold from admission November 23 yesterday November 25 when it was resumed.  Now no longer on anticoagulation following her procedure.   Inpatient Medications    Scheduled Meds: . chlorhexidine gluconate (MEDLINE KIT)  15 mL Mouth Rinse BID  . furosemide  80 mg Intravenous Once  . ipratropium-albuterol  3 mL Nebulization Q4H  . levETIRAcetam  250 mg Oral BID  . mouth rinse  15 mL Mouth Rinse 10 times per day  . pantoprazole (PROTONIX) IV  40 mg Intravenous Q24H  . senna-docusate  1 tablet Per NG tube QHS  . sodium chloride flush  3 mL Intravenous Q12H   Continuous Infusions: . sodium chloride    . sodium chloride 75 mL/hr at 09/12/17 1000  . linezolid (ZYVOX) IV 600 mg (09/12/17 1015)  . meropenem (MERREM) IV Stopped (09/12/17 0546)  . niCARDipine Stopped (09/12/17 8366)  . propofol (DIPRIVAN) infusion Stopped (09/12/17 0732)   PRN Meds: sodium chloride, acetaminophen **OR** acetaminophen (TYLENOL) oral liquid 160 mg/5 mL **OR** acetaminophen, fentaNYL (SUBLIMAZE) injection, fentaNYL (SUBLIMAZE) injection, hydrALAZINE, labetalol, ondansetron (ZOFRAN) IV, sodium chloride   Vital Signs    Vitals:   09/12/17 0830 09/12/17 0900 09/12/17 0930 09/12/17 1000  BP: (!) 117/47 (!) 110/58 (!) 102/45 (!) 124/45  Pulse: 82 74 82 89  Resp: (!) 24 17 20  (!) 27  Temp:      TempSrc:      SpO2: 95% 93% 93% 95%  Weight:      Height:        Intake/Output Summary (Last 24 hours) at 09/12/2017  1046 Last data filed at 09/12/2017 1000 Gross per 24 hour  Intake 2264.52 ml  Output 1500 ml  Net 764.52 ml   Filed Weights   09/11/17 0424 09/11/17 1700 09/12/17 0500  Weight: 120 lb (54.4 kg) 119 lb 0.8 oz (54 kg) 109 lb 12.6 oz (49.8 kg)    Telemetry    Atrial fibrillation with borderline ventricular rate control- Personally Reviewed  ECG    No new tracing- Personally Reviewed  Physical Exam  Intubated, sedated, appears thin and frail GEN: No acute distress.   Neck: No JVD Cardiac:  Irregular, no murmurs, rubs, or gallops.  Respiratory: Clear to auscultation bilaterally. GI: Soft, nontender, non-distended  MS: No edema; No deformity. Neuro:   Unable to evaluate Psych: Unable to evaluate  Labs    Chemistry Recent Labs  Lab 09/06/17 1102 09/07/17 0347  09/08/17 0336  09/10/17 0308 09/11/17 0549 09/12/17 0549  NA 138 139   < > 139   < > 137 135 136  K 3.8 4.0   < > 3.2*   < > 3.9 4.0 4.3  CL 102 109   < > 106   < > 108 104 106  CO2 26 23   < > 26   < > 23 22 22   GLUCOSE 106* 65   < > 91   < > 82 119* 147*  BUN 31* 22*   < > 19   < > 19 20 24*  CREATININE 1.51* 1.07*   < >  1.14*   < > 0.84 0.85 0.75  CALCIUM 8.4* 7.8*   < > 7.5*   < > 7.5* 7.5* 7.3*  PROT 5.1* 4.7*  --  5.3*  --   --   --   --   ALBUMIN 2.6* 2.2*  --  2.2*  --   --   --   --   AST 66* 110*  --  109*  --   --   --   --   ALT 34 48  --  58*  --   --   --   --   ALKPHOS 105 86  --  109  --   --   --   --   BILITOT 1.2 1.4*  --  1.2  --   --   --   --   GFRNONAA 31* 47*   < > 43*   < > >60 >60 >60  GFRAA 36* 54*   < > 50*   < > >60 >60 >60  ANIONGAP 10 7   < > 7   < > 6 9 8    < > = values in this interval not displayed.     Hematology Recent Labs  Lab 09/10/17 0308 09/11/17 0549 09/12/17 0549  WBC 8.9 18.6* 17.3*  RBC 3.58* 4.04 3.22*  HGB 10.9* 12.5 10.0*  HCT 32.7* 36.7 29.5*  MCV 91.3 90.8 91.6  MCH 30.4 30.9 31.1  MCHC 33.3 34.1 33.9  RDW 15.3 15.2 15.5  PLT 184 197 208     Cardiac EnzymesNo results for input(s): TROPONINI in the last 168 hours. No results for input(s): TROPIPOC in the last 168 hours.   BNP Recent Labs  Lab 09/06/17 1649  BNP 722.5*     DDimer No results for input(s): DDIMER in the last 168 hours.   Radiology    Ct Head Wo Contrast  Result Date: 09/11/2017 CLINICAL DATA:  Status post stroke intervention. EXAM: CT HEAD WITHOUT CONTRAST TECHNIQUE: Contiguous axial images were obtained from the base of the skull through the vertex without intravenous contrast. COMPARISON:  Head CT 09/11/2017 Cervical angiogram 09/11/2017 FINDINGS: Brain: There is a small amount of petechial hemorrhage versus contrast staining of the medial aspect of the left frontal operculum, coronal image 38. There is no intraparenchymal hematoma. No mass effect. No extra-axial collection. Anterior right temporal lobe encephalomalacia is unchanged. There is periventricular hypoattenuation compatible with chronic microvascular disease. No hydrocephalus. Vascular: Intravascular hyperattenuation is likely persisting contrast material. Skull: Normal visualized skull base, calvarium and extracranial soft tissues. Sinuses/Orbits: No sinus fluid levels or advanced mucosal thickening. No mastoid effusion. Normal orbits. IMPRESSION: 1. Small focus of contrast staining versus petechial hemorrhage at the medial aspect of the left frontal operculum. 2. No intraparenchymal hematoma, mass effect or hydrocephalus. Electronically Signed   By: Ulyses Jarred M.D.   On: 09/11/2017 23:42   Ct Chest Wo Contrast  Result Date: 09/11/2017 CLINICAL DATA:  83 year old female with history of shortness of breath. EXAM: CT CHEST WITHOUT CONTRAST TECHNIQUE: Multidetector CT imaging of the chest was performed following the standard protocol without IV contrast. COMPARISON:  Chest CT 08/23/2017. FINDINGS: Cardiovascular: Heart size is mildly enlarged. There is no significant pericardial fluid, thickening or  pericardial calcification. There is aortic atherosclerosis, as well as atherosclerosis of the great vessels of the mediastinum and the coronary arteries, including calcified atherosclerotic plaque in the left main, left anterior descending, left circumflex and right coronary arteries. Mediastinum/Nodes: No pathologically enlarged mediastinal  or hilar lymph nodes. Please note that accurate exclusion of hilar adenopathy is limited on noncontrast CT scans. Esophagus is unremarkable in appearance. No axillary lymphadenopathy. Lungs/Pleura: Multifocal asymmetrically distributed airspace consolidation throughout the lungs bilaterally, concerning for multilobar pneumonia. Previously noted right upper lobe pulmonary nodule is now largely obscured by adjacent airspace disease and is not readily identifiable. Moderate bilateral pleural effusions. Near complete atelectasis of the left lower lobe with some additional dependent atelectasis in the right lower lobe. Upper Abdomen: Aortic atherosclerosis. Musculoskeletal: There are no aggressive appearing lytic or blastic lesions noted in the visualized portions of the skeleton. IMPRESSION: 1. Findings are most compatible with severe multilobar bronchopneumonia. 2. Moderate bilateral pleural effusions with areas of dependent atelectasis in the lower lobes of lungs bilaterally. 3. Cardiomegaly. 4. Aortic atherosclerosis, in addition to left main and 3 vessel coronary artery disease. 5. Previously treated right upper lobe pulmonary nodule is now obscured by overlying airspace disease. Attention on followup studies is recommended. Aortic Atherosclerosis (ICD10-I70.0). Electronically Signed   By: Vinnie Langton M.D.   On: 09/11/2017 11:06   Dg Chest Port 1 View  Result Date: 09/12/2017 CLINICAL DATA:  Endotracheal and OG tube placements EXAM: PORTABLE CHEST 1 VIEW COMPARISON:  09/11/2017 FINDINGS: Interval placement of an endotracheal tube with tip measuring 4.5 cm above the  carina. An enteric tube has been placed. The tip is off the field of view but is well below the left hemidiaphragm. Heart size is normal. Again, there is diffuse bilateral airspace disease with a mostly perihilar distribution. Probable left pleural effusion. No pneumothorax. Calcification of the aorta. IMPRESSION: Appliances appear in satisfactory position. Again, there is diffuse bilateral pulmonary infiltration with small left pleural effusion. Changes likely represent multifocal pneumonia, ARDS, or edema. Electronically Signed   By: Lucienne Capers M.D.   On: 09/12/2017 00:20   Dg Chest Port 1 View  Result Date: 09/11/2017 CLINICAL DATA:  83 year old female with history of fever for 1 day. EXAM: PORTABLE CHEST 1 VIEW COMPARISON:  Chest x-ray 09/07/2017. FINDINGS: Decreasing lung volumes with worsening aeration throughout the mid to lower lungs bilaterally, with air bronchograms evident in the right lower lobe, concerning for progressive multilobar pneumonia. Small right and moderate left pleural effusions. Pulmonary vasculature is obscured. No pneumothorax. Cardiomegaly. The patient is rotated to the left on today's exam, resulting in distortion of the mediastinal contours and reduced diagnostic sensitivity and specificity for mediastinal pathology. Atherosclerosis in the thoracic aorta. IMPRESSION: 1. Findings are compatible with worsening severe multilobar pneumonia, most evident throughout the mid to lower lungs bilaterally. 2. Small right and moderate left parapneumonic pleural effusions. 3. Cardiomegaly. 4. Aortic atherosclerosis. Electronically Signed   By: Vinnie Langton M.D.   On: 09/11/2017 08:09   Ct Head Code Stroke Wo Contrast  Result Date: 09/11/2017 CLINICAL DATA:  Code stroke.  Right-sided weakness. EXAM: CT HEAD WITHOUT CONTRAST TECHNIQUE: Contiguous axial images were obtained from the base of the skull through the vertex without intravenous contrast. COMPARISON:  09/06/2017 CT of the  head. FINDINGS: Brain: No evidence of acute infarction, hemorrhage, hydrocephalus, extra-axial collection or mass lesion/mass effect. Right anterior temporal lobe chronic encephalomalacia is unchanged. Foci of hypoattenuation predominantly in periventricular white matter is stable and compatible with mild chronic microvascular ischemic changes. Mild diffuse brain parenchymal volume loss is stable. Vascular: Calcific atherosclerosis of carotid siphons. Skull: Normal. Negative for fracture or focal lesion. Sinuses/Orbits: No acute finding. Other: None. ASPECTS [facility] Stroke Program Early CT Score) - Ganglionic level infarction (caudate,  lentiform nuclei, internal capsule, insula, M1-M3 cortex): 7 - Supraganglionic infarction (M4-M6 cortex): 3 Total score (0-10 with 10 being normal): 10 IMPRESSION: 1. No acute intracranial abnormality identified. 2. Stable right anterior temporal lobe encephalomalacia, chronic microvascular ischemic changes, and parenchymal volume loss of the brain. 3. ASPECTS is 10 These results were called by telephone at the time of interpretation on 09/11/2017 at 8:04 pm to Dr. Lorraine Lax who verbally acknowledged these results. Electronically Signed   By: Kristine Garbe M.D.   On: 09/11/2017 20:04    Cardiac Studies   04/22/2017 Lexiscan Myoview Probable normal perfusion an mild soft tissue attenuation No significant ischemia. Images not gated due to atrial fibrillation. There are no significant changes in comparison to the prior study.  04/08/2017 Echo - Left ventricle: The cavity size was normal. Wall thickness was   normal. Systolic function was normal. The estimated ejection   fraction was in the range of 55% to 60%. Doppler parameters are   consistent with both elevated ventricular end-diastolic filling   pressure and elevated left atrial filling pressure. - Aortic valve: There was trivial regurgitation. - Mitral valve: There was mild regurgitation. Valve area by    continuity equation (using LVOT flow): 1.22 cm^2. - Right atrium: The atrium was mildly dilated. - Atrial septum: There was a patent foramen ovale. - Pulmonary arteries: PA peak pressure: 49 mm Hg (S). - Pericardium, extracardiac: A trivial pericardial effusion was   identified.  Patient Profile     83 y.o. female presenting with permanent atrial fibrillation, hypertension, PAD, presenting with acute upper GI bleed and (possible aspiration) multilobar pneumonia, had just resumed anticoagulation after a 5-day pause for bleeding when she suffered an acute left MCA ischemic stroke on September 11, 2017, treated with emergency mechanical thrombectomy.  Assessment & Plan    1. AFib: Rate control is adequate considering her multiple medical problems.  High risk of recurrent embolic events.  Defer restarting full anticoagulation to neurology, but from our standpoint would prefer this to occur as soon as possible.  She is currently on a nicardipine drip for BP management and prevention of cerebral vasospasm, but I suspect should be able to achieve similar blood pressure control and prevention of vasospasm using diltiazem. Will switch to immediate release diltiazem Q8h per NGT. 2. UGI bleed: EGD showed gastritis and small hiatal hernia and plan was to resume full anticoagulation. 3. Sepsis/pneumonia: Now intubated.  Hemodynamics appear stable without pressors.  Lactic acid elevation was minimal.  On antibiotics. 4. CHF: Very difficult to assess her volume status currently.  Chest x-ray abnormalities may be due to pulmonary edema, infection, aspiration or ARDS.  She was judged to be close to euvolemic status prior to her stroke.  Note that her weight today is reportedly 10 pounds lower than yesterday, not sure if weights have been accurately recorded.  3.2 L net positive fluid balance since admission.  For questions or updates, please contact [place] Please consult [email or web address] for contact info under  Cardiology/STEMI.      Signed, Sanda Klein, MD  09/12/2017, 10:46 AM

## 2017-09-12 NOTE — Progress Notes (Signed)
OT Cancellation    09/12/17 0900  OT Visit Information  Last OT Received On 09/12/17  Reason Eval/Treat Not Completed Medical issues which prohibited therapy. Pt with acute CVA on 11/25 and intubated. Pt on bedrest and will await increase in mobility orders. Thank you.   [place], OTR/L Acute Rehab Pager: [phone] Office: [phone]

## 2017-09-12 NOTE — Telephone Encounter (Signed)
FYI:  Daughter has cancelled appt for tomorrow due to being hospitalized.

## 2017-09-12 NOTE — Progress Notes (Signed)
[facility]    Date of Admission:  09/06/2017   Total days of antibiotics 7        Day 2 linezolid        Day 6 meropenem           ID: Kayla Kent is a 83 y.o. female with initial admit for upper gi bleed but also having progress oxygen needs found to have multifocal pneumonia thought to be due to aspiration pneumonia in the setting of new aphasia and right hemiplegia yesterday requiring medical thrombectomy. Patient intubated last night.  Principal Problem:   Acute upper GI bleed Active Problems:   Paroxysmal atrial fibrillation (HCC)   HTN (hypertension)   Seizure disorder (HCC)   Chronic atrial fibrillation (HCC)   Abnormal urinalysis   Thyroid nodule   CKD (chronic kidney disease), stage III (HCC)   Dyslipidemia   Emphysema of lung (HCC)   Heme positive stool   Pressure injury of skin   Melena   Acute hypoxemic respiratory failure (HCC)    Subjective: Remains sedated and intubated, fever curve trending down  Medications:  . atorvastatin  20 mg Oral q1800  . chlorhexidine gluconate (MEDLINE KIT)  15 mL Mouth Rinse BID  . levETIRAcetam  250 mg Oral BID  . mouth rinse  15 mL Mouth Rinse 10 times per day  . pantoprazole (PROTONIX) IV  40 mg Intravenous Q24H  . senna-docusate  1 tablet Per NG tube QHS  . sodium chloride flush  3 mL Intravenous Q12H    Objective: Vital signs in last 24 hours: Temp:  [97.7 F (36.5 C)-99.5 F (37.5 C)] 97.7 F (36.5 C) (11/26 1200) Pulse Rate:  [37-102] 91 (11/26 1900) Resp:  [14-27] 16 (11/26 1900) BP: (88-147)/(35-77) 111/50 (11/26 1900) SpO2:  [91 %-100 %] 99 % (11/26 1900) Arterial Line BP: (109-162)/(33-48) 151/44 (11/26 1900) FiO2 (%):  [50 %-60 %] 50 % (11/26 1536) Weight:  [109 lb 12.6 oz (49.8 kg)] 109 lb 12.6 oz (49.8 kg) (11/26 0500) Physical Exam  Constitutional:  Intubated, sedated appears easily arousable HENT: [place]/AT, PERRLA, no scleral icterus Mouth/Throat: OETT in place Cors =  nl s1,s2 Pulmonary/Chest: Effort normal and breath sounds normal. No respiratory distress.  has no wheezes.  Abdominal: Soft. Bowel sounds are normal.  exhibits no distension. There is no tenderness.  Neurological: easily moves right arm to stimuli, no movement to LUE  Lab Results Recent Labs    09/11/17 0549 09/12/17 0549  WBC 18.6* 17.3*  HGB 12.5 10.0*  HCT 36.7 29.5*  NA 135 136  K 4.0 4.3  CL 104 106  CO2 22 22  BUN 20 24*  CREATININE 0.85 0.75   Liver Panel No results for input(s): PROT, ALBUMIN, AST, ALT, ALKPHOS, BILITOT, BILIDIR, IBILI in the last 72 hours. Sedimentation Rate No results for input(s): ESRSEDRATE in the last 72 hours. C-Reactive Protein No results for input(s): CRP in the last 72 hours.  Microbiology:  Studies/Results: Ct Head Wo Contrast  Result Date: 09/11/2017 CLINICAL DATA:  Status post stroke intervention. EXAM: CT HEAD WITHOUT CONTRAST TECHNIQUE: Contiguous axial images were obtained from the base of the skull through the vertex without intravenous contrast. COMPARISON:  Head CT 09/11/2017 Cervical angiogram 09/11/2017 FINDINGS: Brain: There is a small amount of petechial hemorrhage versus contrast staining of the medial aspect of the left frontal operculum, coronal image 38. There is no intraparenchymal hematoma. No mass effect. No extra-axial collection. Anterior right temporal  lobe encephalomalacia is unchanged. There is periventricular hypoattenuation compatible with chronic microvascular disease. No hydrocephalus. Vascular: Intravascular hyperattenuation is likely persisting contrast material. Skull: Normal visualized skull base, calvarium and extracranial soft tissues. Sinuses/Orbits: No sinus fluid levels or advanced mucosal thickening. No mastoid effusion. Normal orbits. IMPRESSION: 1. Small focus of contrast staining versus petechial hemorrhage at the medial aspect of the left frontal operculum. 2. No intraparenchymal hematoma, mass effect or  hydrocephalus. Electronically Signed   By: Ulyses Jarred M.D.   On: 09/11/2017 23:42   Ct Chest Wo Contrast  Result Date: 09/11/2017 CLINICAL DATA:  83 year old female with history of shortness of breath. EXAM: CT CHEST WITHOUT CONTRAST TECHNIQUE: Multidetector CT imaging of the chest was performed following the standard protocol without IV contrast. COMPARISON:  Chest CT 08/23/2017. FINDINGS: Cardiovascular: Heart size is mildly enlarged. There is no significant pericardial fluid, thickening or pericardial calcification. There is aortic atherosclerosis, as well as atherosclerosis of the great vessels of the mediastinum and the coronary arteries, including calcified atherosclerotic plaque in the left main, left anterior descending, left circumflex and right coronary arteries. Mediastinum/Nodes: No pathologically enlarged mediastinal or hilar lymph nodes. Please note that accurate exclusion of hilar adenopathy is limited on noncontrast CT scans. Esophagus is unremarkable in appearance. No axillary lymphadenopathy. Lungs/Pleura: Multifocal asymmetrically distributed airspace consolidation throughout the lungs bilaterally, concerning for multilobar pneumonia. Previously noted right upper lobe pulmonary nodule is now largely obscured by adjacent airspace disease and is not readily identifiable. Moderate bilateral pleural effusions. Near complete atelectasis of the left lower lobe with some additional dependent atelectasis in the right lower lobe. Upper Abdomen: Aortic atherosclerosis. Musculoskeletal: There are no aggressive appearing lytic or blastic lesions noted in the visualized portions of the skeleton. IMPRESSION: 1. Findings are most compatible with severe multilobar bronchopneumonia. 2. Moderate bilateral pleural effusions with areas of dependent atelectasis in the lower lobes of lungs bilaterally. 3. Cardiomegaly. 4. Aortic atherosclerosis, in addition to left main and 3 vessel coronary artery disease. 5.  Previously treated right upper lobe pulmonary nodule is now obscured by overlying airspace disease. Attention on followup studies is recommended. Aortic Atherosclerosis (ICD10-I70.0). Electronically Signed   By: Vinnie Langton M.D.   On: 09/11/2017 11:06   Dg Chest Port 1 View  Result Date: 09/12/2017 CLINICAL DATA:  Endotracheal and OG tube placements EXAM: PORTABLE CHEST 1 VIEW COMPARISON:  09/11/2017 FINDINGS: Interval placement of an endotracheal tube with tip measuring 4.5 cm above the carina. An enteric tube has been placed. The tip is off the field of view but is well below the left hemidiaphragm. Heart size is normal. Again, there is diffuse bilateral airspace disease with a mostly perihilar distribution. Probable left pleural effusion. No pneumothorax. Calcification of the aorta. IMPRESSION: Appliances appear in satisfactory position. Again, there is diffuse bilateral pulmonary infiltration with small left pleural effusion. Changes likely represent multifocal pneumonia, ARDS, or edema. Electronically Signed   By: Lucienne Capers M.D.   On: 09/12/2017 00:20   Dg Chest Port 1 View  Result Date: 09/11/2017 CLINICAL DATA:  83 year old female with history of fever for 1 day. EXAM: PORTABLE CHEST 1 VIEW COMPARISON:  Chest x-ray 09/07/2017. FINDINGS: Decreasing lung volumes with worsening aeration throughout the mid to lower lungs bilaterally, with air bronchograms evident in the right lower lobe, concerning for progressive multilobar pneumonia. Small right and moderate left pleural effusions. Pulmonary vasculature is obscured. No pneumothorax. Cardiomegaly. The patient is rotated to the left on today's exam, resulting in distortion of the mediastinal  contours and reduced diagnostic sensitivity and specificity for mediastinal pathology. Atherosclerosis in the thoracic aorta. IMPRESSION: 1. Findings are compatible with worsening severe multilobar pneumonia, most evident throughout the mid to lower  lungs bilaterally. 2. Small right and moderate left parapneumonic pleural effusions. 3. Cardiomegaly. 4. Aortic atherosclerosis. Electronically Signed   By: Vinnie Langton M.D.   On: 09/11/2017 08:09   Ct Head Code Stroke Wo Contrast  Result Date: 09/11/2017 CLINICAL DATA:  Code stroke.  Right-sided weakness. EXAM: CT HEAD WITHOUT CONTRAST TECHNIQUE: Contiguous axial images were obtained from the base of the skull through the vertex without intravenous contrast. COMPARISON:  09/06/2017 CT of the head. FINDINGS: Brain: No evidence of acute infarction, hemorrhage, hydrocephalus, extra-axial collection or mass lesion/mass effect. Right anterior temporal lobe chronic encephalomalacia is unchanged. Foci of hypoattenuation predominantly in periventricular white matter is stable and compatible with mild chronic microvascular ischemic changes. Mild diffuse brain parenchymal volume loss is stable. Vascular: Calcific atherosclerosis of carotid siphons. Skull: Normal. Negative for fracture or focal lesion. Sinuses/Orbits: No acute finding. Other: None. ASPECTS [facility] Stroke Program Early CT Score) - Ganglionic level infarction (caudate, lentiform nuclei, internal capsule, insula, M1-M3 cortex): 7 - Supraganglionic infarction (M4-M6 cortex): 3 Total score (0-10 with 10 being normal): 10 IMPRESSION: 1. No acute intracranial abnormality identified. 2. Stable right anterior temporal lobe encephalomalacia, chronic microvascular ischemic changes, and parenchymal volume loss of the brain. 3. ASPECTS is 10 These results were called by telephone at the time of interpretation on 09/11/2017 at 8:04 pm to Dr. Lorraine Lax who verbally acknowledged these results. Electronically Signed   By: Kristine Garbe M.D.   On: 09/11/2017 20:04     Assessment/Plan: Aspiration pneumonia = currently on day 6 of abtx. Await recent culture results to see if can de-escalate.   Leukocytosis = multifactorial- stress from CVA +/-  pneumonitis.anticipate to trend down as well.  Baxter Flattery [facility] for Infectious Diseases Cell: [phone] Pager: [phone]  09/12/2017, 8:00 PM

## 2017-09-12 NOTE — Progress Notes (Signed)
PULMONARY / CRITICAL CARE MEDICINE   Name: Kayla Kent MRN: [id number] DOB: 14-Aug-1932    ADMISSION DATE:  09/06/2017 CONSULTATION DATE:  09/11/2017  REFERRING MD:  Lesia Sago    CHIEF COMPLAINT:  Respiratory failure  HISTORY OF PRESENT ILLNESS:   83 year old female history of on apixaban, seizure disorder, documented COPD, right upper lobe lung cancer status post radiation therapy who initially presented 09/06/17 for confusion and hematemesis after being found down.  EGD was performed 11/23 which was only notable for patchy mild inflammation thoughout the stomach, respumed gastritis with stable hemoglobin and no further episodes of bleeding.  In addition she was being treated for an ESBL UTI based on urinalysis showing many bacteria and prior cultures showing ESBL E. coli.  Urine cultures here subsequently grew greater than 100,000 Klebsiella pneumoniae.  She was also noted to have multifocal infiltrates on her CT chest concerning for pneumonia.  Infectious disease was consulted on 11/25 and recommended changing vancomycin to linezolid for presumed aspiration causing her pneumonia throughout her admission her oxygen requirement has progressively increased to requiring a nonrebreather today to maintain oxygen saturation at 90% for which we were consulted.  Intubated last evening to undergo medical thrombectomy by interventional radiology for acute stroke.  Patient to be assessed today for possible extubation.  Care plan discussed with stroke neurology service.     PAST MEDICAL HISTORY :  She  has a past medical history of Allergic rhinitis, Anxiety, CKD (chronic kidney disease) stage 3, GFR 30-59 ml/min (HCC), Emphysema of lung (HCC), HLD (hyperlipidemia), HTN (hypertension), Intolerance of drug, Paroxysmal supraventricular tachycardia ([place]), PVD (peripheral vascular disease) ([place]), Seizure ([place]), Syncope and collapse, and Uterine prolapse without mention of vaginal wall prolapse.     PAST SURGICAL HISTORY: She  has a past surgical history that includes corrective eye surgery; Cholecystectomy; Tonsillectomy; IVD removed; stress cardiolite (08/05/93); RF ablation PSVT; Total hip arthroplasty (Right, 08/29/2014); Open reduction internal fixation (orif) distal radial fracture (Right, 08/29/2014); Cardioversion (N/A, 11/05/2014); cataract surgery (08/2015); Intramedullary (im) nail intertrochanteric (Left, 11/21/2016); and Esophagogastroduodenoscopy (N/A, 09/09/2017).  Allergies  Allergen Reactions  . Chocolate Hives  . Codeine Other (See Comments)    Patient states she acts crazy  . Diazepam Other (See Comments)    Patient states she acts crazy.  . Fruit & Vegetable Daily [Nutritional Supplements] Hives and Swelling    peaches  . Iohexol Hives     Code: HIVES, Desc: pt gets 13 hr pre-meds, Onset Date: [id number]   . Latex Hives  . Peach [Prunus Persica] Hives  . Peanut-Containing Drug Products Hives and Swelling  . Penicillins Hives, Itching and Swelling    Has patient had a PCN reaction causing immediate rash, facial/tongue/throat swelling, SOB or lightheadedness with hypotension: Yes Has patient had a PCN reaction causing severe rash involving mucus membranes or skin necrosis: No Has patient had a PCN reaction that required hospitalization No Has patient had a PCN reaction occurring within the last 10 years: No If all of the above answers are "NO", then may proceed with Cephalosporin use.   . Strawberry Extract Hives  . Sulfonamide Derivatives Hives    nausea  . Wheat Shortness Of Breath    Shortness of breath  . Wheat Bran Shortness Of Breath  . Sulfamethoxazole Hives  . Crestor [Rosuvastatin Calcium] Rash  . Iodine Rash    No current facility-administered medications on file prior to encounter.    Current Outpatient Medications on File Prior to Encounter  Medication Sig  .  ALPRAZolam (XANAX) 0.25 MG tablet TAKE 1 TABLET BY MOUTH TWICE DAILY AS NEEDED  .  amLODipine (NORVASC) 2.5 MG tablet TAKE ONE TABLET BY MOUTH ONCE DAILY  . atorvastatin (LIPITOR) 10 MG tablet TAKE ONE TABLET BY MOUTH ONCE DAILY AT 6 PM  . bismuth subsalicylate (PEPTO BISMOL) 262 MG/15ML suspension Take 30 mLs by mouth every 6 (six) hours as needed for indigestion.  Marland Kitchen DILT-XR 180 MG 24 hr capsule TAKE 1 CAPSULE BY MOUTH ONCE DAILY  . ELIQUIS 2.5 MG TABS tablet TAKE ONE TABLET BY MOUTH TWICE DAILY  . levETIRAcetam (KEPPRA) 250 MG tablet Take 1 tablet (250 mg total) by mouth 2 (two) times daily.  Marland Kitchen oxybutynin (DITROPAN XL) 10 MG 24 hr tablet Take 1 tablet (10 mg total) by mouth at bedtime.  . pantoprazole (PROTONIX) 40 MG tablet Take 1 tablet (40 mg total) by mouth daily.  . sodium chloride (OCEAN) 0.65 % SOLN nasal spray Place 1 spray into both nostrils as needed for congestion.  . traZODone (DESYREL) 50 MG tablet Take 50 mg by mouth at bedtime.   . ciprofloxacin (CIPRO) 250 MG tablet Take 1 tablet (250 mg total) by mouth 2 (two) times daily. (Patient not taking: Reported on 09/06/2017)  . ibuprofen (ADVIL,MOTRIN) 600 MG tablet Take 1 tablet (600 mg total) by mouth every 6 (six) hours as needed. (Patient not taking: Reported on 09/06/2017)  . methocarbamol (ROBAXIN) 500 MG tablet Take 1 tablet (500 mg total) by mouth at bedtime as needed for muscle spasms. (Patient not taking: Reported on 09/06/2017)  . traMADol (ULTRAM) 50 MG tablet Take 1-2 tablets (50-100 mg total) by mouth every 6 (six) hours as needed for moderate pain or severe pain. (Patient not taking: Reported on 09/06/2017)    VITAL SIGNS: BP (!) 124/45   Pulse 89   Temp 99.5 F (37.5 C) (Axillary)   Resp (!) 27   Ht 5\' 7"  (1.702 m)   Wt 49.8 kg (109 lb 12.6 oz)   SpO2 95%   BMI 17.20 kg/m   HEMODYNAMICS:    VENTILATOR SETTINGS: Vent Mode: PRVC FiO2 (%):  [50 %-100 %] 60 % Set Rate:  [15 bmp] 15 bmp Vt Set:  [400 mL-500 mL] 500 mL PEEP:  [5 cmH20] 5 cmH20 Plateau Pressure:  [14 cmH20] 14  cmH20  INTAKE / OUTPUT: I/O last 3 completed shifts: In: 2941.2 [P.O.:220; I.V.:1718.7; Other:200; IV Piggyback:802.5] Out: 2125 [Urine:1925; Blood:200]  PHYSICAL EXAMINATION: General: On propofol at 10 mcg/kg/min.  RA SS of -1. Neuro: Opens eyes to voice and tactile stimulation.  Right upper extremity without motion.  Right lower extremity with some movement to tactile stimulation.  Both left upper and left lower extremity movement spontaneously.  Pupils equally reactive to light.  Positive gag reflex. HEENT: ETT, OGT.  No icterus. Cardiovascular: S1, S2.  Irregularly irregular rhythm.  Rate approximately 95 bpm.  No S3 gallop, murmur, or rub. Lungs: Diminished breath sounds at the bases.  Scattered rhonchi.  No wheezes.  No obvious crackles. Abdomen: Flat, nontender.  No muscle guarding.  No organomegaly. Musculoskeletal: No pitting edema, clubbing, cyanosis. Skin: No ecchymoses.  LABS:  BMET Recent Labs  Lab 09/10/17 0308 09/11/17 0549 09/12/17 0549  NA 137 135 136  K 3.9 4.0 4.3  CL 108 104 106  CO2 23 22 22   BUN 19 20 24*  CREATININE 0.84 0.85 0.75  GLUCOSE 82 119* 147*    Electrolytes Recent Labs  Lab 09/07/17 1620  09/08/17 0716  09/10/17 0308 09/11/17 0549 09/12/17 0549  CALCIUM 8.0*   < >  --    < > 7.5* 7.5* 7.3*  MG 1.7  --  2.0  --   --   --  1.8  PHOS  --   --   --   --   --   --  4.0   < > = values in this interval not displayed.    CBC Recent Labs  Lab 09/10/17 0308 09/11/17 0549 09/12/17 0549  WBC 8.9 18.6* 17.3*  HGB 10.9* 12.5 10.0*  HCT 32.7* 36.7 29.5*  PLT 184 197 208    Coag's Recent Labs  Lab 09/11/17 2010  APTT 36  INR 1.22    Sepsis Markers Recent Labs  Lab 09/07/17 0347 09/11/17 0831 09/11/17 1119 09/12/17 0549  LATICACIDVEN 1.7 1.4 2.4*  --   PROCALCITON  --  2.17  --  1.90    ABG Recent Labs  Lab 09/11/17 1255 09/11/17 2359  PHART 7.529* 7.350  PCO2ART 31.7* 44.0  PO2ART 58.7* 79.2*    Liver  Enzymes Recent Labs  Lab 09/06/17 1102 09/07/17 0347 09/08/17 0336  AST 66* 110* 109*  ALT 34 48 58*  ALKPHOS 105 86 109  BILITOT 1.2 1.4* 1.2  ALBUMIN 2.6* 2.2* 2.2*    Cardiac Enzymes No results for input(s): TROPONINI, PROBNP in the last 168 hours.  Glucose No results for input(s): GLUCAP in the last 168 hours.  Imaging Ct Head Wo Contrast  Result Date: 09/11/2017 CLINICAL DATA:  Status post stroke intervention. EXAM: CT HEAD WITHOUT CONTRAST TECHNIQUE: Contiguous axial images were obtained from the base of the skull through the vertex without intravenous contrast. COMPARISON:  Head CT 09/11/2017 Cervical angiogram 09/11/2017 FINDINGS: Brain: There is a small amount of petechial hemorrhage versus contrast staining of the medial aspect of the left frontal operculum, coronal image 38. There is no intraparenchymal hematoma. No mass effect. No extra-axial collection. Anterior right temporal lobe encephalomalacia is unchanged. There is periventricular hypoattenuation compatible with chronic microvascular disease. No hydrocephalus. Vascular: Intravascular hyperattenuation is likely persisting contrast material. Skull: Normal visualized skull base, calvarium and extracranial soft tissues. Sinuses/Orbits: No sinus fluid levels or advanced mucosal thickening. No mastoid effusion. Normal orbits. IMPRESSION: 1. Small focus of contrast staining versus petechial hemorrhage at the medial aspect of the left frontal operculum. 2. No intraparenchymal hematoma, mass effect or hydrocephalus. Electronically Signed   By: Ulyses Jarred M.D.   On: 09/11/2017 23:42   Dg Chest Port 1 View  Result Date: 09/12/2017 CLINICAL DATA:  Endotracheal and OG tube placements EXAM: PORTABLE CHEST 1 VIEW COMPARISON:  09/11/2017 FINDINGS: Interval placement of an endotracheal tube with tip measuring 4.5 cm above the carina. An enteric tube has been placed. The tip is off the field of view but is well below the left  hemidiaphragm. Heart size is normal. Again, there is diffuse bilateral airspace disease with a mostly perihilar distribution. Probable left pleural effusion. No pneumothorax. Calcification of the aorta. IMPRESSION: Appliances appear in satisfactory position. Again, there is diffuse bilateral pulmonary infiltration with small left pleural effusion. Changes likely represent multifocal pneumonia, ARDS, or edema. Electronically Signed   By: Lucienne Capers M.D.   On: 09/12/2017 00:20   Ct Head Code Stroke Wo Contrast  Result Date: 09/11/2017 CLINICAL DATA:  Code stroke.  Right-sided weakness. EXAM: CT HEAD WITHOUT CONTRAST TECHNIQUE: Contiguous axial images were obtained from the base of the skull through the vertex without intravenous contrast. COMPARISON:  09/06/2017 CT of the head. FINDINGS: Brain: No evidence of acute infarction, hemorrhage, hydrocephalus, extra-axial collection or mass lesion/mass effect. Right anterior temporal lobe chronic encephalomalacia is unchanged. Foci of hypoattenuation predominantly in periventricular white matter is stable and compatible with mild chronic microvascular ischemic changes. Mild diffuse brain parenchymal volume loss is stable. Vascular: Calcific atherosclerosis of carotid siphons. Skull: Normal. Negative for fracture or focal lesion. Sinuses/Orbits: No acute finding. Other: None. ASPECTS [facility] Stroke Program Early CT Score) - Ganglionic level infarction (caudate, lentiform nuclei, internal capsule, insula, M1-M3 cortex): 7 - Supraganglionic infarction (M4-M6 cortex): 3 Total score (0-10 with 10 being normal): 10 IMPRESSION: 1. No acute intracranial abnormality identified. 2. Stable right anterior temporal lobe encephalomalacia, chronic microvascular ischemic changes, and parenchymal volume loss of the brain. 3. ASPECTS is 10 These results were called by telephone at the time of interpretation on 09/11/2017 at 8:04 pm to Dr. Lorraine Lax who verbally acknowledged these  results. Electronically Signed   By: Kristine Garbe M.D.   On: 09/11/2017 20:04   Kayla Kent  ECHO COMPLETE WO IMAGING ENHANCING AGENT  Order# [id number]  Reading physician: Josue Hector, MD Ordering physician: Rise Patience, MD Study date: 04/08/17  Study Result   Result status: Final result                              *[facility]*                         [address], [place] [address]                            [phone]  ------------------------------------------------------------------- Transthoracic Echocardiography  Patient:    Kayla, Kent MR #:       [id number] Study Date: 04/08/2017 Gender:     F Age:        8 Height:     170.2 cm Weight:     56.7 kg BSA:        1.63 m^2 Pt. Status: Room:       3E06C   ADMITTING    Linward Foster  REFERRING    Rise Patience  PERFORMING   [place], Inpatient  SONOGRAPHER  Mikki Santee  ATTENDING    Fatima Blank  cc:  ------------------------------------------------------------------- LV EF: 55% -   60%  ------------------------------------------------------------------- History:   PMH:  Elevated Troponin.  Risk factors:  Hypertension. Dyslipidemia.  ------------------------------------------------------------------- Study Conclusions  - Left ventricle: The cavity size was normal. Wall thickness was   normal. Systolic function was normal. The estimated ejection   fraction was in the range of 55% to 60%. Doppler parameters are   consistent with both elevated ventricular end-diastolic filling   pressure and elevated left atrial filling pressure. - Aortic valve: There was trivial regurgitation. - Mitral valve: There was mild regurgitation. Valve area by   continuity equation (using LVOT flow): 1.22 cm^2. - Right atrium: The atrium was mildly  dilated. - Atrial septum: There was a patent foramen ovale. - Pulmonary arteries:  PA peak pressure: 49 mm Hg (S). - Pericardium, extracardiac: A trivial pericardial effusion was   identified.  ------------------------------------------------------------------- Study data:  Comparison was made to the study of 09/23/2014.  Study status:  Routine.  Procedure:  The patient reported no pain pre or post test. Transthoracic echocardiography. Image quality was adequate.  Study completion:  There were no complications. Transthoracic echocardiography.  M-mode, complete 2D, spectral Doppler, and color Doppler.  Birthdate:  Patient birthdate: 1932-08-23.  Age:  Patient is 83 yr old.  Sex:  Gender: female. BMI: 19.6 kg/m^2.  Blood pressure:     110/50  Patient status: Inpatient.  Study date:  Study date: 04/08/2017. Study time: 06:27 PM.  Location:  Bedside.  -------------------------------------------------------------------  ------------------------------------------------------------------- Left ventricle:  The cavity size was normal. Wall thickness was normal. Systolic function was normal. The estimated ejection fraction was in the range of 55% to 60%. Doppler parameters are consistent with both elevated ventricular end-diastolic filling pressure and elevated left atrial filling pressure.  ------------------------------------------------------------------- Aortic valve:   Trileaflet; mildly thickened, mildly calcified leaflets.  Doppler:   There was no stenosis.   There was trivial regurgitation.  ------------------------------------------------------------------- Aorta:  The aorta was normal, not dilated, and non-diseased.  ------------------------------------------------------------------- Mitral valve:   Mildly thickened leaflets .  Doppler:  There was mild regurgitation.    Valve area by continuity equation (using LVOT flow): 1.22 cm^2. Indexed valve area by continuity  equation (using LVOT flow): 0.75 cm^2/m^2.    Mean gradient (D): 3 mm Hg.   ------------------------------------------------------------------- Left atrium:  The atrium was normal in size.  ------------------------------------------------------------------- Atrial septum:  There was a patent foramen ovale.  ------------------------------------------------------------------- Right ventricle:  The cavity size was normal. Wall thickness was normal. Systolic function was normal.  ------------------------------------------------------------------- Pulmonic valve:    Doppler:  There was mild regurgitation.  ------------------------------------------------------------------- Tricuspid valve:   Doppler:  There was mild regurgitation.  ------------------------------------------------------------------- Right atrium:  The atrium was mildly dilated.  ------------------------------------------------------------------- Pericardium:  A trivial pericardial effusion was identified.  ------------------------------------------------------------------- Systemic veins: Inferior vena cava: The vessel was dilated. The respirophasic diameter changes were blunted (< 50%), consistent with elevated central venous pressure.  ------------------------------------------------------------------- Post procedure conclusions Ascending Aorta:  - The aorta was normal, not dilated, and non-diseased.  ------------------------------------------------------------------- Measurements   Left ventricle                           Value          Reference  LV ID, ED, PLAX chordal          (L)     41    mm       43 - 52  LV ID, ES, PLAX chordal                  28    mm       23 - 38  LV fx shortening, PLAX chordal           32    %        >=29  LV PW thickness, ED                      7     mm       ----------  IVS/LV PW ratio, ED                      1.14           <=  1.3  Stroke volume, 2D                         41    ml       ----------  Stroke volume/bsa, 2D                    25    ml/m^2   ----------  LV e&', lateral                           8.92  cm/s     ----------  LV e&', medial                            10.2  cm/s     ----------  LV e&', average                           9.56  cm/s     ----------    Ventricular septum                       Value          Reference  IVS thickness, ED                        8     mm       ----------    LVOT                                     Value          Reference  LVOT ID, S                               16    mm       ----------  LVOT area                                2.01  cm^2     ----------  LVOT peak velocity, S                    103   cm/s     ----------  LVOT mean velocity, S                    66.8  cm/s     ----------  LVOT VTI, S                              20.2  cm       ----------  LVOT peak gradient, S                    4     mm Hg    ----------    Aortic valve                             Value          Reference  Aortic regurg pressure half-time  872   ms       ----------    Aorta                                    Value          Reference  Aortic root ID, ED                       28    mm       ----------    Left atrium                              Value          Reference  LA ID, A-P, ES                           35    mm       ----------  LA ID/bsa, A-P                           2.15  cm/m^2   <=2.2  LA volume, S                             75.3  ml       ----------  LA volume/bsa, S                         46.2  ml/m^2   ----------  LA volume, ES, 1-p A4C                   65.9  ml       ----------  LA volume/bsa, ES, 1-p A4C               40.4  ml/m^2   ----------  LA volume, ES, 1-p A2C                   82.4  ml       ----------  LA volume/bsa, ES, 1-p A2C               50.5  ml/m^2   ----------    Mitral valve                             Value          Reference  Mitral mean velocity, D                   70.6  cm/s     ----------  Mitral deceleration time                 208   ms       150 - 230  Mitral mean gradient, D                  3     mm Hg    ----------  Mitral E/A ratio, peak                   4.6            ----------  Mitral valve  area, LVOT                  1.22  cm^2     ----------  continuity  Mitral valve area/bsa, LVOT              0.75  cm^2/m^2 ----------  continuity  Mitral annulus VTI, D                    33.2  cm       ----------    Pulmonary arteries                       Value          Reference  PA pressure, S, DP               (H)     49    mm Hg    <=30    Tricuspid valve                          Value          Reference  Tricuspid regurg peak velocity           321   cm/s     ----------  Tricuspid peak RV-RA gradient            41    mm Hg    ----------    Right atrium                             Value          Reference  RA ID, S-I, ES, A4C              (H)     60.1  mm       34 - 49  RA area, ES, A4C                 (H)     23.6  cm^2     8.3 - 19.5  RA volume, ES, A/L                       74.1  ml       ----------  RA volume/bsa, ES, A/L                   45.5  ml/m^2   ----------    Systemic veins                           Value          Reference  Estimated CVP                            8     mm Hg    ----------    Right ventricle                          Value          Reference  TAPSE                                    12.6  mm       ----------  RV pressure, S, DP               (H)     49    mm Hg    <=30  RV s&', lateral, S                        9.36  cm/s     ----------  Legend: (L)  and  (H)  mark values outside specified reference range.  ------------------------------------------------------------------- Prepared and Electronically Authenticated by  Jenkins Rouge, M.D. 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             CULTURES: Respiratory viral panel negative Urine culture: >100k klebsiella 09/11/17 blood culture pending MRSA screen negative    ANTIBIOTICS: cipro 11/20-11/21 Meropenem 11/21>>> Vancomycin 11/22-11/24 linezolid 11/25>>   SIGNIFICANT EVENTS: Status post thrombectomy of the left MCA.  LINES/TUBES: Peripherals  DISCUSSION: 83 year old female developed a aphasia and right hemiplegia yesterday requiring medical thrombectomy.  Patient intubated last night.  ASSESSMENT / PLAN:  PULMONARY A: 1.  Acute respiratory failure with hypoxemia. 2.  COPD, undocumented 3.  Pleural effusions P:   1. Assess mental status and consider spontaneous breathing trial with goal of extubation.  There is advised to wean sedation as tolerated. 2.  We will consider spontaneous breathing trial once the FiO2 is less than or equal to 50%.  PEEP is at 5 cm. 3.  As needed bronchodilators.  This will help minimize the adverse cardiovascular effects of albuterol.  COPD not documented at this time.   CARDIOVASCULAR A:  Atrial fibrillation. Probable diastolic congestive heart failure P:  1.  Being evaluated by cardiology.  Patient not a current candidate for anticoagulation. 2.  Nurse to consult with interventional radiology regarding blood pressure goals.  Currently off nicardipine.  Cardiology recommends switch to diltiazem. 3.  Maintain a net negative fluid balance of -1 L/day.  Continue diuresis. 4.  Follow-up  chest x-ray tomorrow.  RENAL A: No issues.  P:   No current plan.  GASTROINTESTINAL A: Gastritis, status post EGD  P: 1.  Continue Protonix.  Continue to monitor the hemoglobin.  HEMATOLOGIC A: No active issues   INFECTIOUS A:   Aspiration pneumonia  P:   -Continue empiric linezolid and meropenem per infectious disease recommendations.  Follow-up on blood cultures, urine strep.  Sputum cultures ordered.  Narrow antibiotics based on results.   ENDOCRINE A: No active issues  NEUROLOGIC A: Acute stroke  P:   RASS goal: 0-1 1.  Status post medical thrombectomy. 2.  Not a candidate for anticoagulation given recent history of GI bleeding.  FAMILY  - Updates: No family at the bedside  - Inter-disciplinary family meet or Palliative Care meeting due by: day 7   Salley Scarlet, M.D. Pulmonary and [place] Pager: [phone]  Critical care time spent independently equals 45 minutes.  09/12/2017, 11:37 AM

## 2017-09-12 NOTE — Telephone Encounter (Signed)
Noted.  Dr. Jenny Reichmann, [facility] [place].

## 2017-09-12 NOTE — Anesthesia Postprocedure Evaluation (Signed)
Anesthesia Post Note  Patient: Kayla Kent  Procedure(s) Performed: ESOPHAGOGASTRODUODENOSCOPY (EGD) (N/A )     Patient location during evaluation: PACU Anesthesia Type: MAC Level of consciousness: awake and alert Pain management: pain level controlled Vital Signs Assessment: post-procedure vital signs reviewed and stable Respiratory status: spontaneous breathing, nonlabored ventilation, respiratory function stable and patient connected to nasal cannula oxygen Cardiovascular status: stable and blood pressure returned to baseline Postop Assessment: no apparent nausea or vomiting Anesthetic complications: no    Last Vitals:  Vitals:   09/12/17 1830 09/12/17 1900  BP: (!) 104/49 (!) 111/50  Pulse: 84 91  Resp: 16 16  Temp:    SpO2: 99% 99%    Last Pain:  Vitals:   09/12/17 1200  TempSrc: Axillary  PainSc:    Pain Goal:                 [name]

## 2017-09-12 NOTE — Progress Notes (Signed)
Referring Physician(s): Code Stroke  Supervising Physician: Luanne Bras  Patient Status:  [facility] - In-pt  Chief Complaint: Code Stroke  HPI:  83 Yr old rt handed female with acute ion set of aphasia and rt sided weakness.  S/P Lt common carotid arteriogram,followed by complete revascularization of occluded lt MCA distal inferior division with x 2 passes with solitaire 79mm x 40 mm FR retrieval device, and 4.5 mg of IA INTEGRELIN IA achieving a TICI 3 reperfusion  She remains intubated on vent. She is sedated with propofol.  Family in room.   Allergies: Chocolate; Codeine; Diazepam; Fruit & vegetable daily [nutritional supplements]; Iohexol; Latex; Peach [prunus persica]; Peanut-containing drug products; Penicillins; Strawberry extract; Sulfonamide derivatives; Wheat; Wheat bran; Sulfamethoxazole; Crestor [rosuvastatin calcium]; and Iodine  Medications: Prior to Admission medications   Medication Sig Start Date End Date Taking? Authorizing Provider  ALPRAZolam Duanne Moron) 0.25 MG tablet TAKE 1 TABLET BY MOUTH TWICE DAILY AS NEEDED 07/12/17  Yes Biagio Borg, MD  amLODipine (NORVASC) 2.5 MG tablet TAKE ONE TABLET BY MOUTH ONCE DAILY 04/01/17  Yes Fay Records, MD  atorvastatin (LIPITOR) 10 MG tablet TAKE ONE TABLET BY MOUTH ONCE DAILY AT 6 PM 06/27/17  Yes Bhagat, Bhavinkumar, PA  bismuth subsalicylate (PEPTO BISMOL) 262 MG/15ML suspension Take 30 mLs by mouth every 6 (six) hours as needed for indigestion.   Yes [provider]  DILT-XR 180 MG 24 hr capsule TAKE 1 CAPSULE BY MOUTH ONCE DAILY 06/10/17  Yes Biagio Borg, MD  ELIQUIS 2.5 MG TABS tablet TAKE ONE TABLET BY MOUTH TWICE DAILY 10/08/16  Yes Fay Records, MD  levETIRAcetam (KEPPRA) 250 MG tablet Take 1 tablet (250 mg total) by mouth 2 (two) times daily. 05/09/17  Yes Dohmeier, Asencion Partridge, MD  oxybutynin (DITROPAN XL) 10 MG 24 hr tablet Take 1 tablet (10 mg total) by mouth at bedtime. 07/28/17  Yes Biagio Borg, MD    pantoprazole (PROTONIX) 40 MG tablet Take 1 tablet (40 mg total) by mouth daily. 06/28/17  Yes Biagio Borg, MD  sodium chloride (OCEAN) 0.65 % SOLN nasal spray Place 1 spray into both nostrils as needed for congestion.   Yes [provider]  traZODone (DESYREL) 50 MG tablet Take 50 mg by mouth at bedtime.  06/07/17  Yes [provider]  ciprofloxacin (CIPRO) 250 MG tablet Take 1 tablet (250 mg total) by mouth 2 (two) times daily. Patient not taking: Reported on 09/06/2017 07/12/17   Nche, Charlene Brooke, NP  ibuprofen (ADVIL,MOTRIN) 600 MG tablet Take 1 tablet (600 mg total) by mouth every 6 (six) hours as needed. Patient not taking: Reported on 09/06/2017 06/27/17   Fatima Blank, MD  methocarbamol (ROBAXIN) 500 MG tablet Take 1 tablet (500 mg total) by mouth at bedtime as needed for muscle spasms. Patient not taking: Reported on 09/06/2017 07/18/17   Biagio Borg, MD  traMADol (ULTRAM) 50 MG tablet Take 1-2 tablets (50-100 mg total) by mouth every 6 (six) hours as needed for moderate pain or severe pain. Patient not taking: Reported on 09/06/2017 11/25/16   Rama, Venetia Maxon, MD     Vital Signs: BP 106/64   Pulse 82   Temp 97.7 F (36.5 C) (Axillary)   Resp 20   Ht 5\' 7"  (1.702 m)   Wt 109 lb 12.6 oz (49.8 kg)   SpO2 95%   BMI 17.20 kg/m   Physical Exam Intubated/vent, Sedated with propofol but does follow some simple commands.  Still unable to move right arm/hand Can move right foot some, still weak. Appears to have some right facial droop Left gaze deviation. Right CFA stick site ok.   Imaging: Ct Head Wo Contrast  Result Date: 09/11/2017 CLINICAL DATA:  Status post stroke intervention. EXAM: CT HEAD WITHOUT CONTRAST TECHNIQUE: Contiguous axial images were obtained from the base of the skull through the vertex without intravenous contrast. COMPARISON:  Head CT 09/11/2017 Cervical angiogram 09/11/2017 FINDINGS: Brain: There is a small amount of  petechial hemorrhage versus contrast staining of the medial aspect of the left frontal operculum, coronal image 38. There is no intraparenchymal hematoma. No mass effect. No extra-axial collection. Anterior right temporal lobe encephalomalacia is unchanged. There is periventricular hypoattenuation compatible with chronic microvascular disease. No hydrocephalus. Vascular: Intravascular hyperattenuation is likely persisting contrast material. Skull: Normal visualized skull base, calvarium and extracranial soft tissues. Sinuses/Orbits: No sinus fluid levels or advanced mucosal thickening. No mastoid effusion. Normal orbits. IMPRESSION: 1. Small focus of contrast staining versus petechial hemorrhage at the medial aspect of the left frontal operculum. 2. No intraparenchymal hematoma, mass effect or hydrocephalus. Electronically Signed   By: Ulyses Jarred M.D.   On: 09/11/2017 23:42   Ct Chest Wo Contrast  Result Date: 09/11/2017 CLINICAL DATA:  83 year old female with history of shortness of breath. EXAM: CT CHEST WITHOUT CONTRAST TECHNIQUE: Multidetector CT imaging of the chest was performed following the standard protocol without IV contrast. COMPARISON:  Chest CT 08/23/2017. FINDINGS: Cardiovascular: Heart size is mildly enlarged. There is no significant pericardial fluid, thickening or pericardial calcification. There is aortic atherosclerosis, as well as atherosclerosis of the great vessels of the mediastinum and the coronary arteries, including calcified atherosclerotic plaque in the left main, left anterior descending, left circumflex and right coronary arteries. Mediastinum/Nodes: No pathologically enlarged mediastinal or hilar lymph nodes. Please note that accurate exclusion of hilar adenopathy is limited on noncontrast CT scans. Esophagus is unremarkable in appearance. No axillary lymphadenopathy. Lungs/Pleura: Multifocal asymmetrically distributed airspace consolidation throughout the lungs bilaterally,  concerning for multilobar pneumonia. Previously noted right upper lobe pulmonary nodule is now largely obscured by adjacent airspace disease and is not readily identifiable. Moderate bilateral pleural effusions. Near complete atelectasis of the left lower lobe with some additional dependent atelectasis in the right lower lobe. Upper Abdomen: Aortic atherosclerosis. Musculoskeletal: There are no aggressive appearing lytic or blastic lesions noted in the visualized portions of the skeleton. IMPRESSION: 1. Findings are most compatible with severe multilobar bronchopneumonia. 2. Moderate bilateral pleural effusions with areas of dependent atelectasis in the lower lobes of lungs bilaterally. 3. Cardiomegaly. 4. Aortic atherosclerosis, in addition to left main and 3 vessel coronary artery disease. 5. Previously treated right upper lobe pulmonary nodule is now obscured by overlying airspace disease. Attention on followup studies is recommended. Aortic Atherosclerosis (ICD10-I70.0). Electronically Signed   By: Vinnie Langton M.D.   On: 09/11/2017 11:06   Dg Chest Port 1 View  Result Date: 09/12/2017 CLINICAL DATA:  Endotracheal and OG tube placements EXAM: PORTABLE CHEST 1 VIEW COMPARISON:  09/11/2017 FINDINGS: Interval placement of an endotracheal tube with tip measuring 4.5 cm above the carina. An enteric tube has been placed. The tip is off the field of view but is well below the left hemidiaphragm. Heart size is normal. Again, there is diffuse bilateral airspace disease with a mostly perihilar distribution. Probable left pleural effusion. No pneumothorax. Calcification of the aorta. IMPRESSION: Appliances appear in satisfactory position. Again, there is diffuse bilateral pulmonary infiltration with small  left pleural effusion. Changes likely represent multifocal pneumonia, ARDS, or edema. Electronically Signed   By: Lucienne Capers M.D.   On: 09/12/2017 00:20   Dg Chest Port 1 View  Result Date:  09/11/2017 CLINICAL DATA:  83 year old female with history of fever for 1 day. EXAM: PORTABLE CHEST 1 VIEW COMPARISON:  Chest x-ray 09/07/2017. FINDINGS: Decreasing lung volumes with worsening aeration throughout the mid to lower lungs bilaterally, with air bronchograms evident in the right lower lobe, concerning for progressive multilobar pneumonia. Small right and moderate left pleural effusions. Pulmonary vasculature is obscured. No pneumothorax. Cardiomegaly. The patient is rotated to the left on today's exam, resulting in distortion of the mediastinal contours and reduced diagnostic sensitivity and specificity for mediastinal pathology. Atherosclerosis in the thoracic aorta. IMPRESSION: 1. Findings are compatible with worsening severe multilobar pneumonia, most evident throughout the mid to lower lungs bilaterally. 2. Small right and moderate left parapneumonic pleural effusions. 3. Cardiomegaly. 4. Aortic atherosclerosis. Electronically Signed   By: Vinnie Langton M.D.   On: 09/11/2017 08:09   Ct Head Code Stroke Wo Contrast  Result Date: 09/11/2017 CLINICAL DATA:  Code stroke.  Right-sided weakness. EXAM: CT HEAD WITHOUT CONTRAST TECHNIQUE: Contiguous axial images were obtained from the base of the skull through the vertex without intravenous contrast. COMPARISON:  09/06/2017 CT of the head. FINDINGS: Brain: No evidence of acute infarction, hemorrhage, hydrocephalus, extra-axial collection or mass lesion/mass effect. Right anterior temporal lobe chronic encephalomalacia is unchanged. Foci of hypoattenuation predominantly in periventricular white matter is stable and compatible with mild chronic microvascular ischemic changes. Mild diffuse brain parenchymal volume loss is stable. Vascular: Calcific atherosclerosis of carotid siphons. Skull: Normal. Negative for fracture or focal lesion. Sinuses/Orbits: No acute finding. Other: None. ASPECTS [facility] Stroke Program Early CT Score) - Ganglionic level  infarction (caudate, lentiform nuclei, internal capsule, insula, M1-M3 cortex): 7 - Supraganglionic infarction (M4-M6 cortex): 3 Total score (0-10 with 10 being normal): 10 IMPRESSION: 1. No acute intracranial abnormality identified. 2. Stable right anterior temporal lobe encephalomalacia, chronic microvascular ischemic changes, and parenchymal volume loss of the brain. 3. ASPECTS is 10 These results were called by telephone at the time of interpretation on 09/11/2017 at 8:04 pm to Dr. Lorraine Lax who verbally acknowledged these results. Electronically Signed   By: Kristine Garbe M.D.   On: 09/11/2017 20:04    Labs:  CBC: Recent Labs    09/09/17 0339 09/10/17 0308 09/11/17 0549 09/12/17 0549  WBC 15.6* 8.9 18.6* 17.3*  HGB 11.1* 10.9* 12.5 10.0*  HCT 33.2* 32.7* 36.7 29.5*  PLT 172 184 197 208    COAGS: Recent Labs    11/27/16 1024 04/06/17 1906 04/06/17 2212 09/11/17 2010  INR 1.34 1.51 1.59 1.22  APTT  --  34 38* 36    BMP: Recent Labs    09/09/17 0339 09/10/17 0308 09/11/17 0549 09/12/17 0549  NA 139 137 135 136  K 3.8 3.9 4.0 4.3  CL 107 108 104 106  CO2 25 23 22 22   GLUCOSE 94 82 119* 147*  BUN 16 19 20  24*  CALCIUM 7.6* 7.5* 7.5* 7.3*  CREATININE 0.83 0.84 0.85 0.75  GFRNONAA >60 >60 >60 >60  GFRAA >60 >60 >60 >60    LIVER FUNCTION TESTS: Recent Labs    04/13/17 0839 09/06/17 1102 09/07/17 0347 09/08/17 0336  BILITOT 0.7 1.2 1.4* 1.2  AST 16 66* 110* 109*  ALT 26 34 48 58*  ALKPHOS 118* 105 86 109  PROT 6.8 5.1* 4.7* 5.3*  ALBUMIN 3.6 2.6* 2.2* 2.2*    Assessment and Plan:  Left CVA  S/P Lt common carotid arteriogram,followed by complete revascularization of occluded lt MCA distal inferior division with x 2 passes with solitaire 71mm x 40 mm FR retrieval device, and 4.5 mg of IA INTEGRELIN IA achieving a TICI 3 reperfusion  Continue to keep SBP around 140 per Dr. Estanislado Pandy.  Electronically Signed: Murrell Redden, PA-C 09/12/2017, 3:29  PM   I spent a total of 15 Minutes at the the patient's bedside AND on the patient's hospital floor or unit, greater than 50% of which was counseling/coordinating care for f/u stroke/cerebral intervention

## 2017-09-12 NOTE — Anesthesia Postprocedure Evaluation (Signed)
Anesthesia Post Note  Patient: Kayla Kent  Procedure(s) Performed: RADIOLOGY WITH ANESTHESIA (N/A )     Patient location during evaluation: ICU Anesthesia Type: General Level of consciousness: sedated Pain management: pain level controlled Vital Signs Assessment: post-procedure vital signs reviewed and stable Respiratory status: patient remains intubated per anesthesia plan Cardiovascular status: stable Postop Assessment: no apparent nausea or vomiting Anesthetic complications: no    Last Vitals:  Vitals:   09/12/17 0400 09/12/17 0430  BP: (!) 95/43 (!) 104/39  Pulse: (!) 51 (!) 52  Resp: 19 (!) 22  Temp:    SpO2: 96% 96%    Last Pain:  Vitals:   09/11/17 1700  TempSrc: Oral  PainSc:                  Maris Bena,W. EDMOND

## 2017-09-13 ENCOUNTER — Inpatient Hospital Stay (HOSPITAL_COMMUNITY): Payer: Medicare Other

## 2017-09-13 ENCOUNTER — Encounter (HOSPITAL_COMMUNITY): Payer: Self-pay

## 2017-09-13 ENCOUNTER — Ambulatory Visit: Payer: Medicare Other | Admitting: Internal Medicine

## 2017-09-13 DIAGNOSIS — D72829 Elevated white blood cell count, unspecified: Secondary | ICD-10-CM

## 2017-09-13 DIAGNOSIS — I6330 Cerebral infarction due to thrombosis of unspecified cerebral artery: Secondary | ICD-10-CM

## 2017-09-13 DIAGNOSIS — R509 Fever, unspecified: Secondary | ICD-10-CM

## 2017-09-13 DIAGNOSIS — J690 Pneumonitis due to inhalation of food and vomit: Secondary | ICD-10-CM

## 2017-09-13 LAB — BLOOD GAS, ARTERIAL
Acid-Base Excess: 2.7 mmol/L — ABNORMAL HIGH (ref 0.0–2.0)
Bicarbonate: 25.9 mmol/L (ref 20.0–28.0)
Drawn by: 27027
FIO2: 50
MECHVT: 500 mL
O2 Saturation: 98.4 %
PEEP: 5 cmH2O
Patient temperature: 98.6
RATE: 15 resp/min
pCO2 arterial: 33.9 mmHg (ref 32.0–48.0)
pH, Arterial: 7.495 — ABNORMAL HIGH (ref 7.350–7.450)
pO2, Arterial: 117 mmHg — ABNORMAL HIGH (ref 83.0–108.0)

## 2017-09-13 LAB — BASIC METABOLIC PANEL
ANION GAP: 7 (ref 5–15)
BUN: 29 mg/dL — ABNORMAL HIGH (ref 6–20)
CALCIUM: 7.7 mg/dL — AB (ref 8.9–10.3)
CHLORIDE: 108 mmol/L (ref 101–111)
CO2: 25 mmol/L (ref 22–32)
CREATININE: 0.8 mg/dL (ref 0.44–1.00)
GFR calc non Af Amer: >60 mL/min (ref >60)
Glucose, Bld: 80 mg/dL (ref 65–99)
Potassium: 3.7 mmol/L (ref 3.5–5.1)
SODIUM: 140 mmol/L (ref 135–145)

## 2017-09-13 LAB — CBC WITH DIFFERENTIAL/PLATELET
BASOS ABS: 0 10*3/uL (ref 0.0–0.1)
BASOS PCT: 0 %
EOS ABS: 0 10*3/uL (ref 0.0–0.7)
Eosinophils Relative: 0 %
HEMATOCRIT: 27.1 % — AB (ref 36.0–46.0)
HEMOGLOBIN: 9 g/dL — AB (ref 12.0–15.0)
Lymphocytes Relative: 13 %
Lymphs Abs: 1.4 10*3/uL (ref 0.7–4.0)
MCH: 30.3 pg (ref 26.0–34.0)
MCHC: 33.2 g/dL (ref 30.0–36.0)
MCV: 91.2 fL (ref 78.0–100.0)
MONOS PCT: 8 %
Monocytes Absolute: 0.8 10*3/uL (ref 0.1–1.0)
NEUTROS ABS: 8.5 10*3/uL — AB (ref 1.7–7.7)
NEUTROS PCT: 79 %
Platelets: 242 10*3/uL (ref 150–400)
RBC: 2.97 MIL/uL — ABNORMAL LOW (ref 3.87–5.11)
RDW: 15.6 % — AB (ref 11.5–15.5)
WBC: 10.8 10*3/uL — ABNORMAL HIGH (ref 4.0–10.5)

## 2017-09-13 LAB — PROTIME-INR
INR: 1.17
Prothrombin Time: 14.8 seconds (ref 11.4–15.2)

## 2017-09-13 LAB — LEGIONELLA PNEUMOPHILA SEROGP 1 UR AG: L. pneumophila Serogp 1 Ur Ag: NEGATIVE

## 2017-09-13 LAB — PROCALCITONIN: PROCALCITONIN: 1.31 ng/mL

## 2017-09-13 LAB — HEMOGLOBIN A1C
Hgb A1c MFr Bld: 5.5 % (ref 4.8–5.6)
Mean Plasma Glucose: 111.15 mg/dL

## 2017-09-13 MED ORDER — FUROSEMIDE 10 MG/ML IJ SOLN
40.0000 mg | Freq: Once | INTRAMUSCULAR | Status: AC
  Administered 2017-09-13: 40 mg via INTRAVENOUS
  Filled 2017-09-13: qty 4

## 2017-09-13 MED ORDER — ASPIRIN 325 MG PO TABS
325.0000 mg | ORAL_TABLET | Freq: Every day | ORAL | Status: DC
  Administered 2017-09-13: 325 mg
  Filled 2017-09-13: qty 1

## 2017-09-13 NOTE — Progress Notes (Signed)
OT Cancellation Note  Patient Details Name: TIEASHA LARSEN MRN: [id number] DOB: [birth year]   Cancelled Treatment:    Reason Eval/Treat Not Completed: Medical issues which prohibited therapy. Pt remains intubated and sedated. Will continue to follow and await MD clearance to proceed with OT.   Norman Herrlich, MS OTR/L  Pager: [phone]   Norman Herrlich 09/13/2017, 6:54 AM

## 2017-09-13 NOTE — Progress Notes (Signed)
[facility]    Date of Admission:  09/06/2017   Total days of antibiotics 7        Day 3 linezolid        Day 7 meropenem           ID: Kayla Kent is a 83 y.o. female with initial admit for upper gi bleed but also having progress oxygen needs found to have multifocal pneumonia thought to be due to aspiration pneumonia in the setting of new aphasia and right hemiplegia yesterday requiring medical thrombectomy.   Principal Problem:   Acute upper GI bleed Active Problems:   Paroxysmal atrial fibrillation (HCC)   HTN (hypertension)   Seizure disorder (HCC)   Chronic atrial fibrillation (HCC)   Abnormal urinalysis   Thyroid nodule   CKD (chronic kidney disease), stage III (HCC)   Dyslipidemia   Emphysema of lung (HCC)   Heme positive stool   Pressure injury of skin   Melena   Acute hypoxemic respiratory failure (HCC)   Cerebral thrombosis with cerebral infarction    Subjective: Is on trach trial about to be extubated. She is able to nod her head in understanding that she is going to be extubated  Medications:  . aspirin  325 mg Per Tube Daily  . atorvastatin  20 mg Oral q1800  . chlorhexidine gluconate (MEDLINE KIT)  15 mL Mouth Rinse BID  . furosemide  40 mg Intravenous Once  . levETIRAcetam  250 mg Oral BID  . mouth rinse  15 mL Mouth Rinse 10 times per day  . pantoprazole (PROTONIX) IV  40 mg Intravenous Q24H  . senna-docusate  1 tablet Per NG tube QHS  . sodium chloride flush  3 mL Intravenous Q12H    Objective: Vital signs in last 24 hours: Temp:  [97.6 F (36.4 C)-98.9 F (37.2 C)] 97.6 F (36.4 C) (11/27 1316) Pulse Rate:  [37-104] 89 (11/27 1500) Resp:  [13-21] 18 (11/27 1500) BP: (103-145)/(36-64) 145/54 (11/27 1500) SpO2:  [95 %-100 %] 96 % (11/27 1500) Arterial Line BP: (37-157)/(33-122) 66/57 (11/27 0200) FiO2 (%):  [40 %-50 %] 40 % (11/27 0803) Weight:  [112 lb 14 oz (51.2 kg)] 112 lb 14 oz (51.2 kg) (11/27  0430) Physical Exam  Constitutional:  Intubated, opens eyes to nameeasily arousable HENT: [place]/AT, PERRLA, no scleral icterus, right eye lateral gaze Mouth/Throat: OETT in place Cors = nl s1,s2 Pulmonary/Chest: Effort normal and breath sounds normal. No respiratory distress.  has no wheezes.  Abdominal: Soft. Bowel sounds are normal.  exhibits no distension. There is no tenderness.  Neurological: easily moves right arm to stimuli, no movement to LUE  Lab Results Recent Labs    09/12/17 0549 09/13/17 0334  WBC 17.3* 10.8*  HGB 10.0* 9.0*  HCT 29.5* 27.1*  NA 136 140  K 4.3 3.7  CL 106 108  CO2 22 25  BUN 24* 29*  CREATININE 0.75 0.80    Microbiology: 11/25 trach cx ngtd 11/25 blood cx ngtd Studies/Results: Ct Head Wo Contrast  Result Date: 09/11/2017 CLINICAL DATA:  Status post stroke intervention. EXAM: CT HEAD WITHOUT CONTRAST TECHNIQUE: Contiguous axial images were obtained from the base of the skull through the vertex without intravenous contrast. COMPARISON:  Head CT 09/11/2017 Cervical angiogram 09/11/2017 FINDINGS: Brain: There is a small amount of petechial hemorrhage versus contrast staining of the medial aspect of the left frontal operculum, coronal image 38. There is no intraparenchymal hematoma. No mass effect.  No extra-axial collection. Anterior right temporal lobe encephalomalacia is unchanged. There is periventricular hypoattenuation compatible with chronic microvascular disease. No hydrocephalus. Vascular: Intravascular hyperattenuation is likely persisting contrast material. Skull: Normal visualized skull base, calvarium and extracranial soft tissues. Sinuses/Orbits: No sinus fluid levels or advanced mucosal thickening. No mastoid effusion. Normal orbits. IMPRESSION: 1. Small focus of contrast staining versus petechial hemorrhage at the medial aspect of the left frontal operculum. 2. No intraparenchymal hematoma, mass effect or hydrocephalus. Electronically Signed   By:  Ulyses Jarred M.D.   On: 09/11/2017 23:42   Mr Brain Wo Contrast  Result Date: 09/13/2017 CLINICAL DATA:  Stroke follow-up.  Aphasia and right-sided weakness. EXAM: MRI HEAD WITHOUT CONTRAST TECHNIQUE: Multiplanar, multiecho pulse sequences of the brain and surrounding structures were obtained without intravenous contrast. COMPARISON:  Head CT 09/11/2017 FINDINGS: Brain: The midline structures are normal. There is diffusion restriction predominantly within the left precentral gyrus but also extending into the postcentral gyrus. Encephalomalacia is again noted at the right temporal pole. There is cytotoxic edema within the left frontal operculum. Scattered areas of white matter hyperintensity consistent with chronic microvascular disease. No hemorrhage. No age-advanced or lobar predominant atrophy. No chronic microhemorrhage or superficial siderosis. Vascular: Major intracranial arterial and venous sinus flow voids are preserved. Skull and upper cervical spine: The visualized skull base, calvarium, upper cervical spine and extracranial soft tissues are normal. Sinuses/Orbits: No fluid levels or advanced mucosal thickening. No mastoid or middle ear effusion. Normal orbits. IMPRESSION: 1. Acute ischemic infarct within the left hemisphere, predominantly located within the left precentral gyrus and frontal operculum, but also involving the left postcentral gyrus. 2. No hemorrhage or mass effect. 3. Findings of chronic microvascular disease. Electronically Signed   By: Ulyses Jarred M.D.   On: 09/13/2017 05:54   Dg Chest Port 1 View  Result Date: 09/13/2017 CLINICAL DATA:  83 year old female status post left MCA emergent large vessel occlusion and endovascular revascularization. EXAM: PORTABLE CHEST 1 VIEW COMPARISON:  Portable chest 09/11/2017 and earlier. FINDINGS: Portable AP semi upright view at 0614 hours. Stable endotracheal tube tip just below the level the clavicles. Enteric tube courses to the abdomen,  tip not included. Large lung volumes with coarse bilateral pulmonary interstitial opacity bilateral perihilar opacity has mildly regressed since 09/11/2017. Stable confluent left lung base opacity with obscuration of the left hemidiaphragm. No pneumothorax. No definite right pleural effusion. Stable cardiac size and mediastinal contours. Calcified aortic atherosclerosis. IMPRESSION: 1.  Stable lines and tubes. 2. Continued left pleural effusion with associated left lung base collapse or consolidation. 3. Coarse bilateral pulmonary interstitial opacity but regression of indistinct perihilar opacity (perhaps perihilar pulmonary edema) since 09/11/2017. Electronically Signed   By: Genevie Ann M.D.   On: 09/13/2017 07:40   Dg Chest Port 1 View  Result Date: 09/12/2017 CLINICAL DATA:  Endotracheal and OG tube placements EXAM: PORTABLE CHEST 1 VIEW COMPARISON:  09/11/2017 FINDINGS: Interval placement of an endotracheal tube with tip measuring 4.5 cm above the carina. An enteric tube has been placed. The tip is off the field of view but is well below the left hemidiaphragm. Heart size is normal. Again, there is diffuse bilateral airspace disease with a mostly perihilar distribution. Probable left pleural effusion. No pneumothorax. Calcification of the aorta. IMPRESSION: Appliances appear in satisfactory position. Again, there is diffuse bilateral pulmonary infiltration with small left pleural effusion. Changes likely represent multifocal pneumonia, ARDS, or edema. Electronically Signed   By: Lucienne Capers M.D.   On: 09/12/2017 00:20  Ir Percutaneous Art Thrombectomy/infusion Intracranial Inc Diag Angio  Result Date: 09/13/2017 INDICATION: Acute onset of right-sided paralysis, global aphasia and gaze deviation. NIH score of 24. EXAM: 1. EMERGENT LARGE VESSEL OCCLUSION THROMBOLYSIS (anterior CIRCULATION) COMPARISON:  CT scan of the brain of 09/11/2017. MEDICATIONS: Vancomycin 1 g IV antibiotic was administered  within 1 hour of the procedure. ANESTHESIA/SEDATION: General anesthesia. CONTRAST:  Isovue 300 approximately 100 mL. FLUOROSCOPY TIME:  Fluoroscopy Time: 40 minutes 18 seconds (2091 mGy). COMPLICATIONS: None immediate. TECHNIQUE: Following a full explanation of the procedure along with the potential associated complications, an informed witnessed consent was obtained from the patient's daughter. The risks of intracranial hemorrhage of 10%, worsening neurological deficit, ventilator dependency, death and inability to revascularize were all reviewed in detail with the patient's daughter. The patient was then put under general anesthesia by the Department of Anesthesiology at [facility]. The right groin was prepped and draped in the usual sterile fashion. Thereafter using modified Seldinger technique, transfemoral access into the right common femoral artery was obtained without difficulty. Over a 0.035 inch guidewire a 5 French Pinnacle sheath was inserted. Through this, and also over a 0.035 inch guidewire a 5 [place] JB 1 catheter was advanced to the aortic arch region and selectively positioned in the in the left common carotid artery. FINDINGS: The left common carotid arteriogram demonstrates the left external carotid artery and its major branches to be widely patent. The left internal carotid artery at the bulb demonstrates a smooth plaque along the posterior wall without associated significant stenosis by the NASCET criteria. Just distal to the bulb and also along the posterior wall there is a smooth lucency which probably represents an atherosclerotic smooth plaque. Distal to this the left internal carotid artery is seen to opacify to the cranial skull base. There is a focal segment of mild stenosis at the distal cervical segment. Distal to this the petrous, cavernous and supraclinoid segments demonstrate wide patency. The left middle cerebral artery has approximately 50% stenosis in the left M1 segment.  The trifurcation branches demonstrate patency with angiographic occlusion of the distal inferior division of the left middle cerebral artery. The delayed capillary and the arterial phase demonstrated a prominent area of hypoperfusion involving primarily the posterior 2/3 of the peri-insular cortical and subcortical regions. Transient filling via the anterior communicating artery of the right anterior cerebral artery A2 segment is seen. The right anterior cerebral artery is seen to opacify normally into the capillary and venous phases. PROCEDURE: ENDOVASCULAR COMPLETE REVASCULARIZATION OF THE OCCLUDED LEFT MIDDLE CEREBRAL ARTERY INFERIOR DIVISION DISTALLY WITH 2 PASSES WITH THE SOLITAIRE FR 4 MM X 40 MM RETRIEVAL DEVICE, AND USE OF 4.5 MG OF SUPER SELECTIVE INTRACRANIAL INTEGRELIN The diagnostic JB 1 catheter in the left common carotid artery was exchanged for a 0.035 inch 300 cm Rosen exchange guidewire for an 8 French 55 cm Brite tip neurovascular sheath using biplane roadmap technique and constant fluoroscopic guidance. Good aspiration was obtained from the side port of the hub of the neurovascular sheath. This was then connected to continuous heparinized saline infusion. Over the [employer] guidewire, an 8 [place] 85 cm FlowGate balloon guide catheter which had been prepped with 50% contrast and 50% heparinized saline infusion was then advanced and positioned just proximal to the left common carotid bifurcation. The Rosen exchange guidewire was then removed. Good aspiration obtained from the hub of the of FlowGate balloon guide catheter. Over a 0.035 inch Roadrunner guidewire, using biplane roadmap technique and constant fluoroscopic  guidance, the [facility] guide catheter was then advanced to the distal cervical segment of the left internal carotid artery. The guidewire was removed. Good aspiration obtained from the hub of the [facility] guide catheter. A gentle contrast injection demonstrated brisk antegrade  flow of contrast into the intracranial circulation without any changes. A combination of a Trevo ProVue microcatheter inside of a 115 cm 5 [place] Catalyst guide catheter was then advanced over a 0.014 inch Softip Synchro micro guidewire to the distal end of the [facility] guide catheter. With the micro guidewire leading with a J-tip configuration, the combination was then navigated without difficulty to the supraclinoid left ICA. Using a torque device, the micro guidewire was then gently advanced through the narrowed left middle cerebral artery into the occluded distal aspect of the inferior division of the left middle cerebral artery followed by the microcatheter. The guidewire was removed. There was resistance to the aspiration of blood at the hub of the microcatheter. The microcatheter tip was advanced gently retrieved until there was free aspiration of blood. A very gentle contrast injection demonstrated slow antegrade ascent of contrast distally. The 5 [place] Catalyst guide catheter was then advanced into the origin of the left middle cerebral artery. A 4 mm x 40 mm Solitaire retrieval device was then advanced to the distal end of the microcatheter. The O ring on the delivery microcatheter was then loosened. After having ascertained the placement of the retrieval device, with slight forward gentle traction with the right hand on the delivery micro guidewire, with the left hand the delivery microcatheter was then retrieved unsheathing the retrieval device. A control arteriogram performed through the 5 [place] Catalyst guide catheter demonstrated slow flow through the now patent previously occluded inferior division distally. The balloon of the [facility] guide catheter was then inflated in the left internal carotid artery distally for proximal flow arrest. The proximal portion of the retrieval device was then captured. Thereafter constant aspiration was applied with a 60 mL syringe at the hub of the [facility] guide  catheter and also at the hub of the 5 [place] guide catheter as the combination of the retrieval device, the microcatheter, the guide catheter was gently retrieved and removed. Aspiration was continued as the balloon was then deflated. There was no evidence of clot in the interstices of the retrieval device, or the aspirate. After having ascertained free back bleed at the hub of the 8 [place] FlowGate guide catheter a control arteriogram performed through the [facility] guide catheter in the left internal carotid artery demonstrated no change in the occluded left middle cerebral artery inferior division distally. A second pass was then made again as described above. A combination of the microcatheter inside of a Catalyst guide catheter and a 5 French 115 cm was advanced over a 0.014 inch Softip Synchro micro guidewire as described above with the microcatheter being again positioned distal to the known site of clot. Free aspiration of blood was obtained at the hub of the microcatheter prior to the placement of a 4 mm x 40 mm Solitaire FR retrieval device. Again the device was positioned and then deployed with slight forward gentle traction with the right hand on the delivery micro guidewire with the left hand the delivery microcatheter was retrieved. The Catalyst guide catheter was then advanced into the proximal left M1 segment. Thereafter after having captured the proximal portion of the retrieval device into the microcatheter, with proximal flow arrest in the left internal carotid artery, the combination of the retrieval device,  the microcatheter, and the Catalyst guide catheter was retrieved and removed as constant aspiration was applied with a 60 mL syringe at the hub of the [facility] guide catheter. Again aspiration was continued as the balloon was deflated in the left internal carotid artery to reverse flow arrest. Again back bleed was noted at the hub of the [facility] guide catheter. No significant clot  was noted in the aspirate, or on the retrieval device. Arteriogram performed through the 8 [place] FlowGate guide catheter in the left internal carotid artery demonstrated complete revascularization of the previously noted occluded inferior division of the left middle cerebral artery. Spasm was noted within the re-vascularized vessel, and also the left M1 segment. This resolved with 3 aliquots of 25 mics of nitroglycerin. Also at this time, approximately 4.5 mg of intra-arterial Integrilin was given. A control arteriogram performed at 10 minutes following this demonstrated complete resolution of the previously noted vasospasm with now brisk flow through the re-vascularized left middle cerebral artery inferior division distally. The remaining branches remained patent as did the anterior cerebral artery distribution. Throughout the procedure, the patient's blood pressure, neurological status and hemodynamic status remained stable. No evidence of extravasation of contrast, or mass-effect on the major vessel intracranially was noted. The 8 [place] FlowGate guide catheter and the neurovascular sheath were then retrieved into the abdominal aorta and exchanged for an 8 French Pinnacle sheath over a J-tip guidewire. A control arteriogram performed through the sheath demonstrated the puncture site for placement of an external closure device. An 8 [place] Angio-Seal was then positioned for hemostasis without difficulty. The patient's distal pulses remained Dopplerable in the dorsalis pedis and the posterior tibial arteries bilaterally unchanged from prior to the examination. IMPRESSION: Status post endovascular complete revascularization of occluded inferior division of the left middle cerebral artery distally with 2 passes with the Solitaire FR 4 mm x 40 mm retrieval device, and 4.5 mg of super selective intracranial Integrelin achieving a TICI 3 reperfusion. PLAN: Patient transported to the CT scan suite for postprocedural CT  scan of the brain. Electronically Signed   By: Luanne Bras M.D.   On: 09/12/2017 18:29   Ct Head Code Stroke Wo Contrast  Result Date: 09/11/2017 CLINICAL DATA:  Code stroke.  Right-sided weakness. EXAM: CT HEAD WITHOUT CONTRAST TECHNIQUE: Contiguous axial images were obtained from the base of the skull through the vertex without intravenous contrast. COMPARISON:  09/06/2017 CT of the head. FINDINGS: Brain: No evidence of acute infarction, hemorrhage, hydrocephalus, extra-axial collection or mass lesion/mass effect. Right anterior temporal lobe chronic encephalomalacia is unchanged. Foci of hypoattenuation predominantly in periventricular white matter is stable and compatible with mild chronic microvascular ischemic changes. Mild diffuse brain parenchymal volume loss is stable. Vascular: Calcific atherosclerosis of carotid siphons. Skull: Normal. Negative for fracture or focal lesion. Sinuses/Orbits: No acute finding. Other: None. ASPECTS [facility] Stroke Program Early CT Score) - Ganglionic level infarction (caudate, lentiform nuclei, internal capsule, insula, M1-M3 cortex): 7 - Supraganglionic infarction (M4-M6 cortex): 3 Total score (0-10 with 10 being normal): 10 IMPRESSION: 1. No acute intracranial abnormality identified. 2. Stable right anterior temporal lobe encephalomalacia, chronic microvascular ischemic changes, and parenchymal volume loss of the brain. 3. ASPECTS is 10 These results were called by telephone at the time of interpretation on 09/11/2017 at 8:04 pm to Dr. Lorraine Lax who verbally acknowledged these results. Electronically Signed   By: Kristine Garbe M.D.   On: 09/11/2017 20:04     Assessment/Plan: Aspiration pneumonia = currently on day 7 of  abtx. Plan that this is the last day of abtx and can stop and monitor  Leukocytosis = multifactorial- stress from CVA +/- pneumonitis.trending down  Will sign off. Call back if questions  [facility]axter Fl[facility]  [facility]fect[facility] Cell: [phone] Pager: [phone]  09/13/2017, 3:55 PM

## 2017-09-13 NOTE — Progress Notes (Signed)
Pharmacy Antibiotic Note  Kayla Kent is a 83 y.o. female admitted on 09/06/2017 with sepsis.   Continues on Zyvox / Meropenem - Day # 7 WBC = 10.8, Afebrile, Scr stable  Plan: Meropenem 1g IV q12h Zyvox 600 mg iv Q 12 Follow c/s, clinical progression, de-esc/LOT, renal function  Height: 5\' 7"  (170.2 cm) Weight: 112 lb 14 oz (51.2 kg) IBW/kg (Calculated) : 61.6  Temp (24hrs), Avg:98.5 F (36.9 C), Min:97.7 F (36.5 C), Max:99.5 F (37.5 C)  Recent Labs  Lab 09/06/17 1413  09/06/17 1911 09/07/17 0347  09/09/17 0339 09/10/17 0308 09/11/17 0549 09/11/17 0831 09/11/17 1119 09/12/17 0549 09/13/17 0334  WBC  --    < >  --  24.9*   < > 15.6* 8.9 18.6*  --   --  17.3* 10.8*  CREATININE  --   --   --  1.07*   < > 0.83 0.84 0.85  --   --  0.75 0.80  LATICACIDVEN 2.05*  --  3.7* 1.7  --   --   --   --  1.4 2.4*  --   --    < > = values in this interval not displayed.    Estimated Creatinine Clearance: 43.1 mL/min (by C-G formula based on SCr of 0.8 mg/dL).    Allergies  Allergen Reactions  . Chocolate Hives  . Codeine Other (See Comments)    Patient states she acts crazy  . Diazepam Other (See Comments)    Patient states she acts crazy.  . Fruit & Vegetable Daily [Nutritional Supplements] Hives and Swelling    peaches  . Iohexol Hives     Code: HIVES, Desc: pt gets 13 hr pre-meds, Onset Date: [id number]   . Latex Hives  . Peach [Prunus Persica] Hives  . Peanut-Containing Drug Products Hives and Swelling  . Penicillins Hives, Itching and Swelling    Has patient had a PCN reaction causing immediate rash, facial/tongue/throat swelling, SOB or lightheadedness with hypotension: Yes Has patient had a PCN reaction causing severe rash involving mucus membranes or skin necrosis: No Has patient had a PCN reaction that required hospitalization No Has patient had a PCN reaction occurring within the last 10 years: No If all of the above answers are "NO", then may proceed with  Cephalosporin use.   . Strawberry Extract Hives  . Sulfonamide Derivatives Hives    nausea  . Wheat Shortness Of Breath    Shortness of breath  . Wheat Bran Shortness Of Breath  . Sulfamethoxazole Hives  . Crestor [Rosuvastatin Calcium] Rash  . Iodine Rash    Cipro 11/20>>11/21 Meropenem 11/21>> Vancomycin 11/21>>11/25 Zyvox 11/25>>  11/20 Blood x 2: negF 11/20 Urine: >100K Kleb pneumo - ESBL, sensitive to cipro, imi, gent  11/20 MRSA PCR: neg 11/22 Strep pneumo Ag - negative 11/25 BC x 2 > negF  11/25 urine > negF  Albertina Parr, PharmD., BCPS Clinical Pharmacist Pager [phone]

## 2017-09-13 NOTE — Progress Notes (Signed)
Progress Note  Patient Name: Kayla Kent Date of Encounter: 09/13/2017  Primary Cardiologist: Dr. Harrington Challenger  Subjective   Patient suffered acute left inferior division of MCA stroke 11/26 , was intubated, went to emergency mechanical thrombectomy, with successful reestablishment of flow.  Per PCCM, plan is to extubate today. Sedation is off and she is currently weaning on the ventilator. She is easily aroused to my voice, follows commands and answers simple questions at bedside.     Inpatient Medications    Scheduled Meds: . aspirin  325 mg Per Tube Daily  . atorvastatin  20 mg Oral q1800  . chlorhexidine gluconate (MEDLINE KIT)  15 mL Mouth Rinse BID  . furosemide  40 mg Intravenous Once  . levETIRAcetam  250 mg Oral BID  . mouth rinse  15 mL Mouth Rinse 10 times per day  . pantoprazole (PROTONIX) IV  40 mg Intravenous Q24H  . senna-docusate  1 tablet Per NG tube QHS  . sodium chloride flush  3 mL Intravenous Q12H   Continuous Infusions: . sodium chloride    . linezolid (ZYVOX) IV 600 mg (09/13/17 0958)  . meropenem (MERREM) IV Stopped (09/13/17 0841)  . niCARDipine Stopped (09/12/17 2322)  . propofol (DIPRIVAN) infusion Stopped (09/13/17 0735)   PRN Meds: sodium chloride, acetaminophen **OR** acetaminophen (TYLENOL) oral liquid 160 mg/5 mL **OR** acetaminophen, fentaNYL (SUBLIMAZE) injection, fentaNYL (SUBLIMAZE) injection, hydrALAZINE, ipratropium-albuterol, labetalol, ondansetron (ZOFRAN) IV, sodium chloride   Vital Signs    Vitals:   09/13/17 0730 09/13/17 0800 09/13/17 0803 09/13/17 0900  BP:  (!) 131/51  (!) 139/55  Pulse:  (!) 37  84  Resp:  13  16  Temp:  98.1 F (36.7 C)    TempSrc:  Axillary    SpO2: 100% 100% 100% 100%  Weight:      Height:        Intake/Output Summary (Last 24 hours) at 09/13/2017 1025 Last data filed at 09/13/2017 0700 Gross per 24 hour  Intake 1345.61 ml  Output 1800 ml  Net -454.39 ml   Filed Weights   09/11/17 1700  09/12/17 0500 09/13/17 0430  Weight: 119 lb 0.8 oz (54 kg) 109 lb 12.6 oz (49.8 kg) 112 lb 14 oz (51.2 kg)    Physical Exam   General: Frail, thin, in NAD Skin: Warm, dry, intact  Head: Normocephalic, atraumatic, clear, moist mucus membranes. Eyes open to voice. Neck: Negative for carotid bruits. No JVD Lungs:Diminshed at the bases bilaterally. Pt is intubated and currently weaning. Breathing is unlabored. Cardiovascular: Irregularly irregular, no murmurs, rubs, or gallops.  Abdomen: Soft, non-tender, non-distended with normoactive bowel sounds. No guarding. No obvious abdominal masses. MSK: RUE with minimal movement. LUE/ LLE spontaneous movement to command.  Extremities: 1+ LE edema. No clubbing or cyanosis. DP/PT pulses 2+ bilaterally Neuro: Opens eyes to voice. Right sided facial droop.  Psych: Nods to questions appropriately    Labs    Chemistry Recent Labs  Lab 09/06/17 1102 09/07/17 0347  09/08/17 0336  09/11/17 0549 09/12/17 0549 09/13/17 0334  NA 138 139   < > 139   < > 135 136 140  K 3.8 4.0   < > 3.2*   < > 4.0 4.3 3.7  CL 102 109   < > 106   < > 104 106 108  CO2 26 23   < > 26   < > 22 22 25   GLUCOSE 106* 65   < > 91   < >  119* 147* 80  BUN 31* 22*   < > 19   < > 20 24* 29*  CREATININE 1.51* 1.07*   < > 1.14*   < > 0.85 0.75 0.80  CALCIUM 8.4* 7.8*   < > 7.5*   < > 7.5* 7.3* 7.7*  PROT 5.1* 4.7*  --  5.3*  --   --   --   --   ALBUMIN 2.6* 2.2*  --  2.2*  --   --   --   --   AST 66* 110*  --  109*  --   --   --   --   ALT 34 48  --  58*  --   --   --   --   ALKPHOS 105 86  --  109  --   --   --   --   BILITOT 1.2 1.4*  --  1.2  --   --   --   --   GFRNONAA 31* 47*   < > 43*   < > >60 >60 >60  GFRAA 36* 54*   < > 50*   < > >60 >60 >60  ANIONGAP 10 7   < > 7   < > 9 8 7    < > = values in this interval not displayed.     Hematology Recent Labs  Lab 09/11/17 0549 09/12/17 0549 09/13/17 0334  WBC 18.6* 17.3* 10.8*  RBC 4.04 3.22* 2.97*  HGB 12.5 10.0*  9.0*  HCT 36.7 29.5* 27.1*  MCV 90.8 91.6 91.2  MCH 30.9 31.1 30.3  MCHC 34.1 33.9 33.2  RDW 15.2 15.5 15.6*  PLT 197 208 242    Cardiac EnzymesNo results for input(s): TROPONINI in the last 168 hours. No results for input(s): TROPIPOC in the last 168 hours.   BNP Recent Labs  Lab 09/06/17 1649  BNP 722.5*     DDimer No results for input(s): DDIMER in the last 168 hours.   Radiology    Ct Head Wo Contrast  Result Date: 09/11/2017 CLINICAL DATA:  Status post stroke intervention. EXAM: CT HEAD WITHOUT CONTRAST TECHNIQUE: Contiguous axial images were obtained from the base of the skull through the vertex without intravenous contrast. COMPARISON:  Head CT 09/11/2017 Cervical angiogram 09/11/2017 FINDINGS: Brain: There is a small amount of petechial hemorrhage versus contrast staining of the medial aspect of the left frontal operculum, coronal image 38. There is no intraparenchymal hematoma. No mass effect. No extra-axial collection. Anterior right temporal lobe encephalomalacia is unchanged. There is periventricular hypoattenuation compatible with chronic microvascular disease. No hydrocephalus. Vascular: Intravascular hyperattenuation is likely persisting contrast material. Skull: Normal visualized skull base, calvarium and extracranial soft tissues. Sinuses/Orbits: No sinus fluid levels or advanced mucosal thickening. No mastoid effusion. Normal orbits. IMPRESSION: 1. Small focus of contrast staining versus petechial hemorrhage at the medial aspect of the left frontal operculum. 2. No intraparenchymal hematoma, mass effect or hydrocephalus. Electronically Signed   By: Ulyses Jarred M.D.   On: 09/11/2017 23:42   Mr Brain Wo Contrast  Result Date: 09/13/2017 CLINICAL DATA:  Stroke follow-up.  Aphasia and right-sided weakness. EXAM: MRI HEAD WITHOUT CONTRAST TECHNIQUE: Multiplanar, multiecho pulse sequences of the brain and surrounding structures were obtained without intravenous contrast.  COMPARISON:  Head CT 09/11/2017 FINDINGS: Brain: The midline structures are normal. There is diffusion restriction predominantly within the left precentral gyrus but also extending into the postcentral gyrus. Encephalomalacia is again noted at the right temporal  pole. There is cytotoxic edema within the left frontal operculum. Scattered areas of white matter hyperintensity consistent with chronic microvascular disease. No hemorrhage. No age-advanced or lobar predominant atrophy. No chronic microhemorrhage or superficial siderosis. Vascular: Major intracranial arterial and venous sinus flow voids are preserved. Skull and upper cervical spine: The visualized skull base, calvarium, upper cervical spine and extracranial soft tissues are normal. Sinuses/Orbits: No fluid levels or advanced mucosal thickening. No mastoid or middle ear effusion. Normal orbits. IMPRESSION: 1. Acute ischemic infarct within the left hemisphere, predominantly located within the left precentral gyrus and frontal operculum, but also involving the left postcentral gyrus. 2. No hemorrhage or mass effect. 3. Findings of chronic microvascular disease. Electronically Signed   By: Ulyses Jarred M.D.   On: 09/13/2017 05:54   Dg Chest Port 1 View  Result Date: 09/13/2017 CLINICAL DATA:  83 year old female status post left MCA emergent large vessel occlusion and endovascular revascularization. EXAM: PORTABLE CHEST 1 VIEW COMPARISON:  Portable chest 09/11/2017 and earlier. FINDINGS: Portable AP semi upright view at 0614 hours. Stable endotracheal tube tip just below the level the clavicles. Enteric tube courses to the abdomen, tip not included. Large lung volumes with coarse bilateral pulmonary interstitial opacity bilateral perihilar opacity has mildly regressed since 09/11/2017. Stable confluent left lung base opacity with obscuration of the left hemidiaphragm. No pneumothorax. No definite right pleural effusion. Stable cardiac size and mediastinal  contours. Calcified aortic atherosclerosis. IMPRESSION: 1.  Stable lines and tubes. 2. Continued left pleural effusion with associated left lung base collapse or consolidation. 3. Coarse bilateral pulmonary interstitial opacity but regression of indistinct perihilar opacity (perhaps perihilar pulmonary edema) since 09/11/2017. Electronically Signed   By: Genevie Ann M.D.   On: 09/13/2017 07:40   Dg Chest Port 1 View  Result Date: 09/12/2017 CLINICAL DATA:  Endotracheal and OG tube placements EXAM: PORTABLE CHEST 1 VIEW COMPARISON:  09/11/2017 FINDINGS: Interval placement of an endotracheal tube with tip measuring 4.5 cm above the carina. An enteric tube has been placed. The tip is off the field of view but is well below the left hemidiaphragm. Heart size is normal. Again, there is diffuse bilateral airspace disease with a mostly perihilar distribution. Probable left pleural effusion. No pneumothorax. Calcification of the aorta. IMPRESSION: Appliances appear in satisfactory position. Again, there is diffuse bilateral pulmonary infiltration with small left pleural effusion. Changes likely represent multifocal pneumonia, ARDS, or edema. Electronically Signed   By: Lucienne Capers M.D.   On: 09/12/2017 00:20   Ct Head Code Stroke Wo Contrast  Result Date: 09/11/2017 CLINICAL DATA:  Code stroke.  Right-sided weakness. EXAM: CT HEAD WITHOUT CONTRAST TECHNIQUE: Contiguous axial images were obtained from the base of the skull through the vertex without intravenous contrast. COMPARISON:  09/06/2017 CT of the head. FINDINGS: Brain: No evidence of acute infarction, hemorrhage, hydrocephalus, extra-axial collection or mass lesion/mass effect. Right anterior temporal lobe chronic encephalomalacia is unchanged. Foci of hypoattenuation predominantly in periventricular white matter is stable and compatible with mild chronic microvascular ischemic changes. Mild diffuse brain parenchymal volume loss is stable. Vascular:  Calcific atherosclerosis of carotid siphons. Skull: Normal. Negative for fracture or focal lesion. Sinuses/Orbits: No acute finding. Other: None. ASPECTS [facility] Stroke Program Early CT Score) - Ganglionic level infarction (caudate, lentiform nuclei, internal capsule, insula, M1-M3 cortex): 7 - Supraganglionic infarction (M4-M6 cortex): 3 Total score (0-10 with 10 being normal): 10 IMPRESSION: 1. No acute intracranial abnormality identified. 2. Stable right anterior temporal lobe encephalomalacia, chronic microvascular ischemic changes, and  parenchymal volume loss of the brain. 3. ASPECTS is 10 These results were called by telephone at the time of interpretation on 09/11/2017 at 8:04 pm to Dr. Lorraine Lax who verbally acknowledged these results. Electronically Signed   By: Kristine Garbe M.D.   On: 09/11/2017 20:04    Telemetry     Atrial fibrillation rates ranging from 70-80's- Personally Reviewed  ECG    09/07/17 Atrial fibrillation HR 88 - Personally Reviewed  Cardiac Studies   04/22/2017 Lexiscan Myoview Probable normal perfusion an mild soft tissue attenuation No significant ischemia. Images not gated due to atrial fibrillation. There are no significant changes in comparison to the prior study.  04/08/2017 Echo - Left ventricle: The cavity size was normal. Wall thickness was normal. Systolic function was normal. The estimated ejection fraction was in the range of 55% to 60%. Doppler parameters are consistent with both elevated ventricular end-diastolic filling pressure and elevated left atrial filling pressure. - Aortic valve: There was trivial regurgitation. - Mitral valve: There was mild regurgitation. Valve area by continuity equation (using LVOT flow): 1.22 cm^2. - Right atrium: The atrium was mildly dilated. - Atrial septum: There was a patent foramen ovale. - Pulmonary arteries: PA peak pressure: 49 mm Hg (S). - Pericardium, extracardiac: A trivial pericardial  effusion was identified.   Patient Profile     83 y.o. female with a hx of atrial fibrillation on Eliquis, hypertension, PAD, who presents with a GI bleed and possible aspiration PNA. Pt had resumed anticoagulation after a 5-day pause for the GI bleed when she was found to have suffered an acute left MCA stroke on 09/11/17 which was subsequently treated with emergency mechanical thrombectomy.   Assessment & Plan    1. Permanent Atrial fibrillation: -Rates continue to be controlled. She remains a high risk for recurrent embolic event due to anticoagulation hold and unfortunate events.  -Will defer full anticoagulation resumption to neurology, however, would prefer this as soon as possible.  -Continue ASA -Plans noted yesterday to start diltiazem, however do not see this ordered. Will discuss plan with Dr. Sallyanne Kuster -Would need to be aware not to drop BP too low given recent stroke.   2. HTN:  -BP stable today 125/51 -Nicardipine drip discontinued yesterday 11/26  3. UGI bleed -No s/s of bleeding -Remains on protonix -Hb stable at 9.0       4. Possible acute diastolic CHF -Pt +7.1I since admission  -Weight is up today by 3lbs since yesterday 11/26; 109>112lb.  -CXR today 11/27- left pleural effusion with consolidation. Bilateral interstitial opacities.  -Plan to resume lasix today -Consider echocardiogram. Last echo 03/2017 EF 55-60%   Signed, Kathyrn Drown, NP  09/13/2017, 10:25 AM    For questions or updates, please contact   Please consult [email or web address] for contact info under Cardiology/STEMI.  I have seen and examined the patient along with Kathyrn Drown, NP.  I have reviewed the chart, notes and new data.  I agree with NP's note.  Key new complaints: extubated, a little drowsy, but answers "yes/no" questions and follows commands Key examination changes: has a very wet, gurgly cough Key new findings / data: MRI today  IMPRESSION: 1. Acute ischemic infarct within the  left hemisphere, predominantly located within the left precentral gyrus and frontal operculum, but also involving the left postcentral gyrus. 2. No hemorrhage or mass effect. 3. Findings of chronic microvascular disease.  PLAN: Discussed resumption of anticoagulation with Dr. Leonie Man. Plan to wait at least 4-5 days after CVA, maybe  a week. Will use IV heparin if unable to swallow at that point. If she can swallow, the safest oral anticoagulant is still probably Eliquis (higher GI bleeding rates with Xarelto and warfarin, possibly also with Pradaxa). She is unable to take PO meds today. Ventricular rate is not fast and is not imperative to start diltiazem yet. If RVR develops, will use IV diltiazem.  Sanda Klein, MD, [place] [phone] 09/13/2017, 2:00 PM

## 2017-09-13 NOTE — Care Management Note (Signed)
Case Management Note  Patient Details  Name: Kayla Kent MRN: [id number] Date of Birth: 1932-10-15  Subjective/Objective:   Pt admitted on 09/06/17 initially with upper GI bleeding and PNA.  Pt developed new aphasia and Rt hemiplegia on 11/26 requiring medical  thrombectomy.                   Action/Plan: Pt now extubated; recommend PT/OT consults when able to tolerate therapies.    Expected Discharge Date:  (unknown)               Expected Discharge Plan:  Skilled Nursing Facility  In-House Referral:  Clinical Social Work  Discharge planning Services  CM Consult  Post Acute Care Choice:    Choice offered to:     DME Arranged:    DME Agency:     HH Arranged:    [place] Agency:     Status of Service:  In process, will continue to follow  If discussed at Long Length of Stay Meetings, dates discussed:    Additional Comments:  Reinaldo Raddle, RN, BSN  Trauma/Neuro ICU Case Manager [phone]

## 2017-09-13 NOTE — Progress Notes (Signed)
STROKE TEAM PROGRESS NOTE   HISTORY OF PRESENT ILLNESS (per record) Kayla Kent is an 83 y.o. female 83 y.o.femalewith medical history significant forstage III chronic kidney disease, dyslipidemia, chronic atrial fibrillation on Eliquis, hypertension, seizure disorder on AEDs, and recent FNA of thyroid nodule.   She was admittedfor GI bleed, and Eliquis was held. Restarted yesterday. Around 6:30 PM he was last seen normal during during shift change 7 PM the patient noted to be aphasic and not moving her right side and also respiratory distress. Stroke alert was called and CT head was obtained immediately did not show any hemorrhage Possible hyperdense MCA sign was noticed on the left side. Patient was allergic to contrast per chart, so CT angiogram was not performed. Patient was pre-medicated with hydrocortisone and Benadryl taken to NG tube for diagnostic angiogram with plan for mechanical thrombectomy if large vessel occlusion was.  Date last known well: 11.25.18 Time last known well: 6.30 pm tPA Given: no, on Eliquis NIHSS: 26 Baseline MRS 0     SUBJECTIVE (INTERVAL HISTORY) Her family is not present. The patient is intubated. She follows some commands.Bp well controlled.O2 sats are good on FiO2 40%   OBJECTIVE Temp:  [97.6 F (36.4 C)-98.9 F (37.2 C)] 97.6 F (36.4 C) (11/27 1316) Pulse Rate:  [37-104] 90 (11/27 1316) Cardiac Rhythm: Atrial fibrillation (11/27 0800) Resp:  [13-21] 19 (11/27 1316) BP: (99-143)/(35-64) 137/51 (11/27 1300) SpO2:  [95 %-100 %] 99 % (11/27 1316) Arterial Line BP: (37-157)/(33-122) 66/57 (11/27 0200) FiO2 (%):  [40 %-50 %] 40 % (11/27 0803) Weight:  [112 lb 14 oz (51.2 kg)] 112 lb 14 oz (51.2 kg) (11/27 0430)  CBC:  Recent Labs  Lab 09/12/17 0549 09/13/17 0334  WBC 17.3* 10.8*  NEUTROABS 16.0* 8.5*  HGB 10.0* 9.0*  HCT 29.5* 27.1*  MCV 91.6 91.2  PLT 208 161    Basic Metabolic Panel:  Recent Labs  Lab 09/08/17 0716   09/12/17 0549 09/13/17 0334  NA  --    < > 136 140  K  --    < > 4.3 3.7  CL  --    < > 106 108  CO2  --    < > 22 25  GLUCOSE  --    < > 147* 80  BUN  --    < > 24* 29*  CREATININE  --    < > 0.75 0.80  CALCIUM  --    < > 7.3* 7.7*  MG 2.0  --  1.8  --   PHOS  --   --  4.0  --    < > = values in this interval not displayed.    Lipid Panel:     Component Value Date/Time   CHOL 132 10/30/2015 0931   TRIG 111 09/11/2017 2341   TRIG 141 10/25/2006 0906   HDL 33 (L) 10/30/2015 0931   CHOLHDL 4.0 10/30/2015 0931   VLDL 14 10/30/2015 0931   LDLCALC 85 10/30/2015 0931   HgbA1c:  Lab Results  Component Value Date   HGBA1C 5.5 09/13/2017   Urine Drug Screen:     Component Value Date/Time   LABOPIA NONE DETECTED 04/06/2017 1944   COCAINSCRNUR NONE DETECTED 04/06/2017 1944   LABBENZ POSITIVE (A) 04/06/2017 1944   AMPHETMU NONE DETECTED 04/06/2017 1944   THCU NONE DETECTED 04/06/2017 1944   LABBARB NONE DETECTED 04/06/2017 1944    Alcohol Level     Component Value Date/Time   ETH <  5 04/06/2017 1907    IMAGING   Ct Head Wo Contrast 09/11/2017 IMPRESSION:  1. Small focus of contrast staining versus petechial hemorrhage at the medial aspect of the left frontal operculum.  2. No intraparenchymal hematoma, mass effect or hydrocephalus.     Ct Chest Wo Contrast 09/11/2017 IMPRESSION:  1. Findings are most compatible with severe multilobar bronchopneumonia.  2. Moderate bilateral pleural effusions with areas of dependent atelectasis in the lower lobes of lungs bilaterally.  3. Cardiomegaly.  4. Aortic atherosclerosis, in addition to left main and 3 vessel coronary artery disease.  5. Previously treated right upper lobe pulmonary nodule is now obscured by overlying airspace disease. Attention on followup studies is recommended.  Aortic Atherosclerosis (ICD10-I70.0).     Dg Chest Port 1 View 09/12/2017 IMPRESSION:  Appliances appear in satisfactory position.  Again, there is diffuse bilateral pulmonary infiltration with small left pleural effusion. Changes likely represent multifocal pneumonia, ARDS, or edema.    Dg Chest Port 1 View 09/11/2017 IMPRESSION:  1. Findings are compatible with worsening severe multilobar pneumonia, most evident throughout the mid to lower lungs bilaterally.  2. Small right and moderate left parapneumonic pleural effusions.  3. Cardiomegaly.  4. Aortic atherosclerosis.    Ct Head Code Stroke Wo Contrast 09/11/2017 IMPRESSION:  1. No acute intracranial abnormality identified.  2. Stable right anterior temporal lobe encephalomalacia, chronic microvascular ischemic changes, and parenchymal volume loss of the brain.  3. ASPECTS is 10    Cerbral  Angiogram - Dr. Estanislado Pandy 09/11/2017 S/P mLt common carotid arteriogram,followed by complete revascularization of occluded lt MCA distal inferior division   With x 2 passes with solitaire 25mm x 40 mm FR retrieval device, and 4.5 mg of IA INTEGRELIN IA achieving a  TICI 3 reperfusion.  MRI Brain :  Acute ischemic infarct within the left hemisphere, predominantly located within the left precentral gyrus and frontal operculum, but also involving the left postcentral gyrus.  Transthoracic Echocardiogram  04/08/2017 Study Conclusions  - Left ventricle: The cavity size was normal. Wall thickness was   normal. Systolic function was normal. The estimated ejection   fraction was in the range of 55% to 60%. Doppler parameters are   consistent with both elevated ventricular end-diastolic filling   pressure and elevated left atrial filling pressure. - Aortic valve: There was trivial regurgitation. - Mitral valve: There was mild regurgitation. Valve area by   continuity equation (using LVOT flow): 1.22 cm^2. - Right atrium: The atrium was mildly dilated. - Atrial septum: There was a patent foramen ovale. - Pulmonary arteries: PA peak pressure: 49 mm Hg (S). - Pericardium,  extracardiac: A trivial pericardial effusion was   identified.   Right LE Venous Dopplers 04/09/2017 No evidence of deep vein thrombosis involving the right lower extremity.      PHYSICAL EXAM Vitals:   09/13/17 1100 09/13/17 1200 09/13/17 1300 09/13/17 1316  BP: (!) 125/51 (!) 142/64 (!) 137/51   Pulse: 85 86 95 90  Resp: 16 18 16 19   Temp:    97.6 F (36.4 C)  TempSrc:    Oral  SpO2: 100% 95% 98% 99%  Weight:      Height:       Frail elderly caucasian lady who is intubated. . Afebrile. Head is nontraumatic. Neck is supple without bruit.    Cardiac exam no murmur or gallop. Lungs are clear to auscultation. Distal pulses are well felt. Neurological Exam :  Awake. Left gaze deviation improved. Mild exotropia right eye.  Unable to look to the right and cross midline. Blinks to threat on the left but not on the right. Mild right lower facial weakness. Tongue midline. Follows  midline and 1 step commands on the left. Right upper extremity is   weak but now able to move it against gravity. Able to move right lower extremity  but has mild weakness. Purposeful antigravity movement on the left side. Deep tendon reflexes are depressed in the right and normal on the left. Right plantar upgoing left downgoing.  ASSESSMENT/PLAN Ms. Kayla Kent is a 83 y.o. female with history of syncope, seizure disorder, PFO from echo 04/08/2017, peripheral vascular disease, atrial fibrillation on Eliquis, recent GI bleed, hypertension, recent thyroid biopsy, hyperlipidemia, emphysema, chronic kidney disease, and anxiety presenting with right-sided weakness, aphasia, and respiratory distress. She did not receive IV t-PA due to anticoagulation.  Cerbral  Angiogram / Intervention - Dr. Estanislado Pandy 09/11/2017 S/P mLt common carotid arteriogram,followed by complete revascularization of occluded lt MCA distal inferior division   With x 2 passes with solitaire 22mm x 40 mm FR retrieval device, and 4.5 mg of IA  INTEGRELIN IA achieving a  TICI 3 reperfusion.  Stroke:  Left middle cerebral artery - embolic secondary to atrial fibrillation. Eliquis recently held for GI bleed.  Resultant  Mild aphasia, right visual field loss and right hemiplegia  CT head - Small focus of contrast staining vs petechial hemorrhage at the medial aspect of the lt frontal operculum.   MRI head - - not performed  MRA head - not performed  Carotid Doppler - cerebral angiogram  2D Echo - 04/08/2017 - EF 55-60% - PFO noted  LDL - 85  HgbA1c - will order - pending  VTE prophylaxis - SCDs Diet NPO time specified  Eliquis (apixaban) daily prior to admission, now on No antithrombotic  Patient will be counseled to be compliant with her antithrombotic medications  Ongoing aggressive stroke risk factor management  Therapy recommendations:  pending  Disposition:  Pending  Hypertension  Stable  Permissive hypertension (OK if < 220/120) but gradually normalize in 5-7 days  Long-term BP goal normotensive  Hyperlipidemia  Home meds:  Lipitor 10 mg daily not resumed in hospital  LDL 85, goal < 70  Increase Lipitor to 20 mg daily  Continue statin at discharge   Other Stroke Risk Factors  Advanced age  Former cigarette smoker - quit 23 years ago  PFO  Afib on Eliquis (held for GI bleed)  Other Active Problems  Multi lobar pneumonia  Anemia - 10 / 29.5  CKD  Contrast allergy  Seizure disorder - Keppra  Currently not anticoagulated and not on antiplatelet therapy. (Recent upper GI bleed)   Plan / Recommendations    Continue aspirin for stroke prevention and switch  to eliquis in 5-7 days .Strict BP control SBP goal   below 160 Extubate as tolerated per PCCM. D/w Dr Sabra Heck and Dr. [facility] day # 7  I have personally examined this patient, reviewed notes, independently viewed imaging studies, participated in medical decision making and plan of care.ROS completed by me personally  and pertinent positives fully documented  I have made any additions or clarifications directly to the above note.  She presented with left M2 occlusion secondary to atrial fibrillation while anticoagulation was on hold due to recent GI bleed. She underwent emergent revascularization with mechanical thrombectomy and remains at risk for recurrent strokes. Plan extubate today as tolerated. . Discussed with Dr. Sabra Heck.and [place] This  patient is critically ill and at significant risk of neurological worsening, death and care requires constant monitoring of vital signs, hemodynamics,respiratory and cardiac monitoring, extensive review of multiple databases, frequent neurological assessment, discussion with family, other specialists and medical decision making of high complexity.I have made any additions or clarifications directly to the above note.This critical care time does not reflect procedure time, or teaching time or supervisory time of PA/NP/Med Resident etc but could involve care discussion time.  I spent 30 minutes of neurocritical care time  in the care of  this patient.     Antony Contras, MD Medical Director [facility] Pager: [phone] 09/13/2017 2:38 PM   To contact Stroke Continuity provider, please refer to [email or web address] After hours, contact General Neurology

## 2017-09-13 NOTE — Procedures (Signed)
Extubation Procedure Note  Patient Details:   Name: Kayla Kent DOB: 1932-02-24 MRN: [id number]   Airway Documentation:     Evaluation  O2 sats: stable throughout Complications: No apparent complications Patient did tolerate procedure well. Bilateral Breath Sounds: Clear   Yes  Incentive spirometer given 4l/min [place] No stridor, minimal vocalization, follows commands well.  Revonda Standard 09/13/2017, 11:42 AM

## 2017-09-13 NOTE — Progress Notes (Signed)
Called to room RN unable to draw off Aline and it is not correlating with the BP cuff.  Removed dressing tried flushing Aline and repositioning neither worked, it was not picking up a wave form by this time.  Removed Aline pressure held...  No events to report.

## 2017-09-13 NOTE — Progress Notes (Signed)
PULMONARY / CRITICAL CARE MEDICINE   Name: Kayla Kent MRN: [id number] DOB: 1932/03/27    ADMISSION DATE:  09/06/2017 CONSULTATION DATE:  09/11/2017  REFERRING MD:  Lesia Sago    CHIEF COMPLAINT:  Respiratory failure  HISTORY OF PRESENT ILLNESS:   83 year old female history of on apixaban, seizure disorder, documented COPD, right upper lobe lung cancer status post radiation therapy who initially presented 09/06/17 for confusion and hematemesis after being found down.  EGD was performed 11/23 which was only notable for patchy mild inflammation thoughout the stomach, respumed gastritis with stable hemoglobin and no further episodes of bleeding.  In addition she was being treated for an ESBL UTI based on urinalysis showing many bacteria and prior cultures showing ESBL E. coli.  Urine cultures here subsequently grew greater than 100,000 Klebsiella pneumoniae.  She was also noted to have multifocal infiltrates on her CT chest concerning for pneumonia.  Infectious disease was consulted on 11/25 and recommended changing vancomycin to linezolid for presumed aspiration causing her pneumonia throughout her admission her oxygen requirement has progressively increased to requiring a nonrebreather today to maintain oxygen saturation at 90% for which we were consulted.  Intubated to undergo medical thrombectomy by interventional radiology for acute stroke.  Patient to be assessed today for possible extubation.  Care plan discussed with stroke neurology service.     PAST MEDICAL HISTORY :  She  has a past medical history of Allergic rhinitis, Anxiety, CKD (chronic kidney disease) stage 3, GFR 30-59 ml/min (HCC), Emphysema of lung (HCC), HLD (hyperlipidemia), HTN (hypertension), Intolerance of drug, Paroxysmal supraventricular tachycardia ([place]), PVD (peripheral vascular disease) ([place]), Seizure ([place]), Syncope and collapse, and Uterine prolapse without mention of vaginal wall prolapse.    PAST  SURGICAL HISTORY: She  has a past surgical history that includes corrective eye surgery; Cholecystectomy; Tonsillectomy; IVD removed; stress cardiolite (08/05/93); RF ablation PSVT; Total hip arthroplasty (Right, 08/29/2014); Open reduction internal fixation (orif) distal radial fracture (Right, 08/29/2014); Cardioversion (N/A, 11/05/2014); cataract surgery (08/2015); Intramedullary (im) nail intertrochanteric (Left, 11/21/2016); Esophagogastroduodenoscopy (N/A, 09/09/2017); and Radiology with anesthesia (N/A, 09/11/2017).  Allergies  Allergen Reactions  . Chocolate Hives  . Codeine Other (See Comments)    Patient states she acts crazy  . Diazepam Other (See Comments)    Patient states she acts crazy.  . Fruit & Vegetable Daily [Nutritional Supplements] Hives and Swelling    peaches  . Iohexol Hives     Code: HIVES, Desc: pt gets 13 hr pre-meds, Onset Date: [id number]   . Latex Hives  . Peach [Prunus Persica] Hives  . Peanut-Containing Drug Products Hives and Swelling  . Penicillins Hives, Itching and Swelling    Has patient had a PCN reaction causing immediate rash, facial/tongue/throat swelling, SOB or lightheadedness with hypotension: Yes Has patient had a PCN reaction causing severe rash involving mucus membranes or skin necrosis: No Has patient had a PCN reaction that required hospitalization No Has patient had a PCN reaction occurring within the last 10 years: No If all of the above answers are "NO", then may proceed with Cephalosporin use.   . Strawberry Extract Hives  . Sulfonamide Derivatives Hives    nausea  . Wheat Shortness Of Breath    Shortness of breath  . Wheat Bran Shortness Of Breath  . Sulfamethoxazole Hives  . Crestor [Rosuvastatin Calcium] Rash  . Iodine Rash    No current facility-administered medications on file prior to encounter.    Current Outpatient Medications on File Prior to Encounter  Medication Sig  . ALPRAZolam (XANAX) 0.25 MG tablet TAKE 1 TABLET  BY MOUTH TWICE DAILY AS NEEDED  . amLODipine (NORVASC) 2.5 MG tablet TAKE ONE TABLET BY MOUTH ONCE DAILY  . atorvastatin (LIPITOR) 10 MG tablet TAKE ONE TABLET BY MOUTH ONCE DAILY AT 6 PM  . bismuth subsalicylate (PEPTO BISMOL) 262 MG/15ML suspension Take 30 mLs by mouth every 6 (six) hours as needed for indigestion.  Marland Kitchen DILT-XR 180 MG 24 hr capsule TAKE 1 CAPSULE BY MOUTH ONCE DAILY  . ELIQUIS 2.5 MG TABS tablet TAKE ONE TABLET BY MOUTH TWICE DAILY  . levETIRAcetam (KEPPRA) 250 MG tablet Take 1 tablet (250 mg total) by mouth 2 (two) times daily.  Marland Kitchen oxybutynin (DITROPAN XL) 10 MG 24 hr tablet Take 1 tablet (10 mg total) by mouth at bedtime.  . pantoprazole (PROTONIX) 40 MG tablet Take 1 tablet (40 mg total) by mouth daily.  . sodium chloride (OCEAN) 0.65 % SOLN nasal spray Place 1 spray into both nostrils as needed for congestion.  . traZODone (DESYREL) 50 MG tablet Take 50 mg by mouth at bedtime.   . ciprofloxacin (CIPRO) 250 MG tablet Take 1 tablet (250 mg total) by mouth 2 (two) times daily. (Patient not taking: Reported on 09/06/2017)  . ibuprofen (ADVIL,MOTRIN) 600 MG tablet Take 1 tablet (600 mg total) by mouth every 6 (six) hours as needed. (Patient not taking: Reported on 09/06/2017)  . methocarbamol (ROBAXIN) 500 MG tablet Take 1 tablet (500 mg total) by mouth at bedtime as needed for muscle spasms. (Patient not taking: Reported on 09/06/2017)  . traMADol (ULTRAM) 50 MG tablet Take 1-2 tablets (50-100 mg total) by mouth every 6 (six) hours as needed for moderate pain or severe pain. (Patient not taking: Reported on 09/06/2017)    VITAL SIGNS: BP (!) 131/51   Pulse (!) 37   Temp 98.1 F (36.7 C) (Axillary)   Resp 13   Ht 5\' 7"  (1.702 m)   Wt 51.2 kg (112 lb 14 oz)   SpO2 100%   BMI 17.68 kg/m   HEMODYNAMICS:    VENTILATOR SETTINGS: Vent Mode: PSV;CPAP FiO2 (%):  [40 %-60 %] 40 % Set Rate:  [15 bmp] 15 bmp Vt Set:  [500 mL] 500 mL PEEP:  [5 cmH20] 5 cmH20 Pressure Support:   [5 cmH20-8 cmH20] 5 cmH20  INTAKE / OUTPUT: I/O last 3 completed shifts: In: 2990.1 [I.V.:1890.1; Other:200; IV Piggyback:900] Out: 2800 [Urine:2600; Blood:200]  PHYSICAL EXAMINATION: General: Awake and following commands. On PSV. No distress observed. Neuro: eyes open.  Right upper extremity without motion.  Right lower extremity with some movement to command.  Both left upper and left lower extremity movement spontaneously.  Pupils equally reactive to light.  HEENT: ETT, OGT.  No icterus. No JVD. Cardiovascular: S1, S2.  Irregularly irregular rhythm.  Rate approximately 80 bpm.  No S3 gallop, murmur, or rub. Lungs: Diminished breath sounds at the bases.  Scattered rhonchi.  No wheezes.  No obvious crackles. Abdomen: Flat, nontender.  No muscle guarding.  No organomegaly. Musculoskeletal: No pitting edema, clubbing, cyanosis. Skin: No ecchymoses.  LABS:  BMET Recent Labs  Lab 09/11/17 0549 09/12/17 0549 09/13/17 0334  NA 135 136 140  K 4.0 4.3 3.7  CL 104 106 108  CO2 22 22 25   BUN 20 24* 29*  CREATININE 0.85 0.75 0.80  GLUCOSE 119* 147* 80    Electrolytes Recent Labs  Lab 09/07/17 1620  09/08/17 0716  09/11/17 0549 09/12/17 0549 09/13/17  0539  CALCIUM 8.0*   < >  --    < > 7.5* 7.3* 7.7*  MG 1.7  --  2.0  --   --  1.8  --   PHOS  --   --   --   --   --  4.0  --    < > = values in this interval not displayed.    CBC Recent Labs  Lab 09/11/17 0549 09/12/17 0549 09/13/17 0334  WBC 18.6* 17.3* 10.8*  HGB 12.5 10.0* 9.0*  HCT 36.7 29.5* 27.1*  PLT 197 208 242    Coag's Recent Labs  Lab 09/11/17 2010 09/13/17 0334  APTT 36  --   INR 1.22 1.17    Sepsis Markers Recent Labs  Lab 09/07/17 0347 09/11/17 0831 09/11/17 1119 09/12/17 0549 09/13/17 0334  LATICACIDVEN 1.7 1.4 2.4*  --   --   PROCALCITON  --  2.17  --  1.90 1.31    ABG Recent Labs  Lab 09/11/17 1255 09/11/17 2359 09/13/17 0405  PHART 7.529* 7.350 7.495*  PCO2ART 31.7* 44.0  33.9  PO2ART 58.7* 79.2* 117*    Liver Enzymes Recent Labs  Lab 09/06/17 1102 09/07/17 0347 09/08/17 0336  AST 66* 110* 109*  ALT 34 48 58*  ALKPHOS 105 86 109  BILITOT 1.2 1.4* 1.2  ALBUMIN 2.6* 2.2* 2.2*    Cardiac Enzymes No results for input(s): TROPONINI, PROBNP in the last 168 hours.  Glucose No results for input(s): GLUCAP in the last 168 hours.  Imaging Mr Brain Wo Contrast  Result Date: 09/13/2017 CLINICAL DATA:  Stroke follow-up.  Aphasia and right-sided weakness. EXAM: MRI HEAD WITHOUT CONTRAST TECHNIQUE: Multiplanar, multiecho pulse sequences of the brain and surrounding structures were obtained without intravenous contrast. COMPARISON:  Head CT 09/11/2017 FINDINGS: Brain: The midline structures are normal. There is diffusion restriction predominantly within the left precentral gyrus but also extending into the postcentral gyrus. Encephalomalacia is again noted at the right temporal pole. There is cytotoxic edema within the left frontal operculum. Scattered areas of white matter hyperintensity consistent with chronic microvascular disease. No hemorrhage. No age-advanced or lobar predominant atrophy. No chronic microhemorrhage or superficial siderosis. Vascular: Major intracranial arterial and venous sinus flow voids are preserved. Skull and upper cervical spine: The visualized skull base, calvarium, upper cervical spine and extracranial soft tissues are normal. Sinuses/Orbits: No fluid levels or advanced mucosal thickening. No mastoid or middle ear effusion. Normal orbits. IMPRESSION: 1. Acute ischemic infarct within the left hemisphere, predominantly located within the left precentral gyrus and frontal operculum, but also involving the left postcentral gyrus. 2. No hemorrhage or mass effect. 3. Findings of chronic microvascular disease. Electronically Signed   By: Ulyses Jarred M.D.   On: 09/13/2017 05:54   Dg Chest Port 1 View  Result Date: 09/13/2017 CLINICAL DATA:   83 year old female status post left MCA emergent large vessel occlusion and endovascular revascularization. EXAM: PORTABLE CHEST 1 VIEW COMPARISON:  Portable chest 09/11/2017 and earlier. FINDINGS: Portable AP semi upright view at 0614 hours. Stable endotracheal tube tip just below the level the clavicles. Enteric tube courses to the abdomen, tip not included. Large lung volumes with coarse bilateral pulmonary interstitial opacity bilateral perihilar opacity has mildly regressed since 09/11/2017. Stable confluent left lung base opacity with obscuration of the left hemidiaphragm. No pneumothorax. No definite right pleural effusion. Stable cardiac size and mediastinal contours. Calcified aortic atherosclerosis. IMPRESSION: 1.  Stable lines and tubes. 2. Continued left pleural effusion with associated  left lung base collapse or consolidation. 3. Coarse bilateral pulmonary interstitial opacity but regression of indistinct perihilar opacity (perhaps perihilar pulmonary edema) since 09/11/2017. Electronically Signed   By: Genevie Ann M.D.   On: 09/13/2017 07:40   TREMAINE FUHRIMAN  ECHO COMPLETE WO IMAGING ENHANCING AGENT  Order# [id number]  Reading physician: Josue Hector, MD Ordering physician: Rise Patience, MD Study date: 04/08/17  Study Result   Result status: Final result                              *[facility]*                         [address], [place] [address]                            [phone]  ------------------------------------------------------------------- Transthoracic Echocardiography  Patient:    Sidda, Humm MR #:       [id number] Study Date: 04/08/2017 Gender:     F Age:        20 Height:     170.2 cm Weight:     56.7 kg BSA:        1.63 m^2 Pt. Status: Room:       3E06C   ADMITTING    Linward Foster  REFERRING    Rise Patience  PERFORMING   [place], Inpatient  SONOGRAPHER  Mikki Santee  ATTENDING    Fatima Blank  cc:  ------------------------------------------------------------------- LV EF: 55% -   60%  ------------------------------------------------------------------- History:   PMH:  Elevated Troponin.  Risk factors:  Hypertension. Dyslipidemia.  ------------------------------------------------------------------- Study Conclusions  - Left ventricle: The cavity size was normal. Wall thickness was   normal. Systolic function was normal. The estimated ejection   fraction was in the range of 55% to 60%. Doppler parameters are   consistent with both elevated ventricular end-diastolic filling   pressure and elevated left atrial filling pressure. - Aortic valve: There was trivial regurgitation. - Mitral valve: There was mild regurgitation. Valve area by   continuity equation (using LVOT flow): 1.22 cm^2. - Right atrium: The atrium was mildly dilated. - Atrial septum: There was a patent foramen ovale. - Pulmonary arteries: PA peak pressure: 49 mm Hg (S). - Pericardium, extracardiac: A trivial pericardial effusion was   identified.  ------------------------------------------------------------------- Study data:  Comparison was made to the study of 09/23/2014.  Study status:  Routine.  Procedure:  The patient reported no pain pre or post test. Transthoracic echocardiography. Image quality was adequate.  Study completion:  There were no complications. Transthoracic echocardiography.  M-mode, complete 2D, spectral Doppler, and color Doppler.  Birthdate:  Patient birthdate: Oct 22, 1931.  Age:  Patient is 83 yr old.  Sex:  Gender: female. BMI: 19.6 kg/m^2.  Blood pressure:     110/50  Patient status: Inpatient.  Study date:  Study date: 04/08/2017. Study time: 06:27 PM.  Location:   Bedside.  -------------------------------------------------------------------  ------------------------------------------------------------------- Left ventricle:  The cavity size was normal. Wall thickness was  normal. Systolic function was normal. The estimated ejection fraction was in the range of 55% to 60%. Doppler parameters are consistent with both elevated ventricular end-diastolic filling pressure and elevated left atrial filling pressure.  ------------------------------------------------------------------- Aortic valve:   Trileaflet; mildly thickened, mildly calcified leaflets.  Doppler:   There was no stenosis.   There was trivial regurgitation.  ------------------------------------------------------------------- Aorta:  The aorta was normal, not dilated, and non-diseased.  ------------------------------------------------------------------- Mitral valve:   Mildly thickened leaflets .  Doppler:  There was mild regurgitation.    Valve area by continuity equation (using LVOT flow): 1.22 cm^2. Indexed valve area by continuity equation (using LVOT flow): 0.75 cm^2/m^2.    Mean gradient (D): 3 mm Hg.   ------------------------------------------------------------------- Left atrium:  The atrium was normal in size.  ------------------------------------------------------------------- Atrial septum:  There was a patent foramen ovale.  ------------------------------------------------------------------- Right ventricle:  The cavity size was normal. Wall thickness was normal. Systolic function was normal.  ------------------------------------------------------------------- Pulmonic valve:    Doppler:  There was mild regurgitation.  ------------------------------------------------------------------- Tricuspid valve:   Doppler:  There was mild regurgitation.  ------------------------------------------------------------------- Right atrium:  The atrium was mildly  dilated.  ------------------------------------------------------------------- Pericardium:  A trivial pericardial effusion was identified.  ------------------------------------------------------------------- Systemic veins: Inferior vena cava: The vessel was dilated. The respirophasic diameter changes were blunted (< 50%), consistent with elevated central venous pressure.  ------------------------------------------------------------------- Post procedure conclusions Ascending Aorta:  - The aorta was normal, not dilated, and non-diseased.  ------------------------------------------------------------------- Measurements   Left ventricle                           Value          Reference  LV ID, ED, PLAX chordal          (L)     41    mm       43 - 52  LV ID, ES, PLAX chordal                  28    mm       23 - 38  LV fx shortening, PLAX chordal           32    %        >=29  LV PW thickness, ED                      7     mm       ----------  IVS/LV PW ratio, ED                      1.14           <=1.3  Stroke volume, 2D                        41    ml       ----------  Stroke volume/bsa, 2D                    25    ml/m^2   ----------  LV e&', lateral                           8.92  cm/s     ----------  LV e&', medial  10.2  cm/s     ----------  LV e&', average                           9.56  cm/s     ----------    Ventricular septum                       Value          Reference  IVS thickness, ED                        8     mm       ----------    LVOT                                     Value          Reference  LVOT ID, S                               16    mm       ----------  LVOT area                                2.01  cm^2     ----------  LVOT peak velocity, S                    103   cm/s     ----------  LVOT mean velocity, S                    66.8  cm/s     ----------  LVOT VTI, S                              20.2  cm        ----------  LVOT peak gradient, S                    4     mm Hg    ----------    Aortic valve                             Value          Reference  Aortic regurg pressure half-time         872   ms       ----------    Aorta                                    Value          Reference  Aortic root ID, ED                       28    mm       ----------    Left atrium                              Value  Reference  LA ID, A-P, ES                           35    mm       ----------  LA ID/bsa, A-P                           2.15  cm/m^2   <=2.2  LA volume, S                             75.3  ml       ----------  LA volume/bsa, S                         46.2  ml/m^2   ----------  LA volume, ES, 1-p A4C                   65.9  ml       ----------  LA volume/bsa, ES, 1-p A4C               40.4  ml/m^2   ----------  LA volume, ES, 1-p A2C                   82.4  ml       ----------  LA volume/bsa, ES, 1-p A2C               50.5  ml/m^2   ----------    Mitral valve                             Value          Reference  Mitral mean velocity, D                  70.6  cm/s     ----------  Mitral deceleration time                 208   ms       150 - 230  Mitral mean gradient, D                  3     mm Hg    ----------  Mitral E/A ratio, peak                   4.6            ----------  Mitral valve area, LVOT                  1.22  cm^2     ----------  continuity  Mitral valve area/bsa, LVOT              0.75  cm^2/m^2 ----------  continuity  Mitral annulus VTI, D                    33.2  cm       ----------    Pulmonary arteries                       Value          Reference  PA pressure, S, DP               (H)  49    mm Hg    <=30    Tricuspid valve                          Value          Reference  Tricuspid regurg peak velocity           321   cm/s     ----------  Tricuspid peak RV-RA gradient            41    mm Hg    ----------    Right atrium                              Value          Reference  RA ID, S-I, ES, A4C              (H)     60.1  mm       34 - 49  RA area, ES, A4C                 (H)     23.6  cm^2     8.3 - 19.5  RA volume, ES, A/L                       74.1  ml       ----------  RA volume/bsa, ES, A/L                   45.5  ml/m^2   ----------    Systemic veins                           Value          Reference  Estimated CVP                            8     mm Hg    ----------    Right ventricle                          Value          Reference  TAPSE                                    12.6  mm       ----------  RV pressure, S, DP               (H)     49    mm Hg    <=30  RV s&', lateral, S                        9.36  cm/s     ----------  Legend: (L)  and  (H)  mark values outside specified reference range.  ------------------------------------------------------------------- Prepared and Electronically Authenticated by  Jenkins Rouge, M.D. 2018-06-23T11:04:29     STUDIES:  Luanne Bras, MD  Physician  Radiology  Procedures  Signed  Date of Service:  09/11/2017 11:04 PM        Pre-procedure Diagnoses  Middle cerebral artery embolism, left [I66.02]  Post-procedure Diagnoses  Middle cerebral artery embolism,  left [I66.02]  Procedures  CEREBRAL ANGIOGRAM [[id number] (Custom)]    Signed           S/P mLt common carotid arteriogram,followed by complete revascularization of occluded lt MCA distal inferior division   With x 2 passes with solitaire 68mm x 40 mm FR retrieval device, and 4.5 mg of IA INTEGRELIN IA achieving a TICI 3 reperfusion.           Chest xray 11/27: CLINICAL DATA:  83 year old female status post left MCA emergent large vessel occlusion and endovascular revascularization.  EXAM: PORTABLE CHEST 1 VIEW  COMPARISON:  Portable chest 09/11/2017 and earlier.  FINDINGS: Portable AP semi upright view at 0614 hours. Stable endotracheal tube tip just below the level the clavicles. Enteric  tube courses to the abdomen, tip not included.  Large lung volumes with coarse bilateral pulmonary interstitial opacity bilateral perihilar opacity has mildly regressed since 09/11/2017. Stable confluent left lung base opacity with obscuration of the left hemidiaphragm. No pneumothorax. No definite right pleural effusion.  Stable cardiac size and mediastinal contours. Calcified aortic atherosclerosis.  IMPRESSION: 1.  Stable lines and tubes. 2. Continued left pleural effusion with associated left lung base collapse or consolidation. 3. Coarse bilateral pulmonary interstitial opacity but regression of indistinct perihilar opacity (perhaps perihilar pulmonary edema) since 09/11/2017.   Electronically Signed   By: Genevie Ann M.D.   On: 09/13/2017 07:40   CULTURES: Respiratory viral panel negative Urine culture: >100k klebsiella 09/11/17 blood culture pending MRSA screen negative No new results. 11/27   ANTIBIOTICS: cipro 11/20-11/21 Meropenem 11/21>>> Vancomycin 11/22-11/24 linezolid 11/25>>   SIGNIFICANT EVENTS: Status post thrombectomy of the left MCA.  LINES/TUBES: Peripherals  DISCUSSION: 83 year old female developed a aphasia and right hemiplegia yesterday requiring medical thrombectomy.  Patient intubated.  ASSESSMENT / PLAN:  PULMONARY A: 1.  Acute respiratory failure with hypoxemia; improved with diuresis. 2.  COPD, undocumented 3.  Pleural effusions P:   1. Plan is to extubate today.  2.  As needed bronchodilators.  This will help minimize the adverse cardiovascular effects of albuterol.  COPD not documented at this time.   CARDIOVASCULAR A:  Atrial fibrillation. Probable diastolic congestive heart failure P:  1.  Being evaluated by cardiology.  Patient not a current candidate for anticoagulation. 2. Begin cardizem for hypertension and atrial fibrillation if ok with neurology. 3.  Maintain a net negative fluid balance of -1 L/day.  Will  receive additional dose of lasix today.  RENAL A: No issues.  P:   No current plan.  GASTROINTESTINAL A: Gastritis, status post EGD  P: 1.  Continue Protonix.  Continue to monitor the hemoglobin.  HEMATOLOGIC A: No active issues   INFECTIOUS A:   Aspiration pneumonia  P:   -Continue empiric linezolid and meropenem per infectious disease recommendations.  Day #7 of antibiotics. All cultures negative to date. Would consider 7 days of therapy adequate treatment. Will see if ID concurs.   ENDOCRINE A: No active issues  NEUROLOGIC A: Acute stroke  P:   1.  Status post medical thrombectomy. 2.  Not a candidate for anticoagulation given recent history of GI bleeding.  FAMILY  - Updates: No family at the bedside  - Inter-disciplinary family meet or Palliative Care meeting due by: day 7   Salley Scarlet, M.D. Pulmonary and [place] Pager: [phone]  Critical care time spent independently equals 35 minutes.  09/13/2017, 9:30 AM

## 2017-09-13 NOTE — Progress Notes (Signed)
Supervising Physician: Luanne Bras  Patient Status:  [facility] - In-pt  Chief Complaint: Left MCA CVA  Subjective: Extubated, alert.  Making small, notable improvements.   Allergies: Chocolate; Codeine; Diazepam; Fruit & vegetable daily [nutritional supplements]; Iohexol; Latex; Peach [prunus persica]; Peanut-containing drug products; Penicillins; Strawberry extract; Sulfonamide derivatives; Wheat; Wheat bran; Sulfamethoxazole; Crestor [rosuvastatin calcium]; and Iodine  Medications: Prior to Admission medications   Medication Sig Start Date End Date Taking? Authorizing Provider  ALPRAZolam Duanne Moron) 0.25 MG tablet TAKE 1 TABLET BY MOUTH TWICE DAILY AS NEEDED 07/12/17  Yes Biagio Borg, MD  amLODipine (NORVASC) 2.5 MG tablet TAKE ONE TABLET BY MOUTH ONCE DAILY 04/01/17  Yes Fay Records, MD  atorvastatin (LIPITOR) 10 MG tablet TAKE ONE TABLET BY MOUTH ONCE DAILY AT 6 PM 06/27/17  Yes Bhagat, Bhavinkumar, PA  bismuth subsalicylate (PEPTO BISMOL) 262 MG/15ML suspension Take 30 mLs by mouth every 6 (six) hours as needed for indigestion.   Yes [provider]  DILT-XR 180 MG 24 hr capsule TAKE 1 CAPSULE BY MOUTH ONCE DAILY 06/10/17  Yes Biagio Borg, MD  ELIQUIS 2.5 MG TABS tablet TAKE ONE TABLET BY MOUTH TWICE DAILY 10/08/16  Yes Fay Records, MD  levETIRAcetam (KEPPRA) 250 MG tablet Take 1 tablet (250 mg total) by mouth 2 (two) times daily. 05/09/17  Yes Dohmeier, Asencion Partridge, MD  oxybutynin (DITROPAN XL) 10 MG 24 hr tablet Take 1 tablet (10 mg total) by mouth at bedtime. 07/28/17  Yes Biagio Borg, MD  pantoprazole (PROTONIX) 40 MG tablet Take 1 tablet (40 mg total) by mouth daily. 06/28/17  Yes Biagio Borg, MD  sodium chloride (OCEAN) 0.65 % SOLN nasal spray Place 1 spray into both nostrils as needed for congestion.   Yes [provider]  traZODone (DESYREL) 50 MG tablet Take 50 mg by mouth at bedtime.  06/07/17  Yes [provider]  ciprofloxacin (CIPRO) 250 MG  tablet Take 1 tablet (250 mg total) by mouth 2 (two) times daily. Patient not taking: Reported on 09/06/2017 07/12/17   Nche, Charlene Brooke, NP  ibuprofen (ADVIL,MOTRIN) 600 MG tablet Take 1 tablet (600 mg total) by mouth every 6 (six) hours as needed. Patient not taking: Reported on 09/06/2017 06/27/17   Fatima Blank, MD  methocarbamol (ROBAXIN) 500 MG tablet Take 1 tablet (500 mg total) by mouth at bedtime as needed for muscle spasms. Patient not taking: Reported on 09/06/2017 07/18/17   Biagio Borg, MD  traMADol (ULTRAM) 50 MG tablet Take 1-2 tablets (50-100 mg total) by mouth every 6 (six) hours as needed for moderate pain or severe pain. Patient not taking: Reported on 09/06/2017 11/25/16   Rama, Venetia Maxon, MD     Vital Signs: BP (!) 138/57   Pulse 86   Temp 97.6 F (36.4 C) (Oral)   Resp 19   Ht 5\' 7"  (1.702 m)   Wt 112 lb 14 oz (51.2 kg)   SpO2 100%   BMI 17.68 kg/m   Physical Exam  NAD, alert Neuro:  Improved movement on the right side. Able to lift R arm and leg against gravity, hand grip strength 0/5.  Facial droop noted.  Able to vocalize, but not forming words.  Groin: soft, intact, no evidence of hematoma Pulses: faint palpable pulses bilaterally  Imaging: Ct Head Wo Contrast  Result Date: 09/11/2017 CLINICAL DATA:  Status post stroke intervention. EXAM: CT HEAD WITHOUT CONTRAST TECHNIQUE: Contiguous axial images were obtained from the base  of the skull through the vertex without intravenous contrast. COMPARISON:  Head CT 09/11/2017 Cervical angiogram 09/11/2017 FINDINGS: Brain: There is a small amount of petechial hemorrhage versus contrast staining of the medial aspect of the left frontal operculum, coronal image 38. There is no intraparenchymal hematoma. No mass effect. No extra-axial collection. Anterior right temporal lobe encephalomalacia is unchanged. There is periventricular hypoattenuation compatible with chronic microvascular disease. No hydrocephalus.  Vascular: Intravascular hyperattenuation is likely persisting contrast material. Skull: Normal visualized skull base, calvarium and extracranial soft tissues. Sinuses/Orbits: No sinus fluid levels or advanced mucosal thickening. No mastoid effusion. Normal orbits. IMPRESSION: 1. Small focus of contrast staining versus petechial hemorrhage at the medial aspect of the left frontal operculum. 2. No intraparenchymal hematoma, mass effect or hydrocephalus. Electronically Signed   By: Ulyses Jarred M.D.   On: 09/11/2017 23:42   Ct Chest Wo Contrast  Result Date: 09/11/2017 CLINICAL DATA:  83 year old female with history of shortness of breath. EXAM: CT CHEST WITHOUT CONTRAST TECHNIQUE: Multidetector CT imaging of the chest was performed following the standard protocol without IV contrast. COMPARISON:  Chest CT 08/23/2017. FINDINGS: Cardiovascular: Heart size is mildly enlarged. There is no significant pericardial fluid, thickening or pericardial calcification. There is aortic atherosclerosis, as well as atherosclerosis of the great vessels of the mediastinum and the coronary arteries, including calcified atherosclerotic plaque in the left main, left anterior descending, left circumflex and right coronary arteries. Mediastinum/Nodes: No pathologically enlarged mediastinal or hilar lymph nodes. Please note that accurate exclusion of hilar adenopathy is limited on noncontrast CT scans. Esophagus is unremarkable in appearance. No axillary lymphadenopathy. Lungs/Pleura: Multifocal asymmetrically distributed airspace consolidation throughout the lungs bilaterally, concerning for multilobar pneumonia. Previously noted right upper lobe pulmonary nodule is now largely obscured by adjacent airspace disease and is not readily identifiable. Moderate bilateral pleural effusions. Near complete atelectasis of the left lower lobe with some additional dependent atelectasis in the right lower lobe. Upper Abdomen: Aortic  atherosclerosis. Musculoskeletal: There are no aggressive appearing lytic or blastic lesions noted in the visualized portions of the skeleton. IMPRESSION: 1. Findings are most compatible with severe multilobar bronchopneumonia. 2. Moderate bilateral pleural effusions with areas of dependent atelectasis in the lower lobes of lungs bilaterally. 3. Cardiomegaly. 4. Aortic atherosclerosis, in addition to left main and 3 vessel coronary artery disease. 5. Previously treated right upper lobe pulmonary nodule is now obscured by overlying airspace disease. Attention on followup studies is recommended. Aortic Atherosclerosis (ICD10-I70.0). Electronically Signed   By: Vinnie Langton M.D.   On: 09/11/2017 11:06   Mr Brain Wo Contrast  Result Date: 09/13/2017 CLINICAL DATA:  Stroke follow-up.  Aphasia and right-sided weakness. EXAM: MRI HEAD WITHOUT CONTRAST TECHNIQUE: Multiplanar, multiecho pulse sequences of the brain and surrounding structures were obtained without intravenous contrast. COMPARISON:  Head CT 09/11/2017 FINDINGS: Brain: The midline structures are normal. There is diffusion restriction predominantly within the left precentral gyrus but also extending into the postcentral gyrus. Encephalomalacia is again noted at the right temporal pole. There is cytotoxic edema within the left frontal operculum. Scattered areas of white matter hyperintensity consistent with chronic microvascular disease. No hemorrhage. No age-advanced or lobar predominant atrophy. No chronic microhemorrhage or superficial siderosis. Vascular: Major intracranial arterial and venous sinus flow voids are preserved. Skull and upper cervical spine: The visualized skull base, calvarium, upper cervical spine and extracranial soft tissues are normal. Sinuses/Orbits: No fluid levels or advanced mucosal thickening. No mastoid or middle ear effusion. Normal orbits. IMPRESSION: 1. Acute ischemic infarct  within the left hemisphere, predominantly  located within the left precentral gyrus and frontal operculum, but also involving the left postcentral gyrus. 2. No hemorrhage or mass effect. 3. Findings of chronic microvascular disease. Electronically Signed   By: Ulyses Jarred M.D.   On: 09/13/2017 05:54   Dg Chest Port 1 View  Result Date: 09/13/2017 CLINICAL DATA:  83 year old female status post left MCA emergent large vessel occlusion and endovascular revascularization. EXAM: PORTABLE CHEST 1 VIEW COMPARISON:  Portable chest 09/11/2017 and earlier. FINDINGS: Portable AP semi upright view at 0614 hours. Stable endotracheal tube tip just below the level the clavicles. Enteric tube courses to the abdomen, tip not included. Large lung volumes with coarse bilateral pulmonary interstitial opacity bilateral perihilar opacity has mildly regressed since 09/11/2017. Stable confluent left lung base opacity with obscuration of the left hemidiaphragm. No pneumothorax. No definite right pleural effusion. Stable cardiac size and mediastinal contours. Calcified aortic atherosclerosis. IMPRESSION: 1.  Stable lines and tubes. 2. Continued left pleural effusion with associated left lung base collapse or consolidation. 3. Coarse bilateral pulmonary interstitial opacity but regression of indistinct perihilar opacity (perhaps perihilar pulmonary edema) since 09/11/2017. Electronically Signed   By: Genevie Ann M.D.   On: 09/13/2017 07:40   Dg Chest Port 1 View  Result Date: 09/12/2017 CLINICAL DATA:  Endotracheal and OG tube placements EXAM: PORTABLE CHEST 1 VIEW COMPARISON:  09/11/2017 FINDINGS: Interval placement of an endotracheal tube with tip measuring 4.5 cm above the carina. An enteric tube has been placed. The tip is off the field of view but is well below the left hemidiaphragm. Heart size is normal. Again, there is diffuse bilateral airspace disease with a mostly perihilar distribution. Probable left pleural effusion. No pneumothorax. Calcification of the aorta.  IMPRESSION: Appliances appear in satisfactory position. Again, there is diffuse bilateral pulmonary infiltration with small left pleural effusion. Changes likely represent multifocal pneumonia, ARDS, or edema. Electronically Signed   By: Lucienne Capers M.D.   On: 09/12/2017 00:20   Dg Chest Port 1 View  Result Date: 09/11/2017 CLINICAL DATA:  83 year old female with history of fever for 1 day. EXAM: PORTABLE CHEST 1 VIEW COMPARISON:  Chest x-ray 09/07/2017. FINDINGS: Decreasing lung volumes with worsening aeration throughout the mid to lower lungs bilaterally, with air bronchograms evident in the right lower lobe, concerning for progressive multilobar pneumonia. Small right and moderate left pleural effusions. Pulmonary vasculature is obscured. No pneumothorax. Cardiomegaly. The patient is rotated to the left on today's exam, resulting in distortion of the mediastinal contours and reduced diagnostic sensitivity and specificity for mediastinal pathology. Atherosclerosis in the thoracic aorta. IMPRESSION: 1. Findings are compatible with worsening severe multilobar pneumonia, most evident throughout the mid to lower lungs bilaterally. 2. Small right and moderate left parapneumonic pleural effusions. 3. Cardiomegaly. 4. Aortic atherosclerosis. Electronically Signed   By: Vinnie Langton M.D.   On: 09/11/2017 08:09   Ct Head Code Stroke Wo Contrast  Result Date: 09/11/2017 CLINICAL DATA:  Code stroke.  Right-sided weakness. EXAM: CT HEAD WITHOUT CONTRAST TECHNIQUE: Contiguous axial images were obtained from the base of the skull through the vertex without intravenous contrast. COMPARISON:  09/06/2017 CT of the head. FINDINGS: Brain: No evidence of acute infarction, hemorrhage, hydrocephalus, extra-axial collection or mass lesion/mass effect. Right anterior temporal lobe chronic encephalomalacia is unchanged. Foci of hypoattenuation predominantly in periventricular white matter is stable and compatible with  mild chronic microvascular ischemic changes. Mild diffuse brain parenchymal volume loss is stable. Vascular: Calcific atherosclerosis of carotid  siphons. Skull: Normal. Negative for fracture or focal lesion. Sinuses/Orbits: No acute finding. Other: None. ASPECTS [facility] Stroke Program Early CT Score) - Ganglionic level infarction (caudate, lentiform nuclei, internal capsule, insula, M1-M3 cortex): 7 - Supraganglionic infarction (M4-M6 cortex): 3 Total score (0-10 with 10 being normal): 10 IMPRESSION: 1. No acute intracranial abnormality identified. 2. Stable right anterior temporal lobe encephalomalacia, chronic microvascular ischemic changes, and parenchymal volume loss of the brain. 3. ASPECTS is 10 These results were called by telephone at the time of interpretation on 09/11/2017 at 8:04 pm to Dr. Lorraine Lax who verbally acknowledged these results. Electronically Signed   By: Kristine Garbe M.D.   On: 09/11/2017 20:04    Labs:  CBC: Recent Labs    09/10/17 0308 09/11/17 0549 09/12/17 0549 09/13/17 0334  WBC 8.9 18.6* 17.3* 10.8*  HGB 10.9* 12.5 10.0* 9.0*  HCT 32.7* 36.7 29.5* 27.1*  PLT 184 197 208 242    COAGS: Recent Labs    04/06/17 1906 04/06/17 2212 09/11/17 2010 09/13/17 0334  INR 1.51 1.59 1.22 1.17  APTT 34 38* 36  --     BMP: Recent Labs    09/10/17 0308 09/11/17 0549 09/12/17 0549 09/13/17 0334  NA 137 135 136 140  K 3.9 4.0 4.3 3.7  CL 108 104 106 108  CO2 23 22 22 25   GLUCOSE 82 119* 147* 80  BUN 19 20 24* 29*  CALCIUM 7.5* 7.5* 7.3* 7.7*  CREATININE 0.84 0.85 0.75 0.80  GFRNONAA >60 >60 >60 >60  GFRAA >60 >60 >60 >60    LIVER FUNCTION TESTS: Recent Labs    04/13/17 0839 09/06/17 1102 09/07/17 0347 09/08/17 0336  BILITOT 0.7 1.2 1.4* 1.2  AST 16 66* 110* 109*  ALT 26 34 48 58*  ALKPHOS 118* 105 86 109  PROT 6.8 5.1* 4.7* 5.3*  ALBUMIN 3.6 2.6* 2.2* 2.2*    Assessment and Plan: Left MCA occlusion s/p intervention achieving a TICI 3  reperfusion Patient has been extubated.   She is able to move right leg and arm, although fine motor not intact.  Attempts to communicate.  Groin intact.  IR available if needed.   Electronically Signed: Docia Barrier, PA 09/13/2017, 2:59 PM   I spent a total of 15 Minutes at the the patient's bedside AND on the patient's hospital floor or unit, greater than 50% of which was counseling/coordinating care for stroke

## 2017-09-13 NOTE — Progress Notes (Signed)
PT Cancellation Note  Patient Details Name: MILENA LIGGETT MRN: [id number] DOB: 1932-09-19   Cancelled Treatment:    Reason Eval/Treat Not Completed: Medical issues which prohibited therapy(pt with orders to remain in bed until extubated with possible extubation today. Will hold and check next date)   Dragon Thrush B Javien Tesch 09/13/2017, 11:11 AM Elwyn Reach, [place]

## 2017-09-14 ENCOUNTER — Inpatient Hospital Stay (HOSPITAL_COMMUNITY): Payer: Medicare Other

## 2017-09-14 LAB — CBC WITH DIFFERENTIAL/PLATELET
Basophils Absolute: 0 10*3/uL (ref 0.0–0.1)
Basophils Relative: 0 %
Eosinophils Absolute: 0 10*3/uL (ref 0.0–0.7)
Eosinophils Relative: 0 %
HEMATOCRIT: 29.9 % — AB (ref 36.0–46.0)
Hemoglobin: 9.8 g/dL — ABNORMAL LOW (ref 12.0–15.0)
LYMPHS ABS: 1.1 10*3/uL (ref 0.7–4.0)
LYMPHS PCT: 11 %
MCH: 30.5 pg (ref 26.0–34.0)
MCHC: 32.8 g/dL (ref 30.0–36.0)
MCV: 93.1 fL (ref 78.0–100.0)
MONO ABS: 0.7 10*3/uL (ref 0.1–1.0)
MONOS PCT: 7 %
NEUTROS ABS: 8.1 10*3/uL — AB (ref 1.7–7.7)
Neutrophils Relative %: 82 %
Platelets: 329 10*3/uL (ref 150–400)
RBC: 3.21 MIL/uL — ABNORMAL LOW (ref 3.87–5.11)
RDW: 15.4 % (ref 11.5–15.5)
WBC: 10 10*3/uL (ref 4.0–10.5)

## 2017-09-14 LAB — BASIC METABOLIC PANEL
Anion gap: 12 (ref 5–15)
BUN: 28 mg/dL — ABNORMAL HIGH (ref 6–20)
CALCIUM: 7.9 mg/dL — AB (ref 8.9–10.3)
CO2: 25 mmol/L (ref 22–32)
CREATININE: 0.74 mg/dL (ref 0.44–1.00)
Chloride: 105 mmol/L (ref 101–111)
GFR calc Af Amer: >60 mL/min (ref >60)
GLUCOSE: 66 mg/dL (ref 65–99)
Potassium: 3.4 mmol/L — ABNORMAL LOW (ref 3.5–5.1)
Sodium: 142 mmol/L (ref 135–145)

## 2017-09-14 LAB — CULTURE, RESPIRATORY: CULTURE: NO GROWTH

## 2017-09-14 LAB — GLUCOSE, CAPILLARY: GLUCOSE-CAPILLARY: 81 mg/dL (ref 65–99)

## 2017-09-14 LAB — HEPARIN LEVEL (UNFRACTIONATED): Heparin Unfractionated: 0.27 IU/mL — ABNORMAL LOW (ref 0.30–0.70)

## 2017-09-14 LAB — CULTURE, RESPIRATORY W GRAM STAIN

## 2017-09-14 LAB — PHOSPHORUS: PHOSPHORUS: 2.6 mg/dL (ref 2.5–4.6)

## 2017-09-14 LAB — MAGNESIUM: Magnesium: 2.1 mg/dL (ref 1.7–2.4)

## 2017-09-14 MED ORDER — HEPARIN (PORCINE) IN NACL 100-0.45 UNIT/ML-% IJ SOLN
900.0000 [IU]/h | INTRAMUSCULAR | Status: DC
  Administered 2017-09-14: 650 [IU]/h via INTRAVENOUS
  Administered 2017-09-15: 950 [IU]/h via INTRAVENOUS
  Filled 2017-09-14 (×4): qty 250

## 2017-09-14 MED ORDER — ASPIRIN 300 MG RE SUPP
300.0000 mg | Freq: Every day | RECTAL | Status: DC
  Administered 2017-09-14 – 2017-09-16 (×3): 300 mg via RECTAL
  Filled 2017-09-14 (×3): qty 1

## 2017-09-14 MED ORDER — OSMOLITE 1.2 CAL PO LIQD
1000.0000 mL | ORAL | Status: DC
  Administered 2017-09-14 – 2017-09-15 (×2): 1000 mL
  Filled 2017-09-14 (×8): qty 1000

## 2017-09-14 MED ORDER — POTASSIUM CHLORIDE 10 MEQ/100ML IV SOLN
10.0000 meq | INTRAVENOUS | Status: AC
  Administered 2017-09-14 (×2): 10 meq via INTRAVENOUS
  Filled 2017-09-14 (×2): qty 100

## 2017-09-14 MED ORDER — BISACODYL 10 MG RE SUPP
10.0000 mg | Freq: Every day | RECTAL | Status: DC | PRN
  Administered 2017-09-14: 10 mg via RECTAL
  Filled 2017-09-14: qty 1

## 2017-09-14 NOTE — Progress Notes (Signed)
PULMONARY / CRITICAL CARE MEDICINE   Name: Kayla Kent MRN: [id number] DOB: [birth year]    ADMISSION DATE:  09/06/2017 CONSULTATION DATE:  09/11/2017  REFERRING MD:  Lesia Sago    CHIEF COMPLAINT:  Respiratory failure  HISTORY OF PRESENT ILLNESS:   83 year old female history of on apixaban, seizure disorder, documented COPD, right upper lobe lung cancer status post radiation therapy who initially presented 09/06/17 for confusion and hematemesis after being found down.  EGD was performed 11/23 which was only notable for patchy mild inflammation thoughout the stomach, respumed gastritis with stable hemoglobin and no further episodes of bleeding.  In addition she was being treated for an ESBL UTI based on urinalysis showing many bacteria and prior cultures showing ESBL E. coli.  Urine cultures here subsequently grew greater than 100,000 Klebsiella pneumoniae.  She was also noted to have multifocal infiltrates on her CT chest concerning for pneumonia.  Infectious disease was consulted on 11/25 and recommended changing vancomycin to linezolid for presumed aspiration causing her pneumonia throughout her admission her oxygen requirement has progressively increased to requiring a nonrebreather today to maintain oxygen saturation at 90% for which we were consulted.  Intubated to undergo medical thrombectomy by interventional radiology for acute stroke.  Patient to be assessed today for possible extubation.  Care plan discussed with stroke neurology service.     PAST MEDICAL HISTORY :  She  has a past medical history of Allergic rhinitis, Anxiety, CKD (chronic kidney disease) stage 3, GFR 30-59 ml/min (HCC), Emphysema of lung (HCC), HLD (hyperlipidemia), HTN (hypertension), Intolerance of drug, Paroxysmal supraventricular tachycardia ([place]), PVD (peripheral vascular disease) ([place]), Seizure ([place]), Syncope and collapse, and Uterine prolapse without mention of vaginal wall prolapse.    PAST  SURGICAL HISTORY: She  has a past surgical history that includes corrective eye surgery; Cholecystectomy; Tonsillectomy; IVD removed; stress cardiolite (08/05/93); RF ablation PSVT; Total hip arthroplasty (Right, 08/29/2014); Open reduction internal fixation (orif) distal radial fracture (Right, 08/29/2014); Cardioversion (N/A, 11/05/2014); cataract surgery (08/2015); Intramedullary (im) nail intertrochanteric (Left, 11/21/2016); Esophagogastroduodenoscopy (N/A, 09/09/2017); Radiology with anesthesia (N/A, 09/11/2017); and IR PERCUTANEOUS ART THROMBECTOMY/INFUSION INTRACRANIAL INC DIAG ANGIO (09/11/2017).  Allergies  Allergen Reactions  . Chocolate Hives  . Codeine Other (See Comments)    Patient states she acts crazy  . Diazepam Other (See Comments)    Patient states she acts crazy.  . Fruit & Vegetable Daily [Nutritional Supplements] Hives and Swelling    peaches  . Iohexol Hives     Code: HIVES, Desc: pt gets 13 hr pre-meds, Onset Date: [id number]   . Latex Hives  . Peach [Prunus Persica] Hives  . Peanut-Containing Drug Products Hives and Swelling  . Penicillins Hives, Itching and Swelling    Has patient had a PCN reaction causing immediate rash, facial/tongue/throat swelling, SOB or lightheadedness with hypotension: Yes Has patient had a PCN reaction causing severe rash involving mucus membranes or skin necrosis: No Has patient had a PCN reaction that required hospitalization No Has patient had a PCN reaction occurring within the last 10 years: No If all of the above answers are "NO", then may proceed with Cephalosporin use.   . Strawberry Extract Hives  . Sulfonamide Derivatives Hives    nausea  . Wheat Shortness Of Breath    Shortness of breath  . Wheat Bran Shortness Of Breath  . Sulfamethoxazole Hives  . Crestor [Rosuvastatin Calcium] Rash  . Iodine Rash    No current facility-administered medications on file prior to encounter.  Current Outpatient Medications on File Prior  to Encounter  Medication Sig  . ALPRAZolam (XANAX) 0.25 MG tablet TAKE 1 TABLET BY MOUTH TWICE DAILY AS NEEDED  . amLODipine (NORVASC) 2.5 MG tablet TAKE ONE TABLET BY MOUTH ONCE DAILY  . atorvastatin (LIPITOR) 10 MG tablet TAKE ONE TABLET BY MOUTH ONCE DAILY AT 6 PM  . bismuth subsalicylate (PEPTO BISMOL) 262 MG/15ML suspension Take 30 mLs by mouth every 6 (six) hours as needed for indigestion.  Marland Kitchen DILT-XR 180 MG 24 hr capsule TAKE 1 CAPSULE BY MOUTH ONCE DAILY  . ELIQUIS 2.5 MG TABS tablet TAKE ONE TABLET BY MOUTH TWICE DAILY  . levETIRAcetam (KEPPRA) 250 MG tablet Take 1 tablet (250 mg total) by mouth 2 (two) times daily.  Marland Kitchen oxybutynin (DITROPAN XL) 10 MG 24 hr tablet Take 1 tablet (10 mg total) by mouth at bedtime.  . pantoprazole (PROTONIX) 40 MG tablet Take 1 tablet (40 mg total) by mouth daily.  . sodium chloride (OCEAN) 0.65 % SOLN nasal spray Place 1 spray into both nostrils as needed for congestion.  . traZODone (DESYREL) 50 MG tablet Take 50 mg by mouth at bedtime.   . ciprofloxacin (CIPRO) 250 MG tablet Take 1 tablet (250 mg total) by mouth 2 (two) times daily. (Patient not taking: Reported on 09/06/2017)  . ibuprofen (ADVIL,MOTRIN) 600 MG tablet Take 1 tablet (600 mg total) by mouth every 6 (six) hours as needed. (Patient not taking: Reported on 09/06/2017)  . methocarbamol (ROBAXIN) 500 MG tablet Take 1 tablet (500 mg total) by mouth at bedtime as needed for muscle spasms. (Patient not taking: Reported on 09/06/2017)  . traMADol (ULTRAM) 50 MG tablet Take 1-2 tablets (50-100 mg total) by mouth every 6 (six) hours as needed for moderate pain or severe pain. (Patient not taking: Reported on 09/06/2017)    VITAL SIGNS: BP (!) 147/47   Pulse 96   Temp 98.1 F (36.7 C) (Oral)   Resp 16   Ht 5\' 7"  (1.702 m)   Wt 48.6 kg (107 lb 2.3 oz)   SpO2 100%   BMI 16.78 kg/m   HEMODYNAMICS:    VENTILATOR SETTINGS:    INTAKE / OUTPUT: I/O last 3 completed shifts: In: 8315 [I.V.:181;  IV Piggyback:1600] Out: 4340 [VVOHY:0737]  PHYSICAL EXAMINATION: General: Awake and following commands. No distress observed. Neuro: eyes open.  Right upper extremity without motion.  Right lower extremity with some movement to command.  Both left upper and left lower extremity movement spontaneously.  Pupils equally reactive to light.  Right facial droop. HEENT:   No icterus. No JVD.  Tongue is midline upon exam. Cardiovascular: S1, S2.  Irregularly irregular rhythm.  Rate approximately 70 bpm.  No S3 gallop, murmur, or rub. Lungs: Diminished breath sounds at the bases.  Scattered rhonchi.  No wheezes, crackles.  Abdomen: Flat, nontender.  No muscle guarding.  No organomegaly. Musculoskeletal: No pitting edema, clubbing, cyanosis. Skin: No ecchymoses.  LABS:  BMET Recent Labs  Lab 09/12/17 0549 09/13/17 0334 09/14/17 0429  NA 136 140 142  K 4.3 3.7 3.4*  CL 106 108 105  CO2 22 25 25   BUN 24* 29* 28*  CREATININE 0.75 0.80 0.74  GLUCOSE 147* 80 66    Electrolytes Recent Labs  Lab 09/07/17 1620  09/08/17 0716  09/12/17 0549 09/13/17 0334 09/14/17 0429  CALCIUM 8.0*   < >  --    < > 7.3* 7.7* 7.9*  MG 1.7  --  2.0  --  1.8  --   --   PHOS  --   --   --   --  4.0  --   --    < > = values in this interval not displayed.    CBC Recent Labs  Lab 09/12/17 0549 09/13/17 0334 09/14/17 0429  WBC 17.3* 10.8* 10.0  HGB 10.0* 9.0* 9.8*  HCT 29.5* 27.1* 29.9*  PLT 208 242 329    Coag's Recent Labs  Lab 09/11/17 2010 09/13/17 0334  APTT 36  --   INR 1.22 1.17    Sepsis Markers Recent Labs  Lab 09/11/17 0831 09/11/17 1119 09/12/17 0549 09/13/17 0334  LATICACIDVEN 1.4 2.4*  --   --   PROCALCITON 2.17  --  1.90 1.31    ABG Recent Labs  Lab 09/11/17 1255 09/11/17 2359 09/13/17 0405  PHART 7.529* 7.350 7.495*  PCO2ART 31.7* 44.0 33.9  PO2ART 58.7* 79.2* 117*    Liver Enzymes Recent Labs  Lab 09/08/17 0336  AST 109*  ALT 58*  ALKPHOS 109  BILITOT  1.2  ALBUMIN 2.2*    Cardiac Enzymes No results for input(s): TROPONINI, PROBNP in the last 168 hours.  Glucose No results for input(s): GLUCAP in the last 168 hours.  Imaging No results found. MAURIANNA BENARD  ECHO COMPLETE WO IMAGING ENHANCING AGENT  Order# [id number]  Reading physician: Josue Hector, MD Ordering physician: Rise Patience, MD Study date: 04/08/17  Study Result   Result status: Final result                              *[facility]*                         [address], [place] [address]                            [phone]  ------------------------------------------------------------------- Transthoracic Echocardiography  Patient:    Kayla Kent, Kayla Kent MR #:       [id number] Study Date: 04/08/2017 Gender:     F Age:        53 Height:     170.2 cm Weight:     56.7 kg BSA:        1.63 m^2 Pt. Status: Room:       3E06C   ADMITTING    Linward Foster  REFERRING    Rise Patience  PERFORMING   [place], Inpatient  SONOGRAPHER  Mikki Santee  ATTENDING    Fatima Blank  cc:  ------------------------------------------------------------------- LV EF: 55% -   60%  ------------------------------------------------------------------- History:   PMH:  Elevated Troponin.  Risk factors:  Hypertension. Dyslipidemia.  ------------------------------------------------------------------- Study Conclusions  - Left ventricle: The cavity size was normal. Wall thickness was   normal. Systolic function was normal. The estimated ejection   fraction was in the range of 55% to 60%. Doppler parameters are   consistent with both elevated ventricular end-diastolic filling   pressure and elevated left atrial filling pressure. - Aortic valve: There was trivial  regurgitation. - Mitral valve: There was mild regurgitation.  Valve area by   continuity equation (using LVOT flow): 1.22 cm^2. - Right atrium: The atrium was mildly dilated. - Atrial septum: There was a patent foramen ovale. - Pulmonary arteries: PA peak pressure: 49 mm Hg (S). - Pericardium, extracardiac: A trivial pericardial effusion was   identified.  ------------------------------------------------------------------- Study data:  Comparison was made to the study of 09/23/2014.  Study status:  Routine.  Procedure:  The patient reported no pain pre or post test. Transthoracic echocardiography. Image quality was adequate.  Study completion:  There were no complications. Transthoracic echocardiography.  M-mode, complete 2D, spectral Doppler, and color Doppler.  Birthdate:  Patient birthdate: 10-14-1932.  Age:  Patient is 83 yr old.  Sex:  Gender: female. BMI: 19.6 kg/m^2.  Blood pressure:     110/50  Patient status: Inpatient.  Study date:  Study date: 04/08/2017. Study time: 06:27 PM.  Location:  Bedside.  -------------------------------------------------------------------  ------------------------------------------------------------------- Left ventricle:  The cavity size was normal. Wall thickness was normal. Systolic function was normal. The estimated ejection fraction was in the range of 55% to 60%. Doppler parameters are consistent with both elevated ventricular end-diastolic filling pressure and elevated left atrial filling pressure.  ------------------------------------------------------------------- Aortic valve:   Trileaflet; mildly thickened, mildly calcified leaflets.  Doppler:   There was no stenosis.   There was trivial regurgitation.  ------------------------------------------------------------------- Aorta:  The aorta was normal, not dilated, and non-diseased.  ------------------------------------------------------------------- Mitral valve:   Mildly thickened leaflets .  Doppler:  There was mild regurgitation.     Valve area by continuity equation (using LVOT flow): 1.22 cm^2. Indexed valve area by continuity equation (using LVOT flow): 0.75 cm^2/m^2.    Mean gradient (D): 3 mm Hg.   ------------------------------------------------------------------- Left atrium:  The atrium was normal in size.  ------------------------------------------------------------------- Atrial septum:  There was a patent foramen ovale.  ------------------------------------------------------------------- Right ventricle:  The cavity size was normal. Wall thickness was normal. Systolic function was normal.  ------------------------------------------------------------------- Pulmonic valve:    Doppler:  There was mild regurgitation.  ------------------------------------------------------------------- Tricuspid valve:   Doppler:  There was mild regurgitation.  ------------------------------------------------------------------- Right atrium:  The atrium was mildly dilated.  ------------------------------------------------------------------- Pericardium:  A trivial pericardial effusion was identified.  ------------------------------------------------------------------- Systemic veins: Inferior vena cava: The vessel was dilated. The respirophasic diameter changes were blunted (< 50%), consistent with elevated central venous pressure.  ------------------------------------------------------------------- Post procedure conclusions Ascending Aorta:  - The aorta was normal, not dilated, and non-diseased.  ------------------------------------------------------------------- Measurements   Left ventricle                           Value          Reference  LV ID, ED, PLAX chordal          (L)     41    mm       43 - 52  LV ID, ES, PLAX chordal                  28    mm       23 - 38  LV fx shortening, PLAX chordal           32    %        >=29  LV PW thickness, ED                      7  mm        ----------  IVS/LV PW ratio, ED                      1.14           <=1.3  Stroke volume, 2D                        41    ml       ----------  Stroke volume/bsa, 2D                    25    ml/m^2   ----------  LV e&', lateral                           8.92  cm/s     ----------  LV e&', medial                            10.2  cm/s     ----------  LV e&', average                           9.56  cm/s     ----------    Ventricular septum                       Value          Reference  IVS thickness, ED                        8     mm       ----------    LVOT                                     Value          Reference  LVOT ID, S                               16    mm       ----------  LVOT area                                2.01  cm^2     ----------  LVOT peak velocity, S                    103   cm/s     ----------  LVOT mean velocity, S                    66.8  cm/s     ----------  LVOT VTI, S                              20.2  cm       ----------  LVOT peak gradient, S                    4     mm Hg    ----------    Aortic valve  Value          Reference  Aortic regurg pressure half-time         872   ms       ----------    Aorta                                    Value          Reference  Aortic root ID, ED                       28    mm       ----------    Left atrium                              Value          Reference  LA ID, A-P, ES                           35    mm       ----------  LA ID/bsa, A-P                           2.15  cm/m^2   <=2.2  LA volume, S                             75.3  ml       ----------  LA volume/bsa, S                         46.2  ml/m^2   ----------  LA volume, ES, 1-p A4C                   65.9  ml       ----------  LA volume/bsa, ES, 1-p A4C               40.4  ml/m^2   ----------  LA volume, ES, 1-p A2C                   82.4  ml       ----------  LA volume/bsa, ES, 1-p A2C               50.5  ml/m^2   ----------     Mitral valve                             Value          Reference  Mitral mean velocity, D                  70.6  cm/s     ----------  Mitral deceleration time                 208   ms       150 - 230  Mitral mean gradient, D                  3     mm Hg    ----------  Mitral E/A ratio, peak  4.6            ----------  Mitral valve area, LVOT                  1.22  cm^2     ----------  continuity  Mitral valve area/bsa, LVOT              0.75  cm^2/m^2 ----------  continuity  Mitral annulus VTI, D                    33.2  cm       ----------    Pulmonary arteries                       Value          Reference  PA pressure, S, DP               (H)     49    mm Hg    <=30    Tricuspid valve                          Value          Reference  Tricuspid regurg peak velocity           321   cm/s     ----------  Tricuspid peak RV-RA gradient            41    mm Hg    ----------    Right atrium                             Value          Reference  RA ID, S-I, ES, A4C              (H)     60.1  mm       34 - 49  RA area, ES, A4C                 (H)     23.6  cm^2     8.3 - 19.5  RA volume, ES, A/L                       74.1  ml       ----------  RA volume/bsa, ES, A/L                   45.5  ml/m^2   ----------    Systemic veins                           Value          Reference  Estimated CVP                            8     mm Hg    ----------    Right ventricle                          Value          Reference  TAPSE  12.6  mm       ----------  RV pressure, S, DP               (H)     49    mm Hg    <=30  RV s&', lateral, S                        9.36  cm/s     ----------  Legend: (L)  and  (H)  mark values outside specified reference range.  ------------------------------------------------------------------- Prepared and Electronically Authenticated by  Jenkins Rouge, M.D. 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           Chest xray 11/27: CLINICAL DATA:  83 year old female status post left MCA emergent large vessel occlusion and endovascular revascularization.  EXAM: PORTABLE CHEST 1 VIEW  COMPARISON:  Portable chest 09/11/2017 and earlier.  FINDINGS: Portable AP semi upright view at 0614 hours. Stable endotracheal tube tip just below the level the clavicles. Enteric tube courses to the abdomen, tip not included.  Large lung volumes with coarse bilateral pulmonary interstitial opacity bilateral perihilar opacity has mildly regressed since 09/11/2017. Stable confluent left lung base opacity with obscuration of the left hemidiaphragm. No pneumothorax. No definite right pleural effusion.  Stable cardiac size and mediastinal contours. Calcified aortic atherosclerosis.  IMPRESSION: 1.  Stable lines and tubes. 2. Continued left pleural effusion with associated left lung base collapse or consolidation. 3. Coarse bilateral pulmonary interstitial opacity but regression of indistinct perihilar opacity (perhaps perihilar pulmonary edema) since 09/11/2017.   Electronically Signed   By: Genevie Ann M.D.   On: 09/13/2017 07:40   CULTURES: Respiratory viral panel negative Urine culture: > No growth on November 26.  09/11/17 blood culture pending MRSA screen negative    ANTIBIOTICS: cipro  11/20-11/21 Meropenem 11/21>>> Vancomycin 11/22-11/24 linezolid 11/25>>   SIGNIFICANT EVENTS: Status post thrombectomy of the left MCA.  LINES/TUBES: Peripherals  DISCUSSION: 83 year old female developed a aphasia and right hemiplegia yesterday requiring medical thrombectomy.  Patient intubated.  ASSESSMENT / PLAN:  PULMONARY A: 1.  Acute respiratory failure with hypoxemia; improved with diuresis.  Status post extubation. 2.  COPD, undocumented 3.  Pleural effusions 4.  Aspiration with pneumonia P:   1.  Maintain oxygen saturation of at least 90%. 2.  As needed bronchodilators.  This will help minimize the adverse cardiovascular effects of albuterol.  COPD not documented at this time. 3.  Appreciate infectious disease input.  Antibiotics will be stopped after today.  CARDIOVASCULAR A:  Atrial fibrillation. Probable diastolic congestive heart failure P:  1.  Being evaluated by cardiology.  Patient not a current candidate for anticoagulation.  It will resume in a few days. 2.  Cardizem being held at this time as ventricular rate is controlled and blood pressure is controlled. 3.  Had  a net negative fluid balance yesterday of -2.1 L.  Therefore she will not receive diuretics today. 4.  Follow-up chest x-ray on November 29  RENAL A: No issues.  P:   No current plan.  GASTROINTESTINAL A: Gastritis, status post EGD  P: 1.  Continue Protonix.  Continue to monitor the hemoglobin. 2.  Modified barium swallow today to assess for aspiration.  HEMATOLOGIC A: No active issues   INFECTIOUS A:   Aspiration pneumonia  P:   -Antibiotics to be stopped today.   ENDOCRINE A: No active issues  NEUROLOGIC A: Acute stroke  P:   1.  Status post medical thrombectomy. 2.  Not a candidate for anticoagulation at this time given recent history of GI bleeding.  Cardiology recommends Eliquis when approved by neurology.  FAMILY  - Updates: No family at the bedside  -  Inter-disciplinary family meet or Palliative Care meeting due by: day 7 CRITICAL CARE Performed by: Jesus Genera   Total critical care time: 30 minutes  Critical care time was exclusive of separately billable procedures and treating other patients.  Critical care was necessary to treat or prevent imminent or life-threatening deterioration.  Critical care was time spent personally by me on the following activities: development of treatment plan with patient and/or surrogate as well as nursing, discussions with consultants, evaluation of patient's response to treatment, examination of patient, obtaining history from patient or surrogate, ordering and performing treatments and interventions, ordering and review of laboratory studies, ordering and review of radiographic studies, pulse oximetry and re-evaluation of patient's condition.   Salley Scarlet, M.D. Pulmonary and [place] Pager: [phone]  Critical care time spent independently equals 35 minutes.  09/14/2017, 10:07 AM

## 2017-09-14 NOTE — Evaluation (Signed)
Physical Therapy Evaluation Patient Details Name: Kayla Kent MRN: [id number] DOB: 1931-10-19 Today's Date: 09/14/2017   History of Present Illness  Pt is an 83 y.o. female who presented to the ED with fall at home, blood in stool, red emesis, and confusion. Pt underwent EGD and colonoscopy on 09/09/2017. Neurologic status change 11/25 and pt underwent revascularization procedure of L MCA. Extubated 11/27. MRI + acute ischemic infarct L hemisphere - L precentral gyrus and frontal operculum and L postcentral gyrus. PMH significant for anxiety, CKD, emphysema of lung, HLD, HTN, intolerance of drugs, paroxysmal supraventricular tachycardia, PVD, and seizure.   Clinical Impression  Daughter present at the end of re-evaluation, prior to admission pt lived alone and was independent with all aspects of mobility. Pt now presents with significant functional status change s/p CVA. Patient requires increased physical assist (max +2) with stand pivot transfers as indicated below. VSS throughout session. Good participation. Pt will benefit from aggressive rehab at [facility] but will likely need 24/7 assistance afterward. Recommend CIR consult at this time. Will follow acutely, frequency increased for optimal neurologic recovery.    Follow Up Recommendations CIR;Supervision/Assistance - 24 hour    Equipment Recommendations  (TBD)    Recommendations for Other Services Rehab consult     Precautions / Restrictions Precautions Precautions: Fall Restrictions Weight Bearing Restrictions: No      Mobility  Bed Mobility Overal bed mobility: Needs Assistance Bed Mobility: Rolling;Sidelying to Sit Rolling: Max assist Sidelying to sit: Max assist       General bed mobility comments: VCs for technique to roll to right side, assist to place left hand on rail and assist to power up to sitting position at EOB.   Transfers Overall transfer level: Needs assistance   Transfers: Sit to/from Stand;Stand Pivot  Transfers Sit to Stand: Max assist Stand pivot transfers: Max assist;+2 physical assistance       General transfer comment: face to face transfer performed x2 during session  Ambulation/Gait                Stairs            Wheelchair Mobility    Modified Rankin (Stroke Patients Only)       Balance Overall balance assessment: Needs assistance   Sitting balance-Leahy Scale: Poor Sitting balance - Comments: R bias and posterior lean Postural control: Posterior lean;Right lateral lean   Standing balance-Leahy Scale: Poor Standing balance comment: forward head; unable to achieve upright midlien control; improves with VC                             Pertinent Vitals/Pain Pain Assessment: Faces Faces Pain Scale: No hurt    Home Living Family/patient expects to be discharged to:: Inpatient rehab Living Arrangements: Alone Available Help at Discharge: Family Type of Home: House Home Access: Stairs to enter     Home Layout: One level Home Equipment: Grab bars - tub/shower;Shower seat;Walker - 2 wheels Additional Comments: Unable to obtain further home set-up information as pt confused and agitated.     Prior Function Level of Independence: Independent         Comments: Reports independence with ADL and home management. Does not use RW per her report.      Hand Dominance   Dominant Hand: Right    Extremity/Trunk Assessment   Upper Extremity Assessment Upper Extremity Assessment: RUE deficits/detail RUE Deficits / Details: Pt able to initiate movement of RUE -  greater movement proximally. Able to extend elbow; unable to lift hand off bed to touch chest; no hand movement; Brunstrom stage I hand and II arm.  RUE Sensation: decreased light touch;decreased proprioception RUE Coordination: decreased fine motor;decreased gross motor    Lower Extremity Assessment Lower Extremity Assessment: RLE deficits/detail RLE Deficits / Details:  assymetrical weakness noted, able to elevate LE against gravity partial range 3-/5, dorsiflexion 3/5 RLE Sensation: decreased light touch RLE Coordination: decreased fine motor;decreased gross motor LLE Deficits / Details: generalized weakness    Cervical / Trunk Assessment Cervical / Trunk Assessment: Kyphotic;Other exceptions(R bias)  Communication   Communication: No difficulties  Cognition Arousal/Alertness: Awake/alert Behavior During Therapy: Flat affect Overall Cognitive Status: Impaired/Different from baseline Area of Impairment: Attention;Orientation;Following commands;Safety/judgement;Problem solving;Awareness;Memory                 Orientation Level: Disoriented to;Time(pt answered yes to being at [facility]) Current Attention Level: Sustained   Following Commands: Follows one step commands with increased time Safety/Judgement: Decreased awareness of safety;Decreased awareness of deficits Awareness: Intellectual Problem Solving: Slow processing;Difficulty sequencing;Requires verbal cues General Comments: Pt responsive to commands; Trying to communicate with speech, but mostly unintelligible. Trying to answer yes/no questions by shaking head. apparent motor planning difficulty      General Comments      Exercises     Assessment/Plan    PT Assessment Patient needs continued PT services  PT Problem List Decreased strength;Decreased activity tolerance;Decreased balance;Decreased mobility;Decreased coordination;Decreased cognition;Decreased knowledge of use of DME;Decreased safety awareness;Cardiopulmonary status limiting activity;Decreased knowledge of precautions       PT Treatment Interventions DME instruction;Gait training;Stair training;Functional mobility training;Therapeutic activities;Therapeutic exercise;Balance training;Neuromuscular re-education;Cognitive remediation;Patient/family education    PT Goals (Current goals can be found in the Care Plan  section)  Acute Rehab PT Goals Patient Stated Goal: per daughter to be independent again PT Goal Formulation: Patient unable to participate in goal setting Potential to Achieve Goals: Fair    Frequency Min 3X/week   Barriers to discharge Decreased caregiver support      Co-evaluation PT/OT/SLP Co-Evaluation/Treatment: Yes Reason for Co-Treatment: For patient/therapist safety;To address functional/ADL transfers PT goals addressed during session: Mobility/safety with mobility OT goals addressed during session: ADL's and self-care       AM-PAC PT "6 Clicks" Daily Activity  Outcome Measure Difficulty turning over in bed (including adjusting bedclothes, sheets and blankets)?: Unable Difficulty moving from lying on back to sitting on the side of the bed? : Unable Difficulty sitting down on and standing up from a chair with arms (e.g., wheelchair, bedside commode, etc,.)?: Unable Help needed moving to and from a bed to chair (including a wheelchair)?: Total Help needed walking in hospital room?: Total Help needed climbing 3-5 steps with a railing? : Total 6 Click Score: 6    End of Session Equipment Utilized During Treatment: Oxygen Activity Tolerance: Patient limited by fatigue Patient left: in chair;with call bell/phone within reach;with chair alarm set;with family/visitor present Nurse Communication: Mobility status PT Visit Diagnosis: Other abnormalities of gait and mobility (R26.89);Muscle weakness (generalized) (M62.81)    Time: 4970-2637 PT Time Calculation (min) (ACUTE ONLY): 36 min   Charges:   PT Evaluation $PT Re-evaluation: 1 Re-eval     PT G Codes:        Alben Deeds, PT DPT  Board Certified Neurologic Specialist [place] 09/14/2017, 4:45 PM

## 2017-09-14 NOTE — Evaluation (Signed)
Clinical/Bedside Swallow Evaluation Patient Details  Name: Kayla Kent MRN: [id number] Date of Birth: [birth year]  Today's Date: 09/14/2017 Time: SLP Start Time (ACUTE ONLY): 0827 SLP Stop Time (ACUTE ONLY): 0858 SLP Time Calculation (min) (ACUTE ONLY): 31 min  Past Medical History:  Past Medical History:  Diagnosis Date  . Allergic rhinitis   . Anxiety   . CKD (chronic kidney disease) stage 3, GFR 30-59 ml/min (HCC)   . Emphysema of lung ([place])   . HLD (hyperlipidemia)   . HTN (hypertension)   . Intolerance of drug    orthostatic  . Paroxysmal supraventricular tachycardia ([place])   . PVD (peripheral vascular disease) ([place])   . Seizure ([place])   . Syncope and collapse   . Uterine prolapse without mention of vaginal wall prolapse    Past Surgical History:  Past Surgical History:  Procedure Laterality Date  . CARDIOVERSION N/A 11/05/2014   Procedure: CARDIOVERSION;  Surgeon: Candee Furbish, MD;  Location: [facility] ENDOSCOPY;  Service: Cardiovascular;  Laterality: N/A;  . cataract surgery  08/2015  . CHOLECYSTECTOMY    . corrective eye surgery     as a child  . ESOPHAGOGASTRODUODENOSCOPY N/A 09/09/2017   Procedure: ESOPHAGOGASTRODUODENOSCOPY (EGD);  Surgeon: Mauri Pole, MD;  Location: [facility] ENDOSCOPY;  Service: Endoscopy;  Laterality: N/A;  . INTRAMEDULLARY (IM) NAIL INTERTROCHANTERIC Left 11/21/2016   Procedure: INTRAMEDULLARY (IM) NAIL INTERTROCHANTRIC HEMI;  Surgeon: Newt Minion, MD;  Location: [place];  Service: Orthopedics;  Laterality: Left;  . IR PERCUTANEOUS ART THROMBECTOMY/INFUSION INTRACRANIAL INC DIAG ANGIO  09/11/2017  . IVD removed    . OPEN REDUCTION INTERNAL FIXATION (ORIF) DISTAL RADIAL FRACTURE Right 08/29/2014   Procedure: OPEN REDUCTION INTERNAL FIXATION (ORIF) DISTAL RADIAL FRACTURE;  Surgeon: Marianna Payment, MD;  Location: [place];  Service: Orthopedics;  Laterality: Right;  . RADIOLOGY WITH ANESTHESIA N/A 09/11/2017   Procedure: RADIOLOGY WITH ANESTHESIA;   Surgeon: Luanne Bras, MD;  Location: [place];  Service: Radiology;  Laterality: N/A;  . RF ablation PSVT     summer '10  . stress cardiolite  08/05/93  . TONSILLECTOMY    . TOTAL HIP ARTHROPLASTY Right 08/29/2014   Procedure: Right Hip Hemi Arthroplasty;  Surgeon: Marianna Payment, MD;  Location: [place];  Service: Orthopedics;  Laterality: Right;  Hip procedure 1st wants [employer], [employer], [place].    HPI:  Kayla Kent a 83 y.o.femalewith medical history significant forstage III chronic kidney disease, dyslipidemia, chronic atrial fibrillation on Eliquis, hypertension, seizure disorder on AEDs, and recent FNA of thyroid nodule. Initial admit for upper gi bleed but also having progress oxygen needs found to have multifocal pneumonia thought to be due to aspiration pneumonia in the setting of newaphasia and right hemiplegia yesterday requiringmedicalthrombectomy. MRI Brain 11/27 shows acute ischemic infarct within the left hemisphere, predominantly located within the left precentral gyrus and frontal operculum, also involving the left postcentral gyrus. Pt intubated for thrombectomy on 11/25 - extubated on 11/27.   Assessment / Plan / Recommendation Clinical Impression  Pt demonstrates s/sx suggestive of oropharhyngeal dysphagia marked by oral holding of ice chips and immediate cough/choking with applesauce requiring suction of bolus. Pt recently extubated yesterday post 2-day intubation and has observed right-sided weakness, general oral weakness and lingual discoordination. Pt is able to follow commands and demonstrates sensation of bolus in pharynx; will follow-up with MBS to objectively assess safety of pt's swallow function. Diet recommendation pending MBS results.   SLP Visit Diagnosis: Dysphagia, oropharyngeal  phase (R13.12)    Aspiration Risk  Moderate aspiration risk    Diet Recommendation NPO        Other  Recommendations Oral Care Recommendations: Oral  care QID   Follow up Recommendations Other (comment)(TBD)      Frequency and Duration min 2x/week  2 weeks       Prognosis Prognosis for Safe Diet Advancement: Fair Barriers to Reach Goals: Cognitive deficits;Severity of deficits      Swallow Study   General HPI: Kayla Kent a 83 y.o.femalewith medical history significant forstage III chronic kidney disease, dyslipidemia, chronic atrial fibrillation on Eliquis, hypertension, seizure disorder on AEDs, and recent FNA of thyroid nodule. Initial admit for upper gi bleed but also having progress oxygen needs found to have multifocal pneumonia thought to be due to aspiration pneumonia in the setting of newaphasia and right hemiplegia yesterday requiringmedicalthrombectomy. MRI Brain 11/27 shows acute ischemic infarct within the left hemisphere, predominantly located within the left precentral gyrus and frontal operculum, also involving the left postcentral gyrus. Pt intubated for thrombectomy on 11/25 - extubated on 11/27. Type of Study: Bedside Swallow Evaluation Diet Prior to this Study: NPO Temperature Spikes Noted: No Respiratory Status: Nasal cannula History of Recent Intubation: Yes Length of Intubations (days): 3 days Date extubated: 09/13/17 Behavior/Cognition: Alert;Cooperative;Requires cueing Oral Cavity Assessment: Within Functional Limits Oral Care Completed by SLP: No Oral Cavity - Dentition: Adequate natural dentition Self-Feeding Abilities: Needs assist Patient Positioning: Upright in bed Baseline Vocal Quality: Low vocal intensity    Oral/Motor/Sensory Function Overall Oral Motor/Sensory Function: Moderate impairment Facial ROM: Reduced right;Suspected CN VII (facial) dysfunction Facial Symmetry: Abnormal symmetry right;Suspected CN VII (facial) dysfunction Facial Strength: Reduced right;Suspected CN VII (facial) dysfunction Lingual ROM: Suspected CN XII (hypoglossal) dysfunction Lingual Strength:  Reduced;Suspected CN XII (hypoglossal) dysfunction Mandible: Within Functional Limits   Ice Chips Ice chips: Impaired Presentation: Spoon Oral Phase Impairments: Poor awareness of bolus Oral Phase Functional Implications: Oral holding   Thin Liquid Thin Liquid: Not tested    Nectar Thick Nectar Thick Liquid: Not tested   Honey Thick Honey Thick Liquid: Not tested   Puree Puree: Impaired Presentation: Spoon Oral Phase Functional Implications: Prolonged oral transit Pharyngeal Phase Impairments: Cough - Immediate   Solid   GO   Solid: Not tested        Aaron Edelman, Student SLP 09/14/2017,9:34 AM

## 2017-09-14 NOTE — Progress Notes (Signed)
Occupational Therapy Evaluation Patient Details Name: Kayla Kent MRN: [id number] DOB: [birth year] Today's Date: 09/14/2017    History of Present Illness Pt is an 83 y.o. female who presented to the ED with fall at home, blood in stool, red emesis, and confusion. Pt underwent EGD and colonoscopy on 09/09/2017. Neurologic status change 11/25 and pt underwent revascularization procedure of L MCA. Extubated 11/27. MRI + acute ischemic infarct L hemisphere - L precentral gyrus and frontal operculum and L postcentral gyrus. PMH significant for anxiety, CKD, emphysema of lung, HLD, HTN, intolerance of drugs, paroxysmal supraventricular tachycardia, PVD, and seizure.    Clinical Impression   PTA, pt lived alone and was independent with ADL, mobility, IADL tasks, including driving. Pt presents with significant functional status change requiring MAx A with stand pivot transfers and Max to total A with ADL tasks de to deficits listed below. VSS throughout session. Good participation. Pt will benefit from aggressive rehab at [facility] but will likely need 24/7 assistance afterward. If family not able to provide this level of assistance, pt will need rehab at [facility]. Will follow acutely to address established goals and facilitate DC to next venue of care.     Follow Up Recommendations  Supervision/Assistance - 24 hour;CIR    Equipment Recommendations  Other (comment)(TBD at next venue)    Recommendations for Other Services Rehab consult     Precautions / Restrictions Precautions Precautions: Fall Restrictions Weight Bearing Restrictions: No      Mobility Bed Mobility Overal bed mobility: Needs Assistance Bed Mobility: Rolling;Sidelying to Sit Rolling: Max assist Sidelying to sit: Max assist          Transfers Overall transfer level: Needs assistance   Transfers: Sit to/from Stand;Stand Pivot Transfers Sit to Stand: Max assist Stand pivot transfers: Max assist;+2 physical assistance             Balance Overall balance assessment: Needs assistance   Sitting balance-Leahy Scale: Poor Sitting balance - Comments: R bias and posterior lean Postural control: Posterior lean;Right lateral lean   Standing balance-Leahy Scale: Poor Standing balance comment: forward head; unable to achieve upright midlien control; improves with VC                           ADL either performed or assessed with clinical judgement   ADL Overall ADL's : Needs assistance/impaired Eating/Feeding: NPO Eating/Feeding Details (indicate cue type and reason): coretrack placed 11/28 Grooming: Moderate assistance;Sitting Grooming Details (indicate cue type and reason): able to wash face and bring comb to hair (although using wrong side of comb); assisting with suctioning mouth Upper Body Bathing: Moderate assistance;Bed level   Lower Body Bathing: Total assistance;Bed level   Upper Body Dressing : Total assistance;Sitting   Lower Body Dressing: Total assistance;Bed level   Toilet Transfer: Maximal assistance;+2 for safety/equipment;BSC;Stand-pivot   Toileting- Clothing Manipulation and Hygiene: Total assistance;Sit to/from stand;+2 for physical assistance       Functional mobility during ADLs: Maximal assistance;+2 for physical assistance       Vision Baseline Vision/History: Wears glasses Wears Glasses: Reading only Patient Visual Report: (reports no changes) Vision Assessment?: Yes Eye Alignment: Impaired (comment)(R eye laterally rotated) Ocular Range of Motion: Impaired-to be further tested in functional context Alignment/Gaze Preference: Gaze left Tracking/Visual Pursuits: Impaired - to be further tested in functional context(R eye with difficulty tracking) Visual Fields: Impaired-to be further tested in functional context(? 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 R inattention   Praxis Praxis Praxis tested?: Deficits Praxis-Other Comments: deficits with motor planning; using objects purposely    Pertinent Vitals/Pain Pain Assessment: Faces Faces Pain Scale: No hurt     Hand Dominance Right   Extremity/Trunk Assessment Upper Extremity Assessment Upper Extremity Assessment: RUE deficits/detail RUE Deficits / Details: Pt able to initiate movement of RUE - greater movement proximally. Able to extend elbow; unable to lift hand off bed to touch chest; no hand movement; Brunstrom stage I hand and II arm.  RUE Sensation: decreased light touch;decreased proprioception RUE Coordination: decreased fine motor;decreased gross motor   Lower Extremity Assessment Lower Extremity Assessment: Defer to PT evaluation   Cervical / Trunk Assessment Cervical / Trunk Assessment: Kyphotic;Other exceptions(R bias); forward head   Communication Communication Communication: No difficulties   Cognition Arousal/Alertness: Awake/alert Behavior During Therapy: Flat affect Overall Cognitive Status: Impaired/Different from baseline Area of Impairment: Attention;Orientation;Following commands;Safety/judgement;Problem solving;Awareness;Memory                 Orientation Level: Disoriented to;Time(pt answered yes to being at [facility]) Current Attention Level: Sustained   Following Commands: Follows one step commands with increased time Safety/Judgement: Decreased awareness of safety;Decreased awareness of deficits Awareness: Intellectual Problem Solving: Slow processing;Difficulty sequencing;Requires verbal cues General Comments: Pt responsive to commands; Trying to communicate with speech, but mostly unintelligible. Trying to answer yes/no questions by shaking head. apparent motor planning difficulty   General Comments       Exercises     Shoulder Instructions      Home Living Family/patient  expects to be discharged to:: Inpatient rehab Living Arrangements: Alone Available Help at Discharge: Family Type of Home: House Home Access: Stairs to enter     Home Layout: One level     Bathroom Shower/Tub: Occupational psychologist: Handicapped height Bathroom Accessibility: Yes   Home Equipment: Grab bars - tub/shower;Shower seat;Walker - 2 wheels   Additional Comments: Unable to obtain further home set-up information as pt confused and agitated.       Prior Functioning/Environment Level of Independence: Independent        Comments: Reports independence with ADL and home management. Does not use RW per her report.         OT Problem List: Decreased strength;Decreased activity tolerance;Impaired balance (sitting and/or standing);Decreased cognition;Decreased safety awareness;Decreased knowledge of use of DME or AE;Decreased knowledge of precautions;Cardiopulmonary status limiting activity;Decreased range of motion;Impaired vision/perception;Decreased coordination;Impaired sensation;Impaired tone;Impaired UE functional use      OT Treatment/Interventions: Self-care/ADL training;Therapeutic exercise;Energy conservation;DME and/or AE instruction;Therapeutic activities;Cognitive remediation/compensation;Patient/family education;Balance training;Neuromuscular education;Visual/perceptual remediation/compensation    OT Goals(Current goals can be found in the care plan section) Acute Rehab OT Goals Patient Stated Goal: per daughter to be independent again OT Goal Formulation: With patient/family Time For Goal Achievement: 09/28/17 Potential to Achieve Goals: Good  OT Frequency: Min 2X/week   Barriers to D/C:            Co-evaluation PT/OT/SLP Co-Evaluation/Treatment: Yes Reason for Co-Treatment: For patient/therapist safety;To address functional/ADL transfers   OT goals addressed during session: ADL's and self-care      AM-PAC PT "6 Clicks" Daily Activity      Outcome Measure Help from another person eating meals?: Total Help from another person taking care of personal grooming?: A Lot Help from another person toileting, which includes  using toliet, bedpan, or urinal?: Total Help from another person bathing (including washing, rinsing, drying)?: A Lot Help from another person to put on and taking off regular upper body clothing?: Total Help from another person to put on and taking off regular lower body clothing?: Total 6 Click Score: 8   End of Session Equipment Utilized During Treatment: Oxygen Nurse Communication: Mobility status  Activity Tolerance: Patient tolerated treatment well Patient left: with call bell/phone within reach;in chair;with chair alarm set;with family/visitor present;with SCD's reapplied  OT Visit Diagnosis: Other abnormalities of gait and mobility (R26.89);Muscle weakness (generalized) (M62.81);Other symptoms and signs involving cognitive function;Apraxia (R48.2);Feeding difficulties (R63.3);Hemiplegia and hemiparesis Hemiplegia - Right/Left: Right Hemiplegia - dominant/non-dominant: Dominant Hemiplegia - caused by: Cerebral infarction                Time: 1350-1428 OT Time Calculation (min): 38 min Charges:  OT General Charges $OT Visit: 1 Visit OT Evaluation $OT Eval Moderate Complexity: 1 Mod OT Treatments $Self Care/Home Management : 8-22 mins G-Codes:     [facility], OT/L  [phone] 09/14/2017  Ketan Renz,HILLARY 09/14/2017, 3:31 PM

## 2017-09-14 NOTE — Progress Notes (Addendum)
ANTICOAGULATION CONSULT NOTE - Initial Consult  Pharmacy Consult for heparin Indication: atrial fibrillation and stroke  Allergies  Allergen Reactions  . Chocolate Hives  . Codeine Other (See Comments)    Patient states she acts crazy  . Diazepam Other (See Comments)    Patient states she acts crazy.  . Fruit & Vegetable Daily [Nutritional Supplements] Hives and Swelling    peaches  . Iohexol Hives     Code: HIVES, Desc: pt gets 13 hr pre-meds, Onset Date: [id number]   . Latex Hives  . Peach [Prunus Persica] Hives  . Peanut-Containing Drug Products Hives and Swelling  . Penicillins Hives, Itching and Swelling    Has patient had a PCN reaction causing immediate rash, facial/tongue/throat swelling, SOB or lightheadedness with hypotension: Yes Has patient had a PCN reaction causing severe rash involving mucus membranes or skin necrosis: No Has patient had a PCN reaction that required hospitalization No Has patient had a PCN reaction occurring within the last 10 years: No If all of the above answers are "NO", then may proceed with Cephalosporin use.   . Strawberry Extract Hives  . Sulfonamide Derivatives Hives    nausea  . Wheat Shortness Of Breath    Shortness of breath  . Wheat Bran Shortness Of Breath  . Sulfamethoxazole Hives  . Crestor [Rosuvastatin Calcium] Rash  . Iodine Rash    Patient Measurements: Height: 5\' 7"  (170.2 cm) Weight: 107 lb 2.3 oz (48.6 kg) IBW/kg (Calculated) : 61.6  Vital Signs: Temp: 98.1 F (36.7 C) (11/28 0400) Temp Source: Oral (11/28 0400) BP: 163/69 (11/28 1100) Pulse Rate: 94 (11/28 1100)  Labs: Recent Labs    09/11/17 2010  09/12/17 0549 09/13/17 0334 09/14/17 0429  HGB  --    < > 10.0* 9.0* 9.8*  HCT  --   --  29.5* 27.1* 29.9*  PLT  --   --  208 242 329  APTT 36  --   --   --   --   LABPROT 15.3*  --   --  14.8  --   INR 1.22  --   --  1.17  --   CREATININE  --   --  0.75 0.80 0.74   < > = values in this interval not  displayed.    Estimated Creatinine Clearance: 40.9 mL/min (by C-G formula based on SCr of 0.74 mg/dL).   Medical History: Past Medical History:  Diagnosis Date  . Allergic rhinitis   . Anxiety   . CKD (chronic kidney disease) stage 3, GFR 30-59 ml/min (HCC)   . Emphysema of lung ([place])   . HLD (hyperlipidemia)   . HTN (hypertension)   . Intolerance of drug    orthostatic  . Paroxysmal supraventricular tachycardia ([place])   . PVD (peripheral vascular disease) ([place])   . Seizure ([place])   . Syncope and collapse   . Uterine prolapse without mention of vaginal wall prolapse     Medications:  Infusions:  . sodium chloride    . heparin    . niCARDipine Stopped (09/12/17 2322)  . potassium chloride 10 mEq (09/14/17 1103)  . propofol (DIPRIVAN) infusion Stopped (09/13/17 0735)    Assessment: 40 YOF presented with GI bleed and aspiration pna, eliquis held d/t bleed, then found with R-sided weakness and aphasia >> L-MCA infarct 2/2 afib and no tPA given d/t GI bleed.  Failed swallow 11/28 and will need AC per neuro until eliquis can be administered.    Goal of  Therapy:  Heparin level 0.3-0.5 units/ml Monitor platelets by anticoagulation protocol: Yes   Plan:  Start heparin gtt 650 units/hr with no bolus Monitor daily CBC, heparin level Monitor 8 hour heparin level today at 2000  Bertis Ruddy M 09/14/2017,12:10 PM

## 2017-09-14 NOTE — Progress Notes (Signed)
Pt's spO2 continues to drop to 85%. RT placed patient on salter HFNC at 10lpm. Patient's spO2 increased to 91%. Pt does not appear to be in respiratory distress at this time. RN is aware. RT will continue to monitor.

## 2017-09-14 NOTE — Progress Notes (Signed)
ANTICOAGULATION CONSULT NOTE - Initial Consult  Pharmacy Consult for heparin Indication: atrial fibrillation and stroke  Allergies  Allergen Reactions  . Chocolate Hives  . Codeine Other (See Comments)    Patient states she acts crazy  . Diazepam Other (See Comments)    Patient states she acts crazy.  . Fruit & Vegetable Daily [Nutritional Supplements] Hives and Swelling    peaches  . Iohexol Hives     Code: HIVES, Desc: pt gets 13 hr pre-meds, Onset Date: [id number]   . Latex Hives  . Peach [Prunus Persica] Hives  . Peanut-Containing Drug Products Hives and Swelling  . Penicillins Hives, Itching and Swelling    Has patient had a PCN reaction causing immediate rash, facial/tongue/throat swelling, SOB or lightheadedness with hypotension: Yes Has patient had a PCN reaction causing severe rash involving mucus membranes or skin necrosis: No Has patient had a PCN reaction that required hospitalization No Has patient had a PCN reaction occurring within the last 10 years: No If all of the above answers are "NO", then may proceed with Cephalosporin use.   . Strawberry Extract Hives  . Sulfonamide Derivatives Hives    nausea  . Wheat Shortness Of Breath    Shortness of breath  . Wheat Bran Shortness Of Breath  . Sulfamethoxazole Hives  . Crestor [Rosuvastatin Calcium] Rash  . Iodine Rash    Patient Measurements: Height: 5\' 7"  (170.2 cm) Weight: 107 lb 2.3 oz (48.6 kg) IBW/kg (Calculated) : 61.6  Vital Signs: Temp: 97.9 F (36.6 C) (11/28 2000) Temp Source: Oral (11/28 2000) BP: 165/58 (11/28 1800) Pulse Rate: 28 (11/28 1800)  Labs: Recent Labs    09/12/17 0549 09/13/17 0334 09/14/17 0429 09/14/17 2017  HGB 10.0* 9.0* 9.8*  --   HCT 29.5* 27.1* 29.9*  --   PLT 208 242 329  --   LABPROT  --  14.8  --   --   INR  --  1.17  --   --   HEPARINUNFRC  --   --   --  0.27*  CREATININE 0.75 0.80 0.74  --     Estimated Creatinine Clearance: 40.9 mL/min (by C-G formula based  on SCr of 0.74 mg/dL).   Medical History: Past Medical History:  Diagnosis Date  . Allergic rhinitis   . Anxiety   . CKD (chronic kidney disease) stage 3, GFR 30-59 ml/min (HCC)   . Emphysema of lung ([place])   . HLD (hyperlipidemia)   . HTN (hypertension)   . Intolerance of drug    orthostatic  . Paroxysmal supraventricular tachycardia ([place])   . PVD (peripheral vascular disease) ([place])   . Seizure ([place])   . Syncope and collapse   . Uterine prolapse without mention of vaginal wall prolapse     Medications:  Infusions:  . sodium chloride    . feeding supplement (OSMOLITE 1.2 CAL) 1,000 mL (09/14/17 1700)  . heparin 650 Units/hr (09/14/17 1700)  . niCARDipine Stopped (09/12/17 2322)  . propofol (DIPRIVAN) infusion Stopped (09/13/17 0735)    Assessment: 20 YOF presented with GI bleed and aspiration pna, eliquis held d/t bleed, then found with R-sided weakness and aphasia >> L-MCA infarct 2/2 afib and no tPA given d/t GI bleed.  Failed swallow 11/28 and will need AC per neuro until eliquis can be administered.    Heparin infusion 650 units/hr was started at 3PM today with no bolus. Anti-Xa level is 0.27, slightly subtherapeutic, but drawn only 5 hrs after infusion was started.  Pt with low weight and high bleeding risk. Will not increase rate aggressively.   Goal of Therapy:  Heparin level 0.3-0.5 units/ml Monitor platelets by anticoagulation protocol: Yes   Plan:  Continue heparin gtt 650 units/hr, might take a few more hours to reach steady state.  F/u AM labs.  Maryanna Shape, PharmD, BCPS  Clinical Pharmacist  Pager: [phone]   09/14/2017,9:19 PM

## 2017-09-14 NOTE — Progress Notes (Signed)
OT Cancellation Note  Patient Details Name: SAN RUA MRN: [id number] DOB: 05/28/1932   Cancelled Treatment:    Reason Eval/Treat Not Completed: Other (comment). Discussed with nsg. Pt to have coretrack placed and needs to be in bed. Will return after procedure in order to progress pt OOB.   [place], OT/L  [phone] 09/14/2017 09/14/2017, 11:10 AM

## 2017-09-14 NOTE — Progress Notes (Signed)
Nutrition Follow-up  DOCUMENTATION CODES:   Underweight, Non-severe (moderate) malnutrition in context of chronic illness  INTERVENTION:   Start Osmolite 1.2 @ 20 ml/hr and increase by 10 ml every 12 hours to goal rate of 60 ml/hr   Monitor magnesium and phosphorus every 12 hours x 4 occurences, MD to replete as needed, as pt is at risk for refeeding syndrome given moderate malnutrition and poor intake during admission.  NUTRITION DIAGNOSIS:   Moderate Malnutrition related to chronic illness as evidenced by mild fat depletion, moderate fat depletion, mild muscle depletion, moderate muscle depletion, severe muscle depletion. Ongoing.   GOAL:   Patient will meet greater than or equal to 90% of their needs Progressing.   MONITOR:   TF tolerance, Diet advancement, Weight trends  REASON FOR ASSESSMENT:   Consult Enteral/tube feeding initiation and management  ASSESSMENT:   Pt with PMH of sz d/o, CKD stage 3, HLD, HTN, PVD, COPD, RUL lung ca s/p XRT admitted 11/20 with upper GI bleed (s/p EGD) and PNA. Pt developed new aphasia and R hemiplegia on 11/26 s/p thrombectomy.    Pt discussed during ICU rounds and with RN. Plan for transfer out of ICU today.  Noted multiple allergies: peanut, wheat, peach, strawberry  Per chart review pt with very minimal intake before CVA.   11/27 extubated (intubated 11/25-11/27) 11/28 failed swallow eval, Cortrak to be placed  Medications reviewed and include: senokot-s  Labs reviewed: K+ 3.4 (L), PO4 and magnesium were WNL 11/26   Diet Order:  Diet NPO time specified  EDUCATION NEEDS:   No education needs have been identified at this time  Skin:  Skin Assessment: Skin Integrity Issues: Skin Integrity Issues:: Stage I Stage I: bilateral sacrum  Last BM:  11/23  Height:   Ht Readings from Last 1 Encounters:  09/11/17 5\' 7"  (1.702 m)   Weight:   Wt Readings from Last 1 Encounters:  09/14/17 107 lb 2.3 oz (48.6 kg)   Ideal Body  Weight:  61.36 kg  BMI:  Body mass index is 16.78 kg/m.  Estimated Nutritional Needs:   Kcal:  1650-1850 (34-38 kcal/kg bw)  Protein:  67-76 g Pro (1.4-1.6 g/kg bw)  Fluid:  1.6-1.8 L fluid ( 57ml/kcal)  Maylon Peppers RD, LDN, CNSC [phone] Pager [phone] After Hours Pager

## 2017-09-14 NOTE — Evaluation (Signed)
Objective Swallowing Evaluation: Type of Study: MBS-Modified Barium Swallow Study   Patient Details  Name: Kayla Kent MRN: [id number] Date of Birth: June 22, 1932  Today's Date: 09/14/2017 Time: SLP Start Time (ACUTE ONLY): 1020 -SLP Stop Time (ACUTE ONLY): 1037  SLP Time Calculation (min) (ACUTE ONLY): 17 min   Past Medical History:  Past Medical History:  Diagnosis Date  . Allergic rhinitis   . Anxiety   . CKD (chronic kidney disease) stage 3, GFR 30-59 ml/min (HCC)   . Emphysema of lung ([place])   . HLD (hyperlipidemia)   . HTN (hypertension)   . Intolerance of drug    orthostatic  . Paroxysmal supraventricular tachycardia ([place])   . PVD (peripheral vascular disease) ([place])   . Seizure ([place])   . Syncope and collapse   . Uterine prolapse without mention of vaginal wall prolapse    Past Surgical History:  Past Surgical History:  Procedure Laterality Date  . CARDIOVERSION N/A 11/05/2014   Procedure: CARDIOVERSION;  Surgeon: Candee Furbish, MD;  Location: [facility] ENDOSCOPY;  Service: Cardiovascular;  Laterality: N/A;  . cataract surgery  08/2015  . CHOLECYSTECTOMY    . corrective eye surgery     as a child  . ESOPHAGOGASTRODUODENOSCOPY N/A 09/09/2017   Procedure: ESOPHAGOGASTRODUODENOSCOPY (EGD);  Surgeon: Mauri Pole, MD;  Location: [facility] [place] ENDOSCOPY;  Service: Endoscopy;  Laterality: N/A;  . INTRAMEDULLARY (IM) NAIL INTERTROCHANTERIC Left 11/21/2016   Procedure: INTRAMEDULLARY (IM) NAIL INTERTROCHANTRIC HEMI;  Surgeon: Newt Minion, MD;  Location: [place];  Service: Orthopedics;  Laterality: Left;  . IR PERCUTANEOUS ART THROMBECTOMY/INFUSION INTRACRANIAL INC DIAG ANGIO  09/11/2017  . IVD removed    . OPEN REDUCTION INTERNAL FIXATION (ORIF) DISTAL RADIAL FRACTURE Right 08/29/2014   Procedure: OPEN REDUCTION INTERNAL FIXATION (ORIF) DISTAL RADIAL FRACTURE;  Surgeon: Marianna Payment, MD;  Location: [place];  Service: Orthopedics;  Laterality: Right;  . RADIOLOGY WITH ANESTHESIA N/A  09/11/2017   Procedure: RADIOLOGY WITH ANESTHESIA;  Surgeon: Luanne Bras, MD;  Location: [place];  Service: Radiology;  Laterality: N/A;  . RF ablation PSVT     summer '10  . stress cardiolite  08/05/93  . TONSILLECTOMY    . TOTAL HIP ARTHROPLASTY Right 08/29/2014   Procedure: Right Hip Hemi Arthroplasty;  Surgeon: Marianna Payment, MD;  Location: [place];  Service: Orthopedics;  Laterality: Right;  Hip procedure 1st wants Peg Board, [employer], [place].    HPI: [name] a 83 y.o.femalewith medical history significant forstage III chronic kidney disease, dyslipidemia, chronic atrial fibrillation on Eliquis, hypertension, seizure disorder on AEDs, and recent FNA of thyroid nodule. Initial admit for upper gi bleed but also having progress oxygen needs found to have multifocal pneumonia thought to be due to aspiration pneumonia in the setting of newaphasia and right hemiplegia yesterday requiringmedicalthrombectomy. MRI Brain 11/27 shows acute ischemic infarct within the left hemisphere, predominantly located within the left precentral gyrus and frontal operculum, also involving the left postcentral gyrus. Pt intubated for thrombectomy on 11/25 - extubated on 11/27.   No Data Recorded   Assessment / Plan / Recommendation  CHL IP CLINICAL IMPRESSIONS 09/14/2017  Clinical Impression Pt presents with moderate-severe orophargneal dysphagia marked by oral holding, delayed swallow initiation to the pyriforms, moderate residue post swallow and aspiration during the swallow with thin and honey-thick liquid consistencies. Pt required MAX cueing and sensory feedback (large boluses/spoon bolus further back) for swallow initiation. Aspirate sensed by patient; however, pt unable to effectively cough to  clear aspirate from airway. Puree consistency transited without penetration/aspiration however, resulted in moderate pharyngeal residue. Pt unable to clear residue with multiple swallows;  however, chin tuck decreases residue. Pt's dysphagia secondary to neuromuscular impairment following stroke, delayed timing of swallow initiation and possibly recent extubation. Recommend pt remain NPO until medically ready to repeat objective swallow study. Will continue with SLP intervention targeting PO trials of purees to further readiness for objective study.   SLP Visit Diagnosis Frontal lobe and executive function deficit  Attention and concentration deficit following --  Frontal lobe and executive function deficit following Cerebral infarction  Impact on safety and function Severe aspiration risk      CHL IP TREATMENT RECOMMENDATION 09/14/2017  Treatment Recommendations Therapy as outlined in treatment plan below     Prognosis 09/14/2017  Prognosis for Safe Diet Advancement Fair  Barriers to Reach Goals Cognitive deficits;Severity of deficits;Time post onset  Barriers/Prognosis Comment --    CHL IP DIET RECOMMENDATION 09/14/2017  SLP Diet Recommendations NPO  Liquid Administration via --  Medication Administration --  Compensations --  Postural Changes --      CHL IP OTHER RECOMMENDATIONS 09/14/2017  Recommended Consults --  Oral Care Recommendations Oral care QID  Other Recommendations --      CHL IP FOLLOW UP RECOMMENDATIONS 09/14/2017  Follow up Recommendations Skilled Nursing facility      [facility] IP FREQUENCY AND DURATION 09/14/2017  Speech Therapy Frequency (ACUTE ONLY) min 2x/week  Treatment Duration 2 weeks           CHL IP ORAL PHASE 09/14/2017  Oral Phase Impaired  Oral - Pudding Teaspoon --  Oral - Pudding Cup --  Oral - Honey Teaspoon Holding of bolus;Delayed oral transit;Premature spillage  Oral - Honey Cup Holding of bolus;Delayed oral transit;Premature spillage  Oral - Nectar Teaspoon --  Oral - Nectar Cup --  Oral - Nectar Straw --  Oral - Thin Teaspoon --  Oral - Thin Cup Delayed oral transit;Premature spillage  Oral - Thin Straw --  Oral -  Puree Delayed oral transit;Premature spillage  Oral - Mech Soft --  Oral - Regular --  Oral - Multi-Consistency --  Oral - Pill --  Oral Phase - Comment --    CHL IP PHARYNGEAL PHASE 09/14/2017  Pharyngeal Phase Impaired  Pharyngeal- Pudding Teaspoon --  Pharyngeal --  Pharyngeal- Pudding Cup --  Pharyngeal --  Pharyngeal- Honey Teaspoon Moderate aspiration;Delayed swallow initiation-pyriform sinuses;Reduced pharyngeal peristalsis;Reduced airway/laryngeal closure;Reduced tongue base retraction;Penetration/Aspiration during swallow;Pharyngeal residue - valleculae;Pharyngeal residue - pyriform;Pharyngeal residue - posterior pharnyx  Pharyngeal Material enters airway, passes BELOW cords and not ejected out despite cough attempt by patient  Pharyngeal- Honey Cup --  Pharyngeal --  Pharyngeal- Nectar Teaspoon --  Pharyngeal --  Pharyngeal- Nectar Cup --  Pharyngeal --  Pharyngeal- Nectar Straw --  Pharyngeal --  Pharyngeal- Thin Teaspoon --  Pharyngeal --  Pharyngeal- Thin Cup Moderate aspiration;Delayed swallow initiation-pyriform sinuses;Reduced pharyngeal peristalsis;Reduced airway/laryngeal closure;Reduced tongue base retraction;Penetration/Aspiration during swallow;Pharyngeal residue - valleculae;Pharyngeal residue - pyriform;Pharyngeal residue - posterior pharnyx  Pharyngeal Material enters airway, passes BELOW cords and not ejected out despite cough attempt by patient  Pharyngeal- Thin Straw --  Pharyngeal --  Pharyngeal- Puree Delayed swallow initiation-pyriform sinuses;Reduced pharyngeal peristalsis;Reduced airway/laryngeal closure;Reduced tongue base retraction;Pharyngeal residue - valleculae;Pharyngeal residue - pyriform;Pharyngeal residue - posterior pharnyx  Pharyngeal Material does not enter airway  Pharyngeal- Mechanical Soft --  Pharyngeal --  Pharyngeal- Regular --  Pharyngeal --  Pharyngeal- Multi-consistency --  Pharyngeal --  Pharyngeal- Pill --  Pharyngeal --   Pharyngeal Comment --     CHL IP CERVICAL ESOPHAGEAL PHASE 09/14/2017  Cervical Esophageal Phase WFL  Pudding Teaspoon --  Pudding Cup --  Honey Teaspoon --  Honey Cup --  Nectar Teaspoon --  Nectar Cup --  Nectar Straw --  Thin Teaspoon --  Thin Cup --  Thin Straw --  Puree --  Mechanical Soft --  Regular --  Multi-consistency --  Pill --  Cervical Esophageal Comment --    No flowsheet data found.  Aaron Edelman, Student SLP 09/14/2017, 11:17 AM

## 2017-09-14 NOTE — Progress Notes (Signed)
Cortrak Tube Team Note:  Consult received to place a Cortrak feeding tube.   A 10 F Cortrak tube was placed in the left nare and secured with a nasal bridle at 88 cm. Per the Cortrak monitor reading the tube tip is post pyloric.   No x-ray is required. RN may begin using tube.   If the tube becomes dislodged please keep the tube and contact the Cortrak team at [email or web address] (password [email or web address]) for replacement.  If after hours and replacement cannot be delayed, place a NG tube and confirm placement with an abdominal x-ray.    Koleen Distance MS, RD, LDN Pager #- [phone] After Hours Pager: [phone]

## 2017-09-14 NOTE — Evaluation (Signed)
Speech Language Pathology Evaluation Patient Details Name: Kayla Kent MRN: [id number] DOB: [birth year] Today's Date: 09/14/2017 Time: 3354-5625 SLP Time Calculation (min) (ACUTE ONLY): 31 min  Problem List:  Patient Active Problem List   Diagnosis Date Noted  . Cerebral thrombosis with cerebral infarction 09/13/2017  . Fever   . Aspiration pneumonia due to gastric secretions ([place])   . Leukocytosis   . Acute hypoxemic respiratory failure ([place]) 09/11/2017  . Melena   . Pressure injury of skin 09/09/2017  . Heme positive stool   . Acute upper GI bleed 09/06/2017  . HTN (hypertension) 09/06/2017  . Seizure disorder ([place]) 09/06/2017  . Chronic atrial fibrillation ([place]) 09/06/2017  . Abnormal urinalysis 09/06/2017  . Thyroid nodule 09/06/2017  . CKD (chronic kidney disease), stage III ([place]) 09/06/2017  . Dyslipidemia 09/06/2017  . COPD (chronic obstructive pulmonary disease) ([place]) 09/06/2017  . Emphysema of lung ([place]) 09/06/2017  . CKD (chronic kidney disease) stage 3, GFR 30-59 ml/min (HCC)   . Malignant neoplasm of right upper lobe of lung ([place]) 06/21/2017  . Pulmonary emphysema ([place]) 04/26/2017  . Pleural effusion 04/26/2017  . Near syncope   . Gastroesophageal reflux disease   . Chest pain 04/14/2017  . Lung nodule < 6cm on CT 04/12/2017  . Acute renal failure ([place])   . Sepsis ([place]) 04/06/2017  . Atrial fibrillation with RVR ([place]) 04/06/2017  . Urinary frequency 03/03/2017  . Hemorrhoids 03/03/2017  . Insomnia 03/03/2017  . Postoperative anemia due to acute blood loss 11/24/2016  . Hematoma left thigh 11/24/2016  . Closed femur fracture ([place]) 11/20/2016  . Pedal edema 10/10/2016  . Persistent atrial fibrillation ([place]) 12/11/2014  . S/P ablation of atrial fibrillation 12/11/2014  . Orthostatic hypotension 12/11/2014  . Paroxysmal SVT (supraventricular tachycardia) ([place]) 12/11/2014  . Atherosclerotic peripheral vascular disease ([place]) 12/11/2014  . Paroxysmal atrial  fibrillation ([place]) 11/05/2014  . Orthostasis 10/03/2014  . Non-compliant behavior 10/01/2014  . TIA (transient ischemic attack) 09/23/2014  . Numbness of left hand   . Acute on chronic diastolic heart failure ([place]) 09/21/2014  . Fracture of femoral neck, right ([place]) 08/28/2014  . Distal radius fracture, right 08/28/2014  . Hip fracture requiring operative repair ([place]) 08/28/2014  . Paroxysmal spells 03/28/2014  . Seizures ([place]) 03/28/2013  . Syncope and collapse 07/27/2012  . Routine health maintenance 12/12/2011  . PERIPHERAL CIRCULATORY DISORDER 04/10/2010  . Chronic diastolic CHF (congestive heart failure) ([place]) 07/17/2009  . Depression 07/04/2009  . Anxiety 01/19/2008  . HLD (hyperlipidemia) 11/14/2007  . Essential hypertension 11/14/2007  . PAROXYSMAL ATRIAL TACHYCARDIA 11/14/2007  . UTERINE PROLAPSE 11/14/2007   Past Medical History:  Past Medical History:  Diagnosis Date  . Allergic rhinitis   . Anxiety   . CKD (chronic kidney disease) stage 3, GFR 30-59 ml/min (HCC)   . Emphysema of lung ([place])   . HLD (hyperlipidemia)   . HTN (hypertension)   . Intolerance of drug    orthostatic  . Paroxysmal supraventricular tachycardia ([place])   . PVD (peripheral vascular disease) ([place])   . Seizure ([place])   . Syncope and collapse   . Uterine prolapse without mention of vaginal wall prolapse    Past Surgical History:  Past Surgical History:  Procedure Laterality Date  . CARDIOVERSION N/A 11/05/2014   Procedure: CARDIOVERSION;  Surgeon: Candee Furbish, MD;  Location: [facility] ENDOSCOPY;  Service: Cardiovascular;  Laterality: N/A;  . cataract surgery  08/2015  . CHOLECYSTECTOMY    . corrective eye surgery  as a child  . ESOPHAGOGASTRODUODENOSCOPY N/A 09/09/2017   Procedure: ESOPHAGOGASTRODUODENOSCOPY (EGD);  Surgeon: Mauri Pole, MD;  Location: [facility] ENDOSCOPY;  Service: Endoscopy;  Laterality: N/A;  . INTRAMEDULLARY (IM) NAIL INTERTROCHANTERIC Left 11/21/2016   Procedure:  INTRAMEDULLARY (IM) NAIL INTERTROCHANTRIC HEMI;  Surgeon: Newt Minion, MD;  Location: [facility];  Service: Orthopedics;  Laterality: Left;  . IR PERCUTANEOUS ART THROMBECTOMY/INFUSION INTRACRANIAL INC DIAG ANGIO  09/11/2017  . IVD removed    . OPEN REDUCTION INTERNAL FIXATION (ORIF) DISTAL RADIAL FRACTURE Right 08/29/2014   Procedure: OPEN REDUCTION INTERNAL FIXATION (ORIF) DISTAL RADIAL FRACTURE;  Surgeon: Marianna Payment, MD;  Location: [place] [facility];  Service: Orthopedics;  Laterality: Right;  . RADIOLOGY WITH ANESTHESIA N/A 09/11/2017   Procedure: RADIOLOGY WITH ANESTHESIA;  Surgeon: Luanne Bras, MD;  Location: [place];  Service: Radiology;  Laterality: N/A;  . RF ablation PSVT     summer '10  . stress cardiolite  08/05/93  . TONSILLECTOMY    . TOTAL HIP ARTHROPLASTY Right 08/29/2014   Procedure: Right Hip Hemi Arthroplasty;  Surgeon: Marianna Payment, MD;  Location: [place];  Service: Orthopedics;  Laterality: Right;  Hip procedure 1st wants [employer], [employer], [place].    HPI:  Kayla Kent a 83 y.o.femalewith medical history significant forstage III chronic kidney disease, dyslipidemia, chronic atrial fibrillation on Eliquis, hypertension, seizure disorder on AEDs, and recent FNA of thyroid nodule. Initial admit for upper gi bleed but also having progress oxygen needs found to have multifocal pneumonia thought to be due to aspiration pneumonia in the setting of newaphasia and right hemiplegia yesterday requiringmedicalthrombectomy. MRI Brain 11/27 shows acute ischemic infarct within the left hemisphere, predominantly located within the left precentral gyrus and frontal operculum, also involving the left postcentral gyrus. Pt intubated for thrombectomy on 11/25 - extubated on 11/27.   Assessment / Plan / Recommendation Clinical Impression  Pt presents with language deficits characteristic of expressive aphasia versus apraxia of speech; demonstrates ability to vocalize  familiar information such as her name but unsuccessful at attempts to verbally respond to additional questions. Pt's responses result in groping behaviors and inconsistent sound errors, possibly suggestive of apraxia of speech; generalized oral weakness and lingual discoordination also noted resulting in dysarthric speech. Given MAX verbal and articulatory cues, pt is able to approximate single syllable sounds but unable to clearly shape words of 2 or more syllables. Pt's receptive language within normal limits for tasks assessed; able to follow commands and correctly identify objects via auditory discrimination tasks. Recommend further diagnostic tx for assessment of cognitive functioning; difficult to assess secondary to aphasia. Will continue with acute SLP intervention.     SLP Assessment  SLP Recommendation/Assessment: Patient needs continued Speech Lanaguage Pathology Services SLP Visit Diagnosis: Frontal lobe and executive function deficit Frontal lobe and executive function deficit following: Cerebral infarction    Follow Up Recommendations  Skilled Nursing facility    Frequency and Duration min 2x/week  2 weeks      SLP Evaluation Cognition  Overall Cognitive Status: Impaired/Different from baseline Arousal/Alertness: Awake/alert Orientation Level: Oriented to person Attention: Sustained Sustained Attention: Appears intact Comments: Unable to assess cognition secondary to aphasia.       Comprehension  Auditory Comprehension Overall Auditory Comprehension: Appears within functional limits for tasks assessed Yes/No Questions: Within Functional Limits Commands: Within Functional Limits Interfering Components: Processing speed Visual Recognition/Discrimination Discrimination: Within Function Limits Reading Comprehension Reading Status: Not tested    Expression Expression Primary Mode  of Expression: Verbal Verbal Expression Overall Verbal Expression: Impaired Initiation:  Impaired Automatic Speech: Counting Repetition: Impaired Level of Impairment: Word level Naming: Impairment Confrontation: Impaired Convergent: 0-24% accurate(Possibly due to aphasia/apraxia) Interfering Components: Speech intelligibility Effective Techniques: Phonemic cues;Articulatory cues Written Expression Dominant Hand: Right Written Expression: Not tested   Oral / Motor  Oral Motor/Sensory Function Overall Oral Motor/Sensory Function: Moderate impairment Facial ROM: Reduced right;Suspected CN VII (facial) dysfunction Facial Symmetry: Abnormal symmetry right;Suspected CN VII (facial) dysfunction Facial Strength: Reduced right;Suspected CN VII (facial) dysfunction Lingual ROM: Suspected CN XII (hypoglossal) dysfunction Lingual Strength: Reduced;Suspected CN XII (hypoglossal) dysfunction Mandible: Within Functional Limits Motor Speech Overall Motor Speech: Impaired Respiration: Within functional limits Phonation: Low vocal intensity Articulation: Impaired Level of Impairment: Word Intelligibility: Intelligibility reduced Word: 0-24% accurate Motor Planning: Impaired Level of Impairment: Word Motor Speech Errors: Groping for words;Inconsistent   GO                    Aaron Edelman, Student SLP 09/14/2017, 9:43 AM

## 2017-09-14 NOTE — Progress Notes (Signed)
STROKE TEAM PROGRESS NOTE   HISTORY OF PRESENT ILLNESS (per record) Kayla Kent is an 83 y.o. female 83 y.o.femalewith medical history significant forstage III chronic kidney disease, dyslipidemia, chronic atrial fibrillation on Eliquis, hypertension, seizure disorder on AEDs, and recent FNA of thyroid nodule.   She was admittedfor GI bleed, and Eliquis was held. Restarted yesterday. Around 6:30 PM he was last seen normal during during shift change 7 PM the patient noted to be aphasic and not moving her right side and also respiratory distress. Stroke alert was called and CT head was obtained immediately did not show any hemorrhage Possible hyperdense MCA sign was noticed on the left side. Patient was allergic to contrast per chart, so CT angiogram was not performed. Patient was pre-medicated with hydrocortisone and Benadryl taken to NG tube for diagnostic angiogram with plan for mechanical thrombectomy if large vessel occlusion was.  Date last known well: 11.25.18 Time last known well: 6.30 pm tPA Given: no, on Eliquis NIHSS: 26 Baseline MRS 0     SUBJECTIVE (INTERVAL HISTORY) Her family is not present.  She was extubated yesterday and has tolerated it well. She remains mildly aphasic with right hemiplegia. She is scheduled to undergo swallow eval today  OBJECTIVE Temp:  [97.8 F (36.6 C)-98.6 F (37 C)] 98.6 F (37 C) (11/28 1200) Pulse Rate:  [63-103] 97 (11/28 1400) Cardiac Rhythm: Atrial fibrillation (11/28 0800) Resp:  [15-22] 21 (11/28 1500) BP: (132-177)/(44-75) 154/54 (11/28 1500) SpO2:  [89 %-100 %] 96 % (11/28 1400) Weight:  [107 lb 2.3 oz (48.6 kg)] 107 lb 2.3 oz (48.6 kg) (11/28 0500)  CBC:  Recent Labs  Lab 09/13/17 0334 09/14/17 0429  WBC 10.8* 10.0  NEUTROABS 8.5* 8.1*  HGB 9.0* 9.8*  HCT 27.1* 29.9*  MCV 91.2 93.1  PLT 242 301    Basic Metabolic Panel:  Recent Labs  Lab 09/08/17 0716  09/12/17 0549 09/13/17 0334 09/14/17 0429  NA  --    < >  136 140 142  K  --    < > 4.3 3.7 3.4*  CL  --    < > 106 108 105  CO2  --    < > 22 25 25   GLUCOSE  --    < > 147* 80 66  BUN  --    < > 24* 29* 28*  CREATININE  --    < > 0.75 0.80 0.74  CALCIUM  --    < > 7.3* 7.7* 7.9*  MG 2.0  --  1.8  --   --   PHOS  --   --  4.0  --   --    < > = values in this interval not displayed.    Lipid Panel:     Component Value Date/Time   CHOL 132 10/30/2015 0931   TRIG 111 09/11/2017 2341   TRIG 141 10/25/2006 0906   HDL 33 (L) 10/30/2015 0931   CHOLHDL 4.0 10/30/2015 0931   VLDL 14 10/30/2015 0931   LDLCALC 85 10/30/2015 0931   HgbA1c:  Lab Results  Component Value Date   HGBA1C 5.5 09/13/2017   Urine Drug Screen:     Component Value Date/Time   LABOPIA NONE DETECTED 04/06/2017 1944   COCAINSCRNUR NONE DETECTED 04/06/2017 1944   LABBENZ POSITIVE (A) 04/06/2017 1944   AMPHETMU NONE DETECTED 04/06/2017 1944   THCU NONE DETECTED 04/06/2017 1944   LABBARB NONE DETECTED 04/06/2017 1944    Alcohol Level  Component Value Date/Time   ETH <5 04/06/2017 1907    IMAGING   Ct Head Wo Contrast 09/11/2017 IMPRESSION:  1. Small focus of contrast staining versus petechial hemorrhage at the medial aspect of the left frontal operculum.  2. No intraparenchymal hematoma, mass effect or hydrocephalus.     Ct Chest Wo Contrast 09/11/2017 IMPRESSION:  1. Findings are most compatible with severe multilobar bronchopneumonia.  2. Moderate bilateral pleural effusions with areas of dependent atelectasis in the lower lobes of lungs bilaterally.  3. Cardiomegaly.  4. Aortic atherosclerosis, in addition to left main and 3 vessel coronary artery disease.  5. Previously treated right upper lobe pulmonary nodule is now obscured by overlying airspace disease. Attention on followup studies is recommended.  Aortic Atherosclerosis (ICD10-I70.0).     Dg Chest Port 1 View 09/12/2017 IMPRESSION:  Appliances appear in satisfactory position. Again,  there is diffuse bilateral pulmonary infiltration with small left pleural effusion. Changes likely represent multifocal pneumonia, ARDS, or edema.    Dg Chest Port 1 View 09/11/2017 IMPRESSION:  1. Findings are compatible with worsening severe multilobar pneumonia, most evident throughout the mid to lower lungs bilaterally.  2. Small right and moderate left parapneumonic pleural effusions.  3. Cardiomegaly.  4. Aortic atherosclerosis.    Ct Head Code Stroke Wo Contrast 09/11/2017 IMPRESSION:  1. No acute intracranial abnormality identified.  2. Stable right anterior temporal lobe encephalomalacia, chronic microvascular ischemic changes, and parenchymal volume loss of the brain.  3. ASPECTS is 10    Cerbral  Angiogram - Dr. Estanislado Pandy 09/11/2017 S/P mLt common carotid arteriogram,followed by complete revascularization of occluded lt MCA distal inferior division   With x 2 passes with solitaire 39mm x 40 mm FR retrieval device, and 4.5 mg of IA INTEGRELIN IA achieving a  TICI 3 reperfusion.  MRI Brain :  Acute ischemic infarct within the left hemisphere, predominantly located within the left precentral gyrus and frontal operculum, but also involving the left postcentral gyrus.  Transthoracic Echocardiogram  04/08/2017 Study Conclusions  - Left ventricle: The cavity size was normal. Wall thickness was   normal. Systolic function was normal. The estimated ejection   fraction was in the range of 55% to 60%. Doppler parameters are   consistent with both elevated ventricular end-diastolic filling   pressure and elevated left atrial filling pressure. - Aortic valve: There was trivial regurgitation. - Mitral valve: There was mild regurgitation. Valve area by   continuity equation (using LVOT flow): 1.22 cm^2. - Right atrium: The atrium was mildly dilated. - Atrial septum: There was a patent foramen ovale. - Pulmonary arteries: PA peak pressure: 49 mm Hg (S). - Pericardium,  extracardiac: A trivial pericardial effusion was   identified.   Right LE Venous Dopplers 04/09/2017 No evidence of deep vein thrombosis involving the right lower extremity.      PHYSICAL EXAM Vitals:   09/14/17 1200 09/14/17 1300 09/14/17 1400 09/14/17 1500  BP: (!) 155/72 (!) 163/63 (!) 162/67 (!) 154/54  Pulse: (!) 103 63 97   Resp: 19 19 19  (!) 21  Temp: 98.6 F (37 C)     TempSrc: Oral     SpO2: 97% 99% 96%   Weight:      Height:       Frail elderly caucasian lady not in distress . Afebrile. Head is nontraumatic. Neck is supple without bruit.    Cardiac exam no murmur or gallop. Lungs are clear to auscultation. Distal pulses are well felt. Neurological Exam :  Awake. Mild expressive and receptive aphasia. Speaks only a few words. Follows simple midline and one-step commands only. Left gaze deviation improved. Mild exotropia right eye ( chronic per family). Unable to look to the right and cross midline. Blinks to threat on the left but not on the right. Mild right lower facial weakness. Tongue midline. Follows  midline and 1 step commands on the left. Right upper extremity is   weak but now able to move it against gravity. Able to move right lower extremity  but has mild weakness. Purposeful antigravity movement on the left side. Deep tendon reflexes are depressed in the right and normal on the left. Right plantar upgoing left downgoing.  ASSESSMENT/PLAN Ms. TODD ARGABRIGHT is a 83 y.o. female with history of syncope, seizure disorder, PFO from echo 04/08/2017, peripheral vascular disease, atrial fibrillation on Eliquis, recent GI bleed, hypertension, recent thyroid biopsy, hyperlipidemia, emphysema, chronic kidney disease, and anxiety presenting with right-sided weakness, aphasia, and respiratory distress. She did not receive IV t-PA due to anticoagulation.  Cerbral  Angiogram / Intervention - Dr. Estanislado Pandy 09/11/2017 S/P mLt common carotid arteriogram,followed by complete  revascularization of occluded lt MCA distal inferior division   With x 2 passes with solitaire 25mm x 40 mm FR retrieval device, and 4.5 mg of IA INTEGRELIN IA achieving a  TICI 3 reperfusion.  Stroke:  Left middle cerebral artery - embolic secondary to atrial fibrillation. Eliquis recently held for GI bleed.  Resultant  Mild aphasia, right visual field loss and right hemiplegia  CT head - Small focus of contrast staining vs petechial hemorrhage at the medial aspect of the lt frontal operculum.   MRI head - - not performed  MRA head - not performed  Carotid Doppler - cerebral angiogram  2D Echo - 04/08/2017 - EF 55-60% - PFO noted  LDL - 85  HgbA1c - 5.5  VTE prophylaxis - SCDs Diet NPO time specified  Eliquis (apixaban) daily prior to admission, now on No antithrombotic  Patient will be counseled to be compliant with her antithrombotic medications  Ongoing aggressive stroke risk factor management  Therapy recommendations:  pending  Disposition:  Pending  Hypertension  Stable  Permissive hypertension (OK if < 220/120) but gradually normalize in 5-7 days  Long-term BP goal normotensive  Hyperlipidemia  Home meds:  Lipitor 10 mg daily not resumed in hospital  LDL 85, goal < 70  Increase Lipitor to 20 mg daily  Continue statin at discharge   Other Stroke Risk Factors  Advanced age  Former cigarette smoker - quit 23 years ago  PFO  Afib on Eliquis (held for GI bleed)  Other Active Problems  Multi lobar pneumonia  Anemia - 10 / 29.5  CKD  Contrast allergy  Seizure disorder - Keppra  Currently not anticoagulated and not on antiplatelet therapy. (Recent upper GI bleed)   Plan / Recommendations     Start IV heparin drip unless patient passes swallow eval then will start eliquis. I had a long discussion with the patient's son over the phone and discussed the risk-benefit of anticoagulation versus bleeding risk. Since she   had an embolic stroke when  eliquis was on hold she needs to restart anticoagulation soon. Family understands increased risk for bleeding and is agreeable with the plan to start anticoagulation Hospital day # 8  I have personally examined this patient, reviewed notes, independently viewed imaging studies, participated in medical decision making and plan of care.ROS completed by me personally and pertinent positives  fully documented  I have made any additions or clarifications directly to the above . Marland Kitchen Discussed with Dr. Sabra Heck.and Croituru This patient is critically ill and at significant risk of neurological worsening, death and care requires constant monitoring of vital signs, hemodynamics,respiratory and cardiac monitoring, extensive review of multiple databases, frequent neurological assessment, discussion with family, other specialists and medical decision making of high complexity.I have made any additions or clarifications directly to the above note.This critical care time does not reflect procedure time, or teaching time or supervisory time of PA/NP/Med Resident etc but could involve care discussion time.  I spent 35 minutes of neurocritical care time  in the care of  this patient.     Antony Contras, MD Medical Director [facility] Pager: [phone] 09/14/2017 3:40 PM   To contact Stroke Continuity provider, please refer to [email or web address] After hours, contact General Neurology

## 2017-09-15 ENCOUNTER — Ambulatory Visit: Payer: Medicare Other | Admitting: Pulmonary Disease

## 2017-09-15 ENCOUNTER — Inpatient Hospital Stay (HOSPITAL_COMMUNITY): Payer: Medicare Other

## 2017-09-15 DIAGNOSIS — J9601 Acute respiratory failure with hypoxia: Secondary | ICD-10-CM

## 2017-09-15 LAB — CBC WITH DIFFERENTIAL/PLATELET
BASOS ABS: 0 10*3/uL (ref 0.0–0.1)
Basophils Relative: 0 %
EOS PCT: 1 %
Eosinophils Absolute: 0.1 10*3/uL (ref 0.0–0.7)
HEMATOCRIT: 31.8 % — AB (ref 36.0–46.0)
Hemoglobin: 10.3 g/dL — ABNORMAL LOW (ref 12.0–15.0)
LYMPHS ABS: 1.2 10*3/uL (ref 0.7–4.0)
LYMPHS PCT: 11 %
MCH: 30.2 pg (ref 26.0–34.0)
MCHC: 32.4 g/dL (ref 30.0–36.0)
MCV: 93.3 fL (ref 78.0–100.0)
MONO ABS: 0.7 10*3/uL (ref 0.1–1.0)
Monocytes Relative: 6 %
NEUTROS ABS: 9.1 10*3/uL — AB (ref 1.7–7.7)
Neutrophils Relative %: 82 %
Platelets: 391 10*3/uL (ref 150–400)
RBC: 3.41 MIL/uL — AB (ref 3.87–5.11)
RDW: 15.3 % (ref 11.5–15.5)
WBC: 11 10*3/uL — ABNORMAL HIGH (ref 4.0–10.5)

## 2017-09-15 LAB — HEPARIN LEVEL (UNFRACTIONATED)
HEPARIN UNFRACTIONATED: 0.26 [IU]/mL — AB (ref 0.30–0.70)
Heparin Unfractionated: 0.17 IU/mL — ABNORMAL LOW (ref 0.30–0.70)

## 2017-09-15 LAB — GLUCOSE, CAPILLARY
GLUCOSE-CAPILLARY: 125 mg/dL — AB (ref 65–99)
GLUCOSE-CAPILLARY: 133 mg/dL — AB (ref 65–99)
GLUCOSE-CAPILLARY: 140 mg/dL — AB (ref 65–99)
GLUCOSE-CAPILLARY: 141 mg/dL — AB (ref 65–99)
GLUCOSE-CAPILLARY: 143 mg/dL — AB (ref 65–99)
GLUCOSE-CAPILLARY: 150 mg/dL — AB (ref 65–99)
GLUCOSE-CAPILLARY: 81 mg/dL (ref 65–99)

## 2017-09-15 LAB — TRIGLYCERIDES: Triglycerides: 99 mg/dL (ref <150)

## 2017-09-15 LAB — PHOSPHORUS
PHOSPHORUS: 2.6 mg/dL (ref 2.5–4.6)
Phosphorus: 2.2 mg/dL — ABNORMAL LOW (ref 2.5–4.6)

## 2017-09-15 LAB — MAGNESIUM
MAGNESIUM: 2.2 mg/dL (ref 1.7–2.4)
Magnesium: 2.2 mg/dL (ref 1.7–2.4)

## 2017-09-15 MED ORDER — LEVETIRACETAM 100 MG/ML PO SOLN
250.0000 mg | Freq: Two times a day (BID) | ORAL | Status: DC
  Administered 2017-09-15 – 2017-09-16 (×2): 250 mg via ORAL
  Filled 2017-09-15 (×2): qty 5

## 2017-09-15 MED ORDER — POTASSIUM CHLORIDE 20 MEQ/15ML (10%) PO SOLN
20.0000 meq | ORAL | Status: AC
  Administered 2017-09-15 (×2): 20 meq
  Filled 2017-09-15: qty 15

## 2017-09-15 MED ORDER — ORAL CARE MOUTH RINSE
15.0000 mL | Freq: Two times a day (BID) | OROMUCOSAL | Status: DC
  Administered 2017-09-16: 15 mL via OROMUCOSAL

## 2017-09-15 NOTE — Progress Notes (Signed)
PULMONARY / CRITICAL CARE MEDICINE   Name: Kayla Kent MRN: [id number] DOB: 09-16-1932    ADMISSION DATE:  09/06/2017 CONSULTATION DATE:  09/11/2017  REFERRING MD:  Lesia Sago    CHIEF COMPLAINT:  Respiratory failure  HISTORY OF PRESENT ILLNESS:   83 year old female history of on apixaban, seizure disorder, documented COPD, right upper lobe lung cancer status post radiation therapy who initially presented 09/06/17 for confusion and hematemesis after being found down.  EGD was performed 11/23 which was only notable for patchy mild inflammation thoughout the stomach, respumed gastritis with stable hemoglobin and no further episodes of bleeding.  In addition she was being treated for an ESBL UTI based on urinalysis showing many bacteria and prior cultures showing ESBL E. coli.  Urine cultures here subsequently grew greater than 100,000 Klebsiella pneumoniae.  She was also noted to have multifocal infiltrates on her CT chest concerning for pneumonia.  Infectious disease was consulted on 11/25 and recommended changing vancomycin to linezolid for presumed aspiration causing her pneumonia throughout her admission her oxygen requirement has progressively increased to requiring a nonrebreather today to maintain oxygen saturation at 90% for which we were consulted.  Intubated to undergo medical thrombectomy by interventional radiology for acute stroke.  Patient to be assessed today for possible extubation.  Care plan discussed with stroke neurology service.    SUBJECTIVE:  Sleepy this am, more difficult to arouse. Stroke aware.  May re-scan later today.    VITAL SIGNS: BP 126/61 (BP Location: Left Arm)   Pulse 66   Temp 98 F (36.7 C) (Axillary)   Resp 16   Ht 5\' 7"  (1.702 m)   Wt 48.8 kg (107 lb 9.4 oz)   SpO2 95%   BMI 16.85 kg/m   HEMODYNAMICS:    VENTILATOR SETTINGS:    INTAKE / OUTPUT: I/O last 3 completed shifts: In: 1112.6 [I.V.:108.3; NG/GT:404.3; IV  Piggyback:600] Out: 3665 [Urine:3665]  PHYSICAL EXAMINATION: General: NAD sitting chair, very drowsy  Neuro: opens eyes to loud voice but closes them again quickly, R arm flaccid, does not follow commands, PERRL  HEENT:   No icterus. No JVD.  Tongue is midline upon exam. Cardiovascular: S1, S2.  Irregularly irregular rhythm.  Lungs: resps even non labored on [place], diminihsed bases otherwise clear   Abdomen: Flat, nontender.  No muscle guarding.  No organomegaly. Musculoskeletal: No pitting edema, clubbing, cyanosis. Skin: No ecchymoses.  LABS:  BMET Recent Labs  Lab 09/12/17 0549 09/13/17 0334 09/14/17 0429  NA 136 140 142  K 4.3 3.7 3.4*  CL 106 108 105  CO2 22 25 25   BUN 24* 29* 28*  CREATININE 0.75 0.80 0.74  GLUCOSE 147* 80 66    Electrolytes Recent Labs  Lab 09/12/17 0549 09/13/17 0334 09/14/17 0429 09/14/17 1622 09/15/17 0608  CALCIUM 7.3* 7.7* 7.9*  --   --   MG 1.8  --   --  2.1 2.2  PHOS 4.0  --   --  2.6 2.6    CBC Recent Labs  Lab 09/13/17 0334 09/14/17 0429 09/15/17 0608  WBC 10.8* 10.0 11.0*  HGB 9.0* 9.8* 10.3*  HCT 27.1* 29.9* 31.8*  PLT 242 329 391    Coag's Recent Labs  Lab 09/11/17 2010 09/13/17 0334  APTT 36  --   INR 1.22 1.17    Sepsis Markers Recent Labs  Lab 09/11/17 0831 09/11/17 1119 09/12/17 0549 09/13/17 0334  LATICACIDVEN 1.4 2.4*  --   --   PROCALCITON 2.17  --  1.90 1.31    ABG Recent Labs  Lab 09/11/17 1255 09/11/17 2359 09/13/17 0405  PHART 7.529* 7.350 7.495*  PCO2ART 31.7* 44.0 33.9  PO2ART 58.7* 79.2* 117*    Liver Enzymes No results for input(s): AST, ALT, ALKPHOS, BILITOT, ALBUMIN in the last 168 hours.  Cardiac Enzymes No results for input(s): TROPONINI, PROBNP in the last 168 hours.  Glucose Recent Labs  Lab 09/14/17 2029 09/14/17 2355 09/15/17 0355 09/15/17 0805  GLUCAP 81 81 125* 140*    Imaging Dg Swallowing Func-speech Pathology  Result Date: 09/14/2017 Completed by  Aaron Edelman, SLP Student. Supervised and reviewed by Herbie Baltimore MA CCC-SLP Objective Swallowing Evaluation: Type of Study: MBS-Modified Barium Swallow Study  Patient Details Name: Kayla Kent MRN: [id number] Date of Birth: Feb 20, 1932 Today's Date: 09/14/2017 Time: SLP Start Time (ACUTE ONLY): 1020 -SLP Stop Time (ACUTE ONLY): 1037 SLP Time Calculation (min) (ACUTE ONLY): 17 min Past Medical History: Past Medical History: Diagnosis Date . Allergic rhinitis  . Anxiety  . CKD (chronic kidney disease) stage 3, GFR 30-59 ml/min (HCC)  . Emphysema of lung ([place])  . HLD (hyperlipidemia)  . HTN (hypertension)  . Intolerance of drug   orthostatic . Paroxysmal supraventricular tachycardia ([place])  . PVD (peripheral vascular disease) ([place])  . Seizure ([place])  . Syncope and collapse  . Uterine prolapse without mention of vaginal wall prolapse  Past Surgical History: Past Surgical History: Procedure Laterality Date . CARDIOVERSION N/A 11/05/2014  Procedure: CARDIOVERSION;  Surgeon: Candee Furbish, MD;  Location: [facility] ENDOSCOPY;  Service: Cardiovascular;  Laterality: N/A; . cataract surgery  08/2015 . CHOLECYSTECTOMY   . corrective eye surgery    as a child . ESOPHAGOGASTRODUODENOSCOPY N/A 09/09/2017  Procedure: ESOPHAGOGASTRODUODENOSCOPY (EGD);  Surgeon: Mauri Pole, MD;  Location: [facility] ENDOSCOPY;  Service: Endoscopy;  Laterality: N/A; . INTRAMEDULLARY (IM) NAIL INTERTROCHANTERIC Left 11/21/2016  Procedure: INTRAMEDULLARY (IM) NAIL INTERTROCHANTRIC HEMI;  Surgeon: Newt Minion, MD;  Location: [place];  Service: Orthopedics;  Laterality: Left; . IR PERCUTANEOUS ART THROMBECTOMY/INFUSION INTRACRANIAL INC DIAG ANGIO  09/11/2017 . IVD removed   . OPEN REDUCTION INTERNAL FIXATION (ORIF) DISTAL RADIAL FRACTURE Right 08/29/2014  Procedure: OPEN REDUCTION INTERNAL FIXATION (ORIF) DISTAL RADIAL FRACTURE;  Surgeon: Marianna Payment, MD;  Location: [place];  Service: Orthopedics;  Laterality: Right; . RADIOLOGY WITH ANESTHESIA N/A  09/11/2017  Procedure: RADIOLOGY WITH ANESTHESIA;  Surgeon: Luanne Bras, MD;  Location: [place];  Service: Radiology;  Laterality: N/A; . RF ablation PSVT    summer '10 . stress cardiolite  08/05/93 . TONSILLECTOMY   . TOTAL HIP ARTHROPLASTY Right 08/29/2014  Procedure: Right Hip Hemi Arthroplasty;  Surgeon: Marianna Payment, MD;  Location: [place];  Service: Orthopedics;  Laterality: Right;  Hip procedure 1st wants [employer], [employer], [place].  HPI: Kayla Kent a 83 y.o.femalewith medical history significant forstage III chronic kidney disease, dyslipidemia, chronic atrial fibrillation on Eliquis, hypertension, seizure disorder on AEDs, and recent FNA of thyroid nodule. Initial admit for upper gi bleed but also having progress oxygen needs found to have multifocal pneumonia thought to be due to aspiration pneumonia in the setting of newaphasia and right hemiplegia yesterday requiringmedicalthrombectomy. MRI Brain 11/27 shows acute ischemic infarct within the left hemisphere, predominantly located within the left precentral gyrus and frontal operculum, also involving the left postcentral gyrus. Pt intubated for thrombectomy on 11/25 - extubated on 11/27.  No Data Recorded Assessment / Plan / Recommendation CHL IP CLINICAL IMPRESSIONS 09/14/2017 Clinical Impression  Pt presents with moderate-severe oropharyngeal dysphagia marked by oral holding, delayed swallow initiation to the pyriforms, moderate residue post swallow and aspiration during the swallow with thin and honey-thick liquid consistencies. Pt required MAX cueing and sensory feedback (large boluses/spoon bolus further back) for swallow initiation. Aspirate sensed by patient; however, pt unable to effectively cough to clear aspirate from airway. Puree consistency transited without penetration/aspiration however, resulted in moderate pharyngeal residue. Pt unable to clear residue with multiple swallows; however, chin tuck decreases  residue. Pt's dysphagia secondary to neuromuscular impairment following stroke, delayed timing of swallow initiation and possibly recent extubation. Recommend pt remain NPO until medically ready to repeat objective swallow study. Will continue with SLP intervention targeting PO trials of purees to further readiness for objective study.  SLP Visit Diagnosis Frontal lobe and executive function deficit Attention and concentration deficit following -- Frontal lobe and executive function deficit following Cerebral infarction Impact on safety and function Severe aspiration risk   CHL IP TREATMENT RECOMMENDATION 09/14/2017 Treatment Recommendations Therapy as outlined in treatment plan below   Prognosis 09/14/2017 Prognosis for Safe Diet Advancement Fair Barriers to Reach Goals Cognitive deficits;Severity of deficits;Time post onset Barriers/Prognosis Comment -- CHL IP DIET RECOMMENDATION 09/14/2017 SLP Diet Recommendations NPO Liquid Administration via -- Medication Administration -- Compensations -- Postural Changes --   CHL IP OTHER RECOMMENDATIONS 09/14/2017 Recommended Consults -- Oral Care Recommendations Oral care QID Other Recommendations --   CHL IP FOLLOW UP RECOMMENDATIONS 09/14/2017 Follow up Recommendations Skilled Nursing facility   [facility] IP FREQUENCY AND DURATION 09/14/2017 Speech Therapy Frequency (ACUTE ONLY) min 2x/week Treatment Duration 2 weeks      CHL IP ORAL PHASE 09/14/2017 Oral Phase Impaired Oral - Pudding Teaspoon -- Oral - Pudding Cup -- Oral - Honey Teaspoon Holding of bolus;Delayed oral transit;Premature spillage Oral - Honey Cup Holding of bolus;Delayed oral transit;Premature spillage Oral - Nectar Teaspoon -- Oral - Nectar Cup -- Oral - Nectar Straw -- Oral - Thin Teaspoon -- Oral - Thin Cup Delayed oral transit;Premature spillage Oral - Thin Straw -- Oral - Puree Delayed oral transit;Premature spillage Oral - Mech Soft -- Oral - Regular -- Oral - Multi-Consistency -- Oral - Pill -- Oral Phase  - Comment --  CHL IP PHARYNGEAL PHASE 09/14/2017 Pharyngeal Phase Impaired Pharyngeal- Pudding Teaspoon -- Pharyngeal -- Pharyngeal- Pudding Cup -- Pharyngeal -- Pharyngeal- Honey Teaspoon Moderate aspiration;Delayed swallow initiation-pyriform sinuses;Reduced pharyngeal peristalsis;Reduced airway/laryngeal closure;Reduced tongue base retraction;Penetration/Aspiration during swallow;Pharyngeal residue - valleculae;Pharyngeal residue - pyriform;Pharyngeal residue - posterior pharnyx Pharyngeal Material enters airway, passes BELOW cords and not ejected out despite cough attempt by patient Pharyngeal- Honey Cup -- Pharyngeal -- Pharyngeal- Nectar Teaspoon -- Pharyngeal -- Pharyngeal- Nectar Cup -- Pharyngeal -- Pharyngeal- Nectar Straw -- Pharyngeal -- Pharyngeal- Thin Teaspoon -- Pharyngeal -- Pharyngeal- Thin Cup Moderate aspiration;Delayed swallow initiation-pyriform sinuses;Reduced pharyngeal peristalsis;Reduced airway/laryngeal closure;Reduced tongue base retraction;Penetration/Aspiration during swallow;Pharyngeal residue - valleculae;Pharyngeal residue - pyriform;Pharyngeal residue - posterior pharnyx Pharyngeal Material enters airway, passes BELOW cords and not ejected out despite cough attempt by patient Pharyngeal- Thin Straw -- Pharyngeal -- Pharyngeal- Puree Delayed swallow initiation-pyriform sinuses;Reduced pharyngeal peristalsis;Reduced airway/laryngeal closure;Reduced tongue base retraction;Pharyngeal residue - valleculae;Pharyngeal residue - pyriform;Pharyngeal residue - posterior pharnyx Pharyngeal Material does not enter airway Pharyngeal- Mechanical Soft -- Pharyngeal -- Pharyngeal- Regular -- Pharyngeal -- Pharyngeal- Multi-consistency -- Pharyngeal -- Pharyngeal- Pill -- Pharyngeal -- Pharyngeal Comment --  CHL IP CERVICAL ESOPHAGEAL PHASE 09/14/2017 Cervical Esophageal Phase WFL Pudding Teaspoon -- Pudding Cup -- Honey Teaspoon -- Honey Cup --  Nectar Teaspoon -- Nectar Cup -- Nectar Straw -- Thin  Teaspoon -- Thin Cup -- Thin Straw -- Puree -- Mechanical Soft -- Regular -- Multi-consistency -- Pill -- Cervical Esophageal Comment -- No flowsheet data found. Supervised and reviewed by [facility] MA CCC-SLP [place], Katherene Ponto 09/14/2017, 11:51 AM              [name]  ECHO COMPLETE WO IMAGING ENHANCING AGENT  Order# [id number]  Reading physician: Josue Hector, MD Ordering physician: Rise Patience, MD Study date: 04/08/17  Study Result   Result status: Final result                              *[facility]*                         [address], [place] [address]                            [phone]  ------------------------------------------------------------------- Transthoracic Echocardiography  Patient:    Kayla Kent, Kayla Kent MR #:       [id number] Study Date: 04/08/2017 Gender:     F Age:        13 Height:     170.2 cm Weight:     56.7 kg BSA:        1.63 m^2 Pt. Status: Room:       3E06C   ADMITTING    Linward Foster  REFERRING    Rise Patience  PERFORMING   [place], Inpatient  SONOGRAPHER  Mikki Santee  ATTENDING    Fatima Blank  cc:  ------------------------------------------------------------------- LV EF: 55% -   60%  ------------------------------------------------------------------- History:   PMH:  Elevated Troponin.  Risk factors:  Hypertension. Dyslipidemia.  ------------------------------------------------------------------- Study Conclusions  - Left ventricle: The cavity size was normal. Wall thickness was   normal. Systolic function was normal. The estimated ejection   fraction was in the range of 55% to 60%. Doppler parameters are   consistent with both elevated ventricular end-diastolic filling   pressure and elevated left atrial filling pressure. - Aortic valve: There was  trivial regurgitation. - Mitral valve: There was mild regurgitation. Valve area by   continuity equation (using LVOT flow): 1.22 cm^2. - Right atrium: The atrium was mildly dilated. - Atrial septum: There was a patent foramen ovale. - Pulmonary arteries: PA peak pressure: 49 mm Hg (S). - Pericardium, extracardiac: A trivial pericardial effusion was   identified.  ------------------------------------------------------------------- Study data:  Comparison was made to the study of 09/23/2014.  Study status:  Routine.  Procedure:  The patient reported no pain pre or post test. Transthoracic echocardiography. Image quality was adequate.  Study completion:  There were no complications. Transthoracic echocardiography.  M-mode, complete 2D, spectral Doppler, and color Doppler.  Birthdate:  Patient birthdate: 1932-03-25.  Age:  Patient is 83 yr old.  Sex:  Gender: female. BMI: 19.6 kg/m^2.  Blood pressure:     110/50  Patient status: Inpatient.  Study date:  Study date:  04/08/2017. Study time: 06:27 PM.  Location:  Bedside.  -------------------------------------------------------------------  ------------------------------------------------------------------- Left ventricle:  The cavity size was normal. Wall thickness was normal. Systolic function was normal. The estimated ejection fraction was in the range of 55% to 60%. Doppler parameters are consistent with both elevated ventricular end-diastolic filling pressure and elevated left atrial filling pressure.  ------------------------------------------------------------------- Aortic valve:   Trileaflet; mildly thickened, mildly calcified leaflets.  Doppler:   There was no stenosis.   There was trivial regurgitation.  ------------------------------------------------------------------- Aorta:  The aorta was normal, not dilated, and non-diseased.  ------------------------------------------------------------------- Mitral valve:    Mildly thickened leaflets .  Doppler:  There was mild regurgitation.    Valve area by continuity equation (using LVOT flow): 1.22 cm^2. Indexed valve area by continuity equation (using LVOT flow): 0.75 cm^2/m^2.    Mean gradient (D): 3 mm Hg.   ------------------------------------------------------------------- Left atrium:  The atrium was normal in size.  ------------------------------------------------------------------- Atrial septum:  There was a patent foramen ovale.  ------------------------------------------------------------------- Right ventricle:  The cavity size was normal. Wall thickness was normal. Systolic function was normal.  ------------------------------------------------------------------- Pulmonic valve:    Doppler:  There was mild regurgitation.  ------------------------------------------------------------------- Tricuspid valve:   Doppler:  There was mild regurgitation.  ------------------------------------------------------------------- Right atrium:  The atrium was mildly dilated.  ------------------------------------------------------------------- Pericardium:  A trivial pericardial effusion was identified.  ------------------------------------------------------------------- Systemic veins: Inferior vena cava: The vessel was dilated. The respirophasic diameter changes were blunted (< 50%), consistent with elevated central venous pressure.  ------------------------------------------------------------------- Post procedure conclusions Ascending Aorta:  - The aorta was normal, not dilated, and non-diseased.  ------------------------------------------------------------------- Measurements   Left ventricle                           Value          Reference  LV ID, ED, PLAX chordal          (L)     41    mm       43 - 52  LV ID, ES, PLAX chordal                  28    mm       23 - 38  LV fx shortening, PLAX chordal           32    %         >=29  LV PW thickness, ED                      7     mm       ----------  IVS/LV PW ratio, ED                      1.14           <=1.3  Stroke volume, 2D                        41    ml       ----------  Stroke volume/bsa, 2D                    25    ml/m^2   ----------  LV e&', lateral                           8.92  cm/s     ----------  LV e&', medial                            10.2  cm/s     ----------  LV e&', average                           9.56  cm/s     ----------    Ventricular septum                       Value          Reference  IVS thickness, ED                        8     mm       ----------    LVOT                                     Value          Reference  LVOT ID, S                               16    mm       ----------  LVOT area                                2.01  cm^2     ----------  LVOT peak velocity, S                    103   cm/s     ----------  LVOT mean velocity, S                    66.8  cm/s     ----------  LVOT VTI, S                              20.2  cm       ----------  LVOT peak gradient, S                    4     mm Hg    ----------    Aortic valve                             Value          Reference  Aortic regurg pressure half-time         872   ms       ----------    Aorta                                    Value          Reference  Aortic root ID, ED                       28    mm       ----------    Left atrium  Value          Reference  LA ID, A-P, ES                           35    mm       ----------  LA ID/bsa, A-P                           2.15  cm/m^2   <=2.2  LA volume, S                             75.3  ml       ----------  LA volume/bsa, S                         46.2  ml/m^2   ----------  LA volume, ES, 1-p A4C                   65.9  ml       ----------  LA volume/bsa, ES, 1-p A4C               40.4  ml/m^2   ----------  LA volume, ES, 1-p A2C                   82.4  ml       ----------  LA  volume/bsa, ES, 1-p A2C               50.5  ml/m^2   ----------    Mitral valve                             Value          Reference  Mitral mean velocity, D                  70.6  cm/s     ----------  Mitral deceleration time                 208   ms       150 - 230  Mitral mean gradient, D                  3     mm Hg    ----------  Mitral E/A ratio, peak                   4.6            ----------  Mitral valve area, LVOT                  1.22  cm^2     ----------  continuity  Mitral valve area/bsa, LVOT              0.75  cm^2/m^2 ----------  continuity  Mitral annulus VTI, D                    33.2  cm       ----------    Pulmonary arteries                       Value          Reference  PA pressure, S, DP               (  H)     49    mm Hg    <=30    Tricuspid valve                          Value          Reference  Tricuspid regurg peak velocity           321   cm/s     ----------  Tricuspid peak RV-RA gradient            41    mm Hg    ----------    Right atrium                             Value          Reference  RA ID, S-I, ES, A4C              (H)     60.1  mm       34 - 49  RA area, ES, A4C                 (H)     23.6  cm^2     8.3 - 19.5  RA volume, ES, A/L                       74.1  ml       ----------  RA volume/bsa, ES, A/L                   45.5  ml/m^2   ----------    Systemic veins                           Value          Reference  Estimated CVP                            8     mm Hg    ----------    Right ventricle                          Value          Reference  TAPSE                                    12.6  mm       ----------  RV pressure, S, DP               (H)     49    mm Hg    <=30  RV s&', lateral, S                        9.36  cm/s     ----------  Legend: (L)  and  (H)  mark values outside specified reference range.  ------------------------------------------------------------------- Prepared and Electronically Authenticated by  Jenkins Rouge, M.D. 2018-06-23T11:04:29     STUDIES:  Luanne Bras, MD  Physician  Radiology  Procedures  Signed  Date of Service:  09/11/2017 11:04 PM        Pre-procedure Diagnoses  Middle cerebral artery embolism, left [I66.02]  Post-procedure Diagnoses  Middle cerebral artery embolism, left [I66.02]  Procedures  CEREBRAL ANGIOGRAM [[id number] (Custom)]    Signed           S/P mLt common carotid arteriogram,followed by complete revascularization of occluded lt MCA distal inferior division   With x 2 passes with solitaire 73mm x 40 mm FR retrieval device, and 4.5 mg of IA INTEGRELIN IA achieving a TICI 3 reperfusion.           Chest xray 11/27: CLINICAL DATA:  83 year old female status post left MCA emergent large vessel occlusion and endovascular revascularization.  EXAM: PORTABLE CHEST 1 VIEW  COMPARISON:  Portable chest 09/11/2017 and earlier.  FINDINGS: Portable AP semi upright view at 0614 hours. Stable endotracheal tube tip just below the level the clavicles. Enteric tube courses to the abdomen, tip not included.  Large lung volumes with coarse bilateral pulmonary interstitial  opacity bilateral perihilar opacity has mildly regressed since 09/11/2017. Stable confluent left lung base opacity with obscuration of the left hemidiaphragm. No pneumothorax. No definite right pleural effusion.  Stable cardiac size and mediastinal contours. Calcified aortic atherosclerosis.  IMPRESSION: 1.  Stable lines and tubes. 2. Continued left pleural effusion with associated left lung base collapse or consolidation. 3. Coarse bilateral pulmonary interstitial opacity but regression of indistinct perihilar opacity (perhaps perihilar pulmonary edema) since 09/11/2017.    CULTURES: Respiratory viral panel negative Urine culture: > No growth on November 26.  09/11/17 blood culture pending MRSA screen negative    ANTIBIOTICS: cipro 11/20-11/21 Meropenem  11/21>>> Vancomycin 11/22-11/24 linezolid 11/25>>   SIGNIFICANT EVENTS: Status post thrombectomy of the left MCA.  LINES/TUBES: Peripherals  DISCUSSION: 83 year old female developed a aphasia and right hemiplegia yesterday requiring medical thrombectomy.  Patient intubated.  ASSESSMENT / PLAN:  PULMONARY A: 1.  Acute respiratory failure with hypoxemia; improved with diuresis.  Status post extubation. 2.  COPD, undocumented 3.  Pleural effusions 4.  Aspiration with pneumonia P:   Supplemental O2 as needed  PRN BD  Continue abx for now with AMS Pulmonary hygiene as able  F/u CXR   CARDIOVASCULAR A:  Atrial fibrillation. Probable diastolic congestive heart failure P: Cards following  No anticoagulation for now  Continue to hold cardizem  F.u CXR  Hold diuresis for now     RENAL A: No issues.  P:   No current plan.  GASTROINTESTINAL A: Gastritis, status post EGD  P: 1.  Continue Protonix.  Continue to monitor the hemoglobin. 2.  Modified barium swallow today to assess for aspiration.  HEMATOLOGIC A: No active issues P:  SCD's  INFECTIOUS A:   Aspiration pneumonia  P:   Continue abx for now     ENDOCRINE A: No active issues  NEUROLOGIC A: Acute stroke - s/p thrombectomy  AMS - worsening mental status 11/29 P:   No anticoagulation for now with recent GI bleed  eliquis when ok with neuro/GI  Repeat CT given AMS  Stroke aware of mental status change  Check u/a    FAMILY  - Updates family updated at bedside 11/29 - Inter-disciplinary family meet or Palliative Care meeting due by: day Puhi, NP 09/15/2017  10:24 AM Pager: [phone] or [phone]

## 2017-09-15 NOTE — Progress Notes (Signed)
Physical Therapy Treatment Patient Details Name: Kayla Kent MRN: [id number] DOB: 1932/03/20 Today's Date: 09/15/2017    History of Present Illness Pt is an 83 y.o. female who presented to the ED with fall at home, blood in stool, red emesis, and confusion. Pt underwent EGD and colonoscopy on 09/09/2017. Neurologic status change 11/25 and pt underwent revascularization procedure of L MCA. Extubated 11/27. MRI + acute ischemic infarct L hemisphere - L precentral gyrus and frontal operculum and L postcentral gyrus. PMH significant for anxiety, CKD, emphysema of lung, HLD, HTN, intolerance of drugs, paroxysmal supraventricular tachycardia, PVD, and seizure.     PT Comments    Pt with no verbalization throughout session. Following single step simple commands grossly 75% of the time. Pt able to tolerate standing and stepping today with gait limited by balance and cognition. Will continue to follow to progress mobility and function. Continue to recommend CIR.    Follow Up Recommendations  CIR;Supervision/Assistance - 24 hour     Equipment Recommendations       Recommendations for Other Services       Precautions / Restrictions Precautions Precautions: Fall Precaution Comments: [place] Restrictions Weight Bearing Restrictions: No    Mobility  Bed Mobility Overal bed mobility: Needs Assistance Bed Mobility: Rolling;Sidelying to Sit Rolling: Max assist Sidelying to sit: Mod assist       General bed mobility comments: pt with cues for sequence with pt able to roll to right side with max assist on 1st trial and min assist on 2nd trial with hand over hand cueing, max assist to roll left. Side to sit pt assisting with bringing legs off of bed and pushing with LUE to raise surface with cues for sequence  Transfers Overall transfer level: Needs assistance   Transfers: Sit to/from Stand Sit to Stand: Max assist Stand pivot transfers: Max assist       General transfer comment:  max assist to stand from bed and pivot to chair with face to face technique with belt. pt then stood again from chair with 2 person assist, cues for pushing with LUE and mod assist.   Ambulation/Gait Ambulation/Gait assistance: Max assist;+2 physical assistance Ambulation Distance (Feet): 20 Feet Assistive device: 2 person hand held assist Gait Pattern/deviations: Shuffle;Trunk flexed;Narrow base of support   Gait velocity interpretation: Below normal speed for age/gender General Gait Details: pt with posterior lean, flexed trunk and limited step length but advancing bil LE with bil UE support and assist for anterior translation with max cues for posture, balance and safety. PT performed best with therapist posterior right for trunk extension and tech supporting LUE for gait   Stairs            Wheelchair Mobility    Modified Rankin (Stroke Patients Only) Modified Rankin (Stroke Patients Only) Pre-Morbid Rankin Score: No symptoms Modified Rankin: Severe disability     Balance Overall balance assessment: Needs assistance   Sitting balance-Leahy Scale: Poor Sitting balance - Comments: pt able to maintain sitting EOb with LUe grapsing footboard and minguard for safety and cues, maintains flexed trunk     Standing balance-Leahy Scale: Zero Standing balance comment: kyphotic posture with max assist for standing balance                            Cognition Arousal/Alertness: Awake/alert Behavior During Therapy: Flat affect Overall Cognitive Status: Impaired/Different from baseline  Following Commands: Follows one step commands with increased time Safety/Judgement: Decreased awareness of safety;Decreased awareness of deficits   Problem Solving: Decreased initiation;Requires verbal cues;Requires tactile cues;Slow processing General Comments: pt not verbalizing at all, minimal head nods during session. Pt able to follow single step  commands for mobility with decreased safety awareness      Exercises      General Comments        Pertinent Vitals/Pain Pain Assessment: (CPOT= 0)    Home Living                      Prior Function            PT Goals (current goals can now be found in the care plan section) Progress towards PT goals: Progressing toward goals    Frequency           PT Plan Current plan remains appropriate    Co-evaluation              AM-PAC PT "6 Clicks" Daily Activity  Outcome Measure  Difficulty turning over in bed (including adjusting bedclothes, sheets and blankets)?: Unable Difficulty moving from lying on back to sitting on the side of the bed? : Unable Difficulty sitting down on and standing up from a chair with arms (e.g., wheelchair, bedside commode, etc,.)?: Unable Help needed moving to and from a bed to chair (including a wheelchair)?: Total Help needed walking in hospital room?: Total Help needed climbing 3-5 steps with a railing? : Total 6 Click Score: 6    End of Session Equipment Utilized During Treatment: Gait belt Activity Tolerance: Patient tolerated treatment well Patient left: in chair;with call bell/phone within reach;with chair alarm set;with nursing/sitter in room Nurse Communication: Mobility status;Precautions PT Visit Diagnosis: Other abnormalities of gait and mobility (R26.89);Muscle weakness (generalized) (M62.81);Other symptoms and signs involving the nervous system (R29.898)     Time: 5621-3086 PT Time Calculation (min) (ACUTE ONLY): 24 min  Charges:  $Gait Training: 8-22 mins $Therapeutic Activity: 8-22 mins                    G Codes:       Elwyn Reach, PT [phone]    [place] 09/15/2017, 10:38 AM

## 2017-09-15 NOTE — Progress Notes (Signed)
Pt unable to void after asking several times, bladder scan showed 445ml, straight cath on pt and 330ml was put out. During the straight cath RN noticed white discharge from pts vagina. Will make day shift nurse aware also.

## 2017-09-15 NOTE — Progress Notes (Signed)
Kayla Kent for heparin Indication: atrial fibrillation and stroke  Allergies  Allergen Reactions  . Chocolate Hives  . Codeine Other (See Comments)    Patient states she acts crazy  . Diazepam Other (See Comments)    Patient states she acts crazy.  . Fruit & Vegetable Daily [Nutritional Supplements] Hives and Swelling    peaches  . Iohexol Hives     Code: HIVES, Desc: pt gets 13 hr pre-meds, Onset Date: [id number]   . Latex Hives  . Peach [Prunus Persica] Hives  . Peanut-Containing Drug Products Hives and Swelling  . Penicillins Hives, Itching and Swelling    Has patient had a PCN reaction causing immediate rash, facial/tongue/throat swelling, SOB or lightheadedness with hypotension: Yes Has patient had a PCN reaction causing severe rash involving mucus membranes or skin necrosis: No Has patient had a PCN reaction that required hospitalization No Has patient had a PCN reaction occurring within the last 10 years: No If all of the above answers are "NO", then may proceed with Cephalosporin use.   . Strawberry Extract Hives  . Sulfonamide Derivatives Hives    nausea  . Wheat Shortness Of Breath    Shortness of breath  . Wheat Bran Shortness Of Breath  . Sulfamethoxazole Hives  . Crestor [Rosuvastatin Calcium] Rash  . Iodine Rash    Patient Measurements: Height: 5\' 7"  (170.2 cm) Weight: 107 lb 9.4 oz (48.8 kg) IBW/kg (Calculated) : 61.6  Vital Signs: Temp: 98 F (36.7 C) (11/29 0800) Temp Source: Axillary (11/29 0800) BP: 139/51 (11/29 1029) Pulse Rate: 94 (11/29 1029)  Labs: Recent Labs    09/13/17 0334 09/14/17 0429 09/14/17 2017 09/15/17 0608 09/15/17 1411  HGB 9.0* 9.8*  --  10.3*  --   HCT 27.1* 29.9*  --  31.8*  --   PLT 242 329  --  391  --   LABPROT 14.8  --   --   --   --   INR 1.17  --   --   --   --   HEPARINUNFRC  --   --  0.27* 0.17* 0.26*  CREATININE 0.80 0.74  --   --   --     Estimated Creatinine  Clearance: 41 mL/min (by C-G formula based on SCr of 0.74 mg/dL).   Medical History: Past Medical History:  Diagnosis Date  . Allergic rhinitis   . Anxiety   . CKD (chronic kidney disease) stage 3, GFR 30-59 ml/min (HCC)   . Emphysema of lung ([place])   . HLD (hyperlipidemia)   . HTN (hypertension)   . Intolerance of drug    orthostatic  . Paroxysmal supraventricular tachycardia ([place])   . PVD (peripheral vascular disease) ([place])   . Seizure ([place])   . Syncope and collapse   . Uterine prolapse without mention of vaginal wall prolapse     Medications:  Infusions:  . sodium chloride    . feeding supplement (OSMOLITE 1.2 CAL) 1,000 mL (09/15/17 1220)  . heparin 800 Units/hr (09/15/17 0807)  . niCARDipine Stopped (09/12/17 2322)    Assessment: 47 YOF presented with GI bleed and aspiration pna, eliquis held d/t bleed, then found with R-sided weakness and aphasia >> L-MCA infarct 2/2 afib and no tPA given d/t GI bleed.  Failed swallow 11/28 and will need AC per neuro until eliquis can be administered.    Heparin level remains subtherapeutic.  Goal of Therapy:  Heparin level 0.3-0.5 units/ml Monitor platelets by anticoagulation  protocol: Yes    Plan:  -Increase heparin to 950 units/hr -Daily HL, CBC -Next level with AM labs   Hughes Better, PharmD, BCPS Clinical Pharmacist 09/15/2017 4:29 PM

## 2017-09-15 NOTE — Progress Notes (Signed)
SLP Cancellation Note  Patient Details Name: Kayla Kent MRN: [id number] DOB: Jan 06, 1932   Cancelled treatment:       Reason Eval/Treat Not Completed: Fatigue/lethargy limiting ability to participate   Lynann Beaver 09/15/2017, 10:28 AM

## 2017-09-15 NOTE — Progress Notes (Signed)
STROKE TEAM PROGRESS NOTE   HISTORY OF PRESENT ILLNESS (per record) Kayla Kent is an 83 y.o. female 83 y.o.femalewith medical history significant forstage III chronic kidney disease, dyslipidemia, chronic atrial fibrillation on Eliquis, hypertension, seizure disorder on AEDs, and recent FNA of thyroid nodule.   She was admittedfor GI bleed, and Eliquis was held. Restarted yesterday. Around 6:30 PM he was last seen normal during during shift change 7 PM the patient noted to be aphasic and not moving Kayla right side and also respiratory distress. Stroke alert was called and CT head was obtained immediately did not show any hemorrhage Possible hyperdense MCA sign was noticed on the left side. Patient was allergic to contrast per chart, so CT angiogram was not performed. Patient was pre-medicated with hydrocortisone and Benadryl taken to NG tube for diagnostic angiogram with plan for mechanical thrombectomy if large vessel occlusion was.  Date last known well: 11.25.18 Time last known well: 6.30 pm tPA Given: no, on Eliquis NIHSS: 26 Baseline MRS 0     SUBJECTIVE (INTERVAL HISTORY) Kayla Kent is   present.  She  is sleepy this morning and harder to arouse but recently finished physical therapy She remains aphasic with right hemiplegia. She  failed swallow evaluation yesterday and has panda tube for feeding  OBJECTIVE Temp:  [97.9 F (36.6 C)-98.4 F (36.9 C)] 98 F (36.7 C) (11/29 0800) Pulse Rate:  [28-102] 94 (11/29 1029) Cardiac Rhythm: Atrial fibrillation (11/29 0800) Resp:  [13-22] 16 (11/29 0800) BP: (107-169)/(28-70) 139/51 (11/29 1029) SpO2:  [92 %-100 %] 95 % (11/29 0800) Weight:  [107 lb 9.4 oz (48.8 kg)] 107 lb 9.4 oz (48.8 kg) (11/29 0500)  CBC:  Recent Labs  Lab 09/14/17 0429 09/15/17 0608  WBC 10.0 11.0*  NEUTROABS 8.1* 9.1*  HGB 9.8* 10.3*  HCT 29.9* 31.8*  MCV 93.1 93.3  PLT 329 737    Basic Metabolic Panel:  Recent Labs  Lab 09/13/17 0334  09/14/17 0429 09/14/17 1622 09/15/17 0608  NA 140 142  --   --   K 3.7 3.4*  --   --   CL 108 105  --   --   CO2 25 25  --   --   GLUCOSE 80 66  --   --   BUN 29* 28*  --   --   CREATININE 0.80 0.74  --   --   CALCIUM 7.7* 7.9*  --   --   MG  --   --  2.1 2.2  PHOS  --   --  2.6 2.6    Lipid Panel:     Component Value Date/Time   CHOL 132 10/30/2015 0931   TRIG 99 09/14/2017 2315   TRIG 141 10/25/2006 0906   HDL 33 (L) 10/30/2015 0931   CHOLHDL 4.0 10/30/2015 0931   VLDL 14 10/30/2015 0931   LDLCALC 85 10/30/2015 0931   HgbA1c:  Lab Results  Component Value Date   HGBA1C 5.5 09/13/2017   Urine Drug Screen:     Component Value Date/Time   LABOPIA NONE DETECTED 04/06/2017 1944   COCAINSCRNUR NONE DETECTED 04/06/2017 1944   LABBENZ POSITIVE (A) 04/06/2017 1944   AMPHETMU NONE DETECTED 04/06/2017 1944   THCU NONE DETECTED 04/06/2017 1944   LABBARB NONE DETECTED 04/06/2017 1944    Alcohol Level     Component Value Date/Time   ETH <5 04/06/2017 1907    IMAGING   Ct Head Wo Contrast 09/11/2017 IMPRESSION:  1. Small focus  of contrast staining versus petechial hemorrhage at the medial aspect of the left frontal operculum.  2. No intraparenchymal hematoma, mass effect or hydrocephalus.     Ct Chest Wo Contrast 09/11/2017 IMPRESSION:  1. Findings are most compatible with severe multilobar bronchopneumonia.  2. Moderate bilateral pleural effusions with areas of dependent atelectasis in the lower lobes of lungs bilaterally.  3. Cardiomegaly.  4. Aortic atherosclerosis, in addition to left main and 3 vessel coronary artery disease.  5. Previously treated right upper lobe pulmonary nodule is now obscured by overlying airspace disease. Attention on followup studies is recommended.  Aortic Atherosclerosis (ICD10-I70.0).     Dg Chest Port 1 View 09/12/2017 IMPRESSION:  Appliances appear in satisfactory position. Again, there is diffuse bilateral pulmonary  infiltration with small left pleural effusion. Changes likely represent multifocal pneumonia, ARDS, or edema.    Dg Chest Port 1 View 09/11/2017 IMPRESSION:  1. Findings are compatible with worsening severe multilobar pneumonia, most evident throughout the mid to lower lungs bilaterally.  2. Small right and moderate left parapneumonic pleural effusions.  3. Cardiomegaly.  4. Aortic atherosclerosis.    Ct Head Code Stroke Wo Contrast 09/11/2017 IMPRESSION:  1. No acute intracranial abnormality identified.  2. Stable right anterior temporal lobe encephalomalacia, chronic microvascular ischemic changes, and parenchymal volume loss of the brain.  3. ASPECTS is 10    Cerbral  Angiogram - Dr. Estanislado Pandy 09/11/2017 S/P mLt common carotid arteriogram,followed by complete revascularization of occluded lt MCA distal inferior division   With x 2 passes with solitaire 38mm x 40 mm FR retrieval device, and 4.5 mg of IA INTEGRELIN IA achieving a  TICI 3 reperfusion.  MRI Brain :  Acute ischemic infarct within the left hemisphere, predominantly located within the left precentral gyrus and frontal operculum, but also involving the left postcentral gyrus.  Transthoracic Echocardiogram  04/08/2017 Study Conclusions  - Left ventricle: The cavity size was normal. Wall thickness was   normal. Systolic function was normal. The estimated ejection   fraction was in the range of 55% to 60%. Doppler parameters are   consistent with both elevated ventricular end-diastolic filling   pressure and elevated left atrial filling pressure. - Aortic valve: There was trivial regurgitation. - Mitral valve: There was mild regurgitation. Valve area by   continuity equation (using LVOT flow): 1.22 cm^2. - Right atrium: The atrium was mildly dilated. - Atrial septum: There was a patent foramen ovale. - Pulmonary arteries: PA peak pressure: 49 mm Hg (S). - Pericardium, extracardiac: A trivial pericardial effusion  was   identified.   Right LE Venous Dopplers 04/09/2017 No evidence of deep vein thrombosis involving the right lower extremity.      PHYSICAL EXAM Vitals:   09/15/17 0600 09/15/17 0700 09/15/17 0800 09/15/17 1029  BP: (!) 148/49 (!) 129/44 126/61 (!) 139/51  Pulse: 89 (!) 102 66 94  Resp: 18 18 16    Temp:   98 F (36.7 C)   TempSrc:   Axillary   SpO2: 92% 92% 95%   Weight:      Height:       Frail elderly caucasian lady not in distress . Afebrile. Head is nontraumatic. Neck is supple without bruit.    Cardiac exam no murmur or gallop. Lungs are clear to auscultation. Distal pulses are well felt. Neurological Exam :  Lethargic. Can be aroused but cannot hold attention.. Mild expressive and receptive aphasia. Speaks only a few words. Follows simple midline and one-step commands only. Left gaze  deviation improved. Mild exotropia right eye ( chronic per family). Unable to look to the right and cross midline. Blinks to threat on the left but not on the right. Mild right lower facial weakness. Tongue midline. Follows  midline and 1 step commands on the left. Right upper extremity is   weak but now able to move it against gravity. Able to move right lower extremity  but has mild weakness. Purposeful antigravity movement on the left side. Deep tendon reflexes are depressed in the right and normal on the left. Right plantar upgoing left downgoing.  ASSESSMENT/PLAN Kayla Kent is a 83 y.o. female with history of syncope, seizure disorder, PFO from echo 04/08/2017, peripheral vascular disease, atrial fibrillation on Eliquis, recent GI bleed, hypertension, recent thyroid biopsy, hyperlipidemia, emphysema, chronic kidney disease, and anxiety presenting with right-sided weakness, aphasia, and respiratory distress. She did not receive IV t-PA due to anticoagulation.  Cerbral  Angiogram / Intervention - Dr. Estanislado Pandy 09/11/2017 S/P mLt common carotid arteriogram,followed by complete  revascularization of occluded lt MCA distal inferior division   With x 2 passes with solitaire 28mm x 40 mm FR retrieval device, and 4.5 mg of IA INTEGRELIN IA achieving a  TICI 3 reperfusion.  Stroke:  Left middle cerebral artery - embolic secondary to atrial fibrillation. Eliquis recently held for GI bleed.  Resultant  Mild aphasia, right visual field loss and right hemiplegia  CT head - Small focus of contrast staining vs petechial hemorrhage at the medial aspect of the lt frontal operculum.   MRI head - - not performed  MRA head - not performed  Carotid Doppler - cerebral angiogram  2D Echo - 04/08/2017 - EF 55-60% - PFO noted  LDL - 85  HgbA1c - 5.5  VTE prophylaxis - SCDs Diet NPO time specified  Eliquis (apixaban) daily prior to admission, now on No antithrombotic  Patient will be counseled to be compliant with Kayla antithrombotic medications  Ongoing aggressive stroke risk factor management  Therapy recommendations:  pending  Disposition:  Pending  Hypertension  Stable  Permissive hypertension (OK if < 220/120) but gradually normalize in 5-7 days  Long-term BP goal normotensive  Hyperlipidemia  Home meds:  Lipitor 10 mg daily not resumed in hospital  LDL 85, goal < 70  Increase Lipitor to 20 mg daily  Continue statin at discharge   Other Stroke Risk Factors  Advanced age  Former cigarette smoker - quit 23 years ago  PFO  Afib on Eliquis (held for GI bleed)  Other Active Problems  Multi lobar pneumonia  Anemia - 10 / 29.5  CKD  Contrast allergy  Seizure disorder - Keppra    Plan / Recommendations     Continue IV heparin drip  I had a long discussion with the patient's Kent and discussed the risk-benefit of anticoagulation versus bleeding risk. Since she   had an embolic stroke when eliquis was on hold she needs to restart anticoagulation soon. Family understands increased risk for bleeding and is agreeable with the plan to start  anticoagulation Hospital day # 9  I have personally examined this patient, reviewed notes, independently viewed imaging studies, participated in medical decision making and plan of care.ROS completed by me personally and pertinent positives fully documented  I have made any additions or clarifications directly to the above . Check repeat CBC, BMP and chest x-ray tomorrow morning and UA today. . Discussed with Dr. Sabra Heck.and   This patient is critically ill and at significant risk  of neurological worsening, death and care requires constant monitoring of vital signs, hemodynamics,respiratory and cardiac monitoring, extensive review of multiple databases, frequent neurological assessment, discussion with family, other specialists and medical decision making of high complexity.I have made any additions or clarifications directly to the above note.This critical care time does not reflect procedure time, or teaching time or supervisory time of PA/NP/Med Resident etc but could involve care discussion time.  I spent 32 minutes of neurocritical care time  in the care of  this patient.     Antony Contras, MD Medical Director [facility] Pager: [phone] 09/15/2017 2:22 PM   To contact Stroke Continuity provider, please refer to [email or web address] After hours, contact General Neurology

## 2017-09-15 NOTE — Progress Notes (Signed)
Around 0900, the patient was being transferred from the bed to the chair. Upon moving the patient, a plastic cap about 3cm in diameter was found sticking to the patient's skin. The cap was removed and a red indention was left behind. From what I can gather, it appears the cap came from a specimen cup that comes with an I/O cath kit. Upon finding the cap, an assessment was done and the patient's son, unit charge RN, attending Md, and Chief Financial Officer were notified. The [place] RN was on the floor at that time and was able to assess the wound with this RN. A stage 1 was given to this injury. A foam dressing was placed over the area, documented in EPIC and frequently repositioned throughout the day.

## 2017-09-15 NOTE — Progress Notes (Signed)
[place] for heparin Indication: atrial fibrillation and stroke  Allergies  Allergen Reactions  . Chocolate Hives  . Codeine Other (See Comments)    Patient states she acts crazy  . Diazepam Other (See Comments)    Patient states she acts crazy.  . Fruit & Vegetable Daily [Nutritional Supplements] Hives and Swelling    peaches  . Iohexol Hives     Code: HIVES, Desc: pt gets 13 hr pre-meds, Onset Date: [id number]   . Latex Hives  . Peach [Prunus Persica] Hives  . Peanut-Containing Drug Products Hives and Swelling  . Penicillins Hives, Itching and Swelling    Has patient had a PCN reaction causing immediate rash, facial/tongue/throat swelling, SOB or lightheadedness with hypotension: Yes Has patient had a PCN reaction causing severe rash involving mucus membranes or skin necrosis: No Has patient had a PCN reaction that required hospitalization No Has patient had a PCN reaction occurring within the last 10 years: No If all of the above answers are "NO", then may proceed with Cephalosporin use.   . Strawberry Extract Hives  . Sulfonamide Derivatives Hives    nausea  . Wheat Shortness Of Breath    Shortness of breath  . Wheat Bran Shortness Of Breath  . Sulfamethoxazole Hives  . Crestor [Rosuvastatin Calcium] Rash  . Iodine Rash    Patient Measurements: Height: 5\' 7"  (170.2 cm) Weight: 107 lb 9.4 oz (48.8 kg) IBW/kg (Calculated) : 61.6  Vital Signs: Temp: 98.2 F (36.8 C) (11/29 0400) Temp Source: Oral (11/29 0400) BP: 129/44 (11/29 0700) Pulse Rate: 102 (11/29 0700)  Labs: Recent Labs    09/13/17 0334 09/14/17 0429 09/14/17 2017 09/15/17 0608  HGB 9.0* 9.8*  --  10.3*  HCT 27.1* 29.9*  --  31.8*  PLT 242 329  --  391  LABPROT 14.8  --   --   --   INR 1.17  --   --   --   HEPARINUNFRC  --   --  0.27* 0.17*  CREATININE 0.80 0.74  --   --     Estimated Creatinine Clearance: 41 mL/min (by C-G formula based on SCr of 0.74  mg/dL).  Assessment: 83 yo female admitted with CVA< h/o Afib and Eliquis on hold, for heparin  Goal of Therapy:  Heparin level 0.3-0.5 units/ml Monitor platelets by anticoagulation protocol: Yes   Plan:  Increase Heparin 800/hr Check heparin level in 8 hours.  Phillis Knack, PharmD, BCPS  09/15/2017,7:35 AM

## 2017-09-16 ENCOUNTER — Encounter (HOSPITAL_COMMUNITY): Payer: Self-pay | Admitting: *Deleted

## 2017-09-16 ENCOUNTER — Inpatient Hospital Stay (HOSPITAL_COMMUNITY)
Admission: RE | Admit: 2017-09-16 | Discharge: 2017-09-19 | DRG: 057 | Disposition: A | Discharge status: Other Acute Inpatient Hospital | Payer: Medicare Other | Source: Intra-hospital | Attending: Physical Medicine & Rehabilitation | Admitting: Physical Medicine & Rehabilitation

## 2017-09-16 ENCOUNTER — Inpatient Hospital Stay (HOSPITAL_COMMUNITY): Payer: Medicare Other

## 2017-09-16 ENCOUNTER — Other Ambulatory Visit: Payer: Self-pay

## 2017-09-16 DIAGNOSIS — Z880 Allergy status to penicillin: Secondary | ICD-10-CM

## 2017-09-16 DIAGNOSIS — I471 Supraventricular tachycardia: Secondary | ICD-10-CM | POA: Diagnosis present

## 2017-09-16 DIAGNOSIS — R400 Somnolence: Secondary | ICD-10-CM | POA: Diagnosis not present

## 2017-09-16 DIAGNOSIS — Z885 Allergy status to narcotic agent status: Secondary | ICD-10-CM

## 2017-09-16 DIAGNOSIS — J9601 Acute respiratory failure with hypoxia: Secondary | ICD-10-CM | POA: Diagnosis not present

## 2017-09-16 DIAGNOSIS — I5032 Chronic diastolic (congestive) heart failure: Secondary | ICD-10-CM | POA: Diagnosis present

## 2017-09-16 DIAGNOSIS — G40909 Epilepsy, unspecified, not intractable, without status epilepticus: Secondary | ICD-10-CM | POA: Diagnosis not present

## 2017-09-16 DIAGNOSIS — D649 Anemia, unspecified: Secondary | ICD-10-CM | POA: Diagnosis present

## 2017-09-16 DIAGNOSIS — J449 Chronic obstructive pulmonary disease, unspecified: Secondary | ICD-10-CM | POA: Diagnosis present

## 2017-09-16 DIAGNOSIS — I10 Essential (primary) hypertension: Secondary | ICD-10-CM | POA: Diagnosis present

## 2017-09-16 DIAGNOSIS — R4701 Aphasia: Secondary | ICD-10-CM

## 2017-09-16 DIAGNOSIS — I69391 Dysphagia following cerebral infarction: Secondary | ICD-10-CM

## 2017-09-16 DIAGNOSIS — N183 Chronic kidney disease, stage 3 unspecified: Secondary | ICD-10-CM | POA: Diagnosis present

## 2017-09-16 DIAGNOSIS — I482 Chronic atrial fibrillation, unspecified: Secondary | ICD-10-CM | POA: Diagnosis present

## 2017-09-16 DIAGNOSIS — I69351 Hemiplegia and hemiparesis following cerebral infarction affecting right dominant side: Secondary | ICD-10-CM | POA: Diagnosis not present

## 2017-09-16 DIAGNOSIS — K2970 Gastritis, unspecified, without bleeding: Secondary | ICD-10-CM | POA: Diagnosis present

## 2017-09-16 DIAGNOSIS — J9690 Respiratory failure, unspecified, unspecified whether with hypoxia or hypercapnia: Secondary | ICD-10-CM

## 2017-09-16 DIAGNOSIS — Z888 Allergy status to other drugs, medicaments and biological substances status: Secondary | ICD-10-CM

## 2017-09-16 DIAGNOSIS — I480 Paroxysmal atrial fibrillation: Secondary | ICD-10-CM | POA: Diagnosis present

## 2017-09-16 DIAGNOSIS — Z91040 Latex allergy status: Secondary | ICD-10-CM

## 2017-09-16 DIAGNOSIS — I509 Heart failure, unspecified: Secondary | ICD-10-CM | POA: Diagnosis present

## 2017-09-16 DIAGNOSIS — I63512 Cerebral infarction due to unspecified occlusion or stenosis of left middle cerebral artery: Secondary | ICD-10-CM

## 2017-09-16 DIAGNOSIS — R1310 Dysphagia, unspecified: Secondary | ICD-10-CM

## 2017-09-16 DIAGNOSIS — I130 Hypertensive heart and chronic kidney disease with heart failure and stage 1 through stage 4 chronic kidney disease, or unspecified chronic kidney disease: Secondary | ICD-10-CM | POA: Diagnosis present

## 2017-09-16 DIAGNOSIS — E872 Acidosis: Secondary | ICD-10-CM | POA: Diagnosis not present

## 2017-09-16 DIAGNOSIS — Z87891 Personal history of nicotine dependence: Secondary | ICD-10-CM

## 2017-09-16 DIAGNOSIS — I69320 Aphasia following cerebral infarction: Secondary | ICD-10-CM

## 2017-09-16 DIAGNOSIS — R1312 Dysphagia, oropharyngeal phase: Secondary | ICD-10-CM

## 2017-09-16 DIAGNOSIS — J439 Emphysema, unspecified: Secondary | ICD-10-CM | POA: Diagnosis present

## 2017-09-16 DIAGNOSIS — E785 Hyperlipidemia, unspecified: Secondary | ICD-10-CM | POA: Diagnosis present

## 2017-09-16 DIAGNOSIS — D62 Acute posthemorrhagic anemia: Secondary | ICD-10-CM | POA: Diagnosis present

## 2017-09-16 DIAGNOSIS — K922 Gastrointestinal hemorrhage, unspecified: Secondary | ICD-10-CM | POA: Diagnosis not present

## 2017-09-16 DIAGNOSIS — Z882 Allergy status to sulfonamides status: Secondary | ICD-10-CM

## 2017-09-16 DIAGNOSIS — R0602 Shortness of breath: Secondary | ICD-10-CM

## 2017-09-16 DIAGNOSIS — D500 Iron deficiency anemia secondary to blood loss (chronic): Secondary | ICD-10-CM | POA: Diagnosis not present

## 2017-09-16 DIAGNOSIS — Z91010 Allergy to peanuts: Secondary | ICD-10-CM

## 2017-09-16 DIAGNOSIS — N179 Acute kidney failure, unspecified: Secondary | ICD-10-CM | POA: Diagnosis present

## 2017-09-16 DIAGNOSIS — E041 Nontoxic single thyroid nodule: Secondary | ICD-10-CM | POA: Diagnosis present

## 2017-09-16 DIAGNOSIS — Z8719 Personal history of other diseases of the digestive system: Secondary | ICD-10-CM

## 2017-09-16 LAB — BASIC METABOLIC PANEL
Anion gap: 4 — ABNORMAL LOW (ref 5–15)
BUN: 27 mg/dL — AB (ref 6–20)
CHLORIDE: 110 mmol/L (ref 101–111)
CO2: 28 mmol/L (ref 22–32)
Calcium: 7.7 mg/dL — ABNORMAL LOW (ref 8.9–10.3)
Creatinine, Ser: 0.57 mg/dL (ref 0.44–1.00)
GFR calc Af Amer: >60 mL/min (ref >60)
GFR calc non Af Amer: >60 mL/min (ref >60)
GLUCOSE: 138 mg/dL — AB (ref 65–99)
POTASSIUM: 4.3 mmol/L (ref 3.5–5.1)
SODIUM: 142 mmol/L (ref 135–145)

## 2017-09-16 LAB — CBC WITH DIFFERENTIAL/PLATELET
Basophils Absolute: 0 10*3/uL (ref 0.0–0.1)
Basophils Relative: 0 %
EOS PCT: 1 %
Eosinophils Absolute: 0.1 10*3/uL (ref 0.0–0.7)
HCT: 30 % — ABNORMAL LOW (ref 36.0–46.0)
Hemoglobin: 9.6 g/dL — ABNORMAL LOW (ref 12.0–15.0)
LYMPHS ABS: 1.4 10*3/uL (ref 0.7–4.0)
LYMPHS PCT: 14 %
MCH: 30.1 pg (ref 26.0–34.0)
MCHC: 32 g/dL (ref 30.0–36.0)
MCV: 94 fL (ref 78.0–100.0)
MONO ABS: 0.9 10*3/uL (ref 0.1–1.0)
MONOS PCT: 9 %
Neutro Abs: 8 10*3/uL — ABNORMAL HIGH (ref 1.7–7.7)
Neutrophils Relative %: 76 %
PLATELETS: 417 10*3/uL — AB (ref 150–400)
RBC: 3.19 MIL/uL — ABNORMAL LOW (ref 3.87–5.11)
RDW: 15.5 % (ref 11.5–15.5)
WBC: 10.4 10*3/uL (ref 4.0–10.5)

## 2017-09-16 LAB — CULTURE, BLOOD (ROUTINE X 2)
CULTURE: NO GROWTH
Culture: NO GROWTH
Special Requests: ADEQUATE

## 2017-09-16 LAB — GLUCOSE, CAPILLARY
GLUCOSE-CAPILLARY: 168 mg/dL — AB (ref 65–99)
GLUCOSE-CAPILLARY: 165 mg/dL — AB (ref 65–99)
GLUCOSE-CAPILLARY: 149 mg/dL — AB (ref 65–99)
Glucose-Capillary: 163 mg/dL — ABNORMAL HIGH (ref 65–99)
Glucose-Capillary: 153 mg/dL — ABNORMAL HIGH (ref 65–99)

## 2017-09-16 LAB — MAGNESIUM: Magnesium: 2.4 mg/dL (ref 1.7–2.4)

## 2017-09-16 LAB — PHOSPHORUS: Phosphorus: 2.2 mg/dL — ABNORMAL LOW (ref 2.5–4.6)

## 2017-09-16 LAB — HEPARIN LEVEL (UNFRACTIONATED)
Heparin Unfractionated: 0.51 IU/mL (ref 0.30–0.70)
Heparin Unfractionated: 0.55 IU/mL (ref 0.30–0.70)

## 2017-09-16 MED ORDER — DIPHENHYDRAMINE HCL 12.5 MG/5ML PO ELIX: 12.5000 mg | ORAL_SOLUTION | Freq: Four times a day (QID) | ORAL | Status: DC | PRN

## 2017-09-16 MED ORDER — LEVETIRACETAM 100 MG/ML PO SOLN
250.0000 mg | Freq: Two times a day (BID) | ORAL | Status: DC
  Administered 2017-09-16 – 2017-09-18 (×5): 250 mg
  Filled 2017-09-16 (×5): qty 5

## 2017-09-16 MED ORDER — HEPARIN (PORCINE) IN NACL 100-0.45 UNIT/ML-% IJ SOLN: 900.0000 [IU]/h | INTRAMUSCULAR | Status: DC

## 2017-09-16 MED ORDER — PROCHLORPERAZINE MALEATE 5 MG PO TABS: 5.0000 mg | ORAL_TABLET | Freq: Four times a day (QID) | ORAL | Status: DC | PRN

## 2017-09-16 MED ORDER — CHLORHEXIDINE GLUCONATE 0.12% ORAL RINSE (MEDLINE KIT): 15.0000 mL | Freq: Two times a day (BID) | OROMUCOSAL | 0 refills | Status: DC

## 2017-09-16 MED ORDER — GUAIFENESIN-DM 100-10 MG/5ML PO SYRP
5.0000 mL | ORAL_SOLUTION | Freq: Four times a day (QID) | ORAL | Status: DC | PRN
  Administered 2017-09-17: 10 mL via ORAL
  Filled 2017-09-16: qty 10

## 2017-09-16 MED ORDER — PROCHLORPERAZINE 25 MG RE SUPP
12.5000 mg | Freq: Four times a day (QID) | RECTAL | Status: DC | PRN
  Filled 2017-09-16: qty 1

## 2017-09-16 MED ORDER — IPRATROPIUM-ALBUTEROL 0.5-2.5 (3) MG/3ML IN SOLN: 3.0000 mL | RESPIRATORY_TRACT | Status: DC | PRN

## 2017-09-16 MED ORDER — OSMOLITE 1.2 CAL PO LIQD
1000.0000 mL | ORAL | Status: DC
  Administered 2017-09-16 – 2017-09-18 (×3): 1000 mL
  Filled 2017-09-16 (×8): qty 1000

## 2017-09-16 MED ORDER — CLONIDINE HCL 0.1 MG PO TABS
0.1000 mg | ORAL_TABLET | ORAL | Status: DC | PRN
  Administered 2017-09-18: 0.1 mg via ORAL
  Filled 2017-09-16 (×2): qty 1

## 2017-09-16 MED ORDER — PANTOPRAZOLE SODIUM 40 MG PO PACK: 40.0000 mg | PACK | Freq: Every day | ORAL | Status: DC

## 2017-09-16 MED ORDER — PROCHLORPERAZINE EDISYLATE 5 MG/ML IJ SOLN: 5.0000 mg | Freq: Four times a day (QID) | INTRAMUSCULAR | Status: DC | PRN

## 2017-09-16 MED ORDER — POLYETHYLENE GLYCOL 3350 17 G PO PACK: 17.0000 g | PACK | Freq: Every day | ORAL | Status: DC | PRN

## 2017-09-16 MED ORDER — ALUM & MAG HYDROXIDE-SIMETH 200-200-20 MG/5ML PO SUSP: 30.0000 mL | ORAL | Status: DC | PRN

## 2017-09-16 MED ORDER — SENNOSIDES-DOCUSATE SODIUM 8.6-50 MG PO TABS: 1.0000 | ORAL_TABLET | Freq: Every day | ORAL | Status: DC

## 2017-09-16 MED ORDER — BISACODYL 10 MG RE SUPP: 10.0000 mg | Freq: Every day | RECTAL | Status: DC | PRN

## 2017-09-16 MED ORDER — HEPARIN (PORCINE) IN NACL 100-0.45 UNIT/ML-% IJ SOLN
1100.0000 [IU]/h | INTRAMUSCULAR | Status: DC
  Administered 2017-09-16 – 2017-09-17 (×2): 850 [IU]/h via INTRAVENOUS
  Administered 2017-09-19: 1000 [IU]/h via INTRAVENOUS
  Filled 2017-09-16 (×3): qty 250

## 2017-09-16 MED ORDER — SODIUM CHLORIDE 0.9 % IV SOLN: 250.0000 mL | INTRAVENOUS | 0 refills | Status: DC | PRN

## 2017-09-16 MED ORDER — SALINE SPRAY 0.65 % NA SOLN: 1.0000 | NASAL | 0 refills | Status: DC | PRN

## 2017-09-16 MED ORDER — ACETAMINOPHEN 325 MG PO TABS: 325.0000 mg | ORAL_TABLET | ORAL | Status: DC | PRN

## 2017-09-16 MED ORDER — CHLORHEXIDINE GLUCONATE 0.12% ORAL RINSE (MEDLINE KIT)
15.0000 mL | Freq: Two times a day (BID) | OROMUCOSAL | Status: DC
  Administered 2017-09-16 – 2017-09-19 (×6): 15 mL via OROMUCOSAL

## 2017-09-16 MED ORDER — ATORVASTATIN CALCIUM 20 MG PO TABS
20.0000 mg | ORAL_TABLET | Freq: Every day | ORAL | Status: DC
  Administered 2017-09-16 – 2017-09-17 (×2): 20 mg via ORAL
  Filled 2017-09-16 (×2): qty 1

## 2017-09-16 MED ORDER — FLEET ENEMA 7-19 GM/118ML RE ENEM: 1.0000 | ENEMA | Freq: Once | RECTAL | Status: DC | PRN

## 2017-09-16 MED ORDER — ACETAMINOPHEN 325 MG PO TABS: 650.0000 mg | ORAL_TABLET | ORAL | Status: DC | PRN

## 2017-09-16 MED ORDER — ASPIRIN 300 MG RE SUPP
300.0000 mg | Freq: Every day | RECTAL | Status: DC
  Administered 2017-09-17 – 2017-09-18 (×2): 300 mg via RECTAL
  Filled 2017-09-16 (×2): qty 1

## 2017-09-16 MED ORDER — OSMOLITE 1.2 CAL PO LIQD: 1000.0000 mL | ORAL | 0 refills | Status: DC

## 2017-09-16 MED ORDER — IPRATROPIUM-ALBUTEROL 0.5-2.5 (3) MG/3ML IN SOLN
3.0000 mL | RESPIRATORY_TRACT | Status: DC | PRN
  Administered 2017-09-19 (×2): 3 mL via RESPIRATORY_TRACT
  Filled 2017-09-16: qty 3

## 2017-09-16 MED ORDER — PANTOPRAZOLE SODIUM 40 MG PO PACK
40.0000 mg | PACK | Freq: Every day | ORAL | Status: DC
  Administered 2017-09-18: 40 mg

## 2017-09-16 MED ORDER — INSULIN ASPART 100 UNIT/ML ~~LOC~~ SOLN
0.0000 [IU] | SUBCUTANEOUS | Status: DC
  Administered 2017-09-16 – 2017-09-19 (×6): 1 [IU] via SUBCUTANEOUS

## 2017-09-16 MED ORDER — FREE WATER
100.0000 mL | Freq: Three times a day (TID) | Status: DC
  Administered 2017-09-16 – 2017-09-18 (×8): 100 mL

## 2017-09-16 MED ORDER — LEVETIRACETAM 100 MG/ML PO SOLN: 250.0000 mg | Freq: Two times a day (BID) | ORAL | 12 refills | Status: DC

## 2017-09-16 MED ORDER — TRAZODONE HCL 50 MG PO TABS
25.0000 mg | ORAL_TABLET | Freq: Every evening | ORAL | Status: DC | PRN
  Administered 2017-09-17 – 2017-09-18 (×2): 50 mg via ORAL
  Filled 2017-09-16 (×2): qty 1

## 2017-09-16 MED ORDER — SALINE SPRAY 0.65 % NA SOLN
1.0000 | NASAL | Status: DC | PRN
  Filled 2017-09-16: qty 44

## 2017-09-16 MED ORDER — ORAL CARE MOUTH RINSE
15.0000 mL | Freq: Two times a day (BID) | OROMUCOSAL | Status: DC
  Administered 2017-09-17 – 2017-09-19 (×5): 15 mL via OROMUCOSAL

## 2017-09-16 MED ORDER — SENNOSIDES-DOCUSATE SODIUM 8.6-50 MG PO TABS
1.0000 | ORAL_TABLET | Freq: Every day | ORAL | Status: DC
  Administered 2017-09-16 – 2017-09-18 (×2): 1 via ORAL
  Filled 2017-09-16 (×2): qty 1

## 2017-09-16 MED ORDER — ATORVASTATIN CALCIUM 20 MG PO TABS: 20.0000 mg | ORAL_TABLET | Freq: Every day | ORAL | Status: DC

## 2017-09-16 MED ORDER — ASPIRIN 300 MG RE SUPP: 300.0000 mg | Freq: Every day | RECTAL | 0 refills | Status: DC

## 2017-09-16 MED ORDER — BISACODYL 10 MG RE SUPP: 10.0000 mg | Freq: Every day | RECTAL | 0 refills | Status: AC | PRN

## 2017-09-16 NOTE — Progress Notes (Signed)
STROKE TEAM PROGRESS NOTE   HISTORY OF PRESENT ILLNESS (per record) Kayla Kent is an 83 y.o. female 83 y.o.femalewith medical history significant forstage III chronic kidney disease, dyslipidemia, chronic atrial fibrillation on Eliquis, hypertension, seizure disorder on AEDs, and recent FNA of thyroid nodule.   She was admittedfor GI bleed, and Eliquis was held. Restarted yesterday. Around 6:30 PM he was last seen normal during during shift change 7 PM the patient noted to be aphasic and not moving her right side and also respiratory distress. Stroke alert was called and CT head was obtained immediately did not show any hemorrhage Possible hyperdense MCA sign was noticed on the left side. Patient was allergic to contrast per chart, so CT angiogram was not performed. Patient was pre-medicated with hydrocortisone and Benadryl taken to NG tube for diagnostic angiogram with plan for mechanical thrombectomy if large vessel occlusion was.  Date last known well: 11.25.18 Time last known well: 6.30 pm tPA Given: no, on Eliquis NIHSS: 26 Baseline MRS 0     SUBJECTIVE (INTERVAL HISTORY)    She  is more alert and interactive this morning but She remains aphasic with right hemiplegia. She  failed swallow evaluation yesterday and has panda tube for feeding  OBJECTIVE Temp:  [98.3 F (36.8 C)-98.7 F (37.1 C)] 98.3 F (36.8 C) (11/30 1200) Pulse Rate:  [75-157] 85 (11/30 1400) Cardiac Rhythm: Atrial fibrillation (11/30 0800) Resp:  [14-24] 17 (11/30 1400) BP: (111-155)/(46-79) 151/66 (11/30 1400) SpO2:  [92 %-98 %] 98 % (11/30 1400) Weight:  [108 lb 0.4 oz (49 kg)] 108 lb 0.4 oz (49 kg) (11/30 0459)  CBC:  Recent Labs  Lab 09/15/17 0608 09/16/17 0430  WBC 11.0* 10.4  NEUTROABS 9.1* 8.0*  HGB 10.3* 9.6*  HCT 31.8* 30.0*  MCV 93.3 94.0  PLT 391 417*    Basic Metabolic Panel:  Recent Labs  Lab 09/14/17 0429  09/15/17 1626 09/16/17 0430  NA 142  --   --  142  K 3.4*  --    --  4.3  CL 105  --   --  110  CO2 25  --   --  28  GLUCOSE 66  --   --  138*  BUN 28*  --   --  27*  CREATININE 0.74  --   --  0.57  CALCIUM 7.9*  --   --  7.7*  MG  --    < > 2.2 2.4  PHOS  --    < > 2.2* 2.2*   < > = values in this interval not displayed.    Lipid Panel:     Component Value Date/Time   CHOL 132 10/30/2015 0931   TRIG 99 09/14/2017 2315   TRIG 141 10/25/2006 0906   HDL 33 (L) 10/30/2015 0931   CHOLHDL 4.0 10/30/2015 0931   VLDL 14 10/30/2015 0931   LDLCALC 85 10/30/2015 0931   HgbA1c:  Lab Results  Component Value Date   HGBA1C 5.5 09/13/2017   Urine Drug Screen:     Component Value Date/Time   LABOPIA NONE DETECTED 04/06/2017 1944   COCAINSCRNUR NONE DETECTED 04/06/2017 1944   LABBENZ POSITIVE (A) 04/06/2017 1944   AMPHETMU NONE DETECTED 04/06/2017 1944   THCU NONE DETECTED 04/06/2017 1944   LABBARB NONE DETECTED 04/06/2017 1944    Alcohol Level     Component Value Date/Time   ETH <5 04/06/2017 1907    IMAGING   Ct Head Wo Contrast 09/11/2017 IMPRESSION:  1. Small focus of contrast staining versus petechial hemorrhage at the medial aspect of the left frontal operculum.  2. No intraparenchymal hematoma, mass effect or hydrocephalus.     Ct Chest Wo Contrast 09/11/2017 IMPRESSION:  1. Findings are most compatible with severe multilobar bronchopneumonia.  2. Moderate bilateral pleural effusions with areas of dependent atelectasis in the lower lobes of lungs bilaterally.  3. Cardiomegaly.  4. Aortic atherosclerosis, in addition to left main and 3 vessel coronary artery disease.  5. Previously treated right upper lobe pulmonary nodule is now obscured by overlying airspace disease. Attention on followup studies is recommended.  Aortic Atherosclerosis (ICD10-I70.0).     Dg Chest Port 1 View 09/12/2017 IMPRESSION:  Appliances appear in satisfactory position. Again, there is diffuse bilateral pulmonary infiltration with small left  pleural effusion. Changes likely represent multifocal pneumonia, ARDS, or edema.    Dg Chest Port 1 View 09/11/2017 IMPRESSION:  1. Findings are compatible with worsening severe multilobar pneumonia, most evident throughout the mid to lower lungs bilaterally.  2. Small right and moderate left parapneumonic pleural effusions.  3. Cardiomegaly.  4. Aortic atherosclerosis.    Ct Head Code Stroke Wo Contrast 09/11/2017 IMPRESSION:  1. No acute intracranial abnormality identified.  2. Stable right anterior temporal lobe encephalomalacia, chronic microvascular ischemic changes, and parenchymal volume loss of the brain.  3. ASPECTS is 10    Cerbral  Angiogram - Dr. Estanislado Pandy 09/11/2017 S/P mLt common carotid arteriogram,followed by complete revascularization of occluded lt MCA distal inferior division   With x 2 passes with solitaire 17mm x 40 mm FR retrieval device, and 4.5 mg of IA INTEGRELIN IA achieving a  TICI 3 reperfusion.  MRI Brain :  Acute ischemic infarct within the left hemisphere, predominantly located within the left precentral gyrus and frontal operculum, but also involving the left postcentral gyrus.  Transthoracic Echocardiogram  04/08/2017 Study Conclusions  - Left ventricle: The cavity size was normal. Wall thickness was   normal. Systolic function was normal. The estimated ejection   fraction was in the range of 55% to 60%. Doppler parameters are   consistent with both elevated ventricular end-diastolic filling   pressure and elevated left atrial filling pressure. - Aortic valve: There was trivial regurgitation. - Mitral valve: There was mild regurgitation. Valve area by   continuity equation (using LVOT flow): 1.22 cm^2. - Right atrium: The atrium was mildly dilated. - Atrial septum: There was a patent foramen ovale. - Pulmonary arteries: PA peak pressure: 49 mm Hg (S). - Pericardium, extracardiac: A trivial pericardial effusion was   identified.   Right  LE Venous Dopplers 04/09/2017 No evidence of deep vein thrombosis involving the right lower extremity.      PHYSICAL EXAM Vitals:   09/16/17 1100 09/16/17 1200 09/16/17 1300 09/16/17 1400  BP: (!) 154/55   (!) 151/66  Pulse:  80 85 85  Resp: (!) 21 (!) 21 (!) 22 17  Temp:  98.3 F (36.8 C)    TempSrc:  Axillary    SpO2:  95% 96% 98%  Weight:      Height:       Frail elderly caucasian lady not in distress . Afebrile. Head is nontraumatic. Neck is supple without bruit.    Cardiac exam no murmur or gallop. Lungs are clear to auscultation. Distal pulses are well felt. Neurological Exam :  Awake alert interactive. Mild expressive and receptive aphasia. Speaks only a few words. Follows simple midline and one-step commands only. Left gaze deviation  improved. Mild exotropia right eye ( chronic per family). Unable to look to the right and cross midline. Blinks to threat on the left but not on the right. Mild right lower facial weakness. Tongue midline. Follows  midline and 1 step commands on the left. Right upper extremity is   weak but now able to move it against gravity. Able to move right lower extremity  but has mild weakness. Purposeful antigravity movement on the left side. Deep tendon reflexes are depressed in the right and normal on the left. Right plantar upgoing left downgoing.  ASSESSMENT/PLAN Ms. MCKENZY SALAZAR is a 83 y.o. female with history of syncope, seizure disorder, PFO from echo 04/08/2017, peripheral vascular disease, atrial fibrillation on Eliquis, recent GI bleed, hypertension, recent thyroid biopsy, hyperlipidemia, emphysema, chronic kidney disease, and anxiety presenting with right-sided weakness, aphasia, and respiratory distress. She did not receive IV t-PA due to anticoagulation.  Cerbral  Angiogram / Intervention - Dr. Estanislado Pandy 09/11/2017 S/P mLt common carotid arteriogram,followed by complete revascularization of occluded lt MCA distal inferior division   With x 2  passes with solitaire 2mm x 40 mm FR retrieval device, and 4.5 mg of IA INTEGRELIN IA achieving a  TICI 3 reperfusion.  Stroke:  Left middle cerebral artery - embolic secondary to atrial fibrillation. Eliquis recently held for GI bleed.  Resultant  Mild aphasia, right visual field loss and right hemiplegia  CT head - Small focus of contrast staining vs petechial hemorrhage at the medial aspect of the lt frontal operculum.   MRI head - - not performed  MRA head - not performed  Carotid Doppler - cerebral angiogram  2D Echo - 04/08/2017 - EF 55-60% - PFO noted  LDL - 85  HgbA1c - 5.5  VTE prophylaxis - SCDs Diet NPO time specified  Eliquis (apixaban) daily prior to admission, now on No antithrombotic  Patient will be counseled to be compliant with her antithrombotic medications  Ongoing aggressive stroke risk factor management  Therapy recommendations:  pending  Disposition:  Pending  Hypertension  Stable  Permissive hypertension (OK if < 220/120) but gradually normalize in 5-7 days  Long-term BP goal normotensive  Hyperlipidemia  Home meds:  Lipitor 10 mg daily not resumed in hospital  LDL 85, goal < 70  Increase Lipitor to 20 mg daily  Continue statin at discharge   Other Stroke Risk Factors  Advanced age  Former cigarette smoker - quit 23 years ago  PFO  Afib on Eliquis (held for GI bleed)  Other Active Problems  Multi lobar pneumonia  Anemia - 10 / 29.5  CKD  Contrast allergy  Seizure disorder - Keppra    Plan / Recommendations     Continue IV heparin drip till either patient able to swallow safely, has a PEG tube then change  to eliquis. Family understands increased risk for bleeding and is agreeable with the plan to start anticoagulation Hospital day # 10  I have personally examined this patient, reviewed notes, independently viewed imaging studies, participated in medical decision making and plan of care.ROS completed by me  personally and pertinent positives fully documented  I have made any additions or clarifications directly to the above .   Marland Kitchen Discussed with Dr. Sabra Heck.and  Data processing manager. Transfer to inpatient rehabilitation when bed available. Follow-up as an outpatient in stroke clinic in 6 weeks This patient is critically ill and at significant risk of neurological worsening, death and care requires constant monitoring of vital signs, hemodynamics,respiratory and cardiac  monitoring, extensive review of multiple databases, frequent neurological assessment, discussion with family, other specialists and medical decision making of high complexity.I have made any additions or clarifications directly to the above note.This critical care time does not reflect procedure time, or teaching time or supervisory time of PA/NP/Med Resident etc but could involve care discussion time.  I spent 30 minutes of neurocritical care time  in the care of  this patient.     Antony Contras, MD Medical Director [facility] Pager: [phone] 09/16/2017 2:46 PM   To contact Stroke Continuity provider, please refer to [email or web address] After hours, contact General Neurology
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
## 2017-09-16 NOTE — H&P (Signed)
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 integrate therapy concepts, techniques,education, etc. 8. PT will assess and treat for/with: Lower extremity strength, range of motion, stamina, balance, functional mobility, safety, adaptive techniques and equipment, NMR, cognitive-perceptual rx, w/c assesment.   Goals are: Supervision to minimal assistance. 9. OT will assess and treat for/with: ADL's, functional mobility, safety, upper extremity strength, adaptive techniques and equipment, neuromuscular reeducation, family education, ego support.   Goals are: Supervision to minimal assist to moderate assist. Therapy may proceed with showering this patient. 10. SLP will assess and treat for/with: Cognition, swallowing, speech, language, family education.  Goals are: Min to mod assist. 11. Case Management and Social Worker will assess and treat for psychological issues and discharge planning. 12. Team conference will be held weekly to assess progress toward goals and to  determine barriers to discharge. 13. Patient will receive at least 3 hours of therapy per day at least 5 days per week. 14. ELOS: 20-25 days       15. Prognosis:  excellent     Meredith Staggers, MD, [facility] 09/16/2017  Bary Leriche, Hershal Coria 09/16/2017

## 2017-09-16 NOTE — Progress Notes (Signed)
Patient ID: Kayla Kent, female   DOB: 1932-05-21, 83 y.o.   MRN: [id number] Patient arrived from [facility] with RN and some belongings. Patient arrived with cortrak tube in place with tube feed infusing as well as heparin drip. Suction set up in the room. Skin assessment complete. Patient on contact for ESBL. Patient resting comfortably in bed with call bell at side.
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
## 2017-09-16 NOTE — Care Management Note (Signed)
Case Management Note  Patient Details  Name: Kayla Kent MRN: [id number] Date of Birth: June 29, 1932  Subjective/Objective:   Pt admitted on 09/06/17 initially with upper GI bleeding and PNA.  Pt developed new aphasia and Rt hemiplegia on 11/26 requiring medical  thrombectomy.                   Action/Plan: Pt now extubated; recommend PT/OT consults when able to tolerate therapies.    Expected Discharge Date:  09/16/17               Expected Discharge Plan:  IP Rehab Facility  In-House Referral:  Clinical Social Work  Discharge planning Services  CM Consult  Post Acute Care Choice:    Choice offered to:     DME Arranged:    DME Agency:     HH Arranged:    [place] Agency:     Status of Service:  Completed, signed off  If discussed at [employer], dates discussed:    Additional Comments:  09/16/17 J. Ruhan Borak, RN, BSN Pt medically stable for discharge today. She will discharge to [facility], as recommended by PT/OT.    Reinaldo Raddle, RN, BSN  Trauma/Neuro ICU Case Manager [phone]
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
## 2017-09-16 NOTE — Progress Notes (Signed)
Rehab admissions - I met with patient and her daughter at the bedside.  They would like inpatient rehab admission.  I gave daughter rehab brochures and explained inpatient rehab process.  Patient has been cleared for inpatient rehab admission for today.  Bed available and will admit to acute inpatient rehab today.  Call me for questions.  #[phone]

## 2017-09-16 NOTE — Consult Note (Signed)
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 Pharyngeal -- Pharyngeal- Pudding Cup -- Pharyngeal -- Pharyngeal- Honey Teaspoon Moderate aspiration;Delayed swallow initiation-pyriform sinuses;Reduced pharyngeal peristalsis;Reduced airway/laryngeal closure;Reduced tongue base retraction;Penetration/Aspiration during swallow;Pharyngeal residue - valleculae;Pharyngeal residue - pyriform;Pharyngeal residue - posterior pharnyx Pharyngeal Material enters airway, passes BELOW cords and not ejected out despite cough attempt by patient Pharyngeal- Honey Cup -- Pharyngeal -- Pharyngeal- Nectar Teaspoon -- Pharyngeal -- Pharyngeal- Nectar Cup -- Pharyngeal -- Pharyngeal- Nectar Straw -- Pharyngeal -- Pharyngeal- Thin Teaspoon -- Pharyngeal -- Pharyngeal- Thin Cup Moderate aspiration;Delayed swallow initiation-pyriform sinuses;Reduced pharyngeal peristalsis;Reduced airway/laryngeal closure;Reduced tongue base retraction;Penetration/Aspiration during swallow;Pharyngeal residue - valleculae;Pharyngeal residue -  pyriform;Pharyngeal residue - posterior pharnyx Pharyngeal Material enters airway, passes BELOW cords and not ejected out  despite cough attempt by patient Pharyngeal- Thin Straw -- Pharyngeal -- Pharyngeal- Puree Delayed swallow initiation-pyriform sinuses;Reduced pharyngeal peristalsis;Reduced airway/laryngeal closure;Reduced tongue base retraction;Pharyngeal residue - valleculae;Pharyngeal residue - pyriform;Pharyngeal residue - posterior pharnyx Pharyngeal Material does not enter airway Pharyngeal- Mechanical Soft -- Pharyngeal -- Pharyngeal- Regular -- Pharyngeal -- Pharyngeal- Multi-consistency -- Pharyngeal -- Pharyngeal- Pill -- Pharyngeal -- Pharyngeal Comment --  CHL IP CERVICAL ESOPHAGEAL PHASE 09/14/2017 Cervical Esophageal Phase WFL Pudding Teaspoon -- Pudding Cup -- Honey Teaspoon -- Honey Cup -- Nectar Teaspoon -- Nectar Cup -- Nectar Straw -- Thin Teaspoon -- Thin Cup -- Thin Straw -- Puree -- Mechanical Soft -- Regular -- Multi-consistency -- Pill -- Cervical Esophageal Comment -- No flowsheet data found. 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 Does the need for close, 24 hr/day medical supervision in concert with the patient's rehab needs make it unreasonable for this patient to be served in a less intensive setting? Yes 2. Co-Morbidities requiring supervision/potential complications: Chronic kidney disease, hypertension, seizure disorder, paroxysmal atrial fibrillation, and dysphagia 3. Due to bladder management, bowel management, safety, skin/wound care, disease management, medication administration and patient education, does the patient require 24 hr/day rehab nursing? Yes 4. Does the patient require coordinated care of a physician, rehab nurse, PT (1-2 hrs/day, 5 days/week), OT (1-2 hrs/day, 5 days/week) and SLP (1-2 hrs/day, 5 days/week) to address physical and functional deficits in the context of the above medical diagnosis(es)?  Yes Addressing deficits in the following areas: balance, endurance, locomotion, strength, transferring, bowel/bladder control, bathing, dressing, feeding, grooming, toileting, cognition, language, swallowing and psychosocial support 5. Can the patient actively participate in an intensive therapy program of at least 3 hrs of therapy per day at least 5 days per week? Yes 6. The potential for patient to make measurable gains while on inpatient rehab is excellent 7. Anticipated functional outcomes upon discharge from inpatient rehab are supervision and min assist  with PT, supervision, min assist and mod assist with OT, supervision, min assist and mod assist with SLP. 8. Estimated rehab length of stay to reach the above functional goals is: 20-25 days 9. Anticipated D/C setting: Home 10. Anticipated post D/C treatments: [place] therapy 11. Overall Rehab/Functional Prognosis: excellent  RECOMMENDATIONS: This patient's condition is appropriate for continued rehabilitative care in the following setting: CIR Patient has agreed to participate in recommended program. Yes Note that insurance prior authorization may be required for reimbursement for recommended care.  Comment: Rehab Admissions Coordinator to follow up.  Thanks,  Meredith Staggers, MD, [place] Pillar, PA-C 09/16/2017

## 2017-09-16 NOTE — Progress Notes (Signed)
[place] for heparin Indication: atrial fibrillation and stroke  Allergies  Allergen Reactions  . Chocolate Hives  . Codeine Other (See Comments)    Patient states she acts crazy  . Diazepam Other (See Comments)    Patient states she acts crazy.  . Fruit & Vegetable Daily [Nutritional Supplements] Hives and Swelling    peaches  . Iohexol Hives     Code: HIVES, Desc: pt gets 13 hr pre-meds, Onset Date: [id number]   . Latex Hives  . Peach [Prunus Persica] Hives  . Peanut-Containing Drug Products Hives and Swelling  . Penicillins Hives, Itching and Swelling    Has patient had a PCN reaction causing immediate rash, facial/tongue/throat swelling, SOB or lightheadedness with hypotension: Yes Has patient had a PCN reaction causing severe rash involving mucus membranes or skin necrosis: No Has patient had a PCN reaction that required hospitalization No Has patient had a PCN reaction occurring within the last 10 years: No If all of the above answers are "NO", then may proceed with Cephalosporin use.   . Strawberry Extract Hives  . Sulfonamide Derivatives Hives    nausea  . Wheat Shortness Of Breath    Shortness of breath  . Wheat Bran Shortness Of Breath  . Sulfamethoxazole Hives  . Crestor [Rosuvastatin Calcium] Rash  . Iodine Rash    Patient Measurements: Height: 5\' 7"  (170.2 cm) Weight: 108 lb 0.4 oz (49 kg) IBW/kg (Calculated) : 61.6  Vital Signs: Temp: 98.4 F (36.9 C) (11/30 0400) Temp Source: Oral (11/30 0400) BP: 137/54 (11/30 0700) Pulse Rate: 94 (11/30 0700)  Labs: Recent Labs    09/14/17 0429  09/15/17 0608 09/15/17 1411 09/16/17 0430  HGB 9.8*  --  10.3*  --  9.6*  HCT 29.9*  --  31.8*  --  30.0*  PLT 329  --  391  --  417*  HEPARINUNFRC  --    < > 0.17* 0.26* 0.51  CREATININE 0.74  --   --   --  0.57   < > = values in this interval not displayed.    Estimated Creatinine Clearance: 41.2 mL/min (by C-G formula  based on SCr of 0.57 mg/dL).   Medical History: Past Medical History:  Diagnosis Date  . Allergic rhinitis   . Anxiety   . CKD (chronic kidney disease) stage 3, GFR 30-59 ml/min (HCC)   . Emphysema of lung ([place])   . HLD (hyperlipidemia)   . HTN (hypertension)   . Intolerance of drug    orthostatic  . Paroxysmal supraventricular tachycardia ([place])   . PVD (peripheral vascular disease) ([place])   . Seizure ([place])   . Syncope and collapse   . Uterine prolapse without mention of vaginal wall prolapse     Medications:  Infusions:  . sodium chloride    . feeding supplement (OSMOLITE 1.2 CAL) 1,000 mL (09/16/17 0700)  . heparin 950 Units/hr (09/16/17 0700)  . niCARDipine Stopped (09/12/17 2322)    Assessment: 65 YOF presented with GI bleed and aspiration pna, eliquis held d/t bleed, then found with R-sided weakness and aphasia >> L-MCA infarct 2/2 afib and no tPA given d/t GI bleed.  Failed swallow 11/28 and will need AC per neuro until eliquis can be administered.    Heparin level remains now very slightly supratherapeutic. CBC stable. No bleed or IV line issues per RN.  Goal of Therapy:  Heparin level 0.3-0.5 units/ml, no bolus Monitor platelets by anticoagulation protocol: Yes  Plan:  -Decrease heparin slightly to 900 units/hr -Daily heparin level, CBC -Monitor for s/sx bleeding -F/u Neuro plans   Elicia Lamp, PharmD, BCPS Clinical Pharmacist Rx Phone # for today: [phone] After 3:30PM, please call Main Rx: [phone] 09/16/2017 7:27 AM

## 2017-09-16 NOTE — Discharge Summary (Signed)
Physician Discharge Summary       Patient ID: Kayla Kent MRN: [id number] DOB/AGE: 12/08/1931 83 y.o.  Admit date: 09/06/2017 Discharge date: 09/16/2017  Discharge Diagnoses:   Acute MCA stroke Acute respiratory failure with hypoxemia in the setting of aspiration pneumonia Probable chronic obstructive pulmonary disease  pleural effusions Aspiration pneumonia Atrial fibrillation Presumed diastolic heart failure Fluid and electrolyte imbalance Gastritis Dysphasia Right-sided hemiparesis Physical deconditioning  Detailed Hospital Course:  83 year old female with a past medical history of: Supraventricular tachycardia, CKD stage III, seizure disorder, right upper lobe lung cancer status post radiation therapy typically on apixaban.  Presented 11/20 with confusion and hematocrit emesis.  An EGD was performed on 1123 demonstrating patchy mild inflammation throughout the stomach and she was treated with the presumptive diagnosis of gastritis.  She was additionally treated for an ESBL producing urinary tract infection with cultures growing E. Coli in the past but subsequently growing Klebsiella on follow-up culture.  On 1125 during shift change at 7 PM she was noted to be aphasic and acutely hemiplegic on the right side she also had respiratory distress.   A code stroke was obtained.  CT was negative for hemorrhage but worrisome for hyperdense MCA sign.  Neurology had been consulted as part of the stroke team.  She ultimately underwent mechanical thrombectomy By interventional radiology.  Course additionally complicated by what was presumed to be an aspiration pneumonia versus healthcare associated pneumonia.  As she developed increased respiratory distress during the time of her stroke she was intubated for airway protection as well as to help proceed in mechanical thrombectomy.  Because of this the critical care team was asked to assume her care.She was successfully extubated on 11/27.  She  is made slow but progressive improvements over the course of her hospitalization since her thrombectomy and subsequent extubation.  From a neurological standpoint she is awake, following commands, and participating in physical therapy.  She is been seen by rehabilitation services and felt to be a good candidate for inpatient rehab.  She is clear for transfer to inpatient rehab at this time where she can continue to focus on her rehabilitation following her acute stroke.  She will be discharged to inpatient rehab with the following active problems see below.   Discharge Plan by active problems   Acute left MCA stroke Secondary to atrial fibrillation. status post mechanical thrombectomy.  At time of transfer she continues to have dense right hemiplegia and dysphasia. Most recent CT scan of the head showed a small focus of contrast staining versus petechial hemorrhage at the medial aspect of the left frontal lobe. Echocardiogram EF 55-60% with PFO noted. Plan Continuing heparin infusion Continuing aspirin Will need to decide eventually on timing of resuming apixaban Transfer to inpatient rehab  Aspiration pneumonia Plan Completed antibiotics, trend fever and white blood cell curve.   Dysphagia Status post acute CVA Plan Continue tube feeds SLP to follow and evaluate swallowing function  Atrial fibrillation and probable diastolic heart dysfunction. Plan Continue aspirin Continuing heparin drip Holding Cardizem at this point although this will need to be reevaluated as will plan for long-term anticoagulation    Significant Hospital tests/ studies  Consults cardiology, Gastroenterology, neurology.  CULTURES: Respiratory viral panel negative Urine culture: > Negative 09/11/17 blood culture pending>> no growth to date on 09/16/2017>> MRSA screen negative 1125 sputum culture>> negative   ANTIBIOTICS: cipro 11/20-11/21 Meropenem 11/21>>> off Vancomycin 11/22-11/24 linezolid  11/25>> off   Discharge Exam: BP (!) 151/66   Pulse  85   Temp 98.3 F (36.8 C) (Axillary)   Resp 17   Ht 5' 7"  (1.702 m)   Wt 108 lb 0.4 oz (49 kg)   SpO2 98%   BMI 16.92 kg/m    General: Frail elderly female, awake and follows commands HEENT: Right facial droop noted, feeding tube in place PSY: Difficult to assess Neuro: Dense right hemiplegia right facial droop follows commands CV: Heart sounds are regular atrial fibrillation, ventricular rate of 94 PULM: Coarse rhonchi bilaterally, breath sounds at the base KD:TOIZ, non-tender, bsx4 active.   Extremities: warm/dry, 1+ edema  Skin: no rashes or lesions     Labs at discharge Lab Results  Component Value Date   CREATININE 0.57 09/16/2017   BUN 27 (H) 09/16/2017   NA 142 09/16/2017   K 4.3 09/16/2017   CL 110 09/16/2017   CO2 28 09/16/2017   Lab Results  Component Value Date   WBC 10.4 09/16/2017   HGB 9.6 (L) 09/16/2017   HCT 30.0 (L) 09/16/2017   MCV 94.0 09/16/2017   PLT 417 (H) 09/16/2017   Lab Results  Component Value Date   ALT 58 (H) 09/08/2017   AST 109 (H) 09/08/2017   ALKPHOS 109 09/08/2017   BILITOT 1.2 09/08/2017   Lab Results  Component Value Date   INR 1.17 09/13/2017   INR 1.22 09/11/2017   INR 1.59 04/06/2017    Current radiology studies Dg Chest Portable 1 View  Result Date: 09/16/2017 CLINICAL DATA:  Aspiration pneumonia EXAM: PORTABLE CHEST 1 VIEW COMPARISON:  09/15/2017 FINDINGS: Patchy bilateral airspace opacities are again noted superimposed on COPD. Cardiomegaly. No change since prior study. Small effusions, left greater than right. IMPRESSION: Attending patchy bilateral airspace disease and small effusions. COPD.  Cardiomegaly. No change. Electronically Signed   By: Rolm Baptise M.D.   On: 09/16/2017 09:42   Dg Chest Port 1 View  Result Date: 09/15/2017 CLINICAL DATA:  83 year old female status post left MCA emergent large vessel occlusion and endovascular revascularization.  EXAM: PORTABLE CHEST 1 VIEW COMPARISON:  09/13/2017 and earlier. FINDINGS: Portable AP upright view at 1306 hours. Extubated. NG type tube has been removed and an enteric feeding tube courses to the abdomen, tip not occluded. Mildly lower lung volumes. Mildly increased bilateral perihilar opacity, most apparent in the right upper lung. Continued dense opacification at the left lung base. Stable cardiac size and mediastinal contours. Calcified aortic atherosclerosis. Stable visualized osseous structures. IMPRESSION: 1. Extubated.  NG tube removed and feeding tube placed. 2. Lower lung volumes with increasing bilateral perihilar opacity. Consider mild or developing pulmonary edema. 3. Continued dense left lung base opacity compatible with a combination of pleural effusion and lung base collapse/consolidation. Electronically Signed   By: Genevie Ann M.D.   On: 09/15/2017 13:42    Disposition:  to CIR   Discharge Instructions    Increase activity slowly   Complete by:  As directed      Allergies as of 09/16/2017      Reactions   Chocolate Hives   Codeine Other (See Comments)   Patient states she acts crazy   Diazepam Other (See Comments)   Patient states she acts crazy.   Fruit & Vegetable Daily [nutritional Supplements] Hives, Swelling   peaches   Iohexol Hives    Code: HIVES, Desc: pt gets 13 hr pre-meds, Onset Date: [id number]   Latex Hives   Peach [prunus Persica] Hives   Peanut-containing Drug Products Hives, Swelling   Penicillins Hives,  Itching, Swelling   Has patient had a PCN reaction causing immediate rash, facial/tongue/throat swelling, SOB or lightheadedness with hypotension: Yes Has patient had a PCN reaction causing severe rash involving mucus membranes or skin necrosis: No Has patient had a PCN reaction that required hospitalization No Has patient had a PCN reaction occurring within the last 10 years: No If all of the above answers are "NO", then may proceed with Cephalosporin use.    Strawberry Extract Hives   Sulfonamide Derivatives Hives   nausea   Wheat Shortness Of Breath   Shortness of breath   Wheat Bran Shortness Of Breath   Sulfamethoxazole Hives   Crestor [rosuvastatin Calcium] Rash   Iodine Rash      Medication List    STOP taking these medications   ALPRAZolam 0.25 MG tablet Commonly known as:  XANAX   amLODipine 2.5 MG tablet Commonly known as:  NORVASC   bismuth subsalicylate 109 NA/35TD suspension Commonly known as:  PEPTO BISMOL   ciprofloxacin 250 MG tablet Commonly known as:  CIPRO   DILT-XR 180 MG 24 hr capsule Generic drug:  diltiazem   ELIQUIS 2.5 MG Tabs tablet Generic drug:  apixaban   ibuprofen 600 MG tablet Commonly known as:  ADVIL,MOTRIN   levETIRAcetam 250 MG tablet Commonly known as:  KEPPRA Replaced by:  levETIRAcetam 100 MG/ML solution   methocarbamol 500 MG tablet Commonly known as:  ROBAXIN   oxybutynin 10 MG 24 hr tablet Commonly known as:  DITROPAN XL   pantoprazole 40 MG tablet Commonly known as:  PROTONIX Replaced by:  pantoprazole sodium 40 mg/20 mL Pack   traMADol 50 MG tablet Commonly known as:  ULTRAM   traZODone 50 MG tablet Commonly known as:  DESYREL     TAKE these medications   acetaminophen 325 MG tablet Commonly known as:  TYLENOL Take 2 tablets (650 mg total) by mouth every 4 (four) hours as needed for mild pain (or temp > 37.5 C (99.5 F)).   aspirin 300 MG suppository Place 1 suppository (300 mg total) rectally daily. Start taking on:  09/17/2017   atorvastatin 20 MG tablet Commonly known as:  LIPITOR Take 1 tablet (20 mg total) by mouth daily at 6 PM. What changed:    medication strength  how much to take  how to take this  when to take this  additional instructions   bisacodyl 10 MG suppository Commonly known as:  DULCOLAX Place 1 suppository (10 mg total) rectally daily as needed for moderate constipation.   chlorhexidine gluconate (MEDLINE KIT) 0.12 %  solution Commonly known as:  PERIDEX 15 mLs by Mouth Rinse route 2 (two) times daily.   feeding supplement (OSMOLITE 1.2 CAL) Liqd Place 1,000 mLs into feeding tube continuous.   heparin 100-0.45 UNIT/ML-% infusion Inject 900 Units/hr into the vein continuous.   ipratropium-albuterol 0.5-2.5 (3) MG/3ML Soln Commonly known as:  DUONEB Take 3 mLs by nebulization every 4 (four) hours as needed.   levETIRAcetam 100 MG/ML solution Commonly known as:  KEPPRA Take 2.5 mLs (250 mg total) by mouth 2 (two) times daily. Replaces:  levETIRAcetam 250 MG tablet   pantoprazole sodium 40 mg/20 mL Pack Commonly known as:  PROTONIX Place 20 mLs (40 mg total) into feeding tube daily at 12 noon. Start taking on:  09/17/2017 Replaces:  pantoprazole 40 MG tablet   senna-docusate 8.6-50 MG tablet Commonly known as:  Senokot-S 1 tablet by Per NG tube route at bedtime.   sodium chloride 0.65 %  Soln nasal spray Commonly known as:  OCEAN Place 1 spray into both nostrils as needed for congestion.   sodium chloride 0.9 % infusion Inject 250 mLs into the vein as needed (if IV carrier fluid needed.).        Discharged Condition: fair  Physician Statement:   The Patient was personally examined, the discharge assessment and plan has been personally reviewed and I agree with ACNP Sherhonda Gaspar's assessment and plan. 45 minutes of time have been dedicated to discharge assessment, planning and discharge instructions.   Signed: Clementeen Graham 09/16/2017, 3:50 PM

## 2017-09-16 NOTE — Progress Notes (Signed)
PULMONARY / CRITICAL CARE MEDICINE   Name: Kayla Kent MRN: [id number] DOB: [birth year]    ADMISSION DATE:  09/06/2017 CONSULTATION DATE:  09/11/2017  REFERRING MD:  Lesia Sago    CHIEF COMPLAINT:  Respiratory failure  HISTORY OF PRESENT ILLNESS:   83 year old female history of on apixaban, seizure disorder, documented COPD, right upper lobe lung cancer status post radiation therapy who initially presented 09/06/17 for confusion and hematemesis after being found down.  EGD was performed 11/23 which was only notable for patchy mild inflammation thoughout the stomach, respumed gastritis with stable hemoglobin and no further episodes of bleeding.  In addition she was being treated for an ESBL UTI based on urinalysis showing many bacteria and prior cultures showing ESBL E. coli.  Urine cultures here subsequently grew greater than 100,000 Klebsiella pneumoniae.  She was also noted to have multifocal infiltrates on her CT chest concerning for pneumonia.  Infectious disease was consulted on 11/25 and recommended changing vancomycin to linezolid for presumed aspiration causing her pneumonia throughout her admission her oxygen requirement has progressively increased to requiring a nonrebreather today to maintain oxygen saturation at 90% for which we were consulted.  Intubated to undergo medical thrombectomy by interventional radiology for acute stroke.  Patient to be assessed today for possible extubation.  Care plan discussed with stroke neurology service.    SUBJECTIVE:  More awake, commands no acute distress.    VITAL SIGNS: BP (!) 145/49   Pulse 81   Temp 98.5 F (36.9 C) (Oral)   Resp 18   Ht 5\' 7"  (1.702 m)   Wt 108 lb 0.4 oz (49 kg)   SpO2 93%   BMI 16.92 kg/m   HEMODYNAMICS:    VENTILATOR SETTINGS:    INTAKE / OUTPUT: I/O last 3 completed shifts: In: 1568 [I.V.:295.3; NG/GT:1272.7] Out: 1100 [Urine:1100]  PHYSICAL EXAMINATION: General: Frail elderly female,  awake and follows commands HEENT: Right facial droop noted, feeding tube in place PSY: Difficult to assess Neuro: Dense right hemiplegia right facial droop follows commands CV: Heart sounds are regular atrial fibrillation, ventricular rate of 94 PULM: Coarse rhonchi bilaterally, breath sounds at the base KN:[id number], non-tender, bsx4 active.   Extremities: warm/dry, 1+ edema  Skin: no rashes or lesions   LABS:  BMET Recent Labs  Lab 09/13/17 0334 09/14/17 0429 09/16/17 0430  NA 140 142 142  K 3.7 3.4* 4.3  CL 108 105 110  CO2 25 25 28   BUN 29* 28* 27*  CREATININE 0.80 0.74 0.57  GLUCOSE 80 66 138*    Electrolytes Recent Labs  Lab 09/13/17 0334 09/14/17 0429  09/15/17 0608 09/15/17 1626 09/16/17 0430  CALCIUM 7.7* 7.9*  --   --   --  7.7*  MG  --   --    < > 2.2 2.2 2.4  PHOS  --   --    < > 2.6 2.2* 2.2*   < > = values in this interval not displayed.    CBC Recent Labs  Lab 09/14/17 0429 09/15/17 0608 09/16/17 0430  WBC 10.0 11.0* 10.4  HGB 9.8* 10.3* 9.6*  HCT 29.9* 31.8* 30.0*  PLT 329 391 417*    Coag's Recent Labs  Lab 09/11/17 2010 09/13/17 0334  APTT 36  --   INR 1.22 1.17    Sepsis Markers Recent Labs  Lab 09/11/17 0831 09/11/17 1119 09/12/17 0549 09/13/17 0334  LATICACIDVEN 1.4 2.4*  --   --   PROCALCITON 2.17  --  1.90 1.31  ABG Recent Labs  Lab 09/11/17 1255 09/11/17 2359 09/13/17 0405  PHART 7.529* 7.350 7.495*  PCO2ART 31.7* 44.0 33.9  PO2ART 58.7* 79.2* 117*    Liver Enzymes No results for input(s): AST, ALT, ALKPHOS, BILITOT, ALBUMIN in the last 168 hours.  Cardiac Enzymes No results for input(s): TROPONINI, PROBNP in the last 168 hours.  Glucose Recent Labs  Lab 09/15/17 1204 09/15/17 1753 09/15/17 1951 09/15/17 2356 09/16/17 0348 09/16/17 0745  GLUCAP 133* 143* 150* 141* 168* 163*    Imaging Dg Chest Portable 1 View  Result Date: 09/16/2017 CLINICAL DATA:  Aspiration pneumonia EXAM: PORTABLE CHEST  1 VIEW COMPARISON:  09/15/2017 FINDINGS: Patchy bilateral airspace opacities are again noted superimposed on COPD. Cardiomegaly. No change since prior study. Small effusions, left greater than right. IMPRESSION: Attending patchy bilateral airspace disease and small effusions. COPD.  Cardiomegaly. No change. Electronically Signed   By: Rolm Baptise M.D.   On: 09/16/2017 09:42   Dg Chest Port 1 View  Result Date: 09/15/2017 CLINICAL DATA:  83 year old female status post left MCA emergent large vessel occlusion and endovascular revascularization. EXAM: PORTABLE CHEST 1 VIEW COMPARISON:  09/13/2017 and earlier. FINDINGS: Portable AP upright view at 1306 hours. Extubated. NG type tube has been removed and an enteric feeding tube courses to the abdomen, tip not occluded. Mildly lower lung volumes. Mildly increased bilateral perihilar opacity, most apparent in the right upper lung. Continued dense opacification at the left lung base. Stable cardiac size and mediastinal contours. Calcified aortic atherosclerosis. Stable visualized osseous structures. IMPRESSION: 1. Extubated.  NG tube removed and feeding tube placed. 2. Lower lung volumes with increasing bilateral perihilar opacity. Consider mild or developing pulmonary edema. 3. Continued dense left lung base opacity compatible with a combination of pleural effusion and lung base collapse/consolidation. Electronically Signed   By: Genevie Ann M.D.   On: 09/15/2017 13:42   [name] IMPRESSION: 1.  Stable lines and tubes. 2. Continued left pleural effusion with associated left lung base collapse or consolidation. 3. Coarse bilateral pulmonary interstitial opacity but regression of indistinct perihilar opacity (perhaps perihilar pulmonary edema) since 09/11/2017.    CULTURES: Respiratory viral panel negative Urine culture: > Negative 09/11/17 blood culture pending>> no growth to date on 09/16/2017>> MRSA screen negative 1125 sputum culture>>  negative   ANTIBIOTICS: cipro 11/20-11/21 Meropenem 11/21>>> off Vancomycin 11/22-11/24 linezolid 11/25>> off   SIGNIFICANT EVENTS: Status post thrombectomy of the left MCA.  LINES/TUBES: Peripherals  DISCUSSION: 83 year old female developed a aphasia and right hemiplegia yesterday requiring medical thrombectomy.  Currently more awake follows commands with the left side. ASSESSMENT / PLAN:  PULMONARY A: 1.  Acute respiratory failure with hypoxemia; improved with diuresis.  Status post extubation. 2.  COPD, undocumented 3.  Pleural effusions 4.  Aspiration with pneumonia off antibiotics P:   Supplemental O2 as needed  PRN BD  Pulmonary hygiene as able  Pa and lateral chest xray while in rehab.   CARDIOVASCULAR A:  Atrial fibrillation. Probable diastolic congestive heart failure P: Cards following  Currently on heparin drip Continue to hold cardizem      RENAL Lab Results  Component Value Date   CREATININE 0.57 09/16/2017   CREATININE 0.74 09/14/2017   CREATININE 0.80 09/13/2017   CREATININE 1.3 (H) 08/23/2017   CREATININE 1.16 (H) 08/06/2016   CREATININE 0.98 (H) 09/22/2015   Recent Labs  Lab 09/13/17 0334 09/14/17 0429 09/16/17 0430  K 3.7 3.4* 4.3    A: Renal insufficiency.  Resolved  P:   Monitor lites and creatinine  GASTROINTESTINAL A: Gastritis, status post EGD  P: 1.  D/C protonix. No longer required. 2.  Being fed enterally via feeding tube.  HEMATOLOGIC Recent Labs    09/15/17 0608 09/16/17 0430  HGB 10.3* 9.6*     A: No active issues P:  SCD's On heparin drip Monitor hemoglobin with recent GI bleed  INFECTIOUS A:   Aspiration pneumonia  P:   Completed antibiotics.    ENDOCRINE A: No active issues  NEUROLOGIC A: Acute stroke - s/p thrombectomy  AMS -09/16/2017 awake and follows commands dense right hemiplegia, right facial droop P:   Currently on heparin drip per pharmacy eliquis when ok with neuro/GI        FAMILY  - Updates 09/16/2017 answered son's questions. - Inter-disciplinary family meet or Palliative Care meeting due by: day 7   OK for transfer to rehabilitation from an intensivist standpoint.  Asa Saunas, NP Salley Scarlet, MD

## 2017-09-16 NOTE — Progress Notes (Signed)
[place] for heparin Indication: atrial fibrillation and stroke  Allergies  Allergen Reactions  . Chocolate Hives  . Codeine Other (See Comments)    Patient states she acts crazy  . Diazepam Other (See Comments)    Patient states she acts crazy.  . Fruit & Vegetable Daily [Nutritional Supplements] Hives and Swelling    peaches  . Iohexol Hives     Code: HIVES, Desc: pt gets 13 hr pre-meds, Onset Date: [id number]   . Latex Hives  . Peach [Prunus Persica] Hives  . Peanut-Containing Drug Products Hives and Swelling  . Penicillins Hives, Itching and Swelling    Has patient had a PCN reaction causing immediate rash, facial/tongue/throat swelling, SOB or lightheadedness with hypotension: Yes Has patient had a PCN reaction causing severe rash involving mucus membranes or skin necrosis: No Has patient had a PCN reaction that required hospitalization No Has patient had a PCN reaction occurring within the last 10 years: No If all of the above answers are "NO", then may proceed with Cephalosporin use.   . Strawberry Extract Hives  . Sulfonamide Derivatives Hives    nausea  . Wheat Shortness Of Breath    Shortness of breath  . Wheat Bran Shortness Of Breath  . Sulfamethoxazole Hives  . Crestor [Rosuvastatin Calcium] Rash  . Iodine Rash    Patient Measurements:    Vital Signs: Temp: 97.8 F (36.6 C) (11/30 1600) Temp Source: Oral (11/30 1600) BP: 151/66 (11/30 1400) Pulse Rate: 85 (11/30 1400)  Labs: Recent Labs    09/14/17 0429  09/15/17 0608 09/15/17 1411 09/16/17 0430 09/16/17 1615  HGB 9.8*  --  10.3*  --  9.6*  --   HCT 29.9*  --  31.8*  --  30.0*  --   PLT 329  --  391  --  417*  --   HEPARINUNFRC  --    < > 0.17* 0.26* 0.51 0.55  CREATININE 0.74  --   --   --  0.57  --    < > = values in this interval not displayed.    Estimated Creatinine Clearance: 41.2 mL/min (by C-G formula based on SCr of 0.57 mg/dL).   Medical  History: Past Medical History:  Diagnosis Date  . Allergic rhinitis   . Anxiety   . CKD (chronic kidney disease) stage 3, GFR 30-59 ml/min (HCC)   . Emphysema of lung ([place])   . HLD (hyperlipidemia)   . HTN (hypertension)   . Intolerance of drug    orthostatic  . Paroxysmal supraventricular tachycardia ([place])   . PVD (peripheral vascular disease) ([place])   . Seizure ([place])   . Syncope and collapse   . Uterine prolapse without mention of vaginal wall prolapse     Medications:  Infusions:  . feeding supplement (OSMOLITE 1.2 CAL)    . heparin      Assessment: 89 YOF presented with GI bleed and aspiration pna, eliquis held d/t bleed, then found with R-sided weakness and aphasia >> L-MCA infarct 2/2 afib and no tPA given d/t GI bleed.  Failed swallow 11/28 and will need AC per neuro until eliquis can be administered. Patient has transferred to 4W.   Heparin level remains now very slightly supratherapeutic at 0.55 on 900 units/hr. CBC stable. No bleed or IV line issues per RN.   Goal of Therapy:  Heparin level 0.3-0.5 units/ml, no bolus Monitor platelets by anticoagulation protocol: Yes    Plan:  -Decrease heparin  slightly to 850 units/hr -Obtain 8 hours level -Daily heparin level, CBC -Monitor for s/sx bleeding -F/u Neuro plans   Doylene Canard, PharmD Clinical Pharmacist  Pager: [phone] Clinical Phone for 09/16/2017: [phone] 09/16/2017 5:20 PM

## 2017-09-17 ENCOUNTER — Inpatient Hospital Stay (HOSPITAL_COMMUNITY): Payer: Medicare Other

## 2017-09-17 ENCOUNTER — Inpatient Hospital Stay (HOSPITAL_COMMUNITY): Payer: Medicare Other | Admitting: Speech Pathology

## 2017-09-17 ENCOUNTER — Inpatient Hospital Stay (HOSPITAL_COMMUNITY): Payer: Medicare Other | Admitting: Occupational Therapy

## 2017-09-17 ENCOUNTER — Inpatient Hospital Stay (HOSPITAL_COMMUNITY): Payer: Medicare Other | Admitting: Physical Therapy

## 2017-09-17 DIAGNOSIS — R400 Somnolence: Secondary | ICD-10-CM

## 2017-09-17 DIAGNOSIS — D500 Iron deficiency anemia secondary to blood loss (chronic): Secondary | ICD-10-CM

## 2017-09-17 LAB — HEPARIN LEVEL (UNFRACTIONATED)
HEPARIN UNFRACTIONATED: 0.36 [IU]/mL (ref 0.30–0.70)
Heparin Unfractionated: 0.31 IU/mL (ref 0.30–0.70)

## 2017-09-17 LAB — COMPREHENSIVE METABOLIC PANEL
ALBUMIN: 2 g/dL — AB (ref 3.5–5.0)
ALT: 70 U/L — AB (ref 14–54)
AST: 59 U/L — AB (ref 15–41)
Alkaline Phosphatase: 92 U/L (ref 38–126)
Anion gap: 6 (ref 5–15)
BUN: 25 mg/dL — AB (ref 6–20)
CHLORIDE: 109 mmol/L (ref 101–111)
CO2: 25 mmol/L (ref 22–32)
CREATININE: 0.6 mg/dL (ref 0.44–1.00)
Calcium: 7.6 mg/dL — ABNORMAL LOW (ref 8.9–10.3)
GFR calc Af Amer: >60 mL/min (ref >60)
GLUCOSE: 118 mg/dL — AB (ref 65–99)
POTASSIUM: 4.2 mmol/L (ref 3.5–5.1)
Sodium: 140 mmol/L (ref 135–145)
Total Bilirubin: 0.4 mg/dL (ref 0.3–1.2)
Total Protein: 4.9 g/dL — ABNORMAL LOW (ref 6.5–8.1)

## 2017-09-17 LAB — GLUCOSE, CAPILLARY
GLUCOSE-CAPILLARY: 119 mg/dL — AB (ref 65–99)
GLUCOSE-CAPILLARY: 138 mg/dL — AB (ref 65–99)
GLUCOSE-CAPILLARY: 99 mg/dL (ref 65–99)
GLUCOSE-CAPILLARY: 130 mg/dL — AB (ref 65–99)
Glucose-Capillary: 128 mg/dL — ABNORMAL HIGH (ref 65–99)
Glucose-Capillary: 133 mg/dL — ABNORMAL HIGH (ref 65–99)

## 2017-09-17 LAB — CBC WITH DIFFERENTIAL/PLATELET
BASOS ABS: 0 10*3/uL (ref 0.0–0.1)
BASOS PCT: 0 %
EOS ABS: 0.1 10*3/uL (ref 0.0–0.7)
EOS PCT: 1 %
HEMATOCRIT: 30.3 % — AB (ref 36.0–46.0)
Hemoglobin: 9.7 g/dL — ABNORMAL LOW (ref 12.0–15.0)
LYMPHS ABS: 1.5 10*3/uL (ref 0.7–4.0)
Lymphocytes Relative: 10 %
MCH: 30.2 pg (ref 26.0–34.0)
MCHC: 32 g/dL (ref 30.0–36.0)
MCV: 94.4 fL (ref 78.0–100.0)
Monocytes Absolute: 1.2 10*3/uL — ABNORMAL HIGH (ref 0.1–1.0)
Monocytes Relative: 8 %
NEUTROS PCT: 81 %
Neutro Abs: 12.9 10*3/uL — ABNORMAL HIGH (ref 1.7–7.7)
PLATELETS: 420 10*3/uL — AB (ref 150–400)
RBC: 3.21 MIL/uL — AB (ref 3.87–5.11)
RDW: 15.6 % — AB (ref 11.5–15.5)
WBC: 15.7 10*3/uL — AB (ref 4.0–10.5)

## 2017-09-17 LAB — MAGNESIUM: Magnesium: 2.1 mg/dL (ref 1.7–2.4)

## 2017-09-17 LAB — PHOSPHORUS: PHOSPHORUS: 2.2 mg/dL — AB (ref 2.5–4.6)

## 2017-09-17 NOTE — Progress Notes (Signed)
ANTICOAGULATION CONSULT NOTE - Follow Up Consult  Pharmacy Consult for heparin Indication: atrial fibrillation and stroke  Labs: Recent Labs    09/14/17 0429  09/15/17 0608  09/16/17 0430 09/16/17 1615 09/17/17 0207  HGB 9.8*  --  10.3*  --  9.6*  --  9.7*  HCT 29.9*  --  31.8*  --  30.0*  --  30.3*  PLT 329  --  391  --  417*  --  420*  HEPARINUNFRC  --    < > 0.17*   < > 0.51 0.55 0.31  CREATININE 0.74  --   --   --  0.57  --  0.60   < > = values in this interval not displayed.    Assessment/Plan:  83yo female therapeutic on heparin after rate changes. Will continue gtt at current rate and confirm stable with additional level.   Wynona Neat, PharmD, BCPS  09/17/2017,3:06 AM

## 2017-09-17 NOTE — Progress Notes (Signed)
Initial Nutrition Assessment  DOCUMENTATION CODES:  Non-severe (moderate) malnutrition in context of chronic illness, Underweight  INTERVENTION:  Anticipating patient will be unhooked from TF ~4hrs/day for therapy, recommend increasing Osomolite 1.2 TF via NGT to 70 cc/hr. Running for 20 hrs would provide: 1680 kcals, 78g Pro, 1148 ml fluid (+300 from existing flushes)  Would also recommend increasing flush amount to 200 cc q8 to increase free water from 1448 to 1748 mls/day.   Noted pt on laxative. Having type 7 stools, may consider discontinuing if diarrhea.   NUTRITION DIAGNOSIS:  Moderate Malnutrition related to chronic illness as evidenced by moderate muscle/fat loss  GOAL:  Patient will meet greater than or equal to 90% of their needs  MONITOR:  TF tolerance, I & O's, Diet advancement, Labs, Weight trends  REASON FOR ASSESSMENT:  Patient admitted on tube feeding  ASSESSMENT:  83 y/o female PMH of sz d/o, Anxiety, CKD3, HLD/HTN, CHF, Afib, PVD, COPD, RUL lung ca s/p XRT admitted 11/20 with upper GI bleed (s/p EGD) and PNA. Pt developed new aphasia and R hemiplegia on 11/26, s/p thrombectomy of occluded L MCA.  Now transferred to inpatient rehab  Pt arriving to inpatient rehab with cortrak tube and current TF regimen of Osmolite 1.2 @ 60.  Providing, 1728 kcals, 80 g Pro, 1181 ml fluid +100 ml q 8 hrs.   Patient aphasic and did not open eyes when RD present, however, she gives hint she understands questions by slight head shakes "no" when asked about diarrhea or nausea. Her abdomen is mildly distended.   RD asked RN about diarrhea. She said there have no reports of diarrhea this time. If she does have, would recommend d/cing laxative prior to decreasing TF.   Driest weight in the hospital ~106 lbs. Currently 108 lbs.   Meds: PPI, Senokot S Labs: Phos: low, but stable. Glu:120-140, Albumin:2.0, WBC: 15.7,   Recent Labs  Lab 09/14/17 0429  09/15/17 1626 09/16/17 0430  09/17/17 0207  NA 142  --   --  142 140  K 3.4*  --   --  4.3 4.2  CL 105  --   --  110 109  CO2 25  --   --  28 25  BUN 28*  --   --  27* 25*  CREATININE 0.74  --   --  0.57 0.60  CALCIUM 7.9*  --   --  7.7* 7.6*  MG  --    < > 2.2 2.4 2.1  PHOS  --    < > 2.2* 2.2* 2.2*  GLUCOSE 66  --   --  138* 118*   < > = values in this interval not displayed.   NUTRITION - FOCUSED PHYSICAL EXAM:   Most Recent Value  Orbital Region  Severe depletion  Upper Arm Region  Moderate depletion  Thoracic and Lumbar Region  Moderate depletion  Buccal Region  No depletion  Temple Region  Severe depletion  Clavicle Bone Region  Moderate depletion  Clavicle and Acromion Bone Region  Moderate depletion  Scapular Bone Region  Unable to assess  Dorsal Hand  Mild depletion  Patellar Region  Unable to assess  Anterior Thigh Region  Severe depletion  Posterior Calf Region  Moderate depletion     Diet Order:  Diet NPO time specified  EDUCATION NEEDS:  No education needs have been identified at this time  Skin: PU stage 1 to sacrum, PU stage 1 to R buttock  Last BM:  12/1-incontinent,  type 7 stools  Height:  Ht Readings from Last 1 Encounters:  09/16/17 5\' 7"  (1.702 m)   Weight:  Wt Readings from Last 1 Encounters:  09/17/17 107 lb 12.9 oz (48.9 kg)   Wt Readings from Last 10 Encounters:  09/17/17 107 lb 12.9 oz (48.9 kg)  09/16/17 108 lb 0.4 oz (49 kg)  07/27/17 105 lb (47.6 kg)  07/21/17 108 lb 12.8 oz (49.4 kg)  07/04/17 108 lb (49 kg)  06/21/17 107 lb 5 oz (48.7 kg)  06/15/17 110 lb (49.9 kg)  05/31/17 113 lb (51.3 kg)  05/09/17 112 lb (50.8 kg)  04/26/17 113 lb 6.4 oz (51.4 kg)   Ideal Body Weight:  61.36 kg  BMI:  Body mass index is 16.88 kg/m.  Estimated Nutritional Needs:  Kcal:  2023-3435(68-61 kcal/kg bw) Protein:  68-78g pro (1.4-1.6 g/kg bw) Fluid:  1.6-1.8 L fluid (36ml/kcal)  Burtis Junes RD, LDN, CNSC Clinical Nutrition Pager: [phone] 09/17/2017 10:48 AM

## 2017-09-17 NOTE — Progress Notes (Signed)
Kayla Kent is a 83 y.o. female [birth year] [id number]  Subjective: No new complaints. No new problems per staff.   Objective: Vital signs in last 24 hours: Temp:  [97.8 F (36.6 C)-100.2 F (37.9 C)] 100.2 F (37.9 C) (12/01 0447) Pulse Rate:  [77-91] 91 (12/01 0447) Resp:  [16-17] 17 (12/01 0447) BP: (133-151)/(55-76) 133/76 (12/01 0447) SpO2:  [97 %-98 %] 98 % (12/01 0447) Weight:  [107 lb 12.9 oz (48.9 kg)-113 lb 1.6 oz (51.3 kg)] 107 lb 12.9 oz (48.9 kg) (12/01 0447) Weight change:  Last BM Date: 09/16/17  Intake/Output from previous day: 11/30 0701 - 12/01 0700 In: 696.7 [I.V.:84.7; NG/GT:612] Out: -  Last cbgs: CBG (last 3)  Recent Labs    09/17/17 0011 09/17/17 0416 09/17/17 1144  GLUCAP 133* 119* 138*     Physical Exam General: No apparent distress   HEENT: not dry Lungs: Normal effort. Lungs clear to auscultation, no crackles or wheezes. Cardiovascular: Regular rate and rhythm, no edema Abdomen: S/NT/ND; BS(+) Musculoskeletal:  unchanged Neurological: No new neurological deficits Wounds: N/A    Skin: clear  Aging changes Mental state: somnolent    Lab Results: BMET    Component Value Date/Time   NA 140 09/17/2017 0207   NA 144 03/18/2017 1200   K 4.2 09/17/2017 0207   CL 109 09/17/2017 0207   CO2 25 09/17/2017 0207   GLUCOSE 118 (H) 09/17/2017 0207   GLUCOSE 107 (H) 10/25/2006 0906   BUN 25 (H) 09/17/2017 0207   BUN 19.8 08/23/2017 1504   CREATININE 0.60 09/17/2017 0207   CREATININE 1.3 (H) 08/23/2017 1504   CALCIUM 7.6 (L) 09/17/2017 0207   GFRNONAA >60 09/17/2017 0207   GFRAA >60 09/17/2017 0207   CBC    Component Value Date/Time   WBC 15.7 (H) 09/17/2017 0207   RBC 3.21 (L) 09/17/2017 0207   HGB 9.7 (L) 09/17/2017 0207   HGB 14.5 03/18/2017 1200   HCT 30.3 (L) 09/17/2017 0207   HCT 43.8 03/18/2017 1200   PLT 420 (H) 09/17/2017 0207   PLT 224 03/18/2017 1200   MCV 94.4 09/17/2017 0207   MCV 90 03/18/2017 1200   MCH 30.2  09/17/2017 0207   MCHC 32.0 09/17/2017 0207   RDW 15.6 (H) 09/17/2017 0207   RDW 13.9 03/18/2017 1200   LYMPHSABS 1.5 09/17/2017 0207   MONOABS 1.2 (H) 09/17/2017 0207   EOSABS 0.1 09/17/2017 0207   BASOSABS 0.0 09/17/2017 0207    Studies/Results: Dg Chest Port 1 View  Result Date: 09/17/2017 CLINICAL DATA:  Respiratory failure EXAM: PORTABLE CHEST 1 VIEW COMPARISON:  09/16/2017 FINDINGS: There is hyperinflation of the lungs compatible with COPD. Cardiomegaly. Patchy bilateral airspace disease again noted, not significantly changed. Areas of consolidation most pronounced in the left base and right upper lobe. Small effusions. IMPRESSION: COPD.  Cardiomegaly. Patchy bilateral airspace disease and small effusions, unchanged. Findings concerning for multifocal pneumonia. Electronically Signed   By: Rolm Baptise M.D.   On: 09/17/2017 07:39   Dg Chest Portable 1 View  Result Date: 09/16/2017 CLINICAL DATA:  Aspiration pneumonia EXAM: PORTABLE CHEST 1 VIEW COMPARISON:  09/15/2017 FINDINGS: Patchy bilateral airspace opacities are again noted superimposed on COPD. Cardiomegaly. No change since prior study. Small effusions, left greater than right. IMPRESSION: Attending patchy bilateral airspace disease and small effusions. COPD.  Cardiomegaly. No change. Electronically Signed   By: Rolm Baptise M.D.   On: 09/16/2017 09:42   Dg Chest Port 1 View  Result Date: 09/15/2017 CLINICAL  DATA:  83 year old female status post left MCA emergent large vessel occlusion and endovascular revascularization. EXAM: PORTABLE CHEST 1 VIEW COMPARISON:  09/13/2017 and earlier. FINDINGS: Portable AP upright view at 1306 hours. Extubated. NG type tube has been removed and an enteric feeding tube courses to the abdomen, tip not occluded. Mildly lower lung volumes. Mildly increased bilateral perihilar opacity, most apparent in the right upper lung. Continued dense opacification at the left lung base. Stable cardiac size and  mediastinal contours. Calcified aortic atherosclerosis. Stable visualized osseous structures. IMPRESSION: 1. Extubated.  NG tube removed and feeding tube placed. 2. Lower lung volumes with increasing bilateral perihilar opacity. Consider mild or developing pulmonary edema. 3. Continued dense left lung base opacity compatible with a combination of pleural effusion and lung base collapse/consolidation. Electronically Signed   By: Genevie Ann M.D.   On: 09/15/2017 13:42    Medications: I have reviewed the patient's current medications.  Assessment/Plan:   1. Right hemiparesis secondary to left MCA infarct. CIR: PT/OT 2. DVT proph - Heparin 3. Aspiration pneumonia and UTI: finished IV abx 4. Recent GI bleed: cont w/PPI 5. A fib - on Heparin. 6. B pleural effusions: monitoring wt 7. Dysphagia - on TF. NPO 8. CKD 9. Seizure disorder - on Keppra 10. Anemia due to GIB. Watching CBC 11. Somnolence - monitoring      Length of stay, days: 1  Walker Kehr , MD 09/17/2017, 1:12 PM
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
## 2017-09-17 NOTE — Progress Notes (Signed)
ANTICOAGULATION CONSULT NOTE - Follow Up Consult  Pharmacy Consult for Heparin Indication: atrial fibrillation and stroke  Patient Measurements: Height: 5\' 7"  (170.2 cm) Weight: 107 lb 12.9 oz (48.9 kg) IBW/kg (Calculated) : 61.6 Heparin Dosing Weight: 48.9 kg  Vital Signs: Temp: 100.2 F (37.9 C) (12/01 0447) Temp Source: Oral (12/01 0447) BP: 133/76 (12/01 0447) Pulse Rate: 91 (12/01 0447)  Labs: Recent Labs    09/15/17 0608  09/16/17 0430 09/16/17 1615 09/17/17 0207 09/17/17 1046  HGB 10.3*  --  9.6*  --  9.7*  --   HCT 31.8*  --  30.0*  --  30.3*  --   PLT 391  --  417*  --  420*  --   HEPARINUNFRC 0.17*   < > 0.51 0.55 0.31 0.36  CREATININE  --   --  0.57  --  0.60  --    < > = values in this interval not displayed.    Estimated Creatinine Clearance: 41.1 mL/min (by C-G formula based on SCr of 0.6 mg/dL).  Assessment:  27 YOF presented with GI bleed on 11/20 and aspiration pna, Eliquis held d/t bleed, then found with R-sided weakness and aphasia >> L-MCA infarct 2/2 afib and no tPA given d/t GI bleed.  Failed swallow 11/28 and will need AC per neuro until Eliquis can be administered, IV heparin begun on 11/28. Patient has transferred to [place] [facility] on 11/30.     Heparin level is therapeutic (0.36) on 850 units/hr.   Targeting low-therapeutic heparin levels with recent GIB and stroke.   On ASA 300 mg PR daily.   Goal of Therapy:  Heparin level 0.3-0.5 units/ml Monitor platelets by anticoagulation protocol: Yes   Plan:   Continue heparin drip at 850 units/hr.  Daily heparin level and CBC.  Eliquis on hold until able to swallow safely or has PEG per Neuro.  Arty Baumgartner, [place] Pager: [phone] 09/17/2017,12:05 PM

## 2017-09-17 NOTE — Evaluation (Addendum)
Occupational Therapy Assessment and Plan  Patient Details  Name: Kayla Kent MRN: [id number] Date of Birth: Feb 20, 1932  OT Diagnosis: cognitive deficits, hemiplegia affecting dominant side and muscle weakness (generalized) Rehab Potential: Rehab Potential (ACUTE ONLY): Fair ELOS: 23-28 days   Today's Date: 09/17/2017 OT Individual Time: 5003-7048 OT Individual Time Calculation (min): 70 min     Problem List:  Patient Active Problem List   Diagnosis Date Noted  . Acute ischemic left MCA stroke ([place]) 09/16/2017  . Cerebral thrombosis with cerebral infarction 09/13/2017  . Fever   . Aspiration pneumonia due to gastric secretions ([place])   . Leukocytosis   . Acute hypoxemic respiratory failure ([place]) 09/11/2017  . Melena   . Pressure injury of skin 09/09/2017  . Heme positive stool   . Acute upper GI bleed 09/06/2017  . HTN (hypertension) 09/06/2017  . Seizure disorder ([place]) 09/06/2017  . Chronic atrial fibrillation ([place]) 09/06/2017  . Abnormal urinalysis 09/06/2017  . Thyroid nodule 09/06/2017  . CKD (chronic kidney disease), stage III ([place]) 09/06/2017  . Dyslipidemia 09/06/2017  . COPD (chronic obstructive pulmonary disease) ([place]) 09/06/2017  . Emphysema of lung ([place]) 09/06/2017  . CKD (chronic kidney disease) stage 3, GFR 30-59 ml/min (HCC)   . Malignant neoplasm of right upper lobe of lung ([place]) 06/21/2017  . Pulmonary emphysema ([place]) 04/26/2017  . Pleural effusion 04/26/2017  . Near syncope   . Gastroesophageal reflux disease   . Chest pain 04/14/2017  . Lung nodule < 6cm on CT 04/12/2017  . Acute renal failure ([place])   . Sepsis ([place]) 04/06/2017  . Atrial fibrillation with RVR ([place]) 04/06/2017  . Urinary frequency 03/03/2017  . Hemorrhoids 03/03/2017  . Insomnia 03/03/2017  . Postoperative anemia due to acute blood loss 11/24/2016  . Hematoma left thigh 11/24/2016  . Closed femur fracture ([place]) 11/20/2016  . Pedal edema 10/10/2016  . Persistent atrial fibrillation  ([place]) 12/11/2014  . S/P ablation of atrial fibrillation 12/11/2014  . Orthostatic hypotension 12/11/2014  . Paroxysmal SVT (supraventricular tachycardia) ([place]) 12/11/2014  . Atherosclerotic peripheral vascular disease ([place]) 12/11/2014  . Paroxysmal atrial fibrillation ([place]) 11/05/2014  . Orthostasis 10/03/2014  . Non-compliant behavior 10/01/2014  . TIA (transient ischemic attack) 09/23/2014  . Numbness of left hand   . Acute on chronic diastolic heart failure ([place]) 09/21/2014  . Fracture of femoral neck, right ([place]) 08/28/2014  . Distal radius fracture, right 08/28/2014  . Hip fracture requiring operative repair ([place]) 08/28/2014  . Paroxysmal spells 03/28/2014  . Seizures ([place]) 03/28/2013  . Syncope and collapse 07/27/2012  . Routine health maintenance 12/12/2011  . PERIPHERAL CIRCULATORY DISORDER 04/10/2010  . Chronic diastolic CHF (congestive heart failure) ([place]) 07/17/2009  . Depression 07/04/2009  . Anxiety 01/19/2008  . HLD (hyperlipidemia) 11/14/2007  . Essential hypertension 11/14/2007  . PAROXYSMAL ATRIAL TACHYCARDIA 11/14/2007  . UTERINE PROLAPSE 11/14/2007    Past Medical History:  Past Medical History:  Diagnosis Date  . Allergic rhinitis   . Anxiety   . CKD (chronic kidney disease) stage 3, GFR 30-59 ml/min (HCC)   . Emphysema of lung ([place])   . HLD (hyperlipidemia)   . HTN (hypertension)   . Intolerance of drug    orthostatic  . Paroxysmal supraventricular tachycardia ([place])   . PVD (peripheral vascular disease) ([place])   . Seizure ([place])   . Syncope and collapse   . Uterine prolapse without mention of vaginal wall prolapse    Past Surgical History:  Past Surgical History:  Procedure Laterality  Date  . CARDIOVERSION N/A 11/05/2014   Procedure: CARDIOVERSION;  Surgeon: Candee Furbish, MD;  Location[facility] ENDOSCOPY;  Service: Cardiovascular;  Laterality: N/A;  . cataract surgery  08/2015  . CHOLECYSTECTOMY    . corrective eye surgery     as a child  .  ESOPHAGOGASTRODUODENOSCOPY N/A 09/09/2017   Procedure: ESOPHAGOGASTRODUODENOSCOPY (EGD);  Surgeon: Mauri Pole, MD;  [facility] END[facility];  Service: Endoscopy;  Laterality: N/A;  . INTRAMEDULLARY (IM) NAIL INTERTROCHANTERIC Left 11/21/2016   Procedure: INTRAMEDULLARY (IM) NAIL INTERTROCHANTRIC HEMI;  Surgeon: Newt Minion, MD;  Location: [place];  Service: Orthopedics;  Laterality: Left;  . IR PERCUTANEOUS ART THROMBECTOMY/INFUSION INTRACRANIAL INC DIAG ANGIO  09/11/2017  . IVD removed    . OPEN REDUCTION INTERNAL FIXATION (ORIF) DISTAL RADIAL FRACTURE Right 08/29/2014   Procedure: OPEN REDUCTION INTERNAL FIXATION (ORIF) DISTAL RADIAL FRACTURE;  Surgeon: Marianna Payment, MD;  Location: [place];  Service: Orthopedics;  Laterality: Right;  . RADIOLOGY WITH ANESTHESIA N/A 09/11/2017   Procedure: RADIOLOGY WITH ANESTHESIA;  Surgeon: Luanne Bras, MD;  Location: [place];  Service: Radiology;  Laterality: N/A;  . RF ablation PSVT     summer '10  . stress cardiolite  08/05/93  . TONSILLECTOMY    . TOTAL HIP ARTHROPLASTY Right 08/29/2014   Procedure: Right Hip Hemi Arthroplasty;  Surgeon: Marianna Payment, MD;  Location: [place];  Service: Orthopedics;  Laterality: Right;  Hip procedure 1st wants [employer], [place].     Assessment & Plan Clinical Impression: Patient is a 83 y.o. year old female with recent admission to the hospital on11/20/18 after found down and noted to be confused and agitated on eval by EMS. Work up revealed tarry stools due to UGIB, fever due to ESBL UTI and aspiration PNA, Afib with RVR as well as acute on chronic CHF. She was treated with antibiotics and started on IV amiodarone. started on failure . Eliquis held and EGD done revealing mild inflammation and Gastritis. She has progressive SOB with increased WOB 11/25 and CT chest done revealing severe multilobar PNA with moderate bilateral pleural effusions. PCCM recommended therapeutic thoracocentisis  as well as gentle diuresis. Later that day she developed right sided weakness with facial droop and code stroke initiated. CT head negative for hemorrhage and she underwent cerebral angiogram with endovascular revascularization of occluded L-MCA with retrieval device and IA integrelin.  She tolerated extubation on 11/27 and respiratory status stable. Follow up MRI brain done revealing acute ischemic infarct in left hemisphere predominantly in left pre and postcentral gyrus and frontal operculum. Cortak placed for nutritional supplement. MBS with moderate to severe dysphagia with oral holding, residue and aspiration with inability to clear residue.  Patient transferred to CIR on 09/16/2017 .    Patient currently requires total with basic self-care skills secondary to muscle weakness, decreased cardiorespiratoy endurance, abnormal tone, unbalanced muscle activation, decreased coordination and decreased motor planning, decreased attention to right, decreased initiation, decreased attention, decreased awareness, decreased problem solving and delayed processing and decreased sitting balance, decreased standing balance, decreased postural control, hemiplegia and decreased balance strategies.  Prior to hospitalization, patient could complete BADL with modified independent .  Patient will benefit from skilled intervention to decrease level of assist with basic self-care skills prior to discharge home with care partner.  Anticipate patient will require minimal physical assistance and follow up home health.  OT - End of Session Endurance Deficit: Yes Endurance Deficit Description: Multiple rest breaks with difficulty tolerating  BADLs in unsupported sitting OT Assessment Rehab Potential (ACUTE ONLY): Good OT Patient demonstrates impairments in the following area(s): Balance;Cognition;Endurance;Motor;Nutrition;Perception;Safety;Sensory;Vision OT Basic ADL's Functional Problem(s):  Grooming;Bathing;Dressing;Toileting OT Transfers Functional Problem(s): Toilet;Tub/Shower OT Additional Impairment(s): Fuctional Use of Upper Extremity OT Plan OT Intensity: Minimum of 1-2 x/day, 45 to 90 minutes OT Frequency: 5 out of 7 days OT Duration/Estimated Length of Stay: 23-28 days OT Treatment/Interventions: Balance/vestibular training;Cognitive remediation/compensation;Community reintegration;Discharge planning;DME/adaptive equipment instruction;Functional electrical stimulation;Functional mobility training;Neuromuscular re-education;Patient/family education;Self Care/advanced ADL retraining;Therapeutic Activities;Therapeutic Exercise;Splinting/orthotics;UE/LE Strength taining/ROM;UE/LE Coordination activities;Visual/perceptual remediation/compensation;Wheelchair propulsion/positioning OT Basic Self-Care Anticipated Outcome(s): Min A OT Toileting Anticipated Outcome(s): Min A OT Bathroom Transfers Anticipated Outcome(s): Min A OT Recommendation Patient destination: Home(vs SNF) Follow Up Recommendations: Home health OT;Skilled nursing facility(HHOT vs. SNF pending progress) Equipment Recommended: To be determined   Skilled Therapeutic Intervention OT eval completed addressing rehab process, OT purpose, POC, ELOS, and goals.  Limited ADL assessment secondary to decreased arousal. Pt able to follow simple one step commands throughout session to assist with some BADL Utilized environmental changes to increase arousal, then total A to transfer to sitting EOB. Increased arousal in sitting position. Pt able to wash chest, abdomen and R arm with instructional cues and min A/supervision for static sitting balance. R NMR techniques incorporated into bathing with hand over hand assist to grasp wash cloth and push/pull on legs. Pt returned to supine for total A LB bathing/dressing. Pt was able to perform small hip bridge with OT assist at knees as OT pulled pants over hips. Pt brought to sitting  EOB for 2nd  Time and tolerated sitting for 2 mins. R UE NMR focused on elbow flex/ext, shoulder flext/ext, and wrist pronation/supination.  Pt left semi-reclined in bed with needs met and bed alarm on.    OT Evaluation Precautions/Restrictions  Precautions Precautions: Fall Precaution Comments: R hemiplegia Restrictions Weight Bearing Restrictions: No Pain  none/denies pain Home Living/Prior Functioning Home Living Family/patient expects to be discharged to:: Private residence Living Arrangements: Alone Available Help at Discharge: Family Type of Home: House Home Access: Stairs to enter Technical brewer of Steps: 1 Entrance Stairs-Rails: None Home Layout: One level Bathroom Shower/Tub: Multimedia programmer: Handicapped height Bathroom Accessibility: Yes Additional Comments: per chart review Prior Function Level of Independence: Independent with basic ADLs, Independent with homemaking with ambulation ADL ADL ADL Comments: Please see functional navigator Vision Vision Assessment?: Yes Eye Alignment: Impaired (comment)(R eye laterally rotated) Ocular Range of Motion: Impaired-to be further tested in functional context Alignment/Gaze Preference: Gaze left Tracking/Visual Pursuits: Impaired - to be further tested in functional context(left gaze preference) Visual Fields: Impaired-to be further tested in functional context(possible R visual field deficit) Perception  Perception: Impaired Inattention/Neglect: Does not attend to right side of body;Impaired-to be further tested in functional context Praxis Praxis: Impaired Praxis Impairment Details: Motor planning;Ideomotor;Initiation;Perseveration Cognition Overall Cognitive Status: Impaired/Different from baseline Arousal/Alertness: Lethargic Orientation Level: Nonverbal/unable to assess Memory: (unable to assess) Comments: Unable to assess 2/2 decreased alertness and aphasia   Sensation Sensation Additional Comments: unable to assess Coordination Gross Motor Movements are Fluid and Coordinated: No Fine Motor Movements are Fluid and Coordinated: No Coordination and Movement Description: slow movements Motor  Motor Motor: Hemiplegia;Abnormal tone;Motor apraxia;Abnormal postural alignment and control;Motor perseverations Mobility  Bed Mobility Bed Mobility: Supine to Sit;Sit to Supine Supine to Sit: 1: +1 Total assist Supine to Sit Details: Manual facilitation for weight shifting;Manual facilitation for placement;Manual facilitation for weight bearing;Tactile cues for sequencing;Tactile cues for weight shifting;Tactile cues for placement;Tactile cues for  posture Sit to Supine: 1: +1 Total assist Sit to Supine: Patient Percentage: 10%  Balance Static Sitting Balance Static Sitting - Balance Support: Left upper extremity supported;Feet supported Static Sitting - Level of Assistance: 5: Stand by assistance;4: Min assist Dynamic Sitting Balance Dynamic Sitting - Balance Support: During functional activity Dynamic Sitting - Level of Assistance: 4: Min assist Sitting balance - Comments: Min gaurd A without UE support while washing chest,  Extremity/Trunk Assessment RUE Assessment RUE Assessment: Exceptions to [facility] RUE Tone RUE Tone: Modified Ashworth Modified Ashworth Scale for Grading Hypertonia RUE: Slight increase in muscle tone, manifested by a catch and release or by minimal resistance at the end of the range of motion when the affected part(s) is moved in flexion or extension LUE Assessment LUE Assessment: Within Functional Limits(strnegth grossly 4-/5)   See Function Navigator for Current Functional Status.   Refer to Care Plan for Long Term Goals  Recommendations for other services: Other: none at this time   Discharge Criteria: Patient will be discharged from OT if patient refuses treatment 3 consecutive times without medical reason, if treatment  goals not met, if there is a change in medical status, if patient makes no progress towards goals or if patient is discharged from hospital.  The above assessment, treatment plan, treatment alternatives and goals were discussed and mutually agreed upon: by patient  Valma Cava 09/17/2017, 12:49 PM
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
## 2017-09-18 ENCOUNTER — Other Ambulatory Visit: Payer: Self-pay

## 2017-09-18 ENCOUNTER — Inpatient Hospital Stay (HOSPITAL_COMMUNITY): Payer: Medicare Other | Admitting: Physical Therapy

## 2017-09-18 LAB — CBC
HEMATOCRIT: 30.1 % — AB (ref 36.0–46.0)
HEMOGLOBIN: 9.5 g/dL — AB (ref 12.0–15.0)
MCH: 30.2 pg (ref 26.0–34.0)
MCHC: 31.6 g/dL (ref 30.0–36.0)
MCV: 95.6 fL (ref 78.0–100.0)
Platelets: 451 10*3/uL — ABNORMAL HIGH (ref 150–400)
RBC: 3.15 MIL/uL — AB (ref 3.87–5.11)
RDW: 15.9 % — AB (ref 11.5–15.5)
WBC: 16.4 10*3/uL — AB (ref 4.0–10.5)

## 2017-09-18 LAB — HEPARIN LEVEL (UNFRACTIONATED)
HEPARIN UNFRACTIONATED: 0.19 [IU]/mL — AB (ref 0.30–0.70)
HEPARIN UNFRACTIONATED: 0.39 [IU]/mL (ref 0.30–0.70)
Heparin Unfractionated: 0.19 IU/mL — ABNORMAL LOW (ref 0.30–0.70)

## 2017-09-18 LAB — GLUCOSE, CAPILLARY
GLUCOSE-CAPILLARY: 101 mg/dL — AB (ref 65–99)
GLUCOSE-CAPILLARY: 124 mg/dL — AB (ref 65–99)
GLUCOSE-CAPILLARY: 130 mg/dL — AB (ref 65–99)
GLUCOSE-CAPILLARY: 98 mg/dL (ref 65–99)
Glucose-Capillary: 87 mg/dL (ref 65–99)
Glucose-Capillary: 105 mg/dL — ABNORMAL HIGH (ref 65–99)

## 2017-09-18 MED ORDER — ASPIRIN 325 MG PO TABS: 325.0000 mg | ORAL_TABLET | Freq: Every day | ORAL | Status: DC

## 2017-09-18 MED ORDER — ATORVASTATIN CALCIUM 20 MG PO TABS
20.0000 mg | ORAL_TABLET | Freq: Every day | ORAL | Status: DC
Start: 2017-09-18 — End: 2017-09-19
  Administered 2017-09-18: 20 mg
  Filled 2017-09-18: qty 1

## 2017-09-18 NOTE — Progress Notes (Signed)
Physical Therapy Session Note  Patient Details  Name: Kayla Kent MRN: [id number] Date of Birth: February 10, 1932  Today's Date: 09/18/2017 PT Individual Time: 0800-0900 PT Individual Time Calculation (min): 60 min   Short Term Goals: Week 1:     Skilled Therapeutic Interventions/Progress Updates:  Pt was seen bedside in the am. Pt will respond verbally to questions with increased processing time inconsistently. Pt performed bed mobility with max A and verbal cues. Pt performed bed to chair transfers with max A and verbal cues. Treatment in gym focused on NMR. Pt performed multiple sit to stand transfers with max A. While standing focused on weight shifting, upright posture and midline. Pt returned to room following treatment. Pt left sitting up in bed with call bell within reach and bed alarm on.   Therapy Documentation Precautions:  Precautions Precautions: Fall Precaution Comments: R hemiplegia Restrictions Weight Bearing Restrictions: No General:   Vital Signs:  Pain: Pain Assessment Pain Assessment: 0-10 Pain Score: 0-No pain   See Function Navigator for Current Functional Status.   Therapy/Group: Individual Therapy  Dub Amis 09/18/2017, 12:22 PM

## 2017-09-18 NOTE — Progress Notes (Signed)
ANTICOAGULATION CONSULT NOTE - Follow Up Consult  Pharmacy Consult for Heparin Indication: atrial fibrillation and stroke  Patient Measurements: Height: 5\' 7"  (170.2 cm) Weight: 103 lb 9.9 oz (47 kg) IBW/kg (Calculated) : 61.6 Heparin Dosing Weight: 48.9 kg  Vital Signs: Temp: 97.7 F (36.5 C) (12/02 0448) Temp Source: Oral (12/02 0448) BP: 138/53 (12/02 0448) Pulse Rate: 76 (12/02 0448)  Labs: Recent Labs    09/16/17 0430  09/17/17 0207 09/17/17 1046 09/18/17 0448 09/18/17 1325  HGB 9.6*  --  9.7*  --  9.5*  --   HCT 30.0*  --  30.3*  --  30.1*  --   PLT 417*  --  420*  --  451*  --   HEPARINUNFRC 0.51   < > 0.31 0.36 0.19* 0.19*  CREATININE 0.57  --  0.60  --   --   --    < > = values in this interval not displayed.    Estimated Creatinine Clearance: 39.5 mL/min (by C-G formula based on SCr of 0.6 mg/dL).  Assessment:  55 YOF presented with GI bleed on 11/20 and aspiration pna, Eliquis held d/t bleed, then found with R-sided weakness and aphasia >> L-MCA infarct 2/2 afib and no tPA given d/t GI bleed.  Failed swallow 11/28 and will need AC per neuro until Eliquis can be administered, IV heparin begun on 11/28. Patient has transferred to [facility] on 11/30.     Heparin level remains low (0.19) on 900 units/hr, after increase from 850 units/hr this morning.  No infusion problems or bleeding reported.   Targeting low-therapeutic heparin levels with recent GIB and stroke.  Goal of Therapy:  Heparin level 0.3-0.5 units/ml Monitor platelets by anticoagulation protocol: Yes   Plan:   Increase heparin drip to 1000 units/hr.  Heparin level ~6-8 hrs after rate change.  Daily heparin level and CBC.  Eliquis on hold until able to swallow safely or has PEG per Neuro.  Changed PR ASA to per tube. D/w RN.  Arty Baumgartner, [place] Pager: [phone] 09/18/2017,3:57 PM

## 2017-09-18 NOTE — Progress Notes (Addendum)
ANTICOAGULATION CONSULT NOTE - Follow Up Consult  Pharmacy Consult for heparin Indication: atrial fibrillation and stroke  Labs: Recent Labs    09/16/17 0430  09/17/17 0207  09/18/17 0448 09/18/17 1325 09/18/17 2233  HGB 9.6*  --  9.7*  --  9.5*  --   --   HCT 30.0*  --  30.3*  --  30.1*  --   --   PLT 417*  --  420*  --  451*  --   --   HEPARINUNFRC 0.51   < > 0.31   < > 0.19* 0.19* 0.39  CREATININE 0.57  --  0.60  --   --   --   --    < > = values in this interval not displayed.    Assessment/Plan:  83yo female therapeutic on heparin after rate changes. Will continue gtt at current rate and confirm stable with am labs.   Wynona Neat, PharmD, BCPS  09/18/2017,11:31 PM   ADDENDUM: Repeat heparin level now below goal (0.29); will increase heparin gtt by 10% to 1100 units/hr and check level in 8hr. VB   09/19/2017 3:00 AM

## 2017-09-18 NOTE — Progress Notes (Signed)
Kayla Kent is a 83 y.o. female 1932/04/25 [id number]  Subjective: No new complaints. No new problems per staff  Objective: Vital signs in last 24 hours: Temp:  [97.7 F (36.5 C)-98.7 F (37.1 C)] 98.7 F (37.1 C) (12/02 1528) Pulse Rate:  [76-99] 99 (12/02 1528) Resp:  [17-18] 17 (12/02 1528) BP: (138-143)/(50-53) 143/50 (12/02 1528) SpO2:  [96 %-97 %] 96 % (12/02 1528) Weight:  [103 lb 9.9 oz (47 kg)] 103 lb 9.9 oz (47 kg) (12/02 0448) Weight change: -7.7 oz (-4.302 kg) Last BM Date: 09/16/17  Intake/Output from previous day: No intake/output data recorded. Last cbgs: CBG (last 3)  Recent Labs    09/18/17 1202 09/18/17 1628 09/18/17 1949  GLUCAP 98 87 105*     Physical Exam General: No apparent distress   HEENT: not dry Lungs: Normal effort. Lungs clear to auscultation, no crackles or wheezes. Cardiovascular: irregular rate and rhythm, no edema Abdomen: S/NT/ND; BS(+) Musculoskeletal:  unchanged Neurological: No new neurological deficits Wounds: N/A    Skin: clear  Aging changes Mental state: Alert, cooperative. In a w/c    Lab Results: BMET    Component Value Date/Time   NA 140 09/17/2017 0207   NA 144 03/18/2017 1200   K 4.2 09/17/2017 0207   CL 109 09/17/2017 0207   CO2 25 09/17/2017 0207   GLUCOSE 118 (H) 09/17/2017 0207   GLUCOSE 107 (H) 10/25/2006 0906   BUN 25 (H) 09/17/2017 0207   BUN 19.8 08/23/2017 1504   CREATININE 0.60 09/17/2017 0207   CREATININE 1.3 (H) 08/23/2017 1504   CALCIUM 7.6 (L) 09/17/2017 0207   GFRNONAA >60 09/17/2017 0207   GFRAA >60 09/17/2017 0207   CBC    Component Value Date/Time   WBC 16.4 (H) 09/18/2017 0448   RBC 3.15 (L) 09/18/2017 0448   HGB 9.5 (L) 09/18/2017 0448   HGB 14.5 03/18/2017 1200   HCT 30.1 (L) 09/18/2017 0448   HCT 43.8 03/18/2017 1200   PLT 451 (H) 09/18/2017 0448   PLT 224 03/18/2017 1200   MCV 95.6 09/18/2017 0448   MCV 90 03/18/2017 1200   MCH 30.2 09/18/2017 0448   MCHC 31.6  09/18/2017 0448   RDW 15.9 (H) 09/18/2017 0448   RDW 13.9 03/18/2017 1200   LYMPHSABS 1.5 09/17/2017 0207   MONOABS 1.2 (H) 09/17/2017 0207   EOSABS 0.1 09/17/2017 0207   BASOSABS 0.0 09/17/2017 0207    Studies/Results: Dg Chest Port 1 View  Result Date: 09/17/2017 CLINICAL DATA:  Respiratory failure EXAM: PORTABLE CHEST 1 VIEW COMPARISON:  09/16/2017 FINDINGS: There is hyperinflation of the lungs compatible with COPD. Cardiomegaly. Patchy bilateral airspace disease again noted, not significantly changed. Areas of consolidation most pronounced in the left base and right upper lobe. Small effusions. IMPRESSION: COPD.  Cardiomegaly. Patchy bilateral airspace disease and small effusions, unchanged. Findings concerning for multifocal pneumonia. Electronically Signed   By: Rolm Baptise M.D.   On: 09/17/2017 07:39    Medications: I have reviewed the patient's current medications.  Assessment/Plan:    1.  Right hemiparesis due to left MCA infarct.  Continue with physical and occupational therapy. 2.  DVT prophylaxis with heparin. 3.  Aspiration pneumonia and a urinary tract infection.  The patient has finished IV antibiotics. 4.  Recent gastrointestinal bleeding.  Continue with proton pump inhibitor. 5.  Atrial fibrillation.  Continue with heparin. 6.  Bilateral pleural effusions.  Will monitor weights. 7.  Dysphagia.  Continue with tube feeding. NPO. 8.  Chronic  renal disease. 9.  Anemia due to previous gastrointestinal bleeding.  Monitoring CBC.  10. Seizure disorder.  On Keppra. 11.  Periodic somnolence.  The patient is alert this morning.    Length of stay, days: 2  Walker Kehr , MD 09/18/2017, 8:49 PM

## 2017-09-18 NOTE — Progress Notes (Signed)
ANTICOAGULATION CONSULT NOTE - Follow Up Consult  Pharmacy Consult for heparin Indication: atrial fibrillation and stroke  Labs: Recent Labs    09/16/17 0430  09/17/17 0207 09/17/17 1046 09/18/17 0448  HGB 9.6*  --  9.7*  --  9.5*  HCT 30.0*  --  30.3*  --  30.1*  PLT 417*  --  420*  --  451*  HEPARINUNFRC 0.51   < > 0.31 0.36 0.19*  CREATININE 0.57  --  0.60  --   --    < > = values in this interval not displayed.    Assessment: 83yo female now below goal on heparin after two levels at goal; no gtt issues or signs of bleeding per RN.  Goal of Therapy:  Heparin level 0.3-0.5 units/ml   Plan:  Will increase heparin gtt cautiously to 900 units/hr and check level in [place], PharmD, BCPS  09/18/2017,5:59 AM

## 2017-09-19 ENCOUNTER — Inpatient Hospital Stay (HOSPITAL_COMMUNITY): Payer: Medicare Other

## 2017-09-19 ENCOUNTER — Inpatient Hospital Stay (HOSPITAL_COMMUNITY): Payer: Medicare Other | Admitting: Occupational Therapy

## 2017-09-19 ENCOUNTER — Inpatient Hospital Stay (HOSPITAL_COMMUNITY)
Admission: AD | Admit: 2017-09-19 | Discharge: 2017-09-28 | DRG: 177 | Disposition: A | Discharge status: Rehabilitation Facility | Payer: Medicare Other | Source: Other Acute Inpatient Hospital | Attending: Internal Medicine | Admitting: Internal Medicine

## 2017-09-19 ENCOUNTER — Inpatient Hospital Stay (HOSPITAL_COMMUNITY): Payer: Medicare Other | Admitting: Physical Therapy

## 2017-09-19 ENCOUNTER — Encounter (HOSPITAL_COMMUNITY): Payer: Self-pay

## 2017-09-19 DIAGNOSIS — I482 Chronic atrial fibrillation, unspecified: Secondary | ICD-10-CM | POA: Diagnosis present

## 2017-09-19 DIAGNOSIS — R079 Chest pain, unspecified: Secondary | ICD-10-CM | POA: Diagnosis not present

## 2017-09-19 DIAGNOSIS — E785 Hyperlipidemia, unspecified: Secondary | ICD-10-CM | POA: Diagnosis present

## 2017-09-19 DIAGNOSIS — F329 Major depressive disorder, single episode, unspecified: Secondary | ICD-10-CM | POA: Diagnosis present

## 2017-09-19 DIAGNOSIS — I5032 Chronic diastolic (congestive) heart failure: Secondary | ICD-10-CM | POA: Diagnosis present

## 2017-09-19 DIAGNOSIS — E041 Nontoxic single thyroid nodule: Secondary | ICD-10-CM | POA: Diagnosis present

## 2017-09-19 DIAGNOSIS — N814 Uterovaginal prolapse, unspecified: Secondary | ICD-10-CM | POA: Diagnosis present

## 2017-09-19 DIAGNOSIS — K219 Gastro-esophageal reflux disease without esophagitis: Secondary | ICD-10-CM | POA: Diagnosis present

## 2017-09-19 DIAGNOSIS — Z8249 Family history of ischemic heart disease and other diseases of the circulatory system: Secondary | ICD-10-CM

## 2017-09-19 DIAGNOSIS — I130 Hypertensive heart and chronic kidney disease with heart failure and stage 1 through stage 4 chronic kidney disease, or unspecified chronic kidney disease: Secondary | ICD-10-CM | POA: Diagnosis present

## 2017-09-19 DIAGNOSIS — Z8719 Personal history of other diseases of the digestive system: Secondary | ICD-10-CM

## 2017-09-19 DIAGNOSIS — Z79899 Other long term (current) drug therapy: Secondary | ICD-10-CM

## 2017-09-19 DIAGNOSIS — I639 Cerebral infarction, unspecified: Secondary | ICD-10-CM | POA: Diagnosis not present

## 2017-09-19 DIAGNOSIS — R64 Cachexia: Secondary | ICD-10-CM | POA: Diagnosis present

## 2017-09-19 DIAGNOSIS — E43 Unspecified severe protein-calorie malnutrition: Secondary | ICD-10-CM | POA: Diagnosis present

## 2017-09-19 DIAGNOSIS — R1310 Dysphagia, unspecified: Secondary | ICD-10-CM

## 2017-09-19 DIAGNOSIS — D75839 Thrombocytosis, unspecified: Secondary | ICD-10-CM

## 2017-09-19 DIAGNOSIS — I69391 Dysphagia following cerebral infarction: Secondary | ICD-10-CM | POA: Diagnosis not present

## 2017-09-19 DIAGNOSIS — I69320 Aphasia following cerebral infarction: Secondary | ICD-10-CM | POA: Diagnosis not present

## 2017-09-19 DIAGNOSIS — I480 Paroxysmal atrial fibrillation: Secondary | ICD-10-CM | POA: Diagnosis not present

## 2017-09-19 DIAGNOSIS — N183 Chronic kidney disease, stage 3 unspecified: Secondary | ICD-10-CM

## 2017-09-19 DIAGNOSIS — I4891 Unspecified atrial fibrillation: Secondary | ICD-10-CM | POA: Diagnosis not present

## 2017-09-19 DIAGNOSIS — Z7982 Long term (current) use of aspirin: Secondary | ICD-10-CM

## 2017-09-19 DIAGNOSIS — J449 Chronic obstructive pulmonary disease, unspecified: Secondary | ICD-10-CM | POA: Diagnosis present

## 2017-09-19 DIAGNOSIS — E873 Alkalosis: Secondary | ICD-10-CM | POA: Diagnosis present

## 2017-09-19 DIAGNOSIS — J690 Pneumonitis due to inhalation of food and vomit: Secondary | ICD-10-CM | POA: Diagnosis not present

## 2017-09-19 DIAGNOSIS — Z4659 Encounter for fitting and adjustment of other gastrointestinal appliance and device: Secondary | ICD-10-CM

## 2017-09-19 DIAGNOSIS — F419 Anxiety disorder, unspecified: Secondary | ICD-10-CM | POA: Diagnosis present

## 2017-09-19 DIAGNOSIS — J309 Allergic rhinitis, unspecified: Secondary | ICD-10-CM | POA: Diagnosis present

## 2017-09-19 DIAGNOSIS — E871 Hypo-osmolality and hyponatremia: Secondary | ICD-10-CM | POA: Diagnosis not present

## 2017-09-19 DIAGNOSIS — I471 Supraventricular tachycardia: Secondary | ICD-10-CM | POA: Diagnosis not present

## 2017-09-19 DIAGNOSIS — J9601 Acute respiratory failure with hypoxia: Secondary | ICD-10-CM

## 2017-09-19 DIAGNOSIS — I10 Essential (primary) hypertension: Secondary | ICD-10-CM | POA: Diagnosis present

## 2017-09-19 DIAGNOSIS — J9691 Respiratory failure, unspecified with hypoxia: Secondary | ICD-10-CM | POA: Diagnosis not present

## 2017-09-19 DIAGNOSIS — Z880 Allergy status to penicillin: Secondary | ICD-10-CM

## 2017-09-19 DIAGNOSIS — I69392 Facial weakness following cerebral infarction: Secondary | ICD-10-CM | POA: Diagnosis not present

## 2017-09-19 DIAGNOSIS — K922 Gastrointestinal hemorrhage, unspecified: Secondary | ICD-10-CM | POA: Diagnosis not present

## 2017-09-19 DIAGNOSIS — K5900 Constipation, unspecified: Secondary | ICD-10-CM | POA: Diagnosis not present

## 2017-09-19 DIAGNOSIS — R569 Unspecified convulsions: Secondary | ICD-10-CM | POA: Diagnosis not present

## 2017-09-19 DIAGNOSIS — I700 Atherosclerosis of aorta: Secondary | ICD-10-CM | POA: Diagnosis present

## 2017-09-19 DIAGNOSIS — Z681 Body mass index (BMI) 19 or less, adult: Secondary | ICD-10-CM | POA: Diagnosis not present

## 2017-09-19 DIAGNOSIS — Z91010 Allergy to peanuts: Secondary | ICD-10-CM

## 2017-09-19 DIAGNOSIS — I6330 Cerebral infarction due to thrombosis of unspecified cerebral artery: Secondary | ICD-10-CM | POA: Diagnosis present

## 2017-09-19 DIAGNOSIS — E876 Hypokalemia: Secondary | ICD-10-CM | POA: Diagnosis present

## 2017-09-19 DIAGNOSIS — R4189 Other symptoms and signs involving cognitive functions and awareness: Secondary | ICD-10-CM | POA: Diagnosis present

## 2017-09-19 DIAGNOSIS — Z818 Family history of other mental and behavioral disorders: Secondary | ICD-10-CM | POA: Diagnosis not present

## 2017-09-19 DIAGNOSIS — I63512 Cerebral infarction due to unspecified occlusion or stenosis of left middle cerebral artery: Secondary | ICD-10-CM | POA: Diagnosis not present

## 2017-09-19 DIAGNOSIS — R739 Hyperglycemia, unspecified: Secondary | ICD-10-CM | POA: Diagnosis present

## 2017-09-19 DIAGNOSIS — Z87891 Personal history of nicotine dependence: Secondary | ICD-10-CM

## 2017-09-19 DIAGNOSIS — E440 Moderate protein-calorie malnutrition: Secondary | ICD-10-CM | POA: Diagnosis present

## 2017-09-19 DIAGNOSIS — Z515 Encounter for palliative care: Secondary | ICD-10-CM | POA: Diagnosis not present

## 2017-09-19 DIAGNOSIS — I739 Peripheral vascular disease, unspecified: Secondary | ICD-10-CM | POA: Diagnosis present

## 2017-09-19 DIAGNOSIS — N179 Acute kidney failure, unspecified: Secondary | ICD-10-CM | POA: Diagnosis present

## 2017-09-19 DIAGNOSIS — I472 Ventricular tachycardia: Secondary | ICD-10-CM | POA: Diagnosis not present

## 2017-09-19 DIAGNOSIS — Z91041 Radiographic dye allergy status: Secondary | ICD-10-CM

## 2017-09-19 DIAGNOSIS — D473 Essential (hemorrhagic) thrombocythemia: Secondary | ICD-10-CM

## 2017-09-19 DIAGNOSIS — R0602 Shortness of breath: Secondary | ICD-10-CM

## 2017-09-19 DIAGNOSIS — Z91040 Latex allergy status: Secondary | ICD-10-CM

## 2017-09-19 DIAGNOSIS — E872 Acidosis: Secondary | ICD-10-CM | POA: Diagnosis not present

## 2017-09-19 DIAGNOSIS — F32A Depression, unspecified: Secondary | ICD-10-CM | POA: Diagnosis present

## 2017-09-19 DIAGNOSIS — I340 Nonrheumatic mitral (valve) insufficiency: Secondary | ICD-10-CM | POA: Diagnosis present

## 2017-09-19 DIAGNOSIS — R0789 Other chest pain: Secondary | ICD-10-CM | POA: Diagnosis not present

## 2017-09-19 DIAGNOSIS — Z888 Allergy status to other drugs, medicaments and biological substances status: Secondary | ICD-10-CM

## 2017-09-19 DIAGNOSIS — Z9049 Acquired absence of other specified parts of digestive tract: Secondary | ICD-10-CM

## 2017-09-19 DIAGNOSIS — D62 Acute posthemorrhagic anemia: Secondary | ICD-10-CM | POA: Diagnosis present

## 2017-09-19 DIAGNOSIS — I69351 Hemiplegia and hemiparesis following cerebral infarction affecting right dominant side: Secondary | ICD-10-CM

## 2017-09-19 DIAGNOSIS — Z882 Allergy status to sulfonamides status: Secondary | ICD-10-CM

## 2017-09-19 DIAGNOSIS — R918 Other nonspecific abnormal finding of lung field: Secondary | ICD-10-CM | POA: Diagnosis not present

## 2017-09-19 DIAGNOSIS — J189 Pneumonia, unspecified organism: Secondary | ICD-10-CM

## 2017-09-19 DIAGNOSIS — G40909 Epilepsy, unspecified, not intractable, without status epilepticus: Secondary | ICD-10-CM

## 2017-09-19 DIAGNOSIS — Z8349 Family history of other endocrine, nutritional and metabolic diseases: Secondary | ICD-10-CM

## 2017-09-19 DIAGNOSIS — R32 Unspecified urinary incontinence: Secondary | ICD-10-CM | POA: Diagnosis present

## 2017-09-19 DIAGNOSIS — R1312 Dysphagia, oropharyngeal phase: Secondary | ICD-10-CM | POA: Diagnosis present

## 2017-09-19 DIAGNOSIS — Z96641 Presence of right artificial hip joint: Secondary | ICD-10-CM | POA: Diagnosis present

## 2017-09-19 DIAGNOSIS — Z91018 Allergy to other foods: Secondary | ICD-10-CM

## 2017-09-19 DIAGNOSIS — Z885 Allergy status to narcotic agent status: Secondary | ICD-10-CM

## 2017-09-19 DIAGNOSIS — I509 Heart failure, unspecified: Secondary | ICD-10-CM | POA: Diagnosis not present

## 2017-09-19 DIAGNOSIS — C3411 Malignant neoplasm of upper lobe, right bronchus or lung: Secondary | ICD-10-CM | POA: Diagnosis not present

## 2017-09-19 LAB — TROPONIN I
Troponin I: 0.04 ng/mL (ref <0.03)
Troponin I: 0.03 ng/mL (ref <0.03)
Troponin I: 0.04 ng/mL
Troponin I: 0.04 ng/mL

## 2017-09-19 LAB — URINALYSIS, ROUTINE W REFLEX MICROSCOPIC
Bilirubin Urine: NEGATIVE
Glucose, UA: NEGATIVE mg/dL
Hgb urine dipstick: NEGATIVE
Ketones, ur: NEGATIVE mg/dL
Leukocytes, UA: NEGATIVE
Nitrite: NEGATIVE
Protein, ur: NEGATIVE mg/dL
Specific Gravity, Urine: 1.006 (ref 1.005–1.030)
pH: 7 (ref 5.0–8.0)

## 2017-09-19 LAB — BASIC METABOLIC PANEL
Anion gap: 8 (ref 5–15)
BUN: 19 mg/dL (ref 6–20)
CALCIUM: 8.1 mg/dL — AB (ref 8.9–10.3)
CHLORIDE: 103 mmol/L (ref 101–111)
CO2: 26 mmol/L (ref 22–32)
CREATININE: 0.72 mg/dL (ref 0.44–1.00)
GFR calc Af Amer: >60 mL/min (ref >60)
GFR calc non Af Amer: >60 mL/min (ref >60)
GLUCOSE: 109 mg/dL — AB (ref 65–99)
Potassium: 4.2 mmol/L (ref 3.5–5.1)
Sodium: 137 mmol/L (ref 135–145)

## 2017-09-19 LAB — BLOOD GAS, ARTERIAL
Acid-Base Excess: 3.8 mmol/L — ABNORMAL HIGH (ref 0.0–2.0)
Bicarbonate: 26.9 mmol/L (ref 20.0–28.0)
Drawn by: 35849
O2 Content: 3 L/min
O2 SAT: 98.2 %
PATIENT TEMPERATURE: 98.8
PCO2 ART: 34.3 mmHg (ref 32.0–48.0)
PO2 ART: 125 mmHg — AB (ref 83.0–108.0)
pH, Arterial: 7.506 — ABNORMAL HIGH (ref 7.350–7.450)

## 2017-09-19 LAB — CBC WITH DIFFERENTIAL/PLATELET
Basophils Absolute: 0 10*3/uL (ref 0.0–0.1)
Basophils Relative: 0 %
Eosinophils Absolute: 0.2 10*3/uL (ref 0.0–0.7)
Eosinophils Relative: 1 %
HEMATOCRIT: 28.4 % — AB (ref 36.0–46.0)
HEMOGLOBIN: 9.1 g/dL — AB (ref 12.0–15.0)
LYMPHS ABS: 1.8 10*3/uL (ref 0.7–4.0)
LYMPHS PCT: 12 %
MCH: 30.4 pg (ref 26.0–34.0)
MCHC: 32 g/dL (ref 30.0–36.0)
MCV: 95 fL (ref 78.0–100.0)
MONOS PCT: 8 %
Monocytes Absolute: 1.2 10*3/uL — ABNORMAL HIGH (ref 0.1–1.0)
NEUTROS PCT: 79 %
Neutro Abs: 11.4 10*3/uL — ABNORMAL HIGH (ref 1.7–7.7)
Platelets: 417 10*3/uL — ABNORMAL HIGH (ref 150–400)
RBC: 2.99 MIL/uL — AB (ref 3.87–5.11)
RDW: 15.8 % — ABNORMAL HIGH (ref 11.5–15.5)
WBC: 14.6 10*3/uL — AB (ref 4.0–10.5)

## 2017-09-19 LAB — INFLUENZA PANEL BY PCR (TYPE A & B)
INFLAPCR: NEGATIVE
INFLBPCR: NEGATIVE

## 2017-09-19 LAB — GLUCOSE, CAPILLARY
GLUCOSE-CAPILLARY: 112 mg/dL — AB (ref 65–99)
Glucose-Capillary: 95 mg/dL (ref 65–99)
Glucose-Capillary: 92 mg/dL (ref 65–99)

## 2017-09-19 LAB — LACTIC ACID, PLASMA
LACTIC ACID, VENOUS: 1.6 mmol/L (ref 0.5–1.9)
Lactic Acid, Venous: 1.3 mmol/L (ref 0.5–1.9)

## 2017-09-19 LAB — HEPARIN LEVEL (UNFRACTIONATED)
HEPARIN UNFRACTIONATED: 0.63 [IU]/mL (ref 0.30–0.70)
Heparin Unfractionated: 0.29 IU/mL — ABNORMAL LOW (ref 0.30–0.70)
Heparin Unfractionated: 0.59 IU/mL (ref 0.30–0.70)

## 2017-09-19 LAB — PROCALCITONIN: PROCALCITONIN: 0.13 ng/mL

## 2017-09-19 LAB — BRAIN NATRIURETIC PEPTIDE: B Natriuretic Peptide: 637.1 pg/mL — ABNORMAL HIGH (ref 0.0–100.0)

## 2017-09-19 MED ORDER — ACETAMINOPHEN 650 MG RE SUPP: 650.0000 mg | RECTAL | Status: DC | PRN

## 2017-09-19 MED ORDER — PANTOPRAZOLE SODIUM 40 MG IV SOLR
40.0000 mg | Freq: Two times a day (BID) | INTRAVENOUS | Status: DC
  Administered 2017-09-19 – 2017-09-27 (×15): 40 mg via INTRAVENOUS
  Filled 2017-09-19 (×15): qty 40

## 2017-09-19 MED ORDER — POTASSIUM CHLORIDE 2 MEQ/ML IV SOLN: INTRAVENOUS | Status: DC

## 2017-09-19 MED ORDER — FUROSEMIDE 10 MG/ML IJ SOLN
40.0000 mg | Freq: Once | INTRAMUSCULAR | Status: AC
  Administered 2017-09-19: 40 mg via INTRAVENOUS
  Filled 2017-09-19: qty 4

## 2017-09-19 MED ORDER — SODIUM CHLORIDE 0.9 % IV SOLN
250.0000 mg | Freq: Two times a day (BID) | INTRAVENOUS | Status: DC
  Administered 2017-09-19 – 2017-09-20 (×2): 250 mg via INTRAVENOUS
  Filled 2017-09-19 (×2): qty 2.5

## 2017-09-19 MED ORDER — LEVOFLOXACIN IN D5W 500 MG/100ML IV SOLN
500.0000 mg | INTRAVENOUS | Status: DC
  Administered 2017-09-20: 500 mg via INTRAVENOUS
  Filled 2017-09-19: qty 100

## 2017-09-19 MED ORDER — DEXTROSE-NACL 5-0.45 % IV SOLN
INTRAVENOUS | Status: AC
  Administered 2017-09-19: 15:00:00 via INTRAVENOUS

## 2017-09-19 MED ORDER — ONDANSETRON HCL 4 MG/2ML IJ SOLN: 4.0000 mg | Freq: Four times a day (QID) | INTRAMUSCULAR | Status: DC | PRN

## 2017-09-19 MED ORDER — PANTOPRAZOLE SODIUM 40 MG IV SOLR
40.0000 mg | Freq: Two times a day (BID) | INTRAVENOUS | Status: DC
  Administered 2017-09-19: 40 mg via INTRAVENOUS
  Filled 2017-09-19: qty 40

## 2017-09-19 MED ORDER — SODIUM CHLORIDE 0.9 % IV SOLN
250.0000 mg | Freq: Two times a day (BID) | INTRAVENOUS | Status: DC
  Administered 2017-09-19: 250 mg via INTRAVENOUS
  Filled 2017-09-19 (×2): qty 2.5

## 2017-09-19 MED ORDER — ASPIRIN 300 MG RE SUPP
300.0000 mg | Freq: Every day | RECTAL | Status: DC
  Administered 2017-09-20 – 2017-09-27 (×7): 300 mg via RECTAL
  Filled 2017-09-19 (×7): qty 1

## 2017-09-19 MED ORDER — FUROSEMIDE 10 MG/ML IJ SOLN: 40.0000 mg | Freq: Four times a day (QID) | INTRAMUSCULAR | Status: DC

## 2017-09-19 MED ORDER — VANCOMYCIN HCL IN DEXTROSE 750-5 MG/150ML-% IV SOLN: 750.0000 mg | INTRAVENOUS | Status: DC

## 2017-09-19 MED ORDER — LEVOFLOXACIN IN D5W 500 MG/100ML IV SOLN
500.0000 mg | INTRAVENOUS | Status: DC
  Administered 2017-09-19: 500 mg via INTRAVENOUS
  Filled 2017-09-19: qty 100

## 2017-09-19 MED ORDER — LEVOFLOXACIN IN D5W 750 MG/150ML IV SOLN: 750.0000 mg | INTRAVENOUS | Status: DC

## 2017-09-19 MED ORDER — HEPARIN (PORCINE) IN NACL 100-0.45 UNIT/ML-% IJ SOLN
800.0000 [IU]/h | INTRAMUSCULAR | Status: DC
  Administered 2017-09-19: 1000 [IU]/h via INTRAVENOUS
  Administered 2017-09-21: 850 [IU]/h via INTRAVENOUS
  Administered 2017-09-22: 950 [IU]/h via INTRAVENOUS
  Administered 2017-09-24 – 2017-09-25 (×2): 900 [IU]/h via INTRAVENOUS
  Filled 2017-09-19 (×7): qty 250

## 2017-09-19 MED ORDER — KCL IN DEXTROSE-NACL 20-5-0.45 MEQ/L-%-% IV SOLN
INTRAVENOUS | Status: DC
  Administered 2017-09-19: 05:00:00 via INTRAVENOUS
  Filled 2017-09-19: qty 1000

## 2017-09-19 MED ORDER — IPRATROPIUM-ALBUTEROL 0.5-2.5 (3) MG/3ML IN SOLN
3.0000 mL | Freq: Four times a day (QID) | RESPIRATORY_TRACT | Status: DC
  Administered 2017-09-19: 3 mL via RESPIRATORY_TRACT
  Filled 2017-09-19 (×2): qty 3

## 2017-09-19 MED ORDER — VANCOMYCIN HCL IN DEXTROSE 1-5 GM/200ML-% IV SOLN
1000.0000 mg | Freq: Once | INTRAVENOUS | Status: AC
  Administered 2017-09-19: 1000 mg via INTRAVENOUS
  Filled 2017-09-19: qty 200

## 2017-09-19 MED ORDER — IPRATROPIUM-ALBUTEROL 0.5-2.5 (3) MG/3ML IN SOLN
3.0000 mL | Freq: Four times a day (QID) | RESPIRATORY_TRACT | Status: DC
  Administered 2017-09-19 – 2017-09-21 (×10): 3 mL via RESPIRATORY_TRACT
  Filled 2017-09-19 (×11): qty 3

## 2017-09-19 MED ORDER — ASPIRIN 300 MG RE SUPP
300.0000 mg | Freq: Every day | RECTAL | Status: DC
  Administered 2017-09-19: 300 mg via RECTAL
  Filled 2017-09-19: qty 1

## 2017-09-19 MED ORDER — HYDRALAZINE HCL 20 MG/ML IJ SOLN
10.0000 mg | Freq: Four times a day (QID) | INTRAMUSCULAR | Status: DC | PRN
  Filled 2017-09-19: qty 0.5

## 2017-09-19 NOTE — Progress Notes (Signed)
Patient arrived to [facility] via bed from [place]. Patient is alert to self at this time. Follow commands. No signs and symptoms of distress noted at this time. Cortrak in place.
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
## 2017-09-19 NOTE — Progress Notes (Signed)
Speech Language Pathology Daily Session Note  Patient Details  Name: NAZIRAH TRI MRN: [id number] Date of Birth: [birth year]  Today's Date: 09/19/2017 SLP Individual Time: 1000-1100 SLP Individual Time Calculation (min): 60 min  Short Term Goals: Week 1: SLP Short Term Goal 1 (Week 1): Given Max A cues, pt will maintain arousal for 1-2 minute intervals.  SLP Short Term Goal 2 (Week 1): Given Max A cues, pt will follow 1 step simple directions with ~ 50% accuracy.  SLP Short Term Goal 3 (Week 1): Given Max A cues, pt will imitate vowels and CV combinations with ~ 25% accuracy.  SLP Short Term Goal 4 (Week 1): Pt will point to requested object in field of 2 with Max A cues to achieve ~ 50% accuracy.  SLP Short Term Goal 5 (Week 1): Given Max A cues, pt will answer simple yes/no questions with~ 50% accuracy.  SLP Short Term Goal 6 (Week 1): Given oral stimulation and Max A cues, pt will demonstrate consistent swallow initiation in 3 of 5 opportunities.   Skilled Therapeutic Interventions: Skilled ST services focused on swallow and cognitive skills. SLP communicated with nurse and physician about pt's current medical condition and possible move from CIR due to cardiac impairments. SLP provided oral care, pt demonstrated limited ability to open oral cavity in order to provided proper cleaning. Pt demonstrated poor control of secretions and overall wet vocal quality. SLP provided oral stimulation and pt demonstrated max palpation to neck to produce swallow in 3 out 6 opportunities. Pt unable to produce voitional swallow given max multimodal cues without stimulation. SLP facilitated simple yes/no questions pertaining to immediate environment and biographical information with 70% accuracy given max verbal and visual cues, however increase in complexity of yes/no questions resulted in decrease in accuracy 30%. SLP facilitated receptive language skills, identifying common objects within a field of two with  70% accuracy given max verbal and visual cues and by function 80% accuracy with max verbal and visual cues. Pt demonstrated ability to follow 1 step directions during functional tasks with 60% accuracy given max verbal and visual cues. Pt was adjusted to 50 degrees in bed with call bell within reach. Recommend to continue skilled ST services.      Function:  Eating Eating Eating activity did not occur: Safety/medical concerns               Cognition Comprehension Comprehension assist level: Understands basic 50 - 74% of the time/ requires cueing 25 - 49% of the time  Expression   Expression assist level: Expresses basis less than 25% of the time/requires cueing >75% of the time.  Social Interaction Social Interaction assist level: Interacts appropriately 25 - 49% of time - Needs frequent redirection.  Problem Solving Problem solving assist level: Solves basic 25 - 49% of the time - needs direction more than half the time to initiate, plan or complete simple activities  Memory Memory assist level: Recognizes or recalls less than 25% of the time/requires cueing greater than 75% of the time    Pain Pain Assessment Pain Assessment: No/denies pain  Therapy/Group: Individual Therapy  Cleo Santucci  [facility] 09/19/2017, 12:31 PM
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
## 2017-09-19 NOTE — Plan of Care (Signed)
Tube feeding on hold at this time.

## 2017-09-19 NOTE — H&P (Deleted)
  The note originally documented on this encounter has been moved the the encounter in which it belongs.  

## 2017-09-19 NOTE — Progress Notes (Signed)
ANTICOAGULATION CONSULT NOTE - Follow Up Consult  Pharmacy Consult for heparin Indication: atrial fibrillation and stroke  Labs: Recent Labs    09/17/17 0207  09/18/17 0448  09/19/17 0138 09/19/17 0407  09/19/17 1051 09/19/17 1245 09/19/17 1418 09/19/17 1922  HGB 9.7*  --  9.5*  --  9.1*  --   --   --   --   --   --   HCT 30.3*  --  30.1*  --  28.4*  --   --   --   --   --   --   PLT 420*  --  451*  --  417*  --   --   --   --   --   --   HEPARINUNFRC 0.31   < > 0.19*   < > 0.29*  --   --  0.59  --   --  0.63  CREATININE 0.60  --   --   --   --  0.72  --   --   --   --   --   TROPONINI  --   --   --   --   --   --    < >  --  0.03* 0.04* 0.04*   < > = values in this interval not displayed.    Assessment: 83yo female on IV heparin for hx afib, new stroke 11/25 (PTA Eliquis- on hold, NPO). Heaprin drip 1050 uts/hr HL 0.6 > goal 0.3-0.5 - lower goal post CVA No signs of bleeding per RN.  Goal of Therapy:  Heparin level 0.3-0.5 units/ml   Plan:  Will decrease heparin gtt to 1000 units/hr Daily heparin level and CBC  Bonnita Nasuti Pharm.D. CPP, BCPS Clinical Pharmacist [phone] 09/19/2017 10:09 PM

## 2017-09-19 NOTE — Progress Notes (Signed)
Pharmacy Antibiotic Note  Kayla Kent is a 83 y.o. female admitted on 09/16/2017 for rehab s/p CVA, now w/ concern for pneumonia.  Pharmacy has been consulted for vancomycin dosing.  Plan: Vancomycin 1000mg  x1 then 750mg  IV every 24 hours.  Goal trough 15-20 mcg/mL.  Change Levaquin to 500mg  IV every 48 hours for renal function.  Height: 5\' 7"  (170.2 cm) Weight: 103 lb 9.9 oz (47 kg) IBW/kg (Calculated) : 61.6  Temp (24hrs), Avg:98.4 F (36.9 C), Min:97.7 F (36.5 C), Max:98.9 F (37.2 C)  Recent Labs  Lab 09/12/17 0549 09/13/17 0334 09/14/17 0429 09/15/17 0608 09/16/17 0430 09/17/17 0207 09/18/17 0448 09/19/17 0138  WBC 17.3* 10.8* 10.0 11.0* 10.4 15.7* 16.4* 14.6*  CREATININE 0.75 0.80 0.74  --  0.57 0.60  --   --     Estimated Creatinine Clearance: 39.5 mL/min (by C-G formula based on SCr of 0.6 mg/dL).    Allergies  Allergen Reactions  . Chocolate Hives  . Codeine Other (See Comments)    Patient states she acts crazy  . Diazepam Other (See Comments)    Patient states she acts crazy.  . Fruit & Vegetable Daily [Nutritional Supplements] Hives and Swelling    peaches  . Iohexol Hives     Code: HIVES, Desc: pt gets 13 hr pre-meds, Onset Date: [id number]   . Latex Hives  . Peach [Prunus Persica] Hives  . Peanut-Containing Drug Products Hives and Swelling  . Penicillins Hives, Itching and Swelling    Has patient had a PCN reaction causing immediate rash, facial/tongue/throat swelling, SOB or lightheadedness with hypotension: Yes Has patient had a PCN reaction causing severe rash involving mucus membranes or skin necrosis: No Has patient had a PCN reaction that required hospitalization No Has patient had a PCN reaction occurring within the last 10 years: No If all of the above answers are "NO", then may proceed with Cephalosporin use.   . Strawberry Extract Hives  . Sulfonamide Derivatives Hives    nausea  . Wheat Shortness Of Breath    Shortness of breath  .  Wheat Bran Shortness Of Breath  . Sulfamethoxazole Hives  . Crestor [Rosuvastatin Calcium] Rash  . Iodine Rash    Thank you for allowing pharmacy to be a part of this patient's care.  Wynona Neat, PharmD, BCPS  09/19/2017 3:29 AM

## 2017-09-19 NOTE — H&P (Signed)
History and Physical    Kayla Kent AST:[id number] DOB: 02/07/1932 DOA: 09/19/2017 Alphia Moh: [id number]   PCP: Biagio Borg, MD   Attending physician: Denton Brick  Patient coming from/Resides with: [facility]  Chief Complaint: Acute respiratory failure with hypoxemia presumed secondary to recurrent aspiration pneumonia  HPI: Kayla Kent is a 83 y.o. female with medical history significant for stage III chronic kidney disease, dyslipidemia, chronic atrial fibrillation previously on Eliquis, hypertension, seizure disorder on Keppra, and recent outpatient fine-needle aspiration of thyroid nodule.  Patient was initially admitted to the acute care side on 11/20 secondary to acute upper GI bleeding as well as abnormal urinalysis 2/2 multidrug-resistant Klebsiella pneumonia.  EGD completed on 11/23 revealed patchy mild inflammation throughout the stomach and she was treated with presumptive diagnosis of gastritis.  On 11/25 patient was aphasic with respiratory distress and was later found to have an acute MCA stroke.  She underwent mechanical thrombectomy and interventional radiology but because of worsening respiratory failure presumed related to aspiration pneumonia she was intubated briefly.  She was successfully extubated on 11/27.  She was found to have moderate to severe dysphagia and core tract tube was placed for medications and tube feedings.  Post stroke she has had persistent dense right hemiplegia.  It was documented on the date of discharge to CIR (11/30) that she was awake, following commands and participating in physical therapy and was an appropriate candidate for inpatient rehabilitation services.  According to the nursing staff, patient had been doing relatively well until the past 24 hours.  Patient was noted to have mild altered mentation but overnight increased work of breathing.  Nursing report that for several days patient has had difficulty maintaining oral secretions and has baseline  significant drooling.  Overnight she developed tan sputum when suctioned with color consistency similar to tube feedings.  Since that time she has developed frothy sputum.  On-call physician was notified by staff as well as rapid response and patient was placed on oxygen.  Tube feedings were stopped.  Patient did have a low-grade rectal temperature so broad-spectrum empiric antibiotics were initiated and due to concerns of a possible heart failure exacerbation Lasix was initiated as well.  Upon my initial evaluation of the patient she had marked postpharyngeal congestion and bilateral rhonchi on pulmonary exam.  Her neurologic exam was otherwise unchanged with dense right hemiplegia and aphasia although patient did attempt to speak but was unable to.  Family was not at bedside.  I have ordered multiple labs including an ABG.  She currently is a full code and due to concerns of possible further decompensation she will be discharged and readmitted to the stepdown unit at [place] Signs: BP (!) 107/47 (BP Location: Left Arm)   Pulse 93   Temp 98.5 F (36.9 C) (Oral)   Resp 20   Wt 46.5 kg (102 lb 8.2 oz)   SpO2 93%   BMI 16.06 kg/m   Review of Systems:  In addition to the HPI above,  **Unable to obtain from patient due to underlying neurological deficits-recent history obtained from chart and nursing staff as above   Past Medical History:  Diagnosis Date  . Allergic rhinitis   . Anxiety   . CKD (chronic kidney disease) stage 3, GFR 30-59 ml/min (HCC)   . Emphysema of lung ([place])   . HLD (hyperlipidemia)   . HTN (hypertension)   . Intolerance of drug    orthostatic  . Paroxysmal  supraventricular tachycardia ([place])   . PVD (peripheral vascular disease) ([place])   . Seizure ([place])   . Syncope and collapse   . Uterine prolapse without mention of vaginal wall prolapse     Past Surgical History:  Procedure Laterality Date  . CARDIOVERSION N/A 11/05/2014   Procedure:  CARDIOVERSION;  Surgeon: Candee Furbish, MD;  Location: [facility] ENDOSCOPY;  Service: Cardiovascular;  Laterality: N/A;  . cataract surgery  08/2015  . CHOLECYSTECTOMY    . corrective eye surgery     as a child  . ESOPHAGOGASTRODUODENOSCOPY N/A 09/09/2017   Procedure: ESOPHAGOGASTRODUODENOSCOPY (EGD);  Surgeon: Mauri Pole, MD;  Location: [facility] ENDOSCOPY;  Service: Endoscopy;  Laterality: N/A;  . INTRAMEDULLARY (IM) NAIL INTERTROCHANTERIC Left 11/21/2016   Procedure: INTRAMEDULLARY (IM) NAIL INTERTROCHANTRIC HEMI;  Surgeon: Newt Minion, MD;  Location: [place];  Service: Orthopedics;  Laterality: Left;  . IR PERCUTANEOUS ART THROMBECTOMY/INFUSION INTRACRANIAL INC DIAG ANGIO  09/11/2017  . IVD removed    . OPEN REDUCTION INTERNAL FIXATION (ORIF) DISTAL RADIAL FRACTURE Right 08/29/2014   Procedure: OPEN REDUCTION INTERNAL FIXATION (ORIF) DISTAL RADIAL FRACTURE;  Surgeon: Marianna Payment, MD;  Location: [place];  Service: Orthopedics;  Laterality: Right;  . RADIOLOGY WITH ANESTHESIA N/A 09/11/2017   Procedure: RADIOLOGY WITH ANESTHESIA;  Surgeon: Luanne Bras, MD;  Location: [place];  Service: Radiology;  Laterality: N/A;  . RF ablation PSVT     summer '10  . stress cardiolite  08/05/93  . TONSILLECTOMY    . TOTAL HIP ARTHROPLASTY Right 08/29/2014   Procedure: Right Hip Hemi Arthroplasty;  Surgeon: Marianna Payment, MD;  Location: [place];  Service: Orthopedics;  Laterality: Right;  Hip procedure 1st wants Peg Board, [employer], [place].     Social History   Socioeconomic History  . Marital status: Widowed    Spouse name: Not on file  . Number of children: 2  . Years of education: 78  . Highest education level: Not on file  Social Needs  . Financial resource strain: Not on file  . Food insecurity - worry: Not on file  . Food insecurity - inability: Not on file  . Transportation needs - medical: Not on file  . Transportation needs - non-medical: Not on file  Occupational History    . Occupation: retired Radio producer  Tobacco Use  . Smoking status: Former Smoker    Packs/day: 0.25    Years: 38.00    Pack years: 9.50    Start date: 02/15/1952    Last attempt to quit: 10/18/1993    Years since quitting: 23.9  . Smokeless tobacco: Never Used  Substance and Sexual Activity  . Alcohol use: No    Alcohol/week: 0.0 oz  . Drug use: No  . Sexual activity: No  Other Topics Concern  . Not on file  Social History Narrative   [employer], [employer]-BA, [employer]-early childhood. Married '55 -61 years.  1 son - 76; 1 daughter - 40; 3 grandchildren . Lives alone. ACP - discussed and provided packet on end of life care (Feb '13)   Patient is now widowed.   Patient is right-handed.   Patient drinks tea daily.      [facility] (04/26/17):   Originally from [facility]. Previously was a Pharmacist, hospital. No pets currently. No mold exposure. No bird exposure.     Mobility: Mobility performed with max assist and verbal cues-needs to continue inpatient physical therapy Work history: Not obtained   Allergies  Allergen Reactions  .  Chocolate Hives  . Codeine Other (See Comments)    Patient states she acts crazy  . Diazepam Other (See Comments)    Patient states she acts crazy.  . Fruit & Vegetable Daily [Nutritional Supplements] Hives and Swelling    peaches  . Iohexol Hives     Code: HIVES, Desc: pt gets 13 hr pre-meds, Onset Date: [id number]   . Latex Hives  . Peach [Prunus Persica] Hives  . Peanut-Containing Drug Products Hives and Swelling  . Penicillins Hives, Itching and Swelling    Has patient had a PCN reaction causing immediate rash, facial/tongue/throat swelling, SOB or lightheadedness with hypotension: Yes Has patient had a PCN reaction causing severe rash involving mucus membranes or skin necrosis: No Has patient had a PCN reaction that required hospitalization No Has patient had a PCN reaction occurring within the last 10 years: No If all of the above answers are  "NO", then may proceed with Cephalosporin use.   . Strawberry Extract Hives  . Sulfonamide Derivatives Hives    nausea  . Wheat Shortness Of Breath    Shortness of breath  . Wheat Bran Shortness Of Breath  . Sulfamethoxazole Hives  . Crestor [Rosuvastatin Calcium] Rash  . Iodine Rash    Family History  Problem Relation Age of Onset  . Mental illness Father        suicide  . Arthritis Father   . Hyperlipidemia Sister   . Hypertension Sister   . Heart disease Brother        CAD/MI  . Hypertension Brother   . Hypertension Unknown        family hx  . Colon cancer Neg Hx   . Breast cancer Neg Hx   . Diabetes Neg Hx   . Stroke Neg Hx   . Cancer Neg Hx   . Lung disease Neg Hx      Prior to Admission medications   Medication Sig Start Date End Date Taking? Authorizing Provider  acetaminophen (TYLENOL) 325 MG tablet Take 2 tablets (650 mg total) by mouth every 4 (four) hours as needed for mild pain (or temp > 37.5 C (99.5 F)). 09/16/17   Erick Colace, NP  aspirin 300 MG suppository Place 1 suppository (300 mg total) rectally daily. 09/17/17   Erick Colace, NP  atorvastatin (LIPITOR) 20 MG tablet Take 1 tablet (20 mg total) by mouth daily at 6 PM. 09/16/17   Erick Colace, NP  bisacodyl (DULCOLAX) 10 MG suppository Place 1 suppository (10 mg total) rectally daily as needed for moderate constipation. 09/16/17   Erick Colace, NP  chlorhexidine gluconate, MEDLINE KIT, (PERIDEX) 0.12 % solution 15 mLs by Mouth Rinse route 2 (two) times daily. 09/16/17   Erick Colace, NP  heparin 100-0.45 UNIT/ML-% infusion Inject 900 Units/hr into the vein continuous. 09/16/17   Erick Colace, NP  ipratropium-albuterol (DUONEB) 0.5-2.5 (3) MG/3ML SOLN Take 3 mLs by nebulization every 4 (four) hours as needed. 09/16/17   Erick Colace, NP  levETIRAcetam (KEPPRA) 100 MG/ML solution Take 2.5 mLs (250 mg total) by mouth 2 (two) times daily. 09/16/17   Erick Colace, NP   Nutritional Supplements (FEEDING SUPPLEMENT, OSMOLITE 1.2 CAL,) LIQD Place 1,000 mLs into feeding tube continuous. 09/16/17   Erick Colace, NP  pantoprazole sodium (PROTONIX) 40 mg/20 mL PACK Place 20 mLs (40 mg total) into feeding tube daily at 12 noon. 09/17/17   Erick Colace, NP  senna-docusate (SENOKOT-S) 8.6-50 MG  tablet 1 tablet by Per NG tube route at bedtime. 09/16/17   Erick Colace, NP  sodium chloride (OCEAN) 0.65 % SOLN nasal spray Place 1 spray into both nostrils as needed for congestion. 09/16/17   Erick Colace, NP  sodium chloride 0.9 % infusion Inject 250 mLs into the vein as needed (if IV carrier fluid needed.). 09/16/17   Erick Colace, NP    Physical Exam: Vitals:   09/19/17 1340  BP: (!) 107/47  Pulse: 93  Resp: 20  Temp: 98.5 F (36.9 C)  TempSrc: Oral  SpO2: 93%  Weight: 46.5 kg (102 lb 8.2 oz)      Constitutional: Mild respiratory distress as evidenced by ongoing posterior pharyngeal and pulmonary congestion requiring oxygen, calm, comfortable Eyes: PERRL, lids and conjunctivae normal ENMT: Mucous membranes are moist. Posterior pharynx clear of any exudate or lesions.Normal dentition.  Neck: normal, supple, no masses, no thyromegaly Respiratory: clear to auscultation bilaterally, no wheezing, no crackles. Normal respiratory effort without accessory muscle use at rest.  3 Liters Cardiovascular: Irregular rate, no murmurs / rubs / gallops. No extremity edema. 2+ pedal pulses. No carotid bruits.  Abdomen: no tenderness, no masses palpated. No hepatosplenomegaly. Bowel sounds positive.  C[place]rak tube in place per nare.  2 feedings currently off Musculoskeletal: no clubbing / cyanosis. No joint deformity upper and lower extremities. Good ROM, no contractures. Normal muscle tone.  Skin: no rashes, lesions, ulcers. No induration Neurologic: CN 2-12 grossly intact set for previously documented absence/minimal gag reflex in context of known severe  dysphagia. Sensation intact, DTR normal. Strength 4/5 left side with dense right hemiplegia.  Trace gross motor movement/strength 1/5 right lower extremity primarily movement of foot Psychiatric: Unable to determine secondary to patient's aphasia.  RN documents that in the past several days patient was able to get self on and off bedpan and follow commands appropriately.  She was also following commands for me lysing the left side.   Labs on Admission: I have personally reviewed following labs and imaging studies  CBC: Recent Labs  Lab 09/14/17 0429 09/15/17 0608 09/16/17 0430 09/17/17 0207 09/18/17 0448 09/19/17 0138  WBC 10.0 11.0* 10.4 15.7* 16.4* 14.6*  NEUTROABS 8.1* 9.1* 8.0* 12.9*  --  11.4*  HGB 9.8* 10.3* 9.6* 9.7* 9.5* 9.1*  HCT 29.9* 31.8* 30.0* 30.3* 30.1* 28.4*  MCV 93.1 93.3 94.0 94.4 95.6 95.0  PLT 329 391 417* 420* 451* 680*   Basic Metabolic Panel: Recent Labs  Lab 09/13/17 0334 09/14/17 0429 09/14/17 1622 09/15/17 0608 09/15/17 1626 09/16/17 0430 09/17/17 0207 09/19/17 0407  NA 140 142  --   --   --  142 140 137  K 3.7 3.4*  --   --   --  4.3 4.2 4.2  CL 108 105  --   --   --  110 109 103  CO2 25 25  --   --   --  _0 GLUCOSE 80 66  --   --   --  138* 118* 109*  BUN 29* 28*  --   --   --  27* 25* 19  CREATININE 0.80 0.74  --   --   --  0.57 0.60 0.72  CALCIUM 7.7* 7.9*  --   --   --  7.7* 7.6* 8.1*  MG  --   --  2.1 2.2 2.2 2.4 2.1  --   PHOS  --   --  2.6 2.6 2.2* 2.2* 2.2*  --  GFR: Estimated Creatinine Clearance: 39.1 mL/min (by C-G formula based on SCr of 0.72 mg/dL). Liver Function Tests: Recent Labs  Lab 09/17/17 0207  AST 59*  ALT 70*  ALKPHOS 92  BILITOT 0.4  PROT 4.9*  ALBUMIN 2.0*   No results for input(s): LIPASE, AMYLASE in the last 168 hours. No results for input(s): AMMONIA in the last 168 hours. Coagulation Profile: Recent Labs  Lab 09/13/17 0334  INR 1.17   Cardiac Enzymes: Recent Labs  Lab 09/19/17 0743   TROPONINI 0.04*   BNP (last 3 results) Recent Labs    10/22/16 1135  PROBNP 410.0*   HbA1C: No results for input(s): HGBA1C in the last 72 hours. CBG: Recent Labs  Lab 09/18/17 1949 09/18/17 2334 09/19/17 0403 09/19/17 0810 09/19/17 1144  GLUCAP 105* 124* 95 92 112*   Lipid Profile: No results for input(s): CHOL, HDL, LDLCALC, TRIG, CHOLHDL, LDLDIRECT in the last 72 hours. Thyroid Function Tests: No results for input(s): TSH, T4TOTAL, FREET4, T3FREE, THYROIDAB in the last 72 hours. Anemia Panel: No results for input(s): VITAMINB12, FOLATE, FERRITIN, TIBC, IRON, RETICCTPCT in the last 72 hours. Urine analysis:    Component Value Date/Time   COLORURINE COLORLESS (A) 09/11/2017 1146   APPEARANCEUR CLEAR 09/11/2017 1146   APPEARANCEUR Clear 07/22/2017 1022   LABSPEC 1.005 09/11/2017 1146   PHURINE 5.0 09/11/2017 1146   GLUCOSEU NEGATIVE 09/11/2017 1146   GLUCOSEU NEGATIVE 04/13/2017 0839   HGBUR SMALL (A) 09/11/2017 1146   BILIRUBINUR NEGATIVE 09/11/2017 1146   BILIRUBINUR Negative 07/22/2017 1022   KETONESUR NEGATIVE 09/11/2017 1146   PROTEINUR NEGATIVE 09/11/2017 1146   UROBILINOGEN 0.2 07/04/2017 0930   UROBILINOGEN 0.2 04/13/2017 0839   NITRITE NEGATIVE 09/11/2017 1146   LEUKOCYTESUR NEGATIVE 09/11/2017 1146   LEUKOCYTESUR Negative 07/22/2017 1022   Sepsis Labs: _0 (procalcitonin:4,lacticidven:4) ) Recent Results (from the past 240 hour(s))  Culture, blood (routine x 2)     Status: None   Collection Time: 09/11/17  8:32 AM  Result Value Ref Range Status   Specimen Description BLOOD RIGHT ANTECUBITAL  Final   Special Requests   Final    BOTTLES DRAWN AEROBIC AND ANAEROBIC Blood Culture adequate volume   Culture NO GROWTH 5 DAYS  Final   Report Status 09/16/2017 FINAL  Final  Culture, blood (routine x 2)     Status: None   Collection Time: 09/11/17  8:33 AM  Result Value Ref Range Status   Specimen Description BLOOD LEFT HAND  Final   Special  Requests   Final    BOTTLES DRAWN AEROBIC AND ANAEROBIC Blood Culture results may not be optimal due to an excessive volume of blood received in culture bottles   Culture NO GROWTH 5 DAYS  Final   Report Status 09/16/2017 FINAL  Final  Culture, Urine     Status: None   Collection Time: 09/11/17 11:00 AM  Result Value Ref Range Status   Specimen Description URINE, CLEAN CATCH  Final   Special Requests NONE  Final   Culture NO GROWTH  Final   Report Status 09/12/2017 FINAL  Final  Respiratory Panel by PCR     Status: None   Collection Time: 09/11/17  2:15 PM  Result Value Ref Range Status   Adenovirus NOT DETECTED NOT DETECTED Final   Coronavirus 229E NOT DETECTED NOT DETECTED Final   Coronavirus HKU1 NOT DETECTED NOT DETECTED Final   Coronavirus NL63 NOT DETECTED NOT DETECTED Final   Coronavirus OC43 NOT DETECTED NOT DETECTED Final  Metapneumovirus NOT DETECTED NOT DETECTED Final   Rhinovirus / Enterovirus NOT DETECTED NOT DETECTED Final   Influenza A NOT DETECTED NOT DETECTED Final   Influenza B NOT DETECTED NOT DETECTED Final   Parainfluenza Virus 1 NOT DETECTED NOT DETECTED Final   Parainfluenza Virus 2 NOT DETECTED NOT DETECTED Final   Parainfluenza Virus 3 NOT DETECTED NOT DETECTED Final   Parainfluenza Virus 4 NOT DETECTED NOT DETECTED Final   Respiratory Syncytial Virus NOT DETECTED NOT DETECTED Final   Bordetella pertussis NOT DETECTED NOT DETECTED Final   Chlamydophila pneumoniae NOT DETECTED NOT DETECTED Final   Mycoplasma pneumoniae NOT DETECTED NOT DETECTED Final  Culture, respiratory (NON-Expectorated)     Status: None   Collection Time: 09/11/17 11:44 PM  Result Value Ref Range Status   Specimen Description TRACHEAL ASPIRATE  Final   Special Requests NONE  Final   Gram Stain   Final    MODERATE WBC PRESENT, PREDOMINANTLY MONONUCLEAR NO SQUAMOUS EPITHELIAL CELLS SEEN NO ORGANISMS SEEN    Culture NO GROWTH 2 DAYS  Final   Report Status 09/14/2017 FINAL  Final      Radiological Exams on Admission: Dg Chest Port 1 View  Result Date: 09/19/2017 CLINICAL DATA:  Shortness of breath. EXAM: PORTABLE CHEST 1 VIEW COMPARISON:  09/17/2017 FINDINGS: Enteric catheter descends to the abdomen, collimated off the image. The cardiac silhouette is enlarged. Calcific atherosclerotic disease of the aorta. No evidence of pneumothorax. Bilateral small pleural effusions. Possible left lower lobe airspace consolidation. Ground-glass opacities first nodules in the right lower and right upper lobes. Hyperinflation of the lungs. Osseous structures are without acute abnormality. Soft tissues are grossly normal. IMPRESSION: Bilateral small pleural effusions with patchy left lower lobe airspace consolidation and pulmonary nodules versus areas of ground-glass airspace consolidation in the right upper and right lower lobes. Electronically Signed   By: Fidela Salisbury M.D.   On: 09/19/2017 01:44    EKG: (Independently reviewed) ordered  Assessment/Plan Principal Problem:   Acute respiratory failure with hypoxia 2/2 suspected recurrent aspiration pneumonia -Patient has developed acute respiratory failure in the past 24 hours in the context of presumed aspiration pneumonia post stroke with residual moderate to severe dysphagia; patient had similar episode as resenting symptom in context of initial stroke which required short-term mechanical ventilation -?  Inconsistent adherence to keeping head of bed elevated at least 30 degrees -Continue supportive care with oxygen and breathing treatments -Stop tube feedings for now; may do better with bolus tube feeds with patient sitting upright for 1-2 hours post feeding -Patient observed with significant posterior pharyngeal congestion and difficulty managing oral secretions; RN states patient has had significant drooling at baseline since admission to the rehabilitation unit -Other differential includes possible infectious/HCAP so we will  continue broad-spectrum empiric antibiotics (Vanc/Levaquin) -Check blood cultures, respiratory viral panel, influenza PCR, lactic acid, and lower respiratory tract Procalcitonin -Obtain sputum culture -Transfer to stepdown; not a candidate for BiPAP secondary to underlying aspiration concerns, nasal placement of feeding tube, and variable mental status -Check ABG -Patient does have leukocytosis with neutrophils 79% absolute neutrophils 11.4%  **Initial lactic acid 1.6 with repeat 1.3, procalcitonin 0.13 making definitive bacterial pneumonia such as HCAP less likely therefore can narrow antibiotics to cover aspiration pneumonitis only  Active Problems:   Chronic diastolic CHF (congestive heart failure)  -Chest x-ray c/w small bilateral pleural effusions and patchy left lower lobe airspace consolidation and areas of ground glass airspace consolidation in the right upper and right lower lobes-recent multifocal  pneumonia and acute care; upon my review of film patient has findings consistent with diffuse pneumonitis and COPD with current x-ray similar to x-ray from 12/1 and 11/30 -Current weight is 109 pounds (on 11/21 weight documented as 117 pounds) -She was given Lasix 40 mg IV x1 dose overnight without any significant increase in urine output -Last documented echo in the chart was completed in 2016 that showed preserved LV function with mild pulmonary hypertension 49 mmHg.  Was also found to have mild mitral regurgitation. -In review of discharge summary to CIR the intensivist documented patient underwent echocardiogram during previous admission with an EF of 55-60% with documented PFO but I am unable to locate that result in the medical record -Possibility acute heart failure as etiology to patient's respiratory symptoms so will obtain BNP and echocardiogram **BNP slightly elevated at 637 but patient appears clinically dry -Obtain EKG and trend troponin -Daily weights, strict I/O     Leukocytosis -As above -We will also check urinalysis and culture    COPD (chronic obstructive pulmonary disease)  -Not actively wheezing -Given acute respiratory failure continue scheduled duo nebs -Follow ET CO2 -Check ABG especially with altered mentation to ensure not hypercarbic **ABG consistent with metabolic alkalosis with a pH of 7.506, PCO2 34, PO2 125, ABE 3.8    Acute ischemic left MCA stroke /Dysphagia -Status post mechanical thrombectomy on 11/23 -Still with significant dense right hemiplegia and marked dysphagia as above -Continue PT/OT/SLP after readmission to acute side -IV fluid D5 1/2NS at 75/hr while TF -Given severity of dysphagia and documented diff w/ oral secretions it is NOT unexpected that she would have episodes of recurrent aspiration    Acute upper GI bleed -Secondary to mild gastritis-was admitting diagnosis on 11/20 -Continue PPI IV every 12 hours -Has not had recurrent bleeding on IV heparin    HTN (hypertension) -Current blood pressure suboptimal -Hold previous clonidine -Can utilize hydralazine IV prn for episodic hypertension    Chronic atrial fibrillation  -Prior to admission was on Eliquis; currently on IV heparin post stroke in context of severe dysphagia requiring NPO status -Follow-up on EKG as ordered -Currently rate controlled    CKD (chronic kidney disease), stage III  -Renal function stable and at baseline    Seizure disorder  -No reported seizure activity since admission -Continue Keppra but convert to IV route -Check Levetiracetram level    Thyroid nodule -Recent FNA of thyroid nodule with indeterminate pathology; testing ordered by PCP and patient can follow-up with this physician to discuss significance of findings    Dyslipidemia -Statin on hold until can reintroduce tube feedings      DVT prophylaxis: IV heparin Code Status: Full Family Communication: Discussed at length with patient's daughter and son Disposition  Plan: Discharge and readmit back to acute care/stepdown level Consults called: None    Temple Ewart L. ANP-BC [facility] Pager [phone]   If 7PM-7AM, please contact night-coverage [email or web address] Password [email or web address]  09/19/2017, 1:43 PM

## 2017-09-19 NOTE — Progress Notes (Signed)
Patient's son requesting for patient to be deep suction. Notifed [name] NP. New order given. Patient's son asking to start tube feeding. Paged Juanda Crumble NP. Will continue to hold tube feeding until am. Chest xray ordered for am. Attending to address after chest xray. This nurse and Mickle Plumb (receiving nurse) explained to son why tube feeding being held. Son states "you are not being an advocate for the patient." Patient's son states "I am upset".

## 2017-09-19 NOTE — Progress Notes (Addendum)
Kayla Kent is a 83 y.o. female May 01, 1932 [id number]  Vital signs in last 24 hours: Temp:  [98.7 F (37.1 C)-100.4 F (38 C)] 100.4 F (38 C) (12/03 0344) Pulse Rate:  [88-99] 95 (12/03 0405) Resp:  [17-30] 30 (12/03 0405) BP: (102-143)/(42-50) 102/42 (12/03 0037) SpO2:  [88 %-98 %] 96 % (12/03 0405) Weight:  [109 lb 12.6 oz (49.8 kg)] 109 lb 12.6 oz (49.8 kg) (12/03 0626) Weight change: 6 lb 2.8 oz (2.8 kg) Last BM Date: 09/16/17  Intake/Output from previous day: No intake/output data recorded. Last cbgs: CBG (last 3)  Recent Labs    09/18/17 1949 09/18/17 2334 09/19/17 0403  GLUCAP 105* 124* 95      Lab Results: BMET    Component Value Date/Time   NA 137 09/19/2017 0407   NA 144 03/18/2017 1200   K 4.2 09/19/2017 0407   CL 103 09/19/2017 0407   CO2 26 09/19/2017 0407   GLUCOSE 109 (H) 09/19/2017 0407   GLUCOSE 107 (H) 10/25/2006 0906   BUN 19 09/19/2017 0407   BUN 19.8 08/23/2017 1504   CREATININE 0.72 09/19/2017 0407   CREATININE 1.3 (H) 08/23/2017 1504   CALCIUM 8.1 (L) 09/19/2017 0407   GFRNONAA >60 09/19/2017 0407   GFRAA >60 09/19/2017 0407   CBC    Component Value Date/Time   WBC 14.6 (H) 09/19/2017 0138   RBC 2.99 (L) 09/19/2017 0138   HGB 9.1 (L) 09/19/2017 0138   HGB 14.5 03/18/2017 1200   HCT 28.4 (L) 09/19/2017 0138   HCT 43.8 03/18/2017 1200   PLT 417 (H) 09/19/2017 0138   PLT 224 03/18/2017 1200   MCV 95.0 09/19/2017 0138   MCV 90 03/18/2017 1200   MCH 30.4 09/19/2017 0138   MCHC 32.0 09/19/2017 0138   RDW 15.8 (H) 09/19/2017 0138   RDW 13.9 03/18/2017 1200   LYMPHSABS 1.8 09/19/2017 0138   MONOABS 1.2 (H) 09/19/2017 0138   EOSABS 0.2 09/19/2017 0138   BASOSABS 0.0 09/19/2017 0138    Studies/Results: Dg Chest Port 1 View  Result Date: 09/19/2017 CLINICAL DATA:  Shortness of breath. EXAM: PORTABLE CHEST 1 VIEW COMPARISON:  09/17/2017 FINDINGS: Enteric catheter descends to the abdomen, collimated off the image. The cardiac  silhouette is enlarged. Calcific atherosclerotic disease of the aorta. No evidence of pneumothorax. Bilateral small pleural effusions. Possible left lower lobe airspace consolidation. Ground-glass opacities first nodules in the right lower and right upper lobes. Hyperinflation of the lungs. Osseous structures are without acute abnormality. Soft tissues are grossly normal. IMPRESSION: Bilateral small pleural effusions with patchy left lower lobe airspace consolidation and pulmonary nodules versus areas of ground-glass airspace consolidation in the right upper and right lower lobes. Electronically Signed   By: Fidela Salisbury M.D.   On: 09/19/2017 01:44   Dg Chest Port 1 View  Result Date: 09/17/2017 CLINICAL DATA:  Respiratory failure EXAM: PORTABLE CHEST 1 VIEW COMPARISON:  09/16/2017 FINDINGS: There is hyperinflation of the lungs compatible with COPD. Cardiomegaly. Patchy bilateral airspace disease again noted, not significantly changed. Areas of consolidation most pronounced in the left base and right upper lobe. Small effusions. IMPRESSION: COPD.  Cardiomegaly. Patchy bilateral airspace disease and small effusions, unchanged. Findings concerning for multifocal pneumonia. Electronically Signed   By: Rolm Baptise M.D.   On: 09/17/2017 07:39      6:30 am on call:  The pt has deteriorated overnight due to probable repeat aspiration of her TF/stomach content. We have started the pt on HHN Duoneb,  IV Lasix and IV Vanc/Levaquin. TF was held. IVF at 100 cc/h. The pt is on O2. Will obtain a hospitalist consult [phone]). A consult request was placed. They are available for consults after 7 am.        Length of stay, days: 3  Walker Kehr , MD 09/19/2017, 6:33 AM

## 2017-09-19 NOTE — Progress Notes (Signed)
At approx 1145Pt was note to be having additional secretions as well as difficultly in managing her secretions.  Her breathing appeared to be somewhat labored and her overall  appearance looked weary. Attempted to use IS to encourage deep breathing and coughing.pt was only able to do achieve 250 on Is.  Staff asked if she felt like she was having to work harder to breathe and pt confirmed that she was. Called RT to provide PRN Duoned tx. Pt was sating in the mid 80's on Room Air RT provided PRN TX, pt REMAINED ON 2 L of O2 on call provider notified. Orders given TORB . Pt  After orders completed called DR again with results and updated  information on pt condition. Additional orders received and entered. Pt condition continued to wax and wane. RRT called  Dr updated on pt condition and Hospitalist consult put in per DR order. Will continue to monitor pt condition and notify providors of any changes, pending pt transfer to stepdown unit.

## 2017-09-19 NOTE — IPOC Note (Signed)
Overall Plan of Care [facility]) Patient Details Name: Kayla Kent MRN: [id number] DOB: 1932/08/27  Admitting Diagnosis: Acute respiratory failure with hypoxia [facility])  Hospital Problems: Principal Problem:   Acute respiratory failure with hypoxia ([place]) Active Problems:   Chronic diastolic CHF (congestive heart failure) (HCC)   Acute upper GI bleed   HTN (hypertension)   Seizure disorder (HCC)   Chronic atrial fibrillation (HCC)   Thyroid nodule   CKD (chronic kidney disease), stage III (HCC)   Dyslipidemia   COPD (chronic obstructive pulmonary disease) (HCC)   Acute ischemic left MCA stroke (HCC)   Dysphagia   History of GI bleed     Functional Problem List: Nursing Bladder, Bowel, Endurance, Medication Management, Nutrition, Perception, Safety, Skin Integrity, Pain  PT Balance, Behavior, Endurance, Motor, Nutrition, Pain, Perception, Safety, Sensory, Skin Integrity, Other (comment)  OT Balance, Cognition, Endurance, Motor, Nutrition, Perception, Safety, Sensory, Vision  SLP Cognition, Endurance, Linguistic, Motor, Sensory, Perception, Safety, Nutrition  TR         Basic ADL's: OT Grooming, Bathing, Dressing, Toileting     Advanced  ADL's: OT       Transfers: PT Bed Mobility, Car, Bed to Chair, [employer], Futures trader, Tub/Shower     Locomotion: PT Ambulation, Stairs, Wheelchair Mobility     Additional Impairments: OT Fuctional Use of Upper Extremity  SLP Swallowing, Communication, Social Cognition comprehension, expression Social Interaction, Problem Solving, Memory, Attention, Awareness  TR      Anticipated Outcomes Item Anticipated Outcome  Self Feeding    Swallowing  Min A with least restrictive diet   Basic self-care  Min A  Toileting  Min A   Bathroom Transfers Min A  Bowel/Bladder  Mod I for continent bowel and bladder episodes  Transfers  Min assist with LRAD   Locomotion  ambulatory Min assist for household distances with LRAD    Communication  Mod A with basic wants and needs  Cognition  Min A basic  Pain  <3 on a 0-10 pain scale  Safety/Judgment  min assist with appropriate assistive device   Therapy Plan: PT Intensity: Minimum of 1-2 x/day ,45 to 90 minutes PT Frequency: 5 out of 7 days PT Duration Estimated Length of Stay: 3-4 weeks  OT Intensity: Minimum of 1-2 x/day, 45 to 90 minutes OT Frequency: 5 out of 7 days OT Duration/Estimated Length of Stay: 23-28 days SLP Intensity: Minumum of 1-2 x/day, 30 to 90 minutes SLP Frequency: 3 to 5 out of 7 days SLP Duration/Estimated Length of Stay: 3 to 4 weeks    Team Interventions: Nursing Interventions Patient/Family Education, Bowel Management, Pain Management, Skin Care/Wound Management, Dysphagia/Aspiration Precaution Training, Psychosocial Support, Bladder Management, Disease Management/Prevention, Medication Management, Discharge Planning, Cognitive Remediation/Compensation  PT interventions Ambulation/gait training, Cognitive remediation/compensation, Training and development officer, Community reintegration, Discharge planning, Disease management/prevention, DME/adaptive equipment instruction, Functional electrical stimulation, Functional mobility training, Neuromuscular re-education, Pain management, Patient/family education, Psychosocial support, Skin care/wound management, Splinting/orthotics, Stair training, Therapeutic Activities, Therapeutic Exercise, Visual/perceptual remediation/compensation, UE/LE Coordination activities, UE/LE Strength taining/ROM, Wheelchair propulsion/positioning  OT Interventions Training and development officer, Cognitive remediation/compensation, Community reintegration, Discharge planning, DME/adaptive equipment instruction, Functional electrical stimulation, Functional mobility training, Neuromuscular re-education, Patient/family education, Self Care/advanced ADL retraining, Therapeutic Activities, Therapeutic Exercise,  Splinting/orthotics, UE/LE Strength taining/ROM, UE/LE Coordination activities, Visual/perceptual remediation/compensation, Wheelchair propulsion/positioning  SLP Interventions Cognitive remediation/compensation, Internal/external aids, Multimodal communication approach, Therapeutic Activities, Patient/family education, Functional tasks, Dysphagia/aspiration precaution training, Cueing hierarchy  TR Interventions    SW/CM Interventions Discharge Planning, Psychosocial Support, Patient/Family  Education   Barriers to Discharge MD  Medical stability, Home enviroment access/loayout, IV antibiotics, Lack of/limited family support and Nutritional means  Nursing Decreased caregiver support, Incontinence, Lack of/limited family support, Medication compliance, Nutrition means    PT Inaccessible home environment, Decreased caregiver support, Medical stability, Home environment access/layout, IV antibiotics, Incontinence    OT      SLP Nutrition means Cortrac placed now but pt has living will that specifies that Kayla Kent doesn't want a PEG to sustain nutrition for long term purposes  SW       Team Discharge Planning: Destination: PT-Skilled [place] (SNF) ,OT- Home(vs SNF) , SLP-Skilled Nursing Facility (SNF) Projected Follow-up: PT-Skilled nursing facility, OT-  Home health OT, Skilled nursing facility(HHOT vs. SNF pending progress), SLP-Skilled Nursing facility, 24 hour supervision/assistance Projected Equipment Needs: PT-Wheelchair cushion (measurements), Wheelchair (measurements), Rolling walker with 5" wheels, OT- To be determined, SLP-To be determined Equipment Details: PT- , OT-  Patient/family involved in discharge planning: PT- Patient,  OT-Patient, SLP-Family member/caregiver  MD ELOS: 24-28 days. Medical Rehab Prognosis:  Good Assessment: 83 y.o. female with medical history significant for stage III chronic kidney disease, dyslipidemia, chronic atrial fibrillation previously on Eliquis,  hypertension, seizure disorder on Keppra, and recent outpatient fine-needle aspiration of thyroid nodule.  Patient was initially admitted to the acute care side on 11/20 secondary to acute upper GI bleeding as well as abnormal urinalysis 2/2 multidrug-resistant Klebsiella pneumonia.  EGD completed on 11/23 revealed patchy mild inflammation throughout the stomach and Kayla Kent was treated with presumptive diagnosis of gastritis.  On 11/25 patient was aphasic with respiratory distress and was later found to have an acute MCA stroke.  Kayla Kent underwent mechanical thrombectomy and interventional radiology but because of worsening respiratory failure presumed related to aspiration pneumonia Kayla Kent was intubated briefly.  Kayla Kent was successfully extubated on 11/27.  Kayla Kent was found to have moderate to severe dysphagia and core tract tube was placed for medications and tube feedings.  Post stroke Kayla Kent has had persistent dense right hemiplegia.  It was documented on the date of discharge to CIR (11/30) that Kayla Kent was awake, following commands and participating in physical therapy and was an appropriate candidate for inpatient rehabilitation services. Patient had been doing relatively well until patient was noted to have mild altered mentation with increased work of breathing.  Nursing report that patient had needed difficulty maintaining oral secretions and has baseline significant drooling.  Kayla Kent developed tan sputum when suctioned with color consistency similar to tube feedings. Kayla Kent then developed frothy sputum.  On-call physician was notified by staff as well as rapid response and patient was placed on oxygen.  Tube feedings were stopped.  Patient did have a low-grade rectal temperature so broad-spectrum empiric antibiotics were initiated and due to concerns of a possible heart failure exacerbation Lasix was initiated as well.  Kayla Kent further deteriorated and suspected to have recurrent aspiration pneumonia, currently being workup up by  Hospitalist. Pt with resulting functional deficits with weakness, transfers, swallowing, cognition.  Will set goals for Min for most tasks with PT/OT/SLP.  See Team Conference Notes for weekly updates to the plan of care

## 2017-09-19 NOTE — Progress Notes (Signed)
[facility] PHYSICAL MEDICINE & REHABILITATION     PROGRESS NOTE  Subjective/Complaints:  Pt seen sitting up in bed this AM.  Several issues overnight discussed with nursing, particularly decline in respiratory status.  Hospitalist consulted.   ROS: Unable to assess due to cognition  Objective: Vital Signs: Blood pressure (!) 102/42, pulse 95, temperature (!) 100.4 F (38 C), temperature source Rectal, resp. rate (!) 30, height 5\' 7"  (1.702 m), weight 49.8 kg (109 lb 12.6 oz), SpO2 97 %. Dg Chest Port 1 View  Result Date: 09/19/2017 CLINICAL DATA:  Shortness of breath. EXAM: PORTABLE CHEST 1 VIEW COMPARISON:  09/17/2017 FINDINGS: Enteric catheter descends to the abdomen, collimated off the image. The cardiac silhouette is enlarged. Calcific atherosclerotic disease of the aorta. No evidence of pneumothorax. Bilateral small pleural effusions. Possible left lower lobe airspace consolidation. Ground-glass opacities first nodules in the right lower and right upper lobes. Hyperinflation of the lungs. Osseous structures are without acute abnormality. Soft tissues are grossly normal. IMPRESSION: Bilateral small pleural effusions with patchy left lower lobe airspace consolidation and pulmonary nodules versus areas of ground-glass airspace consolidation in the right upper and right lower lobes. Electronically Signed   By: Fidela Salisbury M.D.   On: 09/19/2017 01:44   Recent Labs    09/18/17 0448 09/19/17 0138  WBC 16.4* 14.6*  HGB 9.5* 9.1*  HCT 30.1* 28.4*  PLT 451* 417*   Recent Labs    09/17/17 0207 09/19/17 0407  NA 140 137  K 4.2 4.2  CL 109 103  GLUCOSE 118* 109*  BUN 25* 19  CREATININE 0.60 0.72  CALCIUM 7.6* 8.1*   CBG (last 3)  Recent Labs    09/18/17 2334 09/19/17 0403 09/19/17 0810  GLUCAP 124* 95 92    Wt Readings from Last 3 Encounters:  09/19/17 49.8 kg (109 lb 12.6 oz)  09/16/17 49 kg (108 lb 0.4 oz)  07/27/17 47.6 kg (105 lb)    Physical Exam:  BP (!)  102/42 (BP Location: Right Arm)   Pulse 95   Temp (!) 100.4 F (38 C) (Rectal)   Resp (!) 30   Ht 5\' 7"  (1.702 m)   Wt 49.8 kg (109 lb 12.6 oz)   SpO2 97%   BMI 17.20 kg/m  Constitutional: She appears well-developed. Frail. HENT: Normocephalic and atraumatic.  Eyes: EOMI. No discharge.  Neck: Normal range of motion. No JVD. Cardiovascular: Irregular rate and rhythm  Respiratory: Diffuse ronchi GI: Bowel sounds are normal. She exhibits no distension.  Neurological: She is alert.  Right facial weakness.  Extropia right eye (chronic) with right central 7  Severe dysarthria  Unable to follow commands Motor: Limited due to participation/comprehension.  Moving LUE freely Skin: Skin is warm and dry. She is not diaphoretic.  Psychiatric: Unable to assess due to cognition  Assessment/Plan: 1. Functional deficits secondary to left MCA infarct which require 3+ hours per day of interdisciplinary therapy in a comprehensive inpatient rehab setting. Physiatrist is providing close team supervision and 24 hour management of active medical problems listed below. Physiatrist and rehab team continue to assess barriers to discharge/monitor patient progress toward functional and medical goals.  Function:  Bathing Bathing position   Position: Sitting EOB(and in bed)  Bathing parts Body parts bathed by patient: Right arm, Chest, Abdomen Body parts bathed by helper: Left arm, Front perineal area, Buttocks, Right upper leg, Left upper leg, Right lower leg, Left lower leg, Back  Bathing assist Assist Level: Touching or steadying assistance(Pt >  75%)      Upper Body Dressing/Undressing Upper body dressing   What is the patient wearing?: Hospital gown                Upper body assist Assist Level: Touching or steadying assistance(Pt > 75%)      Lower Body Dressing/Undressing Lower body dressing   What is the patient wearing?: Non-skid slipper socks, Pants       Pants- Performed by  helper: Thread/unthread right pants leg, Thread/unthread left pants leg, Pull pants up/down   Non-skid slipper socks- Performed by helper: Don/doff right sock, Don/doff left sock                  Lower body assist Assist for lower body dressing: 2 Helpers      Toileting Toileting Toileting activity did not occur: No continent bowel/bladder event        Toileting assist     Transfers Chair/bed transfer     Chair/bed transfer assist level: Maximal assist (Pt 25 - 49%/lift and lower) Chair/bed transfer assistive device: Armrests     Locomotion Ambulation     Max distance: 5ft Assist level: Maximal assist (Pt 25 - 49%)   Wheelchair   Type: Manual Max wheelchair distance: 34ft Assist Level: Moderate assistance (Pt 50 - 74%)  Cognition Comprehension Comprehension assist level: Understands basic less than 25% of the time/ requires cueing >75% of the time  Expression Expression assist level: Expresses basis less than 25% of the time/requires cueing >75% of the time.  Social Interaction Social Interaction assist level: Interacts appropriately less than 25% of the time. May be withdrawn or combative.  Problem Solving Problem solving assist level: Solves basic less than 25% of the time - needs direction nearly all the time or does not effectively solve problems and may need a restraint for safety  Memory Memory assist level: Recognizes or recalls less than 25% of the time/requires cueing greater than 75% of the time    Medical Problem List and Plan:  1. Functional deficits and right hemiparesis secondary to left MCA infarct    Not able to participate in therapies at present   Notes reviewed, images reviewed 2. DVT Prophylaxis/Anticoagulation: Pharmaceutical: Heparin  3. Pain Management: N/A  4. Mood: LCSW to follow for evaluation when appropriate.  5. Neuropsych: This patient is not capable of making decisions on her own behalf.  6. Skin/Wound Care: routine pressure relief  measures  7. Fluids/Electrolytes/Nutrition: Monitor I/Os.   TFs on hold at present 8. Aspiration PNA/ESBL UTI: Has completed 7 day course of meropenum on 11/27 And 5 day course of Vancomycin on 11/25.    Likely repeat episode, Duoneb, Lasix, Vanc/Levaquin started.  Hospitalist consulted.  Appreciate recs.  Workup ongoing   Febrile overnight   With leukocytosis 9. Undiagnosed COPD: No meds at this time. Encourage pulmonary hygiene.  10. Recent GIB: Continue PPI. Neuro has talked with family about bleeding risks and they elected on anticoagulation.  11. Afib with RVR/ SVT: On IV heparin--continue to hold Eliquis in case PEG required.  12. Fluid overload with bilateral pleural effusions: Monitor weights daily. Encourage pulmonary hygiene.    Follow up CXR reviewed, showing effusion/infiltrates 13. Severe dysphagia: Keep NPO. Keep HOB > 30 degrees. Continue tube feeds. Signs of malnutrition   Phosphorous low on 12/1, adjust through TFs   Mag WNL on 12/1 4. ?CKD: Stable. Avoid nephrotoxic meds. Continue to monitor with serial checks.    Cr 0.72 on 12/3  Cont to monitor 15. Seizure disorder: Continue Keppra bid  16. Acute blood loss anemia: Monitor for signs of rebleeding.    Hb 9.1 on 12/3   Cont to monitor  LOS (Days) 3 A FACE TO FACE EVALUATION WAS PERFORMED  Kayla Kent 09/19/2017 9:42 AM

## 2017-09-19 NOTE — Progress Notes (Signed)
ANTICOAGULATION CONSULT NOTE - Follow Up Consult  Pharmacy Consult for heparin Indication: atrial fibrillation and stroke  Labs: Recent Labs    09/17/17 0207  09/18/17 0448  09/18/17 2233 09/19/17 0138 09/19/17 0407 09/19/17 0743 09/19/17 1051 09/19/17 1245  HGB 9.7*  --  9.5*  --   --  9.1*  --   --   --   --   HCT 30.3*  --  30.1*  --   --  28.4*  --   --   --   --   PLT 420*  --  451*  --   --  417*  --   --   --   --   HEPARINUNFRC 0.31   < > 0.19*   < > 0.39 0.29*  --   --  0.59  --   CREATININE 0.60  --   --   --   --   --  0.72  --   --   --   TROPONINI  --   --   --   --   --   --   --  0.04*  --  0.03*   < > = values in this interval not displayed.    Assessment: 83yo female on IV heparin for hx afib, new stroke 11/25 (PTA Eliquis- on hold, NPO).  8 hour heparin level = 0.59 on heparin rate 1100 units/hr. Slightly above goal of 0.3-0.5 units/ml. No signs of bleeding per RN.  Goal of Therapy:  Heparin level 0.3-0.5 units/ml   Plan:  Will decrease heparin gtt to 1050 units/hr and check level in 8hr. Daily heparin level and CBC  Nicole Cella, RPh Clinical Pharmacist [phone] or [phone] (330p-1030p) Main Rx [phone]     09/19/2017,2:55 PM

## 2017-09-19 NOTE — H&P (Signed)
History and Physical    Kayla Kent KZL:[id number] DOB: 1932-01-01 DOA: 09/16/2017   PCP: Kayla Borg, MD   Attending physician: Kayla Kent  Patient coming from/Resides with: [facility]  Chief Complaint: Acute respiratory failure with hypoxemia presumed secondary to recurrent aspiration pneumonia  HPI: Kayla Kent is a 83 y.o. female with medical history significant for stage III chronic kidney disease, dyslipidemia, chronic atrial fibrillation previously on Eliquis, hypertension, seizure disorder on Keppra, and recent outpatient fine-needle aspiration of thyroid nodule.  Patient was initially admitted to the acute care side on 11/20 secondary to acute upper GI bleeding as well as abnormal urinalysis 2/2 multidrug-resistant Klebsiella pneumonia.  EGD completed on 11/23 revealed patchy mild inflammation throughout the stomach and she was treated with presumptive diagnosis of gastritis.  On 11/25 patient was aphasic with respiratory distress and was later found to have an acute MCA stroke.  She underwent mechanical thrombectomy and interventional radiology but because of worsening respiratory failure presumed related to aspiration pneumonia she was intubated briefly.  She was successfully extubated on 11/27.  She was found to have moderate to severe dysphagia and core tract tube was placed for medications and tube feedings.  Post stroke she has had persistent dense right hemiplegia.  It was documented on the date of discharge to CIR (11/30) that she was awake, following commands and participating in physical therapy and was an appropriate candidate for inpatient rehabilitation services.  According to the nursing staff, patient had been doing relatively well until the past 24 hours.  Patient was noted to have mild altered mentation but overnight increased work of breathing.  Nursing report that for several days patient has had difficulty maintaining oral secretions and has baseline significant  drooling.  Overnight she developed tan sputum when suctioned with color consistency similar to tube feedings.  Since that time she has developed frothy sputum.  On-call physician was notified by staff as well as rapid response and patient was placed on oxygen.  Tube feedings were stopped.  Patient did have a low-grade rectal temperature so broad-spectrum empiric antibiotics were initiated and due to concerns of a possible heart failure exacerbation Lasix was initiated as well.  Upon my initial evaluation of the patient she had marked postpharyngeal congestion and bilateral rhonchi on pulmonary exam.  Her neurologic exam was otherwise unchanged with dense right hemiplegia and aphasia although patient did attempt to speak but was unable to.  Family was not at bedside.  I have ordered multiple labs including an ABG.  She currently is a full code and due to concerns of possible further decompensation she will be discharged and readmitted to the stepdown unit at [place] Signs: BP (!) 102/42 (BP Location: Right Arm)   Pulse 95   Temp (!) 100.4 F (38 C) (Rectal)   Resp (!) 30   Ht _0  (1.702 m)   Wt 49.8 kg (109 lb 12.6 oz)   SpO2 97%   BMI 17.20 kg/m   Review of Systems:  In addition to the HPI above,  **Unable to obtain from patient due to underlying neurological deficits-recent history obtained from chart and nursing staff as above   Past Medical History:  Diagnosis Date  . Allergic rhinitis   . Anxiety   . CKD (chronic kidney disease) stage 3, GFR 30-59 ml/min (HCC)   . Emphysema of lung ([place])   . HLD (hyperlipidemia)   . HTN (hypertension)   . Intolerance of drug  orthostatic  . Paroxysmal supraventricular tachycardia ([place])   . PVD (peripheral vascular disease) ([place])   . Seizure ([place])   . Syncope and collapse   . Uterine prolapse without mention of vaginal wall prolapse     Past Surgical History:  Procedure Laterality Date  . CARDIOVERSION N/A 11/05/2014     Procedure: CARDIOVERSION;  Surgeon: Candee Furbish, MD;  Location: [facility] ENDOSCOPY;  Service: Cardiovascular;  Laterality: N/A;  . cataract surgery  08/2015  . CHOLECYSTECTOMY    . corrective eye surgery     as a child  . ESOPHAGOGASTRODUODENOSCOPY N/A 09/09/2017   Procedure: ESOPHAGOGASTRODUODENOSCOPY (EGD);  Surgeon: Mauri Pole, MD;  Location: [facility] ENDOSCOPY;  Service: Endoscopy;  Laterality: N/A;  . INTRAMEDULLARY (IM) NAIL INTERTROCHANTERIC Left 11/21/2016   Procedure: INTRAMEDULLARY (IM) NAIL INTERTROCHANTRIC HEMI;  Surgeon: Newt Minion, MD;  Location: [place];  Service: Orthopedics;  Laterality: Left;  . IR PERCUTANEOUS ART THROMBECTOMY/INFUSION INTRACRANIAL INC DIAG ANGIO  09/11/2017  . IVD removed    . OPEN REDUCTION INTERNAL FIXATION (ORIF) DISTAL RADIAL FRACTURE Right 08/29/2014   Procedure: OPEN REDUCTION INTERNAL FIXATION (ORIF) DISTAL RADIAL FRACTURE;  Surgeon: Marianna Payment, MD;  Location: [place];  Service: Orthopedics;  Laterality: Right;  . RADIOLOGY WITH ANESTHESIA N/A 09/11/2017   Procedure: RADIOLOGY WITH ANESTHESIA;  Surgeon: Luanne Bras, MD;  Location: [place];  Service: Radiology;  Laterality: N/A;  . RF ablation PSVT     summer '10  . stress cardiolite  08/05/93  . TONSILLECTOMY    . TOTAL HIP ARTHROPLASTY Right 08/29/2014   Procedure: Right Hip Hemi Arthroplasty;  Surgeon: Marianna Payment, MD;  Location: [place];  Service: Orthopedics;  Laterality: Right;  Hip procedure 1st wants Peg Board, [employer], [place].     Social History   Socioeconomic History  . Marital status: Widowed    Spouse name: Not on file  . Number of children: 2  . Years of education: 55  . Highest education level: Not on file  Social Needs  . Financial resource strain: Not on file  . Food insecurity - worry: Not on file  . Food insecurity - inability: Not on file  . Transportation needs - medical: Not on file  . Transportation needs - non-medical: Not on file   Occupational History  . Occupation: retired Radio producer  Tobacco Use  . Smoking status: Former Smoker    Packs/day: 0.25    Years: 38.00    Pack years: 9.50    Start date: 02/15/1952    Last attempt to quit: 10/18/1993    Years since quitting: 23.9  . Smokeless tobacco: Never Used  Substance and Sexual Activity  . Alcohol use: No    Alcohol/week: 0.0 oz  . Drug use: No  . Sexual activity: No  Other Topics Concern  . Not on file  Social History Narrative   [employer], [employer]-BA, [employer]-early childhood. Married '55 -54 years.  1 son - 7; 1 daughter - 32; 3 grandchildren . Lives alone. ACP - discussed and provided packet on end of life care (Feb '13)   Patient is now widowed.   Patient is right-handed.   Patient drinks tea daily.      [facility] (04/26/17):   Originally from [facility] Previously was a Pharmacist, hospital. No pets currently. No mold exposure. No bird exposure.     Mobility: Mobility performed with max assist and verbal cues-needs to continue inpatient physical therapy Work history: Not obtained  Allergies  Allergen Reactions  . Chocolate Hives  . Codeine Other (See Comments)    Patient states she acts crazy  . Diazepam Other (See Comments)    Patient states she acts crazy.  . Fruit & Vegetable Daily [Nutritional Supplements] Hives and Swelling    peaches  . Iohexol Hives     Code: HIVES, Desc: pt gets 13 hr pre-meds, Onset Date: [id number]   . Latex Hives  . Peach [Prunus Persica] Hives  . Peanut-Containing Drug Products Hives and Swelling  . Penicillins Hives, Itching and Swelling    Has patient had a PCN reaction causing immediate rash, facial/tongue/throat swelling, SOB or lightheadedness with hypotension: Yes Has patient had a PCN reaction causing severe rash involving mucus membranes or skin necrosis: No Has patient had a PCN reaction that required hospitalization No Has patient had a PCN reaction occurring within the last 10 years: No If all of the  above answers are "NO", then may proceed with Cephalosporin use.   . Strawberry Extract Hives  . Sulfonamide Derivatives Hives    nausea  . Wheat Shortness Of Breath    Shortness of breath  . Wheat Bran Shortness Of Breath  . Sulfamethoxazole Hives  . Crestor [Rosuvastatin Calcium] Rash  . Iodine Rash    Family History  Problem Relation Age of Onset  . Mental illness Father        suicide  . Arthritis Father   . Hyperlipidemia Sister   . Hypertension Sister   . Heart disease Brother        CAD/MI  . Hypertension Brother   . Hypertension Unknown        family hx  . Colon cancer Neg Hx   . Breast cancer Neg Hx   . Diabetes Neg Hx   . Stroke Neg Hx   . Cancer Neg Hx   . Lung disease Neg Hx      Prior to Admission medications   Medication Sig Start Date End Date Taking? Authorizing Provider  acetaminophen (TYLENOL) 325 MG tablet Take 2 tablets (650 mg total) by mouth every 4 (four) hours as needed for mild pain (or temp > 37.5 C (99.5 F)). 09/16/17   Erick Colace, NP  aspirin 300 MG suppository Place 1 suppository (300 mg total) rectally daily. 09/17/17   Erick Colace, NP  atorvastatin (LIPITOR) 20 MG tablet Take 1 tablet (20 mg total) by mouth daily at 6 PM. 09/16/17   Erick Colace, NP  bisacodyl (DULCOLAX) 10 MG suppository Place 1 suppository (10 mg total) rectally daily as needed for moderate constipation. 09/16/17   Erick Colace, NP  chlorhexidine gluconate, MEDLINE KIT, (PERIDEX) 0.12 % solution 15 mLs by Mouth Rinse route 2 (two) times daily. 09/16/17   Erick Colace, NP  heparin 100-0.45 UNIT/ML-% infusion Inject 900 Units/hr into the vein continuous. 09/16/17   Erick Colace, NP  ipratropium-albuterol (DUONEB) 0.5-2.5 (3) MG/3ML SOLN Take 3 mLs by nebulization every 4 (four) hours as needed. 09/16/17   Erick Colace, NP  levETIRAcetam (KEPPRA) 100 MG/ML solution Take 2.5 mLs (250 mg total) by mouth 2 (two) times daily. 09/16/17   Erick Colace, NP  Nutritional Supplements (FEEDING SUPPLEMENT, OSMOLITE 1.2 CAL,) LIQD Place 1,000 mLs into feeding tube continuous. 09/16/17   Erick Colace, NP  pantoprazole sodium (PROTONIX) 40 mg/20 mL PACK Place 20 mLs (40 mg total) into feeding tube daily at 12 noon. 09/17/17   Erick Colace,  NP  senna-docusate (SENOKOT-S) 8.6-50 MG tablet 1 tablet by Per NG tube route at bedtime. 09/16/17   Erick Colace, NP  sodium chloride (OCEAN) 0.65 % SOLN nasal spray Place 1 spray into both nostrils as needed for congestion. 09/16/17   Erick Colace, NP  sodium chloride 0.9 % infusion Inject 250 mLs into the vein as needed (if IV carrier fluid needed.). 09/16/17   Erick Colace, NP    Physical Exam: Vitals:   09/19/17 0344 09/19/17 0405 09/19/17 0626 09/19/17 0756  BP:      Pulse:  95    Resp:  (!) 30    Temp: (!) 100.4 F (38 C)     TempSrc: Rectal     SpO2: 97% 96%  97%  Weight:   49.8 kg (109 lb 12.6 oz)   Height:          Constitutional: Mild respiratory distress as evidenced by ongoing posterior pharyngeal and pulmonary congestion requiring oxygen, calm, comfortable Eyes: PERRL, lids and conjunctivae normal ENMT: Mucous membranes are moist. Posterior pharynx clear of any exudate or lesions.Normal dentition.  Neck: normal, supple, no masses, no thyromegaly Respiratory: clear to auscultation bilaterally, no wheezing, no crackles. Normal respiratory effort without accessory muscle use at rest.  3 Liters Cardiovascular: Irregular rate, no murmurs / rubs / gallops. No extremity edema. 2+ pedal pulses. No carotid bruits.  Abdomen: no tenderness, no masses palpated. No hepatosplenomegaly. Bowel sounds positive.  Coretrak tube in place per nare.  2 feedings currently off Musculoskeletal: no clubbing / cyanosis. No joint deformity upper and lower extremities. Good ROM, no contractures. Normal muscle tone.  Skin: no rashes, lesions, ulcers. No induration Neurologic: CN 2-12 grossly intact  set for previously documented absence/minimal gag reflex in context of known severe dysphagia. Sensation intact, DTR normal. Strength 4/5 left side with dense right hemiplegia.  Trace gross motor movement/strength 1/5 right lower extremity primarily movement of foot Psychiatric: Unable to determine secondary to patient's aphasia.  RN documents that in the past several days patient was able to get self on and off bedpan and follow commands appropriately.  She was also following commands for me lysing the left side.   Labs on Admission: I have personally reviewed following labs and imaging studies  CBC: Recent Labs  Lab 09/14/17 0429 09/15/17 0608 09/16/17 0430 09/17/17 0207 09/18/17 0448 09/19/17 0138  WBC 10.0 11.0* 10.4 15.7* 16.4* 14.6*  NEUTROABS 8.1* 9.1* 8.0* 12.9*  --  11.4*  HGB 9.8* 10.3* 9.6* 9.7* 9.5* 9.1*  HCT 29.9* 31.8* 30.0* 30.3* 30.1* 28.4*  MCV 93.1 93.3 94.0 94.4 95.6 95.0  PLT 329 391 417* 420* 451* 811*   Basic Metabolic Panel: Recent Labs  Lab 09/13/17 0334 09/14/17 0429 09/14/17 1622 09/15/17 0608 09/15/17 1626 09/16/17 0430 09/17/17 0207 09/19/17 0407  NA 140 142  --   --   --  142 140 137  K 3.7 3.4*  --   --   --  4.3 4.2 4.2  CL 108 105  --   --   --  110 109 103  CO2 25 25  --   --   --  _0 GLUCOSE 80 66  --   --   --  138* 118* 109*  BUN 29* 28*  --   --   --  27* 25* 19  CREATININE 0.80 0.74  --   --   --  0.57 0.60 0.72  CALCIUM 7.7* 7.9*  --   --   --  7.7* 7.6* 8.1*  MG  --   --  2.1 2.2 2.2 2.4 2.1  --   PHOS  --   --  2.6 2.6 2.2* 2.2* 2.2*  --    GFR: Estimated Creatinine Clearance: 41.9 mL/min (by C-G formula based on SCr of 0.72 mg/dL). Liver Function Tests: Recent Labs  Lab 09/17/17 0207  AST 59*  ALT 70*  ALKPHOS 92  BILITOT 0.4  PROT 4.9*  ALBUMIN 2.0*   No results for input(s): LIPASE, AMYLASE in the last 168 hours. No results for input(s): AMMONIA in the last 168 hours. Coagulation Profile: Recent Labs  Lab  09/13/17 0334  INR 1.17   Cardiac Enzymes: No results for input(s): CKTOTAL, CKMB, CKMBINDEX, TROPONINI in the last 168 hours. BNP (last 3 results) Recent Labs    10/22/16 1135  PROBNP 410.0*   HbA1C: No results for input(s): HGBA1C in the last 72 hours. CBG: Recent Labs  Lab 09/18/17 1202 09/18/17 1628 09/18/17 1949 09/18/17 2334 09/19/17 0403  GLUCAP 98 87 105* 124* 95   Lipid Profile: No results for input(s): CHOL, HDL, LDLCALC, TRIG, CHOLHDL, LDLDIRECT in the last 72 hours. Thyroid Function Tests: No results for input(s): TSH, T4TOTAL, FREET4, T3FREE, THYROIDAB in the last 72 hours. Anemia Panel: No results for input(s): VITAMINB12, FOLATE, FERRITIN, TIBC, IRON, RETICCTPCT in the last 72 hours. Urine analysis:    Component Value Date/Time   COLORURINE COLORLESS (A) 09/11/2017 1146   APPEARANCEUR CLEAR 09/11/2017 1146   APPEARANCEUR Clear 07/22/2017 1022   LABSPEC 1.005 09/11/2017 1146   PHURINE 5.0 09/11/2017 1146   GLUCOSEU NEGATIVE 09/11/2017 1146   GLUCOSEU NEGATIVE 04/13/2017 0839   HGBUR SMALL (A) 09/11/2017 1146   BILIRUBINUR NEGATIVE 09/11/2017 1146   BILIRUBINUR Negative 07/22/2017 1022   KETONESUR NEGATIVE 09/11/2017 1146   PROTEINUR NEGATIVE 09/11/2017 1146   UROBILINOGEN 0.2 07/04/2017 0930   UROBILINOGEN 0.2 04/13/2017 0839   NITRITE NEGATIVE 09/11/2017 1146   LEUKOCYTESUR NEGATIVE 09/11/2017 1146   LEUKOCYTESUR Negative 07/22/2017 1022   Sepsis Labs: _0 (procalcitonin:4,lacticidven:4) ) Recent Results (from the past 240 hour(s))  Culture, blood (routine x 2)     Status: None   Collection Time: 09/11/17  8:32 AM  Result Value Ref Range Status   Specimen Description BLOOD RIGHT ANTECUBITAL  Final   Special Requests   Final    BOTTLES DRAWN AEROBIC AND ANAEROBIC Blood Culture adequate volume   Culture NO GROWTH 5 DAYS  Final   Report Status 09/16/2017 FINAL  Final  Culture, blood (routine x 2)     Status: None   Collection Time:  09/11/17  8:33 AM  Result Value Ref Range Status   Specimen Description BLOOD LEFT HAND  Final   Special Requests   Final    BOTTLES DRAWN AEROBIC AND ANAEROBIC Blood Culture results may not be optimal due to an excessive volume of blood received in culture bottles   Culture NO GROWTH 5 DAYS  Final   Report Status 09/16/2017 FINAL  Final  Culture, Urine     Status: None   Collection Time: 09/11/17 11:00 AM  Result Value Ref Range Status   Specimen Description URINE, CLEAN CATCH  Final   Special Requests NONE  Final   Culture NO GROWTH  Final   Report Status 09/12/2017 FINAL  Final  Respiratory Panel by PCR     Status: None   Collection Time: 09/11/17  2:15 PM  Result Value Ref Range Status   Adenovirus NOT DETECTED NOT DETECTED  Final   Coronavirus 229E NOT DETECTED NOT DETECTED Final   Coronavirus HKU1 NOT DETECTED NOT DETECTED Final   Coronavirus NL63 NOT DETECTED NOT DETECTED Final   Coronavirus OC43 NOT DETECTED NOT DETECTED Final   Metapneumovirus NOT DETECTED NOT DETECTED Final   Rhinovirus / Enterovirus NOT DETECTED NOT DETECTED Final   Influenza A NOT DETECTED NOT DETECTED Final   Influenza B NOT DETECTED NOT DETECTED Final   Parainfluenza Virus 1 NOT DETECTED NOT DETECTED Final   Parainfluenza Virus 2 NOT DETECTED NOT DETECTED Final   Parainfluenza Virus 3 NOT DETECTED NOT DETECTED Final   Parainfluenza Virus 4 NOT DETECTED NOT DETECTED Final   Respiratory Syncytial Virus NOT DETECTED NOT DETECTED Final   Bordetella pertussis NOT DETECTED NOT DETECTED Final   Chlamydophila pneumoniae NOT DETECTED NOT DETECTED Final   Mycoplasma pneumoniae NOT DETECTED NOT DETECTED Final  Culture, respiratory (NON-Expectorated)     Status: None   Collection Time: 09/11/17 11:44 PM  Result Value Ref Range Status   Specimen Description TRACHEAL ASPIRATE  Final   Special Requests NONE  Final   Gram Stain   Final    MODERATE WBC PRESENT, PREDOMINANTLY MONONUCLEAR NO SQUAMOUS EPITHELIAL  CELLS SEEN NO ORGANISMS SEEN    Culture NO GROWTH 2 DAYS  Final   Report Status 09/14/2017 FINAL  Final     Radiological Exams on Admission: Dg Chest Port 1 View  Result Date: 09/19/2017 CLINICAL DATA:  Shortness of breath. EXAM: PORTABLE CHEST 1 VIEW COMPARISON:  09/17/2017 FINDINGS: Enteric catheter descends to the abdomen, collimated off the image. The cardiac silhouette is enlarged. Calcific atherosclerotic disease of the aorta. No evidence of pneumothorax. Bilateral small pleural effusions. Possible left lower lobe airspace consolidation. Ground-glass opacities first nodules in the right lower and right upper lobes. Hyperinflation of the lungs. Osseous structures are without acute abnormality. Soft tissues are grossly normal. IMPRESSION: Bilateral small pleural effusions with patchy left lower lobe airspace consolidation and pulmonary nodules versus areas of ground-glass airspace consolidation in the right upper and right lower lobes. Electronically Signed   By: Fidela Salisbury M.D.   On: 09/19/2017 01:44    EKG: (Independently reviewed) ordered  Assessment/Plan Principal Problem:   Acute respiratory failure with hypoxia 2/2 suspected recurrent aspiration pneumonia -Patient has developed acute respiratory failure in the past 24 hours in the context of presumed aspiration pneumonia post stroke with residual moderate to severe dysphagia; patient had similar episode as resenting symptom in context of initial stroke which required short-term mechanical ventilation -?  Inconsistent adherence to keeping head of bed elevated at least 30 degrees -Continue supportive care with oxygen and breathing treatments -Stop tube feedings for now; may do better with bolus tube feeds with patient sitting upright for 1-2 hours post feeding -Patient observed with significant posterior pharyngeal congestion and difficulty managing oral secretions; RN states patient has had significant drooling at baseline  since admission to the rehabilitation unit -Other differential includes possible infectious/HCAP so we will continue broad-spectrum empiric antibiotics (Vanc/Levaquin) -Check blood cultures, respiratory viral panel, influenza PCR, lactic acid, and lower respiratory tract Procalcitonin -Obtain sputum culture -Transfer to stepdown; not a candidate for BiPAP secondary to underlying aspiration concerns, nasal placement of feeding tube, and variable mental status -Check ABG -Patient does have leukocytosis with neutrophils 79% absolute neutrophils 11.4%  **Initial lactic acid 1.6 with repeat 1.3, procalcitonin 0.13 making definitive bacterial pneumonia such as HCAP less likely therefore can narrow antibiotics to cover aspiration pneumonitis only  Active Problems:  Chronic diastolic CHF (congestive heart failure)  -Chest x-ray c/w small bilateral pleural effusions and patchy left lower lobe airspace consolidation and areas of ground glass airspace consolidation in the right upper and right lower lobes-recent multifocal pneumonia and acute care; upon my review of film patient has findings consistent with diffuse pneumonitis and COPD with current x-ray similar to x-ray from 12/1 and 11/30 -Current weight is 109 pounds (on 11/21 weight documented as 117 pounds) -She was given Lasix 40 mg IV x1 dose overnight without any significant increase in urine output -Last documented echo in the chart was completed in 2016 that showed preserved LV function with mild pulmonary hypertension 49 mmHg.  Was also found to have mild mitral regurgitation. -In review of discharge summary to CIR the intensivist documented patient underwent echocardiogram during previous admission with an EF of 55-60% with documented PFO but I am unable to locate that result in the medical record -Possibility acute heart failure as etiology to patient's respiratory symptoms so will obtain BNP and echocardiogram **BNP slightly elevated at 637 but  patient appears clinically dry -Obtain EKG and trend troponin -Daily weights, strict I/O    Leukocytosis -As above -We will also check urinalysis and culture    COPD (chronic obstructive pulmonary disease)  -Not actively wheezing -Given acute respiratory failure continue scheduled duo nebs -Follow ET CO2 -Check ABG especially with altered mentation to ensure not hypercarbic **ABG consistent with metabolic alkalosis with a pH of 7.506, PCO2 234, PO2 125, ABE 3.8    Acute ischemic left MCA stroke /Dysphagia -Status post mechanical thrombectomy on 11/23 -Still with significant dense right hemiplegia and marked dysphagia as above -Continue PT/OT/SLP after readmission to acute side -IV fluid D5 1/2NS at 75/hr while TF -Given severity of dysphagia and documented diff w/ oral secretions it is NOT unexpected that she would have episodes of recurrent aspiration    Acute upper GI bleed -Secondary to mild gastritis-was admitting diagnosis on 11/20 -Continue PPI IV every 12 hours -Has not had recurrent bleeding on IV heparin    HTN (hypertension) -Current blood pressure suboptimal -Hold previous clonidine -Can utilize hydralazine IV prn for episodic hypertension    Chronic atrial fibrillation  -Prior to admission was on Eliquis; currently on IV heparin post stroke in context of severe dysphagia requiring NPO status -Follow-up on EKG as ordered -Currently rate controlled    CKD (chronic kidney disease), stage III  -Renal function stable and at baseline    Seizure disorder  -No reported seizure activity since admission -Continue Keppra but convert to IV route -Check Levetiracetram level    Thyroid nodule -Recent FNA of thyroid nodule with indeterminate pathology; testing ordered by PCP and patient can follow-up with this physician to discuss significance of findings    Dyslipidemia -Statin on hold until can reintroduce tube feedings      DVT prophylaxis: IV heparin Code  Status: Full Family Communication: Discussed at length with patient's daughter and son Disposition Plan: Discharge and readmit back to acute care/stepdown level Consults called: None    Kissie Ziolkowski L. ANP-BC [facility] Pager [phone]   If 7PM-7AM, please contact night-coverage [email or web address] Password [email or web address]  09/19/2017, 8:22 AM

## 2017-09-19 NOTE — Discharge Summary (Signed)
Physician Discharge Summary  Patient ID: Kayla Kent MRN: [id number] DOB/AGE: [birth year] 83 y.o.  Admit date: 09/16/2017 Discharge date: 09/19/2017  Discharge Diagnoses:  Principal Problem:   Acute respiratory failure with hypoxia ([place]) Active Problems:   Chronic diastolic CHF (congestive heart failure) (HCC)   Acute upper GI bleed   HTN (hypertension)   Seizure disorder (HCC)   Chronic atrial fibrillation (HCC)   Thyroid nodule   CKD (chronic kidney disease), stage III (HCC)   Dyslipidemia   COPD (chronic obstructive pulmonary disease) (HCC)   Acute ischemic left MCA stroke ([place])   Dysphagia   History of GI bleed   Discharged Condition: guarded.   Significant Diagnostic Studies: Dg Chest 2 View  Result Date: 09/06/2017 CLINICAL DATA:  Dizziness.  Vomiting.  Third mental status . EXAM: CHEST  2 VIEW COMPARISON:  CT 08/23/2017. PET-CT 05/25/2017. Chest x-ray 04/14/2017. FINDINGS: Cardiomegaly with increase interstitial prominence bilaterally, particular on the left. Small bilateral pleural effusions cannot be excluded. Findings suggest CHF. Bilateral pneumonia cannot be excluded. Reference is made to prior PET-CT report of 05/25/2017. IMPRESSION: Findings consistent with congestive heart failure with bilateral from interstitial edema. Small bilateral pleural effusions cannot be excluded. Electronically Signed   By: Marcello Moores  Register   On: 09/06/2017 13:36   Ct Head Wo Contrast  Result Date: 09/11/2017 CLINICAL DATA:  Status post stroke intervention. EXAM: CT HEAD WITHOUT CONTRAST TECHNIQUE: Contiguous axial images were obtained from the base of the skull through the vertex without intravenous contrast. COMPARISON:  Head CT 09/11/2017 Cervical angiogram 09/11/2017 FINDINGS: Brain: There is a small amount of petechial hemorrhage versus contrast staining of the medial aspect of the left frontal operculum, coronal image 38. There is no intraparenchymal hematoma. No mass effect. No  extra-axial collection. Anterior right temporal lobe encephalomalacia is unchanged. There is periventricular hypoattenuation compatible with chronic microvascular disease. No hydrocephalus. Vascular: Intravascular hyperattenuation is likely persisting contrast material. Skull: Normal visualized skull base, calvarium and extracranial soft tissues. Sinuses/Orbits: No sinus fluid levels or advanced mucosal thickening. No mastoid effusion. Normal orbits. IMPRESSION: 1. Small focus of contrast staining versus petechial hemorrhage at the medial aspect of the left frontal operculum. 2. No intraparenchymal hematoma, mass effect or hydrocephalus. Electronically Signed   By: Ulyses Jarred M.D.   On: 09/11/2017 23:42   Ct Head Wo Contrast  Result Date: 09/06/2017 CLINICAL DATA:  Altered mental status with questionable fall. Slurred speech. EXAM: CT HEAD WITHOUT CONTRAST TECHNIQUE: Contiguous axial images were obtained from the base of the skull through the vertex without intravenous contrast. COMPARISON:  April 06, 2017 head CT; brain MRI September 23, 2014 FINDINGS: Brain: There is mild diffuse atrophy. There is no intracranial mass, hemorrhage, extra-axial fluid collection, or midline shift. There is chronic right temporal lobe encephalomalacia, stable. There is a prior infarct involving the head of the caudate nucleus on the right. There is patchy small vessel disease in the centra semiovale bilaterally, stable. No acute infarct demonstrable on this study. Vascular: No hyperdense vessel. There is calcification in each carotid siphon and distal vertebral artery. Skull: The bony calvarium appears intact. Small bony enostosis arising from the left frontal bone. There is evidence of old trauma with remodeling involving the left mandibular condyle with osteoarthritic change in this area. Sinuses/Orbits: There is mucosal thickening in several ethmoid air cells. Other visualized paranasal sinuses are clear. Orbits appear  symmetric bilaterally except for evidence of cataract removal on the left. Other: There is chronic opacification of several inferior mastoid  air cells. Most mastoid air cells bilaterally are clear. IMPRESSION: 1. Atrophy with periventricular small vessel disease, stable. Chronic encephalomalacia right temporal lobe, stable. 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 No new gray-white compartment lesion. No acute infarct. 2. No appreciable mass, hemorrhage, or extra-axial fluid collection. 3.  Foci of arterial vascular calcification evident. 4. Areas of paranasal sinus disease. Mild opacification of several inferior mastoid air cells bilaterally, stable. Electronically Signed   By: Lowella Grip III M.D.   On: 09/06/2017 12:01   Ct Chest Wo Contrast  Result Date: 09/11/2017 CLINICAL DATA:  83 year old female with history of shortness of breath. EXAM: CT CHEST WITHOUT CONTRAST TECHNIQUE: Multidetector CT imaging of the chest was performed following the standard protocol without IV contrast. COMPARISON:  Chest CT 08/23/2017. FINDINGS: Cardiovascular: Heart size is mildly enlarged. There is no significant pericardial fluid, thickening or pericardial calcification. There is aortic atherosclerosis, as well as atherosclerosis of the great vessels of the mediastinum and the coronary arteries, including calcified atherosclerotic plaque in the left main, left anterior descending, left circumflex and right coronary arteries. Mediastinum/Nodes: No pathologically enlarged mediastinal or hilar lymph nodes. Please note that accurate exclusion of hilar adenopathy is limited on noncontrast CT scans. Esophagus is unremarkable in appearance. No axillary lymphadenopathy. Lungs/Pleura: Multifocal asymmetrically distributed airspace consolidation throughout the lungs bilaterally, concerning for multilobar pneumonia. Previously noted right upper lobe pulmonary nodule is now largely obscured by adjacent airspace disease  and is not readily identifiable. Moderate bilateral pleural effusions. Near complete atelectasis of the left lower lobe with some additional dependent atelectasis in the right lower lobe. Upper Abdomen: Aortic atherosclerosis. Musculoskeletal: There are no aggressive appearing lytic or blastic lesions noted in the visualized portions of the skeleton. IMPRESSION: 1. Findings are most compatible with severe multilobar bronchopneumonia. 2. Moderate bilateral pleural effusions with areas of dependent atelectasis in the lower lobes of lungs bilaterally. 3. Cardiomegaly. 4. Aortic atherosclerosis, in addition to left main and 3 vessel coronary artery disease. 5. Previously treated right upper lobe pulmonary nodule is now obscured by overlying airspace disease. Attention on followup studies is recommended. Aortic Atherosclerosis (ICD10-I70.0). Electronically Signed   By: Vinnie Langton M.D.   On: 09/11/2017 11:06   Ct Chest Wo Contrast  Result Date: 08/24/2017 CLINICAL DATA:  Right upper lobe lung cancer. Follow-up of radiation therapy. Non-small cell type. EXAM: CT CHEST WITHOUT CONTRAST TECHNIQUE: Multidetector CT imaging of the chest was performed following the standard protocol without IV contrast. COMPARISON:  05/25/2017 PET.  04/15/2017 chest CT. FINDINGS: Cardiovascular: Advanced aortic and branch vessel atherosclerosis. Tortuous thoracic aorta. Moderate cardiomegaly, without pericardial effusion. Multivessel coronary artery atherosclerosis. Mediastinum/Nodes: No mediastinal or definite hilar adenopathy, given limitations of unenhanced CT. Interval development of esophageal dilatation, primarily fluid filled. Example image 39/ series 2. Lungs/Pleura: Resolution of bilateral pleural effusions since the prior diagnostic CT. Mild centrilobular emphysema. Spiculated right upper lobe pulmonary nodule measures 1.3 x 0.9 cm on image 42/series 7 versus 1.6 x 1.2 cm on the prior exam. A satellite right upper lobe 4 mm  solid nodule on image 51/series 7 is unchanged compared to image 58/series 8 of the prior CT. Minimal posterior right upper lobe opacity is favored to represent scarring or atelectasis. Left base scarring. Superior segment left lower lobe relatively linear opacity, including on image 75/ series 7, is favored to represent scarring. Minimal right lower lobe nodularity, including on coronal image 45 is not readily apparent on the prior exam, possibly due to atelectasis on  that study. Upper Abdomen: Cholecystectomy. Normal imaged portions of the liver, spleen, stomach, pancreas, adrenal glands. Mild renal cortical thinning bilaterally. Musculoskeletal: Osteopenia.  Convex right thoracic spine curvature. IMPRESSION: 1. Response to therapy of spiculated right upper lobe pulmonary nodule. An adjacent right upper lobe satellite nodule is not significantly changed. 2. No evidence of metastatic disease in the chest. 3.  Emphysema (ICD10-J43.9). 4. Coronary artery atherosclerosis. Aortic Atherosclerosis (ICD10-I70.0). 5. Development of dilated, fluid-filled esophagus. This is likely related to dysmotility and would predispose the patient to aspiration. Consider esophagram. 6. Nonspecific tiny pulmonary nodules. Electronically Signed   By: Abigail Miyamoto M.D.   On: 08/24/2017 09:13   Mr Brain Wo Contrast  Result Date: 09/13/2017 CLINICAL DATA:  Stroke follow-up.  Aphasia and right-sided weakness. EXAM: MRI HEAD WITHOUT CONTRAST TECHNIQUE: Multiplanar, multiecho pulse sequences of the brain and surrounding structures were obtained without intravenous contrast. COMPARISON:  Head CT 09/11/2017 FINDINGS: Brain: The midline structures are normal. There is diffusion restriction predominantly within the left precentral gyrus but also extending into the postcentral gyrus. Encephalomalacia is again noted at the right temporal pole. There is cytotoxic edema within the left frontal operculum. Scattered areas of white matter  hyperintensity consistent with chronic microvascular disease. No hemorrhage. No age-advanced or lobar predominant atrophy. No chronic microhemorrhage or superficial siderosis. Vascular: Major intracranial arterial and venous sinus flow voids are preserved. Skull and upper cervical spine: The visualized skull base, calvarium, upper cervical spine and extracranial soft tissues are normal. Sinuses/Orbits: No fluid levels or advanced mucosal thickening. No mastoid or middle ear effusion. Normal orbits. IMPRESSION: 1. Acute ischemic infarct within the left hemisphere, predominantly located within the left precentral gyrus and frontal operculum, but also involving the left postcentral gyrus. 2. No hemorrhage or mass effect. 3. Findings of chronic microvascular disease. Electronically Signed   By: Ulyses Jarred M.D.   On: 09/13/2017 05:54   Dg Chest Port 1 View  Result Date: 09/19/2017 CLINICAL DATA:  Shortness of breath. EXAM: PORTABLE CHEST 1 VIEW COMPARISON:  09/17/2017 FINDINGS: Enteric catheter descends to the abdomen, collimated off the image. The cardiac silhouette is enlarged. Calcific atherosclerotic disease of the aorta. No evidence of pneumothorax. Bilateral small pleural effusions. Possible left lower lobe airspace consolidation. Ground-glass opacities first nodules in the right lower and right upper lobes. Hyperinflation of the lungs. Osseous structures are without acute abnormality. Soft tissues are grossly normal. IMPRESSION: Bilateral small pleural effusions with patchy left lower lobe airspace consolidation and pulmonary nodules versus areas of ground-glass airspace consolidation in the right upper and right lower lobes. Electronically Signed   By: Fidela Salisbury M.D.   On: 09/19/2017 01:44   Dg Chest Port 1 View  Result Date: 09/17/2017 CLINICAL DATA:  Respiratory failure EXAM: PORTABLE CHEST 1 VIEW COMPARISON:  09/16/2017 FINDINGS: There is hyperinflation of the lungs compatible with COPD.  Cardiomegaly. Patchy bilateral airspace disease again noted, not significantly changed. Areas of consolidation most pronounced in the left base and right upper lobe. Small effusions. IMPRESSION: COPD.  Cardiomegaly. Patchy bilateral airspace disease and small effusions, unchanged. Findings concerning for multifocal pneumonia. Electronically Signed   By: Rolm Baptise M.D.   On: 09/17/2017 07:39   Dg Chest Portable 1 View  Result Date: 09/16/2017 CLINICAL DATA:  Aspiration pneumonia EXAM: PORTABLE CHEST 1 VIEW COMPARISON:  09/15/2017 FINDINGS: Patchy bilateral airspace opacities are again noted superimposed on COPD. Cardiomegaly. No change since prior study. Small effusions, left greater than right. IMPRESSION: Attending patchy bilateral airspace disease and  small effusions. COPD.  Cardiomegaly. No change. Electronically Signed   By: Rolm Baptise M.D.   On: 09/16/2017 09:42   Dg Chest Port 1 View  Result Date: 09/15/2017 CLINICAL DATA:  83 year old female status post left MCA emergent large vessel occlusion and endovascular revascularization. EXAM: PORTABLE CHEST 1 VIEW COMPARISON:  09/13/2017 and earlier. FINDINGS: Portable AP upright view at 1306 hours. Extubated. NG type tube has been removed and an enteric feeding tube courses to the abdomen, tip not occluded. Mildly lower lung volumes. Mildly increased bilateral perihilar opacity, most apparent in the right upper lung. Continued dense opacification at the left lung base. Stable cardiac size and mediastinal contours. Calcified aortic atherosclerosis. Stable visualized osseous structures. IMPRESSION: 1. Extubated.  NG tube removed and feeding tube placed. 2. Lower lung volumes with increasing bilateral perihilar opacity. Consider mild or developing pulmonary edema. 3. Continued dense left lung base opacity compatible with a combination of pleural effusion and lung base collapse/consolidation. Electronically Signed   By: Genevie Ann M.D.   On: 09/15/2017 13:42    Dg Chest Port 1 View  Result Date: 09/13/2017 CLINICAL DATA:  83 year old female status post left MCA emergent large vessel occlusion and endovascular revascularization. EXAM: PORTABLE CHEST 1 VIEW COMPARISON:  Portable chest 09/11/2017 and earlier. FINDINGS: Portable AP semi upright view at 0614 hours. Stable endotracheal tube tip just below the level the clavicles. Enteric tube courses to the abdomen, tip not included. Large lung volumes with coarse bilateral pulmonary interstitial opacity bilateral perihilar opacity has mildly regressed since 09/11/2017. Stable confluent left lung base opacity with obscuration of the left hemidiaphragm. No pneumothorax. No definite right pleural effusion. Stable cardiac size and mediastinal contours. Calcified aortic atherosclerosis. IMPRESSION: 1.  Stable lines and tubes. 2. Continued left pleural effusion with associated left lung base collapse or consolidation. 3. Coarse bilateral pulmonary interstitial opacity but regression of indistinct perihilar opacity (perhaps perihilar pulmonary edema) since 09/11/2017. Electronically Signed   By: Genevie Ann M.D.   On: 09/13/2017 07:40   Dg Chest Port 1 View  Result Date: 09/12/2017 CLINICAL DATA:  Endotracheal and OG tube placements EXAM: PORTABLE CHEST 1 VIEW COMPARISON:  09/11/2017 FINDINGS: Interval placement of an endotracheal tube with tip measuring 4.5 cm above the carina. An enteric tube has been placed. The tip is off the field of view but is well below the left hemidiaphragm. Heart size is normal. Again, there is diffuse bilateral airspace disease with a mostly perihilar distribution. Probable left pleural effusion. No pneumothorax. Calcification of the aorta. IMPRESSION: Appliances appear in satisfactory position. Again, there is diffuse bilateral pulmonary infiltration with small left pleural effusion. Changes likely represent multifocal pneumonia, ARDS, or edema. Electronically Signed   By: Lucienne Capers M.D.    On: 09/12/2017 00:20   Dg Chest Port 1 View  Result Date: 09/11/2017 CLINICAL DATA:  83 year old female with history of fever for 1 day. EXAM: PORTABLE CHEST 1 VIEW COMPARISON:  Chest x-ray 09/07/2017. FINDINGS: Decreasing lung volumes with worsening aeration throughout the mid to lower lungs bilaterally, with air bronchograms evident in the right lower lobe, concerning for progressive multilobar pneumonia. Small right and moderate left pleural effusions. Pulmonary vasculature is obscured. No pneumothorax. Cardiomegaly. The patient is rotated to the left on today's exam, resulting in distortion of the mediastinal contours and reduced diagnostic sensitivity and specificity for mediastinal pathology. Atherosclerosis in the thoracic aorta. IMPRESSION: 1. Findings are compatible with worsening severe multilobar pneumonia, most evident throughout the mid to lower lungs bilaterally.  2. Small right and moderate left parapneumonic pleural effusions. 3. Cardiomegaly. 4. Aortic atherosclerosis. Electronically Signed   By: Vinnie Langton M.D.   On: 09/11/2017 08:09   Dg Chest Port 1 View  Result Date: 09/07/2017 CLINICAL DATA:  Rapid atrial fibrillation. EXAM: PORTABLE CHEST 1 VIEW COMPARISON:  Chest x-ray from yesterday. FINDINGS: The left costophrenic angle is excluded from the field of view. Stable cardiomegaly and increased interstitial markings. Increased patchy opacities in the right lower lobe. No pneumothorax or large pleural effusion. No acute osseous abnormality. IMPRESSION: 1. Increased patchy opacities in the right lower lobe, concerning for aspiration or pneumonia. 2. Stable cardiomegaly and interstitial pulmonary edema. Electronically Signed   By: Titus Dubin M.D.   On: 09/07/2017 16:21   Dg Swallowing Func-speech Pathology  Result Date: 09/14/2017 Completed by Aaron Edelman, SLP Student. Supervised and reviewed by Herbie Baltimore MA CCC-SLP Objective Swallowing Evaluation: Type of Study:  MBS-Modified Barium Swallow Study  Patient Details Name: OZETTA FLATLEY MRN: [id number] Date of Birth: Jan 10, 1932 Today's Date: 09/14/2017 Time: SLP Start Time (ACUTE ONLY): 1020 -SLP Stop Time (ACUTE ONLY): 1037 SLP Time Calculation (min) (ACUTE ONLY): 17 min Past Medical History: Past Medical History: Diagnosis Date . Allergic rhinitis  . Anxiety  . CKD (chronic kidney disease) stage 3, GFR 30-59 ml/min (HCC)  . Emphysema of lung ([place])  . HLD (hyperlipidemia)  . HTN (hypertension)  . Intolerance of drug   orthostatic . Paroxysmal supraventricular tachycardia ([place])  . PVD (peripheral vascular disease) ([place])  . Seizure ([place])  . Syncope and collapse  . Uterine prolapse without mention of vaginal wall prolapse  Past Surgical History: Past Surgical History: Procedure Laterality Date . CARDIOVERSION N/A 11/05/2014  Procedure: CARDIOVERSION;  Surgeon: Candee Furbish, MD;  Location: [facility] ENDOSCOPY;  Service: Cardiovascular;  Laterality: N/A; . cataract surgery  08/2015 . CHOLECYSTECTOMY   . corrective eye surgery    as a child . ESOPHAGOGASTRODUODENOSCOPY N/A 09/09/2017  Procedure: ESOPHAGOGASTRODUODENOSCOPY (EGD);  Surgeon: Mauri Pole, MD;  Location: [facility] ENDOSCOPY;  Service: Endoscopy;  Laterality: N/A; . INTRAMEDULLARY (IM) NAIL INTERTROCHANTERIC Left 11/21/2016  Procedure: INTRAMEDULLARY (IM) NAIL INTERTROCHANTRIC HEMI;  Surgeon: Newt Minion, MD;  Location: [place];  Service: Orthopedics;  Laterality: Left; . IR PERCUTANEOUS ART THROMBECTOMY/INFUSION INTRACRANIAL INC DIAG ANGIO  09/11/2017 . IVD removed   . OPEN REDUCTION INTERNAL FIXATION (ORIF) DISTAL RADIAL FRACTURE Right 08/29/2014  Procedure: OPEN REDUCTION INTERNAL FIXATION (ORIF) DISTAL RADIAL FRACTURE;  Surgeon: Marianna Payment, MD;  Location: [place];  Service: Orthopedics;  Laterality: Right; . RADIOLOGY WITH ANESTHESIA N/A 09/11/2017  Procedure: RADIOLOGY WITH ANESTHESIA;  Surgeon: Luanne Bras, MD;  Location: [place];  Service: Radiology;  Laterality:  N/A; . RF ablation PSVT    summer '10 . stress cardiolite  08/05/93 . TONSILLECTOMY   . TOTAL HIP ARTHROPLASTY Right 08/29/2014  Procedure: Right Hip Hemi Arthroplasty;  Surgeon: Marianna Payment, MD;  Location: [place];  Service: Orthopedics;  Laterality: Right;  Hip procedure 1st wants [employer], [employer], [place].  HPI: Kayla Kent a 83 y.o.femalewith medical history significant forstage III chronic kidney disease, dyslipidemia, chronic atrial fibrillation on Eliquis, hypertension, seizure disorder on AEDs, and recent FNA of thyroid nodule. Initial admit for upper gi bleed but also having progress oxygen needs found to have multifocal pneumonia thought to be due to aspiration pneumonia in the setting of newaphasia and right hemiplegia yesterday requiringmedicalthrombectomy. MRI Brain 11/27 shows acute ischemic infarct within the left hemisphere,  predominantly located within the left precentral gyrus and frontal operculum, also involving the left postcentral gyrus. Pt intubated for thrombectomy on 11/25 - extubated on 11/27.  No Data Recorded Assessment / Plan / Recommendation CHL IP CLINICAL IMPRESSIONS 09/14/2017 Clinical Impression Pt presents with moderate-severe oropharyngeal dysphagia marked by oral holding, delayed swallow initiation to the pyriforms, moderate residue post swallow and aspiration during the swallow with thin and honey-thick liquid consistencies. Pt required MAX cueing and sensory feedback (large boluses/spoon bolus further back) for swallow initiation. Aspirate sensed by patient; however, pt unable to effectively cough to clear aspirate from airway. Puree consistency transited without penetration/aspiration however, resulted in moderate pharyngeal residue. Pt unable to clear residue with multiple swallows; however, chin tuck decreases residue. Pt's dysphagia secondary to neuromuscular impairment following stroke, delayed timing of swallow initiation and possibly recent  extubation. Recommend pt remain NPO until medically ready to repeat objective swallow study. Will continue with SLP intervention targeting PO trials of purees to further readiness for objective study.  SLP Visit Diagnosis Frontal lobe and executive function deficit Attention and concentration deficit following -- Frontal lobe and executive function deficit following Cerebral infarction Impact on safety and function Severe aspiration risk   CHL IP TREATMENT RECOMMENDATION 09/14/2017 Treatment Recommendations Therapy as outlined in treatment plan below   Prognosis 09/14/2017 Prognosis for Safe Diet Advancement Fair Barriers to Reach Goals Cognitive deficits;Severity of deficits;Time post onset Barriers/Prognosis Comment -- CHL IP DIET RECOMMENDATION 09/14/2017 SLP Diet Recommendations NPO Liquid Administration via -- Medication Administration -- Compensations -- Postural Changes --   CHL IP OTHER RECOMMENDATIONS 09/14/2017 Recommended Consults -- Oral Care Recommendations Oral care QID Other Recommendations --   CHL IP FOLLOW UP RECOMMENDATIONS 09/14/2017 Follow up Recommendations Skilled Nursing facility   [facility] IP FREQUENCY AND DURATION 09/14/2017 Speech Therapy Frequency (ACUTE ONLY) min 2x/week Treatment Duration 2 weeks      CHL IP ORAL PHASE 09/14/2017 Oral Phase Impaired Oral - Pudding Teaspoon -- Oral - Pudding Cup -- Oral - Honey Teaspoon Holding of bolus;Delayed oral transit;Premature spillage Oral - Honey Cup Holding of bolus;Delayed oral transit;Premature spillage Oral - Nectar Teaspoon -- Oral - Nectar Cup -- Oral - Nectar Straw -- Oral - Thin Teaspoon -- Oral - Thin Cup Delayed oral transit;Premature spillage Oral - Thin Straw -- Oral - Puree Delayed oral transit;Premature spillage Oral - Mech Soft -- Oral - Regular -- Oral - Multi-Consistency -- Oral - Pill -- Oral Phase - Comment --  CHL IP PHARYNGEAL PHASE 09/14/2017 Pharyngeal Phase Impaired Pharyngeal- Pudding Teaspoon -- Pharyngeal -- Pharyngeal-  Pudding Cup -- Pharyngeal -- Pharyngeal- Honey Teaspoon Moderate aspiration;Delayed swallow initiation-pyriform sinuses;Reduced pharyngeal peristalsis;Reduced airway/laryngeal closure;Reduced tongue base retraction;Penetration/Aspiration during swallow;Pharyngeal residue - valleculae;Pharyngeal residue - pyriform;Pharyngeal residue - posterior pharnyx Pharyngeal Material enters airway, passes BELOW cords and not ejected out despite cough attempt by patient Pharyngeal- Honey Cup -- Pharyngeal -- Pharyngeal- Nectar Teaspoon -- Pharyngeal -- Pharyngeal- Nectar Cup -- Pharyngeal -- Pharyngeal- Nectar Straw -- Pharyngeal -- Pharyngeal- Thin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

## 2017-09-19 NOTE — Significant Event (Signed)
CRITICAL VALUE ALERT  Critical Value:  Troponin 0.04  Date & Time Notied:  09/19/17 3154  Provider Notified: Erin Hearing, NP  Orders Received/Actions taken: Increase fluids to a rate of 47mL/hr. Patient currently in process of being transferred to a Telemetry or stepdown unit.

## 2017-09-19 NOTE — Significant Event (Addendum)
Rapid Response Event Note  Overview: Time Called: 1694 Arrival Time: 0400 Event Type: Respiratory, Hypotension, Other (Comment)  Initial Focused Assessment: Called by primary RN and RT.  Per RN, patient has decompensated over the course of the shift.  RN has been in communication with Rehab MD and has received orders CXR, ABX, MIVF, labs, and Lasix 40mg  IV prior to my arrival.  RT also called with concern of ongoing aspiration and airway protection. Per RN patient has had periods of low saturations and has required oxygen tonight (2L [place]).  Upon arrival, patient appeared lethargic, did follow commands, delayed but oriented, looks sickly.  Overnight has spiked a temp overnight, very weak productive cough. Initially when I walked she was drooling quite a bit and I am concerned for airway protection.  SBP 100s, MAPs 50s, HR 90-125 per RN (patient is not on tele and VS were q-shift), sats 95% on 2L [place], RR 25-30 per RN, visibly was not in respiratory distress but per RN has been labored in breathing.   Interventions: -- Lactic ordered per RRT orders-- -- Other interventions were carried out by primary RN--  Plan of Care (if not transferred): -- once labs result, will have primary RN contact Rehab MD on call, RN is waiting for labs to result and then will page MD.   -- I was called away to a medical emergency [phone], plan for RN follow up labs. I returned at 32, RN did speak with Rehab MD, will consult TRH. VS remained stable but patient is tenuous, requiring frequent suctioning and airway protection is still concerning.   -- TRH to see patient as consult for now--  Event Summary: Name of Physician Notified: Rehab MD by Primary RN at various times.   Outcome: Stayed in room and stabalized  Moni Rothrock, [place]   End Time [phone]

## 2017-09-20 ENCOUNTER — Inpatient Hospital Stay (HOSPITAL_COMMUNITY): Payer: Medicare Other

## 2017-09-20 ENCOUNTER — Telehealth: Payer: Self-pay | Admitting: *Deleted

## 2017-09-20 DIAGNOSIS — R1310 Dysphagia, unspecified: Secondary | ICD-10-CM

## 2017-09-20 DIAGNOSIS — I4891 Unspecified atrial fibrillation: Secondary | ICD-10-CM

## 2017-09-20 DIAGNOSIS — I340 Nonrheumatic mitral (valve) insufficiency: Secondary | ICD-10-CM

## 2017-09-20 LAB — URINE CULTURE: CULTURE: NO GROWTH

## 2017-09-20 LAB — RESPIRATORY PANEL BY PCR
ADENOVIRUS-RVPPCR: NOT DETECTED
BORDETELLA PERTUSSIS-RVPCR: NOT DETECTED
CORONAVIRUS 229E-RVPPCR: NOT DETECTED
CORONAVIRUS OC43-RVPPCR: NOT DETECTED
Chlamydophila pneumoniae: NOT DETECTED
Coronavirus HKU1: NOT DETECTED
Coronavirus NL63: NOT DETECTED
INFLUENZA B-RVPPCR: NOT DETECTED
Influenza A: NOT DETECTED
METAPNEUMOVIRUS-RVPPCR: NOT DETECTED
Mycoplasma pneumoniae: NOT DETECTED
PARAINFLUENZA VIRUS 1-RVPPCR: NOT DETECTED
PARAINFLUENZA VIRUS 2-RVPPCR: NOT DETECTED
PARAINFLUENZA VIRUS 3-RVPPCR: NOT DETECTED
PARAINFLUENZA VIRUS 4-RVPPCR: NOT DETECTED
RESPIRATORY SYNCYTIAL VIRUS-RVPPCR: NOT DETECTED
RHINOVIRUS / ENTEROVIRUS - RVPPCR: NOT DETECTED

## 2017-09-20 LAB — CBC
HCT: 29.3 % — ABNORMAL LOW (ref 36.0–46.0)
Hemoglobin: 9.4 g/dL — ABNORMAL LOW (ref 12.0–15.0)
MCH: 30.3 pg (ref 26.0–34.0)
MCHC: 32.1 g/dL (ref 30.0–36.0)
MCV: 94.5 fL (ref 78.0–100.0)
Platelets: 434 10*3/uL — ABNORMAL HIGH (ref 150–400)
RBC: 3.1 MIL/uL — AB (ref 3.87–5.11)
RDW: 16.1 % — ABNORMAL HIGH (ref 11.5–15.5)
WBC: 16.7 10*3/uL — AB (ref 4.0–10.5)

## 2017-09-20 LAB — GLUCOSE, CAPILLARY
GLUCOSE-CAPILLARY: 102 mg/dL — AB (ref 65–99)
GLUCOSE-CAPILLARY: 107 mg/dL — AB (ref 65–99)
GLUCOSE-CAPILLARY: 94 mg/dL (ref 65–99)
GLUCOSE-CAPILLARY: 98 mg/dL (ref 65–99)
Glucose-Capillary: 89 mg/dL (ref 65–99)
Glucose-Capillary: 166 mg/dL — ABNORMAL HIGH (ref 65–99)
Glucose-Capillary: 139 mg/dL — ABNORMAL HIGH (ref 65–99)

## 2017-09-20 LAB — TROPONIN I: TROPONIN I: 0.04 ng/mL — AB (ref <0.03)

## 2017-09-20 LAB — ECHOCARDIOGRAM COMPLETE: WEIGHTICAEL: 1693.13 [oz_av]

## 2017-09-20 LAB — HEPARIN LEVEL (UNFRACTIONATED)
HEPARIN UNFRACTIONATED: 0.84 [IU]/mL — AB (ref 0.30–0.70)
HEPARIN UNFRACTIONATED: 0.44 [IU]/mL (ref 0.30–0.70)
Heparin Unfractionated: 0.46 IU/mL (ref 0.30–0.70)

## 2017-09-20 MED ORDER — OSMOLITE 1.2 CAL PO LIQD
1000.0000 mL | ORAL | Status: DC
  Administered 2017-09-20 – 2017-09-23 (×4): 1000 mL
  Filled 2017-09-20 (×9): qty 1000

## 2017-09-20 MED ORDER — LEVETIRACETAM 100 MG/ML PO SOLN
250.0000 mg | Freq: Two times a day (BID) | ORAL | Status: DC
  Administered 2017-09-20 – 2017-09-24 (×8): 250 mg
  Filled 2017-09-20 (×12): qty 2.5

## 2017-09-20 MED ORDER — ORAL CARE MOUTH RINSE
15.0000 mL | Freq: Two times a day (BID) | OROMUCOSAL | Status: DC
  Administered 2017-09-20 – 2017-09-28 (×14): 15 mL via OROMUCOSAL

## 2017-09-20 MED ORDER — METOPROLOL TARTRATE 5 MG/5ML IV SOLN
2.5000 mg | Freq: Once | INTRAVENOUS | Status: AC
  Administered 2017-09-20: 2.5 mg via INTRAVENOUS
  Filled 2017-09-20: qty 5

## 2017-09-20 MED ORDER — FREE WATER
200.0000 mL | Freq: Three times a day (TID) | Status: DC
  Administered 2017-09-20 – 2017-09-28 (×16): 200 mL

## 2017-09-20 MED ORDER — LEVOFLOXACIN IN D5W 750 MG/150ML IV SOLN
750.0000 mg | INTRAVENOUS | Status: DC
  Administered 2017-09-21 – 2017-09-27 (×4): 750 mg via INTRAVENOUS
  Filled 2017-09-20 (×4): qty 150

## 2017-09-20 NOTE — Evaluation (Signed)
Clinical/Bedside Swallow Evaluation Patient Details  Name: Kayla Kent MRN: [id number] Date of Birth: [birth year]  Today's Date: 09/20/2017 Time: SLP Start Time (ACUTE ONLY): 9326 SLP Stop Time (ACUTE ONLY): 1345 SLP Time Calculation (min) (ACUTE ONLY): 10 min  Past Medical History:  Past Medical History:  Diagnosis Date  . Allergic rhinitis   . Anxiety   . CKD (chronic kidney disease) stage 3, GFR 30-59 ml/min (HCC)   . Emphysema of lung ([place])   . HLD (hyperlipidemia)   . HTN (hypertension)   . Intolerance of drug    orthostatic  . Paroxysmal supraventricular tachycardia ([place])   . PVD (peripheral vascular disease) ([place])   . Seizure ([place])   . Syncope and collapse   . Uterine prolapse without mention of vaginal wall prolapse    Past Surgical History:  Past Surgical History:  Procedure Laterality Date  . CARDIOVERSION N/A 11/05/2014   Procedure: CARDIOVERSION;  Surgeon: Candee Furbish, MD;  Location: [facility] ENDOSCOPY;  Service: Cardiovascular;  Laterality: N/A;  . cataract surgery  08/2015  . CHOLECYSTECTOMY    . corrective eye surgery     as a child  . ESOPHAGOGASTRODUODENOSCOPY N/A 09/09/2017   Procedure: ESOPHAGOGASTRODUODENOSCOPY (EGD);  Surgeon: Mauri Pole, MD;  Location: [facility] ENDOSCOPY;  Service: Endoscopy;  Laterality: N/A;  . INTRAMEDULLARY (IM) NAIL INTERTROCHANTERIC Left 11/21/2016   Procedure: INTRAMEDULLARY (IM) NAIL INTERTROCHANTRIC HEMI;  Surgeon: Newt Minion, MD;  Location: [place];  Service: Orthopedics;  Laterality: Left;  . IR PERCUTANEOUS ART THROMBECTOMY/INFUSION INTRACRANIAL INC DIAG ANGIO  09/11/2017  . IVD removed    . OPEN REDUCTION INTERNAL FIXATION (ORIF) DISTAL RADIAL FRACTURE Right 08/29/2014   Procedure: OPEN REDUCTION INTERNAL FIXATION (ORIF) DISTAL RADIAL FRACTURE;  Surgeon: Marianna Payment, MD;  Location: [place];  Service: Orthopedics;  Laterality: Right;  . RADIOLOGY WITH ANESTHESIA N/A 09/11/2017   Procedure: RADIOLOGY WITH ANESTHESIA;   Surgeon: Luanne Bras, MD;  Location: [place];  Service: Radiology;  Laterality: N/A;  . RF ablation PSVT     summer '10  . stress cardiolite  08/05/93  . TONSILLECTOMY    . TOTAL HIP ARTHROPLASTY Right 08/29/2014   Procedure: Right Hip Hemi Arthroplasty;  Surgeon: Marianna Payment, MD;  Location: [place];  Service: Orthopedics;  Laterality: Right;  Hip procedure 1st wants [employer], [employer], [place].    HPI:  Pt is an 83 y.o.femalewith medical history significant for stage III chronic kidney disease, dyslipidemia, chronic atrial fibrillation previously on Eliquis, hypertension, seizure disorder on Keppra, and recent outpatient fine-needle aspiration of thyroid nodule. 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 She was intubated 11/25-11/27. MBS completed 11/28 showed a mdoerate-severe oropharyngeal dysphagia with delays in swallow initiation adn pharyngeal weakness. She was recommended to remain NPO  with therapeutic trials of puree. Pt went to CIR on 11/30 where she continued to work on swallow initiation but was readmitted to the hospital on 12/3 with suspected PNA.   Assessment / Plan / Recommendation Clinical Impression  Pt is lethargic this afternoon, felt to be at least in part medication related as RN reports pt had been more alert until she recently received her keppra. She can be aroused but needs Max cues to sustain her arousal even briefly. SLP provided oral care with minimal changes in level of alertness. Max cues were provided for volitional cough and swallow, with  intermittent response. She only swallowed x1 and her cough sounded very weak. Audible wetness noted is concerning for decreased ability to manage her secretions. PO trials were held given the above - will continue to follow for dysphagia therapy, but recommend maintaining NPO status for now. SLP Visit  Diagnosis: Dysphagia, oropharyngeal phase (R13.12)    Aspiration Risk  Severe aspiration risk;Risk for inadequate nutrition/hydration    Diet Recommendation NPO;Alternative means - temporary   Medication Administration: Via alternative means    Other  Recommendations Oral Care Recommendations: Oral care QID Other Recommendations: Have oral suction available   Follow up Recommendations Inpatient Rehab      Frequency and Duration min 2x/week  2 weeks       Prognosis Prognosis for Safe Diet Advancement: Fair Barriers to Reach Goals: Cognitive deficits;Severity of deficits;Time post onset      Swallow Study   General HPI: Pt is an 83 y.o.femalewithwith medical history significant for stage III chronic kidney disease, dyslipidemia, chronic atrial fibrillation previously on Eliquis, hypertension, seizure disorder on Keppra, and recent outpatient fine-needle aspiration of thyroid nodule. 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 She was intubated 11/25-11/27. MBS completed 11/28 showed a mdoerate-severe oropharyngeal dysphagia with delays in swallow initiation adn pharyngeal weakness. She was recommended to remain NPO  with therapeutic trials of puree. Pt went to CIR on 11/30 where she continued to work on swallow initiation but was readmitted to the hospital on 12/3 with suspected PNA. Type of Study: Bedside Swallow Evaluation Previous Swallow Assessment: see HPI Diet Prior to this Study: NPO;NG Tube Temperature Spikes Noted: No Respiratory Status: Nasal cannula History of Recent Intubation: Yes Length of Intubations (days): 3 days Date extubated: 09/13/17 Behavior/Cognition: Alert;Cooperative;Requires cueing Oral Care Completed by SLP: Yes Oral Cavity - Dentition: Adequate natural dentition Self-Feeding Abilities: Needs assist Patient Positioning: Upright in bed Baseline  Vocal Quality: Not observed Volitional Cough: Weak Volitional Swallow: (elicited x1)    Oral/Motor/Sensory Function     Ice Chips Ice chips: Not tested   Thin Liquid Thin Liquid: Not tested    Nectar Thick Nectar Thick Liquid: Not tested   Honey Thick Honey Thick Liquid: Not tested   Puree Puree: Not tested   Solid   GO   Solid: Not tested        Germain Osgood 09/20/2017,2:01 PM   Germain Osgood, M.A. CCC-SLP [phone]

## 2017-09-20 NOTE — Telephone Encounter (Signed)
Pt was on the TCM report admitted on 09/06/17 after found down and noted to be confused and agitated on eval by EMS. Work up revealed tarry stools due to UGIB, fever due to ESBL UTI and aspiration PNA, Afib with RVR as well as acute on chronic CHF. Mrs.  Chiusano was admitted to rehab 09/16/2017 for inpatient therapies to consist of PT, ST and OT. Pt has been d/c from rehab and transferred back to step down for respiratory decline due to lethargy on 09/19/17....Kayla Kent

## 2017-09-20 NOTE — Progress Notes (Signed)
ANTICOAGULATION CONSULT NOTE - Follow Up Consult  Pharmacy Consult for Heparin Indication: Afib and CVA  Patient Measurements: Weight: 105 lb 13.1 oz (48 kg)  IBW: 61 kg Heparin Dosing Weight: 48 kg  Vital Signs: Temp: 98.6 F (37 C) (12/04 1653) Temp Source: Oral (12/04 1653) BP: 133/41 (12/04 1653) Pulse Rate: 104 (12/04 1653)  Labs: Recent Labs    09/18/17 0448  09/19/17 0138 09/19/17 0407  09/19/17 1418 09/19/17 1922 09/20/17 0137 09/20/17 1013 09/20/17 1838  HGB 9.5*  --  9.1*  --   --   --   --  9.4*  --   --   HCT 30.1*  --  28.4*  --   --   --   --  29.3*  --   --   PLT 451*  --  417*  --   --   --   --  434*  --   --   HEPARINUNFRC 0.19*   < > 0.29*  --    < >  --  0.63 0.84* 0.44 0.46  CREATININE  --   --   --  0.72  --   --   --   --   --   --   TROPONINI  --   --   --   --    < > 0.04* 0.04* 0.04*  --   --    < > = values in this interval not displayed.    Estimated Creatinine Clearance: 40.4 mL/min (by C-G formula based on SCr of 0.72 mg/dL).  Medications:  Infusions:  . feeding supplement (OSMOLITE 1.2 CAL) 1,000 mL (09/20/17 1334)  . heparin 850 Units/hr (09/20/17 0213)  . [START ON 09/21/2017] levofloxacin (LEVAQUIN) IV      Assessment: 25 YOF who continues on Heparin for Aifb and new CVA 11/25 (PTA Eliquis on hold d/t NPO). -heparin level confirmed at goal   Goal of Therapy:  Heparin level 0.3-0.5 units/ml Monitor platelets by anticoagulation protocol: Yes   Plan:  - Continue Heparin at 850 units/hr (8.5 ml/hr) -Daily heparin level and CBC  Hildred Laser, Pharm D 09/20/2017 7:25 PM

## 2017-09-20 NOTE — Plan of Care (Signed)
Pt stable, respiratory status improving but still rhonchus at times

## 2017-09-20 NOTE — Progress Notes (Signed)
ANTICOAGULATION CONSULT NOTE - Follow Up Consult  Pharmacy Consult for Heparin Indication: Afib and CVA  Patient Measurements: Weight: 105 lb 13.1 oz (48 kg)  IBW: 61 kg Heparin Dosing Weight: 48 kg  Vital Signs: Temp: 98.4 F (36.9 C) (12/04 0908) Temp Source: Axillary (12/04 0908) BP: 139/65 (12/04 0908) Pulse Rate: 100 (12/04 0908)  Labs: Recent Labs    09/18/17 0448  09/19/17 0138 09/19/17 0407  09/19/17 1051  09/19/17 1418 09/19/17 1922 09/20/17 0137  HGB 9.5*  --  9.1*  --   --   --   --   --   --  9.4*  HCT 30.1*  --  28.4*  --   --   --   --   --   --  29.3*  PLT 451*  --  417*  --   --   --   --   --   --  434*  HEPARINUNFRC 0.19*   < > 0.29*  --   --  0.59  --   --  0.63 0.84*  CREATININE  --   --   --  0.72  --   --   --   --   --   --   TROPONINI  --   --   --   --    < >  --    < > 0.04* 0.04* 0.04*   < > = values in this interval not displayed.    Estimated Creatinine Clearance: 40.4 mL/min (by C-G formula based on SCr of 0.72 mg/dL).  Medications:  Infusions:  . heparin 850 Units/hr (09/20/17 0213)  . levETIRAcetam 250 mg (09/20/17 0845)  . levofloxacin (LEVAQUIN) IV Stopped (09/20/17 4128)    Assessment: 69 YOF who continues on Heparin for Aifb and new CVA 11/25 (PTA Eliquis on hold d/t NPO).  Heparin level this morning is therapeutic after a rate decrease earlier today (HL 0.44 << 0.84, goal of 0.3-0.5). CBC low but stable.   Will also adjust Levaquin for renal function. SCr 0.72, CrCl~40 ml/min - dosing should be 750 mg IV every 48 hours. Last dose of 500 mg on 12/4 AM - will start new dose on 12/5 PM.  Goal of Therapy:  Heparin level 0.3-0.5 units/ml Monitor platelets by anticoagulation protocol: Yes   Plan:  - Continue Heparin at 850 units/hr (8.5 ml/hr) - Will continue to monitor for any signs/symptoms of bleeding and will follow up with heparin level in 8 hours to confirm therapeutic - Adjust Levaquin to 750 mg IV every 48  hours  Thank you for allowing pharmacy to be a part of this patient's care.  Alycia Rossetti, PharmD, BCPS Clinical Pharmacist Pager: [phone] Clinical phone for 09/20/2017 from 7a-3:30p: [phone] If after 3:30p, please call main pharmacy at: [phone] 09/20/2017 10:28 AM

## 2017-09-20 NOTE — Progress Notes (Signed)
Speech-Language Pathology Note:  RN shared that the pt's daughter had questions about swallowing evaluations, therefore SLP returned to the room to assist in answering questions. Pt was soundly sleeping throughout our conversation. Her daughter expressed her concerns about not having a swallow study completed in a week, saying that she had been told that the pt needed to be more alert to participate in it. She was frustrated that we were not coming during the time of day when she is more alert. She is also upset that the feeding tube has been in for as long as it has. I acknowledged her frustration, but shared with her my concerns about her oropharyngeal function s/p CVA, and that at this point when we are working on getting her to initiate a swallow, it may likely be a more prolonged period before she can sustain herself nutritionally. I showed her videos from her initial MBS to better illustrate the difficulty she was having with airway protection, and explained why I was concerned that she was not managing her secretions well.   At this point, she shared that in her mother's living will she outlines that she does not want to be sustained by artificial feeding (although she would agree to it in the short-term, even a PEG, if it was a bridge to having it removed after a recovery period). She is concerned that she went to rehab too quickly, and she is not sure if she wants to put her through intensive therapy if at the end she still has a significant dysphagia. With this in mind, I think that we should look at repeating her MBS sooner rather than later, as soon as she is alert enough to participate, to look for any signs of progress in swallowing since her initial study. I reiterated to the daughter that this is different from our original goals, which would have been to repeat this study when she showed signs of clinical progress in hopes of starting a diet. Instead, this would largely be to obtain information  to help formulate GOC.   Pt's daughter was agreeable to this plan and requested that we come earlier in the day when the pt may be more alert. I will attempt to do so in order to proceed with MBS as soon as it appears as though the patient can participate.   Germain Osgood, M.A. CCC-SLP [phone]

## 2017-09-20 NOTE — Evaluation (Signed)
Physical Therapy Evaluation Patient Details Name: Kayla Kent MRN: [id number] DOB: [birth year] Today's Date: 09/20/2017   History of Present Illness  pt is an 83 y/o female with pmh significant for CKD3, chronic afib on Eliquis, HT, seizure d/o, recent L MCA CVA with aphasia and R hemiparesis, R UE densely hemiplegic, readmitted from CIR with fever and suspected pneumonia.  Clinical Impression  Pt admitted with/for fever and s/s of PNA.  Hopefully pt can return to CIR once appropriate.  Pt requiring mod to max for basic mobility on eval..  Pt currently limited functionally due to the problems listed. ( See problems list.)   Pt will benefit from PT to maximize function and safety in order to get ready for next venue listed below.     Follow Up Recommendations CIR    Equipment Recommendations  None recommended by PT    Recommendations for Other Services Rehab consult     Precautions / Restrictions Precautions Precautions: Fall Precaution Comments: R hemiplegia      Mobility  Bed Mobility Overal bed mobility: Needs Assistance Bed Mobility: Rolling;Supine to Sit;Sit to Supine Rolling: Max assist   Supine to sit: Mod assist Sit to supine: Min assist   General bed mobility comments: pt need assist to come forward, then assisted minimally to scoot to EOB  Transfers Overall transfer level: Needs assistance   Transfers: Sit to/from Stand Sit to Stand: Max assist         General transfer comment: pt refused OOB, but did stand x1 to scoot toward HOB.  Pt refused further stands.  Pt needed maximal face to face assist  Ambulation/Gait             General Gait Details: NT today  Stairs            Wheelchair Mobility    Modified Rankin (Stroke Patients Only) Modified Rankin (Stroke Patients Only) Pre-Morbid Rankin Score: No symptoms Modified Rankin: Severe disability     Balance Overall balance assessment: Needs assistance Sitting-balance support: No  upper extremity supported Sitting balance-Leahy Scale: Fair Sitting balance - Comments: min guard.  pt able to w/shift R weakly and return to midline.  No problem with w/shift L     Standing balance-Leahy Scale: Zero                               Pertinent Vitals/Pain Pain Assessment: No/denies pain    Home Living Family/patient expects to be discharged to:: Private residence Living Arrangements: Alone Available Help at Discharge: Family Type of Home: House Home Access: Stairs to enter Entrance Stairs-Rails: None Entrance Stairs-Number of Steps: 1 Home Layout: One level Home Equipment: Grab bars - tub/shower;Shower seat;Walker - 2 wheels      Prior Function Level of Independence: Independent         Comments: Reports independence with ADL and home management. Does not use RW per her report.      Hand Dominance   Dominant Hand: Right    Extremity/Trunk Assessment   Upper Extremity Assessment Upper Extremity Assessment: Defer to OT evaluation RUE Deficits / Details: no voluntary or spontaneous movement noted R UE RUE Coordination: decreased fine motor;decreased gross motor    Lower Extremity Assessment Lower Extremity Assessment: RLE deficits/detail;LLE deficits/detail RLE Deficits / Details: noted isolated movements at >=3+ except 3/5 df/pf RLE Coordination: decreased fine motor;decreased gross motor LLE Deficits / Details: generalized weakness  grossly 3+ overall and not  as apt to follow commands for the left LE    Cervical / Trunk Assessment Cervical / Trunk Assessment: Kyphotic;Other exceptions  Communication   Communication: No difficulties  Cognition Arousal/Alertness: Awake/alert Behavior During Therapy: Flat affect Overall Cognitive Status: Impaired/Different from baseline Area of Impairment: Attention;Orientation;Following commands;Safety/judgement;Problem solving;Awareness;Memory                 Orientation Level: Time Current  Attention Level: Sustained   Following Commands: Follows one step commands with increased time Safety/Judgement: Decreased awareness of deficits;Decreased awareness of safety Awareness: Intellectual Problem Solving: Decreased initiation;Slow processing General Comments: No verbalizations      General Comments      Exercises     Assessment/Plan    PT Assessment Patient needs continued PT services  PT Problem List Decreased strength;Decreased activity tolerance;Decreased balance;Decreased mobility;Decreased coordination;Impaired sensation       PT Treatment Interventions DME instruction;Gait training;Functional mobility training;Therapeutic activities;Therapeutic exercise;Neuromuscular re-education;Patient/family education    PT Goals (Current goals can be found in the Care Plan section)  Acute Rehab PT Goals Patient Stated Goal: per daughter to be independent again PT Goal Formulation: Patient unable to participate in goal setting Time For Goal Achievement: 10/04/17 Potential to Achieve Goals: Fair    Frequency Min 3X/week   Barriers to discharge        Co-evaluation               AM-PAC PT "6 Clicks" Daily Activity  Outcome Measure Difficulty turning over in bed (including adjusting bedclothes, sheets and blankets)?: Unable Difficulty moving from lying on back to sitting on the side of the bed? : Unable Difficulty sitting down on and standing up from a chair with arms (e.g., wheelchair, bedside commode, etc,.)?: Unable Help needed moving to and from a bed to chair (including a wheelchair)?: A Lot Help needed walking in hospital room?: Total Help needed climbing 3-5 steps with a railing? : Total 6 Click Score: 7    End of Session   Activity Tolerance: Patient tolerated treatment well Patient left: in bed;with call bell/phone within reach;with bed alarm set Nurse Communication: Mobility status PT Visit Diagnosis: Muscle weakness (generalized)  (M62.81);Hemiplegia and hemiparesis;Other abnormalities of gait and mobility (R26.89) Hemiplegia - Right/Left: Right Hemiplegia - dominant/non-dominant: Dominant Hemiplegia - caused by: Cerebral infarction    Time: 1707-1730 PT Time Calculation (min) (ACUTE ONLY): 23 min   Charges:   PT Evaluation $PT Eval Moderate Complexity: 1 Mod PT Treatments $Therapeutic Activity: 8-22 mins   PT G Codes:        04-Oct-2017  Donnella Sham, PT [phone]  (pager)  Tessie Fass Breven Guidroz Oct 04, 2017, 5:49 PM

## 2017-09-20 NOTE — Progress Notes (Addendum)
PROGRESS NOTE    Kayla Kent   NFA:[id number]  DOB: Mar 27, 1932  DOA: 09/19/2017 PCP: Biagio Borg, MD   Brief Narrative:   Kayla Kent is a 83 y.o. female with medical history significant for stage III chronic kidney disease, dyslipidemia, chronic atrial fibrillation previously on Eliquis, hypertension, seizure disorder on Keppra, and recent outpatient fine-needle aspiration of thyroid nodule. Patient was initially admitted on 11/20 secondary to acute upper GI bleeding as well as multidrug-resistant Klebsiella pneumonia.    On 11/25 patient was aphasic with respiratory distress and was later found to have an acute MCA stroke.  Her eliquis had been held due to the GI bleed.  11/25: underwent thrombolysis of left middle cerebral artery She was treated for aspiration pneumonia as well and was intubated. She was eventually discharged to CIR on 11/30 On 12/3 she was evaluated for fever and suspected to have pneumonia and readmitted to the hospital.   Subjective: Will not talk to me.     Assessment & Plan:   Active Problems:   Acute respiratory failure with hypoxia (HCC) - leukocytosis - possible Pneumonia again- low grade fever yesterday with possible Hypoxia- now is 100% on 2 L O2 - apparently completed 5 days of antibiotics on 11/25 - on last admission: sputum and blood cultures were negative & resp virus panel was negative - now on Levaquin- will continue to follow  Recent CVA with dysphagia - will return to CIR when stable - has been on IV Heparin at CIR- continue  - cont tube feeds    A-fib/ flutter - not on rate controlling agent- cont Heparin  Seizure disorder - cont Keppra  GI bleed? EGD was normal on 11/23  DVT prophylaxis: heparin infusion Code Status: Full code Family Communication:  Disposition Plan: return to CIR when stable Consultants:     Procedures:  2 D ECHO  Left ventricle: The cavity size was normal. Wall thickness was   increased in a  pattern of mild LVH. Systolic function was normal.   The estimated ejection fraction was in the range of 60% to 65%.   Wall motion was normal; there were no regional wall motion   abnormalities. - Mitral valve: There was mild regurgitation. - Left atrium: The atrium was severely dilated. - Right atrium: The atrium was mildly dilated. - Pulmonary arteries: Systolic pressure was mildly increased. PA   peak pressure: 33 mm Hg (S).   Antimicrobials:  Anti-infectives (From admission, onward)   Start     Dose/Rate Route Frequency Ordered Stop   09/21/17 2200  levofloxacin (LEVAQUIN) IVPB 750 mg     750 mg 100 mL/hr over 90 Minutes Intravenous Every 48 hours 09/20/17 1030     09/20/17 0500  levofloxacin (LEVAQUIN) IVPB 500 mg  Status:  Discontinued     500 mg 100 mL/hr over 60 Minutes Intravenous Every 24 hours 09/19/17 1443 09/20/17 1030   09/19/17 1345  levofloxacin (LEVAQUIN) IVPB 750 mg  Status:  Discontinued     750 mg 100 mL/hr over 90 Minutes Intravenous Every 24 hours 09/19/17 1339 09/19/17 1444       Objective: Vitals:   09/20/17 0400 09/20/17 0427 09/20/17 0831 09/20/17 0908  BP: (!) 135/54   139/65  Pulse: 99   100  Resp: 18   16  Temp: 97.8 F (36.6 C)   98.4 F (36.9 C)  TempSrc: Oral   Axillary  SpO2: 99%  100% 99%  Weight:  48 kg (105 lb  13.1 oz)      Intake/Output Summary (Last 24 hours) at 09/20/2017 1224 Last data filed at 09/20/2017 0645 Gross per 24 hour  Intake 1087.16 ml  Output 300 ml  Net 787.16 ml   Filed Weights   09/19/17 1340 09/20/17 0427  Weight: 46.5 kg (102 lb 8.2 oz) 48 kg (105 lb 13.1 oz)    Examination: General exam: Appears comfortable  HEENT: PERRLA, oral mucosa moist, no sclera icterus or thrush Respiratory system: Clear to auscultation. Respiratory effort normal. Pulse ox 100% on 2 L Cardiovascular system: S1 & S2 heard, RRR.  No murmurs  Gastrointestinal system: Abdomen soft, non-tender, nondistended. Normal bowel sound. No  organomegaly- NG tube present Central nervous system: sleepy - difficult to assess Extremities: No cyanosis, clubbing or edema Skin: No rashes or ulcers      Data Reviewed: I have personally reviewed following labs and imaging studies  CBC: Recent Labs  Lab 09/14/17 0429 09/15/17 0608 09/16/17 0430 09/17/17 0207 09/18/17 0448 09/19/17 0138 09/20/17 0137  WBC 10.0 11.0* 10.4 15.7* 16.4* 14.6* 16.7*  NEUTROABS 8.1* 9.1* 8.0* 12.9*  --  11.4*  --   HGB 9.8* 10.3* 9.6* 9.7* 9.5* 9.1* 9.4*  HCT 29.9* 31.8* 30.0* 30.3* 30.1* 28.4* 29.3*  MCV 93.1 93.3 94.0 94.4 95.6 95.0 94.5  PLT 329 391 417* 420* 451* 417* 737*   Basic Metabolic Panel: Recent Labs  Lab 09/14/17 0429 09/14/17 1622 09/15/17 0608 09/15/17 1626 09/16/17 0430 09/17/17 0207 09/19/17 0407  NA 142  --   --   --  142 140 137  K 3.4*  --   --   --  4.3 4.2 4.2  CL 105  --   --   --  110 109 103  CO2 25  --   --   --  28 25 26   GLUCOSE 66  --   --   --  138* 118* 109*  BUN 28*  --   --   --  27* 25* 19  CREATININE 0.74  --   --   --  0.57 0.60 0.72  CALCIUM 7.9*  --   --   --  7.7* 7.6* 8.1*  MG  --  2.1 2.2 2.2 2.4 2.1  --   PHOS  --  2.6 2.6 2.2* 2.2* 2.2*  --    GFR: Estimated Creatinine Clearance: 40.4 mL/min (by C-G formula based on SCr of 0.72 mg/dL). Liver Function Tests: Recent Labs  Lab 09/17/17 0207  AST 59*  ALT 70*  ALKPHOS 92  BILITOT 0.4  PROT 4.9*  ALBUMIN 2.0*   No results for input(s): LIPASE, AMYLASE in the last 168 hours. No results for input(s): AMMONIA in the last 168 hours. Coagulation Profile: No results for input(s): INR, PROTIME in the last 168 hours. Cardiac Enzymes: Recent Labs  Lab 09/19/17 0743 09/19/17 1245 09/19/17 1418 09/19/17 1922 09/20/17 0137  TROPONINI 0.04* 0.03* 0.04* 0.04* 0.04*   BNP (last 3 results) Recent Labs    10/22/16 1135  PROBNP 410.0*   HbA1C: No results for input(s): HGBA1C in the last 72 hours. CBG: Recent Labs  Lab  09/19/17 1622 09/19/17 2010 09/19/17 2342 09/20/17 0358 09/20/17 0905  GLUCAP 102* 98 94 107* 89   Lipid Profile: No results for input(s): CHOL, HDL, LDLCALC, TRIG, CHOLHDL, LDLDIRECT in the last 72 hours. Thyroid Function Tests: No results for input(s): TSH, T4TOTAL, FREET4, T3FREE, THYROIDAB in the last 72 hours. Anemia Panel: No results for input(s): VITAMINB12,  FOLATE, FERRITIN, TIBC, IRON, RETICCTPCT in the last 72 hours. Urine analysis:    Component Value Date/Time   COLORURINE YELLOW 09/19/2017 1722   APPEARANCEUR CLEAR 09/19/2017 1722   APPEARANCEUR Clear 07/22/2017 1022   LABSPEC 1.006 09/19/2017 1722   PHURINE 7.0 09/19/2017 1722   GLUCOSEU NEGATIVE 09/19/2017 1722   GLUCOSEU NEGATIVE 04/13/2017 0839   HGBUR NEGATIVE 09/19/2017 1722   BILIRUBINUR NEGATIVE 09/19/2017 1722   BILIRUBINUR Negative 07/22/2017 1022   KETONESUR NEGATIVE 09/19/2017 1722   PROTEINUR NEGATIVE 09/19/2017 1722   UROBILINOGEN 0.2 07/04/2017 0930   UROBILINOGEN 0.2 04/13/2017 0839   NITRITE NEGATIVE 09/19/2017 1722   LEUKOCYTESUR NEGATIVE 09/19/2017 1722   LEUKOCYTESUR Negative 07/22/2017 1022   Sepsis Labs: @LABRCNTIP (procalcitonin:4,lacticidven:4) ) Recent Results (from the past 240 hour(s))  Culture, blood (routine x 2)     Status: None   Collection Time: 09/11/17  8:32 AM  Result Value Ref Range Status   Specimen Description BLOOD RIGHT ANTECUBITAL  Final   Special Requests   Final    BOTTLES DRAWN AEROBIC AND ANAEROBIC Blood Culture adequate volume   Culture NO GROWTH 5 DAYS  Final   Report Status 09/16/2017 FINAL  Final  Culture, blood (routine x 2)     Status: None   Collection Time: 09/11/17  8:33 AM  Result Value Ref Range Status   Specimen Description BLOOD LEFT HAND  Final   Special Requests   Final    BOTTLES DRAWN AEROBIC AND ANAEROBIC Blood Culture results may not be optimal due to an excessive volume of blood received in culture bottles   Culture NO GROWTH 5 DAYS  Final    Report Status 09/16/2017 FINAL  Final  Culture, Urine     Status: None   Collection Time: 09/11/17 11:00 AM  Result Value Ref Range Status   Specimen Description URINE, CLEAN CATCH  Final   Special Requests NONE  Final   Culture NO GROWTH  Final   Report Status 09/12/2017 FINAL  Final  Respiratory Panel by PCR     Status: None   Collection Time: 09/11/17  2:15 PM  Result Value Ref Range Status   Adenovirus NOT DETECTED NOT DETECTED Final   Coronavirus 229E NOT DETECTED NOT DETECTED Final   Coronavirus HKU1 NOT DETECTED NOT DETECTED Final   Coronavirus NL63 NOT DETECTED NOT DETECTED Final   Coronavirus OC43 NOT DETECTED NOT DETECTED Final   Metapneumovirus NOT DETECTED NOT DETECTED Final   Rhinovirus / Enterovirus NOT DETECTED NOT DETECTED Final   Influenza A NOT DETECTED NOT DETECTED Final   Influenza B NOT DETECTED NOT DETECTED Final   Parainfluenza Virus 1 NOT DETECTED NOT DETECTED Final   Parainfluenza Virus 2 NOT DETECTED NOT DETECTED Final   Parainfluenza Virus 3 NOT DETECTED NOT DETECTED Final   Parainfluenza Virus 4 NOT DETECTED NOT DETECTED Final   Respiratory Syncytial Virus NOT DETECTED NOT DETECTED Final   Bordetella pertussis NOT DETECTED NOT DETECTED Final   Chlamydophila pneumoniae NOT DETECTED NOT DETECTED Final   Mycoplasma pneumoniae NOT DETECTED NOT DETECTED Final  Culture, respiratory (NON-Expectorated)     Status: None   Collection Time: 09/11/17 11:44 PM  Result Value Ref Range Status   Specimen Description TRACHEAL ASPIRATE  Final   Special Requests NONE  Final   Gram Stain   Final    MODERATE WBC PRESENT, PREDOMINANTLY MONONUCLEAR NO SQUAMOUS EPITHELIAL CELLS SEEN NO ORGANISMS SEEN    Culture NO GROWTH 2 DAYS  Final   Report Status  09/14/2017 FINAL  Final  Culture, blood (Routine X 2) w Reflex to ID Panel     Status: None (Preliminary result)   Collection Time: 09/19/17  7:35 AM  Result Value Ref Range Status   Specimen Description BLOOD LEFT HAND   Final   Special Requests IN PEDIATRIC BOTTLE Blood Culture adequate volume  Final   Culture NO GROWTH 1 DAY  Final   Report Status PENDING  Incomplete  Culture, blood (Routine X 2) w Reflex to ID Panel     Status: None (Preliminary result)   Collection Time: 09/19/17  7:40 AM  Result Value Ref Range Status   Specimen Description BLOOD LEFT HAND  Final   Special Requests IN PEDIATRIC BOTTLE Blood Culture adequate volume  Final   Culture NO GROWTH 1 DAY  Final   Report Status PENDING  Incomplete  Respiratory Panel by PCR     Status: None   Collection Time: 09/19/17  2:17 PM  Result Value Ref Range Status   Adenovirus NOT DETECTED NOT DETECTED Final   Coronavirus 229E NOT DETECTED NOT DETECTED Final   Coronavirus HKU1 NOT DETECTED NOT DETECTED Final   Coronavirus NL63 NOT DETECTED NOT DETECTED Final   Coronavirus OC43 NOT DETECTED NOT DETECTED Final   Metapneumovirus NOT DETECTED NOT DETECTED Final   Rhinovirus / Enterovirus NOT DETECTED NOT DETECTED Final   Influenza A NOT DETECTED NOT DETECTED Final   Influenza B NOT DETECTED NOT DETECTED Final   Parainfluenza Virus 1 NOT DETECTED NOT DETECTED Final   Parainfluenza Virus 2 NOT DETECTED NOT DETECTED Final   Parainfluenza Virus 3 NOT DETECTED NOT DETECTED Final   Parainfluenza Virus 4 NOT DETECTED NOT DETECTED Final   Respiratory Syncytial Virus NOT DETECTED NOT DETECTED Final   Bordetella pertussis NOT DETECTED NOT DETECTED Final   Chlamydophila pneumoniae NOT DETECTED NOT DETECTED Final   Mycoplasma pneumoniae NOT DETECTED NOT DETECTED Final  Culture, Urine     Status: None   Collection Time: 09/19/17  5:21 PM  Result Value Ref Range Status   Specimen Description URINE, RANDOM  Final   Special Requests NONE  Final   Culture NO GROWTH  Final   Report Status 09/20/2017 FINAL  Final         Radiology Studies: Dg Chest 2 View  Result Date: 09/20/2017 CLINICAL DATA:  Acute respiratory failure, hypoxia, syncope EXAM: CHEST  2  VIEW COMPARISON:  Portable chest x-ray of 12 3 and 09/17/2017 FINDINGS: Opacities at both lung bases and in the right upper lobe persists mild consistent with multifocal pneumonia. Small effusions layer posteriorly. There does appear to have been some improvement in mild pulmonary vascular congestion. Cardiomegaly is stable. NG tube extends below the hemidiaphragm. IMPRESSION: 1. Persistent bibasilar opacities as well as right upper lobe opacity most consistent with multifocal pneumonia with small pleural effusions. 2. Some improvement in mild pulmonary vascular congestion. Electronically Signed   By: Ivar Drape M.D.   On: 09/20/2017 08:43   Dg Chest Port 1 View  Result Date: 09/19/2017 CLINICAL DATA:  Shortness of breath. EXAM: PORTABLE CHEST 1 VIEW COMPARISON:  09/17/2017 FINDINGS: Enteric catheter descends to the abdomen, collimated off the image. The cardiac silhouette is enlarged. Calcific atherosclerotic disease of the aorta. No evidence of pneumothorax. Bilateral small pleural effusions. Possible left lower lobe airspace consolidation. Ground-glass opacities first nodules in the right lower and right upper lobes. Hyperinflation of the lungs. Osseous structures are without acute abnormality. Soft tissues are grossly normal. IMPRESSION:  Bilateral small pleural effusions with patchy left lower lobe airspace consolidation and pulmonary nodules versus areas of ground-glass airspace consolidation in the right upper and right lower lobes. Electronically Signed   By: Fidela Salisbury M.D.   On: 09/19/2017 01:44      Scheduled Meds: . aspirin  300 mg Rectal Daily  . ipratropium-albuterol  3 mL Nebulization Q6H  . mouth rinse  15 mL Mouth Rinse BID  . pantoprazole (PROTONIX) IV  40 mg Intravenous Q12H   Continuous Infusions: . heparin 850 Units/hr (09/20/17 0213)  . levETIRAcetam Stopped (09/20/17 0900)  . [START ON 09/21/2017] levofloxacin (LEVAQUIN) IV       LOS: 1 day    Time spent in  minutes: 35    Debbe Odea, MD [facility] Pager: [email or web address] Password [email or web address] 09/20/2017, 12:24 PM

## 2017-09-20 NOTE — Progress Notes (Signed)
ANTICOAGULATION CONSULT NOTE - Follow Up Consult  Pharmacy Consult for heparin Indication: atrial fibrillation and stroke  Labs: Recent Labs    09/18/17 0448  09/19/17 0138 09/19/17 0407  09/19/17 1051 09/19/17 1245 09/19/17 1418 09/19/17 1922 09/20/17 0137  HGB 9.5*  --  9.1*  --   --   --   --   --   --  9.4*  HCT 30.1*  --  28.4*  --   --   --   --   --   --  29.3*  PLT 451*  --  417*  --   --   --   --   --   --  434*  HEPARINUNFRC 0.19*   < > 0.29*  --   --  0.59  --   --  0.63 0.84*  CREATININE  --   --   --  0.72  --   --   --   --   --   --   TROPONINI  --   --   --   --    < >  --  0.03* 0.04* 0.04*  --    < > = values in this interval not displayed.    Assessment: 83yo female remains above goal on heparin despite rate decrease; no gtt issues or signs of bleeding per RN though he notes that rate change was not made until five hours ago.  Goal of Therapy:  Heparin level 0.3-0.5 units/ml   Plan:  Will decrease heparin gtt to 850 units/hr and check level in [place], PharmD, BCPS  09/20/2017,2:14 AM

## 2017-09-20 NOTE — Progress Notes (Signed)
  Echocardiogram 2D Echocardiogram has been performed.  Kayla Kent F 09/20/2017, 3:44 PM

## 2017-09-21 ENCOUNTER — Inpatient Hospital Stay (HOSPITAL_COMMUNITY): Payer: Medicare Other

## 2017-09-21 DIAGNOSIS — K219 Gastro-esophageal reflux disease without esophagitis: Secondary | ICD-10-CM

## 2017-09-21 DIAGNOSIS — N179 Acute kidney failure, unspecified: Secondary | ICD-10-CM

## 2017-09-21 DIAGNOSIS — I482 Chronic atrial fibrillation: Secondary | ICD-10-CM

## 2017-09-21 DIAGNOSIS — R0602 Shortness of breath: Secondary | ICD-10-CM

## 2017-09-21 LAB — COMPREHENSIVE METABOLIC PANEL
ALBUMIN: 2.1 g/dL — AB (ref 3.5–5.0)
ALK PHOS: 95 U/L (ref 38–126)
ALT: 50 U/L (ref 14–54)
AST: 36 U/L (ref 15–41)
Anion gap: 10 (ref 5–15)
BILIRUBIN TOTAL: 0.5 mg/dL (ref 0.3–1.2)
BUN: 16 mg/dL (ref 6–20)
CO2: 26 mmol/L (ref 22–32)
Calcium: 7.7 mg/dL — ABNORMAL LOW (ref 8.9–10.3)
Chloride: 100 mmol/L — ABNORMAL LOW (ref 101–111)
Creatinine, Ser: 0.66 mg/dL (ref 0.44–1.00)
GFR calc Af Amer: >60 mL/min (ref >60)
GFR calc non Af Amer: >60 mL/min (ref >60)
GLUCOSE: 144 mg/dL — AB (ref 65–99)
POTASSIUM: 4 mmol/L (ref 3.5–5.1)
Sodium: 136 mmol/L (ref 135–145)
TOTAL PROTEIN: 5.2 g/dL — AB (ref 6.5–8.1)

## 2017-09-21 LAB — GLUCOSE, CAPILLARY
GLUCOSE-CAPILLARY: 141 mg/dL — AB (ref 65–99)
GLUCOSE-CAPILLARY: 167 mg/dL — AB (ref 65–99)
GLUCOSE-CAPILLARY: 123 mg/dL — AB (ref 65–99)

## 2017-09-21 LAB — CBC
HEMATOCRIT: 27.7 % — AB (ref 36.0–46.0)
HEMOGLOBIN: 8.8 g/dL — AB (ref 12.0–15.0)
MCH: 30.4 pg (ref 26.0–34.0)
MCHC: 31.8 g/dL (ref 30.0–36.0)
MCV: 95.8 fL (ref 78.0–100.0)
Platelets: 412 10*3/uL — ABNORMAL HIGH (ref 150–400)
RBC: 2.89 MIL/uL — ABNORMAL LOW (ref 3.87–5.11)
RDW: 16.5 % — AB (ref 11.5–15.5)
WBC: 9.9 10*3/uL (ref 4.0–10.5)

## 2017-09-21 LAB — MAGNESIUM: Magnesium: 2 mg/dL (ref 1.7–2.4)

## 2017-09-21 LAB — PHOSPHORUS: Phosphorus: 3.1 mg/dL (ref 2.5–4.6)

## 2017-09-21 LAB — HEPARIN LEVEL (UNFRACTIONATED)
HEPARIN UNFRACTIONATED: 0.2 [IU]/mL — AB (ref 0.30–0.70)
Heparin Unfractionated: 0.46 IU/mL (ref 0.30–0.70)

## 2017-09-21 LAB — LEVETIRACETAM LEVEL: Levetiracetam Lvl: 8.2 ug/mL — ABNORMAL LOW (ref 10.0–40.0)

## 2017-09-21 MED ORDER — IPRATROPIUM-ALBUTEROL 0.5-2.5 (3) MG/3ML IN SOLN
3.0000 mL | Freq: Three times a day (TID) | RESPIRATORY_TRACT | Status: DC
  Administered 2017-09-22 – 2017-09-27 (×15): 3 mL via RESPIRATORY_TRACT
  Filled 2017-09-21 (×16): qty 3

## 2017-09-21 NOTE — Progress Notes (Signed)
Son here to visit - says he has not spoken to a Physician in 2 days - text page to [place] and he came and spoke to the patient's son  at length in the room. Pt had just been seen by PT and OT and speech therapy. Pt sitting up in chair supported by pillows. TV on-  Chair alarm on - curtain back - able to visually see pt from nurses station

## 2017-09-21 NOTE — Progress Notes (Signed)
ANTICOAGULATION CONSULT NOTE - Follow Up Consult  Pharmacy Consult for heparin Indication: atrial fibrillation and stroke  Labs: Recent Labs    09/19/17 0138 09/19/17 0407  09/19/17 1418 09/19/17 1922 09/20/17 0137 09/20/17 1013 09/20/17 1838 09/21/17 0420  HGB 9.1*  --   --   --   --  9.4*  --   --  8.8*  HCT 28.4*  --   --   --   --  29.3*  --   --  27.7*  PLT 417*  --   --   --   --  434*  --   --  412*  HEPARINUNFRC 0.29*  --    < >  --  0.63 0.84* 0.44 0.46 0.20*  CREATININE  --  0.72  --   --   --   --   --   --   --   TROPONINI  --   --    < > 0.04* 0.04* 0.04*  --   --   --    < > = values in this interval not displayed.    Assessment: 83yo female now below goal on heparin after two levels at goal; no gtt issues or signs of bleeding per RN.  Goal of Therapy:  Heparin level 0.3-0.5 units/ml   Plan:  Will increase heparin gtt to 950 units/hr and check level in [place], PharmD, BCPS  09/21/2017,5:41 AM

## 2017-09-21 NOTE — Progress Notes (Signed)
Modified Barium Swallow Progress Note  Patient Details  Name: Kayla Kent MRN: [id number] Date of Birth: 04-29-1932  Today's Date: 09/21/2017  Modified Barium Swallow completed.  Full report located under Chart Review in the Imaging Section.  Brief recommendations include the following:  Clinical Impression  Pt continues to present with a moderate-severe oropharyngeal dysphagia. She still has oral holding, right-sided weakness that allows for anterior spillage and incomplete clearance, and delayed swallow initiation to the pyriform sinuses. She has sensed aspiration with honey thick liquids during the swallow that cannot be sufficiently cleared, and moderate pharyngeal residue that is repeatedly penetrated into the airway as pt makes attempts to clear it (both cued and spontaneously) before it falls back in. Pureed solids are penetrated into the airway during the swallow and after, in a similar cyclical manner. Pt has mildly less residue and improved airway protection with honey thick liquids by spoon while using a chin tuck, but she needed Max-Total A for oral acceptance (unable to take boluses in by the cup or straw) and is anteriorly spilling more of what she does take in when in this posture. Study was limited by pt's continued coughing, sputtering, and seemingly difficult attempts at managing her secretions particularly when mixed with barium trials. There is still not a safe diet option to be started - I would continue with therapeutic trials of honey thick liquids or purees by spoon with use of a chin tuck. I think that it would be a laborous and high risk task to try to attempt a PO diet at this time, although the family could consider comfort feeds should they continue to have concerns about the use of a feeding tube.   Swallow Evaluation Recommendations       SLP Diet Recommendations: NPO;Alternative means - temporary       Medication Administration: Via alternative means              Oral Care Recommendations: Oral care QID   Other Recommendations: Have oral suction available    Germain Osgood 09/21/2017,2:28 PM   Germain Osgood, M.A. CCC-SLP [phone]

## 2017-09-21 NOTE — Progress Notes (Signed)
Rehab admissions - patient known to me from recent brief CIR stay.  I will monitor for progress and participation in hopes that we can re admit to inpatient rehab.  Call me for questions.  #[phone]

## 2017-09-21 NOTE — Progress Notes (Signed)
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    PT Comments    Pt admitted with above diagnosis. Pt currently with functional limitations due to balance and endurance deficits.  Pt was able to ambulate with +2 person assist up to 15 feet before becoming fatigued.  Gets fatigued quickly.  Was able to perform some ADLs with OT - see OT note.  Progressing and much more alert today.  Will continue acute PT.   Pt will benefit from skilled PT to increase their independence and safety with mobility to allow discharge to the venue listed below.     Follow Up Recommendations  CIR     Equipment Recommendations  None recommended by PT    Recommendations for Other Services Rehab consult     Precautions / Restrictions Precautions Precautions: Fall Precaution Comments: R hemiplegia Restrictions Weight Bearing Restrictions: No    Mobility  Bed Mobility Overal bed mobility: Needs Assistance Bed Mobility: Rolling;Supine to Sit;Sit to Supine Rolling: Mod assist Sidelying to sit: Mod assist Supine to sit: Mod assist     General bed mobility comments: pt need assist to come forward, then assisted minimally to scoot to EOB  Transfers Overall transfer level: Needs assistance Equipment used: 2 person hand held assist Transfers: Sit to/from Stand Sit to Stand: Max assist;+2 physical assistance;Mod assist         General transfer comment: NEeded assist to power up and was slightly flexed  Ambulation/Gait Ambulation/Gait assistance: +2 physical assistance;Min assist;Mod assist(+1 for chair follow) Ambulation Distance (Feet): 15 Feet Assistive device: 2 person  hand held assist Gait Pattern/deviations: Shuffle;Trunk flexed;Narrow base of support;Step-through pattern;Decreased step length - right;Decreased step length - left;Ataxic   Gait velocity interpretation: Below normal speed for age/gender General Gait Details: Pt was able to walk to door with 2 person assist.  Slightly flexed trunk needing constant cues to stand upright.  Pt with short steps and slightly ataxic.     Stairs            Wheelchair Mobility    Modified Rankin (Stroke Patients Only) Modified Rankin (Stroke Patients Only) Pre-Morbid Rankin Score: No symptoms Modified Rankin: Severe disability     Balance Overall balance assessment: Needs assistance Sitting-balance support: No upper extremity supported Sitting balance-Leahy Scale: Fair Sitting balance - Comments: min guard.  pt able to w/shift R weakly and return to midline.  No problem with w/shift L Postural control: Posterior lean;Right lateral lean Standing balance support: Bilateral upper extremity supported;During functional activity Standing balance-Leahy Scale: Poor Standing balance comment: kyphotic posture with mod assist for standing balance with flexed posture.                            Cognition Arousal/Alertness: Awake/alert Behavior During Therapy: Flat affect Overall Cognitive Status: Difficult to assess(aphasia) Area of Impairment: Attention;Orientation;Following commands;Safety/judgement;Problem solving;Awareness;Memory                 Orientation Level: Time Current Attention Level: Sustained   Following Commands: Follows one step commands with increased time Safety/Judgement: Decreased awareness of deficits;Decreased awareness of safety Awareness: Intellectual Problem Solving: Decreased initiation;Slow processing General Comments:  No verbalizations      Exercises General Exercises - Lower Extremity Long Arc Quad: AROM;Both;10 reps;Seated Hip Flexion/Marching:  AROM;Both;5 reps;Seated    General Comments General comments (skin integrity, edema, etc.): VSS      Pertinent Vitals/Pain Pain Assessment: Faces Faces Pain Scale: No hurt    Home Living Family/patient expects to be discharged to:: Private residence Living Arrangements: Alone Available Help at Discharge: Family Type of Home: House Home Access: Stairs to enter Entrance Stairs-Rails: None Home Layout: One level   Additional Comments: per chart review; also Pt    Prior Function            PT Goals (current goals can now be found in the care plan section) Progress towards PT goals: Progressing toward goals    Frequency    Min 3X/week      PT Plan Current plan remains appropriate    Co-evaluation PT/OT/SLP Co-Evaluation/Treatment: Yes Reason for Co-Treatment: Complexity of the patient's impairments (multi-system involvement);For patient/therapist safety PT goals addressed during session: Mobility/safety with mobility OT goals addressed during session: ADL's and self-care      AM-PAC PT "6 Clicks" Daily Activity  Outcome Measure  Difficulty turning over in bed (including adjusting bedclothes, sheets and blankets)?: Unable Difficulty moving from lying on back to sitting on the side of the bed? : Unable Difficulty sitting down on and standing up from a chair with arms (e.g., wheelchair, bedside commode, etc,.)?: Unable Help needed moving to and from a bed to chair (including a wheelchair)?: A Lot Help needed walking in hospital room?: A Lot Help needed climbing 3-5 steps with a railing? : Total 6 Click Score: 8    End of Session Equipment Utilized During Treatment: Gait belt;Oxygen Activity Tolerance: Patient tolerated treatment well Patient left: with call bell/phone within reach;in chair;with chair alarm set;with family/visitor present(with ST present) Nurse Communication: Mobility status PT Visit Diagnosis: Muscle weakness (generalized) (M62.81);Hemiplegia and  hemiparesis;Other abnormalities of gait and mobility (R26.89) Hemiplegia - Right/Left: Right Hemiplegia - dominant/non-dominant: Dominant Hemiplegia - caused by: Cerebral infarction     Time: 0962-8366 PT Time Calculation (min) (ACUTE ONLY): 23 min  Charges:  $Gait Training: 8-22 mins                    G Codes:       [place] [phone] (pager)    Denice Paradise 09/21/2017, 12:14 PM

## 2017-09-21 NOTE — Progress Notes (Signed)
ANTICOAGULATION CONSULT NOTE - Follow Up Consult  Pharmacy Consult for Heparin Indication: afib, new CVA  Allergies  Allergen Reactions  . Chocolate Hives  . Codeine Other (See Comments)    Patient states she acts crazy  . Diazepam Other (See Comments)    Patient states she acts crazy.  . Fruit & Vegetable Daily [Nutritional Supplements] Hives and Swelling    peaches  . Iohexol Hives     Code: HIVES, Desc: pt gets 13 hr pre-meds, Onset Date: [id number]   . Latex Hives  . Peach [Prunus Persica] Hives  . Peanut-Containing Drug Products Hives and Swelling  . Penicillins Hives, Itching and Swelling    Has patient had a PCN reaction causing immediate rash, facial/tongue/throat swelling, SOB or lightheadedness with hypotension: Yes Has patient had a PCN reaction causing severe rash involving mucus membranes or skin necrosis: No Has patient had a PCN reaction that required hospitalization No Has patient had a PCN reaction occurring within the last 10 years: No If all of the above answers are "NO", then may proceed with Cephalosporin use.   . Strawberry Extract Hives  . Sulfonamide Derivatives Hives    nausea  . Wheat Shortness Of Breath    Shortness of breath  . Wheat Bran Shortness Of Breath  . Sulfamethoxazole Hives  . Crestor [Rosuvastatin Calcium] Rash  . Iodine Rash    Patient Measurements: Weight: 104 lb 11.5 oz (47.5 kg) Heparin Dosing Weight: 47.5 kg  Vital Signs: Temp: 97.8 F (36.6 C) (12/05 1145) Temp Source: Oral (12/05 1145) BP: 117/48 (12/05 1300) Pulse Rate: 82 (12/05 1300)  Labs: Recent Labs    09/19/17 0138 09/19/17 0407  09/19/17 1418 09/19/17 1922 09/20/17 0137  09/20/17 1838 09/21/17 0420 09/21/17 0854 09/21/17 1458  HGB 9.1*  --   --   --   --  9.4*  --   --  8.8*  --   --   HCT 28.4*  --   --   --   --  29.3*  --   --  27.7*  --   --   PLT 417*  --   --   --   --  434*  --   --  412*  --   --   HEPARINUNFRC 0.29*  --    < >  --  0.63 0.84*    < > 0.46 0.20*  --  0.46  CREATININE  --  0.72  --   --   --   --   --   --   --  0.66  --   TROPONINI  --   --    < > 0.04* 0.04* 0.04*  --   --   --   --   --    < > = values in this interval not displayed.    Estimated Creatinine Clearance: 40 mL/min (by C-G formula based on SCr of 0.66 mg/dL).   Assessment:  Anticoag: Heparin for hx afib, new stroke 11/25  (PTA Eliquis)  GIB on admit 11/20, Eliquis 11/24>11/25; AC resume with Heparin 11/28  HL goal 0.3-0.5; EGD showed mild inflammation and gastritis. HL 0.2 this AM and rate adjusted>> next HL 0.46 in goal.  Goal of Therapy:  Heparin level 0.3-0.5 units/ml Monitor platelets by anticoagulation protocol: Yes   Plan:  Continue IV heparin at 950 units/hr Next HL and CBC in AM.  Shawne Eskelson S. Alford Highland, PharmD, [place] Clinical Staff Pharmacist Pager [phone]  Eilene Ghazi Stillinger 09/21/2017,4:25  PM

## 2017-09-21 NOTE — Progress Notes (Signed)
Pt transported per bed  to X-ray for Barium swallow

## 2017-09-21 NOTE — Evaluation (Signed)
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   Clinical Impression   Pt non-verbal during session today, but smiled once during therapy, and was good at participating. Please see OT deficit list below. Pt with some spontaneous movement in RUE, not on command. Pt sat at EOB for seated grooming and MMT, and fatigued going from min guard balance to min A prior to short ambulation to chair. Pt alert, (son reports more active in the mornings) and will benefit from skilled OT in the acute setting prior to return to CIR to maximize safety and independence in ADL and functional transfers.     Follow Up Recommendations  Supervision/Assistance - 24 hour;CIR    Equipment Recommendations  Other (comment)(defer to next venue)    Recommendations for Other Services Rehab consult     Precautions / Restrictions Precautions Precautions: Fall Precaution Comments: R hemiplegia Restrictions Weight Bearing Restrictions: No      Mobility Bed Mobility Overal bed mobility: Needs Assistance Bed Mobility: Rolling;Supine to Sit;Sit to Supine Rolling: Mod assist Sidelying to sit: Mod assist Supine to sit: Mod assist     General bed mobility comments: pt need assist to come forward, then assisted minimally to scoot to EOB  Transfers Overall transfer level: Needs assistance Equipment used: 2 person hand held assist Transfers: Sit to/from Stand Sit to Stand: Max assist;+2 physical assistance;Mod assist         General transfer comment: NEeded assist to power up and was slightly flexed    Balance Overall balance assessment: Needs assistance Sitting-balance  support: No upper extremity supported Sitting balance-Leahy Scale: Fair Sitting balance - Comments: min guard.  pt able to w/shift R weakly and return to midline.  No problem with w/shift L As Pt fatigued sitting EOB Pt required min A or cueing to straighten Postural control: Posterior lean;Right lateral lean Standing balance support: Bilateral upper extremity supported;During functional activity Standing balance-Leahy Scale: Poor Standing balance comment: kyphotic posture with mod assist for standing balance with flexed posture.                           ADL either performed or assessed with clinical judgement   ADL Overall ADL's : Needs assistance/impaired Eating/Feeding: NPO Eating/Feeding Details (indicate cue type and reason): coretrack placed 11/28 Grooming: Moderate assistance;Sitting Grooming Details (indicate cue type and reason): able to wash face and comb hair (reach back of head) with LUE Upper Body Bathing: Moderate assistance;Bed level   Lower Body Bathing: Bed level;Maximal assistance   Upper Body Dressing : Maximal assistance;Sitting   Lower Body Dressing: Bed level;Maximal assistance Lower Body Dressing Details (indicate cue type and reason): total A for socks Toilet Transfer: Maximal assistance;+2 for safety/equipment;BSC;Ambulation;+2 for physical assistance Toilet Transfer Details (indicate cue type and reason): 2 person HHA Toileting- Clothing Manipulation and Hygiene: Total assistance;Sit to/from stand;+2 for physical assistance       Functional mobility during ADLs: Maximal assistance;+2 for physical assistance General ADL Comments: Pt with visual impairments impacting depth perception and coordination     Vision Baseline Vision/History: Wears glasses Wears Glasses: Reading only Vision Assessment?: Vision impaired- to be further tested in  functional context Eye Alignment: Impaired (comment)(R eye laterally rotated) Additional Comments: not  fully evaluated this session     Perception     Praxis      Pertinent Vitals/Pain Pain Assessment: Faces Faces Pain Scale: No hurt Pain Intervention(s): Monitored during session     Hand Dominance Right   Extremity/Trunk Assessment Upper Extremity Assessment Upper Extremity Assessment: RUE deficits/detail RUE Deficits / Details: Pt with random forward flexion and movement of RUE - non purposeful and does not move on command RUE Sensation: decreased light touch;decreased proprioception RUE Coordination: decreased fine motor;decreased gross motor   Lower Extremity Assessment Lower Extremity Assessment: Defer to PT evaluation   Cervical / Trunk Assessment Cervical / Trunk Assessment: Kyphotic   Communication Communication Communication: Expressive difficulties   Cognition Arousal/Alertness: Awake/alert Behavior During Therapy: Flat affect Overall Cognitive Status: Difficult to assess(aphasia) Area of Impairment: Attention;Orientation;Following commands;Safety/judgement;Problem solving;Awareness;Memory                 Orientation Level: Time Current Attention Level: Sustained   Following Commands: Follows one step commands with increased time Safety/Judgement: Decreased awareness of deficits;Decreased awareness of safety Awareness: Intellectual Problem Solving: Decreased initiation;Slow processing General Comments: No verbalizations   General Comments  VSS, Son present for session    Exercises Exercises: General Lower Extremity General Exercises - Lower Extremity Long Arc Quad: AROM;Both;10 reps;Seated Hip Flexion/Marching: AROM;Both;5 reps;Seated   Shoulder Instructions      Home Living Family/patient expects to be discharged to:: Private residence Living Arrangements: Alone Available Help at Discharge: Family Type of Home: House Home Access: Stairs to enter Technical brewer of Steps: 1 Entrance Stairs-Rails: None Home Layout: One level      Bathroom Shower/Tub: Occupational psychologist: Handicapped height Bathroom Accessibility: Yes       Additional Comments: per chart review; also Pt  Lives With: Alone    Prior Functioning/Environment Level of Independence: Independent        Comments: PTA independent with ADL and home management. Does not use RW per her report.         OT Problem List: Decreased strength;Decreased activity tolerance;Impaired balance (sitting and/or standing);Decreased cognition;Decreased safety awareness;Decreased knowledge of use of DME or AE;Decreased knowledge of precautions;Cardiopulmonary status limiting activity;Decreased range of motion;Impaired vision/perception;Decreased coordination;Impaired sensation;Impaired tone;Impaired UE functional use      OT Treatment/Interventions: Self-care/ADL training;Therapeutic exercise;Energy conservation;DME and/or AE instruction;Therapeutic activities;Cognitive remediation/compensation;Patient/family education;Balance training;Neuromuscular education;Visual/perceptual remediation/compensation    OT Goals(Current goals can be found in the care plan section) Acute Rehab OT Goals Patient Stated Goal: none stated this session OT Goal Formulation: Patient unable to participate in goal setting ADL Goals Pt Will Perform Grooming: with min guard assist;sitting Pt Will Transfer to Toilet: with mod assist;stand pivot transfer;bedside commode Pt Will Perform Toileting - Clothing Manipulation and hygiene: with mod assist;sit to/from stand Additional ADL Goal #1: Pt will attend to R arm to perform ADL (putting on lotion, washing hands etc) in 2/3 trials with min A Additional ADL Goal #2: Pt will perform bed mobility as precursor to ADL activity at mod A level  OT Frequency: Min 2X/week   Barriers to D/C:            Co-evaluation PT/OT/SLP Co-Evaluation/Treatment: Yes Reason for Co-Treatment: Complexity of the patient's impairments (multi-system  involvement);For patient/therapist safety;To address functional/ADL transfers PT goals addressed during session: Mobility/safety with mobility OT goals addressed during session: ADL's and self-care      AM-PAC PT "6 Clicks" Daily Activity  Outcome Measure Help from another person eating meals?: Total Help from another person taking care of personal grooming?: A Lot Help from another person toileting, which includes using toliet, bedpan, or urinal?: A Lot Help from another person bathing (including washing, rinsing, drying)?: A Lot Help from another person to put on and taking off regular upper body clothing?: A Lot Help from another person to put on and taking off regular lower body clothing?: Total 6 Click Score: 10   End of Session Equipment Utilized During Treatment: Gait belt Nurse Communication: Mobility status(purewick removed)  Activity Tolerance: Patient tolerated treatment well Patient left: in chair;with call bell/phone within reach;with chair alarm set;with family/visitor present;Other (comment)(SLP in room )  OT Visit Diagnosis: Other abnormalities of gait and mobility (R26.89);Muscle weakness (generalized) (M62.81);Other symptoms and signs involving cognitive function;Apraxia (R48.2);Feeding difficulties (R63.3);Hemiplegia and hemiparesis Hemiplegia - Right/Left: Right Hemiplegia - dominant/non-dominant: Dominant Hemiplegia - caused by: Cerebral infarction                Time: 5784-6962 OT Time Calculation (min): 23 min Charges:  OT General Charges $OT Visit: 1 Visit OT Evaluation $OT Re-eval: 1 Re-eval G-Codes:     Hulda Humphrey OTR/L [place] 09/21/2017, 2:30 PM

## 2017-09-21 NOTE — Plan of Care (Signed)
Pt is tolerating tube feeds well at this time. No acute events overnight.

## 2017-09-21 NOTE — Progress Notes (Signed)
  Speech Language Pathology Treatment: Dysphagia  Patient Details Name: Kayla Kent MRN: [id number] DOB: 10-Dec-1931 Today's Date: 09/21/2017 Time: 4627-0350 SLP Time Calculation (min) (ACUTE ONLY): 15 min  Assessment / Plan / Recommendation Clinical Impression  Pt is much more alert today and is sitting upright in her chair. Her voice is mildly wet at baseline, but she does cough on command. She consumed ice chips with Min cues for swallow initiation, and even performed four additional swallows to command. SLP provided a bolus of puree, with which she displayed more prolonged oral holding, requiring Mod cues for clearance. Pt's family is eager for a repeat MBS to reassess swallowing function and although I have concerns about her readiness to start a diet, I do think that she is much more appropriate to participate in an MBS today to get an update on her swallowing function. Will attempt this afternoon.   HPI HPI: Pt is an 83 y.o.femalewith medical history significant for stage III chronic kidney disease, dyslipidemia, chronic atrial fibrillation previously on Eliquis, hypertension, seizure disorder on Keppra, and recent outpatient fine-needle aspiration of thyroid nodule. 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 She was intubated 11/25-11/27. MBS completed 11/28 showed a mdoerate-severe oropharyngeal dysphagia with delays in swallow initiation adn pharyngeal weakness. She was recommended to remain NPO  with therapeutic trials of puree. Pt went to CIR on 11/30 where she continued to work on swallow initiation but was readmitted to the hospital on 12/3 with suspected PNA.      SLP Plan  MBS       Recommendations  Diet recommendations: NPO Medication Administration: Via alternative means                Oral Care Recommendations: Oral care QID Follow up Recommendations:  Inpatient Rehab SLP Visit Diagnosis: Dysphagia, oropharyngeal phase (R13.12) Plan: MBS       GO                Germain Osgood 09/21/2017, 11:34 AM  Germain Osgood, M.A. CCC-SLP [phone]

## 2017-09-21 NOTE — Evaluation (Signed)
Speech Language Pathology Evaluation Patient Details Name: Kayla Kent MRN: [id number] DOB: 22-Jan-1932 Today's Date: 09/21/2017 Time: 2355-7322 SLP Time Calculation (min) (ACUTE ONLY): 11 min  Problem List:  Patient Active Problem List   Diagnosis Date Noted  . Acute respiratory failure with hypoxia ([place]) 09/19/2017  . Dysphagia 09/19/2017  . History of GI bleed   . Acute ischemic left MCA stroke ([place]) 09/16/2017  . Cerebral thrombosis with cerebral infarction 09/13/2017  . Fever   . Aspiration pneumonia due to gastric secretions ([place])   . Leukocytosis   . Acute hypoxemic respiratory failure ([place]) 09/11/2017  . Melena   . Pressure injury of skin 09/09/2017  . Heme positive stool   . Acute upper GI bleed 09/06/2017  . HTN (hypertension) 09/06/2017  . Seizure disorder ([place]) 09/06/2017  . Chronic atrial fibrillation ([place]) 09/06/2017  . Thyroid nodule 09/06/2017  . CKD (chronic kidney disease), stage III ([place]) 09/06/2017  . Dyslipidemia 09/06/2017  . COPD (chronic obstructive pulmonary disease) ([place]) 09/06/2017  . CKD (chronic kidney disease) stage 3, GFR 30-59 ml/min (HCC)   . Malignant neoplasm of right upper lobe of lung ([place]) 06/21/2017  . Pulmonary emphysema ([place]) 04/26/2017  . Pleural effusion 04/26/2017  . Near syncope   . Gastroesophageal reflux disease   . Chest pain 04/14/2017  . Lung nodule < 6cm on CT 04/12/2017  . Acute renal failure ([place])   . Sepsis ([place]) 04/06/2017  . Atrial fibrillation with RVR ([place]) 04/06/2017  . Urinary frequency 03/03/2017  . Hemorrhoids 03/03/2017  . Insomnia 03/03/2017  . Postoperative anemia due to acute blood loss 11/24/2016  . Hematoma left thigh 11/24/2016  . Closed femur fracture ([place]) 11/20/2016  . Pedal edema 10/10/2016  . Persistent atrial fibrillation ([place]) 12/11/2014  . S/P ablation of atrial fibrillation 12/11/2014  . Orthostatic hypotension 12/11/2014  . Paroxysmal SVT (supraventricular tachycardia) ([place]) 12/11/2014   . Atherosclerotic peripheral vascular disease ([place]) 12/11/2014  . Paroxysmal atrial fibrillation ([place]) 11/05/2014  . Orthostasis 10/03/2014  . Non-compliant behavior 10/01/2014  . TIA (transient ischemic attack) 09/23/2014  . Numbness of left hand   . Acute on chronic diastolic heart failure ([place]) 09/21/2014  . Fracture of femoral neck, right ([place]) 08/28/2014  . Distal radius fracture, right 08/28/2014  . Hip fracture requiring operative repair ([place]) 08/28/2014  . Paroxysmal spells 03/28/2014  . Seizures ([place]) 03/28/2013  . Syncope and collapse 07/27/2012  . Routine health maintenance 12/12/2011  . PERIPHERAL CIRCULATORY DISORDER 04/10/2010  . Chronic diastolic CHF (congestive heart failure) ([place]) 07/17/2009  . Depression 07/04/2009  . Anxiety 01/19/2008  . HLD (hyperlipidemia) 11/14/2007  . Essential hypertension 11/14/2007  . PAROXYSMAL ATRIAL TACHYCARDIA 11/14/2007  . UTERINE PROLAPSE 11/14/2007   Past Medical History:  Past Medical History:  Diagnosis Date  . Allergic rhinitis   . Anxiety   . CKD (chronic kidney disease) stage 3, GFR 30-59 ml/min (HCC)   . Emphysema of lung ([place])   . HLD (hyperlipidemia)   . HTN (hypertension)   . Intolerance of drug    orthostatic  . Paroxysmal supraventricular tachycardia ([place])   . PVD (peripheral vascular disease) ([place])   . Seizure ([place])   . Syncope and collapse   . Uterine prolapse without mention of vaginal wall prolapse    Past Surgical History:  Past Surgical History:  Procedure Laterality Date  . CARDIOVERSION N/A 11/05/2014   Procedure: CARDIOVERSION;  Surgeon: Candee Furbish, MD;  Location: [facility] ENDOSCOPY;  Service: Cardiovascular;  Laterality: N/A;  .  cataract surgery  08/2015  . CHOLECYSTECTOMY    . corrective eye surgery     as a child  . ESOPHAGOGASTRODUODENOSCOPY N/A 09/09/2017   Procedure: ESOPHAGOGASTRODUODENOSCOPY (EGD);  Surgeon: Mauri Pole, MD;  Location: [facility] ENDOSCOPY;  Service: Endoscopy;  Laterality:  N/A;  . INTRAMEDULLARY (IM) NAIL INTERTROCHANTERIC Left 11/21/2016   Procedure: INTRAMEDULLARY (IM) NAIL INTERTROCHANTRIC HEMI;  Surgeon: Newt Minion, MD;  Location: [facility];  Service: Orthopedics;  Laterality: Left;  . IR PERCUTANEOUS ART THROMBECTOMY/INFUSION INTRACRANIAL INC DIAG ANGIO  09/11/2017  . IVD removed    . OPEN REDUCTION INTERNAL FIXATION (ORIF) DISTAL RADIAL FRACTURE Right 08/29/2014   Procedure: OPEN REDUCTION INTERNAL FIXATION (ORIF) DISTAL RADIAL FRACTURE;  Surgeon: Marianna Payment, MD;  Location: [facility];  Service: Orthopedics;  Laterality: Right;  . RADIOLOGY WITH ANESTHESIA N/A 09/11/2017   Procedure: RADIOLOGY WITH ANESTHESIA;  Surgeon: Luanne Bras, MD;  Location: [facility];  Service: Radiology;  Laterality: N/A;  . RF ablation PSVT     summer '10  . stress cardiolite  08/05/93  . TONSILLECTOMY    . TOTAL HIP ARTHROPLASTY Right 08/29/2014   Procedure: Right Hip Hemi Arthroplasty;  Surgeon: Marianna Payment, MD;  Location: [facility];  Service: Orthopedics;  Laterality: Right;  Hip procedure 1st wants Peg Board, [employer], [place].    HPI:  Pt is an 83 y.o.femalewith medical history significant for stage III chronic kidney disease, dyslipidemia, chronic atrial fibrillation previously on Eliquis, hypertension, seizure disorder on Keppra, and recent outpatient fine-needle aspiration of thyroid nodule. 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 She was intubated 11/25-11/27. MBS completed 11/28 showed a mdoerate-severe oropharyngeal dysphagia with delays in swallow initiation adn pharyngeal weakness. She was recommended to remain NPO  with therapeutic trials of puree. Pt went to CIR on 11/30 where she continued to work on swallow initiation but was readmitted to the hospital on 12/3 with suspected PNA.   Assessment / Plan / Recommendation Clinical Impression  Pt  has significant expressive > receptive language impairments, with no overbal output during evaluation today. She does follow one-step commands with Mod-Max cues. She answered simple yes/no questions accurately, but answered with a head nod "yes" to all questions that became mildly more complex. Using simple yes/no questions she demonstrated her orientation to self and to location, but was disoriented to time. SLP also provided Mod cues for basic problem solving and emergent awareness throughout self-care tasks. SLP will continue to follow to maximize functional communication and cognition.    SLP Assessment  SLP Recommendation/Assessment: Patient needs continued Speech Lanaguage Pathology Services SLP Visit Diagnosis: Cognitive communication deficit (R41.841)    Follow Up Recommendations  Inpatient Rehab    Frequency and Duration min 2x/week  2 weeks      SLP Evaluation Cognition  Overall Cognitive Status: Difficult to assess(aphasia) Arousal/Alertness: Awake/alert Orientation Level: Oriented to person;Oriented to place;Disoriented to time Awareness: Impaired Awareness Impairment: Emergent impairment Problem Solving: Impaired Problem Solving Impairment: Functional basic       Comprehension  Auditory Comprehension Overall Auditory Comprehension: Impaired Yes/No Questions: Impaired Basic Biographical Questions: 76-100% accurate Basic Immediate Environment Questions: 75-100% accurate Complex Questions: 50-74% accurate(answered "yes" to everything) Commands: Impaired One Step Basic Commands: 25-49% accurate    Expression Expression Primary Mode of Expression: Verbal Verbal Expression Overall Verbal Expression: Impaired(nonverbal)   Oral / Motor  Oral Motor/Sensory Function  Overall Oral Motor/Sensory Function: Moderate impairment Facial ROM: Reduced right;Suspected CN VII (facial) dysfunction Facial Symmetry: Abnormal symmetry right;Suspected CN VII (facial) dysfunction Facial  Strength: Reduced right;Suspected CN VII (facial) dysfunction Facial Sensation: Reduced right Motor Speech Overall Motor Speech: (UTA)   GO                    Germain Osgood 09/21/2017, 11:49 AM   Germain Osgood, M.A. CCC-SLP [phone]

## 2017-09-21 NOTE — Progress Notes (Signed)
PROGRESS NOTE    Kayla Kent  HGD:[id number] DOB: 09/08/1932 DOA: 09/19/2017 PCP: Biagio Borg, MD   Brief Narrative:  Kayla Kent a 83 y.o.femalewith medical history significant for stage III chronic kidney disease, dyslipidemia, chronic atrial fibrillation previously on Eliquis, hypertension, seizure disorder on Keppra, and recent outpatient fine-needle aspiration of thyroid nodule.  Patient was initially admitted on 11/20 secondary to acute upper GI bleeding Heme Positive as well as multidrug-resistant Klebsiella pneumonia.   On 11/25 patient was aphasic with respiratory distress and was later found to have an acute MCA stroke.  Her eliquis had been held due to the GI bleed.  11/25: underwent thrombolysis of left middle cerebral artery She was treated for aspiration pneumonia as well and was intubated. She was eventually discharged to CIR on 11/30 On 12/3 she was evaluated for fever and suspected to have continued Aspiration pneumonia and readmitted to the hospital. On 12/5 she underwent MBS again and failed and will continue TF via Cortrack.   Assessment & Plan:   Active Problems:   Acute respiratory failure with hypoxia (HCC)  Acute respiratory failure with hypoxia (HCC) 2/2 to Aspiration PNA - leukocytosis - possible Pneumonia again- low grade fever yesterday with possible Hypoxia- now is 100% on 2 L O2 - apparently completed 5 days of antibiotics on 11/25 - on last admission: sputum and blood cultures were negative & resp virus panel was negative - WBC improved  - Blood Cx x2 show NGTD  - now on Levaquin- will continue to follow - Repeat CXR showed Right upper lobe pneumonia. Probable left lower lobe pneumonia andright infrahilar atelectasis. When compared to yesterday's study there has not been dramatic interval change. Thoracic aortic atherosclerosis.  Recent CVA with Dysphagia - will return to CIR when stable - has been on IV Heparin at CIR- continue  - cont  tube feeds through Cortrack for now - Repeat MBS showed that patient did not pass   A-fib/ flutter - not on rate controlling agent- cont Heparin for now and restart Eliquis when Appropriate    HTN (hypertension) -Current blood pressure suboptimal -Hold previous clonidine -Can utilize hydralazine IV prn for episodic hypertension  CKD (chronic kidney disease), stage III  -Renal function stable and at baseline  Seizure disorder - cont IV Keppra  GI bleed? -EGD was normal on 11/23  Thyroid nodule -Recent FNA of thyroid nodule with indeterminate pathology; testing ordered by PCP and patient can follow-up with this physician to discuss significance of findings    Dyslipidemia -Statin on hold until can reintroduce tube feedings  DVT prophylaxis: Anticoagulated with Heparin gtt Code Status: FULL CODE Family Communication: Discussed with son at bedside Disposition Plan: CIR when Medically Stable for D/C  Consultants:   None  Procedures:  ECHOCARDIOGRAM Study Conclusions  - Left ventricle: The cavity size was normal. Wall thickness was   increased in a pattern of mild LVH. Systolic function was normal.   The estimated ejection fraction was in the range of 60% to 65%.   Wall motion was normal; there were no regional wall motion   abnormalities. - Mitral valve: There was mild regurgitation. - Left atrium: The atrium was severely dilated. - Right atrium: The atrium was mildly dilated. - Pulmonary arteries: Systolic pressure was mildly increased. PA   peak pressure: 33 mm Hg (S).   Antimicrobials:  Anti-infectives (From admission, onward)   Start     Dose/Rate Route Frequency Ordered Stop   09/21/17 2200  levofloxacin (LEVAQUIN)  IVPB 750 mg     750 mg 100 mL/hr over 90 Minutes Intravenous Every 48 hours 09/20/17 1030     09/20/17 0500  levofloxacin (LEVAQUIN) IVPB 500 mg  Status:  Discontinued     500 mg 100 mL/hr over 60 Minutes Intravenous Every 24 hours 09/19/17  1443 09/20/17 1030   09/19/17 1345  levofloxacin (LEVAQUIN) IVPB 750 mg  Status:  Discontinued     750 mg 100 mL/hr over 90 Minutes Intravenous Every 24 hours 09/19/17 1339 09/19/17 1444     Subjective: Seen and examined at beside and was aphasic. Would shake head yes and no and denied any pain. No CP or SOB.   Objective: Vitals:   09/21/17 0400 09/21/17 0500 09/21/17 0600 09/21/17 0700  BP: (!) 107/37 (!) 129/50 (!) 133/46 (!) 118/40  Pulse: 73 87 87 90  Resp: 17 19 (!) 21 18  Temp:      TempSrc:      SpO2: 96% 97% 96% 97%  Weight:        Intake/Output Summary (Last 24 hours) at 09/21/2017 0802 Last data filed at 09/21/2017 0600 Gross per 24 hour  Intake 1771.6 ml  Output 425 ml  Net 1346.6 ml   Filed Weights   09/19/17 1340 09/20/17 0427 09/21/17 0336  Weight: 46.5 kg (102 lb 8.2 oz) 48 kg (105 lb 13.1 oz) 47.5 kg (104 lb 11.5 oz)    Examination: Physical Exam:  Constitutional: Thin Frail Cachectic Caucasian female in NAD and appears calm  Eyes: Lids and conjunctivae normal, sclerae anicteric  ENMT: External Ears normal. Cortrak in place.  Grossly normal hearing. Mucous membranes are moist.  Neck: Appears normal, supple, no cervical masses, normal ROM, no appreciable thyromegaly, no JVD Respiratory: Diminished to auscultation bilaterally, no wheezing, rales, rhonchi or crackles. Normal respiratory effort and patient is not tachypenic. No accessory muscle use.  Cardiovascular: RRR, no murmurs / rubs / gallops. S1 and S2 auscultated. No extremity edema.  Abdomen: Soft, non-tender, non-distended. No masses palpated. No appreciable hepatosplenomegaly. Bowel sounds positive.  GU: Deferred. Musculoskeletal: No clubbing / cyanosis of digits/nails. No joint deformity upper and lower extremities. Skin: No rashes, lesions, ulcers on a limited skin eval. No induration; Warm and dry.  Neurologic: Aphasic. Right sided weakness.   Psychiatric: Normal judgment and insight. Awake and  alert.  Data Reviewed: I have personally reviewed following labs and imaging studies  CBC: Recent Labs  Lab 09/15/17 0608 09/16/17 0430 09/17/17 0207 09/18/17 0448 09/19/17 0138 09/20/17 0137 09/21/17 0420  WBC 11.0* 10.4 15.7* 16.4* 14.6* 16.7* 9.9  NEUTROABS 9.1* 8.0* 12.9*  --  11.4*  --   --   HGB 10.3* 9.6* 9.7* 9.5* 9.1* 9.4* 8.8*  HCT 31.8* 30.0* 30.3* 30.1* 28.4* 29.3* 27.7*  MCV 93.3 94.0 94.4 95.6 95.0 94.5 95.8  PLT 391 417* 420* 451* 417* 434* 948*   Basic Metabolic Panel: Recent Labs  Lab 09/14/17 1622 09/15/17 0608 09/15/17 1626 09/16/17 0430 09/17/17 0207 09/19/17 0407  NA  --   --   --  142 140 137  K  --   --   --  4.3 4.2 4.2  CL  --   --   --  110 109 103  CO2  --   --   --  28 25 26   GLUCOSE  --   --   --  138* 118* 109*  BUN  --   --   --  27* 25* 19  CREATININE  --   --   --  0.57 0.60 0.72  CALCIUM  --   --   --  7.7* 7.6* 8.1*  MG 2.1 2.2 2.2 2.4 2.1  --   PHOS 2.6 2.6 2.2* 2.2* 2.2*  --    GFR: Estimated Creatinine Clearance: 40 mL/min (by C-G formula based on SCr of 0.72 mg/dL). Liver Function Tests: Recent Labs  Lab 09/17/17 0207  AST 59*  ALT 70*  ALKPHOS 92  BILITOT 0.4  PROT 4.9*  ALBUMIN 2.0*   No results for input(s): LIPASE, AMYLASE in the last 168 hours. No results for input(s): AMMONIA in the last 168 hours. Coagulation Profile: No results for input(s): INR, PROTIME in the last 168 hours. Cardiac Enzymes: Recent Labs  Lab 09/19/17 0743 09/19/17 1245 09/19/17 1418 09/19/17 1922 09/20/17 0137  TROPONINI 0.04* 0.03* 0.04* 0.04* 0.04*   BNP (last 3 results) Recent Labs    10/22/16 1135  PROBNP 410.0*   HbA1C: No results for input(s): HGBA1C in the last 72 hours. CBG: Recent Labs  Lab 09/20/17 0358 09/20/17 0905 09/20/17 1651 09/20/17 2025 09/21/17 0340  GLUCAP 107* 89 166* 139* 141*   Lipid Profile: No results for input(s): CHOL, HDL, LDLCALC, TRIG, CHOLHDL, LDLDIRECT in the last 72 hours. Thyroid  Function Tests: No results for input(s): TSH, T4TOTAL, FREET4, T3FREE, THYROIDAB in the last 72 hours. Anemia Panel: No results for input(s): VITAMINB12, FOLATE, FERRITIN, TIBC, IRON, RETICCTPCT in the last 72 hours. Sepsis Labs: Recent Labs  Lab 09/19/17 0407 09/19/17 0743  PROCALCITON  --  0.13  LATICACIDVEN 1.6 1.3    Recent Results (from the past 240 hour(s))  Culture, blood (routine x 2)     Status: None   Collection Time: 09/11/17  8:32 AM  Result Value Ref Range Status   Specimen Description BLOOD RIGHT ANTECUBITAL  Final   Special Requests   Final    BOTTLES DRAWN AEROBIC AND ANAEROBIC Blood Culture adequate volume   Culture NO GROWTH 5 DAYS  Final   Report Status 09/16/2017 FINAL  Final  Culture, blood (routine x 2)     Status: None   Collection Time: 09/11/17  8:33 AM  Result Value Ref Range Status   Specimen Description BLOOD LEFT HAND  Final   Special Requests   Final    BOTTLES DRAWN AEROBIC AND ANAEROBIC Blood Culture results may not be optimal due to an excessive volume of blood received in culture bottles   Culture NO GROWTH 5 DAYS  Final   Report Status 09/16/2017 FINAL  Final  Culture, Urine     Status: None   Collection Time: 09/11/17 11:00 AM  Result Value Ref Range Status   Specimen Description URINE, CLEAN CATCH  Final   Special Requests NONE  Final   Culture NO GROWTH  Final   Report Status 09/12/2017 FINAL  Final  Respiratory Panel by PCR     Status: None   Collection Time: 09/11/17  2:15 PM  Result Value Ref Range Status   Adenovirus NOT DETECTED NOT DETECTED Final   Coronavirus 229E NOT DETECTED NOT DETECTED Final   Coronavirus HKU1 NOT DETECTED NOT DETECTED Final   Coronavirus NL63 NOT DETECTED NOT DETECTED Final   Coronavirus OC43 NOT DETECTED NOT DETECTED Final   Metapneumovirus NOT DETECTED NOT DETECTED Final   Rhinovirus / Enterovirus NOT DETECTED NOT DETECTED Final   Influenza A NOT DETECTED NOT DETECTED Final   Influenza B NOT DETECTED  NOT DETECTED Final   Parainfluenza Virus 1 NOT DETECTED NOT DETECTED Final  Parainfluenza Virus 2 NOT DETECTED NOT DETECTED Final   Parainfluenza Virus 3 NOT DETECTED NOT DETECTED Final   Parainfluenza Virus 4 NOT DETECTED NOT DETECTED Final   Respiratory Syncytial Virus NOT DETECTED NOT DETECTED Final   Bordetella pertussis NOT DETECTED NOT DETECTED Final   Chlamydophila pneumoniae NOT DETECTED NOT DETECTED Final   Mycoplasma pneumoniae NOT DETECTED NOT DETECTED Final  Culture, respiratory (NON-Expectorated)     Status: None   Collection Time: 09/11/17 11:44 PM  Result Value Ref Range Status   Specimen Description TRACHEAL ASPIRATE  Final   Special Requests NONE  Final   Gram Stain   Final    MODERATE WBC PRESENT, PREDOMINANTLY MONONUCLEAR NO SQUAMOUS EPITHELIAL CELLS SEEN NO ORGANISMS SEEN    Culture NO GROWTH 2 DAYS  Final   Report Status 09/14/2017 FINAL  Final  Culture, blood (Routine X 2) w Reflex to ID Panel     Status: None (Preliminary result)   Collection Time: 09/19/17  7:35 AM  Result Value Ref Range Status   Specimen Description BLOOD LEFT HAND  Final   Special Requests IN PEDIATRIC BOTTLE Blood Culture adequate volume  Final   Culture NO GROWTH 1 DAY  Final   Report Status PENDING  Incomplete  Culture, blood (Routine X 2) w Reflex to ID Panel     Status: None (Preliminary result)   Collection Time: 09/19/17  7:40 AM  Result Value Ref Range Status   Specimen Description BLOOD LEFT HAND  Final   Special Requests IN PEDIATRIC BOTTLE Blood Culture adequate volume  Final   Culture NO GROWTH 1 DAY  Final   Report Status PENDING  Incomplete  Respiratory Panel by PCR     Status: None   Collection Time: 09/19/17  2:17 PM  Result Value Ref Range Status   Adenovirus NOT DETECTED NOT DETECTED Final   Coronavirus 229E NOT DETECTED NOT DETECTED Final   Coronavirus HKU1 NOT DETECTED NOT DETECTED Final   Coronavirus NL63 NOT DETECTED NOT DETECTED Final   Coronavirus OC43 NOT  DETECTED NOT DETECTED Final   Metapneumovirus NOT DETECTED NOT DETECTED Final   Rhinovirus / Enterovirus NOT DETECTED NOT DETECTED Final   Influenza A NOT DETECTED NOT DETECTED Final   Influenza B NOT DETECTED NOT DETECTED Final   Parainfluenza Virus 1 NOT DETECTED NOT DETECTED Final   Parainfluenza Virus 2 NOT DETECTED NOT DETECTED Final   Parainfluenza Virus 3 NOT DETECTED NOT DETECTED Final   Parainfluenza Virus 4 NOT DETECTED NOT DETECTED Final   Respiratory Syncytial Virus NOT DETECTED NOT DETECTED Final   Bordetella pertussis NOT DETECTED NOT DETECTED Final   Chlamydophila pneumoniae NOT DETECTED NOT DETECTED Final   Mycoplasma pneumoniae NOT DETECTED NOT DETECTED Final  Culture, Urine     Status: None   Collection Time: 09/19/17  5:21 PM  Result Value Ref Range Status   Specimen Description URINE, RANDOM  Final   Special Requests NONE  Final   Culture NO GROWTH  Final   Report Status 09/20/2017 FINAL  Final    Radiology Studies: Dg Chest 2 View  Result Date: 09/20/2017 CLINICAL DATA:  Acute respiratory failure, hypoxia, syncope EXAM: CHEST  2 VIEW COMPARISON:  Portable chest x-ray of 12 3 and 09/17/2017 FINDINGS: Opacities at both lung bases and in the right upper lobe persists mild consistent with multifocal pneumonia. Small effusions layer posteriorly. There does appear to have been some improvement in mild pulmonary vascular congestion. Cardiomegaly is stable. NG tube extends below  the hemidiaphragm. IMPRESSION: 1. Persistent bibasilar opacities as well as right upper lobe opacity most consistent with multifocal pneumonia with small pleural effusions. 2. Some improvement in mild pulmonary vascular congestion. Electronically Signed   By: Ivar Drape M.D.   On: 09/20/2017 08:43   Scheduled Meds: . aspirin  300 mg Rectal Daily  . free water  200 mL Per Tube Q8H  . ipratropium-albuterol  3 mL Nebulization Q6H  . levETIRAcetam  250 mg Per Tube BID  . mouth rinse  15 mL Mouth Rinse  BID  . pantoprazole (PROTONIX) IV  40 mg Intravenous Q12H   Continuous Infusions: . feeding supplement (OSMOLITE 1.2 CAL) 1,000 mL (09/21/17 0330)  . heparin 950 Units/hr (09/21/17 0544)  . levofloxacin (LEVAQUIN) IV      LOS: 2 days   Kerney Elbe, DO [facility] Pager [phone]  If 7PM-7AM, please contact night-coverage [email or web address] Password [email or web address] 09/21/2017, 8:02 AM

## 2017-09-21 NOTE — Progress Notes (Deleted)
OT Cancellation Note  Patient Details Name: Kayla Kent MRN: [id number] DOB: 28-Apr-1932   Cancelled Treatment:    Reason Eval/Treat Not Completed: Medical issues which prohibited therapy. Per RN Pt currently in respiratory distress. OT will continue to follow for eval as Pt is more medically appropriate.   [place] 09/21/2017, 10:02 AM  Hulda Humphrey OTR/L [phone]

## 2017-09-22 DIAGNOSIS — F419 Anxiety disorder, unspecified: Secondary | ICD-10-CM

## 2017-09-22 DIAGNOSIS — I10 Essential (primary) hypertension: Secondary | ICD-10-CM

## 2017-09-22 DIAGNOSIS — I6330 Cerebral infarction due to thrombosis of unspecified cerebral artery: Secondary | ICD-10-CM

## 2017-09-22 LAB — CBC WITH DIFFERENTIAL/PLATELET
BASOS ABS: 0 10*3/uL (ref 0.0–0.1)
Basophils Relative: 0 %
Eosinophils Absolute: 0.2 10*3/uL (ref 0.0–0.7)
Eosinophils Relative: 1 %
HCT: 29.7 % — ABNORMAL LOW (ref 36.0–46.0)
HEMOGLOBIN: 9.5 g/dL — AB (ref 12.0–15.0)
LYMPHS ABS: 1 10*3/uL (ref 0.7–4.0)
LYMPHS PCT: 8 %
MCH: 30.8 pg (ref 26.0–34.0)
MCHC: 32 g/dL (ref 30.0–36.0)
MCV: 96.4 fL (ref 78.0–100.0)
Monocytes Absolute: 1.4 10*3/uL — ABNORMAL HIGH (ref 0.1–1.0)
Monocytes Relative: 11 %
NEUTROS ABS: 10.5 10*3/uL — AB (ref 1.7–7.7)
NEUTROS PCT: 80 %
Platelets: 448 10*3/uL — ABNORMAL HIGH (ref 150–400)
RBC: 3.08 MIL/uL — AB (ref 3.87–5.11)
RDW: 16.7 % — ABNORMAL HIGH (ref 11.5–15.5)
WBC: 13.1 10*3/uL — AB (ref 4.0–10.5)

## 2017-09-22 LAB — COMPREHENSIVE METABOLIC PANEL
ALK PHOS: 85 U/L (ref 38–126)
ALT: 41 U/L (ref 14–54)
AST: 29 U/L (ref 15–41)
Albumin: 2.1 g/dL — ABNORMAL LOW (ref 3.5–5.0)
Anion gap: 9 (ref 5–15)
BUN: 16 mg/dL (ref 6–20)
CALCIUM: 8.1 mg/dL — AB (ref 8.9–10.3)
CHLORIDE: 103 mmol/L (ref 101–111)
CO2: 25 mmol/L (ref 22–32)
CREATININE: 0.65 mg/dL (ref 0.44–1.00)
Glucose, Bld: 127 mg/dL — ABNORMAL HIGH (ref 65–99)
Potassium: 4 mmol/L (ref 3.5–5.1)
SODIUM: 137 mmol/L (ref 135–145)
Total Bilirubin: 0.5 mg/dL (ref 0.3–1.2)
Total Protein: 5.1 g/dL — ABNORMAL LOW (ref 6.5–8.1)

## 2017-09-22 LAB — GLUCOSE, CAPILLARY
GLUCOSE-CAPILLARY: 105 mg/dL — AB (ref 65–99)
GLUCOSE-CAPILLARY: 111 mg/dL — AB (ref 65–99)
GLUCOSE-CAPILLARY: 116 mg/dL — AB (ref 65–99)
GLUCOSE-CAPILLARY: 119 mg/dL — AB (ref 65–99)
GLUCOSE-CAPILLARY: 120 mg/dL — AB (ref 65–99)
Glucose-Capillary: 112 mg/dL — ABNORMAL HIGH (ref 65–99)

## 2017-09-22 LAB — PHOSPHORUS: PHOSPHORUS: 2.7 mg/dL (ref 2.5–4.6)

## 2017-09-22 LAB — HEPARIN LEVEL (UNFRACTIONATED)
HEPARIN UNFRACTIONATED: 0.6 [IU]/mL (ref 0.30–0.70)
Heparin Unfractionated: 0.44 IU/mL (ref 0.30–0.70)

## 2017-09-22 LAB — MAGNESIUM: Magnesium: 2 mg/dL (ref 1.7–2.4)

## 2017-09-22 MED ORDER — BUDESONIDE 0.25 MG/2ML IN SUSP
0.2500 mg | Freq: Two times a day (BID) | RESPIRATORY_TRACT | Status: DC
  Administered 2017-09-22 – 2017-09-28 (×13): 0.25 mg via RESPIRATORY_TRACT
  Filled 2017-09-22 (×13): qty 2

## 2017-09-22 MED ORDER — ORAL CARE MOUTH RINSE: 15.0000 mL | Freq: Two times a day (BID) | OROMUCOSAL | Status: DC

## 2017-09-22 MED ORDER — CHLORHEXIDINE GLUCONATE 0.12 % MT SOLN
15.0000 mL | Freq: Two times a day (BID) | OROMUCOSAL | Status: DC
  Administered 2017-09-22 – 2017-09-28 (×12): 15 mL via OROMUCOSAL
  Filled 2017-09-22 (×7): qty 15

## 2017-09-22 NOTE — Progress Notes (Addendum)
PROGRESS NOTE    Kayla Kent  YQM:[id number] DOB: [birth year] DOA: 09/19/2017 PCP: Biagio Borg, MD   Brief Narrative:  Kayla Kent a 83 y.o.femalewith medical history significant for stage III chronic kidney disease, dyslipidemia, chronic atrial fibrillation previously on Eliquis, hypertension, seizure disorder on Keppra, and recent outpatient fine-needle aspiration of thyroid nodule.  Patient was initially admitted on 11/20 secondary to acute upper GI bleeding Heme Positive as well as multidrug-resistant Klebsiella pneumonia.   On 11/25 patient was aphasic with respiratory distress and was later found to have an acute MCA stroke.  Her eliquis had been held due to the GI bleed.  11/25: underwent thrombolysis of left middle cerebral artery She was treated for aspiration pneumonia as well and was intubated. She was eventually discharged to CIR on 11/30 On 12/3 she was evaluated for fever and suspected to have continued Aspiration pneumonia and readmitted to the hospital. On 12/5 she underwent MBS again and failed and will continue TF via Cortrack.  12/6 IR Consulted for PEG Placement. Awaiting to be done.   Assessment & Plan:   Active Problems:   Anxiety   Essential hypertension   Atrial fibrillation with RVR (HCC)   Acute renal failure (HCC)   Gastroesophageal reflux disease   CKD (chronic kidney disease) stage 3, GFR 30-59 ml/min (HCC)   Chronic atrial fibrillation (HCC)   Dyslipidemia   COPD (chronic obstructive pulmonary disease) (HCC)   Acute hypoxemic respiratory failure (HCC)   Cerebral thrombosis with cerebral infarction   Aspiration pneumonia due to gastric secretions (HCC)   Acute ischemic left MCA stroke (HCC)   Acute respiratory failure with hypoxia (HCC)   Dysphagia  Acute respiratory failure with hypoxia (HCC) 2/2 to Aspiration PNA - Leukocytosis went from 14.6 -> 16.7 -> 9.9 -> 13.1 - Possible Aspiration Pneumonia again- low grade fever yesterday with  possible Hypoxia- now is 100% on 2 L O2 - Apparently completed 5 days of antibiotics on 11/25 - On last admission: sputum and blood cultures were negative & resp virus panel was Negative - Respiratory Virus Panel Negative - WBC improved yesterday but rose slightly to 13.1 - Blood Cx x2 show NGTD at 3 Days - Now on Levaquin- will continue to follow - Repeat CXR 12/4 showed Right upper lobe pneumonia. Probable left lower lobe pneumonia andright infrahilar atelectasis. When compared to yesterday's study there has not been dramatic interval change. Thoracic aortic atherosclerosis. - Repeat CXR in AM   Recent CVA with Dysphagia - will return to CIR when stable - has been on IV Heparin at CIR- continue for now  - cont tube feeds through Cortrack for now - Repeat MBS showed that patient did not pass  - IR consulted for PEG Placement. Patient and Family agreeable   A-fib/ Flutter - not on rate controlling agent- cont Heparin for now and restart Eliquis when Appropriate through PEG Tube   HTN (hypertension) -Current blood pressure suboptimal -Hold previous clonidine -Can utilize hydralazine IV prn for episodic hypertension  CKD (chronic kidney disease), stage III  -Renal function stable and at baseline  Seizure Disorder -Cont IV Keppra for Now until able to through PEB  GI bleed? -EGD was normal on 11/23  Thyroid nodule -Recent FNA of thyroid nodule with indeterminate pathology; testing ordered by PCP and patient can follow-up with this physician to discuss significance of findings  Dyslipidemia -Statin on hold until can reintroduce tube feedings through PEG  Leukocytosis -WBC went from 9.9 -> 13.1 -Likely  reactive -Patient on IV Abx for PNA -Repeat CBC in AM  Thrombocytosis -Likely Reactive -Platelet Count went from 412 -> 448 -Continue to Monitor and Repeat CBC in AM   DVT prophylaxis: Anticoagulated with Heparin gtt Code Status: Partial CODE; OK with Intubation no  Shocks or Compressions Family Communication: Discussed with daughter over the phone Disposition Plan: CIR when Medically Stable for D/C  Consultants:   Interventional Radiology   Procedures:  ECHOCARDIOGRAM Study Conclusions  - Left ventricle: The cavity size was normal. Wall thickness was   increased in a pattern of mild LVH. Systolic function was normal.   The estimated ejection fraction was in the range of 60% to 65%.   Wall motion was normal; there were no regional wall motion   abnormalities. - Mitral valve: There was mild regurgitation. - Left atrium: The atrium was severely dilated. - Right atrium: The atrium was mildly dilated. - Pulmonary arteries: Systolic pressure was mildly increased. PA   peak pressure: 33 mm Hg (S).   Antimicrobials:  Anti-infectives (From admission, onward)   Start     Dose/Rate Route Frequency Ordered Stop   09/21/17 2200  levofloxacin (LEVAQUIN) IVPB 750 mg     750 mg 100 mL/hr over 90 Minutes Intravenous Every 48 hours 09/20/17 1030     09/20/17 0500  levofloxacin (LEVAQUIN) IVPB 500 mg  Status:  Discontinued     500 mg 100 mL/hr over 60 Minutes Intravenous Every 24 hours 09/19/17 1443 09/20/17 1030   09/19/17 1345  levofloxacin (LEVAQUIN) IVPB 750 mg  Status:  Discontinued     750 mg 100 mL/hr over 90 Minutes Intravenous Every 24 hours 09/19/17 1339 09/19/17 1444     Subjective: Seen and examined at beside and aphasic but fully able to comprehend and answers yes and no. Wanted to be Partial Code. Ok to get PEG tube. No Pain. Had some gurgling and wanted to be suctioned.   Objective: Vitals:   09/22/17 1100 09/22/17 1204 09/22/17 1452 09/22/17 1520  BP: (!) 129/54 138/65  138/66  Pulse: 87 86  93  Resp: (!) 21 19  (!) 21  Temp:  (!) 97.5 F (36.4 C)  (!) 97.4 F (36.3 C)  TempSrc:  Oral  Oral  SpO2: 98% 98% 100% 99%  Weight:        Intake/Output Summary (Last 24 hours) at 09/22/2017 1756 Last data filed at 09/22/2017  1500 Gross per 24 hour  Intake 2417 ml  Output 1750 ml  Net 667 ml   Filed Weights   09/20/17 0427 09/21/17 0336 09/22/17 0407  Weight: 48 kg (105 lb 13.1 oz) 47.5 kg (104 lb 11.5 oz) 47.8 kg (105 lb 6.1 oz)   Examination: Physical Exam:  Constitutional: Thin Frail Cachectic Caucasian female in NAD Eyes: Sclerae anicteric. Lids normal ENMT: External ears normal. Cortrak in place. MMM Neck: Supple with no JVD Respiratory: Diminished to ausculation with rhonchi and diffuse crackles. No appreciable wheezing. Unlabored breathing but sounded congested Cardiovascular: Slightly tachycardic rate and irregular rhythm. No extremity edema Abdomen: Soft, NT, ND. Bowel sounds present. No appreciable masses GU: Deferred Musculoskeletal: No contractures. No cyanosis Skin: No rashes or lesions on a limited skin eval. Warm and dry Neurologic: Aphasic. Right sided weakness especially Right arm improving slightly Psychiatric: Awake and Alert. Normal mood and affect  Data Reviewed: I have personally reviewed following labs and imaging studies  CBC: Recent Labs  Lab 09/16/17 0430 09/17/17 0207 09/18/17 0448 09/19/17 0138 09/20/17 5784 09/21/17 6962  09/22/17 0652  WBC 10.4 15.7* 16.4* 14.6* 16.7* 9.9 13.1*  NEUTROABS 8.0* 12.9*  --  11.4*  --   --  10.5*  HGB 9.6* 9.7* 9.5* 9.1* 9.4* 8.8* 9.5*  HCT 30.0* 30.3* 30.1* 28.4* 29.3* 27.7* 29.7*  MCV 94.0 94.4 95.6 95.0 94.5 95.8 96.4  PLT 417* 420* 451* 417* 434* 412* 440*   Basic Metabolic Panel: Recent Labs  Lab 09/16/17 0430 09/17/17 0207 09/19/17 0407 09/21/17 0854 09/22/17 0652  NA 142 140 137 136 137  K 4.3 4.2 4.2 4.0 4.0  CL 110 109 103 100* 103  CO2 28 25 26 26 25   GLUCOSE 138* 118* 109* 144* 127*  BUN 27* 25* 19 16 16   CREATININE 0.57 0.60 0.72 0.66 0.65  CALCIUM 7.7* 7.6* 8.1* 7.7* 8.1*  MG 2.4 2.1  --  2.0 2.0  PHOS 2.2* 2.2*  --  3.1 2.7   GFR: Estimated Creatinine Clearance: 40.2 mL/min (by C-G formula based on SCr  of 0.65 mg/dL). Liver Function Tests: Recent Labs  Lab 09/17/17 0207 09/21/17 0854 09/22/17 0652  AST 59* 36 29  ALT 70* 50 41  ALKPHOS 92 95 85  BILITOT 0.4 0.5 0.5  PROT 4.9* 5.2* 5.1*  ALBUMIN 2.0* 2.1* 2.1*   No results for input(s): LIPASE, AMYLASE in the last 168 hours. No results for input(s): AMMONIA in the last 168 hours. Coagulation Profile: No results for input(s): INR, PROTIME in the last 168 hours. Cardiac Enzymes: Recent Labs  Lab 09/19/17 0743 09/19/17 1245 09/19/17 1418 09/19/17 1922 09/20/17 0137  TROPONINI 0.04* 0.03* 0.04* 0.04* 0.04*   BNP (last 3 results) Recent Labs    10/22/16 1135  PROBNP 410.0*   HbA1C: No results for input(s): HGBA1C in the last 72 hours. CBG: Recent Labs  Lab 09/21/17 1641 09/22/17 0406 09/22/17 0826 09/22/17 1240 09/22/17 1535  GLUCAP 123* 120* 112* 111* 105*   Lipid Profile: No results for input(s): CHOL, HDL, LDLCALC, TRIG, CHOLHDL, LDLDIRECT in the last 72 hours. Thyroid Function Tests: No results for input(s): TSH, T4TOTAL, FREET4, T3FREE, THYROIDAB in the last 72 hours. Anemia Panel: No results for input(s): VITAMINB12, FOLATE, FERRITIN, TIBC, IRON, RETICCTPCT in the last 72 hours. Sepsis Labs: Recent Labs  Lab 09/19/17 0407 09/19/17 0743  PROCALCITON  --  0.13  LATICACIDVEN 1.6 1.3    Recent Results (from the past 240 hour(s))  Culture, blood (Routine X 2) w Reflex to ID Panel     Status: None (Preliminary result)   Collection Time: 09/19/17  7:35 AM  Result Value Ref Range Status   Specimen Description BLOOD LEFT HAND  Final   Special Requests IN PEDIATRIC BOTTLE Blood Culture adequate volume  Final   Culture NO GROWTH 3 DAYS  Final   Report Status PENDING  Incomplete  Culture, blood (Routine X 2) w Reflex to ID Panel     Status: None (Preliminary result)   Collection Time: 09/19/17  7:40 AM  Result Value Ref Range Status   Specimen Description BLOOD LEFT HAND  Final   Special Requests IN  PEDIATRIC BOTTLE Blood Culture adequate volume  Final   Culture NO GROWTH 3 DAYS  Final   Report Status PENDING  Incomplete  Respiratory Panel by PCR     Status: None   Collection Time: 09/19/17  2:17 PM  Result Value Ref Range Status   Adenovirus NOT DETECTED NOT DETECTED Final   Coronavirus 229E NOT DETECTED NOT DETECTED Final   Coronavirus HKU1 NOT DETECTED  NOT DETECTED Final   Coronavirus NL63 NOT DETECTED NOT DETECTED Final   Coronavirus OC43 NOT DETECTED NOT DETECTED Final   Metapneumovirus NOT DETECTED NOT DETECTED Final   Rhinovirus / Enterovirus NOT DETECTED NOT DETECTED Final   Influenza A NOT DETECTED NOT DETECTED Final   Influenza B NOT DETECTED NOT DETECTED Final   Parainfluenza Virus 1 NOT DETECTED NOT DETECTED Final   Parainfluenza Virus 2 NOT DETECTED NOT DETECTED Final   Parainfluenza Virus 3 NOT DETECTED NOT DETECTED Final   Parainfluenza Virus 4 NOT DETECTED NOT DETECTED Final   Respiratory Syncytial Virus NOT DETECTED NOT DETECTED Final   Bordetella pertussis NOT DETECTED NOT DETECTED Final   Chlamydophila pneumoniae NOT DETECTED NOT DETECTED Final   Mycoplasma pneumoniae NOT DETECTED NOT DETECTED Final  Culture, Urine     Status: None   Collection Time: 09/19/17  5:21 PM  Result Value Ref Range Status   Specimen Description URINE, RANDOM  Final   Special Requests NONE  Final   Culture NO GROWTH  Final   Report Status 09/20/2017 FINAL  Final    Radiology Studies: Dg Chest 1 View  Result Date: 09/21/2017 CLINICAL DATA:  Shortness of breath and chest congestion. Images were obtained following a swallowing function radiograph. EXAM: CHEST 1 VIEW COMPARISON:  PA and lateral chest x-ray of September 20, 2017 FINDINGS: There is confluent airspace opacity in the inferior aspect of the right upper lobe. This has not appreciably changed since yesterday's study. The lung markings remain coarse in the retrocardiac region on the left with obscuration of the hemidiaphragm. The  right infrahilar lung markings are mildly increased. There small bilateral pleural effusions greatest on the left. The heart is top-normal in size. The pulmonary vascularity is not engorged. The feeding tube tip projects below the inferior margin of the image. There is calcification in the wall of the thoracic aorta. IMPRESSION: Right upper lobe pneumonia. Probable left lower lobe pneumonia and right infrahilar atelectasis. When compared to yesterday's study there has not been dramatic interval change. Thoracic aortic atherosclerosis. Electronically Signed   By: David  Martinique M.D.   On: 09/21/2017 14:24   Dg Swallowing Func-speech Pathology  Result Date: 09/21/2017 Objective Swallowing Evaluation: Type of Study: MBS-Modified Barium Swallow Study  Patient Details Name: Kayla Kent MRN: [id number] Date of Birth: 1932-07-02 Today's Date: 09/21/2017 Time: SLP Start Time (ACUTE ONLY): 2951 -SLP Stop Time (ACUTE ONLY): 1403 SLP Time Calculation (min) (ACUTE ONLY): 16 min Past Medical History: Past Medical History: Diagnosis Date . Allergic rhinitis  . Anxiety  . CKD (chronic kidney disease) stage 3, GFR 30-59 ml/min (HCC)  . Emphysema of lung ([place])  . HLD (hyperlipidemia)  . HTN (hypertension)  . Intolerance of drug   orthostatic . Paroxysmal supraventricular tachycardia ([place])  . PVD (peripheral vascular disease) ([place])  . Seizure ([place])  . Syncope and collapse  . Uterine prolapse without mention of vaginal wall prolapse  Past Surgical History: Past Surgical History: Procedure Laterality Date . CARDIOVERSION N/A 11/05/2014  Procedure: CARDIOVERSION;  Surgeon: Candee Furbish, MD;  Location[facility] ENDOSCOPY;  Service: Cardiovascular;  Laterality: N/A; . cataract surgery  08/2015 . CHOLECYSTECTOMY   . corrective eye surgery    as a child . ESOPHAGOGASTRODUODENOSCOPY N/A 09/09/2017  Procedure: ESOPHAGOGASTRODUODENOSCOPY (EGD);  Surgeon: Mauri Pole, MD;  Location: [facility] ENDOSCOPY;  Service: Endoscopy;  Laterality: N/A; .  INTRAMEDULLARY (IM) NAIL INTERTROCHANTERIC Left 11/21/2016  Procedure: INTRAMEDULLARY (IM) NAIL INTERTROCHANTRIC HEMI;  Surgeon: Newt Minion, MD;  Location: [place];  Service: Orthopedics;  Laterality: Left; . IR PERCUTANEOUS ART THROMBECTOMY/INFUSION INTRACRANIAL INC DIAG ANGIO  09/11/2017 . IVD removed   . OPEN REDUCTION INTERNAL FIXATION (ORIF) DISTAL RADIAL FRACTURE Right 08/29/2014  Procedure: OPEN REDUCTION INTERNAL FIXATION (ORIF) DISTAL RADIAL FRACTURE;  Surgeon: Marianna Payment, MD;  Location: [place];  Service: Orthopedics;  Laterality: Right; . RADIOLOGY WITH ANESTHESIA N/A 09/11/2017  Procedure: RADIOLOGY WITH ANESTHESIA;  Surgeon: Luanne Bras, MD;  Location: [place];  Service: Radiology;  Laterality: N/A; . RF ablation PSVT    summer '10 . stress cardiolite  08/05/93 . TONSILLECTOMY   . TOTAL HIP ARTHROPLASTY Right 08/29/2014  Procedure: Right Hip Hemi Arthroplasty;  Surgeon: Marianna Payment, MD;  Location: [place];  Service: Orthopedics;  Laterality: Right;  Hip procedure 1st wants Peg Board, [employer], [place].  HPI: Pt is an 83 y.o.femalewith medical history significant for stage III chronic kidney disease, dyslipidemia, chronic atrial fibrillation previously on Eliquis, hypertension, seizure disorder on Keppra, and recent outpatient fine-needle aspiration of thyroid nodule. 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 She was intubated 11/25-11/27. MBS completed 11/28 showed a mdoerate-severe oropharyngeal dysphagia with delays in swallow initiation adn pharyngeal weakness. She was recommended to remain NPO  with therapeutic trials of puree. Pt went to CIR on 11/30 where she continued to work on swallow initiation but was readmitted to the hospital on 12/3 with suspected PNA.  Subjective: pt is alert today Assessment / Plan / Recommendation CHL IP CLINICAL IMPRESSIONS 09/21/2017  Clinical Impression Pt continues to present with a moderate-severe oropharyngeal dysphagia. She still has oral holding, right-sided weakness that allows for anterior spillage and incomplete clearance, and delayed swallow initiation to the pyriform sinuses. She has sensed aspiration with honey thick liquids during the swallow that cannot be sufficiently cleared, and moderate pharyngeal residue that is repeatedly penetrated into the airway as pt makes attempts to clear it (both cued and spontaneously) before it falls back in. Pureed solids are penetrated into the airway during the swallow and after, in a similar cyclical manner. Pt has mildly less residue and improved airway protection with honey thick liquids by spoon while using a chin tuck, but she needed Max-Total A for oral acceptance (unable to take boluses in by the cup or straw) and is anteriorly spilling more of what she does take in when in this posture. Study was limited by pt's continued coughing, sputtering, and seemingly difficult attempts at managing her secretions particularly when mixed with barium trials. There is still not a safe diet option to be started - I would continue with therapeutic trials of honey thick liquids or purees by spoon with use of a chin tuck. I think that it would be a laborous and high risk task to try to attempt a PO diet at this time, although the family could consider comfort feeds should they continue to have concerns about the use of a feeding tube. SLP Visit Diagnosis Dysphagia, oropharyngeal phase (R13.12) Attention and concentration deficit following -- Frontal lobe and executive function deficit following -- Impact on safety and function Severe aspiration risk;Risk for inadequate nutrition/hydration   CHL IP TREATMENT RECOMMENDATION 09/21/2017 Treatment Recommendations Therapy as outlined in treatment plan below   Prognosis 09/21/2017 Prognosis for Safe Diet Advancement Fair Barriers to Reach Goals Cognitive  deficits;Severity of deficits;Time post onset Barriers/Prognosis Comment -- CHL IP DIET  RECOMMENDATION 09/21/2017 SLP Diet Recommendations NPO;Alternative means - temporary Liquid Administration via -- Medication Administration Via alternative means Compensations -- Postural Changes --   CHL IP OTHER RECOMMENDATIONS 09/21/2017 Recommended Consults -- Oral Care Recommendations Oral care QID Other Recommendations Have oral suction available   CHL IP FOLLOW UP RECOMMENDATIONS 09/21/2017 Follow up Recommendations Inpatient Rehab   CHL IP FREQUENCY AND DURATION 09/21/2017 Speech Therapy Frequency (ACUTE ONLY) min 2x/week Treatment Duration 2 weeks      CHL IP ORAL PHASE 09/21/2017 Oral Phase Impaired Oral - Pudding Teaspoon -- Oral - Pudding Cup -- Oral - Honey Teaspoon Right anterior bolus loss;Weak lingual manipulation;Reduced posterior propulsion;Holding of bolus;Right pocketing in lateral sulci;Premature spillage Oral - Honey Cup NT Oral - Nectar Teaspoon -- Oral - Nectar Cup -- Oral - Nectar Straw -- Oral - Thin Teaspoon -- Oral - Thin Cup NT Oral - Thin Straw -- Oral - Puree Weak lingual manipulation;Reduced posterior propulsion;Holding of bolus;Right pocketing in lateral sulci;Premature spillage Oral - Mech Soft -- Oral - Regular -- Oral - Multi-Consistency -- Oral - Pill -- Oral Phase - Comment --  CHL IP PHARYNGEAL PHASE 09/21/2017 Pharyngeal Phase Impaired Pharyngeal- Pudding Teaspoon -- Pharyngeal -- Pharyngeal- Pudding Cup -- Pharyngeal -- Pharyngeal- Honey Teaspoon Delayed swallow initiation-pyriform sinuses;Reduced pharyngeal peristalsis;Reduced airway/laryngeal closure;Reduced tongue base retraction;Moderate aspiration;Penetration/Aspiration during swallow;Penetration/Apiration after swallow;Pharyngeal residue - valleculae;Pharyngeal residue - pyriform;Pharyngeal residue - posterior pharnyx Pharyngeal Material enters airway, passes BELOW cords and not ejected out despite cough attempt by patient Pharyngeal-  Honey Cup -- Pharyngeal -- Pharyngeal- Nectar Teaspoon -- Pharyngeal -- Pharyngeal- Nectar Cup -- Pharyngeal -- Pharyngeal- Nectar Straw -- Pharyngeal -- Pharyngeal- Thin Teaspoon -- Pharyngeal -- Pharyngeal- Thin Cup NT Pharyngeal -- Pharyngeal- Thin Straw -- Pharyngeal -- Pharyngeal- Puree Delayed swallow initiation-pyriform sinuses;Reduced pharyngeal peristalsis;Reduced airway/laryngeal closure;Reduced tongue base retraction;Penetration/Aspiration during swallow;Pharyngeal residue - valleculae;Pharyngeal residue - pyriform;Pharyngeal residue - posterior pharnyx Pharyngeal Material enters airway, remains ABOVE vocal cords then ejected out Pharyngeal- Mechanical Soft -- Pharyngeal -- Pharyngeal- Regular -- Pharyngeal -- Pharyngeal- Multi-consistency -- Pharyngeal -- Pharyngeal- Pill -- Pharyngeal -- Pharyngeal Comment --  CHL IP CERVICAL ESOPHAGEAL PHASE 09/21/2017 Cervical Esophageal Phase WFL Pudding Teaspoon -- Pudding Cup -- Honey Teaspoon -- Honey Cup -- Nectar Teaspoon -- Nectar Cup -- Nectar Straw -- Thin Teaspoon -- Thin Cup -- Thin Straw -- Puree -- Mechanical Soft -- Regular -- Multi-consistency -- Pill -- Cervical Esophageal Comment -- No flowsheet data found. Germain Osgood 09/21/2017, 3:08 PM  Germain Osgood, M.A. CCC-SLP [phone]             Scheduled Meds: . aspirin  300 mg Rectal Daily  . budesonide (PULMICORT) nebulizer solution  0.25 mg Nebulization BID  . chlorhexidine  15 mL Mouth Rinse BID  . free water  200 mL Per Tube Q8H  . ipratropium-albuterol  3 mL Nebulization TID  . levETIRAcetam  250 mg Per Tube BID  . mouth rinse  15 mL Mouth Rinse BID  . pantoprazole (PROTONIX) IV  40 mg Intravenous Q12H   Continuous Infusions: . feeding supplement (OSMOLITE 1.2 CAL) 1,000 mL (09/21/17 1949)  . heparin 900 Units/hr (09/22/17 0819)  . levofloxacin (LEVAQUIN) IV Stopped (09/22/17 0012)    LOS: 3 days   Kerney Elbe, DO [facility] Pager [phone]  If  7PM-7AM, please contact night-coverage [email or web address] Password [email or web address] 09/22/2017, 5:56 PM

## 2017-09-22 NOTE — Progress Notes (Signed)
  Speech Language Pathology Treatment: Dysphagia  Patient Details Name: Kayla Kent MRN: [id number] DOB: 1932/01/25 Today's Date: 09/22/2017 Time: 4098-1191 SLP Time Calculation (min) (ACUTE ONLY): 12 min  Assessment / Plan / Recommendation Clinical Impression  Pt had significant audible wetness at baseline, suggestive of poor secretion management. SLP provided Mod cues for coughing with simultaneous suction to get moderate amount of secretions expectorated. Once her vocal quality was cleared, SLP provided limited therapeutic trials of honey thick liquids by tsp. Pt tucks her chin well to command, but she also has oral holding and does not swallow to command until she picks her head back up into a neutral position. This puts her at a much higher risk for aspiration per MBS on previous date, therefore SLP provided cues for effortful coughing to again clear more secretions from her pharynx. Will continue to follow and progress as able.   HPI HPI: Pt is an 83 y.o.femalewith medical history significant for stage III chronic kidney disease, dyslipidemia, chronic atrial fibrillation previously on Eliquis, hypertension, seizure disorder on Keppra, and recent outpatient fine-needle aspiration of thyroid nodule. 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 She was intubated 11/25-11/27. MBS completed 11/28 showed a mdoerate-severe oropharyngeal dysphagia with delays in swallow initiation adn pharyngeal weakness. She was recommended to remain NPO  with therapeutic trials of puree. Pt went to CIR on 11/30 where she continued to work on swallow initiation but was readmitted to the hospital on 12/3 with suspected PNA.      SLP Plan  Continue with current plan of care       Recommendations  Diet recommendations: NPO Medication Administration: Via alternative means                Oral Care  Recommendations: Oral care QID Follow up Recommendations: Inpatient Rehab SLP Visit Diagnosis: Dysphagia, oropharyngeal phase (R13.12) Plan: Continue with current plan of care       GO                Germain Osgood 09/22/2017, 3:00 PM  Germain Osgood, M.A. CCC-SLP [phone]

## 2017-09-22 NOTE — Care Management Note (Addendum)
Case Management Note  Patient Details  Name: HONEY ZAKARIAN MRN: [id number] Date of Birth: 03/17/1932  Subjective/Objective:   From CIR presents with fever and suspected pna, acute resp failure with hypoxia, recent cva with dysphagia, afib/flutter, seizure d/o, gib,  12/11 [place], BSN -  Resp failure secondary to asp pna, cva with dysphagia, chronic afib/aflutter, s/p peg tube 12/10, tube feeds to start after 11 am today, plan for CIR when medically stable for dc.                    Action/Plan: NCM will follow for dc needs.   Expected Discharge Date:                  Expected Discharge Plan:  [place]  In-House Referral:     Discharge planning Services  CM Consult  Post Acute Care Choice:    Choice offered to:     DME Arranged:    DME Agency:     HH Arranged:    [place] Agency:     Status of Service:  In process, will continue to follow  If discussed at Long Length of Stay Meetings, dates discussed:    Additional Comments:  Zenon Mayo, RN 09/22/2017, 4:41 PM

## 2017-09-22 NOTE — Progress Notes (Signed)
Pt and pts son expressed pts wish to be DNR this AM. Pt states ok w/ intubation but not chest compressions. Provided education regarding this and pt and pts son state understanding. Messaged Dr. Alfredia Ferguson to relay all.

## 2017-09-22 NOTE — Plan of Care (Signed)
Pt has shown improvement right sided movement but is still unable to grip with rt hand.  Pt continues to tolerate tube feeds well but has not had abowel movement since restarting feeds. Bowel sounds are present. No acute events overnight.

## 2017-09-22 NOTE — Progress Notes (Signed)
ANTICOAGULATION CONSULT NOTE - Follow Up Consult  Pharmacy Consult for Heparin Indication: Afib and CVA  Patient Measurements: Weight: 105 lb 6.1 oz (47.8 kg)  IBW: 61 kg Heparin Dosing Weight: 48 kg  Vital Signs: Temp: 99.1 F (37.3 C) (12/06 0407) Temp Source: Axillary (12/06 0407) BP: 137/74 (12/06 0407) Pulse Rate: 102 (12/06 0407)  Labs: Recent Labs    09/19/17 1418 09/19/17 1922  09/20/17 0137  09/21/17 0420 09/21/17 0854 09/21/17 1458 09/22/17 0652  HGB  --   --    < > 9.4*  --  8.8*  --   --  9.5*  HCT  --   --   --  29.3*  --  27.7*  --   --  29.7*  PLT  --   --   --  434*  --  412*  --   --  448*  HEPARINUNFRC  --  0.63  --  0.84*   < > 0.20*  --  0.46 0.60  CREATININE  --   --   --   --   --   --  0.66  --   --   TROPONINI 0.04* 0.04*  --  0.04*  --   --   --   --   --    < > = values in this interval not displayed.    Estimated Creatinine Clearance: 40.2 mL/min (by C-G formula based on SCr of 0.66 mg/dL).  Medications:  Infusions:  . feeding supplement (OSMOLITE 1.2 CAL) 1,000 mL (09/21/17 1949)  . heparin 950 Units/hr (09/22/17 0506)  . levofloxacin (LEVAQUIN) IV Stopped (09/22/17 0012)    Assessment: 10 YOF who continues on Heparin for Aifb and new CVA 11/25 (PTA Eliquis on hold d/t NPO).  Heparin level this morning is slightly SUPRAtherapeutic of the lower goal range (HL 0.6 << 0.46, goal of 0.3-0.5). CBC low but stable. No overt s/sx of bleeding noted at this time  Goal of Therapy:  Heparin level 0.3-0.5 units/ml Monitor platelets by anticoagulation protocol: Yes   Plan:  - Decrease the Heparin drip slightly to 900 units/hr (9 ml/hr) - Will continue to monitor for any signs/symptoms of bleeding and will follow up with heparin level in 8 hours  Thank you for allowing pharmacy to be a part of this patient's care.  Alycia Rossetti, PharmD, BCPS Clinical Pharmacist Pager: [phone] Clinical phone for 09/22/2017 from 7a-3:30p: [phone] If after  3:30p, please call main pharmacy at: [phone] 09/22/2017 8:01 AM

## 2017-09-22 NOTE — Progress Notes (Signed)
ANTICOAGULATION CONSULT NOTE - Follow Up Consult  Pharmacy Consult for Heparin Indication: Afib and CVA  Patient Measurements: Weight: 105 lb 6.1 oz (47.8 kg)  IBW: 61 kg Heparin Dosing Weight: 48 kg  Vital Signs: Temp: 97.4 F (36.3 C) (12/06 1520) Temp Source: Oral (12/06 1520) BP: 138/66 (12/06 1520) Pulse Rate: 93 (12/06 1520)  Labs: Recent Labs    09/19/17 1922  09/20/17 0137  09/21/17 0420 09/21/17 0854 09/21/17 1458 09/22/17 0652 09/22/17 1629  HGB  --    < > 9.4*  --  8.8*  --   --  9.5*  --   HCT  --   --  29.3*  --  27.7*  --   --  29.7*  --   PLT  --   --  434*  --  412*  --   --  448*  --   HEPARINUNFRC 0.63  --  0.84*   < > 0.20*  --  0.46 0.60 0.44  CREATININE  --   --   --   --   --  0.66  --  0.65  --   TROPONINI 0.04*  --  0.04*  --   --   --   --   --   --    < > = values in this interval not displayed.    Estimated Creatinine Clearance: 40.2 mL/min (by C-G formula based on SCr of 0.65 mg/dL).  Medications:  Infusions:  . feeding supplement (OSMOLITE 1.2 CAL) 1,000 mL (09/21/17 1949)  . heparin 900 Units/hr (09/22/17 0819)  . levofloxacin (LEVAQUIN) IV Stopped (09/22/17 0012)    Assessment: 86 YOF who continues on Heparin for Aifb and new CVA 11/25 (PTA Eliquis on hold d/t NPO).  Heparin level this afternoon is within goal at 0.4 on 900 units/hr (goal of 0.3-0.5). CBC low but stable. No overt s/sx of bleeding noted at this time  Goal of Therapy:  Heparin level 0.3-0.5 units/ml Monitor platelets by anticoagulation protocol: Yes   Plan:  - Continue heparin drip at 900 units/hr (9 ml/hr)  Thank you for allowing pharmacy to be a part of this patient's care.  Erin Hearing PharmD., BCPS Clinical Pharmacist Pager [phone] 09/22/2017 5:26 PM

## 2017-09-22 NOTE — Care Management Important Message (Signed)
Important Message  Patient Details  Name: Kayla Kent MRN: [id number] Date of Birth: [birth year]   Medicare Important Message Given:  Yes    Zenon Mayo, RN 09/22/2017, 4:53 PMImportant Message  Patient Details  Name: Kayla Kent MRN: [id number] Date of Birth: [birth year]   Medicare Important Message Given:  Yes    Zenon Mayo, RN 09/22/2017, 4:53 PM

## 2017-09-23 ENCOUNTER — Inpatient Hospital Stay (HOSPITAL_COMMUNITY): Payer: Medicare Other

## 2017-09-23 DIAGNOSIS — J189 Pneumonia, unspecified organism: Secondary | ICD-10-CM

## 2017-09-23 DIAGNOSIS — E785 Hyperlipidemia, unspecified: Secondary | ICD-10-CM

## 2017-09-23 LAB — CBC WITH DIFFERENTIAL/PLATELET
BASOS PCT: 0 %
Basophils Absolute: 0.1 10*3/uL (ref 0.0–0.1)
EOS ABS: 0.2 10*3/uL (ref 0.0–0.7)
Eosinophils Relative: 1 %
HCT: 32.1 % — ABNORMAL LOW (ref 36.0–46.0)
HEMOGLOBIN: 9.8 g/dL — AB (ref 12.0–15.0)
Lymphocytes Relative: 9 %
Lymphs Abs: 1.4 10*3/uL (ref 0.7–4.0)
MCH: 30.1 pg (ref 26.0–34.0)
MCHC: 30.5 g/dL (ref 30.0–36.0)
MCV: 98.5 fL (ref 78.0–100.0)
Monocytes Absolute: 1.6 10*3/uL — ABNORMAL HIGH (ref 0.1–1.0)
Monocytes Relative: 9 %
NEUTROS PCT: 81 %
Neutro Abs: 13.7 10*3/uL — ABNORMAL HIGH (ref 1.7–7.7)
PLATELETS: 383 10*3/uL (ref 150–400)
RBC: 3.26 MIL/uL — AB (ref 3.87–5.11)
RDW: 16.9 % — ABNORMAL HIGH (ref 11.5–15.5)
WBC: 16.9 10*3/uL — AB (ref 4.0–10.5)

## 2017-09-23 LAB — GLUCOSE, CAPILLARY
GLUCOSE-CAPILLARY: 112 mg/dL — AB (ref 65–99)
GLUCOSE-CAPILLARY: 135 mg/dL — AB (ref 65–99)
Glucose-Capillary: 132 mg/dL — ABNORMAL HIGH (ref 65–99)
Glucose-Capillary: 122 mg/dL — ABNORMAL HIGH (ref 65–99)
Glucose-Capillary: 116 mg/dL — ABNORMAL HIGH (ref 65–99)

## 2017-09-23 LAB — COMPREHENSIVE METABOLIC PANEL
ALBUMIN: 2.3 g/dL — AB (ref 3.5–5.0)
ALK PHOS: 91 U/L (ref 38–126)
ALT: 39 U/L (ref 14–54)
ANION GAP: 11 (ref 5–15)
AST: 32 U/L (ref 15–41)
BUN: 15 mg/dL (ref 6–20)
CALCIUM: 8.3 mg/dL — AB (ref 8.9–10.3)
CHLORIDE: 103 mmol/L (ref 101–111)
CO2: 23 mmol/L (ref 22–32)
CREATININE: 0.63 mg/dL (ref 0.44–1.00)
GFR calc Af Amer: >60 mL/min (ref >60)
GFR calc non Af Amer: >60 mL/min (ref >60)
GLUCOSE: 114 mg/dL — AB (ref 65–99)
Potassium: 4.3 mmol/L (ref 3.5–5.1)
SODIUM: 137 mmol/L (ref 135–145)
Total Bilirubin: 0.5 mg/dL (ref 0.3–1.2)
Total Protein: 5.5 g/dL — ABNORMAL LOW (ref 6.5–8.1)

## 2017-09-23 LAB — PHOSPHORUS: Phosphorus: 3.7 mg/dL (ref 2.5–4.6)

## 2017-09-23 LAB — MAGNESIUM: Magnesium: 2 mg/dL (ref 1.7–2.4)

## 2017-09-23 LAB — HEPARIN LEVEL (UNFRACTIONATED)
HEPARIN UNFRACTIONATED: 0.26 [IU]/mL — AB (ref 0.30–0.70)
HEPARIN UNFRACTIONATED: 0.33 [IU]/mL (ref 0.30–0.70)

## 2017-09-23 MED ORDER — HEPARIN (PORCINE) IN NACL 100-0.45 UNIT/ML-% IJ SOLN
INTRAMUSCULAR | Status: AC
  Filled 2017-09-23: qty 250

## 2017-09-23 MED ORDER — OSMOLITE 1.2 CAL PO LIQD
1000.0000 mL | ORAL | Status: DC
  Administered 2017-09-23 – 2017-09-28 (×5): 1000 mL
  Filled 2017-09-23 (×9): qty 1000

## 2017-09-23 MED ORDER — VANCOMYCIN HCL IN DEXTROSE 750-5 MG/150ML-% IV SOLN
750.0000 mg | INTRAVENOUS | Status: DC
  Administered 2017-09-24 – 2017-09-27 (×4): 750 mg via INTRAVENOUS
  Filled 2017-09-23 (×5): qty 150

## 2017-09-23 MED ORDER — VANCOMYCIN HCL IN DEXTROSE 750-5 MG/150ML-% IV SOLN
750.0000 mg | Freq: Once | INTRAVENOUS | Status: AC
  Administered 2017-09-23: 750 mg via INTRAVENOUS
  Filled 2017-09-23: qty 150

## 2017-09-23 MED ORDER — METOPROLOL TARTRATE 25 MG/10 ML ORAL SUSPENSION
25.0000 mg | Freq: Two times a day (BID) | ORAL | Status: DC
  Administered 2017-09-23 – 2017-09-24 (×3): 25 mg
  Filled 2017-09-23 (×3): qty 10

## 2017-09-23 MED ORDER — METOPROLOL TARTRATE 25 MG/10 ML ORAL SUSPENSION: 12.5000 mg | Freq: Two times a day (BID) | ORAL | Status: DC

## 2017-09-23 MED ORDER — FUROSEMIDE 10 MG/ML IJ SOLN
20.0000 mg | Freq: Once | INTRAMUSCULAR | Status: AC
  Administered 2017-09-23: 20 mg via INTRAVENOUS
  Filled 2017-09-23: qty 2

## 2017-09-23 NOTE — Progress Notes (Signed)
Upon review of rhythm strip, pt w/ 10 beat run of vtach @ 0627 and 10 beat run of vtach at 0704.  Pt also w/ very frequent PVCs. Pt not currently on BB. Spoke w/ attending MD to relay who is currently on unit

## 2017-09-23 NOTE — Progress Notes (Signed)
ANTICOAGULATION CONSULT NOTE - Follow Up Consult  Pharmacy Consult for Heparin Indication: Afib and CVA  Patient Measurements: Weight: 100 lb 8.5 oz (45.6 kg)  IBW: 61 kg Heparin Dosing Weight: 48 kg  Vital Signs: Temp: 97.7 F (36.5 C) (12/07 1211) Temp Source: Axillary (12/07 1211) BP: 149/47 (12/07 1211) Pulse Rate: 92 (12/07 1211)  Assessment: 74 YOF who continues on heparin for hx afib, new stroke 11/25 (PTA Eliquis) Had GIB on admit 11/20, AC resume with Heparin on 11/28. Lower heparin goal of 0.3-0.5. Heparin level is therapeutic at 0.33. Hgb 9.8, plts wnl.  Goal of Therapy:  Heparin level 0.3-0.5 units/ml Monitor platelets by anticoagulation protocol: Yes   Plan:  Continue heparin gtt at 900 units/hr Monitor daily heparin level, CBC, s/s of bleed Plan is to transition to Apixaban when able to swallow safely or has PEG tube per neuro  Elenor Quinones, PharmD, BCPS Clinical Pharmacist Pager [phone] 09/23/2017 1:22 PM

## 2017-09-23 NOTE — Progress Notes (Signed)
ANTICOAGULATION CONSULT NOTE - Follow Up Consult  Pharmacy Consult for Heparin Indication: Afib and CVA  Patient Measurements: Weight: 100 lb 8.5 oz (45.6 kg)  IBW: 61 kg Heparin Dosing Weight: 48 kg  Vital Signs: Temp: 99.3 F (37.4 C) (12/07 0822) Temp Source: Axillary (12/07 0822) BP: 136/44 (12/07 0822) Pulse Rate: 88 (12/07 8343)  Assessment: 74 YOF who continues on heparin for hx afib, new stroke 11/25 (PTA Eliquis) Had GIB on admit 11/20, AC resume with Heparin on 11/28. Lower heparin goal of 0.3-0.5. Heparin level is borderline low at 0.26 this am but was elevated yesterday at a slightly higher rate. Will recheck level today to see if rate change needed. Hgb 9.8, plts wnl.  Goal of Therapy:  Heparin level 0.3-0.5 units/ml Monitor platelets by anticoagulation protocol: Yes   Plan:  Continue heparin gtt at 900 units/hr Recheck heparin level around lunch Monitor daily heparin level, CBC, s/s of bleed Plan is to transition to Apixaban when able to swallow safely or has PEG tube per neuro  Thank you for allowing pharmacy to be a part of this patient's care.  Elenor Quinones, PharmD, BCPS Clinical Pharmacist Pager [phone] 09/23/2017 8:25 AM

## 2017-09-23 NOTE — Progress Notes (Signed)
PROGRESS NOTE    Kayla Kent  KDX:[id number] DOB: [birth year] DOA: 09/19/2017 PCP: Biagio Borg, MD   Brief Narrative:  Kayla Kent a 83 y.o.femalewith medical history significant for stage III chronic kidney disease, dyslipidemia, chronic atrial fibrillation previously on Eliquis, hypertension, seizure disorder on Keppra, and recent outpatient fine-needle aspiration of thyroid nodule.  Patient was initially admitted on 11/20 secondary to acute upper GI bleeding Heme Positive as well as multidrug-resistant Klebsiella pneumonia.   On 11/25 patient was aphasic with respiratory distress and was later found to have an acute MCA stroke.  Her eliquis had been held due to the GI bleed.  11/25: underwent thrombolysis of left middle cerebral artery She was treated for aspiration pneumonia as well and was intubated. She was eventually discharged to CIR on 11/30 On 12/3 she was evaluated for fever and suspected to have continued Aspiration pneumonia and readmitted to the hospital. On 12/5 she underwent MBS again and failed and will continue TF via Cortrack.  12/6 IR Consulted for PEG Placement. Awaiting to be done and discussed with Dr. Laurence Ferrari of IR and states it may not be done until early next week.   Assessment & Plan:   Active Problems:   Anxiety   Essential hypertension   Atrial fibrillation with RVR (HCC)   Acute renal failure (HCC)   Gastroesophageal reflux disease   CKD (chronic kidney disease) stage 3, GFR 30-59 ml/min (HCC)   Chronic atrial fibrillation (HCC)   Dyslipidemia   COPD (chronic obstructive pulmonary disease) (HCC)   Acute hypoxemic respiratory failure (HCC)   Cerebral thrombosis with cerebral infarction   Aspiration pneumonia due to gastric secretions (HCC)   Acute ischemic left MCA stroke (HCC)   Acute respiratory failure with hypoxia (HCC)   Dysphagia  Acute respiratory failure with hypoxia (HCC) 2/2 to Aspiration PNA - Leukocytosis went from 14.6 ->  16.7 -> 9.9 -> 13.1 -> 16.9 - Possible Aspiration Pneumonia again- low grade fever yesterday with possible Hypoxia- now is 100% on 2 L O2 - Apparently completed 5 days of antibiotics on 11/25 - On last admission: sputum and blood cultures were negative & resp virus panel was Negative - Respiratory Virus Panel Negative - WBC worsened from 13.1 -> 16.9 - Blood Cx x2 show NGTD at 4 Days - Now on Levaquin- will continue to follow; Will broaden Abx Covearge and add Vancomycin  - Repeat CXR 12/4 showed Right upper lobe pneumonia. Probable left lower lobe pneumonia andright infrahilar atelectasis. When compared to yesterday's study there has not been dramatic interval change. Thoracic aortic atherosclerosis. - Repeat CXR this AM showed Feeding tube noted with tip below left hemidiaphragm. Cardiomegaly. Persistent right upper lobe infiltrate. Increased interstitial markings noted bilaterally. Component congestive heart failure cannot be excluded. Small bilateral pleural effusions. No Pneumothorax. -Will give one dose of IV Lasix 20 mg Once given component of CHF on CXR -Repeat CXR in AM  Recent CVA with Dysphagia - will return to CIR when stable - has been on IV Heparin at CIR- continue for now until PEG in Place - Cont tube feeds through Cortrack for now - Repeat MBS showed that patient did not pass  - IR consulted for PEG Placement. Patient and Family agreeable and currently awaiting to be done -Dietician evaluated and adjusted TF  A-Fib/ Flutter -Was not on rate controlling agent- cont Heparin for now and restart Eliquis when Appropriate through PEG Tube -Added Liquid Metoprolol 25 mg BID through PEG   HTN (  hypertension) -Current blood pressure stable  -Hold previous Clonidine -Can utilize hydralazine IV prn for episodic hypertension -Started Patient on Metoprolol 25 mg BID   CKD (chronic kidney disease), stage III  -Renal function stable and at baseline -BUN/Cr was  15/0.63  Seizure Disorder -Cont IV Keppra for Now until able to through PEG when it is placed  GI bleed? -EGD was normal on 11/23  Thyroid nodule -Recent FNA of thyroid nodule with indeterminate pathology; testing ordered by PCP and patient can follow-up with this physician to discuss significance of findings  Dyslipidemia -Statin on hold until can reintroduce tube feedings through PEG  Leukocytosis, worsening  -WBC went from 9.9 -> 13.1 -> 16.7 -Patient on IV Levofloxacin for PNA; Will Broaden and add IV Vancomyucin -Repeat CBC in AM  Thrombocytosis, improved  -Likely Reactive -Platelet Count went from 412 -> 448 -> 383 -Continue to Monitor and Repeat CBC in AM   ? Nonsustained VTACH -Electrolytes wNL -C/w Telemetry -Add Metoprolol 25 mg po BID through Cortrak  Severe Malnutrition in Context of Chronic Illness -Nutritionist evaluated  -Recommending Decrease TF rate to Osmolite 1.2 @ 60 ml/hr (1440 ml daily) Providing, 1728 kcals, 80 g Pro, 1181 ml fluid + free water at 200 ml q 8 hrs  DVT prophylaxis: Anticoagulated with Heparin gtt Code Status: Partial CODE; OK with Intubation no Shocks or Compressions Family Communication: No family present at bedsides Disposition Plan: CIR when Medically Stable for D/C  Consultants:   Interventional Radiology   Procedures:  ECHOCARDIOGRAM Study Conclusions  - Left ventricle: The cavity size was normal. Wall thickness was   increased in a pattern of mild LVH. Systolic function was normal.   The estimated ejection fraction was in the range of 60% to 65%.   Wall motion was normal; there were no regional wall motion   abnormalities. - Mitral valve: There was mild regurgitation. - Left atrium: The atrium was severely dilated. - Right atrium: The atrium was mildly dilated. - Pulmonary arteries: Systolic pressure was mildly increased. PA   peak pressure: 33 mm Hg (S).   Antimicrobials:  Anti-infectives (From admission,  onward)   Start     Dose/Rate Route Frequency Ordered Stop   09/24/17 0800  vancomycin (VANCOCIN) IVPB 750 mg/150 ml premix     750 mg 150 mL/hr over 60 Minutes Intravenous Every 24 hours 09/23/17 0817     09/23/17 0830  vancomycin (VANCOCIN) IVPB 750 mg/150 ml premix     750 mg 150 mL/hr over 60 Minutes Intravenous  Once 09/23/17 0807 09/23/17 1007   09/21/17 2200  levofloxacin (LEVAQUIN) IVPB 750 mg     750 mg 100 mL/hr over 90 Minutes Intravenous Every 48 hours 09/20/17 1030     09/20/17 0500  levofloxacin (LEVAQUIN) IVPB 500 mg  Status:  Discontinued     500 mg 100 mL/hr over 60 Minutes Intravenous Every 24 hours 09/19/17 1443 09/20/17 1030   09/19/17 1345  levofloxacin (LEVAQUIN) IVPB 750 mg  Status:  Discontinued     750 mg 100 mL/hr over 90 Minutes Intravenous Every 24 hours 09/19/17 1339 09/19/17 1444     Subjective: Seen and examined at beside and is fully awake and responsive but unable to speak because of aphasia. Denied any pain. No acute events overnight but patient did have some non-sustained VTACH earlier today.    Objective: Vitals:   09/23/17 0900 09/23/17 1000 09/23/17 1211 09/23/17 1326  BP: (!) 122/52 (!) 148/62 (!) 149/47   Pulse: 81  93 92   Resp: 19 (!) 25 19   Temp:   97.7 F (36.5 C)   TempSrc:   Axillary   SpO2: 98% 99% 99% 98%  Weight:        Intake/Output Summary (Last 24 hours) at 09/23/2017 1612 Last data filed at 09/23/2017 1300 Gross per 24 hour  Intake 2584.01 ml  Output 1050 ml  Net 1534.01 ml   Filed Weights   09/22/17 0407 09/23/17 0300 09/23/17 0500  Weight: 47.8 kg (105 lb 6.1 oz) 44 kg (97 lb 0 oz) 45.6 kg (100 lb 8.5 oz)   Examination: Physical Exam:  Constitutional: Thin Frail Cachectic appearing Caucasian female in NAD Eyes: Sclerae anicteric. Lids normal ENMT: External Ears normal. Cortrack in place. MMM Neck: Supple with no JVD Respiratory: Diminished to auscultation with rhonchi and crackles. No appreciable wheezing.  Unlabored breathing but has some gargling  Cardiovascular: Slightly tachydardic rate and irregular rhythm. No extremity edema Abdomen: Soft, NT, ND. Bowel sounds present. No appreciable masses GU: Deferred Musculoskeletal: No contractures. No cyanosis Skin: No rashes or lesions on a limited skin eval Neurologic: Aphasic with Right arm weakness. Psychiatric: Awake and Alert. Normal mood and affect  Data Reviewed: I have personally reviewed following labs and imaging studies  CBC: Recent Labs  Lab 09/17/17 0207  09/19/17 0138 09/20/17 0137 09/21/17 0420 09/22/17 0652 09/23/17 0445  WBC 15.7*   < > 14.6* 16.7* 9.9 13.1* 16.9*  NEUTROABS 12.9*  --  11.4*  --   --  10.5* 13.7*  HGB 9.7*   < > 9.1* 9.4* 8.8* 9.5* 9.8*  HCT 30.3*   < > 28.4* 29.3* 27.7* 29.7* 32.1*  MCV 94.4   < > 95.0 94.5 95.8 96.4 98.5  PLT 420*   < > 417* 434* 412* 448* 383   < > = values in this interval not displayed.   Basic Metabolic Panel: Recent Labs  Lab 09/17/17 0207 09/19/17 0407 09/21/17 0854 09/22/17 0652 09/23/17 0445  NA 140 137 136 137 137  K 4.2 4.2 4.0 4.0 4.3  CL 109 103 100* 103 103  CO2 25 26 26 25 23   GLUCOSE 118* 109* 144* 127* 114*  BUN 25* 19 16 16 15   CREATININE 0.60 0.72 0.66 0.65 0.63  CALCIUM 7.6* 8.1* 7.7* 8.1* 8.3*  MG 2.1  --  2.0 2.0 2.0  PHOS 2.2*  --  3.1 2.7 3.7   GFR: Estimated Creatinine Clearance: 38.4 mL/min (by C-G formula based on SCr of 0.63 mg/dL). Liver Function Tests: Recent Labs  Lab 09/17/17 0207 09/21/17 0854 09/22/17 0652 09/23/17 0445  AST 59* 36 29 32  ALT 70* 50 41 39  ALKPHOS 92 95 85 91  BILITOT 0.4 0.5 0.5 0.5  PROT 4.9* 5.2* 5.1* 5.5*  ALBUMIN 2.0* 2.1* 2.1* 2.3*   No results for input(s): LIPASE, AMYLASE in the last 168 hours. No results for input(s): AMMONIA in the last 168 hours. Coagulation Profile: No results for input(s): INR, PROTIME in the last 168 hours. Cardiac Enzymes: Recent Labs  Lab 09/19/17 0743 09/19/17 1245  09/19/17 1418 09/19/17 1922 09/20/17 0137  TROPONINI 0.04* 0.03* 0.04* 0.04* 0.04*   BNP (last 3 results) Recent Labs    10/22/16 1135  PROBNP 410.0*   HbA1C: No results for input(s): HGBA1C in the last 72 hours. CBG: Recent Labs  Lab 09/22/17 1535 09/22/17 2049 09/22/17 2340 09/23/17 0352 09/23/17 0819  GLUCAP 105* 119* 116* 132* 122*   Lipid Profile: No results  for input(s): CHOL, HDL, LDLCALC, TRIG, CHOLHDL, LDLDIRECT in the last 72 hours. Thyroid Function Tests: No results for input(s): TSH, T4TOTAL, FREET4, T3FREE, THYROIDAB in the last 72 hours. Anemia Panel: No results for input(s): VITAMINB12, FOLATE, FERRITIN, TIBC, IRON, RETICCTPCT in the last 72 hours. Sepsis Labs: Recent Labs  Lab 09/19/17 0407 09/19/17 0743  PROCALCITON  --  0.13  LATICACIDVEN 1.6 1.3    Recent Results (from the past 240 hour(s))  Culture, blood (Routine X 2) w Reflex to ID Panel     Status: None (Preliminary result)   Collection Time: 09/19/17  7:35 AM  Result Value Ref Range Status   Specimen Description BLOOD LEFT HAND  Final   Special Requests IN PEDIATRIC BOTTLE Blood Culture adequate volume  Final   Culture NO GROWTH 4 DAYS  Final   Report Status PENDING  Incomplete  Culture, blood (Routine X 2) w Reflex to ID Panel     Status: None (Preliminary result)   Collection Time: 09/19/17  7:40 AM  Result Value Ref Range Status   Specimen Description BLOOD LEFT HAND  Final   Special Requests IN PEDIATRIC BOTTLE Blood Culture adequate volume  Final   Culture NO GROWTH 4 DAYS  Final   Report Status PENDING  Incomplete  Respiratory Panel by PCR     Status: None   Collection Time: 09/19/17  2:17 PM  Result Value Ref Range Status   Adenovirus NOT DETECTED NOT DETECTED Final   Coronavirus 229E NOT DETECTED NOT DETECTED Final   Coronavirus HKU1 NOT DETECTED NOT DETECTED Final   Coronavirus NL63 NOT DETECTED NOT DETECTED Final   Coronavirus OC43 NOT DETECTED NOT DETECTED Final    Metapneumovirus NOT DETECTED NOT DETECTED Final   Rhinovirus / Enterovirus NOT DETECTED NOT DETECTED Final   Influenza A NOT DETECTED NOT DETECTED Final   Influenza B NOT DETECTED NOT DETECTED Final   Parainfluenza Virus 1 NOT DETECTED NOT DETECTED Final   Parainfluenza Virus 2 NOT DETECTED NOT DETECTED Final   Parainfluenza Virus 3 NOT DETECTED NOT DETECTED Final   Parainfluenza Virus 4 NOT DETECTED NOT DETECTED Final   Respiratory Syncytial Virus NOT DETECTED NOT DETECTED Final   Bordetella pertussis NOT DETECTED NOT DETECTED Final   Chlamydophila pneumoniae NOT DETECTED NOT DETECTED Final   Mycoplasma pneumoniae NOT DETECTED NOT DETECTED Final  Culture, Urine     Status: None   Collection Time: 09/19/17  5:21 PM  Result Value Ref Range Status   Specimen Description URINE, RANDOM  Final   Special Requests NONE  Final   Culture NO GROWTH  Final   Report Status 09/20/2017 FINAL  Final    Radiology Studies: Dg Chest Port 1 View  Result Date: 09/23/2017 CLINICAL DATA:  Shortness of breath. EXAM: PORTABLE CHEST 1 VIEW COMPARISON:  09/21/2017 . FINDINGS: Feeding tube noted with tip below left hemidiaphragm. Stable cardiomegaly. Persistent right upper lobe infiltrate. Increased interstitial markings noted bilaterally. Component congestive heart failure cannot be excluded. Small bilateral pleural effusions. No pneumothorax . IMPRESSION: 1. Feeding tube noted with tip below left hemidiaphragm. 2. Persistent right upper lobe infiltrate consistent with pneumonia. Component of atelectasis also present. 3. Cardiomegaly. Increased pulmonary interstitial prominence and small bilateral pleural effusions. Findings suggest a component of CHF. Electronically Signed   By: Marcello Moores  Register   On: 09/23/2017 08:37   Scheduled Meds: . aspirin  300 mg Rectal Daily  . budesonide (PULMICORT) nebulizer solution  0.25 mg Nebulization BID  . chlorhexidine  15  mL Mouth Rinse BID  . free water  200 mL Per Tube Q8H   . ipratropium-albuterol  3 mL Nebulization TID  . levETIRAcetam  250 mg Per Tube BID  . mouth rinse  15 mL Mouth Rinse BID  . metoprolol tartrate  25 mg Per Tube BID  . pantoprazole (PROTONIX) IV  40 mg Intravenous Q12H   Continuous Infusions: . feeding supplement (OSMOLITE 1.2 CAL)    . heparin    . heparin 900 Units/hr (09/22/17 0819)  . levofloxacin (LEVAQUIN) IV Stopped (09/22/17 0012)  . [START ON 09/24/2017] vancomycin      LOS: 4 days   Kerney Elbe, DO [facility] Pager [phone]  If 7PM-7AM, please contact night-coverage [email or web address] Password [email or web address] 09/23/2017, 4:12 PM

## 2017-09-23 NOTE — Progress Notes (Addendum)
Initial Nutrition Assessment  DOCUMENTATION CODES:   Severe malnutrition in context of chronic illness, Underweight  INTERVENTION:    Decrease TF rate to Osmolite 1.2 @ 60 ml/hr (1440 ml daily)  Providing, 1728 kcals, 80 g Pro, 1181 ml fluid + free water at 200 ml q 8 hrs.   NUTRITION DIAGNOSIS:   Severe Malnutrition related to chronic illness(Dysphagia/COPD/CKD) as evidenced by severe muscle depletion, severe fat depletion.  GOAL:   Patient will meet greater than or equal to 90% of their needs  MONITOR:   TF tolerance, Diet advancement, Labs, Weight trends, I & O's, Skin  REASON FOR ASSESSMENT:   Other (Comment)(Pt admitted on TF)    ASSESSMENT:   Pt with PMH of seizure d/o, HTN, HLD, PVD, CKD stage III, and COPD with multiple recent admissions presents with acute respiratory failure with hypoxia.    11/20 admitted with acute upper GIB 11/25 respiratory distress and acute MCA stroke, intubated 11/27 extubated 11/28 failed swallow eval, Cortrak placed 11/30 discharged to CIR 12/3 fever with suspected aspiration pneumonia and readmitted 12/5 failed MBS, continues TF via Cortrak 12/6 IR consulted for PEG placement  Discussed pt with RN and MD.  Reports pt is tolerating tube feeding regimen. Pt now receiving continuous feeds over 24 hours RD to adjust the rate to meet pt's needs.  Pt receiving free water at 200 mL every 8 hours.  Pt exhibits downward trend in weight. Per chart, pt is net positive 4 L since admission.   Labs reviewed; CBG 111-132, Albumin 2.3, Hemoglobin 9.8 Medications reviewed; Protonix  NUTRITION - FOCUSED PHYSICAL EXAM:    Most Recent Value  Orbital Region  Severe depletion  Upper Arm Region  Severe depletion  Thoracic and Lumbar Region  Moderate depletion  Buccal Region  No depletion  Temple Region  Severe depletion  Clavicle Bone Region  Moderate depletion  Clavicle and Acromion Bone Region  Severe depletion  Scapular Bone Region   Unable to assess  Dorsal Hand  Moderate depletion  Patellar Region  Sev1ere depletion  Anterior Thigh Region  Severe depletion  Posterior Calf Region  Severe depletion  Edema (RD Assessment)  None       Diet Order:  Diet NPO time specified  EDUCATION NEEDS:   No education needs have been identified at this time  Skin:  Skin Assessment: Skin Integrity Issues: Skin Integrity Issues:: Stage I, Incisions Stage I: sacrum and buttocks Incisions: R groin  Last BM:  09/22/17  Height:   Ht Readings from Last 1 Encounters:  09/16/17 5\' 7"  (1.702 m)    Weight:   Wt Readings from Last 1 Encounters:  09/23/17 100 lb 8.5 oz (45.6 kg)    Ideal Body Weight:  61.4 kg  BMI:  Body mass index is 15.75 kg/m.  Estimated Nutritional Needs:   Kcal:  1600-1800  Protein:  70-80 grams  Fluid:  >/= 1.6 L/d  Parks Ranger, MS, RDN, LDN 09/23/2017 3:01 PM

## 2017-09-23 NOTE — Consult Note (Signed)
   [facility] CM Inpatient Consult   09/23/2017  Kayla Kent 1932/02/01 [id number]    Patient was active with [facility] recently.   She was in CIR but transferred to stepdown unit. Will continue to follow along for disposition and make appropriate [facility] referrals when warranted.   Kayla Kent currently in stepdown unit. Spoke with inpatient stepdown unit RNCM to make aware that writer is following for [facility] services.    Marthenia Rolling, [name], RN,BSN [facility] Liaison [phone]

## 2017-09-23 NOTE — Progress Notes (Signed)
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    PT Comments    Pt admitted with above diagnosis. Pt currently with functional limitations due to balance and endurance deficits. Pt was able to ambulate with min to mod assist with pt needing assist due to right lateral and posterior lean.  Pt is progressing as she was able to walk further.  Fatigues quickly.  Pt will benefit from skilled PT to increase their independence and safety with mobility to allow discharge to the venue listed below.     Follow Up Recommendations  CIR     Equipment Recommendations  None recommended by PT    Recommendations for Other Services Rehab consult     Precautions / Restrictions Precautions Precautions: Fall Precaution Comments: R hemiplegia Restrictions Weight Bearing Restrictions: No    Mobility  Bed Mobility Overal bed mobility: Needs Assistance Bed Mobility: Rolling;Supine to Sit;Sit to Supine Rolling: Mod assist Sidelying to sit: Mod assist Supine to sit: Mod assist     General bed mobility comments: pt need assist to come forward, then assisted minimally to scoot to EOB  Transfers Overall transfer level: Needs assistance Equipment used: 2 person hand held assist Transfers: Sit to/from Stand Sit to Stand: Max assist         General transfer comment: NEeded assist to power up and was slightly flexed as well as heavy posterior and right lateral lean.   Ambulation/Gait Ambulation/Gait assistance: Min assist;Mod assist;+2 safety/equipment(+1 for chair follow) Ambulation Distance (Feet): 50 Feet Assistive device: 1 person hand held assist Gait  Pattern/deviations: Shuffle;Trunk flexed;Narrow base of support;Step-through pattern;Decreased step length - right;Decreased step length - left;Ataxic   Gait velocity interpretation: Below normal speed for age/gender General Gait Details: Slightly flexed trunk needing constant cues to stand upright.  Pt with short steps and slightly ataxic and scissoring.  Pt leans right and posterior therefore giving faciliatation at trunk.  Pt also pushes with left UE for balance but tends to push away thus causing her to lean > to right.  Overall poor balance with definite need of external support.     Stairs            Wheelchair Mobility    Modified Rankin (Stroke Patients Only) Modified Rankin (Stroke Patients Only) Pre-Morbid Rankin Score: No symptoms Modified Rankin: Severe disability     Balance Overall balance assessment: Needs assistance Sitting-balance support: No upper extremity supported Sitting balance-Leahy Scale: Fair Sitting balance - Comments: min guard assist to sit EOB.  Postural control: Posterior lean;Right lateral lean Standing balance support: During functional activity;Single extremity supported Standing balance-Leahy Scale: Poor Standing balance comment: kyphotic posture with mod assist for standing balance with flexed posture as well as posterior and right lateral lean                            Cognition Arousal/Alertness: Awake/alert Behavior During Therapy: Flat affect Overall Cognitive Status: Difficult to assess(aphasia) Area of Impairment: Attention;Orientation;Following commands;Safety/judgement;Problem solving;Awareness;Memory                 Orientation Level: Time Current Attention Level: Sustained   Following Commands: Follows one  step commands with increased time Safety/Judgement: Decreased awareness of deficits;Decreased awareness of safety Awareness: Intellectual Problem Solving: Decreased initiation;Slow processing General  Comments: No verbalizations      Exercises General Exercises - Lower Extremity Ankle Circles/Pumps: AROM;Both;5 reps;Seated Long Arc Quad: AROM;Both;10 reps;Seated Hip Flexion/Marching: AROM;Both;5 reps;Seated    General Comments General comments (skin integrity, edema, etc.): HR 86-110 bpm.        Pertinent Vitals/Pain Pain Assessment: No/denies pain Faces Pain Scale: No hurt    Home Living                      Prior Function            PT Goals (current goals can now be found in the care plan section) Acute Rehab PT Goals Patient Stated Goal: none stated this session Progress towards PT goals: Progressing toward goals    Frequency    Min 3X/week      PT Plan Current plan remains appropriate    Co-evaluation              AM-PAC PT "6 Clicks" Daily Activity  Outcome Measure  Difficulty turning over in bed (including adjusting bedclothes, sheets and blankets)?: Unable Difficulty moving from lying on back to sitting on the side of the bed? : Unable Difficulty sitting down on and standing up from a chair with arms (e.g., wheelchair, bedside commode, etc,.)?: Unable Help needed moving to and from a bed to chair (including a wheelchair)?: A Lot Help needed walking in hospital room?: A Lot Help needed climbing 3-5 steps with a railing? : Total 6 Click Score: 8    End of Session Equipment Utilized During Treatment: Gait belt;Oxygen Activity Tolerance: Patient tolerated treatment well Patient left: with call bell/phone within reach;in chair;with chair alarm set Nurse Communication: Mobility status PT Visit Diagnosis: Muscle weakness (generalized) (M62.81);Hemiplegia and hemiparesis;Other abnormalities of gait and mobility (R26.89) Hemiplegia - Right/Left: Right Hemiplegia - dominant/non-dominant: Dominant Hemiplegia - caused by: Cerebral infarction     Time: 8016-5537 PT Time Calculation (min) (ACUTE ONLY): 26 min  Charges:  $Gait Training:  23-37 mins                    G Codes:       Kayla Kent,PT Acute Rehabilitation [phone] (pager)    Kayla Kent 09/23/2017, 1:12 PM

## 2017-09-23 NOTE — Progress Notes (Signed)
Pharmacy Antibiotic Note  Kayla Kent is a 83 y.o. female admitted on 09/19/2017 with pneumonia.  Pharmacy has been consulted for vancomycin dosing.  Has been on abx for an extended time this admission. New CXR worrisome for PNA. Afebrile, WBC trending up to 16.9 today. SCr stable, CrCl ~64ml/min.  Plan: Continue Levaquin 750mg  IV Q48h per MD Give vancomycin 750mg  IV x 1, then start vancomycin 750mg  IV Q24h Monitor clinical picture, renal function, VT prn F/U C&S, abx deescalation / LOT   Weight: 100 lb 8.5 oz (45.6 kg)  Temp (24hrs), Avg:97.9 F (36.6 C), Min:97.4 F (36.3 C), Max:98.3 F (36.8 C)  Recent Labs  Lab 09/17/17 0207  09/19/17 0138 09/19/17 0407 09/19/17 0743 09/20/17 0137 09/21/17 0420 09/21/17 0854 09/22/17 0652 09/23/17 0445  WBC 15.7*   < > 14.6*  --   --  16.7* 9.9  --  13.1* 16.9*  CREATININE 0.60  --   --  0.72  --   --   --  0.66 0.65 0.63  LATICACIDVEN  --   --   --  1.6 1.3  --   --   --   --   --    < > = values in this interval not displayed.    Estimated Creatinine Clearance: 38.4 mL/min (by C-G formula based on SCr of 0.63 mg/dL).    Allergies  Allergen Reactions  . Chocolate Hives  . Codeine Other (See Comments)    Patient states she acts crazy  . Diazepam Other (See Comments)    Patient states she acts crazy.  . Fruit & Vegetable Daily [Nutritional Supplements] Hives and Swelling    peaches  . Iohexol Hives     Code: HIVES, Desc: pt gets 13 hr pre-meds, Onset Date: [id number]   . Latex Hives  . Peach [Prunus Persica] Hives  . Peanut-Containing Drug Products Hives and Swelling  . Penicillins Hives, Itching and Swelling    Has patient had a PCN reaction causing immediate rash, facial/tongue/throat swelling, SOB or lightheadedness with hypotension: Yes Has patient had a PCN reaction causing severe rash involving mucus membranes or skin necrosis: No Has patient had a PCN reaction that required hospitalization No Has patient had a PCN  reaction occurring within the last 10 years: No If all of the above answers are "NO", then may proceed with Cephalosporin use.   . Strawberry Extract Hives  . Sulfonamide Derivatives Hives    nausea  . Wheat Shortness Of Breath    Shortness of breath  . Wheat Bran Shortness Of Breath  . Sulfamethoxazole Hives  . Crestor [Rosuvastatin Calcium] Rash  . Iodine Rash    Antimicrobials this admission: Cipro 11/20>>11/21 Meropenem 11/21>>11/28 Vancomycin 11/21>>11/25; 12/7 >> Zyvox 11/25>>11/28 Levaquin 12/3 >>   Dose adjustments this admission: n/a  Microbiology results: 11/20 Blood x 2: negF 11/20 Urine: >100K Kleb pneumo - ESBL, sensitive to cipro, imi, gent  11/20 MRSA PCR: neg 11/22 Strep pneumo Ag - negative 11/25 BC x 2 > negF  11/25 urine > negF  Thank you for allowing pharmacy to be a part of this patient's care.  Reginia Naas 09/23/2017 8:08 AM

## 2017-09-24 ENCOUNTER — Inpatient Hospital Stay (HOSPITAL_COMMUNITY): Payer: Medicare Other

## 2017-09-24 ENCOUNTER — Other Ambulatory Visit: Payer: Self-pay

## 2017-09-24 LAB — COMPREHENSIVE METABOLIC PANEL
ALBUMIN: 2.2 g/dL — AB (ref 3.5–5.0)
ALK PHOS: 86 U/L (ref 38–126)
ALT: 49 U/L (ref 14–54)
ANION GAP: 9 (ref 5–15)
AST: 43 U/L — ABNORMAL HIGH (ref 15–41)
BILIRUBIN TOTAL: 0.3 mg/dL (ref 0.3–1.2)
BUN: 15 mg/dL (ref 6–20)
CALCIUM: 8.3 mg/dL — AB (ref 8.9–10.3)
CO2: 28 mmol/L (ref 22–32)
CREATININE: 0.64 mg/dL (ref 0.44–1.00)
Chloride: 100 mmol/L — ABNORMAL LOW (ref 101–111)
GFR calc Af Amer: >60 mL/min (ref >60)
GFR calc non Af Amer: >60 mL/min (ref >60)
GLUCOSE: 113 mg/dL — AB (ref 65–99)
Potassium: 4.5 mmol/L (ref 3.5–5.1)
Sodium: 137 mmol/L (ref 135–145)
TOTAL PROTEIN: 5.6 g/dL — AB (ref 6.5–8.1)

## 2017-09-24 LAB — CBC WITH DIFFERENTIAL/PLATELET
BASOS PCT: 0 %
Basophils Absolute: 0.1 10*3/uL (ref 0.0–0.1)
Eosinophils Absolute: 0.1 10*3/uL (ref 0.0–0.7)
Eosinophils Relative: 1 %
HEMATOCRIT: 32 % — AB (ref 36.0–46.0)
HEMOGLOBIN: 9.9 g/dL — AB (ref 12.0–15.0)
LYMPHS ABS: 1.2 10*3/uL (ref 0.7–4.0)
Lymphocytes Relative: 8 %
MCH: 29.8 pg (ref 26.0–34.0)
MCHC: 30.9 g/dL (ref 30.0–36.0)
MCV: 96.4 fL (ref 78.0–100.0)
MONOS PCT: 9 %
Monocytes Absolute: 1.3 10*3/uL — ABNORMAL HIGH (ref 0.1–1.0)
NEUTROS ABS: 12.4 10*3/uL — AB (ref 1.7–7.7)
NEUTROS PCT: 82 %
Platelets: 402 10*3/uL — ABNORMAL HIGH (ref 150–400)
RBC: 3.32 MIL/uL — ABNORMAL LOW (ref 3.87–5.11)
RDW: 16.6 % — ABNORMAL HIGH (ref 11.5–15.5)
WBC: 15 10*3/uL — ABNORMAL HIGH (ref 4.0–10.5)

## 2017-09-24 LAB — CULTURE, BLOOD (ROUTINE X 2)
CULTURE: NO GROWTH
Culture: NO GROWTH
Special Requests: ADEQUATE
Special Requests: ADEQUATE

## 2017-09-24 LAB — PHOSPHORUS: Phosphorus: 4.5 mg/dL (ref 2.5–4.6)

## 2017-09-24 LAB — MAGNESIUM: Magnesium: 2 mg/dL (ref 1.7–2.4)

## 2017-09-24 LAB — GLUCOSE, CAPILLARY
GLUCOSE-CAPILLARY: 110 mg/dL — AB (ref 65–99)
GLUCOSE-CAPILLARY: 87 mg/dL (ref 65–99)
Glucose-Capillary: 132 mg/dL — ABNORMAL HIGH (ref 65–99)
Glucose-Capillary: 123 mg/dL — ABNORMAL HIGH (ref 65–99)
Glucose-Capillary: 117 mg/dL — ABNORMAL HIGH (ref 65–99)
Glucose-Capillary: 101 mg/dL — ABNORMAL HIGH (ref 65–99)

## 2017-09-24 LAB — HEPARIN LEVEL (UNFRACTIONATED): Heparin Unfractionated: 0.26 IU/mL — ABNORMAL LOW (ref 0.30–0.70)

## 2017-09-24 MED ORDER — METOPROLOL TARTRATE 5 MG/5ML IV SOLN
INTRAVENOUS | Status: AC
  Filled 2017-09-24: qty 5

## 2017-09-24 MED ORDER — SODIUM CHLORIDE 0.9 % IV SOLN
250.0000 mg | Freq: Two times a day (BID) | INTRAVENOUS | Status: DC
  Administered 2017-09-25 – 2017-09-27 (×6): 250 mg via INTRAVENOUS
  Filled 2017-09-24 (×6): qty 2.5

## 2017-09-24 MED ORDER — METOPROLOL TARTRATE 5 MG/5ML IV SOLN
2.5000 mg | Freq: Four times a day (QID) | INTRAVENOUS | Status: DC
  Administered 2017-09-24 – 2017-09-27 (×10): 2.5 mg via INTRAVENOUS
  Filled 2017-09-24 (×9): qty 5

## 2017-09-24 NOTE — Progress Notes (Signed)
PROGRESS NOTE    Kayla Kent  UEA:[id number] DOB: [birth year] DOA: 09/19/2017 PCP: Biagio Borg, MD   Brief Narrative:  Kayla Kent a 83 y.o.femalewith medical history significant for stage III chronic kidney disease, dyslipidemia, chronic atrial fibrillation previously on Eliquis, hypertension, seizure disorder on Keppra, and recent outpatient fine-needle aspiration of thyroid nodule.  Patient was initially admitted on 11/20 secondary to acute upper GI bleeding Heme Positive as well as multidrug-resistant Klebsiella pneumonia.   On 11/25 patient was aphasic with respiratory distress and was later found to have an acute MCA stroke.  Her eliquis had been held due to the GI bleed.  11/25: underwent thrombolysis of left middle cerebral artery She was treated for aspiration pneumonia as well and was intubated. She was eventually discharged to CIR on 11/30 On 12/3 she was evaluated for fever and suspected to have continued Aspiration pneumonia and readmitted to the hospital. On 12/5 she underwent MBS again and failed and will continue TF via Cortrack.  12/6 IR Consulted for PEG Placement. Awaiting to be done and discussed with Dr. Laurence Ferrari of IR and states it may not be done until early next week. Per IR note likely to be done on Tuesday 09/27/17.   Assessment & Plan:   Active Problems:   Anxiety   Essential hypertension   Atrial fibrillation with RVR (HCC)   Acute renal failure (HCC)   Gastroesophageal reflux disease   CKD (chronic kidney disease) stage 3, GFR 30-59 ml/min (HCC)   Chronic atrial fibrillation (HCC)   Dyslipidemia   COPD (chronic obstructive pulmonary disease) (HCC)   Acute hypoxemic respiratory failure (HCC)   Cerebral thrombosis with cerebral infarction   Aspiration pneumonia due to gastric secretions (HCC)   Acute ischemic left MCA stroke (HCC)   Acute respiratory failure with hypoxia (HCC)   Dysphagia  Acute respiratory failure with hypoxia (HCC) 2/2  to Aspiration PNA - Leukocytosis went from 14.6 -> 16.7 -> 9.9 -> 13.1 -> 16.9 -> 15.0 - Possible Aspiration Pneumonia again- low grade fever yesterday with possible Hypoxia- now is 100% on 2 L O2 - Apparently completed 5 days of antibiotics on 11/25 - On last admission: sputum and blood cultures were negative & resp virus panel was Negative - Respiratory Virus Panel Negative - WBC worsened from 13.1 -> 16.9 but now improving 15.0 - Blood Cx x2 show NGTD at 5 Days - Now on Levaquin- will continue to follow; Will broaden Abx Covearge and add Vancomycin  -Will give one dose of IV Lasix 20 mg Once given component of CHF on CXR yesterday -Repeat CXR this AM showed mild improvement in overall lung aeration with a decrease in central vascular congestion and interstitial thickening and suggested improvement in pulmonary edema. There was persistent RUL consolidation reflecting PNA and no new abnormalities and COPD.   Recent CVA with Dysphagia - will return to CIR when stable - has been on IV Heparin at CIR- continue for now until PEG in Place - Cont tube feeds through Cortrack for now - Repeat MBS showed that patient did not pass  - IR consulted for PEG Placement. Patient and Family agreeable and currently awaiting to be done -Dietician evaluated and adjusted TF -IR evaluated today and PEG to be placed likely on Tuesday 09/27/17  A-Fib/ Flutter -Was not on rate controlling agent- cont Heparin for now and restart Eliquis when Appropriate through PEG Tube -C/w Liquid Metoprolol 25 mg BID through Cortrak   HTN (hypertension) -Current blood pressure  stable  -Hold previous Clonidine -Can utilize hydralazine IV prn for episodic hypertension -Started Patient on Metoprolol 25 mg BID  -Given one dose of IV Lasix yesterday with improvement   CKD (chronic kidney disease), stage III  -Renal function stable and at baseline -BUN/Cr was 15/0.63 -> 15/0.64  Seizure Disorder -Cont IV Keppra for Now  until able to through PEG when it is placed  GI bleed? -EGD was normal on 11/23  Thyroid nodule -Recent FNA of thyroid nodule with indeterminate pathology; testing ordered by PCP and patient can follow-up with this physician to discuss significance of findings  Dyslipidemia -Statin on hold until can reintroduce tube feedings through PEG  Leukocytosis, worsening  -WBC went from 9.9 -> 13.1 -> 16.7 -Patient on IV Levofloxacin for PNA; Will Broaden and add IV Vancomyucin -Repeat CBC in AM  Thrombocytosis -Likely Reactive -Platelet Count went from 412 -> 448 -> 383 -> 402 -Continue to Monitor and Repeat CBC in AM   ? Nonsustained VTACH -Electrolytes wNL with K+ at 4.5, Mag at 2.0, and  -C/w Telemetry -Add Metoprolol 25 mg po BID through Cortrak for now   Severe Malnutrition in Context of Chronic Illness -Nutritionist evaluated  - Decreased TF rate to Osmolite 1.2 @ 60 ml/hr (1440 ml daily) Providing, 1728 kcals, 80 g Pro, 1181 ml fluid + free water at 200 ml q 8 hrs -Awaiting PEG Placement   DVT prophylaxis: Anticoagulated with Heparin gtt for now and resume Apixaban once able  Code Status: Partial CODE; OK with Intubation no Shocks or Compressions Family Communication: No family present at bedside but updated daughter over the phone Disposition Plan: CIR when Medically Stable for D/C  Consultants:   Interventional Radiology   Procedures:  ECHOCARDIOGRAM Study Conclusions  - Left ventricle: The cavity size was normal. Wall thickness was   increased in a pattern of mild LVH. Systolic function was normal.   The estimated ejection fraction was in the range of 60% to 65%.   Wall motion was normal; there were no regional wall motion   abnormalities. - Mitral valve: There was mild regurgitation. - Left atrium: The atrium was severely dilated. - Right atrium: The atrium was mildly dilated. - Pulmonary arteries: Systolic pressure was mildly increased. PA   peak pressure:  33 mm Hg (S).   Antimicrobials:  Anti-infectives (From admission, onward)   Start     Dose/Rate Route Frequency Ordered Stop   09/24/17 0800  vancomycin (VANCOCIN) IVPB 750 mg/150 ml premix     750 mg 150 mL/hr over 60 Minutes Intravenous Every 24 hours 09/23/17 0817     09/23/17 0830  vancomycin (VANCOCIN) IVPB 750 mg/150 ml premix     750 mg 150 mL/hr over 60 Minutes Intravenous  Once 09/23/17 0807 09/23/17 1007   09/21/17 2200  levofloxacin (LEVAQUIN) IVPB 750 mg     750 mg 100 mL/hr over 90 Minutes Intravenous Every 48 hours 09/20/17 1030     09/20/17 0500  levofloxacin (LEVAQUIN) IVPB 500 mg  Status:  Discontinued     500 mg 100 mL/hr over 60 Minutes Intravenous Every 24 hours 09/19/17 1443 09/20/17 1030   09/19/17 1345  levofloxacin (LEVAQUIN) IVPB 750 mg  Status:  Discontinued     750 mg 100 mL/hr over 90 Minutes Intravenous Every 24 hours 09/19/17 1339 09/19/17 1444     Subjective: Seen and examined at beside and had no complaints and no pain. Was trying to talk. Understands the plan for PEG. No other  concerns or complaints and daughter updated via the phone.     Objective: Vitals:   09/24/17 1220 09/24/17 1300 09/24/17 1403 09/24/17 1633  BP: 122/79 (!) 131/48  (!) 106/91  Pulse: 94   83  Resp: (!) 23 20  19   Temp: 97.6 F (36.4 C)   (!) 97.5 F (36.4 C)  TempSrc: Oral   Oral  SpO2: 96%  97% 97%  Weight:      Height:        Intake/Output Summary (Last 24 hours) at 09/24/2017 1641 Last data filed at 09/24/2017 0529 Gross per 24 hour  Intake 1347.35 ml  Output 1150 ml  Net 197.35 ml   Filed Weights   09/23/17 0300 09/23/17 0500 09/24/17 0600  Weight: 44 kg (97 lb 0 oz) 45.6 kg (100 lb 8.5 oz) 45.1 kg (99 lb 6.8 oz)   Examination: Physical Exam:  Constitutional: Thin frail and cachectic appearing Caucasian female in NAD Eyes: Sclerae anicteric. Lids normal ENMT: External Ears normal. Cortrak in place. MMM Neck: Supple with no JVD Respiratory: Diminished  to auscultation with rhonchi in RUL and some mild crackles. No real wheezing noted on exam and unlabored breathing Cardiovascular: Irregular Rhythm but normal rate. No extremity edema Abdomen: Soft, NT, ND. Bowel sounds present. No appreciable masses GU: Deferred Musculoskeletal: No contractures. No cyanosis Skin: No appreciable rashes or lesions on a limited skin eval Neurologic: Paitient is aphasic with noted Right Arm weakness. No decreased sensation in LE Psychiatric: Awake and alert. Normal mood and affect  Data Reviewed: I have personally reviewed following labs and imaging studies  CBC: Recent Labs  Lab 09/19/17 0138 09/20/17 0137 09/21/17 0420 09/22/17 0652 09/23/17 0445 09/24/17 0244  WBC 14.6* 16.7* 9.9 13.1* 16.9* 15.0*  NEUTROABS 11.4*  --   --  10.5* 13.7* 12.4*  HGB 9.1* 9.4* 8.8* 9.5* 9.8* 9.9*  HCT 28.4* 29.3* 27.7* 29.7* 32.1* 32.0*  MCV 95.0 94.5 95.8 96.4 98.5 96.4  PLT 417* 434* 412* 448* 383 096*   Basic Metabolic Panel: Recent Labs  Lab 09/19/17 0407 09/21/17 0854 09/22/17 0652 09/23/17 0445 09/24/17 0244  NA 137 136 137 137 137  K 4.2 4.0 4.0 4.3 4.5  CL 103 100* 103 103 100*  CO2 26 26 25 23 28   GLUCOSE 109* 144* 127* 114* 113*  BUN 19 16 16 15 15   CREATININE 0.72 0.66 0.65 0.63 0.64  CALCIUM 8.1* 7.7* 8.1* 8.3* 8.3*  MG  --  2.0 2.0 2.0 2.0  PHOS  --  3.1 2.7 3.7 4.5   GFR: Estimated Creatinine Clearance: 37.9 mL/min (by C-G formula based on SCr of 0.64 mg/dL). Liver Function Tests: Recent Labs  Lab 09/21/17 0854 09/22/17 0652 09/23/17 0445 09/24/17 0244  AST 36 29 32 43*  ALT 50 41 39 49  ALKPHOS 95 85 91 86  BILITOT 0.5 0.5 0.5 0.3  PROT 5.2* 5.1* 5.5* 5.6*  ALBUMIN 2.1* 2.1* 2.3* 2.2*   No results for input(s): LIPASE, AMYLASE in the last 168 hours. No results for input(s): AMMONIA in the last 168 hours. Coagulation Profile: No results for input(s): INR, PROTIME in the last 168 hours. Cardiac Enzymes: Recent Labs  Lab  09/19/17 0743 09/19/17 1245 09/19/17 1418 09/19/17 1922 09/20/17 0137  TROPONINI 0.04* 0.03* 0.04* 0.04* 0.04*   BNP (last 3 results) Recent Labs    10/22/16 1135  PROBNP 410.0*   HbA1C: No results for input(s): HGBA1C in the last 72 hours. CBG: Recent Labs  Lab  09/23/17 2232 09/24/17 0322 09/24/17 0730 09/24/17 1215 09/24/17 1632  GLUCAP 135* 110* 132* 123* 117*   Lipid Profile: No results for input(s): CHOL, HDL, LDLCALC, TRIG, CHOLHDL, LDLDIRECT in the last 72 hours. Thyroid Function Tests: No results for input(s): TSH, T4TOTAL, FREET4, T3FREE, THYROIDAB in the last 72 hours. Anemia Panel: No results for input(s): VITAMINB12, FOLATE, FERRITIN, TIBC, IRON, RETICCTPCT in the last 72 hours. Sepsis Labs: Recent Labs  Lab 09/19/17 0407 09/19/17 0743  PROCALCITON  --  0.13  LATICACIDVEN 1.6 1.3    Recent Results (from the past 240 hour(s))  Culture, blood (Routine X 2) w Reflex to ID Panel     Status: None   Collection Time: 09/19/17  7:35 AM  Result Value Ref Range Status   Specimen Description BLOOD LEFT HAND  Final   Special Requests IN PEDIATRIC BOTTLE Blood Culture adequate volume  Final   Culture NO GROWTH 5 DAYS  Final   Report Status 09/24/2017 FINAL  Final  Culture, blood (Routine X 2) w Reflex to ID Panel     Status: None   Collection Time: 09/19/17  7:40 AM  Result Value Ref Range Status   Specimen Description BLOOD LEFT HAND  Final   Special Requests IN PEDIATRIC BOTTLE Blood Culture adequate volume  Final   Culture NO GROWTH 5 DAYS  Final   Report Status 09/24/2017 FINAL  Final  Respiratory Panel by PCR     Status: None   Collection Time: 09/19/17  2:17 PM  Result Value Ref Range Status   Adenovirus NOT DETECTED NOT DETECTED Final   Coronavirus 229E NOT DETECTED NOT DETECTED Final   Coronavirus HKU1 NOT DETECTED NOT DETECTED Final   Coronavirus NL63 NOT DETECTED NOT DETECTED Final   Coronavirus OC43 NOT DETECTED NOT DETECTED Final    Metapneumovirus NOT DETECTED NOT DETECTED Final   Rhinovirus / Enterovirus NOT DETECTED NOT DETECTED Final   Influenza A NOT DETECTED NOT DETECTED Final   Influenza B NOT DETECTED NOT DETECTED Final   Parainfluenza Virus 1 NOT DETECTED NOT DETECTED Final   Parainfluenza Virus 2 NOT DETECTED NOT DETECTED Final   Parainfluenza Virus 3 NOT DETECTED NOT DETECTED Final   Parainfluenza Virus 4 NOT DETECTED NOT DETECTED Final   Respiratory Syncytial Virus NOT DETECTED NOT DETECTED Final   Bordetella pertussis NOT DETECTED NOT DETECTED Final   Chlamydophila pneumoniae NOT DETECTED NOT DETECTED Final   Mycoplasma pneumoniae NOT DETECTED NOT DETECTED Final  Culture, respiratory (NON-Expectorated)     Status: None (Preliminary result)   Collection Time: 09/19/17  3:43 PM  Result Value Ref Range Status   Specimen Description TRACHEAL ASPIRATE  Final   Special Requests NONE  Final   Gram Stain   Final    RARE WBC PRESENT, PREDOMINANTLY PMN RARE YEAST RARE GRAM POSITIVE COCCI IN PAIRS RARE SQUAMOUS EPITHELIAL CELLS PRESENT    Culture PENDING  Incomplete   Report Status PENDING  Incomplete  Culture, Urine     Status: None   Collection Time: 09/19/17  5:21 PM  Result Value Ref Range Status   Specimen Description URINE, RANDOM  Final   Special Requests NONE  Final   Culture NO GROWTH  Final   Report Status 09/20/2017 FINAL  Final    Radiology Studies: Dg Chest Port 1 View  Result Date: 09/24/2017 CLINICAL DATA:  Shortness of breath; hx of HTN and emphysema EXAM: PORTABLE CHEST 1 VIEW COMPARISON:  09/23/2017 FINDINGS: Right upper lobe confluent airspace lung opacity  is similar to the previous day's study. Hazy type right lower lung opacity is also stable. Lungs are hyperexpanded. There is mild interstitial thickening most evident in the bases, and scarring/atelectasis in the right lateral lung base. Interstitial thickening and central vascular congestion appears improved from the previous day's  study. No new lung abnormalities. No definite pleural effusion.  No pneumothorax. Enteric feeding tube passes below the diaphragm, unchanged. IMPRESSION: 1. Mild improvement in overall lung aeration with a decrease in central vascular congestion and interstitial thickening. This suggests improved pulmonary edema. 2. Persistent right upper lobe consolidation, which suggests pneumonia. 3. No new abnormalities. 4. COPD. Electronically Signed   By: Lajean Manes M.D.   On: 09/24/2017 09:18   Dg Chest Port 1 View  Result Date: 09/23/2017 CLINICAL DATA:  Shortness of breath. EXAM: PORTABLE CHEST 1 VIEW COMPARISON:  09/21/2017 . FINDINGS: Feeding tube noted with tip below left hemidiaphragm. Stable cardiomegaly. Persistent right upper lobe infiltrate. Increased interstitial markings noted bilaterally. Component congestive heart failure cannot be excluded. Small bilateral pleural effusions. No pneumothorax . IMPRESSION: 1. Feeding tube noted with tip below left hemidiaphragm. 2. Persistent right upper lobe infiltrate consistent with pneumonia. Component of atelectasis also present. 3. Cardiomegaly. Increased pulmonary interstitial prominence and small bilateral pleural effusions. Findings suggest a component of CHF. Electronically Signed   By: Marcello Moores  Register   On: 09/23/2017 08:37   Scheduled Meds: . aspirin  300 mg Rectal Daily  . budesonide (PULMICORT) nebulizer solution  0.25 mg Nebulization BID  . chlorhexidine  15 mL Mouth Rinse BID  . free water  200 mL Per Tube Q8H  . ipratropium-albuterol  3 mL Nebulization TID  . levETIRAcetam  250 mg Per Tube BID  . mouth rinse  15 mL Mouth Rinse BID  . metoprolol tartrate  25 mg Per Tube BID  . pantoprazole (PROTONIX) IV  40 mg Intravenous Q12H   Continuous Infusions: . feeding supplement (OSMOLITE 1.2 CAL) 1,000 mL (09/24/17 1330)  . heparin 900 Units/hr (09/24/17 1046)  . levofloxacin (LEVAQUIN) IV Stopped (09/23/17 2350)  . vancomycin Stopped (09/24/17  1015)    LOS: 5 days   Kerney Elbe, DO [facility] Pager [phone]  If 7PM-7AM, please contact night-coverage [email or web address] Password [facility] 09/24/2017, 4:41 PM

## 2017-09-24 NOTE — Consult Note (Signed)
Chief Complaint: Patient was seen in consultation today for dysphagia  Referring Physician(s): Dr. Alfredia Ferguson  Supervising Physician: Jacqulynn Cadet  Patient Status: [facility] - In-pt  History of Present Illness: Kayla Kent is a 83 y.o. female with history of CVA, known to NIR from cerebral intervention who has been recovering from residual effects of her stroke.  She has been working with SLP in rehab for diet advancement but unfortunately developed pneumonia presumed to be aspiration pneumonia.  She subsequently failed an MBS.  IR consulted for percutaneous gastrostomy placement at the request of Dr. Zollie Scale.   Case reviewed by Dr. Laurence Ferrari who has assessed recent abdominal imaging and feels percutaneous approach is feasible.   Past Medical History:  Diagnosis Date  . Allergic rhinitis   . Anxiety   . CKD (chronic kidney disease) stage 3, GFR 30-59 ml/min (HCC)   . Emphysema of lung ([place])   . HLD (hyperlipidemia)   . HTN (hypertension)   . Intolerance of drug    orthostatic  . Paroxysmal supraventricular tachycardia ([place])   . PVD (peripheral vascular disease) ([place])   . Seizure ([place])   . Syncope and collapse   . Uterine prolapse without mention of vaginal wall prolapse     Past Surgical History:  Procedure Laterality Date  . CARDIOVERSION N/A 11/05/2014   Procedure: CARDIOVERSION;  Surgeon: Candee Furbish, MD;  Location: [facility] ENDOSCOPY;  Service: Cardiovascular;  Laterality: N/A;  . cataract surgery  08/2015  . CHOLECYSTECTOMY    . corrective eye surgery     as a child  . ESOPHAGOGASTRODUODENOSCOPY N/A 09/09/2017   Procedure: ESOPHAGOGASTRODUODENOSCOPY (EGD);  Surgeon: Mauri Pole, MD;  Location: [facility] ENDOSCOPY;  Service: Endoscopy;  Laterality: N/A;  . INTRAMEDULLARY (IM) NAIL INTERTROCHANTERIC Left 11/21/2016   Procedure: INTRAMEDULLARY (IM) NAIL INTERTROCHANTRIC HEMI;  Surgeon: Newt Minion, MD;  Location: [place];  Service: Orthopedics;  Laterality: Left;  . IR  PERCUTANEOUS ART THROMBECTOMY/INFUSION INTRACRANIAL INC DIAG ANGIO  09/11/2017  . IVD removed    . OPEN REDUCTION INTERNAL FIXATION (ORIF) DISTAL RADIAL FRACTURE Right 08/29/2014   Procedure: OPEN REDUCTION INTERNAL FIXATION (ORIF) DISTAL RADIAL FRACTURE;  Surgeon: Marianna Payment, MD;  Location: [place];  Service: Orthopedics;  Laterality: Right;  . RADIOLOGY WITH ANESTHESIA N/A 09/11/2017   Procedure: RADIOLOGY WITH ANESTHESIA;  Surgeon: Luanne Bras, MD;  Location: [place];  Service: Radiology;  Laterality: N/A;  . RF ablation PSVT     summer '10  . stress cardiolite  08/05/93  . TONSILLECTOMY    . TOTAL HIP ARTHROPLASTY Right 08/29/2014   Procedure: Right Hip Hemi Arthroplasty;  Surgeon: Marianna Payment, MD;  Location: [place];  Service: Orthopedics;  Laterality: Right;  Hip procedure 1st wants [employer], [employer], [place].     Allergies: Chocolate; Codeine; Diazepam; Fruit & vegetable daily [nutritional supplements]; Iohexol; Latex; Peach [prunus persica]; Peanut-containing drug products; Penicillins; Strawberry extract; Sulfonamide derivatives; Wheat; Wheat bran; Sulfamethoxazole; Crestor [rosuvastatin calcium]; and Iodine  Medications: Prior to Admission medications   Medication Sig Start Date End Date Taking? Authorizing Provider  acetaminophen (TYLENOL) 325 MG tablet Take 2 tablets (650 mg total) by mouth every 4 (four) hours as needed for mild pain (or temp > 37.5 C (99.5 F)). 09/16/17  Yes Erick Colace, NP  aspirin 300 MG suppository Place 1 suppository (300 mg total) rectally daily. 09/17/17  Yes Erick Colace, NP  atorvastatin (LIPITOR) 20 MG tablet Take 1 tablet (20 mg total) by  mouth daily at 6 PM. 09/16/17  Yes Erick Colace, NP  bisacodyl (DULCOLAX) 10 MG suppository Place 1 suppository (10 mg total) rectally daily as needed for moderate constipation. 09/16/17  Yes Erick Colace, NP  chlorhexidine gluconate, MEDLINE KIT, (PERIDEX) 0.12 % solution 15  mLs by Mouth Rinse route 2 (two) times daily. 09/16/17  Yes Erick Colace, NP  heparin 100-0.45 UNIT/ML-% infusion Inject 900 Units/hr into the vein continuous. 09/16/17  Yes Erick Colace, NP  ipratropium-albuterol (DUONEB) 0.5-2.5 (3) MG/3ML SOLN Take 3 mLs by nebulization every 4 (four) hours as needed. 09/16/17  Yes Erick Colace, NP  levETIRAcetam (KEPPRA) 100 MG/ML solution Take 2.5 mLs (250 mg total) by mouth 2 (two) times daily. 09/16/17  Yes Erick Colace, NP  Nutritional Supplements (FEEDING SUPPLEMENT, OSMOLITE 1.2 CAL,) LIQD Place 1,000 mLs into feeding tube continuous. 09/16/17  Yes Erick Colace, NP  pantoprazole sodium (PROTONIX) 40 mg/20 mL PACK Place 20 mLs (40 mg total) into feeding tube daily at 12 noon. 09/17/17  Yes Erick Colace, NP  senna-docusate (SENOKOT-S) 8.6-50 MG tablet 1 tablet by Per NG tube route at bedtime. 09/16/17  Yes Erick Colace, NP  sodium chloride (OCEAN) 0.65 % SOLN nasal spray Place 1 spray into both nostrils as needed for congestion. 09/16/17  Yes Erick Colace, NP  sodium chloride 0.9 % infusion Inject 250 mLs into the vein as needed (if IV carrier fluid needed.). 09/16/17  Yes Erick Colace, NP     Family History  Problem Relation Age of Onset  . Mental illness Father        suicide  . Arthritis Father   . Hyperlipidemia Sister   . Hypertension Sister   . Heart disease Brother        CAD/MI  . Hypertension Brother   . Hypertension Unknown        family hx  . Colon cancer Neg Hx   . Breast cancer Neg Hx   . Diabetes Neg Hx   . Stroke Neg Hx   . Cancer Neg Hx   . Lung disease Neg Hx     Social History   Socioeconomic History  . Marital status: Widowed    Spouse name: None  . Number of children: 2  . Years of education: 29  . Highest education level: None  Social Needs  . Financial resource strain: None  . Food insecurity - worry: None  . Food insecurity - inability: None  . Transportation needs - medical:  None  . Transportation needs - non-medical: None  Occupational History  . Occupation: retired Radio producer  Tobacco Use  . Smoking status: Former Smoker    Packs/day: 0.25    Years: 38.00    Pack years: 9.50    Start date: 02/15/1952    Last attempt to quit: 10/18/1993    Years since quitting: 23.9  . Smokeless tobacco: Never Used  Substance and Sexual Activity  . Alcohol use: No    Alcohol/week: 0.0 oz  . Drug use: No  . Sexual activity: No  Other Topics Concern  . None  Social History Narrative   [employer], [employer]-BA, [employer]-early childhood. Married '55 -29 years.  1 son - 71; 1 daughter - 94; 3 grandchildren . Lives alone. ACP - discussed and provided packet on end of life care (Feb '13)   Patient is now widowed.   Patient is right-handed.   Patient drinks tea daily.  [facility] (04/26/17):   Originally from [facility]. Previously was a Pharmacist, hospital. No pets currently. No mold exposure. No bird exposure.    Review of Systems  Constitutional: Negative for fatigue and fever.  Respiratory: Positive for cough and shortness of breath.   Cardiovascular: Negative for chest pain.  Gastrointestinal: Negative for abdominal pain.  Psychiatric/Behavioral: Negative for behavioral problems and confusion.    Vital Signs: BP (!) 131/48   Pulse 94   Temp 97.6 F (36.4 C) (Oral)   Resp 20   Ht 5' 7"  (1.702 m)   Wt 99 lb 6.8 oz (45.1 kg)   SpO2 97%   BMI 15.57 kg/m   Physical Exam  Constitutional: She is oriented to person, place, and time. She appears well-developed.  Cardiovascular: Normal rate, regular rhythm and normal heart sounds.  Pulmonary/Chest: Effort normal and breath sounds normal. No respiratory distress.  Abdominal: Soft.  Neurological: She is alert and oriented to person, place, and time.  Skin: Skin is warm and dry.  Psychiatric: She has a normal mood and affect. Her behavior is normal. Judgment and thought content normal.  Nursing note and vitals  reviewed.   Imaging: Dg Chest 1 View  Result Date: 09/21/2017 CLINICAL DATA:  Shortness of breath and chest congestion. Images were obtained following a swallowing function radiograph. EXAM: CHEST 1 VIEW COMPARISON:  PA and lateral chest x-ray of September 20, 2017 FINDINGS: There is confluent airspace opacity in the inferior aspect of the right upper lobe. This has not appreciably changed since yesterday's study. The lung markings remain coarse in the retrocardiac region on the left with obscuration of the hemidiaphragm. The right infrahilar lung markings are mildly increased. There small bilateral pleural effusions greatest on the left. The heart is top-normal in size. The pulmonary vascularity is not engorged. The feeding tube tip projects below the inferior margin of the image. There is calcification in the wall of the thoracic aorta. IMPRESSION: Right upper lobe pneumonia. Probable left lower lobe pneumonia and right infrahilar atelectasis. When compared to yesterday's study there has not been dramatic interval change. Thoracic aortic atherosclerosis. Electronically Signed   By: David  Martinique M.D.   On: 09/21/2017 14:24   Dg Chest 2 View  Result Date: 09/20/2017 CLINICAL DATA:  Acute respiratory failure, hypoxia, syncope EXAM: CHEST  2 VIEW COMPARISON:  Portable chest x-ray of 12 3 and 09/17/2017 FINDINGS: Opacities at both lung bases and in the right upper lobe persists mild consistent with multifocal pneumonia. Small effusions layer posteriorly. There does appear to have been some improvement in mild pulmonary vascular congestion. Cardiomegaly is stable. NG tube extends below the hemidiaphragm. IMPRESSION: 1. Persistent bibasilar opacities as well as right upper lobe opacity most consistent with multifocal pneumonia with small pleural effusions. 2. Some improvement in mild pulmonary vascular congestion. Electronically Signed   By: Ivar Drape M.D.   On: 09/20/2017 08:43   Dg Chest 2 View  Result  Date: 09/06/2017 CLINICAL DATA:  Dizziness.  Vomiting.  Third mental status . EXAM: CHEST  2 VIEW COMPARISON:  CT 08/23/2017. PET-CT 05/25/2017. Chest x-ray 04/14/2017. FINDINGS: Cardiomegaly with increase interstitial prominence bilaterally, particular on the left. Small bilateral pleural effusions cannot be excluded. Findings suggest CHF. Bilateral pneumonia cannot be excluded. Reference is made to prior PET-CT report of 05/25/2017. IMPRESSION: Findings consistent with congestive heart failure with bilateral from interstitial edema. Small bilateral pleural effusions cannot be excluded. Electronically Signed   By: Marcello Moores  Register   On: 09/06/2017 13:36  Ct Head Wo Contrast  Result Date: 09/11/2017 CLINICAL DATA:  Status post stroke intervention. EXAM: CT HEAD WITHOUT CONTRAST TECHNIQUE: Contiguous axial images were obtained from the base of the skull through the vertex without intravenous contrast. COMPARISON:  Head CT 09/11/2017 Cervical angiogram 09/11/2017 FINDINGS: Brain: There is a small amount of petechial hemorrhage versus contrast staining of the medial aspect of the left frontal operculum, coronal image 38. There is no intraparenchymal hematoma. No mass effect. No extra-axial collection. Anterior right temporal lobe encephalomalacia is unchanged. There is periventricular hypoattenuation compatible with chronic microvascular disease. No hydrocephalus. Vascular: Intravascular hyperattenuation is likely persisting contrast material. Skull: Normal visualized skull base, calvarium and extracranial soft tissues. Sinuses/Orbits: No sinus fluid levels or advanced mucosal thickening. No mastoid effusion. Normal orbits. IMPRESSION: 1. Small focus of contrast staining versus petechial hemorrhage at the medial aspect of the left frontal operculum. 2. No intraparenchymal hematoma, mass effect or hydrocephalus. Electronically Signed   By: Ulyses Jarred M.D.   On: 09/11/2017 23:42   Ct Head Wo  Contrast  Result Date: 09/06/2017 CLINICAL DATA:  Altered mental status with questionable fall. Slurred speech. EXAM: CT HEAD WITHOUT CONTRAST TECHNIQUE: Contiguous axial images were obtained from the base of the skull through the vertex without intravenous contrast. COMPARISON:  April 06, 2017 head CT; brain MRI September 23, 2014 FINDINGS: Brain: There is mild diffuse atrophy. There is no intracranial mass, hemorrhage, extra-axial fluid collection, or midline shift. There is chronic right temporal lobe encephalomalacia, stable. There is a prior infarct involving the head of the caudate nucleus on the right. There is patchy small vessel disease in the centra semiovale bilaterally, stable. No acute infarct demonstrable on this study. Vascular: No hyperdense vessel. There is calcification in each carotid siphon and distal vertebral artery. Skull: The bony calvarium appears intact. Small bony enostosis arising from the left frontal bone. There is evidence of old trauma with remodeling involving the left mandibular condyle with osteoarthritic change in this area. Sinuses/Orbits: There is mucosal thickening in several ethmoid air cells. Other visualized paranasal sinuses are clear. Orbits appear symmetric bilaterally except for evidence of cataract removal on the left. Other: There is chronic opacification of several inferior mastoid air cells. Most mastoid air cells bilaterally are clear. IMPRESSION: 1. Atrophy with periventricular small vessel disease, stable. Chronic encephalomalacia right temporal lobe, stable. 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 No new gray-white compartment lesion. No acute infarct. 2. No appreciable mass, hemorrhage, or extra-axial fluid collection. 3.  Foci of arterial vascular calcification evident. 4. Areas of paranasal sinus disease. Mild opacification of several inferior mastoid air cells bilaterally, stable. Electronically Signed   By: Lowella Grip III M.D.   On:  09/06/2017 12:01   Ct Chest Wo Contrast  Result Date: 09/11/2017 CLINICAL DATA:  83 year old female with history of shortness of breath. EXAM: CT CHEST WITHOUT CONTRAST TECHNIQUE: Multidetector CT imaging of the chest was performed following the standard protocol without IV contrast. COMPARISON:  Chest CT 08/23/2017. FINDINGS: Cardiovascular: Heart size is mildly enlarged. There is no significant pericardial fluid, thickening or pericardial calcification. There is aortic atherosclerosis, as well as atherosclerosis of the great vessels of the mediastinum and the coronary arteries, including calcified atherosclerotic plaque in the left main, left anterior descending, left circumflex and right coronary arteries. Mediastinum/Nodes: No pathologically enlarged mediastinal or hilar lymph nodes. Please note that accurate exclusion of hilar adenopathy is limited on noncontrast CT scans. Esophagus is unremarkable in appearance. No axillary lymphadenopathy. Lungs/Pleura:  Multifocal asymmetrically distributed airspace consolidation throughout the lungs bilaterally, concerning for multilobar pneumonia. Previously noted right upper lobe pulmonary nodule is now largely obscured by adjacent airspace disease and is not readily identifiable. Moderate bilateral pleural effusions. Near complete atelectasis of the left lower lobe with some additional dependent atelectasis in the right lower lobe. Upper Abdomen: Aortic atherosclerosis. Musculoskeletal: There are no aggressive appearing lytic or blastic lesions noted in the visualized portions of the skeleton. IMPRESSION: 1. Findings are most compatible with severe multilobar bronchopneumonia. 2. Moderate bilateral pleural effusions with areas of dependent atelectasis in the lower lobes of lungs bilaterally. 3. Cardiomegaly. 4. Aortic atherosclerosis, in addition to left main and 3 vessel coronary artery disease. 5. Previously treated right upper lobe pulmonary nodule is now obscured  by overlying airspace disease. Attention on followup studies is recommended. Aortic Atherosclerosis (ICD10-I70.0). Electronically Signed   By: Vinnie Langton M.D.   On: 09/11/2017 11:06   Mr Brain Wo Contrast  Result Date: 09/13/2017 CLINICAL DATA:  Stroke follow-up.  Aphasia and right-sided weakness. EXAM: MRI HEAD WITHOUT CONTRAST TECHNIQUE: Multiplanar, multiecho pulse sequences of the brain and surrounding structures were obtained without intravenous contrast. COMPARISON:  Head CT 09/11/2017 FINDINGS: Brain: The midline structures are normal. There is diffusion restriction predominantly within the left precentral gyrus but also extending into the postcentral gyrus. Encephalomalacia is again noted at the right temporal pole. There is cytotoxic edema within the left frontal operculum. Scattered areas of white matter hyperintensity consistent with chronic microvascular disease. No hemorrhage. No age-advanced or lobar predominant atrophy. No chronic microhemorrhage or superficial siderosis. Vascular: Major intracranial arterial and venous sinus flow voids are preserved. Skull and upper cervical spine: The visualized skull base, calvarium, upper cervical spine and extracranial soft tissues are normal. Sinuses/Orbits: No fluid levels or advanced mucosal thickening. No mastoid or middle ear effusion. Normal orbits. IMPRESSION: 1. Acute ischemic infarct within the left hemisphere, predominantly located within the left precentral gyrus and frontal operculum, but also involving the left postcentral gyrus. 2. No hemorrhage or mass effect. 3. Findings of chronic microvascular disease. Electronically Signed   By: Ulyses Jarred M.D.   On: 09/13/2017 05:54   Dg Chest Port 1 View  Result Date: 09/24/2017 CLINICAL DATA:  Shortness of breath; hx of HTN and emphysema EXAM: PORTABLE CHEST 1 VIEW COMPARISON:  09/23/2017 FINDINGS: Right upper lobe confluent airspace lung opacity is similar to the previous day's study. Hazy  type right lower lung opacity is also stable. Lungs are hyperexpanded. There is mild interstitial thickening most evident in the bases, and scarring/atelectasis in the right lateral lung base. Interstitial thickening and central vascular congestion appears improved from the previous day's study. No new lung abnormalities. No definite pleural effusion.  No pneumothorax. Enteric feeding tube passes below the diaphragm, unchanged. IMPRESSION: 1. Mild improvement in overall lung aeration with a decrease in central vascular congestion and interstitial thickening. This suggests improved pulmonary edema. 2. Persistent right upper lobe consolidation, which suggests pneumonia. 3. No new abnormalities. 4. COPD. Electronically Signed   By: Lajean Manes M.D.   On: 09/24/2017 09:18   Dg Chest Port 1 View  Result Date: 09/23/2017 CLINICAL DATA:  Shortness of breath. EXAM: PORTABLE CHEST 1 VIEW COMPARISON:  09/21/2017 . FINDINGS: Feeding tube noted with tip below left hemidiaphragm. Stable cardiomegaly. Persistent right upper lobe infiltrate. Increased interstitial markings noted bilaterally. Component congestive heart failure cannot be excluded. Small bilateral pleural effusions. No pneumothorax . IMPRESSION: 1. Feeding tube noted with tip below left  hemidiaphragm. 2. Persistent right upper lobe infiltrate consistent with pneumonia. Component of atelectasis also present. 3. Cardiomegaly. Increased pulmonary interstitial prominence and small bilateral pleural effusions. Findings suggest a component of CHF. Electronically Signed   By: Marcello Moores  Register   On: 09/23/2017 08:37   Dg Chest Port 1 View  Result Date: 09/19/2017 CLINICAL DATA:  Shortness of breath. EXAM: PORTABLE CHEST 1 VIEW COMPARISON:  09/17/2017 FINDINGS: Enteric catheter descends to the abdomen, collimated off the image. The cardiac silhouette is enlarged. Calcific atherosclerotic disease of the aorta. No evidence of pneumothorax. Bilateral small pleural  effusions. Possible left lower lobe airspace consolidation. Ground-glass opacities first nodules in the right lower and right upper lobes. Hyperinflation of the lungs. Osseous structures are without acute abnormality. Soft tissues are grossly normal. IMPRESSION: Bilateral small pleural effusions with patchy left lower lobe airspace consolidation and pulmonary nodules versus areas of ground-glass airspace consolidation in the right upper and right lower lobes. Electronically Signed   By: Fidela Salisbury M.D.   On: 09/19/2017 01:44   Dg Chest Port 1 View  Result Date: 09/17/2017 CLINICAL DATA:  Respiratory failure EXAM: PORTABLE CHEST 1 VIEW COMPARISON:  09/16/2017 FINDINGS: There is hyperinflation of the lungs compatible with COPD. Cardiomegaly. Patchy bilateral airspace disease again noted, not significantly changed. Areas of consolidation most pronounced in the left base and right upper lobe. Small effusions. IMPRESSION: COPD.  Cardiomegaly. Patchy bilateral airspace disease and small effusions, unchanged. Findings concerning for multifocal pneumonia. Electronically Signed   By: Rolm Baptise M.D.   On: 09/17/2017 07:39   Dg Chest Portable 1 View  Result Date: 09/16/2017 CLINICAL DATA:  Aspiration pneumonia EXAM: PORTABLE CHEST 1 VIEW COMPARISON:  09/15/2017 FINDINGS: Patchy bilateral airspace opacities are again noted superimposed on COPD. Cardiomegaly. No change since prior study. Small effusions, left greater than right. IMPRESSION: Attending patchy bilateral airspace disease and small effusions. COPD.  Cardiomegaly. No change. Electronically Signed   By: Rolm Baptise M.D.   On: 09/16/2017 09:42   Dg Chest Port 1 View  Result Date: 09/15/2017 CLINICAL DATA:  83 year old female status post left MCA emergent large vessel occlusion and endovascular revascularization. EXAM: PORTABLE CHEST 1 VIEW COMPARISON:  09/13/2017 and earlier. FINDINGS: Portable AP upright view at 1306 hours. Extubated. NG type  tube has been removed and an enteric feeding tube courses to the abdomen, tip not occluded. Mildly lower lung volumes. Mildly increased bilateral perihilar opacity, most apparent in the right upper lung. Continued dense opacification at the left lung base. Stable cardiac size and mediastinal contours. Calcified aortic atherosclerosis. Stable visualized osseous structures. IMPRESSION: 1. Extubated.  NG tube removed and feeding tube placed. 2. Lower lung volumes with increasing bilateral perihilar opacity. Consider mild or developing pulmonary edema. 3. Continued dense left lung base opacity compatible with a combination of pleural effusion and lung base collapse/consolidation. Electronically Signed   By: Genevie Ann M.D.   On: 09/15/2017 13:42   Dg Chest Port 1 View  Result Date: 09/13/2017 CLINICAL DATA:  83 year old female status post left MCA emergent large vessel occlusion and endovascular revascularization. EXAM: PORTABLE CHEST 1 VIEW COMPARISON:  Portable chest 09/11/2017 and earlier. FINDINGS: Portable AP semi upright view at 0614 hours. Stable endotracheal tube tip just below the level the clavicles. Enteric tube courses to the abdomen, tip not included. Large lung volumes with coarse bilateral pulmonary interstitial opacity bilateral perihilar opacity has mildly regressed since 09/11/2017. Stable confluent left lung base opacity with obscuration of the left hemidiaphragm. No pneumothorax.  No definite right pleural effusion. Stable cardiac size and mediastinal contours. Calcified aortic atherosclerosis. IMPRESSION: 1.  Stable lines and tubes. 2. Continued left pleural effusion with associated left lung base collapse or consolidation. 3. Coarse bilateral pulmonary interstitial opacity but regression of indistinct perihilar opacity (perhaps perihilar pulmonary edema) since 09/11/2017. Electronically Signed   By: Genevie Ann M.D.   On: 09/13/2017 07:40   Dg Chest Port 1 View  Result Date: 09/12/2017 CLINICAL  DATA:  Endotracheal and OG tube placements EXAM: PORTABLE CHEST 1 VIEW COMPARISON:  09/11/2017 FINDINGS: Interval placement of an endotracheal tube with tip measuring 4.5 cm above the carina. An enteric tube has been placed. The tip is off the field of view but is well below the left hemidiaphragm. Heart size is normal. Again, there is diffuse bilateral airspace disease with a mostly perihilar distribution. Probable left pleural effusion. No pneumothorax. Calcification of the aorta. IMPRESSION: Appliances appear in satisfactory position. Again, there is diffuse bilateral pulmonary infiltration with small left pleural effusion. Changes likely represent multifocal pneumonia, ARDS, or edema. Electronically Signed   By: Lucienne Capers M.D.   On: 09/12/2017 00:20   Dg Chest Port 1 View  Result Date: 09/11/2017 CLINICAL DATA:  83 year old female with history of fever for 1 day. EXAM: PORTABLE CHEST 1 VIEW COMPARISON:  Chest x-ray 09/07/2017. FINDINGS: Decreasing lung volumes with worsening aeration throughout the mid to lower lungs bilaterally, with air bronchograms evident in the right lower lobe, concerning for progressive multilobar pneumonia. Small right and moderate left pleural effusions. Pulmonary vasculature is obscured. No pneumothorax. Cardiomegaly. The patient is rotated to the left on today's exam, resulting in distortion of the mediastinal contours and reduced diagnostic sensitivity and specificity for mediastinal pathology. Atherosclerosis in the thoracic aorta. IMPRESSION: 1. Findings are compatible with worsening severe multilobar pneumonia, most evident throughout the mid to lower lungs bilaterally. 2. Small right and moderate left parapneumonic pleural effusions. 3. Cardiomegaly. 4. Aortic atherosclerosis. Electronically Signed   By: Vinnie Langton M.D.   On: 09/11/2017 08:09   Dg Chest Port 1 View  Result Date: 09/07/2017 CLINICAL DATA:  Rapid atrial fibrillation. EXAM: PORTABLE CHEST 1  VIEW COMPARISON:  Chest x-ray from yesterday. FINDINGS: The left costophrenic angle is excluded from the field of view. Stable cardiomegaly and increased interstitial markings. Increased patchy opacities in the right lower lobe. No pneumothorax or large pleural effusion. No acute osseous abnormality. IMPRESSION: 1. Increased patchy opacities in the right lower lobe, concerning for aspiration or pneumonia. 2. Stable cardiomegaly and interstitial pulmonary edema. Electronically Signed   By: Titus Dubin M.D.   On: 09/07/2017 16:21   Dg Swallowing Func-speech Pathology  Result Date: 09/21/2017 Objective Swallowing Evaluation: Type of Study: MBS-Modified Barium Swallow Study  Patient Details Name: Kayla Kent MRN: [id number] Date of Birth: [birth year] Today's Date: 09/21/2017 Time: SLP Start Time (ACUTE ONLY): 3875 -SLP Stop Time (ACUTE ONLY): 1403 SLP Time Calculation (min) (ACUTE ONLY): 16 min Past Medical History: Past Medical History: Diagnosis Date . Allergic rhinitis  . Anxiety  . CKD (chronic kidney disease) stage 3, GFR 30-59 ml/min (HCC)  . Emphysema of lung ([place])  . HLD (hyperlipidemia)  . HTN (hypertension)  . Intolerance of drug   orthostatic . Paroxysmal supraventricular tachycardia ([place])  . PVD (peripheral vascular disease) ([place])  . Seizure ([place])  . Syncope and collapse  . Uterine prolapse without mention of vaginal wall prolapse  Past Surgical History: Past Surgical History: Procedure Laterality Date . CARDIOVERSION N/A 11/05/2014  Procedure: CARDIOVERSION;  Surgeon: Candee Furbish, MD;  Location: [facility] ENDOSCOPY;  Service: Cardiovascular;  Laterality: N/A; . cataract surgery  08/2015 . CHOLECYSTECTOMY   . corrective eye surgery    as a child . ESOPHAGOGASTRODUODENOSCOPY N/A 09/09/2017  Procedure: ESOPHAGOGASTRODUODENOSCOPY (EGD);  Surgeon: Mauri Pole, MD;  Location: [facility] ENDOSCOPY;  Service: Endoscopy;  Laterality: N/A; . INTRAMEDULLARY (IM) NAIL INTERTROCHANTERIC Left 11/21/2016  Procedure:  INTRAMEDULLARY (IM) NAIL INTERTROCHANTRIC HEMI;  Surgeon: Newt Minion, MD;  Location: [place];  Service: Orthopedics;  Laterality: Left; . IR PERCUTANEOUS ART THROMBECTOMY/INFUSION INTRACRANIAL INC DIAG ANGIO  09/11/2017 . IVD removed   . OPEN REDUCTION INTERNAL FIXATION (ORIF) DISTAL RADIAL FRACTURE Right 08/29/2014  Procedure: OPEN REDUCTION INTERNAL FIXATION (ORIF) DISTAL RADIAL FRACTURE;  Surgeon: Marianna Payment, MD;  Location: [place];  Service: Orthopedics;  Laterality: Right; . RADIOLOGY WITH ANESTHESIA N/A 09/11/2017  Procedure: RADIOLOGY WITH ANESTHESIA;  Surgeon: Luanne Bras, MD;  Location: [place];  Service: Radiology;  Laterality: N/A; . RF ablation PSVT    summer '10 . stress cardiolite  08/05/93 . TONSILLECTOMY   . TOTAL HIP ARTHROPLASTY Right 08/29/2014  Procedure: Right Hip Hemi Arthroplasty;  Surgeon: Marianna Payment, MD;  Location: [place];  Service: Orthopedics;  Laterality: Right;  Hip procedure 1st wants Peg Board, [employer], [place].  HPI: Pt is an 83 y.o.femalewith medical history significant for stage III chronic kidney disease, dyslipidemia, chronic atrial fibrillation previously on Eliquis, hypertension, seizure disorder on Keppra, and recent outpatient fine-needle aspiration of thyroid nodule. 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 She was intubated 11/25-11/27. MBS completed 11/28 showed a mdoerate-severe oropharyngeal dysphagia with delays in swallow initiation adn pharyngeal weakness. She was recommended to remain NPO  with therapeutic trials of puree. Pt went to CIR on 11/30 where she continued to work on swallow initiation but was readmitted to the hospital on 12/3 with suspected PNA.  Subjective: pt is alert today Assessment / Plan / Recommendation CHL IP CLINICAL IMPRESSIONS 09/21/2017 Clinical Impression Pt continues to present with a moderate-severe  oropharyngeal dysphagia. She still has oral holding, right-sided weakness that allows for anterior spillage and incomplete clearance, and delayed swallow initiation to the pyriform sinuses. She has sensed aspiration with honey thick liquids during the swallow that cannot be sufficiently cleared, and moderate pharyngeal residue that is repeatedly penetrated into the airway as pt makes attempts to clear it (both cued and spontaneously) before it falls back in. Pureed solids are penetrated into the airway during the swallow and after, in a similar cyclical manner. Pt has mildly less residue and improved airway protection with honey thick liquids by spoon while using a chin tuck, but she needed Max-Total A for oral acceptance (unable to take boluses in by the cup or straw) and is anteriorly spilling more of what she does take in when in this posture. Study was limited by pt's continued coughing, sputtering, and seemingly difficult attempts at managing her secretions particularly when mixed with barium trials. There is still not a safe diet option to be started - I would continue with therapeutic trials of honey thick liquids or purees by spoon with use of a chin tuck. I think that it would be a laborous and high risk task to try to attempt a PO diet at this time, although the family could consider comfort feeds should they continue to have concerns  about the use of a feeding tube. SLP Visit Diagnosis Dysphagia, oropharyngeal phase (R13.12) Attention and concentration deficit following -- Frontal lobe and executive function deficit following -- Impact on safety and function Severe aspiration risk;Risk for inadequate nutrition/hydration   CHL IP TREATMENT RECOMMENDATION 09/21/2017 Treatment Recommendations Therapy as outlined in treatment plan below   Prognosis 09/21/2017 Prognosis for Safe Diet Advancement Fair Barriers to Reach Goals Cognitive deficits;Severity of deficits;Time post onset Barriers/Prognosis Comment -- CHL  IP DIET RECOMMENDATION 09/21/2017 SLP Diet Recommendations NPO;Alternative means - temporary Liquid Administration via -- Medication Administration Via alternative means Compensations -- Postural Changes --   CHL IP OTHER RECOMMENDATIONS 09/21/2017 Recommended Consults -- Oral Care Recommendations Oral care QID Other Recommendations Have oral suction available   CHL IP FOLLOW UP RECOMMENDATIONS 09/21/2017 Follow up Recommendations Inpatient Rehab   CHL IP FREQUENCY AND DURATION 09/21/2017 Speech Therapy Frequency (ACUTE ONLY) min 2x/week Treatment Duration 2 weeks      CHL IP ORAL PHASE 09/21/2017 Oral Phase Impaired Oral - Pudding Teaspoon -- Oral - Pudding Cup -- Oral - Honey Teaspoon Right anterior bolus loss;Weak lingual manipulation;Reduced posterior propulsion;Holding of bolus;Right pocketing in lateral sulci;Premature spillage Oral - Honey Cup NT Oral - Nectar Teaspoon -- Oral - Nectar Cup -- Oral - Nectar Straw -- Oral - Thin Teaspoon -- Oral - Thin Cup NT Oral - Thin Straw -- Oral - Puree Weak lingual manipulation;Reduced posterior propulsion;Holding of bolus;Right pocketing in lateral sulci;Premature spillage Oral - Mech Soft -- Oral - Regular -- Oral - Multi-Consistency -- Oral - Pill -- Oral Phase - Comment --  CHL IP PHARYNGEAL PHASE 09/21/2017 Pharyngeal Phase Impaired Pharyngeal- Pudding Teaspoon -- Pharyngeal -- Pharyngeal- Pudding Cup -- Pharyngeal -- Pharyngeal- Honey Teaspoon Delayed swallow initiation-pyriform sinuses;Reduced pharyngeal peristalsis;Reduced airway/laryngeal closure;Reduced tongue base retraction;Moderate aspiration;Penetration/Aspiration during swallow;Penetration/Apiration after swallow;Pharyngeal residue - valleculae;Pharyngeal residue - pyriform;Pharyngeal residue - posterior pharnyx Pharyngeal Material enters airway, passes BELOW cords and not ejected out despite cough attempt by patient Pharyngeal- Honey Cup -- Pharyngeal -- Pharyngeal- Nectar Teaspoon -- Pharyngeal -- Pharyngeal-  Nectar Cup -- Pharyngeal -- Pharyngeal- Nectar Straw -- Pharyngeal -- Pharyngeal- Thin Teaspoon -- Pharyngeal -- Pharyngeal- Thin Cup NT Pharyngeal -- Pharyngeal- Thin Straw -- Pharyngeal -- Pharyngeal- Puree Delayed swallow initiation-pyriform sinuses;Reduced pharyngeal peristalsis;Reduced airway/laryngeal closure;Reduced tongue base retraction;Penetration/Aspiration during swallow;Pharyngeal residue - valleculae;Pharyngeal residue - pyriform;Pharyngeal residue - posterior pharnyx Pharyngeal Material enters airway, remains ABOVE vocal cords then ejected out Pharyngeal- Mechanical Soft -- Pharyngeal -- Pharyngeal- Regular -- Pharyngeal -- Pharyngeal- Multi-consistency -- Pharyngeal -- Pharyngeal- Pill -- Pharyngeal -- Pharyngeal Comment --  CHL IP CERVICAL ESOPHAGEAL PHASE 09/21/2017 Cervical Esophageal Phase WFL Pudding Teaspoon -- Pudding Cup -- Honey Teaspoon -- Honey Cup -- Nectar Teaspoon -- Nectar Cup -- Nectar Straw -- Thin Teaspoon -- Thin Cup -- Thin Straw -- Puree -- Mechanical Soft -- Regular -- Multi-consistency -- Pill -- Cervical Esophageal Comment -- No flowsheet data found. Kayla Kent 09/21/2017, 3:08 PM  Kayla Kent, M.A. CCC-SLP [phone]             Dg Swallowing Func-speech Pathology  Result Date: 09/14/2017 Completed by Aaron Edelman, SLP Student. Supervised and reviewed by Herbie Baltimore MA CCC-SLP Objective Swallowing Evaluation: Type of Study: MBS-Modified Barium Swallow Study  Patient Details Name: Kayla Kent MRN: [id number] Date of Birth: November 05, 1931 Today's Date: 09/14/2017 Time: SLP Start Time (ACUTE ONLY): 1020 -SLP Stop Time (ACUTE ONLY): 1037 SLP Time Calculation (min) (ACUTE ONLY): 17 min Past Medical History: Past Medical History:  Diagnosis Date . Allergic rhinitis  . Anxiety  . CKD (chronic kidney disease) stage 3, GFR 30-59 ml/min (HCC)  . Emphysema of lung ([place])  . HLD (hyperlipidemia)  . HTN (hypertension)  . Intolerance of drug   orthostatic . Paroxysmal  supraventricular tachycardia ([place])  . PVD (peripheral vascular disease) ([place])  . Seizure ([place])  . Syncope and collapse  . Uterine prolapse without mention of vaginal wall prolapse  Past Surgical History: Past Surgical History: Procedure Laterality Date . CARDIOVERSION N/A 11/05/2014  Procedure: CARDIOVERSION;  Surgeon: Candee Furbish, MD;  Location: [facility] ENDOSCOPY;  Service: Cardiovascular;  Laterality: N/A; . cataract surgery  08/2015 . CHOLECYSTECTOMY   . corrective eye surgery    as a child . ESOPHAGOGASTRODUODENOSCOPY N/A 09/09/2017  Procedure: ESOPHAGOGASTRODUODENOSCOPY (EGD);  Surgeon: Mauri Pole, MD;  Location[facility] ENDOSCOPY;  Service: Endoscopy;  Laterality: N/A; . INTRAMEDULLARY (IM) NAIL INTERTROCHANTERIC Left 11/21/2016  Procedure: INTRAMEDULLARY (IM) NAIL INTERTROCHANTRIC HEMI;  Surgeon: Newt Minion, MD;  Location: [place];  Service: Orthopedics;  Laterality: Left; . IR PERCUTANEOUS ART THROMBECTOMY/INFUSION INTRACRANIAL INC DIAG ANGIO  09/11/2017 . IVD removed   . OPEN REDUCTION INTERNAL FIXATION (ORIF) DISTAL RADIAL FRACTURE Right 08/29/2014  Procedure: OPEN REDUCTION INTERNAL FIXATION (ORIF) DISTAL RADIAL FRACTURE;  Surgeon: Marianna Payment, MD;  Location: [place];  Service: Orthopedics;  Laterality: Right; . RADIOLOGY WITH ANESTHESIA N/A 09/11/2017  Procedure: RADIOLOGY WITH ANESTHESIA;  Surgeon: Luanne Bras, MD;  Location: [place];  Service: Radiology;  Laterality: N/A; . RF ablation PSVT    summer '10 . stress cardiolite  08/05/93 . TONSILLECTOMY   . TOTAL HIP ARTHROPLASTY Right 08/29/2014  Procedure: Right Hip Hemi Arthroplasty;  Surgeon: Marianna Payment, MD;  Location: [place];  Service: Orthopedics;  Laterality: Right;  Hip procedure 1st wants Peg Board, [employer], [place].  HPI: Kayla Kent a 83 y.o.femalewith medical history significant forstage III chronic kidney disease, dyslipidemia, chronic atrial fibrillation on Eliquis, hypertension, seizure disorder on AEDs, and  recent FNA of thyroid nodule. Initial admit for upper gi bleed but also having progress oxygen needs found to have multifocal pneumonia thought to be due to aspiration pneumonia in the setting of newaphasia and right hemiplegia yesterday requiringmedicalthrombectomy. MRI Brain 11/27 shows acute ischemic infarct within the left hemisphere, predominantly located within the left precentral gyrus and frontal operculum, also involving the left postcentral gyrus. Pt intubated for thrombectomy on 11/25 - extubated on 11/27.  No Data Recorded Assessment / Plan / Recommendation CHL IP CLINICAL IMPRESSIONS 09/14/2017 Clinical Impression Pt presents with moderate-severe oropharyngeal dysphagia marked by oral holding, delayed swallow initiation to the pyriforms, moderate residue post swallow and aspiration during the swallow with thin and honey-thick liquid consistencies. Pt required MAX cueing and sensory feedback (large boluses/spoon bolus further back) for swallow initiation. Aspirate sensed by patient; however, pt unable to effectively cough to clear aspirate from airway. Puree consistency transited without penetration/aspiration however, resulted in moderate pharyngeal residue. Pt unable to clear residue with multiple swallows; however, chin tuck decreases residue. Pt's dysphagia secondary to neuromuscular impairment following stroke, delayed timing of swallow initiation and possibly recent extubation. Recommend pt remain NPO until medically ready to repeat objective swallow study. Will continue with SLP intervention targeting PO trials of purees to further readiness for objective study.  SLP Visit Diagnosis Frontal lobe and executive function deficit Attention and concentration deficit following -- Frontal lobe and executive function deficit following Cerebral infarction Impact on safety and function Severe aspiration risk  CHL IP TREATMENT RECOMMENDATION 09/14/2017 Treatment Recommendations Therapy as outlined in  treatment plan below   Prognosis 09/14/2017 Prognosis for Safe Diet Advancement Fair Barriers to Reach Goals Cognitive deficits;Severity of deficits;Time post onset Barriers/Prognosis Comment -- CHL IP DIET RECOMMENDATION 09/14/2017 SLP Diet Recommendations NPO Liquid Administration via -- Medication Administration -- Compensations -- Postural Changes --   CHL IP OTHER RECOMMENDATIONS 09/14/2017 Recommended Consults -- Oral Care Recommendations Oral care QID Other Recommendations --   CHL IP FOLLOW UP RECOMMENDATIONS 09/14/2017 Follow up Recommendations Skilled Nursing facility   [facility] IP FREQUENCY AND DURATION 09/14/2017 Speech Therapy Frequency (ACUTE ONLY) min 2x/week Treatment Duration 2 weeks      CHL IP ORAL PHASE 09/14/2017 Oral Phase Impaired Oral - Pudding Teaspoon -- Oral - Pudding Cup -- Oral - Honey Teaspoon Holding of bolus;Delayed oral transit;Premature spillage Oral - Honey Cup Holding of bolus;Delayed oral transit;Premature spillage Oral - Nectar Teaspoon -- Oral - Nectar Cup -- Oral - Nectar Straw -- Oral - Thin Teaspoon -- Oral - Thin Cup Delayed oral transit;Premature spillage Oral - Thin Straw -- Oral - Puree Delayed oral transit;Premature spillage Oral - Mech Soft -- Oral - Regular -- Oral - Multi-Consistency -- Oral - Pill -- Oral Phase - Comment --  CHL IP PHARYNGEAL PHASE 09/14/2017 Pharyngeal Phase Impaired Pharyngeal- Pudding Teaspoon -- Pharyngeal -- Pharyngeal- Pudding Cup -- Pharyngeal -- Pharyngeal- Honey Teaspoon Moderate aspiration;Delayed swallow initiation-pyriform sinuses;Reduced pharyngeal peristalsis;Reduced airway/laryngeal closure;Reduced tongue base retraction;Penetration/Aspiration during swallow;Pharyngeal residue - valleculae;Pharyngeal residue - pyriform;Pharyngeal residue - posterior pharnyx Pharyngeal Material enters airway, passes BELOW cords and not ejected out despite cough attempt by patient Pharyngeal- Honey Cup -- Pharyngeal -- Pharyngeal- Nectar Teaspoon --  Pharyngeal -- Pharyngeal- Nectar Cup -- Pharyngeal -- Pharyngeal- Nectar Straw -- Pharyngeal -- Pharyngeal- Thin Teaspoon -- Pharyngeal -- Pharyngeal- Thin Cup Moderate aspiration;Delayed swallow initiation-pyriform sinuses;Reduced pharyngeal peristalsis;Reduced airway/laryngeal closure;Reduced tongue base retraction;Penetration/Aspiration during swallow;Pharyngeal residue - valleculae;Pharyngeal residue - pyriform;Pharyngeal residue - posterior pharnyx Pharyngeal Material enters airway, passes BELOW cords and not ejected out despite cough attempt by patient Pharyngeal- Thin Straw -- Pharyngeal -- Pharyngeal- Puree Delayed swallow initiation-pyriform sinuses;Reduced pharyngeal peristalsis;Reduced airway/laryngeal closure;Reduced tongue base retraction;Pharyngeal residue - valleculae;Pharyngeal residue - pyriform;Pharyngeal residue - posterior pharnyx Pharyngeal Material does not enter airway Pharyngeal- Mechanical Soft -- Pharyngeal -- Pharyngeal- Regular -- Pharyngeal -- Pharyngeal- Multi-consistency -- Pharyngeal -- Pharyngeal- Pill -- Pharyngeal -- Pharyngeal Comment --  CHL IP CERVICAL ESOPHAGEAL PHASE 09/14/2017 Cervical Esophageal Phase WFL Pudding Teaspoon -- Pudding Cup -- Honey Teaspoon -- Honey Cup -- Nectar Teaspoon -- Nectar Cup -- Nectar Straw -- Thin Teaspoon -- Thin Cup -- Thin Straw -- Puree -- Mechanical Soft -- Regular -- Multi-consistency -- Pill -- Cervical Esophageal Comment -- No flowsheet data found. Supervised and reviewed by [facility] MA CCC-SLP [place], Katherene Ponto 09/14/2017, 11:51 AM              Ir Percutaneous Art Thrombectomy/infusion Intracranial Inc Diag Angio  Result Date: 09/13/2017 INDICATION: Acute onset of right-sided paralysis, global aphasia and gaze deviation. NIH score of 24. EXAM: 1. EMERGENT LARGE VESSEL OCCLUSION THROMBOLYSIS (anterior CIRCULATION) COMPARISON:  CT scan of the brain of 09/11/2017. MEDICATIONS: Vancomycin 1 g IV antibiotic was administered  within 1 hour of the procedure. ANESTHESIA/SEDATION: General anesthesia. CONTRAST:  Isovue 300 approximately 100 mL. FLUOROSCOPY TIME:  Fluoroscopy Time: 40 minutes 18 seconds (2091 mGy). COMPLICATIONS: None immediate. TECHNIQUE: Following a full explanation of the procedure along with the potential associated complications, an informed  witnessed consent was obtained from the patient's daughter. The risks of intracranial hemorrhage of 10%, worsening neurological deficit, ventilator dependency, death and inability to revascularize were all reviewed in detail with the patient's daughter. The patient was then put under general anesthesia by the Department of Anesthesiology at [facility]. The right groin was prepped and draped in the usual sterile fashion. Thereafter using modified Seldinger technique, transfemoral access into the right common femoral artery was obtained without difficulty. Over a 0.035 inch guidewire a 5 French Pinnacle sheath was inserted. Through this, and also over a 0.035 inch guidewire a 5 [place] JB 1 catheter was advanced to the aortic arch region and selectively positioned in the in the left common carotid artery. FINDINGS: The left common carotid arteriogram demonstrates the left external carotid artery and its major branches to be widely patent. The left internal carotid artery at the bulb demonstrates a smooth plaque along the posterior wall without associated significant stenosis by the NASCET criteria. Just distal to the bulb and also along the posterior wall there is a smooth lucency which probably represents an atherosclerotic smooth plaque. Distal to this the left internal carotid artery is seen to opacify to the cranial skull base. There is a focal segment of mild stenosis at the distal cervical segment. Distal to this the petrous, cavernous and supraclinoid segments demonstrate wide patency. The left middle cerebral artery has approximately 50% stenosis in the left M1 segment.  The trifurcation branches demonstrate patency with angiographic occlusion of the distal inferior division of the left middle cerebral artery. The delayed capillary and the arterial phase demonstrated a prominent area of hypoperfusion involving primarily the posterior 2/3 of the peri-insular cortical and subcortical regions. Transient filling via the anterior communicating artery of the right anterior cerebral artery A2 segment is seen. The right anterior cerebral artery is seen to opacify normally into the capillary and venous phases. PROCEDURE: ENDOVASCULAR COMPLETE REVASCULARIZATION OF THE OCCLUDED LEFT MIDDLE CEREBRAL ARTERY INFERIOR DIVISION DISTALLY WITH 2 PASSES WITH THE SOLITAIRE FR 4 MM X 40 MM RETRIEVAL DEVICE, AND USE OF 4.5 MG OF SUPER SELECTIVE INTRACRANIAL INTEGRELIN The diagnostic JB 1 catheter in the left common carotid artery was exchanged for a 0.035 inch 300 cm Rosen exchange guidewire for an 8 French 55 cm Brite tip neurovascular sheath using biplane roadmap technique and constant fluoroscopic guidance. Good aspiration was obtained from the side port of the hub of the neurovascular sheath. This was then connected to continuous heparinized saline infusion. Over the [employer] guidewire, an 8 [place] 85 cm FlowGate balloon guide catheter which had been prepped with 50% contrast and 50% heparinized saline infusion was then advanced and positioned just proximal to the left common carotid bifurcation. The Rosen exchange guidewire was then removed. Good aspiration obtained from the hub of the of FlowGate balloon guide catheter. Over a 0.035 inch Roadrunner guidewire, using biplane roadmap technique and constant fluoroscopic guidance, the FlowGate guide catheter was then advanced to the distal cervical segment of the left internal carotid artery. The guidewire was removed. Good aspiration obtained from the hub of the [facility] guide catheter. A gentle contrast injection demonstrated brisk antegrade  flow of contrast into the intracranial circulation without any changes. A combination of a Trevo ProVue microcatheter inside of a 115 cm 5 [place] Catalyst guide catheter was then advanced over a 0.014 inch Softip Synchro micro guidewire to the distal end of the [facility] guide catheter. With the micro guidewire leading with a J-tip configuration, the combination  was then navigated without difficulty to the supraclinoid left ICA. Using a torque device, the micro guidewire was then gently advanced through the narrowed left middle cerebral artery into the occluded distal aspect of the inferior division of the left middle cerebral artery followed by the microcatheter. The guidewire was removed. There was resistance to the aspiration of blood at the hub of the microcatheter. The microcatheter tip was advanced gently retrieved until there was free aspiration of blood. A very gentle contrast injection demonstrated slow antegrade ascent of contrast distally. The 5 [place] Catalyst guide catheter was then advanced into the origin of the left middle cerebral artery. A 4 mm x 40 mm Solitaire retrieval device was then advanced to the distal end of the microcatheter. The O ring on the delivery microcatheter was then loosened. After having ascertained the placement of the retrieval device, with slight forward gentle traction with the right hand on the delivery micro guidewire, with the left hand the delivery microcatheter was then retrieved unsheathing the retrieval device. A control arteriogram performed through the 5 [place] Catalyst guide catheter demonstrated slow flow through the now patent previously occluded inferior division distally. The balloon of the [facility] guide catheter was then inflated in the left internal carotid artery distally for proximal flow arrest. The proximal portion of the retrieval device was then captured. Thereafter constant aspiration was applied with a 60 mL syringe at the hub of the [facility] guide  catheter and also at the hub of the 5 Dare guide catheter as the combination of the retrieval device, the microcatheter, the guide catheter was gently retrieved and removed. Aspiration was continued as the balloon was then deflated. There was no evidence of clot in the interstices of the retrieval device, or the aspirate. After having ascertained free back bleed at the hub of the 8 [place] FlowGate guide catheter a control arteriogram performed through the [facility] guide catheter in the left internal carotid artery demonstrated no change in the occluded left middle cerebral artery inferior division distally. A second pass was then made again as described above. A combination of the microcatheter inside of a Catalyst guide catheter and a 5 French 115 cm was advanced over a 0.014 inch Softip Synchro micro guidewire as described above with the microcatheter being again positioned distal to the known site of clot. Free aspiration of blood was obtained at the hub of the microcatheter prior to the placement of a 4 mm x 40 mm Solitaire FR retrieval device. Again the device was positioned and then deployed with slight forward gentle traction with the right hand on the delivery micro guidewire with the left hand the delivery microcatheter was retrieved. The Catalyst guide catheter was then advanced into the proximal left M1 segment. Thereafter after having captured the proximal portion of the retrieval device into the microcatheter, with proximal flow arrest in the left internal carotid artery, the combination of the retrieval device, the microcatheter, and the Catalyst guide catheter was retrieved and removed as constant aspiration was applied with a 60 mL syringe at the hub of the [facility] guide catheter. Again aspiration was continued as the balloon was deflated in the left internal carotid artery to reverse flow arrest. Again back bleed was noted at the hub of the [facility] guide catheter. No significant clot  was noted in the aspirate, or on the retrieval device. Arteriogram performed through the 8 [place] FlowGate guide catheter in the left internal carotid artery demonstrated complete revascularization of the previously noted occluded inferior division  of the left middle cerebral artery. Spasm was noted within the re-vascularized vessel, and also the left M1 segment. This resolved with 3 aliquots of 25 mics of nitroglycerin. Also at this time, approximately 4.5 mg of intra-arterial Integrilin was given. A control arteriogram performed at 10 minutes following this demonstrated complete resolution of the previously noted vasospasm with now brisk flow through the re-vascularized left middle cerebral artery inferior division distally. The remaining branches remained patent as did the anterior cerebral artery distribution. Throughout the procedure, the patient's blood pressure, neurological status and hemodynamic status remained stable. No evidence of extravasation of contrast, or mass-effect on the major vessel intracranially was noted. The 8 [place] FlowGate guide catheter and the neurovascular sheath were then retrieved into the abdominal aorta and exchanged for an 8 French Pinnacle sheath over a J-tip guidewire. A control arteriogram performed through the sheath demonstrated the puncture site for placement of an external closure device. An 8 [place] Angio-Seal was then positioned for hemostasis without difficulty. The patient's distal pulses remained Dopplerable in the dorsalis pedis and the posterior tibial arteries bilaterally unchanged from prior to the examination. IMPRESSION: Status post endovascular complete revascularization of occluded inferior division of the left middle cerebral artery distally with 2 passes with the Solitaire FR 4 mm x 40 mm retrieval device, and 4.5 mg of super selective intracranial Integrelin achieving a TICI 3 reperfusion. PLAN: Patient transported to the CT scan suite for postprocedural CT  scan of the brain. Electronically Signed   By: Luanne Bras M.D.   On: 09/12/2017 18:29   Ct Head Code Stroke Wo Contrast  Result Date: 09/11/2017 CLINICAL DATA:  Code stroke.  Right-sided weakness. EXAM: CT HEAD WITHOUT CONTRAST TECHNIQUE: Contiguous axial images were obtained from the base of the skull through the vertex without intravenous contrast. COMPARISON:  09/06/2017 CT of the head. FINDINGS: Brain: No evidence of acute infarction, hemorrhage, hydrocephalus, extra-axial collection or mass lesion/mass effect. Right anterior temporal lobe chronic encephalomalacia is unchanged. Foci of hypoattenuation predominantly in periventricular white matter is stable and compatible with mild chronic microvascular ischemic changes. Mild diffuse brain parenchymal volume loss is stable. Vascular: Calcific atherosclerosis of carotid siphons. Skull: Normal. Negative for fracture or focal lesion. Sinuses/Orbits: No acute finding. Other: None. ASPECTS [facility] Stroke Program Early CT Score) - Ganglionic level infarction (caudate, lentiform nuclei, internal capsule, insula, M1-M3 cortex): 7 - Supraganglionic infarction (M4-M6 cortex): 3 Total score (0-10 with 10 being normal): 10 IMPRESSION: 1. No acute intracranial abnormality identified. 2. Stable right anterior temporal lobe encephalomalacia, chronic microvascular ischemic changes, and parenchymal volume loss of the brain. 3. ASPECTS is 10 These results were called by telephone at the time of interpretation on 09/11/2017 at 8:04 pm to Dr. Lorraine Lax who verbally acknowledged these results. Electronically Signed   By: Kristine Garbe M.D.   On: 09/11/2017 20:04    Labs:  CBC: Recent Labs    09/21/17 0420 09/22/17 0652 09/23/17 0445 09/24/17 0244  WBC 9.9 13.1* 16.9* 15.0*  HGB 8.8* 9.5* 9.8* 9.9*  HCT 27.7* 29.7* 32.1* 32.0*  PLT 412* 448* 383 402*    COAGS: Recent Labs    04/06/17 1906 04/06/17 2212 09/11/17 2010 09/13/17 0334  INR 1.51  1.59 1.22 1.17  APTT 34 38* 36  --     BMP: Recent Labs    09/21/17 0854 09/22/17 0652 09/23/17 0445 09/24/17 0244  NA 136 137 137 137  K 4.0 4.0 4.3 4.5  CL 100* 103 103 100*  CO2 26  25 23 28   GLUCOSE 144* 127* 114* 113*  BUN 16 16 15 15   CALCIUM 7.7* 8.1* 8.3* 8.3*  CREATININE 0.66 0.65 0.63 0.64  GFRNONAA >60 >60 >60 >60  GFRAA >60 >60 >60 >60    LIVER FUNCTION TESTS: Recent Labs    09/21/17 0854 09/22/17 0652 09/23/17 0445 09/24/17 0244  BILITOT 0.5 0.5 0.5 0.3  AST 36 29 32 43*  ALT 50 41 39 49  ALKPHOS 95 85 91 86  PROT 5.2* 5.1* 5.5* 5.6*  ALBUMIN 2.1* 2.1* 2.3* 2.2*    TUMOR MARKERS: No results for input(s): AFPTM, CEA, CA199, CHROMGRNA in the last 8760 hours.  Assessment and Plan: Dysphagia Admitted with aspiration pneumonia, failed MBS. Discussed with patient and daughter who are in agreement for procedure.  Risks and benefits discussed with the patient including, but not limited to the need for a barium enema during the procedure, bleeding, infection, peritonitis, or damage to adjacent structures. All of the patient's questions were answered, patient is agreeable to proceed. Consent signed and in chart. Anticipate procedure early next week.  Will place NPO and hold thinners for Tuesday.   Thank you for this interesting consult.  I greatly enjoyed meeting Kayla Kent and look forward to participating in their care.  A copy of this report was sent to the requesting provider on this date.  Electronically Signed: Docia Barrier, PA 09/24/2017, 3:21 PM   I spent a total of 40 Minutes    in face to face in clinical consultation, greater than 50% of which was counseling/coordinating care for dysphagia.

## 2017-09-24 NOTE — Progress Notes (Signed)
ANTICOAGULATION CONSULT NOTE - Follow Up Consult  Pharmacy Consult for Heparin Indication: Afib and CVA  Patient Measurements: Height: 5\' 7"  (170.2 cm) Weight: 99 lb 6.8 oz (45.1 kg) IBW/kg (Calculated) : 61.6   Vital Signs: Temp: 97.5 F (36.4 C) (12/08 0733) Temp Source: Oral (12/08 0733) BP: 126/62 (12/08 1000) Pulse Rate: 86 (12/08 1000)  Assessment: 26 YOF who continues on heparin for hx afib, new stroke 11/25 (PTA Eliquis) Had GIB on admit 11/20. Heparin resumed on 11/28 with lower heparin goal of 0.3-0.5units/mL.   Heparin level this morning fell to therapeutic at 0.26units/mL. Hgb 9.9, plts 402- stable, no bleeding noted.  Goal of Therapy:  Heparin level 0.3-0.5 units/ml Monitor platelets by anticoagulation protocol: Yes   Plan:  Increase heparin gtt to 1000 units/hr Monitor daily heparin level, CBC, s/s of bleed Plan is to transition to Apixaban when able to swallow safely or has PEG tube per neuro (likely will not be placed until early next week per notes)  Julizza Sassone D. Khushbu Pippen, PharmD, BCPS Clinical Pharmacist Clinical Phone for 09/24/2017 until 3:30pm: [phone] If after 3:30pm, please call main pharmacy at [phone] 09/24/2017 11:41 AM

## 2017-09-25 ENCOUNTER — Inpatient Hospital Stay (HOSPITAL_COMMUNITY): Payer: Medicare Other

## 2017-09-25 LAB — COMPREHENSIVE METABOLIC PANEL
ALT: 46 U/L (ref 14–54)
AST: 35 U/L (ref 15–41)
Albumin: 2.3 g/dL — ABNORMAL LOW (ref 3.5–5.0)
Alkaline Phosphatase: 78 U/L (ref 38–126)
Anion gap: 9 (ref 5–15)
BILIRUBIN TOTAL: 0.5 mg/dL (ref 0.3–1.2)
BUN: 15 mg/dL (ref 6–20)
CALCIUM: 8.6 mg/dL — AB (ref 8.9–10.3)
CHLORIDE: 102 mmol/L (ref 101–111)
CO2: 27 mmol/L (ref 22–32)
CREATININE: 0.67 mg/dL (ref 0.44–1.00)
Glucose, Bld: 100 mg/dL — ABNORMAL HIGH (ref 65–99)
Potassium: 4.7 mmol/L (ref 3.5–5.1)
Sodium: 138 mmol/L (ref 135–145)
TOTAL PROTEIN: 5.3 g/dL — AB (ref 6.5–8.1)

## 2017-09-25 LAB — CBC WITH DIFFERENTIAL/PLATELET
Basophils Absolute: 0 10*3/uL (ref 0.0–0.1)
Basophils Relative: 0 %
EOS PCT: 1 %
Eosinophils Absolute: 0.2 10*3/uL (ref 0.0–0.7)
HEMATOCRIT: 31.2 % — AB (ref 36.0–46.0)
Hemoglobin: 9.7 g/dL — ABNORMAL LOW (ref 12.0–15.0)
LYMPHS ABS: 1.3 10*3/uL (ref 0.7–4.0)
LYMPHS PCT: 8 %
MCH: 29.8 pg (ref 26.0–34.0)
MCHC: 31.1 g/dL (ref 30.0–36.0)
MCV: 96 fL (ref 78.0–100.0)
MONO ABS: 1.4 10*3/uL — AB (ref 0.1–1.0)
Monocytes Relative: 8 %
Neutro Abs: 14.5 10*3/uL — ABNORMAL HIGH (ref 1.7–7.7)
Neutrophils Relative %: 83 %
PLATELETS: 420 10*3/uL — AB (ref 150–400)
RBC: 3.25 MIL/uL — AB (ref 3.87–5.11)
RDW: 16.6 % — AB (ref 11.5–15.5)
WBC: 17.4 10*3/uL — ABNORMAL HIGH (ref 4.0–10.5)

## 2017-09-25 LAB — GLUCOSE, CAPILLARY
GLUCOSE-CAPILLARY: 61 mg/dL — AB (ref 65–99)
GLUCOSE-CAPILLARY: 63 mg/dL — AB (ref 65–99)
Glucose-Capillary: 78 mg/dL (ref 65–99)
Glucose-Capillary: 86 mg/dL (ref 65–99)
Glucose-Capillary: 96 mg/dL (ref 65–99)
Glucose-Capillary: 63 mg/dL — ABNORMAL LOW (ref 65–99)
Glucose-Capillary: 107 mg/dL — ABNORMAL HIGH (ref 65–99)

## 2017-09-25 LAB — MAGNESIUM: MAGNESIUM: 2 mg/dL (ref 1.7–2.4)

## 2017-09-25 LAB — HEPARIN LEVEL (UNFRACTIONATED): Heparin Unfractionated: 0.42 IU/mL (ref 0.30–0.70)

## 2017-09-25 LAB — PHOSPHORUS: PHOSPHORUS: 4.8 mg/dL — AB (ref 2.5–4.6)

## 2017-09-25 MED ORDER — DEXTROSE 50 % IV SOLN
INTRAVENOUS | Status: AC
  Administered 2017-09-25: 25 mL
  Filled 2017-09-25: qty 50

## 2017-09-25 MED ORDER — SODIUM CHLORIDE 0.9 % IV BOLUS (SEPSIS)
250.0000 mL | Freq: Once | INTRAVENOUS | Status: AC
  Administered 2017-09-25: 250 mL via INTRAVENOUS

## 2017-09-25 MED ORDER — POTASSIUM CL IN DEXTROSE 5% 20 MEQ/L IV SOLN
20.0000 meq | INTRAVENOUS | Status: DC
  Administered 2017-09-25: 20 meq via INTRAVENOUS
  Filled 2017-09-25: qty 1000

## 2017-09-25 MED ORDER — DEXTROSE-NACL 5-0.9 % IV SOLN
INTRAVENOUS | Status: DC
  Administered 2017-09-26 (×2): via INTRAVENOUS

## 2017-09-25 MED ORDER — DEXTROSE 50 % IV SOLN
INTRAVENOUS | Status: AC
Start: 2017-09-25 — End: 2017-09-25
  Administered 2017-09-25: 25 mL
  Filled 2017-09-25: qty 50

## 2017-09-25 NOTE — Progress Notes (Signed)
ANTICOAGULATION CONSULT NOTE - Follow Up Consult  Pharmacy Consult for Heparin Indication: Afib and CVA  Patient Measurements: Height: 5\' 7"  (170.2 cm) Weight: 99 lb 6.8 oz (45.1 kg) IBW/kg (Calculated) : 61.6   Vital Signs: Temp: 98.2 F (36.8 C) (12/09 1134) Temp Source: Oral (12/09 1134) BP: 119/49 (12/09 1134) Pulse Rate: 73 (12/09 1134)  Assessment: 86 YOF who continues on heparin for hx afib, new stroke 11/25 (PTA Eliquis) Had GIB on admit 11/20. Heparin resumed on 11/28 with lower heparin goal of 0.3-0.5units/mL.   Heparin level this morning in range at 0.42units/mL. Hgb 9.7, plts 420- stable, no bleeding noted.  Goal of Therapy:  Heparin level 0.3-0.5 units/ml Monitor platelets by anticoagulation protocol: Yes   Plan:  -Continue heparin gtt at 1000 units/hr -Monitor daily heparin level, CBC, s/s of bleed -Plan is to transition to Apixaban when able to swallow safely or has PEG tube per neuro (likely will not be placed until early next week per notes)  Chizaram Latino D. Areli Jowett, PharmD, BCPS Clinical Pharmacist Clinical Phone for 09/25/2017 until 3:30pm: [phone] If after 3:30pm, please call main pharmacy at [phone] 09/25/2017 11:44 AM

## 2017-09-25 NOTE — Progress Notes (Signed)
Hypoglycemic Event  CBG: 63  Treatment: 1/2 amp (25ml) of D50  Symptoms: none  Follow-up CBG: Time: 1755 CBG Result:  106  Possible Reasons for Event: Pt.'s tube feedings stopped because of loss of access  Comments/MD notified: Dr. Audree Bane

## 2017-09-25 NOTE — Progress Notes (Signed)
PROGRESS NOTE    Kayla Kent  WFU:[id number] DOB: [birth year] DOA: 09/19/2017 PCP: Biagio Borg, MD   Brief Narrative:  Kayla Kent a 83 y.o.femalewith medical history significant for stage III chronic kidney disease, dyslipidemia, chronic atrial fibrillation previously on Eliquis, hypertension, seizure disorder on Keppra, and recent outpatient fine-needle aspiration of thyroid nodule.  Patient was initially admitted on 11/20 secondary to acute upper GI bleeding Heme Positive as well as multidrug-resistant Klebsiella pneumonia.   On 11/25 patient was aphasic with respiratory distress and was later found to have an acute MCA stroke.  Her eliquis had been held due to the GI bleed.  11/25: underwent thrombolysis of left middle cerebral artery She was treated for aspiration pneumonia as well and was intubated. She was eventually discharged to CIR on 11/30 On 12/3 she was evaluated for fever and suspected to have continued Aspiration pneumonia and readmitted to the hospital. On 12/5 she underwent MBS again and failed and will continue TF via Cortrack.  12/6 IR Consulted for PEG Placement. Awaiting to be done and discussed with Dr. Laurence Ferrari of IR and states it may not be done until early next week. Per IR note likely to be done on Tuesday 09/27/17.  12/8-12/9 overnight patient Pulled out Cortrak. Will not place back just yet as patient to have PEG early Tuesday.   Assessment & Plan:   Active Problems:   Anxiety   Essential hypertension   Atrial fibrillation with RVR (HCC)   Acute renal failure (HCC)   Gastroesophageal reflux disease   CKD (chronic kidney disease) stage 3, GFR 30-59 ml/min (HCC)   Chronic atrial fibrillation (HCC)   Dyslipidemia   COPD (chronic obstructive pulmonary disease) (HCC)   Acute hypoxemic respiratory failure (HCC)   Cerebral thrombosis with cerebral infarction   Aspiration pneumonia due to gastric secretions (HCC)   Acute ischemic left MCA stroke  (HCC)   Acute respiratory failure with hypoxia (HCC)   Dysphagia  Acute respiratory failure with hypoxia (HCC) 2/2 to Aspiration PNA - Leukocytosis went from 14.6 -> 16.7 -> 9.9 -> 13.1 -> 16.9 -> 15.0 - Possible Aspiration Pneumonia again- low grade fever yesterday with possible Hypoxia- now is 100% on 2 L O2 - Apparently completed 5 days of antibiotics on 11/25 - On last admission: sputum and blood cultures were negative & resp virus panel was Negative - Respiratory Virus Panel Negative - WBC worsened to 17.4 as patient pulled out Cortrak - Blood Cx x2 show NGTD at 5 Days - Now on Levaquin- will continue to follow; Will broaden Abx Covearge and add Vancomycin  - CXR this AM showed The patient's left upper extremity overlies the chest. Line for this, there is no suspected change in bilateral pneumonia most pronounced in the right upper lobe and left lower lobe. - Repeat CXR in AM   Recent CVA with Dysphagia - will return to CIR when stable - has been on IV Heparin at CIR- continue for now until PEG in Place -Tube feeds through Cortrack discontinued for now as patient pulled out Cotrak  - Repeat MBS showed that patient did not pass  - Dietician evaluated and adjusted TF - IR evaluated 12/8 and PEG to be placed likely on Tuesday 09/27/17 - Will start IVF with D5W at 50 mL/hr x 10 hours   A-Fib/ Flutter -Was not on rate controlling agent- cont Heparin for now and restart Eliquis when Appropriate through PEG Tube -Liquid Metoprolol 25 mg BID through Cortrak changed to  IV   HTN (hypertension) -Current blood pressure stable but having low diastolics -Given Gentle IVF bolus and started D5W at 50 mL/hr -Hold previous Clonidine -Can utilize hydralazine IV prn for episodic hypertension -Started Patient on Metoprolol 25 mg BID but unable to give because patient pulled out Cortrak so changed to IV  CKD (chronic kidney disease), stage III  -Renal function stable and at baseline -BUN/Cr  was 15/0.63 -> 15/0.64 -> 15/0.67  Seizure Disorder -Cont IV Keppra for Now until able to through PEG when it is placed  GI bleed? -EGD was normal on 11/23  Thyroid nodule -Recent FNA of thyroid nodule with indeterminate pathology; testing ordered by PCP and patient can follow-up with this physician to discuss significance of findings  Dyslipidemia -Statin on hold until can reintroduce tube feedings through PEG  Leukocytosis, worsening  -WBC went from 9.9 -> 13.1 -> 16.7 -> 15.0 -> 17.4 -Likely Reactive  -Patient on IV Levofloxacin for PNA; Will Broaden and add IV Vancomyucin -Repeat CBC in AM  Thrombocytosis -Likely Reactive -Platelet Count went from 412 -> 448 -> 383 -> 402 -> 420 -Continue to Monitor and Repeat CBC in AM   ? Nonsustained VTACH -Electrolytes wNL with K+ at 4.5, Mag at 2.0, and  -C/w Telemetry -Metoprolol 25 mg po BID through Cortrak for now but unable to give now that patient removed Cortrak; Changed to IV   Severe Malnutrition in Context of Chronic Illness -Nutritionist evaluated  - Decreased TF rate to Osmolite 1.2 @ 60 ml/hr (1440 ml daily) Providing, 1728 kcals, 80 g Pro, 1181 ml fluid + free water at 200 ml q 8 hrs however patient pulled out Cortrak -Cortrak unable to be placed back this weekend so will give IVF at 50 mL/hr -Awaiting PEG Placement   DVT prophylaxis: Anticoagulated with Heparin gtt for now and resume Apixaban once able  Code Status: Partial CODE; OK with Intubation no Shocks or Compressions Family Communication: No family present at bedside Disposition Plan: CIR when Medically Stable for D/C  Consultants:   Interventional Radiology   Procedures:  ECHOCARDIOGRAM Study Conclusions  - Left ventricle: The cavity size was normal. Wall thickness was   increased in a pattern of mild LVH. Systolic function was normal.   The estimated ejection fraction was in the range of 60% to 65%.   Wall motion was normal; there were no  regional wall motion   abnormalities. - Mitral valve: There was mild regurgitation. - Left atrium: The atrium was severely dilated. - Right atrium: The atrium was mildly dilated. - Pulmonary arteries: Systolic pressure was mildly increased. PA   peak pressure: 33 mm Hg (S).   Antimicrobials:  Anti-infectives (From admission, onward)   Start     Dose/Rate Route Frequency Ordered Stop   09/24/17 0800  vancomycin (VANCOCIN) IVPB 750 mg/150 ml premix     750 mg 150 mL/hr over 60 Minutes Intravenous Every 24 hours 09/23/17 0817     09/23/17 0830  vancomycin (VANCOCIN) IVPB 750 mg/150 ml premix     750 mg 150 mL/hr over 60 Minutes Intravenous  Once 09/23/17 0807 09/23/17 1007   09/21/17 2200  levofloxacin (LEVAQUIN) IVPB 750 mg     750 mg 100 mL/hr over 90 Minutes Intravenous Every 48 hours 09/20/17 1030     09/20/17 0500  levofloxacin (LEVAQUIN) IVPB 500 mg  Status:  Discontinued     500 mg 100 mL/hr over 60 Minutes Intravenous Every 24 hours 09/19/17 1443 09/20/17 1030  09/19/17 1345  levofloxacin (LEVAQUIN) IVPB 750 mg  Status:  Discontinued     750 mg 100 mL/hr over 90 Minutes Intravenous Every 24 hours 09/19/17 1339 09/19/17 1444     Subjective: Seen and examined at beside and had no complaints. Had pulled out Cortak overnight. Denied any pain.   Objective: Vitals:   09/25/17 1200 09/25/17 1300 09/25/17 1426 09/25/17 1430  BP: (!) 115/38 (!) 121/55    Pulse: 71 83    Resp: 15 14    Temp:      TempSrc:      SpO2: 100% 100% 100% 100%  Weight:      Height:        Intake/Output Summary (Last 24 hours) at 09/25/2017 1519 Last data filed at 09/25/2017 0530 Gross per 24 hour  Intake 1733.15 ml  Output 450 ml  Net 1283.15 ml   Filed Weights   09/23/17 0500 09/24/17 0600 09/25/17 0251  Weight: 45.6 kg (100 lb 8.5 oz) 45.1 kg (99 lb 6.8 oz) 45.1 kg (99 lb 6.8 oz)   Examination: Physical Exam:  Constitutional: Thin frail and cachectic appearing Caucasian female in  NAD Eyes: Sclerae anicteric. Lids normal ENMT: External Ears normal. Cortrak is removed. MMM Neck: Supple with no JVD Respiratory: Diminished to auscultation with rhonchi in RUL and some mild crackles. No appreciable wheezing. Unlabored breathing Cardiovascular: Irregularly Irregular but normal rate. No appreciable Extremity edema Abdomen: Soft, NT, ND. Bowel sounds present. No appreciable masses GU: Deferred Musculoskeletal: No contractures; No cyanosis Skin: Warm and dry and no rashes on a limited skin eval Neurologic: Patient is aphasic and has Right Arm weakness.  Psychiatric: Awake and alert. Normal mood and affect  Data Reviewed: I have personally reviewed following labs and imaging studies  CBC: Recent Labs  Lab 09/19/17 0138  09/21/17 0420 09/22/17 0652 09/23/17 0445 09/24/17 0244 09/25/17 0407  WBC 14.6*   < > 9.9 13.1* 16.9* 15.0* 17.4*  NEUTROABS 11.4*  --   --  10.5* 13.7* 12.4* 14.5*  HGB 9.1*   < > 8.8* 9.5* 9.8* 9.9* 9.7*  HCT 28.4*   < > 27.7* 29.7* 32.1* 32.0* 31.2*  MCV 95.0   < > 95.8 96.4 98.5 96.4 96.0  PLT 417*   < > 412* 448* 383 402* 420*   < > = values in this interval not displayed.   Basic Metabolic Panel: Recent Labs  Lab 09/21/17 0854 09/22/17 0652 09/23/17 0445 09/24/17 0244 09/25/17 0407  NA 136 137 137 137 138  K 4.0 4.0 4.3 4.5 4.7  CL 100* 103 103 100* 102  CO2 26 25 23 28 27   GLUCOSE 144* 127* 114* 113* 100*  BUN 16 16 15 15 15   CREATININE 0.66 0.65 0.63 0.64 0.67  CALCIUM 7.7* 8.1* 8.3* 8.3* 8.6*  MG 2.0 2.0 2.0 2.0 2.0  PHOS 3.1 2.7 3.7 4.5 4.8*   GFR: Estimated Creatinine Clearance: 37.9 mL/min (by C-G formula based on SCr of 0.67 mg/dL). Liver Function Tests: Recent Labs  Lab 09/21/17 0854 09/22/17 0652 09/23/17 0445 09/24/17 0244 09/25/17 0407  AST 36 29 32 43* 35  ALT 50 41 39 49 46  ALKPHOS 95 85 91 86 78  BILITOT 0.5 0.5 0.5 0.3 0.5  PROT 5.2* 5.1* 5.5* 5.6* 5.3*  ALBUMIN 2.1* 2.1* 2.3* 2.2* 2.3*   No  results for input(s): LIPASE, AMYLASE in the last 168 hours. No results for input(s): AMMONIA in the last 168 hours. Coagulation Profile: No results for  input(s): INR, PROTIME in the last 168 hours. Cardiac Enzymes: Recent Labs  Lab 09/19/17 0743 09/19/17 1245 09/19/17 1418 09/19/17 1922 09/20/17 0137  TROPONINI 0.04* 0.03* 0.04* 0.04* 0.04*   BNP (last 3 results) Recent Labs    10/22/16 1135  PROBNP 410.0*   HbA1C: No results for input(s): HGBA1C in the last 72 hours. CBG: Recent Labs  Lab 09/24/17 2025 09/24/17 2339 09/25/17 0359 09/25/17 1119 09/25/17 1259  GLUCAP 101* 87 78 86 96   Lipid Profile: No results for input(s): CHOL, HDL, LDLCALC, TRIG, CHOLHDL, LDLDIRECT in the last 72 hours. Thyroid Function Tests: No results for input(s): TSH, T4TOTAL, FREET4, T3FREE, THYROIDAB in the last 72 hours. Anemia Panel: No results for input(s): VITAMINB12, FOLATE, FERRITIN, TIBC, IRON, RETICCTPCT in the last 72 hours. Sepsis Labs: Recent Labs  Lab 09/19/17 0407 09/19/17 0743  PROCALCITON  --  0.13  LATICACIDVEN 1.6 1.3    Recent Results (from the past 240 hour(s))  Culture, blood (Routine X 2) w Reflex to ID Panel     Status: None   Collection Time: 09/19/17  7:35 AM  Result Value Ref Range Status   Specimen Description BLOOD LEFT HAND  Final   Special Requests IN PEDIATRIC BOTTLE Blood Culture adequate volume  Final   Culture NO GROWTH 5 DAYS  Final   Report Status 09/24/2017 FINAL  Final  Culture, blood (Routine X 2) w Reflex to ID Panel     Status: None   Collection Time: 09/19/17  7:40 AM  Result Value Ref Range Status   Specimen Description BLOOD LEFT HAND  Final   Special Requests IN PEDIATRIC BOTTLE Blood Culture adequate volume  Final   Culture NO GROWTH 5 DAYS  Final   Report Status 09/24/2017 FINAL  Final  Respiratory Panel by PCR     Status: None   Collection Time: 09/19/17  2:17 PM  Result Value Ref Range Status   Adenovirus NOT DETECTED NOT  DETECTED Final   Coronavirus 229E NOT DETECTED NOT DETECTED Final   Coronavirus HKU1 NOT DETECTED NOT DETECTED Final   Coronavirus NL63 NOT DETECTED NOT DETECTED Final   Coronavirus OC43 NOT DETECTED NOT DETECTED Final   Metapneumovirus NOT DETECTED NOT DETECTED Final   Rhinovirus / Enterovirus NOT DETECTED NOT DETECTED Final   Influenza A NOT DETECTED NOT DETECTED Final   Influenza B NOT DETECTED NOT DETECTED Final   Parainfluenza Virus 1 NOT DETECTED NOT DETECTED Final   Parainfluenza Virus 2 NOT DETECTED NOT DETECTED Final   Parainfluenza Virus 3 NOT DETECTED NOT DETECTED Final   Parainfluenza Virus 4 NOT DETECTED NOT DETECTED Final   Respiratory Syncytial Virus NOT DETECTED NOT DETECTED Final   Bordetella pertussis NOT DETECTED NOT DETECTED Final   Chlamydophila pneumoniae NOT DETECTED NOT DETECTED Final   Mycoplasma pneumoniae NOT DETECTED NOT DETECTED Final  Culture, respiratory (NON-Expectorated)     Status: None (Preliminary result)   Collection Time: 09/19/17  3:43 PM  Result Value Ref Range Status   Specimen Description TRACHEAL ASPIRATE  Final   Special Requests NONE  Final   Gram Stain   Final    RARE WBC PRESENT, PREDOMINANTLY PMN RARE YEAST RARE GRAM POSITIVE COCCI IN PAIRS RARE SQUAMOUS EPITHELIAL CELLS PRESENT    Culture CULTURE REINCUBATED FOR BETTER GROWTH  Final   Report Status PENDING  Incomplete  Culture, Urine     Status: None   Collection Time: 09/19/17  5:21 PM  Result Value Ref Range Status  Specimen Description URINE, RANDOM  Final   Special Requests NONE  Final   Culture NO GROWTH  Final   Report Status 09/20/2017 FINAL  Final    Radiology Studies: Dg Chest Port 1 View  Result Date: 09/25/2017 CLINICAL DATA:  Followup pneumonia EXAM: PORTABLE CHEST 1 VIEW COMPARISON:  09/24/2017 FINDINGS: Artifact overlies chest. The patient's left upper extremity overlies the chest. Line for this, there is no suspected change in bilateral pneumonia most pronounced  in the right upper lobe and left lower lobe. IMPRESSION: Overlying artifact.  No change in bilateral pneumonia suspected. Electronically Signed   By: Nelson Chimes M.D.   On: 09/25/2017 07:52   Dg Chest Port 1 View  Result Date: 09/24/2017 CLINICAL DATA:  Shortness of breath; hx of HTN and emphysema EXAM: PORTABLE CHEST 1 VIEW COMPARISON:  09/23/2017 FINDINGS: Right upper lobe confluent airspace lung opacity is similar to the previous day's study. Hazy type right lower lung opacity is also stable. Lungs are hyperexpanded. There is mild interstitial thickening most evident in the bases, and scarring/atelectasis in the right lateral lung base. Interstitial thickening and central vascular congestion appears improved from the previous day's study. No new lung abnormalities. No definite pleural effusion.  No pneumothorax. Enteric feeding tube passes below the diaphragm, unchanged. IMPRESSION: 1. Mild improvement in overall lung aeration with a decrease in central vascular congestion and interstitial thickening. This suggests improved pulmonary edema. 2. Persistent right upper lobe consolidation, which suggests pneumonia. 3. No new abnormalities. 4. COPD. Electronically Signed   By: Lajean Manes M.D.   On: 09/24/2017 09:18   Scheduled Meds: . aspirin  300 mg Rectal Daily  . budesonide (PULMICORT) nebulizer solution  0.25 mg Nebulization BID  . chlorhexidine  15 mL Mouth Rinse BID  . free water  200 mL Per Tube Q8H  . ipratropium-albuterol  3 mL Nebulization TID  . mouth rinse  15 mL Mouth Rinse BID  . metoprolol tartrate  2.5 mg Intravenous Q6H  . pantoprazole (PROTONIX) IV  40 mg Intravenous Q12H   Continuous Infusions: . dextrose 5 % with KCl 20 mEq / L    . feeding supplement (OSMOLITE 1.2 CAL) 1,000 mL (09/25/17 0619)  . heparin 900 Units/hr (09/24/17 1046)  . levETIRAcetam Stopped (09/25/17 1400)  . levofloxacin (LEVAQUIN) IV Stopped (09/23/17 2350)  . sodium chloride    . vancomycin Stopped  (09/25/17 1250)    LOS: 6 days   Kerney Elbe, DO [facility] Pager [phone]  If 7PM-7AM, please contact night-coverage [email or web address] Password [email or web address] 09/25/2017, 3:19 PM

## 2017-09-25 NOTE — Progress Notes (Signed)
Writer was called into room by another Therapist, sports. Pt had pulled cortrak out of L nare. Tube feedings were stopped. On-call provider Hal Hope, MD notified. PO meds changed to IV. IR consult placed. Mittens placed on pt. Will continue to monitor. Clint Bolder, RN 09/24/17 09:15PM

## 2017-09-26 ENCOUNTER — Inpatient Hospital Stay (HOSPITAL_COMMUNITY): Payer: Medicare Other

## 2017-09-26 ENCOUNTER — Encounter (HOSPITAL_COMMUNITY): Payer: Self-pay | Admitting: Interventional Radiology

## 2017-09-26 HISTORY — PX: IR GASTROSTOMY TUBE MOD SED: IMG625

## 2017-09-26 LAB — CULTURE, RESPIRATORY W GRAM STAIN

## 2017-09-26 LAB — CBC WITH DIFFERENTIAL/PLATELET
BASOS ABS: 0 10*3/uL (ref 0.0–0.1)
BLASTS: 0 %
Band Neutrophils: 0 %
Basophils Relative: 0 %
Eosinophils Absolute: 0.5 10*3/uL (ref 0.0–0.7)
Eosinophils Relative: 5 %
HEMATOCRIT: 31.9 % — AB (ref 36.0–46.0)
Hemoglobin: 10 g/dL — ABNORMAL LOW (ref 12.0–15.0)
LYMPHS PCT: 13 %
Lymphs Abs: 1.3 10*3/uL (ref 0.7–4.0)
MCH: 30.2 pg (ref 26.0–34.0)
MCHC: 31.3 g/dL (ref 30.0–36.0)
MCV: 96.4 fL (ref 78.0–100.0)
MYELOCYTES: 1 %
Metamyelocytes Relative: 3 %
Monocytes Absolute: 0.7 10*3/uL (ref 0.1–1.0)
Monocytes Relative: 7 %
NEUTROS PCT: 71 %
NRBC: 0 /100{WBCs}
Neutro Abs: 7.5 10*3/uL (ref 1.7–7.7)
OTHER: 0 %
PLATELETS: 406 10*3/uL — AB (ref 150–400)
PROMYELOCYTES ABS: 0 %
RBC: 3.31 MIL/uL — ABNORMAL LOW (ref 3.87–5.11)
RDW: 16.4 % — AB (ref 11.5–15.5)
WBC: 10 10*3/uL (ref 4.0–10.5)

## 2017-09-26 LAB — GLUCOSE, CAPILLARY
GLUCOSE-CAPILLARY: 106 mg/dL — AB (ref 65–99)
GLUCOSE-CAPILLARY: 85 mg/dL (ref 65–99)
GLUCOSE-CAPILLARY: 84 mg/dL (ref 65–99)
Glucose-Capillary: 107 mg/dL — ABNORMAL HIGH (ref 65–99)
Glucose-Capillary: 135 mg/dL — ABNORMAL HIGH (ref 65–99)
Glucose-Capillary: 113 mg/dL — ABNORMAL HIGH (ref 65–99)
Glucose-Capillary: 74 mg/dL (ref 65–99)
Glucose-Capillary: 73 mg/dL (ref 65–99)

## 2017-09-26 LAB — CULTURE, RESPIRATORY: CULTURE: NORMAL

## 2017-09-26 LAB — COMPREHENSIVE METABOLIC PANEL
ALBUMIN: 2.2 g/dL — AB (ref 3.5–5.0)
ALT: 39 U/L (ref 14–54)
ANION GAP: 8 (ref 5–15)
AST: 25 U/L (ref 15–41)
Alkaline Phosphatase: 70 U/L (ref 38–126)
BILIRUBIN TOTAL: 0.6 mg/dL (ref 0.3–1.2)
BUN: 13 mg/dL (ref 6–20)
CO2: 25 mmol/L (ref 22–32)
Calcium: 8.4 mg/dL — ABNORMAL LOW (ref 8.9–10.3)
Chloride: 104 mmol/L (ref 101–111)
Creatinine, Ser: 0.62 mg/dL (ref 0.44–1.00)
GFR calc Af Amer: >60 mL/min (ref >60)
GFR calc non Af Amer: >60 mL/min (ref >60)
GLUCOSE: 84 mg/dL (ref 65–99)
POTASSIUM: 4.1 mmol/L (ref 3.5–5.1)
Sodium: 137 mmol/L (ref 135–145)
TOTAL PROTEIN: 5.5 g/dL — AB (ref 6.5–8.1)

## 2017-09-26 LAB — HEPARIN LEVEL (UNFRACTIONATED): HEPARIN UNFRACTIONATED: 0.57 [IU]/mL (ref 0.30–0.70)

## 2017-09-26 LAB — PHOSPHORUS: Phosphorus: 4.7 mg/dL — ABNORMAL HIGH (ref 2.5–4.6)

## 2017-09-26 LAB — MAGNESIUM: MAGNESIUM: 2 mg/dL (ref 1.7–2.4)

## 2017-09-26 MED ORDER — FENTANYL CITRATE (PF) 100 MCG/2ML IJ SOLN
INTRAMUSCULAR | Status: AC | PRN
  Administered 2017-09-26 (×2): 25 ug via INTRAVENOUS

## 2017-09-26 MED ORDER — HEPARIN (PORCINE) IN NACL 100-0.45 UNIT/ML-% IJ SOLN: 850.0000 [IU]/h | INTRAMUSCULAR | Status: DC

## 2017-09-26 MED ORDER — HEPARIN (PORCINE) IN NACL 100-0.45 UNIT/ML-% IJ SOLN
850.0000 [IU]/h | INTRAMUSCULAR | Status: AC
  Administered 2017-09-26: 850 [IU]/h via INTRAVENOUS
  Filled 2017-09-26: qty 250

## 2017-09-26 MED ORDER — MIDAZOLAM HCL 2 MG/2ML IJ SOLN
INTRAMUSCULAR | Status: AC | PRN
  Administered 2017-09-26 (×2): 0.5 mg via INTRAVENOUS

## 2017-09-26 MED ORDER — LIDOCAINE HCL 1 % IJ SOLN
INTRAMUSCULAR | Status: AC | PRN
  Administered 2017-09-26: 5 mL

## 2017-09-26 MED ORDER — LIDOCAINE HCL 1 % IJ SOLN
INTRAMUSCULAR | Status: AC
  Filled 2017-09-26: qty 20

## 2017-09-26 MED ORDER — GLUCAGON HCL RDNA (DIAGNOSTIC) 1 MG IJ SOLR
INTRAMUSCULAR | Status: AC
  Filled 2017-09-26: qty 1

## 2017-09-26 MED ORDER — SODIUM CHLORIDE 0.9 % IV SOLN
INTRAVENOUS | Status: AC | PRN
  Administered 2017-09-26: 10 mL/h via INTRAVENOUS

## 2017-09-26 MED ORDER — IOPAMIDOL (ISOVUE-300) INJECTION 61%
INTRAVENOUS | Status: AC
  Administered 2017-09-26: 15 mL
  Filled 2017-09-26: qty 50

## 2017-09-26 MED ORDER — FENTANYL CITRATE (PF) 100 MCG/2ML IJ SOLN
INTRAMUSCULAR | Status: AC
  Filled 2017-09-26: qty 2

## 2017-09-26 MED ORDER — MIDAZOLAM HCL 2 MG/2ML IJ SOLN
INTRAMUSCULAR | Status: AC
  Filled 2017-09-26: qty 2

## 2017-09-26 NOTE — Procedures (Signed)
Interventional Radiology Procedure Note  Procedure: Percutaneous gastrostomy  Complications: None  Estimated Blood Loss: < 10 mL  Findings: 20 Fr bumper retention gastrostomy tube placed with tip in body of stomach.  OK to use in 24 hours.  Venetia Night. Kathlene Cote, M.D Pager:  [phone]

## 2017-09-26 NOTE — Progress Notes (Addendum)
ANTICOAGULATION CONSULT NOTE   Pharmacy Consult for Heparin Indication: Afib and CVA  Patient Measurements: Height: 5\' 7"  (170.2 cm) Weight: 93 lb 0.6 oz (42.2 kg) IBW/kg (Calculated) : 61.6   Vital Signs: Temp: (P) 97.8 F (36.6 C) (12/10 1212) Temp Source: (P) Axillary (12/10 1212) BP: (P) 117/63 (12/10 1212) Pulse Rate: (P) 84 (12/10 1212)  Assessment: 85 YOF who continues on heparin for hx afib, new stroke 11/25 (PTA Eliquis) Had GIB on admit 11/20. Heparin resumed on 11/28 with lower heparin goal of 0.3-0.5units/mL.   Heparin gtt has been off since this am due to percutaneous gastrotomy tube placement. S/p gastrotomy tube placement ~ 12:00 today. Per IR ok to resume heparin infusion at this time.    Goal of Therapy:  Heparin level 0.3-0.5 units/ml Monitor platelets by anticoagulation protocol: Yes   Plan:  -Resume heparin infusion at 850 units/hr -Heparin level in am as planned  -Noted plans to transition to apixaban soon   Vincenza Hews, PharmD, BCPS 09/26/2017, 1:07 PM

## 2017-09-26 NOTE — Progress Notes (Signed)
Hypoglycemic Event  CBG: 63  Treatment: D50 IV 25 mL  Symptoms: None  Follow-up CBG: Time:0015 CBG Result:107  Possible Reasons for Event: Medication regimen: pt pulled out cortrak and D50w/20K paused to admin levaquin over 1 hour  Comments/MD notified: D5NS restarted/Kakrakandy on-call MD    Clint Bolder

## 2017-09-26 NOTE — Progress Notes (Signed)
Pharmacy Antibiotic Note Kayla Kent is a 83 y.o. female admitted on 09/19/2017. Currently on day 7 of Levaquin and day 4 of vancomycin.   Plan: 1. Obtain vancomycin trough prior to dose on 12/11 to evaluate current dosing regimen  2. Consider stopping Levaquin soon as on day 7 of therapy   Height: 5\' 7"  (170.2 cm) Weight: 93 lb 0.6 oz (42.2 kg) IBW/kg (Calculated) : 61.6  Temp (24hrs), Avg:97.7 F (36.5 C), Min:97.4 F (36.3 C), Max:98.2 F (36.8 C)  Recent Labs  Lab 09/22/17 0652 09/23/17 0445 09/24/17 0244 09/25/17 0407 09/26/17 0402  WBC 13.1* 16.9* 15.0* 17.4* 10.0  CREATININE 0.65 0.63 0.64 0.67 0.62    Estimated Creatinine Clearance: 35.5 mL/min (by C-G formula based on SCr of 0.62 mg/dL).    Allergies  Allergen Reactions  . Chocolate Hives  . Codeine Other (See Comments)    Patient states she acts crazy  . Diazepam Other (See Comments)    Patient states she acts crazy.  . Fruit & Vegetable Daily [Nutritional Supplements] Hives and Swelling    peaches  . Iohexol Hives     Code: HIVES, Desc: pt gets 13 hr pre-meds, Onset Date: [id number]   . Latex Hives  . Peach [Prunus Persica] Hives  . Peanut-Containing Drug Products Hives and Swelling  . Penicillins Hives, Itching and Swelling    Has patient had a PCN reaction causing immediate rash, facial/tongue/throat swelling, SOB or lightheadedness with hypotension: Yes Has patient had a PCN reaction causing severe rash involving mucus membranes or skin necrosis: No Has patient had a PCN reaction that required hospitalization No Has patient had a PCN reaction occurring within the last 10 years: No If all of the above answers are "NO", then may proceed with Cephalosporin use.   . Strawberry Extract Hives  . Sulfonamide Derivatives Hives    nausea  . Wheat Shortness Of Breath    Shortness of breath  . Wheat Bran Shortness Of Breath  . Sulfamethoxazole Hives  . Crestor [Rosuvastatin Calcium] Rash  . Iodine Rash     Antimicrobials this admission: Cipro 11/20>>11/21 Meropenem 11/21>>11/28  Vancomycin 11/21>>11/25; 12/7 >>  Levaquin 12/3 >>  Zyvox 11/25>>11/28  Microbiology results: 11/20 Blood x 2: negF 11/20 Urine: >100K Kleb pneumo - ESBL, sensitive to cipro, imi, gent  11/20 MRSA PCR: neg 11/22 Strep pneumo Ag: negative 11/25 BC x 2: negF  11/25 urine: negF 12/3 bcx: ngF 12/3 resp cx: resp flora  12/3 ucx: ngF    Thank you for allowing pharmacy to be a part of this patient's care.  Vincenza Hews, PharmD, BCPS 09/26/2017, 10:34 AM

## 2017-09-26 NOTE — Sedation Documentation (Signed)
Patient is resting comfortably. 

## 2017-09-26 NOTE — Progress Notes (Signed)
Daughter, [name], called and updated that plan is for pt to go to PEG today.  Appreciative of call and update.

## 2017-09-26 NOTE — Plan of Care (Signed)
Pt has had adequate urine output during the shift.   Pt has had no issues with skin breakdown during the shift. Pt has been repositioned throughout the shift.

## 2017-09-26 NOTE — Progress Notes (Signed)
Daughter called and updated that pt for procedure at ~1045.  Appreciative of the update.  Transport here to take pt to IR for PEG placement.

## 2017-09-26 NOTE — Progress Notes (Signed)
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    PT Comments    Pt admitted with above diagnosis. Pt currently with functional limitations due to balance and endurance deficits. Pt was able to ambulate a short distance with mod assist of 2 and cues.  Pt making progress.  Continues to have poor motor control of right UE and needs cues for use and placement.    Pt will benefit from skilled PT to increase their independence and safety with mobility to allow discharge to the venue listed below.     Follow Up Recommendations  CIR     Equipment Recommendations  None recommended by PT    Recommendations for Other Services Rehab consult     Precautions / Restrictions Precautions Precautions: Fall Precaution Comments: R hemiplegia Restrictions Weight Bearing Restrictions: No    Mobility  Bed Mobility Overal bed mobility: Needs Assistance Bed Mobility: Rolling;Supine to Sit;Sit to Supine Rolling: Mod assist Sidelying to sit: Mod assist Supine to sit: Mod assist Sit to supine: Min assist   General bed mobility comments: pt need assist to come forward, then assisted minimally to scoot to EOB  Transfers Overall transfer level: Needs assistance Equipment used: 2 person hand held assist Transfers: Sit to/from Stand Sit to Stand: Max assist Stand pivot transfers: Max assist       General transfer comment: NEeded assist to power up and was slightly flexed as well as heavy posterior and right lateral lean.   Assisted to 3N1 first as pt had urinated in bed and stated she needed to use the 3N1.  Pt cleaned up and bed changed.  She did not go in 3N1.     Ambulation/Gait Ambulation/Gait assistance: Min assist;Mod assist;+2 safety/equipment Ambulation Distance (Feet): 30 Feet Assistive device: 2 person hand held assist Gait Pattern/deviations: Shuffle;Trunk flexed;Narrow base of support;Step-through pattern;Decreased step length - right;Decreased step length - left;Ataxic   Gait velocity interpretation: Below normal speed for age/gender General Gait Details: Slightly flexed trunk needing constant cues to stand upright.  Pt with short steps and slightly ataxic and scissoring.  Pt leans right and posterior therefore giving faciliatation at trunk.  Pt also pushes with left UE for balance but tends to push away thus causing her to lean > to right.  Overall poor balance with definite need of external support.     Stairs            Wheelchair Mobility    Modified Rankin (Stroke Patients Only) Modified Rankin (Stroke Patients Only) Pre-Morbid Rankin Score: No symptoms Modified Rankin: Severe disability     Balance Overall balance assessment: Needs assistance Sitting-balance support: No upper extremity supported Sitting balance-Leahy Scale: Fair Sitting balance - Comments: Initially max assist and progressed to min guard assist to sit EOB.  Postural control: Posterior lean;Right lateral lean Standing balance support: During functional activity;Single extremity supported Standing balance-Leahy Scale: Poor Standing balance comment: kyphotic posture with mod assist for standing balance with flexed posture as well as posterior and right lateral lean                            Cognition Arousal/Alertness:  Awake/alert Behavior During Therapy: Flat affect Overall Cognitive Status: Difficult to assess(aphasia) Area of Impairment: Attention;Orientation;Following commands;Safety/judgement;Problem solving;Awareness;Memory                 Orientation Level: Time Current Attention Level: Sustained   Following Commands:  Follows one step commands with increased time Safety/Judgement: Decreased awareness of deficits;Decreased awareness of safety Awareness: Intellectual Problem Solving: Decreased initiation;Slow processing General Comments: No verbalizations      Exercises General Exercises - Lower Extremity Ankle Circles/Pumps: AROM;Both;5 reps;Seated Long Arc Quad: AROM;Both;10 reps;Seated Hip Flexion/Marching: AROM;Both;5 reps;Seated    General Comments General comments (skin integrity, edema, etc.): HR 90-136 bpm.      Pertinent Vitals/Pain Pain Assessment: No/denies pain Faces Pain Scale: No hurt    Home Living                      Prior Function            PT Goals (current goals can now be found in the care plan section) Progress towards PT goals: Progressing toward goals    Frequency    Min 3X/week      PT Plan Current plan remains appropriate    Co-evaluation              AM-PAC PT "6 Clicks" Daily Activity  Outcome Measure  Difficulty turning over in bed (including adjusting bedclothes, sheets and blankets)?: Unable Difficulty moving from lying on back to sitting on the side of the bed? : Unable Difficulty sitting down on and standing up from a chair with arms (e.g., wheelchair, bedside commode, etc,.)?: Unable Help needed moving to and from a bed to chair (including a wheelchair)?: A Lot Help needed walking in hospital room?: A Lot Help needed climbing 3-5 steps with a railing? : Total 6 Click Score: 8    End of Session Equipment Utilized During Treatment: Gait belt Activity Tolerance: Patient limited by fatigue Patient left: with call bell/phone within reach;in bed;with bed alarm set(Pt going for PEG and nursing asked to get pt back in bed.) Nurse Communication: Mobility status PT Visit Diagnosis: Muscle weakness (generalized) (M62.81);Hemiplegia and hemiparesis;Other abnormalities of gait and mobility (R26.89) Hemiplegia - Right/Left:  Right Hemiplegia - dominant/non-dominant: Dominant Hemiplegia - caused by: Cerebral infarction     Time: 8466-5993 PT Time Calculation (min) (ACUTE ONLY): 23 min  Charges:  $Gait Training: 8-22 mins $Therapeutic Exercise: 8-22 mins                    G Codes:       Phong Isenberg,PT Acute Rehabilitation [phone]   Denice Paradise 09/26/2017, 10:28 AM

## 2017-09-26 NOTE — Progress Notes (Signed)
SLP Cancellation Note  Patient Details Name: Kayla Kent MRN: [id number] DOB: 02-Mar-1932   Cancelled treatment:       Reason Eval/Treat Not Completed: Patient at procedure or test/unavailable;Medical issues which prohibited therapy. Pt NPO this morning awaiting PEG placement. Transport on the unit to take her - will f/u as able.   Germain Osgood 09/26/2017, 10:36 AM  Germain Osgood, M.A. CCC-SLP [phone]

## 2017-09-26 NOTE — Progress Notes (Addendum)
ANTICOAGULATION CONSULT NOTE - Follow Up Consult  Pharmacy Consult for Heparin Indication: Afib and CVA  Patient Measurements: Height: 5\' 7"  (170.2 cm) Weight: 93 lb 0.6 oz (42.2 kg) IBW/kg (Calculated) : 61.6   Vital Signs: Temp: 97.4 F (36.3 C) (12/10 0747) Temp Source: Axillary (12/10 0747) BP: 154/50 (12/10 0800) Pulse Rate: 82 (12/10 0800)  Assessment: 33 YOF who continues on heparin for hx afib, new stroke 11/25 (PTA Eliquis) Had GIB on admit 11/20. Heparin resumed on 11/28 with lower heparin goal of 0.3-0.5units/mL.   Heparin level this morning is slightly higher than desired at 0.57. CBC stable with no reported bleeding.   Goal of Therapy:  Heparin level 0.3-0.5 units/ml Monitor platelets by anticoagulation protocol: Yes   Plan:  -Heparin currently off due to planned PEG tube placement today  -Will follow up PEG tube placement and resume heparin infusion at lower rate post procedure     Vincenza Hews, PharmD, BCPS 09/26/2017, 10:25 AM

## 2017-09-26 NOTE — Progress Notes (Signed)
PROGRESS NOTE    Kayla Kent  IWL:[id number] DOB: [birth year] DOA: 09/19/2017 PCP: Biagio Borg, MD   Brief Narrative:  Kayla Kent a 83 y.o.femalewith medical history significant for stage III chronic kidney disease, dyslipidemia, chronic atrial fibrillation previously on Eliquis, hypertension, seizure disorder on Keppra, and recent outpatient fine-needle aspiration of thyroid nodule.  Patient was initially admitted on 11/20 secondary to acute upper GI bleeding Heme Positive as well as multidrug-resistant Klebsiella pneumonia.   On 11/25 patient was aphasic with respiratory distress and was later found to have an acute MCA stroke.  Her eliquis had been held due to the GI bleed.  11/25: underwent thrombolysis of left middle cerebral artery She was treated for aspiration pneumonia as well and was intubated. She was eventually discharged to CIR on 11/30 On 12/3 she was evaluated for fever and suspected to have continued Aspiration pneumonia and readmitted to the hospital. On 12/5 she underwent MBS again and failed and will continue TF via Cortrack.  12/6 IR Consulted for PEG Placement. Awaiting to be done and discussed with Dr. Laurence Ferrari of IR and states it may not be done until early next week. Per IR note likely to be done on Tuesday 09/27/17.  12/8-12/9 overnight patient Pulled out Cortrak.  12/10 Patient to get PEG tube Placement   Assessment & Plan:   Active Problems:   Anxiety   Essential hypertension   Atrial fibrillation with RVR (HCC)   Acute renal failure (HCC)   Gastroesophageal reflux disease   CKD (chronic kidney disease) stage 3, GFR 30-59 ml/min (HCC)   Chronic atrial fibrillation (HCC)   Dyslipidemia   COPD (chronic obstructive pulmonary disease) (HCC)   Acute hypoxemic respiratory failure (HCC)   Cerebral thrombosis with cerebral infarction   Aspiration pneumonia due to gastric secretions (HCC)   Acute ischemic left MCA stroke (HCC)   Acute respiratory  failure with hypoxia (HCC)   Dysphagia  Acute respiratory failure with hypoxia (HCC) 2/2 to Aspiration PNA - Leukocytosis went from 14.6 -> 16.7 -> 9.9 -> 13.1 -> 16.9 -> 15.0 -> 17.4 -> 10.0 - Possible Aspiration Pneumonia again- low grade fever yesterday with possible Hypoxia- now is 100% on 2 L O2 - Apparently completed 5 days of antibiotics on 11/25 - On last admission: sputum and blood cultures were negative & resp virus panel was Negative - Respiratory Virus Panel Negative - WBC worsened to 17.4 as patient pulled out Cortrak - Blood Cx x2 show NGTD at 5 Days - Now on Levaquin- will continue to follow; Will broaden Abx Covearge and add Vancomycin  - CXR yesterday AM showed The patient's left upper extremity overlies the chest. Line for this, there is no suspected change in bilateral pneumonia most pronounced in the right upper lobe and left lower lobe. - Repeat CXR in AM   Recent CVA with Dysphagia - Will return to CIR when stable - has been on IV Heparin at CIR- continue for now until PEG in Place -Tube feeds through Cortrack discontinued for now as patient pulled out Cotrak  - Repeat MBS showed that patient did not pass  - Dietician evaluated and adjusted TF - IR evaluated 12/8 and PEG to be placed this AM; S/p PEG Tube by Dr. Kathlene Cote and ok to use in 24 hours  - Gave IVF with D5W at 50 mL/hr x 10 hours   A-Fib/ Flutter -Was not on rate controlling agent- cont Heparin for now and restart Eliquis when Appropriate through PEG  Tube -Liquid Metoprolol 25 mg BID through Cortrak changed to IV yesterday; Will change back once ok to use PEG   HTN (hypertension) -Current blood pressure stable but having low diastolics -Given Gentle IVF bolus and started D5W at 50 mL/hr -Hold previous Clonidine for now -Can utilize hydralazine IV prn for Episodic Hypertension -Started Patient on Metoprolol 25 mg BID but unable to give because patient pulled out Cortrak so changed to IV  CKD  (chronic kidney disease), stage III  -Renal function stable and at baseline -BUN/Cr was 15/0.63 -> 15/0.64 -> 15/0.67 -> 13/0.62  Seizure Disorder -Cont IV Keppra for Now until able to through PEG when it is ok to use  GI bleed? -EGD was normal on 11/23  Thyroid nodule -Recent FNA of thyroid nodule with indeterminate pathology; testing ordered by PCP and patient can follow-up with this physician to discuss significance of findings  Dyslipidemia -Statin on hold until can reintroduce tube feedings through PEG  Leukocytosis, improved -WBC went from 9.9 -> 13.1 -> 16.7 -> 15.0 -> 17.4 -> 10.0 -Likely Reactive  -Patient on IV Levofloxacin for PNA; Will Broaden and add IV Vancomyucin -Repeat CBC in AM  Thrombocytosis -Likely Reactive -Platelet Count went from 412 -> 448 -> 383 -> 402 -> 420 -> 406 -Continue to Monitor and Repeat CBC in AM   ? Nonsustained VTACH -Electrolytes wNL with K+ at 4.1, Mag at 2.0, and  -C/w Telemetry -Metoprolol 25 mg po BID through Cortrak for now but unable to give now that patient removed Cortrak; Changed to IV and will give through PEG  Severe Malnutrition in Context of Chronic Illness -Nutritionist evaluated  -TF Osmolite 1.2 @ 60 ml/hr (1440 ml daily) Providing, 1728 kcals, 80 g Pro, 1181 ml fluid + free water at 200 ml q 8 hrs Stopped for now until ok to use through PEG Tube -Cortrak unable to be placed back this weekend so gave IVF at 50 mL/hr yesterday  -Awaiting PEG Placement   DVT prophylaxis: Anticoagulated with Heparin gtt for now and resume Apixaban once able through PEG Code Status: Partial CODE; OK with Intubation no Shocks or Compressions Family Communication: No family present at bedside Disposition Plan: CIR when Medically Stable for D/C  Consultants:   Interventional Radiology   Procedures:  ECHOCARDIOGRAM Study Conclusions  - Left ventricle: The cavity size was normal. Wall thickness was   increased in a pattern of mild  LVH. Systolic function was normal.   The estimated ejection fraction was in the range of 60% to 65%.   Wall motion was normal; there were no regional wall motion   abnormalities. - Mitral valve: There was mild regurgitation. - Left atrium: The atrium was severely dilated. - Right atrium: The atrium was mildly dilated. - Pulmonary arteries: Systolic pressure was mildly increased. PA   peak pressure: 33 mm Hg (S).   Antimicrobials:  Anti-infectives (From admission, onward)   Start     Dose/Rate Route Frequency Ordered Stop   09/24/17 0800  vancomycin (VANCOCIN) IVPB 750 mg/150 ml premix     750 mg 150 mL/hr over 60 Minutes Intravenous Every 24 hours 09/23/17 0817     09/23/17 0830  vancomycin (VANCOCIN) IVPB 750 mg/150 ml premix     750 mg 150 mL/hr over 60 Minutes Intravenous  Once 09/23/17 0807 09/23/17 1007   09/21/17 2200  levofloxacin (LEVAQUIN) IVPB 750 mg     750 mg 100 mL/hr over 90 Minutes Intravenous Every 48 hours 09/20/17 1030  09/20/17 0500  levofloxacin (LEVAQUIN) IVPB 500 mg  Status:  Discontinued     500 mg 100 mL/hr over 60 Minutes Intravenous Every 24 hours 09/19/17 1443 09/20/17 1030   09/19/17 1345  levofloxacin (LEVAQUIN) IVPB 750 mg  Status:  Discontinued     750 mg 100 mL/hr over 90 Minutes Intravenous Every 24 hours 09/19/17 1339 09/19/17 1444     Subjective: Seen and examined at beside and was actually able to verbalize yes. States she was not in any pain but was cold. No CP or SOB. Still cannot use Right Hand completely.   Objective: Vitals:   09/26/17 1140 09/26/17 1145 09/26/17 1158 09/26/17 1212  BP: (!) 143/70 (!) 152/55 (!) 122/57 (P) 117/63  Pulse: (!) 105 (!) 104 93 (P) 84  Resp: 15 18 13  (P) 12  Temp:    (P) 97.8 F (36.6 C)  TempSrc:    (P) Axillary  SpO2: 92% 96% 91% (P) 98%  Weight:      Height:        Intake/Output Summary (Last 24 hours) at 09/26/2017 1716 Last data filed at 09/26/2017 0540 Gross per 24 hour  Intake 780.67 ml    Output 825 ml  Net -44.33 ml   Filed Weights   09/24/17 0600 09/25/17 0251 09/26/17 0300  Weight: 45.1 kg (99 lb 6.8 oz) 45.1 kg (99 lb 6.8 oz) 42.2 kg (93 lb 0.6 oz)   Examination: Physical Exam:  Constitutional: Thin frail and cachetic appearing Caucasian female in NAD Eyes: Sclerae anicteric. Lids normal ENMT: External ears appear normal Neck: Supple with no JVD Respiratory: Diminished to auscultation with rhonchi. Patient was not tachypenic and had unlabored breathing.  Cardiovascular: Irregularly Irregular. No appreciable extremity edema Abdomen: Soft, NT, ND. Bowel sounds present. No appreciable masses GU: Deferred Musculoskeletal: No contractures; No cyanosis Skin: Warm and Dry; No rashes or lesions on a limited skin eval Neurologic: Patient remains aphasic and Right arm and hand still weak and still has no grip strength Psychiatric: Awake and alert. Normal Mood and affect  Data Reviewed: I have personally reviewed following labs and imaging studies  CBC: Recent Labs  Lab 09/22/17 0652 09/23/17 0445 09/24/17 0244 09/25/17 0407 09/26/17 0402  WBC 13.1* 16.9* 15.0* 17.4* 10.0  NEUTROABS 10.5* 13.7* 12.4* 14.5* 7.5  HGB 9.5* 9.8* 9.9* 9.7* 10.0*  HCT 29.7* 32.1* 32.0* 31.2* 31.9*  MCV 96.4 98.5 96.4 96.0 96.4  PLT 448* 383 402* 420* 174*   Basic Metabolic Panel: Recent Labs  Lab 09/22/17 0652 09/23/17 0445 09/24/17 0244 09/25/17 0407 09/26/17 0402  NA 137 137 137 138 137  K 4.0 4.3 4.5 4.7 4.1  CL 103 103 100* 102 104  CO2 25 23 28 27 25   GLUCOSE 127* 114* 113* 100* 84  BUN 16 15 15 15 13   CREATININE 0.65 0.63 0.64 0.67 0.62  CALCIUM 8.1* 8.3* 8.3* 8.6* 8.4*  MG 2.0 2.0 2.0 2.0 2.0  PHOS 2.7 3.7 4.5 4.8* 4.7*   GFR: Estimated Creatinine Clearance: 35.5 mL/min (by C-G formula based on SCr of 0.62 mg/dL). Liver Function Tests: Recent Labs  Lab 09/22/17 0652 09/23/17 0445 09/24/17 0244 09/25/17 0407 09/26/17 0402  AST 29 32 43* 35 25  ALT 41 39  49 46 39  ALKPHOS 85 91 86 78 70  BILITOT 0.5 0.5 0.3 0.5 0.6  PROT 5.1* 5.5* 5.6* 5.3* 5.5*  ALBUMIN 2.1* 2.3* 2.2* 2.3* 2.2*   No results for input(s): LIPASE, AMYLASE in the last 168  hours. No results for input(s): AMMONIA in the last 168 hours. Coagulation Profile: No results for input(s): INR, PROTIME in the last 168 hours. Cardiac Enzymes: Recent Labs  Lab 09/19/17 1922 09/20/17 0137  TROPONINI 0.04* 0.04*   BNP (last 3 results) Recent Labs    10/22/16 1135  PROBNP 410.0*   HbA1C: No results for input(s): HGBA1C in the last 72 hours. CBG: Recent Labs  Lab 09/25/17 2331 09/26/17 0015 09/26/17 0315 09/26/17 0735 09/26/17 1218  GLUCAP 63* 107* 85 84 113*   Lipid Profile: No results for input(s): CHOL, HDL, LDLCALC, TRIG, CHOLHDL, LDLDIRECT in the last 72 hours. Thyroid Function Tests: No results for input(s): TSH, T4TOTAL, FREET4, T3FREE, THYROIDAB in the last 72 hours. Anemia Panel: No results for input(s): VITAMINB12, FOLATE, FERRITIN, TIBC, IRON, RETICCTPCT in the last 72 hours. Sepsis Labs: No results for input(s): PROCALCITON, LATICACIDVEN in the last 168 hours.  Recent Results (from the past 240 hour(s))  Culture, blood (Routine X 2) w Reflex to ID Panel     Status: None   Collection Time: 09/19/17  7:35 AM  Result Value Ref Range Status   Specimen Description BLOOD LEFT HAND  Final   Special Requests IN PEDIATRIC BOTTLE Blood Culture adequate volume  Final   Culture NO GROWTH 5 DAYS  Final   Report Status 09/24/2017 FINAL  Final  Culture, blood (Routine X 2) w Reflex to ID Panel     Status: None   Collection Time: 09/19/17  7:40 AM  Result Value Ref Range Status   Specimen Description BLOOD LEFT HAND  Final   Special Requests IN PEDIATRIC BOTTLE Blood Culture adequate volume  Final   Culture NO GROWTH 5 DAYS  Final   Report Status 09/24/2017 FINAL  Final  Respiratory Panel by PCR     Status: None   Collection Time: 09/19/17  2:17 PM  Result Value  Ref Range Status   Adenovirus NOT DETECTED NOT DETECTED Final   Coronavirus 229E NOT DETECTED NOT DETECTED Final   Coronavirus HKU1 NOT DETECTED NOT DETECTED Final   Coronavirus NL63 NOT DETECTED NOT DETECTED Final   Coronavirus OC43 NOT DETECTED NOT DETECTED Final   Metapneumovirus NOT DETECTED NOT DETECTED Final   Rhinovirus / Enterovirus NOT DETECTED NOT DETECTED Final   Influenza A NOT DETECTED NOT DETECTED Final   Influenza B NOT DETECTED NOT DETECTED Final   Parainfluenza Virus 1 NOT DETECTED NOT DETECTED Final   Parainfluenza Virus 2 NOT DETECTED NOT DETECTED Final   Parainfluenza Virus 3 NOT DETECTED NOT DETECTED Final   Parainfluenza Virus 4 NOT DETECTED NOT DETECTED Final   Respiratory Syncytial Virus NOT DETECTED NOT DETECTED Final   Bordetella pertussis NOT DETECTED NOT DETECTED Final   Chlamydophila pneumoniae NOT DETECTED NOT DETECTED Final   Mycoplasma pneumoniae NOT DETECTED NOT DETECTED Final  Culture, respiratory (NON-Expectorated)     Status: None   Collection Time: 09/19/17  3:43 PM  Result Value Ref Range Status   Specimen Description TRACHEAL ASPIRATE  Final   Special Requests NONE  Final   Gram Stain   Final    RARE WBC PRESENT, PREDOMINANTLY PMN RARE YEAST RARE GRAM POSITIVE COCCI IN PAIRS RARE SQUAMOUS EPITHELIAL CELLS PRESENT    Culture Consistent with normal respiratory flora.  Final   Report Status 09/26/2017 FINAL  Final  Culture, Urine     Status: None   Collection Time: 09/19/17  5:21 PM  Result Value Ref Range Status   Specimen Description URINE,  RANDOM  Final   Special Requests NONE  Final   Culture NO GROWTH  Final   Report Status 09/20/2017 FINAL  Final    Radiology Studies: Ir Gastrostomy Tube Mod Sed  Result Date: 09/26/2017 CLINICAL DATA:  Cerebral infarction and need for percutaneous gastrostomy tube for nutrition. EXAM: PERCUTANEOUS GASTROSTOMY TUBE PLACEMENT ANESTHESIA/SEDATION: 1.0 mg IV Versed; 50 mcg IV Fentanyl. Total Moderate  Sedation Time 10 minutes. The patient's level of consciousness and physiologic status were continuously monitored during the procedure by Radiology nursing. CONTRAST:  5 mL Isovue 300 contrast injected into the lumen of the stomach. MEDICATIONS: No additional medications. The patient is currently receiving IV vancomycin daily and additional prophylactic antibiotic was not given for the procedure. FLUOROSCOPY TIME:  4 minutes and 30 seconds.  3.3 mGy. PROCEDURE: The procedure, risks, benefits, and alternatives were explained to the patient's daughter. Questions regarding the procedure were encouraged and answered. The patient's daughter understands and consents to the procedure. A 5-French catheter was then advanced through the the patient's mouth under fluoroscopy into the esophagus and to the level of the stomach. This catheter was used to insufflate the stomach with air under fluoroscopy. The abdominal wall was prepped with chlorhexidine in a sterile fashion, and a sterile drape was applied covering the operative field. A sterile gown and sterile gloves were used for the procedure. Local anesthesia was provided with 1% Lidocaine. A skin incision was made in the upper abdominal wall. Under fluoroscopy, an 18 gauge trocar needle was advanced into the stomach. Contrast injection was performed to confirm intraluminal position of the needle tip. A single T tack was then deployed in the lumen of the stomach. This was brought up to tension at the skin surface. Over a guidewire, a 9-French sheath was advanced into the lumen of the stomach. The wire was left in place as a safety wire. A loop snare device from a percutaneous gastrostomy kit was then advanced into the stomach. A floppy guide wire was advanced through the orogastric catheter under fluoroscopy in the stomach. The loop snare advanced through the percutaneous gastric access was used to snare the guide wire. This allowed withdrawal of the loop snare out of the  patient's mouth by retraction of the orogastric catheter and wire. A 20-French bumper retention gastrostomy tube was looped around the snare device. It was then pulled back through the patient's mouth. The retention bumper was brought up to the anterior gastric wall. The T tack suture was cut at the skin. The exiting gastrostomy tube was cut to appropriate length and a feeding adapter applied. The catheter was injected with contrast material to confirm position and a fluoroscopic spot image saved. The tube was then flushed with saline. A dressing was applied over the gastrostomy exit site. COMPLICATIONS: None. FINDINGS: The stomach distended well with air allowing safe placement of the gastrostomy tube. After placement, the tip of the gastrostomy tube lies in the body of the stomach. IMPRESSION: Percutaneous gastrostomy with placement of a 20-French bumper retention tube in the body of the stomach. This tube can be used beginning in 24 hours after placement. Electronically Signed   By: Aletta Edouard M.D.   On: 09/26/2017 12:10   Dg Chest Port 1 View  Result Date: 09/25/2017 CLINICAL DATA:  Followup pneumonia EXAM: PORTABLE CHEST 1 VIEW COMPARISON:  09/24/2017 FINDINGS: Artifact overlies chest. The patient's left upper extremity overlies the chest. Line for this, there is no suspected change in bilateral pneumonia most pronounced in the  right upper lobe and left lower lobe. IMPRESSION: Overlying artifact.  No change in bilateral pneumonia suspected. Electronically Signed   By: Nelson Chimes M.D.   On: 09/25/2017 07:52   Scheduled Meds: . aspirin  300 mg Rectal Daily  . budesonide (PULMICORT) nebulizer solution  0.25 mg Nebulization BID  . chlorhexidine  15 mL Mouth Rinse BID  . fentaNYL      . free water  200 mL Per Tube Q8H  . ipratropium-albuterol  3 mL Nebulization TID  . lidocaine      . mouth rinse  15 mL Mouth Rinse BID  . metoprolol tartrate  2.5 mg Intravenous Q6H  . midazolam      .  pantoprazole (PROTONIX) IV  40 mg Intravenous Q12H   Continuous Infusions: . dextrose 5 % and 0.9% NaCl 50 mL/hr at 09/26/17 0833  . feeding supplement (OSMOLITE 1.2 CAL) Stopped (09/25/17 0700)  . heparin 850 Units/hr (09/26/17 1423)  . levETIRAcetam Stopped (09/26/17 1422)  . levofloxacin (LEVAQUIN) IV Stopped (09/25/17 2312)  . vancomycin Stopped (09/26/17 0955)    LOS: 7 days   Kerney Elbe, DO [facility] Pager [phone]  If 7PM-7AM, please contact night-coverage [email or web address] Password [email or web address] 09/26/2017, 5:16 PM

## 2017-09-27 LAB — CBC WITH DIFFERENTIAL/PLATELET
Basophils Absolute: 0 10*3/uL (ref 0.0–0.1)
Basophils Relative: 1 %
EOS PCT: 2 %
Eosinophils Absolute: 0.1 10*3/uL (ref 0.0–0.7)
HCT: 29.2 % — ABNORMAL LOW (ref 36.0–46.0)
Hemoglobin: 9.1 g/dL — ABNORMAL LOW (ref 12.0–15.0)
LYMPHS ABS: 1.2 10*3/uL (ref 0.7–4.0)
LYMPHS PCT: 16 %
MCH: 30.5 pg (ref 26.0–34.0)
MCHC: 31.2 g/dL (ref 30.0–36.0)
MCV: 98 fL (ref 78.0–100.0)
MONO ABS: 1 10*3/uL (ref 0.1–1.0)
MONOS PCT: 13 %
Neutro Abs: 5.3 10*3/uL (ref 1.7–7.7)
Neutrophils Relative %: 70 %
PLATELETS: 366 10*3/uL (ref 150–400)
RBC: 2.98 MIL/uL — ABNORMAL LOW (ref 3.87–5.11)
RDW: 16.6 % — AB (ref 11.5–15.5)
WBC: 7.7 10*3/uL (ref 4.0–10.5)

## 2017-09-27 LAB — GLUCOSE, CAPILLARY
GLUCOSE-CAPILLARY: 86 mg/dL (ref 65–99)
GLUCOSE-CAPILLARY: 148 mg/dL — AB (ref 65–99)
GLUCOSE-CAPILLARY: 130 mg/dL — AB (ref 65–99)
Glucose-Capillary: 118 mg/dL — ABNORMAL HIGH (ref 65–99)
Glucose-Capillary: 75 mg/dL (ref 65–99)
Glucose-Capillary: 78 mg/dL (ref 65–99)
Glucose-Capillary: 89 mg/dL (ref 65–99)

## 2017-09-27 LAB — COMPREHENSIVE METABOLIC PANEL
ALT: 27 U/L (ref 14–54)
AST: 19 U/L (ref 15–41)
Albumin: 2 g/dL — ABNORMAL LOW (ref 3.5–5.0)
Alkaline Phosphatase: 61 U/L (ref 38–126)
Anion gap: 5 (ref 5–15)
BUN: 11 mg/dL (ref 6–20)
CHLORIDE: 107 mmol/L (ref 101–111)
CO2: 26 mmol/L (ref 22–32)
CREATININE: 0.63 mg/dL (ref 0.44–1.00)
Calcium: 8.1 mg/dL — ABNORMAL LOW (ref 8.9–10.3)
Glucose, Bld: 90 mg/dL (ref 65–99)
Potassium: 3.9 mmol/L (ref 3.5–5.1)
Sodium: 138 mmol/L (ref 135–145)
Total Bilirubin: 0.4 mg/dL (ref 0.3–1.2)
Total Protein: 4.8 g/dL — ABNORMAL LOW (ref 6.5–8.1)

## 2017-09-27 LAB — PHOSPHORUS: PHOSPHORUS: 4.1 mg/dL (ref 2.5–4.6)

## 2017-09-27 LAB — MAGNESIUM: MAGNESIUM: 1.8 mg/dL (ref 1.7–2.4)

## 2017-09-27 LAB — HEPARIN LEVEL (UNFRACTIONATED): Heparin Unfractionated: 0.44 IU/mL (ref 0.30–0.70)

## 2017-09-27 LAB — VANCOMYCIN, TROUGH: Vancomycin Tr: 11 ug/mL — ABNORMAL LOW (ref 15–20)

## 2017-09-27 MED ORDER — SALINE SPRAY 0.65 % NA SOLN
1.0000 | NASAL | Status: DC | PRN
  Filled 2017-09-27: qty 44

## 2017-09-27 MED ORDER — VANCOMYCIN HCL IN DEXTROSE 1-5 GM/200ML-% IV SOLN
1000.0000 mg | INTRAVENOUS | Status: DC
  Administered 2017-09-28: 1000 mg via INTRAVENOUS
  Filled 2017-09-27: qty 200

## 2017-09-27 MED ORDER — ASPIRIN 325 MG PO TABS
325.0000 mg | ORAL_TABLET | Freq: Every day | ORAL | Status: DC
  Administered 2017-09-28: 325 mg
  Filled 2017-09-27: qty 1

## 2017-09-27 MED ORDER — ATORVASTATIN CALCIUM 20 MG PO TABS: 20.0000 mg | ORAL_TABLET | Freq: Every day | ORAL | Status: DC

## 2017-09-27 MED ORDER — PANTOPRAZOLE SODIUM 40 MG PO PACK
40.0000 mg | PACK | Freq: Two times a day (BID) | ORAL | Status: DC
  Administered 2017-09-27 – 2017-09-28 (×2): 40 mg
  Filled 2017-09-27 (×2): qty 20

## 2017-09-27 MED ORDER — LEVETIRACETAM 100 MG/ML PO SOLN
250.0000 mg | Freq: Two times a day (BID) | ORAL | Status: DC
  Administered 2017-09-27 – 2017-09-28 (×2): 250 mg
  Filled 2017-09-27 (×2): qty 2.5

## 2017-09-27 MED ORDER — METOPROLOL TARTRATE 25 MG/10 ML ORAL SUSPENSION
25.0000 mg | Freq: Two times a day (BID) | ORAL | Status: DC
  Administered 2017-09-27 – 2017-09-28 (×2): 25 mg
  Filled 2017-09-27 (×2): qty 10

## 2017-09-27 MED ORDER — ATORVASTATIN CALCIUM 20 MG PO TABS
20.0000 mg | ORAL_TABLET | Freq: Every day | ORAL | Status: DC
  Administered 2017-09-27: 20 mg
  Filled 2017-09-27: qty 1

## 2017-09-27 MED ORDER — IPRATROPIUM-ALBUTEROL 0.5-2.5 (3) MG/3ML IN SOLN: 3.0000 mL | RESPIRATORY_TRACT | Status: DC | PRN

## 2017-09-27 MED ORDER — APIXABAN 2.5 MG PO TABS
2.5000 mg | ORAL_TABLET | Freq: Two times a day (BID) | ORAL | Status: DC
  Administered 2017-09-27 – 2017-09-28 (×2): 2.5 mg via ORAL
  Filled 2017-09-27 (×4): qty 1

## 2017-09-27 NOTE — Progress Notes (Signed)
PROGRESS NOTE    Kayla Kent  VZD:[id number] DOB: [birth year] DOA: 09/19/2017 PCP: Biagio Borg, MD   Brief Narrative:  Kayla Kent a 83 y.o.femalewith medical history significant for stage III chronic kidney disease, dyslipidemia, chronic atrial fibrillation previously on Eliquis, hypertension, seizure disorder on Keppra, and recent outpatient fine-needle aspiration of thyroid nodule.  Patient was initially admitted on 11/20 secondary to acute upper GI bleeding Heme Positive as well as multidrug-resistant Klebsiella pneumonia.   On 11/25 patient was aphasic with respiratory distress and was later found to have an acute MCA stroke.  Her eliquis had been held due to the GI bleed.  11/25: underwent thrombolysis of left middle cerebral artery She was treated for aspiration pneumonia as well and was intubated. She was eventually discharged to CIR on 11/30 On 12/3 she was evaluated for fever and suspected to have continued Aspiration pneumonia and readmitted to the hospital. On 12/5 she underwent MBS again and failed and will continue TF via Cortrack.  12/6 IR Consulted for PEG Placement. Awaiting to be done and discussed with Dr. Laurence Ferrari of IR and states it may not be done until early next week. Per IR note likely to be done on Tuesday 09/27/17.  12/8-12/9 overnight patient Pulled out Cortrak.  12/10  PEG tube Placement   Assessment & Plan:   Active Problems:   Anxiety   Essential hypertension   Atrial fibrillation with RVR (HCC)   Acute renal failure (HCC)   Gastroesophageal reflux disease   CKD (chronic kidney disease) stage 3, GFR 30-59 ml/min (HCC)   Chronic atrial fibrillation (HCC)   Dyslipidemia   COPD (chronic obstructive pulmonary disease) (HCC)   Acute hypoxemic respiratory failure (HCC)   Cerebral thrombosis with cerebral infarction   Aspiration pneumonia due to gastric secretions (HCC)   Acute ischemic left MCA stroke (HCC)   Acute respiratory failure with  hypoxia (HCC)   Dysphagia  Acute respiratory failure with hypoxia (HCC) 2/2 to Aspiration PNA - Leukocytosis resolved - Possible Aspiration Pneumonia again- low grade fever yesterday with possible Hypoxia- now is 100% on 2 L O2 - Apparently completed 5 days of antibiotics on 11/25 - On last admission: sputum and blood cultures were negative & resp virus panel was Negative - Respiratory Virus Panel Negative - WBC worsened to 17.4 as patient pulled out Cortrak but now improved  - Blood Cx x2 show NGTD at 5 Days - Now on Levaquin- will continue to follow; Will broaden Abx [place] and add Vancomycin; Will consider de-escalating Abx in AM  - CXR yesterday AM showed The patient's left upper extremity overlies the chest. Line for this, there is no suspected change in bilateral pneumonia most pronounced in the right upper lobe and left lower lobe. - Repeat CXR in AM   Recent CVA with Dysphagia - Will return to CIR when stable - Has been on IV Heparin at CIR and now changed to po Apixaban -Tube feeds through Cortrack discontinued for now as patient pulled out Cotrak  - Repeat MBS showed that patient did not pass  - Dietician evaluated and adjusted TF - IR evaluated 12/8 and PEG to be placed this AM; S/p PEG Tube by Dr. Kathlene Cote restarted TF - IVF with D5W at 50 mL/hr now Discontinued that TF restarted   A-Fib/ Flutter -Was not on rate controlling agent -Heparin D/C'd now and restart Eliquis when Appropriate through PEG Tube -Liquid Metoprolol 25 mg BID through Cortrak changed to IV yesterday; changed back once  ok to use PEG   HTN (hypertension) -Current blood pressure stable but having low diastolic's; BP now 659/93 -Given Gentle IVF bolus and started D5W at 50 mL/hr -Hold previous Clonidine for now -Can utilize hydralazine IV prn for Episodic Hypertension -Started Patient on Metoprolol 25 mg BID through PEG  CKD (chronic kidney disease), stage III  -Renal function stable and at  baseline -BUN/Cr was 11/0.63  Seizure Disorder -IV Keppra changed to po Through PEG  GI bleed? -EGD was normal on 11/23 -Restarted Home Apixaban  Thyroid nodule -Recent FNA of thyroid nodule with indeterminate pathology; testing ordered by PCP and patient can follow-up with this physician to discuss significance of findings  Dyslipidemia -Statin restarted through PEG  Leukocytosis, improved -WBC went from 17.4 -> 7.7 -Likely Reactive  -Patient on IV Levofloxacin for PNA; Broadened and add IV Vancomycin -Repeat CBC in AM  Thrombocytosis -Likely Reactive -Platelet Count went from 412 -> 448 -> 383 -> 402 -> 420 -> 406 -Continue to Monitor and Repeat CBC in AM   ? Nonsustained VTACH -Electrolytes wNL with K+ at 3.9, Mag at 1.8, and  -C/w Telemetry -Metoprolol 25 mg po BID through PEG  Severe Malnutrition in Context of Chronic Illness -Nutritionist evaluated  -TF Osmolite 1.2 @ 60 ml/hr (1440 ml daily) Providing, 1728 kcals, 80 g Pro, 1181 ml fluid + free water at 200 ml q 8 hrs through PEG Tube  Depression/Anxiety -Nursing stated she was not willing to participate with therapy -Patient seems more depressed 2/2 to Recent Events of CVA -Psych Consulted for formal evaluation  DVT prophylaxis: Resumed home Apixaban  Code Status: Partial CODE; OK with Intubation no Shocks or Compressions Family Communication: No family present at bedside Disposition Plan: CIR when Medically Stable for D/C  Consultants:   Interventional Radiology   Psychiatry   Procedures:  ECHOCARDIOGRAM Study Conclusions  - Left ventricle: The cavity size was normal. Wall thickness was   increased in a pattern of mild LVH. Systolic function was normal.   The estimated ejection fraction was in the range of 60% to 65%.   Wall motion was normal; there were no regional wall motion   abnormalities. - Mitral valve: There was mild regurgitation. - Left atrium: The atrium was severely dilated. -  Right atrium: The atrium was mildly dilated. - Pulmonary arteries: Systolic pressure was mildly increased. PA   peak pressure: 33 mm Hg (S).   Antimicrobials:  Anti-infectives (From admission, onward)   Start     Dose/Rate Route Frequency Ordered Stop   09/28/17 0800  vancomycin (VANCOCIN) IVPB 1000 mg/200 mL premix     1,000 mg 200 mL/hr over 60 Minutes Intravenous Every 24 hours 09/27/17 0825 09/30/17 0759   09/24/17 0800  vancomycin (VANCOCIN) IVPB 750 mg/150 ml premix  Status:  Discontinued     750 mg 150 mL/hr over 60 Minutes Intravenous Every 24 hours 09/23/17 0817 09/27/17 0825   09/23/17 0830  vancomycin (VANCOCIN) IVPB 750 mg/150 ml premix     750 mg 150 mL/hr over 60 Minutes Intravenous  Once 09/23/17 0807 09/23/17 1007   09/21/17 2200  levofloxacin (LEVAQUIN) IVPB 750 mg     750 mg 100 mL/hr over 90 Minutes Intravenous Every 48 hours 09/20/17 1030     09/20/17 0500  levofloxacin (LEVAQUIN) IVPB 500 mg  Status:  Discontinued     500 mg 100 mL/hr over 60 Minutes Intravenous Every 24 hours 09/19/17 1443 09/20/17 1030   09/19/17 1345  levofloxacin (LEVAQUIN) IVPB  750 mg  Status:  Discontinued     750 mg 100 mL/hr over 90 Minutes Intravenous Every 24 hours 09/19/17 1339 09/19/17 1444     Subjective: Seen and examined at beside had no complaints. Denied and Pain in Abdomen. Seemed down. Able to verbalize yes and no to questioning.   Objective: Vitals:   09/27/17 0752 09/27/17 1245 09/27/17 1300 09/27/17 1615  BP: (!) 152/50 (!) 144/45 (!) 141/54 (!) 134/57  Pulse: 83 81 84 91  Resp: _0 Temp: 97.6 F (36.4 C) 98.4 F (36.9 C)  98.8 F (37.1 C)  TempSrc: Oral Axillary  Axillary  SpO2: 99% 100% 100% 100%  Weight:      Height:        Intake/Output Summary (Last 24 hours) at 09/27/2017 1928 Last data filed at 09/27/2017 1700 Gross per 24 hour  Intake 1711.85 ml  Output 350 ml  Net 1361.85 ml   Filed Weights   09/25/17 0251 09/26/17 0300 09/27/17 0402    Weight: 45.1 kg (99 lb 6.8 oz) 42.2 kg (93 lb 0.6 oz) 43 kg (94 lb 12.8 oz)   Examination: Physical Exam:  Constitutional: Thin frail and cachetic appearing Caucasian female in NAD Eyes: Sclerae anicteric. Lids normal ENMT: External Ears appear normal. MMM Neck: Supple with no JVD Respiratory: Diminished to ausculation. Unlabored breathing. Has some rhonchi Cardiovascular: Irregularly Irregular. No appreciable LE Edema Abdomen: Soft, NT, ND. Bowel sounds present. PEG in place GU: Deferred Musculoskeletal: No contractures; No cyanosis Skin: Warm and dry; No rashes or lesions on a limited skin eval Neurologic: Remains aphasic and Right hand still has no grip strength. Right arm weakness Psychiatric: Awake and alert. Depressed appearing mood  Data Reviewed: I have personally reviewed following labs and imaging studies  CBC: Recent Labs  Lab 09/23/17 0445 09/24/17 0244 09/25/17 0407 09/26/17 0402 09/27/17 0632  WBC 16.9* 15.0* 17.4* 10.0 7.7  NEUTROABS 13.7* 12.4* 14.5* 7.5 5.3  HGB 9.8* 9.9* 9.7* 10.0* 9.1*  HCT 32.1* 32.0* 31.2* 31.9* 29.2*  MCV 98.5 96.4 96.0 96.4 98.0  PLT 383 402* 420* 406* 883   Basic Metabolic Panel: Recent Labs  Lab 09/23/17 0445 09/24/17 0244 09/25/17 0407 09/26/17 0402 09/27/17 0632  NA 137 137 138 137 138  K 4.3 4.5 4.7 4.1 3.9  CL 103 100* 102 104 107  CO2 _1 GLUCOSE 114* 113* 100* 84 90  BUN _2 CREATININE 0.63 0.64 0.67 0.62 0.63  CALCIUM 8.3* 8.3* 8.6* 8.4* 8.1*  MG 2.0 2.0 2.0 2.0 1.8  PHOS 3.7 4.5 4.8* 4.7* 4.1   GFR: Estimated Creatinine Clearance: 36.2 mL/min (by C-G formula based on SCr of 0.63 mg/dL). Liver Function Tests: Recent Labs  Lab 09/23/17 0445 09/24/17 0244 09/25/17 0407 09/26/17 0402 09/27/17 0632  AST 32 43* 35 25 19  ALT 39 49 46 39 27  ALKPHOS 91 86 78 70 61  BILITOT 0.5 0.3 0.5 0.6 0.4  PROT 5.5* 5.6* 5.3* 5.5* 4.8*  ALBUMIN 2.3* 2.2* 2.3* 2.2* 2.0*   No results for  input(s): LIPASE, AMYLASE in the last 168 hours. No results for input(s): AMMONIA in the last 168 hours. Coagulation Profile: No results for input(s): INR, PROTIME in the last 168 hours. Cardiac Enzymes: No results for input(s): CKTOTAL, CKMB, CKMBINDEX, TROPONINI in the last 168 hours. BNP (last 3 results) Recent Labs    10/22/16 1135  PROBNP 410.0*   HbA1C: No results  for input(s): HGBA1C in the last 72 hours. CBG: Recent Labs  Lab 09/27/17 0009 09/27/17 0345 09/27/17 0751 09/27/17 1243 09/27/17 1614  GLUCAP 78 86 89 75 148*   Lipid Profile: No results for input(s): CHOL, HDL, LDLCALC, TRIG, CHOLHDL, LDLDIRECT in the last 72 hours. Thyroid Function Tests: No results for input(s): TSH, T4TOTAL, FREET4, T3FREE, THYROIDAB in the last 72 hours. Anemia Panel: No results for input(s): VITAMINB12, FOLATE, FERRITIN, TIBC, IRON, RETICCTPCT in the last 72 hours. Sepsis Labs: No results for input(s): PROCALCITON, LATICACIDVEN in the last 168 hours.  Recent Results (from the past 240 hour(s))  Culture, blood (Routine X 2) w Reflex to ID Panel     Status: None   Collection Time: 09/19/17  7:35 AM  Result Value Ref Range Status   Specimen Description BLOOD LEFT HAND  Final   Special Requests IN PEDIATRIC BOTTLE Blood Culture adequate volume  Final   Culture NO GROWTH 5 DAYS  Final   Report Status 09/24/2017 FINAL  Final  Culture, blood (Routine X 2) w Reflex to ID Panel     Status: None   Collection Time: 09/19/17  7:40 AM  Result Value Ref Range Status   Specimen Description BLOOD LEFT HAND  Final   Special Requests IN PEDIATRIC BOTTLE Blood Culture adequate volume  Final   Culture NO GROWTH 5 DAYS  Final   Report Status 09/24/2017 FINAL  Final  Respiratory Panel by PCR     Status: None   Collection Time: 09/19/17  2:17 PM  Result Value Ref Range Status   Adenovirus NOT DETECTED NOT DETECTED Final   Coronavirus 229E NOT DETECTED NOT DETECTED Final   Coronavirus HKU1 NOT  DETECTED NOT DETECTED Final   Coronavirus NL63 NOT DETECTED NOT DETECTED Final   Coronavirus OC43 NOT DETECTED NOT DETECTED Final   Metapneumovirus NOT DETECTED NOT DETECTED Final   Rhinovirus / Enterovirus NOT DETECTED NOT DETECTED Final   Influenza A NOT DETECTED NOT DETECTED Final   Influenza B NOT DETECTED NOT DETECTED Final   Parainfluenza Virus 1 NOT DETECTED NOT DETECTED Final   Parainfluenza Virus 2 NOT DETECTED NOT DETECTED Final   Parainfluenza Virus 3 NOT DETECTED NOT DETECTED Final   Parainfluenza Virus 4 NOT DETECTED NOT DETECTED Final   Respiratory Syncytial Virus NOT DETECTED NOT DETECTED Final   Bordetella pertussis NOT DETECTED NOT DETECTED Final   Chlamydophila pneumoniae NOT DETECTED NOT DETECTED Final   Mycoplasma pneumoniae NOT DETECTED NOT DETECTED Final  Culture, respiratory (NON-Expectorated)     Status: None   Collection Time: 09/19/17  3:43 PM  Result Value Ref Range Status   Specimen Description TRACHEAL ASPIRATE  Final   Special Requests NONE  Final   Gram Stain   Final    RARE WBC PRESENT, PREDOMINANTLY PMN RARE YEAST RARE GRAM POSITIVE COCCI IN PAIRS RARE SQUAMOUS EPITHELIAL CELLS PRESENT    Culture Consistent with normal respiratory flora.  Final   Report Status 09/26/2017 FINAL  Final  Culture, Urine     Status: None   Collection Time: 09/19/17  5:21 PM  Result Value Ref Range Status   Specimen Description URINE, RANDOM  Final   Special Requests NONE  Final   Culture NO GROWTH  Final   Report Status 09/20/2017 FINAL  Final    Radiology Studies: Ir Gastrostomy Tube Mod Sed  Result Date: 09/26/2017 CLINICAL DATA:  Cerebral infarction and need for percutaneous gastrostomy tube for nutrition. EXAM: PERCUTANEOUS GASTROSTOMY TUBE PLACEMENT ANESTHESIA/SEDATION: 1.0  mg IV Versed; 50 mcg IV Fentanyl. Total Moderate Sedation Time 10 minutes. The patient's level of consciousness and physiologic status were continuously monitored during the procedure by  Radiology nursing. CONTRAST:  5 mL Isovue 300 contrast injected into the lumen of the stomach. MEDICATIONS: No additional medications. The patient is currently receiving IV vancomycin daily and additional prophylactic antibiotic was not given for the procedure. FLUOROSCOPY TIME:  4 minutes and 30 seconds.  3.3 mGy. PROCEDURE: The procedure, risks, benefits, and alternatives were explained to the patient's daughter. Questions regarding the procedure were encouraged and answered. The patient's daughter understands and consents to the procedure. A 5-French catheter was then advanced through the the patient's mouth under fluoroscopy into the esophagus and to the level of the stomach. This catheter was used to insufflate the stomach with air under fluoroscopy. The abdominal wall was prepped with chlorhexidine in a sterile fashion, and a sterile drape was applied covering the operative field. A sterile gown and sterile gloves were used for the procedure. Local anesthesia was provided with 1% Lidocaine. A skin incision was made in the upper abdominal wall. Under fluoroscopy, an 18 gauge trocar needle was advanced into the stomach. Contrast injection was performed to confirm intraluminal position of the needle tip. A single T tack was then deployed in the lumen of the stomach. This was brought up to tension at the skin surface. Over a guidewire, a 9-French sheath was advanced into the lumen of the stomach. The wire was left in place as a safety wire. A loop snare device from a percutaneous gastrostomy kit was then advanced into the stomach. A floppy guide wire was advanced through the orogastric catheter under fluoroscopy in the stomach. The loop snare advanced through the percutaneous gastric access was used to snare the guide wire. This allowed withdrawal of the loop snare out of the patient's mouth by retraction of the orogastric catheter and wire. A 20-French bumper retention gastrostomy tube was looped around the snare  device. It was then pulled back through the patient's mouth. The retention bumper was brought up to the anterior gastric wall. The T tack suture was cut at the skin. The exiting gastrostomy tube was cut to appropriate length and a feeding adapter applied. The catheter was injected with contrast material to confirm position and a fluoroscopic spot image saved. The tube was then flushed with saline. A dressing was applied over the gastrostomy exit site. COMPLICATIONS: None. FINDINGS: The stomach distended well with air allowing safe placement of the gastrostomy tube. After placement, the tip of the gastrostomy tube lies in the body of the stomach. IMPRESSION: Percutaneous gastrostomy with placement of a 20-French bumper retention tube in the body of the stomach. This tube can be used beginning in 24 hours after placement. Electronically Signed   By: Aletta Edouard M.D.   On: 09/26/2017 12:10   Scheduled Meds: . apixaban  2.5 mg Oral BID  . [START ON 09/28/2017] aspirin  325 mg Per Tube Daily  . atorvastatin  20 mg Per Tube q1800  . budesonide (PULMICORT) nebulizer solution  0.25 mg Nebulization BID  . chlorhexidine  15 mL Mouth Rinse BID  . free water  200 mL Per Tube Q8H  . levETIRAcetam  250 mg Per Tube BID  . mouth rinse  15 mL Mouth Rinse BID  . metoprolol tartrate  25 mg Per Tube BID  . pantoprazole sodium  40 mg Per Tube BID   Continuous Infusions: . dextrose 5 % and  0.9% NaCl 50 mL/hr at 09/26/17 2316  . feeding supplement (OSMOLITE 1.2 CAL) 1,000 mL (09/27/17 1349)  . levofloxacin (LEVAQUIN) IV Stopped (09/25/17 2312)  . [START ON 09/28/2017] vancomycin      LOS: 8 days   Kerney Elbe, DO [facility] Pager [phone]  If 7PM-7AM, please contact night-coverage [email or web address] Password [facility] 09/27/2017, 7:28 PM

## 2017-09-27 NOTE — Progress Notes (Signed)
Pharmacy Antibiotic Note Kayla Kent is a 83 y.o. female admitted on 09/19/2017. Currently on day 8 of Levaquin and day 5 of vancomycin.   Vancomycin trough of 11 this am is below goal of 15-20 based on treatment of PNA. WBC improved and remains afebrile. SCr stable   Plan: 1. Adjust vancomycin to 1000 mg every 24 hours starting on 12/12 and no change in original stop date of 12/14 (7 days of therapy)  2. Consider stopping Levaquin as on day 8 of therapy   Height: 5\' 7"  (170.2 cm) Weight: 94 lb 12.8 oz (43 kg) IBW/kg (Calculated) : 61.6  Temp (24hrs), Avg:97.4 F (36.3 C), Min:96.6 F (35.9 C), Max:97.9 F (36.6 C)  Recent Labs  Lab 09/23/17 0445 09/24/17 0244 09/25/17 0407 09/26/17 0402 09/27/17 0632  WBC 16.9* 15.0* 17.4* 10.0 7.7  CREATININE 0.63 0.64 0.67 0.62 0.63  VANCOTROUGH  --   --   --   --  11*    Estimated Creatinine Clearance: 36.2 mL/min (by C-G formula based on SCr of 0.63 mg/dL).    Allergies  Allergen Reactions  . Chocolate Hives  . Codeine Other (See Comments)    Patient states she acts crazy  . Diazepam Other (See Comments)    Patient states she acts crazy.  . Fruit & Vegetable Daily [Nutritional Supplements] Hives and Swelling    peaches  . Iohexol Hives     Code: HIVES, Desc: pt gets 13 hr pre-meds, Onset Date: [id number]   . Latex Hives  . Peach [Prunus Persica] Hives  . Peanut-Containing Drug Products Hives and Swelling  . Penicillins Hives, Itching and Swelling    Has patient had a PCN reaction causing immediate rash, facial/tongue/throat swelling, SOB or lightheadedness with hypotension: Yes Has patient had a PCN reaction causing severe rash involving mucus membranes or skin necrosis: No Has patient had a PCN reaction that required hospitalization No Has patient had a PCN reaction occurring within the last 10 years: No If all of the above answers are "NO", then may proceed with Cephalosporin use.   . Strawberry Extract Hives  .  Sulfonamide Derivatives Hives    nausea  . Wheat Shortness Of Breath    Shortness of breath  . Wheat Bran Shortness Of Breath  . Sulfamethoxazole Hives  . Crestor [Rosuvastatin Calcium] Rash  . Iodine Rash    Antimicrobials this admission: Cipro 11/20>>11/21 Meropenem 11/21>>11/28  Vancomycin 11/21>>11/25; 12/7 >>  Levaquin 12/3 >>  Zyvox 11/25>>11/28  Microbiology results: 11/20 Blood x 2: negF 11/20 Urine: >100K Kleb pneumo - ESBL, sensitive to cipro, imi, gent  11/20 MRSA PCR: neg 11/22 Strep pneumo Ag: negative 11/25 BC x 2: negF  11/25 urine: negF 12/3 bcx: ngF 12/3 resp cx: resp flora  12/3 ucx: ngF    Thank you for allowing pharmacy to be a part of this patient's care.  Vincenza Hews, PharmD, BCPS 09/27/2017, 8:26 AM

## 2017-09-27 NOTE — Progress Notes (Signed)
Referring Physician(s): Dr. Alfredia Ferguson  Supervising Physician: Corrie Mckusick  Patient Status:  [facility] - In-pt  Chief Complaint: Dysphagia  Subjective: Patient arouses easily.  Nods head in appropriate response to questions.  Denies pain.  Allergies: Chocolate; Codeine; Diazepam; Fruit & vegetable daily [nutritional supplements]; Iohexol; Latex; Peach [prunus persica]; Peanut-containing drug products; Penicillins; Strawberry extract; Sulfonamide derivatives; Wheat; Wheat bran; Sulfamethoxazole; Crestor [rosuvastatin calcium]; and Iodine  Medications: Prior to Admission medications   Medication Sig Start Date End Date Taking? Authorizing Provider  acetaminophen (TYLENOL) 325 MG tablet Take 2 tablets (650 mg total) by mouth every 4 (four) hours as needed for mild pain (or temp > 37.5 C (99.5 F)). 09/16/17  Yes Erick Colace, NP  aspirin 300 MG suppository Place 1 suppository (300 mg total) rectally daily. 09/17/17  Yes Erick Colace, NP  atorvastatin (LIPITOR) 20 MG tablet Take 1 tablet (20 mg total) by mouth daily at 6 PM. 09/16/17  Yes Erick Colace, NP  bisacodyl (DULCOLAX) 10 MG suppository Place 1 suppository (10 mg total) rectally daily as needed for moderate constipation. 09/16/17  Yes Erick Colace, NP  chlorhexidine gluconate, MEDLINE KIT, (PERIDEX) 0.12 % solution 15 mLs by Mouth Rinse route 2 (two) times daily. 09/16/17  Yes Erick Colace, NP  heparin 100-0.45 UNIT/ML-% infusion Inject 900 Units/hr into the vein continuous. 09/16/17  Yes Erick Colace, NP  ipratropium-albuterol (DUONEB) 0.5-2.5 (3) MG/3ML SOLN Take 3 mLs by nebulization every 4 (four) hours as needed. 09/16/17  Yes Erick Colace, NP  levETIRAcetam (KEPPRA) 100 MG/ML solution Take 2.5 mLs (250 mg total) by mouth 2 (two) times daily. 09/16/17  Yes Erick Colace, NP  Nutritional Supplements (FEEDING SUPPLEMENT, OSMOLITE 1.2 CAL,) LIQD Place 1,000 mLs into feeding tube continuous. 09/16/17  Yes  Erick Colace, NP  pantoprazole sodium (PROTONIX) 40 mg/20 mL PACK Place 20 mLs (40 mg total) into feeding tube daily at 12 noon. 09/17/17  Yes Erick Colace, NP  senna-docusate (SENOKOT-S) 8.6-50 MG tablet 1 tablet by Per NG tube route at bedtime. 09/16/17  Yes Erick Colace, NP  sodium chloride (OCEAN) 0.65 % SOLN nasal spray Place 1 spray into both nostrils as needed for congestion. 09/16/17  Yes Erick Colace, NP  sodium chloride 0.9 % infusion Inject 250 mLs into the vein as needed (if IV carrier fluid needed.). 09/16/17  Yes Erick Colace, NP     Vital Signs: BP (!) 144/45 (BP Location: Left Arm)   Pulse 81   Temp 98.4 F (36.9 C) (Axillary)   Resp 13   Ht _0  (1.702 m)   Wt 94 lb 12.8 oz (43 kg)   SpO2 100%   BMI 14.85 kg/m   Physical Exam  NAD, alert Abd:  Soft, non-tender. Gastrostomy in place without issue.  Insertion site c/d/i.    Imaging: Ir Gastrostomy Tube Mod Sed  Result Date: 09/26/2017 CLINICAL DATA:  Cerebral infarction and need for percutaneous gastrostomy tube for nutrition. EXAM: PERCUTANEOUS GASTROSTOMY TUBE PLACEMENT ANESTHESIA/SEDATION: 1.0 mg IV Versed; 50 mcg IV Fentanyl. Total Moderate Sedation Time 10 minutes. The patient's level of consciousness and physiologic status were continuously monitored during the procedure by Radiology nursing. CONTRAST:  5 mL Isovue 300 contrast injected into the lumen of the stomach. MEDICATIONS: No additional medications. The patient is currently receiving IV vancomycin daily and additional prophylactic antibiotic was not given for the procedure. FLUOROSCOPY TIME:  4 minutes and 30 seconds.  3.3  mGy. PROCEDURE: The procedure, risks, benefits, and alternatives were explained to the patient's daughter. Questions regarding the procedure were encouraged and answered. The patient's daughter understands and consents to the procedure. A 5-French catheter was then advanced through the the patient's mouth under  fluoroscopy into the esophagus and to the level of the stomach. This catheter was used to insufflate the stomach with air under fluoroscopy. The abdominal wall was prepped with chlorhexidine in a sterile fashion, and a sterile drape was applied covering the operative field. A sterile gown and sterile gloves were used for the procedure. Local anesthesia was provided with 1% Lidocaine. A skin incision was made in the upper abdominal wall. Under fluoroscopy, an 18 gauge trocar needle was advanced into the stomach. Contrast injection was performed to confirm intraluminal position of the needle tip. A single T tack was then deployed in the lumen of the stomach. This was brought up to tension at the skin surface. Over a guidewire, a 9-French sheath was advanced into the lumen of the stomach. The wire was left in place as a safety wire. A loop snare device from a percutaneous gastrostomy kit was then advanced into the stomach. A floppy guide wire was advanced through the orogastric catheter under fluoroscopy in the stomach. The loop snare advanced through the percutaneous gastric access was used to snare the guide wire. This allowed withdrawal of the loop snare out of the patient's mouth by retraction of the orogastric catheter and wire. A 20-French bumper retention gastrostomy tube was looped around the snare device. It was then pulled back through the patient's mouth. The retention bumper was brought up to the anterior gastric wall. The T tack suture was cut at the skin. The exiting gastrostomy tube was cut to appropriate length and a feeding adapter applied. The catheter was injected with contrast material to confirm position and a fluoroscopic spot image saved. The tube was then flushed with saline. A dressing was applied over the gastrostomy exit site. COMPLICATIONS: None. FINDINGS: The stomach distended well with air allowing safe placement of the gastrostomy tube. After placement, the tip of the gastrostomy tube  lies in the body of the stomach. IMPRESSION: Percutaneous gastrostomy with placement of a 20-French bumper retention tube in the body of the stomach. This tube can be used beginning in 24 hours after placement. Electronically Signed   By: Aletta Edouard M.D.   On: 09/26/2017 12:10   Dg Chest Port 1 View  Result Date: 09/25/2017 CLINICAL DATA:  Followup pneumonia EXAM: PORTABLE CHEST 1 VIEW COMPARISON:  09/24/2017 FINDINGS: Artifact overlies chest. The patient's left upper extremity overlies the chest. Line for this, there is no suspected change in bilateral pneumonia most pronounced in the right upper lobe and left lower lobe. IMPRESSION: Overlying artifact.  No change in bilateral pneumonia suspected. Electronically Signed   By: Nelson Chimes M.D.   On: 09/25/2017 07:52   Dg Chest Port 1 View  Result Date: 09/24/2017 CLINICAL DATA:  Shortness of breath; hx of HTN and emphysema EXAM: PORTABLE CHEST 1 VIEW COMPARISON:  09/23/2017 FINDINGS: Right upper lobe confluent airspace lung opacity is similar to the previous day's study. Hazy type right lower lung opacity is also stable. Lungs are hyperexpanded. There is mild interstitial thickening most evident in the bases, and scarring/atelectasis in the right lateral lung base. Interstitial thickening and central vascular congestion appears improved from the previous day's study. No new lung abnormalities. No definite pleural effusion.  No pneumothorax. Enteric feeding tube passes below  the diaphragm, unchanged. IMPRESSION: 1. Mild improvement in overall lung aeration with a decrease in central vascular congestion and interstitial thickening. This suggests improved pulmonary edema. 2. Persistent right upper lobe consolidation, which suggests pneumonia. 3. No new abnormalities. 4. COPD. Electronically Signed   By: Lajean Manes M.D.   On: 09/24/2017 09:18    Labs:  CBC: Recent Labs    09/24/17 0244 09/25/17 0407 09/26/17 0402 09/27/17 0632  WBC 15.0*  17.4* 10.0 7.7  HGB 9.9* 9.7* 10.0* 9.1*  HCT 32.0* 31.2* 31.9* 29.2*  PLT 402* 420* 406* 366    COAGS: Recent Labs    04/06/17 1906 04/06/17 2212 09/11/17 2010 09/13/17 0334  INR 1.51 1.59 1.22 1.17  APTT 34 38* 36  --     BMP: Recent Labs    09/24/17 0244 09/25/17 0407 09/26/17 0402 09/27/17 0632  NA 137 138 137 138  K 4.5 4.7 4.1 3.9  CL 100* 102 104 107  CO2 _0 GLUCOSE 113* 100* 84 90  BUN _1 CALCIUM 8.3* 8.6* 8.4* 8.1*  CREATININE 0.64 0.67 0.62 0.63  GFRNONAA >60 >60 >60 >60  GFRAA >60 >60 >60 >60    LIVER FUNCTION TESTS: Recent Labs    09/24/17 0244 09/25/17 0407 09/26/17 0402 09/27/17 0632  BILITOT 0.3 0.5 0.6 0.4  AST 43* 35 25 19  ALT 49 46 39 27  ALKPHOS 86 78 70 61  PROT 5.6* 5.3* 5.5* 4.8*  ALBUMIN 2.2* 2.3* 2.2* 2.0*    Assessment and Plan: Dysphagia s/p percutaneous gastrostomy placement Patient had gastrostomy placed yesterday.  Denies belly pain.  Insertion site intact and clean.  [place] for use.  RN aware.  IR available if needed.   Electronically Signed: Docia Barrier, PA 09/27/2017, 12:56 PM   I spent a total of 15 Minutes at the the patient's bedside AND on the patient's hospital floor or unit, greater than 50% of which was counseling/coordinating care for dysphagia.

## 2017-09-27 NOTE — Progress Notes (Signed)
Rehab admissions - I spoke with patient's daughter by phone today.  Patient now has a PEG, but patient and family are hopeful that PEG is a temporary feeding measure.  Daughter says that she would like patient to return to acute inpatient rehab as soon as patient is medically ready.  Will await medical readiness and rehab bed availability. Call me for questions.  #[phone]

## 2017-09-27 NOTE — Progress Notes (Signed)
Kayla Kent for Heparin Indication: Afib and CVA  Patient Measurements: Height: 5\' 7"  (170.2 cm) Weight: 94 lb 12.8 oz (43 kg) IBW/kg (Calculated) : 61.6   Vital Signs: Temp: 97.6 F (36.4 C) (12/11 0752) Temp Source: Oral (12/11 0752) BP: 152/50 (12/11 0752) Pulse Rate: 83 (12/11 0752)  Assessment: 56 YOF who continues on heparin for hx afib, new stroke 11/25 (PTA Eliquis) Had GIB on admit 11/20. Heparin resumed on 11/28 with lower heparin goal of 0.3-0.5units/mL.   Heparin level remains therapeutic this am after heparin resumed post PEG tube placement on 12/10. CBC relatively stable with no reported bleeding.   Goal of Therapy:  Heparin level 0.3-0.5 units/ml Monitor platelets by anticoagulation protocol: Yes   Plan:  -Continue heparin infusion at 850 units/hr -Daily heparin level for now -Noted plans to transition to apixaban once peg tube ok to use   Vincenza Hews, PharmD, BCPS 09/27/2017, 8:31 AM

## 2017-09-27 NOTE — Care Management Important Message (Signed)
Important Message  Patient Details  Name: NIVEDITA MIRABELLA MRN: [id number] Date of Birth: Jan 11, 1932   Medicare Important Message Given:  Yes    Zenon Mayo, RN 09/27/2017, 3:48 PM

## 2017-09-27 NOTE — Progress Notes (Signed)
  Speech Language Pathology Treatment: Dysphagia;Cognitive-Linquistic  Patient Details Name: Kayla Kent MRN: [id number] DOB: 20-Oct-1931 Today's Date: 09/27/2017 Time: 6060-0459 SLP Time Calculation (min) (ACUTE ONLY): 14 min  Assessment / Plan / Recommendation Clinical Impression  Pt was more communicative today, making verbal requests with Mod A from SLP secondary to dysarthria. Her voice sounds less wet at baseline today, but it becomes wet and she has delayed throat clearing/coughing after PO trials are introduced. Mod-Max cues were provided for increased effort for cough and swallow initiation. Mod A was also provided for management of R-sided anterior spillage. Recommend to continue NPO status. She would benefit from intensive SLP f/u to maximize return of function.   HPI HPI: Pt is an 83 y.o.femalewith medical history significant for stage III chronic kidney disease, dyslipidemia, chronic atrial fibrillation previously on Eliquis, hypertension, seizure disorder on Keppra, and recent outpatient fine-needle aspiration of thyroid nodule. 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 She was intubated 11/25-11/27. MBS completed 11/28 showed a mdoerate-severe oropharyngeal dysphagia with delays in swallow initiation adn pharyngeal weakness. She was recommended to remain NPO  with therapeutic trials of puree. Pt went to CIR on 11/30 where she continued to work on swallow initiation but was readmitted to the hospital on 12/3 with suspected PNA.      SLP Plan  Continue with current plan of care       Recommendations  Diet recommendations: NPO Medication Administration: Via alternative means                Oral Care Recommendations: Oral care QID Follow up Recommendations: Inpatient Rehab SLP Visit Diagnosis: Dysphagia, oropharyngeal phase (R13.12);Dysarthria and anarthria  (R47.1) Plan: Continue with current plan of care       GO                Kayla Kent 09/27/2017, 10:03 AM  Kayla Kent, M.A. CCC-SLP [phone]

## 2017-09-28 ENCOUNTER — Other Ambulatory Visit: Payer: Self-pay

## 2017-09-28 ENCOUNTER — Inpatient Hospital Stay (HOSPITAL_COMMUNITY): Payer: Medicare Other

## 2017-09-28 ENCOUNTER — Inpatient Hospital Stay (HOSPITAL_COMMUNITY)
Admission: RE | Admit: 2017-09-28 | Discharge: 2017-10-24 | DRG: 056 | Disposition: A | Discharge status: Skilled Nursing Facility | Payer: Medicare Other | Source: Intra-hospital | Attending: Physical Medicine & Rehabilitation | Admitting: Physical Medicine & Rehabilitation

## 2017-09-28 ENCOUNTER — Encounter (HOSPITAL_COMMUNITY): Payer: Self-pay | Admitting: *Deleted

## 2017-09-28 ENCOUNTER — Telehealth: Payer: Self-pay | Admitting: Internal Medicine

## 2017-09-28 DIAGNOSIS — I69391 Dysphagia following cerebral infarction: Secondary | ICD-10-CM

## 2017-09-28 DIAGNOSIS — I739 Peripheral vascular disease, unspecified: Secondary | ICD-10-CM | POA: Diagnosis present

## 2017-09-28 DIAGNOSIS — I4891 Unspecified atrial fibrillation: Secondary | ICD-10-CM

## 2017-09-28 DIAGNOSIS — Z5189 Encounter for other specified aftercare: Secondary | ICD-10-CM | POA: Diagnosis not present

## 2017-09-28 DIAGNOSIS — I482 Chronic atrial fibrillation: Secondary | ICD-10-CM | POA: Diagnosis not present

## 2017-09-28 DIAGNOSIS — Z91040 Latex allergy status: Secondary | ICD-10-CM

## 2017-09-28 DIAGNOSIS — E440 Moderate protein-calorie malnutrition: Secondary | ICD-10-CM

## 2017-09-28 DIAGNOSIS — Z91010 Allergy to peanuts: Secondary | ICD-10-CM

## 2017-09-28 DIAGNOSIS — Z515 Encounter for palliative care: Secondary | ICD-10-CM | POA: Diagnosis not present

## 2017-09-28 DIAGNOSIS — I480 Paroxysmal atrial fibrillation: Secondary | ICD-10-CM

## 2017-09-28 DIAGNOSIS — I69320 Aphasia following cerebral infarction: Secondary | ICD-10-CM

## 2017-09-28 DIAGNOSIS — Z8249 Family history of ischemic heart disease and other diseases of the circulatory system: Secondary | ICD-10-CM

## 2017-09-28 DIAGNOSIS — G4700 Insomnia, unspecified: Secondary | ICD-10-CM | POA: Diagnosis present

## 2017-09-28 DIAGNOSIS — I63512 Cerebral infarction due to unspecified occlusion or stenosis of left middle cerebral artery: Secondary | ICD-10-CM

## 2017-09-28 DIAGNOSIS — E43 Unspecified severe protein-calorie malnutrition: Secondary | ICD-10-CM | POA: Diagnosis present

## 2017-09-28 DIAGNOSIS — I69351 Hemiplegia and hemiparesis following cerebral infarction affecting right dominant side: Principal | ICD-10-CM

## 2017-09-28 DIAGNOSIS — Z87891 Personal history of nicotine dependence: Secondary | ICD-10-CM

## 2017-09-28 DIAGNOSIS — R1312 Dysphagia, oropharyngeal phase: Secondary | ICD-10-CM | POA: Diagnosis not present

## 2017-09-28 DIAGNOSIS — R030 Elevated blood-pressure reading, without diagnosis of hypertension: Secondary | ICD-10-CM | POA: Diagnosis not present

## 2017-09-28 DIAGNOSIS — J309 Allergic rhinitis, unspecified: Secondary | ICD-10-CM | POA: Diagnosis present

## 2017-09-28 DIAGNOSIS — K922 Gastrointestinal hemorrhage, unspecified: Secondary | ICD-10-CM

## 2017-09-28 DIAGNOSIS — Z91018 Allergy to other foods: Secondary | ICD-10-CM

## 2017-09-28 DIAGNOSIS — J690 Pneumonitis due to inhalation of food and vomit: Secondary | ICD-10-CM

## 2017-09-28 DIAGNOSIS — N183 Chronic kidney disease, stage 3 unspecified: Secondary | ICD-10-CM

## 2017-09-28 DIAGNOSIS — F4320 Adjustment disorder, unspecified: Secondary | ICD-10-CM | POA: Diagnosis not present

## 2017-09-28 DIAGNOSIS — Z7982 Long term (current) use of aspirin: Secondary | ICD-10-CM

## 2017-09-28 DIAGNOSIS — Z91041 Radiographic dye allergy status: Secondary | ICD-10-CM

## 2017-09-28 DIAGNOSIS — E785 Hyperlipidemia, unspecified: Secondary | ICD-10-CM | POA: Diagnosis present

## 2017-09-28 DIAGNOSIS — R350 Frequency of micturition: Secondary | ICD-10-CM | POA: Diagnosis not present

## 2017-09-28 DIAGNOSIS — Z885 Allergy status to narcotic agent status: Secondary | ICD-10-CM

## 2017-09-28 DIAGNOSIS — N814 Uterovaginal prolapse, unspecified: Secondary | ICD-10-CM | POA: Diagnosis present

## 2017-09-28 DIAGNOSIS — R41841 Cognitive communication deficit: Secondary | ICD-10-CM | POA: Diagnosis not present

## 2017-09-28 DIAGNOSIS — J439 Emphysema, unspecified: Secondary | ICD-10-CM | POA: Diagnosis not present

## 2017-09-28 DIAGNOSIS — E871 Hypo-osmolality and hyponatremia: Secondary | ICD-10-CM | POA: Diagnosis not present

## 2017-09-28 DIAGNOSIS — N390 Urinary tract infection, site not specified: Secondary | ICD-10-CM | POA: Diagnosis not present

## 2017-09-28 DIAGNOSIS — R410 Disorientation, unspecified: Secondary | ICD-10-CM | POA: Diagnosis not present

## 2017-09-28 DIAGNOSIS — D75839 Thrombocytosis, unspecified: Secondary | ICD-10-CM

## 2017-09-28 DIAGNOSIS — R194 Change in bowel habit: Secondary | ICD-10-CM

## 2017-09-28 DIAGNOSIS — L89152 Pressure ulcer of sacral region, stage 2: Secondary | ICD-10-CM | POA: Diagnosis present

## 2017-09-28 DIAGNOSIS — Z923 Personal history of irradiation: Secondary | ICD-10-CM

## 2017-09-28 DIAGNOSIS — F419 Anxiety disorder, unspecified: Secondary | ICD-10-CM | POA: Diagnosis present

## 2017-09-28 DIAGNOSIS — C3411 Malignant neoplasm of upper lobe, right bronchus or lung: Secondary | ICD-10-CM | POA: Diagnosis present

## 2017-09-28 DIAGNOSIS — J449 Chronic obstructive pulmonary disease, unspecified: Secondary | ICD-10-CM | POA: Diagnosis present

## 2017-09-28 DIAGNOSIS — R4182 Altered mental status, unspecified: Secondary | ICD-10-CM | POA: Diagnosis not present

## 2017-09-28 DIAGNOSIS — R9389 Abnormal findings on diagnostic imaging of other specified body structures: Secondary | ICD-10-CM | POA: Diagnosis not present

## 2017-09-28 DIAGNOSIS — I63312 Cerebral infarction due to thrombosis of left middle cerebral artery: Secondary | ICD-10-CM | POA: Diagnosis not present

## 2017-09-28 DIAGNOSIS — Z681 Body mass index (BMI) 19 or less, adult: Secondary | ICD-10-CM | POA: Diagnosis not present

## 2017-09-28 DIAGNOSIS — I69392 Facial weakness following cerebral infarction: Secondary | ICD-10-CM | POA: Diagnosis not present

## 2017-09-28 DIAGNOSIS — E876 Hypokalemia: Secondary | ICD-10-CM | POA: Diagnosis present

## 2017-09-28 DIAGNOSIS — R05 Cough: Secondary | ICD-10-CM | POA: Diagnosis not present

## 2017-09-28 DIAGNOSIS — Z888 Allergy status to other drugs, medicaments and biological substances status: Secondary | ICD-10-CM

## 2017-09-28 DIAGNOSIS — D72829 Elevated white blood cell count, unspecified: Secondary | ICD-10-CM | POA: Diagnosis not present

## 2017-09-28 DIAGNOSIS — I6330 Cerebral infarction due to thrombosis of unspecified cerebral artery: Secondary | ICD-10-CM | POA: Diagnosis present

## 2017-09-28 DIAGNOSIS — Z9189 Other specified personal risk factors, not elsewhere classified: Secondary | ICD-10-CM

## 2017-09-28 DIAGNOSIS — J181 Lobar pneumonia, unspecified organism: Secondary | ICD-10-CM | POA: Diagnosis not present

## 2017-09-28 DIAGNOSIS — K5901 Slow transit constipation: Secondary | ICD-10-CM | POA: Diagnosis not present

## 2017-09-28 DIAGNOSIS — R739 Hyperglycemia, unspecified: Secondary | ICD-10-CM

## 2017-09-28 DIAGNOSIS — I130 Hypertensive heart and chronic kidney disease with heart failure and stage 1 through stage 4 chronic kidney disease, or unspecified chronic kidney disease: Secondary | ICD-10-CM | POA: Diagnosis present

## 2017-09-28 DIAGNOSIS — Z79899 Other long term (current) drug therapy: Secondary | ICD-10-CM

## 2017-09-28 DIAGNOSIS — G464 Cerebellar stroke syndrome: Secondary | ICD-10-CM | POA: Diagnosis not present

## 2017-09-28 DIAGNOSIS — K5900 Constipation, unspecified: Secondary | ICD-10-CM

## 2017-09-28 DIAGNOSIS — G40909 Epilepsy, unspecified, not intractable, without status epilepticus: Secondary | ICD-10-CM | POA: Diagnosis present

## 2017-09-28 DIAGNOSIS — F329 Major depressive disorder, single episode, unspecified: Secondary | ICD-10-CM

## 2017-09-28 DIAGNOSIS — Z880 Allergy status to penicillin: Secondary | ICD-10-CM

## 2017-09-28 DIAGNOSIS — Z882 Allergy status to sulfonamides status: Secondary | ICD-10-CM

## 2017-09-28 DIAGNOSIS — R918 Other nonspecific abnormal finding of lung field: Secondary | ICD-10-CM | POA: Diagnosis not present

## 2017-09-28 DIAGNOSIS — R1110 Vomiting, unspecified: Secondary | ICD-10-CM | POA: Diagnosis not present

## 2017-09-28 DIAGNOSIS — R1310 Dysphagia, unspecified: Secondary | ICD-10-CM | POA: Diagnosis present

## 2017-09-28 DIAGNOSIS — R32 Unspecified urinary incontinence: Secondary | ICD-10-CM | POA: Diagnosis present

## 2017-09-28 DIAGNOSIS — I69922 Dysarthria following unspecified cerebrovascular disease: Secondary | ICD-10-CM | POA: Diagnosis not present

## 2017-09-28 DIAGNOSIS — Z9049 Acquired absence of other specified parts of digestive tract: Secondary | ICD-10-CM

## 2017-09-28 DIAGNOSIS — Z8719 Personal history of other diseases of the digestive system: Secondary | ICD-10-CM

## 2017-09-28 DIAGNOSIS — R4189 Other symptoms and signs involving cognitive functions and awareness: Secondary | ICD-10-CM | POA: Diagnosis present

## 2017-09-28 DIAGNOSIS — R279 Unspecified lack of coordination: Secondary | ICD-10-CM | POA: Diagnosis not present

## 2017-09-28 DIAGNOSIS — I6350 Cerebral infarction due to unspecified occlusion or stenosis of unspecified cerebral artery: Secondary | ICD-10-CM | POA: Diagnosis not present

## 2017-09-28 DIAGNOSIS — R338 Other retention of urine: Secondary | ICD-10-CM | POA: Diagnosis not present

## 2017-09-28 DIAGNOSIS — R569 Unspecified convulsions: Secondary | ICD-10-CM

## 2017-09-28 DIAGNOSIS — K219 Gastro-esophageal reflux disease without esophagitis: Secondary | ICD-10-CM | POA: Diagnosis not present

## 2017-09-28 DIAGNOSIS — Z09 Encounter for follow-up examination after completed treatment for conditions other than malignant neoplasm: Secondary | ICD-10-CM

## 2017-09-28 DIAGNOSIS — D62 Acute posthemorrhagic anemia: Secondary | ICD-10-CM | POA: Diagnosis present

## 2017-09-28 DIAGNOSIS — R079 Chest pain, unspecified: Secondary | ICD-10-CM | POA: Diagnosis not present

## 2017-09-28 DIAGNOSIS — R0689 Other abnormalities of breathing: Secondary | ICD-10-CM

## 2017-09-28 DIAGNOSIS — Z96641 Presence of right artificial hip joint: Secondary | ICD-10-CM | POA: Diagnosis present

## 2017-09-28 DIAGNOSIS — R0789 Other chest pain: Secondary | ICD-10-CM | POA: Diagnosis not present

## 2017-09-28 DIAGNOSIS — R339 Retention of urine, unspecified: Secondary | ICD-10-CM | POA: Diagnosis not present

## 2017-09-28 DIAGNOSIS — Z818 Family history of other mental and behavioral disorders: Secondary | ICD-10-CM

## 2017-09-28 DIAGNOSIS — I69322 Dysarthria following cerebral infarction: Secondary | ICD-10-CM | POA: Diagnosis not present

## 2017-09-28 DIAGNOSIS — F411 Generalized anxiety disorder: Secondary | ICD-10-CM | POA: Diagnosis not present

## 2017-09-28 DIAGNOSIS — I69991 Dysphagia following unspecified cerebrovascular disease: Secondary | ICD-10-CM | POA: Diagnosis not present

## 2017-09-28 DIAGNOSIS — J9601 Acute respiratory failure with hypoxia: Secondary | ICD-10-CM

## 2017-09-28 DIAGNOSIS — R911 Solitary pulmonary nodule: Secondary | ICD-10-CM | POA: Diagnosis present

## 2017-09-28 DIAGNOSIS — M6281 Muscle weakness (generalized): Secondary | ICD-10-CM | POA: Diagnosis not present

## 2017-09-28 DIAGNOSIS — Z931 Gastrostomy status: Secondary | ICD-10-CM

## 2017-09-28 DIAGNOSIS — R112 Nausea with vomiting, unspecified: Secondary | ICD-10-CM | POA: Diagnosis not present

## 2017-09-28 DIAGNOSIS — J189 Pneumonia, unspecified organism: Secondary | ICD-10-CM

## 2017-09-28 DIAGNOSIS — L89150 Pressure ulcer of sacral region, unstageable: Secondary | ICD-10-CM | POA: Diagnosis not present

## 2017-09-28 DIAGNOSIS — N3000 Acute cystitis without hematuria: Secondary | ICD-10-CM | POA: Diagnosis not present

## 2017-09-28 DIAGNOSIS — D473 Essential (hemorrhagic) thrombocythemia: Secondary | ICD-10-CM

## 2017-09-28 LAB — CBC WITH DIFFERENTIAL/PLATELET
Basophils Absolute: 0 10*3/uL (ref 0.0–0.1)
Basophils Relative: 0 %
EOS ABS: 0.1 10*3/uL (ref 0.0–0.7)
EOS PCT: 1 %
HCT: 32.3 % — ABNORMAL LOW (ref 36.0–46.0)
Hemoglobin: 10 g/dL — ABNORMAL LOW (ref 12.0–15.0)
LYMPHS PCT: 10 %
Lymphs Abs: 1 10*3/uL (ref 0.7–4.0)
MCH: 30.2 pg (ref 26.0–34.0)
MCHC: 31 g/dL (ref 30.0–36.0)
MCV: 97.6 fL (ref 78.0–100.0)
MONO ABS: 0.9 10*3/uL (ref 0.1–1.0)
Monocytes Relative: 10 %
NEUTROS ABS: 7.2 10*3/uL (ref 1.7–7.7)
Neutrophils Relative %: 79 %
PLATELETS: 395 10*3/uL (ref 150–400)
RBC: 3.31 MIL/uL — ABNORMAL LOW (ref 3.87–5.11)
RDW: 16.1 % — ABNORMAL HIGH (ref 11.5–15.5)
WBC: 9.2 10*3/uL (ref 4.0–10.5)

## 2017-09-28 LAB — COMPREHENSIVE METABOLIC PANEL
ALT: 26 U/L (ref 14–54)
ANION GAP: 8 (ref 5–15)
AST: 22 U/L (ref 15–41)
Albumin: 2.2 g/dL — ABNORMAL LOW (ref 3.5–5.0)
Alkaline Phosphatase: 78 U/L (ref 38–126)
BUN: 12 mg/dL (ref 6–20)
CHLORIDE: 103 mmol/L (ref 101–111)
CO2: 26 mmol/L (ref 22–32)
Calcium: 8.2 mg/dL — ABNORMAL LOW (ref 8.9–10.3)
Creatinine, Ser: 0.65 mg/dL (ref 0.44–1.00)
GFR calc non Af Amer: >60 mL/min (ref >60)
Glucose, Bld: 118 mg/dL — ABNORMAL HIGH (ref 65–99)
POTASSIUM: 3.4 mmol/L — AB (ref 3.5–5.1)
SODIUM: 137 mmol/L (ref 135–145)
Total Bilirubin: 0.5 mg/dL (ref 0.3–1.2)
Total Protein: 5.5 g/dL — ABNORMAL LOW (ref 6.5–8.1)

## 2017-09-28 LAB — HEPARIN LEVEL (UNFRACTIONATED): HEPARIN UNFRACTIONATED: 0.7 [IU]/mL (ref 0.30–0.70)

## 2017-09-28 LAB — PHOSPHORUS: PHOSPHORUS: 3 mg/dL (ref 2.5–4.6)

## 2017-09-28 LAB — GLUCOSE, CAPILLARY
GLUCOSE-CAPILLARY: 117 mg/dL — AB (ref 65–99)
GLUCOSE-CAPILLARY: 117 mg/dL — AB (ref 65–99)
Glucose-Capillary: 104 mg/dL — ABNORMAL HIGH (ref 65–99)
Glucose-Capillary: 125 mg/dL — ABNORMAL HIGH (ref 65–99)
Glucose-Capillary: 132 mg/dL — ABNORMAL HIGH (ref 65–99)

## 2017-09-28 LAB — MAGNESIUM: Magnesium: 1.8 mg/dL (ref 1.7–2.4)

## 2017-09-28 MED ORDER — POLYETHYLENE GLYCOL 3350 17 G PO PACK
17.0000 g | PACK | Freq: Two times a day (BID) | ORAL | Status: AC
  Administered 2017-09-28 – 2017-09-29 (×2): 17 g
  Filled 2017-09-28 (×2): qty 1

## 2017-09-28 MED ORDER — POLYETHYLENE GLYCOL 3350 17 G PO PACK
17.0000 g | PACK | Freq: Every day | ORAL | Status: AC
  Filled 2017-09-28 (×2): qty 1

## 2017-09-28 MED ORDER — ORAL CARE MOUTH RINSE: 15.0000 mL | Freq: Two times a day (BID) | OROMUCOSAL | Status: DC

## 2017-09-28 MED ORDER — ORAL CARE MOUTH RINSE
15.0000 mL | Freq: Two times a day (BID) | OROMUCOSAL | Status: DC
  Administered 2017-09-28 – 2017-10-24 (×45): 15 mL via OROMUCOSAL

## 2017-09-28 MED ORDER — FREE WATER
200.0000 mL | Freq: Three times a day (TID) | Status: DC
  Administered 2017-09-29 – 2017-10-24 (×75): 200 mL

## 2017-09-28 MED ORDER — SALINE SPRAY 0.65 % NA SOLN
1.0000 | NASAL | Status: DC | PRN
  Filled 2017-09-28: qty 44

## 2017-09-28 MED ORDER — ALUM & MAG HYDROXIDE-SIMETH 200-200-20 MG/5ML PO SUSP: 30.0000 mL | ORAL | Status: DC | PRN

## 2017-09-28 MED ORDER — LEVETIRACETAM 100 MG/ML PO SOLN
250.0000 mg | Freq: Two times a day (BID) | ORAL | Status: DC
  Administered 2017-09-28 – 2017-10-07 (×18): 250 mg
  Filled 2017-09-28 (×18): qty 2.5

## 2017-09-28 MED ORDER — LEVOFLOXACIN 500 MG PO TABS: 500.0000 mg | ORAL_TABLET | Freq: Every day | ORAL | Status: DC

## 2017-09-28 MED ORDER — LEVOFLOXACIN 500 MG PO TABS
500.0000 mg | ORAL_TABLET | Freq: Every day | ORAL | Status: AC
  Administered 2017-09-29 – 2017-09-30 (×2): 500 mg
  Filled 2017-09-28 (×2): qty 1

## 2017-09-28 MED ORDER — ATORVASTATIN CALCIUM 20 MG PO TABS
20.0000 mg | ORAL_TABLET | Freq: Every day | ORAL | Status: DC
  Administered 2017-09-28 – 2017-10-23 (×26): 20 mg
  Filled 2017-09-28 (×27): qty 1

## 2017-09-28 MED ORDER — ACETAMINOPHEN 160 MG/5ML PO SOLN
500.0000 mg | ORAL | Status: DC | PRN
  Administered 2017-10-01 – 2017-10-17 (×12): 500 mg
  Filled 2017-09-28 (×13): qty 20.3

## 2017-09-28 MED ORDER — IPRATROPIUM-ALBUTEROL 0.5-2.5 (3) MG/3ML IN SOLN
3.0000 mL | RESPIRATORY_TRACT | Status: DC | PRN
  Administered 2017-09-28 – 2017-10-23 (×5): 3 mL via RESPIRATORY_TRACT
  Filled 2017-09-28 (×6): qty 3

## 2017-09-28 MED ORDER — CHLORHEXIDINE GLUCONATE 0.12 % MT SOLN
15.0000 mL | Freq: Two times a day (BID) | OROMUCOSAL | Status: DC
  Administered 2017-09-29 – 2017-10-24 (×51): 15 mL via OROMUCOSAL
  Filled 2017-09-28 (×45): qty 15

## 2017-09-28 MED ORDER — LEVOFLOXACIN 500 MG PO TABS
500.0000 mg | ORAL_TABLET | Freq: Every day | ORAL | Status: DC
  Administered 2017-09-28: 500 mg
  Filled 2017-09-28: qty 1

## 2017-09-28 MED ORDER — FLEET ENEMA 7-19 GM/118ML RE ENEM: 1.0000 | ENEMA | Freq: Every day | RECTAL | Status: DC | PRN

## 2017-09-28 MED ORDER — POTASSIUM CHLORIDE 20 MEQ PO PACK
20.0000 meq | PACK | Freq: Two times a day (BID) | ORAL | Status: DC
  Filled 2017-09-28: qty 1

## 2017-09-28 MED ORDER — LIDOCAINE HCL 2 % EX GEL: CUTANEOUS | Status: DC | PRN

## 2017-09-28 MED ORDER — BISACODYL 10 MG RE SUPP
10.0000 mg | Freq: Every day | RECTAL | Status: DC | PRN
  Filled 2017-09-28: qty 1

## 2017-09-28 MED ORDER — CHLORHEXIDINE GLUCONATE 0.12 % MT SOLN: 15.0000 mL | Freq: Two times a day (BID) | OROMUCOSAL | Status: DC

## 2017-09-28 MED ORDER — APIXABAN 2.5 MG PO TABS: 2.5000 mg | ORAL_TABLET | Freq: Two times a day (BID) | ORAL | Status: DC

## 2017-09-28 MED ORDER — BUDESONIDE 0.25 MG/2ML IN SUSP
0.2500 mg | Freq: Two times a day (BID) | RESPIRATORY_TRACT | Status: DC
  Administered 2017-09-28 – 2017-10-13 (×25): 0.25 mg via RESPIRATORY_TRACT
  Filled 2017-09-28 (×32): qty 2

## 2017-09-28 MED ORDER — POTASSIUM CHLORIDE 20 MEQ PO PACK
20.0000 meq | PACK | Freq: Two times a day (BID) | ORAL | Status: AC
  Administered 2017-09-28 – 2017-09-29 (×2): 20 meq
  Filled 2017-09-28 (×2): qty 1

## 2017-09-28 MED ORDER — ASPIRIN 81 MG PO CHEW
81.0000 mg | CHEWABLE_TABLET | Freq: Every day | ORAL | Status: DC
  Administered 2017-09-29 – 2017-10-24 (×26): 81 mg
  Filled 2017-09-28 (×26): qty 1

## 2017-09-28 MED ORDER — INSULIN ASPART 100 UNIT/ML ~~LOC~~ SOLN
0.0000 [IU] | SUBCUTANEOUS | Status: DC
  Administered 2017-09-29 – 2017-10-12 (×18): 1 [IU] via SUBCUTANEOUS

## 2017-09-28 MED ORDER — APIXABAN 2.5 MG PO TABS
2.5000 mg | ORAL_TABLET | Freq: Two times a day (BID) | ORAL | Status: DC
  Administered 2017-09-28 – 2017-10-24 (×52): 2.5 mg
  Filled 2017-09-28 (×52): qty 1

## 2017-09-28 MED ORDER — OSMOLITE 1.2 CAL PO LIQD
1000.0000 mL | ORAL | Status: DC
  Filled 2017-09-28 (×2): qty 1000

## 2017-09-28 MED ORDER — SIMETHICONE 40 MG/0.6ML PO SUSP
40.0000 mg | Freq: Four times a day (QID) | ORAL | Status: DC | PRN
  Filled 2017-09-28: qty 0.6

## 2017-09-28 MED ORDER — OSMOLITE 1.2 CAL PO LIQD
1000.0000 mL | ORAL | Status: DC
  Administered 2017-09-28 – 2017-10-11 (×12): 1000 mL
  Filled 2017-09-28 (×32): qty 1000

## 2017-09-28 MED ORDER — METOPROLOL TARTRATE 25 MG/10 ML ORAL SUSPENSION
25.0000 mg | Freq: Two times a day (BID) | ORAL | Status: DC
  Administered 2017-09-28 – 2017-10-24 (×51): 25 mg
  Filled 2017-09-28 (×53): qty 10

## 2017-09-28 MED ORDER — PANTOPRAZOLE SODIUM 40 MG PO PACK
40.0000 mg | PACK | Freq: Two times a day (BID) | ORAL | Status: DC
  Administered 2017-09-28 – 2017-10-24 (×52): 40 mg
  Filled 2017-09-28 (×51): qty 20

## 2017-09-28 NOTE — Progress Notes (Signed)
Pt admitted to [phone] with daughter at bedside. Pt alert and responds with yes or no answers. Pt's vitals are stable and she has no complaints of pain. Pt oriented to unit and suction is set up at bedside. Continue plan of care.
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
## 2017-09-28 NOTE — Discharge Summary (Signed)
Physician Discharge Summary  Kayla Kent ZOX:[id number] DOB: Jan 01, 1932 DOA: 09/19/2017  PCP: Biagio Borg, MD  Admit date: 09/19/2017 Discharge date: 09/28/2017  Time spent: 45 minutes  Recommendations for Outpatient Follow-up:  1. DC to rehab today 2. FU with NEurology  3. PCP in  1week please discuss Thyroid FNA which noted indeterminate pathology   Discharge Diagnoses:    Recent  L MCA stroke   Anxiety   Essential hypertension   Atrial fibrillation with RVR (HCC)   Acute renal failure (HCC)   Gastroesophageal reflux disease   CKD (chronic kidney disease) stage 3, GFR 30-59 ml/min (HCC)   Chronic atrial fibrillation (HCC)   Dyslipidemia   COPD (chronic obstructive pulmonary disease) (HCC)   Acute hypoxemic respiratory failure (HCC)   Cerebral thrombosis with cerebral infarction   Aspiration pneumonia due to gastric secretions (HCC)   Acute ischemic left MCA stroke (HCC)   Acute respiratory failure with hypoxia (HCC)   Dysphagia   Dysphagia, post-stroke   Aspiration pneumonia of right lower lobe (HCC)   Stage 3 chronic kidney disease (HCC)   Thrombocytosis (HCC)   PAF (paroxysmal atrial fibrillation) ([place])   Severe protein-calorie malnutrition ([place])   Hypokalemia   Discharge Condition: stable  Diet recommendation: NPO, TFS  And meds via PEG  Filed Weights   09/26/17 0300 09/27/17 0402 09/28/17 0455  Weight: 42.2 kg (93 lb 0.6 oz) 43 kg (94 lb 12.8 oz) 46.4 kg (102 lb 4.7 oz)    History of present illness:  Kayla Kent a 83 y.o.femalewith medical history significant for stage III chronic kidney disease, dyslipidemia, chronic atrial fibrillation previously on Eliquis, hypertension, seizure disorder on Keppra, and recent outpatient fine-needle aspiration of thyroid nodule.  Patient was initially admitted on 11/20 secondary to acute upper GI bleeding Heme Positive as well as multidrug-resistant Klebsiella pneumonia.  On 11/25 patient was aphasic with  respiratory distress and was later found to have an acute MCA stroke. Her eliquis had been held due to the GI bleed.  11/25: underwent thrombolysis of left middle cerebral artery She was treated for aspiration pneumonia as well and was intubated. She was eventually discharged to CIR on 11/30 On 12/3 she was evaluated for fever and suspected to have continued Aspiration pneumonia and readmitted to the hospital.    Hospital Course:   Acute respiratory failure with hypoxia ([place]) 2/2 to Aspiration PNA -improving - Apparently completed 5 days of antibiotics on 11/25 - On last admission: sputum and blood cultures were negative & resp virus panel was Negative - Respiratory Virus Panel Negative -recently completed Abx course and another course now, s/p 5days and Abx stopped -continue TFs via PEG  Recent CVA with Dysphagia - Will return to CIR  -Tube feeds through Cortrack discontinued for now as patient pulled out Cotrak  - Repeat MBS showed that patient did not pass  - Dietician evaluated and adjusted TF - IR evaluated 12/8 and PEG placed; S/p PEG Tube by Dr. Kathlene Cote restarted TF -tolerating this  A-Fib/ Flutter -Was not on rate controlling agent -Heparin D/C'd now and restarted Eliquis through PEG Tube -Liquid Metoprolol 25 mg BID through Cortrak changed to via PEG -stable and HR controlled now  HTN (hypertension) -now stable -Started Patient on Metoprolol 25 mg BID through PEG  CKD (chronic kidney disease), stage III -Renal function stable and at baseline -BUN/Cr was 11/0.63  Seizure Disorder -IV Keppra changed to po Through PEG  GI bleed? -EGD was normal on 11/23 -Restarted  Home Apixaban  Thyroid nodule -RecentFNAof thyroid nodule with indeterminate pathology;testing ordered by PCP and patient can follow-up with this physician to discuss significance of findings  Dyslipidemia -Statin restarted through PEG  Thrombocytosis -Likely Reactive -Platelet  Count went from 412 -> 448 -> 383 -> 402 -> 420 -> 406 -improving  ? Nonsustained VTACH -Electrolytes wNL with K+ at 3.9, Mag at 1.8, and  -C/w Telemetry -Metoprolol 25 mg po BID through PEG  Severe Malnutrition in Context of Chronic Illness -Nutritionist evaluated  -TF Osmolite 1.2 @ 60 ml/hr (1440 ml daily) Providing, 1728 kcals, 80 g Pro, 1181 ml fluid +free water at 200 mlq 8 hrs through PEG Tube  Depression/Anxiety -Patient seems more depressed 2/2 to Recent Events of CVA  Code Status: Partial CODE; OK with Intubation no Shocks or Compressions Disposition Plan: back tio CIR  Consultants:   Interventional Radiology   Psychiatry   Procedures:  ECHOCARDIOGRAM Study Conclusions  - Left ventricle: The cavity size was normal. Wall thickness was increased in a pattern of mild LVH. Systolic function was normal. The estimated ejection fraction was in the range of 60% to 65%. Wall motion was normal; there were no regional wall motion abnormalities. - Mitral valve: There was mild regurgitation. - Left atrium: The atrium was severely dilated. - Right atrium: The atrium was mildly dilated. - Pulmonary arteries: Systolic pressure was mildly increased. PA peak pressure: 33 mm Hg (S).     Procedures:  PEG tube by IR  Consultations:  IR  Psychiatry  Rehab MD  Discharge Exam: Vitals:   09/28/17 1154 09/28/17 1200  BP: (!) 133/54 132/73  Pulse: 91 93  Resp: 18 20  Temp: (!) 97.3 F (36.3 C)   SpO2: 95% 96%    General: AAOx3 Cardiovascular: S1S2/RRR Respiratory: improved air movement  Discharge Instructions    Allergies as of 09/28/2017      Reactions   Chocolate Hives   Codeine Other (See Comments)   Patient states she acts crazy   Diazepam Other (See Comments)   Patient states she acts crazy.   Fruit & Vegetable Daily [nutritional Supplements] Hives, Swelling   peaches   Iohexol Hives    Code: HIVES, Desc: pt gets 13 hr  pre-meds, Onset Date: [id number]   Latex Hives   Peach [prunus Persica] Hives   Peanut-containing Drug Products Hives, Swelling   Penicillins Hives, Itching, Swelling   Has patient had a PCN reaction causing immediate rash, facial/tongue/throat swelling, SOB or lightheadedness with hypotension: Yes Has patient had a PCN reaction causing severe rash involving mucus membranes or skin necrosis: No Has patient had a PCN reaction that required hospitalization No Has patient had a PCN reaction occurring within the last 10 years: No If all of the above answers are "NO", then may proceed with Cephalosporin use.   Strawberry Extract Hives   Sulfonamide Derivatives Hives   nausea   Wheat Shortness Of Breath   Shortness of breath   Wheat Bran Shortness Of Breath   Sulfamethoxazole Hives   Crestor [rosuvastatin Calcium] Rash   Iodine Rash      Medication List    ASK your doctor about these medications   acetaminophen 325 MG tablet Commonly known as:  TYLENOL Take 2 tablets (650 mg total) by mouth every 4 (four) hours as needed for mild pain (or temp > 37.5 C (99.5 F)).   aspirin 300 MG suppository Place 1 suppository (300 mg total) rectally daily.   atorvastatin 20 MG tablet Commonly  known as:  LIPITOR Take 1 tablet (20 mg total) by mouth daily at 6 PM.   bisacodyl 10 MG suppository Commonly known as:  DULCOLAX Place 1 suppository (10 mg total) rectally daily as needed for moderate constipation.   chlorhexidine gluconate (MEDLINE KIT) 0.12 % solution Commonly known as:  PERIDEX 15 mLs by Mouth Rinse route 2 (two) times daily.   feeding supplement (OSMOLITE 1.2 CAL) Liqd Place 1,000 mLs into feeding tube continuous.   heparin 100-0.45 UNIT/ML-% infusion Inject 900 Units/hr into the vein continuous.   ipratropium-albuterol 0.5-2.5 (3) MG/3ML Soln Commonly known as:  DUONEB Take 3 mLs by nebulization every 4 (four) hours as needed.   levETIRAcetam 100 MG/ML solution Commonly  known as:  KEPPRA Take 2.5 mLs (250 mg total) by mouth 2 (two) times daily.   pantoprazole sodium 40 mg/20 mL Pack Commonly known as:  PROTONIX Place 20 mLs (40 mg total) into feeding tube daily at 12 noon.   senna-docusate 8.6-50 MG tablet Commonly known as:  Senokot-S 1 tablet by Per NG tube route at bedtime.   sodium chloride 0.65 % Soln nasal spray Commonly known as:  OCEAN Place 1 spray into both nostrils as needed for congestion.   sodium chloride 0.9 % infusion Inject 250 mLs into the vein as needed (if IV carrier fluid needed.).      Allergies  Allergen Reactions  . Chocolate Hives  . Codeine Other (See Comments)    Patient states she acts crazy  . Diazepam Other (See Comments)    Patient states she acts crazy.  . Fruit & Vegetable Daily [Nutritional Supplements] Hives and Swelling    peaches  . Iohexol Hives     Code: HIVES, Desc: pt gets 13 hr pre-meds, Onset Date: [id number]   . Latex Hives  . Peach [Prunus Persica] Hives  . Peanut-Containing Drug Products Hives and Swelling  . Penicillins Hives, Itching and Swelling    Has patient had a PCN reaction causing immediate rash, facial/tongue/throat swelling, SOB or lightheadedness with hypotension: Yes Has patient had a PCN reaction causing severe rash involving mucus membranes or skin necrosis: No Has patient had a PCN reaction that required hospitalization No Has patient had a PCN reaction occurring within the last 10 years: No If all of the above answers are "NO", then may proceed with Cephalosporin use.   . Strawberry Extract Hives  . Sulfonamide Derivatives Hives    nausea  . Wheat Shortness Of Breath    Shortness of breath  . Wheat Bran Shortness Of Breath  . Sulfamethoxazole Hives  . Crestor [Rosuvastatin Calcium] Rash  . Iodine Rash      The results of significant diagnostics from this hospitalization (including imaging, microbiology, ancillary and laboratory) are listed below for reference.     Significant Diagnostic Studies: Dg Chest 1 View  Result Date: 09/21/2017 CLINICAL DATA:  Shortness of breath and chest congestion. Images were obtained following a swallowing function radiograph. EXAM: CHEST 1 VIEW COMPARISON:  PA and lateral chest x-ray of September 20, 2017 FINDINGS: There is confluent airspace opacity in the inferior aspect of the right upper lobe. This has not appreciably changed since yesterday's study. The lung markings remain coarse in the retrocardiac region on the left with obscuration of the hemidiaphragm. The right infrahilar lung markings are mildly increased. There small bilateral pleural effusions greatest on the left. The heart is top-normal in size. The pulmonary vascularity is not engorged. The feeding tube tip projects below the inferior margin  of the image. There is calcification in the wall of the thoracic aorta. IMPRESSION: Right upper lobe pneumonia. Probable left lower lobe pneumonia and right infrahilar atelectasis. When compared to yesterday's study there has not been dramatic interval change. Thoracic aortic atherosclerosis. Electronically Signed   By: David  Martinique M.D.   On: 09/21/2017 14:24   Dg Chest 2 View  Result Date: 09/20/2017 CLINICAL DATA:  Acute respiratory failure, hypoxia, syncope EXAM: CHEST  2 VIEW COMPARISON:  Portable chest x-ray of 12 3 and 09/17/2017 FINDINGS: Opacities at both lung bases and in the right upper lobe persists mild consistent with multifocal pneumonia. Small effusions layer posteriorly. There does appear to have been some improvement in mild pulmonary vascular congestion. Cardiomegaly is stable. NG tube extends below the hemidiaphragm. IMPRESSION: 1. Persistent bibasilar opacities as well as right upper lobe opacity most consistent with multifocal pneumonia with small pleural effusions. 2. Some improvement in mild pulmonary vascular congestion. Electronically Signed   By: Ivar Drape M.D.   On: 09/20/2017 08:43   Dg Chest 2  View  Result Date: 09/06/2017 CLINICAL DATA:  Dizziness.  Vomiting.  Third mental status . EXAM: CHEST  2 VIEW COMPARISON:  CT 08/23/2017. PET-CT 05/25/2017. Chest x-ray 04/14/2017. FINDINGS: Cardiomegaly with increase interstitial prominence bilaterally, particular on the left. Small bilateral pleural effusions cannot be excluded. Findings suggest CHF. Bilateral pneumonia cannot be excluded. Reference is made to prior PET-CT report of 05/25/2017. IMPRESSION: Findings consistent with congestive heart failure with bilateral from interstitial edema. Small bilateral pleural effusions cannot be excluded. Electronically Signed   By: Marcello Moores  Register   On: 09/06/2017 13:36   Ct Head Wo Contrast  Result Date: 09/11/2017 CLINICAL DATA:  Status post stroke intervention. EXAM: CT HEAD WITHOUT CONTRAST TECHNIQUE: Contiguous axial images were obtained from the base of the skull through the vertex without intravenous contrast. COMPARISON:  Head CT 09/11/2017 Cervical angiogram 09/11/2017 FINDINGS: Brain: There is a small amount of petechial hemorrhage versus contrast staining of the medial aspect of the left frontal operculum, coronal image 38. There is no intraparenchymal hematoma. No mass effect. No extra-axial collection. Anterior right temporal lobe encephalomalacia is unchanged. There is periventricular hypoattenuation compatible with chronic microvascular disease. No hydrocephalus. Vascular: Intravascular hyperattenuation is likely persisting contrast material. Skull: Normal visualized skull base, calvarium and extracranial soft tissues. Sinuses/Orbits: No sinus fluid levels or advanced mucosal thickening. No mastoid effusion. Normal orbits. IMPRESSION: 1. Small focus of contrast staining versus petechial hemorrhage at the medial aspect of the left frontal operculum. 2. No intraparenchymal hematoma, mass effect or hydrocephalus. Electronically Signed   By: Ulyses Jarred M.D.   On: 09/11/2017 23:42   Ct Head Wo  Contrast  Result Date: 09/06/2017 CLINICAL DATA:  Altered mental status with questionable fall. Slurred speech. EXAM: CT HEAD WITHOUT CONTRAST TECHNIQUE: Contiguous axial images were obtained from the base of the skull through the vertex without intravenous contrast. COMPARISON:  April 06, 2017 head CT; brain MRI September 23, 2014 FINDINGS: Brain: There is mild diffuse atrophy. There is no intracranial mass, hemorrhage, extra-axial fluid collection, or midline shift. There is chronic right temporal lobe encephalomalacia, stable. There is a prior infarct involving the head of the caudate nucleus on the right. There is patchy small vessel disease in the centra semiovale bilaterally, stable. No acute infarct demonstrable on this study. Vascular: No hyperdense vessel. There is calcification in each carotid siphon and distal vertebral artery. Skull: The bony calvarium appears intact. Small bony enostosis arising from the left  frontal bone. There is evidence of old trauma with remodeling involving the left mandibular condyle with osteoarthritic change in this area. Sinuses/Orbits: There is mucosal thickening in several ethmoid air cells. Other visualized paranasal sinuses are clear. Orbits appear symmetric bilaterally except for evidence of cataract removal on the left. Other: There is chronic opacification of several inferior mastoid air cells. Most mastoid air cells bilaterally are clear. IMPRESSION: 1. Atrophy with periventricular small vessel disease, stable. Chronic encephalomalacia right temporal lobe, stable. 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 No new gray-white compartment lesion. No acute infarct. 2. No appreciable mass, hemorrhage, or extra-axial fluid collection. 3.  Foci of arterial vascular calcification evident. 4. Areas of paranasal sinus disease. Mild opacification of several inferior mastoid air cells bilaterally, stable. Electronically Signed   By: Lowella Grip III M.D.   On:  09/06/2017 12:01   Ct Chest Wo Contrast  Result Date: 09/11/2017 CLINICAL DATA:  83 year old female with history of shortness of breath. EXAM: CT CHEST WITHOUT CONTRAST TECHNIQUE: Multidetector CT imaging of the chest was performed following the standard protocol without IV contrast. COMPARISON:  Chest CT 08/23/2017. FINDINGS: Cardiovascular: Heart size is mildly enlarged. There is no significant pericardial fluid, thickening or pericardial calcification. There is aortic atherosclerosis, as well as atherosclerosis of the great vessels of the mediastinum and the coronary arteries, including calcified atherosclerotic plaque in the left main, left anterior descending, left circumflex and right coronary arteries. Mediastinum/Nodes: No pathologically enlarged mediastinal or hilar lymph nodes. Please note that accurate exclusion of hilar adenopathy is limited on noncontrast CT scans. Esophagus is unremarkable in appearance. No axillary lymphadenopathy. Lungs/Pleura: Multifocal asymmetrically distributed airspace consolidation throughout the lungs bilaterally, concerning for multilobar pneumonia. Previously noted right upper lobe pulmonary nodule is now largely obscured by adjacent airspace disease and is not readily identifiable. Moderate bilateral pleural effusions. Near complete atelectasis of the left lower lobe with some additional dependent atelectasis in the right lower lobe. Upper Abdomen: Aortic atherosclerosis. Musculoskeletal: There are no aggressive appearing lytic or blastic lesions noted in the visualized portions of the skeleton. IMPRESSION: 1. Findings are most compatible with severe multilobar bronchopneumonia. 2. Moderate bilateral pleural effusions with areas of dependent atelectasis in the lower lobes of lungs bilaterally. 3. Cardiomegaly. 4. Aortic atherosclerosis, in addition to left main and 3 vessel coronary artery disease. 5. Previously treated right upper lobe pulmonary nodule is now obscured  by overlying airspace disease. Attention on followup studies is recommended. Aortic Atherosclerosis (ICD10-I70.0). Electronically Signed   By: Vinnie Langton M.D.   On: 09/11/2017 11:06   Mr Brain Wo Contrast  Result Date: 09/13/2017 CLINICAL DATA:  Stroke follow-up.  Aphasia and right-sided weakness. EXAM: MRI HEAD WITHOUT CONTRAST TECHNIQUE: Multiplanar, multiecho pulse sequences of the brain and surrounding structures were obtained without intravenous contrast. COMPARISON:  Head CT 09/11/2017 FINDINGS: Brain: The midline structures are normal. There is diffusion restriction predominantly within the left precentral gyrus but also extending into the postcentral gyrus. Encephalomalacia is again noted at the right temporal pole. There is cytotoxic edema within the left frontal operculum. Scattered areas of white matter hyperintensity consistent with chronic microvascular disease. No hemorrhage. No age-advanced or lobar predominant atrophy. No chronic microhemorrhage or superficial siderosis. Vascular: Major intracranial arterial and venous sinus flow voids are preserved. Skull and upper cervical spine: The visualized skull base, calvarium, upper cervical spine and extracranial soft tissues are normal. Sinuses/Orbits: No fluid levels or advanced mucosal thickening. No mastoid or middle ear effusion. Normal orbits. IMPRESSION:  1. Acute ischemic infarct within the left hemisphere, predominantly located within the left precentral gyrus and frontal operculum, but also involving the left postcentral gyrus. 2. No hemorrhage or mass effect. 3. Findings of chronic microvascular disease. Electronically Signed   By: Ulyses Jarred M.D.   On: 09/13/2017 05:54   Ir Gastrostomy Tube Mod Sed  Result Date: 09/26/2017 CLINICAL DATA:  Cerebral infarction and need for percutaneous gastrostomy tube for nutrition. EXAM: PERCUTANEOUS GASTROSTOMY TUBE PLACEMENT ANESTHESIA/SEDATION: 1.0 mg IV Versed; 50 mcg IV Fentanyl. Total  Moderate Sedation Time 10 minutes. The patient's level of consciousness and physiologic status were continuously monitored during the procedure by Radiology nursing. CONTRAST:  5 mL Isovue 300 contrast injected into the lumen of the stomach. MEDICATIONS: No additional medications. The patient is currently receiving IV vancomycin daily and additional prophylactic antibiotic was not given for the procedure. FLUOROSCOPY TIME:  4 minutes and 30 seconds.  3.3 mGy. PROCEDURE: The procedure, risks, benefits, and alternatives were explained to the patient's daughter. Questions regarding the procedure were encouraged and answered. The patient's daughter understands and consents to the procedure. A 5-French catheter was then advanced through the the patient's mouth under fluoroscopy into the esophagus and to the level of the stomach. This catheter was used to insufflate the stomach with air under fluoroscopy. The abdominal wall was prepped with chlorhexidine in a sterile fashion, and a sterile drape was applied covering the operative field. A sterile gown and sterile gloves were used for the procedure. Local anesthesia was provided with 1% Lidocaine. A skin incision was made in the upper abdominal wall. Under fluoroscopy, an 18 gauge trocar needle was advanced into the stomach. Contrast injection was performed to confirm intraluminal position of the needle tip. A single T tack was then deployed in the lumen of the stomach. This was brought up to tension at the skin surface. Over a guidewire, a 9-French sheath was advanced into the lumen of the stomach. The wire was left in place as a safety wire. A loop snare device from a percutaneous gastrostomy kit was then advanced into the stomach. A floppy guide wire was advanced through the orogastric catheter under fluoroscopy in the stomach. The loop snare advanced through the percutaneous gastric access was used to snare the guide wire. This allowed withdrawal of the loop snare out  of the patient's mouth by retraction of the orogastric catheter and wire. A 20-French bumper retention gastrostomy tube was looped around the snare device. It was then pulled back through the patient's mouth. The retention bumper was brought up to the anterior gastric wall. The T tack suture was cut at the skin. The exiting gastrostomy tube was cut to appropriate length and a feeding adapter applied. The catheter was injected with contrast material to confirm position and a fluoroscopic spot image saved. The tube was then flushed with saline. A dressing was applied over the gastrostomy exit site. COMPLICATIONS: None. FINDINGS: The stomach distended well with air allowing safe placement of the gastrostomy tube. After placement, the tip of the gastrostomy tube lies in the body of the stomach. IMPRESSION: Percutaneous gastrostomy with placement of a 20-French bumper retention tube in the body of the stomach. This tube can be used beginning in 24 hours after placement. Electronically Signed   By: Aletta Edouard M.D.   On: 09/26/2017 12:10   Dg Chest Port 1 View  Result Date: 09/28/2017 CLINICAL DATA:  Followup shortness of breath and pneumonia. EXAM: PORTABLE CHEST 1 VIEW COMPARISON:  09/25/2017  FINDINGS: Chronic cardiomegaly and aortic atherosclerosis. Chronic markings persist in the left lung. Right perihilar pneumonia for cysts, similar in the suprahilar region and possibly worsened in the lower lung. No developing effusion of significance. IMPRESSION: Persistent pneumonia in the right suprahilar region. Worsening of pneumonia in the right infrahilar region. Electronically Signed   By: Nelson Chimes M.D.   On: 09/28/2017 07:42   Dg Chest Port 1 View  Result Date: 09/25/2017 CLINICAL DATA:  Followup pneumonia EXAM: PORTABLE CHEST 1 VIEW COMPARISON:  09/24/2017 FINDINGS: Artifact overlies chest. The patient's left upper extremity overlies the chest. Line for this, there is no suspected change in bilateral  pneumonia most pronounced in the right upper lobe and left lower lobe. IMPRESSION: Overlying artifact.  No change in bilateral pneumonia suspected. Electronically Signed   By: Nelson Chimes M.D.   On: 09/25/2017 07:52   Dg Chest Port 1 View  Result Date: 09/24/2017 CLINICAL DATA:  Shortness of breath; hx of HTN and emphysema EXAM: PORTABLE CHEST 1 VIEW COMPARISON:  09/23/2017 FINDINGS: Right upper lobe confluent airspace lung opacity is similar to the previous day's study. Hazy type right lower lung opacity is also stable. Lungs are hyperexpanded. There is mild interstitial thickening most evident in the bases, and scarring/atelectasis in the right lateral lung base. Interstitial thickening and central vascular congestion appears improved from the previous day's study. No new lung abnormalities. No definite pleural effusion.  No pneumothorax. Enteric feeding tube passes below the diaphragm, unchanged. IMPRESSION: 1. Mild improvement in overall lung aeration with a decrease in central vascular congestion and interstitial thickening. This suggests improved pulmonary edema. 2. Persistent right upper lobe consolidation, which suggests pneumonia. 3. No new abnormalities. 4. COPD. Electronically Signed   By: Lajean Manes M.D.   On: 09/24/2017 09:18   Dg Chest Port 1 View  Result Date: 09/23/2017 CLINICAL DATA:  Shortness of breath. EXAM: PORTABLE CHEST 1 VIEW COMPARISON:  09/21/2017 . FINDINGS: Feeding tube noted with tip below left hemidiaphragm. Stable cardiomegaly. Persistent right upper lobe infiltrate. Increased interstitial markings noted bilaterally. Component congestive heart failure cannot be excluded. Small bilateral pleural effusions. No pneumothorax . IMPRESSION: 1. Feeding tube noted with tip below left hemidiaphragm. 2. Persistent right upper lobe infiltrate consistent with pneumonia. Component of atelectasis also present. 3. Cardiomegaly. Increased pulmonary interstitial prominence and small  bilateral pleural effusions. Findings suggest a component of CHF. Electronically Signed   By: Marcello Moores  Register   On: 09/23/2017 08:37   Dg Chest Port 1 View  Result Date: 09/19/2017 CLINICAL DATA:  Shortness of breath. EXAM: PORTABLE CHEST 1 VIEW COMPARISON:  09/17/2017 FINDINGS: Enteric catheter descends to the abdomen, collimated off the image. The cardiac silhouette is enlarged. Calcific atherosclerotic disease of the aorta. No evidence of pneumothorax. Bilateral small pleural effusions. Possible left lower lobe airspace consolidation. Ground-glass opacities first nodules in the right lower and right upper lobes. Hyperinflation of the lungs. Osseous structures are without acute abnormality. Soft tissues are grossly normal. IMPRESSION: Bilateral small pleural effusions with patchy left lower lobe airspace consolidation and pulmonary nodules versus areas of ground-glass airspace consolidation in the right upper and right lower lobes. Electronically Signed   By: Fidela Salisbury M.D.   On: 09/19/2017 01:44   Dg Chest Port 1 View  Result Date: 09/17/2017 CLINICAL DATA:  Respiratory failure EXAM: PORTABLE CHEST 1 VIEW COMPARISON:  09/16/2017 FINDINGS: There is hyperinflation of the lungs compatible with COPD. Cardiomegaly. Patchy bilateral airspace disease again noted, not significantly changed. Areas of  consolidation most pronounced in the left base and right upper lobe. Small effusions. IMPRESSION: COPD.  Cardiomegaly. Patchy bilateral airspace disease and small effusions, unchanged. Findings concerning for multifocal pneumonia. Electronically Signed   By: Rolm Baptise M.D.   On: 09/17/2017 07:39   Dg Chest Portable 1 View  Result Date: 09/16/2017 CLINICAL DATA:  Aspiration pneumonia EXAM: PORTABLE CHEST 1 VIEW COMPARISON:  09/15/2017 FINDINGS: Patchy bilateral airspace opacities are again noted superimposed on COPD. Cardiomegaly. No change since prior study. Small effusions, left greater than right.  IMPRESSION: Attending patchy bilateral airspace disease and small effusions. COPD.  Cardiomegaly. No change. Electronically Signed   By: Rolm Baptise M.D.   On: 09/16/2017 09:42   Dg Chest Port 1 View  Result Date: 09/15/2017 CLINICAL DATA:  83 year old female status post left MCA emergent large vessel occlusion and endovascular revascularization. EXAM: PORTABLE CHEST 1 VIEW COMPARISON:  09/13/2017 and earlier. FINDINGS: Portable AP upright view at 1306 hours. Extubated. NG type tube has been removed and an enteric feeding tube courses to the abdomen, tip not occluded. Mildly lower lung volumes. Mildly increased bilateral perihilar opacity, most apparent in the right upper lung. Continued dense opacification at the left lung base. Stable cardiac size and mediastinal contours. Calcified aortic atherosclerosis. Stable visualized osseous structures. IMPRESSION: 1. Extubated.  NG tube removed and feeding tube placed. 2. Lower lung volumes with increasing bilateral perihilar opacity. Consider mild or developing pulmonary edema. 3. Continued dense left lung base opacity compatible with a combination of pleural effusion and lung base collapse/consolidation. Electronically Signed   By: Genevie Ann M.D.   On: 09/15/2017 13:42   Dg Chest Port 1 View  Result Date: 09/13/2017 CLINICAL DATA:  83 year old female status post left MCA emergent large vessel occlusion and endovascular revascularization. EXAM: PORTABLE CHEST 1 VIEW COMPARISON:  Portable chest 09/11/2017 and earlier. FINDINGS: Portable AP semi upright view at 0614 hours. Stable endotracheal tube tip just below the level the clavicles. Enteric tube courses to the abdomen, tip not included. Large lung volumes with coarse bilateral pulmonary interstitial opacity bilateral perihilar opacity has mildly regressed since 09/11/2017. Stable confluent left lung base opacity with obscuration of the left hemidiaphragm. No pneumothorax. No definite right pleural effusion.  Stable cardiac size and mediastinal contours. Calcified aortic atherosclerosis. IMPRESSION: 1.  Stable lines and tubes. 2. Continued left pleural effusion with associated left lung base collapse or consolidation. 3. Coarse bilateral pulmonary interstitial opacity but regression of indistinct perihilar opacity (perhaps perihilar pulmonary edema) since 09/11/2017. Electronically Signed   By: Genevie Ann M.D.   On: 09/13/2017 07:40   Dg Chest Port 1 View  Result Date: 09/12/2017 CLINICAL DATA:  Endotracheal and OG tube placements EXAM: PORTABLE CHEST 1 VIEW COMPARISON:  09/11/2017 FINDINGS: Interval placement of an endotracheal tube with tip measuring 4.5 cm above the carina. An enteric tube has been placed. The tip is off the field of view but is well below the left hemidiaphragm. Heart size is normal. Again, there is diffuse bilateral airspace disease with a mostly perihilar distribution. Probable left pleural effusion. No pneumothorax. Calcification of the aorta. IMPRESSION: Appliances appear in satisfactory position. Again, there is diffuse bilateral pulmonary infiltration with small left pleural effusion. Changes likely represent multifocal pneumonia, ARDS, or edema. Electronically Signed   By: Lucienne Capers M.D.   On: 09/12/2017 00:20   Dg Chest Port 1 View  Result Date: 09/11/2017 CLINICAL DATA:  83 year old female with history of fever for 1 day. EXAM: PORTABLE CHEST 1  VIEW COMPARISON:  Chest x-ray 09/07/2017. FINDINGS: Decreasing lung volumes with worsening aeration throughout the mid to lower lungs bilaterally, with air bronchograms evident in the right lower lobe, concerning for progressive multilobar pneumonia. Small right and moderate left pleural effusions. Pulmonary vasculature is obscured. No pneumothorax. Cardiomegaly. The patient is rotated to the left on today's exam, resulting in distortion of the mediastinal contours and reduced diagnostic sensitivity and specificity for mediastinal  pathology. Atherosclerosis in the thoracic aorta. IMPRESSION: 1. Findings are compatible with worsening severe multilobar pneumonia, most evident throughout the mid to lower lungs bilaterally. 2. Small right and moderate left parapneumonic pleural effusions. 3. Cardiomegaly. 4. Aortic atherosclerosis. Electronically Signed   By: Vinnie Langton M.D.   On: 09/11/2017 08:09   Dg Chest Port 1 View  Result Date: 09/07/2017 CLINICAL DATA:  Rapid atrial fibrillation. EXAM: PORTABLE CHEST 1 VIEW COMPARISON:  Chest x-ray from yesterday. FINDINGS: The left costophrenic angle is excluded from the field of view. Stable cardiomegaly and increased interstitial markings. Increased patchy opacities in the right lower lobe. No pneumothorax or large pleural effusion. No acute osseous abnormality. IMPRESSION: 1. Increased patchy opacities in the right lower lobe, concerning for aspiration or pneumonia. 2. Stable cardiomegaly and interstitial pulmonary edema. Electronically Signed   By: Titus Dubin M.D.   On: 09/07/2017 16:21   Dg Swallowing Func-speech Pathology  Result Date: 09/21/2017 Objective Swallowing Evaluation: Type of Study: MBS-Modified Barium Swallow Study  Patient Details Name: BRIGHID KOCH MRN: [id number] Date of Birth: 02/24/1932 Today's Date: 09/21/2017 Time: SLP Start Time (ACUTE ONLY): 5277 -SLP Stop Time (ACUTE ONLY): 1403 SLP Time Calculation (min) (ACUTE ONLY): 16 min Past Medical History: Past Medical History: Diagnosis Date . Allergic rhinitis  . Anxiety  . CKD (chronic kidney disease) stage 3, GFR 30-59 ml/min (HCC)  . Emphysema of lung ([place])  . HLD (hyperlipidemia)  . HTN (hypertension)  . Intolerance of drug   orthostatic . Paroxysmal supraventricular tachycardia ([place])  . PVD (peripheral vascular disease) ([place])  . Seizure ([place])  . Syncope and collapse  . Uterine prolapse without mention of vaginal wall prolapse  Past Surgical History: Past Surgical History: Procedure Laterality Date .  CARDIOVERSION N/A 11/05/2014  Procedure: CARDIOVERSION;  Surgeon: Candee Furbish, MD;  Location: [facility] ENDOSCOPY;  Service: Cardiovascular;  Laterality: N/A; . cataract surgery  08/2015 . CHOLECYSTECTOMY   . corrective eye surgery    as a child . ESOPHAGOGASTRODUODENOSCOPY N/A 09/09/2017  Procedure: ESOPHAGOGASTRODUODENOSCOPY (EGD);  Surgeon: Mauri Pole, MD;  Location: [facility] ENDOSCOPY;  Service: Endoscopy;  Laterality: N/A; . INTRAMEDULLARY (IM) NAIL INTERTROCHANTERIC Left 11/21/2016  Procedure: INTRAMEDULLARY (IM) NAIL INTERTROCHANTRIC HEMI;  Surgeon: Newt Minion, MD;  Location: [place];  Service: Orthopedics;  Laterality: Left; . IR PERCUTANEOUS ART THROMBECTOMY/INFUSION INTRACRANIAL INC DIAG ANGIO  09/11/2017 . IVD removed   . OPEN REDUCTION INTERNAL FIXATION (ORIF) DISTAL RADIAL FRACTURE Right 08/29/2014  Procedure: OPEN REDUCTION INTERNAL FIXATION (ORIF) DISTAL RADIAL FRACTURE;  Surgeon: Marianna Payment, MD;  Location: [place];  Service: Orthopedics;  Laterality: Right; . RADIOLOGY WITH ANESTHESIA N/A 09/11/2017  Procedure: RADIOLOGY WITH ANESTHESIA;  Surgeon: Luanne Bras, MD;  Location: [place];  Service: Radiology;  Laterality: N/A; . RF ablation PSVT    summer '10 . stress cardiolite  08/05/93 . TONSILLECTOMY   . TOTAL HIP ARTHROPLASTY Right 08/29/2014  Procedure: Right Hip Hemi Arthroplasty;  Surgeon: Marianna Payment, MD;  Location: [place];  Service: Orthopedics;  Laterality: Right;  Hip procedure 1st wants Peg  Board, [employer], [place].  HPI: Pt is an 83 y.o.femalewith medical history significant for stage III chronic kidney disease, dyslipidemia, chronic atrial fibrillation previously on Eliquis, hypertension, seizure disorder on Keppra, and recent outpatient fine-needle aspiration of thyroid nodule. 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 She was intubated 11/25-11/27. MBS  completed 11/28 showed a mdoerate-severe oropharyngeal dysphagia with delays in swallow initiation adn pharyngeal weakness. She was recommended to remain NPO  with therapeutic trials of puree. Pt went to CIR on 11/30 where she continued to work on swallow initiation but was readmitted to the hospital on 12/3 with suspected PNA.  Subjective: pt is alert today Assessment / Plan / Recommendation CHL IP CLINICAL IMPRESSIONS 09/21/2017 Clinical Impression Pt continues to present with a moderate-severe oropharyngeal dysphagia. She still has oral holding, right-sided weakness that allows for anterior spillage and incomplete clearance, and delayed swallow initiation to the pyriform sinuses. She has sensed aspiration with honey thick liquids during the swallow that cannot be sufficiently cleared, and moderate pharyngeal residue that is repeatedly penetrated into the airway as pt makes attempts to clear it (both cued and spontaneously) before it falls back in. Pureed solids are penetrated into the airway during the swallow and after, in a similar cyclical manner. Pt has mildly less residue and improved airway protection with honey thick liquids by spoon while using a chin tuck, but she needed Max-Total A for oral acceptance (unable to take boluses in by the cup or straw) and is anteriorly spilling more of what she does take in when in this posture. Study was limited by pt's continued coughing, sputtering, and seemingly difficult attempts at managing her secretions particularly when mixed with barium trials. There is still not a safe diet option to be started - I would continue with therapeutic trials of honey thick liquids or purees by spoon with use of a chin tuck. I think that it would be a laborous and high risk task to try to attempt a PO diet at this time, although the family could consider comfort feeds should they continue to have concerns about the use of a feeding tube. SLP Visit Diagnosis Dysphagia, oropharyngeal  phase (R13.12) Attention and concentration deficit following -- Frontal lobe and executive function deficit following -- Impact on safety and function Severe aspiration risk;Risk for inadequate nutrition/hydration   CHL IP TREATMENT RECOMMENDATION 09/21/2017 Treatment Recommendations Therapy as outlined in treatment plan below   Prognosis 09/21/2017 Prognosis for Safe Diet Advancement Fair Barriers to Reach Goals Cognitive deficits;Severity of deficits;Time post onset Barriers/Prognosis Comment -- CHL IP DIET RECOMMENDATION 09/21/2017 SLP Diet Recommendations NPO;Alternative means - temporary Liquid Administration via -- Medication Administration Via alternative means Compensations -- Postural Changes --   CHL IP OTHER RECOMMENDATIONS 09/21/2017 Recommended Consults -- Oral Care Recommendations Oral care QID Other Recommendations Have oral suction available   CHL IP FOLLOW UP RECOMMENDATIONS 09/21/2017 Follow up Recommendations Inpatient Rehab   CHL IP FREQUENCY AND DURATION 09/21/2017 Speech Therapy Frequency (ACUTE ONLY) min 2x/week Treatment Duration 2 weeks      CHL IP ORAL PHASE 09/21/2017 Oral Phase Impaired Oral - Pudding Teaspoon -- Oral - Pudding Cup -- Oral - Honey Teaspoon Right anterior bolus loss;Weak lingual manipulation;Reduced posterior propulsion;Holding of bolus;Right pocketing in lateral sulci;Premature spillage Oral - Honey Cup NT Oral - Nectar Teaspoon -- Oral - Nectar Cup -- Oral - Nectar Straw -- Oral -  Thin Teaspoon -- Oral - Thin Cup NT Oral - Thin Straw -- Oral - Puree Weak lingual manipulation;Reduced posterior propulsion;Holding of bolus;Right pocketing in lateral sulci;Premature spillage Oral - Mech Soft -- Oral - Regular -- Oral - Multi-Consistency -- Oral - Pill -- Oral Phase - Comment --  CHL IP PHARYNGEAL PHASE 09/21/2017 Pharyngeal Phase Impaired Pharyngeal- Pudding Teaspoon -- Pharyngeal -- Pharyngeal- Pudding Cup -- Pharyngeal -- Pharyngeal- Honey Teaspoon Delayed swallow  initiation-pyriform sinuses;Reduced pharyngeal peristalsis;Reduced airway/laryngeal closure;Reduced tongue base retraction;Moderate aspiration;Penetration/Aspiration during swallow;Penetration/Apiration after swallow;Pharyngeal residue - valleculae;Pharyngeal residue - pyriform;Pharyngeal residue - posterior pharnyx Pharyngeal Material enters airway, passes BELOW cords and not ejected out despite cough attempt by patient Pharyngeal- Honey Cup -- Pharyngeal -- Pharyngeal- Nectar Teaspoon -- Pharyngeal -- Pharyngeal- Nectar Cup -- Pharyngeal -- Pharyngeal- Nectar Straw -- Pharyngeal -- Pharyngeal- Thin Teaspoon -- Pharyngeal -- Pharyngeal- Thin Cup NT Pharyngeal -- Pharyngeal- Thin Straw -- Pharyngeal -- Pharyngeal- Puree Delayed swallow initiation-pyriform sinuses;Reduced pharyngeal peristalsis;Reduced airway/laryngeal closure;Reduced tongue base retraction;Penetration/Aspiration during swallow;Pharyngeal residue - valleculae;Pharyngeal residue - pyriform;Pharyngeal residue - posterior pharnyx Pharyngeal Material enters airway, remains ABOVE vocal cords then ejected out Pharyngeal- Mechanical Soft -- Pharyngeal -- Pharyngeal- Regular -- Pharyngeal -- Pharyngeal- Multi-consistency -- Pharyngeal -- Pharyngeal- Pill -- Pharyngeal -- Pharyngeal Comment --  CHL IP CERVICAL ESOPHAGEAL PHASE 09/21/2017 Cervical Esophageal Phase WFL Pudding Teaspoon -- Pudding Cup -- Honey Teaspoon -- Honey Cup -- Nectar Teaspoon -- Nectar Cup -- Nectar Straw -- Thin Teaspoon -- Thin Cup -- Thin Straw -- Puree -- Mechanical Soft -- Regular -- Multi-consistency -- Pill -- Cervical Esophageal Comment -- No flowsheet data found. Germain Osgood 09/21/2017, 3:08 PM  Germain Osgood, M.A. CCC-SLP [phone]             Dg Swallowing Func-speech Pathology  Result Date: 09/14/2017 Completed by Aaron Edelman, SLP Student. Supervised and reviewed by Herbie Baltimore MA CCC-SLP Objective Swallowing Evaluation: Type of Study: MBS-Modified  Barium Swallow Study  Patient Details Name: Kayla Kent MRN: [id number] Date of Birth: 1932/02/13 Today's Date: 09/14/2017 Time: SLP Start Time (ACUTE ONLY): 1020 -SLP Stop Time (ACUTE ONLY): 1037 SLP Time Calculation (min) (ACUTE ONLY): 17 min Past Medical History: Past Medical History: Diagnosis Date . Allergic rhinitis  . Anxiety  . CKD (chronic kidney disease) stage 3, GFR 30-59 ml/min (HCC)  . Emphysema of lung ([place])  . HLD (hyperlipidemia)  . HTN (hypertension)  . Intolerance of drug   orthostatic . Paroxysmal supraventricular tachycardia ([place])  . PVD (peripheral vascular disease) ([place])  . Seizure ([place])  . Syncope and collapse  . Uterine prolapse without mention of vaginal wall prolapse  Past Surgical History: Past Surgical History: Procedure Laterality Date . CARDIOVERSION N/A 11/05/2014  Procedure: CARDIOVERSION;  Surgeon: Candee Furbish, MD;  Location: [facility] ENDOSCOPY;  Service: Cardiovascular;  Laterality: N/A; . cataract surgery  08/2015 . CHOLECYSTECTOMY   . corrective eye surgery    as a child . ESOPHAGOGASTRODUODENOSCOPY N/A 09/09/2017  Procedure: ESOPHAGOGASTRODUODENOSCOPY (EGD);  Surgeon: Mauri Pole, MD;  Location: [facility] ENDOSCOPY;  Service: Endoscopy;  Laterality: N/A; . INTRAMEDULLARY (IM) NAIL INTERTROCHANTERIC Left 11/21/2016  Procedure: INTRAMEDULLARY (IM) NAIL INTERTROCHANTRIC HEMI;  Surgeon: Newt Minion, MD;  Location: [place];  Service: Orthopedics;  Laterality: Left; . IR PERCUTANEOUS ART THROMBECTOMY/INFUSION INTRACRANIAL INC DIAG ANGIO  09/11/2017 . IVD removed   . OPEN REDUCTION INTERNAL FIXATION (ORIF) DISTAL RADIAL FRACTURE Right 08/29/2014  Procedure: OPEN REDUCTION INTERNAL FIXATION (ORIF) DISTAL RADIAL FRACTURE;  Surgeon: Marianna Payment, MD;  Location: [place];  Service: Orthopedics;  Laterality: Right; . RADIOLOGY WITH ANESTHESIA N/A 09/11/2017  Procedure: RADIOLOGY WITH ANESTHESIA;  Surgeon: Luanne Bras, MD;  Location: [place];  Service: Radiology;  Laterality: N/A; . RF  ablation PSVT    summer '10 . stress cardiolite  08/05/93 . TONSILLECTOMY   . TOTAL HIP ARTHROPLASTY Right 08/29/2014  Procedure: Right Hip Hemi Arthroplasty;  Surgeon: Marianna Payment, MD;  Location: [place];  Service: Orthopedics;  Laterality: Right;  Hip procedure 1st wants Peg Board, [employer], [place].  HPI: Kayla Kent a 83 y.o.femalewith medical history significant forstage III chronic kidney disease, dyslipidemia, chronic atrial fibrillation on Eliquis, hypertension, seizure disorder on AEDs, and recent FNA of thyroid nodule. Initial admit for upper gi bleed but also having progress oxygen needs found to have multifocal pneumonia thought to be due to aspiration pneumonia in the setting of newaphasia and right hemiplegia yesterday requiringmedicalthrombectomy. MRI Brain 11/27 shows acute ischemic infarct within the left hemisphere, predominantly located within the left precentral gyrus and frontal operculum, also involving the left postcentral gyrus. Pt intubated for thrombectomy on 11/25 - extubated on 11/27.  No Data Recorded Assessment / Plan / Recommendation CHL IP CLINICAL IMPRESSIONS 09/14/2017 Clinical Impression Pt presents with moderate-severe oropharyngeal dysphagia marked by oral holding, delayed swallow initiation to the pyriforms, moderate residue post swallow and aspiration during the swallow with thin and honey-thick liquid consistencies. Pt required MAX cueing and sensory feedback (large boluses/spoon bolus further back) for swallow initiation. Aspirate sensed by patient; however, pt unable to effectively cough to clear aspirate from airway. Puree consistency transited without penetration/aspiration however, resulted in moderate pharyngeal residue. Pt unable to clear residue with multiple swallows; however, chin tuck decreases residue. Pt's dysphagia secondary to neuromuscular impairment following stroke, delayed timing of swallow initiation and possibly recent extubation.  Recommend pt remain NPO until medically ready to repeat objective swallow study. Will continue with SLP intervention targeting PO trials of purees to further readiness for objective study.  SLP Visit Diagnosis Frontal lobe and executive function deficit Attention and concentration deficit following -- Frontal lobe and executive function deficit following Cerebral infarction Impact on safety and function Severe aspiration risk   CHL IP TREATMENT RECOMMENDATION 09/14/2017 Treatment Recommendations Therapy as outlined in treatment plan below   Prognosis 09/14/2017 Prognosis for Safe Diet Advancement Fair Barriers to Reach Goals Cognitive deficits;Severity of deficits;Time post onset Barriers/Prognosis Comment -- CHL IP DIET RECOMMENDATION 09/14/2017 SLP Diet Recommendations NPO Liquid Administration via -- Medication Administration -- Compensations -- Postural Changes --   CHL IP OTHER RECOMMENDATIONS 09/14/2017 Recommended Consults -- Oral Care Recommendations Oral care QID Other Recommendations --   CHL IP FOLLOW UP RECOMMENDATIONS 09/14/2017 Follow up Recommendations Skilled Nursing facility   [facility] IP FREQUENCY AND DURATION 09/14/2017 Speech Therapy Frequency (ACUTE ONLY) min 2x/week Treatment Duration 2 weeks      CHL IP ORAL PHASE 09/14/2017 Oral Phase Impaired Oral - Pudding Teaspoon -- Oral - Pudding Cup -- Oral - Honey Teaspoon Holding of bolus;Delayed oral transit;Premature spillage Oral - Honey Cup Holding of bolus;Delayed oral transit;Premature spillage Oral - Nectar Teaspoon -- Oral - Nectar Cup -- Oral - Nectar Straw -- Oral - Thin Teaspoon -- Oral - Thin Cup Delayed oral transit;Premature spillage Oral - Thin Straw -- Oral - Puree Delayed oral transit;Premature spillage Oral - Mech Soft -- Oral - Regular -- Oral - Multi-Consistency -- Oral - Pill -- Oral Phase - Comment --  CHL IP PHARYNGEAL PHASE  09/14/2017 Pharyngeal Phase Impaired Pharyngeal- Pudding Teaspoon -- Pharyngeal -- Pharyngeal- Pudding Cup --  Pharyngeal -- Pharyngeal- Honey Teaspoon Moderate aspiration;Delayed swallow initiation-pyriform sinuses;Reduced pharyngeal peristalsis;Reduced airway/laryngeal closure;Reduced tongue base retraction;Penetration/Aspiration during swallow;Pharyngeal residue - valleculae;Pharyngeal residue - pyriform;Pharyngeal residue - posterior pharnyx Pharyngeal Material enters airway, passes BELOW cords and not ejected out despite cough attempt by patient Pharyngeal- Honey Cup -- Pharyngeal -- Pharyngeal- Nectar Teaspoon -- Pharyngeal -- Pharyngeal- Nectar Cup -- Pharyngeal -- Pharyngeal- Nectar Straw -- Pharyngeal -- Pharyngeal- Thin Teaspoon -- Pharyngeal -- Pharyngeal- Thin Cup Moderate aspiration;Delayed swallow initiation-pyriform sinuses;Reduced pharyngeal peristalsis;Reduced airway/laryngeal closure;Reduced tongue base retraction;Penetration/Aspiration during swallow;Pharyngeal residue - valleculae;Pharyngeal residue - pyriform;Pharyngeal residue - posterior pharnyx Pharyngeal Material enters airway, passes BELOW cords and not ejected out despite cough attempt by patient Pharyngeal- Thin Straw -- Pharyngeal -- Pharyngeal- Puree Delayed swallow initiation-pyriform sinuses;Reduced pharyngeal peristalsis;Reduced airway/laryngeal closure;Reduced tongue base retraction;Pharyngeal residue - valleculae;Pharyngeal residue - pyriform;Pharyngeal residue - posterior pharnyx Pharyngeal Material does not enter airway Pharyngeal- Mechanical Soft -- Pharyngeal -- Pharyngeal- Regular -- Pharyngeal -- Pharyngeal- Multi-consistency -- Pharyngeal -- Pharyngeal- Pill -- Pharyngeal -- Pharyngeal Comment --  CHL IP CERVICAL ESOPHAGEAL PHASE 09/14/2017 Cervical Esophageal Phase WFL Pudding Teaspoon -- Pudding Cup -- Honey Teaspoon -- Honey Cup -- Nectar Teaspoon -- Nectar Cup -- Nectar Straw -- Thin Teaspoon -- Thin Cup -- Thin Straw -- Puree -- Mechanical Soft -- Regular -- Multi-consistency -- Pill -- Cervical Esophageal Comment -- No  flowsheet data found. Supervised and reviewed by [facility] MA CCC-SLP DeBlois, Katherene Ponto 09/14/2017, 11:51 AM              Ir Percutaneous Art Thrombectomy/infusion Intracranial Inc Diag Angio  Result Date: 09/13/2017 INDICATION: Acute onset of right-sided paralysis, global aphasia and gaze deviation. NIH score of 24. EXAM: 1. EMERGENT LARGE VESSEL OCCLUSION THROMBOLYSIS (anterior CIRCULATION) COMPARISON:  CT scan of the brain of 09/11/2017. MEDICATIONS: Vancomycin 1 g IV antibiotic was administered within 1 hour of the procedure. ANESTHESIA/SEDATION: General anesthesia. CONTRAST:  Isovue 300 approximately 100 mL. FLUOROSCOPY TIME:  Fluoroscopy Time: 40 minutes 18 seconds (2091 mGy). COMPLICATIONS: None immediate. TECHNIQUE: Following a full explanation of the procedure along with the potential associated complications, an informed witnessed consent was obtained from the patient's daughter. The risks of intracranial hemorrhage of 10%, worsening neurological deficit, ventilator dependency, death and inability to revascularize were all reviewed in detail with the patient's daughter. The patient was then put under general anesthesia by the Department of Anesthesiology at [facility]. The right groin was prepped and draped in the usual sterile fashion. Thereafter using modified Seldinger technique, transfemoral access into the right common femoral artery was obtained without difficulty. Over a 0.035 inch guidewire a 5 French Pinnacle sheath was inserted. Through this, and also over a 0.035 inch guidewire a 5 [place] JB 1 catheter was advanced to the aortic arch region and selectively positioned in the in the left common carotid artery. FINDINGS: The left common carotid arteriogram demonstrates the left external carotid artery and its major branches to be widely patent. The left internal carotid artery at the bulb demonstrates a smooth plaque along the posterior wall without associated significant  stenosis by the NASCET criteria. Just distal to the bulb and also along the posterior wall there is a smooth lucency which probably represents an atherosclerotic smooth plaque. Distal to this the left internal carotid artery is seen to opacify to the cranial skull base. There is a focal segment of mild stenosis at the distal  cervical segment. Distal to this the petrous, cavernous and supraclinoid segments demonstrate wide patency. The left middle cerebral artery has approximately 50% stenosis in the left M1 segment. The trifurcation branches demonstrate patency with angiographic occlusion of the distal inferior division of the left middle cerebral artery. The delayed capillary and the arterial phase demonstrated a prominent area of hypoperfusion involving primarily the posterior 2/3 of the peri-insular cortical and subcortical regions. Transient filling via the anterior communicating artery of the right anterior cerebral artery A2 segment is seen. The right anterior cerebral artery is seen to opacify normally into the capillary and venous phases. PROCEDURE: ENDOVASCULAR COMPLETE REVASCULARIZATION OF THE OCCLUDED LEFT MIDDLE CEREBRAL ARTERY INFERIOR DIVISION DISTALLY WITH 2 PASSES WITH THE SOLITAIRE FR 4 MM X 40 MM RETRIEVAL DEVICE, AND USE OF 4.5 MG OF SUPER SELECTIVE INTRACRANIAL INTEGRELIN The diagnostic JB 1 catheter in the left common carotid artery was exchanged for a 0.035 inch 300 cm Rosen exchange guidewire for an 8 French 55 cm Brite tip neurovascular sheath using biplane roadmap technique and constant fluoroscopic guidance. Good aspiration was obtained from the side port of the hub of the neurovascular sheath. This was then connected to continuous heparinized saline infusion. Over the [employer] guidewire, an 8 [place] 85 cm FlowGate balloon guide catheter which had been prepped with 50% contrast and 50% heparinized saline infusion was then advanced and positioned just proximal to the left common  carotid bifurcation. The Rosen exchange guidewire was then removed. Good aspiration obtained from the hub of the of FlowGate balloon guide catheter. Over a 0.035 inch Roadrunner guidewire, using biplane roadmap technique and constant fluoroscopic guidance, the FlowGate guide catheter was then advanced to the distal cervical segment of the left internal carotid artery. The guidewire was removed. Good aspiration obtained from the hub of the [facility] guide catheter. A gentle contrast injection demonstrated brisk antegrade flow of contrast into the intracranial circulation without any changes. A combination of a Trevo ProVue microcatheter inside of a 115 cm 5 [place] Catalyst guide catheter was then advanced over a 0.014 inch Softip Synchro micro guidewire to the distal end of the [facility] guide catheter. With the micro guidewire leading with a J-tip configuration, the combination was then navigated without difficulty to the supraclinoid left ICA. Using a torque device, the micro guidewire was then gently advanced through the narrowed left middle cerebral artery into the occluded distal aspect of the inferior division of the left middle cerebral artery followed by the microcatheter. The guidewire was removed. There was resistance to the aspiration of blood at the hub of the microcatheter. The microcatheter tip was advanced gently retrieved until there was free aspiration of blood. A very gentle contrast injection demonstrated slow antegrade ascent of contrast distally. The 5 [place] Catalyst guide catheter was then advanced into the origin of the left middle cerebral artery. A 4 mm x 40 mm Solitaire retrieval device was then advanced to the distal end of the microcatheter. The O ring on the delivery microcatheter was then loosened. After having ascertained the placement of the retrieval device, with slight forward gentle traction with the right hand on the delivery micro guidewire, with the left hand the delivery  microcatheter was then retrieved unsheathing the retrieval device. A control arteriogram performed through the 5 [place] Catalyst guide catheter demonstrated slow flow through the now patent previously occluded inferior division distally. The balloon of the [facility] guide catheter was then inflated in the left internal carotid artery distally for proximal flow arrest. The  proximal portion of the retrieval device was then captured. Thereafter constant aspiration was applied with a 60 mL syringe at the hub of the [facility] guide catheter and also at the hub of the 5 Teller guide catheter as the combination of the retrieval device, the microcatheter, the guide catheter was gently retrieved and removed. Aspiration was continued as the balloon was then deflated. There was no evidence of clot in the interstices of the retrieval device, or the aspirate. After having ascertained free back bleed at the hub of the 8 [place] FlowGate guide catheter a control arteriogram performed through the [facility] guide catheter in the left internal carotid artery demonstrated no change in the occluded left middle cerebral artery inferior division distally. A second pass was then made again as described above. A combination of the microcatheter inside of a Catalyst guide catheter and a 5 French 115 cm was advanced over a 0.014 inch Softip Synchro micro guidewire as described above with the microcatheter being again positioned distal to the known site of clot. Free aspiration of blood was obtained at the hub of the microcatheter prior to the placement of a 4 mm x 40 mm Solitaire FR retrieval device. Again the device was positioned and then deployed with slight forward gentle traction with the right hand on the delivery micro guidewire with the left hand the delivery microcatheter was retrieved. The Catalyst guide catheter was then advanced into the proximal left M1 segment. Thereafter after having captured the proximal portion of the  retrieval device into the microcatheter, with proximal flow arrest in the left internal carotid artery, the combination of the retrieval device, the microcatheter, and the Catalyst guide catheter was retrieved and removed as constant aspiration was applied with a 60 mL syringe at the hub of the [facility] guide catheter. Again aspiration was continued as the balloon was deflated in the left internal carotid artery to reverse flow arrest. Again back bleed was noted at the hub of the [facility] guide catheter. No significant clot was noted in the aspirate, or on the retrieval device. Arteriogram performed through the 8 [place] FlowGate guide catheter in the left internal carotid artery demonstrated complete revascularization of the previously noted occluded inferior division of the left middle cerebral artery. Spasm was noted within the re-vascularized vessel, and also the left M1 segment. This resolved with 3 aliquots of 25 mics of nitroglycerin. Also at this time, approximately 4.5 mg of intra-arterial Integrilin was given. A control arteriogram performed at 10 minutes following this demonstrated complete resolution of the previously noted vasospasm with now brisk flow through the re-vascularized left middle cerebral artery inferior division distally. The remaining branches remained patent as did the anterior cerebral artery distribution. Throughout the procedure, the patient's blood pressure, neurological status and hemodynamic status remained stable. No evidence of extravasation of contrast, or mass-effect on the major vessel intracranially was noted. The 8 [place] FlowGate guide catheter and the neurovascular sheath were then retrieved into the abdominal aorta and exchanged for an 8 French Pinnacle sheath over a J-tip guidewire. A control arteriogram performed through the sheath demonstrated the puncture site for placement of an external closure device. An 8 [place] Angio-Seal was then positioned for hemostasis without  difficulty. The patient's distal pulses remained Dopplerable in the dorsalis pedis and the posterior tibial arteries bilaterally unchanged from prior to the examination. IMPRESSION: Status post endovascular complete revascularization of occluded inferior division of the left middle cerebral artery distally with 2 passes with the Solitaire FR 4 mm x  40 mm retrieval device, and 4.5 mg of super selective intracranial Integrelin achieving a TICI 3 reperfusion. PLAN: Patient transported to the CT scan suite for postprocedural CT scan of the brain. Electronically Signed   By: Luanne Bras M.D.   On: 09/12/2017 18:29   Ct Head Code Stroke Wo Contrast  Result Date: 09/11/2017 CLINICAL DATA:  Code stroke.  Right-sided weakness. EXAM: CT HEAD WITHOUT CONTRAST TECHNIQUE: Contiguous axial images were obtained from the base of the skull through the vertex without intravenous contrast. COMPARISON:  09/06/2017 CT of the head. FINDINGS: Brain: No evidence of acute infarction, hemorrhage, hydrocephalus, extra-axial collection or mass lesion/mass effect. Right anterior temporal lobe chronic encephalomalacia is unchanged. Foci of hypoattenuation predominantly in periventricular white matter is stable and compatible with mild chronic microvascular ischemic changes. Mild diffuse brain parenchymal volume loss is stable. Vascular: Calcific atherosclerosis of carotid siphons. Skull: Normal. Negative for fracture or focal lesion. Sinuses/Orbits: No acute finding. Other: None. ASPECTS [facility] Stroke Program Early CT Score) - Ganglionic level infarction (caudate, lentiform nuclei, internal capsule, insula, M1-M3 cortex): 7 - Supraganglionic infarction (M4-M6 cortex): 3 Total score (0-10 with 10 being normal): 10 IMPRESSION: 1. No acute intracranial abnormality identified. 2. Stable right anterior temporal lobe encephalomalacia, chronic microvascular ischemic changes, and parenchymal volume loss of the brain. 3. ASPECTS is 10 These  results were called by telephone at the time of interpretation on 09/11/2017 at 8:04 pm to Dr. Lorraine Lax who verbally acknowledged these results. Electronically Signed   By: Kristine Garbe M.D.   On: 09/11/2017 20:04    Microbiology: Recent Results (from the past 240 hour(s))  Culture, blood (Routine X 2) w Reflex to ID Panel     Status: None   Collection Time: 09/19/17  7:35 AM  Result Value Ref Range Status   Specimen Description BLOOD LEFT HAND  Final   Special Requests IN PEDIATRIC BOTTLE Blood Culture adequate volume  Final   Culture NO GROWTH 5 DAYS  Final   Report Status 09/24/2017 FINAL  Final  Culture, blood (Routine X 2) w Reflex to ID Panel     Status: None   Collection Time: 09/19/17  7:40 AM  Result Value Ref Range Status   Specimen Description BLOOD LEFT HAND  Final   Special Requests IN PEDIATRIC BOTTLE Blood Culture adequate volume  Final   Culture NO GROWTH 5 DAYS  Final   Report Status 09/24/2017 FINAL  Final  Respiratory Panel by PCR     Status: None   Collection Time: 09/19/17  2:17 PM  Result Value Ref Range Status   Adenovirus NOT DETECTED NOT DETECTED Final   Coronavirus 229E NOT DETECTED NOT DETECTED Final   Coronavirus HKU1 NOT DETECTED NOT DETECTED Final   Coronavirus NL63 NOT DETECTED NOT DETECTED Final   Coronavirus OC43 NOT DETECTED NOT DETECTED Final   Metapneumovirus NOT DETECTED NOT DETECTED Final   Rhinovirus / Enterovirus NOT DETECTED NOT DETECTED Final   Influenza A NOT DETECTED NOT DETECTED Final   Influenza B NOT DETECTED NOT DETECTED Final   Parainfluenza Virus 1 NOT DETECTED NOT DETECTED Final   Parainfluenza Virus 2 NOT DETECTED NOT DETECTED Final   Parainfluenza Virus 3 NOT DETECTED NOT DETECTED Final   Parainfluenza Virus 4 NOT DETECTED NOT DETECTED Final   Respiratory Syncytial Virus NOT DETECTED NOT DETECTED Final   Bordetella pertussis NOT DETECTED NOT DETECTED Final   Chlamydophila pneumoniae NOT DETECTED NOT DETECTED Final    Mycoplasma pneumoniae NOT DETECTED NOT DETECTED  Final  Culture, respiratory (NON-Expectorated)     Status: None   Collection Time: 09/19/17  3:43 PM  Result Value Ref Range Status   Specimen Description TRACHEAL ASPIRATE  Final   Special Requests NONE  Final   Gram Stain   Final    RARE WBC PRESENT, PREDOMINANTLY PMN RARE YEAST RARE GRAM POSITIVE COCCI IN PAIRS RARE SQUAMOUS EPITHELIAL CELLS PRESENT    Culture Consistent with normal respiratory flora.  Final   Report Status 09/26/2017 FINAL  Final  Culture, Urine     Status: None   Collection Time: 09/19/17  5:21 PM  Result Value Ref Range Status   Specimen Description URINE, RANDOM  Final   Special Requests NONE  Final   Culture NO GROWTH  Final   Report Status 09/20/2017 FINAL  Final     Labs: Basic Metabolic Panel: Recent Labs  Lab 09/24/17 0244 09/25/17 0407 09/26/17 0402 09/27/17 0632 09/28/17 0441  NA 137 138 137 138 137  K 4.5 4.7 4.1 3.9 3.4*  CL 100* 102 104 107 103  CO2 28 27 25 26 26   GLUCOSE 113* 100* 84 90 118*  BUN 15 15 13 11 12   CREATININE 0.64 0.67 0.62 0.63 0.65  CALCIUM 8.3* 8.6* 8.4* 8.1* 8.2*  MG 2.0 2.0 2.0 1.8 1.8  PHOS 4.5 4.8* 4.7* 4.1 3.0   Liver Function Tests: Recent Labs  Lab 09/24/17 0244 09/25/17 0407 09/26/17 0402 09/27/17 0632 09/28/17 0441  AST 43* 35 25 19 22   ALT 49 46 39 27 26  ALKPHOS 86 78 70 61 78  BILITOT 0.3 0.5 0.6 0.4 0.5  PROT 5.6* 5.3* 5.5* 4.8* 5.5*  ALBUMIN 2.2* 2.3* 2.2* 2.0* 2.2*   No results for input(s): LIPASE, AMYLASE in the last 168 hours. No results for input(s): AMMONIA in the last 168 hours. CBC: Recent Labs  Lab 09/24/17 0244 09/25/17 0407 09/26/17 0402 09/27/17 7972 09/28/17 0441  WBC 15.0* 17.4* 10.0 7.7 9.2  NEUTROABS 12.4* 14.5* 7.5 5.3 7.2  HGB 9.9* 9.7* 10.0* 9.1* 10.0*  HCT 32.0* 31.2* 31.9* 29.2* 32.3*  MCV 96.4 96.0 96.4 98.0 97.6  PLT 402* 420* 406* 366 395   Cardiac Enzymes: No results for input(s): CKTOTAL, CKMB,  CKMBINDEX, TROPONINI in the last 168 hours. BNP: BNP (last 3 results) Recent Labs    09/06/17 1649 09/19/17 0743  BNP 722.5* 637.1*    ProBNP (last 3 results) Recent Labs    10/22/16 1135  PROBNP 410.0*    CBG: Recent Labs  Lab 09/27/17 2331 09/28/17 0334 09/28/17 0731 09/28/17 1152 09/28/17 1556  GLUCAP 118* 117* 125* 117* 104*       Signed:  Domenic Polite MD.  [facility] 09/28/2017, 5:00 PM

## 2017-09-28 NOTE — Progress Notes (Signed)
SLP Cancellation Note  Patient Details Name: Kayla Kent MRN: [id number] DOB: 1932/08/08   Cancelled treatment:       Reason Eval/Treat Not Completed: Other (comment) Pt currently working with PT/OT. Will f/u with her at a later time.   Germain Osgood 09/28/2017, 10:47 AM  Germain Osgood, M.A. CCC-SLP [phone]

## 2017-09-28 NOTE — Progress Notes (Signed)
  Speech Language Pathology Treatment: Dysphagia  Patient Details Name: Kayla Kent MRN: [id number] DOB: 08-11-1932 Today's Date: 09/28/2017 Time: 8590-9311 SLP Time Calculation (min) (ACUTE ONLY): 11 min  Assessment / Plan / Recommendation Clinical Impression  Treatment today focused primarily on swallowing goals. SLP provided Mod cues for use of chin tuck and second swallow to reduce risk of aspiration with pureed boluses. Pt continues to need Mod A for volitional coughing and use of oral suction to clear vocal quality. Pt swallowed x1 to command despite Max cues. She remains a good candidate for CIR to try to maximize her swallowing safety and communication abilities.   HPI HPI: Pt is an 83 y.o.femalewith medical history significant for stage III chronic kidney disease, dyslipidemia, chronic atrial fibrillation previously on Eliquis, hypertension, seizure disorder on Keppra, and recent outpatient fine-needle aspiration of thyroid nodule. 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 She was intubated 11/25-11/27. MBS completed 11/28 showed a mdoerate-severe oropharyngeal dysphagia with delays in swallow initiation adn pharyngeal weakness. She was recommended to remain NPO  with therapeutic trials of puree. Pt went to CIR on 11/30 where she continued to work on swallow initiation but was readmitted to the hospital on 12/3 with suspected PNA.      SLP Plan  Continue with current plan of care       Recommendations  Diet recommendations: NPO Medication Administration: Via alternative means                Oral Care Recommendations: Oral care QID Follow up Recommendations: Inpatient Rehab SLP Visit Diagnosis: Dysphagia, oropharyngeal phase (R13.12) Plan: Continue with current plan of care       GO                Germain Osgood 09/28/2017, 12:18 PM  Germain Osgood, M.A. CCC-SLP [phone]

## 2017-09-28 NOTE — Progress Notes (Signed)
Pt seen and assessed shortly afterr arrival to [facility]M.  Pt unable to maintain adequate seal to utilize Acapella/Fluuter at this time.  Continue attempts.  NTS not indicated at this time.  Cough weak, NP, breath sounds decreased overall.  Scheduled and PRN neb treatments order noted.
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
## 2017-09-28 NOTE — Telephone Encounter (Signed)
Copied from [facility] #[id number]. Topic: General - Other >> Sep 28, 2017 10:33 AM Darl Householder, RMA wrote: Reason for CRM: Patient's Son Tabathia Knoche is requesting a call back at  [phone]. Concerning patient she has been in hospital x2 weeks

## 2017-09-28 NOTE — H&P (Signed)
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 used beginning in 24 hours after placement. Electronically Signed   By: Aletta Edouard M.D.   On: 09/26/2017 12:10   Dg Chest Port 1 View  Result Date: 09/28/2017 CLINICAL DATA:  Followup shortness of breath and pneumonia. EXAM: PORTABLE CHEST 1 VIEW COMPARISON:  09/25/2017 FINDINGS: Chronic cardiomegaly and aortic atherosclerosis. Chronic markings persist in the left lung. Right perihilar pneumonia for cysts, similar in the suprahilar region and possibly worsened in the lower lung. No developing effusion of significance. IMPRESSION: Persistent pneumonia in the right suprahilar region. Worsening of pneumonia in the right infrahilar region. Electronically Signed   By: Nelson Chimes M.D.   On: 09/28/2017 07:42    Medical Problem List and Plan: 1.  Hemiparesis, aphasia, dysphagia secondary to Left MCA infarct. 2.  DVT Prophylaxis/Anticoagulation: Pharmaceutical: Other (comment)--Eliquis 3. Pain Management: Tylenol  prn  4. Mood: LCSW to follow for evaluation and support.  5. Neuropsych: This patient is not fully capable of making decisions on her own behalf. 6. Skin/Wound Care: Pressure relief measures.Maintain adequate hydration/nutritional status.  7. Fluids/Electrolytes/Nutrition: Adjust tube feeds to maintain adequate nutritional status. Consult dietitian for input and adjustment.  8. RUL/RLL aspiration PNA with leucocytosis: WBC improving and antibiotics were narrowed today with recommendations of  Antibiotics Day # 10/12.  CXR without improvement--monitor clinically. May need to stop oral trials--but patient wants to eat. Will check F/U 2 view films on Friday.  9. CKD stage III: Monitor renal status with routine checks. Water flushes via PEG.  10 Recent GIB: H/H stable. Back on Eliquis.  11. Seizure disorder: stable/controlled on Keppra. 12. A Fib: Monitor HR bid. Continue metoprolol and decrease ASA to 81 mg/day.  13. Thrombocytosis: Likely reactive. Will continue to monitor.  14. Dysphagia: NPO (bite trials with ST ongoing) and tolerating tube feeds-- 15. Protein calorie malnutrition: Chronic with baseline albumin around 2.5 in the past 6 months. Will check prealbumin.  16. COPD (per X ray)/RUL bronchogenic lung CA: completed XRT 06/2017 17. Hypokalemia: Supplement today and recheck in am.   Post Admission Physician Evaluation: 1. Preadmission assessment reviewed and changes made below. 2. Functional deficits secondary  to left MCA infarct. 3. Patient is admitted to receive collaborative, interdisciplinary care between the physiatrist, rehab nursing staff, and therapy team. 4. Patient's level of medical complexity and substantial therapy needs in context of that medical necessity cannot be provided at a lesser intensity of  care such as a SNF. 5. Patient has experienced substantial functional loss from his/her baseline which was documented above under the "Functional History" and "Functional Status"  headings.  Judging by the patient's diagnosis, physical exam, and functional history, the patient has potential for functional progress which will result in measurable gains while on inpatient rehab.  These gains will be of substantial and practical use upon discharge  in facilitating mobility and self-care at the household level. 58. Physiatrist will provide 24 hour management of medical needs as well as oversight of the therapy plan/treatment and provide guidance as appropriate regarding the interaction of the two. 7. 24 hour rehab nursing will assist with bladder management, bowel management, safety, skin/wound care, disease management, medication administration and patient education  and help integrate therapy concepts, techniques,education, etc. 8. PT will assess and treat for/with: Lower extremity strength, range of motion, stamina, balance, functional mobility, safety, adaptive techniques and equipment, coping skills, pain control, stroke education.   Goals are: Min/Mod A. 9. OT will assess and treat for/with: ADL's, functional mobility, safety, upper extremity strength, adaptive techniques and equipment, ego support, and community reintegration.   Goals are: Min/Mod A. Therapy may not proceed with showering this patient. 10. SLP will assess and treat for/with: dysphagia, speech, language, cognition.  Goals are: Min A/Supervision. 11. Case Management and Social Worker will assess and treat for psychological issues and discharge planning. 12. Team conference will be held weekly to assess progress toward goals and to determine barriers to discharge. 13. Patient will receive at least 3 hours of therapy per day at least 5 days per week. 14. ELOS: 25-30 days.       15. Prognosis:  good  Delice Lesch, MD, ABPMR Bary Leriche, [place] 09/28/2017

## 2017-09-28 NOTE — Telephone Encounter (Signed)
Contacted Patient Relationship Supervisor to relay concerns to [employer] and requested patient's son be contacted.

## 2017-09-28 NOTE — Progress Notes (Signed)
Occupational Therapy Treatment Patient Details Name: Kayla Kent MRN: [id number] DOB: [birth year] Today's Date: 09/28/2017    History of present illness pt is an 83 y/o female with pmh significant for CKD3, chronic afib on Eliquis, HT, seizure d/o, recent L MCA CVA with aphasia and R hemiparesis, R UE densely hemiplegic, readmitted from CIR with fever and suspected pneumonia.   OT comments  Pt progressing towards OT goals this session. Pt is able to move her RUE more on command this session, but still struggles with following directions correctly. Pt gaze remains disconjugate and continues to push to the right in sitting and standing. Pt continues to require CIR level therapy to maximize safety and independence in ADL.   Follow Up Recommendations  Supervision/Assistance - 24 hour;CIR    Equipment Recommendations  Other (comment)(defer to next venue)    Recommendations for Other Services Rehab consult    Precautions / Restrictions Precautions Precautions: Fall Precaution Comments: R hemiplegia Restrictions Weight Bearing Restrictions: No       Mobility Bed Mobility Overal bed mobility: Needs Assistance Bed Mobility: Rolling;Supine to Sit;Sit to Supine Rolling: Min assist Sidelying to sit: Mod assist Supine to sit: Mod assist     General bed mobility comments: pt need assist to come forward, then assisted minimally to scoot to EOB  Transfers Overall transfer level: Needs assistance Equipment used: 2 person hand held assist Transfers: Sit to/from Stand Sit to Stand: Max assist         General transfer comment: Needed assist to power up and was slightly flexed as well as heavy posterior and right lateral lean.   Pt purewick catheter had leaked therefore had to cleanpt  up and bed changed.  once clean, pt was able to ambulate with +2 assist.     Balance Overall balance assessment: Needs assistance Sitting-balance support: No upper extremity supported Sitting  balance-Leahy Scale: Fair Sitting balance - Comments: Initially max assist and progressed to min guard assist to sit EOB.  Postural control: Posterior lean;Right lateral lean Standing balance support: During functional activity;Single extremity supported;Bilateral upper extremity supported Standing balance-Leahy Scale: Poor Standing balance comment: kyphotic posture with mod assist for standing balance with flexed posture as well as posterior and right lateral lean                           ADL either performed or assessed with clinical judgement   ADL Overall ADL's : Needs assistance/impaired     Grooming: Wash/dry face;Set up;Sitting Grooming Details (indicate cue type and reason): with left hand                 Toilet Transfer: Maximal assistance;+2 for safety/equipment Toilet Transfer Details (indicate cue type and reason): sit to stand, 2 person HHA - watch lean to right Toileting- Clothing Manipulation and Hygiene: Moderate assistance;+2 for safety/equipment;Sit to/from stand Toileting - Clothing Manipulation Details (indicate cue type and reason): Pt able to perform with left hand in standing with support from 2 therapists for safety and balance     Functional mobility during ADLs: Maximal assistance;+2 for safety/equipment;Cueing for safety General ADL Comments: Pt with visual impairments impacting depth perception and coordination; Pt still continues to struggle with command following - especially with right side     Vision       Perception     Praxis      Cognition Arousal/Alertness: Awake/alert Behavior During Therapy: Flat affect Overall Cognitive Status: Difficult to assess(aphasia)  Area of Impairment: Attention;Orientation;Following commands;Safety/judgement;Problem solving;Awareness;Memory                 Orientation Level: Time Current Attention Level: Sustained   Following Commands: Follows one step commands with increased  time Safety/Judgement: Decreased awareness of deficits;Decreased awareness of safety Awareness: Intellectual Problem Solving: Decreased initiation;Slow processing General Comments: Pt responds yes to several questions.  Rhunette Croft to PT/OT        Exercises Exercises: General Lower Extremity General Exercises - Lower Extremity Ankle Circles/Pumps: AROM;Both;5 reps;Seated Long Arc Quad: AROM;Both;10 reps;Seated Hip Flexion/Marching: AROM;Both;5 reps;Seated   Shoulder Instructions       General Comments VSS    Pertinent Vitals/ Pain       Pain Assessment: Faces Faces Pain Scale: No hurt Pain Intervention(s): Monitored during session  Home Living   Living Arrangements: Alone Available Help at Discharge: Family Type of Home: House Home Access: Stairs to enter Technical brewer of Steps: 1 Entrance Stairs-Rails: None Home Layout: One level     Bathroom Shower/Tub: Occupational psychologist: Handicapped height Bathroom Accessibility: Yes   Home Equipment: Grab bars - tub/shower;Shower seat;Walker - 2 wheels   Additional Comments: per chart review; also Pt  Lives With: Alone    Prior Functioning/Environment Level of Independence: Independent        Comments: PTA independent with ADL and home management. Does not use RW per her report.    Frequency  Min 2X/week        Progress Toward Goals  OT Goals(current goals can now be found in the care plan section)  Progress towards OT goals: Progressing toward goals  Acute Rehab OT Goals Patient Stated Goal: get to rehab OT Goal Formulation: With patient Time For Goal Achievement: 09/28/17 Potential to Achieve Goals: Good  Plan Discharge plan remains appropriate;Frequency remains appropriate    Co-evaluation    PT/OT/SLP Co-Evaluation/Treatment: Yes Reason for Co-Treatment: Complexity of the patient's impairments (multi-system involvement);For patient/therapist safety PT goals addressed during  session: Mobility/safety with mobility OT goals addressed during session: ADL's and self-care      AM-PAC PT "6 Clicks" Daily Activity     Outcome Measure   Help from another person eating meals?: Total(NPO) Help from another person taking care of personal grooming?: A Lot Help from another person toileting, which includes using toliet, bedpan, or urinal?: A Lot Help from another person bathing (including washing, rinsing, drying)?: A Lot Help from another person to put on and taking off regular upper body clothing?: A Lot Help from another person to put on and taking off regular lower body clothing?: Total 6 Click Score: 10    End of Session Equipment Utilized During Treatment: Gait belt  OT Visit Diagnosis: Other abnormalities of gait and mobility (R26.89);Muscle weakness (generalized) (M62.81);Other symptoms and signs involving cognitive function;Apraxia (R48.2);Feeding difficulties (R63.3);Hemiplegia and hemiparesis Hemiplegia - Right/Left: Right Hemiplegia - dominant/non-dominant: Dominant Hemiplegia - caused by: Cerebral infarction   Activity Tolerance Patient tolerated treatment well   Patient Left in chair;with call bell/phone within reach;with chair alarm set   Nurse Communication Mobility status;Other (comment)(purewick)        Time: 8185-6314 OT Time Calculation (min): 26 min  Charges: OT General Charges $OT Visit: 1 Visit OT Treatments $Self Care/Home Management : 8-22 mins  Hulda Humphrey OTR/L [place] 09/28/2017, 3:03 PM

## 2017-09-28 NOTE — Progress Notes (Signed)
Rehab admissions - Patient is medically ready for inpatient rehab today per attending MD.  Patient is agreeable to CIR today.  Bed available and will admit to inpatient rehab today.  Call me for questions.  #[phone]

## 2017-09-28 NOTE — Progress Notes (Signed)
Patient information reviewed and entered into eRehab system by Spero Gunnels, RN, CRRN, PPS Coordinator.  Information including medical coding and functional independence measure will be reviewed and updated through discharge.     Per nursing patient was given "Data Collection Information Summary for Patients in Inpatient Rehabilitation Facilities with attached "Privacy Act Statement-Health Care Records" upon admission.  

## 2017-09-28 NOTE — Plan of Care (Signed)
No acute events at this time. Pt and family have expressed desire for pt to return to CIR to continue rehab efforts as soon as medically cleared.

## 2017-09-28 NOTE — Consult Note (Signed)
   [facility] CM Inpatient Consult   09/28/2017  Kayla Kent [birth year] [id number]    [facility] Care Management follow up.  Chart reviewed. Kayla Kent was transferred to [facility].   Will follow up with Kayla Kent while on CIR unit for [place] Management services.   Marthenia Rolling, [name], RN,BSN [facility] Liaison [phone]

## 2017-09-28 NOTE — Telephone Encounter (Signed)
Spoke with the pt's son and he is concerned that the patient will not make it out of the [facility]. He stated that it is not a clear plan of care, and that the physicians at the hospital are not communicating together and have no definite answers or path that the hospital.   She originally went in with a complaint of urosepsis but she started having n rapid a-fib, lung tissue dx that turned into pneumonia, doctors stopped her Eliquis that lead her to developed clot and have a stroke, and just put a feeding tube in yesterday.   Informed pt's son Monica Martinez that I would talked to Dr. Jenny Reichmann and the office manager so Almyra Free could help get them in contact with the correct higher ups at [facility] to handle this situation.

## 2017-09-28 NOTE — Progress Notes (Signed)
Pt and daughter notified and agree to transfer to 638M. Report given to 638M nurse. Pt transferred in bed by RN and Nurse Tech and daughter at bedside.

## 2017-09-28 NOTE — Consult Note (Signed)
[facility] Consult   Reason for Consult:  Concern for depression Referring Physician:  Dr. Alfredia Ferguson Patient Identification: Kayla Kent MRN:  [id number] Principal Diagnosis: Depression Diagnosis:   Patient Active Problem List   Diagnosis Date Noted  . 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 Aspiration pneumonia of right lower lobe ([place]) [J69.0]   . Stage 3 chronic kidney disease ([place]) [N18.3]   . Thrombocytosis ([place]) [D47.3]   . PAF (paroxysmal atrial fibrillation) ([place]) [I48.0]   . Severe protein-calorie malnutrition ([place]) [E43]   . Hypokalemia [E87.6]   . Acute respiratory failure with hypoxia ([place]) [J96.01] 09/19/2017  . Dysphagia [R13.10] 09/19/2017  . History of GI bleed [Z87.19]   . Acute ischemic left MCA stroke ([place]) [I63.512] 09/16/2017  . Cerebral thrombosis with cerebral infarction [I63.30] 09/13/2017  . Fever [R50.9]   . Aspiration pneumonia due to gastric secretions ([place]) [J69.0]   . Leukocytosis [D72.829]   . Acute hypoxemic respiratory failure ([place]) [J96.01] 09/11/2017  . Melena [K92.1]   . Pressure injury of skin [L89.90] 09/09/2017  . Heme positive stool [R19.5]   . Acute upper GI bleed [K92.2] 09/06/2017  . HTN (hypertension) [I10] 09/06/2017  . Seizure disorder ([place]) [Z66.294] 09/06/2017  . Chronic atrial fibrillation ([place]) [I48.2] 09/06/2017  . Thyroid nodule [E04.1] 09/06/2017  . CKD (chronic kidney disease), stage III ([place]) [N18.3] 09/06/2017  . Dyslipidemia [E78.5] 09/06/2017  . COPD (chronic obstructive pulmonary disease) ([place]) [J44.9] 09/06/2017  . CKD (chronic kidney disease) stage 3, GFR 30-59 ml/min (HCC) [N18.3]   . Malignant neoplasm of right upper lobe of lung ([place]) [C34.11] 06/21/2017  . Pulmonary emphysema ([place]) [J43.9] 04/26/2017  . Pleural effusion [J90] 04/26/2017  . Near syncope [R55]   . Gastroesophageal reflux disease [K21.9]   . Chest pain [R07.9] 04/14/2017  . Lung nodule < 6cm on CT [R91.1] 04/12/2017  . Acute renal  failure ([place]) [N17.9]   . Sepsis ([place]) [A41.9] 04/06/2017  . Atrial fibrillation with RVR ([place]) [I48.91] 04/06/2017  . Urinary frequency [R35.0] 03/03/2017  . Hemorrhoids [K64.9] 03/03/2017  . Insomnia [G47.00] 03/03/2017  . Postoperative anemia due to acute blood loss [D62] 11/24/2016  . Hematoma left thigh [T14.8XXA] 11/24/2016  . Closed femur fracture ([place]) [S72.90XA] 11/20/2016  . Pedal edema [R60.0] 10/10/2016  . Persistent atrial fibrillation ([place]) [I48.1] 12/11/2014  . S/P ablation of atrial fibrillation [Z98.890, Z86.79] 12/11/2014  . Orthostatic hypotension [I95.1] 12/11/2014  . Paroxysmal SVT (supraventricular tachycardia) ([place]) [I47.1] 12/11/2014  . Atherosclerotic peripheral vascular disease ([place]) [I70.209] 12/11/2014  . Paroxysmal atrial fibrillation ([place]) [I48.0] 11/05/2014  . Orthostasis [I95.1] 10/03/2014  . Non-compliant behavior [R46.89] 10/01/2014  . TIA (transient ischemic attack) [G45.9] 09/23/2014  . Numbness of left hand [R20.0]   . Acute on chronic diastolic heart failure ([place]) [I50.33] 09/21/2014  . Fracture of femoral neck, right ([place]) [S72.001A] 08/28/2014  . Distal radius fracture, right [S52.501A] 08/28/2014  . Hip fracture requiring operative repair ([place]) [S72.009A] 08/28/2014  . Paroxysmal spells [IMO0001] 03/28/2014  . Seizures ([place]) [R56.9] 03/28/2013  . Syncope and collapse [R55] 07/27/2012  . Routine health maintenance [Z00.00] 12/12/2011  . PERIPHERAL CIRCULATORY DISORDER [Z86.79] 04/10/2010  . Chronic diastolic CHF (congestive heart failure) ([place]) [I50.32] 07/17/2009  . Depression [F32.9] 07/04/2009  . Anxiety [F41.9] 01/19/2008  . HLD (hyperlipidemia) [E78.5] 11/14/2007  . Essential hypertension [I10] 11/14/2007  . PAROXYSMAL ATRIAL TACHYCARDIA [I47.1] 11/14/2007  . UTERINE PROLAPSE [N81.4] 11/14/2007    Total Time spent with patient: 1 hour  Subjective:   Kayla Kent  Kayla Kent is a 83 y.o. female patient admitted with multidrug resistant  Klebsiella pneumonia on 11/25.  HPI:   Per chart review, Kayla Kent was initially admitted for treatment of pneumonia and acute upper GI bleeding on 11/25. Her hospital course has been complicated by MCA stroke after Eliquis was discontinued for a procedure and aspiration pneumonia requiring intubation. She was discharged to CIR again today. She was initially admitted to CIR on 11/30 before she developed further medical complications. PEG tube was placed today.  On interview, Kayla Kent denies problems with her mood. She denies feeling sad and reports that her mood is okay. She does admit to a history of depression. She reports poor appetite since hospitalization. She denies problems with sleep. She reports poor ability to ambulate since her stroke. She denies SI, HI or AVH.    Past Psychiatric History: Depression and anxiety.  Risk to Self: Is patient at risk for suicide?: No Risk to Others:  None. Denies HI.  Prior Inpatient Therapy:  Denies  Prior Outpatient Therapy:  Unknown   Past Medical History:  Past Medical History:  Diagnosis Date  . Allergic rhinitis   . Anxiety   . CKD (chronic kidney disease) stage 3, GFR 30-59 ml/min (HCC)   . Emphysema of lung ([place])   . HLD (hyperlipidemia)   . HTN (hypertension)   . Intolerance of drug    orthostatic  . Paroxysmal supraventricular tachycardia ([place])   . PVD (peripheral vascular disease) ([place])   . Seizure ([place])   . Syncope and collapse   . Uterine prolapse without mention of vaginal wall prolapse     Past Surgical History:  Procedure Laterality Date  . CARDIOVERSION N/A 11/05/2014   Procedure: CARDIOVERSION;  Surgeon: Candee Furbish, MD;  Location: [facility] ENDOSCOPY;  Service: Cardiovascular;  Laterality: N/A;  . cataract surgery  08/2015  . CHOLECYSTECTOMY    . corrective eye surgery     as a child  . ESOPHAGOGASTRODUODENOSCOPY N/A 09/09/2017   Procedure: ESOPHAGOGASTRODUODENOSCOPY (EGD);  Surgeon: Mauri Pole, MD;  Location:  [facility] ENDOSCOPY;  Service: Endoscopy;  Laterality: N/A;  . INTRAMEDULLARY (IM) NAIL INTERTROCHANTERIC Left 11/21/2016   Procedure: INTRAMEDULLARY (IM) NAIL INTERTROCHANTRIC HEMI;  Surgeon: Newt Minion, MD;  Location: [facility];  Service: Orthopedics;  Laterality: Left;  . IR GASTROSTOMY TUBE MOD SED  09/26/2017  . IR PERCUTANEOUS ART THROMBECTOMY/INFUSION INTRACRANIAL INC DIAG ANGIO  09/11/2017  . IVD removed    . OPEN REDUCTION INTERNAL FIXATION (ORIF) DISTAL RADIAL FRACTURE Right 08/29/2014   Procedure: OPEN REDUCTION INTERNAL FIXATION (ORIF) DISTAL RADIAL FRACTURE;  Surgeon: Marianna Payment, MD;  Location: [facility];  Service: Orthopedics;  Laterality: Right;  . RADIOLOGY WITH ANESTHESIA N/A 09/11/2017   Procedure: RADIOLOGY WITH ANESTHESIA;  Surgeon: Luanne Bras, MD;  Location: [facility];  Service: Radiology;  Laterality: N/A;  . RF ablation PSVT     summer '10  . stress cardiolite  08/05/93  . TONSILLECTOMY    . TOTAL HIP ARTHROPLASTY Right 08/29/2014   Procedure: Right Hip Hemi Arthroplasty;  Surgeon: Marianna Payment, MD;  Location: [facility];  Service: Orthopedics;  Laterality: Right;  Hip procedure 1st wants Peg Board, [employer], [place].    Family History:  Family History  Problem Relation Age of Onset  . Mental illness Father        suicide  . Arthritis Father   . Hyperlipidemia Sister   . Hypertension Sister   . Heart disease Brother  CAD/MI  . Hypertension Brother   . Hypertension Unknown        family hx  . Colon cancer Neg Hx   . Breast cancer Neg Hx   . Diabetes Neg Hx   . Stroke Neg Hx   . Cancer Neg Hx   . Lung disease Neg Hx    Family Psychiatric  History: Denies Social History:  Social History   Substance and Sexual Activity  Alcohol Use No  . Alcohol/week: 0.0 oz     Social History   Substance and Sexual Activity  Drug Use No    Social History   Socioeconomic History  . Marital status: Widowed    Spouse name: None  . Number of  children: 2  . Years of education: 36  . Highest education level: None  Social Needs  . Financial resource strain: None  . Food insecurity - worry: None  . Food insecurity - inability: None  . Transportation needs - medical: None  . Transportation needs - non-medical: None  Occupational History  . Occupation: retired Radio producer  Tobacco Use  . Smoking status: Former Smoker    Packs/day: 0.25    Years: 38.00    Pack years: 9.50    Start date: 02/15/1952    Last attempt to quit: 10/18/1993    Years since quitting: 23.9  . Smokeless tobacco: Never Used  Substance and Sexual Activity  . Alcohol use: No    Alcohol/week: 0.0 oz  . Drug use: No  . Sexual activity: No  Other Topics Concern  . None  Social History Narrative   [employer], [employer]-BA, [employer]-early childhood. Married '55 -76 years.  1 son - 68; 1 daughter - 19; 3 grandchildren . Lives alone. ACP - discussed and provided packet on end of life care (Feb '13)   Patient is now widowed.   Patient is right-handed.   Patient drinks tea daily.      [facility] (04/26/17):   Originally from [facility]. Previously was a Pharmacist, hospital. No pets currently. No mold exposure. No bird exposure.    Additional Social History: She previously lived at home alone. Her husband is deceased. She has 2 children. She denies alcohol or illicit substance use. She was a former smoker.     Allergies:   Allergies  Allergen Reactions  . Chocolate Hives  . Codeine Other (See Comments)    Patient states she acts crazy  . Diazepam Other (See Comments)    Patient states she acts crazy.  . Fruit & Vegetable Daily [Nutritional Supplements] Hives and Swelling    peaches  . Iohexol Hives     Code: HIVES, Desc: pt gets 13 hr pre-meds, Onset Date: [id number]   . Latex Hives  . Peach [Prunus Persica] Hives  . Peanut-Containing Drug Products Hives and Swelling  . Penicillins Hives, Itching and Swelling    Has patient had a PCN reaction causing  immediate rash, facial/tongue/throat swelling, SOB or lightheadedness with hypotension: Yes Has patient had a PCN reaction causing severe rash involving mucus membranes or skin necrosis: No Has patient had a PCN reaction that required hospitalization No Has patient had a PCN reaction occurring within the last 10 years: No If all of the above answers are "NO", then may proceed with Cephalosporin use.   . Strawberry Extract Hives  . Sulfonamide Derivatives Hives    nausea  . Wheat Shortness Of Breath    Shortness of breath  . Wheat Bran Shortness Of Breath  .  Sulfamethoxazole Hives  . Crestor [Rosuvastatin Calcium] Rash  . Iodine Rash    Labs:  Results for orders placed or performed during the hospital encounter of 09/19/17 (from the past 48 hour(s))  Glucose, capillary     Status: None   Collection Time: 09/27/17 12:09 AM  Result Value Ref Range   Glucose-Capillary 78 65 - 99 mg/dL  Glucose, capillary     Status: None   Collection Time: 09/27/17  3:45 AM  Result Value Ref Range   Glucose-Capillary 86 65 - 99 mg/dL   Comment 1 Notify RN   Heparin level (unfractionated)     Status: None   Collection Time: 09/27/17  4:04 AM  Result Value Ref Range   Heparin Unfractionated 0.44 0.30 - 0.70 IU/mL    Comment:        IF HEPARIN RESULTS ARE BELOW EXPECTED VALUES, AND PATIENT DOSAGE HAS BEEN CONFIRMED, SUGGEST FOLLOW UP TESTING OF ANTITHROMBIN III LEVELS.   CBC with Differential/Platelet     Status: Abnormal   Collection Time: 09/27/17  6:32 AM  Result Value Ref Range   WBC 7.7 4.0 - 10.5 K/uL   RBC 2.98 (L) 3.87 - 5.11 MIL/uL   Hemoglobin 9.1 (L) 12.0 - 15.0 g/dL   HCT 29.2 (L) 36.0 - 46.0 %   MCV 98.0 78.0 - 100.0 fL   MCH 30.5 26.0 - 34.0 pg   MCHC 31.2 30.0 - 36.0 g/dL   RDW 16.6 (H) 11.5 - 15.5 %   Platelets 366 150 - 400 K/uL   Neutrophils Relative % 70 %   Neutro Abs 5.3 1.7 - 7.7 K/uL   Lymphocytes Relative 16 %   Lymphs Abs 1.2 0.7 - 4.0 K/uL   Monocytes Relative  13 %   Monocytes Absolute 1.0 0.1 - 1.0 K/uL   Eosinophils Relative 2 %   Eosinophils Absolute 0.1 0.0 - 0.7 K/uL   Basophils Relative 1 %   Basophils Absolute 0.0 0.0 - 0.1 K/uL  Comprehensive metabolic panel     Status: Abnormal   Collection Time: 09/27/17  6:32 AM  Result Value Ref Range   Sodium 138 135 - 145 mmol/L   Potassium 3.9 3.5 - 5.1 mmol/L   Chloride 107 101 - 111 mmol/L   CO2 26 22 - 32 mmol/L   Glucose, Bld 90 65 - 99 mg/dL   BUN 11 6 - 20 mg/dL   Creatinine, Ser 0.63 0.44 - 1.00 mg/dL   Calcium 8.1 (L) 8.9 - 10.3 mg/dL   Total Protein 4.8 (L) 6.5 - 8.1 g/dL   Albumin 2.0 (L) 3.5 - 5.0 g/dL   AST 19 15 - 41 U/L   ALT 27 14 - 54 U/L   Alkaline Phosphatase 61 38 - 126 U/L   Total Bilirubin 0.4 0.3 - 1.2 mg/dL   GFR calc non Af Amer >60 >60 mL/min   GFR calc Af Amer >60 >60 mL/min    Comment: (NOTE) The eGFR has been calculated using the CKD EPI equation. This calculation has not been validated in all clinical situations. eGFR's persistently <60 mL/min signify possible Chronic Kidney Disease.    Anion gap 5 5 - 15  Magnesium     Status: None   Collection Time: 09/27/17  6:32 AM  Result Value Ref Range   Magnesium 1.8 1.7 - 2.4 mg/dL  Phosphorus     Status: None   Collection Time: 09/27/17  6:32 AM  Result Value Ref Range   Phosphorus 4.1  2.5 - 4.6 mg/dL  Vancomycin, trough     Status: Abnormal   Collection Time: 09/27/17  6:32 AM  Result Value Ref Range   Vancomycin Tr 11 (L) 15 - 20 ug/mL  Glucose, capillary     Status: None   Collection Time: 09/27/17  7:51 AM  Result Value Ref Range   Glucose-Capillary 89 65 - 99 mg/dL  Glucose, capillary     Status: None   Collection Time: 09/27/17 12:43 PM  Result Value Ref Range   Glucose-Capillary 75 65 - 99 mg/dL  Glucose, capillary     Status: Abnormal   Collection Time: 09/27/17  4:14 PM  Result Value Ref Range   Glucose-Capillary 148 (H) 65 - 99 mg/dL  Glucose, capillary     Status: Abnormal   Collection  Time: 09/27/17  8:12 PM  Result Value Ref Range   Glucose-Capillary 130 (H) 65 - 99 mg/dL  Glucose, capillary     Status: Abnormal   Collection Time: 09/27/17 11:31 PM  Result Value Ref Range   Glucose-Capillary 118 (H) 65 - 99 mg/dL  Glucose, capillary     Status: Abnormal   Collection Time: 09/28/17  3:34 AM  Result Value Ref Range   Glucose-Capillary 117 (H) 65 - 99 mg/dL  Heparin level (unfractionated)     Status: None   Collection Time: 09/28/17  4:41 AM  Result Value Ref Range   Heparin Unfractionated 0.70 0.30 - 0.70 IU/mL    Comment:        IF HEPARIN RESULTS ARE BELOW EXPECTED VALUES, AND PATIENT DOSAGE HAS BEEN CONFIRMED, SUGGEST FOLLOW UP TESTING OF ANTITHROMBIN III LEVELS.   CBC with Differential/Platelet     Status: Abnormal   Collection Time: 09/28/17  4:41 AM  Result Value Ref Range   WBC 9.2 4.0 - 10.5 K/uL   RBC 3.31 (L) 3.87 - 5.11 MIL/uL   Hemoglobin 10.0 (L) 12.0 - 15.0 g/dL   HCT 32.3 (L) 36.0 - 46.0 %   MCV 97.6 78.0 - 100.0 fL   MCH 30.2 26.0 - 34.0 pg   MCHC 31.0 30.0 - 36.0 g/dL   RDW 16.1 (H) 11.5 - 15.5 %   Platelets 395 150 - 400 K/uL   Neutrophils Relative % 79 %   Neutro Abs 7.2 1.7 - 7.7 K/uL   Lymphocytes Relative 10 %   Lymphs Abs 1.0 0.7 - 4.0 K/uL   Monocytes Relative 10 %   Monocytes Absolute 0.9 0.1 - 1.0 K/uL   Eosinophils Relative 1 %   Eosinophils Absolute 0.1 0.0 - 0.7 K/uL   Basophils Relative 0 %   Basophils Absolute 0.0 0.0 - 0.1 K/uL  Comprehensive metabolic panel     Status: Abnormal   Collection Time: 09/28/17  4:41 AM  Result Value Ref Range   Sodium 137 135 - 145 mmol/L   Potassium 3.4 (L) 3.5 - 5.1 mmol/L   Chloride 103 101 - 111 mmol/L   CO2 26 22 - 32 mmol/L   Glucose, Bld 118 (H) 65 - 99 mg/dL   BUN 12 6 - 20 mg/dL   Creatinine, Ser 0.65 0.44 - 1.00 mg/dL   Calcium 8.2 (L) 8.9 - 10.3 mg/dL   Total Protein 5.5 (L) 6.5 - 8.1 g/dL   Albumin 2.2 (L) 3.5 - 5.0 g/dL   AST 22 15 - 41 U/L   ALT 26 14 - 54 U/L    Alkaline Phosphatase 78 38 - 126 U/L   Total Bilirubin  0.5 0.3 - 1.2 mg/dL   GFR calc non Af Amer >60 >60 mL/min   GFR calc Af Amer >60 >60 mL/min    Comment: (NOTE) The eGFR has been calculated using the CKD EPI equation. This calculation has not been validated in all clinical situations. eGFR's persistently <60 mL/min signify possible Chronic Kidney Disease.    Anion gap 8 5 - 15  Magnesium     Status: None   Collection Time: 09/28/17  4:41 AM  Result Value Ref Range   Magnesium 1.8 1.7 - 2.4 mg/dL  Phosphorus     Status: None   Collection Time: 09/28/17  4:41 AM  Result Value Ref Range   Phosphorus 3.0 2.5 - 4.6 mg/dL  Glucose, capillary     Status: Abnormal   Collection Time: 09/28/17  7:31 AM  Result Value Ref Range   Glucose-Capillary 125 (H) 65 - 99 mg/dL  Glucose, capillary     Status: Abnormal   Collection Time: 09/28/17 11:52 AM  Result Value Ref Range   Glucose-Capillary 117 (H) 65 - 99 mg/dL    No current facility-administered medications for this encounter.    No current outpatient medications on file.   Facility-Administered Medications Ordered in Other Encounters  Medication Dose Route Frequency Provider Last Rate Last Dose  . acetaminophen (TYLENOL) solution 500 mg  500 mg Per Tube Q4H PRN Love, Pamela S, PA-C      . alum & mag hydroxide-simeth (MAALOX/MYLANTA) 200-200-20 MG/5ML suspension 30 mL  30 mL Per Tube Q4H PRN Love, Pamela S, PA-C      . apixaban (ELIQUIS) tablet 2.5 mg  2.5 mg Per Tube BID Love, Pamela S, PA-C   2.5 mg at 09/28/17 2248  . [START ON 09/29/2017] aspirin chewable tablet 81 mg  81 mg Per Tube Daily Love, Pamela S, PA-C      . atorvastatin (LIPITOR) tablet 20 mg  20 mg Per Tube q1800 Bary Leriche, PA-C   20 mg at 09/28/17 1729  . bisacodyl (DULCOLAX) suppository 10 mg  10 mg Rectal Daily PRN Love, Pamela S, PA-C      . budesonide (PULMICORT) nebulizer solution 0.25 mg  0.25 mg Nebulization BID Love, Pamela S, PA-C   0.25 mg at 09/28/17  2145  . chlorhexidine (PERIDEX) 0.12 % solution 15 mL  15 mL Mouth Rinse BID Love, Pamela S, PA-C      . feeding supplement (OSMOLITE 1.2 CAL) liquid 1,000 mL  1,000 mL Per Tube Continuous Bary Leriche, PA-C 70 mL/hr at 09/28/17 1728 1,000 mL at 09/28/17 1728  . free water 200 mL  200 mL Per Tube Q8H Love, Pamela S, PA-C      . insulin aspart (novoLOG) injection 0-9 Units  0-9 Units Subcutaneous Q4H Love, Pamela S, PA-C      . ipratropium-albuterol (DUONEB) 0.5-2.5 (3) MG/3ML nebulizer solution 3 mL  3 mL Nebulization Q4H PRN Bary Leriche, PA-C   3 mL at 09/28/17 2145  . levETIRAcetam (KEPPRA) 100 MG/ML solution 250 mg  250 mg Per Tube BID Bary Leriche, PA-C   250 mg at 09/28/17 2248  . [START ON 09/29/2017] levofloxacin (LEVAQUIN) tablet 500 mg  500 mg Per Tube Daily Love, Pamela S, PA-C      . lidocaine (XYLOCAINE) 2 % jelly   Topical PRN Love, Pamela S, PA-C      . MEDLINE mouth rinse  15 mL Mouth Rinse q12n4p Love, Pamela S, PA-C   15 mL  at 09/28/17 1729  . metoprolol tartrate (LOPRESSOR) 25 mg/10 mL oral suspension 25 mg  25 mg Per Tube BID Bary Leriche, PA-C   25 mg at 09/28/17 2247  . pantoprazole sodium (PROTONIX) 40 mg/20 mL oral suspension 40 mg  40 mg Per Tube BID Bary Leriche, PA-C   40 mg at 09/28/17 2248  . polyethylene glycol (MIRALAX / GLYCOLAX) packet 17 g  17 g Per Tube BID Bary Leriche, PA-C   17 g at 09/28/17 2248   Followed by  . [START ON 09/30/2017] polyethylene glycol (MIRALAX / GLYCOLAX) packet 17 g  17 g Per Tube Daily Love, Pamela S, PA-C      . potassium chloride (KLOR-CON) packet 20 mEq  20 mEq Per Tube BID Reesa Chew S, PA-C   20 mEq at 09/28/17 2247  . simethicone (MYLICON) 40 PY/1.9JK suspension 40 mg  40 mg Per Tube QID PRN Love, Pamela S, PA-C      . sodium chloride (OCEAN) 0.65 % nasal spray 1 spray  1 spray Each Nare PRN Love, Pamela S, PA-C      . sodium phosphate (FLEET) 7-19 GM/118ML enema 1 enema  1 enema Rectal Daily PRN Bary Leriche, PA-C         Musculoskeletal: Strength & Muscle Tone: decreased due to physical deconditioning and recent stroke.  Gait & Station: UTA since patient lying in bed. Patient leans: N/A  Psychiatric Specialty Exam: Physical Exam  Nursing note and vitals reviewed. Constitutional: She is oriented to person, place, and time. She appears well-developed.  Thin   HENT:  Head: Normocephalic and atraumatic.  Neck: Normal range of motion.  Respiratory: Effort normal.  Musculoskeletal: Normal range of motion.  Neurological: She is alert and oriented to person, place, and time.  Skin: No rash noted.  Psychiatric: She has a normal mood and affect. Her behavior is normal. Judgment and thought content normal. Her speech is delayed. Cognition and memory are normal.    Review of Systems  Constitutional: Negative for chills and fever.  Gastrointestinal: Positive for constipation. Negative for abdominal pain, diarrhea, nausea and vomiting.  Neurological: Negative for headaches.  Psychiatric/Behavioral: Negative for depression, hallucinations, substance abuse and suicidal ideas. The patient does not have insomnia.     Blood pressure 132/73, pulse 93, temperature (!) 97.3 F (36.3 C), temperature source Axillary, resp. rate 20, height _0  (1.702 m), weight 46.4 kg (102 lb 4.7 oz), SpO2 96 %.Body mass index is 16.02 kg/m.  General Appearance: Well Groomed, thin, elderly, female with short gray hair and a hospital gown who is sitting in a chair. NAD.  Eye Contact:  Good  Speech:  Slow and Slurred with a hoarse voice.   Volume:  Decreased  Mood:  "Good"  Affect:  Constricted  Thought Process:  Goal Directed and Linear  Orientation:  Full (Time, Place, and Person)  Thought Content:  Logical  Suicidal Thoughts:  No  Homicidal Thoughts:  No  Memory:  Immediate;   Good Recent;   Good Remote;   Good  Judgement:  Good  Insight:  Good  Psychomotor Activity:  Decreased  Concentration:  Concentration: Good and  Attention Span: Good  Recall:  Good  Fund of Knowledge:  Good  Language:  Fair  Akathisia:  No  Handed:  Right  AIMS (if indicated):   N/A  Assets:  Financial Resources/Insurance Housing  ADL's:  Impaired  Cognition:  WNL  Sleep:   [employer]  Assessment:  MAKALEIGH REINARD is a 83 y.o. female who was admitted to the hospital  for treatment of pneumonia and acute upper GI bleeding on 11/25. Her hospital course has been complicated by MCA stroke after Eliquis was discontinued for a procedure and aspiration pneumonia requiring intubation. She denies problems with her mood/sadness or positive neurovegetative symptoms. It does not appear that she warrants antidepressant therapy at this time. She was informed to notify her treatment team if this changes.   Treatment Plan Summary: -Patient denies depressive symptoms at this time so medication recommendations will not be made at this time. Patient informed to notify primary team if this changes.  -Psychiatry will sign off on patient at this time. Please consult psychiatry again as needed.   Disposition: No evidence of imminent risk to self or others at present.   Patient does not meet criteria for psychiatric inpatient admission.  Faythe Dingwall, DO 09/28/2017 11:00 PM

## 2017-09-28 NOTE — Progress Notes (Addendum)
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    PT Comments    Pt admitted with above diagnosis. Pt currently with functional limitations due to balance and endurance deficits. Pt was able to ambulate with +2 mod to max assist with pt needing facilitation due to right lateral lean and posterior lean.  Progressing distance however needing same incr assist level.  Right UE continues to lack functional movement and pt needs reminders to keep right UE safe.   Pt will benefit from skilled PT to increase their independence and safety with mobility to allow discharge to the venue listed below.     Follow Up Recommendations  CIR     Equipment Recommendations  None recommended by PT    Recommendations for Other Services Rehab consult     Precautions / Restrictions Precautions Precautions: Fall Precaution Comments: R hemiplegia Restrictions Weight Bearing Restrictions: No    Mobility  Bed Mobility Overal bed mobility: Needs Assistance Bed Mobility: Rolling;Supine to Sit;Sit to Supine Rolling: Min assist Sidelying to sit: Mod assist Supine to sit: Mod assist     General bed mobility comments: pt need assist to come forward, then assisted minimally to scoot to EOB  Transfers Overall transfer level: Needs assistance Equipment used: 2 person hand held assist Transfers: Sit to/from Stand Sit to Stand: Max assist         General transfer comment: NEeded assist to power up and was slightly flexed as well as heavy posterior and right lateral lean.   Pt purewick catheter had leaked therefore had to cleanpt  up and bed changed.  once clean, pt was able to  ambulate with +2 assist.   Ambulation/Gait Ambulation/Gait assistance: Mod assist;Max assist;+2 physical assistance Ambulation Distance (Feet): 110 Feet Assistive device: 2 person hand held assist;1 person hand held assist Gait Pattern/deviations: Shuffle;Trunk flexed;Narrow base of support;Step-through pattern;Decreased step length - right;Decreased step length - left;Ataxic;Staggering left;Staggering right;Leaning posteriorly;Decreased weight shift to left;Decreased dorsiflexion - left;Decreased stance time - left   Gait velocity interpretation: Below normal speed for age/gender General Gait Details: Slightly flexed trunk needing constant cues to stand upright.  Pt with short steps and slightly ataxic and scissoring.  Pt leans right and posterior therefore giving faciliatation at trunk.  Pt also pushes with left UE for balance but tends to push away thus causing her to lean > to right.  Overall poor balance with definite need of external support.  Worked on trying to get incr weight shift to pts left side.  Went to counter and with cues pt was able to weight shift onto right side and touch counter with hip.  Decr carryover in functional gait.  Pt very fatigued once back to room and decr safety awareness as she fatigues.  Needed mod assist for safe descent into chair as pt not able to safely back up with cues.    Stairs            Wheelchair Mobility    Modified Rankin (Stroke Patients Only) Modified Rankin (Stroke Patients Only) Pre-Morbid Rankin Score: No symptoms Modified Rankin: Severe disability     Balance Overall balance assessment: Needs assistance Sitting-balance support: No upper extremity supported  Sitting balance-Leahy Scale: Fair Sitting balance - Comments: Initially max assist and progressed to min guard assist to sit EOB.  Postural control: Posterior lean;Right lateral lean Standing balance support: During functional activity;Single extremity supported;Bilateral  upper extremity supported Standing balance-Leahy Scale: Poor Standing balance comment: kyphotic posture with mod assist for standing balance with flexed posture as well as posterior and right lateral lean                            Cognition Arousal/Alertness: Awake/alert Behavior During Therapy: Flat affect Overall Cognitive Status: Difficult to assess(aphasia) Area of Impairment: Attention;Orientation;Following commands;Safety/judgement;Problem solving;Awareness;Memory                 Orientation Level: Time Current Attention Level: Sustained   Following Commands: Follows one step commands with increased time Safety/Judgement: Decreased awareness of deficits;Decreased awareness of safety Awareness: Intellectual Problem Solving: Decreased initiation;Slow processing General Comments: Pt responds yes to several questions.  Rhunette Croft to PT/OT      Exercises General Exercises - Lower Extremity Ankle Circles/Pumps: AROM;Both;5 reps;Seated Long Arc Quad: AROM;Both;10 reps;Seated Hip Flexion/Marching: AROM;Both;5 reps;Seated    General Comments General comments (skin integrity, edema, etc.): VSS      Pertinent Vitals/Pain Pain Assessment: Faces Faces Pain Scale: No hurt   VSS Home Living   Living Arrangements: Alone Available Help at Discharge: Family Type of Home: House Home Access: Stairs to enter Entrance Stairs-Rails: None Home Layout: One level Home Equipment: Grab bars - tub/shower;Shower seat;Walker - 2 wheels Additional Comments: per chart review; also Pt    Prior Function Level of Independence: Independent      Comments: PTA independent with ADL and home management. Does not use RW per her report.    PT Goals (current goals can now be found in the care plan section) Progress towards PT goals: Progressing toward goals    Frequency    Min 3X/week      PT Plan Current plan remains appropriate    Co-evaluation PT/OT/SLP  Co-Evaluation/Treatment: Yes Reason for Co-Treatment: Complexity of the patient's impairments (multi-system involvement) PT goals addressed during session: Mobility/safety with mobility        AM-PAC PT "6 Clicks" Daily Activity  Outcome Measure  Difficulty turning over in bed (including adjusting bedclothes, sheets and blankets)?: Unable Difficulty moving from lying on back to sitting on the side of the bed? : Unable Difficulty sitting down on and standing up from a chair with arms (e.g., wheelchair, bedside commode, etc,.)?: Unable Help needed moving to and from a bed to chair (including a wheelchair)?: A Lot Help needed walking in hospital room?: A Lot Help needed climbing 3-5 steps with a railing? : Total 6 Click Score: 8    End of Session Equipment Utilized During Treatment: Gait belt Activity Tolerance: Patient limited by fatigue Patient left: with call bell/phone within reach;in chair;with chair alarm set(Pt going for PEG and nursing asked to get pt back in bed.) Nurse Communication: Mobility status PT Visit Diagnosis: Muscle weakness (generalized) (M62.81);Hemiplegia and hemiparesis;Other abnormalities of gait and mobility (R26.89) Hemiplegia - Right/Left: Right Hemiplegia - dominant/non-dominant: Dominant Hemiplegia - caused by: Cerebral infarction     Time: 1033-1100 PT Time Calculation (min) (ACUTE ONLY): 27 min  Charges:  $Gait Training: 8-22 mins                    G Codes:       [place] [phone] (pager)  Frida Wahlstrom F Delmar Dondero 09/28/2017, 1:40 PM

## 2017-09-28 NOTE — Telephone Encounter (Signed)
Unfortunately I would not be able to assist, as I am not the Inpatient Attending.  I would suggest pt speak to the Hospitalist, or a Hospital administrative liason.
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
## 2017-09-29 ENCOUNTER — Inpatient Hospital Stay (HOSPITAL_COMMUNITY): Payer: Medicare Other | Admitting: Speech Pathology

## 2017-09-29 ENCOUNTER — Inpatient Hospital Stay (HOSPITAL_COMMUNITY): Payer: Medicare Other | Admitting: Physical Therapy

## 2017-09-29 ENCOUNTER — Inpatient Hospital Stay (HOSPITAL_COMMUNITY): Payer: Medicare Other | Admitting: Occupational Therapy

## 2017-09-29 DIAGNOSIS — J189 Pneumonia, unspecified organism: Secondary | ICD-10-CM

## 2017-09-29 DIAGNOSIS — E440 Moderate protein-calorie malnutrition: Secondary | ICD-10-CM

## 2017-09-29 DIAGNOSIS — I63512 Cerebral infarction due to unspecified occlusion or stenosis of left middle cerebral artery: Secondary | ICD-10-CM

## 2017-09-29 DIAGNOSIS — J690 Pneumonitis due to inhalation of food and vomit: Secondary | ICD-10-CM

## 2017-09-29 DIAGNOSIS — E876 Hypokalemia: Secondary | ICD-10-CM

## 2017-09-29 DIAGNOSIS — I6330 Cerebral infarction due to thrombosis of unspecified cerebral artery: Secondary | ICD-10-CM

## 2017-09-29 LAB — CBC WITH DIFFERENTIAL/PLATELET
BASOS ABS: 0 10*3/uL (ref 0.0–0.1)
BASOS PCT: 0 %
Eosinophils Absolute: 0.1 10*3/uL (ref 0.0–0.7)
Eosinophils Relative: 1 %
HEMATOCRIT: 29.1 % — AB (ref 36.0–46.0)
Hemoglobin: 9.2 g/dL — ABNORMAL LOW (ref 12.0–15.0)
LYMPHS PCT: 15 %
Lymphs Abs: 1.1 10*3/uL (ref 0.7–4.0)
MCH: 30.8 pg (ref 26.0–34.0)
MCHC: 31.6 g/dL (ref 30.0–36.0)
MCV: 97.3 fL (ref 78.0–100.0)
MONO ABS: 0.8 10*3/uL (ref 0.1–1.0)
Monocytes Relative: 12 %
NEUTROS ABS: 5.1 10*3/uL (ref 1.7–7.7)
NEUTROS PCT: 72 %
Platelets: 310 10*3/uL (ref 150–400)
RBC: 2.99 MIL/uL — AB (ref 3.87–5.11)
RDW: 16.4 % — AB (ref 11.5–15.5)
WBC: 7.2 10*3/uL (ref 4.0–10.5)

## 2017-09-29 LAB — COMPREHENSIVE METABOLIC PANEL
ALK PHOS: 62 U/L (ref 38–126)
ALT: 21 U/L (ref 14–54)
ANION GAP: 7 (ref 5–15)
AST: 22 U/L (ref 15–41)
Albumin: 2.1 g/dL — ABNORMAL LOW (ref 3.5–5.0)
BILIRUBIN TOTAL: 0.3 mg/dL (ref 0.3–1.2)
BUN: 17 mg/dL (ref 6–20)
CALCIUM: 8.2 mg/dL — AB (ref 8.9–10.3)
CO2: 26 mmol/L (ref 22–32)
Chloride: 105 mmol/L (ref 101–111)
Creatinine, Ser: 0.61 mg/dL (ref 0.44–1.00)
Glucose, Bld: 116 mg/dL — ABNORMAL HIGH (ref 65–99)
POTASSIUM: 4.3 mmol/L (ref 3.5–5.1)
Sodium: 138 mmol/L (ref 135–145)
TOTAL PROTEIN: 4.9 g/dL — AB (ref 6.5–8.1)

## 2017-09-29 LAB — GLUCOSE, CAPILLARY
GLUCOSE-CAPILLARY: 126 mg/dL — AB (ref 65–99)
GLUCOSE-CAPILLARY: 109 mg/dL — AB (ref 65–99)
GLUCOSE-CAPILLARY: 123 mg/dL — AB (ref 65–99)
Glucose-Capillary: 105 mg/dL — ABNORMAL HIGH (ref 65–99)
Glucose-Capillary: 117 mg/dL — ABNORMAL HIGH (ref 65–99)

## 2017-09-29 LAB — PREALBUMIN: Prealbumin: 16.9 mg/dL — ABNORMAL LOW (ref 18–38)

## 2017-09-29 NOTE — Care Management Note (Signed)
[facility] Individual Statement of Services  Patient Name:  Kayla Kent  Date:  09/29/2017  Welcome to the [facility].  Our goal is to provide you with an individualized program based on your diagnosis and situation, designed to meet your specific needs.  With this comprehensive rehabilitation program, you will be expected to participate in at least 3 hours of rehabilitation therapies Monday-Friday, with modified therapy programming on the weekends.  Your rehabilitation program will include the following services:  Physical Therapy (PT), Occupational Therapy (OT), Speech Therapy (ST), 24 hour per day rehabilitation nursing, Neuropsychology, Case Management (Social Worker), Rehabilitation Medicine, Nutrition Services and Pharmacy Services  Weekly team conferences will be held on Wednesday to discuss your progress.  Your Social Worker will talk with you frequently to get your input and to update you on team discussions.  Team conferences with you and your family in attendance may also be held.  Expected length of stay: 19-21 days  Overall anticipated outcome: min assist level overall  Depending on your progress and recovery, your program may change. Your Social Worker will coordinate services and will keep you informed of any changes. Your Social Worker's name and contact numbers are listed  below.  The following services may also be recommended but are not provided by the [facility] will be made to provide these services after discharge if needed.  Arrangements include referral to agencies that provide these services.  Your insurance has been verified to be:  Medicare & [place] Your primary doctor is:  Cathlean Cower  Pertinent information will be shared with your doctor and your insurance company.  Social Worker:   Ovidio Kin, [place] or ([phone]  Information discussed with and copy given to patient by: Elease Hashimoto, 09/29/2017, 1:20 PM
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
## 2017-09-29 NOTE — Evaluation (Signed)
Occupational Therapy Assessment and Plan  Patient Details  Name: Kayla Kent MRN: [id number] Date of Birth: 1932-05-11  OT Diagnosis: abnormal posture, cognitive deficits, hemiplegia affecting dominant side and muscle weakness (generalized) Rehab Potential: Rehab Potential (ACUTE ONLY): Good ELOS: 19-21 days   Today's Date: 09/29/2017 OT Individual Time: 0800-0900 OT Individual Time Calculation (min): 60 min     Problem List:  Patient Active Problem List   Diagnosis Date Noted  . PNA (pneumonia)   . Moderate protein-calorie malnutrition ([place])   . Dysphagia, post-stroke   . Aspiration pneumonia of right lower lobe ([place])   . Stage 3 chronic kidney disease ([place])   . Thrombocytosis ([place])   . PAF (paroxysmal atrial fibrillation) ([place])   . Severe protein-calorie malnutrition ([place])   . Hypokalemia   . Acute respiratory failure with hypoxia ([place]) 09/19/2017  . Dysphagia 09/19/2017  . History of GI bleed   . Acute ischemic left MCA stroke ([place]) 09/16/2017  . Cerebral thrombosis with cerebral infarction 09/13/2017  . Fever   . Aspiration pneumonia due to gastric secretions ([place])   . Leukocytosis   . Acute hypoxemic respiratory failure ([place]) 09/11/2017  . Melena   . Pressure injury of skin 09/09/2017  . Heme positive stool   . Acute upper GI bleed 09/06/2017  . HTN (hypertension) 09/06/2017  . Seizure disorder ([place]) 09/06/2017  . Chronic atrial fibrillation ([place]) 09/06/2017  . Thyroid nodule 09/06/2017  . CKD (chronic kidney disease), stage III ([place]) 09/06/2017  . Dyslipidemia 09/06/2017  . COPD (chronic obstructive pulmonary disease) ([place]) 09/06/2017  . CKD (chronic kidney disease) stage 3, GFR 30-59 ml/min (HCC)   . Malignant neoplasm of right upper lobe of lung ([place]) 06/21/2017  . Pulmonary emphysema ([place]) 04/26/2017  . Pleural effusion 04/26/2017  . Near syncope   . Gastroesophageal reflux disease   . Chest pain 04/14/2017  . Lung nodule < 6cm on CT 04/12/2017  .  Acute renal failure ([place])   . Sepsis ([place]) 04/06/2017  . Atrial fibrillation with RVR ([place]) 04/06/2017  . Urinary frequency 03/03/2017  . Hemorrhoids 03/03/2017  . Insomnia 03/03/2017  . Postoperative anemia due to acute blood loss 11/24/2016  . Hematoma left thigh 11/24/2016  . Closed femur fracture ([place]) 11/20/2016  . Pedal edema 10/10/2016  . Persistent atrial fibrillation ([place]) 12/11/2014  . S/P ablation of atrial fibrillation 12/11/2014  . Orthostatic hypotension 12/11/2014  . Paroxysmal SVT (supraventricular tachycardia) ([place]) 12/11/2014  . Atherosclerotic peripheral vascular disease ([place]) 12/11/2014  . Paroxysmal atrial fibrillation ([place]) 11/05/2014  . Orthostasis 10/03/2014  . Non-compliant behavior 10/01/2014  . TIA (transient ischemic attack) 09/23/2014  . Numbness of left hand   . Acute on chronic diastolic heart failure ([place]) 09/21/2014  . Fracture of femoral neck, right ([place]) 08/28/2014  . Distal radius fracture, right 08/28/2014  . Hip fracture requiring operative repair ([place]) 08/28/2014  . Paroxysmal spells 03/28/2014  . Seizures ([place]) 03/28/2013  . Syncope and collapse 07/27/2012  . Routine health maintenance 12/12/2011  . PERIPHERAL CIRCULATORY DISORDER 04/10/2010  . Chronic diastolic CHF (congestive heart failure) ([place]) 07/17/2009  . Depression 07/04/2009  . Anxiety 01/19/2008  . HLD (hyperlipidemia) 11/14/2007  . Essential hypertension 11/14/2007  . PAROXYSMAL ATRIAL TACHYCARDIA 11/14/2007  . UTERINE PROLAPSE 11/14/2007    Past Medical History:  Past Medical History:  Diagnosis Date  . Allergic rhinitis   . Anxiety   . CKD (chronic kidney disease) stage 3, GFR 30-59 ml/min (HCC)   . Emphysema of lung (  [place])   . HLD (hyperlipidemia)   . HTN (hypertension)   . Intolerance of drug    orthostatic  . Paroxysmal supraventricular tachycardia ([place])   . PVD (peripheral vascular disease) ([place])   . Seizure ([place])   . Syncope and collapse   . Uterine prolapse  without mention of vaginal wall prolapse    Past Surgical History:  Past Surgical History:  Procedure Laterality Date  . CARDIOVERSION N/A 11/05/2014   Procedure: CARDIOVERSION;  Surgeon: Candee Furbish, MD;  Location[facility] ENDOSCOPY;  Service: Cardiovascular;  Laterality: N/A;  . cataract surgery  08/2015  . CHOLECYSTECTOMY    . corrective eye surgery     as a child  . ESOPHAGOGASTRODUODENOSCOPY N/A 09/09/2017   Procedure: ESOPHAGOGASTRODUODENOSCOPY (EGD);  Surgeon: Mauri Pole, MD;  Location[facility] ENDOSCOPY;  Service: Endoscopy;  Laterality: N/A;  . INTRAMEDULLARY (IM) NAIL INTERTROCHANTERIC Left 11/21/2016   Procedure: INTRAMEDULLARY (IM) NAIL INTERTROCHANTRIC HEMI;  Surgeon: Newt Minion, MD;  Location: [place];  Service: Orthopedics;  Laterality: Left;  . IR GASTROSTOMY TUBE MOD SED  09/26/2017  . IR PERCUTANEOUS ART THROMBECTOMY/INFUSION INTRACRANIAL INC DIAG ANGIO  09/11/2017  . IVD removed    . OPEN REDUCTION INTERNAL FIXATION (ORIF) DISTAL RADIAL FRACTURE Right 08/29/2014   Procedure: OPEN REDUCTION INTERNAL FIXATION (ORIF) DISTAL RADIAL FRACTURE;  Surgeon: Marianna Payment, MD;  Location: [place];  Service: Orthopedics;  Laterality: Right;  . RADIOLOGY WITH ANESTHESIA N/A 09/11/2017   Procedure: RADIOLOGY WITH ANESTHESIA;  Surgeon: Luanne Bras, MD;  Location: [place];  Service: Radiology;  Laterality: N/A;  . RF ablation PSVT     summer '10  . stress cardiolite  08/05/93  . TONSILLECTOMY    . TOTAL HIP ARTHROPLASTY Right 08/29/2014   Procedure: Right Hip Hemi Arthroplasty;  Surgeon: Marianna Payment, MD;  Location: [place];  Service: Orthopedics;  Laterality: Right;  Hip procedure 1st wants [employer], [place].     Assessment & Plan Clinical Impression: Patient is a 83 y.o. year old female with recent admission to the hospital on 09/06/17 after found down and noted to be confused and agitated on eval by EMS. Work up revealed tarry stools due to UGIB, fever  due to ESBL UTI and aspiration PNA, Afib with RVR as well as acute on chronic CHF. She was treated with antibiotics and started on IV amiodarone. started on failure . Eliquis held and EGD done revealing mild inflammation and Gastritis. She has progressive SOB with increased WOB 11/25 and CT chest done revealing severe multilobar bronchopneumonia with moderate bilateral pleural effusions. PCCM recommended therapeutic thoracocentisis as well as gentle diuresis. Later that day she developed right sided weakness with facial droop and code stroke initiated. CT head negative for hemorrhage and she underwent cerebral angiogram with endovascular revascularization of occluded L-MCA with retrieval device and IA integrelin. Patient transferred to CIR on 09/28/2017 .    Patient currently requires max with basic self-care skills secondary to muscle weakness, impaired timing and sequencing, unbalanced muscle activation, motor apraxia, decreased coordination and decreased motor planning, decreased visual motor skills, decreased attention to right, right side neglect and decreased motor planning, decreased awareness, decreased problem solving and decreased memory and decreased sitting balance, decreased standing balance, decreased postural control, hemiplegia and decreased balance strategies.  Prior to hospitalization, patient could complete ADLs with independent .  Patient will benefit from skilled intervention to decrease level of assist with basic self-care skills and increase independence with basic self-care  skills prior to discharge home with care partner.  Anticipate patient will require minimal physical assistance and follow up home health.  OT - End of Session Endurance Deficit: Yes OT Assessment Rehab Potential (ACUTE ONLY): Good OT Barriers to Discharge: Decreased caregiver support OT Patient demonstrates impairments in the following area(s): Balance;Cognition;Endurance;Motor;Vision;Sensory;Perception OT  Basic ADL's Functional Problem(s): Eating;Grooming;Bathing;Dressing;Toileting OT Transfers Functional Problem(s): Toilet;Tub/Shower OT Additional Impairment(s): Fuctional Use of Upper Extremity OT Plan OT Intensity: Minimum of 1-2 x/day, 45 to 90 minutes OT Frequency: 5 out of 7 days OT Duration/Estimated Length of Stay: 19-21 days OT Treatment/Interventions: Balance/vestibular training;Cognitive remediation/compensation;Community reintegration;DME/adaptive equipment instruction;Neuromuscular re-education;UE/LE Strength taining/ROM;Wheelchair propulsion/positioning;Visual/perceptual remediation/compensation;Therapeutic Exercise;Patient/family education;Functional mobility training;Discharge planning;Self Care/advanced ADL retraining;Pain management;Therapeutic Activities;UE/LE Coordination activities OT Basic Self-Care Anticipated Outcome(s): min assist OT Toileting Anticipated Outcome(s): min assist OT Bathroom Transfers Anticipated Outcome(s): min assist OT Recommendation Patient destination: Home Follow Up Recommendations: 24 hour supervision/assistance;Home health OT Equipment Recommended: To be determined   Skilled Therapeutic Intervention Began working on selfcare retraining sit to stand from EOB.  Max instructional cueing for sequencing.  Some noted motor planning difficulty when attempting to squeeze out the washcloth and wash with it.  Pt spontaneously attempted to use the RUE to help squeeze it out, but exhibits no AROM in the wrist or hand.  She attempted to place the washcloth in her right hand after squeezing and instead dropped it into her lap.  Pt with no awareness of this and continued to try and wash her face with the RUE, without hand or wrist control and without the washcloth.  She needed mod assist to re-direct to using the LUE.  Increased lean to the right noted in sitting.  When asked if she felt like she was sitting up straight, she would reply with a head shake "no" and then  would reposition more to midline.  Max assist for standing balance with moderate pushing to the right as well secondary to perceptual deficits.  Max assist for donning pullover shirt.  With max assist for LB clothing as well secondary to decreased ability to reach her feet and increased hip discomfort when attempting to cross her LEs.  Max assist for stand pivot transfer to the wheelchair.  Pt left up in wheelchair at end of session with call button in reach and safety belt and chair alarm in place.    OT Evaluation Precautions/Restrictions  Precautions Precautions: Fall Precaution Comments: R hemiplegia Restrictions Weight Bearing Restrictions: No  Pain Pain Assessment Pain Assessment: No/denies pain Home Living/Prior Functioning Home Living Family/patient expects to be discharged to:: Private residence Living Arrangements: Alone Available Help at Discharge: Family Type of Home: House Home Access: Stairs to enter Technical brewer of Steps: 1 Entrance Stairs-Rails: None Home Layout: One level Bathroom Shower/Tub: Multimedia programmer: Handicapped height Bathroom Accessibility: Yes Additional Comments: No family present for eval, discharge info copied from acute chart.  Lives With: Alone IADL History Occupation: Retired Prior Function Level of Independence: Independent with basic ADLs  Able to Take Stairs?: Yes Driving: Yes Vocation: Retired Comments: PTA independent with ADL and home management. Does not use RW per her report.  ADL  See Function section of chart for details  Vision Baseline Vision/History: Wears glasses Wears Glasses: Reading only Patient Visual Report: Blurring of vision Vision Assessment?: Vision impaired- to be further tested in functional context Eye Alignment: Impaired (comment) Ocular Range of Motion: Restricted on the right;Other (comment)(right eye deviated downward and to the right) Tracking/Visual Pursuits: Right eye does not  track medially;Other (comment)(left eye tracks in all quadrants but very jerky tracking noted.  Right eye sits inferiorly and laterally with only mininmal movement toward midline but limited.  ) Visual Fields: Other (comment)(Pt able to notice towel and soap on right of midline but unable to accurately assess at this time.  ) Perception  Perception: Impaired Inattention/Neglect: Does not attend to right side of body;Impaired-to be further tested in functional context Comments: Decreased R attention, could be primarily due to R visual field deficits and R ocular alignment deficits, will continue to monitor Praxis Praxis: Intact Praxis Impairment Details: Motor planning;Ideomotor Cognition Overall Cognitive Status: No family/caregiver present to determine baseline cognitive functioning Arousal/Alertness: Awake/alert Year: 2018(Pt able to respond correctly to yes/no questions regarding orientation) Month: December Day of Week: Correct Memory: Appears intact(for basic yes/no questions regarding intellectual awareness) Immediate Memory Recall: (unable to complete secondary to expressive aphasia) Attention: Sustained Focused Attention: Appears intact Sustained Attention: Impaired Sustained Attention Impairment: Functional basic Awareness: Impaired Awareness Impairment: Anticipatory impairment Problem Solving: Impaired Problem Solving Impairment: Functional basic Safety/Judgment: Appears intact Comments: decreased awareness of deficits and R inattention Sensation Sensation Light Touch: Impaired Detail Light Touch Impaired Details: Impaired RUE Stereognosis: Not tested Hot/Cold: Not tested Proprioception: Not tested Additional Comments: difficult to assess due to cognition and aphasia Coordination Gross Motor Movements are Fluid and Coordinated: No Fine Motor Movements are Fluid and Coordinated: No Coordination and Movement Description: Pt with Brunnstrum stage V movement in the right  arm but only stage I in the hand.  Needed max hand over hand assistance to integrate as a stabilizer during selfcare tasks.   Motor  Motor Motor: Hemiplegia;Abnormal tone;Motor apraxia;Abnormal postural alignment and control Motor - Skilled Clinical Observations: Generalized weakness, increased RLE and RUE tone, pusher syndrome, R hemiparesis Mobility  Bed Mobility Bed Mobility: Supine to Sit;Sit to Supine;Rolling Left;Rolling Right Rolling Right: 5: Supervision Rolling Right Details: Verbal cues for precautions/safety;Verbal cues for technique Rolling Left: 5: Supervision Rolling Left Details: Verbal cues for technique;Verbal cues for precautions/safety Supine to Sit: 3: Mod assist Supine to Sit Details: Verbal cues for precautions/safety;Verbal cues for technique;Manual facilitation for placement;Manual facilitation for weight bearing Sit to Supine: 2: Max assist Sit to Supine - Details: Verbal cues for precautions/safety;Manual facilitation for placement;Verbal cues for technique;Manual facilitation for weight bearing Transfers Sit to Stand: 3: Mod assist;With upper extremity assist;From chair/3-in-1;From bed Sit to Stand Details: Manual facilitation for weight shifting;Manual facilitation for placement;Verbal cues for precautions/safety;Verbal cues for technique Stand to Sit: 3: Mod assist;With upper extremity assist;To bed;To chair/3-in-1 Stand to Sit Details (indicate cue type and reason): Verbal cues for technique;Manual facilitation for weight shifting;Verbal cues for precautions/safety;Manual facilitation for placement  Trunk/Postural Assessment  Cervical Assessment Cervical Assessment: Exceptions to WFL(Pt with cervical protraction and flexion) Thoracic Assessment Thoracic Assessment: Exceptions to WFL(thoracic kyphosis at rest with right trunk elongation) Lumbar Assessment Lumbar Assessment: Exceptions to WFL(sits in posterior pelvic tilt) Postural Control Postural Control:  Deficits on evaluation(increased lean to the right in sitting with pushing to the right in standing)  Balance Balance Balance Assessed: Yes Static Sitting Balance Static Sitting - Balance Support: Feet supported Static Sitting - Level of Assistance: 4: Min assist Dynamic Sitting Balance Dynamic Sitting - Balance Support: During functional activity Dynamic Sitting - Level of Assistance: 3: Mod assist Static Standing Balance Static Standing - Balance Support: During functional activity Static Standing - Level of Assistance: 2: Max assist Dynamic Standing Balance Dynamic Standing - Balance Support: During functional activity Dynamic Standing -  Level of Assistance: 2: Max assist Extremity/Trunk Assessment RUE Assessment RUE Assessment: Exceptions to [facility] for all joints, slight flexor tone noted in the biceps and internal rotators.  Brunnstrum stage V in the arm and elbow with stage I in the wrist and hand.  ) LUE Assessment LUE Assessment: Within Functional Limits   See Function Navigator for Current Functional Status.   Refer to Care Plan for Long Term Goals  Recommendations for other services: None    Discharge Criteria: Patient will be discharged from OT if patient refuses treatment 3 consecutive times without medical reason, if treatment goals not met, if there is a change in medical status, if patient makes no progress towards goals or if patient is discharged from hospital.  The above assessment, treatment plan, treatment alternatives and goals were discussed and mutually agreed upon: by patient  Kato Wieczorek OTR/L 09/29/2017, 4:38 PM

## 2017-09-29 NOTE — Progress Notes (Signed)
[place] PHYSICAL MEDICINE & REHABILITATION     PROGRESS NOTE  Subjective/Complaints:  Patient seen sitting up in bed this morning. She indicates she slept well overnight. She would like to know her therapy schedule for today.  ROS: Denies CP, SOB, nausea, vomiting, diarrhea.  Objective: Vital Signs: Blood pressure (!) 135/43, pulse 78, temperature 97.6 F (36.4 C), temperature source Oral, resp. rate 18, height 5\' 7"  (1.702 m), weight 43.5 kg (95 lb 14.4 oz), SpO2 94 %. Dg Chest Port 1 View  Result Date: 09/28/2017 CLINICAL DATA:  Followup shortness of breath and pneumonia. EXAM: PORTABLE CHEST 1 VIEW COMPARISON:  09/25/2017 FINDINGS: Chronic cardiomegaly and aortic atherosclerosis. Chronic markings persist in the left lung. Right perihilar pneumonia for cysts, similar in the suprahilar region and possibly worsened in the lower lung. No developing effusion of significance. IMPRESSION: Persistent pneumonia in the right suprahilar region. Worsening of pneumonia in the right infrahilar region. Electronically Signed   By: Nelson Chimes M.D.   On: 09/28/2017 07:42   Recent Labs    09/28/17 0441 09/29/17 0635  WBC 9.2 7.2  HGB 10.0* 9.2*  HCT 32.3* 29.1*  PLT 395 310   Recent Labs    09/28/17 0441 09/29/17 0635  NA 137 138  K 3.4* 4.3  CL 103 105  GLUCOSE 118* 116*  BUN 12 17  CREATININE 0.65 0.61  CALCIUM 8.2* 8.2*   CBG (last 3)  Recent Labs    09/28/17 1556 09/29/17 0003 09/29/17 0602  GLUCAP 104* 132* 126*    Wt Readings from Last 3 Encounters:  09/29/17 43.5 kg (95 lb 14.4 oz)  09/28/17 46.4 kg (102 lb 4.7 oz)  09/19/17 49.8 kg (109 lb 12.6 oz)    Physical Exam:  BP (!) 135/43 (BP Location: Right Arm)   Pulse 78   Temp 97.6 F (36.4 C) (Oral)   Resp 18   Ht 5\' 7"  (1.702 m)   Wt 43.5 kg (95 lb 14.4 oz)   SpO2 94%   BMI 15.02 kg/m  Constitutional: She appears well-developed. She appears cachectic. No distress.  HENT: Normocephalic and atraumatic.   Eyes: No discharge. Dysconjugate gaze with right eye in lateral field.  Cardiovascular: An irregular rhythm present.  Respiratory: Effort normal. Clear. GI: Bowel sounds are normal. She exhibits no distension. +PEG. Musculoskeletal: She exhibits no edema or tenderness in extremities.  Neurological: She is alert and oriented. Extropia right eye (chronic)  Right facial droop with right inattention.  Severe dysarthria with limited verbal output.  Able  to follow simple one step motor commands.  Motor: RUE: 4/5 shoulder abduction, 4-/5 elbow flex/ext, 0/5 wrist extension, hand grip. RLE: 4+/5 proximal to distal LUE/LLE: 5/5 proximal to distal  Skin: Skin is warm and dry. She is not diaphoretic.  Psychiatric: Her mood appears anxious. Her speech is slurred. She is slowed  Assessment/Plan: 1. Functional deficits secondary to left MCA infarct which require 3+ hours per day of interdisciplinary therapy in a comprehensive inpatient rehab setting. Physiatrist is providing close team supervision and 24 hour management of active medical problems listed below. Physiatrist and rehab team continue to assess barriers to discharge/monitor patient progress toward functional and medical goals.  Function:  Bathing Bathing position      Bathing parts      Bathing assist        Upper Body Dressing/Undressing Upper body dressing  Upper body assist        Lower Body Dressing/Undressing Lower body dressing                                  Lower body assist        Toileting Toileting          Toileting assist     Transfers Chair/bed transfer             Locomotion Ambulation           Wheelchair          Cognition Comprehension    Expression    Social Interaction    Problem Solving    Memory      Medical Problem List and Plan: 1.  Hemiparesis, aphasia, dysphagia secondary to Left MCA infarct.   Begin CIR 2.  DVT  Prophylaxis/Anticoagulation: Pharmaceutical: Other (comment)--Eliquis 3. Pain Management: Tylenol prn  4. Mood: LCSW to follow for evaluation and support.  5. Neuropsych: This patient is not fully capable of making decisions on her own behalf. 6. Skin/Wound Care: Pressure relief measures.Maintain adequate hydration/nutritional status.  7. Fluids/Electrolytes/Nutrition: Adjust tube feeds to maintain adequate nutritional status. Consulted dietitian for input and adjustment.  8. RUL/RLL aspiration PNA with leucocytosis:    WBC improving and antibiotics were narrowed with recommendations of  Antibiotics Day # 11/12.     CXR without improvement--monitor clinically.    May need to stop oral trials--but patient wants to eat.    Will check F/U 2 view films on Friday.  9. ?CKD stage III: Monitor renal status with routine checks. Water flushes via PEG.    Creatinine 0.61 on 12/13   Appears to be controlled with adequate hydration 10 Recent GIB: Back on Eliquis.    Hemoglobin 9.2 on 12/13   Continue to monitor 11. Seizure disorder: stable/controlled on Keppra. 12. A Fib: Monitor HR bid. Continue metoprolol and decrease ASA to 81 mg/day.  13. Thrombocytosis: Resolved   Likely reactive.    Will continue to monitor.    Platelets 310 on 12/13 14. Dysphagia: NPO (bite trials with ST ongoing) and tolerating tube feeds 15. Protein calorie malnutrition: Chronic with baseline albumin around 2.5 in the past 6 months.    Pre-albumin 16.9 on 12/13.    Continue to monitor 16. COPD (per X ray)/RUL bronchogenic lung CA: completed XRT 06/2017 17. Hypokalemia:    Potassium 4.3 on 12/13, after supplementation   Continue to monitor  LOS (Days) 1 A FACE TO FACE EVALUATION WAS PERFORMED  Malyia Moro Lorie Phenix 09/29/2017 8:36 AM

## 2017-09-29 NOTE — Progress Notes (Signed)
Social Work Assessment and Plan Social Work Assessment and Plan  Patient Details  Name: Kayla Kent MRN: [id number] Date of Birth: 22-Jun-1932  Today's Date: 09/29/2017  Problem List:  Patient Active Problem List   Diagnosis Date Noted  . PNA (pneumonia)   . Moderate protein-calorie malnutrition ([place])   . Dysphagia, post-stroke   . Aspiration pneumonia of right lower lobe ([place])   . Stage 3 chronic kidney disease ([place])   . Thrombocytosis ([place])   . PAF (paroxysmal atrial fibrillation) ([place])   . Severe protein-calorie malnutrition ([place])   . Hypokalemia   . Acute respiratory failure with hypoxia ([place]) 09/19/2017  . Dysphagia 09/19/2017  . History of GI bleed   . Acute ischemic left MCA stroke ([place]) 09/16/2017  . Cerebral thrombosis with cerebral infarction 09/13/2017  . Fever   . Aspiration pneumonia due to gastric secretions ([place])   . Leukocytosis   . Acute hypoxemic respiratory failure ([place]) 09/11/2017  . Melena   . Pressure injury of skin 09/09/2017  . Heme positive stool   . Acute upper GI bleed 09/06/2017  . HTN (hypertension) 09/06/2017  . Seizure disorder ([place]) 09/06/2017  . Chronic atrial fibrillation ([place]) 09/06/2017  . Thyroid nodule 09/06/2017  . CKD (chronic kidney disease), stage III ([place]) 09/06/2017  . Dyslipidemia 09/06/2017  . COPD (chronic obstructive pulmonary disease) ([place]) 09/06/2017  . CKD (chronic kidney disease) stage 3, GFR 30-59 ml/min (HCC)   . Malignant neoplasm of right upper lobe of lung ([place]) 06/21/2017  . Pulmonary emphysema ([place]) 04/26/2017  . Pleural effusion 04/26/2017  . Near syncope   . Gastroesophageal reflux disease   . Chest pain 04/14/2017  . Lung nodule < 6cm on CT 04/12/2017  . Acute renal failure ([place])   . Sepsis ([place]) 04/06/2017  . Atrial fibrillation with RVR ([place]) 04/06/2017  . Urinary frequency 03/03/2017  . Hemorrhoids 03/03/2017  . Insomnia 03/03/2017  . Postoperative anemia due to acute blood loss 11/24/2016  .  Hematoma left thigh 11/24/2016  . Closed femur fracture ([place]) 11/20/2016  . Pedal edema 10/10/2016  . Persistent atrial fibrillation ([place]) 12/11/2014  . S/P ablation of atrial fibrillation 12/11/2014  . Orthostatic hypotension 12/11/2014  . Paroxysmal SVT (supraventricular tachycardia) ([place]) 12/11/2014  . Atherosclerotic peripheral vascular disease ([place]) 12/11/2014  . Paroxysmal atrial fibrillation ([place]) 11/05/2014  . Orthostasis 10/03/2014  . Non-compliant behavior 10/01/2014  . TIA (transient ischemic attack) 09/23/2014  . Numbness of left hand   . Acute on chronic diastolic heart failure ([place]) 09/21/2014  . Fracture of femoral neck, right ([place]) 08/28/2014  . Distal radius fracture, right 08/28/2014  . Hip fracture requiring operative repair ([place]) 08/28/2014  . Paroxysmal spells 03/28/2014  . Seizures ([place]) 03/28/2013  . Syncope and collapse 07/27/2012  . Routine health maintenance 12/12/2011  . PERIPHERAL CIRCULATORY DISORDER 04/10/2010  . Chronic diastolic CHF (congestive heart failure) ([place]) 07/17/2009  . Depression 07/04/2009  . Anxiety 01/19/2008  . HLD (hyperlipidemia) 11/14/2007  . Essential hypertension 11/14/2007  . PAROXYSMAL ATRIAL TACHYCARDIA 11/14/2007  . UTERINE PROLAPSE 11/14/2007   Past Medical History:  Past Medical History:  Diagnosis Date  . Allergic rhinitis   . Anxiety   . CKD (chronic kidney disease) stage 3, GFR 30-59 ml/min (HCC)   . Emphysema of lung ([place])   . HLD (hyperlipidemia)   . HTN (hypertension)   . Intolerance of drug    orthostatic  . Paroxysmal supraventricular tachycardia ([place])   . PVD (peripheral vascular disease) ([place])   .  Seizure ([place])   . Syncope and collapse   . Uterine prolapse without mention of vaginal wall prolapse    Past Surgical History:  Past Surgical History:  Procedure Laterality Date  . CARDIOVERSION N/A 11/05/2014   Procedure: CARDIOVERSION;  Surgeon: Candee Furbish, MD;  Location: [facility] ENDOSCOPY;  Service:  Cardiovascular;  Laterality: N/A;  . cataract surgery  08/2015  . CHOLECYSTECTOMY    . corrective eye surgery     as a child  . ESOPHAGOGASTRODUODENOSCOPY N/A 09/09/2017   Procedure: ESOPHAGOGASTRODUODENOSCOPY (EGD);  Surgeon: Mauri Pole, MD;  Location: [facility] ENDOSCOPY;  Service: Endoscopy;  Laterality: N/A;  . INTRAMEDULLARY (IM) NAIL INTERTROCHANTERIC Left 11/21/2016   Procedure: INTRAMEDULLARY (IM) NAIL INTERTROCHANTRIC HEMI;  Surgeon: Newt Minion, MD;  Location: [facility];  Service: Orthopedics;  Laterality: Left;  . IR GASTROSTOMY TUBE MOD SED  09/26/2017  . IR PERCUTANEOUS ART THROMBECTOMY/INFUSION INTRACRANIAL INC DIAG ANGIO  09/11/2017  . IVD removed    . OPEN REDUCTION INTERNAL FIXATION (ORIF) DISTAL RADIAL FRACTURE Right 08/29/2014   Procedure: OPEN REDUCTION INTERNAL FIXATION (ORIF) DISTAL RADIAL FRACTURE;  Surgeon: Marianna Payment, MD;  Location: [facility];  Service: Orthopedics;  Laterality: Right;  . RADIOLOGY WITH ANESTHESIA N/A 09/11/2017   Procedure: RADIOLOGY WITH ANESTHESIA;  Surgeon: Luanne Bras, MD;  Location: [facility];  Service: Radiology;  Laterality: N/A;  . RF ablation PSVT     summer '10  . stress cardiolite  08/05/93  . TONSILLECTOMY    . TOTAL HIP ARTHROPLASTY Right 08/29/2014   Procedure: Right Hip Hemi Arthroplasty;  Surgeon: Marianna Payment, MD;  Location: [facility];  Service: Orthopedics;  Laterality: Right;  Hip procedure 1st wants [employer], [employer], [place].    Social History:  reports that she quit smoking about 23 years ago. She started smoking about 65 years ago. She has a 9.50 pack-year smoking history. she has never used smokeless tobacco. She reports that she does not drink alcohol or use drugs.  Family / Support Systems Marital Status: Widow/Widower Patient Roles: Parent Children: Michelle-daughter  [phone]  [employer] [phone] [place] Other Supports: Rudell Cobb Anticipated Caregiver:  Daughter Ability/Limitations of Caregiver: Reynold Bowen is whether pt will reach the level where she can manage her at home Caregiver Availability: Other (Comment)(Await team and come up with a plan for discharge) Family Dynamics: Close knit family both son and daughter are involved and supportive and will work on a plan for pt. Pt has always been very independent and taken care of herself, she is not used to this role.  Social History Preferred language: English Religion: Methodist Cultural Background: No issues Education: [employer] Read: Yes Write: Yes Employment Status: Retired Freight forwarder Issues: No issues Guardian/Conservator: None-according to MD pt is not fully capable at this time to make her own decisions. Will look toward her children to make any decisions while here.    Abuse/Neglect Abuse/Neglect Assessment Can Be Completed: Yes Physical Abuse: Denies Verbal Abuse: Denies Sexual Abuse: Denies Exploitation of patient/patient's resources: Denies Self-Neglect: Denies  Emotional Status Pt's affect, behavior adn adjustment status: Pt is motivated to do well here. Due to the stroke she can only communicate with yes and no and hand gestures. This is frustating to her. Daughter voiced she has always been independent and prided herself on this and is not used to this new role. Recent Psychosocial Issues: other health issues-many issues while hospitalized Pyschiatric History: No history deferred depression due to speech/language deficits from her stroke.  She does try to get her basic needs expressed. She would benefit from seeing neuro-psych while here for coping with all that has happened to her, when it is appropriate Substance Abuse History: No issues  Patient / Family Perceptions, Expectations & Goals Pt/Family understanding of illness & functional limitations: Pt and daughter seem to realize her stroke and deficits. Daughter and son have spoken with the MD's and  feel their questions have been answered, although they have been frustrated at times with the medical team not communicating with one another. Premorbid pt/family roles/activities: Mom, retiree, grandmother, church member, etc Anticipated changes in roles/activities/participation: resume Pt/family expectations/goals: Pt seems to realize she is on the rehab unit and she will be getting therapies daily to recover from her stroke. Daughter states: " I want her to do well and see how much progress she can make while here."  [employer]: None Premorbid Home Care/DME Agencies: None Transportation available at discharge: Daughter Resource referrals recommended: Neuropsychology, Support group (specify)  Discharge Planning Living Arrangements: Alone Support Systems: Children, Water engineer, Social worker community Type of Residence: Private residence Insurance underwriter Resources: [employer], Multimedia programmer (specify)(BCBS) Financial Resources: Radio broadcast assistant Screen Referred: No Living Expenses: Own Money Management: Patient Does the patient have any problems obtaining your medications?: No Home Management: Self Patient/Family Preliminary Plans: Plan will depend upon pt's progress while here and what her care needs are. Daughter can provide some assist but not 24 hr according to her. She can hire assist if needed, but will need to see the goals and level at discharge for Mom. Sw Barriers to Discharge: Decreased caregiver support Sw Barriers to Discharge Comments: Does not have 24 hr care at this time Social Work Anticipated Follow Up Needs: HH/OP, SNF, Support Group  Clinical Impression Pleasant female who has had a lot happen to her while in the hospital. She seems motivated to participate in therapies and get well. Her daughter is involved and supportive and is an advocate for her Mom she has been frustrated with the process with the numerous MD's here and feels  they do not communicate well together. Son in [place] is also involved and visits frequently. Will work on a plan once team evaluates pt and set's goals. Work on a safe discharge plan, daughter aware pt will require 24 hr care upon discharge from rehab.  Elease Hashimoto 09/29/2017, 1:53 PM

## 2017-09-30 ENCOUNTER — Inpatient Hospital Stay (HOSPITAL_COMMUNITY): Payer: Medicare Other | Admitting: Speech Pathology

## 2017-09-30 ENCOUNTER — Inpatient Hospital Stay (HOSPITAL_COMMUNITY): Payer: Medicare Other

## 2017-09-30 ENCOUNTER — Inpatient Hospital Stay (HOSPITAL_COMMUNITY): Payer: Medicare Other | Admitting: Occupational Therapy

## 2017-09-30 ENCOUNTER — Inpatient Hospital Stay (HOSPITAL_COMMUNITY): Payer: Medicare Other | Admitting: Physical Therapy

## 2017-09-30 DIAGNOSIS — I69391 Dysphagia following cerebral infarction: Secondary | ICD-10-CM

## 2017-09-30 DIAGNOSIS — I69322 Dysarthria following cerebral infarction: Secondary | ICD-10-CM

## 2017-09-30 DIAGNOSIS — J189 Pneumonia, unspecified organism: Secondary | ICD-10-CM

## 2017-09-30 DIAGNOSIS — R918 Other nonspecific abnormal finding of lung field: Secondary | ICD-10-CM

## 2017-09-30 LAB — GLUCOSE, CAPILLARY
GLUCOSE-CAPILLARY: 113 mg/dL — AB (ref 65–99)
GLUCOSE-CAPILLARY: 131 mg/dL — AB (ref 65–99)
GLUCOSE-CAPILLARY: 98 mg/dL (ref 65–99)
GLUCOSE-CAPILLARY: 85 mg/dL (ref 65–99)
GLUCOSE-CAPILLARY: 119 mg/dL — AB (ref 65–99)
Glucose-Capillary: 115 mg/dL — ABNORMAL HIGH (ref 65–99)

## 2017-09-30 LAB — PROCALCITONIN: Procalcitonin: <0.1 ng/mL

## 2017-09-30 MED ORDER — LEVOFLOXACIN 500 MG PO TABS: 500.0000 mg | ORAL_TABLET | Freq: Every day | ORAL | Status: DC

## 2017-09-30 MED ORDER — GERHARDT'S BUTT CREAM
TOPICAL_CREAM | Freq: Four times a day (QID) | CUTANEOUS | Status: DC
  Administered 2017-09-30 – 2017-10-01 (×6): via TOPICAL
  Administered 2017-10-01: 1 via TOPICAL
  Administered 2017-10-01 – 2017-10-07 (×25): via TOPICAL
  Administered 2017-10-08: 1 via TOPICAL
  Administered 2017-10-08 – 2017-10-14 (×23): via TOPICAL
  Administered 2017-10-14: 1 via TOPICAL
  Administered 2017-10-14 – 2017-10-15 (×3): via TOPICAL
  Administered 2017-10-15: 1 via TOPICAL
  Administered 2017-10-15 – 2017-10-23 (×30): via TOPICAL
  Administered 2017-10-23: 1 via TOPICAL
  Administered 2017-10-24 (×2): via TOPICAL
  Filled 2017-09-30 (×4): qty 1

## 2017-09-30 MED ORDER — LEVOFLOXACIN 500 MG PO TABS
500.0000 mg | ORAL_TABLET | Freq: Every day | ORAL | Status: DC
  Administered 2017-10-01 – 2017-10-03 (×3): 500 mg
  Filled 2017-09-30 (×3): qty 1

## 2017-09-30 MED ORDER — FLUCONAZOLE 40 MG/ML PO SUSR
100.0000 mg | Freq: Every day | ORAL | Status: AC
  Administered 2017-09-30 – 2017-10-02 (×3): 100 mg
  Filled 2017-09-30 (×3): qty 2.5

## 2017-09-30 NOTE — Progress Notes (Signed)
[facility] PHYSICAL MEDICINE & REHABILITATION     PROGRESS NOTE  Subjective/Complaints:  Patient seen lying in bed this morning. She states she slept well overnight. It appears she would like to indicate something, but then nods that she does not need anything.  ROS: Appears to deny CP, SOB, nausea, vomiting, diarrhea.  Objective: Vital Signs: Blood pressure (!) 136/48, pulse 76, temperature 98.4 F (36.9 C), temperature source Oral, resp. rate 19, height 5\' 7"  (1.702 m), weight 42.9 kg (94 lb 9.2 oz), SpO2 96 %. No results found. Recent Labs    09/28/17 0441 09/29/17 0635  WBC 9.2 7.2  HGB 10.0* 9.2*  HCT 32.3* 29.1*  PLT 395 310   Recent Labs    09/28/17 0441 09/29/17 0635  NA 137 138  K 3.4* 4.3  CL 103 105  GLUCOSE 118* 116*  BUN 12 17  CREATININE 0.65 0.61  CALCIUM 8.2* 8.2*   CBG (last 3)  Recent Labs    09/30/17 0104 09/30/17 0526 09/30/17 0816  GLUCAP 113* 131* 115*    Wt Readings from Last 3 Encounters:  09/30/17 42.9 kg (94 lb 9.2 oz)  09/28/17 46.4 kg (102 lb 4.7 oz)  09/19/17 49.8 kg (109 lb 12.6 oz)    Physical Exam:  BP (!) 136/48 (BP Location: Right Arm)   Pulse 76   Temp 98.4 F (36.9 C) (Oral)   Resp 19   Ht 5\' 7"  (1.702 m)   Wt 42.9 kg (94 lb 9.2 oz)   SpO2 96%   BMI 14.81 kg/m  Constitutional: She appears well-developed. She appears cachectic. No distress.  HENT: Normocephalic and atraumatic.  Eyes: No discharge. Dysconjugate gaze with right eye in lateral field.  Cardiovascular: An irregularly rhythm present.  Respiratory: Effort normal. Clear. GI: Bowel sounds are normal. She exhibits no distension. +PEG. Musculoskeletal: She exhibits no edema or tenderness in extremities.  Neurological: She is alert. Extropia right eye (chronic)  Right facial droop with right inattention.  Severe dysarthria with limited verbal output, ?expressive aphasia.  Able  to follow simple one step motor commands.  Motor: RUE: 4/5 shoulder abduction,  4-/5 elbow flex/ext, 0/5 wrist extension, hand grip (stable). RLE: 4+/5 proximal to distal LUE/LLE: 5/5 proximal to distal  Skin: Skin is warm and dry. She is not diaphoretic.  Psychiatric: Her mood appears anxious. Her speech is slurred. She is slowed  Assessment/Plan: 1. Functional deficits secondary to left MCA infarct which require 3+ hours per day of interdisciplinary therapy in a comprehensive inpatient rehab setting. Physiatrist is providing close team supervision and 24 hour management of active medical problems listed below. Physiatrist and rehab team continue to assess barriers to discharge/monitor patient progress toward functional and medical goals.  Function:  Bathing Bathing position   Position: Sitting EOB  Bathing parts Body parts bathed by patient: Right arm, Chest, Abdomen, Right upper leg, Left upper leg Body parts bathed by helper: Left lower leg, Back, Front perineal area, Buttocks, Left arm, Right lower leg  Bathing assist        Upper Body Dressing/Undressing Upper body dressing   What is the patient wearing?: Pull over shirt/dress       Pull over shirt/dress - Perfomed by helper: Thread/unthread right sleeve, Thread/unthread left sleeve, Put head through opening, Pull shirt over trunk        Upper body assist        Lower Body Dressing/Undressing Lower body dressing   What is the patient wearing?: Pants, Non-skid slipper  socks       Pants- Performed by helper: Thread/unthread right pants leg, Thread/unthread left pants leg, Pull pants up/down   Non-skid slipper socks- Performed by helper: Don/doff right sock, Don/doff left sock                  Lower body assist        Toileting Toileting Toileting activity did not occur: No continent bowel/bladder event        Toileting assist     Transfers Chair/bed transfer   Chair/bed transfer method: Stand pivot, Squat pivot Chair/bed transfer assist level: Maximal assist (Pt 25 - 49%/lift  and lower) Chair/bed transfer assistive device: Armrests     Locomotion Ambulation Ambulation activity did not occur: Safety/medical concerns         Wheelchair   Type: Manual Max wheelchair distance: 100' Assist Level: Maximal assistance (Pt 25 - 49%)  Cognition Comprehension Comprehension assist level: Understands basic 75 - 89% of the time/ requires cueing 10 - 24% of the time  Expression Expression assist level: Expresses basic 50 - 74% of the time/requires cueing 25 - 49% of the time. Needs to repeat parts of sentences.  Social Interaction Social Interaction assist level: Interacts appropriately 75 - 89% of the time - Needs redirection for appropriate language or to initiate interaction.  Problem Solving Problem solving assist level: Solves basic 25 - 49% of the time - needs direction more than half the time to initiate, plan or complete simple activities  Memory Memory assist level: Recognizes or recalls 25 - 49% of the time/requires cueing 50 - 75% of the time    Medical Problem List and Plan: 1.  Hemiparesis, aphasia, dysphagia secondary to Left MCA infarct.   Continue CIR 2.  DVT Prophylaxis/Anticoagulation: Pharmaceutical: Other (comment)--Eliquis 3. Pain Management: Tylenol prn  4. Mood: LCSW to follow for evaluation and support.  5. Neuropsych: This patient is not fully capable of making decisions on her own behalf. 6. Skin/Wound Care: Pressure relief measures.Maintain adequate hydration/nutritional status.  7. Fluids/Electrolytes/Nutrition: Adjust tube feeds to maintain adequate nutritional status. Consulted dietitian for input and adjustment.  8. RUL/RLL aspiration PNA with leucocytosis:    WBC improving and antibiotics were narrowed with recommendations of  Antibiotics Day # 12/12.     CXR without improvement--monitor clinically.    May need to stop oral trials--but patient wants to eat.    Chest x-ray pending  9. ?CKD stage III: Monitor renal status with routine  checks. Water flushes via PEG.    Creatinine 0.61 on 12/13   Appears to be controlled with adequate hydration 10 Recent GIB: Back on Eliquis.    Hemoglobin 9.2 on 12/13   Continue to monitor 11. Seizure disorder: stable/controlled on Keppra. 12. A Fib: Monitor HR bid. Continue metoprolol and decrease ASA to 81 mg/day.  13. Thrombocytosis: Resolved   Likely reactive.    Will continue to monitor.    Platelets 310 on 12/13 14. Dysphagia: NPO (bite trials with ST ongoing) and tolerating tube feeds 15. Protein calorie malnutrition: Chronic with baseline albumin around 2.5 in the past 6 months.    Pre-albumin 16.9 on 12/13.    Continue to monitor 16. COPD (per X ray)/RUL bronchogenic lung CA: completed XRT 06/2017 17. Hypokalemia:    Potassium 4.3 on 12/13, after supplementation   Continue to monitor  LOS (Days) 2 A FACE TO FACE EVALUATION WAS PERFORMED  Ankit Lorie Phenix 09/30/2017 8:47 AM

## 2017-09-30 NOTE — Progress Notes (Signed)
Patient's sonm, Eulas Post called this mornining, asking about patient care plan for today.I informed him that the patient had a good night ,and she was in radiology that moment for a xray.Also,he wanted medical updates from our physician.Dr Posey Pronto and Algis Liming PA. were informed about family request .

## 2017-09-30 NOTE — Progress Notes (Signed)
Physical Therapy Session Note  Patient Details  Name: Kayla Kent MRN: [id number] Date of Birth: Feb 21, 1932  Today's Date: 09/30/2017 PT Individual Time: 0320-0420 PT Individual Time Calculation (min): 60 min   Short Term Goals: Week 1:  PT Short Term Goal 1 (Week 1): Pt will maintain static sitting balance w/ supervision PT Short Term Goal 2 (Week 1): Pt will transfer bed<>chair w/ mod assist PT Short Term Goal 3 (Week 1): Pt will self-propel w/c using L hemi technique w/ mod assist PT Short Term Goal 4 (Week 1): Pt will tolerate sitting OOB in between therapies for 2 hours at a time  PT Short Term Goal 5 (Week 1): Pt will maintain neutral trunk alignment in sitting or standing w/ minimal cues in 50% of trials   Skilled Therapeutic Interventions/Progress Updates:   Pt up in w/c upon arrival and agreeable to therapy, no c/o pain. Pt transported to/from gym total assist for time management. Worked on [employer] this session w/ focus on static and dynamic sitting balance, minimizing pusher syndrome w/ transfers, and postural control in seated. Transferred numerous times in both directions (towards R and towards L) w/ mod-max assist overall. Most assistance required w/ transfers toward R side 2/2 pt's tendency to push w/ L side in standing and posterior lean in standing. Verbal, tactile, and manual cues to decrease extensor pattern w/ both trunk and L extremities. Performed seated balance tasks in between transfers including anterior and lateral weight shifting. Performed LUE tasks at edge of BOS, trunk side flexion w/ elbow approximation, and AP and lateral scooting. Close supervision to min guard w/ seated activities, no LOB noted and pt maintaining neutral trunk alignment in sitting at edge of mat w/ LUE support. Pt following commands consistently throughout session and able to make needs known ~75% of time w/o encouragement. Pt reporting needing to toilet halfway through session, transported to/from  pt's room and performed toilet transfer w/ max assist. Total assist for pericare and LE garment management. Returned to room in w/c after completion of NMR activities in gym, total assist. Transferred to EOB and to supine w/ mod-max assist. Ended session in supine, call bell within reach and all needs met.   Therapy Documentation Precautions:  Precautions Precautions: Fall Precaution Comments: R hemiplegia Restrictions Weight Bearing Restrictions: No Pain: Pain Assessment Pain Assessment: No/denies pain  See Function Navigator for Current Functional Status.   Therapy/Group: Individual Therapy  Rosine Solecki K Arnette 09/30/2017, 7:07 PM

## 2017-09-30 NOTE — Progress Notes (Signed)
Nursing reports irritated peri-area. Patient incontinent with frequency--was on ditropan XL at home. PVRs ordered--one at 150 cc. Will order UA/UCS and need few more PVRs prior to resuming meds. Will reorder periwick due to incontinence and question candidal overgrowth with recent antibiotics. Gerhardt's butt cream ordered for protection and added 3 days of diflucan to treat yeast.   CXR this am pending.

## 2017-09-30 NOTE — Progress Notes (Signed)
Brief Note:  Received a message from nursing regarding request to call patient's son. Patient's son and discussed with him current status, medications, future plans. Answered all questions.

## 2017-09-30 NOTE — Consult Note (Signed)
Name: Kayla Kent MRN: [id number] DOB: [birth year]    ADMISSION DATE:  09/28/2017 CONSULTATION DATE:  09/30/2017  REFERRING MD :  Dr. Posey [facility]  CHIEF COMPLAINT:  Abnormal CXR  HISTORY OF PRESENT ILLNESS:  83 year old female with PMH as below, which is significant for atrial fibrillation on DOAC. She was recently admitted to [facility] for GI bleed/colitis and was taken off anticoagulation. Course was then complicated by CVA for which mechanical thrombectomy was performed. Course further complicated by what was presumed to be an aspiration PNA requiring intubation and ESBL klebsiella UTI. Once antibiotics were completed and this improved she was extubated and transferred to CIR for ongoing rehabilitation activities. She had to be re-admitted to [facility] for aspiration PNA 12/3 and was treated with a 10 day course of Levaquin. She was discharged back to CIR. Despite clinical improvement, right sided infiltrates persist on CXR. PCCM asked to see in consultation.   SIGNIFICANT EVENTS  11/20 admit with GIB suspected due to colitis. Course complicated by CVA and asp PNA 11/30 to CIR 12/3 admitted with aspiration PNA 12/10 PEG placed 12/12 back to CIR  STUDIES:  11/25 CT chest  > compatible with severe multilobar Bronchopneumonia. Moderate bilateral pleural effusions with areas of dependent atelectasis in the lower lobes of lungs bilaterally. Previously treated right upper lobe pulmonary nodule is now obscured by overlying airspace disease.  11/28 modified barium > NPO recommended 12/5 modified barium > NPO recommended  PAST MEDICAL HISTORY :   has a past medical history of Allergic rhinitis, Anxiety, CKD (chronic kidney disease) stage 3, GFR 30-59 ml/min (HCC), Emphysema of lung (HCC), HLD (hyperlipidemia), HTN (hypertension), Intolerance of drug, Paroxysmal supraventricular tachycardia ([place]), PVD (peripheral vascular disease) ([place]), Seizure ([place]), Syncope and collapse, and Uterine  prolapse without mention of vaginal wall prolapse.  has a past surgical history that includes corrective eye surgery; Cholecystectomy; Tonsillectomy; IVD removed; stress cardiolite (08/05/93); RF ablation PSVT; Total hip arthroplasty (Right, 08/29/2014); Open reduction internal fixation (orif) distal radial fracture (Right, 08/29/2014); Cardioversion (N/A, 11/05/2014); cataract surgery (08/2015); Intramedullary (im) nail intertrochanteric (Left, 11/21/2016); Esophagogastroduodenoscopy (N/A, 09/09/2017); Radiology with anesthesia (N/A, 09/11/2017); IR PERCUTANEOUS ART THROMBECTOMY/INFUSION INTRACRANIAL INC DIAG ANGIO (09/11/2017); and IR GASTROSTOMY TUBE MOD SED (09/26/2017). Prior to Admission medications   Medication Sig Start Date End Date Taking? Authorizing Provider  acetaminophen (TYLENOL) 325 MG tablet Take 2 tablets (650 mg total) by mouth every 4 (four) hours as needed for mild pain (or temp > 37.5 C (99.5 F)). 09/16/17  Yes Erick Colace, NP  aspirin 300 MG suppository Place 1 suppository (300 mg total) rectally daily. 09/17/17  Yes Erick Colace, NP  atorvastatin (LIPITOR) 20 MG tablet Take 1 tablet (20 mg total) by mouth daily at 6 PM. 09/16/17  Yes Erick Colace, NP  bisacodyl (DULCOLAX) 10 MG suppository Place 1 suppository (10 mg total) rectally daily as needed for moderate constipation. 09/16/17  Yes Erick Colace, NP  chlorhexidine gluconate, MEDLINE KIT, (PERIDEX) 0.12 % solution 15 mLs by Mouth Rinse route 2 (two) times daily. 09/16/17  Yes Erick Colace, NP  heparin 100-0.45 UNIT/ML-% infusion Inject 900 Units/hr into the vein continuous. 09/16/17  Yes Erick Colace, NP  ipratropium-albuterol (DUONEB) 0.5-2.5 (3) MG/3ML SOLN Take 3 mLs by nebulization every 4 (four) hours as needed. 09/16/17  Yes Erick Colace, NP  levETIRAcetam (KEPPRA) 100 MG/ML solution Take 2.5 mLs (250 mg total) by mouth 2 (two) times daily. 09/16/17  Yes  Erick Colace, NP  Nutritional  Supplements (FEEDING SUPPLEMENT, OSMOLITE 1.2 CAL,) LIQD Place 1,000 mLs into feeding tube continuous. 09/16/17  Yes Erick Colace, NP  pantoprazole sodium (PROTONIX) 40 mg/20 mL PACK Place 20 mLs (40 mg total) into feeding tube daily at 12 noon. 09/17/17  Yes Erick Colace, NP  senna-docusate (SENOKOT-S) 8.6-50 MG tablet 1 tablet by Per NG tube route at bedtime. 09/16/17  Yes Erick Colace, NP  sodium chloride (OCEAN) 0.65 % SOLN nasal spray Place 1 spray into both nostrils as needed for congestion. 09/16/17  Yes Erick Colace, NP  sodium chloride 0.9 % infusion Inject 250 mLs into the vein as needed (if IV carrier fluid needed.). 09/16/17  Yes Erick Colace, NP   Allergies  Allergen Reactions  . Chocolate Hives  . Codeine Other (See Comments)    Patient states she acts crazy  . Diazepam Other (See Comments)    Patient states she acts crazy.  . Fruit & Vegetable Daily [Nutritional Supplements] Hives and Swelling    peaches  . Iohexol Hives     Code: HIVES, Desc: pt gets 13 hr pre-meds, Onset Date: [id number]   . Latex Hives  . Peach [Prunus Persica] Hives  . Peanut-Containing Drug Products Hives and Swelling  . Penicillins Hives, Itching and Swelling    Has patient had a PCN reaction causing immediate rash, facial/tongue/throat swelling, SOB or lightheadedness with hypotension: Yes Has patient had a PCN reaction causing severe rash involving mucus membranes or skin necrosis: No Has patient had a PCN reaction that required hospitalization No Has patient had a PCN reaction occurring within the last 10 years: No If all of the above answers are "NO", then may proceed with Cephalosporin use.   . Strawberry Extract Hives  . Sulfonamide Derivatives Hives    nausea  . Wheat Shortness Of Breath    Shortness of breath  . Wheat Bran Shortness Of Breath  . Sulfamethoxazole Hives  . Crestor [Rosuvastatin Calcium] Rash  . Iodine Rash    FAMILY HISTORY:  family history includes  Arthritis in her father; Heart disease in her brother; Hyperlipidemia in her sister; Hypertension in her brother, sister, and unknown relative; Mental illness in her father. SOCIAL HISTORY:  reports that she quit smoking about 23 years ago. She started smoking about 65 years ago. She has a 9.50 pack-year smoking history. she has never used smokeless tobacco. She reports that she does not drink alcohol or use drugs.  Review of Systems:   Bolds are positive  Constitutional: weight loss, gain, night sweats, Fevers, chills, fatigue .  HEENT: headaches, Sore throat, sneezing, nasal congestion, post nasal drip, Difficulty swallowing, Tooth/dental problems, visual complaints visual changes, ear ache CV:  chest pain, radiates:,Orthopnea, PND, swelling in lower extremities, dizziness, palpitations, syncope.  GI  heartburn, indigestion, abdominal pain, nausea, vomiting, diarrhea, change in bowel habits, loss of appetite, bloody stools.  Resp: cough, productive: clear sputum, hemoptysis, dyspnea, chest pain, pleuritic.  Skin: rash or itching or icterus GU: dysuria, change in color of urine, urgency or frequency. flank pain, hematuria  MS: joint pain or swelling. decreased range of motion  Psych: change in mood or affect. depression or anxiety.  Neuro: difficulty with speech, weakness, numbness, ataxia    SUBJECTIVE: breathing comfortably. Minimally productive clear cough.   VITAL SIGNS: Temp:  [97.8 F (36.6 C)-98.4 F (36.9 C)] 98.4 F (36.9 C) (12/14 0217) Pulse Rate:  [76-82] 76 (12/14 0217) Resp:  [18-19] 19 (  12/14 0217) BP: (128-136)/(46-48) 136/48 (12/14 0217) SpO2:  [95 %-96 %] 96 % (12/14 0821) FiO2 (%):  [21 %] 21 % (12/14 0821) Weight:  [42.9 kg (94 lb 9.2 oz)] 42.9 kg (94 lb 9.2 oz) (12/14 0500)  PHYSICAL EXAMINATION: General:  Frail elderly female in NAD resting comfortable  Neuro:  Alert. Follows commands and responds appropriately, R facial droop and slurred speech. HEENT:   [place]/AT, PERRL, no JVD Cardiovascular:  RRR, no MRG Lungs:  Coarse R. Unlabored on room air.  Abdomen:  Soft, non-tender, non-distended Musculoskeletal:  No acute deformity, R sided weakness.  Skin: Grossly intact.   Recent Labs  Lab 09/27/17 0632 09/28/17 0441 09/29/17 0635  NA 138 137 138  K 3.9 3.4* 4.3  CL 107 103 105  CO2 26 26 26   BUN 11 12 17   CREATININE 0.63 0.65 0.61  GLUCOSE 90 118* 116*   Recent Labs  Lab 09/27/17 0632 09/28/17 0441 09/29/17 0635  HGB 9.1* 10.0* 9.2*  HCT 29.2* 32.3* 29.1*  WBC 7.7 9.2 7.2  PLT 366 395 310   Dg Chest 2 View  Result Date: 09/30/2017 CLINICAL DATA:  Pneumonia.  Emphysema. EXAM: CHEST  2 VIEW COMPARISON:  One-view chest x-ray 09/28/2017. FINDINGS: Heart is enlarged. Interstitial edema has increased slightly. Right upper lobe and lower lobe pneumonia has progressed. Left lower lobe airspace disease or atelectasis is noted. The visualized soft tissues and bony thorax are unremarkable. IMPRESSION: 1. Right upper and lower lobe pneumonia. 2. Cardiomegaly with increasing interstitial edema. Electronically Signed   By: San Morelle M.D.   On: 09/30/2017 09:32    ASSESSMENT / PLAN:  Pulmonary infiltrate: Right sided, persistent. This was initially presumed to be secondary to aspiration PNA. She has been treated for this with two separate courses of antibiotics without improvement. She could be continuing to aspirate, or radiography could just be slow to respond. There is potential for a para-pneumonic effusion to have developed or empyema however would expect her to appear more toxic clinically. Imaging not consistent with this. Also potential for pulmonary fibrosis secondary to continued aspiration. She is on BID PPI and is getting tube feeds.  Plan: Assess procalcitonin to help in determining appropriateness of continuing antibiotics. Continue Levaquin for now. Given clinical improvement, reasonable to just observe and repeat CXR in  one week. If no radiographic improvement after one week, or symptomatically worsening, would assess CT scan.  Check tube feed residuals to ensure no significant aspiration from below.  Georgann Housekeeper, AGACNP-BC [facility] Pulmonology/Critical Care Pager [phone] or [phone]  09/30/2017 1:42 PM

## 2017-09-30 NOTE — Progress Notes (Signed)
Speech Language Pathology Daily Session Note  Patient Details  Name: Kayla Kent MRN: [id number] Date of Birth: Dec 13, 1931  Today's Date: 09/30/2017 SLP Individual Time: 1415-1500 SLP Individual Time Calculation (min): 45 min  Short Term Goals: Week 1: SLP Short Term Goal 1 (Week 1): Pt will name familiar objects with mod assist multimodal cues to recognize and correct verbal errors.   SLP Short Term Goal 2 (Week 1): Pt will utilize a basic, text based augmented communication system via pointing to convey immediate needs and wants in ~50% of opportunities with max assist multimodal cues SLP Short Term Goal 3 (Week 1): Pt will slow rate and increase vocal intensity to achieve intelligibility at the word level for ~50% accuracy with max assist multimodal cues.   SLP Short Term Goal 4 (Week 1): Pt will consume therapeutic trials of honey thick liquids and purees with minimal overt s/s of aspiration and min cues for use of swallowing precautions over 3 consecutive targeted sessions prior to repeat objective study.   SLP Short Term Goal 5 (Week 1): Pt will sustain her attention to basic, familiar tasks for 5 minute intervals with mod verbal cues for redirection.    Skilled Therapeutic Interventions:  SLP attempted to see pt for scheduled therapy session at 0900; however, pt was being transferred off unit for follow up chest x ray.  On second attempt pt was resting in bed but easily awakened to voice and light touch, agreeable to getting out of bed.  Therapist suctioned a moderate amount of thick ropey secretions from hard and soft palate and provided oral care.  Pt consumed therapeutic trials of purees and honey thickened liquids with multiple swallows and wet vocal quality in 100% of trials.  Pt was stimulable for volitional throat clear and second swallow with mod-max assist multimodal cues which alleviated some of pt's audible wetness.  Overall, pt demonstrated decreased oral control of boluses and  needed increased cues for sequencing and coordination of self feeding in comparison to yesterday's eval.  Pt was able to intelligibly name basic objects around her room with mod-max assist demonstration cues to slow rate and coordinate phonation with respiration.  Pt was also able to verbalize key words from her textual communication board intelligibly with mod-max assist multimodal cues.  Pt was left in wheelchair with quick release belt donned.  Continue per current plan of care.    Function:  Eating Eating   Modified Consistency Diet: (trials of puree and honey thickened liquids with SLP ) Eating Assist Level: Hand over hand assist           Cognition Comprehension Comprehension assist level: Understands basic 75 - 89% of the time/ requires cueing 10 - 24% of the time  Expression   Expression assist level: Expresses basic 25 - 49% of the time/requires cueing 50 - 75% of the time. Uses single words/gestures.  Social Interaction Social Interaction assist level: Interacts appropriately 50 - 74% of the time - May be physically or verbally inappropriate.  Problem Solving Problem solving assist level: Solves basic 25 - 49% of the time - needs direction more than half the time to initiate, plan or complete simple activities  Memory Memory assist level: Recognizes or recalls 25 - 49% of the time/requires cueing 50 - 75% of the time    Pain Pain Assessment Pain Assessment: No/denies pain  Therapy/Group: Individual Therapy  Traivon Morrical, Selinda Orion 09/30/2017, 3:44 PM

## 2017-09-30 NOTE — Progress Notes (Signed)
Updated daughter on patient's medications (list given) and CXR results. Did relay that CXR without improvement but no leucocytosis or fevers. Will get pulmonary input on antibiotic regimen/treatment duration.

## 2017-09-30 NOTE — Progress Notes (Signed)
Occupational Therapy Session Note  Patient Details  Name: Kayla Kent MRN: [id number] Date of Birth: 10/07/1932  Today's Date: 09/30/2017 OT Individual Time: 1100-1200 OT Individual Time Calculation (min): 60 min    Short Term Goals: Week 1:  OT Short Term Goal 1 (Week 1): Pt will complete bathing sit to stand with mod assist. OT Short Term Goal 2 (Week 1): Pt will transfer to the 3:1 with mod assist stand pivot.   OT Short Term Goal 3 (Week 1): Pt will donn a pullover shirt with min assist and mod demonstrational cueing.  OT Short Term Goal 4 (Week 1): Pt will perform LB dressing with mod assist for gripper socks and pull up pants.   OT Short Term Goal 5 (Week 1): Pt will use the RUE as a stabilizer with max hand over hand assistance.    Skilled Therapeutic Interventions/Progress Updates:    1:1 Self care retraining at shower level. Performed bed to w/c and w/c <>shower bench with max A with extra time. Focus on functional use of right UE with mod A for control, dynamic sitting balance with min A. Pt responded well to verbal cues for postural control at midline. Focus on recall and executing hemi dressing techniques with min questioning cues. Sit to stands with max A with max mulitmodal cues for upright posture. Pt continues to demonstrate strong pushing to the left with uncertainty of midline in standing and fearfulness. Pt able communicate basic needs at word level and able to answer yes/no questions related to ADL tasks and choices/ wants with extra time. Left pt with daughter at end of session.   Therapy Documentation Precautions:  Precautions Precautions: Fall Precaution Comments: R hemiplegia Restrictions Weight Bearing Restrictions: No General:   Vital Signs: Oxygen Therapy SpO2: 96 % O2 Device: Not Delivered FiO2 (%): 21 % Pain: No c/o pain  See Function Navigator for Current Functional Status.   Therapy/Group: Individual Therapy  Willeen Cass  [facility] 09/30/2017, 12:01 PM

## 2017-10-01 ENCOUNTER — Inpatient Hospital Stay (HOSPITAL_COMMUNITY): Payer: Medicare Other | Admitting: Speech Pathology

## 2017-10-01 ENCOUNTER — Inpatient Hospital Stay (HOSPITAL_COMMUNITY): Payer: Medicare Other | Admitting: Occupational Therapy

## 2017-10-01 ENCOUNTER — Inpatient Hospital Stay (HOSPITAL_COMMUNITY): Payer: Medicare Other | Admitting: Physical Therapy

## 2017-10-01 LAB — GLUCOSE, CAPILLARY
GLUCOSE-CAPILLARY: 109 mg/dL — AB (ref 65–99)
GLUCOSE-CAPILLARY: 118 mg/dL — AB (ref 65–99)
GLUCOSE-CAPILLARY: 120 mg/dL — AB (ref 65–99)
GLUCOSE-CAPILLARY: 90 mg/dL (ref 65–99)
Glucose-Capillary: 115 mg/dL — ABNORMAL HIGH (ref 65–99)
Glucose-Capillary: 149 mg/dL — ABNORMAL HIGH (ref 65–99)

## 2017-10-01 LAB — PROCALCITONIN: Procalcitonin: <0.1 ng/mL

## 2017-10-01 NOTE — Progress Notes (Signed)
[facility] PHYSICAL MEDICINE & REHABILITATION     PROGRESS NOTE  Subjective/Complaints:  No new issues overnight.  Denies pain  ROS: limited due to language/communication    Objective: Vital Signs: Blood pressure (!) 128/50, pulse 75, temperature 98 F (36.7 C), temperature source Oral, resp. rate 18, height 5\' 7"  (1.702 m), weight 44.5 kg (98 lb 1.7 oz), SpO2 98 %. Dg Chest 2 View  Result Date: 09/30/2017 CLINICAL DATA:  Pneumonia.  Emphysema. EXAM: CHEST  2 VIEW COMPARISON:  One-view chest x-ray 09/28/2017. FINDINGS: Heart is enlarged. Interstitial edema has increased slightly. Right upper lobe and lower lobe pneumonia has progressed. Left lower lobe airspace disease or atelectasis is noted. The visualized soft tissues and bony thorax are unremarkable. IMPRESSION: 1. Right upper and lower lobe pneumonia. 2. Cardiomegaly with increasing interstitial edema. Electronically Signed   By: San Morelle M.D.   On: 09/30/2017 09:32   Recent Labs    09/29/17 0635  WBC 7.2  HGB 9.2*  HCT 29.1*  PLT 310   Recent Labs    09/29/17 0635  NA 138  K 4.3  CL 105  GLUCOSE 116*  BUN 17  CREATININE 0.61  CALCIUM 8.2*   CBG (last 3)  Recent Labs    10/01/17 0038 10/01/17 0341 10/01/17 0808  GLUCAP 118* 115* 149*    Wt Readings from Last 3 Encounters:  10/01/17 44.5 kg (98 lb 1.7 oz)  09/28/17 46.4 kg (102 lb 4.7 oz)  09/19/17 49.8 kg (109 lb 12.6 oz)    Physical Exam:  BP (!) 128/50 (BP Location: Right Arm)   Pulse 75   Temp 98 F (36.7 C) (Oral)   Resp 18   Ht 5\' 7"  (1.702 m)   Wt 44.5 kg (98 lb 1.7 oz)   SpO2 98%   BMI 15.37 kg/m  Constitutional: She appears well-developed. She appears cachectic. No distress.  HENT: Normocephalic and atraumatic.  Eyes: No discharge. Dysconjugate gaze with right eye in lateral field.  Cardiovascular: Irregularly irregular without JVD or murmur   Respiratory: Decreased breath sounds at the bases no distress. GI: Bowel sounds are  normal. She exhibits no distension. +PEG. Musculoskeletal: She exhibits no edema or tenderness in extremities.  Neurological: She is alert. Extropia right eye (chronic)  Right facial droop with right inattention.  Severe dysarthria with limited verbal output, ?expressive aphasia.  Able  to follow simple one step motor commands.  Motor: RUE: 4/5 shoulder abduction, 4-/5 elbow flex/ext, 0/5 wrist extension, hand grip (no change). RLE: 4+/5 proximal to distal LUE/LLE: 5/5 proximal to distal  Skin: Skin is warm and dry. She is not diaphoretic.  Psychiatric: Her mood appears anxious. Her speech is slurred. She is slowed  Assessment/Plan: 1. Functional deficits secondary to left MCA infarct which require 3+ hours per day of interdisciplinary therapy in a comprehensive inpatient rehab setting. Physiatrist is providing close team supervision and 24 hour management of active medical problems listed below. Physiatrist and rehab team continue to assess barriers to discharge/monitor patient progress toward functional and medical goals.  Function:  Bathing Bathing position   Position: Shower  Bathing parts Body parts bathed by patient: Right arm, Chest, Abdomen, Right upper leg, Left upper leg, Front perineal area Body parts bathed by helper: Left lower leg, Back, Buttocks, Left arm, Right lower leg  Bathing assist Assist Level: Touching or steadying assistance(Pt > 75%)      Upper Body Dressing/Undressing Upper body dressing   What is the patient wearing?: Pull  over shirt/dress     Pull over shirt/dress - Perfomed by patient: Put head through opening Pull over shirt/dress - Perfomed by helper: Thread/unthread right sleeve, Thread/unthread left sleeve, Pull shirt over trunk        Upper body assist Assist Level: Touching or steadying assistance(Pt > 75%)      Lower Body Dressing/Undressing Lower body dressing   What is the patient wearing?: Pants, Socks, Shoes       Pants- Performed  by helper: Thread/unthread right pants leg, Thread/unthread left pants leg, Pull pants up/down   Non-skid slipper socks- Performed by helper: Don/doff right sock, Don/doff left sock   Socks - Performed by helper: Don/doff right sock, Don/doff left sock   Shoes - Performed by helper: Don/doff right shoe, Don/doff left shoe, Fasten right, Fasten left          Lower body assist Assist for lower body dressing: Touching or steadying assistance (Pt > 75%)      Toileting Toileting Toileting activity did not occur: No continent bowel/bladder event        Toileting assist     Transfers Chair/bed transfer   Chair/bed transfer method: Stand pivot, Squat pivot Chair/bed transfer assist level: Maximal assist (Pt 25 - 49%/lift and lower) Chair/bed transfer assistive device: Armrests     Locomotion Ambulation Ambulation activity did not occur: Safety/medical concerns         Wheelchair   Type: Manual Max wheelchair distance: 100' Assist Level: Maximal assistance (Pt 25 - 49%)  Cognition Comprehension Comprehension assist level: Understands basic 75 - 89% of the time/ requires cueing 10 - 24% of the time  Expression Expression assist level: Expresses basic 25 - 49% of the time/requires cueing 50 - 75% of the time. Uses single words/gestures.  Social Interaction Social Interaction assist level: Interacts appropriately 50 - 74% of the time - May be physically or verbally inappropriate.  Problem Solving Problem solving assist level: Solves basic 25 - 49% of the time - needs direction more than half the time to initiate, plan or complete simple activities  Memory Memory assist level: Recognizes or recalls 25 - 49% of the time/requires cueing 50 - 75% of the time    Medical Problem List and Plan: 1.  Hemiparesis, aphasia, dysphagia secondary to Left MCA infarct.   Continue CIR 2.  DVT Prophylaxis/Anticoagulation: Pharmaceutical: Other (comment)--Eliquis 3. Pain Management: Tylenol prn   4. Mood: LCSW to follow for evaluation and support.  5. Neuropsych: This patient is not fully capable of making decisions on her own behalf. 6. Skin/Wound Care: Pressure relief measures.Maintain adequate hydration/nutritional status.  7. Fluids/Electrolytes/Nutrition: Adjust tube feeds to maintain adequate nutritional status. Consulted dietitian for input and adjustment.  8. RUL/RLL aspiration PNA   -Appreciate pulmonary consult.  Continue  Levaquin for now.  In general patient appears to be showing clinical signs of improvement. Remains afebrile 9. ?CKD stage III: Monitor renal status with routine checks. Water flushes via PEG.    Creatinine 0.61 on 12/13    Appears to be controlled with adequate hydration 10 Recent GIB: Back on Eliquis.    Hemoglobin 9.2 on 12/13   Continue to monitor 11. Seizure disorder: stable/controlled on Keppra. 12. A Fib: Monitor HR bid. Continue metoprolol and decrease ASA to 81 mg/day.  13. Thrombocytosis: Resolved   Likely reactive.    Will continue to monitor.    Platelets 310 on 12/13 14. Dysphagia: NPO (bite trials with ST ongoing) and tolerating tube feeds 15. Protein calorie  malnutrition: Chronic with baseline albumin around 2.5 in the past 6 months.    Pre-albumin 16.9 on 12/13.    Continue to monitor 16. COPD (per X ray)/RUL bronchogenic lung CA: completed XRT 06/2017 17. Hypokalemia:    Potassium 4.3 on 12/13, after supplementation   Continue to monitor  LOS (Days) 3 A FACE TO FACE EVALUATION WAS PERFORMED  Elnita Surprenant T 10/01/2017 8:21 AM

## 2017-10-01 NOTE — Progress Notes (Signed)
Physical Therapy Session Note  Patient Details  Name: Kayla Kent MRN: [id number] Date of Birth: 12-05-1931  Today's Date: 10/01/2017 PT Individual Time: 1355-1445 PT Individual Time Calculation (min): 50 min  and Today's Date: 10/01/2017 PT Missed Time: 10 Minutes Missed Time Reason: Patient fatigue;Patient unwilling to participate  Short Term Goals: Week 1:  PT Short Term Goal 1 (Week 1): Pt will maintain static sitting balance w/ supervision PT Short Term Goal 2 (Week 1): Pt will transfer bed<>chair w/ mod assist PT Short Term Goal 3 (Week 1): Pt will self-propel w/c using L hemi technique w/ mod assist PT Short Term Goal 4 (Week 1): Pt will tolerate sitting OOB in between therapies for 2 hours at a time  PT Short Term Goal 5 (Week 1): Pt will maintain neutral trunk alignment in sitting or standing w/ minimal cues in 50% of trials   Skilled Therapeutic Interventions/Progress Updates:   Pt supine upon arrival and agreeable to therapy, no c/o pain. Daughter present and was educated on pt's POC and progress so far. Daughter eager to see how much pt can do, however left 10 minutes into session. Pt transferred to EOB w/ mod assist and to w/c w/ max assist via stand pivot. RN present to disconnect pt from PEG feedings during session. Total assist w/c transport to/from gym for time management. Performed gait training both at rail and w/ RW, 30' x2 bouts. Min guard at rail in hallway, mod assist using RW 2/2 pt's L pushing and posterior lean. Total assist to maintain RUE contact w/ RW. Pt declining performing any more gait training, however agreeable to practice standing and maintain neutral alignment in standing w/ RW. Performed 1 stand w/ RW and then refusing to participate in further therapy 2/2 fatigue. Transported back to room and educated pt on importance of increasing tolerance to OOB activity and offered alternative to sit up in recliner, pt continued to decline. Transferred back to EOB and  back to supine w/ mod-max assist. Ended session in supine, call bell within reach and all needs met. Missed 10 minutes of skilled PT 2/2 fatigue.   Therapy Documentation Precautions:  Precautions Precautions: Fall Precaution Comments: R hemiplegia Restrictions Weight Bearing Restrictions: No General: PT Amount of Missed Time (min): 10 Minutes PT Missed Treatment Reason: Patient fatigue;Patient unwilling to participate Pain: Pain Assessment Pain Assessment: Faces Faces Pain Scale: No hurt  See Function Navigator for Current Functional Status.   Therapy/Group: Individual Therapy  Yahel Fuston K Arnette 10/01/2017, 2:46 PM

## 2017-10-01 NOTE — Progress Notes (Signed)
Occupational Therapy Session Note  Patient Details  Name: Kayla Kent MRN: [id number] Date of Birth: Nov 16, 1931  Today's Date: 10/01/2017 OT Individual Time: 4158-3094 OT Individual Time Calculation (min): 75 min    Short Term Goals: Week 1:  OT Short Term Goal 1 (Week 1): Pt will complete bathing sit to stand with mod assist. OT Short Term Goal 2 (Week 1): Pt will transfer to the 3:1 with mod assist stand pivot.   OT Short Term Goal 3 (Week 1): Pt will donn a pullover shirt with min assist and mod demonstrational cueing.  OT Short Term Goal 4 (Week 1): Pt will perform LB dressing with mod assist for gripper socks and pull up pants.   OT Short Term Goal 5 (Week 1): Pt will use the RUE as a stabilizer with max hand over hand assistance.    Skilled Therapeutic Interventions/Progress Updates:    Session focus on ADL/self-care retraining. Pt completes bed mobility with ModA, MaxA for stand pivot transfer EOB>w/c. Pt completed bathing/dressing ADLs seated in w/c at sink. Pt requires Max verbal cues throughout session for sequencing/completing task as well as for attending to R side. Pt requires ModA for UB bathing/Max HOH assist to use LUE to wash RUE. Pt completed sit<>stand x3 during task completion; MaxA for sit<>stand and MaxA for maintaining static standing balance when completing pericare after incontinent BM as well as donning LB clothing. Assisted Pt with crossing LEs to reach/wash lower portions of LEs. Pt requires total assist for LB dressing, MaxA for UB dressing using hemi techniques. Pt completes stand pivot transfer w/c>EOB with MaxA, ModA for scooting to [facility]. Pt left supine in bed, bed alarm activated, call bell and needs within reach.   Therapy Documentation Precautions:  Precautions Precautions: Fall Precaution Comments: R hemiplegia Restrictions Weight Bearing Restrictions: No   Pain: Pain Assessment Pain Assessment: Faces Faces Pain Scale: No hurt  See Function  Navigator for Current Functional Status.   Therapy/Group: Individual Therapy  Raymondo Band 10/01/2017, 12:15 PM

## 2017-10-01 NOTE — Progress Notes (Signed)
Speech Language Pathology Daily Session Note  Patient Details  Name: Kayla Kent MRN: [id number] Date of Birth: 16-Jun-1932  Today's Date: 10/01/2017 SLP Individual Time: 1035-1130 SLP Individual Time Calculation (min): 55 min  Short Term Goals: Week 1: SLP Short Term Goal 1 (Week 1): Pt will name familiar objects with mod assist multimodal cues to recognize and correct verbal errors.   SLP Short Term Goal 2 (Week 1): Pt will utilize a basic, text based augmented communication system via pointing to convey immediate needs and wants in ~50% of opportunities with max assist multimodal cues SLP Short Term Goal 3 (Week 1): Pt will slow rate and increase vocal intensity to achieve intelligibility at the word level for ~50% accuracy with max assist multimodal cues.   SLP Short Term Goal 4 (Week 1): Pt will consume therapeutic trials of honey thick liquids and purees with minimal overt s/s of aspiration and min cues for use of swallowing precautions over 3 consecutive targeted sessions prior to repeat objective study.   SLP Short Term Goal 5 (Week 1): Pt will sustain her attention to basic, familiar tasks for 5 minute intervals with mod verbal cues for redirection.    Skilled Therapeutic Interventions:  Pt was seen for skilled ST targeting goals for communication and dysphagia.  Pt was in bed and initiated request to be changed via gestures and vocalizations due to having been incontinent prior to therapist's arrival.  Pt was able to follow commands to assist in hygiene and donning a clean brief at bed level.  Pt transferred to wheelchair.  Pt was able to name 1 out of 3 grandkids from pictures in her room and provided 2 responses at the word level regarding details about her grandkids' lives (1 granddaughter goes to [facility] and 1 granddaughter likes to dance) with max assist verbal and contextual cues.  Pt consumed 3 1/2 teaspoon bites of applesauce with mod assist verbal cues needed to orally transit  boluses and initiate a swallow.  Vocal quality was difficult to assess on command immediately following trials but was wet throughout therapy session even before initiation of minimal POs.  Oral care completed to minimize bacterial load given that pt is likely aspirating her own secretions.  Pt was left in wheelchair with quick release belt donned.  Discussed with RN potential for getting pt a soft touch call given that current call bell on pt's bed does not reach pt when she is seated in wheelchair.    Continue per current plan of care.    Function:  Eating Eating   Modified Consistency Diet: (trials of puree with SLP ) Eating Assist Level: Hand over hand assist           Cognition Comprehension Comprehension assist level: Understands basic 75 - 89% of the time/ requires cueing 10 - 24% of the time  Expression   Expression assist level: Expresses basic 25 - 49% of the time/requires cueing 50 - 75% of the time. Uses single words/gestures.  Social Interaction Social Interaction assist level: Interacts appropriately 50 - 74% of the time - May be physically or verbally inappropriate.  Problem Solving Problem solving assist level: Solves basic 25 - 49% of the time - needs direction more than half the time to initiate, plan or complete simple activities  Memory Memory assist level: Recognizes or recalls 25 - 49% of the time/requires cueing 50 - 75% of the time    Pain Pain Assessment Pain Assessment: No/denies pain Faces Pain Scale:  No hurt  Therapy/Group: Individual Therapy  Jarron Curley, Selinda Orion 10/01/2017, 3:14 PM

## 2017-10-02 LAB — PROCALCITONIN

## 2017-10-02 LAB — GLUCOSE, CAPILLARY
GLUCOSE-CAPILLARY: 101 mg/dL — AB (ref 65–99)
GLUCOSE-CAPILLARY: 115 mg/dL — AB (ref 65–99)
GLUCOSE-CAPILLARY: 117 mg/dL — AB (ref 65–99)
GLUCOSE-CAPILLARY: 123 mg/dL — AB (ref 65–99)
Glucose-Capillary: 119 mg/dL — ABNORMAL HIGH (ref 65–99)
Glucose-Capillary: 127 mg/dL — ABNORMAL HIGH (ref 65–99)
Glucose-Capillary: 154 mg/dL — ABNORMAL HIGH (ref 65–99)

## 2017-10-02 MED ORDER — GUAIFENESIN-DM 100-10 MG/5ML PO SYRP
10.0000 mL | ORAL_SOLUTION | Freq: Three times a day (TID) | ORAL | Status: DC
  Administered 2017-10-02 – 2017-10-24 (×66): 10 mL
  Filled 2017-10-02 (×67): qty 10

## 2017-10-02 NOTE — Plan of Care (Signed)
  Not Progressing RH BOWEL ELIMINATION RH STG MANAGE BOWEL WITH ASSISTANCE Description STG Manage Bowel with [place].  Total assist-incontinent. 10/02/2017 0426 - Not Progressing by Cornell Barman, RN RH BLADDER ELIMINATION RH STG MANAGE BLADDER WITH ASSISTANCE Description STG Manage Bladder With Min Assistance  Total assist-incontinent, wears purewick at [place]. 10/02/2017 0426 - Not Progressing by Cornell Barman, RN RH SKIN INTEGRITY RH STG SKIN FREE OF INFECTION/BREAKDOWN Description No new breakdown with min assist/cues.   Stage 2 to sacrum with foam dressing. 10/02/2017 0426 - Not Progressing by Cornell Barman, RN RH COGNITION-NURSING RH STG ANTICIPATES NEEDS/CALLS FOR ASSIST W/ASSIST/CUES Description STG Anticipates Needs/Calls for Assist With Mod Assistance/Cues.  Needs encouragement to call for assistance. 10/02/2017 0426 - Not Progressing by Cornell Barman, RN

## 2017-10-02 NOTE — Plan of Care (Signed)
  Not Progressing RH BOWEL ELIMINATION RH STG MANAGE BOWEL WITH ASSISTANCE Description STG Manage Bowel with Mod Assistance.   10/02/2017 1058 - Not Progressing by Brita Romp, RN Flowsheets Taken 10/02/2017 1054  STG: Pt will manage bowels with assistance 1-Total assistance Note Pt incontinent of bowel, requires 2 assist. RH BLADDER ELIMINATION RH STG MANAGE BLADDER WITH ASSISTANCE Description STG Manage Bladder With Mod Assistance   10/02/2017 1058 - Not Progressing by Brita Romp, RN Flowsheets Taken 10/02/2017 1054  STG: Pt will manage bladder with assistance 1-Total assistance Note Pt incontinent, requires total assistance. Uses Purewick at night RH SKIN INTEGRITY RH STG SKIN FREE OF INFECTION/BREAKDOWN Description No new breakdown with Mod assist/cues.    10/02/2017 1058 - Not Progressing by Brita Romp, RN Note No new skin breakdown. Healing Stage 2 on Sacrum RH COGNITION-NURSING RH STG ANTICIPATES NEEDS/CALLS FOR ASSIST W/ASSIST/CUES Description STG Anticipates Needs/Calls for Assist With Mod Assistance/Cues.  10/02/2017 1058 - Not Progressing by Brita Romp, RN Flowsheets Taken 10/02/2017 1058  STG: Anticipates needs/calls for assistance with assistance/cues 6-Modified independent

## 2017-10-02 NOTE — Progress Notes (Signed)
[place] PHYSICAL MEDICINE & REHABILITATION     PROGRESS NOTE  Subjective/Complaints:  Patient a bit restless overnight.  Short of breath at times with cough.  Secretions thick per RN  ROS: Limited due to cognitive/behavioral   Objective: Vital Signs: Blood pressure (!) 125/43, pulse 78, temperature 98 F (36.7 C), temperature source Oral, resp. rate 18, height 5\' 7"  (1.702 m), weight 42.1 kg (92 lb 13 oz), SpO2 97 %. Dg Chest 2 View  Result Date: 09/30/2017 CLINICAL DATA:  Pneumonia.  Emphysema. EXAM: CHEST  2 VIEW COMPARISON:  One-view chest x-ray 09/28/2017. FINDINGS: Heart is enlarged. Interstitial edema has increased slightly. Right upper lobe and lower lobe pneumonia has progressed. Left lower lobe airspace disease or atelectasis is noted. The visualized soft tissues and bony thorax are unremarkable. IMPRESSION: 1. Right upper and lower lobe pneumonia. 2. Cardiomegaly with increasing interstitial edema. Electronically Signed   By: [place] Morelle M.D.   On: 09/30/2017 09:32   No results for input(s): WBC, HGB, HCT, PLT in the last 72 hours. No results for input(s): NA, K, CL, GLUCOSE, BUN, CREATININE, CALCIUM in the last 72 hours.  Invalid input(s): CO CBG (last 3)  Recent Labs    10/02/17 0009 10/02/17 0414 10/02/17 0745  GLUCAP 123* 119* 117*    Wt Readings from Last 3 Encounters:  10/02/17 42.1 kg (92 lb 13 oz)  09/28/17 46.4 kg (102 lb 4.7 oz)  09/19/17 49.8 kg (109 lb 12.6 oz)    Physical Exam:  BP (!) 125/43 (BP Location: Left Arm)   Pulse 78   Temp 98 F (36.7 C) (Oral)   Resp 18   Ht 5\' 7"  (1.702 m)   Wt 42.1 kg (92 lb 13 oz)   SpO2 97%   BMI 14.54 kg/m  Constitutional: She appears well-developed. She appears cachectic. No distress.  HENT: Normocephalic and atraumatic.  Eyes: No discharge. Dysconjugate gaze with right eye in lateral field.  Cardiovascular: Irregularly irregular without JVD or murmur    Respiratory: scattered rhonchi no  distress. GI: Bowel sounds are normal. She exhibits no distension. +PEG. Musculoskeletal: She exhibits no edema or tenderness in extremities.  Neurological: She is alert. Extropia right eye (chronic)  Right facial droop with right inattention.  Severe dysarthria with limited verbal output, ?expressive aphasia.  Able  to follow simple one step motor commands.  Motor: RUE: 4/5 shoulder abduction, 4-/5 elbow flex/ext, 0/5 wrist extension, hand grip--no changes   RLE: 4+/5 proximal to distal LUE/LLE: 5/5 proximal to distal  Skin: Skin is warm and dry. She is not diaphoretic.  Psychiatric: Her mood appears anxious. Her speech is slurred. She is slowed  Assessment/Plan: 1. Functional deficits secondary to left MCA infarct which require 3+ hours per day of interdisciplinary therapy in a comprehensive inpatient rehab setting. Physiatrist is providing close team supervision and 24 hour management of active medical problems listed below. Physiatrist and rehab team continue to assess barriers to discharge/monitor patient progress toward functional and medical goals.  Function:  Bathing Bathing position   Position: Wheelchair/chair at sink  Bathing parts Body parts bathed by patient: Right arm, Chest, Abdomen, Right upper leg, Left upper leg, Front perineal area, Right lower leg, Left lower leg Body parts bathed by helper: Back, Buttocks, Left arm  Bathing assist Assist Level: Touching or steadying assistance(Pt > 75%)      Upper Body Dressing/Undressing Upper body dressing   What is the patient wearing?: Pull over shirt/dress     Pull over  shirt/dress - Perfomed by patient: Put head through opening Pull over shirt/dress - Perfomed by helper: Thread/unthread right sleeve, Thread/unthread left sleeve, Pull shirt over trunk        Upper body assist Assist Level: Touching or steadying assistance(Pt > 75%)      Lower Body Dressing/Undressing Lower body dressing   What is the patient  wearing?: Pants, Socks, Shoes       Pants- Performed by helper: Thread/unthread right pants leg, Thread/unthread left pants leg, Pull pants up/down   Non-skid slipper socks- Performed by helper: Don/doff right sock, Don/doff left sock   Socks - Performed by helper: Don/doff right sock, Don/doff left sock   Shoes - Performed by helper: Don/doff right shoe, Don/doff left shoe, Fasten right, Fasten left          Lower body assist Assist for lower body dressing: (MaxA)      Toileting Toileting Toileting activity did not occur: No continent bowel/bladder event        Toileting assist     Transfers Chair/bed transfer   Chair/bed transfer method: Stand pivot Chair/bed transfer assist level: Maximal assist (Pt 25 - 49%/lift and lower) Chair/bed transfer assistive device: Armrests     Locomotion Ambulation Ambulation activity did not occur: Safety/medical concerns   Max distance: 30' Assist level: Moderate assist (Pt 50 - 74%)   Wheelchair   Type: Manual Max wheelchair distance: 100' Assist Level: Maximal assistance (Pt 25 - 49%)  Cognition Comprehension Comprehension assist level: Understands basic 75 - 89% of the time/ requires cueing 10 - 24% of the time  Expression Expression assist level: Expresses basic 25 - 49% of the time/requires cueing 50 - 75% of the time. Uses single words/gestures.  Social Interaction Social Interaction assist level: Interacts appropriately 50 - 74% of the time - May be physically or verbally inappropriate.  Problem Solving Problem solving assist level: Solves basic 25 - 49% of the time - needs direction more than half the time to initiate, plan or complete simple activities  Memory Memory assist level: Recognizes or recalls 25 - 49% of the time/requires cueing 50 - 75% of the time    Medical Problem List and Plan: 1.  Hemiparesis, aphasia, dysphagia secondary to Left MCA infarct.   Continue CIR 2.  DVT Prophylaxis/Anticoagulation:  Pharmaceutical: Other (comment)--Eliquis 3. Pain Management: Tylenol prn  4. Mood: LCSW to follow for evaluation and support.  5. Neuropsych: This patient is not fully capable of making decisions on her own behalf. 6. Skin/Wound Care: Pressure relief measures.Maintain adequate hydration/nutritional status.  7. Fluids/Electrolytes/Nutrition: Adjust tube feeds to maintain adequate nutritional status. Consulted dietitian for input and adjustment.  8. RUL/RLL aspiration PNA   -Appreciate pulmonary consult.  Continue  Levaquin per recs.    Remains afebrile   -Scheduled Robitussin-DM to help with cough and secretions  9. ?CKD stage III: Monitor renal status with routine checks. Water flushes via PEG.    Creatinine 0.61 on 12/13    Appears to be controlled with adequate hydration   -Recheck Monday 10 Recent GIB: Back on Eliquis.    Hemoglobin 9.2 on 12/13   Continue to monitor 11. Seizure disorder: stable/controlled on Keppra. 12. A Fib: Monitor HR bid. Continue metoprolol and decrease ASA to 81 mg/day.  13. Thrombocytosis: Resolved   Likely reactive.    Will continue to monitor.    Platelets 310 on 12/13    -Recheck Monday 14. Dysphagia: NPO (bite trials with ST ongoing) and tolerating tube feeds 15.  Protein calorie malnutrition: Chronic with baseline albumin around 2.5 in the past 6 months.    Pre-albumin 16.9 on 12/13.    Continue to monitor 16. COPD (per X ray)/RUL bronchogenic lung CA: completed XRT 06/2017 17. Hypokalemia:    Potassium 4.3 on 12/13, after supplementation   Continue to monitor   -Recheck Monday  LOS (Days) 4 A FACE TO FACE EVALUATION WAS PERFORMED  Shana Younge T 10/02/2017 8:07 AM

## 2017-10-02 NOTE — Progress Notes (Signed)
+/-   sleep. Restless/anxious at times. Using call light for assistance.  Monica Martinez, son called at 71 to inquire about patient. Requesting PTA "xanax 0.25mg 's" be restarted.  Congested cough.  Scattered rhonchi throughout. C/O feeling SOB this AM, RT notified PRN neb treatment given. Turned and repositioned every 2 hours. Foam dressing to sacrum. Bilateral prevalon boots applied at HS. Osmolite via PEG tube. Purewick at [place]. Incontinent of stool. Patrici Ranks A

## 2017-10-03 ENCOUNTER — Inpatient Hospital Stay (HOSPITAL_COMMUNITY): Payer: Medicare Other

## 2017-10-03 ENCOUNTER — Inpatient Hospital Stay (HOSPITAL_COMMUNITY): Payer: Medicare Other | Admitting: Speech Pathology

## 2017-10-03 ENCOUNTER — Inpatient Hospital Stay (HOSPITAL_COMMUNITY): Payer: Medicare Other | Admitting: Occupational Therapy

## 2017-10-03 DIAGNOSIS — D62 Acute posthemorrhagic anemia: Secondary | ICD-10-CM

## 2017-10-03 DIAGNOSIS — D72829 Elevated white blood cell count, unspecified: Secondary | ICD-10-CM

## 2017-10-03 LAB — CBC WITH DIFFERENTIAL/PLATELET
Basophils Absolute: 0 10*3/uL (ref 0.0–0.1)
Basophils Relative: 0 %
EOS ABS: 0.2 10*3/uL (ref 0.0–0.7)
EOS PCT: 2 %
HCT: 31.8 % — ABNORMAL LOW (ref 36.0–46.0)
Hemoglobin: 10 g/dL — ABNORMAL LOW (ref 12.0–15.0)
LYMPHS ABS: 1 10*3/uL (ref 0.7–4.0)
Lymphocytes Relative: 9 %
MCH: 30.5 pg (ref 26.0–34.0)
MCHC: 31.4 g/dL (ref 30.0–36.0)
MCV: 97 fL (ref 78.0–100.0)
MONO ABS: 0.9 10*3/uL (ref 0.1–1.0)
Monocytes Relative: 8 %
Neutro Abs: 8.6 10*3/uL — ABNORMAL HIGH (ref 1.7–7.7)
Neutrophils Relative %: 81 %
PLATELETS: 260 10*3/uL (ref 150–400)
RBC: 3.28 MIL/uL — AB (ref 3.87–5.11)
RDW: 16.5 % — AB (ref 11.5–15.5)
WBC: 10.7 10*3/uL — AB (ref 4.0–10.5)

## 2017-10-03 LAB — BASIC METABOLIC PANEL
Anion gap: 7 (ref 5–15)
BUN: 18 mg/dL (ref 6–20)
CHLORIDE: 101 mmol/L (ref 101–111)
CO2: 28 mmol/L (ref 22–32)
CREATININE: 0.68 mg/dL (ref 0.44–1.00)
Calcium: 8.4 mg/dL — ABNORMAL LOW (ref 8.9–10.3)
GFR calc Af Amer: >60 mL/min (ref >60)
Glucose, Bld: 110 mg/dL — ABNORMAL HIGH (ref 65–99)
Potassium: 4.4 mmol/L (ref 3.5–5.1)
SODIUM: 136 mmol/L (ref 135–145)

## 2017-10-03 LAB — URINALYSIS, ROUTINE W REFLEX MICROSCOPIC
Bilirubin Urine: NEGATIVE
GLUCOSE, UA: NEGATIVE mg/dL
Hgb urine dipstick: NEGATIVE
KETONES UR: NEGATIVE mg/dL
LEUKOCYTES UA: NEGATIVE
Nitrite: NEGATIVE
PH: 7 (ref 5.0–8.0)
Protein, ur: NEGATIVE mg/dL
SPECIFIC GRAVITY, URINE: 1.013 (ref 1.005–1.030)

## 2017-10-03 LAB — GLUCOSE, CAPILLARY
GLUCOSE-CAPILLARY: 104 mg/dL — AB (ref 65–99)
GLUCOSE-CAPILLARY: 111 mg/dL — AB (ref 65–99)
GLUCOSE-CAPILLARY: 95 mg/dL (ref 65–99)
Glucose-Capillary: 100 mg/dL — ABNORMAL HIGH (ref 65–99)
Glucose-Capillary: 86 mg/dL (ref 65–99)
Glucose-Capillary: 103 mg/dL — ABNORMAL HIGH (ref 65–99)

## 2017-10-03 MED ORDER — LEVOFLOXACIN 250 MG PO TABS
250.0000 mg | ORAL_TABLET | Freq: Every day | ORAL | Status: DC
  Administered 2017-10-04 – 2017-10-07 (×4): 250 mg
  Filled 2017-10-03 (×4): qty 1

## 2017-10-03 NOTE — Progress Notes (Signed)
Speech Language Pathology Daily Session Note  Patient Details  Name: Kayla Kent MRN: [id number] Date of Birth: 07-07-1932  Today's Date: 10/03/2017 SLP Individual Time: 0830-0900 SLP Individual Time Calculation (min): 30 min  Short Term Goals: Week 1: SLP Short Term Goal 1 (Week 1): Pt will name familiar objects with mod assist multimodal cues to recognize and correct verbal errors.   SLP Short Term Goal 2 (Week 1): Pt will utilize a basic, text based augmented communication system via pointing to convey immediate needs and wants in ~50% of opportunities with max assist multimodal cues SLP Short Term Goal 3 (Week 1): Pt will slow rate and increase vocal intensity to achieve intelligibility at the word level for ~50% accuracy with max assist multimodal cues.   SLP Short Term Goal 4 (Week 1): Pt will consume therapeutic trials of honey thick liquids and purees with minimal overt s/s of aspiration and min cues for use of swallowing precautions over 3 consecutive targeted sessions prior to repeat objective study.   SLP Short Term Goal 5 (Week 1): Pt will sustain her attention to basic, familiar tasks for 5 minute intervals with mod verbal cues for redirection.    Skilled Therapeutic Interventions: Skilled treatment session focused on dysphagia and cognitive goals. Upon arrival, patient was awake while supine in bed. Patient with baseline wet, congested cough with most recent chest x-ray (12/14) reporting upper and lower lobe PNA. Therefore, trials not attempted this session due to safety concerns. However, oral care was performed by SLP via the suction toothbrush in order to remove thick secretions and decrease bacterial lode in oral cavity. SLP also facilitated session by focusing on functional communication with use of letter/communication boards. Patient able to identify functional phrases with 100% accuracy and answer basic biographical questions at the word level with 100% accuracy. However,  patient able to spell words with only up to ~30-50% accuracy but clinician able to infer appropriate answers. Patient left upright in bed with all needs within reach. Continue with current plan of care.      Function:  Eating Eating Eating activity did not occur: Safety/medical concerns               Cognition Comprehension Comprehension assist level: Understands basic 75 - 89% of the time/ requires cueing 10 - 24% of the time  Expression Expression assistive device: Communication board Expression assist level: Expresses basic 25 - 49% of the time/requires cueing 50 - 75% of the time. Uses single words/gestures.  Social Interaction Social Interaction assist level: Interacts appropriately 50 - 74% of the time - May be physically or verbally inappropriate.  Problem Solving Problem solving assist level: Solves basic 25 - 49% of the time - needs direction more than half the time to initiate, plan or complete simple activities  Memory Memory assist level: Recognizes or recalls 25 - 49% of the time/requires cueing 50 - 75% of the time    Pain No/Denies Pain   Therapy/Group: Individual Therapy  Haruna Rohlfs 10/03/2017, 2:58 PM

## 2017-10-03 NOTE — Consult Note (Signed)
   [facility] CM Inpatient Consult   10/03/2017  Kayla Kent 05-01-1932 [id number]    [facility] Care Management follow up.   Kayla Kent is on inpatient rehab unit currently.   Spoke with inpatient rehab LCSW to discuss that writer will continue to follow for disposition plans.   Marthenia Rolling, [name], RN,[name]

## 2017-10-03 NOTE — Progress Notes (Signed)
Kayla Kent PHYSICAL MEDICINE & REHABILITATION     PROGRESS NOTE  Subjective/Complaints:  Pt seen laying in bed this AM.  She appears to indicate she slept well overnight and had a good weekend.  She would like to know what time her therapies begin.   ROS: Appears to deny CP, SOB, N/V/D.  Objective: Vital Signs: Blood pressure (!) 127/48, pulse 77, temperature 98.2 F (36.8 C), temperature source Oral, resp. rate 18, height 5\' 7"  (1.702 m), weight 43.8 kg (96 lb 9 oz), SpO2 97 %. No results found. Recent Labs    10/03/17 0521  WBC 10.7*  HGB 10.0*  HCT 31.8*  PLT 260   Recent Labs    10/03/17 0521  NA 136  K 4.4  CL 101  GLUCOSE 110*  BUN 18  CREATININE 0.68  CALCIUM 8.4*   CBG (last 3)  Recent Labs    10/02/17 2352 10/03/17 0352 10/03/17 0818  GLUCAP 127* 100* 104*    Wt Readings from Last 3 Encounters:  10/03/17 43.8 kg (96 lb 9 oz)  09/28/17 46.4 kg (102 lb 4.7 oz)  09/19/17 49.8 kg (109 lb 12.6 oz)    Physical Exam:  BP (!) 127/48 (BP Location: Left Arm)   Pulse 77   Temp 98.2 F (36.8 C) (Oral)   Resp 18   Ht 5\' 7"  (1.702 m)   Wt 43.8 kg (96 lb 9 oz)   SpO2 97%   BMI 15.12 kg/m  Constitutional: She appears well-developed. She appears cachectic. No distress.  HENT: Normocephalic and atraumatic.  Eyes: No discharge. Dysconjugate gaze with right eye in lateral field (unchanged).  Cardiovascular: Irregularly irregular without JVD. Respiratory: Scattered rhonchi. Unlabored. GI: Bowel sounds are normal. She exhibits no distension. +PEG. Musculoskeletal: She exhibits no edema or tenderness in extremities.  Neurological: She is alert. Right facial droop with right inattention.  Severe dysarthria with limited verbal output, ?expressive aphasia.  Able  to follow simple one step motor commands.  Motor: RUE: 4/5 shoulder abduction, 4-/5 elbow flex/ext, 0/5 wrist extension, hand grip (stable). RLE: 4+/5 proximal to distal LUE/LLE: 5/5 proximal to distal   Skin: Skin is warm and dry. She is not diaphoretic.  Psychiatric: Her mood appears anxious. Her speech is slurred. She is slowed  Assessment/Plan: 1. Functional deficits secondary to left MCA infarct which require 3+ hours per day of interdisciplinary therapy in a comprehensive inpatient rehab setting. Physiatrist is providing close team supervision and 24 hour management of active medical problems listed below. Physiatrist and rehab team continue to assess barriers to discharge/monitor patient progress toward functional and medical goals.  Function:  Bathing Bathing position   Position: Wheelchair/chair at sink  Bathing parts Body parts bathed by patient: Right arm, Chest, Abdomen, Right upper leg, Left upper leg, Front perineal area, Right lower leg, Left lower leg Body parts bathed by helper: Back, Buttocks, Left arm  Bathing assist Assist Level: Touching or steadying assistance(Pt > 75%)      Upper Body Dressing/Undressing Upper body dressing   What is the patient wearing?: Pull over shirt/dress     Pull over shirt/dress - Perfomed by patient: Put head through opening Pull over shirt/dress - Perfomed by helper: Thread/unthread right sleeve, Thread/unthread left sleeve, Pull shirt over trunk        Upper body assist Assist Level: Touching or steadying assistance(Pt > 75%)      Lower Body Dressing/Undressing Lower body dressing   What is the patient wearing?: Pants, Socks, Shoes  Pants- Performed by helper: Thread/unthread right pants leg, Thread/unthread left pants leg, Pull pants up/down   Non-skid slipper socks- Performed by helper: Don/doff right sock, Don/doff left sock   Socks - Performed by helper: Don/doff right sock, Don/doff left sock   Shoes - Performed by helper: Don/doff right shoe, Don/doff left shoe, Fasten right, Fasten left          Lower body assist Assist for lower body dressing: (MaxA)      Toileting Toileting Toileting activity did not  occur: No continent bowel/bladder event        Toileting assist     Transfers Chair/bed transfer   Chair/bed transfer method: Stand pivot Chair/bed transfer assist level: Maximal assist (Pt 25 - 49%/lift and lower) Chair/bed transfer assistive device: Armrests     Locomotion Ambulation Ambulation activity did not occur: Safety/medical concerns   Max distance: 30' Assist level: Moderate assist (Pt 50 - 74%)   Wheelchair   Type: Manual Max wheelchair distance: 100' Assist Level: Maximal assistance (Pt 25 - 49%)  Cognition Comprehension Comprehension assist level: Understands basic 75 - 89% of the time/ requires cueing 10 - 24% of the time  Expression Expression assist level: Expresses basic 25 - 49% of the time/requires cueing 50 - 75% of the time. Uses single words/gestures.  Social Interaction Social Interaction assist level: Interacts appropriately 50 - 74% of the time - May be physically or verbally inappropriate.  Problem Solving Problem solving assist level: Solves basic 25 - 49% of the time - needs direction more than half the time to initiate, plan or complete simple activities  Memory Memory assist level: Recognizes or recalls 25 - 49% of the time/requires cueing 50 - 75% of the time    Medical Problem List and Plan: 1.  Hemiparesis, aphasia, dysphagia secondary to Left MCA infarct.   Continue CIR 2.  DVT Prophylaxis/Anticoagulation: Pharmaceutical: Other (comment)--Eliquis 3. Pain Management: Tylenol prn  4. Mood: LCSW to follow for evaluation and support.  5. Neuropsych: This patient is not fully capable of making decisions on her own behalf. 6. Skin/Wound Care: Pressure relief measures.Maintain adequate hydration/nutritional status.  7. Fluids/Electrolytes/Nutrition: Adjust tube feeds to maintain adequate nutritional status. Consulted dietitian for input and adjustment.    BMP within acceptable range on 12/17 8. RUL/RLL aspiration PNA   Appreciate pulmonary  consult.  Continue  Levaquin per recs.    Remains afebrile   -Scheduled Robitussin-DM to help with cough and secretions  9. ?CKD stage III: Monitor renal status with routine checks. Water flushes via PEG.    Creatinine 0.61 on 12/13    Appears to be controlled with adequate hydration 10 Recent GIB: Back on Eliquis.    Hemoglobin 10.0 on 12/17   Continue to monitor 11. Seizure disorder: stable/controlled on Keppra. 12. A Fib: Monitor HR bid. Continue metoprolol and decrease ASA to 81 mg/day.  13. Thrombocytosis: Resolved   Likely reactive.    Will continue to monitor.  14. Dysphagia: NPO (bite trials with ST ongoing) and tolerating tube feeds 15. Protein calorie malnutrition: Chronic with baseline albumin around 2.5 in the past 6 months.    Pre-albumin 16.9 on 12/13.    Continue to monitor 16. COPD (per X ray)/RUL bronchogenic lung CA: completed XRT 06/2017 17. Hypokalemia:    Potassium 4.4 on 12/17, after supplementation   Continue to monitor 18. Leukocytosis  WBCs 10.7 on 12/17  Afebrile  Likely from PNA  Cont to monitor  LOS (Days) 5 A FACE TO  FACE EVALUATION WAS PERFORMED   Lorie Phenix 10/03/2017 8:50 AM

## 2017-10-03 NOTE — Progress Notes (Signed)
Congested, wet cough. Voice wet at times.  Lungs diminished. RT up at 2045  and suctioned patient for thick secretions. Incontinent B&B, incontinent stool requiring total assist with linen change and hygiene. MASD to peri area-barrier cream applied. New foam dressing applied to sacrum, no open areas noted, but very red. Bilateral heels boggy, prevalon boots in place. Intermittent drainage to right eye. Not as restless tonight.Patrici Ranks A

## 2017-10-03 NOTE — Progress Notes (Signed)
PHARMACY NOTE:  ANTIMICROBIAL RENAL DOSAGE ADJUSTMENT  Current antimicrobial regimen includes a mismatch between antimicrobial dosage and estimated renal function.  As per policy approved by the Pharmacy & Therapeutics and Medical Executive Committees, the antimicrobial dosage will be adjusted accordingly.  Current antimicrobial dosage:  Levaquin 500 mg po daily  Indication: Pneumonia  Renal Function:   Estimated Creatinine Clearance: 36.8 mL/min (by C-G formula based on SCr of 0.68 mg/dL). []      On intermittent HD, scheduled: []      On CRRT    Antimicrobial dosage has been changed to:  Levaquin 250 mg daily  Additional comments: Has been on Levaquin since 12/3. Please consider stop antibiotics   Thank you for allowing pharmacy to be a part of this patient's care.  Manley Mason, [facility] 10/03/2017 1:57 PM

## 2017-10-03 NOTE — Progress Notes (Signed)
Physical Therapy Session Note  Patient Details  Name: Kayla Kent MRN: [id number] Date of Birth: 1932/03/31  Today's Date: 10/03/2017 PT Individual Time: 0092-3300, 7622-6333 PT Individual Time Calculation (min): 59 min , 43 min   Short Term Goals: Week 1:  PT Short Term Goal 1 (Week 1): Pt will maintain static sitting balance w/ supervision PT Short Term Goal 2 (Week 1): Pt will transfer bed<>chair w/ mod assist PT Short Term Goal 3 (Week 1): Pt will self-propel w/c using L hemi technique w/ mod assist PT Short Term Goal 4 (Week 1): Pt will tolerate sitting OOB in between therapies for 2 hours at a time  PT Short Term Goal 5 (Week 1): Pt will maintain neutral trunk alignment in sitting or standing w/ minimal cues in 50% of trials   Skilled Therapeutic Interventions/Progress Updates:    Session 1: Pt supine in bed upon PT arrival, agreeable to therapy tx and denies pain. RN disconnected pt from tube feeds. Therapist assisted pt with lower body dressing, pt rolled each direction x 2 with min assist while therapist pulls pants over hips. Pt transferred from supine>sidelying>sitting with min assist, verbal cues for techniques. Pt performed squat pivot transfer from bed>w/c with min assist. Pt transported to the gym. Session focused on standing balance, postural control and minimizing R posteriolateral lean. Pt transferred from w/c<>mat squat pivot with min assist. Pt performed x 3 sit<>stands with mod assist, increased R posteriolateral pushing in standing, verbal and tactile cues to correct. Pt worked on standing and leaning to the left to touch L shoulder to wall, mirror for feedback. Pt worked on standing and reaching for cups/horseshoes to encourage weightshift anterior and to the L side. Pts pushing increased with fatigue, at some points lifting R foot off the floor and falling posteriorly back into her w/c. Pt requiring mod-max assist in order to maintain standing balance secondary to  pushing. Pt left seated in w/c at end of session with QRB in place and needs in reach.   Session 2: Pt received from OT in therapy gym, agreeable to therapy tx and denies pain. Pt performed x 3 sit<>stands from the mat with improved postural control and decreased posterior lean. Pt worked on dynamic standing balance while performing card matching activity x 2 trials and while reaching/throwing bean bags into basket x 2 trials, working on minimizing posterior lean. Pt performed pre-gait stepping to/from numbered dots on the floor without UE support, mod assist. Pt ambulated x 65 ft without AD, mod assist and +2 for equipment management (tube feed), verbal cues for increased step length. Pt transported back to room in w/c. Pt transferred w/c>bed min assist squat pivot. Pt transferred sitting>supine mod assist. Pt left supine in bed with needs in reach.    Therapy Documentation Precautions:  Precautions Precautions: Fall Precaution Comments: R hemiplegia Restrictions Weight Bearing Restrictions: No   See Function Navigator for Current Functional Status.   Therapy/Group: Individual Therapy  Netta Corrigan, PT, DPT 10/03/2017, 8:03 AM

## 2017-10-03 NOTE — Progress Notes (Signed)
Speech Language Pathology Daily Session Note  Patient Details  Name: Kayla Kent MRN: [id number] Date of Birth: Nov 01, 1931  Today's Date: 10/03/2017 SLP Individual Time: 1115-1200 SLP Individual Time Calculation (min): 45 min  Short Term Goals: Week 1: SLP Short Term Goal 1 (Week 1): Pt will name familiar objects with mod assist multimodal cues to recognize and correct verbal errors.   SLP Short Term Goal 2 (Week 1): Pt will utilize a basic, text based augmented communication system via pointing to convey immediate needs and wants in ~50% of opportunities with max assist multimodal cues SLP Short Term Goal 3 (Week 1): Pt will slow rate and increase vocal intensity to achieve intelligibility at the word level for ~50% accuracy with max assist multimodal cues.   SLP Short Term Goal 4 (Week 1): Pt will consume therapeutic trials of honey thick liquids and purees with minimal overt s/s of aspiration and min cues for use of swallowing precautions over 3 consecutive targeted sessions prior to repeat objective study.   SLP Short Term Goal 5 (Week 1): Pt will sustain her attention to basic, familiar tasks for 5 minute intervals with mod verbal cues for redirection.    Skilled Therapeutic Interventions: Skilled treatment session focused on communication goals. SLP facilitated session by providing Max A cues to increase accuracy of pt's use of communication board to ~ 10%. Pt attempted to spell her children's and grandchildren's names. Pt spelled JA for [place], AS for [place], [facility] for [place] and unable to start the name of her son. Pt able to communicate via gestures and yes/no that she wanted to lay down after ST sessions and she wanted to look at her schedule. Pt transferred to bed and left with bed alarm on and all needs within reach. Continue per current plan of care.      Function:    Cognition Comprehension Comprehension assist level: Understands basic 75 - 89% of the time/ requires cueing 10 -  24% of the time  Expression Expression assistive device: Communication board Expression assist level: Expresses basic 25 - 49% of the time/requires cueing 50 - 75% of the time. Uses single words/gestures.  Social Interaction Social Interaction assist level: Interacts appropriately 50 - 74% of the time - May be physically or verbally inappropriate.  Problem Solving Problem solving assist level: Solves basic 25 - 49% of the time - needs direction more than half the time to initiate, plan or complete simple activities  Memory Memory assist level: Recognizes or recalls 25 - 49% of the time/requires cueing 50 - 75% of the time    Pain Pain Assessment Pain Assessment: No/denies pain  Therapy/Group: Individual Therapy  Elie Leppo 10/03/2017, 12:20 PM

## 2017-10-03 NOTE — Progress Notes (Addendum)
Occupational Therapy Session Note  Patient Details  Name: Kayla Kent MRN: [id number] Date of Birth: [birth year]  Today's Date: 10/03/2017 OT Individual Time: 1449-1530 OT Individual Time Calculation (min): 41 min    Short Term Goals: Week 1:  OT Short Term Goal 1 (Week 1): Pt will complete bathing sit to stand with mod assist. OT Short Term Goal 2 (Week 1): Pt will transfer to the 3:1 with mod assist stand pivot.   OT Short Term Goal 3 (Week 1): Pt will donn a pullover shirt with min assist and mod demonstrational cueing.  OT Short Term Goal 4 (Week 1): Pt will perform LB dressing with mod assist for gripper socks and pull up pants.   OT Short Term Goal 5 (Week 1): Pt will use the RUE as a stabilizer with max hand over hand assistance.    Skilled Therapeutic Interventions/Progress Updates:    Pt presents supine in bed with no c/o pain, agreeable to OT tx session, Pt's daughter present. Pt completes bed mobility with minA to sit EOB, increased time and verbal cues for technique. Seated EOB Pt dons shoes with MinA, increased time and verbal cues, and Min-ModA for support while Pt reaches towards feet, requires assist for fastening laces. Pt's dtr inquiring about adaptive laces, educated on use of elastic laces and issued to dtr to increase Pt's independence during task completion. Pt completes stand pivot transfer EOB>w/c to the R with ModA. Stands at sink to wash hands with MinA for sit<>stand, ModA to maintain standing balance and verbal cues for task completion; Pt initially initiates washing face vs hands in standing. Educated Pt on use of LUE to wash RUE with Pt return demonstrating in sitting. Transported Pt total assist to therapy gym, MaxA for transfer w/c>mat table to the L. Pt engaged in AA/PROM to RUE across all planes and verbal cues for proper body mechanics as Pt attempting to use compensatory techniques when completing movements. Ended session with handoff to PT to begin session.    Therapy Documentation Precautions:  Precautions Precautions: Fall Precaution Comments: R hemiplegia Restrictions Weight Bearing Restrictions: No    See Function Navigator for Current Functional Status.   Therapy/Group: Individual Therapy  Raymondo Band 10/03/2017, 4:34 PM

## 2017-10-04 ENCOUNTER — Inpatient Hospital Stay (HOSPITAL_COMMUNITY): Payer: Self-pay | Admitting: Physical Therapy

## 2017-10-04 ENCOUNTER — Inpatient Hospital Stay (HOSPITAL_COMMUNITY): Payer: Self-pay | Admitting: Speech Pathology

## 2017-10-04 ENCOUNTER — Inpatient Hospital Stay (HOSPITAL_COMMUNITY): Payer: Self-pay

## 2017-10-04 ENCOUNTER — Inpatient Hospital Stay (HOSPITAL_COMMUNITY): Payer: Medicare Other | Admitting: Occupational Therapy

## 2017-10-04 DIAGNOSIS — I4891 Unspecified atrial fibrillation: Secondary | ICD-10-CM

## 2017-10-04 LAB — GLUCOSE, CAPILLARY
GLUCOSE-CAPILLARY: 90 mg/dL (ref 65–99)
GLUCOSE-CAPILLARY: 101 mg/dL — AB (ref 65–99)
Glucose-Capillary: 125 mg/dL — ABNORMAL HIGH (ref 65–99)
Glucose-Capillary: 122 mg/dL — ABNORMAL HIGH (ref 65–99)
Glucose-Capillary: 78 mg/dL (ref 65–99)
Glucose-Capillary: 123 mg/dL — ABNORMAL HIGH (ref 65–99)

## 2017-10-04 MED ORDER — ALPRAZOLAM 0.25 MG PO TABS
0.2500 mg | ORAL_TABLET | Freq: Every evening | ORAL | Status: DC | PRN
  Administered 2017-10-05 – 2017-10-08 (×4): 0.25 mg via ORAL
  Filled 2017-10-04 (×4): qty 1

## 2017-10-04 NOTE — Plan of Care (Signed)
Patient does not call for assist . Q hour rounding required and Instructed patient to call if family were not in room.  Mliss Sax

## 2017-10-04 NOTE — Progress Notes (Signed)
Talk with daughter and she is adamantly wants xanax given to her mother at night for sleep. She says she has been on if for 40 years and wants to keep medicine in system.

## 2017-10-04 NOTE — Progress Notes (Signed)
Speech Language Pathology Daily Session Note  Patient Details  Name: Kayla Kent MRN: [id number] Date of Birth: [birth year]  Today's Date: 10/04/2017 SLP Individual Time: 1300-1400 SLP Individual Time Calculation (min): 60 min  Short Term Goals: Week 1: SLP Short Term Goal 1 (Week 1): Pt will name familiar objects with mod assist multimodal cues to recognize and correct verbal errors.   SLP Short Term Goal 2 (Week 1): Pt will utilize a basic, text based augmented communication system via pointing to convey immediate needs and wants in ~50% of opportunities with max assist multimodal cues SLP Short Term Goal 3 (Week 1): Pt will slow rate and increase vocal intensity to achieve intelligibility at the word level for ~50% accuracy with max assist multimodal cues.   SLP Short Term Goal 4 (Week 1): Pt will consume therapeutic trials of honey thick liquids and purees with minimal overt s/s of aspiration and min cues for use of swallowing precautions over 3 consecutive targeted sessions prior to repeat objective study.   SLP Short Term Goal 5 (Week 1): Pt will sustain her attention to basic, familiar tasks for 5 minute intervals with mod verbal cues for redirection.    Skilled Therapeutic Interventions:  Pt was seen for skilled ST targeting goals for speech and swallowing.  Pt was able to generate the names of items that were opposite of a given item, identify objects from description, and provide associated words for a given item when provided with a choice of three for ~80% accuracy with mod-max assist verbal cues for intelligibility and correction of verbal errors.  Pt was able to complete 10 volitional swallows with min-mod verbal cues and more than a reasonable amount of time.  SLP attempted x1 bolus of honey thick liquids via teaspoon which was completely lost anteriorly.  Pt declined further trials.  Pt returned to her room and left in wheelchair with call bell within reach and quick release belt  donned.  Continue per current plan of care.    Function:  Eating Eating   Modified Consistency Diet: (x1 trial of honey thick liquids via teaspoon ) Eating Assist Level: Helper feeds patient           Cognition Comprehension Comprehension assist level: Understands basic 75 - 89% of the time/ requires cueing 10 - 24% of the time  Expression Expression assistive device: Communication board Expression assist level: Expresses basic 50 - 74% of the time/requires cueing 25 - 49% of the time. Needs to repeat parts of sentences.  Social Interaction Social Interaction assist level: Interacts appropriately 75 - 89% of the time - Needs redirection for appropriate language or to initiate interaction.  Problem Solving Problem solving assist level: Solves basic 25 - 49% of the time - needs direction more than half the time to initiate, plan or complete simple activities  Memory Memory assist level: Recognizes or recalls 25 - 49% of the time/requires cueing 50 - 75% of the time    Pain Pain Assessment Pain Assessment: No/denies pain  Therapy/Group: Individual Therapy  Lanora Reveron, Selinda Orion 10/04/2017, 2:15 PM

## 2017-10-04 NOTE — Progress Notes (Signed)
Physical Therapy Session Note  Patient Details  Name: Kayla Kent MRN: [id number] Date of Birth: 1932/01/27  Today's Date: 10/04/2017 PT Individual Time: 1000-1055 PT Individual Time Calculation (min): 55 min   Short Term Goals: Week 1:  PT Short Term Goal 1 (Week 1): Pt will maintain static sitting balance w/ supervision PT Short Term Goal 2 (Week 1): Pt will transfer bed<>chair w/ mod assist PT Short Term Goal 3 (Week 1): Pt will self-propel w/c using L hemi technique w/ mod assist PT Short Term Goal 4 (Week 1): Pt will tolerate sitting OOB in between therapies for 2 hours at a time  PT Short Term Goal 5 (Week 1): Pt will maintain neutral trunk alignment in sitting or standing w/ minimal cues in 50% of trials   Skilled Therapeutic Interventions/Progress Updates:    Pt seated in w/c upon PT arrival, agreeable to therapy tx and denies pain. Pt transported to gym in w/c total assist. Pt transferred from w/c<>mat squat pivot with min assist, verbal cues for technique. Pt worked on dynamic standing balance and cognitive remediation in order to stand and retrieve playing cards as called, pt able to retrieve 9/9 correctly, increased R posteriolateral lean in standing. Pt worked on ambulated without AD, x 30 ft, x 30 ft, x 65 ft and x 65 ft with mod assist and without AD, pt given verbal cues for increased R step length, manual facilitation to correct R lateral lean. Pt worked on dynamic sitting balance and L lateral leans in order to reach for horseshoes, x 2 trials. Pt ambulated from mat back to w/c x 50 ft with mod assist. Pt transported back to room in w/c. Pt transferred w/c>bed min assist squat pivot. Pt transferred sitting>supine mod assist, left supine in bed with needs in reach.   Therapy Documentation Precautions:  Precautions Precautions: Fall Precaution Comments: R hemiplegia Restrictions Weight Bearing Restrictions: No   See Function Navigator for Current Functional  Status.   Therapy/Group: Individual Therapy  Netta Corrigan, PT, DPT 10/04/2017, 7:48 AM

## 2017-10-04 NOTE — Progress Notes (Signed)
Occupational Therapy Session Note  Patient Details  Name: Kayla Kent MRN: [id number] Date of Birth: 1931/11/14  Today's Date: 10/04/2017 OT Individual Time: 0800-0902 OT Individual Time Calculation (min): 62 min    Short Term Goals: Week 1:  OT Short Term Goal 1 (Week 1): Pt will complete bathing sit to stand with mod assist. OT Short Term Goal 2 (Week 1): Pt will transfer to the 3:1 with mod assist stand pivot.   OT Short Term Goal 3 (Week 1): Pt will donn a pullover shirt with min assist and mod demonstrational cueing.  OT Short Term Goal 4 (Week 1): Pt will perform LB dressing with mod assist for gripper socks and pull up pants.   OT Short Term Goal 5 (Week 1): Pt will use the RUE as a stabilizer with max hand over hand assistance.    Skilled Therapeutic Interventions/Progress Updates:   Pt transferred from the bed to wheelchair with mod assist to start session.  Worked on bathing and dressing sit to stand at the sink.  Min assist for UB bathing with max demonstrational cueing and max assist for integration of the RUE into functional task for stabilizing washcloth and washing the left arm.  She was able to complete sit to stand during LB selfcare with min assist but needed mod assist to sustained standing balance secondary to pushing to the left in standing.  Therapist provided total assist for washing peri area secondary to bowel incontinence.  Max assist for sequencing and completing LB dressing secondary to decreased ability to reach her LLE efficiently.  She was able to [employer] with mod assist with therapist assisting with threading the right first and then pt completing the LUE and pulling over her head.  Therapist guided her LUE to assist with pulling it over the shoulder and then down over her back.  She completed brushing her hair with setup before being transition to the side of bed in the wheelchair.  She was left with chair alarm and belt in place as well as soft touch  call bell.    Therapy Documentation Precautions:  Precautions Precautions: Fall Precaution Comments: R hemiplegia Restrictions Weight Bearing Restrictions: No  Pain: Pain Assessment Pain Assessment: No/denies pain ADL: See Function Navigator for Current Functional Status.   Therapy/Group: Individual Therapy  Jaydan Chretien OTR/L 10/04/2017, 12:04 PM

## 2017-10-04 NOTE — Progress Notes (Signed)
Physical Therapy Session Note  Patient Details  Name: Kayla Kent MRN: [id number] Date of Birth: 10/25/1931  Today's Date: 10/04/2017 PT Individual Time: 1610-1638 PT Individual Time Calculation (min): 28 min   Short Term Goals: Week 1:  PT Short Term Goal 1 (Week 1): Pt will maintain static sitting balance w/ supervision PT Short Term Goal 2 (Week 1): Pt will transfer bed<>chair w/ mod assist PT Short Term Goal 3 (Week 1): Pt will self-propel w/c using L hemi technique w/ mod assist PT Short Term Goal 4 (Week 1): Pt will tolerate sitting OOB in between therapies for 2 hours at a time  PT Short Term Goal 5 (Week 1): Pt will maintain neutral trunk alignment in sitting or standing w/ minimal cues in 50% of trials   Skilled Therapeutic Interventions/Progress Updates:   Pt supine upon arrival and agreeable to therapy, no c/o pain. Pt required maximal encouragement to wake up and remain awake for start of therapy session despite this. Transferred to EOB w/ mod assist and pt maintained static sitting while therapist provided total assist to don shoes and jacket. Ambulated 15' into hallway w/ mod assist, manual facilitation for weight shifting during gait w/ unilateral HHA. Pt w/ increased work of breathing w/ gait. Instructed and performed w/c mobility using BLEs to work on independence w/ method of locomotion while facilitating reciprocal movement pattern. Performed 150' w/ supervision and increased time, 2 bouts. Returned to room and educated pt on importance of spending time OOB to increased tolerance to upright and, per verbal RN report, pt is having trouble sleeping at night. Pt declined despite multiple attempts. Transferred back to EOB via stand pivot and to supine w/ mod assist. Ended session in supine, call bell within reach and all needs met.   Therapy Documentation Precautions:  Precautions Precautions: Fall Precaution Comments: R hemiplegia Restrictions Weight Bearing Restrictions:  No Vital Signs: Therapy Vitals Temp: 98 F (36.7 C) Temp Source: Oral Pulse Rate: 93 Resp: 14 BP: (!) 125/48 Patient Position (if appropriate): Sitting Oxygen Therapy SpO2: 98 % O2 Device: Not Delivered Pain: Pain Assessment Pain Assessment: No/denies pain  See Function Navigator for Current Functional Status.   Therapy/Group: Individual Therapy   K Arnette 10/04/2017, 4:40 PM  

## 2017-10-04 NOTE — Progress Notes (Signed)
Have spoken to daugher on couple of occasions about need to avoid sedating medications. Per nursing, family is concerned about her sleep hygiene and does not want any meds other than Xanax --will order on prn basis and monitor for SE.

## 2017-10-04 NOTE — Progress Notes (Signed)
[facility] PHYSICAL MEDICINE & REHABILITATION     PROGRESS NOTE  Subjective/Complaints:  Patient seen lying in bed this morning. She indicates she slept well overnight. She has a wet cough and is unable to handle her secretions. She is unable to use the suction either.  ROS: Appears to deny CP, SOB, N/V/D.  Objective: Vital Signs: Blood pressure (!) 136/46, pulse 98, temperature 98 F (36.7 C), temperature source Oral, resp. rate 16, height 5\' 7"  (1.702 m), weight 44 kg (97 lb 0 oz), SpO2 98 %. No results found. Recent Labs    10/03/17 0521  WBC 10.7*  HGB 10.0*  HCT 31.8*  PLT 260   Recent Labs    10/03/17 0521  NA 136  K 4.4  CL 101  GLUCOSE 110*  BUN 18  CREATININE 0.68  CALCIUM 8.4*   CBG (last 3)  Recent Labs    10/03/17 2337 10/04/17 0417 10/04/17 0819  GLUCAP 95 125* 122*    Wt Readings from Last 3 Encounters:  10/04/17 44 kg (97 lb 0 oz)  09/28/17 46.4 kg (102 lb 4.7 oz)  09/19/17 49.8 kg (109 lb 12.6 oz)    Physical Exam:  BP (!) 136/46 (BP Location: Left Arm)   Pulse 98   Temp 98 F (36.7 C) (Oral)   Resp 16   Ht 5\' 7"  (1.702 m)   Wt 44 kg (97 lb 0 oz)   SpO2 98%   BMI 15.19 kg/m  Constitutional: She appears well-developed. She appears cachectic. No distress.  HENT: Normocephalic and atraumatic.  Eyes: No discharge. Dysconjugate gaze with right eye in lateral field (unchanged).  Cardiovascular: Irregularly irregular without JVD. Respiratory: Rhonchi, upper airway sounds. Unlabored. GI: Bowel sounds are normal. She exhibits no distension. +PEG. Musculoskeletal: She exhibits no edema or tenderness in extremities.  Neurological: She is alert. Right facial droop with right inattention.  Severe dysarthria with limited verbal output, ?expressive aphasia.  Able  to follow simple one step motor commands.  Motor: RUE: 4/5 shoulder abduction, 4-/5 elbow flex/ext, 0/5 wrist extension, hand grip (stable). RLE: 4+/5 proximal to distal  (stable) LUE/LLE: 5/5 proximal to distal  Skin: Skin is warm and dry. She is not diaphoretic.  Psychiatric: Her mood appears anxious. Her speech is slurred. She is slowed  Assessment/Plan: 1. Functional deficits secondary to left MCA infarct which require 3+ hours per day of interdisciplinary therapy in a comprehensive inpatient rehab setting. Physiatrist is providing close team supervision and 24 hour management of active medical problems listed below. Physiatrist and rehab team continue to assess barriers to discharge/monitor patient progress toward functional and medical goals.  Function:  Bathing Bathing position   Position: Wheelchair/chair at sink  Bathing parts Body parts bathed by patient: Right arm, Chest, Abdomen, Right upper leg, Left upper leg, Front perineal area, Right lower leg, Left lower leg Body parts bathed by helper: Back, Buttocks, Left arm  Bathing assist Assist Level: Touching or steadying assistance(Pt > 75%)      Upper Body Dressing/Undressing Upper body dressing   What is the patient wearing?: Pull over shirt/dress     Pull over shirt/dress - Perfomed by patient: Put head through opening Pull over shirt/dress - Perfomed by helper: Thread/unthread right sleeve, Thread/unthread left sleeve, Pull shirt over trunk        Upper body assist Assist Level: Touching or steadying assistance(Pt > 75%)      Lower Body Dressing/Undressing Lower body dressing   What is the patient wearing?: Pants,  Socks, Shoes       Pants- Performed by helper: Thread/unthread right pants leg, Thread/unthread left pants leg, Pull pants up/down   Non-skid slipper socks- Performed by helper: Don/doff right sock, Don/doff left sock   Socks - Performed by helper: Don/doff right sock, Don/doff left sock   Shoes - Performed by helper: Don/doff right shoe, Don/doff left shoe, Fasten right, Fasten left          Lower body assist Assist for lower body dressing: (MaxA)       Toileting Toileting Toileting activity did not occur: No continent bowel/bladder event   Toileting steps completed by helper: Adjust clothing prior to toileting, Performs perineal hygiene, Adjust clothing after toileting(per Berkley Harvey, NT)    Toileting assist Assist level: Two helpers(per Cinda Quest, NT)   Transfers Chair/bed transfer   Chair/bed transfer method: Squat pivot Chair/bed transfer assist level: Touching or steadying assistance (Pt > 75%) Chair/bed transfer assistive device: Armrests     Locomotion Ambulation Ambulation activity did not occur: Safety/medical concerns   Max distance: 65 ft Assist level: Moderate assist (Pt 50 - 74%)   Wheelchair   Type: Manual Max wheelchair distance: 100' Assist Level: Maximal assistance (Pt 25 - 49%)  Cognition Comprehension Comprehension assist level: Understands basic 75 - 89% of the time/ requires cueing 10 - 24% of the time  Expression Expression assist level: Expresses basic 25 - 49% of the time/requires cueing 50 - 75% of the time. Uses single words/gestures.  Social Interaction Social Interaction assist level: Interacts appropriately 50 - 74% of the time - May be physically or verbally inappropriate.  Problem Solving Problem solving assist level: Solves basic 25 - 49% of the time - needs direction more than half the time to initiate, plan or complete simple activities  Memory Memory assist level: Recognizes or recalls 25 - 49% of the time/requires cueing 50 - 75% of the time    Medical Problem List and Plan: 1.  Hemiparesis, aphasia, dysphagia secondary to Left MCA infarct.   Continue CIR 2.  DVT Prophylaxis/Anticoagulation: Pharmaceutical: Other (comment)--Eliquis 3. Pain Management: Tylenol prn  4. Mood: LCSW to follow for evaluation and support.  5. Neuropsych: This patient is not fully capable of making decisions on her own behalf. 6. Skin/Wound Care: Pressure relief measures.Maintain adequate  hydration/nutritional status.  7. Fluids/Electrolytes/Nutrition: Adjust tube feeds to maintain adequate nutritional status. Consulted dietitian for input and adjustment.    BMP within acceptable range on 12/17 8. RUL/RLL aspiration PNA   Appreciate pulmonary consult.  Continue  Levaquin per recs.    Remains afebrile   Scheduled Robitussin-DM to help with cough and secretions    Plan for repeat chest x-ray on Thursday. If no improvement, chest CT of her pulm. 9. ?CKD stage III: Monitor renal status with routine checks. Water flushes via PEG.    Creatinine 0.61 on 12/13    Appears to be controlled with adequate hydration 10 Recent GIB: Back on Eliquis.    Hemoglobin 10.0 on 12/17   Continue to monitor 11. Seizure disorder: stable/controlled on Keppra. 12. A Fib: Monitor HR bid. Continue metoprolol and decrease ASA to 81 mg/day.    Heart rate controlled on 12/18 13. Thrombocytosis: Resolved   Likely reactive.    Will continue to monitor.  14. Dysphagia: NPO (bite trials with ST ongoing) and tolerating tube feeds 15. Protein calorie malnutrition: Chronic with baseline albumin around 2.5 in the past 6 months.    Pre-albumin 16.9 on 12/13.  Continue to monitor 16. COPD (per X ray)/RUL bronchogenic lung CA: completed XRT 06/2017 17. Hypokalemia:    Potassium 4.4 on 12/17, after supplementation   Continue to monitor 18. Leukocytosis  WBCs 10.7 on 12/17  Afebrile  Likely from PNA  Cont to monitor  LOS (Days) 6 A FACE TO FACE EVALUATION WAS PERFORMED  Ankit Lorie Phenix 10/04/2017 8:25 AM

## 2017-10-05 ENCOUNTER — Inpatient Hospital Stay (HOSPITAL_COMMUNITY): Payer: Medicare Other | Admitting: *Deleted

## 2017-10-05 ENCOUNTER — Inpatient Hospital Stay (HOSPITAL_COMMUNITY): Payer: Medicare Other | Admitting: Occupational Therapy

## 2017-10-05 ENCOUNTER — Inpatient Hospital Stay (HOSPITAL_COMMUNITY): Payer: Self-pay | Admitting: Speech Pathology

## 2017-10-05 ENCOUNTER — Inpatient Hospital Stay (HOSPITAL_COMMUNITY): Payer: Self-pay

## 2017-10-05 ENCOUNTER — Other Ambulatory Visit: Payer: Self-pay

## 2017-10-05 LAB — GLUCOSE, CAPILLARY
GLUCOSE-CAPILLARY: 121 mg/dL — AB (ref 65–99)
GLUCOSE-CAPILLARY: 125 mg/dL — AB (ref 65–99)
Glucose-Capillary: 105 mg/dL — ABNORMAL HIGH (ref 65–99)
Glucose-Capillary: 114 mg/dL — ABNORMAL HIGH (ref 65–99)
Glucose-Capillary: 93 mg/dL (ref 65–99)
Glucose-Capillary: 97 mg/dL (ref 65–99)

## 2017-10-05 NOTE — Patient Care Conference (Signed)
Inpatient RehabilitationTeam Conference and Plan of Care Update Date: 10/05/2017   Time: 10:30 AM    Patient Name: Kayla Kent      Medical Record Number: [id number]  Date of Birth: Jun 10, 1932 Sex: Female         Room/Bed: 4M03C/4M03C-01 Payor Info: Payor: MEDICARE / Plan: MEDICARE PART A AND B / Product Type: *No Product type* /    Admitting Diagnosis: L CVA  Admit Date/Time:  09/28/2017  3:20 PM Admission Comments: No comment available   Primary Diagnosis:  Acute ischemic left MCA stroke ([place]) Principal Problem: Acute ischemic left MCA stroke [facility])  Patient Active Problem List   Diagnosis Date Noted  . Atrial fibrillation ([place])   . Acute blood loss anemia   . Dysarthria, post-stroke   . PNA (pneumonia)   . Moderate protein-calorie malnutrition ([place])   . Dysphagia, post-stroke   . Aspiration pneumonia of right lower lobe ([place])   . Stage 3 chronic kidney disease ([place])   . Thrombocytosis ([place])   . PAF (paroxysmal atrial fibrillation) ([place])   . Severe protein-calorie malnutrition ([place])   . Hypokalemia   . Acute respiratory failure with hypoxia ([place]) 09/19/2017  . Dysphagia 09/19/2017  . History of GI bleed   . Acute ischemic left MCA stroke ([place]) 09/16/2017  . Cerebral thrombosis with cerebral infarction 09/13/2017  . Fever   . Aspiration pneumonia due to gastric secretions ([place])   . Leukocytosis   . Acute hypoxemic respiratory failure ([place]) 09/11/2017  . Melena   . Pressure injury of skin 09/09/2017  . Heme positive stool   . Acute upper GI bleed 09/06/2017  . HTN (hypertension) 09/06/2017  . Seizure disorder ([place]) 09/06/2017  . Chronic atrial fibrillation ([place]) 09/06/2017  . Thyroid nodule 09/06/2017  . CKD (chronic kidney disease), stage III ([place]) 09/06/2017  . Dyslipidemia 09/06/2017  . COPD (chronic obstructive pulmonary disease) ([place]) 09/06/2017  . CKD (chronic kidney disease) stage 3, GFR 30-59 ml/min ([facility])   . Malignant neoplasm of right upper lobe of lung  ([place]) 06/21/2017  . Pulmonary emphysema ([place]) 04/26/2017  . Pleural effusion 04/26/2017  . Near syncope   . Gastroesophageal reflux disease   . Chest pain 04/14/2017  . Lung nodule < 6cm on CT 04/12/2017  . Acute renal failure ([place])   . Sepsis ([place]) 04/06/2017  . Atrial fibrillation with RVR ([place]) 04/06/2017  . Urinary frequency 03/03/2017  . Hemorrhoids 03/03/2017  . Insomnia 03/03/2017  . Postoperative anemia due to acute blood loss 11/24/2016  . Hematoma left thigh 11/24/2016  . Closed femur fracture ([place]) 11/20/2016  . Pedal edema 10/10/2016  . Persistent atrial fibrillation ([place]) 12/11/2014  . S/P ablation of atrial fibrillation 12/11/2014  . Orthostatic hypotension 12/11/2014  . Paroxysmal SVT (supraventricular tachycardia) ([place]) 12/11/2014  . Atherosclerotic peripheral vascular disease ([place]) 12/11/2014  . Paroxysmal atrial fibrillation ([place]) 11/05/2014  . Orthostasis 10/03/2014  . Non-compliant behavior 10/01/2014  . TIA (transient ischemic attack) 09/23/2014  . Numbness of left hand   . Acute on chronic diastolic heart failure ([place]) 09/21/2014  . Fracture of femoral neck, right ([place]) 08/28/2014  . Distal radius fracture, right 08/28/2014  . Hip fracture requiring operative repair ([place]) 08/28/2014  . Paroxysmal spells 03/28/2014  . Seizures ([place]) 03/28/2013  . Syncope and collapse 07/27/2012  . Routine health maintenance 12/12/2011  . PERIPHERAL CIRCULATORY DISORDER 04/10/2010  . Chronic diastolic CHF (congestive heart failure) ([place]) 07/17/2009  . Depression 07/04/2009  . Anxiety 01/19/2008  . HLD (  hyperlipidemia) 11/14/2007  . Essential hypertension 11/14/2007  . PAROXYSMAL ATRIAL TACHYCARDIA 11/14/2007  . UTERINE PROLAPSE 11/14/2007    Expected Discharge Date: Expected Discharge Date: 10/17/17  Team Members Present: Physician leading conference: Dr. Delice Lesch Social Worker Present: Ovidio Kin, LCSW Nurse Present: Leonette Nutting, RN PT Present: Michaelene Song, PT OT Present: Clyda Greener, OT SLP Present: Charolett Bumpers, SLP PPS Coordinator present : Daiva Nakayama, RN, CRRN     Current Status/Progress Goal Weekly Team Focus  Medical   Hemiparesis, aphasia, dysphagia secondary to Left MCA infarct  Improve mobility, safety, dysphagia, ABLA, WBCs, PNA  See above   Bowel/Bladder   incontinent of bowel and bladder LBM 10-03-17  manage bowel and bladder with mod assistanc maintain regular bowel pattern `  time tolieting   Swallow/Nutrition/ Hydration   Total A trials of HTL, oral apraxia unable to execpt PO trials antieror spillage   MIn A  oral care, trials of HTl / puree    ADL's   min assist for UB bathing, mod assist for UB dressing, mod assist for LB bathing with max assist for dressing.  Mod assist for stand pivot transfers.  Still with pusher tendencies to the right in standing.  Brunnstrum stage V in the right arm and elbow but stage I in the wrist and digits.  Max hand over hand to integrate into function.  Motor planning deficits still present as well.  min assist overall  selfcare retraining, balance retraining, transfer training, neuromuscular re-education, pt/family education, therapeutic exercise   Mobility   min assist for bed mobility, min-mod assist squat pivot transfers, mod assist gait up to 60 ft without AD, mod assist for standing balance secondary to R posterior lean.   min assist  postural control, balance, gait, transfers, R NMR, family ed   Communication   Max A to use communication board, Min A id and yes/no questions   Mod A  communication board,    Safety/Cognition/ Behavioral Observations  Mod-Max A, naming, problem solving, id deficits   Mod-Min A  Id deficits, sustained attention, basic probelm solving   Pain   no c/o pain  no c/o pain  Assess pain q shift and prn    Skin   heels boggy, stage 2 bottom, reddness to bottom gerhardtt cream in use,   no new skin issue, resolving stage 2   Assess skin q shift and prn  apply order cream to bottom , allevyn in use      *See Care Plan and progress notes for long and short-term goals.     Barriers to Discharge  Current Status/Progress Possible Resolutions Date Resolved   Physician    Decreased caregiver support;Medical stability;Nutrition means;Incontinence     See above  Therapies, follow labs, cont abx, plan for CXR tomorrow      Nursing                  PT                    OT                  SLP                SW                Discharge Planning/Teaching Needs:  Daughter and son very involved, daughter is local and hoeps to take pt home depends upon her level at discharge.  Team Discussion:  Goals min assist level. Trials of honey and puree with Speech. R-posterior lean and pushes to the right which throws off her balance. Motor planning deficits. R-UE improving. Antibiotics to finish tomorrow and will do chest x-ray and if needed consult pulm MD or chest CT. RN working on timed toileting  Revisions to Treatment Plan:  DC 12/31    Continued Need for Acute Rehabilitation Level of Care: The patient requires daily medical management by a physician with specialized training in physical medicine and rehabilitation for the following conditions: Daily direction of a multidisciplinary physical rehabilitation program to ensure safe treatment while eliciting the highest outcome that is of practical value to the patient.: Yes Daily medical management of patient stability for increased activity during participation in an intensive rehabilitation regime.: Yes Daily analysis of laboratory values and/or radiology reports with any subsequent need for medication adjustment of medical intervention for : Pulmonary problems;Neurological problems;Nutritional problems  Elease Hashimoto 10/05/2017, 1:37 PM

## 2017-10-05 NOTE — Progress Notes (Signed)
Recreational Therapy Session Note  Patient Details  Name: Kayla Kent MRN: [id number] Date of Birth: 07/08/1932 Today's Date: 10/05/2017  TR eval deferred, will continue to monitor through team for future participation. [place] 10/05/2017, 12:11 PM

## 2017-10-05 NOTE — Progress Notes (Signed)
Social Work   Jeno Calleros, Eliezer Champagne  Social Worker  Physical Medicine and Rehabilitation  Patient Care Conference  Signed  Date of Service:  10/05/2017  1:36 PM          Signed          [] Hide copied text  [] Hover for details   Inpatient RehabilitationTeam Conference and Plan of Care Update Date: 10/05/2017   Time: 10:30 AM      Patient Name: Kayla Kent      Medical Record Number: [id number]  Date of Birth: 1932/09/17 Sex: Female         Room/Bed: 4M03C/4M03C-01 Payor Info: Payor: MEDICARE / Plan: MEDICARE PART A AND B / Product Type: *No Product type* /     Admitting Diagnosis: L CVA  Admit Date/Time:  09/28/2017  3:20 PM Admission Comments: No comment available    Primary Diagnosis:  Acute ischemic left MCA stroke ([place]) Principal Problem: Acute ischemic left MCA stroke [facility])       Patient Active Problem List    Diagnosis Date Noted  . Atrial fibrillation ([place])    . Acute blood loss anemia    . Dysarthria, post-stroke    . PNA (pneumonia)    . Moderate protein-calorie malnutrition ([place])    . Dysphagia, post-stroke    . Aspiration pneumonia of right lower lobe ([place])    . Stage 3 chronic kidney disease ([place])    . Thrombocytosis ([place])    . PAF (paroxysmal atrial fibrillation) ([place])    . Severe protein-calorie malnutrition ([place])    . Hypokalemia    . Acute respiratory failure with hypoxia ([place]) 09/19/2017  . Dysphagia 09/19/2017  . History of GI bleed    . Acute ischemic left MCA stroke ([place]) 09/16/2017  . Cerebral thrombosis with cerebral infarction 09/13/2017  . Fever    . Aspiration pneumonia due to gastric secretions ([place])    . Leukocytosis    . Acute hypoxemic respiratory failure ([place]) 09/11/2017  . Melena    . Pressure injury of skin 09/09/2017  . Heme positive stool    . Acute upper GI bleed 09/06/2017  . HTN (hypertension) 09/06/2017  . Seizure disorder ([place]) 09/06/2017  . Chronic atrial fibrillation ([place]) 09/06/2017  . Thyroid nodule  09/06/2017  . CKD (chronic kidney disease), stage III ([place]) 09/06/2017  . Dyslipidemia 09/06/2017  . COPD (chronic obstructive pulmonary disease) ([place]) 09/06/2017  . CKD (chronic kidney disease) stage 3, GFR 30-59 ml/min (HCC)    . Malignant neoplasm of right upper lobe of lung ([place]) 06/21/2017  . Pulmonary emphysema ([place]) 04/26/2017  . Pleural effusion 04/26/2017  . Near syncope    . Gastroesophageal reflux disease    . Chest pain 04/14/2017  . Lung nodule < 6cm on CT 04/12/2017  . Acute renal failure ([place])    . Sepsis ([place]) 04/06/2017  . Atrial fibrillation with RVR ([place]) 04/06/2017  . Urinary frequency 03/03/2017  . Hemorrhoids 03/03/2017  . Insomnia 03/03/2017  . Postoperative anemia due to acute blood loss 11/24/2016  . Hematoma left thigh 11/24/2016  . Closed femur fracture ([place]) 11/20/2016  . Pedal edema 10/10/2016  . Persistent atrial fibrillation ([place]) 12/11/2014  . S/P ablation of atrial fibrillation 12/11/2014  . Orthostatic hypotension 12/11/2014  . Paroxysmal SVT (supraventricular tachycardia) ([place]) 12/11/2014  . Atherosclerotic peripheral vascular disease ([place]) 12/11/2014  . Paroxysmal atrial fibrillation ([place]) 11/05/2014  . Orthostasis 10/03/2014  . Non-compliant behavior 10/01/2014  . TIA (transient ischemic attack) 09/23/2014  .  Numbness of left hand    . Acute on chronic diastolic heart failure ([place]) 09/21/2014  . Fracture of femoral neck, right ([place]) 08/28/2014  . Distal radius fracture, right 08/28/2014  . Hip fracture requiring operative repair ([place]) 08/28/2014  . Paroxysmal spells 03/28/2014  . Seizures ([place]) 03/28/2013  . Syncope and collapse 07/27/2012  . Routine health maintenance 12/12/2011  . PERIPHERAL CIRCULATORY DISORDER 04/10/2010  . Chronic diastolic CHF (congestive heart failure) ([place]) 07/17/2009  . Depression 07/04/2009  . Anxiety 01/19/2008  . HLD (hyperlipidemia) 11/14/2007  . Essential hypertension 11/14/2007  . PAROXYSMAL ATRIAL  TACHYCARDIA 11/14/2007  . UTERINE PROLAPSE 11/14/2007      Expected Discharge Date: Expected Discharge Date: 10/17/17   Team Members Present: Physician leading conference: Dr. Delice Lesch Social Worker Present: Ovidio Kin, LCSW Nurse Present: Leonette Nutting, RN PT Present: Michaelene Song, PT OT Present: Clyda Greener, OT SLP Present: Charolett Bumpers, SLP PPS Coordinator present : Daiva Nakayama, RN, CRRN       Current Status/Progress Goal Weekly Team Focus  Medical     Hemiparesis, aphasia, dysphagia secondary to Left MCA infarct  Improve mobility, safety, dysphagia, ABLA, WBCs, PNA  See above   Bowel/Bladder     incontinent of bowel and bladder LBM 10-03-17  manage bowel and bladder with mod assistanc maintain regular bowel pattern `  time tolieting   Swallow/Nutrition/ Hydration     Total A trials of HTL, oral apraxia unable to execpt PO trials antieror spillage   MIn A  oral care, trials of HTl / puree    ADL's     min assist for UB bathing, mod assist for UB dressing, mod assist for LB bathing with max assist for dressing.  Mod assist for stand pivot transfers.  Still with pusher tendencies to the right in standing.  Brunnstrum stage V in the right arm and elbow but stage I in the wrist and digits.  Max hand over hand to integrate into function.  Motor planning deficits still present as well.  min assist overall  selfcare retraining, balance retraining, transfer training, neuromuscular re-education, pt/family education, therapeutic exercise   Mobility     min assist for bed mobility, min-mod assist squat pivot transfers, mod assist gait up to 60 ft without AD, mod assist for standing balance secondary to R posterior lean.   min assist  postural control, balance, gait, transfers, R NMR, family ed   Communication     Max A to use communication board, Min A id and yes/no questions   Mod A  communication board,    Safety/Cognition/ Behavioral Observations   Mod-Max A, naming, problem  solving, id deficits   Mod-Min A  Id deficits, sustained attention, basic probelm solving   Pain     no c/o pain  no c/o pain  Assess pain q shift and prn    Skin     heels boggy, stage 2 bottom, reddness to bottom gerhardtt cream in use,   no new skin issue, resolving stage 2   Assess skin q shift and prn apply order cream to bottom , allevyn in use     *See Care Plan and progress notes for long and short-term goals.      Barriers to Discharge   Current Status/Progress Possible Resolutions Date Resolved   Physician     Decreased caregiver support;Medical stability;Nutrition means;Incontinence     See above  Therapies, follow labs, cont abx, plan for CXR tomorrow  Nursing                 PT                    OT                 SLP            SW              Discharge Planning/Teaching Needs:  Daughter and son very involved, daughter is local and hoeps to take pt home depends upon her level at discharge.      Team Discussion:  Goals min assist level. Trials of honey and puree with Speech. R-posterior lean and pushes to the right which throws off her balance. Motor planning deficits. R-UE improving. Antibiotics to finish tomorrow and will do chest x-ray and if needed consult pulm MD or chest CT. RN working on timed toileting  Revisions to Treatment Plan:  DC 12/31    Continued Need for Acute Rehabilitation Level of Care: The patient requires daily medical management by a physician with specialized training in physical medicine and rehabilitation for the following conditions: Daily direction of a multidisciplinary physical rehabilitation program to ensure safe treatment while eliciting the highest outcome that is of practical value to the patient.: Yes Daily medical management of patient stability for increased activity during participation in an intensive rehabilitation regime.: Yes Daily analysis of laboratory values and/or radiology reports with any subsequent need for  medication adjustment of medical intervention for : Pulmonary problems;Neurological problems;Nutritional problems   Elease Hashimoto 10/05/2017, 1:37 PM                 Patient ID: Kayla Kent, female   DOB: [birth year], 83 y.o.   MRN: [id number]

## 2017-10-05 NOTE — Plan of Care (Signed)
Total assistance

## 2017-10-05 NOTE — Progress Notes (Signed)
Occupational Therapy Weekly Progress Note  Patient Details  Name: Kayla Kent MRN: [id number] Date of Birth: 1932/07/17  Beginning of progress report period: September 29, 2017 End of progress report period: October 05, 2017  Today's Date: 10/05/2017 OT Individual Time: 8250-5397 OT Individual Time Calculation (min): 64 min    Patient has met 4 of 5 short term goals.  Pt continues to make steady progress with OT.  Overall she is able to perform UB bathing and dressing at a min assist level as well as LB bathing at a mod assist level.  Mod demonstrational cueing is needed to sequence through bathing tasks and donning of pullover shirt.  She continues to need max assist for LB dressing secondary to having difficulty reaching her feet for donning shoes and socks.  Mod assist for stand pivot transfers and standing balance with LB selfcare secondary to spatial orientation deficits and pushing to the right side.  She demonstrates isolated movement of the right shoulder and elbow at a Brunnstrum stage V but only demonstrates stage II in the hand and digits.  Max hand over hand assistance needed to integrate the RUE into bathing and grooming tasks at this time.  She continues to demonstrate motor planning deficits as well requiring hand over hand assistance for bilateral tasks or when attempting to wash the LUE with the RUE.  Feel she is making steady progress with OT but recommend continued OT to further progress independence level for discharge home.  Continued CIR is scheduled until expected discharge 12/31.  Patient continues to demonstrate the following deficits: muscle weakness, impaired timing and sequencing, abnormal tone, motor apraxia, decreased coordination and decreased motor planning, decreased visual motor skills, decreased midline orientation, right side neglect and decreased motor planning, decreased problem solving, decreased safety awareness, decreased memory and delayed processing and  decreased sitting balance, decreased standing balance, decreased postural control, hemiplegia and decreased balance strategies and therefore will continue to benefit from skilled OT intervention to enhance overall performance with BADL and Reduce care partner burden.  Patient progressing toward long term goals..  Continue plan of care.  OT Short Term Goals Week 2:  OT Short Term Goal 1 (Week 2): Pt will perform LB dressing with mod assist for gripper socks and pull up pants.   OT Short Term Goal 2 (Week 2): Pt will complete LB bathing sit to stand with min assist 2 consecutive sessions.  OT Short Term Goal 3 (Week 2): Pt will complete toileting with min assist sit to stand.   OT Short Term Goal 4 (Week 2): Pt will donn pullover shirt with supervision and mod instructional cueing.  Skilled Therapeutic Interventions/Progress Updates:    Pt completed bathing and dressing at the sink during session.  Mod demonstrational cueing for sequencing throughout task with max hand over hand for use of the RUE to wash the left arm and for applying deodorant.  Mod assist for bathing sit to stand with increased pushing and lean to the right in standing.  Min assist to donn a pullover shirt with max assist for donning socks and shoes.  Completed grooming tasks of brushing hair and oral hygiene with supervision.  Pt left in wheelchair with call button in reach and safety belt in place.  Chair alarm also used as well.    Therapy Documentation Precautions:  Precautions Precautions: Fall Precaution Comments: R hemiplegia Restrictions Weight Bearing Restrictions: No   Pain: Pain Assessment Pain Assessment: Faces Faces Pain Scale: No hurt Pain Type: Acute  pain ADL: See Function Navigator for Current Functional Status.   Therapy/Group: Individual Therapy  , OTR/L 10/05/2017, 12:15 PM

## 2017-10-05 NOTE — Progress Notes (Signed)
[place] PHYSICAL MEDICINE & REHABILITATION     PROGRESS NOTE  Subjective/Complaints:  Pt seen laying in bed this AM.  She slept well overnight.  She denies complaints.   ROS: Appears to deny CP, SOB, N/V/D.  Objective: Vital Signs: Blood pressure 133/73, pulse 70, temperature 98 F (36.7 C), temperature source Oral, resp. rate 18, height 5\' 7"  (1.702 m), weight 44.2 kg (97 lb 5.5 oz), SpO2 98 %. No results found. Recent Labs    10/03/17 0521  WBC 10.7*  HGB 10.0*  HCT 31.8*  PLT 260   Recent Labs    10/03/17 0521  NA 136  K 4.4  CL 101  GLUCOSE 110*  BUN 18  CREATININE 0.68  CALCIUM 8.4*   CBG (last 3)  Recent Labs    10/04/17 2329 10/05/17 0328 10/05/17 0756  GLUCAP 101* 105* 121*    Wt Readings from Last 3 Encounters:  10/05/17 44.2 kg (97 lb 5.5 oz)  09/28/17 46.4 kg (102 lb 4.7 oz)  09/19/17 49.8 kg (109 lb 12.6 oz)    Physical Exam:  BP 133/73 (BP Location: Left Arm)   Pulse 70   Temp 98 F (36.7 C) (Oral)   Resp 18   Ht 5\' 7"  (1.702 m)   Wt 44.2 kg (97 lb 5.5 oz)   SpO2 98%   BMI 15.25 kg/m  Constitutional: She appears well-developed. She appears cachectic. NAD.  HENT: Normocephalic and atraumatic.  Eyes: No discharge. Dysconjugate gaze with right eye in lateral field (stable).  Cardiovascular: Irregularly irregular without JVD. Respiratory: Rhonchi, upper airway sounds. Unlabored. GI: Bowel sounds are normal. She exhibits no distension. +PEG c/d/i. Musculoskeletal: She exhibits no edema or tenderness in extremities.  Neurological: She is alert. Right facial droop with right inattention.  Severe dysarthria with limited verbal output, ?expressive aphasia.  Able  to follow simple one step motor commands.  Motor: RUE: 4/5 shoulder abduction, 4-/5 elbow flex/ext, 0/5 wrist extension, hand grip (unchanged). RLE: 4+/5 proximal to distal (stable) LUE/LLE: 5/5 proximal to distal  Skin: Skin is warm and dry. She is not diaphoretic.   Psychiatric: Her mood appears anxious. Her speech is slurred. She is slowed  Assessment/Plan: 1. Functional deficits secondary to left MCA infarct which require 3+ hours per day of interdisciplinary therapy in a comprehensive inpatient rehab setting. Physiatrist is providing close team supervision and 24 hour management of active medical problems listed below. Physiatrist and rehab team continue to assess barriers to discharge/monitor patient progress toward functional and medical goals.  Function:  Bathing Bathing position   Position: Wheelchair/chair at sink  Bathing parts Body parts bathed by patient: Left arm, Chest, Abdomen, Right upper leg, Left upper leg, Left lower leg Body parts bathed by helper: Buttocks, Front perineal area, Right arm, Right lower leg, Back  Bathing assist Assist Level: Touching or steadying assistance(Pt > 75%)      Upper Body Dressing/Undressing Upper body dressing   What is the patient wearing?: Pull over shirt/dress     Pull over shirt/dress - Perfomed by patient: Put head through opening, Thread/unthread left sleeve Pull over shirt/dress - Perfomed by helper: Pull shirt over trunk, Thread/unthread right sleeve        Upper body assist Assist Level: Touching or steadying assistance(Pt > 75%)      Lower Body Dressing/Undressing Lower body dressing   What is the patient wearing?: Pants, Socks, Shoes     Pants- Performed by patient: Thread/unthread left pants leg Pants- Performed by helper:  Thread/unthread right pants leg, Pull pants up/down   Non-skid slipper socks- Performed by helper: Don/doff right sock, Don/doff left sock   Socks - Performed by helper: Don/doff right sock, Don/doff left sock   Shoes - Performed by helper: Don/doff right shoe, Don/doff left shoe, Fasten right, Fasten left          Lower body assist Assist for lower body dressing: (MaxA)      Toileting Toileting Toileting activity did not occur: No continent  bowel/bladder event   Toileting steps completed by helper: Adjust clothing prior to toileting, Performs perineal hygiene, Adjust clothing after toileting(per Berkley Harvey, NT)    Toileting assist Assist level: Two helpers(per Cinda Quest, NT)   Transfers Chair/bed transfer   Chair/bed transfer method: Stand pivot Chair/bed transfer assist level: Moderate assist (Pt 50 - 74%/lift or lower) Chair/bed transfer assistive device: Armrests     Locomotion Ambulation Ambulation activity did not occur: Safety/medical concerns   Max distance: 41' Assist level: Moderate assist (Pt 50 - 74%)   Wheelchair   Type: Manual Max wheelchair distance: 150' Assist Level: Supervision or verbal cues  Cognition Comprehension Comprehension assist level: Understands basic 75 - 89% of the time/ requires cueing 10 - 24% of the time  Expression Expression assist level: Expresses basic 50 - 74% of the time/requires cueing 25 - 49% of the time. Needs to repeat parts of sentences.  Social Interaction Social Interaction assist level: Interacts appropriately 75 - 89% of the time - Needs redirection for appropriate language or to initiate interaction.  Problem Solving Problem solving assist level: Solves basic 25 - 49% of the time - needs direction more than half the time to initiate, plan or complete simple activities  Memory Memory assist level: Recognizes or recalls 25 - 49% of the time/requires cueing 50 - 75% of the time    Medical Problem List and Plan: 1.  Hemiparesis, aphasia, dysphagia secondary to Left MCA infarct.   Continue CIR 2.  DVT Prophylaxis/Anticoagulation: Pharmaceutical: Other (comment)--Eliquis 3. Pain Management: Tylenol prn  4. Mood: LCSW to follow for evaluation and support.  5. Neuropsych: This patient is not fully capable of making decisions on her own behalf. 6. Skin/Wound Care: Pressure relief measures.Maintain adequate hydration/nutritional status.  7.  Fluids/Electrolytes/Nutrition: Adjust tube feeds to maintain adequate nutritional status. Consulted dietitian for input and adjustment.    BMP within acceptable range on 12/17 8. RUL/RLL aspiration PNA   Appreciate pulmonary consult.  Continue  Levaquin per recs.    Remains afebrile   Scheduled Robitussin-DM to help with cough and secretions    Plan for repeat chest x-ray on Thursday. If no improvement, chest CT of her pulm. 9. ?CKD stage III: Monitor renal status with routine checks. Water flushes via PEG.    Creatinine 0.68 on 12/17   Appears to be controlled with adequate hydration 10 Recent GIB: Back on Eliquis.    Hemoglobin 10.0 on 12/17   Labs ordered for tomorrow   Continue to monitor 11. Seizure disorder: stable/controlled on Keppra. 12. A Fib: Monitor HR bid. Continue metoprolol and decrease ASA to 81 mg/day.    Heart rate controlled on 12/19 13. Thrombocytosis: Resolved   Likely reactive.    Will continue to monitor.  14. Dysphagia: NPO (holding bite trials with ST), tolerating tube feeds 15. Protein calorie malnutrition: Chronic with baseline albumin around 2.5 in the past 6 months.    Pre-albumin 16.9 on 12/13.    Continue to monitor 16. COPD (per X  ray)/RUL bronchogenic lung CA: completed XRT 06/2017 17. Hypokalemia:    Potassium 4.4 on 12/17, after supplementation   Continue to monitor 18. Leukocytosis  WBCs 10.7 on 12/17  Labs ordered for tomorrow  Afebrile  Likely from PNA  Cont to monitor  LOS (Days) 7 A FACE TO FACE EVALUATION WAS PERFORMED  Kayla Kent Lorie Phenix 10/05/2017 8:13 AM

## 2017-10-05 NOTE — Progress Notes (Signed)
Social Work Patient ID: Kayla Kent, female   DOB: 07/21/1932, 83 y.o.   MRN: [id number] Met with pt and daughter who was here to discuss team conference goals min assist level and target discharge date 12/31. Daughter does not fel she will be ready and this is too short length of stay. Discussed to come in and attend therapies and talk with the therapist since they set the goals and ask her questions. Feels nursing is not taking her to the bathroom and had Andrea-RN address pt is incontinent and not able to tell them she needs to go to the bathroom until after she has already gone. Daughter also wants to know about her eating and if she will be able too. Encouraged her to attend speech therapy in the next few days. She voiced she has a living will and was told the PEG tube is a short term solution. Discussed the options of hiring assist versus going to a NH. She wants her questions answered first before discussing what the plan will be.

## 2017-10-05 NOTE — Progress Notes (Signed)
Physical Therapy Session Note  Patient Details  Name: Kayla Kent MRN: [id number] Date of Birth: [birth year]  Today's Date: 10/05/2017 PT Individual Time: 1400-1510 PT Individual Time Calculation (min): 70 min   Short Term Goals: Week 1:  PT Short Term Goal 1 (Week 1): Pt will maintain static sitting balance w/ supervision PT Short Term Goal 2 (Week 1): Pt will transfer bed<>chair w/ mod assist PT Short Term Goal 3 (Week 1): Pt will self-propel w/c using L hemi technique w/ mod assist PT Short Term Goal 4 (Week 1): Pt will tolerate sitting OOB in between therapies for 2 hours at a time  PT Short Term Goal 5 (Week 1): Pt will maintain neutral trunk alignment in sitting or standing w/ minimal cues in 50% of trials   Skilled Therapeutic Interventions/Progress Updates:    Pt supine in bed upon PT arrival, agreeable to therapy tx and denies pain. Pt transferred supine>sitting EOB with min assist. Pt transferred bed>w/c mod assist, squat pivot transfer. Pt propelled w/c from room<>gym x 150 ft each directions with supervision and verbal cues to avoid obstacles. Pt ambulated x 45 ft without AD, continues to demonstrate increased R lateral lean secondary to pushing with standing activities. Pt worked on L lateral leans and finding midline in standing using a mirror for visual feedback and using a wall to lean into for tactile feedback. Pt able to lean left to touch shoulder and hip to wall and then come back to midline without pushing to the R, min assist. Pt ambulated x 30 ft with the mirror for feedback, unable to maintain midline and requiring mod assist to prevent R LOB. Pt worked on leaning L in standing in order to retrieve object from L side, requiring mod-max assist to maintain balance. Pt ambulated x 65 ft without AD and mod assist, improved midline orientation this bout but continues to push to the R. Pt propelled w/c back to room and transferred w/c>bed min assist squat pivot transfer. Pt  transferred sitting>supine with min assist. Pt left supine in bed with needs in reach and daughter present.   Therapy Documentation Precautions:  Precautions Precautions: Fall Precaution Comments: R hemiplegia Restrictions Weight Bearing Restrictions: No   See Function Navigator for Current Functional Status.   Therapy/Group: Individual Therapy  Netta Corrigan, PT, DPT 10/05/2017, 7:54 AM

## 2017-10-05 NOTE — Progress Notes (Signed)
Speech Language Pathology Daily Session Note  Patient Details  Name: Kayla Kent MRN: [id number] Date of Birth: 06-21-1932  Today's Date: 10/05/2017 SLP Individual Time: 1315-1400 SLP Individual Time Calculation (min): 45 min  Short Term Goals: Week 1: SLP Short Term Goal 1 (Week 1): Pt will name familiar objects with mod assist multimodal cues to recognize and correct verbal errors.   SLP Short Term Goal 2 (Week 1): Pt will utilize a basic, text based augmented communication system via pointing to convey immediate needs and wants in ~50% of opportunities with max assist multimodal cues SLP Short Term Goal 3 (Week 1): Pt will slow rate and increase vocal intensity to achieve intelligibility at the word level for ~50% accuracy with max assist multimodal cues.   SLP Short Term Goal 4 (Week 1): Pt will consume therapeutic trials of honey thick liquids and purees with minimal overt s/s of aspiration and min cues for use of swallowing precautions over 3 consecutive targeted sessions prior to repeat objective study.   SLP Short Term Goal 5 (Week 1): Pt will sustain her attention to basic, familiar tasks for 5 minute intervals with mod verbal cues for redirection.    Skilled Therapeutic Interventions: Skilled ST services focused on cognition and swallow skills. SLP offered trials of HTL or puree, however pt decline accpeting oral care only. Pt demonstrated 4 volitional swallows during session. SLP facilitated word finding skills, pt demonstrated ability to name basic words when presented with opposite with Min A verbal cues, named words in convergent tasks with Min A verbal cues and named an average of 4 words in divergent naming tasks (7 words with max A verbal cues), however required Mod-Max A verbal cues for intelligibility. Pt demonstrated ability to clarify messages when prompted with letter board with 30% accuracy in spelling, however required supervision A verbal cues to recognize errors when  verbally spelled back to pt or in written form and Mod A verbal cues to correct spelling error. Pt demonstrated ability to read letter on letter board in order, however given confrontation naming task of letters and receptively locating letters Mod A verbal cues.Pt gestured for blind to be pulled down. Pt was left in room with call bell within reach. Recommend to continue skilled ST services.   Function:  Eating Eating                 Cognition Comprehension Comprehension assist level: Understands basic 75 - 89% of the time/ requires cueing 10 - 24% of the time  Expression Expression assistive device: Communication board Expression assist level: Expresses basic 50 - 74% of the time/requires cueing 25 - 49% of the time. Needs to repeat parts of sentences.  Social Interaction Social Interaction assist level: Interacts appropriately 75 - 89% of the time - Needs redirection for appropriate language or to initiate interaction.  Problem Solving Problem solving assist level: Solves basic 25 - 49% of the time - needs direction more than half the time to initiate, plan or complete simple activities  Memory Memory assist level: Recognizes or recalls 25 - 49% of the time/requires cueing 50 - 75% of the time    Pain Pain Assessment Pain Assessment: No/denies pain Faces Pain Scale: No hurt Pain Type: Acute pain  Therapy/Group: Individual Therapy  Kayla Kent  [facility] 10/05/2017, 2:50 PM

## 2017-10-06 ENCOUNTER — Inpatient Hospital Stay (HOSPITAL_COMMUNITY): Payer: Self-pay | Admitting: Physical Therapy

## 2017-10-06 ENCOUNTER — Inpatient Hospital Stay (HOSPITAL_COMMUNITY): Payer: Medicare Other

## 2017-10-06 ENCOUNTER — Inpatient Hospital Stay (HOSPITAL_COMMUNITY): Payer: Self-pay

## 2017-10-06 ENCOUNTER — Inpatient Hospital Stay (HOSPITAL_COMMUNITY): Payer: Self-pay | Admitting: Speech Pathology

## 2017-10-06 ENCOUNTER — Inpatient Hospital Stay (HOSPITAL_COMMUNITY): Payer: Self-pay | Admitting: Occupational Therapy

## 2017-10-06 LAB — CBC WITH DIFFERENTIAL/PLATELET
BASOS ABS: 0 10*3/uL (ref 0.0–0.1)
BASOS PCT: 0 %
Eosinophils Absolute: 0.2 10*3/uL (ref 0.0–0.7)
Eosinophils Relative: 2 %
HEMATOCRIT: 30.7 % — AB (ref 36.0–46.0)
HEMOGLOBIN: 9.9 g/dL — AB (ref 12.0–15.0)
LYMPHS PCT: 14 %
Lymphs Abs: 1.2 10*3/uL (ref 0.7–4.0)
MCH: 31.4 pg (ref 26.0–34.0)
MCHC: 32.2 g/dL (ref 30.0–36.0)
MCV: 97.5 fL (ref 78.0–100.0)
Monocytes Absolute: 1 10*3/uL (ref 0.1–1.0)
Monocytes Relative: 11 %
NEUTROS ABS: 6.6 10*3/uL (ref 1.7–7.7)
NEUTROS PCT: 73 %
Platelets: 268 10*3/uL (ref 150–400)
RBC: 3.15 MIL/uL — AB (ref 3.87–5.11)
RDW: 16.1 % — ABNORMAL HIGH (ref 11.5–15.5)
WBC: 9.1 10*3/uL (ref 4.0–10.5)

## 2017-10-06 LAB — GLUCOSE, CAPILLARY
GLUCOSE-CAPILLARY: 88 mg/dL (ref 65–99)
GLUCOSE-CAPILLARY: 77 mg/dL (ref 65–99)
GLUCOSE-CAPILLARY: 112 mg/dL — AB (ref 65–99)
Glucose-Capillary: 102 mg/dL — ABNORMAL HIGH (ref 65–99)

## 2017-10-06 NOTE — Plan of Care (Signed)
  Not Progressing RH BOWEL ELIMINATION RH STG MANAGE BOWEL WITH ASSISTANCE Description STG Manage Bowel with total Assistance.    10/06/2017 1247 - Not Progressing by Marney Setting, RN RH BLADDER ELIMINATION RH STG MANAGE BLADDER WITH ASSISTANCE Description STG Manage Bladder With total Assistance    10/06/2017 1247 - Not Progressing by Marney Setting, RN Note Pt continues to have incontinence related to urgency
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
## 2017-10-06 NOTE — Progress Notes (Signed)
[facility] PHYSICAL MEDICINE & REHABILITATION     PROGRESS NOTE  Subjective/Complaints:  Patient seen sitting up in bed this morning. She states she slept well overnight. She indicates she has a cough that is having trouble bringing it up. Encourage use of suction.  ROS: Appears to deny CP, SOB, N/V/D.  Objective: Vital Signs: Blood pressure (!) 124/43, pulse 61, temperature (!) 97.5 F (36.4 C), temperature source Axillary, resp. rate 16, height 5\' 7"  (1.702 m), weight 43.7 kg (96 lb 5.5 oz), SpO2 96 %. No results found. Recent Labs    10/06/17 0658  WBC 9.1  HGB 9.9*  HCT 30.7*  PLT 268   No results for input(s): NA, K, CL, GLUCOSE, BUN, CREATININE, CALCIUM in the last 72 hours.  Invalid input(s): CO CBG (last 3)  Recent Labs    10/05/17 2025 10/05/17 2355 10/06/17 0410  GLUCAP 97 114* 102*    Wt Readings from Last 3 Encounters:  10/06/17 43.7 kg (96 lb 5.5 oz)  09/28/17 46.4 kg (102 lb 4.7 oz)  09/19/17 49.8 kg (109 lb 12.6 oz)    Physical Exam:  BP (!) 124/43 (BP Location: Left Arm)   Pulse 61   Temp (!) 97.5 F (36.4 C) (Axillary) Comment (Src): Snoring   Resp 16   Ht 5\' 7"  (1.702 m)   Wt 43.7 kg (96 lb 5.5 oz)   SpO2 96%   BMI 15.09 kg/m  Constitutional: She appears well-developed. She appears cachectic. NAD.  HENT: Normocephalic and atraumatic.  Eyes: No discharge. Dysconjugate gaze with right eye in lateral field (stable).  Cardiovascular: Irregularly irregular without JVD. Respiratory: Rhonchi, upper airway sounds. Unlabored. GI: Bowel sounds are normal. She exhibits no distension. +PEG c/d/i. Musculoskeletal: She exhibits no edema or tenderness in extremities.  Neurological: She is alert. Right facial droop with right inattention.  Severe dysarthria with limited verbal output, ?expressive aphasia.  Able  to follow simple one step motor commands.  Motor: RUE: 4/5 shoulder abduction, 4-/5 elbow flex/ext, 0/5 wrist extension, hand grip  (stable). RLE: 4+/5 proximal to distal (stable) LUE/LLE: 5/5 proximal to distal  Skin: Skin is warm and dry. She is not diaphoretic.  Psychiatric: Her mood appears anxious. Her speech is slurred. She is slowed  Assessment/Plan: 1. Functional deficits secondary to left MCA infarct which require 3+ hours per day of interdisciplinary therapy in a comprehensive inpatient rehab setting. Physiatrist is providing close team supervision and 24 hour management of active medical problems listed below. Physiatrist and rehab team continue to assess barriers to discharge/monitor patient progress toward functional and medical goals.  Function:  Bathing Bathing position   Position: Wheelchair/chair at sink  Bathing parts Body parts bathed by patient: Left arm, Chest, Abdomen, Right upper leg, Left upper leg, Left lower leg, Buttocks, Front perineal area Body parts bathed by helper: Right arm, Right lower leg, Back  Bathing assist Assist Level: Touching or steadying assistance(Pt > 75%)      Upper Body Dressing/Undressing Upper body dressing   What is the patient wearing?: Pull over shirt/dress     Pull over shirt/dress - Perfomed by patient: Put head through opening, Thread/unthread left sleeve, Pull shirt over trunk Pull over shirt/dress - Perfomed by helper: Thread/unthread right sleeve        Upper body assist Assist Level: Touching or steadying assistance(Pt > 75%)      Lower Body Dressing/Undressing Lower body dressing   What is the patient wearing?: Pants, Socks, Shoes     Pants-  Performed by patient: Thread/unthread right pants leg Pants- Performed by helper: Thread/unthread left pants leg, Pull pants up/down   Non-skid slipper socks- Performed by helper: Don/doff right sock, Don/doff left sock   Socks - Performed by helper: Don/doff right sock, Don/doff left sock Shoes - Performed by patient: Don/doff left shoe Shoes - Performed by helper: Don/doff right shoe, Fasten right,  Fasten left          Lower body assist Assist for lower body dressing: (MaxA)      Toileting Toileting Toileting activity did not occur: No continent bowel/bladder event   Toileting steps completed by helper: Adjust clothing prior to toileting, Performs perineal hygiene, Adjust clothing after toileting(per Berkley Harvey, NT)    Toileting assist Assist level: Two helpers(per Cinda Quest, NT)   Transfers Chair/bed transfer   Chair/bed transfer method: Stand pivot Chair/bed transfer assist level: Moderate assist (Pt 50 - 74%/lift or lower) Chair/bed transfer assistive device: Armrests     Locomotion Ambulation Ambulation activity did not occur: Safety/medical concerns   Max distance: 60 ft Assist level: Moderate assist (Pt 50 - 74%)   Wheelchair   Type: Manual Max wheelchair distance: 150' Assist Level: Supervision or verbal cues  Cognition Comprehension Comprehension assist level: Understands basic 75 - 89% of the time/ requires cueing 10 - 24% of the time  Expression Expression assist level: Expresses basic 50 - 74% of the time/requires cueing 25 - 49% of the time. Needs to repeat parts of sentences.  Social Interaction Social Interaction assist level: Interacts appropriately 75 - 89% of the time - Needs redirection for appropriate language or to initiate interaction.  Problem Solving Problem solving assist level: Solves basic 25 - 49% of the time - needs direction more than half the time to initiate, plan or complete simple activities  Memory Memory assist level: Recognizes or recalls 25 - 49% of the time/requires cueing 50 - 75% of the time    Medical Problem List and Plan: 1.  Hemiparesis, aphasia, dysphagia secondary to Left MCA infarct.   Continue CIR 2.  DVT Prophylaxis/Anticoagulation: Pharmaceutical: Other (comment)--Eliquis 3. Pain Management: Tylenol prn  4. Mood: LCSW to follow for evaluation and support.  5. Neuropsych: This patient is not fully capable of  making decisions on her own behalf. 6. Skin/Wound Care: Pressure relief measures.Maintain adequate hydration/nutritional status.  7. Fluids/Electrolytes/Nutrition: Adjust tube feeds to maintain adequate nutritional status. Consulted dietitian for input and adjustment.    BMP within acceptable range on 12/17 8. RUL/RLL aspiration PNA   Appreciate pulmonary consult.  Continue  Levaquin per recs.    Remains afebrile   Scheduled Robitussin-DM to help with cough and secretions    Repeat chest x-ray ordered for today, if no improvement, chest CT per pulm. 9. ?CKD stage III: Monitor renal status with routine checks. Water flushes via PEG.    Creatinine 0.68 on 12/17   Appears to be controlled with adequate hydration 10 Recent GIB: Back on Eliquis.    Hemoglobin 9.9 on 12/20   Continue to monitor 11. Seizure disorder: stable/controlled on Keppra. 12. A Fib: Monitor HR bid. Continue metoprolol and decrease ASA to 81 mg/day.    Heart rate controlled on 12/20 13. Thrombocytosis: Resolved   Likely reactive.    Will continue to monitor.  14. Dysphagia: NPO (holding bite trials with ST), tolerating tube feeds 15. Protein calorie malnutrition: Chronic with baseline albumin around 2.5 in the past 6 months.    Pre-albumin 16.9 on 12/13.  Continue to monitor 16. COPD (per X ray)/RUL bronchogenic lung CA: completed XRT 06/2017 17. Hypokalemia:    Potassium 4.4 on 12/17, after supplementation   Continue to monitor 18. Leukocytosis: Resolved  WBCs 9.1 on 12/20  Afebrile  Likely from PNA  Cont to monitor  LOS (Days) 8 A FACE TO FACE EVALUATION WAS PERFORMED  Danyell Shader Lorie Phenix 10/06/2017 8:32 AM

## 2017-10-06 NOTE — Progress Notes (Signed)
Occupational Therapy Session Note  Patient Details  Name: Kayla Kent MRN: [id number] Date of Birth: [birth year]  Today's Date: 10/06/2017 OT Individual Time: 0800-0910 OT Individual Time Calculation (min): 70 min    Short Term Goals: Week 2:  OT Short Term Goal 1 (Week 2): Pt will perform LB dressing with mod assist for gripper socks and pull up pants.   OT Short Term Goal 2 (Week 2): Pt will complete LB bathing sit to stand with min assist 2 consecutive sessions.  OT Short Term Goal 3 (Week 2): Pt will complete toileting with min assist sit to stand.   OT Short Term Goal 4 (Week 2): Pt will donn pullover shirt with supervision and mod instructional cueing.  Skilled Therapeutic Interventions/Progress Updates:    Pt completed shower and dressing during session.  Mod assist for ambulation to the shower bench with hand held assist secondary to pushing to the right in standing, as well as short step length.  She was able to complete bathing sit to stand with overall mod assist.  Max hand over hand assist needed for washing the LUE with the RUE.  She then transferred to the wheelchair for dressing.  Mod demonstrational cueing for hemidressing techniques.  Still with increased difficulty with reaching the LLE for donning socks and for donning shoes with elastic laces.  Finished grooming tasks at the sink with setup for combing hair and oral hygiene.  Pt's daughter in at end of session.  Discussed progress, anticipated discharge date at min assist level, and follow-up therapy.  Will continue to follow and re-assess as needed.  Pt left with daughter at the sink as she was planning on cutting her hair.      Therapy Documentation Precautions:  Precautions Precautions: Fall Precaution Comments: R hemiplegia, expressive aphasis,  Restrictions Weight Bearing Restrictions: No  Pain: Pain Assessment Pain Assessment: No/denies pain ADL: See Function Navigator for Current Functional  Status.   Therapy/Group: Individual Therapy  Garlan Drewes OTR/L 10/06/2017, 12:11 PM

## 2017-10-06 NOTE — Progress Notes (Signed)
Physical Therapy Session Note  Patient Details  Name: Kayla Kent MRN: [id number] Date of Birth: June 13, 1932  Today's Date: 10/06/2017 PT Individual Time: 1400-1515 PT Individual Time Calculation (min): 75 min   Short Term Goals: Week 1:  PT Short Term Goal 1 (Week 1): Pt will maintain static sitting balance w/ supervision PT Short Term Goal 2 (Week 1): Pt will transfer bed<>chair w/ mod assist PT Short Term Goal 3 (Week 1): Pt will self-propel w/c using L hemi technique w/ mod assist PT Short Term Goal 4 (Week 1): Pt will tolerate sitting OOB in between therapies for 2 hours at a time  PT Short Term Goal 5 (Week 1): Pt will maintain neutral trunk alignment in sitting or standing w/ minimal cues in 50% of trials   Skilled Therapeutic Interventions/Progress Updates:    Patient in w/c in room and agreeable to PT.  In w/c propelled to gym with min A and cues for R awareness.  Sit to stand with mirror for feedback with hemiwalker on L and performed reaching task to L to obtain cones and hand to PT on R.  Min to mod A during task with cues for attention to mirror and fixing balance.  Gait with hemiwalker x 5' fwd and back and x 25' with min to mod A pt dragging hemiwalker and difficulty turning to sit.  Patient ambulated with RW with R hand splint x 40' and 70' x 2 with min to mod A and max cues for balance and safety when performing stand to sit.  Patient negotiated obstacles with RW and mod A for managing walker, noticing obstacles on R side and for safety.  Patient seated on mat for sitting balance and visual attention to complete two patterns with pegs with min cues.  Patient declined practicing stairs due to fatigue.  In w/c back to room and assisted to bed via stand pivot with RW with mod A and sit to supine mod A.  Pt left with needs in reach and bed alarm on.   Therapy Documentation Precautions:  Precautions Precautions: Fall Precaution Comments: R hemiplegia, expressive aphasis,   Restrictions Weight Bearing Restrictions: No Pain: Pain Assessment Pain Assessment: No/denies pain   See Function Navigator for Current Functional Status.   Therapy/Group: Individual Therapy  Reginia Naas 10/06/2017, 3:08 PM

## 2017-10-06 NOTE — Progress Notes (Signed)
Speech Language Pathology Weekly Progress and Session Note  Patient Details  Name: Kayla Kent MRN: [id number] Date of Birth: January 25, 1932  Beginning of progress report period:  September 29, 2017  End of progress report period:  October 06, 2017   Today's Date: 10/06/2017 SLP Individual Time: 1005-1100 SLP Individual Time Calculation (min): 55 min  Short Term Goals: Week 1: SLP Short Term Goal 1 (Week 1): Pt will name familiar objects with mod assist multimodal cues to recognize and correct verbal errors.   SLP Short Term Goal 1 - Progress (Week 1): Met SLP Short Term Goal 2 (Week 1): Pt will utilize a basic, text based augmented communication system via pointing to convey immediate needs and wants in ~50% of opportunities with max assist multimodal cues SLP Short Term Goal 2 - Progress (Week 1): Other (comment)(not addressed this reporting period to allow focus on verbal expression ) SLP Short Term Goal 3 (Week 1): Pt will slow rate and increase vocal intensity to achieve intelligibility at the word level for ~50% accuracy with max assist multimodal cues.   SLP Short Term Goal 3 - Progress (Week 1): Met SLP Short Term Goal 4 (Week 1): Pt will consume therapeutic trials of honey thick liquids and purees with minimal overt s/s of aspiration and min cues for use of swallowing precautions over 3 consecutive targeted sessions prior to repeat objective study.   SLP Short Term Goal 4 - Progress (Week 1): Progressing toward goal SLP Short Term Goal 5 (Week 1): Pt will sustain her attention to basic, familiar tasks for 5 minute intervals with mod verbal cues for redirection.   SLP Short Term Goal 5 - Progress (Week 1): Met    New Short Term Goals: Week 2: SLP Short Term Goal 1 (Week 2): Pt will name familiar objects with min assist multimodal cues to recognize and correct verbal errors.   SLP Short Term Goal 2 (Week 2): Pt will utilize a basic, text based augmented communication system via  pointing to convey immediate needs and wants in ~50% of opportunities with max assist multimodal cues SLP Short Term Goal 3 (Week 2): Pt will slow rate and increase vocal intensity to achieve intelligibility at the word level for ~75% accuracy with mod assist multimodal cues.   SLP Short Term Goal 4 (Week 2): Pt will consume therapeutic trials of honey thick liquids and purees with minimal overt s/s of aspiration and min cues for use of swallowing precautions over 3 consecutive targeted sessions prior to repeat objective study.   SLP Short Term Goal 5 (Week 2): Pt will sustain her attention to basic, familiar tasks for 5 minute intervals with min verbal cues for redirection.    Weekly Progress Updates:  Pt has made slow, functional gains this reporting period and has met 3 out of 4 targeted short term goals.  Pt is currently overall mod-max assist for functional communication due to a combination of dysarthria, apraxia, and aphasia.  She has demonstrated improved intelligibility and awareness of verbal errors during structured tasks but needs up to max assist during less structured conversational exchanges.  Pt's swallowing continues to be limited by weakness and coordination deficits which results in poor management of her secretions, decreased bolus control, and immediate overt s/s of aspiration with even limited trials.  Pt and family education is ongoing.  As a result pt would continue to benefit from skilled ST while inpatient in order to maximize functional independence and reduce burden of care prior to  discharge.  Anticipate that pt will need 24/7 supervision at discharge in addition to [place] follow up at next level of care.     Intensity: Minumum of 1-2 x/day, 30 to 90 minutes Frequency: 3 to 5 out of 7 days Duration/Length of Stay: 21-28 days  Treatment/Interventions: Cognitive remediation/compensation;Dysphagia/aspiration precaution training;Cueing hierarchy;Environmental  controls;Internal/external aids;Speech/Language facilitation;Patient/family education;Multimodal communication approach;Functional tasks   Daily Session  Skilled Therapeutic Interventions:  Pt was seen for skilled ST targeting goals for family education.  Pt's daughter was present during today's therapy session and had many questions and concerns regarding pt's care.  She reiterated that pt had a living will in which pt indicated that she did not want to have a PEG and daughter wanted to ensure that therapist was appropriately addressing pt's swallowing.  SLP discussed current goals and progress in therapies as well as realistic prognostic expectations for recovery given pt's current deficits.  SLP reviewed that pt is still having trouble managing her secretions,has difficulty transiting boluses due to motor planning deficits, and continues to show immediate s/s of aspiration with even limited trials of POs.  Pt's daughter was made aware that recovery for both speech and swallowing will be a prolonged process.  SLP also reviewed and reinforced team's recommendations for 24/7 supervision at discharge in addition to [place] follow up at next level of care.  All questions were answered to pt's daughter's satisfaction and daughter was very appreciative of time spent discussing her concerns.  SLP modeled cuing techniques to maximize pt's independence for functional communication during a structured naming task.  Pt intelligibly named objects in pictures for ~75% accuracy with mod assist verbal cues to recognize and correct verbal errors.  SLP also discussed ways to elicit language to maximize treatment effects in between therapy sessions.  Pt was returned to room and left in wheelchair with call bell within reach and daughter at bedside.  Goals updated on this date to reflect current progress and plan of care   Function:   Eating Eating                 Cognition Comprehension Comprehension assist level:  Understands basic 75 - 89% of the time/ requires cueing 10 - 24% of the time  Expression   Expression assist level: Expresses basic 50 - 74% of the time/requires cueing 25 - 49% of the time. Needs to repeat parts of sentences.  Social Interaction Social Interaction assist level: Interacts appropriately 50 - 74% of the time - May be physically or verbally inappropriate.  Problem Solving Problem solving assist level: Solves basic 25 - 49% of the time - needs direction more than half the time to initiate, plan or complete simple activities  Memory Memory assist level: Recognizes or recalls 25 - 49% of the time/requires cueing 50 - 75% of the time   General    Pain Pain Assessment Pain Assessment: No/denies pain  Therapy/Group: Individual Therapy  Geetika Laborde, Selinda Orion 10/06/2017, 1:59 PM

## 2017-10-07 ENCOUNTER — Inpatient Hospital Stay (HOSPITAL_COMMUNITY): Payer: Medicare Other | Admitting: Speech Pathology

## 2017-10-07 ENCOUNTER — Inpatient Hospital Stay (HOSPITAL_COMMUNITY): Payer: Self-pay | Admitting: Occupational Therapy

## 2017-10-07 ENCOUNTER — Inpatient Hospital Stay (HOSPITAL_COMMUNITY): Payer: Self-pay | Admitting: Physical Therapy

## 2017-10-07 DIAGNOSIS — R739 Hyperglycemia, unspecified: Secondary | ICD-10-CM

## 2017-10-07 DIAGNOSIS — R9389 Abnormal findings on diagnostic imaging of other specified body structures: Secondary | ICD-10-CM

## 2017-10-07 LAB — GLUCOSE, CAPILLARY
Glucose-Capillary: 106 mg/dL — ABNORMAL HIGH (ref 65–99)
Glucose-Capillary: 108 mg/dL — ABNORMAL HIGH (ref 65–99)
Glucose-Capillary: 126 mg/dL — ABNORMAL HIGH (ref 65–99)
Glucose-Capillary: 84 mg/dL (ref 65–99)
Glucose-Capillary: 99 mg/dL (ref 65–99)

## 2017-10-07 LAB — PROCALCITONIN

## 2017-10-07 NOTE — Progress Notes (Signed)
Occupational Therapy Session Note  Patient Details  Name: Kayla Kent MRN: [id number] Date of Birth: 04-02-1932  Today's Date: 10/07/2017 OT Individual Time: 0800-0900 OT Individual Time Calculation (min): 60 min    Short Term Goals: Week 2:  OT Short Term Goal 1 (Week 2): Pt will perform LB dressing with mod assist for gripper socks and pull up pants.   OT Short Term Goal 2 (Week 2): Pt will complete LB bathing sit to stand with min assist 2 consecutive sessions.  OT Short Term Goal 3 (Week 2): Pt will complete toileting with min assist sit to stand.   OT Short Term Goal 4 (Week 2): Pt will donn pullover shirt with supervision and mod instructional cueing.  Skilled Therapeutic Interventions/Progress Updates:     Pt transferred from the bed to wheelchair with mod assist to start session.  Worked on bathing and dressing sit to stand at the sink.  Min assist for UB bathing with max demonstrational cueing and max assist for integration of the RUE into functional task for stabilizing washcloth and washing the left hand.  She was able to complete sit to stand during LB selfcare with min assist but needed mod assist to sustained standing balance secondary to pushing to the left in standing.   Mod assist for sequencing and completing LB dressing secondary to decreased ability to reach her LLE efficiently.  She was able to [employer] with min assist with therapist assisting with threading the right first and then pt completing the LUE and pulling over her head and then down her back.  She completed brushing her hair with setup and oral hygiene with mod assist secondary to motor planning deficits before being transition to the side of bed in the wheelchair.  She was left with safety belt in place as well as soft touch call bell in reach.       Therapy Documentation Precautions:  Precautions Precautions: Fall Precaution Comments: R hemiplegia, expressive aphasis,  Restrictions Weight  Bearing Restrictions: No  Pain: Pain Assessment Pain Assessment: No/denies pain ADL: See Function Navigator for Current Functional Status.   Therapy/Group: Individual Therapy  Locklyn Henriquez OTR/L 10/07/2017, 9:01 AM

## 2017-10-07 NOTE — Progress Notes (Signed)
Physical Therapy Session Note  Patient Details  Name: Kayla Kent MRN: [id number] Date of Birth: Oct 08, 1932  Today's Date: 10/07/2017 PT Individual Time: 1100-1200 PT Individual Time Calculation (min): 60 min   Short Term Goals: Week 1:  PT Short Term Goal 1 (Week 1): Pt will maintain static sitting balance w/ supervision PT Short Term Goal 2 (Week 1): Pt will transfer bed<>chair w/ mod assist PT Short Term Goal 3 (Week 1): Pt will self-propel w/c using L hemi technique w/ mod assist PT Short Term Goal 4 (Week 1): Pt will tolerate sitting OOB in between therapies for 2 hours at a time  PT Short Term Goal 5 (Week 1): Pt will maintain neutral trunk alignment in sitting or standing w/ minimal cues in 50% of trials   Skilled Therapeutic Interventions/Progress Updates:    Pt supine in bed upon therapist arrival, pt indicates that she has had incontinence of urine in her brief. SBA with v/c and use of bedrails for rolling R/L to change brief and perform pericare. Supine to sit Mod Assist with HOB elevated. Stand pivot transfer bed to w/c to L with Mod Assist. Sit to stand 3 x 5 reps from mat table to RW with Mod Assist, hand splint for RUE, use of mirror and manual cues for facilitation of RLE and for upright balance. Focus on weight shift forward and LUE placement during transfer.Sit to stand x 5 reps with no AD and Mod Assist, pt exhibits increased lean to R without use of AD for support. Manual w/c propulsion x 150 ft with v/c for technique and Min Assist for steering and avoiding obstacles. Pt assisted back to bed at end of therapy session, reports another incidence of incontinence of urine. SBA for rolling R/L with use of bedrail, dependent for brief change and pericare. Pt left supine in bed with call button in reach. Pt shakes her head "no" when asked if she is experiencing any pain throughout therapy session.  Therapy Documentation Precautions:  Precautions Precautions: Fall Precaution  Comments: R hemiplegia, expressive aphasis,  Restrictions Weight Bearing Restrictions: No  See Function Navigator for Current Functional Status.   Therapy/Group: Individual Therapy  Excell Seltzer, PT, DPT  10/07/2017, 3:38 PM

## 2017-10-07 NOTE — Progress Notes (Signed)
Speech Language Pathology Daily Session Note  Patient Details  Name: Kayla Kent MRN: [id number] Date of Birth: [birth year]  Today's Date: 10/07/2017 SLP Individual Time: 1400-1500 SLP Individual Time Calculation (min): 60 min  Short Term Goals: Week 2: SLP Short Term Goal 1 (Week 2): Pt will name familiar objects with min assist multimodal cues to recognize and correct verbal errors.   SLP Short Term Goal 2 (Week 2): Pt will utilize a basic, text based augmented communication system via pointing to convey immediate needs and wants in ~50% of opportunities with max assist multimodal cues SLP Short Term Goal 3 (Week 2): Pt will slow rate and increase vocal intensity to achieve intelligibility at the word level for ~75% accuracy with mod assist multimodal cues.   SLP Short Term Goal 4 (Week 2): Pt will consume therapeutic trials of honey thick liquids and purees with minimal overt s/s of aspiration and min cues for use of swallowing precautions over 3 consecutive targeted sessions prior to repeat objective study.   SLP Short Term Goal 5 (Week 2): Pt will sustain her attention to basic, familiar tasks for 5 minute intervals with min verbal cues for redirection.    Skilled Therapeutic Interventions: Skilled treatment session focused on language and swallowing goals. Upon arrival, patient was asleep in bed and required Max verbal and tactile cues for arousal. Once awake, patient verbalized "pee" but was already incontinent of urine. SLP and NT provided total A for clothing management and patient was transferred to wheelchair to maximize arousal and alertness. SLP facilitated session by providing Min A verbal cues for patient to name pictures of functional items with 100% accuracy and Mod A verbal and articulatory cues to maximize intelligibility at the word and phrase level to ~75% when comparing and contrasting 2 items. Throughout session, patient required Max A verbal cues to self-monitor and correct  anterior loss of saliva and was able to perform 5 cued volitional swallows of saliva with extra time. Patient transferred back to bed at end of session with alarm on and all needs within reach. Continue with current plan of care.      Function:  Cognition Comprehension Comprehension assist level: Understands basic 75 - 89% of the time/ requires cueing 10 - 24% of the time  Expression   Expression assist level: Expresses basic 50 - 74% of the time/requires cueing 25 - 49% of the time. Needs to repeat parts of sentences.  Social Interaction Social Interaction assist level: Interacts appropriately 75 - 89% of the time - Needs redirection for appropriate language or to initiate interaction.  Problem Solving Problem solving assist level: Solves basic 25 - 49% of the time - needs direction more than half the time to initiate, plan or complete simple activities  Memory Memory assist level: Recognizes or recalls 25 - 49% of the time/requires cueing 50 - 75% of the time    Pain No/Denies Pain   Therapy/Group: Individual Therapy  Delta Deshmukh 10/07/2017, 3:09 PM

## 2017-10-07 NOTE — Progress Notes (Signed)
Occupational Therapy Session Note  Patient Details  Name: Kayla Kent MRN: [id number] Date of Birth: 1932/10/02  Today's Date: 10/07/2017 OT Individual Time: 0931-1001 OT Individual Time Calculation (min): 30 min    Short Term Goals: Week 2:  OT Short Term Goal 1 (Week 2): Pt will perform LB dressing with mod assist for gripper socks and pull up pants.   OT Short Term Goal 2 (Week 2): Pt will complete LB bathing sit to stand with min assist 2 consecutive sessions.  OT Short Term Goal 3 (Week 2): Pt will complete toileting with min assist sit to stand.   OT Short Term Goal 4 (Week 2): Pt will donn pullover shirt with supervision and mod instructional cueing.  Skilled Therapeutic Interventions/Progress Updates:    Treatment session focused on NMR for R UE/LE with there activity, balance training, pt education, and positioning techniques. Upon entering pt upright in w/c with ADLs completed. Pt was agreeable to there activities in room for standing balance activity. Pt required v/c and t/c for hand foot placement for sit<>stand transfer from w/c to walker. Pt completed tx with mod A  With support to R UE. Pt tolerated standing with B weight shift for NMR with use of walker and mod A for support for 1-2 mins x 2. Pt completed SPT with walker from w/c  To bed with mod-max A d/t pushers sydrome. Pt required v/c and t/c for UE/LE placement during transfer for safe descent. Pt completed R UE NMR for shoulder pro/retraction with support to increase functional use of R UE. Pt returned to w/c from EOB sit with walker and mod-max A with quick release belt applied and call bell within reach.   Therapy Documentation Precautions:  Precautions Precautions: Fall Precaution Comments: R hemiplegia, expressive aphasis,  Restrictions Weight Bearing Restrictions: No General:   Vital Signs: Therapy Vitals Pulse Rate: 79 Resp: 18 Patient Position (if appropriate): Sitting Oxygen Therapy SpO2: 96 % O2  Device: Not Delivered Pain: Pain Assessment Pain Assessment: No/denies pain  See Function Navigator for Current Functional Status.   Therapy/Group: Individual Therapy  Delon [place] 10/07/2017, 10:14 AM

## 2017-10-07 NOTE — Progress Notes (Addendum)
Social Work Patient ID: Kayla Kent, female   DOB: 28-Aug-1932, 83 y.o.   MRN: [id number]  Met with daughter to discuss family conference scheduled for Friday 12/28 @ 10:00. She reports that is fine and she will let brother know. She has many concerns and will address at family conference. Unsure at this time what the plan is home versus NH. She feels the RN tell them she is much assist and using a stedy and the therapist say she is min and don't use a lift. She is also concerned that length of stay is short and she is benefiting from therapies here. She also states: " All this happened due to [facility] you all should be bending over backward for [place]." They have been in touch with office of Patient Experience to discuss their concerns. Daughter seems to be grieving the Mom she once had and adjusting to her Mom now with the deficits she has from her stroke. Will continue to work on discharge plans and provide support to daughter.

## 2017-10-07 NOTE — Progress Notes (Signed)
[place] PHYSICAL MEDICINE & REHABILITATION     PROGRESS NOTE  Subjective/Complaints:  Patient seen lying in bed this morning. She states she slept well overnight, but is still sleepy this morning. Informed by case manager as well as Retail buyer regarding family requests to speak with physician. Attempted to call patient's son on 2 occasions and left voice mails, but no return phone call.  ROS: Appears to deny CP, SOB, N/V/D.  Objective: Vital Signs: Blood pressure (!) 130/41, pulse 76, temperature 98.2 F (36.8 C), temperature source Oral, resp. rate 16, height 5\' 7"  (1.702 m), weight 44.6 kg (98 lb 5.2 oz), SpO2 96 %. Dg Chest 2 View  Result Date: 10/06/2017 CLINICAL DATA:  Fever, cough, congestion EXAM: CHEST  2 VIEW COMPARISON:  09/30/2017 FINDINGS: There is persistent right upper lobe suprahilar airspace opacity. The lungs are hyperinflated likely secondary to COPD. There is a trace right pleural effusion. There is no left pleural effusion. There is no pneumothorax. There is stable cardiomegaly. There is thoracic aortic atherosclerosis. There is no acute osseous abnormality. There is generalized osteopenia. IMPRESSION: Persistent right upper lobe pneumonia. Right upper lobe pulmonary nodule is obscured by the area of airspace disease. Interval improved aeration of the right lower lobe. Followup PA and lateral chest X-ray is recommended in 3-4 weeks following trial of antibiotic therapy to ensure resolution and exclude underlying malignancy. Electronically Signed   By: Kathreen Devoid   On: 10/06/2017 10:00   Recent Labs    10/06/17 0658  WBC 9.1  HGB 9.9*  HCT 30.7*  PLT 268   No results for input(s): NA, K, CL, GLUCOSE, BUN, CREATININE, CALCIUM in the last 72 hours.  Invalid input(s): CO CBG (last 3)  Recent Labs    10/06/17 2007 10/07/17 0027 10/07/17 0412  GLUCAP 112* 108* 126*    Wt Readings from Last 3 Encounters:  10/07/17 44.6 kg (98 lb 5.2 oz)  09/28/17 46.4 kg  (102 lb 4.7 oz)  09/19/17 49.8 kg (109 lb 12.6 oz)    Physical Exam:  BP (!) 130/41 (BP Location: Left Arm)   Pulse 76   Temp 98.2 F (36.8 C) (Oral)   Resp 16   Ht 5\' 7"  (1.702 m)   Wt 44.6 kg (98 lb 5.2 oz)   SpO2 96%   BMI 15.40 kg/m  Constitutional: She appears well-developed. She appears cachectic. NAD.  HENT: Normocephalic and atraumatic.  Eyes: No discharge. Dysconjugate gaze with right eye in lateral field (stable).  Cardiovascular: Irregularly irregular without JVD. Respiratory: Upper airway sounds. Unlabored. GI: Bowel sounds are normal. She exhibits no distension. +PEG c/d/i. Musculoskeletal: She exhibits no edema or tenderness in extremities.  Neurological: She is alert. Right facial droop with right inattention.  Severe dysarthria with limited verbal output, ?expressive aphasia.  Able  to follow simple one step motor commands.  Motor: RUE: 4/5 shoulder abduction, 4-/5 elbow flex/ext, 0/5 wrist extension, hand grip (unchanged). RLE: 4+/5 proximal to distal (stable) LUE/LLE: 5/5 proximal to distal  Skin: Skin is warm and dry. She is not diaphoretic.  Psychiatric: Her mood appears anxious. Her speech is slurred. She is slowed  Assessment/Plan: 1. Functional deficits secondary to left MCA infarct which require 3+ hours per day of interdisciplinary therapy in a comprehensive inpatient rehab setting. Physiatrist is providing close team supervision and 24 hour management of active medical problems listed below. Physiatrist and rehab team continue to assess barriers to discharge/monitor patient progress toward functional and medical goals.  Function:  Bathing Bathing position   Position: Shower  Bathing parts Body parts bathed by patient: Right arm, Chest, Abdomen, Front perineal area, Right upper leg, Left upper leg, Right lower leg, Left lower leg Body parts bathed by helper: Left arm, Buttocks, Back  Bathing assist Assist Level: Touching or steadying assistance(Pt  > 75%)      Upper Body Dressing/Undressing Upper body dressing   What is the patient wearing?: Pull over shirt/dress     Pull over shirt/dress - Perfomed by patient: Put head through opening, Pull shirt over trunk Pull over shirt/dress - Perfomed by helper: Thread/unthread right sleeve, Thread/unthread left sleeve        Upper body assist Assist Level: Touching or steadying assistance(Pt > 75%)      Lower Body Dressing/Undressing Lower body dressing   What is the patient wearing?: Pants, Socks, Shoes     Pants- Performed by patient: Thread/unthread right pants leg Pants- Performed by helper: Thread/unthread left pants leg, Pull pants up/down   Non-skid slipper socks- Performed by helper: Don/doff right sock, Don/doff left sock   Socks - Performed by helper: Don/doff right sock, Don/doff left sock Shoes - Performed by patient: Don/doff left shoe Shoes - Performed by helper: Don/doff right shoe, Don/doff left shoe(elastic laces in place)          Lower body assist Assist for lower body dressing: (MaxA)      Toileting Toileting Toileting activity did not occur: No continent bowel/bladder event   Toileting steps completed by helper: Adjust clothing prior to toileting, Performs perineal hygiene, Adjust clothing after toileting    Toileting assist Assist level: Two helpers   Transfers Chair/bed transfer   Chair/bed transfer method: Ambulatory Chair/bed transfer assist level: Moderate assist (Pt 50 - 74%/lift or lower) Chair/bed transfer assistive device: Armrests, Walker     Locomotion Ambulation Ambulation activity did not occur: Safety/medical concerns   Max distance: 120 Assist level: Moderate assist (Pt 50 - 74%)   Wheelchair   Type: Manual Max wheelchair distance: 150' Assist Level: Supervision or verbal cues  Cognition Comprehension Comprehension assist level: Understands basic 75 - 89% of the time/ requires cueing 10 - 24% of the time  Expression  Expression assist level: Expresses basic 50 - 74% of the time/requires cueing 25 - 49% of the time. Needs to repeat parts of sentences.  Social Interaction Social Interaction assist level: Interacts appropriately 50 - 74% of the time - May be physically or verbally inappropriate.  Problem Solving Problem solving assist level: Solves basic 25 - 49% of the time - needs direction more than half the time to initiate, plan or complete simple activities  Memory Memory assist level: Recognizes or recalls 25 - 49% of the time/requires cueing 50 - 75% of the time    Medical Problem List and Plan: 1.  Hemiparesis, aphasia, dysphagia secondary to Left MCA infarct.   Continue CIR   Attempted to contact patient's son on 2 occasions, will speak to today. Also discussed with nursing director family's request for family conference, plan for next week. 2.  DVT Prophylaxis/Anticoagulation: Pharmaceutical: Other (comment)--Eliquis 3. Pain Management: Tylenol prn  4. Mood: LCSW to follow for evaluation and support.  5. Neuropsych: This patient is not fully capable of making decisions on her own behalf. 6. Skin/Wound Care: Pressure relief measures.Maintain adequate hydration/nutritional status.  7. Fluids/Electrolytes/Nutrition: Adjust tube feeds to maintain adequate nutritional status. Consulted dietitian for input and adjustment.    BMP within acceptable range on 12/17 8. RUL/RLL  aspiration PNA   Scheduled Robitussin-DM to help with cough and secretions    Repeat chest x-ray reviewed, showing persistent right upper lobe disease, but resolution of right lower lobe infiltrate. Spoke to home, will evaluate today to determine further imaging/antibiotics. 9. ?CKD stage III: Monitor renal status with routine checks. Water flushes via PEG.    Creatinine 0.68 on 12/17   Appears to be controlled with adequate hydration 10 Recent GIB: Back on Eliquis.    Hemoglobin 9.9 on 12/20   Continue to monitor 11. Seizure  disorder: stable/controlled on Keppra. 12. A Fib: Monitor HR bid. Continue metoprolol and decrease ASA to 81 mg/day.    Heart rate controlled on 12/20 13. Thrombocytosis: Resolved   Likely reactive.    Will continue to monitor.  14. Dysphagia: NPO (holding bite trials with ST), tolerating tube feeds 15. Protein calorie malnutrition: Chronic with baseline albumin around 2.5 in the past 6 months.    Pre-albumin 16.9 on 12/13.    Continue to monitor 16. COPD (per X ray)/RUL bronchogenic lung CA: completed XRT 06/2017 17. Hypokalemia:    Potassium 4.4 on 12/17, after supplementation   Continue to monitor 18. Leukocytosis: Resolved  WBCs 9.1 on 12/20  Afebrile  Likely from PNA  Cont to monitor 19. Hypoglycemia   Likely secondary to tube feeds   Relatively controlled on 12/21  LOS (Days) 9 A FACE TO FACE EVALUATION WAS PERFORMED  Kayla Kent Kayla Kent 10/07/2017 8:39 AM

## 2017-10-07 NOTE — Progress Notes (Signed)
Name: Kayla Kent MRN: [id number] DOB: 1932/06/12    ADMISSION DATE:  09/28/2017 CONSULTATION DATE:  09/30/2017  REFERRING MD :  Dr. Posey Pronto CIR  CHIEF COMPLAINT:  Abnormal CXR  HISTORY OF PRESENT ILLNESS:  83 year old female with PMH as below, which is significant for atrial fibrillation on DOAC. She was recently admitted to [facility] for GI bleed/colitis and was taken off anticoagulation. Course was then complicated by CVA for which mechanical thrombectomy was performed. Course further complicated by what was presumed to be an aspiration PNA requiring intubation and ESBL klebsiella UTI. Once antibiotics were completed and this improved she was extubated and transferred to CIR for ongoing rehabilitation activities. She had to be re-admitted to [facility] for aspiration PNA 12/3 and was treated with a 10 day course of Levaquin. She was discharged back to CIR. Despite clinical improvement, right sided infiltrates persist on CXR. PCCM asked to see in consultation.   SIGNIFICANT EVENTS  11/20 admit with GIB suspected due to colitis. Course complicated by CVA and asp PNA 11/30 to CIR 12/3 admitted with aspiration PNA 12/10 PEG placed 12/12 back to CIR 12/14 P CCM consulted for right upper lobe pneumonia. 10/06/2017 PC CM called back for persistent pneumonia per radiographic follow-up.  STUDIES:  11/25 CT chest  > compatible with severe multilobar Bronchopneumonia. Moderate bilateral pleural effusions with areas of dependent atelectasis in the lower lobes of lungs bilaterally. Previously treated right upper lobe pulmonary nodule is now obscured by overlying airspace disease.  11/28 modified barium > NPO recommended 12/5 modified barium > NPO recommended   SUBJECTIVE: Sitting in a chair on room air in no acute distress.  Able to phonate without problem.  Noted to have neurological deficits and expressive aphasia.  VITAL SIGNS: Temp:  [97.8 F (36.6 C)-98.2 F (36.8 C)] 98.2 F (36.8  C) (12/21 0500) Pulse Rate:  [66-76] 76 (12/21 0500) Resp:  [16-17] 16 (12/21 0500) BP: (120-130)/(41-50) 130/41 (12/21 0500) SpO2:  [96 %-98 %] 96 % (12/21 0500) Weight:  [44.6 kg (98 lb 5.2 oz)] 44.6 kg (98 lb 5.2 oz) (12/21 0500)  PHYSICAL EXAMINATION: General: Frail cachectic female no acute distress sitting in chair HEENT: No JVD or lymphadenopathy is appreciated. PSY: Dull effect Neuro: Right-sided facial droop, dull effect, speech difficult to understand. CV: Heart sounds are regular regular rate and rhythm PULM: Mild rhonchi on the right.  Reported to have sputum but is not able to clear TI:RWER, non-tender, bsx4 active, PEG in place Extremities: warm/dry, no edema  Skin: no rashes or lesions   Recent Labs  Lab 10/03/17 0521  NA 136  K 4.4  CL 101  CO2 28  BUN 18  CREATININE 0.68  GLUCOSE 110*   Recent Labs  Lab 10/03/17 0521 10/06/17 0658  HGB 10.0* 9.9*  HCT 31.8* 30.7*  WBC 10.7* 9.1  PLT 260 268   Dg Chest 2 View  Result Date: 10/06/2017 CLINICAL DATA:  Fever, cough, congestion EXAM: CHEST  2 VIEW COMPARISON:  09/30/2017 FINDINGS: There is persistent right upper lobe suprahilar airspace opacity. The lungs are hyperinflated likely secondary to COPD. There is a trace right pleural effusion. There is no left pleural effusion. There is no pneumothorax. There is stable cardiomegaly. There is thoracic aortic atherosclerosis. There is no acute osseous abnormality. There is generalized osteopenia. IMPRESSION: Persistent right upper lobe pneumonia. Right upper lobe pulmonary nodule is obscured by the area of airspace disease. Interval improved aeration of the right lower lobe.  Followup PA and lateral chest X-ray is recommended in 3-4 weeks following trial of antibiotic therapy to ensure resolution and exclude underlying malignancy. Electronically Signed   By: Kathreen Devoid   On: 10/06/2017 10:00    ASSESSMENT / PLAN: Pulmonary critical care called on 09/30/2017 to  evaluate for persistent right sided pulmonary infiltrates.  She has been continued on Levaquin since that time.  She has no fevers chills or sweats.  Reported to have some increase in sputum production but swallows it.  She is in no distress whatsoever. Chest x-ray performed on 10/06/2017 shows improved aeration but persistent right upper lobe infiltrate.  But she has a history of right upper pulmonary nodule 04/09/2017. Patient noted to have dysphagia has a PEG tube in place and is on tube feedings. History of GI bleed Presumed pneumonia with questionable aspiration on ongoing basis and a pulmonary nodule in the right upper lobe. I doubt that fiberoptic bronchoscopy and biopsy is a viable option at this time secondary to her age and multiple medical comorbidities.    10/02/2017 pro-calcitonin was less than 0.10, WBC is 9.1.  Afebrile hemodynamically stable not requiring oxygen supplement. Repeat CT of the chest.  Question if this could also be some type of postobstructive process from right upper lobe pulmonary nodule. Consider DC antibiotics at this time and restart if becomes febrile.  Richardson Landry Minor ACNP Maryanna Shape PCCM Pager [phone] till 1 pm If no answer page [phone] 10/07/2017, 8:51 AM

## 2017-10-07 NOTE — Plan of Care (Signed)
  Not Progressing RH BOWEL ELIMINATION RH STG MANAGE BOWEL WITH ASSISTANCE Description STG Manage Bowel with total Assistance.    10/07/2017 1203 - Not Progressing by Brita Romp, RN Pt continues to have episodes of incontinence RH BLADDER ELIMINATION RH STG MANAGE BLADDER WITH ASSISTANCE Description STG Manage Bladder With total Assistance    10/07/2017 1203 - Not Progressing by Brita Romp, RN Pt continues to have urgency and incontinent episodes

## 2017-10-08 ENCOUNTER — Inpatient Hospital Stay (HOSPITAL_COMMUNITY): Payer: Self-pay | Admitting: Occupational Therapy

## 2017-10-08 DIAGNOSIS — I69922 Dysarthria following unspecified cerebrovascular disease: Secondary | ICD-10-CM

## 2017-10-08 DIAGNOSIS — I6350 Cerebral infarction due to unspecified occlusion or stenosis of unspecified cerebral artery: Secondary | ICD-10-CM

## 2017-10-08 DIAGNOSIS — I69991 Dysphagia following unspecified cerebrovascular disease: Secondary | ICD-10-CM

## 2017-10-08 DIAGNOSIS — R350 Frequency of micturition: Secondary | ICD-10-CM

## 2017-10-08 LAB — GLUCOSE, CAPILLARY
GLUCOSE-CAPILLARY: 84 mg/dL (ref 65–99)
GLUCOSE-CAPILLARY: 116 mg/dL — AB (ref 65–99)
GLUCOSE-CAPILLARY: 87 mg/dL (ref 65–99)
Glucose-Capillary: 105 mg/dL — ABNORMAL HIGH (ref 65–99)
Glucose-Capillary: 118 mg/dL — ABNORMAL HIGH (ref 65–99)
Glucose-Capillary: 121 mg/dL — ABNORMAL HIGH (ref 65–99)

## 2017-10-08 MED ORDER — POLYMYXIN B-TRIMETHOPRIM 10000-0.1 UNIT/ML-% OP SOLN
1.0000 [drp] | Freq: Four times a day (QID) | OPHTHALMIC | Status: DC
  Administered 2017-10-08 – 2017-10-24 (×60): 1 [drp] via OPHTHALMIC
  Filled 2017-10-08 (×2): qty 10

## 2017-10-08 MED ORDER — OXYBUTYNIN CHLORIDE ER 10 MG PO TB24: 10.0000 mg | ORAL_TABLET | Freq: Every day | ORAL | Status: DC

## 2017-10-08 MED ORDER — OXYBUTYNIN CHLORIDE 5 MG PO TABS
5.0000 mg | ORAL_TABLET | Freq: Two times a day (BID) | ORAL | Status: DC
  Administered 2017-10-08 – 2017-10-12 (×10): 5 mg
  Filled 2017-10-08 (×10): qty 1

## 2017-10-08 NOTE — Progress Notes (Signed)
Occupational Therapy Session Note  Patient Details  Name: Kayla Kent MRN: [id number] Date of Birth: 1932-08-18  Today's Date: 10/08/2017 OT Individual Time: 1001-1100 OT Individual Time Calculation (min): 59 min    Short Term Goals: Week 2:  OT Short Term Goal 1 (Week 2): Pt will perform LB dressing with mod assist for gripper socks and pull up pants.   OT Short Term Goal 2 (Week 2): Pt will complete LB bathing sit to stand with min assist 2 consecutive sessions.  OT Short Term Goal 3 (Week 2): Pt will complete toileting with min assist sit to stand.   OT Short Term Goal 4 (Week 2): Pt will donn pullover shirt with supervision and mod instructional cueing.  Skilled Therapeutic Interventions/Progress Updates:    Treatment session focused on ADL/self care training, transfer training, activity tolerance, balance training, NMR, and pt education. Upon entering room, pt supine in bed with HOB elevated and moved to sitting EOB using bed rails and mod A for LB/UB guiding. Pt tolerated upright sitting EOB to transfer to w/c using STP and mod A. Pt completed UB washing with min A and hand over hand to L UE to facilitate NMR. Pt required mod A for sit<>stand transfer at sink for LB washing task. Pt required mod A for L sided lean during standing task. Therapist provided v and t/c for self correcting upright posture for equal weight bearing. Pt completed LB dressing and able to loop underwear with S, but required mod A for looping and pulling up pants in sit/stand level at sink. Pt instructed on use of reacher for LB dressing and administered reacher for p to use in hospital. Pt indicated that she has one she uses at home. Grooming task completed at sinkside with Set Up A, pt requested make up be done and required mod A. Pt left in w/c on side of bed with quick release belt and call bell within reach.    Therapy Documentation Precautions:  Precautions Precautions: Fall Precaution Comments: R  hemiplegia, expressive aphasis,  Restrictions Weight Bearing Restrictions: No    Vital Signs: Oxygen Therapy O2 Device: Not Delivered Pain: Pain Assessment Pain Assessment: No/denies pain  See Function Navigator for Current Functional Status.   Therapy/Group: Individual Therapy  Delon Sacramento 10/08/2017, 12:35 PM

## 2017-10-08 NOTE — Progress Notes (Addendum)
[place] PHYSICAL MEDICINE & REHABILITATION     PROGRESS NOTE  Subjective/Complaints:  Patient seen lying in bed this morning. She states she slept well overnight. Urinary frequency reported yesterday when nursing.  ROS: Appears to deny CP, SOB, N/V/D.  Objective: Vital Signs: Blood pressure (!) 120/40, pulse 83, temperature 98 F (36.7 C), temperature source Oral, resp. rate 18, height 5\' 7"  (1.702 m), weight 43.8 kg (96 lb 9 oz), SpO2 97 %. Dg Chest 2 View  Result Date: 10/06/2017 CLINICAL DATA:  Fever, cough, congestion EXAM: CHEST  2 VIEW COMPARISON:  09/30/2017 FINDINGS: There is persistent right upper lobe suprahilar airspace opacity. The lungs are hyperinflated likely secondary to COPD. There is a trace right pleural effusion. There is no left pleural effusion. There is no pneumothorax. There is stable cardiomegaly. There is thoracic aortic atherosclerosis. There is no acute osseous abnormality. There is generalized osteopenia. IMPRESSION: Persistent right upper lobe pneumonia. Right upper lobe pulmonary nodule is obscured by the area of airspace disease. Interval improved aeration of the right lower lobe. Followup PA and lateral chest X-ray is recommended in 3-4 weeks following trial of antibiotic therapy to ensure resolution and exclude underlying malignancy. Electronically Signed   By: Kathreen Devoid   On: 10/06/2017 10:00   Recent Labs    10/06/17 0658  WBC 9.1  HGB 9.9*  HCT 30.7*  PLT 268   No results for input(s): NA, K, CL, GLUCOSE, BUN, CREATININE, CALCIUM in the last 72 hours.  Invalid input(s): CO CBG (last 3)  Recent Labs    10/07/17 2032 10/08/17 0044 10/08/17 0600  GLUCAP 99 118* 84    Wt Readings from Last 3 Encounters:  10/08/17 43.8 kg (96 lb 9 oz)  09/28/17 46.4 kg (102 lb 4.7 oz)  09/19/17 49.8 kg (109 lb 12.6 oz)    Physical Exam:  BP (!) 120/40 (BP Location: Left Arm)   Pulse 83   Temp 98 F (36.7 C) (Oral)   Resp 18   Ht 5\' 7"  (1.702 m)    Wt 43.8 kg (96 lb 9 oz)   SpO2 97%   BMI 15.12 kg/m  Constitutional: She appears well-developed. She appears cachectic. NAD.  HENT: Normocephalic and atraumatic.  Eyes: No discharge. Dysconjugate gaze with right eye in lateral field (stable).  Cardiovascular: Irregularly irregular without JVD. Respiratory: Upper airway sounds. Unlabored. GI: Bowel sounds are normal. She exhibits no distension. +PEG c/d/i. Musculoskeletal: She exhibits no edema or tenderness in extremities.  Neurological: She is alert. Right facial droop with right inattention, improving.  Severe dysarthria with limited verbal output, ?expressive aphasia.  Able  to follow simple one step motor commands.  Motor: RUE: 4/5 shoulder abduction, 4-/5 elbow flex/ext, 1/5 wrist extension, 2/5 hand grip. RLE: 4+/5 proximal to distal (stable) LUE/LLE: 5/5 proximal to distal  Skin: Skin is warm and dry. She is not diaphoretic.  Psychiatric: She is slowed  Assessment/Plan: 1. Functional deficits secondary to left MCA infarct which require 3+ hours per day of interdisciplinary therapy in a comprehensive inpatient rehab setting. Physiatrist is providing close team supervision and 24 hour management of active medical problems listed below. Physiatrist and rehab team continue to assess barriers to discharge/monitor patient progress toward functional and medical goals.  Function:  Bathing Bathing position   Position: Wheelchair/chair at sink  Bathing parts Body parts bathed by patient: Right arm, Chest, Abdomen, Right upper leg, Left upper leg, Right lower leg, Left lower leg Body parts bathed by helper: Left arm,  Buttocks, Front perineal area  Bathing assist Assist Level: Touching or steadying assistance(Pt > 75%)      Upper Body Dressing/Undressing Upper body dressing   What is the patient wearing?: Pull over shirt/dress     Pull over shirt/dress - Perfomed by patient: Thread/unthread left sleeve, Put head through opening,  Pull shirt over trunk Pull over shirt/dress - Perfomed by helper: Thread/unthread right sleeve        Upper body assist Assist Level: Touching or steadying assistance(Pt > 75%)      Lower Body Dressing/Undressing Lower body dressing   What is the patient wearing?: Pants, Socks, Shoes     Pants- Performed by patient: Thread/unthread right pants leg, Thread/unthread left pants leg Pants- Performed by helper: Pull pants up/down   Non-skid slipper socks- Performed by helper: Don/doff right sock, Don/doff left sock   Socks - Performed by helper: Don/doff left sock, Don/doff right sock Shoes - Performed by patient: Don/doff left shoe Shoes - Performed by helper: Don/doff right shoe, Don/doff left shoe          Lower body assist Assist for lower body dressing: (MaxA)      Toileting Toileting Toileting activity did not occur: No continent bowel/bladder event   Toileting steps completed by helper: Adjust clothing prior to toileting, Performs perineal hygiene, Adjust clothing after toileting    Toileting assist Assist level: Two helpers   Transfers Chair/bed transfer   Chair/bed transfer method: Stand pivot Chair/bed transfer assist level: Moderate assist (Pt 50 - 74%/lift or lower) Chair/bed transfer assistive device: Armrests     Locomotion Ambulation Ambulation activity did not occur: Safety/medical concerns   Max distance: 120 Assist level: Moderate assist (Pt 50 - 74%)   Wheelchair   Type: Manual Max wheelchair distance: 150 Assist Level: Touching or steadying assistance (Pt > 75%)  Cognition Comprehension Comprehension assist level: Understands basic 75 - 89% of the time/ requires cueing 10 - 24% of the time  Expression Expression assist level: Expresses basic 50 - 74% of the time/requires cueing 25 - 49% of the time. Needs to repeat parts of sentences.  Social Interaction Social Interaction assist level: Interacts appropriately 75 - 89% of the time - Needs  redirection for appropriate language or to initiate interaction.  Problem Solving Problem solving assist level: Solves basic 25 - 49% of the time - needs direction more than half the time to initiate, plan or complete simple activities  Memory Memory assist level: Recognizes or recalls 25 - 49% of the time/requires cueing 50 - 75% of the time    Medical Problem List and Plan: 1.  Hemiparesis, aphasia, dysphagia secondary to Left MCA infarct.   Continue CIR 2.  DVT Prophylaxis/Anticoagulation: Pharmaceutical: Other (comment)--Eliquis 3. Pain Management: Tylenol prn  4. Mood: LCSW to follow for evaluation and support.  5. Neuropsych: This patient is not fully capable of making decisions on her own behalf. 6. Skin/Wound Care: Pressure relief measures.Maintain adequate hydration/nutritional status.  7. Fluids/Electrolytes/Nutrition: Adjust tube feeds to maintain adequate nutritional status. Consulted dietitian for input and adjustment.    BMP within acceptable range on 12/17 8. RUL/RLL aspiration PNA   Scheduled Robitussin-DM to help with cough and secretions    Repeat chest x-ray reviewed, showing persistent right upper lobe disease, but resolution of right lower lobe infiltrate. Per Pulm, pro calcitonin ordered, reviewed, WNL, antibiotics DC'd. No need for further imaging at this time. Follow-up CT in 3 months   Continue to monitor 9. ?CKD stage III: Monitor  renal status with routine checks. Water flushes via PEG.    Creatinine 0.68 on 12/17   Appears to be controlled with adequate hydration 10 Recent GIB: Back on Eliquis.    Hemoglobin 9.9 on 12/20   Continue to monitor 11. Seizure disorder: stable/controlled on Keppra. 12. A Fib: Monitor HR bid. Continue metoprolol and decrease ASA to 81 mg/day.    Heart rate controlled on 12/22 13. Thrombocytosis: Resolved   Likely reactive.    Will continue to monitor.  14. Dysphagia: NPO (holding bite trials with ST), tolerating tube feeds 15.  Protein calorie malnutrition: Chronic with baseline albumin around 2.5 in the past 6 months.    Pre-albumin 16.9 on 12/13.    Continue to monitor 16. COPD (per X ray)/RUL bronchogenic lung CA: completed XRT 06/2017 17. Hypokalemia:    Potassium 4.4 on 12/17, after supplementation   Continue to monitor 18. Leukocytosis: Resolved  WBCs 9.1 on 12/20  Afebrile  Likely from PNA  Cont to monitor 19. Hypoglycemia   Likely secondary to tube feeds   Relatively controlled on 12/21 20. Urinary frequency  Home Ditropan restarted on 12/22  LOS (Days) 10 A FACE TO FACE EVALUATION WAS PERFORMED  Ankit Lorie Phenix 10/08/2017 7:26 AM

## 2017-10-09 ENCOUNTER — Inpatient Hospital Stay (HOSPITAL_COMMUNITY): Payer: Medicare Other

## 2017-10-09 ENCOUNTER — Inpatient Hospital Stay (HOSPITAL_COMMUNITY): Payer: Self-pay | Admitting: Physical Therapy

## 2017-10-09 DIAGNOSIS — R410 Disorientation, unspecified: Secondary | ICD-10-CM

## 2017-10-09 LAB — URINALYSIS, ROUTINE W REFLEX MICROSCOPIC
Bilirubin Urine: NEGATIVE
GLUCOSE, UA: NEGATIVE mg/dL
Hgb urine dipstick: NEGATIVE
KETONES UR: NEGATIVE mg/dL
LEUKOCYTES UA: NEGATIVE
NITRITE: NEGATIVE
PH: 7 (ref 5.0–8.0)
Protein, ur: NEGATIVE mg/dL
SPECIFIC GRAVITY, URINE: 1.014 (ref 1.005–1.030)

## 2017-10-09 LAB — GLUCOSE, CAPILLARY
GLUCOSE-CAPILLARY: 121 mg/dL — AB (ref 65–99)
GLUCOSE-CAPILLARY: 117 mg/dL — AB (ref 65–99)
Glucose-Capillary: 107 mg/dL — ABNORMAL HIGH (ref 65–99)
Glucose-Capillary: 133 mg/dL — ABNORMAL HIGH (ref 65–99)
Glucose-Capillary: 109 mg/dL — ABNORMAL HIGH (ref 65–99)

## 2017-10-09 MED ORDER — NON FORMULARY
1.5000 mg | Freq: Every day | Status: DC
Start: 2017-10-09 — End: 2017-10-09

## 2017-10-09 MED ORDER — MELATONIN 3 MG PO TABS
1.5000 mg | ORAL_TABLET | Freq: Every day | ORAL | Status: DC
  Administered 2017-10-09 – 2017-10-23 (×14): 1.5 mg via ORAL
  Filled 2017-10-09 (×15): qty 0.5

## 2017-10-09 NOTE — Progress Notes (Signed)
Patient was very restless during the night. She removed the covers, her heel protection & other items from her bed numerous times, was on the call bell without any need found or expressed, calling out into the hallway multiple times, and stating that she was wet when not. Various times, nurse tech and nurse went to her room & patient would have nodded off or would shake her head when asked various questions trying to find out what she needed. She called her daughter, who in turn called the front desk asking questions about her status. She stated that her Mom stated that she did not know where she was. She asked for someone to go into the room. The nurse informed the daughter that the tech was in the room at the time & gave the patient the phone. She asked about a urine test, by which earlier in the shift, she stated that one was supposed to be done yesterday. She was told that a message would be made to the physician to let him that the family requested a urine test due to previous UTI. When this nurse hung up with the daughter [name], the son called & informed this nurse that it was 3am in the morning where he was & that his mother called him ( she was on the line).  He stated that this is how she acts when she has a UTI & wants her to be checked & why the doctor was not called when we noticed a change in condition. Patient cannot express her feelings & is becoming more confused & he also was concerned about her receiving insulin earlier for a blood sugar of 121. In report from [place], it was passed on that the patient is restless at night, calls multiple times with no explanation, & is anxious, most times after family has left. Informed the son that as per report, it was not seen as a change in condition. He asked about whether she was given her prn anxiety medication & she had. The blood sugar was explained again due to the parameters that was set for sensitive setting due to peg tube feeding. She  only was to receive 1 unit. He asked about her flushes & was informed of those parameters. He then again asked if someone could go check on her & this nurse informed him that the nurse tech was in the room & the nurse tech heard him, answered & that it was 3am in the morning. He asked his mother a few questions that she attempted to answer & then gave the phone back to the nurse tech. He stated that tonight was the first time that she received insulin & when attempted to inform him that his statement  was not correct, he stopped me. He asked if she was warm or flushed, for which the tech told him no. He then asked if we had it under control & he was informed that his sister had already been informed that the physician would be informed of the situation & a request for a urinalysis. He hung up. The on call provider was contacted & a verbal order given for a UA & C&S was given, catheterize if necessary. UA was taken by straight catheter & sent to the lab. On-coming nurse informed of events & lab tests.

## 2017-10-09 NOTE — Progress Notes (Signed)
At the beginning of the shift, the patients daughter asked a lot of questions about the patients blood sugars and wanting a urine test. The patients blood sugar result was 121mg /dl & was scheduled to be given 1 unit as per hyperglycemic protocol. Blood sugars are checked every 4 hours due to peg tube feedings. The settings of the protocol was explained & daughter stated that she didn't want her mother to become diabetic. Informed daughter that if her blood sugars were elevated, the provider would make changes & that tube feeding formulas can elevate the blood sugars, as to why they were checked so frequently & the strict, sensitive setting she was on. She asked about a urinalysis result & the chart was checked & there was none since 12/17. She stated that she was to get one yesterday 10/08/17 due to previous UTI & restless behaviors Informed her that the provider would be informed of her request.

## 2017-10-09 NOTE — Progress Notes (Signed)
[facility] PHYSICAL MEDICINE & REHABILITATION     PROGRESS NOTE  Subjective/Complaints:  Patient seen lying in bed this morning. Pt restless overnight. Patient trying to indicate something to me. Per nurse, daughter called stating patient was confused.  ROS: Appears to deny CP, SOB, N/V/D.  Objective: Vital Signs: Blood pressure (!) 103/34, pulse 71, temperature 97.6 F (36.4 C), temperature source Oral, resp. rate 18, height 5\' 7"  (1.702 m), weight 44.6 kg (98 lb 5.2 oz), SpO2 96 %. No results found. No results for input(s): WBC, HGB, HCT, PLT in the last 72 hours. No results for input(s): NA, K, CL, GLUCOSE, BUN, CREATININE, CALCIUM in the last 72 hours.  Invalid input(s): CO CBG (last 3)  Recent Labs    10/08/17 2354 10/09/17 0351 10/09/17 0724  GLUCAP 105* 107* 133*    Wt Readings from Last 3 Encounters:  10/09/17 44.6 kg (98 lb 5.2 oz)  09/28/17 46.4 kg (102 lb 4.7 oz)  09/19/17 49.8 kg (109 lb 12.6 oz)    Physical Exam:  BP (!) 103/34 (BP Location: Left Arm)   Pulse 71   Temp 97.6 F (36.4 C) (Oral)   Resp 18   Ht 5\' 7"  (1.702 m)   Wt 44.6 kg (98 lb 5.2 oz)   SpO2 96%   BMI 15.40 kg/m  Constitutional: She appears well-developed. She appears cachectic. NAD.  HENT: Normocephalic and atraumatic.  Eyes: No discharge. Dysconjugate gaze with right eye in lateral field (stable).  Cardiovascular: Irregularly irregular without JVD. Respiratory: Upper airway sounds. Unlabored. GI: Bowel sounds are normal. She exhibits no distension. +PEG c/d/i. Musculoskeletal: She exhibits no edema or tenderness in extremities.  Neurological: She is alert. Right facial droop with right inattention, improving.  Severe dysarthria with limited verbal output, ?expressive aphasia.  Able  to follow simple one step motor commands.  Motor: RUE: 4/5 shoulder abduction, 4-/5 elbow flex/ext, 1/5 wrist extension, 2/5 hand grip (stable). RLE: 4+/5 proximal to distal (stable) LUE/LLE: 5/5  proximal to distal  Skin: Skin is warm and dry. She is not diaphoretic.  Psychiatric: She is slowed  Assessment/Plan: 1. Functional deficits secondary to left MCA infarct which require 3+ hours per day of interdisciplinary therapy in a comprehensive inpatient rehab setting. Physiatrist is providing close team supervision and 24 hour management of active medical problems listed below. Physiatrist and rehab team continue to assess barriers to discharge/monitor patient progress toward functional and medical goals.  Function:  Bathing Bathing position   Position: Wheelchair/chair at sink  Bathing parts Body parts bathed by patient: Right arm, Chest, Abdomen, Right upper leg, Left upper leg, Right lower leg, Left lower leg Body parts bathed by helper: Left arm, Buttocks, Front perineal area, Back  Bathing assist Assist Level: Touching or steadying assistance(Pt > 75%)      Upper Body Dressing/Undressing Upper body dressing   What is the patient wearing?: Pull over shirt/dress     Pull over shirt/dress - Perfomed by patient: Thread/unthread left sleeve, Put head through opening, Pull shirt over trunk Pull over shirt/dress - Perfomed by helper: Thread/unthread right sleeve        Upper body assist Assist Level: Touching or steadying assistance(Pt > 75%)      Lower Body Dressing/Undressing Lower body dressing   What is the patient wearing?: Pants, Socks, Shoes     Pants- Performed by patient: Thread/unthread right pants leg, Thread/unthread left pants leg Pants- Performed by helper: Pull pants up/down   Non-skid slipper socks- Performed by helper:  Don/doff right sock, Don/doff left sock   Socks - Performed by helper: Don/doff left sock, Don/doff right sock Shoes - Performed by patient: Don/doff left shoe Shoes - Performed by helper: Don/doff right shoe, Don/doff left shoe          Lower body assist Assist for lower body dressing: Touching or steadying assistance (Pt > 75%)       Toileting Toileting Toileting activity did not occur: No continent bowel/bladder event   Toileting steps completed by helper: Adjust clothing prior to toileting, Performs perineal hygiene, Adjust clothing after toileting    Toileting assist Assist level: Two helpers   Transfers Chair/bed transfer   Chair/bed transfer method: Stand pivot Chair/bed transfer assist level: Moderate assist (Pt 50 - 74%/lift or lower) Chair/bed transfer assistive device: Armrests     Locomotion Ambulation Ambulation activity did not occur: Safety/medical concerns   Max distance: 120 Assist level: Moderate assist (Pt 50 - 74%)   Wheelchair   Type: Manual Max wheelchair distance: 150 Assist Level: Touching or steadying assistance (Pt > 75%)  Cognition Comprehension Comprehension assist level: Understands basic 75 - 89% of the time/ requires cueing 10 - 24% of the time  Expression Expression assist level: Expresses basic 50 - 74% of the time/requires cueing 25 - 49% of the time. Needs to repeat parts of sentences.  Social Interaction Social Interaction assist level: Interacts appropriately 75 - 89% of the time - Needs redirection for appropriate language or to initiate interaction.  Problem Solving Problem solving assist level: Solves basic 25 - 49% of the time - needs direction more than half the time to initiate, plan or complete simple activities  Memory Memory assist level: Recognizes or recalls 25 - 49% of the time/requires cueing 50 - 75% of the time    Medical Problem List and Plan: 1.  Hemiparesis, aphasia, dysphagia secondary to Left MCA infarct.   Continue CIR 2.  DVT Prophylaxis/Anticoagulation: Pharmaceutical: Other (comment)--Eliquis 3. Pain Management: Tylenol prn  4. Mood: LCSW to follow for evaluation and support.  5. Neuropsych: This patient is not fully capable of making decisions on her own behalf. 6. Skin/Wound Care: Pressure relief measures.Maintain adequate  hydration/nutritional status.  7. Fluids/Electrolytes/Nutrition: Adjust tube feeds to maintain adequate nutritional status. Consulted dietitian for input and adjustment.    BMP within acceptable range on 12/17   Labs ordered for tomorrow 8. RUL/RLL aspiration PNA   Scheduled Robitussin-DM to help with cough and secretions    Repeat chest x-ray reviewed, showing persistent right upper lobe disease, but resolution of right lower lobe infiltrate. Per Pulm, pro calcitonin ordered, reviewed, WNL, antibiotics DC'd. No need for further imaging at this time. Follow-up CT in 3 months   Repeat chest x-ray ordered now that and abx have been stopped   Continue to monitor 9. ?CKD stage III: Monitor renal status with routine checks. Water flushes via PEG.    Creatinine 0.68 on 12/17   Labs ordered for tomorrow   Appears to be controlled with adequate hydration 10 Recent GIB: Back on Eliquis.    Hemoglobin 9.9 on 12/20   Labs ordered for tomorrow   Continue to monitor 11. Seizure disorder: stable/controlled on Keppra. 12. A Fib: Monitor HR bid. Continue metoprolol and decrease ASA to 81 mg/day.    Heart rate controlled on 12/22 13. Thrombocytosis: Resolved   Likely reactive.    Will continue to monitor.  14. Dysphagia: NPO (holding bite trials with ST), tolerating tube feeds 15. Protein calorie malnutrition:  Chronic with baseline albumin around 2.5 in the past 6 months.    Pre-albumin 16.9 on 12/13.    Continue to monitor 16. COPD (per X ray)/RUL bronchogenic lung CA: completed XRT 06/2017 17. Hypokalemia:    Potassium 4.4 on 12/17, after supplementation   Continue to monitor 18. Leukocytosis: Resolved  WBCs 9.1 on 12/20  Afebrile  Likely from PNA  Cont to monitor 19. Hyperglycemia   Likely secondary to tube feeds   Relatively controlled on 12/22 20. Urinary frequency  Home Ditropan restarted on 12/22 21. Confusion per her daughter   UA unremarkable, urine culture pending  LOS (Days)  11 A FACE TO FACE EVALUATION WAS PERFORMED  Kayla Kent 10/09/2017 7:36 AM

## 2017-10-09 NOTE — Progress Notes (Signed)
Physical Therapy Session Note  Patient Details  Name: Kayla Kent MRN: [id number] Date of Birth: [birth year]  Today's Date: 10/09/2017 PT Individual Time: 1445-1530 PT Individual Time Calculation (min): 45 min   Short Term Goals: Week 1:  PT Short Term Goal 1 (Week 1): Pt will maintain static sitting balance w/ supervision PT Short Term Goal 2 (Week 1): Pt will transfer bed<>chair w/ mod assist PT Short Term Goal 3 (Week 1): Pt will self-propel w/c using L hemi technique w/ mod assist PT Short Term Goal 4 (Week 1): Pt will tolerate sitting OOB in between therapies for 2 hours at a time  PT Short Term Goal 5 (Week 1): Pt will maintain neutral trunk alignment in sitting or standing w/ minimal cues in 50% of trials   Skilled Therapeutic Interventions/Progress Updates:    Pt supine in bed upon therapist arrival. Pt reports no pain this PM. Pt indicates she has had incontinence in her brief, rolling R/L with Supervision for brief change and pericare for urine incontinence. Supine to sit Mod Assist for trunk support. Squat pivot transfer bed to w/c Mod Assist with verbal cues for how to perform transfer for safety. Manual w/c propulsion 2 x 150 ft with SBA, v/c for technique and to attend to R side obstacles. Sit to/from stand x 5 reps from lowered mat table with Min to Mod Assist, focus on LE placement during transfer and standing balance with RW with hand splint for RUE. Ambulation 2 x 30 ft with RW and Mod Assist, some path deviation to the R. Focus on turning safely to sit with RW after ambulation, pt requires max cueing for taking steps backwards and to safely find chair before sitting. Pt left supine in bed with needs in reach at end of therapy session.  Therapy Documentation Precautions:  Precautions Precautions: Fall Precaution Comments: R hemiplegia, expressive aphasis,  Restrictions Weight Bearing Restrictions: No  See Function Navigator for Current Functional  Status.   Therapy/Group: Individual Therapy   Excell Seltzer, PT, DPT  10/09/2017, 3:42 PM

## 2017-10-10 ENCOUNTER — Inpatient Hospital Stay (HOSPITAL_COMMUNITY): Payer: Self-pay

## 2017-10-10 ENCOUNTER — Inpatient Hospital Stay (HOSPITAL_COMMUNITY): Payer: Medicare Other | Admitting: Speech Pathology

## 2017-10-10 ENCOUNTER — Inpatient Hospital Stay (HOSPITAL_COMMUNITY): Payer: Self-pay | Admitting: Occupational Therapy

## 2017-10-10 ENCOUNTER — Other Ambulatory Visit: Payer: Self-pay

## 2017-10-10 LAB — CBC WITH DIFFERENTIAL/PLATELET
Basophils Absolute: 0 10*3/uL (ref 0.0–0.1)
Basophils Relative: 1 %
EOS ABS: 0.1 10*3/uL (ref 0.0–0.7)
EOS PCT: 1 %
HCT: 34.9 % — ABNORMAL LOW (ref 36.0–46.0)
HEMOGLOBIN: 10.9 g/dL — AB (ref 12.0–15.0)
LYMPHS ABS: 1.4 10*3/uL (ref 0.7–4.0)
LYMPHS PCT: 22 %
MCH: 30.3 pg (ref 26.0–34.0)
MCHC: 31.2 g/dL (ref 30.0–36.0)
MCV: 96.9 fL (ref 78.0–100.0)
Monocytes Absolute: 0.6 10*3/uL (ref 0.1–1.0)
Monocytes Relative: 9 %
NEUTROS PCT: 67 %
Neutro Abs: 4.4 10*3/uL (ref 1.7–7.7)
PLATELETS: 360 10*3/uL (ref 150–400)
RBC: 3.6 MIL/uL — ABNORMAL LOW (ref 3.87–5.11)
RDW: 15.4 % (ref 11.5–15.5)
WBC: 6.5 10*3/uL (ref 4.0–10.5)

## 2017-10-10 LAB — BASIC METABOLIC PANEL
Anion gap: 10 (ref 5–15)
BUN: 25 mg/dL — AB (ref 6–20)
CHLORIDE: 98 mmol/L — AB (ref 101–111)
CO2: 27 mmol/L (ref 22–32)
CREATININE: 0.67 mg/dL (ref 0.44–1.00)
Calcium: 8.7 mg/dL — ABNORMAL LOW (ref 8.9–10.3)
Glucose, Bld: 123 mg/dL — ABNORMAL HIGH (ref 65–99)
POTASSIUM: 4.5 mmol/L (ref 3.5–5.1)
SODIUM: 135 mmol/L (ref 135–145)

## 2017-10-10 LAB — URINE CULTURE: CULTURE: NO GROWTH

## 2017-10-10 LAB — GLUCOSE, CAPILLARY
GLUCOSE-CAPILLARY: 118 mg/dL — AB (ref 65–99)
GLUCOSE-CAPILLARY: 87 mg/dL (ref 65–99)
GLUCOSE-CAPILLARY: 107 mg/dL — AB (ref 65–99)
Glucose-Capillary: 115 mg/dL — ABNORMAL HIGH (ref 65–99)
Glucose-Capillary: 132 mg/dL — ABNORMAL HIGH (ref 65–99)
Glucose-Capillary: 97 mg/dL (ref 65–99)

## 2017-10-10 NOTE — Progress Notes (Addendum)
[place] [facility]ILITATION     PROGRESS NOTE  Subjective/Complaints:  Patient seen sitting up in her chair working with therapies this morning. She states she slept well overnight. She appears to be more aware this morning. Per OT, patient with increased pushing.  ROS: Appears to deny CP, SOB, N/V/D.  Objective: Vital Signs: Blood pressure (!) 121/59, pulse 74, temperature 97.6 F (36.4 C), temperature source Oral, resp. rate 18, height 5\' 7"  (1.702 m), weight 42.4 kg (93 lb 7.6 oz), SpO2 97 %. Dg Chest 2 View  Result Date: 10/09/2017 CLINICAL DATA:  83 year old female with confusion EXAM: CHEST  2 VIEW COMPARISON:  Prior chest x-ray 10/06/2017 ; chest CT 09/11/2017 and 08/23/2017 FINDINGS: Persistent patchy airspace opacity in the posterior right upper lobe in the region of the previously identified pulmonary nodule. Stable cardiomegaly and mediastinal contours. Aortic atherosclerosis again noted. No new airspace consolidation, or recurrent pleural effusion. Mild vascular congestion without overt edema. Chronic bronchitic changes and interstitial prominence are similar compared to prior. Stable biapical pleuroparenchymal scarring. No acute osseous abnormality. IMPRESSION: No significant interval change in the appearance of the chest compared to 10/06/2017. Persistent patchy airspace opacity in the posterior right upper lobe in the region of the previously identified pulmonary nodule. This may represent residual pneumonia, pleuroparenchymal scarring from the recent pneumonia, or progression of the patient's underlying pulmonary nodule/suspected bronchogenic carcinoma. Stable cardiomegaly and chronic pulmonary parenchymal disease. Electronically Signed   By: Jacqulynn Cadet M.D.   On: 10/09/2017 09:16   Recent Labs    10/10/17 0615  WBC 6.5  HGB 10.9*  HCT 34.9*  PLT 360   Recent Labs    10/10/17 0615  NA 135  K 4.5  CL 98*  GLUCOSE 123*  BUN 25*  CREATININE 0.67   CALCIUM 8.7*   CBG (last 3)  Recent Labs    10/09/17 1625 10/09/17 2102 10/10/17 0643  GLUCAP 117* 109* 118*    Wt Readings from Last 3 Encounters:  10/10/17 42.4 kg (93 lb 7.6 oz)  09/28/17 46.4 kg (102 lb 4.7 oz)  09/19/17 49.8 kg (109 lb 12.6 oz)    Physical Exam:  BP (!) 121/59 (BP Location: Left Arm)   Pulse 74   Temp 97.6 F (36.4 C) (Oral)   Resp 18   Ht 5\' 7"  (1.702 m)   Wt 42.4 kg (93 lb 7.6 oz)   SpO2 97%   BMI 14.64 kg/m  Constitutional: She appears well-developed. She appears cachectic. NAD.  HENT: Normocephalic and atraumatic.  Eyes: No discharge. Dysconjugate gaze with right eye in lateral field (stable).  Cardiovascular: Irregularly irregular without JVD. Respiratory: Upper airway sounds. Unlabored. GI: Bowel sounds are normal. She exhibits no distension. +PEG c/d/i. Musculoskeletal: She exhibits no edema or tenderness in extremities.  Neurological: She is alert. Right facial droop with right inattention, improving.  Severe dysarthria with limited verbal output, ?expressive aphasia.  Able  to follow simple one step motor commands.  Motor: RUE: 4/5 shoulder abduction, 4-/5 elbow flex/ext, 1/5 wrist extension, 2/5 hand grip (unchanged). RLE: 4+/5 proximal to distal (stable) LUE/LLE: 5/5 proximal to distal  Skin: Skin is warm and dry. She is not diaphoretic.  Psychiatric: She is slowed  Assessment/Plan: 1. Functional deficits secondary to left MCA infarct which require 3+ hours per day of interdisciplinary therapy in a comprehensive inpatient rehab setting. Physiatrist is providing close team supervision and 24 hour management of active medical problems listed below. Physiatrist and rehab team continue to  assess barriers to discharge/monitor patient progress toward functional and medical goals.  Function:  Bathing Bathing position   Position: Wheelchair/chair at sink  Bathing parts Body parts bathed by patient: Right arm, Chest, Abdomen, Right  upper leg, Left upper leg, Right lower leg, Left lower leg Body parts bathed by helper: Left arm, Buttocks, Front perineal area, Back  Bathing assist Assist Level: Touching or steadying assistance(Pt > 75%)      Upper Body Dressing/Undressing Upper body dressing   What is the patient wearing?: Pull over shirt/dress     Pull over shirt/dress - Perfomed by patient: Thread/unthread left sleeve, Put head through opening, Pull shirt over trunk Pull over shirt/dress - Perfomed by helper: Thread/unthread right sleeve        Upper body assist Assist Level: Touching or steadying assistance(Pt > 75%)      Lower Body Dressing/Undressing Lower body dressing   What is the patient wearing?: Pants, Socks, Shoes     Pants- Performed by patient: Thread/unthread right pants leg, Thread/unthread left pants leg Pants- Performed by helper: Pull pants up/down   Non-skid slipper socks- Performed by helper: Don/doff right sock, Don/doff left sock   Socks - Performed by helper: Don/doff left sock, Don/doff right sock Shoes - Performed by patient: Don/doff left shoe Shoes - Performed by helper: Don/doff right shoe, Don/doff left shoe          Lower body assist Assist for lower body dressing: Touching or steadying assistance (Pt > 75%)      Toileting Toileting Toileting activity did not occur: No continent bowel/bladder event   Toileting steps completed by helper: Adjust clothing prior to toileting, Performs perineal hygiene, Adjust clothing after toileting    Toileting assist Assist level: Two helpers   Transfers Chair/bed transfer   Chair/bed transfer method: Squat pivot Chair/bed transfer assist level: Moderate assist (Pt 50 - 74%/lift or lower) Chair/bed transfer assistive device: Armrests     Locomotion Ambulation Ambulation activity did not occur: Safety/medical concerns   Max distance: 30 Assist level: Moderate assist (Pt 50 - 74%)   Wheelchair   Type: Manual Max wheelchair  distance: 150 Assist Level: Supervision or verbal cues  Cognition Comprehension Comprehension assist level: Understands basic 75 - 89% of the time/ requires cueing 10 - 24% of the time  Expression Expression assist level: Expresses basic 50 - 74% of the time/requires cueing 25 - 49% of the time. Needs to repeat parts of sentences.  Social Interaction Social Interaction assist level: Interacts appropriately 50 - 74% of the time - May be physically or verbally inappropriate.  Problem Solving Problem solving assist level: Solves basic 25 - 49% of the time - needs direction more than half the time to initiate, plan or complete simple activities  Memory Memory assist level: Recognizes or recalls 50 - 74% of the time/requires cueing 25 - 49% of the time    Medical Problem List and Plan: 1.  Hemiparesis, aphasia, dysphagia secondary to Left MCA infarct.   Continue CIR   WHO ordered 2.  DVT Prophylaxis/Anticoagulation: Pharmaceutical: Other (comment)--Eliquis 3. Pain Management: Tylenol prn  4. Mood: LCSW to follow for evaluation and support.  5. Neuropsych: This patient is not fully capable of making decisions on her own behalf. 6. Skin/Wound Care: Pressure relief measures.Maintain adequate hydration/nutritional status.  7. Fluids/Electrolytes/Nutrition: Adjust tube feeds to maintain adequate nutritional status. Consulted dietitian for input and adjustment.    BMP within acceptable range on 12/24 8. RUL/RLL aspiration PNA   Scheduled  Robitussin-DM to help with cough and secretions    Repeat chest x-ray reviewed, showing persistent right upper lobe disease, but resolution of right lower lobe infiltrate. Per Pulm, pro calcitonin ordered, reviewed, WNL, antibiotics DC'd. No need for further imaging at this time. Follow-up CT in 3 months   Repeat chest x-ray reviewed, showing stable abnormality.   Continue to monitor 9. ?CKD stage III: Monitor renal status with routine checks. Water flushes via PEG.     Creatinine 0.67 on 12/24   Appears to be controlled with adequate hydration 10 Recent GIB: Back on Eliquis.    Hemoglobin 10.9 on 12/24   Continue to monitor 11. Seizure disorder: stable/controlled on Keppra. 12. A Fib: Monitor HR bid. Continue metoprolol and decrease ASA to 81 mg/day.    Heart rate controlled on 12/22 13. Thrombocytosis: Resolved   Likely reactive.    Will continue to monitor.  14. Dysphagia: NPO (holding bite trials with ST), tolerating tube feeds 15. Protein calorie malnutrition: Chronic with baseline albumin around 2.5 in the past 6 months.    Pre-albumin 16.9 on 12/13.    Continue to monitor 16. COPD (per X ray)/RUL bronchogenic lung CA: completed XRT 06/2017 17. Hypokalemia: Resolved   Potassium 4.5 on 12/24   Continue to monitor 18. Leukocytosis: Resolved  Cont to monitor 19. Hyperglycemia   Likely secondary to tube feeds   Relatively controlled on 12/23 20. Urinary frequency  Home Ditropan restarted on 12/22 21. Confusion per her daughter   UA unremarkable, urine culture remains pending   No increasing confusion noted per my exam or per therapy  LOS (Days) 12 A FACE TO FACE EVALUATION WAS PERFORMED  Ankit Lorie Phenix 10/10/2017 9:04 AM

## 2017-10-10 NOTE — Progress Notes (Signed)
Orthopedic Tech Progress Note Patient Details:  Kayla Kent 08/23/1932 [id number]  Patient ID: Kayla Kent, female   DOB: 05-Sep-1932, 83 y.o.   MRN: [id number]   Kayla Kent 10/10/2017, 12:00 PMCalled [facility] no answer.

## 2017-10-10 NOTE — Progress Notes (Signed)
Occupational Therapy Session Note  Patient Details  Name: Kayla Kent MRN: [id number] Date of Birth: 1931/12/03  Today's Date: 10/10/2017 OT Individual Time: 0800-0902 OT Individual Time Calculation (min): 62 min    Short Term Goals: Week 2:  OT Short Term Goal 1 (Week 2): Pt will perform LB dressing with mod assist for gripper socks and pull up pants.   OT Short Term Goal 2 (Week 2): Pt will complete LB bathing sit to stand with min assist 2 consecutive sessions.  OT Short Term Goal 3 (Week 2): Pt will complete toileting with min assist sit to stand.   OT Short Term Goal 4 (Week 2): Pt will donn pullover shirt with supervision and mod instructional cueing.  Skilled Therapeutic Interventions/Progress Updates:    Pt completed transfer from supine to sit EOB with mod assist to start session.  Increased lean and LOB to the right side noted.  Max assist for stand pivot to the wheelchair on the left side to begin, secondary to stronger pushing to the right.  Mod instructional cueing for sequencing bathing sit to stand at the sink.  Max instructional cueing needed for use of the mirror for feedback secondary to lean/pushing to the right in sitting.  Still demonstrates decreased ability to reach the LLE for bathing and for dressing tasks.  Attempted use of the reacher to help her thread the pants over the LLE, but pt too apraxic to benefit with this attempt.  Min assist with mod demonstrational cueing for pullover shirt.  Pt perceptually demonstrates difficulty positioning the RUE into the correct arm hole to begin dressing.  Once this is positioned, she can complete all other parts of donning the pullover shirt with only min assist.  She needed mod assist for all standing this session to wash her peri area as well as pulling pants over her hips.  Max assist for donning socks and shoes with elastic laces.  Pt with increased motor planning difficulties noted when attempting to brush her teeth as well.   She needed mod assist for directing the toothbrush.  Max assist hand over hand for RUE functional use to brush her hair.  Finished session with pt in the wheelchair with safety belt in place.    Therapy Documentation Precautions:  Precautions Precautions: Fall Precaution Comments: R hemiplegia, expressive aphasis,  Restrictions Weight Bearing Restrictions: No  Pain: Pain Assessment Pain Assessment: No/denies pain ADL: See Function Navigator for Current Functional Status.   Therapy/Group: Individual Therapy  Kaeleb Emond OTR/L 10/10/2017, 12:06 PM

## 2017-10-10 NOTE — Progress Notes (Signed)
Speech Language Pathology Daily Session Note  Patient Details  Name: Kayla Kent MRN: [id number] Date of Birth: 1931-11-06  Today's Date: 10/10/2017 SLP Individual Time: 1130-1200 SLP Individual Time Calculation (min): 30 min  Short Term Goals: Week 2: SLP Short Term Goal 1 (Week 2): Pt will name familiar objects with min assist multimodal cues to recognize and correct verbal errors.   SLP Short Term Goal 2 (Week 2): Pt will utilize a basic, text based augmented communication system via pointing to convey immediate needs and wants in ~50% of opportunities with max assist multimodal cues SLP Short Term Goal 3 (Week 2): Pt will slow rate and increase vocal intensity to achieve intelligibility at the word level for ~75% accuracy with mod assist multimodal cues.   SLP Short Term Goal 4 (Week 2): Pt will consume therapeutic trials of honey thick liquids and purees with minimal overt s/s of aspiration and min cues for use of swallowing precautions over 3 consecutive targeted sessions prior to repeat objective study.   SLP Short Term Goal 5 (Week 2): Pt will sustain her attention to basic, familiar tasks for 5 minute intervals with min verbal cues for redirection.    Skilled Therapeutic Interventions: Skilled treatment session focused on dysphagia and communication goals. SLP facilitated session by providing Total A to use alphabet board to communicate that she was "hot." Although pt was pleasant throughout session, she was extremely frustrated when trying to communicate her wants regarding hot. Pt able to sustain attention to locating items within magazine for ~ 15 minutes with Min A cues. SLP further facilitated session by providing oral care and stimulation with lemon glycerin swabs. Pt with coughing on attempt to swallow own swallow. Pt's voice often wet throughout session. Pt was left upright in bed, bed alarm on and all needs within reach. Continue per current plan of care.       Function:  Eating Eating Eating activity did not occur: Safety/medical concerns               Cognition Comprehension Comprehension assist level: Understands basic 50 - 74% of the time/ requires cueing 25 - 49% of the time  Expression Expression assistive device: Communication board Expression assist level: Expresses basic 50 - 74% of the time/requires cueing 25 - 49% of the time. Needs to repeat parts of sentences.  Social Interaction Social Interaction assist level: Interacts appropriately 75 - 89% of the time - Needs redirection for appropriate language or to initiate interaction.  Problem Solving Problem solving assist level: Solves basic 25 - 49% of the time - needs direction more than half the time to initiate, plan or complete simple activities  Memory Memory assist level: Recognizes or recalls 50 - 74% of the time/requires cueing 25 - 49% of the time    Pain    Therapy/Group: Individual Therapy  Olson Lucarelli 10/10/2017, 12:29 PM

## 2017-10-10 NOTE — Progress Notes (Signed)
Physical Therapy Session Note  Patient Details  Name: Kayla Kent MRN: [id number] Date of Birth: 11/20/1931  Today's Date: 10/10/2017 PT Individual Time: 4401-0272, 5366-4403 PT Individual Time Calculation (min): 72 min , 31 min     Skilled Therapeutic Interventions/Progress Updates:    Session 1: Pt seated in w/c upon PT arrival, agreeable to therapy tx and denies. Pt propelled w/c from room<>gym using B LEs and with supervision, occasional verbal cues to avoid obstacles. Pt performed sit<>stand from w/c requiring mod assist and max assist to maintain balance secondary to pushing and increased R lateral lean. Pt transferred from w/c<>mat with mod assist squat pivot transfer. Pt seated edge of mat worked on L lateral leaning to touch shoulder to therapists hand, using mirror for visual feedback. Pt performed sit<>stand from mat, mod assist and continues to demonstrate pushing to the R. Pt transferred back to w/c min assist, squat pivot. Pt worked on midline, standing balance and L lateral leans using the mat as a tactile cue on the L side and the mirror for feedback. Pt worked on keeping L hip touching the mat to prevent R lateral lean/pushing while reaching for bean bags on the L and putting them in a basket on the R, min to mod assist. Pt worked on Control and instrumentation engineer without UE support, maintaining midline with visual feedback of the mirror, able to keep balance 20-30 sec with min assist. Pt worked on Teacher, early years/pre with each LE while keeping L hip to mat to minimize pushing, x 2 trials. Pt ambulated x 65 ft without AD and mod assist, verbal cues for midline and to minimize pushing. Pt propelled w/c back to room and transferred back to bed mod A. Pt transferred sitting>supine Mod A and left supine with needs in reach.   Session 2:  Pt seated in w/c upon PT arrival, agreeable to therapy tx and denies pain. Pt transported to gym in w/c total assist for time management. Pt  ambulated x 122 ft with mod assist without AD, improved midline noted this session, verbal cues for increased step length. Pt worked on sit<>stands x 5 without UE support, emphasis on pushing through LEs and maintaining midline, using mirror for visual feedback and raised mat on L side for tactile feedback. Pt worked on standing balance without UE support while lateral weightshifting from side to side using mirror for feedback. Pt ambulated x 110 ft from gym>room with min assist, improved midline overall, decreased pushing, verbal cues for increased step length and R foot clearance. Pt left supine in bed with needs in reach at end of session.   Therapy Documentation Precautions:  Precautions Precautions: Fall Precaution Comments: R hemiplegia, expressive aphasis,  Restrictions Weight Bearing Restrictions: No   See Function Navigator for Current Functional Status.   Therapy/Group: Individual Therapy  Netta Corrigan, PT, DPT 10/10/2017, 10:27 AM

## 2017-10-11 LAB — GLUCOSE, CAPILLARY
GLUCOSE-CAPILLARY: 110 mg/dL — AB (ref 65–99)
GLUCOSE-CAPILLARY: 104 mg/dL — AB (ref 65–99)
Glucose-Capillary: 97 mg/dL (ref 65–99)
Glucose-Capillary: 88 mg/dL (ref 65–99)

## 2017-10-11 NOTE — Progress Notes (Signed)
[place] [facility]ICINE & REHABILITATION     PROGRESS NOTE  Subjective/Complaints:  Pt seen laying in bed this AM.  She states she did not sleep well overnight, but does not offer any more beyond that.   ROS: Appears to deny CP, SOB, N/V/D.  Objective: Vital Signs: Blood pressure (!) 113/51, pulse (!) 59, temperature 98 F (36.7 C), temperature source Axillary, resp. rate 18, height 5\' 7"  (1.702 m), weight 41.1 kg (90 lb 9.7 oz), SpO2 98 %. Dg Chest 2 View  Result Date: 10/09/2017 CLINICAL DATA:  83 year old female with confusion EXAM: CHEST  2 VIEW COMPARISON:  Prior chest x-ray 10/06/2017 ; chest CT 09/11/2017 and 08/23/2017 FINDINGS: Persistent patchy airspace opacity in the posterior right upper lobe in the region of the previously identified pulmonary nodule. Stable cardiomegaly and mediastinal contours. Aortic atherosclerosis again noted. No new airspace consolidation, or recurrent pleural effusion. Mild vascular congestion without overt edema. Chronic bronchitic changes and interstitial prominence are similar compared to prior. Stable biapical pleuroparenchymal scarring. No acute osseous abnormality. IMPRESSION: No significant interval change in the appearance of the chest compared to 10/06/2017. Persistent patchy airspace opacity in the posterior right upper lobe in the region of the previously identified pulmonary nodule. This may represent residual pneumonia, pleuroparenchymal scarring from the recent pneumonia, or progression of the patient's underlying pulmonary nodule/suspected bronchogenic carcinoma. Stable cardiomegaly and chronic pulmonary parenchymal disease. Electronically Signed   By: Jacqulynn Cadet M.D.   On: 10/09/2017 09:16   Recent Labs    10/10/17 0615  WBC 6.5  HGB 10.9*  HCT 34.9*  PLT 360   Recent Labs    10/10/17 0615  NA 135  K 4.5  CL 98*  GLUCOSE 123*  BUN 25*  CREATININE 0.67  CALCIUM 8.7*   CBG (last 3)  Recent Labs    10/10/17 2011  10/11/17 0001 10/11/17 0534  GLUCAP 107* 97 110*    Wt Readings from Last 3 Encounters:  10/11/17 41.1 kg (90 lb 9.7 oz)  09/28/17 46.4 kg (102 lb 4.7 oz)  09/19/17 49.8 kg (109 lb 12.6 oz)    Physical Exam:  BP (!) 113/51   Pulse (!) 59   Temp 98 F (36.7 C) (Axillary)   Resp 18   Ht 5\' 7"  (1.702 m)   Wt 41.1 kg (90 lb 9.7 oz)   SpO2 98%   BMI 14.19 kg/m  Constitutional: She appears well-developed. She appears cachectic. NAD.  HENT: Normocephalic and atraumatic.  Eyes: No discharge. Dysconjugate gaze with right eye in lateral field (stable).  Cardiovascular: Irregularly irregular without JVD. Respiratory: Upper airway sounds. Unlabored. GI: Bowel sounds are normal. She exhibits no distension. +PEG. Musculoskeletal: She exhibits no edema or tenderness in extremities.  Neurological: She is alert. Right facial droop, improving, with right inattention.  Severe dysarthria with limited verbal output, ?expressive aphasia.  Able  to follow simple one step motor commands.  Motor: RUE: 4/5 shoulder abduction, 4-/5 elbow flex/ext, 1/5 wrist extension, 2/5 hand grip (stable). RLE: 4+/5 proximal to distal (stable) LUE/LLE: 5/5 proximal to distal  Skin: Skin is warm and dry. She is not diaphoretic.  Psychiatric: She is slowed  Assessment/Plan: 1. Functional deficits secondary to left MCA infarct which require 3+ hours per day of interdisciplinary therapy in a comprehensive inpatient rehab setting. Physiatrist is providing close team supervision and 24 hour management of active medical problems listed below. Physiatrist and rehab team continue to assess barriers to discharge/monitor patient progress toward functional and medical  goals.  Function:  Bathing Bathing position   Position: Wheelchair/chair at sink  Bathing parts Body parts bathed by patient: Right arm, Chest, Abdomen, Right upper leg, Left upper leg, Right lower leg Body parts bathed by helper: Left arm, Buttocks,  Front perineal area, Back, Left lower leg  Bathing assist Assist Level: Touching or steadying assistance(Pt > 75%)      Upper Body Dressing/Undressing Upper body dressing   What is the patient wearing?: Pull over shirt/dress     Pull over shirt/dress - Perfomed by patient: Thread/unthread left sleeve, Put head through opening, Pull shirt over trunk Pull over shirt/dress - Perfomed by helper: Thread/unthread right sleeve        Upper body assist Assist Level: Touching or steadying assistance(Pt > 75%)      Lower Body Dressing/Undressing Lower body dressing   What is the patient wearing?: Pants, Socks, Shoes     Pants- Performed by patient: Thread/unthread right pants leg Pants- Performed by helper: Pull pants up/down, Thread/unthread left pants leg   Non-skid slipper socks- Performed by helper: Don/doff right sock, Don/doff left sock   Socks - Performed by helper: Don/doff left sock, Don/doff right sock Shoes - Performed by patient: Don/doff left shoe Shoes - Performed by helper: Don/doff right shoe, Don/doff left shoe, Fasten right, Fasten left          Lower body assist Assist for lower body dressing: Touching or steadying assistance (Pt > 75%)      Toileting Toileting Toileting activity did not occur: No continent bowel/bladder event   Toileting steps completed by helper: Adjust clothing prior to toileting, Performs perineal hygiene, Adjust clothing after toileting    Toileting assist Assist level: Two helpers   Transfers Chair/bed transfer   Chair/bed transfer method: Stand pivot Chair/bed transfer assist level: Moderate assist (Pt 50 - 74%/lift or lower) Chair/bed transfer assistive device: Armrests     Locomotion Ambulation Ambulation activity did not occur: Safety/medical concerns   Max distance: 65 ft Assist level: Moderate assist (Pt 50 - 74%)   Wheelchair   Type: Manual Max wheelchair distance: 150 Assist Level: Supervision or verbal cues   Cognition Comprehension Comprehension assist level: Understands basic 50 - 74% of the time/ requires cueing 25 - 49% of the time  Expression Expression assist level: Expresses basic 50 - 74% of the time/requires cueing 25 - 49% of the time. Needs to repeat parts of sentences.  Social Interaction Social Interaction assist level: Interacts appropriately 75 - 89% of the time - Needs redirection for appropriate language or to initiate interaction.  Problem Solving Problem solving assist level: Solves basic 25 - 49% of the time - needs direction more than half the time to initiate, plan or complete simple activities  Memory Memory assist level: Recognizes or recalls 50 - 74% of the time/requires cueing 25 - 49% of the time    Medical Problem List and Plan: 1.  Hemiparesis, aphasia, dysphagia secondary to Left MCA infarct.   Continue CIR   WHO ordered, not in place this AMA 2.  DVT Prophylaxis/Anticoagulation: Pharmaceutical: Other (comment)--Eliquis 3. Pain Management: Tylenol prn  4. Mood: LCSW to follow for evaluation and support.  5. Neuropsych: This patient is not fully capable of making decisions on her own behalf. 6. Skin/Wound Care: Pressure relief measures.Maintain adequate hydration/nutritional status.  7. Fluids/Electrolytes/Nutrition: Adjust tube feeds to maintain adequate nutritional status. Consulted dietitian for input and adjustment.    BMP within acceptable range on 12/24 8. RUL/RLL aspiration PNA  Scheduled Robitussin-DM to help with cough and secretions    Repeat chest x-ray reviewed, showing persistent right upper lobe disease, but resolution of right lower lobe infiltrate. Per Pulm, pro calcitonin ordered, reviewed, WNL, antibiotics DC'd. No need for further imaging at this time. Follow-up CT in 3 months   Repeat chest x-ray reviewed, showing stable abnormality.   Continue to monitor 9. ?CKD stage III: Monitor renal status with routine checks. Water flushes via PEG.     Creatinine 0.67 on 12/24   Appears to be controlled with adequate hydration 10 Recent GIB: Back on Eliquis.    Hemoglobin 10.9 on 12/24   Continue to monitor 11. Seizure disorder: stable/controlled on Keppra. 12. A Fib: Monitor HR bid. Continue metoprolol and decrease ASA to 81 mg/day.    Heart rate controlled on 12/22 13. Thrombocytosis: Resolved   Likely reactive.    Will continue to monitor.  14. Dysphagia: NPO (holding bite trials with ST), tolerating tube feeds 15. Protein calorie malnutrition: Chronic with baseline albumin around 2.5 in the past 6 months.    Pre-albumin 16.9 on 12/13.    Continue to monitor 16. COPD (per X ray)/RUL bronchogenic lung CA: completed XRT 06/2017 17. Hypokalemia: Resolved   Potassium 4.5 on 12/24   Continue to monitor 18. Leukocytosis: Resolved  Cont to monitor 19. Hyperglycemia   Likely secondary to tube feeds   Relatively controlled on 12/24 20. Urinary frequency  Home Ditropan restarted on 12/22 21. Confusion per her daughter: Resolved   UA unremarkable, urine culture remains pending   No increasing confusion noted per my exam or per therapy  LOS (Days) 13 A FACE TO FACE EVALUATION WAS PERFORMED  Regan Llorente Lorie Phenix 10/11/2017 7:43 AM

## 2017-10-11 NOTE — Progress Notes (Signed)
Gave by nurse

## 2017-10-11 NOTE — Progress Notes (Signed)
Orthopedic Tech Progress Note Patient Details:  LACHRISHA ZIEBARTH 28-Dec-1931 [id number]  Patient ID: [name], female   DOB: Sep 14, 1932, 83 y.o.   MRN: [id number]   [place] Pink 10/11/2017, 8:17 AMCalled [facility] for right resting hand splint with cockup brace.

## 2017-10-12 ENCOUNTER — Inpatient Hospital Stay (HOSPITAL_COMMUNITY): Payer: Self-pay | Admitting: Occupational Therapy

## 2017-10-12 ENCOUNTER — Inpatient Hospital Stay (HOSPITAL_COMMUNITY): Payer: Self-pay

## 2017-10-12 ENCOUNTER — Inpatient Hospital Stay (HOSPITAL_COMMUNITY): Payer: Self-pay | Admitting: Speech Pathology

## 2017-10-12 LAB — GLUCOSE, CAPILLARY
GLUCOSE-CAPILLARY: 123 mg/dL — AB (ref 65–99)
GLUCOSE-CAPILLARY: 128 mg/dL — AB (ref 65–99)
GLUCOSE-CAPILLARY: 86 mg/dL (ref 65–99)
GLUCOSE-CAPILLARY: 96 mg/dL (ref 65–99)
Glucose-Capillary: 114 mg/dL — ABNORMAL HIGH (ref 65–99)
Glucose-Capillary: 66 mg/dL (ref 65–99)
Glucose-Capillary: 74 mg/dL (ref 65–99)
Glucose-Capillary: 89 mg/dL (ref 65–99)

## 2017-10-12 MED ORDER — COLLAGENASE 250 UNIT/GM EX OINT
TOPICAL_OINTMENT | Freq: Every day | CUTANEOUS | Status: DC
  Administered 2017-10-12 – 2017-10-24 (×13): via TOPICAL
  Filled 2017-10-12: qty 30

## 2017-10-12 NOTE — Progress Notes (Signed)
Physical Therapy Session Note  Patient Details  Name: Kayla Kent MRN: [id number] Date of Birth: 1932/06/25  Today's Date: 10/12/2017 PT Individual Time: 1000-1055 PT Individual Time Calculation (min): 55 min   Short Term Goals: Week 2:  PT Short Term Goal 1 (Week 2): Pt will maintain midline in static standing 75% of the time PT Short Term Goal 2 (Week 2): Pt will consistently transfer with min assist PT Short Term Goal 3 (Week 2): Pt will maintain midline during dynamic standing balance activities 50% of the time  Skilled Therapeutic Interventions/Progress Updates:    Pt seated in w/c upon PT arrival, agreeable to therapy tx and denies pain. Pt propelled w/c from room>gym x 184ft with supervision using B LEs. Session focused on midline orientation during static and dynamic balance. Pt performed sit<>stands x 5 with emphasis on symmetric LE weightbearing and maintaining midline, using mirror for visual feedback. Pt worked on keeping L hip touching the mat to prevent R lateral lean/pushing while reaching for horse shoes on the L and throwing them, min assist. Pt worked on Control and instrumentation engineer without UE support, maintaining midline with visual feedback of the mirror, able to keep balance 1-2 minutes with CGA-min assist. Pt ambulated x 65 ft without AD, min assist when walking in a straight path, mod assist with turns. Pt propelled w/c back to room and transferred to bed squat pivot mod assist. Pt left supine in bed with needs in reach.     Therapy Documentation Precautions:  Precautions Precautions: Fall Precaution Comments: R hemiplegia, expressive aphasis,  Restrictions Weight Bearing Restrictions: No   See Function Navigator for Current Functional Status.   Therapy/Group: Individual Therapy  Netta Corrigan , PT, DPT 10/12/2017, 11:00 AM

## 2017-10-12 NOTE — Progress Notes (Signed)
Social Work   Marchia Diguglielmo, Eliezer Champagne  Social Worker  Physical Medicine and Rehabilitation  Patient Care Conference  Signed  Date of Service:  10/12/2017  2:05 PM          Signed          [] Hide copied text  [] Hover for details   Inpatient RehabilitationTeam Conference and Plan of Care Update Date: 10/12/2017   Time: 11:20 AM      Patient Name: Kayla Kent      Medical Record Number: [id number]  Date of Birth: 05/07/1932 Sex: Female         Room/Bed: 4M03C/4M03C-01 Payor Info: Payor: MEDICARE / Plan: MEDICARE PART A AND B / Product Type: *No Product type* /     Admitting Diagnosis: L CVA  Admit Date/Time:  09/28/2017  3:20 PM Admission Comments: No comment available    Primary Diagnosis:  Acute ischemic left MCA stroke ([place]) Principal Problem: Acute ischemic left MCA stroke [facility])       Patient Active Problem List    Diagnosis Date Noted  . Confusion    . Abnormal chest x-ray    . Hyperglycemia    . Atrial fibrillation ([place])    . Acute blood loss anemia    . Dysarthria, post-stroke    . PNA (pneumonia)    . Moderate protein-calorie malnutrition ([place])    . Dysphagia, post-stroke    . Aspiration pneumonia of right lower lobe ([place])    . Stage 3 chronic kidney disease ([place])    . Thrombocytosis ([place])    . PAF (paroxysmal atrial fibrillation) ([place])    . Severe protein-calorie malnutrition ([place])    . Hypokalemia    . Acute respiratory failure with hypoxia ([place]) 09/19/2017  . Dysphagia 09/19/2017  . History of GI bleed    . Acute ischemic left MCA stroke ([place]) 09/16/2017  . Cerebral thrombosis with cerebral infarction 09/13/2017  . Fever    . Aspiration pneumonia due to gastric secretions ([place])    . Leukocytosis    . Acute hypoxemic respiratory failure ([place]) 09/11/2017  . Melena    . Pressure injury of skin 09/09/2017  . Heme positive stool    . Acute upper GI bleed 09/06/2017  . HTN (hypertension) 09/06/2017  . Seizure disorder ([place]) 09/06/2017  .  Chronic atrial fibrillation ([place]) 09/06/2017  . Thyroid nodule 09/06/2017  . CKD (chronic kidney disease), stage III ([place]) 09/06/2017  . Dyslipidemia 09/06/2017  . COPD (chronic obstructive pulmonary disease) ([place]) 09/06/2017  . CKD (chronic kidney disease) stage 3, GFR 30-59 ml/min (HCC)    . Malignant neoplasm of right upper lobe of lung ([place]) 06/21/2017  . Pulmonary emphysema ([place]) 04/26/2017  . Pleural effusion 04/26/2017  . Near syncope    . Gastroesophageal reflux disease    . Chest pain 04/14/2017  . Lung nodule < 6cm on CT 04/12/2017  . Acute renal failure ([place])    . Sepsis ([place]) 04/06/2017  . Atrial fibrillation with RVR ([place]) 04/06/2017  . Urinary frequency 03/03/2017  . Hemorrhoids 03/03/2017  . Insomnia 03/03/2017  . Postoperative anemia due to acute blood loss 11/24/2016  . Hematoma left thigh 11/24/2016  . Closed femur fracture ([place]) 11/20/2016  . Pedal edema 10/10/2016  . Persistent atrial fibrillation ([place]) 12/11/2014  . S/P ablation of atrial fibrillation 12/11/2014  . Orthostatic hypotension 12/11/2014  . Paroxysmal SVT (supraventricular tachycardia) ([place]) 12/11/2014  . Atherosclerotic peripheral vascular disease ([place]) 12/11/2014  . Paroxysmal atrial fibrillation (HCC)  11/05/2014  . Orthostasis 10/03/2014  . Non-compliant behavior 10/01/2014  . TIA (transient ischemic attack) 09/23/2014  . Numbness of left hand    . Acute on chronic diastolic heart failure ([place]) 09/21/2014  . Fracture of femoral neck, right ([place]) 08/28/2014  . Distal radius fracture, right 08/28/2014  . Hip fracture requiring operative repair ([place]) 08/28/2014  . Paroxysmal spells 03/28/2014  . Seizures ([place]) 03/28/2013  . Syncope and collapse 07/27/2012  . Routine health maintenance 12/12/2011  . PERIPHERAL CIRCULATORY DISORDER 04/10/2010  . Chronic diastolic CHF (congestive heart failure) ([place]) 07/17/2009  . Depression 07/04/2009  . Anxiety 01/19/2008  . HLD (hyperlipidemia) 11/14/2007    . Essential hypertension 11/14/2007  . PAROXYSMAL ATRIAL TACHYCARDIA 11/14/2007  . UTERINE PROLAPSE 11/14/2007      Expected Discharge Date: Expected Discharge Date: 10/22/17   Team Members Present: Physician leading conference: Dr. Delice Lesch Social Worker Present: Ovidio Kin, LCSW Nurse Present: Brita Romp, RN PT Present: Michaelene Song, PT OT Present: Willeen Cass, OT SLP Present: Charolett Bumpers, SLP PPS Coordinator present : Daiva Nakayama, RN, CRRN       Current Status/Progress Goal Weekly Team Focus  Medical     Hemiparesis, aphasia, dysphagia secondary to Left MCA infarct  Improve mobility, safety, dysphagia  See above   Bowel/Bladder     continent and incontinent of b/b, LBM 12/25, incontinent.  pt feels urge to void almost constant and requests to toilet q 15-30 minutes at times, q 4-6 hours pvr's have been  < 350, 12/26 PVR was 400  manage b/b with mod assist, maintain reg bowel pattern.  timed toileting during waking hours q 2-3 hours.   Swallow/Nutrition/ Hydration     continues to have decreased management of secretions, PO trials limited for pt's safety   Min A   management of secretions, trials of honey thick liquids and/or puree as appropriate for pt's safety    ADL's     Min assist for UB bathing with min to mod for UB dressing.  Mod assist for LB bathing with max assist for dressing.  Mod assist still for stand pivot trnasrers.  Still with pusher tendencies to the right, which were worse on am of 12/24 session.  Still demonstrates right neglect, and motor planning defiicits as well.  Good movement at the right shoulder and elbow but limited in the hand and wrist.  Increased flexor tone in the digits.   min assist overall  seflcare retraining, balance retraining, transfer training, neuromuscular re-education, pt/family education, therapeutic exercise.     Mobility     min-mod bed mobility, min-mod sit to stands and transfers, mod assist gait up to 65 ft  without AD  min assist  postural control, midline orientation, family ed, R NMR, gait, transfers   Communication     Mod-max assist for mulitmodal communication   mod A  continue to address communication board and verbal expression    Safety/Cognition/ Behavioral Observations   mod-max assist  Mod assist  Continue to address verbal awareness, sustained attention, basic problem solving    Pain     no c/o pain  pain <2  assess pain q shift and prn   Skin     heels boggy, wound to coccyx is now yellow slough, unstageable, woc consult ordered, gerrhardt cream in use with foam dressing  resolve wound to coccyx with mod assist, no new skin breakdown  prevalon boots for heels, assess skin q shift and prn, question if pt should  have air mattress     *See Care Plan and progress notes for long and short-term goals.      Barriers to Discharge   Current Status/Progress Possible Resolutions Date Resolved   Physician     Decreased caregiver support;Medical stability;Nutrition means;Incontinence     See above  Therapies, follow labs, cont TFs      Nursing                 PT  Inaccessible home environment;Decreased caregiver support;Lack of/limited family support;Home environment access/layout  lived alone prior to admission              OT                 SLP            SW              Discharge Planning/Teaching Needs:  Family deciding if can provide the care pt will need at discharge, wants longer length of stay. Family Conf Friday 10:00      Team Discussion:  Progressing in her therapies, not pushing as much in therapies. RN timed toileting for continence. Yes/no imprioving along with the communication board.Stood for 2 12 minutes today unassisted. Family Conf Friday with family. Team felt would benefit form discharge extension to 1/5  Revisions to Treatment Plan:  DC 1/5    Continued Need for Acute Rehabilitation Level of Care: The patient requires daily medical management by a physician  with specialized training in physical medicine and rehabilitation for the following conditions: Daily direction of a multidisciplinary physical rehabilitation program to ensure safe treatment while eliciting the highest outcome that is of practical value to the patient.: Yes Daily medical management of patient stability for increased activity during participation in an intensive rehabilitation regime.: Yes Daily analysis of laboratory values and/or radiology reports with any subsequent need for medication adjustment of medical intervention for : Neurological problems;Nutritional problems   Caitlan Chauca, Gardiner Rhyme 10/12/2017, 2:05 PM                  Kenyatta Keidel, Gardiner Rhyme, LCSW  Social Worker  Physical Medicine and Rehabilitation  Patient Care Conference  Signed  Date of Service:  10/05/2017  1:36 PM          Signed          [] Hide copied text  [] Hover for details   Inpatient RehabilitationTeam Conference and Plan of Care Update Date: 10/05/2017   Time: 10:30 AM      Patient Name: Kayla Kent      Medical Record Number: [id number]  Date of Birth: [birth year] Sex: Female         Room/Bed: 4M03C/4M03C-01 Payor Info: Payor: MEDICARE / Plan: MEDICARE PART A AND B / Product Type: *No Product type* /     Admitting Diagnosis: L CVA  Admit Date/Time:  09/28/2017  3:20 PM Admission Comments: No comment available    Primary Diagnosis:  Acute ischemic left MCA stroke ([place]) Principal Problem: Acute ischemic left MCA stroke [facility])       Patient Active Problem List    Diagnosis Date Noted  . Atrial fibrillation ([place])    . Acute blood loss anemia    . Dysarthria, post-stroke    . PNA (pneumonia)    . Moderate protein-calorie malnutrition ([place])    . Dysphagia, post-stroke    . Aspiration pneumonia of right lower lobe ([place])    . Stage 3 chronic kidney  disease ([place])    . Thrombocytosis ([place])    . PAF (paroxysmal atrial fibrillation) ([place])    . Severe protein-calorie malnutrition  ([place])    . Hypokalemia    . Acute respiratory failure with hypoxia ([place]) 09/19/2017  . Dysphagia 09/19/2017  . History of GI bleed    . Acute ischemic left MCA stroke ([place]) 09/16/2017  . Cerebral thrombosis with cerebral infarction 09/13/2017  . Fever    . Aspiration pneumonia due to gastric secretions ([place])    . Leukocytosis    . Acute hypoxemic respiratory failure ([place]) 09/11/2017  . Melena    . Pressure injury of skin 09/09/2017  . Heme positive stool    . Acute upper GI bleed 09/06/2017  . HTN (hypertension) 09/06/2017  . Seizure disorder ([place]) 09/06/2017  . Chronic atrial fibrillation ([place]) 09/06/2017  . Thyroid nodule 09/06/2017  . CKD (chronic kidney disease), stage III ([place]) 09/06/2017  . Dyslipidemia 09/06/2017  . COPD (chronic obstructive pulmonary disease) ([place]) 09/06/2017  . CKD (chronic kidney disease) stage 3, GFR 30-59 ml/min (HCC)    . Malignant neoplasm of right upper lobe of lung ([place]) 06/21/2017  . Pulmonary emphysema ([place]) 04/26/2017  . Pleural effusion 04/26/2017  . Near syncope    . Gastroesophageal reflux disease    . Chest pain 04/14/2017  . Lung nodule < 6cm on CT 04/12/2017  . Acute renal failure ([place])    . Sepsis ([place]) 04/06/2017  . Atrial fibrillation with RVR ([place]) 04/06/2017  . Urinary frequency 03/03/2017  . Hemorrhoids 03/03/2017  . Insomnia 03/03/2017  . Postoperative anemia due to acute blood loss 11/24/2016  . Hematoma left thigh 11/24/2016  . Closed femur fracture ([place]) 11/20/2016  . Pedal edema 10/10/2016  . Persistent atrial fibrillation ([place]) 12/11/2014  . S/P ablation of atrial fibrillation 12/11/2014  . Orthostatic hypotension 12/11/2014  . Paroxysmal SVT (supraventricular tachycardia) ([place]) 12/11/2014  . Atherosclerotic peripheral vascular disease ([place]) 12/11/2014  . Paroxysmal atrial fibrillation ([place]) 11/05/2014  . Orthostasis 10/03/2014  . Non-compliant behavior 10/01/2014  . TIA (transient ischemic attack) 09/23/2014  .  Numbness of left hand    . Acute on chronic diastolic heart failure ([place]) 09/21/2014  . Fracture of femoral neck, right ([place]) 08/28/2014  . Distal radius fracture, right 08/28/2014  . Hip fracture requiring operative repair ([place]) 08/28/2014  . Paroxysmal spells 03/28/2014  . Seizures ([place]) 03/28/2013  . Syncope and collapse 07/27/2012  . Routine health maintenance 12/12/2011  . PERIPHERAL CIRCULATORY DISORDER 04/10/2010  . Chronic diastolic CHF (congestive heart failure) ([place]) 07/17/2009  . Depression 07/04/2009  . Anxiety 01/19/2008  . HLD (hyperlipidemia) 11/14/2007  . Essential hypertension 11/14/2007  . PAROXYSMAL ATRIAL TACHYCARDIA 11/14/2007  . UTERINE PROLAPSE 11/14/2007      Expected Discharge Date: Expected Discharge Date: 10/17/17   Team Members Present: Physician leading conference: Dr. Delice Lesch Social Worker Present: Ovidio Kin, LCSW Nurse Present: Leonette Nutting, RN PT Present: Michaelene Song, PT OT Present: Clyda Greener, OT SLP Present: Charolett Bumpers, SLP PPS Coordinator present : Daiva Nakayama, RN, CRRN       Current Status/Progress Goal Weekly Team Focus  Medical     Hemiparesis, aphasia, dysphagia secondary to Left MCA infarct  Improve mobility, safety, dysphagia, ABLA, WBCs, PNA  See above   Bowel/Bladder     incontinent of bowel and bladder LBM 10-03-17  manage bowel and bladder with mod assistanc maintain regular bowel pattern `  time tolieting   Swallow/Nutrition/ Hydration  Total A trials of HTL, oral apraxia unable to execpt PO trials antieror spillage   MIn A  oral care, trials of HTl / puree    ADL's     min assist for UB bathing, mod assist for UB dressing, mod assist for LB bathing with max assist for dressing.  Mod assist for stand pivot transfers.  Still with pusher tendencies to the right in standing.  Brunnstrum stage V in the right arm and elbow but stage I in the wrist and digits.  Max hand over hand to integrate into function.  Motor  planning deficits still present as well.  min assist overall  selfcare retraining, balance retraining, transfer training, neuromuscular re-education, pt/family education, therapeutic exercise   Mobility     min assist for bed mobility, min-mod assist squat pivot transfers, mod assist gait up to 60 ft without AD, mod assist for standing balance secondary to R posterior lean.   min assist  postural control, balance, gait, transfers, R NMR, family ed   Communication     Max A to use communication board, Min A id and yes/no questions   Mod A  communication board,    Safety/Cognition/ Behavioral Observations   Mod-Max A, naming, problem solving, id deficits   Mod-Min A  Id deficits, sustained attention, basic probelm solving   Pain     no c/o pain  no c/o pain  Assess pain q shift and prn    Skin     heels boggy, stage 2 bottom, reddness to bottom gerhardtt cream in use,   no new skin issue, resolving stage 2   Assess skin q shift and prn apply order cream to bottom , allevyn in use     *See Care Plan and progress notes for long and short-term goals.      Barriers to Discharge   Current Status/Progress Possible Resolutions Date Resolved   Physician     Decreased caregiver support;Medical stability;Nutrition means;Incontinence     See above  Therapies, follow labs, cont abx, plan for CXR tomorrow      Nursing                 PT                    OT                 SLP            SW              Discharge Planning/Teaching Needs:  Daughter and son very involved, daughter is local and hoeps to take pt home depends upon her level at discharge.      Team Discussion:  Goals min assist level. Trials of honey and puree with Speech. R-posterior lean and pushes to the right which throws off her balance. Motor planning deficits. R-UE improving. Antibiotics to finish tomorrow and will do chest x-ray and if needed consult pulm MD or chest CT. RN working on timed toileting  Revisions to  Treatment Plan:  DC 12/31    Continued Need for Acute Rehabilitation Level of Care: The patient requires daily medical management by a physician with specialized training in physical medicine and rehabilitation for the following conditions: Daily direction of a multidisciplinary physical rehabilitation program to ensure safe treatment while eliciting the highest outcome that is of practical value to the patient.: Yes Daily medical management of patient stability for increased activity during participation in  an intensive rehabilitation regime.: Yes Daily analysis of laboratory values and/or radiology reports with any subsequent need for medication adjustment of medical intervention for : Pulmonary problems;Neurological problems;Nutritional problems   Elease Hashimoto 10/05/2017, 1:37 PM                 Patient ID: LIMA CHILLEMI, female   DOB: 03/11/1932, 83 y.o.   MRN: [id number]

## 2017-10-12 NOTE — Patient Care Conference (Signed)
Inpatient RehabilitationTeam Conference and Plan of Care Update Date: 10/12/2017   Time: 11:20 AM    Patient Name: Kayla Kent      Medical Record Number: [id number]  Date of Birth: 1932-06-26 Sex: Female         Room/Bed: 4M03C/4M03C-01 Payor Info: Payor: MEDICARE / Plan: MEDICARE PART A AND B / Product Type: *No Product type* /    Admitting Diagnosis: L CVA  Admit Date/Time:  09/28/2017  3:20 PM Admission Comments: No comment available   Primary Diagnosis:  Acute ischemic left MCA stroke ([place]) Principal Problem: Acute ischemic left MCA stroke [facility])  Patient Active Problem List   Diagnosis Date Noted  . Confusion   . Abnormal chest x-ray   . Hyperglycemia   . Atrial fibrillation ([place])   . Acute blood loss anemia   . Dysarthria, post-stroke   . PNA (pneumonia)   . Moderate protein-calorie malnutrition ([place])   . Dysphagia, post-stroke   . Aspiration pneumonia of right lower lobe ([place])   . Stage 3 chronic kidney disease ([place])   . Thrombocytosis ([place])   . PAF (paroxysmal atrial fibrillation) ([place])   . Severe protein-calorie malnutrition ([place])   . Hypokalemia   . Acute respiratory failure with hypoxia ([place]) 09/19/2017  . Dysphagia 09/19/2017  . History of GI bleed   . Acute ischemic left MCA stroke ([place]) 09/16/2017  . Cerebral thrombosis with cerebral infarction 09/13/2017  . Fever   . Aspiration pneumonia due to gastric secretions ([place])   . Leukocytosis   . Acute hypoxemic respiratory failure ([place]) 09/11/2017  . Melena   . Pressure injury of skin 09/09/2017  . Heme positive stool   . Acute upper GI bleed 09/06/2017  . HTN (hypertension) 09/06/2017  . Seizure disorder ([place]) 09/06/2017  . Chronic atrial fibrillation ([place]) 09/06/2017  . Thyroid nodule 09/06/2017  . CKD (chronic kidney disease), stage III ([place]) 09/06/2017  . Dyslipidemia 09/06/2017  . COPD (chronic obstructive pulmonary disease) ([place]) 09/06/2017  . CKD (chronic kidney disease) stage 3, GFR 30-59  ml/min (HCC)   . Malignant neoplasm of right upper lobe of lung ([place]) 06/21/2017  . Pulmonary emphysema ([place]) 04/26/2017  . Pleural effusion 04/26/2017  . Near syncope   . Gastroesophageal reflux disease   . Chest pain 04/14/2017  . Lung nodule < 6cm on CT 04/12/2017  . Acute renal failure ([place])   . Sepsis ([place]) 04/06/2017  . Atrial fibrillation with RVR ([place]) 04/06/2017  . Urinary frequency 03/03/2017  . Hemorrhoids 03/03/2017  . Insomnia 03/03/2017  . Postoperative anemia due to acute blood loss 11/24/2016  . Hematoma left thigh 11/24/2016  . Closed femur fracture ([place]) 11/20/2016  . Pedal edema 10/10/2016  . Persistent atrial fibrillation ([place]) 12/11/2014  . S/P ablation of atrial fibrillation 12/11/2014  . Orthostatic hypotension 12/11/2014  . Paroxysmal SVT (supraventricular tachycardia) ([place]) 12/11/2014  . Atherosclerotic peripheral vascular disease ([place]) 12/11/2014  . Paroxysmal atrial fibrillation ([place]) 11/05/2014  . Orthostasis 10/03/2014  . Non-compliant behavior 10/01/2014  . TIA (transient ischemic attack) 09/23/2014  . Numbness of left hand   . Acute on chronic diastolic heart failure ([place]) 09/21/2014  . Fracture of femoral neck, right ([place]) 08/28/2014  . Distal radius fracture, right 08/28/2014  . Hip fracture requiring operative repair ([place]) 08/28/2014  . Paroxysmal spells 03/28/2014  . Seizures ([place]) 03/28/2013  . Syncope and collapse 07/27/2012  . Routine health maintenance 12/12/2011  . PERIPHERAL CIRCULATORY DISORDER 04/10/2010  . Chronic diastolic CHF (congestive heart  failure) ([place]) 07/17/2009  . Depression 07/04/2009  . Anxiety 01/19/2008  . HLD (hyperlipidemia) 11/14/2007  . Essential hypertension 11/14/2007  . PAROXYSMAL ATRIAL TACHYCARDIA 11/14/2007  . UTERINE PROLAPSE 11/14/2007    Expected Discharge Date: Expected Discharge Date: 10/22/17  Team Members Present: Physician leading conference: Dr. Delice Lesch Social Worker Present: Ovidio Kin, LCSW Nurse Present: Brita Romp, RN PT Present: Michaelene Song, PT OT Present: Willeen Cass, OT SLP Present: Charolett Bumpers, SLP PPS Coordinator present : Daiva Nakayama, RN, CRRN     Current Status/Progress Goal Weekly Team Focus  Medical   Hemiparesis, aphasia, dysphagia secondary to Left MCA infarct  Improve mobility, safety, dysphagia  See above   Bowel/Bladder   continent and incontinent of b/b, LBM 12/25, incontinent.  pt feels urge to void almost constant and requests to toilet q 15-30 minutes at times, q 4-6 hours pvr's have been  < 350, 12/26 PVR was 400  manage b/b with mod assist, maintain reg bowel pattern.  timed toileting during waking hours q 2-3 hours.   Swallow/Nutrition/ Hydration   continues to have decreased management of secretions, PO trials limited for pt's safety   Min A   management of secretions, trials of honey thick liquids and/or puree as appropriate for pt's safety    ADL's   Min assist for UB bathing with min to mod for UB dressing.  Mod assist for LB bathing with max assist for dressing.  Mod assist still for stand pivot trnasrers.  Still with pusher tendencies to the right, which were worse on am of 12/24 session.  Still demonstrates right neglect, and motor planning defiicits as well.  Good movement at the right shoulder and elbow but limited in the hand and wrist.  Increased flexor tone in the digits.   min assist overall  seflcare retraining, balance retraining, transfer training, neuromuscular re-education, pt/family education, therapeutic exercise.     Mobility   min-mod bed mobility, min-mod sit to stands and transfers, mod assist gait up to 65 ft without AD  min assist  postural control, midline orientation, family ed, R NMR, gait, transfers   Communication   Mod-max assist for mulitmodal communication   mod A  continue to address communication board and verbal expression    Safety/Cognition/ Behavioral Observations  mod-max assist  Mod  assist  Continue to address verbal awareness, sustained attention, basic problem solving    Pain   no c/o pain  pain <2  assess pain q shift and prn   Skin   heels boggy, wound to coccyx is now yellow slough, unstageable, woc consult ordered, gerrhardt cream in use with foam dressing  resolve wound to coccyx with mod assist, no new skin breakdown  prevalon boots for heels, assess skin q shift and prn, question if pt should have air mattress      *See Care Plan and progress notes for long and short-term goals.     Barriers to Discharge  Current Status/Progress Possible Resolutions Date Resolved   Physician    Decreased caregiver support;Medical stability;Nutrition means;Incontinence     See above  Therapies, follow labs, cont TFs      Nursing                  PT  Inaccessible home environment;Decreased caregiver support;Lack of/limited family support;Home environment access/layout  lived alone prior to admission              OT  SLP                SW                Discharge Planning/Teaching Needs:  Family deciding if can provide the care pt will need at discharge, wants longer length of stay. Family Conf Friday 10:00      Team Discussion:  Progressing in her therapies, not pushing as much in therapies. RN timed toileting for continence. Yes/no imprioving along with the communication board.Stood for 2 12 minutes today unassisted. Family Conf Friday with family. Team felt would benefit form discharge extension to 1/5  Revisions to Treatment Plan:  DC 1/5    Continued Need for Acute Rehabilitation Level of Care: The patient requires daily medical management by a physician with specialized training in physical medicine and rehabilitation for the following conditions: Daily direction of a multidisciplinary physical rehabilitation program to ensure safe treatment while eliciting the highest outcome that is of practical value to the patient.: Yes Daily medical  management of patient stability for increased activity during participation in an intensive rehabilitation regime.: Yes Daily analysis of laboratory values and/or radiology reports with any subsequent need for medication adjustment of medical intervention for : Neurological problems;Nutritional problems  Shannon Balthazar, Gardiner Rhyme 10/12/2017, 2:05 PM

## 2017-10-12 NOTE — Progress Notes (Signed)
[facility] PHYSICAL MEDICINE & REHABILITATION     PROGRESS NOTE  Subjective/Complaints:  Pt seen sitting up in bed this AM.  She slept well overnight.  She is quicker to respond and speech is more clear this AM.   ROS: Denies CP, SOB, N/V/D.  Objective: Vital Signs: Blood pressure (!) 120/49, pulse 80, temperature 97.8 F (36.6 C), temperature source Oral, resp. rate 18, height 5\' 7"  (1.702 m), weight 42.1 kg (92 lb 13 oz), SpO2 100 %. No results found. Recent Labs    10/10/17 0615  WBC 6.5  HGB 10.9*  HCT 34.9*  PLT 360   Recent Labs    10/10/17 0615  NA 135  K 4.5  CL 98*  GLUCOSE 123*  BUN 25*  CREATININE 0.67  CALCIUM 8.7*   CBG (last 3)  Recent Labs    10/11/17 2057 10/12/17 0019 10/12/17 0412  GLUCAP 104* 96 86    Wt Readings from Last 3 Encounters:  10/12/17 42.1 kg (92 lb 13 oz)  09/28/17 46.4 kg (102 lb 4.7 oz)  09/19/17 49.8 kg (109 lb 12.6 oz)    Physical Exam:  BP (!) 120/49 (BP Location: Right Arm)   Pulse 80   Temp 97.8 F (36.6 C) (Oral)   Resp 18   Ht 5\' 7"  (1.702 m)   Wt 42.1 kg (92 lb 13 oz)   SpO2 100%   BMI 14.54 kg/m  Constitutional: She appears well-developed. She appears cachectic. NAD.  HENT: Normocephalic and atraumatic.  Eyes: No discharge. Dysconjugate gaze with right eye in lateral field (stable).  Cardiovascular: Irregularly irregular without JVD. Respiratory: Upper airway sounds. unlabored. GI: Bowel sounds are normal. She exhibits no distension. +PEG. Musculoskeletal: She exhibits no edema or tenderness in extremities.  Neurological: She is alert. Right facial droop Severe dysarthria, improving, ?expressive aphasia.  Able  to follow simple one step motor commands.  Motor: RUE: 4/5 shoulder abduction, 4-/5 elbow flex/ext, 1/5 wrist extension, 2/5 hand grip (stable). RLE: 4+/5 proximal to distal (stable) LUE/LLE: 5/5 proximal to distal  Skin: Skin is warm and dry. She is not diaphoretic.  Psychiatric: She is  flat.  Assessment/Plan: 1. Functional deficits secondary to left MCA infarct which require 3+ hours per day of interdisciplinary therapy in a comprehensive inpatient rehab setting. Physiatrist is providing close team supervision and 24 hour management of active medical problems listed below. Physiatrist and rehab team continue to assess barriers to discharge/monitor patient progress toward functional and medical goals.  Function:  Bathing Bathing position   Position: Wheelchair/chair at sink  Bathing parts Body parts bathed by patient: Right arm, Chest, Abdomen, Right upper leg, Left upper leg, Right lower leg Body parts bathed by helper: Left arm, Buttocks, Front perineal area, Back, Left lower leg  Bathing assist Assist Level: Touching or steadying assistance(Pt > 75%)      Upper Body Dressing/Undressing Upper body dressing   What is the patient wearing?: Pull over shirt/dress     Pull over shirt/dress - Perfomed by patient: Thread/unthread left sleeve, Put head through opening, Pull shirt over trunk Pull over shirt/dress - Perfomed by helper: Thread/unthread right sleeve        Upper body assist Assist Level: Touching or steadying assistance(Pt > 75%)      Lower Body Dressing/Undressing Lower body dressing   What is the patient wearing?: Pants, Socks, Shoes     Pants- Performed by patient: Thread/unthread right pants leg Pants- Performed by helper: Pull pants up/down, Thread/unthread left pants  leg   Non-skid slipper socks- Performed by helper: Don/doff right sock, Don/doff left sock   Socks - Performed by helper: Don/doff left sock, Don/doff right sock Shoes - Performed by patient: Don/doff left shoe Shoes - Performed by helper: Don/doff right shoe, Don/doff left shoe, Fasten right, Fasten left          Lower body assist Assist for lower body dressing: Touching or steadying assistance (Pt > 75%)      Toileting Toileting Toileting activity did not occur: No  continent bowel/bladder event Toileting steps completed by patient: Adjust clothing prior to toileting Toileting steps completed by helper: Adjust clothing prior to toileting, Performs perineal hygiene, Adjust clothing after toileting    Toileting assist Assist level: Touching or steadying assistance (Pt.75%)   Transfers Chair/bed transfer   Chair/bed transfer method: Stand pivot Chair/bed transfer assist level: Moderate assist (Pt 50 - 74%/lift or lower) Chair/bed transfer assistive device: Armrests     Locomotion Ambulation Ambulation activity did not occur: Safety/medical concerns   Max distance: 65 ft Assist level: Moderate assist (Pt 50 - 74%)   Wheelchair   Type: Manual Max wheelchair distance: 150 Assist Level: Supervision or verbal cues  Cognition Comprehension Comprehension assist level: Understands basic 50 - 74% of the time/ requires cueing 25 - 49% of the time  Expression Expression assist level: Expresses basic 50 - 74% of the time/requires cueing 25 - 49% of the time. Needs to repeat parts of sentences.  Social Interaction Social Interaction assist level: Interacts appropriately 75 - 89% of the time - Needs redirection for appropriate language or to initiate interaction.  Problem Solving Problem solving assist level: Solves basic 25 - 49% of the time - needs direction more than half the time to initiate, plan or complete simple activities  Memory Memory assist level: Recognizes or recalls 50 - 74% of the time/requires cueing 25 - 49% of the time    Medical Problem List and Plan: 1.  Hemiparesis, aphasia, dysphagia secondary to Left MCA infarct.   Continue CIR   WHO ordered, not in place this AM   Team conference this AM 2.  DVT Prophylaxis/Anticoagulation: Pharmaceutical: Other (comment)--Eliquis 3. Pain Management: Tylenol prn  4. Mood: LCSW to follow for evaluation and support.  5. Neuropsych: This patient is not fully capable of making decisions on her own  behalf. 6. Skin/Wound Care: Pressure relief measures.Maintain adequate hydration/nutritional status.  7. Fluids/Electrolytes/Nutrition: Adjust tube feeds to maintain adequate nutritional status. Consulted dietitian for input and adjustment.    BMP within acceptable range on 12/24 8. RUL/RLL aspiration PNA   Scheduled Robitussin-DM to help with cough and secretions    Repeat chest x-ray reviewed, showing persistent right upper lobe disease, but resolution of right lower lobe infiltrate. Per Pulm, pro calcitonin ordered, reviewed, WNL, antibiotics DC'd. No need for further imaging at this time. Follow-up CT in 3 months   Repeat chest x-ray reviewed, showing stable abnormality.   Continue to monitor 9. ?CKD stage III: Monitor renal status with routine checks. Water flushes via PEG.    Creatinine 0.67 on 12/24   Appears to be controlled with adequate hydration 10 Recent GIB: Back on Eliquis.    Hemoglobin 10.9 on 12/24   Continue to monitor 11. Seizure disorder: stable/controlled on Keppra. 12. A Fib: Monitor HR bid. Continue metoprolol and decrease ASA to 81 mg/day.    Heart rate controlled on 12/26 13. Thrombocytosis: Resolved   Likely reactive.    Will continue to monitor.  14. Dysphagia: NPO (holding bite trials with ST), tolerating tube feeds 15. Protein calorie malnutrition: Chronic with baseline albumin around 2.5 in the past 6 months.    Pre-albumin 16.9 on 12/13.    Continue to monitor 16. COPD (per X ray)/RUL bronchogenic lung CA: completed XRT 06/2017 17. Hypokalemia: Resolved   Potassium 4.5 on 12/24   Continue to monitor 18. Leukocytosis: Resolved  Cont to monitor 19. Hyperglycemia   Likely secondary to tube feeds   Relatively controlled on 12/26 20. Urinary frequency  Home Ditropan restarted on 12/22 21. Confusion per her daughter: Resolved   UA unremarkable, urine culture remains pending   No increasing confusion noted per my exam or per therapy  LOS (Days) 14 A  FACE TO FACE EVALUATION WAS PERFORMED  Ankit Lorie Phenix 10/12/2017 8:19 AM

## 2017-10-12 NOTE — Progress Notes (Signed)
Speech Language Pathology Daily Session Note  Patient Details  Name: ISMERAI BIN MRN: [id number] Date of Birth: 1932/03/03  Today's Date: 10/12/2017 SLP Individual Time: 4098-1191 SLP Individual Time Calculation (min): 50 min  Short Term Goals: Week 2: SLP Short Term Goal 1 (Week 2): Pt will name familiar objects with min assist multimodal cues to recognize and correct verbal errors.   SLP Short Term Goal 2 (Week 2): Pt will utilize a basic, text based augmented communication system via pointing to convey immediate needs and wants in ~50% of opportunities with max assist multimodal cues SLP Short Term Goal 3 (Week 2): Pt will slow rate and increase vocal intensity to achieve intelligibility at the word level for ~75% accuracy with mod assist multimodal cues.   SLP Short Term Goal 4 (Week 2): Pt will consume therapeutic trials of honey thick liquids and purees with minimal overt s/s of aspiration and min cues for use of swallowing precautions over 3 consecutive targeted sessions prior to repeat objective study.   SLP Short Term Goal 5 (Week 2): Pt will sustain her attention to basic, familiar tasks for 5 minute intervals with min verbal cues for redirection.    Skilled Therapeutic Interventions:  Pt was seen for skilled ST targeting goals for communication and dysphagia.  Pt initiated request to use the bedpan upon therapist's arrival and needed min-mod verbal cues to slow rate and increase vocal intensity to achieve intelligibility.  Pt felt she needed to void but was unsuccesful, RN aware and present at the time of pt's attempt.  Pt was transferred to wheelchair where she remained for the duration of today's therapy session.  Pt consumed therapeutic trials of ice chips and purees to continue working towards initiation of PO diet.  Pt demonstrated improved motor planning and sequencing when completing oral care and was even able to "swish and spit" small amounts of water to clear residual  toothpaste from the oral cavity.  Pt demonstrated immediate coughing on 100% of trials of ice chips but no overt s/s of aspiration over ~5 teaspoons of puree.  Her automacity of self feeding and swallow response seems significantly improved from previous attempts and pt had minimal loss of boluses anteriorly.  Pt may be ready for repeat MBS by the end of this week should she demonstrate consistent gains over the next 1-2 days.  Pt was able to intelligibly verbalize the names of objects from playing cards and then use targeted words within a sentence with mod assist multimodal cues for phonemic placement.  Pt also utilized her communication board to convey less immediate concepts and ideas to therapist with supervision cues.  Pt was left in therapy gym in preparation of scheduled OT therapy session.  Continue per current plan of care.    Function:  Eating Eating   Modified Consistency Diet: (trials of ice chips and puree with SLP ) Eating Assist Level: Help with picking up utensils;Supervision or verbal cues;Set up assist for   Eating Set Up Assist For: Opening containers       Cognition Comprehension Comprehension assist level: Understands basic 75 - 89% of the time/ requires cueing 10 - 24% of the time  Expression   Expression assist level: Expresses basic 50 - 74% of the time/requires cueing 25 - 49% of the time. Needs to repeat parts of sentences.  Social Interaction Social Interaction assist level: Interacts appropriately 90% of the time - Needs monitoring or encouragement for participation or interaction.  Problem Solving Problem solving  assist level: Solves basic 50 - 74% of the time/requires cueing 25 - 49% of the time  Memory Memory assist level: Recognizes or recalls 50 - 74% of the time/requires cueing 25 - 49% of the time    Pain Pain Assessment Pain Assessment: No/denies pain  Therapy/Group: Individual Therapy  Lianah Peed, Selinda Orion 10/12/2017, 3:53 PM

## 2017-10-12 NOTE — Progress Notes (Signed)
Physical Therapy Session Note  Patient Details  Name: Kayla Kent MRN: [id number] Date of Birth: 1932-03-23  Today's Date: 10/12/2017 PT Individual Time: 0830-0925 PT Individual Time Calculation (min): 55 min   Short Term Goals: Week 2:  PT Short Term Goal 1 (Week 2): Pt will maintain midline in static standing 75% of the time PT Short Term Goal 2 (Week 2): Pt will consistently transfer with min assist PT Short Term Goal 3 (Week 2): Pt will maintain midline during dynamic standing balance activities 50% of the time  Skilled Therapeutic Interventions/Progress Updates:    Patient in bed requesting to go to restroom.  Patient supine to sit with HOB elevated with S and cues.  Patient sit to stand after assist to don shoes with min A and ambulated to bathroom min a with cues for balance and safety.  Patient performed self hygiene with cues and increased time.  Sit to stand mod A with cues for balance and hand placement.  Donned brief, shirt, pants and shoes in sitting and pulled up pants in standing.  Patient ambulated to EOB min A no device.  Nursing in room for meds.  Ambulated to sink with RW min A to comb hair with min A for balance. In w/c to propel to gym with feet with cues and S.  Patient at stairs step ups x 10 on first step min to mod A for LE strengthening.  In parallel bars for side stepping with cues and assist.  Patient in w/c propelled to room with S and cues and requesting again to toilet.  Assist to toilet ambulating no device min A and assist for hygiene this time due to difficulty cleaning after BM.  Assist to chair with waist beld and call button in reach.    Therapy Documentation Precautions:  Precautions Precautions: Fall Precaution Comments: R hemiplegia, expressive aphasis,  Restrictions Weight Bearing Restrictions: (P) No General:   Vital Signs: Therapy Vitals Pulse Rate: (!) 103 BP: (!) 154/78 Pain: Pain Assessment Pain Assessment: Faces Faces Pain Scale: No  hurt     See Function Navigator for Current Functional Status.   Therapy/Group: Individual Therapy  Reginia Naas 10/12/2017, 12:20 PM

## 2017-10-12 NOTE — Progress Notes (Signed)
Physical Therapy Weekly Progress Note  *LATE ENTRY*   Patient Details  Name: Kayla Kent MRN: [id number] Date of Birth: 30-Aug-1932  Beginning of progress report period: October 30, 2016 End of progress report period: November 06, 2016   Patient has met 4 of 5 short term goals. Kayla Kent demonstrates improved midline orientation in sitting however continues to push to the right during all static and dynamic standing activities including gait, requiring mod to max assist to maintain standing balance. Kayla Kent also demonstrates R inattention during functional activities, requiring max verbal/tactile cues to incorporate her R side into transfers and functional mobility. Kayla Kent has demonstrated improved overall activity tolerance over the past week and is able to tolerate staying OOB in between therapies more often.   Patient continues to demonstrate the following deficits muscle weakness, impaired timing and sequencing, motor apraxia, decreased coordination and decreased motor planning, decreased midline orientation and decreased attention to right and decreased standing balance, decreased postural control and decreased balance strategies and therefore will continue to benefit from skilled PT intervention to increase functional independence with mobility.  Patient progressing toward long term goals..  Continue plan of care.  PT Short Term Goals Week 1:  PT Short Term Goal 1 (Week 1): Pt will maintain static sitting balance w/ supervision PT Short Term Goal 1 - Progress (Week 1): Met PT Short Term Goal 2 (Week 1): Pt will transfer bed<>chair w/ mod assist PT Short Term Goal 2 - Progress (Week 1): Met PT Short Term Goal 3 (Week 1): Pt will self-propel w/c using L hemi technique w/ mod assist PT Short Term Goal 3 - Progress (Week 1): Met PT Short Term Goal 4 (Week 1): Pt will tolerate sitting OOB in between therapies for 2 hours at a time  PT Short Term Goal 4 - Progress (Week 1): Met PT Short Term Goal 5  (Week 1): Pt will maintain neutral trunk alignment in sitting or standing w/ minimal cues in 50% of trials  PT Short Term Goal 5 - Progress (Week 1): Progressing toward goal Week 2:  PT Short Term Goal 1 (Week 2): Pt will maintain midline in static standing 75% of the time PT Short Term Goal 2 (Week 2): Pt will consistently transfer with min assist PT Short Term Goal 3 (Week 2): Pt will maintain midline during dynamic standing balance activities 50% of the time   Therapy Documentation Precautions:  Precautions Precautions: Fall Precaution Comments: R hemiplegia, expressive aphasis,  Restrictions Weight Bearing Restrictions: No   See Function Navigator for Current Functional Status.  Therapy/Group: Individual Therapy  Netta Corrigan, PT , DPT 10/12/2017, 7:48 AM

## 2017-10-12 NOTE — Consult Note (Addendum)
[facility] Nurse wound consult note Reason for Consult: Consult requested for sacrum.  Pt had a previously noted stage 2 pressure injury, which has evolved into unstageable when assessed on 12/26 Measurement: .8X.8cm Wound bed: 100% tightly adhered yellow slough, located across sacrum and part of right buttock Drainage (amount, consistency, odor) small amt yellow drainage, no odor Periwound: intact skin surrounding Dressing procedure/placement/frequency: Santyl ointment for enzymatic debridement of nonviable tissue to affected area.  It is difficult to protect site from soiling related to frequent incontinence.  Discussed plan of care with patient. Please re-consult if further assistance is needed.  Thank-you,  Julien Girt MSN, [place], [place], [place], [place]

## 2017-10-12 NOTE — Progress Notes (Signed)
Infection precautions were initiated for droplet, airborne and enteric on 10/10/17. Pt has no DX or active infections to require these precautions. 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 Spoke with Levada Dy, RN Infection Prevention who confirmed that pt should only have Contact for MRSA etc.

## 2017-10-12 NOTE — Progress Notes (Signed)
Occupational Therapy Session Note  Patient Details  Name: Kayla Kent MRN: [id number] Date of Birth: 11/09/1931  Today's Date: 10/12/2017 OT Individual Time: 4010-2725 OT Individual Time Calculation (min): 54 min    Short Term Goals: Week 2:  OT Short Term Goal 1 (Week 2): Pt will perform LB dressing with mod assist for gripper socks and pull up pants.   OT Short Term Goal 2 (Week 2): Pt will complete LB bathing sit to stand with min assist 2 consecutive sessions.  OT Short Term Goal 3 (Week 2): Pt will complete toileting with min assist sit to stand.   OT Short Term Goal 4 (Week 2): Pt will donn pullover shirt with supervision and mod instructional cueing.  Skilled Therapeutic Interventions/Progress Updates:    Treatment session focused on ADL/self care training, transfer training, balance training, NMR, and activity tolerance. Pt was received in therapy gym from previous therapy . Pt completed sit<>stand transfers from w/c for table top activity. Noted L sided neglect during activity. Therapist provided v/c for hand/foot placement and min A. Pt tolerated standing for up to 8 minutes with L UE supported for functional reaching activity. Pt continues to have difficulty with verbal expression but when given an option is able to communicate her needs. Pt completed NMR in sitting for R Ue for shoulder flexion/extention to increase stability and strength. Returned to room via w/c for toileting task. Transfers from w/c to toilet with min A and t/c for hand and foot placement. Pt required A for clothing management. Pt put on make up per request with mod A for gathering items (placed to left side). Pt completed w/c to bed tx with min A, noted pushers effect and require min A for bed positioning with instructions for bridging technique from therapist. Pt left resting in bed at 30* with call bell within reach.   Therapy Documentation Precautions:  Precautions Precautions: Fall Precaution Comments: R  hemiplegia, expressive aphasis,  Restrictions Weight Bearing Restrictions: (P) No General:   Vital Signs: Therapy Vitals Temp: 97.6 F (36.4 C) Temp Source: Oral Pulse Rate: 76 Resp: 14 BP: 137/68 Patient Position (if appropriate): Lying Oxygen Therapy SpO2: (!) 76 %(after O2 reading, Pt. was put on 1.5 Liters of O2) O2 Device: Not Delivered Pain: Pain Assessment Pain Assessment: No/denies pain ADL:   Vision   Perception    Praxis   Exercises:   Other Treatments:    See Function Navigator for Current Functional Status.   Therapy/Group: Individual Therapy  Delon [place] 10/12/2017, 4:13 PM

## 2017-10-13 ENCOUNTER — Inpatient Hospital Stay (HOSPITAL_COMMUNITY): Payer: Self-pay | Admitting: Occupational Therapy

## 2017-10-13 ENCOUNTER — Inpatient Hospital Stay (HOSPITAL_COMMUNITY): Payer: Self-pay | Admitting: Speech Pathology

## 2017-10-13 ENCOUNTER — Telehealth: Payer: Self-pay

## 2017-10-13 ENCOUNTER — Inpatient Hospital Stay (HOSPITAL_COMMUNITY): Payer: Self-pay | Admitting: Physical Therapy

## 2017-10-13 DIAGNOSIS — F411 Generalized anxiety disorder: Secondary | ICD-10-CM

## 2017-10-13 DIAGNOSIS — R339 Retention of urine, unspecified: Secondary | ICD-10-CM | POA: Insufficient documentation

## 2017-10-13 LAB — GLUCOSE, CAPILLARY
GLUCOSE-CAPILLARY: 117 mg/dL — AB (ref 65–99)
GLUCOSE-CAPILLARY: 83 mg/dL (ref 65–99)
GLUCOSE-CAPILLARY: 91 mg/dL (ref 65–99)
Glucose-Capillary: 113 mg/dL — ABNORMAL HIGH (ref 65–99)
Glucose-Capillary: 107 mg/dL — ABNORMAL HIGH (ref 65–99)
Glucose-Capillary: 105 mg/dL — ABNORMAL HIGH (ref 65–99)

## 2017-10-13 MED ORDER — TRAZODONE HCL 50 MG PO TABS
25.0000 mg | ORAL_TABLET | Freq: Every day | ORAL | Status: AC
  Administered 2017-10-13: 25 mg via ORAL

## 2017-10-13 MED ORDER — ESCITALOPRAM OXALATE 10 MG PO TABS
5.0000 mg | ORAL_TABLET | Freq: Every day | ORAL | Status: DC
  Administered 2017-10-13 – 2017-10-24 (×12): 5 mg via ORAL
  Filled 2017-10-13 (×13): qty 1

## 2017-10-13 NOTE — Progress Notes (Signed)
Physical Therapy Session Note  Patient Details  Name: Kayla Kent MRN: [id number] Date of Birth: [birth year]  Today's Date: 10/13/2017 PT Individual Time: 1000-1055 PT Individual Time Calculation (min): 55 min   Short Term Goals: Week 2:  PT Short Term Goal 1 (Week 2): Pt will maintain midline in static standing 75% of the time PT Short Term Goal 2 (Week 2): Pt will consistently transfer with min assist PT Short Term Goal 3 (Week 2): Pt will maintain midline during dynamic standing balance activities 50% of the time  Skilled Therapeutic Interventions/Progress Updates:   Pt in w/c upon arrival and agreeable to therapy, no c/o pain. RN present to disconnect peg feeding during treatment. Pt self-propelled w/c throughout unit w/ supervision using BLEs in 100-150' bouts in between therapy activities to work on independence w/ functional mobility. Occasional verbal cues for obstacle avoidance on R side. Ambulated 100' x2 and 125' x1 w/ min assist for balance and RW management especially w/ RUE in orthosis. Pt w/ increased L pushing w/ fatigue towards end of walks, however assistance required for safety did not increase during fatigued moments. Focused on NMR in between walks w/ emphasis on L WB to decrease pushing. Min assist for balance (R HHA) during L UE reaching tasks outside of BOS on L. Additionally performed lateral and anterior/posterior weight-shifting on Biodex. 90% accuracy 1 min x2 w/ lateral, 40% accuracy 1 min w/ anterior/posterior. Min assist for facilitating weight shifting on biodex. Returned to room and ended session in supine, call bell within reach and all needs met.   Therapy Documentation Precautions:  Precautions Precautions: Fall Precaution Comments: R hemiplegia, expressive aphasia Restrictions Weight Bearing Restrictions: No Pain: Pain Assessment Pain Assessment: No/denies pain  See Function Navigator for Current Functional Status.   Therapy/Group: Individual  Therapy  Daneshia Tavano K Arnette 10/13/2017, 11:02 AM

## 2017-10-13 NOTE — Progress Notes (Signed)
Occupational Therapy Session Note  Patient Details  Name: Kayla Kent MRN: [id number] Date of Birth: 05/12/1932  Today's Date: 10/13/2017 OT Individual Time: 1450-1539 OT Individual Time Calculation (min): 49 min    Short Term Goals: Week 3:  OT Short Term Goal 1 (Week 3): Continue working on established LTGs set at supervision to modified independent level.  Skilled Therapeutic Interventions/Progress Updates:    Pt completed transfer from bed to wheelchair stand pivot with min assist.  Once in the chair, therapist transported her down to the therapy gym where she transferred to the therapy mat at a mod assist level secondary to posterior lean.  Therapist applied NMES to digit flexors and extensors in alternating setting.  Intensity was set at level 24 for both extensors and flexors with PPS at 35 and pulse width at 500.  She tolerated 10 mins of stimulation to the extensors with 5 mins to flexors.  Noted difficulty getting adequate digit flexion secondary to digits wanting to flex at the DIP and PIP joints when intensity was increased to 30.  When it was decreased better extension was seen with intensity at level 24.  Still with decreased activation of the first digit PIP and DIP however.  Had pt integrate functional reach with shoulder and elbow to target to grasp and release while stimulation was active.  Once completed pt was asked to grasp cup from the bedside table and then bring to mouth for simulated drinking.  She needed mod assist to grasp cup with max assist to release for repositioning of the cup back on the table.  Finished session with ambulation back to the room with mod assist and mod demonstrational cueing for upright posture and larger steps on the right side.  Pt tends to rotate to the right as well during functional mobility.  Pt placed back in the bed with call button and phone in reach and bed alarm in place.    Therapy Documentation Precautions:  Precautions Precautions:  Fall Precaution Comments: R hemiplegia, expressive aphasia Restrictions Weight Bearing Restrictions: No  Pain: Pain Assessment Pain Assessment: Faces Pain Score: 0-No pain ADL: See Function Navigator for Current Functional Status.   Therapy/Group: Individual Therapy  Signe Tackitt OTR/L 10/13/2017, 3:52 PM

## 2017-10-13 NOTE — Progress Notes (Signed)
Speech Language Pathology Daily Session Note  Patient Details  Name: Kayla Kent MRN: [id number] Date of Birth: 07-22-1932  Today's Date: 10/13/2017 SLP Individual Time: 1334-1415 SLP Individual Time Calculation (min): 41 min  Short Term Goals: Week 2: SLP Short Term Goal 1 (Week 2): Pt will name familiar objects with min assist multimodal cues to recognize and correct verbal errors.   SLP Short Term Goal 2 (Week 2): Pt will utilize a basic, text based augmented communication system via pointing to convey immediate needs and wants in ~50% of opportunities with max assist multimodal cues SLP Short Term Goal 3 (Week 2): Pt will slow rate and increase vocal intensity to achieve intelligibility at the word level for ~75% accuracy with mod assist multimodal cues.   SLP Short Term Goal 4 (Week 2): Pt will consume therapeutic trials of honey thick liquids and purees with minimal overt s/s of aspiration and min cues for use of swallowing precautions over 3 consecutive targeted sessions prior to repeat objective study.   SLP Short Term Goal 5 (Week 2): Pt will sustain her attention to basic, familiar tasks for 5 minute intervals with min verbal cues for redirection.    Skilled Therapeutic Interventions:  Pt was seen for skilled ST targeting goals for communication and dysphagia.  Pt had initiated use of call bell prior to therapist's arrival.  Upon therapist's arrival, pt used communication board to convey needs to therapist with min assist verbal cues.  Pt was able to verbalize functional phrases from her communication board with mod assist for slow rate and overarticulation to achieve intelligibility.  Pt also needed mod assist to recognize and correct verbal errors.  Pt continues to demonstrate improved management of her secretions with minimal wetness to her vocal quality.  Pt had immediate throat clear in 100% of trials of ice chips but no overt s/s of aspiration with small amounts of purees.  Will  proceed with MBS tomorrow to determine progress towards initiation of PO diet.  Pt was returned to bed per her request with bed alarm set and call bell within reach.  Continue per current plan of care.    Function:  Eating Eating   Modified Consistency Diet: (trials of ice chips and puree with SLP ) Eating Assist Level: Helper feeds patient           Cognition Comprehension Comprehension assist level: Understands basic 75 - 89% of the time/ requires cueing 10 - 24% of the time  Expression Expression assistive device: Communication board Expression assist level: Expresses basic 50 - 74% of the time/requires cueing 25 - 49% of the time. Needs to repeat parts of sentences.  Social Interaction Social Interaction assist level: Interacts appropriately 90% of the time - Needs monitoring or encouragement for participation or interaction.  Problem Solving Problem solving assist level: Solves basic 25 - 49% of the time - needs direction more than half the time to initiate, plan or complete simple activities  Memory Memory assist level: Recognizes or recalls 50 - 74% of the time/requires cueing 25 - 49% of the time    Pain Pain Assessment Pain Assessment: No/denies pain Pain Score: 0-No pain  Therapy/Group: Individual Therapy  Devin Foskey, Selinda Orion 10/13/2017, 4:26 PM
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
## 2017-10-13 NOTE — Telephone Encounter (Signed)
Pt currently still admitted. Will route encounter to myself to f/u on once pt is discharged.

## 2017-10-13 NOTE — Telephone Encounter (Signed)
-----   Message from Marshell Garfinkel, MD sent at 10/07/2017 11:26 AM EST ----- Regarding: Clinic follow up Hi,  Pt was seen by Dr, Ashok Cordia and is in hospital now. Can you make a follow up CT without contrast for follow up of lung nodule in Feb 2019 and clinic visit after that. Thanks.  Praveen

## 2017-10-13 NOTE — Progress Notes (Signed)
Social Work Patient ID: Kayla Kent, female   DOB: 20-Oct-1931, 83 y.o.   MRN: [id number]  Spoke with michelle-daughter via telephone to confirm family conference tomorrow @ 10:00. Daughter wanted to know if palliative care would be there, informed her they would not if they wanted an order they would need to get MD to write for one. Made aware discharge extended to 1/5. Will discuss concerns at meeting tomorrow.
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
## 2017-10-13 NOTE — Progress Notes (Signed)
[facility] PHYSICAL MEDICINE & REHABILITATION     PROGRESS NOTE  Subjective/Complaints:  Patient seen lying in bed this morning. She states she slept well overnight.  ROS: Denies CP, SOB, N/V/D.  Objective: Vital Signs: Blood pressure (!) 120/40, pulse 74, temperature 98 F (36.7 C), temperature source Oral, resp. rate 16, height 5\' 7"  (1.702 m), weight 43.1 kg (95 lb 0.3 oz), SpO2 98 %. No results found. No results for input(s): WBC, HGB, HCT, PLT in the last 72 hours. No results for input(s): NA, K, CL, GLUCOSE, BUN, CREATININE, CALCIUM in the last 72 hours.  Invalid input(s): CO CBG (last 3)  Recent Labs    10/12/17 2021 10/13/17 0000 10/13/17 0623  GLUCAP 114* 89 113*    Wt Readings from Last 3 Encounters:  10/13/17 43.1 kg (95 lb 0.3 oz)  09/28/17 46.4 kg (102 lb 4.7 oz)  09/19/17 49.8 kg (109 lb 12.6 oz)    Physical Exam:  BP (!) 120/40 (BP Location: Left Arm)   Pulse 74   Temp 98 F (36.7 C) (Oral)   Resp 16   Ht 5\' 7"  (1.702 m)   Wt 43.1 kg (95 lb 0.3 oz)   SpO2 98%   BMI 14.88 kg/m  Constitutional: She appears well-developed. She appears cachectic. NAD.  HENT: Normocephalic and atraumatic.  Eyes: No discharge. Dysconjugate gaze with right eye in lateral field (stable).  Cardiovascular: Irregularly irregular without JVD. Respiratory: Upper airway sounds. unlabored. GI: Bowel sounds are normal. She exhibits no distension. +PEG. Musculoskeletal: She exhibits no edema or tenderness in extremities.  Neurological: She is alert. Right facial droop Severe dysarthria, improving, ?expressive aphasia.  Able  to follow simple one step motor commands.  Motor: RUE: 4/5 shoulder abduction, 4-/5 elbow flex/ext, 1/5 wrist extension, 1. +/5 hand grip. RLE: 4+/5 proximal to distal (stable) Skin: Skin is warm and dry. She is not diaphoretic.  Psychiatric: She is flat.  Assessment/Plan: 1. Functional deficits secondary to left MCA infarct which require 3+ hours per day  of interdisciplinary therapy in a comprehensive inpatient rehab setting. Physiatrist is providing close team supervision and 24 hour management of active medical problems listed below. Physiatrist and rehab team continue to assess barriers to discharge/monitor patient progress toward functional and medical goals.  Function:  Bathing Bathing position   Position: Wheelchair/chair at sink  Bathing parts Body parts bathed by patient: Right arm, Chest, Abdomen, Right upper leg, Left upper leg, Right lower leg Body parts bathed by helper: Left arm, Buttocks, Front perineal area, Back, Left lower leg  Bathing assist Assist Level: Touching or steadying assistance(Pt > 75%)      Upper Body Dressing/Undressing Upper body dressing   What is the patient wearing?: Pull over shirt/dress     Pull over shirt/dress - Perfomed by patient: Thread/unthread left sleeve, Put head through opening, Pull shirt over trunk Pull over shirt/dress - Perfomed by helper: Thread/unthread right sleeve        Upper body assist Assist Level: Touching or steadying assistance(Pt > 75%)      Lower Body Dressing/Undressing Lower body dressing   What is the patient wearing?: Shoes, Pants     Pants- Performed by patient: Thread/unthread right pants leg Pants- Performed by helper: Pull pants up/down, Thread/unthread left pants leg   Non-skid slipper socks- Performed by helper: Don/doff right sock, Don/doff left sock   Socks - Performed by helper: Don/doff left sock, Don/doff right sock Shoes - Performed by patient: Don/doff left shoe Shoes - Performed  by helper: Don/doff left shoe, Don/doff right shoe          Lower body assist Assist for lower body dressing: Touching or steadying assistance (Pt > 75%)      Toileting Toileting Toileting activity did not occur: No continent bowel/bladder event Toileting steps completed by patient: Adjust clothing prior to toileting Toileting steps completed by helper: Performs  perineal hygiene, Adjust clothing after toileting, Adjust clothing prior to toileting Toileting Assistive Devices: Grab bar or rail  Toileting assist Assist level: Touching or steadying assistance (Pt.75%)   Transfers Chair/bed transfer   Chair/bed transfer method: Squat pivot Chair/bed transfer assist level: Moderate assist (Pt 50 - 74%/lift or lower) Chair/bed transfer assistive device: Armrests     Locomotion Ambulation Ambulation activity did not occur: Safety/medical concerns   Max distance: 20 Assist level: Touching or steadying assistance (Pt > 75%)   Wheelchair   Type: Manual Max wheelchair distance: 150 Assist Level: Supervision or verbal cues  Cognition Comprehension Comprehension assist level: Understands basic 75 - 89% of the time/ requires cueing 10 - 24% of the time  Expression Expression assist level: Expresses basic 50 - 74% of the time/requires cueing 25 - 49% of the time. Needs to repeat parts of sentences.  Social Interaction Social Interaction assist level: Interacts appropriately 90% of the time - Needs monitoring or encouragement for participation or interaction.  Problem Solving Problem solving assist level: Solves basic 50 - 74% of the time/requires cueing 25 - 49% of the time  Memory Memory assist level: Recognizes or recalls 50 - 74% of the time/requires cueing 25 - 49% of the time    Medical Problem List and Plan: 1.  Hemiparesis, aphasia, dysphagia secondary to Left MCA infarct.   Continue CIR   WHO ordered, not in place this AM 2.  DVT Prophylaxis/Anticoagulation: Pharmaceutical: Other (comment)--Eliquis 3. Pain Management: Tylenol prn  4. Mood: LCSW to follow for evaluation and support.    Lexapro started on 12/27 5. Neuropsych: This patient is not fully capable of making decisions on her own behalf. 6. Skin/Wound Care: Pressure relief measures.Maintain adequate hydration/nutritional status.  7. Fluids/Electrolytes/Nutrition: Adjust tube feeds to  maintain adequate nutritional status. Consulted dietitian for input and adjustment.    BMP within acceptable range on 12/24 8. RUL/RLL aspiration PNA   Scheduled Robitussin-DM to help with cough and secretions    Repeat chest x-ray reviewed, showing persistent right upper lobe disease, but resolution of right lower lobe infiltrate. Per Pulm, pro calcitonin ordered, reviewed, WNL, antibiotics DC'd. No need for further imaging at this time. Follow-up CT in 3 months   Repeat chest x-ray reviewed, showing stable abnormality.   Continue to monitor 9. ?CKD stage III: Monitor renal status with routine checks. Water flushes via PEG.    Creatinine 0.67 on 12/24   Appears to be controlled with adequate hydration 10 Recent GIB: Back on Eliquis.    Hemoglobin 10.9 on 12/24   Continue to monitor 11. Seizure disorder: stable/controlled on Keppra. 12. A Fib: Monitor HR bid. Continue metoprolol and decrease ASA to 81 mg/day.    Heart rate controlled on 12/27 13. Thrombocytosis: Resolved   Likely reactive.    Will continue to monitor.  14. Dysphagia: NPO (holding bite trials with ST), tolerating tube feeds 15. Protein calorie malnutrition: Chronic with baseline albumin around 2.5 in the past 6 months.    Pre-albumin 16.9 on 12/13.    Continue to monitor 16. COPD (per X ray)/RUL bronchogenic lung CA: completed XRT  06/2017 17. Hypokalemia: Resolved   Potassium 4.5 on 12/24   Continue to monitor 18. Leukocytosis: Resolved  Cont to monitor 19. Hyperglycemia   Likely secondary to tube feeds   Relatively controlled on 12/27 20. Urinary frequency  Appears to be retaining now, Ditropan DC'd   Will consider further meds tomorrow 21. Confusion per her daughter: Resolved   UA unremarkable, urine culture remains pending   No increasing confusion noted per my exam or per therapy  LOS (Days) 15 A FACE TO FACE EVALUATION WAS PERFORMED  Zaydrian Batta Lorie Phenix 10/13/2017 8:27 AM

## 2017-10-14 ENCOUNTER — Inpatient Hospital Stay (HOSPITAL_COMMUNITY): Payer: Medicare Other

## 2017-10-14 ENCOUNTER — Inpatient Hospital Stay (HOSPITAL_COMMUNITY): Payer: Self-pay | Admitting: Physical Therapy

## 2017-10-14 ENCOUNTER — Inpatient Hospital Stay (HOSPITAL_COMMUNITY): Payer: Self-pay | Admitting: Speech Pathology

## 2017-10-14 ENCOUNTER — Encounter (HOSPITAL_COMMUNITY): Payer: Self-pay | Admitting: Speech Pathology

## 2017-10-14 ENCOUNTER — Inpatient Hospital Stay (HOSPITAL_COMMUNITY): Payer: Self-pay | Admitting: Occupational Therapy

## 2017-10-14 DIAGNOSIS — Z515 Encounter for palliative care: Secondary | ICD-10-CM

## 2017-10-14 LAB — GLUCOSE, CAPILLARY
GLUCOSE-CAPILLARY: 97 mg/dL (ref 65–99)
GLUCOSE-CAPILLARY: 83 mg/dL (ref 65–99)
GLUCOSE-CAPILLARY: 77 mg/dL (ref 65–99)
Glucose-Capillary: 125 mg/dL — ABNORMAL HIGH (ref 65–99)
Glucose-Capillary: 59 mg/dL — ABNORMAL LOW (ref 65–99)
Glucose-Capillary: 117 mg/dL — ABNORMAL HIGH (ref 65–99)

## 2017-10-14 MED ORDER — OSMOLITE 1.5 CAL PO LIQD
285.0000 mL | Freq: Four times a day (QID) | ORAL | Status: DC
  Administered 2017-10-14 – 2017-10-15 (×2): 285 mL
  Administered 2017-10-15: 200 mL
  Administered 2017-10-15 – 2017-10-17 (×6): 285 mL
  Filled 2017-10-14 (×13): qty 474

## 2017-10-14 MED ORDER — INSULIN ASPART 100 UNIT/ML ~~LOC~~ SOLN
0.0000 [IU] | Freq: Three times a day (TID) | SUBCUTANEOUS | Status: DC
  Administered 2017-10-16: 1 [IU] via SUBCUTANEOUS

## 2017-10-14 MED ORDER — GLUCOSE 40 % PO GEL
ORAL | Status: AC
  Filled 2017-10-14: qty 1

## 2017-10-14 MED ORDER — BETHANECHOL CHLORIDE 10 MG PO TABS
10.0000 mg | ORAL_TABLET | Freq: Three times a day (TID) | ORAL | Status: DC
  Administered 2017-10-14 – 2017-10-24 (×31): 10 mg via ORAL
  Filled 2017-10-14 (×33): qty 1

## 2017-10-14 MED ORDER — OSMOLITE 1.5 CAL PO LIQD
285.0000 mL | Freq: Four times a day (QID) | ORAL | Status: DC
  Administered 2017-10-14: 285 mL
  Filled 2017-10-14 (×4): qty 474

## 2017-10-14 NOTE — Plan of Care (Signed)
Pt has unstageable area on sacrum Santyl dressings daily and prn Pt denies any pain Bolus feeds via peg tube

## 2017-10-14 NOTE — Progress Notes (Addendum)
[facility] PHYSICAL MEDICINE & REHABILITATION     PROGRESS NOTE  Subjective/Complaints:  Pt seen laying in bed this AM.  She slept well overnight. Speech is more clear this AM. Family conference today.   ROS: Denies CP, SOB, N/V/D.  Objective: Vital Signs: Blood pressure (!) 100/47, pulse 75, temperature (!) 97.5 F (36.4 C), temperature source Oral, resp. rate 18, height 5\' 7"  (1.702 m), weight 44.6 kg (98 lb 5.2 oz), SpO2 100 %. No results found. No results for input(s): WBC, HGB, HCT, PLT in the last 72 hours. No results for input(s): NA, K, CL, GLUCOSE, BUN, CREATININE, CALCIUM in the last 72 hours.  Invalid input(s): CO CBG (last 3)  Recent Labs    10/13/17 2358 10/14/17 0623 10/14/17 0758  GLUCAP 117* 97 125*    Wt Readings from Last 3 Encounters:  10/14/17 44.6 kg (98 lb 5.2 oz)  09/28/17 46.4 kg (102 lb 4.7 oz)  09/19/17 49.8 kg (109 lb 12.6 oz)    Physical Exam:  BP (!) 100/47 (BP Location: Left Arm)   Pulse 75   Temp (!) 97.5 F (36.4 C) (Oral)   Resp 18   Ht 5\' 7"  (1.702 m)   Wt 44.6 kg (98 lb 5.2 oz)   SpO2 100%   BMI 15.40 kg/m  Constitutional: She appears well-developed. She appears cachectic. NAD.  HENT: Normocephalic and atraumatic.  Eyes: No discharge. Dysconjugate gaze with right eye in lateral field (stable).  Cardiovascular: Irregularly irregular without JVD. Respiratory: Upper airway sounds. Unlabored. GI: Bowel sounds are normal. She exhibits no distension. +PEG. Musculoskeletal: She exhibits no edema or tenderness in extremities.  Neurological: She is alert. Right facial droop Severe dysarthria, slowly improving.  Able  to follow simple one step motor commands.  Motor: RUE: 4/5 shoulder abduction, 4-/5 elbow flex/ext, 1/5 wrist extension, 2/5 hand grip. RLE: 4+/5 proximal to distal (stable) Skin: Skin is warm and dry. She is not diaphoretic.  Psychiatric: She is flat.  Assessment/Plan: 1. Functional deficits secondary to left MCA  infarct which require 3+ hours per day of interdisciplinary therapy in a comprehensive inpatient rehab setting. Physiatrist is providing close team supervision and 24 hour management of active medical problems listed below. Physiatrist and rehab team continue to assess barriers to discharge/monitor patient progress toward functional and medical goals.  Function:  Bathing Bathing position   Position: Wheelchair/chair at sink  Bathing parts Body parts bathed by patient: Right arm, Chest, Abdomen, Right upper leg, Left upper leg, Right lower leg, Front perineal area, Buttocks, Left lower leg Body parts bathed by helper: Back, Left arm  Bathing assist Assist Level: Touching or steadying assistance(Pt > 75%)      Upper Body Dressing/Undressing Upper body dressing   What is the patient wearing?: Pull over shirt/dress     Pull over shirt/dress - Perfomed by patient: Thread/unthread left sleeve, Put head through opening, Pull shirt over trunk Pull over shirt/dress - Perfomed by helper: Thread/unthread right sleeve        Upper body assist Assist Level: Touching or steadying assistance(Pt > 75%)      Lower Body Dressing/Undressing Lower body dressing   What is the patient wearing?: Shoes, Pants, Socks     Pants- Performed by patient: Thread/unthread right pants leg, Thread/unthread left pants leg Pants- Performed by helper: Pull pants up/down   Non-skid slipper socks- Performed by helper: Don/doff right sock, Don/doff left sock   Socks - Performed by helper: Don/doff right sock, Don/doff left sock Shoes -  Performed by patient: Don/doff right shoe Shoes - Performed by helper: Don/doff left shoe          Lower body assist Assist for lower body dressing: Touching or steadying assistance (Pt > 75%)      Toileting Toileting Toileting activity did not occur: No continent bowel/bladder event Toileting steps completed by patient: Adjust clothing prior to toileting Toileting steps  completed by helper: Adjust clothing prior to toileting, Performs perineal hygiene, Adjust clothing after toileting Toileting Assistive Devices: Grab bar or rail  Toileting assist Assist level: Touching or steadying assistance (Pt.75%)   Transfers Chair/bed transfer   Chair/bed transfer method: Stand pivot, Ambulatory Chair/bed transfer assist level: Touching or steadying assistance (Pt > 75%) Chair/bed transfer assistive device: Armrests, Medical sales representative Ambulation activity did not occur: Safety/medical concerns   Max distance: 125' Assist level: Touching or steadying assistance (Pt > 75%)   Wheelchair   Type: Manual Max wheelchair distance: 150 Assist Level: Supervision or verbal cues  Cognition Comprehension Comprehension assist level: Understands basic 75 - 89% of the time/ requires cueing 10 - 24% of the time  Expression Expression assist level: Expresses basic 50 - 74% of the time/requires cueing 25 - 49% of the time. Needs to repeat parts of sentences.  Social Interaction Social Interaction assist level: Interacts appropriately 90% of the time - Needs monitoring or encouragement for participation or interaction.  Problem Solving Problem solving assist level: Solves basic 25 - 49% of the time - needs direction more than half the time to initiate, plan or complete simple activities  Memory Memory assist level: Recognizes or recalls 50 - 74% of the time/requires cueing 25 - 49% of the time    Medical Problem List and Plan: 1.  Hemiparesis, aphasia, dysphagia secondary to Left MCA infarct.   Continue CIR   WHO  2.  DVT Prophylaxis/Anticoagulation: Pharmaceutical: Other (comment)--Eliquis 3. Pain Management: Tylenol prn  4. Mood: LCSW to follow for evaluation and support.    Lexapro started on 12/27 5. Neuropsych: This patient is not fully capable of making decisions on her own behalf. 6. Skin/Wound Care: Pressure relief measures.Maintain adequate  hydration/nutritional status.  7. Fluids/Electrolytes/Nutrition: Adjust tube feeds to maintain adequate nutritional status. Consulted dietitian for input and adjustment.    BMP within acceptable range on 12/24 8. RUL/RLL aspiration PNA   Scheduled Robitussin-DM to help with cough and secretions    Repeat chest x-ray reviewed, showing persistent right upper lobe disease, but resolution of right lower lobe infiltrate. Per Pulm, pro calcitonin ordered, reviewed, WNL, antibiotics DC'd. No need for further imaging at this time. Follow-up CT in 3 months   Repeat chest x-ray reviewed, showing stable abnormality.   Continue to monitor 9. ?CKD stage III: Monitor renal status with routine checks. Water flushes via PEG.    Creatinine 0.67 on 12/24   Labs ordered for Monday   Appears to be controlled with adequate hydration 10 Recent GIB: Back on Eliquis.    Hemoglobin 10.9 on 12/24   Labs ordered for Monday   Continue to monitor 11. Seizure disorder: stable/controlled on Keppra. 12. A Fib: Monitor HR bid. Continue metoprolol and decrease ASA to 81 mg/day.    Heart rate controlled on 12/28 13. Thrombocytosis: Resolved   Likely reactive.    Will continue to monitor.  14. Dysphagia: NPO (holding bite trials with ST), tolerating tube feeds 15. Protein calorie malnutrition: Chronic with baseline albumin around 2.5 in the past 6 months.  Pre-albumin 16.9 on 12/13.    Continue to monitor 16. COPD (per X ray)/RUL bronchogenic lung CA: completed XRT 06/2017 17. Hypokalemia: Resolved   Potassium 4.5 on 12/24   Labs ordered for Monday   Continue to monitor 18. Leukocytosis: Resolved  Cont to monitor 19. Hyperglycemia   Secondary to tube feeds   Relatively controlled on 12/28 20. Urinary frequency  Appears to be retaining now, Ditropan DC'd   Will consider further meds tomorrow   Bethanechol 10 started on 12/28  >60 minutes spent today with >50 minutes in counseling family regarding medical issues  and functional deficits as well as prognosis  LOS (Days) 16 A FACE TO FACE EVALUATION WAS PERFORMED  Kayla Kent Kayla Kent 10/14/2017 8:39 AM

## 2017-10-14 NOTE — Progress Notes (Signed)
Speech Language Pathology Weekly Progress and Session Note  Patient Details  Name: Kayla Kent MRN: [id number] Date of Birth: 08-08-1932  Beginning of progress report period:  October 07, 2017   End of progress report period: October 14, 2017   Today's Date: 10/14/2017 SLP Individual Time: 1135-1200 SLP Individual Time Calculation (min): 25 min  Short Term Goals: Week 2: SLP Short Term Goal 1 (Week 2): Pt will name familiar objects with min assist multimodal cues to recognize and correct verbal errors.   SLP Short Term Goal 1 - Progress (Week 2): Progressing toward goal SLP Short Term Goal 2 (Week 2): Pt will utilize a basic, text based augmented communication system via pointing to convey immediate needs and wants in ~50% of opportunities with max assist multimodal cues SLP Short Term Goal 2 - Progress (Week 2): Met SLP Short Term Goal 3 (Week 2): Pt will slow rate and increase vocal intensity to achieve intelligibility at the word level for ~75% accuracy with mod assist multimodal cues.   SLP Short Term Goal 3 - Progress (Week 2): Met SLP Short Term Goal 4 (Week 2): Pt will consume therapeutic trials of honey thick liquids and purees with minimal overt s/s of aspiration and min cues for use of swallowing precautions over 3 consecutive targeted sessions prior to repeat objective study.   SLP Short Term Goal 4 - Progress (Week 2): Met SLP Short Term Goal 5 (Week 2): Pt will sustain her attention to basic, familiar tasks for 5 minute intervals with min verbal cues for redirection.   SLP Short Term Goal 5 - Progress (Week 2): Met    New Short Term Goals: Week 3: SLP Short Term Goal 1 (Week 3): Pt will name familiar objects with min assist multimodal cues to recognize and correct verbal errors.   SLP Short Term Goal 2 (Week 3): Pt will utilize a basic, text based augmented communication system via pointing to convey immediate needs and wants in ~50% of opportunities with supervision  cues SLP Short Term Goal 3 (Week 3): Pt will slow rate and increase vocal intensity to achieve intelligibility at the word level for ~75% accuracy with min assist multimodal cues.   SLP Short Term Goal 4 (Week 3): Pt will sustain her attention to basic, familiar tasks for 10 minute intervals with min verbal cues for redirection.   SLP Short Term Goal 5 (Week 3): Pt will complete basic, functional tasks with mod assist for functional problem solving SLP Short Term Goal 6 (Week 3): Pt will consume therapeutic trials of honey thick liquids and purees with minimal overt s/s of aspiration supervision cues for use of swallowing precautions.  Weekly Progress Updates:  Pt has made functional gains this reporting period and has met 4 out of 5 short term goals.  Pt has demonstrated improved management of secretions and automaticity of oral phase of swallow but continues to have significant oral motor deficits and SLP continues to recommend NPO per [facility] 12/28.  Pt's verbal expression has improved significantly and she is overall min-mod assist for naming, intelligibility, and awareness of verbal errors. Pt and family education is ongoing.  Pt would continue to benefit from skilled ST to address swallowing and communication.  Anticipate that pt will need 24/7 supervision at discharge and ST follow up at next level of care .    Intensity: Minumum of 1-2 x/day, 30 to 90 minutes Frequency: 3 to 5 out of 7 days Duration/Length of Stay: 21-28 days  Treatment/Interventions:  Cognitive remediation/compensation;Dysphagia/aspiration precaution training;Cueing hierarchy;Environmental controls;Internal/external aids;Speech/Language facilitation;Patient/family education;Multimodal communication approach;Functional tasks   Daily Session  Skilled Therapeutic Interventions: Pt was seen for skilled ST targeting communication goals.  Pt was able to name objects in pictures and then utilize targeted words in a sentence with  min-mod assist verbal cues to recognize and correct verbal errors.   When returning to room, pt needed min encouragement to verbally convey her needs and wants to therapist versus pointing or gesturing.  Pt was left in bed with bed alarm set and call bell within reach.  Goals updated on this date to reflect current progress and plan of care.      Function:   Eating Eating               Cognition Comprehension Comprehension assist level: Understands basic 75 - 89% of the time/ requires cueing 10 - 24% of the time  Expression Expression assistive device: Communication board Expression assist level: Expresses basic 50 - 74% of the time/requires cueing 25 - 49% of the time. Needs to repeat parts of sentences.  Social Interaction Social Interaction assist level: Interacts appropriately 90% of the time - Needs monitoring or encouragement for participation or interaction.  Problem Solving Problem solving assist level: Solves basic 25 - 49% of the time - needs direction more than half the time to initiate, plan or complete simple activities  Memory Memory assist level: Recognizes or recalls 50 - 74% of the time/requires cueing 25 - 49% of the time   General    Pain Pain Assessment Pain Assessment: No/denies pain  Therapy/Group: Individual Therapy  Lynze Reddy, Selinda Orion 10/14/2017, 5:17 PM

## 2017-10-14 NOTE — Progress Notes (Signed)
Patient/Family Geographical information systems officer in attendance:Amy-PT, James-OT, MD, Nicole-SP, Whitney-RN, Daughter, son in-law, son via skype and social Social research officer, government in attendance: See above  Main focus:family concern's and questions regarding pt's care and function. Wanted whole team together to address all of these. Many issues from acute side of the hospital and feeling lied too and not communicated with in regards to their mother's care.  Synopsis of information shared:Therapy team discussed the progress pt was making and how much care she would require upon discharge from rehab. MD addressed medical issues and questions family had regarding medications, orders and protocols.  Barriers/concerns expressed by patient and family:Family feels MD's on acute were not truthful with regarding having Mom have a PEG placed and the Living Will she has and how this was not followed, due to way explained to them by MD's on acute. Palliative was never involved and should have been to discuss goals of care prior to PEG being placed.   Patient/family response: Family allowed to vent and questions answered, until son became inappropriate in his responses, which he started to personally attack staff members. Conference was stopped when this occurred.  Follow-up/action plans: Requested Palliative Care consult for goals of care and include pt for her wishes and decision. Daughter voiced the frustration on all of their part. They will need to discuss together discharge disposition home verus [facility]. Will get back with worker regarding decision.

## 2017-10-14 NOTE — Progress Notes (Signed)
Hypoglycemic Event  CBG:59  Treatment: 15 GM carbohydrate snack ( osmolite 1.5)  Symptoms: None  Follow-up CBG: Time:2219 CBG Result:117  Possible Reasons for Event: Inadequate meal intake  Comments/MD notified:will monitor    Kelsey Edman, [employer]

## 2017-10-14 NOTE — Progress Notes (Signed)
Modified Barium Swallow Progress Note  Patient Details  Name: SHANDIIN EISENBEIS MRN: [id number] Date of Birth: 1931-11-29  Today's Date: 10/14/2017  Modified Barium Swallow completed.  Full report located under Chart Review in the Imaging Section.  Brief recommendations include the following:  Clinical Impression    Pt presents with modest improvements in swallowing function in comparison to previous MBS.  Most notable is the improved latency of pt's oral phase when presented with a bolus.  Pt was able to accept, contain, and transit boluses; however, pt continues to have significant oral motor weakness and coordination deficits which lead to a prolonged and effortful oral phase.  This, in addition to suspected sensory deficits and base of tongue weakness, result in premature spillage of boluses into the oropharynx and delay of swallow response to the pyriforms.  Furthermore, pt had significant vallecular and pyriform sinus residue with honey thick and pureed boluses.  Nectar thick liquids lead to decreased residue but resulted in frank, sensed aspiration which pt was unable to clear.  While pt did not have significant aspiration or penetration with residuals of puree or honey thick liquids on today's study, it took her at least 3-4 swallows to decrease but not clear residuals from the pharynx which increases her risk of aspiration post swallow.  SLP continues to believe that initiation of a PO diet would be laborous and eventually unsafe but recommends ongoing trials of purees and honey thick liquids.  SLP suspects that recovery of swallowing function will be prolonged given the extent of pt's neuro deficits, age, and overall debility; however, pt has demonstrated noticeable functional gains  and participates very well in therapies which are more positive factors impacting her prognosis    Swallow Evaluation Recommendations       SLP Diet Recommendations: NPO(PEG)       Medication Administration:  Via alternative means                        Treyon Wymore, Elmyra Ricks L 10/14/2017,4:45 PM
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
## 2017-10-14 NOTE — Plan of Care (Signed)
  Progressing RH BOWEL ELIMINATION RH STG MANAGE BOWEL WITH ASSISTANCE Description STG Manage Bowel with total Assistance.    10/14/2017 1254 - Progressing by Brita Romp, RN RH BLADDER ELIMINATION RH STG MANAGE BLADDER WITH ASSISTANCE Description STG Manage Bladder With total Assistance    10/14/2017 1254 - Progressing by Brita Romp, RN RH COGNITION-NURSING RH STG ANTICIPATES NEEDS/CALLS FOR ASSIST W/ASSIST/CUES Description STG Anticipates Needs/Calls for Assist With Mod Assistance/Cues.  10/14/2017 1254 - Progressing by Brita Romp, RN RH PAIN MANAGEMENT RH STG PAIN MANAGED AT OR BELOW PT'S PAIN GOAL Description <3 out of 10.   10/14/2017 1254 - Progressing by Brita Romp, RN   Not Progressing RH SKIN INTEGRITY RH STG SKIN FREE OF INFECTION/BREAKDOWN Description No new breakdown with total assist/cues.     10/14/2017 1254 - Not Progressing by Brita Romp, RN RH STG MAINTAIN SKIN INTEGRITY WITH ASSISTANCE Description STG Maintain Skin Integrity With total Assistance.   10/14/2017 1254 - Not Progressing by Brita Romp, RN

## 2017-10-14 NOTE — Progress Notes (Signed)
Occupational Therapy Session Note  Patient Details  Name: Kayla Kent MRN: [id number] Date of Birth: [birth year]  Today's Date: 10/14/2017 OT Individual Time: 0800-0900 OT Individual Time Calculation (min): 60 min    Short Term Goals: Week 3:  OT Short Term Goal 1 (Week 3): Continue working on established LTGs set at supervision to modified independent level.  Skilled Therapeutic Interventions/Progress Updates:    Pt completed bathing and dressing sit to stand at the sink.  She was able to complete UB bathing with min assist but needed mod hand over hand for holding the washcloth to wash her left arm using the RUE.  Min assist for LB bathing with use of a LH sponge for washing the left foot.  She was able to complete donning pullover shirt with min assist but still demonstrates decreased ability to coordinate and sequence donning the right shirt sleeve over the right arm.  Worked on donning the LLE in the brief and pants first secondary to pt exhibiting decreased AROM in the hip.  Mod demonstrational cueing with min assist to thread pants and then stand to pull them up over her hips.   She was able to donn the left shoe with setup but needed mod assist for donning the right.  Finished session with grooming tasks of applying make-up, brushing hair, and brushing teeth with overall min assist.  Pt placed in wheelchair at bedside with soft touch call button and phone in reach.    Therapy Documentation Precautions:  Precautions Precautions: Fall Precaution Comments: R hemiplegia, expressive aphasia Restrictions Weight Bearing Restrictions: No  Pain: Pain Assessment Pain Assessment: No/denies pain ADL: See Function Navigator for Current Functional Status.   Therapy/Group: Individual Therapy  Keshun Berrett OTR/L 10/14/2017, 12:29 PM

## 2017-10-14 NOTE — Progress Notes (Signed)
Physical Therapy Session Note  Patient Details  Name: Kayla Kent MRN: [id number] Date of Birth: 1932-06-09  Today's Date: 10/14/2017 PT Individual Time: 1330-1435 PT Individual Time Calculation (min): 65 min   Short Term Goals: Week 2:  PT Short Term Goal 1 (Week 2): Pt will maintain midline in static standing 75% of the time PT Short Term Goal 2 (Week 2): Pt will consistently transfer with min assist PT Short Term Goal 3 (Week 2): Pt will maintain midline during dynamic standing balance activities 50% of the time  Skilled Therapeutic Interventions/Progress Updates:   Pt supine upon arrival and agreeable to therapy, no c/o pain. Transferred to EOB w/ supervision and PT provided total assist to don shoes. Ambulated to therapy gym w/ RW and min assist, 100'. Worked on gait training first half of session. Practiced ambulating 100-150' w/o AD both w/ unilateral HHA and w/o HHA. Both required min assist overall for balance. Verbal and visual cues to increase R step length and increase R foot clearance, decreased R step length and foot clearance w/ fatigue. Pt demonstrated occasional stepping strategies to maintain balance independently, however required min assist to correct multiple LOB episodes during gait. Worked on [employer] 2nd half of session while emphasizing lateral weight-shifting during functional activities such as UE reaching and 3" step-ups. Mod assist to correct LOB w/ stepping, no UE support during these tasks however. Performed Berg Balance Scale as well, scored 26/56. Ambulated back to room w/ min assist w/o AD and ended session in supine, call bell within reach and all needs met. Pt encouraged by progress w/ gait in therapy today.   Therapy Documentation Precautions:  Precautions Precautions: Fall Precaution Comments: R hemiplegia, expressive aphasia Restrictions Weight Bearing Restrictions: No Vital Signs: Therapy Vitals Temp: 98 F (36.7 C) Temp Source: Oral Pulse Rate:  94 Resp: 16 BP: (!) 156/55 Patient Position (if appropriate): Lying Oxygen Therapy SpO2: 100 % O2 Device: Not Delivered Pain: Pain Assessment Pain Assessment: No/denies pain  See Function Navigator for Current Functional Status.   Therapy/Group: Individual Therapy  [name] 10/14/2017, 6:14 PM

## 2017-10-14 NOTE — Progress Notes (Signed)
Consult received. Chart reviewed and staffed with bedside RN. Attempted to reach daughter, [name] at [phone]. No answer or means to leave a message. Will continue to try and arrange [place] conversation with family. Thank you, Romona Curls, ANP

## 2017-10-15 ENCOUNTER — Inpatient Hospital Stay (HOSPITAL_COMMUNITY): Payer: Self-pay | Admitting: Physical Therapy

## 2017-10-15 LAB — GLUCOSE, CAPILLARY
GLUCOSE-CAPILLARY: 97 mg/dL (ref 65–99)
GLUCOSE-CAPILLARY: 135 mg/dL — AB (ref 65–99)
GLUCOSE-CAPILLARY: 131 mg/dL — AB (ref 65–99)
Glucose-Capillary: 86 mg/dL (ref 65–99)
Glucose-Capillary: 63 mg/dL — ABNORMAL LOW (ref 65–99)
Glucose-Capillary: 53 mg/dL — ABNORMAL LOW (ref 65–99)

## 2017-10-15 NOTE — Progress Notes (Signed)
Pt was given night time bolus feed for Blood glucose of 58, repeat Blood glucose after feed was 131. Pt was asymptomatic throughout. Remains resting in bed.

## 2017-10-15 NOTE — Progress Notes (Signed)
Daughter [name] visiting with pt this afternoon. Daughter reported to staff that pt c/o of SOB. Pt assessed, no s/s of respiratory distress, vital signs taken. Apical HR 76 and irregular. Pt with hx of Afib. Pt resting comfortably after vital signs taken and reported to this nurse that she felt better. Daughter concerned that pt was aspirating after recent bolus feed. Daughter educated on s/s of aspiration and respiratory distress and episodes of Afib. Daughter verbalized understanding.

## 2017-10-15 NOTE — Plan of Care (Signed)
  Not Progressing RH SKIN INTEGRITY RH STG SKIN FREE OF INFECTION/BREAKDOWN Description No new breakdown with total assist/cues.     10/15/2017 1020 - Not Progressing by Brita Romp, RN RH STG MAINTAIN SKIN INTEGRITY WITH ASSISTANCE Description STG Maintain Skin Integrity With total Assistance.   10/15/2017 1020 - Not Progressing by Brita Romp, RN   Wound to sacrum recently deteriorated from Stage 2 to unstageable. Wound continues to be unstageable. Santyl being applied.

## 2017-10-15 NOTE — Progress Notes (Signed)
Reached daughter, , [name], and appt scheduled for initial consult 12/30 at 0930. Thank you, Romona Curls, ANP

## 2017-10-15 NOTE — Progress Notes (Signed)
[facility] PHYSICAL MEDICINE & REHABILITATION     PROGRESS NOTE  Subjective/Complaints:  Patient seen this morning.  She is lying in bed and sleeping.  She is easily awakened.  She denies any pains.  She denies any concerns.  Objective: Vital Signs: Blood pressure 136/79, pulse 65, temperature 98.3 F (36.8 C), temperature source Oral, resp. rate 16, height 5\' 7"  (1.702 m), weight 93 lb 11.1 oz (42.5 kg), SpO2 95 %.  Elderly female in no acute distress.  She is sleeping but easily awakened.  She is alert.  No acute distress.  Neck is thin and supple.  Chest is clear to auscultation without any increased work of breathing.  Cardiac exam S1 and S2 are irregularly irregular.  Abdominal exam thin, active bowel sounds, soft, Nontender.  PEG tube is in place.  Extremities without edema. Assessment/Plan: 1. Functional deficits secondary to left MCA infarct which require   Medical Problem List and Plan: 1.  Hemiparesis, aphasia, dysphagia secondary to Left MCA infarct.   Continue CIR    2.  DVT Prophylaxis/Anticoagulation: Pharmaceutical: Other (comment)--Eliquis (atrial fibrillation). 3. Pain Management: Tylenol prn  4. Mood: LCSW to follow for evaluation and support.    Lexapro started on 12/27 5. Neuropsych: This patient is not fully capable of making decisions on her own behalf. 6. Skin/Wound Care: Pressure relief measures.Maintain adequate hydration/nutritional status.  7. Fluids/Electrolytes/Nutrition:  Basic Metabolic Panel:    Component Value Date/Time   NA 135 10/10/2017 0615   NA 144 03/18/2017 1200   K 4.5 10/10/2017 0615   CL 98 (L) 10/10/2017 0615   CO2 27 10/10/2017 0615   BUN 25 (H) 10/10/2017 0615   BUN 19.8 08/23/2017 1504   CREATININE 0.67 10/10/2017 0615   CREATININE 1.3 (H) 08/23/2017 1504   GLUCOSE 123 (H) 10/10/2017 0615   GLUCOSE 107 (H) 10/25/2006 0906   CALCIUM 8.7 (L) 10/10/2017 0615   8. RUL/RLL aspiration PNA   Has completed antibiotics.   Repeat chest  x-ray reviewed, showing stable abnormality.    9. ?CKD stage III: Monitor renal status with routine checks. Water flushes via PEG.    Creatinine 0.67 on 12/24    10 Recent GIB: Back on Eliquis.    Hemoglobin 10.9 on 12/24   Labs ordered for December 31.   Continue to monitor 11. Seizure disorder: stable/controlled on Keppra. 12. A Fib: Patient is on Eliquis.  Rate is controlled while in bed. 13. Thrombocytosis: Resolved   Likely reactive.    Will continue to monitor.  14. Dysphagia: NPO (holding bite trials with ST), tolerating tube feeds 15. Protein calorie malnutrition: Chronic with baseline albumin around 2.5 in the past 6 months.    Pre-albumin 16.9 on 12/13.    Continue to monitor 16. COPD (per X ray)/RUL bronchogenic lung CA: completed XRT 06/2017 17. Hypokalemia: Resolved 18. Leukocytosis: Resolved 19. Hyperglycemia   Secondary to tube feeds    CBG (last 3)  Recent Labs    10/14/17 2045 10/14/17 2219 10/15/17 0638  GLUCAP 59* 117* 86    20. Urinary frequency  Continue current medications for now.  Palliative care consult is pending.  It appears that an appointment has been made with the family.  LOS (Days) 17 A FACE TO FACE EVALUATION WAS PERFORMED  Kayla Kent H Kayla Kent 10/15/2017 8:21 AM

## 2017-10-15 NOTE — Plan of Care (Signed)
Pt incontinent of urine  Denies any pain Continue with treatment for pressure wound

## 2017-10-15 NOTE — Progress Notes (Signed)
Hypoglycemic Event  CBG: 63  Treatment: Gave scheduled tube feeding dose- Osmolite 1.5 285cc  Symptoms: None  Follow-up CBG: Time:1731 CBG Result:135  Possible Reasons for Event: Unknown  Comments/MD notified:Patient given scheduled tube feed     Izaya Netherton, Warnell Bureau

## 2017-10-15 NOTE — Progress Notes (Signed)
Physical Therapy Session Note  Patient Details  Name: Kayla Kent MRN: [id number] Date of Birth: 1932-08-18  Today's Date: 10/15/2017 PT Individual Time: 1525-1610 PT Individual Time Calculation (min): 45 min  PT Missed time: 30 min (unwilling to participate, fatigue)   Short Term Goals: Week 2:  PT Short Term Goal 1 (Week 2): Pt will maintain midline in static standing 75% of the time PT Short Term Goal 2 (Week 2): Pt will consistently transfer with min assist PT Short Term Goal 3 (Week 2): Pt will maintain midline during dynamic standing balance activities 50% of the time  Skilled Therapeutic Interventions/Progress Updates:   Pt supine upon arrival and agreeable to therapy, no c/o pain. Ambulated to/from therapy gym w/ min assist via R HHA, ~125' each way. Worked on static and dynamic standing balance this session while maintaining midline and increasing time that she is able to maintain midline before fatigue increases L pushing. Activities included cognitive tasks requiring reaching across table and above head. Pt able to maintain static/dynamic standing balance for 3-4 min before needing to sit 2/2 increased L pushing w/ fatigue causing LOB. Min assist to correct LOB to R w/ standing. Verbal and tactile cues to engage RLE musculature in stance to prevent R lateral lean. Pt reporting increased fatigue halfway through session, refused to participate further and asked to return to bed. Questioned why pt was so tired, reports she didn't sleep last night. Offered to perform other sitting tasks, pt continued to refuse. Ambulated back to room and pt requesting to toilet and ambulated to/from toilet w/ min assist via HHA. Total assist to manage LE garments while pt performed sit<>stands w/ close supervision using handrails in bathroom. Returned to EOB and to supine. Ended session in supine, call bell within reach and all needs met.   Therapy Documentation Precautions:  Precautions Precautions:  Fall Precaution Comments: R hemiplegia, expressive aphasia Restrictions Weight Bearing Restrictions: No Vital Signs: Therapy Vitals Temp: (!) 97.5 F (36.4 C) Temp Source: Oral Pulse Rate: 77 Resp: 16 BP: (!) 129/49 Patient Position (if appropriate): Lying Oxygen Therapy SpO2: 96 % O2 Device: Not Delivered  See Function Navigator for Current Functional Status.   Therapy/Group: Individual Therapy  Jefte Carithers K Arnette 10/15/2017, 4:25 PM

## 2017-10-16 ENCOUNTER — Inpatient Hospital Stay (HOSPITAL_COMMUNITY): Payer: Self-pay | Admitting: Occupational Therapy

## 2017-10-16 DIAGNOSIS — Z515 Encounter for palliative care: Secondary | ICD-10-CM | POA: Insufficient documentation

## 2017-10-16 LAB — GLUCOSE, CAPILLARY
GLUCOSE-CAPILLARY: 91 mg/dL (ref 65–99)
GLUCOSE-CAPILLARY: 98 mg/dL (ref 65–99)
Glucose-Capillary: 132 mg/dL — ABNORMAL HIGH (ref 65–99)
Glucose-Capillary: 65 mg/dL (ref 65–99)

## 2017-10-16 NOTE — Progress Notes (Signed)
[facility] [place] [facility] PHYSICAL MEDICINE & REHABILITATION     PROGRESS NOTE  Subjective/Complaints:  Patient seen this morning.  She is lying in bed.  She is alert.  She denies any complaints.  Objective: Vital Signs: Blood pressure (!) 141/36, pulse 81, temperature 97.8 F (36.6 C), temperature source Oral, resp. rate 17, height 5\' 7"  (1.702 m), weight 90 lb 13.3 oz (41.2 kg), SpO2 95 %.  Elderly female in no acute distress.  She is lying in bed.  She is alert.  No acute distress.  HEENT exam: Atraumatic, normocephalic.  Neck is supple.  Chest is clear to auscultation.  Cardiac exam S1 and S2 are irregular.  Abdominal exam thin, active bowel sounds, soft, PEG tube is in place.  Extremities without edema.  Assessment/Plan: 1. Functional deficits secondary to left MCA infarct which require   Medical Problem List and Plan: 1.  Hemiparesis, aphasia, dysphagia secondary to Left MCA infarct.   Continue CIR    2.  DVT Prophylaxis/Anticoagulation: Pharmaceutical: Other (comment)--Eliquis (atrial fibrillation). 3. Pain Management: Tylenol prn  4. Mood: LCSW to follow for evaluation and support.    Lexapro started on 12/27 5. Neuropsych: This patient is not fully capable of making decisions on her own behalf. 6. Skin/Wound Care: Pressure relief measures.Maintain adequate hydration/nutritional status.  7. Fluids/Electrolytes/Nutrition:  Basic Metabolic Panel:    Component Value Date/Time   NA 135 10/10/2017 0615   NA 144 03/18/2017 1200   K 4.5 10/10/2017 0615   CL 98 (L) 10/10/2017 0615   CO2 27 10/10/2017 0615   BUN 25 (H) 10/10/2017 0615   BUN 19.8 08/23/2017 1504   CREATININE 0.67 10/10/2017 0615   CREATININE 1.3 (H) 08/23/2017 1504   GLUCOSE 123 (H) 10/10/2017 0615   GLUCOSE 107 (H) 10/25/2006 0906   CALCIUM 8.7 (L) 10/10/2017 0615   8. RUL/RLL aspiration PNA   Has completed antibiotics.   Repeat chest x-ray reviewed, showing stable abnormality.    9. ?CKD stage III: Monitor renal  status with routine checks. Water flushes via PEG.    Lab Results  Component Value Date   CREATININE 0.67 10/10/2017     10 Recent GIB: Back on Eliquis.     Lab Results  Component Value Date   HGB 10.9 (L) 10/10/2017   Continue to monitor 11. Seizure disorder: stable/controlled on Keppra. 12. A Fib: Patient is on Eliquis.  Rate is controlled while in bed. 13. Thrombocytosis: Resolved   Likely reactive.    Will continue to monitor.  14. Dysphagia: NPO (holding bite trials with ST), tolerating tube feeds 15. Protein calorie malnutrition: Chronic with baseline albumin around 2.5 in the past 6 months.    Pre-albumin 16.9 on 12/13.    Continue to monitor 16. COPD (per X ray)/RUL bronchogenic lung CA: completed XRT 06/2017 17. Hypokalemia: Resolved 18. Leukocytosis: Resolved 19. Hyperglycemia   Secondary to tube feeds    CBG (last 3)  Recent Labs    10/15/17 1731 10/15/17 2108 10/15/17 2137  GLUCAP 135* 53* 131*  She is on a sliding scale of insulin.  Note occasional low blood sugar.  Will continue current regimen but if hypoglycemic episodes continue may need to decrease insulin therapy. 20. Urinary frequency  Continue current medications for now.  Palliative care consult is pending.  It appears that an appointment has been made with the family.  LOS (Days) 18 A FACE TO FACE EVALUATION WAS PERFORMED  Bruce H Swords 10/16/2017 6:46 AM

## 2017-10-16 NOTE — Consult Note (Signed)
Consultation Note Date: 10/16/2017   Patient Name: Kayla Kent  DOB: Jan 12, 1932  MRN: [id number]  Age / Sex: 83 y.o., female  PCP: Biagio Borg, MD Referring Physician: Jamse Arn, MD  Reason for Consultation: Establishing goals of care and Psychosocial/spiritual support  HPI/Patient Profile: 83 y.o. female  with past medical history of atrial fib, dysphasia, recurrent urinary tract infections admitted on 09/28/2017 to rehab services after sustaining left MCA infarct  Family requested goals of care conference  Clinical Assessment and Goals of Care: Met with patient, patient's daughter and son-in-law.  Chart reviewed.  As noted, family requested goals of care conference.  At this juncture patient has a feeding tube and is undergoing rehab, status Kent left MCA stroke.  Family has struggled with placing a feeding tube since this was noted in patient's living will as something she would not want to do.  This is one of the family's concerns and our conversation today is to ascertain how she is feeling about her overall health, living with a feeding tube at this point, and potentially living in a facility in the future.  Also addressed, what patient may encounter going forward in the setting of Kent stroke.  Patient articulated that she is coping well with a feeding tube and wishes to continue.  She also verbalizes that her chief concern is being "a burden on her family".  To that end, she is receptive to discharge to a skilled nursing facility for ongoing rehabilitation.  Patient has been working very hard with the rehab team and is making progress.  She seems to recognize that she will not completely return to her independent prior baseline function which understandably makes her sad.  We also addressed at length some of the complications that people encounter in the setting of weakness, debility such as  recurrent urinary tract infections (of which patient is  predisposed to), aspiration pneumonia.  Patient was able to share with her daughter and son-in-law that were she to acquire another urinary tract infection that required hospitalization, at this point she would want to do that.  I did share the MOST form with patient and family as well as [employer] for [employer] booklet as an ongoing resource  Patient at this point can speak for herself.  In the event that she would be unable to, her daughter Kayla Kent is her healthcare proxy.  Patient is widowed and has 2 children.  Her son Kayla Kent lives in [place] and is an active part of her care    SUMMARY OF RECOMMENDATIONS   Continue partial code in that patient would agree to BiPAP but no CPR no defibrillation no mechanical ventilation Recommend community palliative care follow patient and support family in skilled nursing facility upon discharge Code Status/Advance Care Planning:  Limited code    Symptom Management:   Pain: Patient currently denying pain.  Continue with as needed Tylenol for mild pain  Insomnia: Continue with melatonin  Palliative Prophylaxis:   Aspiration, Bowel Regimen, Delirium Protocol, Eye  Care, Oral Care and Turn Reposition    Psycho-social/Spiritual:   Desire for further Chaplaincy support:no  Additional Recommendations: Referral to Community Resources   Prognosis:   Unable to determine  Discharge Planning: [place] for rehab with Palliative care service follow-up      Primary Diagnoses: Present on Admission: . Cerebral thrombosis with cerebral infarction . Acute ischemic left MCA stroke ([place])   I have reviewed the medical record, interviewed the patient and family, and examined the patient. The following aspects are pertinent.  Past Medical History:  Diagnosis Date  . Allergic rhinitis   . Anxiety   . CKD (chronic kidney disease) stage 3, GFR 30-59 ml/min (HCC)     . Emphysema of lung ([place])   . HLD (hyperlipidemia)   . HTN (hypertension)   . Intolerance of drug    orthostatic  . Paroxysmal supraventricular tachycardia ([place])   . PVD (peripheral vascular disease) ([place])   . Seizure ([place])   . Syncope and collapse   . Uterine prolapse without mention of vaginal wall prolapse    Social History   Socioeconomic History  . Marital status: Widowed    Spouse name: None  . Number of children: 2  . Years of education: 72  . Highest education level: None  Social Needs  . Financial resource strain: None  . Food insecurity - worry: None  . Food insecurity - inability: None  . Transportation needs - medical: None  . Transportation needs - non-medical: None  Occupational History  . Occupation: retired Radio producer  Tobacco Use  . Smoking status: Former Smoker    Packs/day: 0.25    Years: 38.00    Pack years: 9.50    Start date: 02/15/1952    Last attempt to quit: 10/18/1993    Years since quitting: 24.0  . Smokeless tobacco: Never Used  Substance and Sexual Activity  . Alcohol use: No    Alcohol/week: 0.0 oz  . Drug use: No  . Sexual activity: No  Other Topics Concern  . None  Social History Narrative   [employer], [employer]-BA, [employer]-early childhood. Married '55 -55 years.  1 son - 52; 1 daughter - 58; 3 grandchildren . Lives alone. ACP - discussed and provided packet on end of life care (Feb '13)   Patient is now widowed.   Patient is right-handed.   Patient drinks tea daily.      [facility] (04/26/17):   Originally from [facility]. Previously was a Pharmacist, hospital. No pets currently. No mold exposure. No bird exposure.    Family History  Problem Relation Age of Onset  . Mental illness Father        suicide  . Arthritis Father   . Hyperlipidemia Sister   . Hypertension Sister   . Heart disease Brother        CAD/MI  . Hypertension Brother   . Hypertension Unknown        family hx  . Colon cancer Neg Hx   . Breast cancer Neg Hx   .  Diabetes Neg Hx   . Stroke Neg Hx   . Cancer Neg Hx   . Lung disease Neg Hx    Scheduled Meds: . apixaban  2.5 mg Per Tube BID  . aspirin  81 mg Per Tube Daily  . atorvastatin  20 mg Per Tube q1800  . bethanechol  10 mg Oral TID  . chlorhexidine  15 mL Mouth Rinse BID  . collagenase   Topical  Daily  . escitalopram  5 mg Oral Daily  . feeding supplement (OSMOLITE 1.5 CAL)  285 mL Per Tube QID  . free water  200 mL Per Tube Q8H  . Gerhardt's butt cream   Topical QID  . guaiFENesin-dextromethorphan  10 mL Per Tube TID  . insulin aspart  0-9 Units Subcutaneous TID WC  . mouth rinse  15 mL Mouth Rinse q12n4p  . Melatonin  1.5 mg Oral QHS  . metoprolol tartrate  25 mg Per Tube BID  . pantoprazole sodium  40 mg Per Tube BID  . trimethoprim-polymyxin b  1 drop Right Eye QID   Continuous Infusions: PRN Meds:.acetaminophen (TYLENOL) oral liquid 160 mg/5 mL, alum & mag hydroxide-simeth, bisacodyl, ipratropium-albuterol, lidocaine, simethicone, sodium chloride, sodium phosphate Medications Prior to Admission:  Prior to Admission medications   Medication Sig Start Date End Date Taking? Authorizing Provider  acetaminophen (TYLENOL) 325 MG tablet Take 2 tablets (650 mg total) by mouth every 4 (four) hours as needed for mild pain (or temp > 37.5 C (99.5 F)). 09/16/17  Yes Erick Colace, NP  aspirin 300 MG suppository Place 1 suppository (300 mg total) rectally daily. 09/17/17  Yes Erick Colace, NP  atorvastatin (LIPITOR) 20 MG tablet Take 1 tablet (20 mg total) by mouth daily at 6 PM. 09/16/17  Yes Erick Colace, NP  bisacodyl (DULCOLAX) 10 MG suppository Place 1 suppository (10 mg total) rectally daily as needed for moderate constipation. 09/16/17  Yes Erick Colace, NP  chlorhexidine gluconate, MEDLINE KIT, (PERIDEX) 0.12 % solution 15 mLs by Mouth Rinse route 2 (two) times daily. 09/16/17  Yes Erick Colace, NP  heparin 100-0.45 UNIT/ML-% infusion Inject 900 Units/hr into the vein  continuous. 09/16/17  Yes Erick Colace, NP  ipratropium-albuterol (DUONEB) 0.5-2.5 (3) MG/3ML SOLN Take 3 mLs by nebulization every 4 (four) hours as needed. 09/16/17  Yes Erick Colace, NP  levETIRAcetam (KEPPRA) 100 MG/ML solution Take 2.5 mLs (250 mg total) by mouth 2 (two) times daily. 09/16/17  Yes Erick Colace, NP  Nutritional Supplements (FEEDING SUPPLEMENT, OSMOLITE 1.2 CAL,) LIQD Place 1,000 mLs into feeding tube continuous. 09/16/17  Yes Erick Colace, NP  pantoprazole sodium (PROTONIX) 40 mg/20 mL PACK Place 20 mLs (40 mg total) into feeding tube daily at 12 noon. 09/17/17  Yes Erick Colace, NP  senna-docusate (SENOKOT-S) 8.6-50 MG tablet 1 tablet by Per NG tube route at bedtime. 09/16/17  Yes Erick Colace, NP  sodium chloride (OCEAN) 0.65 % SOLN nasal spray Place 1 spray into both nostrils as needed for congestion. 09/16/17  Yes Erick Colace, NP  sodium chloride 0.9 % infusion Inject 250 mLs into the vein as needed (if IV carrier fluid needed.). 09/16/17  Yes Erick Colace, NP   Allergies  Allergen Reactions  . Chocolate Hives  . Codeine Other (See Comments)    Patient states she acts crazy  . Diazepam Other (See Comments)    Patient states she acts crazy.  . Fruit & Vegetable Daily [Nutritional Supplements] Hives and Swelling    peaches  . Iohexol Hives     Code: HIVES, Desc: pt gets 13 hr pre-meds, Onset Date: 16109604   . Latex Hives  . Peach [Prunus Persica] Hives  . Peanut-Containing Drug Products Hives and Swelling  . Penicillins Hives, Itching and Swelling    Has patient had a PCN reaction causing immediate rash, facial/tongue/throat swelling, SOB or lightheadedness with hypotension: Yes Has  patient had a PCN reaction causing severe rash involving mucus membranes or skin necrosis: No Has patient had a PCN reaction that required hospitalization No Has patient had a PCN reaction occurring within the last 10 years: No If all of the above  answers are "NO", then may proceed with Cephalosporin use.   . Strawberry Extract Hives  . Sulfonamide Derivatives Hives    nausea  . Wheat     Daughter states this is an error, that she is not allergic to wheat products  . Wheat Bran     Patient family states this is an error- she is not allergic to wheat products  . Sulfamethoxazole Hives  . Crestor [Rosuvastatin Calcium] Rash  . Iodine Rash   Review of Systems  Unable to perform ROS: Other    Physical Exam  Constitutional: She is oriented to person, place, and time.  Frail elderly female; alert and in no acute distress  HENT:  Head: Normocephalic and atraumatic.  Neck: Normal range of motion.  Pulmonary/Chest: Effort normal.  Neurological: She is alert and oriented to person, place, and time.  Patient speech is dysarthric; right-sided weakness  Skin: Skin is warm and dry.  Psychiatric: She has a normal mood and affect. Her behavior is normal. Thought content normal.  Nursing note and vitals reviewed.   Vital Signs: BP (!) 178/90   Pulse 88   Temp 97.8 F (36.6 C) (Oral)   Resp 17   Ht 5' 7"  (1.702 m)   Wt 41.2 kg (90 lb 13.3 oz) Comment: standing scale  SpO2 95%   BMI 14.23 kg/m  Pain Assessment: No/denies pain   Pain Score: 0-No pain   SpO2: SpO2: 95 % O2 Device:SpO2: 95 % O2 Flow Rate: .   IO: Intake/output summary: No intake or output data in the 24 hours ending 10/16/17 1348  LBM: Last BM Date: 10/14/17 Baseline Weight: Weight: 44.5 kg (98 lb 1.6 oz) Most recent weight: Weight: 41.2 kg (90 lb 13.3 oz)(standing scale)     Palliative Assessment/Data:   Flowsheet Rows     Most Recent Value  Intake Tab  Referral Department  Hospitalist  Unit at Time of Referral  Other (Comment)  Palliative Care Primary Diagnosis  Neurology  Date Notified  10/14/17  Reason for referral  Clarify Goals of Care  Date of Admission  09/28/17  Date first seen by Palliative Care  10/16/17  # of days Palliative referral  response time  2 Day(s)  # of days IP prior to Palliative referral  16  Clinical Assessment  Palliative Performance Scale Score  40%  Pain Max last 24 hours  Not able to report  Pain Min Last 24 hours  Not able to report  Dyspnea Max Last 24 Hours  Not able to report  Dyspnea Min Last 24 hours  Not able to report  Nausea Max Last 24 Hours  Not able to report  Nausea Min Last 24 Hours  Not able to report  Anxiety Max Last 24 Hours  Not able to report  Anxiety Min Last 24 Hours  Not able to report  Other Max Last 24 Hours  Not able to report  Psychosocial & Spiritual Assessment  Palliative Care Outcomes  Patient/Family meeting held?  Yes  Who was at the meeting?  pt, pt's dtr and SIL  Palliative Care follow-up planned  No      Time In: 0930 Time Out: 1045 Time Total: 65mn Greater than 50%  of this  time was spent counseling and coordinating care related to the above assessment and plan.  Signed by: Dory Horn, NP   Please contact Palliative Medicine Team phone at [phone] for questions and concerns.  For individual provider: See Shea Evans

## 2017-10-16 NOTE — Plan of Care (Signed)
  Not Progressing RH BLADDER ELIMINATION RH STG MANAGE BLADDER WITH ASSISTANCE Description STG Manage Bladder With total Assistance    10/16/2017 1305 - Not Progressing by Brita Romp, RN Pt continues to have incontinent episodes RH SKIN INTEGRITY RH STG MAINTAIN SKIN INTEGRITY WITH ASSISTANCE Description STG Maintain Skin Integrity With total Assistance.   10/16/2017 1305 - Not Progressing by Brita Romp, RN Pt wound on sacrum unstageable, Santyl being used. No improvement noted yet.

## 2017-10-16 NOTE — Progress Notes (Signed)
Occupational Therapy Session Note  Patient Details  Name: Kayla Kent MRN: [id number] Date of Birth: [birth year]  Today's Date: 10/16/2017 OT Individual Time: 0802-0940 OT Individual Time Calculation (min): 98 min    Short Term Goals: Week 3:  OT Short Term Goal 1 (Week 3): Continue working on established LTGs set at supervision to modified independent level.  Skilled Therapeutic Interventions/Progress Updates:    Pt completed ADL tasks to start session.  Min assist for transfer from the supine to EOB with min assist and mod demonstrational cueing for hand placement on the right side for sit to stand.  Pt with excellent push through the elbow and shoulder with sit to stand but needs assist with positioning of right hand and fingers.  She ambulated to the toilet for attempted toileting per request, but had already went in her brief and could not void any further.  Min assist for mobility to the toilet as well as to the shower.  She sat on the shower seat for all bathing with max demonstrational cueing to sequence washing hair and the rest of her body, including use of washcloth, secondary to motor planning deficits.  Min assist overall for bathing with the higher level of cueing.  She utilized LH sponge with assist for setup to wash her lower legs and feet.  Had pt transfer to the wheelchair at min assist level for dressing and grooming tasks.  Min assist for donning pullover, with pt demonstrating difficulty threading the right arm.  She needed mod assist for threading pants over her feet and continues to exhibit difficulty dressing the LLE more than the right with this secondary to hip tightness and pain.  Attempts to integrate reacher are difficult at this time secondary to motor planning of new item.  She was able to donn her right sock this session with setup and one handed technique as well as shoes with elastic laces.  Min assist for brushing teeth after setup of brush, but she combed her hair  with supervision.  Mod assist needed for application of makeup secondary to not putting it on the right side of her face and putting too much on the left.  Once ADL was finished, had pt ambulate to the therapy gym with min assist and use of the RW for support.  Mod demonstrational cueing for correct procedure to transition in front of surface to sit and to avoid backing in from a long distance away.  Applied NMES to the digit flexors and extensors on custom setting.  Intensity at 20 for both with PPS at 35 and pulse width at 300.  Pt tolerated 5 mins and then session was stopped secondary to scheduled palliative care meeting.  Pt ambulated back to the room and was left with family in the wheelchair.  Per there choice safety belt was not used as they would be supervision patient.  Therapist placed note over bed requesting pt wear resting hand splint at night for positioning on the RUE, and for nursing to assist with this.  Discussed with Threasa Beards, her current nurse as well so that this can be communicated to the night RN.    Therapy Documentation Precautions:  Precautions Precautions: Fall Precaution Comments: R hemiplegia, expressive aphasia Restrictions Weight Bearing Restrictions: No  Pain: Pain Assessment Pain Assessment: No/denies pain ADL: See Function Navigator for Current Functional Status.   Therapy/Group: Individual Therapy  Lennix Kneisel OTR/L 10/16/2017, 9:48 AM

## 2017-10-17 ENCOUNTER — Inpatient Hospital Stay (HOSPITAL_COMMUNITY): Payer: Medicare Other | Admitting: Occupational Therapy

## 2017-10-17 ENCOUNTER — Inpatient Hospital Stay (HOSPITAL_COMMUNITY): Payer: Self-pay

## 2017-10-17 ENCOUNTER — Inpatient Hospital Stay (HOSPITAL_COMMUNITY): Payer: Medicare Other | Admitting: Speech Pathology

## 2017-10-17 ENCOUNTER — Inpatient Hospital Stay (HOSPITAL_COMMUNITY): Payer: Self-pay | Admitting: Occupational Therapy

## 2017-10-17 DIAGNOSIS — K922 Gastrointestinal hemorrhage, unspecified: Secondary | ICD-10-CM

## 2017-10-17 DIAGNOSIS — R030 Elevated blood-pressure reading, without diagnosis of hypertension: Secondary | ICD-10-CM

## 2017-10-17 DIAGNOSIS — I480 Paroxysmal atrial fibrillation: Secondary | ICD-10-CM

## 2017-10-17 LAB — CBC WITH DIFFERENTIAL/PLATELET
Basophils Absolute: 0 10*3/uL (ref 0.0–0.1)
Basophils Relative: 0 %
EOS ABS: 0 10*3/uL (ref 0.0–0.7)
EOS PCT: 0 %
HCT: 38.3 % (ref 36.0–46.0)
Hemoglobin: 11.8 g/dL — ABNORMAL LOW (ref 12.0–15.0)
LYMPHS ABS: 1.4 10*3/uL (ref 0.7–4.0)
LYMPHS PCT: 12 %
MCH: 30.2 pg (ref 26.0–34.0)
MCHC: 30.8 g/dL (ref 30.0–36.0)
MCV: 98 fL (ref 78.0–100.0)
MONOS PCT: 4 %
Monocytes Absolute: 0.5 10*3/uL (ref 0.1–1.0)
Neutro Abs: 9.6 10*3/uL — ABNORMAL HIGH (ref 1.7–7.7)
Neutrophils Relative %: 84 %
PLATELETS: 342 10*3/uL (ref 150–400)
RBC: 3.91 MIL/uL (ref 3.87–5.11)
RDW: 15.4 % (ref 11.5–15.5)
WBC: 11.5 10*3/uL — ABNORMAL HIGH (ref 4.0–10.5)

## 2017-10-17 LAB — BASIC METABOLIC PANEL
Anion gap: 9 (ref 5–15)
BUN: 21 mg/dL — AB (ref 6–20)
CHLORIDE: 95 mmol/L — AB (ref 101–111)
CO2: 26 mmol/L (ref 22–32)
CREATININE: 0.81 mg/dL (ref 0.44–1.00)
Calcium: 9 mg/dL (ref 8.9–10.3)
GFR calc Af Amer: >60 mL/min (ref >60)
GFR calc non Af Amer: >60 mL/min (ref >60)
GLUCOSE: 168 mg/dL — AB (ref 65–99)
POTASSIUM: 3.9 mmol/L (ref 3.5–5.1)
Sodium: 130 mmol/L — ABNORMAL LOW (ref 135–145)

## 2017-10-17 LAB — GLUCOSE, CAPILLARY: Glucose-Capillary: 78 mg/dL (ref 65–99)

## 2017-10-17 MED ORDER — OSMOLITE 1.5 CAL PO LIQD
220.0000 mL | Freq: Every day | ORAL | Status: DC
  Administered 2017-10-17 – 2017-10-24 (×32): 220 mL
  Filled 2017-10-17 (×43): qty 237

## 2017-10-17 MED ORDER — ONDANSETRON HCL 4 MG PO TABS
4.0000 mg | ORAL_TABLET | Freq: Four times a day (QID) | ORAL | Status: DC | PRN
  Administered 2017-10-17 – 2017-10-24 (×6): 4 mg via ORAL
  Filled 2017-10-17 (×6): qty 1

## 2017-10-17 NOTE — Discharge Instructions (Signed)
Inpatient Rehab Discharge Instructions  PAMILA MENDIBLES Discharge date and time:    Activities/Precautions/ Functional Status: Activity: no lifting, driving, or strenuous exercise for till cleared by  MD Diet:  Wound Care: keep wound clean and dry   Functional status:  ___ No restrictions     ___ Walk up steps independently ___ 24/7 supervision/assistance   ___ Walk up steps with assistance ___ Intermittent supervision/assistance  ___ Bathe/dress independently ___ Walk with walker     ___ Bathe/dress with assistance ___ Walk Independently    ___ Shower independently ___ Walk with assistance    ___ Shower with assistance ___ No alcohol     ___ Return to work/school ________   Special Instructions:   STROKE/TIA DISCHARGE INSTRUCTIONS SMOKING Cigarette smoking nearly doubles your risk of having a stroke & is the single most alterable risk factor  If you smoke or have smoked in the last 12 months, you are advised to quit smoking for your health.  Most of the excess cardiovascular risk related to smoking disappears within a year of stopping.  Ask you doctor about anti-smoking medications  [place] Quit Line: 1-800-QUIT NOW  Free Smoking Cessation Classes [phone]  CHOLESTEROL Know your levels; limit fat & cholesterol in your diet  Lipid Panel     Component Value Date/Time   CHOL 132 10/30/2015 0931   TRIG 99 09/14/2017 2315   TRIG 141 10/25/2006 0906   HDL 33 (L) 10/30/2015 0931   CHOLHDL 4.0 10/30/2015 0931   VLDL 14 10/30/2015 0931   LDLCALC 85 10/30/2015 0931      Many patients benefit from treatment even if their cholesterol is at goal.  Goal: Total Cholesterol (CHOL) less than 160  Goal:  Triglycerides (TRIG) less than 150  Goal:  HDL greater than 40  Goal:  LDL (LDLCALC) less than 100   BLOOD PRESSURE American Stroke Association blood pressure target is less that 120/80 mm/Hg  Your discharge blood pressure is:  BP: (!) 135/43  Monitor your blood  pressure  Limit your salt and alcohol intake  Many individuals will require more than one medication for high blood pressure  DIABETES (A1c is a blood sugar average for last 3 months) Goal HGBA1c is under 7% (HBGA1c is blood sugar average for last 3 months)  Diabetes: No known diagnosis of diabetes    Lab Results  Component Value Date   HGBA1C 5.5 09/13/2017     Your HGBA1c can be lowered with medications, healthy diet, and exercise.  Check your blood sugar as directed by your physician  Call your physician if you experience unexplained or low blood sugars.  PHYSICAL ACTIVITY/REHABILITATION Goal is 30 minutes at least 4 days per week  Activity: Increase activity slowly, and No driving, Therapies: see above Return to work: N/A  Activity decreases your risk of heart attack and stroke and makes your heart stronger.  It helps control your weight and blood pressure; helps you relax and can improve your mood.  Participate in a regular exercise program.  Talk with your doctor about the best form of exercise for you (dancing, walking, swimming, cycling).  DIET/WEIGHT Goal is to maintain a healthy weight  Your discharge diet is: Diet NPO time specified  liquids Your height is:  Height: 5\' 7"  (170.2 cm) Your current weight is: Weight: 43.5 kg (95 lb 14.4 oz) Your Body Mass Index (BMI) is:  BMI (Calculated): 15.02  Following the type of diet specifically designed for you will help prevent another  stroke.  You are below goal weight   Your goal Body Mass Index (BMI) is 19-24.  Healthy food habits can help reduce 3 risk factors for stroke:  High cholesterol, hypertension, and excess weight.  RESOURCES Stroke/Support Group:  Call [phone]   STROKE EDUCATION PROVIDED/REVIEWED AND GIVEN TO PATIENT Stroke warning signs and symptoms How to activate emergency medical system (call 911). Medications prescribed at discharge. Need for follow-up after discharge. Personal risk factors for  stroke. Pneumonia vaccine given:  Flu vaccine given:  My questions have been answered, the writing is legible, and I understand these instructions.  I will adhere to these goals & educational materials that have been provided to me after my discharge from the hospital.     My questions have been answered and I understand these instructions. I will adhere to these goals and the provided educational materials after my discharge from the hospital.  Patient/Caregiver Signature _______________________________ Date __________  Clinician Signature _______________________________________ Date __________  Please bring this form and your medication list with you to all your follow-up doctor's appointments.   Information on my medicine - ELIQUIS (apixaban)  This medication education was reviewed with me or my healthcare representative as part of my discharge preparation.   Why was Eliquis prescribed for you? Eliquis was prescribed for you to reduce the risk of a blood clot forming that can cause a stroke if you have a medical condition called atrial fibrillation (a type of irregular heartbeat).  What do You need to know about Eliquis ? Take your Eliquis TWICE DAILY - one tablet in the morning and one tablet in the evening with or without food. If you have difficulty swallowing the tablet whole please discuss with your pharmacist how to take the medication safely.  Take Eliquis exactly as prescribed by your doctor and DO NOT stop taking Eliquis without talking to the doctor who prescribed the medication.  Stopping may increase your risk of developing a stroke.  Refill your prescription before you run out.  After discharge, you should have regular check-up appointments with your healthcare provider that is prescribing your Eliquis.  In the future your dose may need to be changed if your kidney function or weight changes by a significant amount or as you get older.  What do you do if you miss a  dose? If you miss a dose, take it as soon as you remember on the same day and resume taking twice daily.  Do not take more than one dose of ELIQUIS at the same time to make up a missed dose.  Important Safety Information A possible side effect of Eliquis is bleeding. You should call your healthcare provider right away if you experience any of the following: ? Bleeding from an injury or your nose that does not stop. ? Unusual colored urine (red or dark brown) or unusual colored stools (red or black). ? Unusual bruising for unknown reasons. ? A serious fall or if you hit your head (even if there is no bleeding).  Some medicines may interact with Eliquis and might increase your risk of bleeding or clotting while on Eliquis. To help avoid this, consult your healthcare provider or pharmacist prior to using any new prescription or non-prescription medications, including herbals, vitamins, non-steroidal anti-inflammatory drugs (NSAIDs) and supplements.  This website has more information on Eliquis (apixaban): http://[email or web address].com/eliquis/home

## 2017-10-17 NOTE — Progress Notes (Signed)
Physical Therapy Session Note  Patient Details  Name: Kayla Kent MRN: [id number] Date of Birth: [birth year]  Today's Date: 10/17/2017 PT Individual Time: 1100-1110 and 1525-1605 PT Individual Time Calculation (min): 55min and 40 min   Short Term Goals: Week 2:  PT Short Term Goal 1 (Week 2): Pt will maintain midline in static standing 75% of the time PT Short Term Goal 2 (Week 2): Pt will consistently transfer with min assist PT Short Term Goal 3 (Week 2): Pt will maintain midline during dynamic standing balance activities 50% of the time  Skilled Therapeutic Interventions/Progress Updates:    Session 1: Pt supine in bed upon PT arrival, reports being nauseated this AM and not up to therapy. Pt does request help taking her jacket off. Pt transferred from supine>sitting EOB with min assist, assist to help get R UE out of jacket sleeve. Pt continues to decline seated or supine exercises this session. Pt transferred back to supine with min assist and adjusted scooting up in bed with min assist. This PT will check back later this afternoon.   Session 2: Pt supine in bed upon PT arrival, and reports still feeling nauseous. Pt denies OOB activity this session but agreeable to bed level exercises. Pt performed supine therex for LE strengthening and R LE Neuro re-ed, each exercises 2 x 10 bilaterally: heel slide, SLR, SAQ, adductor squeezes, hip abduction, bridges, ankle pumps. Therapist performed manual hamstring and heel cord stretches bilaterally 2 x 30 seconds. In between sessions pt engaged in conversation about her hobbies and family, working on word finding. Pt left supine in bed with needs in reach.     Therapy Documentation Precautions:  Precautions Precautions: Fall Precaution Comments: R hemiplegia, expressive aphasia Restrictions Weight Bearing Restrictions: No   See Function Navigator for Current Functional Status.   Therapy/Group: Individual Therapy  Netta Corrigan,  PT, DPT 10/17/2017, 7:52 AM

## 2017-10-17 NOTE — Progress Notes (Signed)
Occupational Therapy Session Note  Patient Details  Name: Kayla Kent MRN: [id number] Date of Birth: [birth year]  Today's Date: 10/17/2017 OT Individual Time: 0109-3235 OT Individual Time Calculation (min): 60 min    Short Term Goals: Week 3:  OT Short Term Goal 1 (Week 3): Continue working on established LTGs set at supervision to min A level.  Skilled Therapeutic Interventions/Progress Updates:      Pt seen for BADL retraining of toileting, bathing, and dressing with a focus on use of RUE, postural control in standing, adaptive techniques.  Pt received in bed stating she needed to toilet.  Pt used RW to ambulated to toilet with mod A to guide RW as she was not able to use RUE with walker splint to control RW adequately and min - mod A for balance. Once at toilet, min A with sit to stand.   Pt pulled briefs down which were soiled with urine and BM.  Needed assist with adequate cleansing. Used RW to transfer to shower. Guiding A to forward lean enough to allow for slow decent in sitting.  Bathed with min A on bench using long sponge to reach feet and L arm.  Stand pivot transfer to w/c with min A.  Pt dressed from w/c.  With set up to find R arm hole, pt was able to actively place R arm into sleeve with guiding A.  She then donned the tshirt the rest of the way. She was able to actively lift her R foot to place into pant leg.  She placed L foot in hole with min A to lean forward always to her foot.  Pt worked on static standing with min A using L hand on bed rail.  When she released her hand to pull her pants up, she needed mod -max A due to instability of R foot and R lean.  Pt completed basic grooming. Pt attempted to actively use her RUE in all tasks. She has developing shoulder AROM to lift her arm and elbow movements.  No active hand movements.  Pt worked on A/AROM of tricep extension in overhead and in 90 degrees of shoulder flexion.  SLP therapist arrived for her next session.   Therapy  Documentation Precautions:  Precautions Precautions: Fall Precaution Comments: R hemiplegia, expressive aphasia Restrictions Weight Bearing Restrictions: No   Pain: Pain Assessment Pain Assessment: No/denies pain Pain Score: 0-No pain ADL:  See Function Navigator for Current Functional Status.   Therapy/Group: Individual Therapy  [place] 10/17/2017, 9:46 AM

## 2017-10-17 NOTE — Progress Notes (Signed)
Speech Language Pathology Daily Session Note  Patient Details  Name: Kayla Kent MRN: [id number] Date of Birth: 04-07-1932  Today's Date: 10/17/2017 SLP Individual Time: 0930-1000 SLP Individual Time Calculation (min): 30 min  Short Term Goals: Week 3: SLP Short Term Goal 1 (Week 3): Pt will name familiar objects with min assist multimodal cues to recognize and correct verbal errors.   SLP Short Term Goal 2 (Week 3): Pt will utilize a basic, text based augmented communication system via pointing to convey immediate needs and wants in ~50% of opportunities with supervision cues SLP Short Term Goal 3 (Week 3): Pt will slow rate and increase vocal intensity to achieve intelligibility at the word level for ~75% accuracy with min assist multimodal cues.   SLP Short Term Goal 4 (Week 3): Pt will sustain her attention to basic, familiar tasks for 10 minute intervals with min verbal cues for redirection.   SLP Short Term Goal 5 (Week 3): Pt will complete basic, functional tasks with mod assist for functional problem solving SLP Short Term Goal 6 (Week 3): Pt will consume therapeutic trials of honey thick liquids and purees with minimal overt s/s of aspiration supervision cues for use of swallowing precautions.  Skilled Therapeutic Interventions: Skilled treatment session focused on cognitive-linguistic goals. SLP facilitated session by providing Mod A verbal cues for problem solving during a 4 step picture sequencing task and for self-monitoring and correcting errors while verbally describing the pictures at the word and phrase level. Patient demonstrated sustained attention to task for ~20 minutes with Mod I. Patient left upright in room with all needs within reach and quick release belt in place. Continue with current plan of care.      Function:   Cognition Comprehension Comprehension assist level: Understands basic 75 - 89% of the time/ requires cueing 10 - 24% of the time  Expression    Expression assist level: Expresses basic 50 - 74% of the time/requires cueing 25 - 49% of the time. Needs to repeat parts of sentences.  Social Interaction Social Interaction assist level: Interacts appropriately 90% of the time - Needs monitoring or encouragement for participation or interaction.  Problem Solving Problem solving assist level: Solves basic 25 - 49% of the time - needs direction more than half the time to initiate, plan or complete simple activities  Memory Memory assist level: Recognizes or recalls 50 - 74% of the time/requires cueing 25 - 49% of the time    Pain Pain Assessment Pain Assessment: No/denies pain Pain Score: 0-No pain  Therapy/Group: Individual Therapy  Kayla Kent, [place] 10/17/2017, 10:15 AM

## 2017-10-17 NOTE — Progress Notes (Addendum)
Has had some nausea and vomiting of tube feeds with activity. Likely not tolerating amount of tube feeds--will adjust to 200 cc five times a day.

## 2017-10-17 NOTE — Plan of Care (Signed)
  Progressing RH BOWEL ELIMINATION RH STG MANAGE BOWEL WITH ASSISTANCE Description STG Manage Bowel with total Assistance.    10/17/2017 0958 - Progressing by Brita Romp, RN RH BLADDER ELIMINATION RH STG MANAGE BLADDER WITH ASSISTANCE Description STG Manage Bladder With total Assistance    10/17/2017 0958 - Progressing by Brita Romp, RN RH SKIN INTEGRITY RH STG SKIN FREE OF INFECTION/BREAKDOWN Description No new breakdown with total assist/cues.     10/17/2017 7414 - Progressing by Brita Romp, RN RH STG MAINTAIN SKIN INTEGRITY WITH ASSISTANCE Description STG Maintain Skin Integrity With total Assistance.   10/17/2017 2395 - Progressing by Brita Romp, RN RH COGNITION-NURSING RH STG ANTICIPATES NEEDS/CALLS FOR ASSIST W/ASSIST/CUES Description STG Anticipates Needs/Calls for Assist With Mod Assistance/Cues.  10/17/2017 0958 - Progressing by Brita Romp, RN RH PAIN MANAGEMENT RH STG PAIN MANAGED AT OR BELOW PT'S PAIN GOAL Description <3 out of 10.   10/17/2017 3202 - Progressing by Brita Romp, RN

## 2017-10-17 NOTE — Progress Notes (Signed)
Patient called stating she is feeling sick.  Upon entering room, patient states she is feeling nauseated.  Obtained emesis bag, placed patient in high fowlers position.  Patient vomited 150 ml of tube feed.  Assisted patient to brush her teeth.  Essential oils applied to patient collar for relief of nausea.  Algis Liming, PA called and notified.  Adjustments made to tube feeding regimen.  Brita Romp, RN

## 2017-10-17 NOTE — Progress Notes (Signed)
Occupational Therapy Session Note  Patient Details  Name: Kayla Kent MRN: [id number] Date of Birth: [birth year]  Today's Date: 10/17/2017 OT Individual Time: 2458-0998 OT Individual Time Calculation (min): 10 min  20 minutes missed due to nausea/vomiting   Short Term Goals: Week 3:  OT Short Term Goal 1 (Week 3): Continue working on established LTGs set at supervision to min A level.  Skilled Therapeutic Interventions/Progress Updates:    Pt greeted supine in bed. Reported feeling ill and that she had been vomiting. Also reported pain in buttocks. Assisted with repositioning for comfort, applied cold wash cloth to forehead, and retrieved additional emesis bags. Notified RN about her nausea. Pt left in bed with all needs. Missed 20 minutes due to illness.   Therapy Documentation Precautions:  Precautions Precautions: Fall Precaution Comments: R hemiplegia, expressive aphasia Restrictions Weight Bearing Restrictions: No General: General OT Amount of Missed Time: 20 Minutes Vital Signs: Therapy Vitals Temp: 97.7 F (36.5 C) Temp Source: Oral Pulse Rate: 93 Resp: 17 BP: (!) 160/62 Patient Position (if appropriate): Sitting Oxygen Therapy SpO2: 98 % O2 Device: Not Delivered     See Function Navigator for Current Functional Status.   Therapy/Group: Individual Therapy  Arshdeep Bolger A Shekira Drummer 10/17/2017, 3:41 PM

## 2017-10-17 NOTE — Progress Notes (Signed)
[facility]     PROGRESS NOTE  Subjective/Complaints:  Pt seen laying in bed this AM.  She slept well overnight.  She hada  Good weekend.  Discussed with nursing CBGs with bolus feeds.   ROS: Denies CP, SOB, N/V/D.  Objective: Vital Signs: Blood pressure (!) 139/48, pulse 82, temperature 98.2 F (36.8 C), temperature source Oral, resp. rate 18, height 5\' 7"  (1.702 m), weight 43.4 kg (95 lb 10.9 oz), SpO2 95 %. No results found. No results for input(s): WBC, HGB, HCT, PLT in the last 72 hours. No results for input(s): NA, K, CL, GLUCOSE, BUN, CREATININE, CALCIUM in the last 72 hours.  Invalid input(s): CO CBG (last 3)  Recent Labs    10/16/17 1635 10/16/17 2204 10/17/17 0612  GLUCAP 98 65 78    Wt Readings from Last 3 Encounters:  10/17/17 43.4 kg (95 lb 10.9 oz)  09/28/17 46.4 kg (102 lb 4.7 oz)  09/19/17 49.8 kg (109 lb 12.6 oz)    Physical Exam:  BP (!) 139/48 (BP Location: Left Arm)   Pulse 82   Temp 98.2 F (36.8 C) (Oral)   Resp 18   Ht 5\' 7"  (1.702 m)   Wt 43.4 kg (95 lb 10.9 oz)   SpO2 95%   BMI 14.99 kg/m  Constitutional: She appears well-developed. She appears cachectic. NAD.  HENT: Normocephalic and atraumatic.  Eyes: No discharge. Dysconjugate gaze with right eye in lateral field (stable).  Cardiovascular: Irregularly irregular without JVD. Respiratory: Upper airway sounds. Unlabored. GI: Bowel sounds are normal. She exhibits no distension. +PEG. Musculoskeletal: She exhibits no edema or tenderness in extremities.  Neurological: She is alert. Right facial droop Severe dysarthria, slowly improving.  Able  to follow simple one step motor commands.  Motor: RUE: 4/5 shoulder abduction, 4-/5 elbow flex/ext, 1/5 wrist extension, 2/5 hand grip. (stable) RLE: 4+/5 proximal to distal (stable) Skin: Skin is warm and dry. She is not diaphoretic.  Psychiatric: She is flat.  Assessment/Plan: 1. Functional deficits secondary  to left MCA infarct which require 3+ hours per day of interdisciplinary therapy in a comprehensive inpatient rehab setting. Physiatrist is providing close team supervision and 24 hour management of active medical problems listed below. Physiatrist and rehab team continue to assess barriers to discharge/monitor patient progress toward functional and medical goals.  Function:  Bathing Bathing position   Position: Shower  Bathing parts Body parts bathed by patient: Right arm, Chest, Abdomen, Right upper leg, Left upper leg, Right lower leg, Front perineal area, Buttocks, Left lower leg Body parts bathed by helper: Left arm, Back  Bathing assist Assist Level: Touching or steadying assistance(Pt > 75%)      Upper Body Dressing/Undressing Upper body dressing   What is the patient wearing?: Pull over shirt/dress     Pull over shirt/dress - Perfomed by patient: Thread/unthread left sleeve, Put head through opening, Pull shirt over trunk Pull over shirt/dress - Perfomed by helper: Thread/unthread right sleeve        Upper body assist Assist Level: Touching or steadying assistance(Pt > 75%)      Lower Body Dressing/Undressing Lower body dressing   What is the patient wearing?: Pants, Shoes, Socks     Pants- Performed by patient: Pull pants up/down Pants- Performed by helper: Thread/unthread right pants leg, Thread/unthread left pants leg   Non-skid slipper socks- Performed by helper: Don/doff right sock, Don/doff left sock Socks - Performed by patient: Don/doff left sock Socks - Performed by  helper: Don/doff right sock Shoes - Performed by patient: Don/doff right shoe, Don/doff left shoe, Fasten right, Fasten left Shoes - Performed by helper: Don/doff left shoe          Lower body assist Assist for lower body dressing: Touching or steadying assistance (Pt > 75%)      Toileting Toileting Toileting activity did not occur: No continent bowel/bladder event Toileting steps completed  by patient: Adjust clothing prior to toileting, Performs perineal hygiene Toileting steps completed by helper: Adjust clothing prior to toileting, Performs perineal hygiene, Adjust clothing after toileting Toileting Assistive Devices: Grab bar or rail  Toileting assist Assist level: Touching or steadying assistance (Pt.75%)   Transfers Chair/bed transfer   Chair/bed transfer method: Stand pivot, Ambulatory Chair/bed transfer assist level: Touching or steadying assistance (Pt > 75%) Chair/bed transfer assistive device: Armrests     Locomotion Ambulation Ambulation activity did not occur: Safety/medical concerns   Max distance: 100' Assist level: Touching or steadying assistance (Pt > 75%)   Wheelchair   Type: Manual Max wheelchair distance: 150 Assist Level: Supervision or verbal cues  Cognition Comprehension Comprehension assist level: Understands basic 75 - 89% of the time/ requires cueing 10 - 24% of the time  Expression Expression assist level: Expresses basic 50 - 74% of the time/requires cueing 25 - 49% of the time. Needs to repeat parts of sentences.  Social Interaction Social Interaction assist level: Interacts appropriately 90% of the time - Needs monitoring or encouragement for participation or interaction.  Problem Solving Problem solving assist level: Solves basic 25 - 49% of the time - needs direction more than half the time to initiate, plan or complete simple activities  Memory Memory assist level: Recognizes or recalls 50 - 74% of the time/requires cueing 25 - 49% of the time    Medical Problem List and Plan: 1.  Hemiparesis, aphasia, dysphagia secondary to Left MCA infarct.   Continue CIR   WHO    ?Palliative consult completed 2.  DVT Prophylaxis/Anticoagulation: Pharmaceutical: Other (comment)--Eliquis 3. Pain Management: Tylenol prn  4. Mood: LCSW to follow for evaluation and support.    Lexapro started on 12/27 5. Neuropsych: This patient is not fully capable  of making decisions on her own behalf. 6. Skin/Wound Care: Pressure relief measures.Maintain adequate hydration/nutritional status.  7. Fluids/Electrolytes/Nutrition: Adjust tube feeds to maintain adequate nutritional status. Consulted dietitian for input and adjustment.    BMP within acceptable range on 12/24 8. RUL/RLL aspiration PNA   Scheduled Robitussin-DM to help with cough and secretions    Repeat chest x-ray reviewed, showing persistent right upper lobe disease, but resolution of right lower lobe infiltrate. Per Pulm, pro calcitonin ordered, reviewed, WNL, antibiotics DC'd. No need for further imaging at this time. Follow-up CT in 3 months   Repeat chest x-ray reviewed, showing stable abnormality.   Continue to monitor 9. ?CKD stage III: Monitor renal status with routine checks. Water flushes via PEG.    Creatinine 0.67 on 12/24   Labs pending    Appears to be controlled with adequate hydration 10 Recent GIB: Back on Eliquis.    Hemoglobin 10.9 on 12/24   Labs pending   Continue to monitor 11. Seizure disorder: stable/controlled on Keppra. 12. A Fib: Monitor HR bid. Continue metoprolol and decrease ASA to 81 mg/day.    Heart rate controlled on 12/31 13. Thrombocytosis: Resolved   Likely reactive.    Will continue to monitor.  14. Dysphagia: NPO (holding bite trials with ST), tolerating tube  feeds 15. Protein calorie malnutrition: Chronic with baseline albumin around 2.5 in the past 6 months.    Pre-albumin 16.9 on 12/13.    Continue to monitor 16. COPD (per X ray)/RUL bronchogenic lung CA: completed XRT 06/2017 17. Hypokalemia: Resolved   Potassium 4.5 on 12/24   Labs pending   Continue to monitor 18. Leukocytosis: Resolved  Cont to monitor 19. Hyperglycemia   Secondary to tube feeds, resolved with bolus feeds, CBGs d/ced on 12/31 20. Urinary frequency  Appears to be retaining now, Ditropan DC'd   Will consider further meds tomorrow   Bethanechol 10 started on 12/28    ?Improving 21. Elevated blood pressure  Overall controlled on 12/31  LOS (Days) 19 A FACE TO FACE EVALUATION WAS PERFORMED  Ankit Lorie Phenix 10/17/2017 8:53 AM

## 2017-10-18 ENCOUNTER — Inpatient Hospital Stay (HOSPITAL_COMMUNITY): Payer: Self-pay | Admitting: Physical Therapy

## 2017-10-18 ENCOUNTER — Inpatient Hospital Stay (HOSPITAL_COMMUNITY): Payer: Medicare Other | Admitting: Occupational Therapy

## 2017-10-18 DIAGNOSIS — E871 Hypo-osmolality and hyponatremia: Secondary | ICD-10-CM

## 2017-10-18 NOTE — Progress Notes (Signed)
Occupational Therapy Session Note  Patient Details  Name: Kayla Kent MRN: [id number] Date of Birth: 1932/03/06  Today's Date: 10/18/2017 OT Individual Time: 3832-9191 OT Individual Time Calculation (min): 34 min    Short Term Goals: Week 1:  OT Short Term Goal 1 (Week 1): Pt will complete bathing sit to stand with mod assist. OT Short Term Goal 1 - Progress (Week 1): Met OT Short Term Goal 2 (Week 1): Pt will transfer to the 3:1 with mod assist stand pivot.   OT Short Term Goal 2 - Progress (Week 1): Met OT Short Term Goal 3 (Week 1): Pt will donn a pullover shirt with min assist and mod demonstrational cueing.  OT Short Term Goal 3 - Progress (Week 1): Met OT Short Term Goal 4 (Week 1): Pt will perform LB dressing with mod assist for gripper socks and pull up pants.   OT Short Term Goal 4 - Progress (Week 1): Not met OT Short Term Goal 5 (Week 1): Pt will use the RUE as a stabilizer with max hand over hand assistance.   OT Short Term Goal 5 - Progress (Week 1): Met Week 2:  OT Short Term Goal 1 (Week 2): Pt will perform LB dressing with mod assist for gripper socks and pull up pants.   OT Short Term Goal 1 - Progress (Week 2): Not met OT Short Term Goal 2 (Week 2): Pt will complete LB bathing sit to stand with min assist 2 consecutive sessions.  OT Short Term Goal 2 - Progress (Week 2): Not met OT Short Term Goal 3 (Week 2): Pt will complete toileting with min assist sit to stand.   OT Short Term Goal 3 - Progress (Week 2): Not met OT Short Term Goal 4 (Week 2): Pt will donn pullover shirt with supervision and mod instructional cueing. OT Short Term Goal 4 - Progress (Week 2): Not met Week 3:  OT Short Term Goal 1 (Week 3): Continue working on established LTGs set at supervision to min A level.  Skilled Therapeutic Interventions/Progress Updates:    Pt received in bed and requested to stay in bed for therapy.  Pt seen this session for RUE NMR with a focus on activities to increase  wrist and finger active movement.  Pt was demonstrating minimal wrist control and trace digit movement at start of session. Worked proximal to distal with shoulder AROM and resisted sh flexion.  Resisted elbow flex/ext to progress strength.  Facilitation of wrist with placing and holding to maintain wrist in neutral with active pronation/ supination.  Pt was able to maintain neutral wrist for a minute a time.  Facilitation of grasp with hand over hand guiding of grasping cone, soft toy bear, or a small cup with combined shoulder AROM for a reaching activity.   At end of session, pt was able to actively squeeze R fingers onto a  Roll of ACE wrap.  Repeated that exercise a few times.  Placed toy bear in hand at end of session and encouraged pt to continue working on her grasp this afternoon. Pt in bed with all needs met.    Therapy Documentation Precautions:  Precautions Precautions: Fall Precaution Comments: R hemiplegia, expressive aphasia Restrictions Weight Bearing Restrictions: No  Pain: Pain Assessment Pain Assessment: No/denies pain ADL:    See Function Navigator for Current Functional Status.   Therapy/Group: Individual Therapy  [place] 10/18/2017, 1:43 PM

## 2017-10-18 NOTE — Progress Notes (Signed)
Physical Therapy Session Note  Patient Details  Name: Kayla Kent MRN: [id number] Date of Birth: 1931-12-24  Today's Date: 10/18/2017 PT Individual Time: 1015-1045 PT Individual Time Calculation (min): 30 min   Short Term Goals: Week 2:  PT Short Term Goal 1 (Week 2): Pt will maintain midline in static standing 75% of the time PT Short Term Goal 2 (Week 2): Pt will consistently transfer with min assist PT Short Term Goal 3 (Week 2): Pt will maintain midline during dynamic standing balance activities 50% of the time  Skilled Therapeutic Interventions/Progress Updates:   Pt in w/c upon arrival and agreeable to therapy, no c/o pain. Total assist w/c transport to gym for time management. Worked on static and dynamic standing balance while performing LUE tasks including throwing bean bags and horseshoes. Min assist overall for balance via R HHA or min guard. Emphasis on maintaining midline orientation in stance. Performed 1 bout of standing and performing UE tasks w/ RLE on 3" step to facilitate LLE weight-bearing w/o L pushing. Mod assist for balance during this task as pt w/ increased R posterolateral lean. Performed R step taps to 3" step, mod assist for balance during this task as well. Pt unable to self-correct w/ verbal cues for corrective balance strategies, required manual facilitation to assist. Worked on independence w/ mobility and ambulated 125' w/ min assist for balance w/o AD and self-propelled w/c back to room, 100' w/ supervision using BLEs. Returned to room and ended session in supine, call bell within reach and all needs met.   Therapy Documentation Precautions:  Precautions Precautions: Fall Precaution Comments: R hemiplegia, expressive aphasia Restrictions Weight Bearing Restrictions: No  See Function Navigator for Current Functional Status.   Therapy/Group: Individual Therapy  [place]uchess Armendarez K Arnette 10/18/2017, 12:28 PM

## 2017-10-18 NOTE — Plan of Care (Signed)
  Progressing RH BOWEL ELIMINATION RH STG MANAGE BOWEL WITH ASSISTANCE Description STG Manage Bowel with total Assistance.    10/18/2017 1139 - Progressing by Brita Romp, RN RH BLADDER ELIMINATION RH STG MANAGE BLADDER WITH ASSISTANCE Description STG Manage Bladder With total Assistance    10/18/2017 1139 - Progressing by Brita Romp, RN RH SKIN INTEGRITY RH STG SKIN FREE OF INFECTION/BREAKDOWN Description No new breakdown with total assist/cues.     10/18/2017 1139 - Progressing by Brita Romp, RN RH STG MAINTAIN SKIN INTEGRITY WITH ASSISTANCE Description STG Maintain Skin Integrity With total Assistance.   10/18/2017 1139 - Progressing by Brita Romp, RN RH COGNITION-NURSING RH STG ANTICIPATES NEEDS/CALLS FOR ASSIST W/ASSIST/CUES Description STG Anticipates Needs/Calls for Assist With Mod Assistance/Cues.  10/18/2017 1139 - Progressing by Brita Romp, RN RH PAIN MANAGEMENT RH STG PAIN MANAGED AT OR BELOW PT'S PAIN GOAL Description <3 out of 10.   10/18/2017 1139 - Progressing by Brita Romp, RN

## 2017-10-18 NOTE — Progress Notes (Signed)
Patient has had several episodes of urinary incontinence today and her urine has developed a strong odor. UA and culture ordered per instructions.

## 2017-10-18 NOTE — Progress Notes (Signed)
[facility]     PROGRESS NOTE  Subjective/Complaints:  Pt seen lying in bed this Am.  She slept well overnight.  Yesterday, TFs were adjusted due to nausea.    ROS: Denies CP, SOB, N/V/D.  Objective: Vital Signs: Blood pressure (!) 126/46, pulse 78, temperature 97.7 F (36.5 C), temperature source Oral, resp. rate 18, height 5\' 7"  (1.702 m), weight 42.8 kg (94 lb 5.7 oz), SpO2 97 %. No results found. Recent Labs    10/17/17 0923  WBC 11.5*  HGB 11.8*  HCT 38.3  PLT 342   Recent Labs    10/17/17 0923  NA 130*  K 3.9  CL 95*  GLUCOSE 168*  BUN 21*  CREATININE 0.81  CALCIUM 9.0   CBG (last 3)  Recent Labs    10/16/17 1635 10/16/17 2204 10/17/17 0612  GLUCAP 98 65 78    Wt Readings from Last 3 Encounters:  10/18/17 42.8 kg (94 lb 5.7 oz)  09/28/17 46.4 kg (102 lb 4.7 oz)  09/19/17 49.8 kg (109 lb 12.6 oz)    Physical Exam:  BP (!) 126/46 (BP Location: Right Arm)   Pulse 78   Temp 97.7 F (36.5 C) (Oral)   Resp 18   Ht 5\' 7"  (1.702 m)   Wt 42.8 kg (94 lb 5.7 oz)   SpO2 97%   BMI 14.78 kg/m  Constitutional: She appears well-developed. She appears cachectic. NAD.  HENT: Normocephalic and atraumatic.  Eyes: No discharge. Dysconjugate gaze with right eye in lateral field (stable).  Cardiovascular: Irregularly irregular without JVD. Respiratory: Upper airway sounds. Unlabored. GI: Bowel sounds are normal. She exhibits no distension. +PEG. Musculoskeletal: She exhibits no edema or tenderness in extremities.  Neurological: She is alert. Right facial droop Severe dysarthria, slowly improving.  Able  to follow simple one step motor commands.  Motor: RUE: 4/5 shoulder abduction, 4-/5 elbow flex/ext, 1/5 wrist extension, 2/5 hand grip (unchanged) RLE: 4+/5 proximal to distal (stable) Skin: Skin is warm and dry. She is not diaphoretic.  Psychiatric: She is flat.  Assessment/Plan: 1. Functional deficits secondary to left MCA  infarct which require 3+ hours per day of interdisciplinary therapy in a comprehensive inpatient rehab setting. Physiatrist is providing close team supervision and 24 hour management of active medical problems listed below. Physiatrist and rehab team continue to assess barriers to discharge/monitor patient progress toward functional and medical goals.  Function:  Bathing Bathing position   Position: Shower  Bathing parts Body parts bathed by patient: Right arm, Chest, Abdomen, Right upper leg, Left upper leg, Right lower leg, Front perineal area, Left lower leg, Left arm(used long sponge to reach left arm with mod cues) Body parts bathed by helper: Back, Buttocks  Bathing assist Assist Level: Touching or steadying assistance(Pt > 75%)      Upper Body Dressing/Undressing Upper body dressing   What is the patient wearing?: Pull over shirt/dress     Pull over shirt/dress - Perfomed by patient: Thread/unthread left sleeve, Put head through opening, Pull shirt over trunk Pull over shirt/dress - Perfomed by helper: Thread/unthread right sleeve        Upper body assist Assist Level: Touching or steadying assistance(Pt > 75%)      Lower Body Dressing/Undressing Lower body dressing   What is the patient wearing?: Pants, Shoes, Socks     Pants- Performed by patient: Pull pants up/down, Thread/unthread right pants leg Pants- Performed by helper: Thread/unthread left pants leg   Non-skid slipper  socks- Performed by helper: Don/doff right sock, Don/doff left sock Socks - Performed by patient: Don/doff left sock Socks - Performed by helper: Don/doff right sock, Don/doff left sock Shoes - Performed by patient: Don/doff left shoe Shoes - Performed by helper: Don/doff right shoe          Lower body assist Assist for lower body dressing: Touching or steadying assistance (Pt > 75%)      Toileting Toileting Toileting activity did not occur: No continent bowel/bladder event Toileting  steps completed by patient: Performs perineal hygiene Toileting steps completed by helper: Adjust clothing prior to toileting, Adjust clothing after toileting Toileting Assistive Devices: Grab bar or rail  Toileting assist Assist level: Touching or steadying assistance (Pt.75%)   Transfers Chair/bed transfer   Chair/bed transfer method: Stand pivot, Ambulatory Chair/bed transfer assist level: Touching or steadying assistance (Pt > 75%) Chair/bed transfer assistive device: Armrests     Locomotion Ambulation Ambulation activity did not occur: Safety/medical concerns   Max distance: 100' Assist level: Touching or steadying assistance (Pt > 75%)   Wheelchair   Type: Manual Max wheelchair distance: 150 Assist Level: Supervision or verbal cues  Cognition Comprehension Comprehension assist level: Understands basic 75 - 89% of the time/ requires cueing 10 - 24% of the time  Expression Expression assist level: Expresses basic 50 - 74% of the time/requires cueing 25 - 49% of the time. Needs to repeat parts of sentences.  Social Interaction Social Interaction assist level: Interacts appropriately 90% of the time - Needs monitoring or encouragement for participation or interaction.  Problem Solving Problem solving assist level: Solves basic 25 - 49% of the time - needs direction more than half the time to initiate, plan or complete simple activities  Memory Memory assist level: Recognizes or recalls 50 - 74% of the time/requires cueing 25 - 49% of the time    Medical Problem List and Plan: 1.  Hemiparesis, aphasia, dysphagia secondary to Left MCA infarct.   Continue CIR   WHO    Palliative consult completed - plan for SNF 2.  DVT Prophylaxis/Anticoagulation: Pharmaceutical: Other (comment)--Eliquis 3. Pain Management: Tylenol prn  4. Mood: LCSW to follow for evaluation and support.    Lexapro started on 12/27 5. Neuropsych: This patient is not fully capable of making decisions on her own  behalf. 6. Skin/Wound Care: Pressure relief measures.Maintain adequate hydration/nutritional status.  7. Fluids/Electrolytes/Nutrition: Adjust tube feeds to maintain adequate nutritional status. Consulted dietitian for input and adjustment.    BMP within acceptable range on 12/24   TFs adjusted again on 12/31 8. RUL/RLL aspiration PNA   Scheduled Robitussin-DM to help with cough and secretions    Repeat chest x-ray reviewed, showing persistent right upper lobe disease, but resolution of right lower lobe infiltrate. Per Pulm, pro calcitonin ordered, reviewed, WNL, antibiotics DC'd. No need for further imaging at this time. Follow-up CT in 3 months   Repeat chest x-ray reviewed, showing stable abnormality.   Continue to monitor 9. ?CKD stage III: Monitor renal status with routine checks. Water flushes via PEG.    Creatinine 0.81 on 12/31   Labs pending    Appears to be controlled with adequate hydration 10 Recent GIB: Back on Eliquis.    Hemoglobin 11.8 on 12/31   Continue to monitor 11. Seizure disorder: stable/controlled on Keppra. 12. A Fib: Monitor HR bid. Continue metoprolol and decrease ASA to 81 mg/day.    Heart rate controlled on 12/31 13. Thrombocytosis: Resolved   Likely reactive.  Will continue to monitor.  14. Dysphagia: NPO (holding bite trials with ST), tolerating tube feeds 15. Protein calorie malnutrition: Chronic with baseline albumin around 2.5 in the past 6 months.    Pre-albumin 16.9 on 12/13.    Continue to monitor 16. COPD (per X ray)/RUL bronchogenic lung CA: completed XRT 06/2017 17. Hypokalemia: Resolved   Continue to monitor 18. Leukocytosis: Resolved  Cont to monitor 19. Hyperglycemia   Secondary to tube feeds, resolved with bolus feeds, CBGs d/ced on 12/31 20. Urinary frequency  Appears to be retaining now, Ditropan DC'd   Will consider further meds tomorrow   Bethanechol 10 started on 12/28   ?Improving 21. Elevated blood pressure  Overall  controlled on 1/1 22. Hyponatremia  Na 130 on 12/31  Labs ordered for tomorrow   Cont to monitor 23. Leukocytosis  WBCs 11.5 on 12/31  Labs ordered for tomorrow  Afebrile   Cont to monitor   LOS (Days) 20 A FACE TO FACE EVALUATION WAS PERFORMED  Khalik Pewitt Lorie Phenix 10/18/2017 8:15 AM

## 2017-10-19 ENCOUNTER — Inpatient Hospital Stay (HOSPITAL_COMMUNITY): Payer: Medicare Other | Admitting: Speech Pathology

## 2017-10-19 ENCOUNTER — Inpatient Hospital Stay (HOSPITAL_COMMUNITY): Payer: Self-pay | Admitting: Occupational Therapy

## 2017-10-19 ENCOUNTER — Inpatient Hospital Stay (HOSPITAL_COMMUNITY): Payer: Self-pay

## 2017-10-19 LAB — CBC WITH DIFFERENTIAL/PLATELET
BASOS PCT: 0 %
Basophils Absolute: 0 10*3/uL (ref 0.0–0.1)
EOS ABS: 0.1 10*3/uL (ref 0.0–0.7)
EOS PCT: 1 %
HCT: 37.7 % (ref 36.0–46.0)
Hemoglobin: 11.5 g/dL — ABNORMAL LOW (ref 12.0–15.0)
LYMPHS ABS: 1.5 10*3/uL (ref 0.7–4.0)
Lymphocytes Relative: 17 %
MCH: 30.1 pg (ref 26.0–34.0)
MCHC: 30.5 g/dL (ref 30.0–36.0)
MCV: 98.7 fL (ref 78.0–100.0)
Monocytes Absolute: 0.7 10*3/uL (ref 0.1–1.0)
Monocytes Relative: 7 %
Neutro Abs: 7 10*3/uL (ref 1.7–7.7)
Neutrophils Relative %: 75 %
PLATELETS: 323 10*3/uL (ref 150–400)
RBC: 3.82 MIL/uL — AB (ref 3.87–5.11)
RDW: 15.5 % (ref 11.5–15.5)
WBC: 9.2 10*3/uL (ref 4.0–10.5)

## 2017-10-19 LAB — URINALYSIS, ROUTINE W REFLEX MICROSCOPIC
BILIRUBIN URINE: NEGATIVE
Glucose, UA: NEGATIVE mg/dL
Hgb urine dipstick: NEGATIVE
Ketones, ur: NEGATIVE mg/dL
NITRITE: NEGATIVE
Protein, ur: NEGATIVE mg/dL
SPECIFIC GRAVITY, URINE: 1.017 (ref 1.005–1.030)
pH: 7 (ref 5.0–8.0)

## 2017-10-19 LAB — BASIC METABOLIC PANEL
Anion gap: 9 (ref 5–15)
BUN: 19 mg/dL (ref 6–20)
CALCIUM: 8.4 mg/dL — AB (ref 8.9–10.3)
CHLORIDE: 96 mmol/L — AB (ref 101–111)
CO2: 26 mmol/L (ref 22–32)
CREATININE: 0.8 mg/dL (ref 0.44–1.00)
Glucose, Bld: 124 mg/dL — ABNORMAL HIGH (ref 65–99)
Potassium: 4.4 mmol/L (ref 3.5–5.1)
SODIUM: 131 mmol/L — AB (ref 135–145)

## 2017-10-19 MED ORDER — PRO-STAT SUGAR FREE PO LIQD
30.0000 mL | Freq: Every day | ORAL | Status: DC
  Administered 2017-10-19 – 2017-10-23 (×5): 30 mL
  Filled 2017-10-19 (×5): qty 30

## 2017-10-19 NOTE — Telephone Encounter (Signed)
LMTCB

## 2017-10-19 NOTE — Progress Notes (Signed)
Social Work Patient ID: Kayla Kent, female   DOB: 06/01/1932, 84 y.o.   MRN: [id number]   [name]  Social Worker  Physical Medicine and Rehabilitation  Patient Care Conference  Signed  Date of Service:  10/19/2017  1:28 PM          Signed          [] Hide copied text  [] Hover for details   Inpatient RehabilitationTeam Conference and Plan of Care Update Date: 10/19/2017   Time: 11:00 AM      Patient Name: Kayla Kent      Medical Record Number: [id number]  Date of Birth: 18-May-1932 Sex: Female         Room/Bed: 4M03C/4M03C-01 Payor Info: Payor: MEDICARE / Plan: MEDICARE PART A AND B / Product Type: *No Product type* /     Admitting Diagnosis: L CVA  Admit Date/Time:  09/28/2017  3:20 PM Admission Comments: No comment available    Primary Diagnosis:  Acute ischemic left MCA stroke ([place]) Principal Problem: Acute ischemic left MCA stroke [facility])       Patient Active Problem List    Diagnosis Date Noted  . Hyponatremia    . Elevated blood pressure reading    . Gastrointestinal hemorrhage    . Palliative care by specialist    . Palliative care encounter    . Anxiety state    . Urinary retention    . Confusion    . Abnormal chest x-ray    . Hyperglycemia    . Atrial fibrillation ([place])    . Acute blood loss anemia    . Dysarthria, post-stroke    . PNA (pneumonia)    . Moderate protein-calorie malnutrition ([place])    . Dysphagia, post-stroke    . Aspiration pneumonia of right lower lobe ([place])    . Stage 3 chronic kidney disease ([place])    . Thrombocytosis ([place])    . PAF (paroxysmal atrial fibrillation) ([place])    . Severe protein-calorie malnutrition ([place])    . Hypokalemia    . Acute respiratory failure with hypoxia ([place]) 09/19/2017  . Dysphagia 09/19/2017  . History of GI bleed    . Acute ischemic left MCA stroke ([place]) 09/16/2017  . Cerebral thrombosis with cerebral infarction 09/13/2017  . Fever    . Aspiration pneumonia due to gastric  secretions ([place])    . Leukocytosis    . Acute hypoxemic respiratory failure ([place]) 09/11/2017  . Melena    . Pressure injury of skin 09/09/2017  . Heme positive stool    . Acute upper GI bleed 09/06/2017  . HTN (hypertension) 09/06/2017  . Seizure disorder ([place]) 09/06/2017  . Chronic atrial fibrillation ([place]) 09/06/2017  . Thyroid nodule 09/06/2017  . CKD (chronic kidney disease), stage III ([place]) 09/06/2017  . Dyslipidemia 09/06/2017  . COPD (chronic obstructive pulmonary disease) ([place]) 09/06/2017  . CKD (chronic kidney disease) stage 3, GFR 30-59 ml/min (HCC)    . Malignant neoplasm of right upper lobe of lung ([place]) 06/21/2017  . Pulmonary emphysema ([place]) 04/26/2017  . Pleural effusion 04/26/2017  . Near syncope    . Gastroesophageal reflux disease    . Chest pain 04/14/2017  . Lung nodule < 6cm on CT 04/12/2017  . Acute renal failure ([place])    . Sepsis ([place]) 04/06/2017  . Atrial fibrillation with RVR ([place]) 04/06/2017  . Urinary frequency 03/03/2017  . Hemorrhoids 03/03/2017  . Insomnia 03/03/2017  . Postoperative anemia due to acute blood  loss 11/24/2016  . Hematoma left thigh 11/24/2016  . Closed femur fracture ([place]ill[place]) 11/20/2016  . Pedal edema 10/10/2016  . Persistent atrial fibrillation ([place]) 12/11/2014  . S/P ablation of atrial fibrillation 12/11/2014  . Orthostatic hypotension 12/11/2014  . Paroxysmal SVT (supraventricular tachycardia) ([place]) 12/11/2014  . Atherosclerotic peripheral vascular disease ([place]) 12/11/2014  . Paroxysmal atrial fibrillation ([place]) 11/05/2014  . Orthostasis 10/03/2014  . Non-compliant behavior 10/01/2014  . TIA (transient ischemic attack) 09/23/2014  . Numbness of left hand    . Acute on chronic diastolic heart failure ([place]) 09/21/2014  . Fracture of femoral neck, right ([place]) 08/28/2014  . Distal radius fracture, right 08/28/2014  . Hip fracture requiring operative repair ([place]) 08/28/2014  . Paroxysmal spells 03/28/2014  . Seizures ([place])  03/28/2013  . Syncope and collapse 07/27/2012  . Routine health maintenance 12/12/2011  . PERIPHERAL CIRCULATORY DISORDER 04/10/2010  . Chronic diastolic CHF (congestive heart failure) ([place]) 07/17/2009  . Depression 07/04/2009  . Anxiety 01/19/2008  . HLD (hyperlipidemia) 11/14/2007  . Essential hypertension 11/14/2007  . PAROXYSMAL ATRIAL TACHYCARDIA 11/14/2007  . UTERINE PROLAPSE 11/14/2007      Expected Discharge Date: Expected Discharge Date: 10/22/17   Team Members Present: Physician leading conference: Dr. Delice Lesch Social Worker Present: Ovidio Kin, LCSW Nurse Present: Leonette Nutting, RN PT Present: Michaelene Song, PT OT Present: Clyda Greener, OT SLP Present: Windell Moulding, SLP PPS Coordinator present : Daiva Nakayama, RN, CRRN       Current Status/Progress Goal Weekly Team Focus  Medical     Hemiparesis, aphasia, dysphagia secondary to Left MCA infarct  Improve mobility, safety, dysphagia, bladder symptoms  See above   Bowel/Bladder     Continent of bowel and bladder during day; urinary frequency; on urecholine for retention; LBM 12/31  Mod I assist  Assess and treat for constipation as needed; monitor PVRs for retention; timed toileting if patient incontinent   Swallow/Nutrition/ Hydration     improved management of her secretions, NPO with PEG per repeat MBS last week  min assist   pharyngeal strengthening exercises, trials of honey thck liquids via teaspoon and purees    ADL's     Min assist for all bathing sit to stand with max demonstrational cueing for sequencing secondary to motor planning.  Supervision to min assist for UB dressing with mod assist for LB dressing.  Still with decreased right hand function.  Needs max assist for integration into ADL tasks  min assist overall  selfcare retraining, balance retraining, transfer training, neuromuscular re-education, pt/family education, therapeutic exercise.     Mobility     supevision bed mobility and w/c mobility  150', min assist transfers and gait, gait 100-150' w/ or w/o AD min assist for balance, L pushing increases w/ fatigue towards end of session or end of ambulation bouts   supervision to min assist overall   postural control, pushing, NMR, quality of gait, independence w/ transfers, OOB tolerance, family edu if they decide to take pt home   Communication     min-mod assist   min assist (upgraded)  intelligibility with verbal expression, awareness of verbal errors, supplement as needed with communication board    Safety/Cognition/ Behavioral Observations   min-mod assist   min assist (upgraded)  problem solving and awareness/correction of motor planning errors    Pain     Denies pain  < 3  Assess and treat for pain q shift and prn   Skin     Heels boggy- prevalon  boots; unstageable to sacrum- santyl daily; MASD peri area- Gerhardts  Mod assist  Assess skin q shift and prn; perform dressing changes per MD order     *See Care Plan and progress notes for long and short-term goals.      Barriers to Discharge   Current Status/Progress Possible Resolutions Date Resolved   Physician     Decreased caregiver support;Medical stability;Nutrition means;Incontinence;Lack of/limited family support     See above  Therapies, follow labs, cont TFs - now bolus, bladder workup      Nursing                 PT  Inaccessible home environment;Decreased caregiver support;Lack of/limited family support;Home environment access/layout;Nutrition means  lives alone, family may not be able to provide 24/7 physical assistance, pt requires feeding via PEG at this time               OT                 SLP            SW              Discharge Planning/Teaching Needs:    Pt and family have decided short term NHP is best option for pt for more rehab and longer time to recover     Team Discussion:  Making steady progress in therapies, some goals have been upgraded to min-supervision level. Still having motor planning  and sequencing issues, along with apraxia. Palliative consult completed with pt and family. Trials of po per speech. MD checking UA for UTI. Work on [employer]  Revisions to Treatment Plan:  NHP    Continued Need for Acute Rehabilitation Level of Care: The patient requires daily medical management by a physician with specialized training in physical medicine and rehabilitation for the following conditions: Daily direction of a multidisciplinary physical rehabilitation program to ensure safe treatment while eliciting the highest outcome that is of practical value to the patient.: Yes Daily medical management of patient stability for increased activity during participation in an intensive rehabilitation regime.: Yes Daily analysis of laboratory values and/or radiology reports with any subsequent need for medication adjustment of medical intervention for : Neurological problems;Nutritional problems;Urological problems   Tyannah Sane, Gardiner Rhyme 10/19/2017, 1:28 PM                  Adarian Bur, Gardiner Rhyme, LCSW  Social Worker  Physical Medicine and Rehabilitation  Patient Care Conference  Signed  Date of Service:  10/12/2017  2:05 PM          Signed          [] Hide copied text  [] Hover for details   Inpatient RehabilitationTeam Conference and Plan of Care Update Date: 10/12/2017   Time: 11:20 AM      Patient Name: Kayla Kent      Medical Record Number: [id number]  Date of Birth: 24-May-1932 Sex: Female         Room/Bed: 4M03C/4M03C-01 Payor Info: Payor: MEDICARE / Plan: MEDICARE PART A AND B / Product Type: *No Product type* /     Admitting Diagnosis: L CVA  Admit Date/Time:  09/28/2017  3:20 PM Admission Comments: No comment available    Primary Diagnosis:  Acute ischemic left MCA stroke ([place]) Principal Problem: Acute ischemic left MCA stroke [facility])       Patient Active Problem List    Diagnosis Date Noted  . Confusion    . Abnormal  chest x-ray    . Hyperglycemia    . Atrial  fibrillation ([place])    . Acute blood loss anemia    . Dysarthria, post-stroke    . PNA (pneumonia)    . Moderate protein-calorie malnutrition ([place])    . Dysphagia, post-stroke    . Aspiration pneumonia of right lower lobe ([place])    . Stage 3 chronic kidney disease ([place])    . Thrombocytosis ([place])    . PAF (paroxysmal atrial fibrillation) ([place])    . Severe protein-calorie malnutrition ([place])    . Hypokalemia    . Acute respiratory failure with hypoxia ([place]) 09/19/2017  . Dysphagia 09/19/2017  . History of GI bleed    . Acute ischemic left MCA stroke ([place]) 09/16/2017  . Cerebral thrombosis with cerebral infarction 09/13/2017  . Fever    . Aspiration pneumonia due to gastric secretions ([place])    . Leukocytosis    . Acute hypoxemic respiratory failure ([place]) 09/11/2017  . Melena    . Pressure injury of skin 09/09/2017  . Heme positive stool    . Acute upper GI bleed 09/06/2017  . HTN (hypertension) 09/06/2017  . Seizure disorder ([place]) 09/06/2017  . Chronic atrial fibrillation ([place]) 09/06/2017  . Thyroid nodule 09/06/2017  . CKD (chronic kidney disease), stage III ([place]) 09/06/2017  . Dyslipidemia 09/06/2017  . COPD (chronic obstructive pulmonary disease) ([place]) 09/06/2017  . CKD (chronic kidney disease) stage 3, GFR 30-59 ml/min (HCC)    . Malignant neoplasm of right upper lobe of lung ([place]) 06/21/2017  . Pulmonary emphysema ([place]) 04/26/2017  . Pleural effusion 04/26/2017  . Near syncope    . Gastroesophageal reflux disease    . Chest pain 04/14/2017  . Lung nodule < 6cm on CT 04/12/2017  . Acute renal failure ([place])    . Sepsis ([place]) 04/06/2017  . Atrial fibrillation with RVR ([place]) 04/06/2017  . Urinary frequency 03/03/2017  . Hemorrhoids 03/03/2017  . Insomnia 03/03/2017  . Postoperative anemia due to acute blood loss 11/24/2016  . Hematoma left thigh 11/24/2016  . Closed femur fracture ([place]) 11/20/2016  . Pedal edema 10/10/2016  . Persistent atrial fibrillation ([place])  12/11/2014  . S/P ablation of atrial fibrillation 12/11/2014  . Orthostatic hypotension 12/11/2014  . Paroxysmal SVT (supraventricular tachycardia) ([place]) 12/11/2014  . Atherosclerotic peripheral vascular disease ([place]) 12/11/2014  . Paroxysmal atrial fibrillation ([place]) 11/05/2014  . Orthostasis 10/03/2014  . Non-compliant behavior 10/01/2014  . TIA (transient ischemic attack) 09/23/2014  . Numbness of left hand    . Acute on chronic diastolic heart failure ([place]) 09/21/2014  . Fracture of femoral neck, right ([place]ochel[place]) 08/28/2014  . Distal radius fracture, right 08/28/2014  . Hip fracture requiring operative repair ([place]) 08/28/2014  . Paroxysmal spells 03/28/2014  . Seizures ([place]) 03/28/2013  . Syncope and collapse 07/27/2012  . Routine health maintenance 12/12/2011  . PERIPHERAL CIRCULATORY DISORDER 04/10/2010  . Chronic diastolic CHF (congestive heart failure) ([place]) 07/17/2009  . Depression 07/04/2009  . Anxiety 01/19/2008  . HLD (hyperlipidemia) 11/14/2007  . Essential hypertension 11/14/2007  . PAROXYSMAL ATRIAL TACHYCARDIA 11/14/2007  . UTERINE PROLAPSE 11/14/2007      Expected Discharge Date: Expected Discharge Date: 10/22/17   Team Members Present: Physician leading conference: Dr. Delice Lesch Social Worker Present: Ovidio Kin, LCSW Nurse Present: Brita Romp, RN PT Present: Michaelene Song, PT OT Present: Willeen Cass, OT SLP Present: Charolett Bumpers, SLP PPS Coordinator present : Daiva Nakayama, RN, CRRN       Current  Status/Progress Goal Weekly Team Focus  Medical     Hemiparesis, aphasia, dysphagia secondary to Left MCA infarct  Improve mobility, safety, dysphagia  See above   Bowel/Bladder     continent and incontinent of b/b, LBM 12/25, incontinent.  pt feels urge to void almost constant and requests to toilet q 15-30 minutes at times, q 4-6 hours pvr's have been  < 350, 12/26 PVR was 400  manage b/b with mod assist, maintain reg bowel pattern.  timed toileting  during waking hours q 2-3 hours.   Swallow/Nutrition/ Hydration     continues to have decreased management of secretions, PO trials limited for pt's safety   Min A   management of secretions, trials of honey thick liquids and/or puree as appropriate for pt's safety    ADL's     Min assist for UB bathing with min to mod for UB dressing.  Mod assist for LB bathing with max assist for dressing.  Mod assist still for stand pivot trnasrers.  Still with pusher tendencies to the right, which were worse on am of 12/24 session.  Still demonstrates right neglect, and motor planning defiicits as well.  Good movement at the right shoulder and elbow but limited in the hand and wrist.  Increased flexor tone in the digits.   min assist overall  seflcare retraining, balance retraining, transfer training, neuromuscular re-education, pt/family education, therapeutic exercise.     Mobility     min-mod bed mobility, min-mod sit to stands and transfers, mod assist gait up to 65 ft without AD  min assist  postural control, midline orientation, family ed, R NMR, gait, transfers   Communication     Mod-max assist for mulitmodal communication   mod A  continue to address communication board and verbal expression    Safety/Cognition/ Behavioral Observations   mod-max assist  Mod assist  Continue to address verbal awareness, sustained attention, basic problem solving    Pain     no c/o pain  pain <2  assess pain q shift and prn   Skin     heels boggy, wound to coccyx is now yellow slough, unstageable, woc consult ordered, gerrhardt cream in use with foam dressing  resolve wound to coccyx with mod assist, no new skin breakdown  prevalon boots for heels, assess skin q shift and prn, question if pt should have air mattress     *See Care Plan and progress notes for long and short-term goals.      Barriers to Discharge   Current Status/Progress Possible Resolutions Date Resolved   Physician     Decreased caregiver  support;Medical stability;Nutrition means;Incontinence     See above  Therapies, follow labs, cont TFs      Nursing                 PT  Inaccessible home environment;Decreased caregiver support;Lack of/limited family support;Home environment access/layout  lived alone prior to admission              OT                 SLP            SW              Discharge Planning/Teaching Needs:  Family deciding if can provide the care pt will need at discharge, wants longer length of stay. Family Conf Friday 10:00      Team Discussion:  Progressing in her therapies, not pushing  as much in therapies. RN timed toileting for continence. Yes/no imprioving along with the communication board.Stood for 2 12 minutes today unassisted. Family Conf Friday with family. Team felt would benefit form discharge extension to 1/5  Revisions to Treatment Plan:  DC 1/5    Continued Need for Acute Rehabilitation Level of Care: The patient requires daily medical management by a physician with specialized training in physical medicine and rehabilitation for the following conditions: Daily direction of a multidisciplinary physical rehabilitation program to ensure safe treatment while eliciting the highest outcome that is of practical value to the patient.: Yes Daily medical management of patient stability for increased activity during participation in an intensive rehabilitation regime.: Yes Daily analysis of laboratory values and/or radiology reports with any subsequent need for medication adjustment of medical intervention for : Neurological problems;Nutritional problems   Charlesa Ehle, Gardiner Rhyme 10/12/2017, 2:05 PM                  Malyah Ohlrich, Gardiner Rhyme, LCSW  Social Worker  Physical Medicine and Rehabilitation  Patient Care Conference  Signed  Date of Service:  10/05/2017  1:36 PM          Signed          [] Hide copied text  [] Hover for details   Inpatient RehabilitationTeam Conference and Plan of  Care Update Date: 10/05/2017   Time: 10:30 AM      Patient Name: Kayla Kent      Medical Record Number: [id number]  Date of Birth: 05-Dec-1931 Sex: Female         Room/Bed: 4M03C/4M03C-01 Payor Info: Payor: MEDICARE / Plan: MEDICARE PART A AND B / Product Type: *No Product type* /     Admitting Diagnosis: L CVA  Admit Date/Time:  09/28/2017  3:20 PM Admission Comments: No comment available    Primary Diagnosis:  Acute ischemic left MCA stroke ([place]) Principal Problem: Acute ischemic left MCA stroke [facility])       Patient Active Problem List    Diagnosis Date Noted  . Atrial fibrillation ([place])    . Acute blood loss anemia    . Dysarthria, post-stroke    . PNA (pneumonia)    . Moderate protein-calorie malnutrition ([place])    . Dysphagia, post-stroke    . Aspiration pneumonia of right lower lobe ([place])    . Stage 3 chronic kidney disease ([place])    . Thrombocytosis ([place])    . PAF (paroxysmal atrial fibrillation) ([place])    . Severe protein-calorie malnutrition ([place])    . Hypokalemia    . Acute respiratory failure with hypoxia ([place]) 09/19/2017  . Dysphagia 09/19/2017  . History of GI bleed    . Acute ischemic left MCA stroke ([place]) 09/16/2017  . Cerebral thrombosis with cerebral infarction 09/13/2017  . Fever    . Aspiration pneumonia due to gastric secretions ([place])    . Leukocytosis    . Acute hypoxemic respiratory failure ([place]) 09/11/2017  . Melena    . Pressure injury of skin 09/09/2017  . Heme positive stool    . Acute upper GI bleed 09/06/2017  . HTN (hypertension) 09/06/2017  . Seizure disorder ([place]) 09/06/2017  . Chronic atrial fibrillation ([place]) 09/06/2017  . Thyroid nodule 09/06/2017  . CKD (chronic kidney disease), stage III ([place]) 09/06/2017  . Dyslipidemia 09/06/2017  . COPD (chronic obstructive pulmonary disease) ([place]) 09/06/2017  . CKD (chronic kidney disease) stage 3, GFR 30-59 ml/min (HCC)    . Malignant neoplasm of right upper lobe  of lung ([place]) 06/21/2017  .  Pulmonary emphysema ([place]) 04/26/2017  . Pleural effusion 04/26/2017  . Near syncope    . Gastroesophageal reflux disease    . Chest pain 04/14/2017  . Lung nodule < 6cm on CT 04/12/2017  . Acute renal failure ([place])    . Sepsis ([place]) 04/06/2017  . Atrial fibrillation with RVR ([place]) 04/06/2017  . Urinary frequency 03/03/2017  . Hemorrhoids 03/03/2017  . Insomnia 03/03/2017  . Postoperative anemia due to acute blood loss 11/24/2016  . Hematoma left thigh 11/24/2016  . Closed femur fracture ([place]) 11/20/2016  . Pedal edema 10/10/2016  . Persistent atrial fibrillation ([place]) 12/11/2014  . S/P ablation of atrial fibrillation 12/11/2014  . Orthostatic hypotension 12/11/2014  . Paroxysmal SVT (supraventricular tachycardia) ([place]) 12/11/2014  . Atherosclerotic peripheral vascular disease ([place]) 12/11/2014  . Paroxysmal atrial fibrillation ([place]) 11/05/2014  . Orthostasis 10/03/2014  . Non-compliant behavior 10/01/2014  . TIA (transient ischemic attack) 09/23/2014  . Numbness of left hand    . Acute on chronic diastolic heart failure ([place]) 09/21/2014  . Fracture of femoral neck, right ([place]) 08/28/2014  . Distal radius fracture, right 08/28/2014  . Hip fracture requiring operative repair ([place]) 08/28/2014  . Paroxysmal spells 03/28/2014  . Seizures ([place]) 03/28/2013  . Syncope and collapse 07/27/2012  . Routine health maintenance 12/12/2011  . PERIPHERAL CIRCULATORY DISORDER 04/10/2010  . Chronic diastolic CHF (congestive heart failure) ([place]) 07/17/2009  . Depression 07/04/2009  . Anxiety 01/19/2008  . HLD (hyperlipidemia) 11/14/2007  . Essential hypertension 11/14/2007  . PAROXYSMAL ATRIAL TACHYCARDIA 11/14/2007  . UTERINE PROLAPSE 11/14/2007      Expected Discharge Date: Expected Discharge Date: 10/17/17   Team Members Present: Physician leading conference: Dr. Delice Lesch Social Worker Present: Ovidio Kin, LCSW Nurse Present: Leonette Nutting, RN PT Present: Michaelene Song, PT OT  Present: Clyda Greener, OT SLP Present: Charolett Bumpers, SLP PPS Coordinator present : Daiva Nakayama, RN, CRRN       Current Status/Progress Goal Weekly Team Focus  Medical     Hemiparesis, aphasia, dysphagia secondary to Left MCA infarct  Improve mobility, safety, dysphagia, ABLA, WBCs, PNA  See above   Bowel/Bladder     incontinent of bowel and bladder LBM 10-03-17  manage bowel and bladder with mod assistanc maintain regular bowel pattern `  time tolieting   Swallow/Nutrition/ Hydration     Total A trials of HTL, oral apraxia unable to execpt PO trials antieror spillage   MIn A  oral care, trials of HTl / puree    ADL's     min assist for UB bathing, mod assist for UB dressing, mod assist for LB bathing with max assist for dressing.  Mod assist for stand pivot transfers.  Still with pusher tendencies to the right in standing.  Brunnstrum stage V in the right arm and elbow but stage I in the wrist and digits.  Max hand over hand to integrate into function.  Motor planning deficits still present as well.  min assist overall  selfcare retraining, balance retraining, transfer training, neuromuscular re-education, pt/family education, therapeutic exercise   Mobility     min assist for bed mobility, min-mod assist squat pivot transfers, mod assist gait up to 60 ft without AD, mod assist for standing balance secondary to R posterior lean.   min assist  postural control, balance, gait, transfers, R NMR, family ed   Communication     Max A to use communication board, Min A id and yes/no questions  Mod A  communication board,    Safety/Cognition/ Behavioral Observations   Mod-Max A, naming, problem solving, id deficits   Mod-Min A  Id deficits, sustained attention, basic probelm solving   Pain     no c/o pain  no c/o pain  Assess pain q shift and prn    Skin     heels boggy, stage 2 bottom, reddness to bottom gerhardtt cream in use,   no new skin issue, resolving stage 2   Assess skin q shift and  prn apply order cream to bottom , allevyn in use     *See Care Plan and progress notes for long and short-term goals.      Barriers to Discharge   Current Status/Progress Possible Resolutions Date Resolved   Physician     Decreased caregiver support;Medical stability;Nutrition means;Incontinence     See above  Therapies, follow labs, cont abx, plan for CXR tomorrow      Nursing                 PT                    OT                 SLP            SW              Discharge Planning/Teaching Needs:  Daughter and son very involved, daughter is local and hoeps to take pt home depends upon her level at discharge.      Team Discussion:  Goals min assist level. Trials of honey and puree with Speech. R-posterior lean and pushes to the right which throws off her balance. Motor planning deficits. R-UE improving. Antibiotics to finish tomorrow and will do chest x-ray and if needed consult pulm MD or chest CT. RN working on timed toileting  Revisions to Treatment Plan:  DC 12/31    Continued Need for Acute Rehabilitation Level of Care: The patient requires daily medical management by a physician with specialized training in physical medicine and rehabilitation for the following conditions: Daily direction of a multidisciplinary physical rehabilitation program to ensure safe treatment while eliciting the highest outcome that is of practical value to the patient.: Yes Daily medical management of patient stability for increased activity during participation in an intensive rehabilitation regime.: Yes Daily analysis of laboratory values and/or radiology reports with any subsequent need for medication adjustment of medical intervention for : Pulmonary problems;Neurological problems;Nutritional problems   Elease Hashimoto 10/05/2017, 1:37 PM

## 2017-10-19 NOTE — Progress Notes (Signed)
Social Work Patient ID: Kayla Kent, female   DOB: 1931/12/24, 84 y.o.   MRN: [id number]  Family and pt have decided that short term NHP is the best option for her and want to pursue [facility] since she has been there before and is familiar with the staff and therapists. Will begin NH process.

## 2017-10-19 NOTE — Plan of Care (Signed)
  Progressing RH BOWEL ELIMINATION RH STG MANAGE BOWEL WITH ASSISTANCE Description STG Manage Bowel with total Assistance.    10/19/2017 1607 - Progressing by Marney Setting, RN RH BLADDER ELIMINATION RH STG MANAGE BLADDER WITH ASSISTANCE Description STG Manage Bladder With total Assistance    10/19/2017 1607 - Progressing by Marney Setting, RN Note Some incontinence due to urgency at night. Urine culture pending.  RH SKIN INTEGRITY RH STG SKIN FREE OF INFECTION/BREAKDOWN Description No new breakdown with total assist/cues.     10/19/2017 1607 - Progressing by Marney Setting, RN RH STG MAINTAIN SKIN INTEGRITY WITH ASSISTANCE Description STG Maintain Skin Integrity With total Assistance.   10/19/2017 1607 - Progressing by Marney Setting, RN RH COGNITION-NURSING RH STG ANTICIPATES NEEDS/CALLS FOR ASSIST W/ASSIST/CUES Description STG Anticipates Needs/Calls for Assist With Mod Assistance/Cues.  10/19/2017 1607 - Progressing by Marney Setting, RN RH PAIN MANAGEMENT RH STG PAIN MANAGED AT OR BELOW PT'S PAIN GOAL Description <3 out of 10.   10/19/2017 1607 - Progressing by Marney Setting, RN

## 2017-10-19 NOTE — Progress Notes (Signed)
Physical Therapy Weekly Progress Note  Patient Details  Name: Kayla Kent MRN: [id number] Date of Birth: [birth year]  Beginning of progress report period: October 12, 2017 End of progress report period: October 19, 2017  Today's Date: 10/19/2017 PT Individual Time: 7471-5953 PT Individual Time Calculation (min): 73 min   Patient has met 3 of 3 short term goals.  Sheritha continues to improve with all functional mobility, demonstrating improve midline and postural control during all dynamic standing activities. Keaira continues to require tactile/verbal cues to address R inattention and encourage use of R UE during sit<>stands, transfers and gait. Sheriece ambulates with CGA using a RW but continues to require verbal cues for turns, obstacle navigation and awareness. Najmah maintains midline during standing activities a majority of the time but will occasionally push right posteriolaterally especially when challenged by activities.   Patient continues to demonstrate the following deficits muscle weakness, motor apraxia, decreased coordination and decreased motor planning and decreased standing balance, decreased postural control, hemiplegia and decreased balance strategies and therefore will continue to benefit from skilled PT intervention to increase functional independence with mobility.  Patient progressing toward long term goals..  Continue plan of care.  PT Short Term Goals Week 2:  PT Short Term Goal 1 (Week 2): Pt will maintain midline in static standing 75% of the time PT Short Term Goal 1 - Progress (Week 2): Met PT Short Term Goal 2 (Week 2): Pt will consistently transfer with min assist PT Short Term Goal 2 - Progress (Week 2): Met PT Short Term Goal 3 (Week 2): Pt will maintain midline during dynamic standing balance activities 50% of the time PT Short Term Goal 3 - Progress (Week 2): Met Week 3:  PT Short Term Goal 1 (Week 3): STG=LTG due to ELOS  Skilled Therapeutic Interventions/Progress  Updates:    Pt sitting in w/c upon PT arrival, agreeable to therapy tx and denies pain. Pt propelled w/c from room>gym using B LEs with supervision. Pt ambulated x 100 ft using RW with R hand orthosis, CGA with verbal cues for increased step length as well as increased cues with turning. Pt worked on standing balance without UE support in order to cpmplete peg board puzzle, pt stood x 5 min with min verbal cues for correct placement of pieces and CGA for standing balance. Pt reported needing to use the bathroom, transported back to room. Pt ambulated from w/c<>into bathroom without AD min assist, mod assist needed to maintain standing balance while pt performed clothing management, increased posterior lean. Pt transported back to room in w/c. Pt ascended/descended 4 steps with L handrail and min assist, verbal cues for techniques, step to pattern. Pt worked on dynamic standing balance without UE support to perform toe taps on colored dots, mirror for visual feedback in order for pt to maintain midline during stepping. Pt worked on dynamic standing balance to reach for horseshoes on L side and throw them, x 2 trials, CGA to maintain balance. Pt ambulated back to room x 120 ft using RW and CGA, verbal cues for turns and obstacles. Pt left supine in bed with needs in reach.   Therapy Documentation Precautions:  Precautions Precautions: Fall Precaution Comments: R hemiplegia, expressive aphasia Restrictions Weight Bearing Restrictions: No   See Function Navigator for Current Functional Status.  Therapy/Group: Individual Therapy  Netta Corrigan, PT, DPT  10/19/2017, 7:42 AM

## 2017-10-19 NOTE — Progress Notes (Signed)
Speech Language Pathology Daily Session Note  Patient Details  Name: Kayla Kent MRN: [id number] Date of Birth: January 07, 1932  Today's Date: 10/19/2017 SLP Individual Time: 8088-1103 SLP Individual Time Calculation (min): 55 min  Short Term Goals: Week 3: SLP Short Term Goal 1 (Week 3): Pt will name familiar objects with min assist multimodal cues to recognize and correct verbal errors.   SLP Short Term Goal 2 (Week 3): Pt will utilize a basic, text based augmented communication system via pointing to convey immediate needs and wants in ~50% of opportunities with supervision cues SLP Short Term Goal 3 (Week 3): Pt will slow rate and increase vocal intensity to achieve intelligibility at the word level for ~75% accuracy with min assist multimodal cues.   SLP Short Term Goal 4 (Week 3): Pt will sustain her attention to basic, familiar tasks for 10 minute intervals with min verbal cues for redirection.   SLP Short Term Goal 5 (Week 3): Pt will complete basic, functional tasks with mod assist for functional problem solving SLP Short Term Goal 6 (Week 3): Pt will consume therapeutic trials of honey thick liquids and purees with minimal overt s/s of aspiration supervision cues for use of swallowing precautions.  Skilled Therapeutic Interventions: Skilled treatment session focused on cognitive-linguistic goals. SLP facilitated session by providing Max A verbal cues for a basic money management task in regards to counting money and making basic change. Patient also participated in a functional conversation about familiar topics such as family, etc and required Mod-Max A verbal cues for use of speech intelligibility strategies to achieve ~75% inteligibility at the phrase level.  Patient transferred back to bed at end of session and left with all needs within reach and alarm on. Continue with current plan of care.      Function:  Cognition Comprehension Comprehension assist level: Understands basic 75 -  89% of the time/ requires cueing 10 - 24% of the time  Expression Expression assistive device: Communication board Expression assist level: Expresses basic 50 - 74% of the time/requires cueing 25 - 49% of the time. Needs to repeat parts of sentences.  Social Interaction Social Interaction assist level: Interacts appropriately 90% of the time - Needs monitoring or encouragement for participation or interaction.  Problem Solving Problem solving assist level: Solves basic 25 - 49% of the time - needs direction more than half the time to initiate, plan or complete simple activities  Memory Memory assist level: Recognizes or recalls 50 - 74% of the time/requires cueing 25 - 49% of the time    Pain No/Denies Pain   Therapy/Group: Individual Therapy  Lynnda Wiersma 10/19/2017, 2:57 PM

## 2017-10-19 NOTE — Plan of Care (Signed)
Treating for unstageable pressure injury Denies any pain Incontinent of bladder

## 2017-10-19 NOTE — Progress Notes (Signed)
Occupational Therapy Session Note  Patient Details  Name: Kayla Kent MRN: [id number] Date of Birth: [birth year]  Today's Date: 10/19/2017 OT Individual Time: 0803-0900 OT Individual Time Calculation (min): 57 min    Short Term Goals: Week 3:  OT Short Term Goal 1 (Week 3): Continue working on established LTGs set at supervision to min A level.  Skilled Therapeutic Interventions/Progress Updates:    Pt completed bathing and dressing during session.  She needed min assist and min instructional cueing for transfer from supine to sit EOB to start session.  She then ambulated from the EOB to the toilet with min assist for toilet transfer.  She was unsuccessful with attempt to void or have a BM, but then transferred over to the walk-in shower.  She needed max demonstrational cueing for sequencing through bathing secondary to apraxic deficits when attempting to manipulate hand held shower or apply soap to the washcloth.  She completed dressing and grooming tasks at the sink from the wheelchair after transferring from the shower bench with min assist.  She was able to donn her pullover shirt with setup assistance for orientation and increased time to thread the RUE.  Mod assist for threading pants over feet with min assist for sit to stand and sustained standing balance in order to pull them up over her hips.  She continues to need max assist for donning socks as well as min assist for donning slip on shoes with elastic laces.  Finished session with grooming tasks of combing hair, brushing teeth, and applying makeup.  Min to mod assist needed for brushing teeth and applying her makeup secondary to not being able to apply it evenly to both sides of her face and not being able to motor plan using the toothbrush accurately.  Pt left with SLP for next session.   Therapy Documentation Precautions:  Precautions Precautions: Fall Precaution Comments: R hemiplegia, expressive aphasia Restrictions Weight  Bearing Restrictions: No  Pain: Pain Assessment Pain Assessment: No/denies pain ADL: See Function Navigator for Current Functional Status.   Therapy/Group: Individual Therapy  Nesanel Aguila OTR/L 10/19/2017, 12:43 PM
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
## 2017-10-19 NOTE — NC FL2 (Signed)
[place] MEDICAID FL2 LEVEL OF CARE SCREENING TOOL     IDENTIFICATION  Patient Name: Kayla Kent Birthdate: [birth year] Sex: female Admission Date (Current Location): 09/28/2017  [facility] and [place] Number:  Herbalist and Address:  The . [facility], [place] [address] [place], [place] [address]      Provider Number: [id number]  Attending Physician Name and Address:  Jamse Arn, MD  Relative Name and Phone Number:  Samantha Crimes [phone]    Current Level of Care: (S) Other (Comment)(rehab) Recommended Level of Care: [place] Prior Approval Number:    Date Approved/Denied:   PASRR Number: [id number]  Discharge Plan: SNF    Current Diagnoses: Patient Active Problem List   Diagnosis Date Noted  . Hyponatremia   . Elevated blood pressure reading   . Gastrointestinal hemorrhage   . Palliative care by specialist   . Palliative care encounter   . Anxiety state   . Urinary retention   . Confusion   . Abnormal chest x-ray   . Hyperglycemia   . Atrial fibrillation ([place])   . Acute blood loss anemia   . Dysarthria, post-stroke   . PNA (pneumonia)   . Moderate protein-calorie malnutrition ([place])   . Dysphagia, post-stroke   . Aspiration pneumonia of right lower lobe ([place])   . Stage 3 chronic kidney disease ([place])   . Thrombocytosis ([place])   . PAF (paroxysmal atrial fibrillation) ([place])   . Severe protein-calorie malnutrition ([place])   . Hypokalemia   . Acute respiratory failure with hypoxia ([place]) 09/19/2017  . Dysphagia 09/19/2017  . History of GI bleed   . Acute ischemic left MCA stroke ([place]) 09/16/2017  . Cerebral thrombosis with cerebral infarction 09/13/2017  . Fever   . Aspiration pneumonia due to gastric secretions ([place])   . Leukocytosis   . Acute hypoxemic respiratory failure ([place]) 09/11/2017  . Melena   . Pressure injury of skin 09/09/2017  . Heme positive stool   . Acute upper GI bleed  09/06/2017  . HTN (hypertension) 09/06/2017  . Seizure disorder ([place]) 09/06/2017  . Chronic atrial fibrillation ([place]) 09/06/2017  . Thyroid nodule 09/06/2017  . CKD (chronic kidney disease), stage III ([place]) 09/06/2017  . Dyslipidemia 09/06/2017  . COPD (chronic obstructive pulmonary disease) ([place]) 09/06/2017  . CKD (chronic kidney disease) stage 3, GFR 30-59 ml/min (HCC)   . Malignant neoplasm of right upper lobe of lung ([place]) 06/21/2017  . Pulmonary emphysema ([employer]) 04/26/2017  . Pleural effusion 04/26/2017  . Near syncope   . Gastroesophageal reflux disease   . Chest pain 04/14/2017  . Lung nodule < 6cm on CT 04/12/2017  . Acute renal failure ([place])   . Sepsis ([place]) 04/06/2017  . Atrial fibrillation with RVR ([place]) 04/06/2017  . Urinary frequency 03/03/2017  . Hemorrhoids 03/03/2017  . Insomnia 03/03/2017  . Postoperative anemia due to acute blood loss 11/24/2016  . Hematoma left thigh 11/24/2016  . Closed femur fracture ([place]) 11/20/2016  . Pedal edema 10/10/2016  . Persistent atrial fibrillation ([place]) 12/11/2014  . S/P ablation of atrial fibrillation 12/11/2014  . Orthostatic hypotension 12/11/2014  . Paroxysmal SVT (supraventricular tachycardia) ([place]) 12/11/2014  . Atherosclerotic peripheral vascular disease ([place]) 12/11/2014  . Paroxysmal atrial fibrillation ([place]) 11/05/2014  . Orthostasis 10/03/2014  . Non-compliant behavior 10/01/2014  . TIA (transient ischemic attack) 09/23/2014  . Numbness of left hand   . Acute on chronic diastolic heart failure ([place]) 09/21/2014  . Fracture of femoral  neck, right ([place]) 08/28/2014  . Distal radius fracture, right 08/28/2014  . Hip fracture requiring operative repair ([place]) 08/28/2014  . Paroxysmal spells 03/28/2014  . Seizures ([place]) 03/28/2013  . Syncope and collapse 07/27/2012  . Routine health maintenance 12/12/2011  . PERIPHERAL CIRCULATORY DISORDER 04/10/2010  . Chronic diastolic CHF (congestive heart failure) ([place]) 07/17/2009  .  Depression 07/04/2009  . Anxiety 01/19/2008  . HLD (hyperlipidemia) 11/14/2007  . Essential hypertension 11/14/2007  . PAROXYSMAL ATRIAL TACHYCARDIA 11/14/2007  . UTERINE PROLAPSE 11/14/2007    Orientation RESPIRATION BLADDER Height & Weight     Self, Time, Situation, Place  Normal Continent Weight: 95 lb 14.4 oz (43.5 kg) Height:  5\' 7"  (170.2 cm)  BEHAVIORAL SYMPTOMS/MOOD NEUROLOGICAL BOWEL NUTRITION STATUS      Continent Feeding tube  AMBULATORY STATUS COMMUNICATION OF NEEDS Skin   Limited Assist Non-Verbally(communication board) PU Stage and Appropriate Care(gerhardtz cream on sacrum)                       Personal Care Assistance Level of Assistance  Bathing, Feeding, Dressing Bathing Assistance: Limited assistance Feeding assistance: Maximum assistance Dressing Assistance: Limited assistance     Functional Limitations Therapist, nutritional, Speech Sight Info: Impaired   Speech Info: Impaired    SPECIAL CARE FACTORS FREQUENCY  PT (By licensed PT), OT (By licensed OT), Speech therapy     PT Frequency: 5x week OT Frequency: 5x week     Speech Therapy Frequency: 5x week      Contractures Contractures Info: Not present    Additional Factors Info  Code Status, Allergies, Isolation Precautions Code Status Info: partial code Allergies Info: choclate, codeine, diazepam, strawberry, wheat     Isolation Precautions Info: ESBL in urine     Current Medications (10/19/2017):  This is the current hospital active medication list Current Facility-Administered Medications  Medication Dose Route Frequency Provider Last Rate Last Dose  . acetaminophen (TYLENOL) solution 500 mg  500 mg Per Tube Q4H PRN Bary Leriche, PA-C   500 mg at 10/17/17 2027  . alum & mag hydroxide-simeth (MAALOX/MYLANTA) 200-200-20 MG/5ML suspension 30 mL  30 mL Per Tube Q4H PRN Love, Pamela S, PA-C      . apixaban (ELIQUIS) tablet 2.5 mg  2.5 mg Per Tube BID Reesa Chew S, PA-C   2.5 mg at 10/19/17 0936   . aspirin chewable tablet 81 mg  81 mg Per Tube Daily Bary Leriche, PA-C   81 mg at 10/19/17 0935  . atorvastatin (LIPITOR) tablet 20 mg  20 mg Per Tube q1800 Bary Leriche, PA-C   20 mg at 10/18/17 1815  . bethanechol (URECHOLINE) tablet 10 mg  10 mg Oral TID Jamse Arn, MD   10 mg at 10/19/17 0936  . bisacodyl (DULCOLAX) suppository 10 mg  10 mg Rectal Daily PRN Love, Pamela S, PA-C      . chlorhexidine (PERIDEX) 0.12 % solution 15 mL  15 mL Mouth Rinse BID Bary Leriche, PA-C   15 mL at 10/19/17 0935  . collagenase (SANTYL) ointment   Topical Daily Jamse Arn, MD      . escitalopram (LEXAPRO) tablet 5 mg  5 mg Oral Daily Jamse Arn, MD   5 mg at 10/19/17 0935  . feeding supplement (OSMOLITE 1.5 CAL) liquid 220 mL  220 mL Per Tube 5 X Daily Bary Leriche, PA-C   220 mL at 10/19/17 0936  . free water 200  mL  200 mL Per Tube Q8H Jamse Arn, MD   200 mL at 10/19/17 0623  . Gerhardt's butt cream   Topical QID Love, Pamela S, PA-C      . guaiFENesin-dextromethorphan (ROBITUSSIN DM) 100-10 MG/5ML syrup 10 mL  10 mL Per Tube TID Meredith Staggers, MD   10 mL at 10/19/17 0935  . ipratropium-albuterol (DUONEB) 0.5-2.5 (3) MG/3ML nebulizer solution 3 mL  3 mL Nebulization Q4H PRN Bary Leriche, PA-C   3 mL at 10/11/17 2007  . lidocaine (XYLOCAINE) 2 % jelly   Topical PRN Love, Pamela S, PA-C      . MEDLINE mouth rinse  15 mL Mouth Rinse q12n4p Bary Leriche, PA-C   15 mL at 10/18/17 1641  . Melatonin TABS 1.5 mg  1.5 mg Oral QHS Jamse Arn, MD   1.5 mg at 10/18/17 2203  . metoprolol tartrate (LOPRESSOR) 25 mg/10 mL oral suspension 25 mg  25 mg Per Tube BID Bary Leriche, PA-C   25 mg at 10/19/17 0935  . ondansetron (ZOFRAN) tablet 4 mg  4 mg Oral Q6H PRN Bary Leriche, PA-C   4 mg at 10/18/17 2201  . pantoprazole sodium (PROTONIX) 40 mg/20 mL oral suspension 40 mg  40 mg Per Tube BID Bary Leriche, PA-C   40 mg at 10/19/17 0935  . simethicone (MYLICON) 40  IW/5.8KD suspension 40 mg  40 mg Per Tube QID PRN Love, Pamela S, PA-C      . sodium chloride (OCEAN) 0.65 % nasal spray 1 spray  1 spray Each Nare PRN Love, Pamela S, PA-C      . sodium phosphate (FLEET) 7-19 GM/118ML enema 1 enema  1 enema Rectal Daily PRN Love, Pamela S, PA-C      . trimethoprim-polymyxin b (POLYTRIM) ophthalmic solution 1 drop  1 drop Right Eye QID Jamse Arn, MD   1 drop at 10/19/17 9833     Discharge Medications: Please see discharge summary for a list of discharge medications.  Relevant Imaging Results:  Relevant Lab Results:   Additional Information SSN:[id number]  Harlen Danford, Gardiner Rhyme, [place]

## 2017-10-19 NOTE — Progress Notes (Signed)
Speech Language Pathology Daily Make-up Session Note  Patient Details  Name: Kayla Kent MRN: [id number] Date of Birth: 12/31/1931  Today's Date: 10/19/2017 SLP Individual Time: 9371-6967 SLP Individual Time Calculation (min): 22 min  Short Term Goals: Week 3: SLP Short Term Goal 1 (Week 3): Pt will name familiar objects with min assist multimodal cues to recognize and correct verbal errors.   SLP Short Term Goal 2 (Week 3): Pt will utilize a basic, text based augmented communication system via pointing to convey immediate needs and wants in ~50% of opportunities with supervision cues SLP Short Term Goal 3 (Week 3): Pt will slow rate and increase vocal intensity to achieve intelligibility at the word level for ~75% accuracy with min assist multimodal cues.   SLP Short Term Goal 4 (Week 3): Pt will sustain her attention to basic, familiar tasks for 10 minute intervals with min verbal cues for redirection.   SLP Short Term Goal 5 (Week 3): Pt will complete basic, functional tasks with mod assist for functional problem solving SLP Short Term Goal 6 (Week 3): Pt will consume therapeutic trials of honey thick liquids and purees with minimal overt s/s of aspiration supervision cues for use of swallowing precautions.  Skilled Therapeutic Interventions: Skilled treatment session focused on dysphagia goals. SLP facilitated session by initiating pharyngeal strengthening exercises. Patient performed the chin tuck against resistance with Min-Mod A verbal cues for accuracy with positioning. However, patient able to initiate a violation swallow in 100% of opportunities with extra time. Educated patient on performing exercise at least 5-10 times throughout the day. She verbalized understanding. Patient left upright in wheelchair with all needs within reach and quick release belt in place. Continue with current plan of care.      Function:   Cognition Comprehension Comprehension assist level: Understands  basic 75 - 89% of the time/ requires cueing 10 - 24% of the time  Expression   Expression assist level: Expresses basic 50 - 74% of the time/requires cueing 25 - 49% of the time. Needs to repeat parts of sentences.  Social Interaction Social Interaction assist level: Interacts appropriately 90% of the time - Needs monitoring or encouragement for participation or interaction.  Problem Solving Problem solving assist level: Solves basic 25 - 49% of the time - needs direction more than half the time to initiate, plan or complete simple activities  Memory Memory assist level: Recognizes or recalls 50 - 74% of the time/requires cueing 25 - 49% of the time    Pain No/Denies Pain   Therapy/Group: Individual Therapy  [name] 10/19/2017, 9:24 AM

## 2017-10-19 NOTE — Progress Notes (Signed)
[facility] PHYSICAL MEDICINE & REHABILITATION     PROGRESS NOTE  Subjective/Complaints:  Pt seen laying in bed this AM.  She slept well overnight.  Yesterday, her urine was noted to have foul odor.  ROS: Denies CP, SOB, N/V/D.  Objective: Vital Signs: Blood pressure (!) 135/56, pulse 78, temperature 98.1 F (36.7 C), temperature source Oral, resp. rate 14, height 5\' 7"  (1.702 m), weight 43.5 kg (95 lb 14.4 oz), SpO2 96 %. No results found. Recent Labs    10/17/17 0923  WBC 11.5*  HGB 11.8*  HCT 38.3  PLT 342   Recent Labs    10/17/17 0923  NA 130*  K 3.9  CL 95*  GLUCOSE 168*  BUN 21*  CREATININE 0.81  CALCIUM 9.0   CBG (last 3)  Recent Labs    10/16/17 1635 10/16/17 2204 10/17/17 0612  GLUCAP 98 65 78    Wt Readings from Last 3 Encounters:  10/19/17 43.5 kg (95 lb 14.4 oz)  09/28/17 46.4 kg (102 lb 4.7 oz)  09/19/17 49.8 kg (109 lb 12.6 oz)    Physical Exam:  BP (!) 135/56   Pulse 78   Temp 98.1 F (36.7 C) (Oral)   Resp 14   Ht 5\' 7"  (1.702 m)   Wt 43.5 kg (95 lb 14.4 oz)   SpO2 96%   BMI 15.02 kg/m  Constitutional: She appears well-developed. She appears cachectic. NAD.  HENT: Normocephalic and atraumatic.  Eyes: No discharge. Dysconjugate gaze with right eye in lateral field (stable).  Cardiovascular: Irregularly irregular without JVD. Respiratory: Upper airway sounds. Unlabored. GI: Bowel sounds are normal. She exhibits no distension. +PEG. Musculoskeletal: She exhibits no edema or tenderness in extremities.  Neurological: She is alert. Right facial droop Severe dysarthria, slowly improving.  Able  to follow simple one step motor commands.  Motor: RUE: 4/5 shoulder abduction, 4-/5 elbow flex/ext, 1/5 wrist extension, 1+/5 hand grip (unchanged) No increase in tone noted. RLE: 4+/5 proximal to distal (stable) Skin: Skin is warm and dry. She is not diaphoretic.  Psychiatric: She is flat.  Assessment/Plan: 1. Functional deficits secondary  to left MCA infarct which require 3+ hours per day of interdisciplinary therapy in a comprehensive inpatient rehab setting. Physiatrist is providing close team supervision and 24 hour management of active medical problems listed below. Physiatrist and rehab team continue to assess barriers to discharge/monitor patient progress toward functional and medical goals.  Function:  Bathing Bathing position   Position: Shower  Bathing parts Body parts bathed by patient: Right arm, Chest, Abdomen, Right upper leg, Left upper leg, Right lower leg, Front perineal area, Left lower leg, Left arm(used long sponge to reach left arm with mod cues) Body parts bathed by helper: Back, Buttocks  Bathing assist Assist Level: Touching or steadying assistance(Pt > 75%)      Upper Body Dressing/Undressing Upper body dressing   What is the patient wearing?: Pull over shirt/dress     Pull over shirt/dress - Perfomed by patient: Thread/unthread left sleeve, Put head through opening, Pull shirt over trunk Pull over shirt/dress - Perfomed by helper: Thread/unthread right sleeve        Upper body assist Assist Level: Touching or steadying assistance(Pt > 75%)      Lower Body Dressing/Undressing Lower body dressing   What is the patient wearing?: Pants, Shoes, Socks     Pants- Performed by patient: Pull pants up/down, Thread/unthread right pants leg Pants- Performed by helper: Thread/unthread left pants leg   Non-skid  slipper socks- Performed by helper: Don/doff right sock, Don/doff left sock Socks - Performed by patient: Don/doff left sock Socks - Performed by helper: Don/doff right sock, Don/doff left sock Shoes - Performed by patient: Don/doff left shoe Shoes - Performed by helper: Don/doff right shoe          Lower body assist Assist for lower body dressing: Touching or steadying assistance (Pt > 75%)      Toileting Toileting Toileting activity did not occur: No continent bowel/bladder  event Toileting steps completed by patient: Performs perineal hygiene Toileting steps completed by helper: Adjust clothing prior to toileting, Adjust clothing after toileting Toileting Assistive Devices: Grab bar or rail  Toileting assist Assist level: Touching or steadying assistance (Pt.75%)   Transfers Chair/bed transfer   Chair/bed transfer method: Stand pivot, Ambulatory Chair/bed transfer assist level: Touching or steadying assistance (Pt > 75%) Chair/bed transfer assistive device: Armrests     Locomotion Ambulation Ambulation activity did not occur: Safety/medical concerns   Max distance: 125' Assist level: Touching or steadying assistance (Pt > 75%)   Wheelchair   Type: Manual Max wheelchair distance: 100' Assist Level: Supervision or verbal cues  Cognition Comprehension Comprehension assist level: Understands basic 75 - 89% of the time/ requires cueing 10 - 24% of the time  Expression Expression assist level: Expresses basic 50 - 74% of the time/requires cueing 25 - 49% of the time. Needs to repeat parts of sentences.  Social Interaction Social Interaction assist level: Interacts appropriately 90% of the time - Needs monitoring or encouragement for participation or interaction.  Problem Solving Problem solving assist level: Solves basic 25 - 49% of the time - needs direction more than half the time to initiate, plan or complete simple activities  Memory Memory assist level: Recognizes or recalls 50 - 74% of the time/requires cueing 25 - 49% of the time    Medical Problem List and Plan: 1.  Hemiparesis, aphasia, dysphagia secondary to Left MCA infarct.   Continue CIR   WHO    Palliative consult completed - plan for SNF 2.  DVT Prophylaxis/Anticoagulation: Pharmaceutical: Other (comment)--Eliquis 3. Pain Management: Tylenol prn  4. Mood: LCSW to follow for evaluation and support.    Lexapro started on 12/27 5. Neuropsych: This patient is not fully capable of making  decisions on her own behalf. 6. Skin/Wound Care: Pressure relief measures.Maintain adequate hydration/nutritional status.  7. Fluids/Electrolytes/Nutrition: Adjust tube feeds to maintain adequate nutritional status. Consulted dietitian for input and adjustment.    BMP within acceptable range on 12/24   TFs adjusted again on 12/31 8. RUL/RLL aspiration PNA   Scheduled Robitussin-DM to help with cough and secretions    Repeat chest x-ray reviewed, showing persistent right upper lobe disease, but resolution of right lower lobe infiltrate. Per Pulm, pro calcitonin ordered, reviewed, WNL, antibiotics DC'd. No need for further imaging at this time. Follow-up CT in 3 months   Repeat chest x-ray reviewed, showing stable abnormality.   Continue to monitor 9. ?CKD stage III: Monitor renal status with routine checks. Water flushes via PEG.    Creatinine 0.81 on 12/31   Labs pending   Appears to be controlled with adequate hydration 10 Recent GIB: Back on Eliquis.    Hemoglobin 11.8 on 12/31   Labs pending   Continue to monitor 11. Seizure disorder: stable/controlled on Keppra. 12. A Fib: Monitor HR bid. Continue metoprolol and decrease ASA to 81 mg/day.    Heart rate controlled on 12/31 13. Thrombocytosis: Resolved  Likely reactive.    Will continue to monitor.  14. Dysphagia: NPO (holding bite trials with ST), tolerating tube feeds 15. Protein calorie malnutrition: Chronic with baseline albumin around 2.5 in the past 6 months.    Pre-albumin 16.9 on 12/13.    Continue to monitor 16. COPD (per X ray)/RUL bronchogenic lung CA: completed XRT 06/2017 17. Hypokalemia: Resolved   Continue to monitor 18. Leukocytosis: Resolved  Cont to monitor 19. Hyperglycemia   Secondary to tube feeds, resolved with bolus feeds, CBGs d/ced on 12/31 20. Urinary frequency  Appears to be retaining now, Ditropan DC'd   Will consider further meds tomorrow   Bethanechol 10 started on 12/28   ?Improving 21.  Elevated blood pressure  Overall controlled on 1/2 22. Hyponatremia  Na 130 on 12/31  Labs pending   Cont to monitor 23. Leukocytosis  WBCs 11.5 on 12/31  Labs pending   UA/Ucx pending  Afebrile   Cont to monitor   LOS (Days) 21 A FACE TO FACE EVALUATION WAS PERFORMED  Nayeliz Hipp Lorie Phenix 10/19/2017 8:11 AM

## 2017-10-19 NOTE — Patient Care Conference (Signed)
Inpatient RehabilitationTeam Conference and Plan of Care Update Date: 10/19/2017   Time: 11:00 AM    Patient Name: Kayla Kent      Medical Record Number: [id number]  Date of Birth: [birth year] Sex: Female         Room/Bed: 4M03C/4M03C-01 Payor Info: Payor: MEDICARE / Plan: MEDICARE PART A AND B / Product Type: *No Product type* /    Admitting Diagnosis: L CVA  Admit Date/Time:  09/28/2017  3:20 PM Admission Comments: No comment available   Primary Diagnosis:  Acute ischemic left MCA stroke ([place]) Principal Problem: Acute ischemic left MCA stroke [facility])  Patient Active Problem List   Diagnosis Date Noted  . Hyponatremia   . Elevated blood pressure reading   . Gastrointestinal hemorrhage   . Palliative care by specialist   . Palliative care encounter   . Anxiety state   . Urinary retention   . Confusion   . Abnormal chest x-ray   . Hyperglycemia   . Atrial fibrillation ([place])   . Acute blood loss anemia   . Dysarthria, post-stroke   . PNA (pneumonia)   . Moderate protein-calorie malnutrition ([place])   . Dysphagia, post-stroke   . Aspiration pneumonia of right lower lobe ([place])   . Stage 3 chronic kidney disease ([place])   . Thrombocytosis ([place])   . PAF (paroxysmal atrial fibrillation) ([place])   . Severe protein-calorie malnutrition ([place])   . Hypokalemia   . Acute respiratory failure with hypoxia ([place]) 09/19/2017  . Dysphagia 09/19/2017  . History of GI bleed   . Acute ischemic left MCA stroke ([place]) 09/16/2017  . Cerebral thrombosis with cerebral infarction 09/13/2017  . Fever   . Aspiration pneumonia due to gastric secretions ([place])   . Leukocytosis   . Acute hypoxemic respiratory failure ([place]) 09/11/2017  . Melena   . Pressure injury of skin 09/09/2017  . Heme positive stool   . Acute upper GI bleed 09/06/2017  . HTN (hypertension) 09/06/2017  . Seizure disorder ([place]) 09/06/2017  . Chronic atrial fibrillation ([place]) 09/06/2017  . Thyroid nodule 09/06/2017  . CKD (chronic  kidney disease), stage III ([place]) 09/06/2017  . Dyslipidemia 09/06/2017  . COPD (chronic obstructive pulmonary disease) ([place]) 09/06/2017  . CKD (chronic kidney disease) stage 3, GFR 30-59 ml/min (HCC)   . Malignant neoplasm of right upper lobe of lung ([place]) 06/21/2017  . Pulmonary emphysema ([place]) 04/26/2017  . Pleural effusion 04/26/2017  . Near syncope   . Gastroesophageal reflux disease   . Chest pain 04/14/2017  . Lung nodule < 6cm on CT 04/12/2017  . Acute renal failure ([place])   . Sepsis ([place]) 04/06/2017  . Atrial fibrillation with RVR ([place]) 04/06/2017  . Urinary frequency 03/03/2017  . Hemorrhoids 03/03/2017  . Insomnia 03/03/2017  . Postoperative anemia due to acute blood loss 11/24/2016  . Hematoma left thigh 11/24/2016  . Closed femur fracture ([place]) 11/20/2016  . Pedal edema 10/10/2016  . Persistent atrial fibrillation ([place]) 12/11/2014  . S/P ablation of atrial fibrillation 12/11/2014  . Orthostatic hypotension 12/11/2014  . Paroxysmal SVT (supraventricular tachycardia) ([place]) 12/11/2014  . Atherosclerotic peripheral vascular disease ([place]) 12/11/2014  . Paroxysmal atrial fibrillation ([place]) 11/05/2014  . Orthostasis 10/03/2014  . Non-compliant behavior 10/01/2014  . TIA (transient ischemic attack) 09/23/2014  . Numbness of left hand   . Acute on chronic diastolic heart failure ([place]) 09/21/2014  . Fracture of femoral neck, right ([place]) 08/28/2014  . Distal radius fracture, right 08/28/2014  . Hip fracture requiring  operative repair ([place]) 08/28/2014  . Paroxysmal spells 03/28/2014  . Seizures ([place]) 03/28/2013  . Syncope and collapse 07/27/2012  . Routine health maintenance 12/12/2011  . PERIPHERAL CIRCULATORY DISORDER 04/10/2010  . Chronic diastolic CHF (congestive heart failure) ([place]) 07/17/2009  . Depression 07/04/2009  . Anxiety 01/19/2008  . HLD (hyperlipidemia) 11/14/2007  . Essential hypertension 11/14/2007  . PAROXYSMAL ATRIAL TACHYCARDIA 11/14/2007  . UTERINE  PROLAPSE 11/14/2007    Expected Discharge Date: Expected Discharge Date: 10/22/17  Team Members Present: Physician leading conference: Dr. Delice Lesch Social Worker Present: Ovidio Kin, LCSW Nurse Present: Leonette Nutting, RN PT Present: Michaelene Song, PT OT Present: Clyda Greener, OT SLP Present: Windell Moulding, SLP PPS Coordinator present : Daiva Nakayama, RN, CRRN     Current Status/Progress Goal Weekly Team Focus  Medical   Hemiparesis, aphasia, dysphagia secondary to Left MCA infarct  Improve mobility, safety, dysphagia, bladder symptoms  See above   Bowel/Bladder   Continent of bowel and bladder during day; urinary frequency; on urecholine for retention; LBM 12/31  Mod I assist  Assess and treat for constipation as needed; monitor PVRs for retention; timed toileting if patient incontinent   Swallow/Nutrition/ Hydration   improved management of her secretions, NPO with PEG per repeat MBS last week  min assist   pharyngeal strengthening exercises, trials of honey thck liquids via teaspoon and purees    ADL's   Min assist for all bathing sit to stand with max demonstrational cueing for sequencing secondary to motor planning.  Supervision to min assist for UB dressing with mod assist for LB dressing.  Still with decreased right hand function.  Needs max assist for integration into ADL tasks  min assist overall  selfcare retraining, balance retraining, transfer training, neuromuscular re-education, pt/family education, therapeutic exercise.     Mobility   supevision bed mobility and w/c mobility 150', min assist transfers and gait, gait 100-150' w/ or w/o AD min assist for balance, L pushing increases w/ fatigue towards end of session or end of ambulation bouts   supervision to min assist overall   postural control, pushing, NMR, quality of gait, independence w/ transfers, OOB tolerance, family edu if they decide to take pt home   Communication   min-mod assist   min assist (upgraded)   intelligibility with verbal expression, awareness of verbal errors, supplement as needed with communication board    Safety/Cognition/ Behavioral Observations  min-mod assist   min assist (upgraded)  problem solving and awareness/correction of motor planning errors    Pain   Denies pain  < 3  Assess and treat for pain q shift and prn   Skin   Heels boggy- prevalon boots; unstageable to sacrum- santyl daily; MASD peri area- Gerhardts  Mod assist  Assess skin q shift and prn; perform dressing changes per MD order      *See Care Plan and progress notes for long and short-term goals.     Barriers to Discharge  Current Status/Progress Possible Resolutions Date Resolved   Physician    Decreased caregiver support;Medical stability;Nutrition means;Incontinence;Lack of/limited family support     See above  Therapies, follow labs, cont TFs - now bolus, bladder workup      Nursing                  PT  Inaccessible home environment;Decreased caregiver support;Lack of/limited family support;Home environment access/layout;Nutrition means  lives alone, family may not be able to provide 24/7 physical assistance, pt requires feeding via  PEG at this time               OT                  SLP                SW                Discharge Planning/Teaching Needs:    Pt and family have decided short term NHP is best option for pt for more rehab and longer time to recover     Team Discussion:  Making steady progress in therapies, some goals have been upgraded to min-supervision level. Still having motor planning and sequencing issues, along with apraxia. Palliative consult completed with pt and family. Trials of po per speech. MD checking UA for UTI. Work on [employer]  Revisions to Treatment Plan:  NHP    Continued Need for Acute Rehabilitation Level of Care: The patient requires daily medical management by a physician with specialized training in physical medicine and rehabilitation for the following  conditions: Daily direction of a multidisciplinary physical rehabilitation program to ensure safe treatment while eliciting the highest outcome that is of practical value to the patient.: Yes Daily medical management of patient stability for increased activity during participation in an intensive rehabilitation regime.: Yes Daily analysis of laboratory values and/or radiology reports with any subsequent need for medication adjustment of medical intervention for : Neurological problems;Nutritional problems;Urological problems  Evva Din, Gardiner Rhyme 10/19/2017, 1:28 PM
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
## 2017-10-20 ENCOUNTER — Inpatient Hospital Stay (HOSPITAL_COMMUNITY): Payer: Self-pay | Admitting: Occupational Therapy

## 2017-10-20 ENCOUNTER — Other Ambulatory Visit: Payer: Self-pay

## 2017-10-20 ENCOUNTER — Inpatient Hospital Stay (HOSPITAL_COMMUNITY): Payer: Self-pay | Admitting: Physical Therapy

## 2017-10-20 ENCOUNTER — Inpatient Hospital Stay (HOSPITAL_COMMUNITY): Payer: Self-pay | Admitting: Speech Pathology

## 2017-10-20 DIAGNOSIS — I482 Chronic atrial fibrillation: Secondary | ICD-10-CM

## 2017-10-20 DIAGNOSIS — R079 Chest pain, unspecified: Secondary | ICD-10-CM

## 2017-10-20 DIAGNOSIS — N3000 Acute cystitis without hematuria: Secondary | ICD-10-CM

## 2017-10-20 LAB — BASIC METABOLIC PANEL
Anion gap: 7 (ref 5–15)
BUN: 21 mg/dL — ABNORMAL HIGH (ref 6–20)
CALCIUM: 8.4 mg/dL — AB (ref 8.9–10.3)
CHLORIDE: 95 mmol/L — AB (ref 101–111)
CO2: 28 mmol/L (ref 22–32)
CREATININE: 0.7 mg/dL (ref 0.44–1.00)
Glucose, Bld: 106 mg/dL — ABNORMAL HIGH (ref 65–99)
Potassium: 4.9 mmol/L (ref 3.5–5.1)
SODIUM: 130 mmol/L — AB (ref 135–145)

## 2017-10-20 LAB — CBC WITH DIFFERENTIAL/PLATELET
BASOS ABS: 0 10*3/uL (ref 0.0–0.1)
BASOS PCT: 0 %
Eosinophils Absolute: 0 10*3/uL (ref 0.0–0.7)
Eosinophils Relative: 1 %
HCT: 36 % (ref 36.0–46.0)
HEMOGLOBIN: 11.2 g/dL — AB (ref 12.0–15.0)
Lymphocytes Relative: 21 %
Lymphs Abs: 1.5 10*3/uL (ref 0.7–4.0)
MCH: 30.4 pg (ref 26.0–34.0)
MCHC: 31.1 g/dL (ref 30.0–36.0)
MCV: 97.6 fL (ref 78.0–100.0)
Monocytes Absolute: 0.6 10*3/uL (ref 0.1–1.0)
Monocytes Relative: 9 %
NEUTROS PCT: 69 %
Neutro Abs: 4.8 10*3/uL (ref 1.7–7.7)
PLATELETS: 305 10*3/uL (ref 150–400)
RBC: 3.69 MIL/uL — AB (ref 3.87–5.11)
RDW: 15.3 % (ref 11.5–15.5)
WBC: 7 10*3/uL (ref 4.0–10.5)

## 2017-10-20 LAB — URINE CULTURE

## 2017-10-20 MED ORDER — NITROFURANTOIN MONOHYD MACRO 100 MG PO CAPS
100.0000 mg | ORAL_CAPSULE | Freq: Two times a day (BID) | ORAL | Status: DC
  Administered 2017-10-20 (×2): 100 mg via ORAL
  Filled 2017-10-20 (×2): qty 1

## 2017-10-20 MED ORDER — POLYETHYLENE GLYCOL 3350 17 G PO PACK
17.0000 g | PACK | ORAL | Status: DC | PRN
  Administered 2017-10-20: 17 g
  Filled 2017-10-20: qty 1

## 2017-10-20 NOTE — Progress Notes (Signed)
[facility] [place] PHYSICAL MEDICINE & REHABILITATION     PROGRESS NOTE  Subjective/Complaints:  Appreciate cardiology note +burning with urination ROS: Denies CP, SOB, N/V/D.  Objective: Vital Signs: Blood pressure (!) 131/39, pulse 82, temperature 97.8 F (36.6 C), temperature source Oral, resp. rate 14, height 5\' 7"  (1.702 m), weight 43.9 kg (96 lb 12.5 oz), SpO2 100 %. No results found. Recent Labs    10/19/17 1020 10/20/17 1413  WBC 9.2 7.0  HGB 11.5* 11.2*  HCT 37.7 36.0  PLT 323 305   Recent Labs    10/19/17 1020 10/20/17 1413  NA 131* 130*  K 4.4 4.9  CL 96* 95*  GLUCOSE 124* 106*  BUN 19 21*  CREATININE 0.80 0.70  CALCIUM 8.4* 8.4*   CBG (last 3)  No results for input(s): GLUCAP in the last 72 hours.  Wt Readings from Last 3 Encounters:  10/20/17 43.9 kg (96 lb 12.5 oz)  09/28/17 46.4 kg (102 lb 4.7 oz)  09/19/17 49.8 kg (109 lb 12.6 oz)    Physical Exam:  BP (!) 131/39 (BP Location: Left Arm)   Pulse 82   Temp 97.8 F (36.6 C) (Oral)   Resp 14   Ht 5\' 7"  (1.702 m)   Wt 43.9 kg (96 lb 12.5 oz)   SpO2 100%   BMI 15.16 kg/m  Constitutional: She appears well-developed. She appears cachectic. NAD.  HENT: Normocephalic and atraumatic.  Eyes: No discharge. Dysconjugate gaze with right eye in lateral field (stable).  Cardiovascular: Irregularly irregular without JVD. Respiratory: Upper airway sounds. Unlabored. GI: Bowel sounds are normal. She exhibits no distension. +PEG. Musculoskeletal: She exhibits no edema or tenderness in extremities.  Neurological: She is alert. Right facial droop Severe dysarthria,  Able  to follow simple one step motor commands.  Motor: RUE: 4/5 shoulder abduction, 4-/5 elbow flex/ext, 1/5 wrist extension, 1+/5 hand grip (unchanged) No increase in tone noted. RLE: 4+/5 proximal to distal (stable) Skin: Skin is warm and dry. She is not diaphoretic.  Psychiatric: She is flat.  Assessment/Plan: 1. Functional deficits secondary  to left MCA infarct which require 3+ hours per day of interdisciplinary therapy in a comprehensive inpatient rehab setting. Physiatrist is providing close team supervision and 24 hour management of active medical problems listed below. Physiatrist and rehab team continue to assess barriers to discharge/monitor patient progress toward functional and medical goals.  Function:  Bathing Bathing position   Position: Shower  Bathing parts Body parts bathed by patient: Chest, Abdomen, Right upper leg, Left upper leg, Right lower leg, Front perineal area, Left lower leg, Buttocks, Right arm Body parts bathed by helper: Back, Left arm  Bathing assist Assist Level: Touching or steadying assistance(Pt > 75%)      Upper Body Dressing/Undressing Upper body dressing   What is the patient wearing?: Pull over shirt/dress     Pull over shirt/dress - Perfomed by patient: Thread/unthread left sleeve, Put head through opening, Pull shirt over trunk, Thread/unthread right sleeve Pull over shirt/dress - Perfomed by helper: Thread/unthread right sleeve        Upper body assist Assist Level: Touching or steadying assistance(Pt > 75%)      Lower Body Dressing/Undressing Lower body dressing   What is the patient wearing?: Pants, Shoes, Socks     Pants- Performed by patient: Thread/unthread right pants leg, Pull pants up/down Pants- Performed by helper: Thread/unthread left pants leg   Non-skid slipper socks- Performed by helper: Don/doff right sock, Don/doff left sock Socks - Performed  by patient: Don/doff left sock Socks - Performed by helper: Don/doff right sock, Don/doff left sock Shoes - Performed by patient: Don/doff left shoe, Don/doff right shoe Shoes - Performed by helper: Don/doff right shoe          Lower body assist Assist for lower body dressing: Touching or steadying assistance (Pt > 75%)      Toileting Toileting Toileting activity did not occur: No continent bowel/bladder  event Toileting steps completed by patient: Performs perineal hygiene, Adjust clothing prior to toileting, Adjust clothing after toileting Toileting steps completed by helper: Adjust clothing prior to toileting, Adjust clothing after toileting Toileting Assistive Devices: Grab bar or rail  Toileting assist Assist level: Touching or steadying assistance (Pt.75%)   Transfers Chair/bed transfer   Chair/bed transfer method: Ambulatory Chair/bed transfer assist level: Moderate assist (Pt 50 - 74%/lift or lower) Chair/bed transfer assistive device: Armrests, Medical sales representative Ambulation activity did not occur: Safety/medical concerns   Max distance: 150' Assist level: Touching or steadying assistance (Pt > 75%)   Wheelchair   Type: Manual Max wheelchair distance: 100' Assist Level: Supervision or verbal cues  Cognition Comprehension Comprehension assist level: Understands basic 75 - 89% of the time/ requires cueing 10 - 24% of the time  Expression Expression assist level: Expresses basic 50 - 74% of the time/requires cueing 25 - 49% of the time. Needs to repeat parts of sentences.  Social Interaction Social Interaction assist level: Interacts appropriately 75 - 89% of the time - Needs redirection for appropriate language or to initiate interaction.  Problem Solving Problem solving assist level: Solves basic 25 - 49% of the time - needs direction more than half the time to initiate, plan or complete simple activities  Memory Memory assist level: Recognizes or recalls 25 - 49% of the time/requires cueing 50 - 75% of the time    Medical Problem List and Plan: 1.  Hemiparesis, aphasia, dysphagia secondary to Left MCA infarct.   Continue CIR   WHO    Stable for SNF in am 2.  DVT Prophylaxis/Anticoagulation: Pharmaceutical: Other (comment)--Eliquis 3. Pain Management: Tylenol prn  4. Mood: LCSW to follow for evaluation and support.    Lexapro started on 12/27 5. Neuropsych:  This patient is not fully capable of making decisions on her own behalf. 6. Skin/Wound Care: Pressure relief measures.Maintain adequate hydration/nutritional status.  7. Fluids/Electrolytes/Nutrition: Adjust tube feeds to maintain adequate nutritional status. Consulted dietitian for input and adjustment.    BMP within acceptable range on 12/24   TFs adjusted again on 12/31 8. RUL/RLL aspiration PNA   Scheduled Robitussin-DM to help with cough and secretions    Repeat chest x-ray reviewed, showing persistent right upper lobe disease, but resolution of right lower lobe infiltrate. Per Pulm, pro calcitonin ordered, reviewed, WNL, antibiotics DC'd. No need for further imaging at this time. Follow-up CT in 3 months   Repeat chest x-ray reviewed, showing stable abnormality.   Continue to monitor 9. ?CKD stage III: Monitor renal status with routine checks. Water flushes via PEG.    Creatinine 0.81 on 12/31   Labs pending   Appears to be controlled with adequate hydration 10 Recent GIB: Back on Eliquis.    Hemoglobin 11.8 on 12/31   Labs pending   Continue to monitor 11. Seizure disorder: stable/controlled on Keppra. 12. A Fib: Monitor HR bid. Continue metoprolol and decrease ASA to 81 mg/day.    Heart rate controlled on 1/3 13. Thrombocytosis: Resolved  Likely reactive.    Will continue to monitor.  14. Dysphagia: NPO (holding bite trials with ST), tolerating tube feeds 15. Protein calorie malnutrition: Chronic with baseline albumin around 2.5 in the past 6 months.    Pre-albumin 16.9 on 12/13.    Continue to monitor 16. COPD (per X ray)/RUL bronchogenic lung CA: completed XRT 06/2017 17. Hypokalemia: Resolved   Continue to monitor 18. Leukocytosis: Resolved  Cont to monitor 19. Hyperglycemia   Secondary to tube feeds, resolved with bolus feeds, CBGs d/ced on 12/31 20. Urinary frequency  Appears to be retaining now, Ditropan DC'd   Will consider further meds tomorrow   Bethanechol 10  started on 12/28   ?Improving 21. Elevated blood pressure  Overall controlled on 1/2 22. Hyponatremia  Na 130 on 12/31  Labs pending   Cont to monitor 23. Leukocytosis  WBCs 11.5 on 12/31  Labs pending   UA/TNTC WBCs, Cx pnd, start empiric nitrofurantoin   LOS (Days) 22 A FACE TO FACE EVALUATION WAS PERFORMED  Charlett Blake 10/20/2017 4:28 PM

## 2017-10-20 NOTE — Progress Notes (Signed)
Physical Therapy Session Note  Patient Details  Name: Kayla Kent MRN: [id number] Date of Birth: June 17, 1932  Today's Date: 10/20/2017 PT Individual Time: 1000-1111 PT Individual Time Calculation (min): 71 min   Short Term Goals: Week 3:  PT Short Term Goal 1 (Week 3): STG=LTG due to ELOS  Skilled Therapeutic Interventions/Progress Updates:   Pt in care of NT, agreeable to therapy, no c/o pain. Pt sitting EOB and PT provided total assist to don shoes. Ambulated to/from therapy gym w/ min guard using RE and RUE orthosis. Verbal and tactile cues for pt to manage UE in orthosis independently before and after ambulating. Focused on standing NMR this session while performing tasks in standing w/ forced use of LLE for weight-bearing to decrease L pushing. Performed toe taps to 3 in step w/ and w/o R HHA. Min assist for balance w/o UE support. Participated in cognitive task in static stance, RLE placed on 3" step to force use of LLE. 3 bouts of 5 min standing w/ min guard for balance, no LOB laterally during task. Performed dynamic gait tasks to simulate home environment including scanning environment for objects and obstacle negotiation, picking up objects w/ LUE, and uneven surfaces. Mod cues for RW management, otherwise min guard for safety. Ambulated in 100-150' bouts during dynamic gait. Returned to room and ended sesison in supine, call bell within reach and all needs met.   Therapy Documentation Precautions:  Precautions Precautions: Fall Precaution Comments: R hemiplegia, expressive aphasia Restrictions Weight Bearing Restrictions: No Vital Signs: Therapy Vitals Pulse Rate: 75 BP: (!) 125/41 Pain: Pain Assessment Pain Assessment: No/denies pain  See Function Navigator for Current Functional Status.   Therapy/Group: Individual Therapy  Mal Asher K Arnette 10/20/2017, 12:05 PM

## 2017-10-20 NOTE — Progress Notes (Signed)
Occupational Therapy Session Note  Patient Details  Name: ABBIGAEL DETLEFSEN MRN: [id number] Date of Birth: 1932/08/16  Today's Date: 10/20/2017 OT Individual Time: 1300-1325 OT Individual Time Calculation (min): 25 min    Short Term Goals: Week 3:  OT Short Term Goal 1 (Week 3): Continue working on established LTGs set at supervision to min A level.  Skilled Therapeutic Interventions/Progress Updates:    Pt seen for limited treatment secondary to medical issues.  She was able to transfer from supine to sit EOB with min assist.  Min assist for sit to stand with mod assist for ambulation to the shower bench, before nursing requested return to the bed secondary to PA recommendation.  Mod assist for transfer to the bed secondary to increased lean and LOB to the right with use of the RW.  She was able to complete transfer to supine with min assist for lifting LEs into the bed.  Pt left with PA and nursing in room at end of session.    Therapy Documentation Precautions:  Precautions Precautions: Fall Precaution Comments: R hemiplegia, expressive aphasia Restrictions Weight Bearing Restrictions: No General: General OT Amount of Missed Time: 35 Minutes  Pain: Pain Assessment Pain Assessment: No/denies pain ADL: See Function Navigator for Current Functional Status.   Therapy/Group: Individual Therapy  Evin Chirco OTR/L 10/20/2017, 1:38 PM

## 2017-10-20 NOTE — Consult Note (Signed)
Cardiology Consultation:   Patient ID: Kayla Kent; [id number]; 03-31-1932   Admit date: 09/28/2017 Date of Consult: 10/20/2017  Primary Care Provider: Biagio Borg, MD Primary Cardiologist: Dr. Dorris Carnes    Patient Profile:   Kayla Kent is an 84 y.o. female with a hx of HTN, CKD stage 3, PAF, PFO, seizure disorder, RUL lung cancer s/p radiation, CVA (MCA ischemic stroke), currently in inpatient rehab following a complicated hospital course from 11/20-11/30/2018, as well as 12/3-12/09/2017, who is being seen today for the evaluation of atrial flutter at the request of Dr. Posey Pronto.  History of Present Illness:   Kayla Kent is an 84 y.o. female with a hx of HTN, Chronic diastolic HF, CKD, PAF, PFO, seizure disorder, RUL lung cancer s/p radiation, and anxiety who presented to [facility] ED on 09/06/17 after being found down at her home with increase confusion and agitation. Patient was hospitalized from 11/20-11/30/2018 where she was found to have an UGIB s/p EGD revealing gastritis. Also found to have ESBL UTI with sepsis. Hospital course complicated by CVA (MCA ischemic stroke) 09/11/17, likely 2/2 discontinuation of her eliquis in the setting of acute UGIB and plans for procedures, for which she underwent mechanical thrombectomy by IR. Course additionally complicated by aspiration PNA requiring short term intubation for respiratory failure. Patient was transferred to inpatient rehab on 09/16/17. Unfortunately patient was readmitted to [facility] 09/19/17 for a 2nd episode of aspiration PNA. MBS with minimal improvement in swallowing and decision made to place PEG tube for nutritional support.  Patient was seen by Cardiology 09/07/17 for evaluation of Afib RVR in the setting of urosepsis. Managed with amiodarone gtt and transitioned to metoprolol for rate control. Eliquis was restarted once cleared by Neurology.  Patient states she has been doing well and improving over the past several weeks at  inpatient rehab. After speaking with SW this afternoon, patient was informed she was ready to be transferred to a SNF tomorrow. After this discussion, patient noted right sided lower chest pain which radiated up to right shoulder. She states the pain came on a rest, was associated with SOB, diaphoresis, and nausea (no vomiting). Pain lasted for a few minutes and resolved spontaneously. Patient states she has never experienced chest pain before. States she felt as thought her heart was racing like with previous episodes of Afib.   Daughter at bedside states patient has a long history of anxiety, previously managed with xanax prior to this hospital stay. She states patient has a difficulty time with change and believes the CP could be attributed to plans to discharge her to a SNF.   Past Medical History:  Diagnosis Date  . Allergic rhinitis   . Anxiety   . CKD (chronic kidney disease) stage 3, GFR 30-59 ml/min (HCC)   . Emphysema of lung ([place])   . HLD (hyperlipidemia)   . HTN (hypertension)   . Intolerance of drug    orthostatic  . Paroxysmal supraventricular tachycardia ([place])   . PVD (peripheral vascular disease) ([place])   . Seizure ([place])   . Syncope and collapse   . Uterine prolapse without mention of vaginal wall prolapse     Past Surgical History:  Procedure Laterality Date  . CARDIOVERSION N/A 11/05/2014   Procedure: CARDIOVERSION;  Surgeon: Candee Furbish, MD;  Location: [facility] ENDOSCOPY;  Service: Cardiovascular;  Laterality: N/A;  . cataract surgery  08/2015  . CHOLECYSTECTOMY    . corrective eye surgery     as a child  .  ESOPHAGOGASTRODUODENOSCOPY N/A 09/09/2017   Procedure: ESOPHAGOGASTRODUODENOSCOPY (EGD);  Surgeon: Mauri Pole, MD;  Location: [facility] ENDOSCOPY;  Service: Endoscopy;  Laterality: N/A;  . INTRAMEDULLARY (IM) NAIL INTERTROCHANTERIC Left 11/21/2016   Procedure: INTRAMEDULLARY (IM) NAIL INTERTROCHANTRIC HEMI;  Surgeon: Newt Minion, MD;  Location: [place];  Service:  Orthopedics;  Laterality: Left;  . IR GASTROSTOMY TUBE MOD SED  09/26/2017  . IR PERCUTANEOUS ART THROMBECTOMY/INFUSION INTRACRANIAL INC DIAG ANGIO  09/11/2017  . IVD removed    . OPEN REDUCTION INTERNAL FIXATION (ORIF) DISTAL RADIAL FRACTURE Right 08/29/2014   Procedure: OPEN REDUCTION INTERNAL FIXATION (ORIF) DISTAL RADIAL FRACTURE;  Surgeon: Marianna Payment, MD;  Location: [place];  Service: Orthopedics;  Laterality: Right;  . RADIOLOGY WITH ANESTHESIA N/A 09/11/2017   Procedure: RADIOLOGY WITH ANESTHESIA;  Surgeon: Luanne Bras, MD;  Location: [facility];  Service: Radiology;  Laterality: N/A;  . RF ablation PSVT     summer '10  . stress cardiolite  08/05/93  . TONSILLECTOMY    . TOTAL HIP ARTHROPLASTY Right 08/29/2014   Procedure: Right Hip Hemi Arthroplasty;  Surgeon: Marianna Payment, MD;  Location: [place];  Service: Orthopedics;  Laterality: Right;  Hip procedure 1st wants Peg Board, [employer], [place].        Inpatient Medications: Scheduled Meds: . apixaban  2.5 mg Per Tube BID  . aspirin  81 mg Per Tube Daily  . atorvastatin  20 mg Per Tube q1800  . bethanechol  10 mg Oral TID  . chlorhexidine  15 mL Mouth Rinse BID  . collagenase   Topical Daily  . escitalopram  5 mg Oral Daily  . feeding supplement (OSMOLITE 1.5 CAL)  220 mL Per Tube 5 X Daily  . feeding supplement (PRO-STAT SUGAR FREE 64)  30 mL Per Tube Daily  . free water  200 mL Per Tube Q8H  . Gerhardt's butt cream   Topical QID  . guaiFENesin-dextromethorphan  10 mL Per Tube TID  . mouth rinse  15 mL Mouth Rinse q12n4p  . Melatonin  1.5 mg Oral QHS  . metoprolol tartrate  25 mg Per Tube BID  . nitrofurantoin (macrocrystal-monohydrate)  100 mg Oral Q12H  . pantoprazole sodium  40 mg Per Tube BID  . trimethoprim-polymyxin b  1 drop Right Eye QID   Continuous Infusions:  PRN Meds: acetaminophen (TYLENOL) oral liquid 160 mg/5 mL, alum & mag hydroxide-simeth, bisacodyl, ipratropium-albuterol,  lidocaine, ondansetron, polyethylene glycol, simethicone, sodium chloride, sodium phosphate  Allergies:    Allergies  Allergen Reactions  . Chocolate Hives  . Codeine Other (See Comments)    Patient states she acts crazy  . Diazepam Other (See Comments)    Patient states she acts crazy.  . Fruit & Vegetable Daily [Nutritional Supplements] Hives and Swelling    peaches  . Iohexol Hives     Code: HIVES, Desc: pt gets 13 hr pre-meds, Onset Date: [id number]   . Latex Hives  . Peach [Prunus Persica] Hives  . Peanut-Containing Drug Products Hives and Swelling  . Penicillins Hives, Itching and Swelling    Has patient had a PCN reaction causing immediate rash, facial/tongue/throat swelling, SOB or lightheadedness with hypotension: Yes Has patient had a PCN reaction causing severe rash involving mucus membranes or skin necrosis: No Has patient had a PCN reaction that required hospitalization No Has patient had a PCN reaction occurring within the last 10 years: No If all of the above answers are "NO", then may proceed with  Cephalosporin use.   . Strawberry Extract Hives  . Sulfonamide Derivatives Hives    nausea  . Wheat     Daughter states this is an error, that she is not allergic to wheat products  . Wheat Bran     Patient family states this is an error- she is not allergic to wheat products  . Sulfamethoxazole Hives  . Crestor [Rosuvastatin Calcium] Rash  . Iodine Rash    Social History:   Social History   Socioeconomic History  . Marital status: Widowed    Spouse name: Not on file  . Number of children: 2  . Years of education: 49  . Highest education level: Not on file  Social Needs  . Financial resource strain: Not on file  . Food insecurity - worry: Not on file  . Food insecurity - inability: Not on file  . Transportation needs - medical: Not on file  . Transportation needs - non-medical: Not on file  Occupational History  . Occupation: retired Radio producer  Tobacco  Use  . Smoking status: Former Smoker    Packs/day: 0.25    Years: 38.00    Pack years: 9.50    Start date: 02/15/1952    Last attempt to quit: 10/18/1993    Years since quitting: 24.0  . Smokeless tobacco: Never Used  Substance and Sexual Activity  . Alcohol use: No    Alcohol/week: 0.0 oz  . Drug use: No  . Sexual activity: No  Other Topics Concern  . Not on file  Social History Narrative   [employer], [employer]-BA, [employer]-early childhood. Married '55 -40 years.  1 son - 58; 1 daughter - 82; 3 grandchildren . Lives alone. ACP - discussed and provided packet on end of life care (Feb '13)   Patient is now widowed.   Patient is right-handed.   Patient drinks tea daily.      [facility] (04/26/17):   Originally from [facility]. Previously was a Pharmacist, hospital. No pets currently. No mold exposure. No bird exposure.     Family History:    Family History  Problem Relation Age of Onset  . Mental illness Father        suicide  . Arthritis Father   . Hyperlipidemia Sister   . Hypertension Sister   . Heart disease Brother        CAD/MI  . Hypertension Brother   . Hypertension Unknown        family hx  . Colon cancer Neg Hx   . Breast cancer Neg Hx   . Diabetes Neg Hx   . Stroke Neg Hx   . Cancer Neg Hx   . Lung disease Neg Hx      ROS:  Please see the history of present illness.   All other ROS reviewed and negative.     Physical Exam/Data:   Vitals:   10/20/17 0921 10/20/17 1330 10/20/17 1347 10/20/17 1359  BP: (!) 125/41 (!) 152/56 (!) 142/67 (!) 144/60  Pulse: 75 85    Resp:   14 17  Temp:   98.1 F (36.7 C) 98 F (36.7 C)  TempSrc:  Oral Oral Oral  SpO2:  99%  100%  Weight:      Height:       No intake or output data in the 24 hours ending 10/20/17 1401 Filed Weights   10/18/17 0418 10/19/17 0235 10/20/17 0500  Weight: 94 lb 5.7 oz (42.8 kg) 95 lb 14.4 oz (43.5 kg)  96 lb 12.5 oz (43.9 kg)   Body mass index is 15.16 kg/m.  General:  Elderly thin female  laying in bed in NAD HEENT: sclera anicteric  Lymph: no adenopathy Neck: no JVD Vascular: No carotid bruits; distal pulses 2+ bilaterally Cardiac:  Irregularly irregular; no murmur, gallops, or rubs; no chest wall TTP Lungs:  clear to auscultation bilaterally, no wheezing, rhonchi or rales  Abd: NABS, soft, nontender, no hepatomegaly Ext: no edema Musculoskeletal:  No deformities, BUE and BLE strength normal and equal Skin: warm and dry  Neuro:  Right sided facial drooping with right sided hemiparesis Psych:  Difficult to assess given aphasia  EKG:  The EKG was personally reviewed and demonstrates:  Atrial fibrillation, rate 88, non-ischemic   Relevant CV Studies: ECHO 09/20/17: Study Conclusions  - Left ventricle: The cavity size was normal. Wall thickness was   increased in a pattern of mild LVH. Systolic function was normal.   The estimated ejection fraction was in the range of 60% to 65%.   Wall motion was normal; there were no regional wall motion   abnormalities. - Mitral valve: There was mild regurgitation. - Left atrium: The atrium was severely dilated. - Right atrium: The atrium was mildly dilated. - Pulmonary arteries: Systolic pressure was mildly increased. PA   peak pressure: 33 mm Hg (S).  Nuclear Stress Test 04/22/17: Probable normal perfusion and mild soft tissue attenuation No significant ischemia. Images not gated due to atrial fibrillation. There are no significant changes in comparison to the prior study.   Laboratory Data:  Chemistry Recent Labs  Lab 10/17/17 0923 10/19/17 1020  NA 130* 131*  K 3.9 4.4  CL 95* 96*  CO2 26 26  GLUCOSE 168* 124*  BUN 21* 19  CREATININE 0.81 0.80  CALCIUM 9.0 8.4*  GFRNONAA >60 >60  GFRAA >60 >60  ANIONGAP 9 9    No results for input(s): PROT, ALBUMIN, AST, ALT, ALKPHOS, BILITOT in the last 168 hours. Hematology Recent Labs  Lab 10/17/17 0923 10/19/17 1020  WBC 11.5* 9.2  RBC 3.91 3.82*  HGB 11.8* 11.5*   HCT 38.3 37.7  MCV 98.0 98.7  MCH 30.2 30.1  MCHC 30.8 30.5  RDW 15.4 15.5  PLT 342 323   Cardiac EnzymesNo results for input(s): TROPONINI in the last 168 hours. No results for input(s): TROPIPOC in the last 168 hours.  BNPNo results for input(s): BNP, PROBNP in the last 168 hours.  DDimer No results for input(s): DDIMER in the last 168 hours.  Radiology/Studies:  No results found.  Assessment and Plan:   1. CP: Likely 2/2 anxiety. Pain atypical - right sided, started at rest after hearing she would be transferred to SNF tomorrow, resolved spontaneously after a few minutes.  - EKG with atrial fibrillation, rate 88, non-ischemic - No need to obtain troponin as this is not cardiac in origin - No indication for ischemic work-up - Okay to stop O2 via [place]  2. Atrial fibrillation: currently rate controlled - Continue metoprolol and eliquis (patient is high risk for recurrent CVA if anticoagulation interrupted)  3. HLD - Continue statin   From a cardiology standpoint, patient is stable for transfer to SNF.    For questions or updates, please contact [place] Please consult [email or web address] for contact info under Cardiology/STEMI.   Signed, Abigail Butts, PA-C  10/20/2017 2:01 PM [phone]  Patient examined chart reviewed. Pain free now. Exam with frail elderly female Left sided hemiplegia with facial droop and  more LLE weakness than UE. Lungs clear no murmur Peg tube. Afib is chronic and rate controlled ECG is normal  She had a normal myovue July of 2018 No need for further w/u Ok to go to SNF in am  [employer]

## 2017-10-20 NOTE — Progress Notes (Signed)
Speech Language Pathology Daily Session Note  Patient Details  Name: Kayla Kent MRN: [id number] Date of Birth: 1932-05-22  Today's Date: 10/20/2017 SLP Individual Time: 0809-0905 SLP Individual Time Calculation (min): 56 min  Short Term Goals: Week 3: SLP Short Term Goal 1 (Week 3): Pt will name familiar objects with min assist multimodal cues to recognize and correct verbal errors.   SLP Short Term Goal 2 (Week 3): Pt will utilize a basic, text based augmented communication system via pointing to convey immediate needs and wants in ~50% of opportunities with supervision cues SLP Short Term Goal 3 (Week 3): Pt will slow rate and increase vocal intensity to achieve intelligibility at the word level for ~75% accuracy with min assist multimodal cues.   SLP Short Term Goal 4 (Week 3): Pt will sustain her attention to basic, familiar tasks for 10 minute intervals with min verbal cues for redirection.   SLP Short Term Goal 5 (Week 3): Pt will complete basic, functional tasks with mod assist for functional problem solving SLP Short Term Goal 6 (Week 3): Pt will consume therapeutic trials of honey thick liquids and purees with minimal overt s/s of aspiration supervision cues for use of swallowing precautions.  Skilled Therapeutic Interventions:  Pt was seen for skilled ST targeting goals for dysphagia and communication.  Pt was in bed upon arrival but agreeable to getting up for therapies.  Pt brushed her hair and teeth with mod assist verbal cues to recognize and correct apraxic errors.  Pt consumed therapeutic trials of honey thick liquids with no overt s/s of aspiration but had difficulty containing liquids anteriorly when attempting to use chin tuck.  Pt could complete x10 repetitions of the chin tuck against resistance exercise with supervision instructional cues.  Pt could identify objects from a grocery store flyer that fit in a targeted category with mod I for 100% accuracy but needed mod assist  multimodal cues for intelligibility and correction of verbal errors.  Pt was returned to room and left in wheelchair and call bell within reach.  Continue per current plan of care.     Function:  Eating Eating   Modified Consistency Diet: No Eating Assist Level: Helper feeds patient(trials of honey thick liquids via teaspoon)           Cognition Comprehension Comprehension assist level: Understands basic 75 - 89% of the time/ requires cueing 10 - 24% of the time  Expression   Expression assist level: Expresses basic 50 - 74% of the time/requires cueing 25 - 49% of the time. Needs to repeat parts of sentences.  Social Interaction Social Interaction assist level: Interacts appropriately 90% of the time - Needs monitoring or encouragement for participation or interaction.  Problem Solving Problem solving assist level: Solves basic 25 - 49% of the time - needs direction more than half the time to initiate, plan or complete simple activities  Memory Memory assist level: Recognizes or recalls 50 - 74% of the time/requires cueing 25 - 49% of the time    Pain Pain Assessment Pain Assessment: No/denies pain  Therapy/Group: Individual Therapy  Kirby Cortese, Selinda Orion 10/20/2017, 12:21 PM

## 2017-10-20 NOTE — Progress Notes (Signed)
Social Work Patient ID: Kayla Kent, female   DOB: [birth year], 84 y.o.   MRN: [id number]  Spoke with [facility] Admission Coordinator whohas offered pt a bed for tomorrow. Have contacted daughter to inform and she ill make appointment with [facility] for tomorrow. Have met with pt to inform at which time she informed worker she could not breathe. Got RN-Chelsey to check pt and pt reports can't breathe and her chest is tight. Pam-PA made aware also. Will work on transfer for tomorrow around 2:00 pm.

## 2017-10-20 NOTE — Progress Notes (Signed)
Pt called to room by Education officer, museum. Pt complains of feeling chest tightness and shortness of breath. PA Reesa Chew notified. EKG ordered. B/p is 142/52 and pulse is 86. O2 is 95. Pt given 2L O2 [place] per PA. EKG results given to [facility]. RN is instructed to have patient rest and continue vitals q15 minutes pending consult from cardiology. Will continue to monitor patient closely.

## 2017-10-20 NOTE — Progress Notes (Addendum)
Patient received news of discharge to SNF. Few minutes later patient called with fluttering in chest. EKG done showing A flutter. Heart rate and BP controlled. Patient lying in bed NAD. heart IRIR with rate about 80 bpm.  Denies CP or SOB but reports feeling funny. Reports that she has similar episodes that resolve with rest. Check labs to rule out electrolyte abnormality. Cardiology contacted and will evaluate patient for input check vitals every 15 minutes X 4 then every hour if symptoms improve.

## 2017-10-21 ENCOUNTER — Inpatient Hospital Stay (HOSPITAL_COMMUNITY): Payer: Self-pay | Admitting: Physical Therapy

## 2017-10-21 ENCOUNTER — Inpatient Hospital Stay (HOSPITAL_COMMUNITY): Payer: Medicare Other

## 2017-10-21 ENCOUNTER — Inpatient Hospital Stay (HOSPITAL_COMMUNITY): Payer: Self-pay | Admitting: Occupational Therapy

## 2017-10-21 ENCOUNTER — Inpatient Hospital Stay (HOSPITAL_COMMUNITY): Payer: Self-pay | Admitting: Speech Pathology

## 2017-10-21 DIAGNOSIS — K5901 Slow transit constipation: Secondary | ICD-10-CM

## 2017-10-21 DIAGNOSIS — R0789 Other chest pain: Secondary | ICD-10-CM

## 2017-10-21 MED ORDER — METOCLOPRAMIDE HCL 5 MG/ML IJ SOLN
5.0000 mg | Freq: Three times a day (TID) | INTRAMUSCULAR | Status: DC
  Administered 2017-10-21 – 2017-10-22 (×4): 5 mg via INTRAVENOUS
  Filled 2017-10-21 (×4): qty 2

## 2017-10-21 MED ORDER — POTASSIUM CHLORIDE IN NACL 20-0.9 MEQ/L-% IV SOLN
INTRAVENOUS | Status: DC
  Administered 2017-10-21: 07:00:00 via INTRAVENOUS
  Filled 2017-10-21 (×3): qty 1000

## 2017-10-21 MED ORDER — SORBITOL 70 % SOLN
960.0000 mL | TOPICAL_OIL | Freq: Once | ORAL | Status: AC
  Administered 2017-10-21: 960 mL via RECTAL
  Filled 2017-10-21: qty 473

## 2017-10-21 MED ORDER — POLYETHYLENE GLYCOL 3350 17 G PO PACK
17.0000 g | PACK | Freq: Two times a day (BID) | ORAL | Status: DC
  Administered 2017-10-21 – 2017-10-24 (×7): 17 g
  Filled 2017-10-21 (×5): qty 1

## 2017-10-21 NOTE — Progress Notes (Signed)
Occupational Therapy Weekly Progress Note  Patient Details  Name: Kayla Kent MRN: [id number] Date of Birth: 1932/01/24  Beginning of progress report period: October 14, 2017 End of progress report period: October 21, 2017  Today's Date: 10/21/2017 OT Individual Time: 0800-0900 OT Individual Time Calculation (min): 60 min    Patient continues to make steady progress with OT but overall completes ADLs at a min to mod assist level.  She continues to demonstrate moderate motor planning deficits during all selfcare tasks making sequencing and with BUE use for example when attempting to brush her teeth, apply make-up, or attempt to apply soap to her washcloth.  Mod assist is needed for these tasks.  She continues to demonstrate RUE weakness as well.  She continues to demonstrate full AROM at the right shoulder and elbow but demonstrates only Brunnstrum stage II in the wrist and hand, with only trace digit flexion noted.  She continues to need max assist to integrate the LUE into selfcare tasks with hand over hand functional use.  Left neglect is also still present as she will leave the RUE hanging off of the side of the chair or get it stuck under the sink when attempting to use it, without awareness this is happening.  Functional transfers are at a min assist level, but pt needs max demonstrational cueing for sequencing transfers, step length, and placement of the UEs.  Overall progress remains steady but at this point pt will need long term rehab in order to reach a supervision level.  Plan is for SNF placement as soon as pt is medically stable.  Will continue with current OT POC until expected discharge next week.    Patient continues to demonstrate the following deficits: muscle weakness, impaired timing and sequencing, motor apraxia, decreased coordination and decreased motor planning, decreased midline orientation, decreased attention to right, right side neglect and decreased motor planning,  decreased awareness, decreased problem solving, decreased memory and delayed processing and decreased sitting balance, decreased standing balance, decreased postural control, hemiplegia and decreased balance strategies and therefore will continue to benefit from skilled OT intervention to enhance overall performance with BADL and Reduce care partner burden.  Patient progressing toward long term goals..  Continue plan of care.  OT Short Term Goals Week 4:  OT Short Term Goal 1 (Week 4): Continue working on established LTGs set at supervision to min A level.  Skilled Therapeutic Interventions/Progress Updates:    Pt completed bathing and dressing sit to stand at the sink during session, secondary to being hooked up to the IV.  She was able to complete UB bathing and dressing with min assist.  LB bathing at min assist as well with use of a LH sponge for washing the LEs and feet.  She needed mod assist for threading pants as well as shoes, with max assist for donning socks.  Continue to attempt use of the reacher to assist with this, but pt demonstrates significant motor planning deficits requiring max assist for successful use.  Completed grooming tasks of brushing her hair with supervision but she needed mod assist for applying makeup evenly and brushing oral care.  Finished session with call button and phone in reach and pt at bedside with safety belt in place.    Therapy Documentation Precautions:  Precautions Precautions: Fall Precaution Comments: R hemiplegia, expressive aphasia Restrictions Weight Bearing Restrictions: No  Pain: Pain Assessment Pain Assessment: No/denies pain ADL: See Function Navigator for Current Functional Status.   Therapy/Group: Individual Therapy  Regan Mcbryar OTR/L 10/21/2017, 12:25 PM

## 2017-10-21 NOTE — Progress Notes (Signed)
Secondary nurse reported that pt had 500 ml's of feed color and odor emesis with some thick sputum mixed in with emesis. Patient denies any further nausea states did not feel sick prior to vomiting. Pt's lungs have bilateral rhonchi present however there is no respiratory distress noted or increase of respiratory effort. Pt remains pale and warm and dry. Spoke with Algis Liming and new orders obtained.

## 2017-10-21 NOTE — Progress Notes (Signed)
Speech Language Pathology Discharge Summary  Patient Details  Name: MERISSA RENWICK MRN: [id number] Date of Birth: November 07, 1931    Patient has met 6 of 9 long term goals.  Patient to discharge at [facility] level.   Clinical Impression/Discharge Summary:  Pt has made excellent gains while inpatient and is discharging having met 6 out of 9 long term goals.  Pt is able to use a basic communication board with supervision and can verbally convey needs and wants with min-mod assist to recognize and correct verbal errors and to achieve intelligibility at the word/phrase level.  Pt currently needs mod-max assist for problem solving due to motor planning deficits.  She remains NPO with PEG but has shown great improvement in her ability to manage secretions and orally contain boluses and initiate a swallow during trials of honey thick liquids via teaspoon and purees.  Pt is discharging to SNF where it is recommended that she receive ongoing ST interventions to maximize functional independence and reduce burden of care prior to discharge.     Care Partner:  Caregiver Able to Provide Assistance: Other (comment)(SNF)  Type of Caregiver Assistance: (SNF)  Recommendation:  Skilled Nursing facility;24 hour supervision/assistance;Home Health SLP;Outpatient SLP  Rationale for SLP Follow Up: Maximize functional communication;Reduce caregiver burden;Maximize cognitive function and independence;Maximize swallowing safety   Equipment: none recommended by SLP    Reasons for discharge: Discharged from hospital   Patient/Family Agrees with Progress Made and Goals Achieved: Yes    Timmie Calix, Selinda Orion 10/21/2017, 5:02 PM

## 2017-10-21 NOTE — Telephone Encounter (Signed)
lmtcb x2 for pt. 

## 2017-10-21 NOTE — Progress Notes (Signed)
Progress Note  Patient Name: Kayla Kent Date of Encounter: 10/21/2017  Primary Cardiologist: Dorris Carnes  Subjective   Stomach upset last night with vomiting no chest pain   Inpatient Medications    Scheduled Meds: . apixaban  2.5 mg Per Tube BID  . aspirin  81 mg Per Tube Daily  . atorvastatin  20 mg Per Tube q1800  . bethanechol  10 mg Oral TID  . chlorhexidine  15 mL Mouth Rinse BID  . collagenase   Topical Daily  . escitalopram  5 mg Oral Daily  . feeding supplement (OSMOLITE 1.5 CAL)  220 mL Per Tube 5 X Daily  . feeding supplement (PRO-STAT SUGAR FREE 64)  30 mL Per Tube Daily  . free water  200 mL Per Tube Q8H  . Gerhardt's butt cream   Topical QID  . guaiFENesin-dextromethorphan  10 mL Per Tube TID  . mouth rinse  15 mL Mouth Rinse q12n4p  . Melatonin  1.5 mg Oral QHS  . metoCLOPramide (REGLAN) injection  5 mg Intravenous Q8H  . metoprolol tartrate  25 mg Per Tube BID  . pantoprazole sodium  40 mg Per Tube BID  . sorbitol, milk of mag, mineral oil, glycerin (SMOG) enema  960 mL Rectal Once  . trimethoprim-polymyxin b  1 drop Right Eye QID   Continuous Infusions: . 0.9 % NaCl with KCl 20 mEq / L 75 mL/hr at 10/21/17 0657   PRN Meds: acetaminophen (TYLENOL) oral liquid 160 mg/5 mL, alum & mag hydroxide-simeth, bisacodyl, ipratropium-albuterol, lidocaine, ondansetron, polyethylene glycol, simethicone, sodium chloride, sodium phosphate   Vital Signs    Vitals:   10/20/17 1415 10/20/17 1430 10/20/17 1449 10/21/17 0445  BP: (!) 149/48 (!) 149/49 (!) 131/39 (!) 108/40  Pulse: 88 80 82 (!) 120  Resp: 14 14 14 16   Temp: (!) 97.5 F (36.4 C) 97.6 F (36.4 C) 97.8 F (36.6 C) 98.2 F (36.8 C)  TempSrc: Oral Oral Oral Oral  SpO2: 100% 100% 100% 97%  Weight:    94 lb 12.8 oz (43 kg)  Height:       No intake or output data in the 24 hours ending 10/21/17 0803 Filed Weights   10/19/17 0235 10/20/17 0500 10/21/17 0445  Weight: 95 lb 14.4 oz (43.5 kg) 96 lb  12.5 oz (43.9 kg) 94 lb 12.8 oz (43 kg)    Telemetry    afib rates 80's  - Personally Reviewed  ECG    afib no acute ST changes  - Personally Reviewed  Physical Exam  L MCA stroke with facial droop and LLE >LUE weakness  GEN: No acute distress.   Neck: No JVD Cardiac: RRR, no murmurs, rubs, or gallops.  Respiratory: Clear to auscultation bilaterally. GI: Soft, nontender, non-distended PEG tube  MS: No edema; No deformity. Psych: Normal affect   Labs    Chemistry Recent Labs  Lab 10/17/17 0923 10/19/17 1020 10/20/17 1413  NA 130* 131* 130*  K 3.9 4.4 4.9  CL 95* 96* 95*  CO2 26 26 28   GLUCOSE 168* 124* 106*  BUN 21* 19 21*  CREATININE 0.81 0.80 0.70  CALCIUM 9.0 8.4* 8.4*  GFRNONAA >60 >60 >60  GFRAA >60 >60 >60  ANIONGAP 9 9 7      Hematology Recent Labs  Lab 10/17/17 0923 10/19/17 1020 10/20/17 1413  WBC 11.5* 9.2 7.0  RBC 3.91 3.82* 3.69*  HGB 11.8* 11.5* 11.2*  HCT 38.3 37.7 36.0  MCV 98.0 98.7 97.6  MCH 30.2 30.1 30.4  MCHC 30.8 30.5 31.1  RDW 15.4 15.5 15.3  PLT 342 323 305    Cardiac EnzymesNo results for input(s): TROPONINI in the last 168 hours. No results for input(s): TROPIPOC in the last 168 hours.   BNPNo results for input(s): BNP, PROBNP in the last 168 hours.   DDimer No results for input(s): DDIMER in the last 168 hours.   Radiology    Dg Chest Port 1 View  Result Date: 10/21/2017 CLINICAL DATA:  Possible aspiration with vomiting for 2 hours. EXAM: PORTABLE CHEST 1 VIEW COMPARISON:  10/09/2017 FINDINGS: Cardiac enlargement. Mild vascular congestion. Diffuse interstitial pattern likely representing edema. Interstitial pneumonia could also have this appearance. Interstitial changes are progressing since previous study. There is persistent area of ovoid opacity in the right upper lung without change. This could represent focal pneumonia or mass lesion. Emphysematous changes in the lungs. Calcification of the aorta. IMPRESSION:  Emphysematous changes in the lungs. Cardiac enlargement with developing interstitial edema. Persistent pneumonia versus mass lesion in the right upper lung without change. Electronically Signed   By: Lucienne Capers M.D.   On: 10/21/2017 03:11   Dg Abd Portable 1v  Result Date: 10/21/2017 CLINICAL DATA:  Vomiting EXAM: PORTABLE ABDOMEN - 1 VIEW COMPARISON:  None. FINDINGS: Gastrostomy tube projected over the mid abdomen. Gas and stool throughout the colon. Residual contrast material in the rectosigmoid colon. No small or large bowel distention. Changes likely to represent ileus. Surgical clips in the right upper quadrant. No radiopaque stones. Postoperative changes in both hips. IMPRESSION: Nonobstructing bowel gas pattern. Scattered gas may represent ileus. Electronically Signed   By: Lucienne Capers M.D.   On: 10/21/2017 03:12    Cardiac Studies   ECHO 09/20/17: Study Conclusions  - Left ventricle: The cavity size was normal. Wall thickness was increased in a pattern of mild LVH. Systolic function was normal. The estimated ejection fraction was in the range of 60% to 65%. Wall motion was normal; there were no regional wall motion abnormalities. - Mitral valve: There was mild regurgitation. - Left atrium: The atrium was severely dilated. - Right atrium: The atrium was mildly dilated. - Pulmonary arteries: Systolic pressure was mildly increased. PA peak pressure: 33 mm Hg (S).  Nuclear Stress Test 04/22/17: Probable normal perfusion and mild soft tissue attenuation No significant ischemia. Images not gated due to atrial fibrillation. There are no significant changes in comparison to the prior study.    Patient Profile     84 y.o. female with recent left MCA stroke , atrial fibrillation and atypical chest pain related To anxiety  Assessment & Plan    Chest Pain: resolved related to anxiety about going to SNF. R/o no acute ECG changes Low risk myovue July 2018 no  further w/u indicated ok to go to SNF   Afib:  Rate control is good continue metoprolol and eliqus  GI:  Has PEG tube soft per rehab   For questions or updates, please contact [facility] Please consult [email or web address] for contact info under Cardiology/STEMI.      Signed, Jenkins Rouge, MD  10/21/2017, 8:03 AM

## 2017-10-21 NOTE — Progress Notes (Signed)
Social Work Patient ID: Kayla Kent, female   DOB: 1931/12/09, 84 y.o.   MRN: [id number]  According to Pam-PA pt is not medically ready for transfer to NH today. Plan for transfer Monday. Have contacted [facility] and she will have a bed for pt on Monday. Work on this, team, pt and daughter aware of the plans.

## 2017-10-21 NOTE — Progress Notes (Signed)
[place] PHYSICAL MEDICINE & REHABILITATION     PROGRESS NOTE  Subjective/Complaints:  No abd pain, no further nausea, vomited x 1  Large amt undigested TF yesterday Large BM along with episode ROS: Denies CP, SOB, N/V/D.  Objective: Vital Signs: Blood pressure (!) 108/40, pulse (!) 120, temperature 98.2 F (36.8 C), temperature source Oral, resp. rate 16, height 5\' 7"  (1.702 m), weight 43 kg (94 lb 12.8 oz), SpO2 97 %. Dg Chest Port 1 View  Result Date: 10/21/2017 CLINICAL DATA:  Possible aspiration with vomiting for 2 hours. EXAM: PORTABLE CHEST 1 VIEW COMPARISON:  10/09/2017 FINDINGS: Cardiac enlargement. Mild vascular congestion. Diffuse interstitial pattern likely representing edema. Interstitial pneumonia could also have this appearance. Interstitial changes are progressing since previous study. There is persistent area of ovoid opacity in the right upper lung without change. This could represent focal pneumonia or mass lesion. Emphysematous changes in the lungs. Calcification of the aorta. IMPRESSION: Emphysematous changes in the lungs. Cardiac enlargement with developing interstitial edema. Persistent pneumonia versus mass lesion in the right upper lung without change. Electronically Signed   By: Lucienne Capers M.D.   On: 10/21/2017 03:11   Dg Abd Portable 1v  Result Date: 10/21/2017 CLINICAL DATA:  Vomiting EXAM: PORTABLE ABDOMEN - 1 VIEW COMPARISON:  None. FINDINGS: Gastrostomy tube projected over the mid abdomen. Gas and stool throughout the colon. Residual contrast material in the rectosigmoid colon. No small or large bowel distention. Changes likely to represent ileus. Surgical clips in the right upper quadrant. No radiopaque stones. Postoperative changes in both hips. IMPRESSION: Nonobstructing bowel gas pattern. Scattered gas may represent ileus. Electronically Signed   By: Lucienne Capers M.D.   On: 10/21/2017 03:12   Recent Labs    10/19/17 1020 10/20/17 1413  WBC 9.2 7.0   HGB 11.5* 11.2*  HCT 37.7 36.0  PLT 323 305   Recent Labs    10/19/17 1020 10/20/17 1413  NA 131* 130*  K 4.4 4.9  CL 96* 95*  GLUCOSE 124* 106*  BUN 19 21*  CREATININE 0.80 0.70  CALCIUM 8.4* 8.4*   CBG (last 3)  No results for input(s): GLUCAP in the last 72 hours.  Wt Readings from Last 3 Encounters:  10/21/17 43 kg (94 lb 12.8 oz)  09/28/17 46.4 kg (102 lb 4.7 oz)  09/19/17 49.8 kg (109 lb 12.6 oz)    Physical Exam:  BP (!) 108/40 (BP Location: Left Arm)   Pulse (!) 120   Temp 98.2 F (36.8 C) (Oral)   Resp 16   Ht 5\' 7"  (1.702 m)   Wt 43 kg (94 lb 12.8 oz)   SpO2 97%   BMI 14.85 kg/m  Constitutional: She appears well-developed. She appears cachectic. NAD.  HENT: Normocephalic and atraumatic.  Eyes: No discharge. Dysconjugate gaze with right eye in lateral field (stable).  Cardiovascular: Irregularly irregular without JVD. Respiratory: Upper airway sounds. Unlabored. GI: Bowel sounds are normal. She exhibits no distension. +PEG. Musculoskeletal: She exhibits no edema or tenderness in extremities.  Neurological: She is alert. Right facial droop Severe dysarthria,  Able  to follow simple one step motor commands.  Motor: RUE: 4/5 shoulder abduction, 4-/5 elbow flex/ext, 1/5 wrist extension, 1+/5 hand grip (unchanged) No increase in tone noted. RLE: 4+/5 proximal to distal (stable) Skin: Skin is warm and dry. She is not diaphoretic.  Psychiatric: She is flat.  Assessment/Plan: 1. Functional deficits secondary to left MCA infarct which require 3+ hours per day of interdisciplinary therapy  in a comprehensive inpatient rehab setting. Physiatrist is providing close team supervision and 24 hour management of active medical problems listed below. Physiatrist and rehab team continue to assess barriers to discharge/monitor patient progress toward functional and medical goals.  Function:  Bathing Bathing position   Position: Shower  Bathing parts Body parts  bathed by patient: Chest, Abdomen, Right upper leg, Left upper leg, Right lower leg, Front perineal area, Left lower leg, Buttocks, Right arm Body parts bathed by helper: Back, Left arm  Bathing assist Assist Level: Touching or steadying assistance(Pt > 75%)      Upper Body Dressing/Undressing Upper body dressing   What is the patient wearing?: Pull over shirt/dress     Pull over shirt/dress - Perfomed by patient: Thread/unthread left sleeve, Put head through opening, Pull shirt over trunk, Thread/unthread right sleeve Pull over shirt/dress - Perfomed by helper: Thread/unthread right sleeve        Upper body assist Assist Level: Touching or steadying assistance(Pt > 75%)      Lower Body Dressing/Undressing Lower body dressing   What is the patient wearing?: Pants, Shoes, Socks     Pants- Performed by patient: Thread/unthread right pants leg, Pull pants up/down Pants- Performed by helper: Thread/unthread left pants leg   Non-skid slipper socks- Performed by helper: Don/doff right sock, Don/doff left sock Socks - Performed by patient: Don/doff left sock Socks - Performed by helper: Don/doff right sock, Don/doff left sock Shoes - Performed by patient: Don/doff left shoe, Don/doff right shoe Shoes - Performed by helper: Don/doff right shoe          Lower body assist Assist for lower body dressing: Touching or steadying assistance (Pt > 75%)      Toileting Toileting Toileting activity did not occur: No continent bowel/bladder event Toileting steps completed by patient: Performs perineal hygiene, Adjust clothing prior to toileting, Adjust clothing after toileting Toileting steps completed by helper: Adjust clothing prior to toileting, Adjust clothing after toileting Toileting Assistive Devices: Grab bar or rail  Toileting assist Assist level: Touching or steadying assistance (Pt.75%)   Transfers Chair/bed transfer   Chair/bed transfer method: Ambulatory Chair/bed transfer  assist level: Moderate assist (Pt 50 - 74%/lift or lower) Chair/bed transfer assistive device: Armrests, Medical sales representative Ambulation activity did not occur: Safety/medical concerns   Max distance: 150' Assist level: Touching or steadying assistance (Pt > 75%)   Wheelchair   Type: Manual Max wheelchair distance: 100' Assist Level: Supervision or verbal cues  Cognition Comprehension Comprehension assist level: Understands basic 75 - 89% of the time/ requires cueing 10 - 24% of the time  Expression Expression assist level: Expresses basic 50 - 74% of the time/requires cueing 25 - 49% of the time. Needs to repeat parts of sentences.  Social Interaction Social Interaction assist level: Interacts appropriately 75 - 89% of the time - Needs redirection for appropriate language or to initiate interaction.  Problem Solving Problem solving assist level: Solves basic 25 - 49% of the time - needs direction more than half the time to initiate, plan or complete simple activities  Memory Memory assist level: Recognizes or recalls 25 - 49% of the time/requires cueing 50 - 75% of the time    Medical Problem List and Plan: 1.  Hemiparesis, aphasia, dysphagia secondary to Left MCA infarct.   Continue CIR   WHO    Stable for SNF in am 2.  DVT Prophylaxis/Anticoagulation: Pharmaceutical: Other (comment)--Eliquis 3. Pain Management: Tylenol prn  4.  Mood: LCSW to follow for evaluation and support.    Lexapro started on 12/27 5. Neuropsych: This patient is not fully capable of making decisions on her own behalf. 6. Skin/Wound Care: Pressure relief measures.Maintain adequate hydration/nutritional status.  7. Fluids/Electrolytes/Nutrition: Adjust tube feeds to maintain adequate nutritional status. Consulted dietitian for input and adjustment.    BMP within acceptable range on 12/24   TFs adjusted again on 12/31 8. RUL/RLL aspiration PNA   Scheduled Robitussin-DM to help with cough and  secretions    Repeat chest x-ray reviewed, showing persistent right upper lobe disease, but resolution of right lower lobe infiltrate. Per Pulm, pro calcitonin ordered, reviewed, WNL, antibiotics DC'd. No need for further imaging at this time. Follow-up CT in 3 months   Repeat chest x-ray reviewed, showing stable abnormality.   Continue to monitor 9. ?CKD stage III: Monitor renal status with routine checks. Water flushes via PEG.    Creatinine 0.81 on 12/31   Labs pending   Appears to be controlled with adequate hydration 10 Recent GIB: Back on Eliquis.    Hemoglobin 11.8 on 12/31   Labs pending   Continue to monitor 11. Seizure disorder: stable/controlled on Keppra. 12. A Fib: Monitor HR bid. Continue metoprolol and decrease ASA to 81 mg/day.    Heart rate controlled on 1/3, appreciate cardiology consult 13. Thrombocytosis: Resolved   Likely reactive.    Will continue to monitor.  14. Dysphagia: NPO (holding bite trials with ST), tolerating tube feeds 15. Protein calorie malnutrition: Chronic with baseline albumin around 2.5 in the past 6 months.    Pre-albumin 16.9 on 12/13.    Continue to monitor 16. COPD (per X ray)/RUL bronchogenic lung CA: completed XRT 06/2017 17. Hypokalemia: Resolved   Continue to monitor 18. Leukocytosis: Resolved  Cont to monitor 19. Hyperglycemia   Secondary to tube feeds, resolved with bolus feeds, CBGs d/ced on 12/31 20. Urinary frequency  Appears to be retaining now, Ditropan DC'd   Will consider further meds tomorrow   Bethanechol 10 started on 12/28   ?Improving 21. Elevated blood pressure  Overall controlled on 1/2 22. Hyponatremia  Na 130 on 12/31  Labs pending   Cont to monitor 23. Leukocytosis  WBCs 11.5 on 12/31  Labs pending   UA+ WBCs, Cx shows mixed flora, d/c nitrofurantoin, not tolerating, monitor off abx, afeb with nl WBCs 24.  Vomiting, mulifactorial , abd exam normal, Xray shows FOS and Large bowel gas but no dilated small  bowel loops, doubt ileus, likely intolerant to nitrofurantoin (multiple drug all and intolerances) SMOG enema today LOS (Days) 23 A FACE TO FACE EVALUATION WAS PERFORMED  Charlett Blake 10/21/2017 7:18 AM

## 2017-10-21 NOTE — Progress Notes (Signed)
Physical Therapy Session Note  Patient Details  Name: Kayla Kent MRN: [id number] Date of Birth: March 27, 1932  Today's Date: 10/21/2017 PT Individual Time: 0900-0925 PT Individual Time Calculation (min): 25 min   Short Term Goals: Week 3:  PT Short Term Goal 1 (Week 3): STG=LTG due to ELOS  Skilled Therapeutic Interventions/Progress Updates:   Pt in w/c upon arrival and agreeable to therapy, no c/o pain. Worked on functional mobility this session in preparation for discharge. Pt self-propelled w/c 150' w/ supervision using BLEs, verbal cues for obstacle avoidance, negotiated 4 stairs w/ min assist, ambulated w/ close supervision using RW and RUE orthosis, and performed car transfer. Min cues for safety with RW management. Returned to room and ended session in supine, call bell within reach and all needs met.   Therapy Documentation Precautions:  Precautions Precautions: Fall Precaution Comments: R hemiplegia, expressive aphasia Restrictions Weight Bearing Restrictions: No Vital Signs: Therapy Vitals Pulse Rate: (!) 105 BP: (!) 92/54 Pain: Pain Assessment Pain Assessment: No/denies pain  See Function Navigator for Current Functional Status.   Therapy/Group: Individual Therapy  Cassey Bacigalupo K Arnette 10/21/2017, 12:39 PM

## 2017-10-21 NOTE — Plan of Care (Signed)
Incontinent of B/B Denies pain

## 2017-10-22 ENCOUNTER — Inpatient Hospital Stay (HOSPITAL_COMMUNITY): Payer: Self-pay | Admitting: Physical Therapy

## 2017-10-22 ENCOUNTER — Inpatient Hospital Stay (HOSPITAL_COMMUNITY): Payer: Self-pay

## 2017-10-22 NOTE — Progress Notes (Signed)
Occupational Therapy Session Note  Patient Details  Name: Kayla Kent MRN: [id number] Date of Birth: Jul 01, 1932  Today's Date: 10/22/2017 OT Individual Time: 1100-1158 OT Individual Time Calculation (min): 58 min    Short Term Goals: Week 4:  OT Short Term Goal 1 (Week 4): Continue working on established LTGs set at supervision to min A level.  Skilled Therapeutic Interventions/Progress Updates:    Pt asleep in bed upon arrival but easily aroused.  Pt agreeable to bathing/dressing at sink and indicated she needed to use toilet.  Pt incontinent of bowel/bladder.  Pt engaged in ongoing BADL retraining including bathing/dressing with sit<>stand from w/c at sink.  Pt required max verbal cues for sequencing and technique for UE placement with sit<>stand.  Pt required assistance for thoroughness with bathing LUE and buttocks.  Pt initiated hemi dressing strategies but required assistance to follow through.  Pt stood with steady A at sink during LB bathing tasks and to pull up pants.  Pt indicated she wanted to remain in w/c at end of session.  QRB in place and all needs within reach.   Therapy Documentation Precautions:  Precautions Precautions: Fall Precaution Comments: R hemiplegia, expressive aphasia Restrictions Weight Bearing Restrictions: No Pain:  Pt with no s/s of pain and denies pain  See Function Navigator for Current Functional Status.   Therapy/Group: Individual Therapy  Leroy Libman 10/22/2017, 11:55 AM

## 2017-10-22 NOTE — Progress Notes (Signed)
Occupational Therapy Note  Patient Details  Name: Kayla Kent MRN: [id number] Date of Birth: February 10, 1932  Today's Date: 10/22/2017 OT Individual Time: 1345-1430 OT Individual Time Calculation (min): 45 min   Pt with no s/s of pain Individual therapy  Pt resting in w/c upon arrival. Pt requested to return to bed.  When pt stood for transfer, pt indicated she needed to use bathroom.  Pt amb with RW (min A) with assistance to guide RW to bathroom.  Pt required assistance with pulling down pants and pulling up pants.  Pt incontinent of bowel and bladder.  Pt performed perineal hygiene but required assistance for thoroughness.  Pt amb back into room and sat EOB.  Pt performed sit<>stand X 5 with focus on BUE placement for task.  Pt required min A for sit>supine in bed.  Pt remained in bed with all needs within reach and bed alarm activated.    Leotis Shames [facility] 10/22/2017, 2:47 PM

## 2017-10-22 NOTE — Progress Notes (Signed)
[facility]IC[facility] RE[facility]ABILITATION     PROGRESS NOTE  Subjective/Complaints:  Resting quietly, aphasic ROS: Denies CP, SOB, N/V/D.  Objective: Vital Signs: Blood pressure 127/69, pulse (!) 118, temperature 97.9 F (36.6 C), temperature source Oral, resp. rate 18, height 5\' 7"  (1.702 m), weight 45.3 kg (99 lb 13.9 oz), SpO2 93 %. Dg Chest Port 1 View  Result Date: 10/21/2017 CLINICAL DATA:  Possible aspiration with vomiting for 2 hours. EXAM: PORTABLE CHEST 1 VIEW COMPARISON:  10/09/2017 FINDINGS: Cardiac enlargement. Mild vascular congestion. Diffuse interstitial pattern likely representing edema. Interstitial pneumonia could also have this appearance. Interstitial changes are progressing since previous study. There is persistent area of ovoid opacity in the right upper lung without change. This could represent focal pneumonia or mass lesion. Emphysematous changes in the lungs. Calcification of the aorta. IMPRESSION: Emphysematous changes in the lungs. Cardiac enlargement with developing interstitial edema. Persistent pneumonia versus mass lesion in the right upper lung without change. Electronically Signed   By: Lucienne Capers M.D.   On: 10/21/2017 03:11   Dg Abd Portable 1v  Result Date: 10/21/2017 CLINICAL DATA:  Vomiting EXAM: PORTABLE ABDOMEN - 1 VIEW COMPARISON:  None. FINDINGS: Gastrostomy tube projected over the mid abdomen. Gas and stool throughout the colon. Residual contrast material in the rectosigmoid colon. No small or large bowel distention. Changes likely to represent ileus. Surgical clips in the right upper quadrant. No radiopaque stones. Postoperative changes in both hips. IMPRESSION: Nonobstructing bowel gas pattern. Scattered gas may represent ileus. Electronically Signed   By: Lucienne Capers M.D.   On: 10/21/2017 03:12   Recent Labs    10/19/17 1020 10/20/17 1413  WBC 9.2 7.0  HGB 11.5* 11.2*  HCT 37.7 36.0  PLT 323 305   Recent Labs    10/19/17 1020  10/20/17 1413  NA 131* 130*  K 4.4 4.9  CL 96* 95*  GLUCOSE 124* 106*  BUN 19 21*  CREATININE 0.80 0.70  CALCIUM 8.4* 8.4*   CBG (last 3)  No results for input(s): GLUCAP in the last 72 hours.  Wt Readings from Last 3 Encounters:  10/22/17 45.3 kg (99 lb 13.9 oz)  09/28/17 46.4 kg (102 lb 4.7 oz)  09/19/17 49.8 kg (109 lb 12.6 oz)    Physical Exam:  BP 127/69 (BP Location: Left Arm)   Pulse (!) 118   Temp 97.9 F (36.6 C) (Oral)   Resp 18   Ht 5\' 7"  (1.702 m)   Wt 45.3 kg (99 lb 13.9 oz)   SpO2 93%   BMI 15.64 kg/m  Constitutional: She appears well-developed. She appears cachectic. NAD.  HENT: Normocephalic and atraumatic.  Eyes: No discharge. Dysconjugate gaze with right eye in lateral field (stable).  Cardiovascular: Irregularly irregular without JVD. Respiratory: Upper airway sounds. Unlabored. GI: Bowel sounds are normal. She exhibits no distension. +PEG. Musculoskeletal: She exhibits no edema or tenderness in extremities.  Neurological: She is alert. Right facial droop Severe dysarthria,  Able  to follow simple one step motor commands.  Motor: RUE: 4/5 shoulder abduction, 4-/5 elbow flex/ext, 1/5 wrist extension, 1+/5 hand grip (unchanged) No increase in tone noted. RLE: 4+/5 proximal to distal (stable) Skin: Skin is warm and dry. She is not diaphoretic.  Psychiatric: She is flat.  Assessment/Plan: 1. Functional deficits secondary to left MCA infarct which require 3+ hours per day of interdisciplinary therapy in a comprehensive inpatient rehab setting. Physiatrist is providing close team supervision and 24 hour management of active medical  problems listed below. Physiatrist and rehab team continue to assess barriers to discharge/monitor patient progress toward functional and medical goals.  Function:  Bathing Bathing position   Position: Wheelchair/chair at sink  Bathing parts Body parts bathed by patient: Left arm, Chest, Abdomen, Front perineal area,  Buttocks, Right upper leg, Left upper leg, Right lower leg, Left lower leg Body parts bathed by helper: Back, Right arm  Bathing assist Assist Level: Touching or steadying assistance(Pt > 75%)      Upper Body Dressing/Undressing Upper body dressing   What is the patient wearing?: Pull over shirt/dress     Pull over shirt/dress - Perfomed by patient: Thread/unthread left sleeve, Put head through opening, Pull shirt over trunk Pull over shirt/dress - Perfomed by helper: Thread/unthread right sleeve        Upper body assist Assist Level: Touching or steadying assistance(Pt > 75%)      Lower Body Dressing/Undressing Lower body dressing   What is the patient wearing?: Pants, Shoes, Socks     Pants- Performed by patient: Thread/unthread right pants leg, Pull pants up/down Pants- Performed by helper: Thread/unthread left pants leg   Non-skid slipper socks- Performed by helper: Don/doff right sock, Don/doff left sock Socks - Performed by patient: Don/doff left sock Socks - Performed by helper: Don/doff right sock, Don/doff left sock Shoes - Performed by patient: Don/doff left shoe, Don/doff right shoe Shoes - Performed by helper: Don/doff right shoe, Don/doff left shoe          Lower body assist Assist for lower body dressing: Touching or steadying assistance (Pt > 75%)      Toileting Toileting Toileting activity did not occur: No continent bowel/bladder event Toileting steps completed by patient: Performs perineal hygiene, Adjust clothing prior to toileting, Adjust clothing after toileting Toileting steps completed by helper: Adjust clothing prior to toileting, Adjust clothing after toileting Toileting Assistive Devices: Grab bar or rail  Toileting assist Assist level: Touching or steadying assistance (Pt.75%)   Transfers Chair/bed transfer   Chair/bed transfer method: Ambulatory Chair/bed transfer assist level: Touching or steadying assistance (Pt > 75%) Chair/bed transfer  assistive device: Armrests, Medical sales representative Ambulation activity did not occur: Safety/medical concerns   Max distance: 150' Assist level: Touching or steadying assistance (Pt > 75%)   Wheelchair   Type: Manual Max wheelchair distance: 150' Assist Level: Supervision or verbal cues  Cognition Comprehension Comprehension assist level: Understands basic 75 - 89% of the time/ requires cueing 10 - 24% of the time  Expression Expression assist level: Expresses basic 75 - 89% of the time/requires cueing 10 - 24% of the time. Needs helper to occlude trach/needs to repeat words.  Social Interaction Social Interaction assist level: Interacts appropriately 75 - 89% of the time - Needs redirection for appropriate language or to initiate interaction.  Problem Solving Problem solving assist level: Solves basic 50 - 74% of the time/requires cueing 25 - 49% of the time  Memory Memory assist level: Recognizes or recalls 25 - 49% of the time/requires cueing 50 - 75% of the time    Medical Problem List and Plan: 1.  Hemiparesis, aphasia, dysphagia secondary to Left MCA infarct.   Continue CIR   WHO    Stable for SNF Monday 2.  DVT Prophylaxis/Anticoagulation: Pharmaceutical: Other (comment)--Eliquis 3. Pain Management: Tylenol prn  4. Mood: LCSW to follow for evaluation and support.    Lexapro started on 12/27 5. Neuropsych: This patient is not fully capable of making  decisions on her own behalf. 6. Skin/Wound Care: Pressure relief measures.Maintain adequate hydration/nutritional status.  7. Fluids/Electrolytes/Nutrition: Adjust tube feeds to maintain adequate nutritional status. Consulted dietitian for input and adjustment.    BMP within acceptable range on 12/24   TFs adjusted again on 12/31 8. RUL/RLL aspiration PNA   Scheduled Robitussin-DM to help with cough and secretions    Repeat chest x-ray reviewed, showing persistent right upper lobe disease, but resolution of right lower  lobe infiltrate. Per Pulm, pro calcitonin ordered, reviewed, WNL, antibiotics DC'd. No need for further imaging at this time. Follow-up CT in 3 months   Repeat chest x-ray reviewed, showing stable abnormality.   Continue to monitor 9. ?CKD stage III: Monitor renal status with routine checks. Water flushes via PEG.    Creatinine 0.81 on 12/31   Labs pending   Appears to be controlled with adequate hydration 10 Recent GIB: Back on Eliquis.    Hemoglobin 11.8 on 12/31   Labs pending   Continue to monitor 11. Seizure disorder: stable/controlled on Keppra. 12. A Fib: Monitor HR bid. Continue metoprolol and decrease ASA to 81 mg/day.    Heart rate controlled on 1/5, appreciate cardiology consult 13. Thrombocytosis: Resolved   Likely reactive.    Will continue to monitor.  14. Dysphagia: NPO (holding bite trials with ST), tolerating tube feeds 15. Protein calorie malnutrition: Chronic with baseline albumin around 2.5 in the past 6 months.    Pre-albumin 16.9 on 12/13.    Continue to monitor 16. COPD (per X ray)/RUL bronchogenic lung BF:XOVA still demonstrates lesion completed XRT 06/2017 17. Hypokalemia: Resolved   Continue to monitor 18. Leukocytosis: Resolved  Cont to monitor 19. Hyperglycemia   Secondary to tube feeds, resolved with bolus feeds, CBGs d/ced on 12/31 20. Urinary frequency- UA positive UCx contaminated with multi species will check cath specimen   21. Elevated blood pressure  Overall controlled on 1/2 22. Hyponatremia  Na 130 on 12/31  Labs pending   Cont to monitor 23. Leukocytosis  WBCs 11.5 on 12/31  Labs pending   UA+ WBCs, Cx shows mixed flora, d/c nitrofurantoin, not tolerating, monitor off abx, afeb with nl WBCs 24.  Vomiting,resolved likely macrodantinLOS (Days) 24 A FACE TO FACE EVALUATION WAS PERFORMED  Charlett Blake 10/22/2017 6:42 AM

## 2017-10-22 NOTE — Progress Notes (Signed)
Physical Therapy Session Note  Patient Details  Name: Kayla Kent MRN: [id number] Date of Birth: 1932/05/01  Today's Date: 10/22/2017 PT Individual Time: 4492-0100 AND 1300-1326 PT Individual Time Calculation (min): 55 min AND 26 min  Short Term Goals: Week 3:  PT Short Term Goal 1 (Week 3): STG=LTG due to ELOS  Skilled Therapeutic Interventions/Progress Updates:   Session 1:  Pt supine upon arrival and agreeable to therapy, max stimulation to wake up, however able to stay awake once woken up. Worked on household ambulation and transfers this session. Pt reporting she needing to use toilet. Transferred to EOB w/ supervision and ambulated to/from toilet w/ R HHA. Pt had episode of incontinence prior to getting to toilet, unsure if functional incontinence. Provided total assist for pericare and LE garment management. Returned to EOB and total assist to don shoes. Ambulated to stand at sink w/ min guard while brushing hair and washing face, set-up assist only. Ambulated to/from ADL apartment w/ min guard to close supervision using RW and RUE orthosis. Performed massed practice of furniture transfers, min assist overall to boost into standing from low couch surface, other wise transfer performed w/ close supervision. Discussed pt asking caregiver/family for assistance when rising from low furniture surfaces via 1 HHA as that's the only assistance this PT needed to provide to boost, pt verbalized understanding. Returned to room and ended session in supine, call bell within reach and all needs met.   Session 2:  Pt in w/c upon arrival and agreeable to therapy, no c/o pain. Worked on independence functional mobility and functional tasks this session. Pt reports she needs to use bathroom. Ambulated to/from toilet w/ HHA and provided min assist for pericare, total assist for LE garment management. Min cues for technique w/ turning to sit at toilet. Ambulated 150' x2 using RW w/ RUE orthosis and close  supervision. Pt continues to require cues and prompting for use of RUE splint 2/2 R inattention. Returned to room and ended session sitting in w/c, call bell within reach and all needs met. QRB engaged.   Therapy Documentation Precautions:  Precautions Precautions: Fall Precaution Comments: R hemiplegia, expressive aphasia Restrictions Weight Bearing Restrictions: No Vital Signs: Therapy Vitals Temp: 97.9 F (36.6 C) Temp Source: Oral Pulse Rate: (!) 118 Resp: 18 BP: 127/69 Patient Position (if appropriate): Lying Oxygen Therapy SpO2: 93 % O2 Device: Nasal Cannula  See Function Navigator for Current Functional Status.   Therapy/Group: Individual Therapy  Anberlyn Feimster K Arnette 10/22/2017, 8:56 AM

## 2017-10-23 NOTE — Progress Notes (Signed)
[facility]ICAL MEDICINE & REHABILITATION     PROGRESS NOTE  Subjective/Complaints:   No issues overnite, tolerates TF bolus , 1/2 boluses every 2-3 hr durng the day ROS: Denies CP, SOB, N/V/D.  Objective: Vital Signs: Blood pressure 140/60, pulse 90, temperature 98 F (36.7 C), temperature source Oral, resp. rate 16, height 5\' 7"  (1.702 m), weight 45 kg (99 lb 3.3 oz), SpO2 93 %. No results found. Recent Labs    10/20/17 1413  WBC 7.0  HGB 11.2*  HCT 36.0  PLT 305   Recent Labs    10/20/17 1413  NA 130*  K 4.9  CL 95*  GLUCOSE 106*  BUN 21*  CREATININE 0.70  CALCIUM 8.4*   CBG (last 3)  No results for input(s): GLUCAP in the last 72 hours.  Wt Readings from Last 3 Encounters:  10/23/17 45 kg (99 lb 3.3 oz)  09/28/17 46.4 kg (102 lb 4.7 oz)  09/19/17 49.8 kg (109 lb 12.6 oz)    Physical Exam:  BP 140/60 (BP Location: Left Arm)   Pulse 90   Temp 98 F (36.7 C) (Oral)   Resp 16   Ht 5\' 7"  (1.702 m)   Wt 45 kg (99 lb 3.3 oz)   SpO2 93%   BMI 15.54 kg/m  Constitutional: She appears well-developed. She appears cachectic. NAD.  HENT: Normocephalic and atraumatic.  Eyes: No discharge. Dysconjugate gaze with right eye in lateral field (stable).  Cardiovascular: Irregularly irregular without JVD. Respiratory: Upper airway sounds. Unlabored. GI: Bowel sounds are normal. She exhibits no distension. +PEG. Musculoskeletal: She exhibits no edema or tenderness in extremities.  Neurological: She is alert. Right facial droop Severe dysarthria,  Able  to follow simple one step motor commands.  Motor: RUE: 4/5 shoulder abduction, 4-/5 elbow flex/ext, 1/5 wrist extension, 1+/5 hand grip (unchanged) No increase in tone noted. RLE: 4+/5 proximal to distal (stable) Skin: Skin is warm and dry. She is not diaphoretic.  Psychiatric: She is flat.  Assessment/Plan: 1. Functional deficits secondary to left MCA infarct which require 3+ hours per day of interdisciplinary  therapy in a comprehensive inpatient rehab setting. Physiatrist is providing close team supervision and 24 hour management of active medical problems listed below. Physiatrist and rehab team continue to assess barriers to discharge/monitor patient progress toward functional and medical goals.  Function:  Bathing Bathing position   Position: Wheelchair/chair at sink  Bathing parts Body parts bathed by patient: Right arm, Chest, Abdomen, Front perineal area, Right upper leg, Left upper leg, Right lower leg, Left lower leg Body parts bathed by helper: Left arm, Buttocks, Back  Bathing assist Assist Level: Touching or steadying assistance(Pt > 75%)      Upper Body Dressing/Undressing Upper body dressing   What is the patient wearing?: Pull over shirt/dress     Pull over shirt/dress - Perfomed by patient: Thread/unthread left sleeve, Put head through opening, Pull shirt over trunk Pull over shirt/dress - Perfomed by helper: Thread/unthread right sleeve        Upper body assist Assist Level: Touching or steadying assistance(Pt > 75%)      Lower Body Dressing/Undressing Lower body dressing   What is the patient wearing?: Pants, Socks, Shoes     Pants- Performed by patient: Thread/unthread right pants leg, Thread/unthread left pants leg Pants- Performed by helper: Pull pants up/down   Non-skid slipper socks- Performed by helper: Don/doff right sock, Don/doff left sock Socks - Performed by patient: Don/doff left sock Socks - Performed  by helper: Don/doff right sock, Don/doff left sock Shoes - Performed by patient: Don/doff left shoe, Don/doff right shoe Shoes - Performed by helper: Fasten right, Fasten left          Lower body assist Assist for lower body dressing: Touching or steadying assistance (Pt > 75%)      Toileting Toileting Toileting activity did not occur: No continent bowel/bladder event Toileting steps completed by patient: Performs perineal hygiene, Adjust  clothing prior to toileting Toileting steps completed by helper: Adjust clothing after toileting Toileting Assistive Devices: Grab bar or rail  Toileting assist Assist level: Touching or steadying assistance (Pt.75%)   Transfers Chair/bed transfer   Chair/bed transfer method: Ambulatory Chair/bed transfer assist level: Touching or steadying assistance (Pt > 75%) Chair/bed transfer assistive device: Armrests, Medical sales representative Ambulation activity did not occur: Safety/medical concerns   Max distance: 150' Assist level: Supervision or verbal cues   Wheelchair   Type: Manual Max wheelchair distance: 150' Assist Level: Supervision or verbal cues  Cognition Comprehension Comprehension assist level: Understands basic 75 - 89% of the time/ requires cueing 10 - 24% of the time  Expression Expression assist level: Expresses basic 75 - 89% of the time/requires cueing 10 - 24% of the time. Needs helper to occlude trach/needs to repeat words.  Social Interaction Social Interaction assist level: Interacts appropriately 75 - 89% of the time - Needs redirection for appropriate language or to initiate interaction.  Problem Solving Problem solving assist level: Solves basic 50 - 74% of the time/requires cueing 25 - 49% of the time  Memory Memory assist level: Recognizes or recalls 25 - 49% of the time/requires cueing 50 - 75% of the time    Medical Problem List and Plan: 1.  Hemiparesis, aphasia, dysphagia secondary to Left MCA infarct.   Continue CIR   WHO    Stable for SNF Monday 2.  DVT Prophylaxis/Anticoagulation: Pharmaceutical: Other (comment)--Eliquis 3. Pain Management: Tylenol prn  4. Mood: LCSW to follow for evaluation and support.    Lexapro started on 12/27 5. Neuropsych: This patient is not fully capable of making decisions on her own behalf. 6. Skin/Wound Care: Pressure relief measures.Maintain adequate hydration/nutritional status.  7.  Fluids/Electrolytes/Nutrition: Adjust tube feeds to maintain adequate nutritional status. Consulted dietitian for input and adjustment.    BMP within acceptable range on 12/24   TFs needs smaller more freq boluses 8. RUL/RLL aspiration PNA   Scheduled Robitussin-DM to help with cough and secretions    Repeat chest x-ray reviewed, showing persistent right upper lobe disease, but resolution of right lower lobe infiltrate. Per Pulm, pro calcitonin ordered, reviewed, WNL, antibiotics DC'd. No need for further imaging at this time. Follow-up CT in 3 months   Repeat chest x-ray reviewed, showing stable abnormality.   Continue to monitor 9. ?CKD stage III: Monitor renal status with routine checks. Water flushes via PEG.    Creatinine 0.81 on 12/31   Labs pending   Appears to be controlled with adequate hydration 10 Recent GIB: Back on Eliquis.    Hemoglobin 11.8 on 12/31   Labs pending   Continue to monitor 11. Seizure disorder: stable/controlled on Keppra. 12. A Fib: Monitor HR bid. Continue metoprolol and decrease ASA to 81 mg/day.  Vitals:   10/22/17 2127 10/23/17 0220  BP: (!) 136/54 140/60  Pulse: 81 90  Resp:  16  Temp:  98 F (36.7 C)  SpO2:  93%     Heart rate controlled  on 1/6, appreciate cardiology consult 13. Thrombocytosis: Resolved   Likely reactive.    Will continue to monitor.  14. Dysphagia: NPO (holding bite trials with ST), tolerating tube feeds 15. Protein calorie malnutrition: Chronic with baseline albumin around 2.5 in the past 6 months.    Pre-albumin 16.9 on 12/13.    Continue to monitor 16. COPD (per X ray)/RUL bronchogenic lung SE:GBTD still demonstrates lesion completed XRT 06/2017 17. Hypokalemia: Resolved   Continue to monitor 18. Leukocytosis: Resolved  Cont to monitor 19. Hyperglycemia   Secondary to tube feeds, resolved with bolus feeds, CBGs d/ced on 12/31 20. Urinary frequency- UA positive UCx contaminated with multi species will check cath  specimen   21. Elevated blood pressure  Overall controlled on 1/2 22. Hyponatremia  Na 130 on 12/31  Labs pending   Cont to monitor 23. Leukocytosis Resolved  24.  Vomiting,resolved likely macrodantinLOS (Days) 25 A FACE TO FACE EVALUATION WAS PERFORMED  Charlett Blake 10/23/2017 7:26 AM

## 2017-10-23 NOTE — Progress Notes (Addendum)
Physical Therapy Discharge Summary  Patient Details  Name: Kayla Kent MRN: [id number] Date of Birth: October 16, 1932  Today's Date: 10/24/2017 PT Individual Time:1115-1215; 60 min individual tx  -     Patient has met 4 of 6 long term goals due to improved activity tolerance, improved balance, improved postural control, increased strength, ability to compensate for deficits, improved attention, improved awareness and improved coordination.  Patient to discharge at a wheelchair level Supervision/min assist.   Patient's care partner unavailable to provide the necessary physical assistance at discharge. Anticipate patient will continue to improve functional level at next facility. Family to decide on level of physical assistance they can provide at that time if patient/family choose to discharge to home in the future.   Reasons goals not met: motor planning deficits,  inattention  Recommendation:  Patient will benefit from ongoing skilled PT services in skilled nursing facility setting to continue to advance safe functional mobility, address ongoing impairments in global strength, postural control and midline orientation, endurance and tolerance to OOB activity, and balance, and minimize fall risk.  Equipment: No equipment provided  Reasons for discharge: treatment goals met and discharge from hospital  Patient/family agrees with progress made and goals achieved: Yes  PT Discharge  Pt initially refused tx due to nausea.  PT consulted with Deidre Ala, RN who stated that pt is unhappy and anxious about d/c to SNF.  PT provided pt with emotional support and she did participate.  Stand pivot transfer bed> w/c with RW, supervision.  Pt initially stated that she did not need to use toilet.  As PT pushed w/c out of room, pt communicated that she might need to use toilet.  Pt found to be incontinent of bowel.  She voided continently on toilet. Toilet transfer, with mod assist due to inattention to R hand and  resultant LOB.  Pt needed maximum assistance with clothing mgt and moderate with hygiene.  W/c propulsion using hemi technique with min assist due to inconsistent use of LLE for steering, despite max multimodal cues.  Gait on level terrain with RW with supervision.  Sit> stand generally supervision, but needed min assist x 1 as pt had LOB backwards.  Pt needed hand over hand assist to place R hand in RW orthosis due to motor planning and attention problems.  Attempted retrieving a pen from floor when standing with RW, but PT terminated this due to excessive time pt was taking, as L hand trembled and she was unable to pick up pen.  Pt requested getting back to bed at end of session.  Pt left resting in bed with HOB >30 degrees, all needs within reach and alarm set. Precautions/Restrictions- falls; has PEG   Pain- none per pt   Vision/Perception -R eye lateral deviation- see OT d/c.  Wears glasses at all times.    Cognition Overall Cognitive Status: Impaired/Different from baseline Arousal/Alertness: Awake/alert Orientation Level: Oriented to person;Oriented to place;Oriented to time Attention: Sustained;Selective Focused Attention: Appears intact Sustained Attention: Appears intact Selective Attention: Impaired Memory: Impaired Memory Impairment: Retrieval deficit;Decreased recall of new information;Storage deficit Awareness: Impaired Awareness Impairment: Emergent impairment;Anticipatory impairment Problem Solving Impairment: Verbal basic;Functional basic Comments: Pt still demonstrates decreased attention to the right UE with positioning after falling from the side of her lap.  Needs mod to max cueing for repositioning. Sensation- impaired; NT at d/c   Motor  Motor Motor: Hemiplegia;Abnormal tone;Motor apraxia;Abnormal postural alignment and control;Motor perseverations Motor - Skilled Clinical Observations: Generalized weakness, increased RLE tone, pusher  syndrome, R hemi  Motor -  Discharge Observations: improved, but motor apraxia still evident  Mobility Bed Mobility Bed Mobility: Supine to Sit;Sit to Supine Rolling Right: 5: Supervision Rolling Right Details: Verbal cues for precautions/safety;Verbal cues for technique Rolling Left: 5: Supervision Rolling Left Details: Verbal cues for technique;Verbal cues for precautions/safety Supine to Sit: HOB elevated;With rails;5: Supervision Supine to Sit Details: Verbal cues for precautions/safety;Verbal cues for technique;Manual facilitation for placement;Manual facilitation for weight bearing Sit to Supine: 5: Supervision;HOB elevated;With rail Sit to Supine - Details: Verbal cues for precautions/safety;Manual facilitation for placement;Verbal cues for technique;Manual facilitation for weight bearing Transfers Transfers: Yes Sit to Stand: 5: Supervision;With armrests(with RW) Stand to Sit: 5: Supervision Stand to Sit Details (indicate cue type and reason): Verbal cues for precautions/safety Stand Pivot Transfers: 5: Supervision;4: Min assist Stand Pivot Transfer Details: Verbal cues for precautions/safety;Verbal cues for safe use of DME/AE Locomotion  Ambulation Ambulation: Yes Ambulation/Gait Assistance: 5: Supervision Assistive device: Rolling walker Gait Gait: Yes Gait Pattern: Impaired Gait Pattern: Decreased trunk rotation;Narrow base of support;Step-through pattern;Decreased step length - right;Decreased weight shift to right Stairs / Additional Locomotion Stairs: Yes Stairs Assistance: 4: Min assist Stair Management Technique: One rail Left Height of Stairs: 6 Ramp: Not tested (comment) Product manager Mobility: Yes Wheelchair Assistance: 4: Advertising account executive Details: Verbal cues for precautions/safety;Verbal cues for Marketing executive: Left upper extremity;Right lower extremity Wheelchair Parts Management: Needs assistance  Trunk/Postural Assessment   Cervical Assessment Cervical Assessment: Exceptions to WFL(forward head at rest) Thoracic Assessment Thoracic Assessment: Exceptions to WFL(thoracic rounding in the shoulders) Lumbar Assessment Lumbar Assessment: Exceptions to WFL(posterior pelvic tilt at rest ) Postural Control Postural Control: (Still with increased bias/pushing to the right during transitional movements and standing)  Balance Standardized Balance Assessment  Standardized Balance Assessment: Berg Balance Test Berg Balance Test on 10/14/17 Sit to Stand: Able to stand without using hands and stabilize independently Standing Unsupported: Able to stand 2 minutes with supervision Sitting with Back Unsupported but Feet Supported on Floor or Stool: Able to sit safely and securely 2 minutes Stand to Sit: Sits safely with minimal use of hands Transfers: Able to transfer safely, definite need of hands Standing Unsupported with Eyes Closed: Able to stand 10 seconds with supervision Standing Ubsupported with Feet Together: Needs help to attain position and unable to hold for 15 seconds From Standing, Reach Forward with Outstretched Arm: Can reach forward >12 cm safely (5") From Standing Position, Pick up Object from Floor: Unable to try/needs assist to keep balance From Standing Position, Turn to Look Behind Over each Shoulder: Turn sideways only but maintains balance Turn 360 Degrees: Needs assistance while turning Standing Unsupported, Alternately Place Feet on Step/Stool: Needs assistance to keep from falling or unable to try Standing Unsupported, One Foot in Front: Loses balance while stepping or standing Standing on One Leg: Unable to try or needs assist to prevent fall Total Score: 26  Static Sitting Balance Static Sitting - Balance Support: Feet supported Static Sitting - Level of Assistance: 6: Modified independent (Device/Increase time) Dynamic Sitting Balance Dynamic Sitting - Balance Support: During functional  activity Dynamic Sitting - Level of Assistance: 5: Stand by assistance Static Standing Balance Static Standing - Balance Support: Bilateral upper extremity supported Static Standing - Level of Assistance: 5: Stand by assistance Dynamic Standing Balance Dynamic Standing - Balance Support: During functional activity;Bilateral upper extremity supported Dynamic Standing - Level of Assistance: 4: Min assist Extremity Assessment      RLE  Assessment RLE Assessment: Exceptions to WFL(increased tone, 3+ to 4/5 globally) LLE Assessment LLE Assessment: Within Functional Limits   See Function Navigator for Current Functional Status.  Nubia Ziesmer 10/24/2017, 4:59 PM

## 2017-10-24 ENCOUNTER — Inpatient Hospital Stay (HOSPITAL_COMMUNITY): Payer: Self-pay

## 2017-10-24 ENCOUNTER — Inpatient Hospital Stay (HOSPITAL_COMMUNITY): Payer: Self-pay | Admitting: Occupational Therapy

## 2017-10-24 DIAGNOSIS — F331 Major depressive disorder, recurrent, moderate: Secondary | ICD-10-CM | POA: Diagnosis not present

## 2017-10-24 DIAGNOSIS — R52 Pain, unspecified: Secondary | ICD-10-CM | POA: Diagnosis not present

## 2017-10-24 DIAGNOSIS — K219 Gastro-esophageal reflux disease without esophagitis: Secondary | ICD-10-CM | POA: Diagnosis not present

## 2017-10-24 DIAGNOSIS — I69351 Hemiplegia and hemiparesis following cerebral infarction affecting right dominant side: Principal | ICD-10-CM

## 2017-10-24 DIAGNOSIS — K5900 Constipation, unspecified: Secondary | ICD-10-CM | POA: Diagnosis not present

## 2017-10-24 DIAGNOSIS — R634 Abnormal weight loss: Secondary | ICD-10-CM | POA: Diagnosis not present

## 2017-10-24 DIAGNOSIS — G464 Cerebellar stroke syndrome: Secondary | ICD-10-CM | POA: Diagnosis not present

## 2017-10-24 DIAGNOSIS — R279 Unspecified lack of coordination: Secondary | ICD-10-CM | POA: Diagnosis not present

## 2017-10-24 DIAGNOSIS — R1312 Dysphagia, oropharyngeal phase: Secondary | ICD-10-CM | POA: Diagnosis not present

## 2017-10-24 DIAGNOSIS — I69391 Dysphagia following cerebral infarction: Secondary | ICD-10-CM | POA: Diagnosis not present

## 2017-10-24 DIAGNOSIS — I63512 Cerebral infarction due to unspecified occlusion or stenosis of left middle cerebral artery: Secondary | ICD-10-CM | POA: Diagnosis not present

## 2017-10-24 DIAGNOSIS — Z5189 Encounter for other specified aftercare: Secondary | ICD-10-CM | POA: Diagnosis not present

## 2017-10-24 DIAGNOSIS — I639 Cerebral infarction, unspecified: Secondary | ICD-10-CM | POA: Diagnosis not present

## 2017-10-24 DIAGNOSIS — I10 Essential (primary) hypertension: Secondary | ICD-10-CM | POA: Diagnosis not present

## 2017-10-24 DIAGNOSIS — R1310 Dysphagia, unspecified: Secondary | ICD-10-CM | POA: Diagnosis not present

## 2017-10-24 DIAGNOSIS — G4700 Insomnia, unspecified: Secondary | ICD-10-CM | POA: Diagnosis not present

## 2017-10-24 DIAGNOSIS — J449 Chronic obstructive pulmonary disease, unspecified: Secondary | ICD-10-CM | POA: Diagnosis not present

## 2017-10-24 DIAGNOSIS — R41841 Cognitive communication deficit: Secondary | ICD-10-CM | POA: Diagnosis not present

## 2017-10-24 DIAGNOSIS — I69354 Hemiplegia and hemiparesis following cerebral infarction affecting left non-dominant side: Secondary | ICD-10-CM | POA: Diagnosis not present

## 2017-10-24 DIAGNOSIS — I63312 Cerebral infarction due to thrombosis of left middle cerebral artery: Secondary | ICD-10-CM | POA: Diagnosis not present

## 2017-10-24 DIAGNOSIS — R4182 Altered mental status, unspecified: Secondary | ICD-10-CM | POA: Diagnosis not present

## 2017-10-24 DIAGNOSIS — N390 Urinary tract infection, site not specified: Secondary | ICD-10-CM | POA: Diagnosis not present

## 2017-10-24 DIAGNOSIS — R1319 Other dysphagia: Secondary | ICD-10-CM | POA: Diagnosis not present

## 2017-10-24 DIAGNOSIS — W19XXXA Unspecified fall, initial encounter: Secondary | ICD-10-CM | POA: Diagnosis not present

## 2017-10-24 DIAGNOSIS — J189 Pneumonia, unspecified organism: Secondary | ICD-10-CM | POA: Diagnosis not present

## 2017-10-24 DIAGNOSIS — E785 Hyperlipidemia, unspecified: Secondary | ICD-10-CM | POA: Diagnosis not present

## 2017-10-24 DIAGNOSIS — Z043 Encounter for examination and observation following other accident: Secondary | ICD-10-CM | POA: Diagnosis not present

## 2017-10-24 DIAGNOSIS — R338 Other retention of urine: Secondary | ICD-10-CM | POA: Diagnosis not present

## 2017-10-24 DIAGNOSIS — J690 Pneumonitis due to inhalation of food and vomit: Secondary | ICD-10-CM | POA: Diagnosis not present

## 2017-10-24 DIAGNOSIS — I69322 Dysarthria following cerebral infarction: Secondary | ICD-10-CM | POA: Diagnosis not present

## 2017-10-24 DIAGNOSIS — F419 Anxiety disorder, unspecified: Secondary | ICD-10-CM | POA: Diagnosis not present

## 2017-10-24 DIAGNOSIS — I4891 Unspecified atrial fibrillation: Secondary | ICD-10-CM | POA: Diagnosis not present

## 2017-10-24 DIAGNOSIS — M6281 Muscle weakness (generalized): Secondary | ICD-10-CM | POA: Diagnosis not present

## 2017-10-24 MED ORDER — APIXABAN 2.5 MG PO TABS: 2.5000 mg | ORAL_TABLET | Freq: Two times a day (BID) | ORAL | Status: DC

## 2017-10-24 MED ORDER — ATORVASTATIN CALCIUM 20 MG PO TABS: 20.0000 mg | ORAL_TABLET | Freq: Every day | ORAL | Status: DC

## 2017-10-24 MED ORDER — ONDANSETRON HCL 4 MG PO TABS: 4.0000 mg | ORAL_TABLET | Freq: Four times a day (QID) | ORAL | 0 refills | Status: AC | PRN

## 2017-10-24 MED ORDER — ESCITALOPRAM OXALATE 5 MG PO TABS: 5.0000 mg | ORAL_TABLET | Freq: Every day | ORAL | Status: AC

## 2017-10-24 MED ORDER — ASPIRIN 81 MG PO CHEW: 81.0000 mg | CHEWABLE_TABLET | Freq: Every day | ORAL | Status: DC

## 2017-10-24 MED ORDER — POLYMYXIN B-TRIMETHOPRIM 10000-0.1 UNIT/ML-% OP SOLN: 1.0000 [drp] | Freq: Four times a day (QID) | OPHTHALMIC | 0 refills | Status: DC

## 2017-10-24 MED ORDER — FLEET ENEMA 7-19 GM/118ML RE ENEM: 1.0000 | ENEMA | Freq: Every day | RECTAL | 0 refills | Status: DC | PRN

## 2017-10-24 MED ORDER — OSMOLITE 1.5 CAL PO LIQD
220.0000 mL | Freq: Every day | ORAL | 0 refills | Status: DC
Start: 2017-10-24 — End: 2018-05-30

## 2017-10-24 MED ORDER — METOPROLOL TARTRATE 25 MG/10 ML ORAL SUSPENSION: 25.0000 mg | Freq: Two times a day (BID) | ORAL | Status: DC

## 2017-10-24 MED ORDER — ALUM & MAG HYDROXIDE-SIMETH 200-200-20 MG/5ML PO SUSP: 30.0000 mL | ORAL | 0 refills | Status: DC | PRN

## 2017-10-24 MED ORDER — POLYETHYLENE GLYCOL 3350 17 G PO PACK: 17.0000 g | PACK | Freq: Two times a day (BID) | ORAL | 0 refills | Status: DC

## 2017-10-24 MED ORDER — ORAL CARE MOUTH RINSE: 15.0000 mL | Freq: Two times a day (BID) | OROMUCOSAL | 0 refills | Status: DC

## 2017-10-24 MED ORDER — SIMETHICONE 40 MG/0.6ML PO SUSP: 40.0000 mg | Freq: Four times a day (QID) | ORAL | 0 refills | Status: DC | PRN

## 2017-10-24 MED ORDER — ACETAMINOPHEN 160 MG/5ML PO SOLN: 500.0000 mg | ORAL | 0 refills | Status: DC | PRN

## 2017-10-24 MED ORDER — PANTOPRAZOLE SODIUM 40 MG PO PACK
40.0000 mg | PACK | Freq: Two times a day (BID) | ORAL | Status: DC
Start: 2017-10-24 — End: 2018-02-20

## 2017-10-24 MED ORDER — MELATONIN 3 MG PO TABS: 1.5000 mg | ORAL_TABLET | Freq: Every day | ORAL | 0 refills | Status: AC

## 2017-10-24 MED ORDER — COLLAGENASE 250 UNIT/GM EX OINT: TOPICAL_OINTMENT | Freq: Every day | CUTANEOUS | 0 refills | Status: DC

## 2017-10-24 MED ORDER — GERHARDT'S BUTT CREAM: 1.0000 "application " | TOPICAL_CREAM | Freq: Four times a day (QID) | CUTANEOUS | Status: DC

## 2017-10-24 MED ORDER — BETHANECHOL CHLORIDE 10 MG PO TABS: 10.0000 mg | ORAL_TABLET | Freq: Three times a day (TID) | ORAL | Status: AC

## 2017-10-24 MED ORDER — FREE WATER: 200.0000 mL | Freq: Three times a day (TID) | Status: DC

## 2017-10-24 MED ORDER — GUAIFENESIN-DM 100-10 MG/5ML PO SYRP: 10.0000 mL | ORAL_SOLUTION | Freq: Three times a day (TID) | ORAL | 0 refills | Status: DC

## 2017-10-24 NOTE — Progress Notes (Signed)
Report called to nursing supervisor at [facility].

## 2017-10-24 NOTE — Progress Notes (Signed)
Occupational Therapy Discharge Summary  Patient Details  Name: Kayla Kent MRN: [id number] Date of Birth: [birth year]  Today's Date: 10/24/2017 OT Individual Time: 0800-0900 OT Individual Time Calculation (min): 60 min   Session Note:  Pt completed transfer to the toilet from the EOB with min assist using the RW, and mod demonstrational cueing to sequence moving the RW in the same direction she was attempting to go.  She would leave the RW to the side when making turns, but could correct this with cueing.  She was unsuccessful with toileting task but completed all aspects except for pulling up pants, as she transferred over to the tub bench for shower after toileting.  Mod instructional cueing to sequence bathing with min assist overall.  She needed mod facilitation of the RUE to wash the left arm but needed min assist for manipulation of the hand held shower to rinse off completely and for drying thoroughly.  Min assist for sit to stand and standing to wash and rinse peri area.  Dressing completed at wheelchair level at the sink.  Min assist for threading the RUE as she could not sequence placing it in the bottom of the shirt, and continued to miss the opening.  Min assist also needed to thread the left pants leg but then she could complete the right, with min assist for standing to pull them up over her hips. Max assist for left sock but she completed the right with supervision as well as donning both shoes with use of elastic laces.  Grooming tasks with overall supervision to min assist for thoroughness when applying makeup to both sides of her face as well as lip stick.  Finished session with pt in the wheelchair with call button and phone in reach.     Patient has met 8 of 11 long term goals due to improved balance, postural control, ability to compensate for deficits, functional use of  RIGHT upper and RIGHT lower extremity, improved attention, improved awareness and improved coordination.  Patient  to discharge at [facility] Assist level.  Patient's care partner unavailable to provide the necessary physical and cognitive assistance at discharge.    Reasons goals not met: Pt continues to need min assist for some grooming tasks as well as min assist for UB dressing, and mod assist for LB dressing at this time.    Recommendation:  Patient will benefit from ongoing skilled OT services in skilled nursing facility setting to continue to advance functional skills in the area of BADL and Reduce care partner burden.  Pt has made excellent progress with OT and has demonstrated significant improvements with balance and midline orientation.  She continues to demonstrate significant motor planning deficits as well as RUE deficits, requiring mod assist overall for integrated use in ADLs.  Mod to max instructional cueing is still needed to sequence through showering tasks but only min assist overall to complete the activity.  Mod assist is needed for LB dressing secondary to pt's history of left hip fracture and difficulty reaching the LE for dressing.  Integration of AE has not worked well secondary to the motor planning deficits but feel this could be beneficial in the future as she progresses.  Recommend continued OT at SNF level as family cannot provide 24 hour min assist at this time.    Equipment: No equipment provided  Reasons for discharge: treatment goals met and discharge from hospital  Patient/family agrees with progress made and goals achieved: No   OT Discharge  Precautions/Restrictions  Precautions Precautions: Fall Precaution Comments: R hemiplegia, expressive aphasia Restrictions Weight Bearing Restrictions: No  Pain Pain Assessment Pain Assessment: No/denies pain ADL  See Functional Section of chart for details  Vision Baseline Vision/History: Wears glasses(histry of right eye strabismus ) Wears Glasses: Reading only Patient Visual Report: Blurring of vision Vision  Assessment?: Yes Eye Alignment: Impaired (comment) Ocular Range of Motion: Restricted on the right;Other (comment) Tracking/Visual Pursuits: Right eye does not track medially;Other (comment) Convergence: Impaired (comment) Visual Fields: Other (comment) Perception  Perception: Impaired Inattention/Neglect: Does not attend to right side of body;Impaired-to be further tested in functional context Praxis Praxis: Impaired Praxis Impairment Details: Motor planning;Ideomotor Praxis-Other Comments: Deficits still noted with motor planning when attempting to brush the teeth and for manipulationg items with BUEs such as opening deodorant.   Cognition Overall Cognitive Status: Impaired/Different from baseline Arousal/Alertness: Awake/alert Orientation Level: Oriented to person;Oriented to place;Oriented to time Attention: Sustained;Selective Focused Attention: Appears intact Sustained Attention: Appears intact Selective Attention: Impaired Memory: Impaired Memory Impairment: Retrieval deficit;Decreased recall of new information;Storage deficit Awareness: Impaired Awareness Impairment: Emergent impairment;Anticipatory impairment Problem Solving Impairment: Verbal basic;Functional basic Comments: Pt still demonstrates decreased attention to the right UE with positioning after falling from the side of her lap.  Needs mod to max cueing for repositioning.  Decreased carryover of techniques regarding hemi dressing from session to session or for walker usage.   Sensation Sensation Light Touch: Impaired Detail Light Touch Impaired Details: Impaired RUE Stereognosis: Not tested Hot/Cold: Not tested Proprioception: Not tested Additional Comments: Pt able to detect light touch but not accurate with pointing exactly to the spot touched when eyes were closed.  difficult to accurately assess secondary to motor planning deficits.  Coordination Gross Motor Movements are Fluid and Coordinated: No Fine  Motor Movements are Fluid and Coordinated: No Coordination and Movement Description: Pt needs mod assist for RUE functional use.  She demonstrates AROM WFLS in the shoulder and elbow but she demonstrates only trace digit flexion.   Motor  Motor Motor: Hemiplegia;Motor apraxia;Abnormal postural alignment and control Motor - Discharge Observations: Pt still with mild hemiparesis in the RUE, especially in the right wrist and hand.  Generailized weakness in the right arm  Mobility  Bed Mobility Bed Mobility: Supine to Sit Supine to Sit: 4: Min assist Transfers Transfers: Sit to Stand Sit to Stand: 4: Min assist;With armrests;With upper extremity assist;From chair/3-in-1 Stand to Sit: With armrests;4: Min assist;With upper extremity assist;To chair/3-in-1  Trunk/Postural Assessment  Cervical Assessment Cervical Assessment: Exceptions to WFL(forward head at rest) Thoracic Assessment Thoracic Assessment: Exceptions to WFL(thoracic rounding in the shoulders) Lumbar Assessment Lumbar Assessment: Exceptions to WFL(posterior pelvic tilt at rest ) Postural Control Postural Control: (Still with increased bias/pushing to the right during transitional movements and standing)  Balance Balance Balance Assessed: Yes Static Sitting Balance Static Sitting - Balance Support: Feet supported Static Sitting - Level of Assistance: 6: Modified independent (Device/Increase time) Dynamic Sitting Balance Dynamic Sitting - Balance Support: During functional activity Dynamic Sitting - Level of Assistance: 5: Stand by assistance Static Standing Balance Static Standing - Balance Support: During functional activity;Bilateral upper extremity supported Static Standing - Level of Assistance: 4: Min assist Dynamic Standing Balance Dynamic Standing - Balance Support: During functional activity;Bilateral upper extremity supported Dynamic Standing - Level of Assistance: 4: Min assist Extremity/Trunk Assessment RUE  Assessment RUE Assessment: Exceptions to [facility] RUE AROM (degrees) RUE Overall AROM Comments: Pt with AROM WFLS for the shoulder and elbow,  Brunnstrum stage II  in the digits with only gross trace digit flexion noted at this time.  Pt has off the shelf right wrist cockup splint for wear during the day and resting hand splint for night time wear. Mod assist needed to integrate the RUE into functional tasks during ADL.   LUE Assessment LUE Assessment: Within Functional Limits   See Function Navigator for Current Functional Status.  , OTR/L 10/24/2017, 12:22 PM

## 2017-10-24 NOTE — Progress Notes (Signed)
[place] PHYSICAL MEDICINE & REHABILITATION     PROGRESS NOTE  Subjective/Complaints:   Bowels have been moving, no vomiting or abd pain  ROS: Denies CP, SOB, N/V/D.  Objective: Vital Signs: Blood pressure (!) 146/67, pulse 71, temperature 98 F (36.7 C), temperature source Oral, resp. rate 18, height 5\' 7"  (1.702 m), weight 45.1 kg (99 lb 7.6 oz), SpO2 98 %. No results found. No results for input(s): WBC, HGB, HCT, PLT in the last 72 hours. No results for input(s): NA, K, CL, GLUCOSE, BUN, CREATININE, CALCIUM in the last 72 hours.  Invalid input(s): CO CBG (last 3)  No results for input(s): GLUCAP in the last 72 hours.  Wt Readings from Last 3 Encounters:  10/24/17 45.1 kg (99 lb 7.6 oz)  09/28/17 46.4 kg (102 lb 4.7 oz)  09/19/17 49.8 kg (109 lb 12.6 oz)    Physical Exam:  BP (!) 146/67 (BP Location: Left Arm)   Pulse 71   Temp 98 F (36.7 C) (Oral)   Resp 18   Ht 5\' 7"  (1.702 m)   Wt 45.1 kg (99 lb 7.6 oz)   SpO2 98%   BMI 15.58 kg/m  Constitutional: She appears well-developed. She appears cachectic. NAD.  HENT: Normocephalic and atraumatic.  Eyes: No discharge. Dysconjugate gaze with right eye in lateral field (stable).  Cardiovascular: Irregularly irregular without JVD. Respiratory: Upper airway sounds. Unlabored. GI: Bowel sounds are normal. She exhibits no distension. +PEG. Musculoskeletal: She exhibits no edema or tenderness in extremities.  Neurological: She is alert. Right facial droop Severe dysarthria,  Able  to follow simple one step motor commands.  Oriented to person place time Motor: RUE: 4/5 shoulder abduction, 4-/5 elbow flex/ext, 1/5 wrist extension, 1+/5 hand grip (unchanged) No increase in tone noted. RLE: 4+/5 proximal to distal (stable) Skin: Skin is warm and dry. She is not diaphoretic.  Psychiatric: She is flat.  Assessment/Plan: 1. Functional deficits secondary to left MCA infarct  Stable for D/C today F/u PCP in 3-4 weeks F/u  PM&R 2 weeks See D/C summary See D/C instructions Function:  Bathing Bathing position   Position: Wheelchair/chair at sink  Bathing parts Body parts bathed by patient: Right arm, Chest, Abdomen, Front perineal area, Right upper leg, Left upper leg, Right lower leg, Left lower leg Body parts bathed by helper: Left arm, Buttocks, Back  Bathing assist Assist Level: Touching or steadying assistance(Pt > 75%)      Upper Body Dressing/Undressing Upper body dressing   What is the patient wearing?: Pull over shirt/dress     Pull over shirt/dress - Perfomed by patient: Thread/unthread left sleeve, Put head through opening, Pull shirt over trunk Pull over shirt/dress - Perfomed by helper: Thread/unthread right sleeve        Upper body assist Assist Level: Touching or steadying assistance(Pt > 75%)      Lower Body Dressing/Undressing Lower body dressing   What is the patient wearing?: Pants, Socks, Shoes     Pants- Performed by patient: Thread/unthread right pants leg, Thread/unthread left pants leg Pants- Performed by helper: Pull pants up/down   Non-skid slipper socks- Performed by helper: Don/doff right sock, Don/doff left sock Socks - Performed by patient: Don/doff left sock Socks - Performed by helper: Don/doff right sock, Don/doff left sock Shoes - Performed by patient: Don/doff left shoe, Don/doff right shoe Shoes - Performed by helper: Fasten right, Fasten left          Lower body assist Assist for lower body dressing:  Touching or steadying assistance (Pt > 75%)      Toileting Toileting Toileting activity did not occur: No continent bowel/bladder event Toileting steps completed by patient: Performs perineal hygiene, Adjust clothing prior to toileting Toileting steps completed by helper: Adjust clothing after toileting Toileting Assistive Devices: Grab bar or rail  Toileting assist Assist level: Touching or steadying assistance (Pt.75%)   Transfers Chair/bed transfer    Chair/bed transfer method: Ambulatory Chair/bed transfer assist level: Touching or steadying assistance (Pt > 75%) Chair/bed transfer assistive device: Armrests, Medical sales representative Ambulation activity did not occur: Safety/medical concerns   Max distance: 150' Assist level: Supervision or verbal cues   Wheelchair   Type: Manual Max wheelchair distance: 150' Assist Level: Supervision or verbal cues  Cognition Comprehension Comprehension assist level: Understands basic 75 - 89% of the time/ requires cueing 10 - 24% of the time  Expression Expression assist level: Expresses basic 75 - 89% of the time/requires cueing 10 - 24% of the time. Needs helper to occlude trach/needs to repeat words.  Social Interaction Social Interaction assist level: Interacts appropriately 75 - 89% of the time - Needs redirection for appropriate language or to initiate interaction.  Problem Solving Problem solving assist level: Solves basic 50 - 74% of the time/requires cueing 25 - 49% of the time  Memory Memory assist level: Recognizes or recalls 25 - 49% of the time/requires cueing 50 - 75% of the time    Medical Problem List and Plan: 1.  Hemiparesis, aphasia, dysphagia secondary to Left MCA infarct.   Continue CIR   WHO    Stable for SNF today 2.  DVT Prophylaxis/Anticoagulation: Pharmaceutical: Other (comment)--Eliquis 3. Pain Management: Tylenol prn  4. Mood: LCSW to follow for evaluation and support.    Lexapro started on 12/27 5. Neuropsych: This patient is not fully capable of making decisions on her own behalf. 6. Skin/Wound Care: Pressure relief measures.Maintain adequate hydration/nutritional status.  7. Fluids/Electrolytes/Nutrition: Adjust tube feeds to maintain adequate nutritional status. Consulted dietitian for input and adjustment.    BMP within acceptable range on 12/24   TFs Tolerates smaller more freq boluses 8. RUL/RLL aspiration PNA   Scheduled Robitussin-DM to help  with cough and secretions    Repeat chest x-ray reviewed, showing persistent right upper lobe disease, but resolution of right lower lobe infiltrate. Per Pulm, pro calcitonin ordered, reviewed, WNL, antibiotics DC'd. No need for further imaging at this time. Follow-up CT in 3 months   Repeat chest x-ray reviewed, showing stable abnormality.   Continue to monitor 9. ?CKD stage III: Monitor renal status with routine checks. Water flushes via PEG.    Creatinine 0.81 on 12/31   Labs pending   Appears to be controlled with adequate hydration 10 Recent GIB: Back on Eliquis.    Hemoglobin 11.8 on 12/31   Labs pending   Continue to monitor 11. Seizure disorder: stable/controlled on Keppra. 12. A Fib: Monitor HR bid. Continue metoprolol and decrease ASA to 81 mg/day.  Vitals:   10/23/17 2305 10/24/17 0100  BP:  (!) 146/67  Pulse:  71  Resp:  18  Temp:  98 F (36.7 C)  SpO2: 96% 98%     Heart rate controlled on 1/7, appreciate cardiology consult 13. Thrombocytosis: Resolved   Likely reactive.    Will continue to monitor.  14. Dysphagia: NPO (holding bite trials with ST), tolerating tube feeds 15. Protein calorie malnutrition: Chronic with baseline albumin around 2.5 in the past 6  months.    Pre-albumin 16.9 on 12/13.    Continue to monitor 16. COPD (per X ray)/RUL bronchogenic lung UJ:WJXB still demonstrates lesion completed XRT 06/2017 17. Hypokalemia: Resolved   Continue to monitor 18. Leukocytosis: Resolved  Cont to monitor 19. Hyperglycemia   Secondary to tube feeds, resolved with bolus feeds, CBGs d/ced on 12/31 20. Urinary frequency- UA positive UCx contaminated with multi species will check cath specimen  Repeat C and S pending 21. Elevated blood pressure  Overall controlled on 1/2 22. Vomiting resolved  LOS (Days) 26 A FACE TO FACE EVALUATION WAS PERFORMED  Charlett Blake 10/24/2017 7:13 AM

## 2017-10-24 NOTE — Progress Notes (Signed)
Patient discharged via ambulance. No complaints of pain at this time, no questions. All belongings packed and sent along with patient.

## 2017-10-24 NOTE — Progress Notes (Signed)
Social Work  Discharge Note  The overall goal for the admission was met for:   Discharge location: No-[facility]  Length of Stay: No-26 DAYS  Discharge activity level: Yes-MIN LEVEL  Home/community participation: Yes  Services provided included: MD, RD, PT, OT, SLP, RN, CM, TR, Pharmacy, Neuropsych and SW  Financial Services: Medicare and Private Insurance: [place]  Follow-up services arranged: Other: NHP  Comments (or additional information):DAUGHTER AND SON DO NOT FEEL THEY CAN PROVIDE THE LEVEL OF CARE PT REQUIRES AT THIS TIME. HAVE CHOSEN SHORT TERM NHP. BOTH ARE VERY INVOLVED IN MOM'S CARE AND AT TIMES UNREALISTIC.  Patient/Family verbalized understanding of follow-up arrangements: Yes  Individual responsible for coordination of the follow-up plan: MICHELLE-DAUGHTER  Confirmed correct DME delivered: Elease Hashimoto 10/24/2017    Elease Hashimoto

## 2017-10-25 ENCOUNTER — Telehealth (HOSPITAL_COMMUNITY): Payer: Self-pay | Admitting: Physical Medicine and Rehabilitation

## 2017-10-25 ENCOUNTER — Telehealth: Payer: Self-pay | Admitting: *Deleted

## 2017-10-25 NOTE — Progress Notes (Signed)
Results of culture faxed to [facility] with recommendations to have MD to follow up.

## 2017-10-25 NOTE — Telephone Encounter (Signed)
Urine culture positive and pending. SW to fax results to [facility] for MD there to follow up.

## 2017-10-25 NOTE — Telephone Encounter (Signed)
Pt was on TCM list admitted 09/28/17 for Acute ischemic left MCA stroke. Family is unable to provide care needed and has elected on SNF for progressive therapy. She was discharged to [name] on 10/24/2016...Kayla Kent

## 2017-10-26 DIAGNOSIS — K219 Gastro-esophageal reflux disease without esophagitis: Secondary | ICD-10-CM | POA: Diagnosis not present

## 2017-10-26 DIAGNOSIS — M6281 Muscle weakness (generalized): Secondary | ICD-10-CM | POA: Diagnosis not present

## 2017-10-26 DIAGNOSIS — E785 Hyperlipidemia, unspecified: Secondary | ICD-10-CM | POA: Diagnosis not present

## 2017-10-26 DIAGNOSIS — N390 Urinary tract infection, site not specified: Secondary | ICD-10-CM | POA: Diagnosis not present

## 2017-10-26 DIAGNOSIS — I10 Essential (primary) hypertension: Secondary | ICD-10-CM | POA: Diagnosis not present

## 2017-10-26 DIAGNOSIS — R52 Pain, unspecified: Secondary | ICD-10-CM | POA: Diagnosis not present

## 2017-10-26 DIAGNOSIS — R1319 Other dysphagia: Secondary | ICD-10-CM | POA: Diagnosis not present

## 2017-10-26 DIAGNOSIS — I639 Cerebral infarction, unspecified: Secondary | ICD-10-CM | POA: Diagnosis not present

## 2017-10-26 DIAGNOSIS — J189 Pneumonia, unspecified organism: Secondary | ICD-10-CM | POA: Diagnosis not present

## 2017-10-26 DIAGNOSIS — I4891 Unspecified atrial fibrillation: Secondary | ICD-10-CM | POA: Diagnosis not present

## 2017-10-26 DIAGNOSIS — R338 Other retention of urine: Secondary | ICD-10-CM | POA: Diagnosis not present

## 2017-10-26 DIAGNOSIS — I69354 Hemiplegia and hemiparesis following cerebral infarction affecting left non-dominant side: Secondary | ICD-10-CM | POA: Diagnosis not present

## 2017-10-26 LAB — URINE CULTURE

## 2017-10-26 NOTE — Telephone Encounter (Signed)
lmtcb X3 for patient.

## 2017-10-27 ENCOUNTER — Telehealth (HOSPITAL_COMMUNITY): Payer: Self-pay

## 2017-10-27 NOTE — Telephone Encounter (Signed)
Called to schedule stroke f/u, left message for pt to return call. AW

## 2017-10-28 DIAGNOSIS — M6281 Muscle weakness (generalized): Secondary | ICD-10-CM | POA: Diagnosis not present

## 2017-10-28 DIAGNOSIS — I10 Essential (primary) hypertension: Secondary | ICD-10-CM | POA: Diagnosis not present

## 2017-10-28 DIAGNOSIS — R338 Other retention of urine: Secondary | ICD-10-CM | POA: Diagnosis not present

## 2017-10-28 DIAGNOSIS — I69354 Hemiplegia and hemiparesis following cerebral infarction affecting left non-dominant side: Secondary | ICD-10-CM | POA: Diagnosis not present

## 2017-10-28 DIAGNOSIS — E785 Hyperlipidemia, unspecified: Secondary | ICD-10-CM | POA: Diagnosis not present

## 2017-10-28 DIAGNOSIS — I639 Cerebral infarction, unspecified: Secondary | ICD-10-CM | POA: Diagnosis not present

## 2017-10-28 DIAGNOSIS — J189 Pneumonia, unspecified organism: Secondary | ICD-10-CM | POA: Diagnosis not present

## 2017-10-28 DIAGNOSIS — R52 Pain, unspecified: Secondary | ICD-10-CM | POA: Diagnosis not present

## 2017-10-28 DIAGNOSIS — K219 Gastro-esophageal reflux disease without esophagitis: Secondary | ICD-10-CM | POA: Diagnosis not present

## 2017-10-28 DIAGNOSIS — R1319 Other dysphagia: Secondary | ICD-10-CM | POA: Diagnosis not present

## 2017-10-28 DIAGNOSIS — N390 Urinary tract infection, site not specified: Secondary | ICD-10-CM | POA: Diagnosis not present

## 2017-10-28 DIAGNOSIS — I4891 Unspecified atrial fibrillation: Secondary | ICD-10-CM | POA: Diagnosis not present

## 2017-10-28 NOTE — Telephone Encounter (Signed)
lmtcb for pt.  

## 2017-10-31 DIAGNOSIS — R1319 Other dysphagia: Secondary | ICD-10-CM | POA: Diagnosis not present

## 2017-10-31 DIAGNOSIS — J189 Pneumonia, unspecified organism: Secondary | ICD-10-CM | POA: Diagnosis not present

## 2017-10-31 DIAGNOSIS — I10 Essential (primary) hypertension: Secondary | ICD-10-CM | POA: Diagnosis not present

## 2017-10-31 DIAGNOSIS — E785 Hyperlipidemia, unspecified: Secondary | ICD-10-CM | POA: Diagnosis not present

## 2017-10-31 DIAGNOSIS — N390 Urinary tract infection, site not specified: Secondary | ICD-10-CM | POA: Diagnosis not present

## 2017-10-31 DIAGNOSIS — I4891 Unspecified atrial fibrillation: Secondary | ICD-10-CM | POA: Diagnosis not present

## 2017-10-31 DIAGNOSIS — I639 Cerebral infarction, unspecified: Secondary | ICD-10-CM | POA: Diagnosis not present

## 2017-10-31 DIAGNOSIS — I69354 Hemiplegia and hemiparesis following cerebral infarction affecting left non-dominant side: Secondary | ICD-10-CM | POA: Diagnosis not present

## 2017-10-31 DIAGNOSIS — M6281 Muscle weakness (generalized): Secondary | ICD-10-CM | POA: Diagnosis not present

## 2017-10-31 DIAGNOSIS — R52 Pain, unspecified: Secondary | ICD-10-CM | POA: Diagnosis not present

## 2017-10-31 DIAGNOSIS — J449 Chronic obstructive pulmonary disease, unspecified: Secondary | ICD-10-CM | POA: Diagnosis not present

## 2017-10-31 DIAGNOSIS — R338 Other retention of urine: Secondary | ICD-10-CM | POA: Diagnosis not present

## 2017-11-01 ENCOUNTER — Telehealth: Payer: Self-pay

## 2017-11-01 NOTE — Telephone Encounter (Signed)
lmtcb x1 for pt's son. 

## 2017-11-01 NOTE — Telephone Encounter (Signed)
Please see 11/01/17 phone note

## 2017-11-01 NOTE — Telephone Encounter (Signed)
Pt is calling back [phone]

## 2017-11-01 NOTE — Telephone Encounter (Signed)
Spoke with son, he just wanted to make sure pt;s appt was cancelled. Lesleigh Noe was there anything else we needed before I close encounter. He did not understand why we were calling him.

## 2017-11-02 DIAGNOSIS — J449 Chronic obstructive pulmonary disease, unspecified: Secondary | ICD-10-CM | POA: Diagnosis not present

## 2017-11-02 DIAGNOSIS — I639 Cerebral infarction, unspecified: Secondary | ICD-10-CM | POA: Diagnosis not present

## 2017-11-02 DIAGNOSIS — I4891 Unspecified atrial fibrillation: Secondary | ICD-10-CM | POA: Diagnosis not present

## 2017-11-02 DIAGNOSIS — R1319 Other dysphagia: Secondary | ICD-10-CM | POA: Diagnosis not present

## 2017-11-02 DIAGNOSIS — R52 Pain, unspecified: Secondary | ICD-10-CM | POA: Diagnosis not present

## 2017-11-02 DIAGNOSIS — M6281 Muscle weakness (generalized): Secondary | ICD-10-CM | POA: Diagnosis not present

## 2017-11-02 DIAGNOSIS — R338 Other retention of urine: Secondary | ICD-10-CM | POA: Diagnosis not present

## 2017-11-02 DIAGNOSIS — I69354 Hemiplegia and hemiparesis following cerebral infarction affecting left non-dominant side: Secondary | ICD-10-CM | POA: Diagnosis not present

## 2017-11-02 DIAGNOSIS — I10 Essential (primary) hypertension: Secondary | ICD-10-CM | POA: Diagnosis not present

## 2017-11-02 DIAGNOSIS — J189 Pneumonia, unspecified organism: Secondary | ICD-10-CM | POA: Diagnosis not present

## 2017-11-02 DIAGNOSIS — N390 Urinary tract infection, site not specified: Secondary | ICD-10-CM | POA: Diagnosis not present

## 2017-11-02 DIAGNOSIS — K5900 Constipation, unspecified: Secondary | ICD-10-CM | POA: Diagnosis not present

## 2017-11-02 NOTE — Telephone Encounter (Signed)
Please refer to 10/13/17 phone.  PM has requested that pt be scheduled for hosp f/u with CT prior. It does not appear that pt had hosp f/u scheduled with our office.  Lm to make pt aware of this information

## 2017-11-03 NOTE — Telephone Encounter (Signed)
lmtcb x2 for pt's son.

## 2017-11-04 DIAGNOSIS — N390 Urinary tract infection, site not specified: Secondary | ICD-10-CM | POA: Diagnosis not present

## 2017-11-04 NOTE — Telephone Encounter (Signed)
lmtcb x3 for pt's son.

## 2017-11-07 DIAGNOSIS — M6281 Muscle weakness (generalized): Secondary | ICD-10-CM | POA: Diagnosis not present

## 2017-11-07 DIAGNOSIS — I639 Cerebral infarction, unspecified: Secondary | ICD-10-CM | POA: Diagnosis not present

## 2017-11-07 DIAGNOSIS — I69354 Hemiplegia and hemiparesis following cerebral infarction affecting left non-dominant side: Secondary | ICD-10-CM | POA: Diagnosis not present

## 2017-11-07 DIAGNOSIS — R52 Pain, unspecified: Secondary | ICD-10-CM | POA: Diagnosis not present

## 2017-11-08 DIAGNOSIS — N390 Urinary tract infection, site not specified: Secondary | ICD-10-CM | POA: Diagnosis not present

## 2017-11-08 DIAGNOSIS — R338 Other retention of urine: Secondary | ICD-10-CM | POA: Diagnosis not present

## 2017-11-08 DIAGNOSIS — R1319 Other dysphagia: Secondary | ICD-10-CM | POA: Diagnosis not present

## 2017-11-08 DIAGNOSIS — I639 Cerebral infarction, unspecified: Secondary | ICD-10-CM | POA: Diagnosis not present

## 2017-11-08 DIAGNOSIS — M6281 Muscle weakness (generalized): Secondary | ICD-10-CM | POA: Diagnosis not present

## 2017-11-08 DIAGNOSIS — I69354 Hemiplegia and hemiparesis following cerebral infarction affecting left non-dominant side: Secondary | ICD-10-CM | POA: Diagnosis not present

## 2017-11-08 DIAGNOSIS — I10 Essential (primary) hypertension: Secondary | ICD-10-CM | POA: Diagnosis not present

## 2017-11-08 DIAGNOSIS — K5900 Constipation, unspecified: Secondary | ICD-10-CM | POA: Diagnosis not present

## 2017-11-08 DIAGNOSIS — J189 Pneumonia, unspecified organism: Secondary | ICD-10-CM | POA: Diagnosis not present

## 2017-11-08 DIAGNOSIS — I4891 Unspecified atrial fibrillation: Secondary | ICD-10-CM | POA: Diagnosis not present

## 2017-11-08 DIAGNOSIS — J449 Chronic obstructive pulmonary disease, unspecified: Secondary | ICD-10-CM | POA: Diagnosis not present

## 2017-11-08 DIAGNOSIS — R52 Pain, unspecified: Secondary | ICD-10-CM | POA: Diagnosis not present

## 2017-11-22 DIAGNOSIS — R634 Abnormal weight loss: Secondary | ICD-10-CM | POA: Diagnosis not present

## 2017-11-23 ENCOUNTER — Inpatient Hospital Stay: Payer: Self-pay | Admitting: Physical Medicine & Rehabilitation

## 2017-11-23 DIAGNOSIS — F419 Anxiety disorder, unspecified: Secondary | ICD-10-CM | POA: Diagnosis not present

## 2017-11-23 DIAGNOSIS — F331 Major depressive disorder, recurrent, moderate: Secondary | ICD-10-CM | POA: Diagnosis not present

## 2017-11-23 DIAGNOSIS — G4700 Insomnia, unspecified: Secondary | ICD-10-CM | POA: Diagnosis not present

## 2017-11-24 ENCOUNTER — Telehealth: Payer: Self-pay | Admitting: Pulmonary Disease

## 2017-11-24 DIAGNOSIS — M6281 Muscle weakness (generalized): Secondary | ICD-10-CM | POA: Diagnosis not present

## 2017-11-24 DIAGNOSIS — I69354 Hemiplegia and hemiparesis following cerebral infarction affecting left non-dominant side: Secondary | ICD-10-CM | POA: Diagnosis not present

## 2017-11-24 DIAGNOSIS — I639 Cerebral infarction, unspecified: Secondary | ICD-10-CM | POA: Diagnosis not present

## 2017-11-24 NOTE — Telephone Encounter (Signed)
The follow up CT and appointment is to check on her pneumonia, COPD. Also need to check right lung nodule that was treated with radiation, to make sure if is not getting bigger. We can hold off if she is too weak after the stroke.    She has a CT chest ordered for May 2019 by radiation oncology. Ask her to keep that appointment.  Marshell Garfinkel MD [facility] Pager [phone] If no answer or after 3pm call: [phone] 11/24/2017, 4:25 PM

## 2017-11-24 NOTE — Telephone Encounter (Signed)
Called and spoke with patients daughter, she is going to cancel appointment for now and call back once she can come in.

## 2017-11-24 NOTE — Telephone Encounter (Signed)
Please see 11/01/17 phone.  Spoke with pt's daughter, [name].  [name] states that pt is currently in rehab at [facility]. [name] is wanting to know how important HFU is and why f/u is needed. I explained to [name] that apt is needed to f/u on pt's dx of PNA during admission.  [name] is wanting to know if apt is necessary as it is difficult to transport pt, due to weakness from recent stroke.   PM please advise. Thanks.

## 2017-11-28 DIAGNOSIS — N390 Urinary tract infection, site not specified: Secondary | ICD-10-CM | POA: Diagnosis not present

## 2017-11-28 DIAGNOSIS — I4891 Unspecified atrial fibrillation: Secondary | ICD-10-CM | POA: Diagnosis not present

## 2017-11-28 DIAGNOSIS — J449 Chronic obstructive pulmonary disease, unspecified: Secondary | ICD-10-CM | POA: Diagnosis not present

## 2017-11-28 DIAGNOSIS — I639 Cerebral infarction, unspecified: Secondary | ICD-10-CM | POA: Diagnosis not present

## 2017-11-28 DIAGNOSIS — R52 Pain, unspecified: Secondary | ICD-10-CM | POA: Diagnosis not present

## 2017-11-28 DIAGNOSIS — R1319 Other dysphagia: Secondary | ICD-10-CM | POA: Diagnosis not present

## 2017-11-28 DIAGNOSIS — I10 Essential (primary) hypertension: Secondary | ICD-10-CM | POA: Diagnosis not present

## 2017-11-28 DIAGNOSIS — I69354 Hemiplegia and hemiparesis following cerebral infarction affecting left non-dominant side: Secondary | ICD-10-CM | POA: Diagnosis not present

## 2017-11-28 DIAGNOSIS — J189 Pneumonia, unspecified organism: Secondary | ICD-10-CM | POA: Diagnosis not present

## 2017-11-28 DIAGNOSIS — M6281 Muscle weakness (generalized): Secondary | ICD-10-CM | POA: Diagnosis not present

## 2017-11-28 DIAGNOSIS — K5900 Constipation, unspecified: Secondary | ICD-10-CM | POA: Diagnosis not present

## 2017-11-28 DIAGNOSIS — R338 Other retention of urine: Secondary | ICD-10-CM | POA: Diagnosis not present

## 2017-11-30 DIAGNOSIS — Z043 Encounter for examination and observation following other accident: Secondary | ICD-10-CM | POA: Diagnosis not present

## 2017-11-30 DIAGNOSIS — W19XXXA Unspecified fall, initial encounter: Secondary | ICD-10-CM | POA: Diagnosis not present

## 2017-12-06 DIAGNOSIS — R0602 Shortness of breath: Secondary | ICD-10-CM | POA: Diagnosis not present

## 2017-12-06 DIAGNOSIS — F419 Anxiety disorder, unspecified: Secondary | ICD-10-CM | POA: Diagnosis not present

## 2017-12-13 DIAGNOSIS — R1312 Dysphagia, oropharyngeal phase: Secondary | ICD-10-CM | POA: Diagnosis not present

## 2017-12-13 DIAGNOSIS — R41841 Cognitive communication deficit: Secondary | ICD-10-CM | POA: Diagnosis not present

## 2017-12-14 DIAGNOSIS — R41841 Cognitive communication deficit: Secondary | ICD-10-CM | POA: Diagnosis not present

## 2017-12-14 DIAGNOSIS — R1312 Dysphagia, oropharyngeal phase: Secondary | ICD-10-CM | POA: Diagnosis not present

## 2017-12-15 DIAGNOSIS — R41841 Cognitive communication deficit: Secondary | ICD-10-CM | POA: Diagnosis not present

## 2017-12-15 DIAGNOSIS — R1312 Dysphagia, oropharyngeal phase: Secondary | ICD-10-CM | POA: Diagnosis not present

## 2017-12-16 DIAGNOSIS — R41841 Cognitive communication deficit: Secondary | ICD-10-CM | POA: Diagnosis not present

## 2017-12-16 DIAGNOSIS — R1312 Dysphagia, oropharyngeal phase: Secondary | ICD-10-CM | POA: Diagnosis not present

## 2017-12-19 DIAGNOSIS — R1312 Dysphagia, oropharyngeal phase: Secondary | ICD-10-CM | POA: Diagnosis not present

## 2017-12-19 DIAGNOSIS — R41841 Cognitive communication deficit: Secondary | ICD-10-CM | POA: Diagnosis not present

## 2017-12-20 DIAGNOSIS — R41841 Cognitive communication deficit: Secondary | ICD-10-CM | POA: Diagnosis not present

## 2017-12-20 DIAGNOSIS — R1312 Dysphagia, oropharyngeal phase: Secondary | ICD-10-CM | POA: Diagnosis not present

## 2017-12-21 DIAGNOSIS — R41841 Cognitive communication deficit: Secondary | ICD-10-CM | POA: Diagnosis not present

## 2017-12-21 DIAGNOSIS — R1312 Dysphagia, oropharyngeal phase: Secondary | ICD-10-CM | POA: Diagnosis not present

## 2017-12-22 DIAGNOSIS — R1312 Dysphagia, oropharyngeal phase: Secondary | ICD-10-CM | POA: Diagnosis not present

## 2017-12-22 DIAGNOSIS — R41841 Cognitive communication deficit: Secondary | ICD-10-CM | POA: Diagnosis not present

## 2017-12-23 DIAGNOSIS — R41841 Cognitive communication deficit: Secondary | ICD-10-CM | POA: Diagnosis not present

## 2017-12-23 DIAGNOSIS — R1312 Dysphagia, oropharyngeal phase: Secondary | ICD-10-CM | POA: Diagnosis not present

## 2017-12-26 DIAGNOSIS — R1312 Dysphagia, oropharyngeal phase: Secondary | ICD-10-CM | POA: Diagnosis not present

## 2017-12-26 DIAGNOSIS — R41841 Cognitive communication deficit: Secondary | ICD-10-CM | POA: Diagnosis not present

## 2017-12-27 ENCOUNTER — Other Ambulatory Visit: Payer: Self-pay

## 2017-12-27 DIAGNOSIS — I639 Cerebral infarction, unspecified: Secondary | ICD-10-CM | POA: Diagnosis not present

## 2017-12-27 DIAGNOSIS — R52 Pain, unspecified: Secondary | ICD-10-CM | POA: Diagnosis not present

## 2017-12-27 DIAGNOSIS — J449 Chronic obstructive pulmonary disease, unspecified: Secondary | ICD-10-CM | POA: Diagnosis not present

## 2017-12-27 DIAGNOSIS — R41841 Cognitive communication deficit: Secondary | ICD-10-CM | POA: Diagnosis not present

## 2017-12-27 DIAGNOSIS — N390 Urinary tract infection, site not specified: Secondary | ICD-10-CM | POA: Diagnosis not present

## 2017-12-27 DIAGNOSIS — K219 Gastro-esophageal reflux disease without esophagitis: Secondary | ICD-10-CM | POA: Diagnosis not present

## 2017-12-27 DIAGNOSIS — K5900 Constipation, unspecified: Secondary | ICD-10-CM | POA: Diagnosis not present

## 2017-12-27 DIAGNOSIS — R1312 Dysphagia, oropharyngeal phase: Secondary | ICD-10-CM | POA: Diagnosis not present

## 2017-12-27 DIAGNOSIS — I4891 Unspecified atrial fibrillation: Secondary | ICD-10-CM | POA: Diagnosis not present

## 2017-12-27 DIAGNOSIS — I10 Essential (primary) hypertension: Secondary | ICD-10-CM | POA: Diagnosis not present

## 2017-12-27 DIAGNOSIS — E785 Hyperlipidemia, unspecified: Secondary | ICD-10-CM | POA: Diagnosis not present

## 2017-12-27 DIAGNOSIS — R338 Other retention of urine: Secondary | ICD-10-CM | POA: Diagnosis not present

## 2017-12-27 DIAGNOSIS — M6281 Muscle weakness (generalized): Secondary | ICD-10-CM | POA: Diagnosis not present

## 2017-12-27 DIAGNOSIS — R635 Abnormal weight gain: Secondary | ICD-10-CM | POA: Diagnosis not present

## 2017-12-28 ENCOUNTER — Other Ambulatory Visit: Payer: Self-pay

## 2017-12-28 DIAGNOSIS — R1312 Dysphagia, oropharyngeal phase: Secondary | ICD-10-CM | POA: Diagnosis not present

## 2017-12-28 DIAGNOSIS — R41841 Cognitive communication deficit: Secondary | ICD-10-CM | POA: Diagnosis not present

## 2017-12-28 NOTE — Patient Outreach (Signed)
Telephone outreach to patient to obtain mRS was successfully completed. mRS = 5

## 2017-12-29 DIAGNOSIS — R41841 Cognitive communication deficit: Secondary | ICD-10-CM | POA: Diagnosis not present

## 2017-12-29 DIAGNOSIS — R1312 Dysphagia, oropharyngeal phase: Secondary | ICD-10-CM | POA: Diagnosis not present

## 2017-12-29 DIAGNOSIS — F331 Major depressive disorder, recurrent, moderate: Secondary | ICD-10-CM | POA: Diagnosis not present

## 2017-12-29 DIAGNOSIS — F419 Anxiety disorder, unspecified: Secondary | ICD-10-CM | POA: Diagnosis not present

## 2017-12-29 DIAGNOSIS — G4700 Insomnia, unspecified: Secondary | ICD-10-CM | POA: Diagnosis not present

## 2017-12-30 DIAGNOSIS — R1312 Dysphagia, oropharyngeal phase: Secondary | ICD-10-CM | POA: Diagnosis not present

## 2017-12-30 DIAGNOSIS — R41841 Cognitive communication deficit: Secondary | ICD-10-CM | POA: Diagnosis not present

## 2018-01-02 DIAGNOSIS — R41841 Cognitive communication deficit: Secondary | ICD-10-CM | POA: Diagnosis not present

## 2018-01-02 DIAGNOSIS — R1312 Dysphagia, oropharyngeal phase: Secondary | ICD-10-CM | POA: Diagnosis not present

## 2018-01-03 DIAGNOSIS — R1312 Dysphagia, oropharyngeal phase: Secondary | ICD-10-CM | POA: Diagnosis not present

## 2018-01-03 DIAGNOSIS — R41841 Cognitive communication deficit: Secondary | ICD-10-CM | POA: Diagnosis not present

## 2018-01-04 DIAGNOSIS — R41841 Cognitive communication deficit: Secondary | ICD-10-CM | POA: Diagnosis not present

## 2018-01-04 DIAGNOSIS — R1312 Dysphagia, oropharyngeal phase: Secondary | ICD-10-CM | POA: Diagnosis not present

## 2018-01-05 DIAGNOSIS — R1312 Dysphagia, oropharyngeal phase: Secondary | ICD-10-CM | POA: Diagnosis not present

## 2018-01-05 DIAGNOSIS — R41841 Cognitive communication deficit: Secondary | ICD-10-CM | POA: Diagnosis not present

## 2018-01-06 DIAGNOSIS — R41841 Cognitive communication deficit: Secondary | ICD-10-CM | POA: Diagnosis not present

## 2018-01-06 DIAGNOSIS — R1312 Dysphagia, oropharyngeal phase: Secondary | ICD-10-CM | POA: Diagnosis not present

## 2018-01-09 DIAGNOSIS — R1312 Dysphagia, oropharyngeal phase: Secondary | ICD-10-CM | POA: Diagnosis not present

## 2018-01-09 DIAGNOSIS — R41841 Cognitive communication deficit: Secondary | ICD-10-CM | POA: Diagnosis not present

## 2018-01-10 DIAGNOSIS — R41841 Cognitive communication deficit: Secondary | ICD-10-CM | POA: Diagnosis not present

## 2018-01-10 DIAGNOSIS — R1312 Dysphagia, oropharyngeal phase: Secondary | ICD-10-CM | POA: Diagnosis not present

## 2018-01-11 DIAGNOSIS — R1310 Dysphagia, unspecified: Secondary | ICD-10-CM | POA: Diagnosis not present

## 2018-01-11 DIAGNOSIS — R1312 Dysphagia, oropharyngeal phase: Secondary | ICD-10-CM | POA: Diagnosis not present

## 2018-01-11 DIAGNOSIS — R41841 Cognitive communication deficit: Secondary | ICD-10-CM | POA: Diagnosis not present

## 2018-01-11 DIAGNOSIS — M6281 Muscle weakness (generalized): Secondary | ICD-10-CM | POA: Diagnosis not present

## 2018-01-11 DIAGNOSIS — I639 Cerebral infarction, unspecified: Secondary | ICD-10-CM | POA: Diagnosis not present

## 2018-01-12 DIAGNOSIS — R1312 Dysphagia, oropharyngeal phase: Secondary | ICD-10-CM | POA: Diagnosis not present

## 2018-01-12 DIAGNOSIS — R41841 Cognitive communication deficit: Secondary | ICD-10-CM | POA: Diagnosis not present

## 2018-01-13 DIAGNOSIS — R41841 Cognitive communication deficit: Secondary | ICD-10-CM | POA: Diagnosis not present

## 2018-01-13 DIAGNOSIS — R1312 Dysphagia, oropharyngeal phase: Secondary | ICD-10-CM | POA: Diagnosis not present

## 2018-01-16 DIAGNOSIS — R278 Other lack of coordination: Secondary | ICD-10-CM | POA: Diagnosis not present

## 2018-01-16 DIAGNOSIS — R1312 Dysphagia, oropharyngeal phase: Secondary | ICD-10-CM | POA: Diagnosis not present

## 2018-01-16 DIAGNOSIS — R41841 Cognitive communication deficit: Secondary | ICD-10-CM | POA: Diagnosis not present

## 2018-01-16 DIAGNOSIS — R279 Unspecified lack of coordination: Secondary | ICD-10-CM | POA: Diagnosis not present

## 2018-01-18 DIAGNOSIS — R1312 Dysphagia, oropharyngeal phase: Secondary | ICD-10-CM | POA: Diagnosis not present

## 2018-01-18 DIAGNOSIS — R41841 Cognitive communication deficit: Secondary | ICD-10-CM | POA: Diagnosis not present

## 2018-01-18 DIAGNOSIS — R279 Unspecified lack of coordination: Secondary | ICD-10-CM | POA: Diagnosis not present

## 2018-01-18 DIAGNOSIS — R278 Other lack of coordination: Secondary | ICD-10-CM | POA: Diagnosis not present

## 2018-01-19 DIAGNOSIS — R1312 Dysphagia, oropharyngeal phase: Secondary | ICD-10-CM | POA: Diagnosis not present

## 2018-01-19 DIAGNOSIS — R41841 Cognitive communication deficit: Secondary | ICD-10-CM | POA: Diagnosis not present

## 2018-01-19 DIAGNOSIS — R278 Other lack of coordination: Secondary | ICD-10-CM | POA: Diagnosis not present

## 2018-01-19 DIAGNOSIS — R279 Unspecified lack of coordination: Secondary | ICD-10-CM | POA: Diagnosis not present

## 2018-01-20 DIAGNOSIS — R41841 Cognitive communication deficit: Secondary | ICD-10-CM | POA: Diagnosis not present

## 2018-01-20 DIAGNOSIS — R279 Unspecified lack of coordination: Secondary | ICD-10-CM | POA: Diagnosis not present

## 2018-01-20 DIAGNOSIS — R278 Other lack of coordination: Secondary | ICD-10-CM | POA: Diagnosis not present

## 2018-01-20 DIAGNOSIS — R1312 Dysphagia, oropharyngeal phase: Secondary | ICD-10-CM | POA: Diagnosis not present

## 2018-01-23 DIAGNOSIS — R278 Other lack of coordination: Secondary | ICD-10-CM | POA: Diagnosis not present

## 2018-01-23 DIAGNOSIS — R41841 Cognitive communication deficit: Secondary | ICD-10-CM | POA: Diagnosis not present

## 2018-01-23 DIAGNOSIS — R1312 Dysphagia, oropharyngeal phase: Secondary | ICD-10-CM | POA: Diagnosis not present

## 2018-01-23 DIAGNOSIS — R279 Unspecified lack of coordination: Secondary | ICD-10-CM | POA: Diagnosis not present

## 2018-01-24 DIAGNOSIS — I639 Cerebral infarction, unspecified: Secondary | ICD-10-CM | POA: Diagnosis not present

## 2018-01-24 DIAGNOSIS — R338 Other retention of urine: Secondary | ICD-10-CM | POA: Diagnosis not present

## 2018-01-24 DIAGNOSIS — K219 Gastro-esophageal reflux disease without esophagitis: Secondary | ICD-10-CM | POA: Diagnosis not present

## 2018-01-24 DIAGNOSIS — I10 Essential (primary) hypertension: Secondary | ICD-10-CM | POA: Diagnosis not present

## 2018-01-24 DIAGNOSIS — R52 Pain, unspecified: Secondary | ICD-10-CM | POA: Diagnosis not present

## 2018-01-24 DIAGNOSIS — E785 Hyperlipidemia, unspecified: Secondary | ICD-10-CM | POA: Diagnosis not present

## 2018-01-24 DIAGNOSIS — R279 Unspecified lack of coordination: Secondary | ICD-10-CM | POA: Diagnosis not present

## 2018-01-24 DIAGNOSIS — M6281 Muscle weakness (generalized): Secondary | ICD-10-CM | POA: Diagnosis not present

## 2018-01-24 DIAGNOSIS — J449 Chronic obstructive pulmonary disease, unspecified: Secondary | ICD-10-CM | POA: Diagnosis not present

## 2018-01-24 DIAGNOSIS — I4891 Unspecified atrial fibrillation: Secondary | ICD-10-CM | POA: Diagnosis not present

## 2018-01-24 DIAGNOSIS — J309 Allergic rhinitis, unspecified: Secondary | ICD-10-CM | POA: Diagnosis not present

## 2018-01-24 DIAGNOSIS — R41841 Cognitive communication deficit: Secondary | ICD-10-CM | POA: Diagnosis not present

## 2018-01-24 DIAGNOSIS — R1312 Dysphagia, oropharyngeal phase: Secondary | ICD-10-CM | POA: Diagnosis not present

## 2018-01-24 DIAGNOSIS — K5900 Constipation, unspecified: Secondary | ICD-10-CM | POA: Diagnosis not present

## 2018-01-24 DIAGNOSIS — R278 Other lack of coordination: Secondary | ICD-10-CM | POA: Diagnosis not present

## 2018-01-24 DIAGNOSIS — G4700 Insomnia, unspecified: Secondary | ICD-10-CM | POA: Diagnosis not present

## 2018-01-25 DIAGNOSIS — R279 Unspecified lack of coordination: Secondary | ICD-10-CM | POA: Diagnosis not present

## 2018-01-25 DIAGNOSIS — R41841 Cognitive communication deficit: Secondary | ICD-10-CM | POA: Diagnosis not present

## 2018-01-25 DIAGNOSIS — R1312 Dysphagia, oropharyngeal phase: Secondary | ICD-10-CM | POA: Diagnosis not present

## 2018-01-25 DIAGNOSIS — F419 Anxiety disorder, unspecified: Secondary | ICD-10-CM | POA: Diagnosis not present

## 2018-01-25 DIAGNOSIS — G4700 Insomnia, unspecified: Secondary | ICD-10-CM | POA: Diagnosis not present

## 2018-01-25 DIAGNOSIS — F331 Major depressive disorder, recurrent, moderate: Secondary | ICD-10-CM | POA: Diagnosis not present

## 2018-01-25 DIAGNOSIS — R278 Other lack of coordination: Secondary | ICD-10-CM | POA: Diagnosis not present

## 2018-01-26 DIAGNOSIS — R1312 Dysphagia, oropharyngeal phase: Secondary | ICD-10-CM | POA: Diagnosis not present

## 2018-01-26 DIAGNOSIS — R279 Unspecified lack of coordination: Secondary | ICD-10-CM | POA: Diagnosis not present

## 2018-01-26 DIAGNOSIS — R41841 Cognitive communication deficit: Secondary | ICD-10-CM | POA: Diagnosis not present

## 2018-01-26 DIAGNOSIS — R278 Other lack of coordination: Secondary | ICD-10-CM | POA: Diagnosis not present

## 2018-01-27 DIAGNOSIS — R278 Other lack of coordination: Secondary | ICD-10-CM | POA: Diagnosis not present

## 2018-01-27 DIAGNOSIS — R1312 Dysphagia, oropharyngeal phase: Secondary | ICD-10-CM | POA: Diagnosis not present

## 2018-01-27 DIAGNOSIS — R279 Unspecified lack of coordination: Secondary | ICD-10-CM | POA: Diagnosis not present

## 2018-01-27 DIAGNOSIS — R41841 Cognitive communication deficit: Secondary | ICD-10-CM | POA: Diagnosis not present

## 2018-01-30 DIAGNOSIS — R41841 Cognitive communication deficit: Secondary | ICD-10-CM | POA: Diagnosis not present

## 2018-01-30 DIAGNOSIS — R278 Other lack of coordination: Secondary | ICD-10-CM | POA: Diagnosis not present

## 2018-01-30 DIAGNOSIS — R1312 Dysphagia, oropharyngeal phase: Secondary | ICD-10-CM | POA: Diagnosis not present

## 2018-01-30 DIAGNOSIS — R279 Unspecified lack of coordination: Secondary | ICD-10-CM | POA: Diagnosis not present

## 2018-01-31 DIAGNOSIS — R279 Unspecified lack of coordination: Secondary | ICD-10-CM | POA: Diagnosis not present

## 2018-01-31 DIAGNOSIS — I6312 Cerebral infarction due to embolism of basilar artery: Secondary | ICD-10-CM | POA: Diagnosis not present

## 2018-01-31 DIAGNOSIS — R41841 Cognitive communication deficit: Secondary | ICD-10-CM | POA: Diagnosis not present

## 2018-01-31 DIAGNOSIS — I69354 Hemiplegia and hemiparesis following cerebral infarction affecting left non-dominant side: Secondary | ICD-10-CM | POA: Diagnosis not present

## 2018-01-31 DIAGNOSIS — R278 Other lack of coordination: Secondary | ICD-10-CM | POA: Diagnosis not present

## 2018-01-31 DIAGNOSIS — R1312 Dysphagia, oropharyngeal phase: Secondary | ICD-10-CM | POA: Diagnosis not present

## 2018-02-01 DIAGNOSIS — R278 Other lack of coordination: Secondary | ICD-10-CM | POA: Diagnosis not present

## 2018-02-01 DIAGNOSIS — R41841 Cognitive communication deficit: Secondary | ICD-10-CM | POA: Diagnosis not present

## 2018-02-01 DIAGNOSIS — R1312 Dysphagia, oropharyngeal phase: Secondary | ICD-10-CM | POA: Diagnosis not present

## 2018-02-01 DIAGNOSIS — R279 Unspecified lack of coordination: Secondary | ICD-10-CM | POA: Diagnosis not present

## 2018-02-02 DIAGNOSIS — R279 Unspecified lack of coordination: Secondary | ICD-10-CM | POA: Diagnosis not present

## 2018-02-02 DIAGNOSIS — R1319 Other dysphagia: Secondary | ICD-10-CM | POA: Diagnosis not present

## 2018-02-02 DIAGNOSIS — J449 Chronic obstructive pulmonary disease, unspecified: Secondary | ICD-10-CM | POA: Diagnosis not present

## 2018-02-02 DIAGNOSIS — K5900 Constipation, unspecified: Secondary | ICD-10-CM | POA: Diagnosis not present

## 2018-02-02 DIAGNOSIS — Z85118 Personal history of other malignant neoplasm of bronchus and lung: Secondary | ICD-10-CM | POA: Diagnosis not present

## 2018-02-02 DIAGNOSIS — I4891 Unspecified atrial fibrillation: Secondary | ICD-10-CM | POA: Diagnosis not present

## 2018-02-02 DIAGNOSIS — I69354 Hemiplegia and hemiparesis following cerebral infarction affecting left non-dominant side: Secondary | ICD-10-CM | POA: Diagnosis not present

## 2018-02-02 DIAGNOSIS — I10 Essential (primary) hypertension: Secondary | ICD-10-CM | POA: Diagnosis not present

## 2018-02-02 DIAGNOSIS — R52 Pain, unspecified: Secondary | ICD-10-CM | POA: Diagnosis not present

## 2018-02-02 DIAGNOSIS — I639 Cerebral infarction, unspecified: Secondary | ICD-10-CM | POA: Diagnosis not present

## 2018-02-02 DIAGNOSIS — R41841 Cognitive communication deficit: Secondary | ICD-10-CM | POA: Diagnosis not present

## 2018-02-02 DIAGNOSIS — R1312 Dysphagia, oropharyngeal phase: Secondary | ICD-10-CM | POA: Diagnosis not present

## 2018-02-02 DIAGNOSIS — R278 Other lack of coordination: Secondary | ICD-10-CM | POA: Diagnosis not present

## 2018-02-02 DIAGNOSIS — M6281 Muscle weakness (generalized): Secondary | ICD-10-CM | POA: Diagnosis not present

## 2018-02-02 DIAGNOSIS — R338 Other retention of urine: Secondary | ICD-10-CM | POA: Diagnosis not present

## 2018-02-02 DIAGNOSIS — E785 Hyperlipidemia, unspecified: Secondary | ICD-10-CM | POA: Diagnosis not present

## 2018-02-03 DIAGNOSIS — R41841 Cognitive communication deficit: Secondary | ICD-10-CM | POA: Diagnosis not present

## 2018-02-03 DIAGNOSIS — R1312 Dysphagia, oropharyngeal phase: Secondary | ICD-10-CM | POA: Diagnosis not present

## 2018-02-03 DIAGNOSIS — R279 Unspecified lack of coordination: Secondary | ICD-10-CM | POA: Diagnosis not present

## 2018-02-03 DIAGNOSIS — R278 Other lack of coordination: Secondary | ICD-10-CM | POA: Diagnosis not present

## 2018-02-03 DIAGNOSIS — B309 Viral conjunctivitis, unspecified: Secondary | ICD-10-CM | POA: Diagnosis not present

## 2018-02-03 DIAGNOSIS — H1011 Acute atopic conjunctivitis, right eye: Secondary | ICD-10-CM | POA: Diagnosis not present

## 2018-02-04 DIAGNOSIS — R1312 Dysphagia, oropharyngeal phase: Secondary | ICD-10-CM | POA: Diagnosis not present

## 2018-02-04 DIAGNOSIS — R279 Unspecified lack of coordination: Secondary | ICD-10-CM | POA: Diagnosis not present

## 2018-02-04 DIAGNOSIS — R278 Other lack of coordination: Secondary | ICD-10-CM | POA: Diagnosis not present

## 2018-02-04 DIAGNOSIS — R41841 Cognitive communication deficit: Secondary | ICD-10-CM | POA: Diagnosis not present

## 2018-02-07 DIAGNOSIS — R279 Unspecified lack of coordination: Secondary | ICD-10-CM | POA: Diagnosis not present

## 2018-02-07 DIAGNOSIS — R278 Other lack of coordination: Secondary | ICD-10-CM | POA: Diagnosis not present

## 2018-02-07 DIAGNOSIS — R41841 Cognitive communication deficit: Secondary | ICD-10-CM | POA: Diagnosis not present

## 2018-02-07 DIAGNOSIS — R1312 Dysphagia, oropharyngeal phase: Secondary | ICD-10-CM | POA: Diagnosis not present

## 2018-02-08 DIAGNOSIS — R1312 Dysphagia, oropharyngeal phase: Secondary | ICD-10-CM | POA: Diagnosis not present

## 2018-02-08 DIAGNOSIS — R278 Other lack of coordination: Secondary | ICD-10-CM | POA: Diagnosis not present

## 2018-02-08 DIAGNOSIS — R279 Unspecified lack of coordination: Secondary | ICD-10-CM | POA: Diagnosis not present

## 2018-02-08 DIAGNOSIS — R41841 Cognitive communication deficit: Secondary | ICD-10-CM | POA: Diagnosis not present

## 2018-02-09 DIAGNOSIS — R279 Unspecified lack of coordination: Secondary | ICD-10-CM | POA: Diagnosis not present

## 2018-02-09 DIAGNOSIS — R1312 Dysphagia, oropharyngeal phase: Secondary | ICD-10-CM | POA: Diagnosis not present

## 2018-02-09 DIAGNOSIS — R41841 Cognitive communication deficit: Secondary | ICD-10-CM | POA: Diagnosis not present

## 2018-02-09 DIAGNOSIS — R278 Other lack of coordination: Secondary | ICD-10-CM | POA: Diagnosis not present

## 2018-02-10 DIAGNOSIS — I639 Cerebral infarction, unspecified: Secondary | ICD-10-CM | POA: Diagnosis not present

## 2018-02-10 DIAGNOSIS — R1319 Other dysphagia: Secondary | ICD-10-CM | POA: Diagnosis not present

## 2018-02-11 DIAGNOSIS — R1312 Dysphagia, oropharyngeal phase: Secondary | ICD-10-CM | POA: Diagnosis not present

## 2018-02-11 DIAGNOSIS — R279 Unspecified lack of coordination: Secondary | ICD-10-CM | POA: Diagnosis not present

## 2018-02-11 DIAGNOSIS — R278 Other lack of coordination: Secondary | ICD-10-CM | POA: Diagnosis not present

## 2018-02-11 DIAGNOSIS — R41841 Cognitive communication deficit: Secondary | ICD-10-CM | POA: Diagnosis not present

## 2018-02-13 DIAGNOSIS — R279 Unspecified lack of coordination: Secondary | ICD-10-CM | POA: Diagnosis not present

## 2018-02-13 DIAGNOSIS — R41841 Cognitive communication deficit: Secondary | ICD-10-CM | POA: Diagnosis not present

## 2018-02-13 DIAGNOSIS — R278 Other lack of coordination: Secondary | ICD-10-CM | POA: Diagnosis not present

## 2018-02-13 DIAGNOSIS — R1312 Dysphagia, oropharyngeal phase: Secondary | ICD-10-CM | POA: Diagnosis not present

## 2018-02-14 DIAGNOSIS — R1312 Dysphagia, oropharyngeal phase: Secondary | ICD-10-CM | POA: Diagnosis not present

## 2018-02-14 DIAGNOSIS — R279 Unspecified lack of coordination: Secondary | ICD-10-CM | POA: Diagnosis not present

## 2018-02-14 DIAGNOSIS — R41841 Cognitive communication deficit: Secondary | ICD-10-CM | POA: Diagnosis not present

## 2018-02-14 DIAGNOSIS — R278 Other lack of coordination: Secondary | ICD-10-CM | POA: Diagnosis not present

## 2018-02-15 DIAGNOSIS — R279 Unspecified lack of coordination: Secondary | ICD-10-CM | POA: Diagnosis not present

## 2018-02-15 DIAGNOSIS — R278 Other lack of coordination: Secondary | ICD-10-CM | POA: Diagnosis not present

## 2018-02-15 DIAGNOSIS — R41841 Cognitive communication deficit: Secondary | ICD-10-CM | POA: Diagnosis not present

## 2018-02-15 DIAGNOSIS — R1312 Dysphagia, oropharyngeal phase: Secondary | ICD-10-CM | POA: Diagnosis not present

## 2018-02-16 DIAGNOSIS — R279 Unspecified lack of coordination: Secondary | ICD-10-CM | POA: Diagnosis not present

## 2018-02-16 DIAGNOSIS — R1312 Dysphagia, oropharyngeal phase: Secondary | ICD-10-CM | POA: Diagnosis not present

## 2018-02-16 DIAGNOSIS — R41841 Cognitive communication deficit: Secondary | ICD-10-CM | POA: Diagnosis not present

## 2018-02-16 DIAGNOSIS — R278 Other lack of coordination: Secondary | ICD-10-CM | POA: Diagnosis not present

## 2018-02-17 ENCOUNTER — Ambulatory Visit: Payer: Self-pay | Admitting: Internal Medicine

## 2018-02-17 DIAGNOSIS — R1312 Dysphagia, oropharyngeal phase: Secondary | ICD-10-CM | POA: Diagnosis not present

## 2018-02-17 DIAGNOSIS — R279 Unspecified lack of coordination: Secondary | ICD-10-CM | POA: Diagnosis not present

## 2018-02-17 DIAGNOSIS — R278 Other lack of coordination: Secondary | ICD-10-CM | POA: Diagnosis not present

## 2018-02-17 DIAGNOSIS — R41841 Cognitive communication deficit: Secondary | ICD-10-CM | POA: Diagnosis not present

## 2018-02-20 ENCOUNTER — Ambulatory Visit (INDEPENDENT_AMBULATORY_CARE_PROVIDER_SITE_OTHER): Payer: Medicare Other | Admitting: Internal Medicine

## 2018-02-20 ENCOUNTER — Encounter: Payer: Self-pay | Admitting: Internal Medicine

## 2018-02-20 VITALS — BP 110/58 | HR 71 | Ht 67.0 in | Wt 111.0 lb

## 2018-02-20 DIAGNOSIS — R1312 Dysphagia, oropharyngeal phase: Secondary | ICD-10-CM | POA: Diagnosis not present

## 2018-02-20 DIAGNOSIS — I10 Essential (primary) hypertension: Secondary | ICD-10-CM

## 2018-02-20 DIAGNOSIS — R279 Unspecified lack of coordination: Secondary | ICD-10-CM | POA: Diagnosis not present

## 2018-02-20 DIAGNOSIS — I4891 Unspecified atrial fibrillation: Secondary | ICD-10-CM | POA: Diagnosis not present

## 2018-02-20 DIAGNOSIS — I63312 Cerebral infarction due to thrombosis of left middle cerebral artery: Secondary | ICD-10-CM

## 2018-02-20 DIAGNOSIS — R41841 Cognitive communication deficit: Secondary | ICD-10-CM | POA: Diagnosis not present

## 2018-02-20 DIAGNOSIS — R278 Other lack of coordination: Secondary | ICD-10-CM | POA: Diagnosis not present

## 2018-02-20 LAB — BASIC METABOLIC PANEL
BUN / CREAT RATIO: 23 (ref 12–28)
BUN: 18 mg/dL (ref 8–27)
CHLORIDE: 101 mmol/L (ref 96–106)
CO2: 24 mmol/L (ref 20–29)
CREATININE: 0.79 mg/dL (ref 0.57–1.00)
Calcium: 8.8 mg/dL (ref 8.7–10.3)
GFR calc Af Amer: 79 mL/min/{1.73_m2} (ref >59)
GFR calc non Af Amer: 69 mL/min/{1.73_m2} (ref >59)
GLUCOSE: 72 mg/dL (ref 65–99)
Potassium: 5.1 mmol/L (ref 3.5–5.2)
Sodium: 140 mmol/L (ref 134–144)

## 2018-02-20 LAB — CBC
Hematocrit: 41.8 % (ref 34.0–46.6)
Hemoglobin: 13.5 g/dL (ref 11.1–15.9)
MCH: 28.1 pg (ref 26.6–33.0)
MCHC: 32.3 g/dL (ref 31.5–35.7)
MCV: 87 fL (ref 79–97)
Platelets: 270 10*3/uL (ref 150–379)
RBC: 4.8 x10E6/uL (ref 3.77–5.28)
RDW: 15.3 % (ref 12.3–15.4)
WBC: 7.7 10*3/uL (ref 3.4–10.8)

## 2018-02-20 NOTE — Patient Instructions (Signed)
Your physician recommends that you continue on your current medications as directed. Please refer to the Current Medication list given to you today. Your physician recommends that you return for lab work today (BMET, CBC) Your physician wants you to follow-up in: [place]. You will receive a reminder letter in the mail two months in advance. If you don't receive a letter, please call our office to schedule the follow-up appointment.

## 2018-02-20 NOTE — Progress Notes (Signed)
Cardiology Office Note   Date:  02/28/2018   ID:  Kayla Kent, DOB 1931/11/23, MRN [id number]  PCP:  Biagio Borg, MD  Cardiologist:   Dorris Carnes, MD    F/U of atrial fib and HTN     History of Present Illness: Kayla Kent is a 84 y.o. female with a history of atrial fib and HTN I saw her back in October 2018 Stress myovue in June 2018 neg for ischemia She was admitted later in the month with sepsis felt due to &TI  Admitted late in June with CP  She was in afib at time   Stress myovue in July 2018 negative for ischemia    Since I saw her she was admitted in Dec with GI bleeding and pneumonia.  Eliquis held   It wa resumed   On 11/24 she develped aphasia  Found to have large MCA CVA   Underwnt thrombolysis of L MCA   Hosp complicated by pneumonia   She was transferred to rehab service   Enventally discharged from hosp to SNF   She currently denies palpitations.  Breathing is OK   No dizziness  No CP  Slowlly improving   Current Meds  Medication Sig  . acetaminophen (TYLENOL) 160 MG/5ML solution Place 15.6 mLs (500 mg total) into feeding tube every 4 (four) hours as needed for mild pain or headache.  Marland Kitchen alum & mag hydroxide-simeth (MAALOX/MYLANTA) 200-200-20 MG/5ML suspension Place 30 mLs into feeding tube every 4 (four) hours as needed for indigestion or heartburn.  Marland Kitchen apixaban (ELIQUIS) 2.5 MG TABS tablet Place 1 tablet (2.5 mg total) into feeding tube 2 (two) times daily.  Marland Kitchen aspirin 81 MG chewable tablet Place 1 tablet (81 mg total) into feeding tube daily.  Marland Kitchen atorvastatin (LIPITOR) 20 MG tablet Place 1 tablet (20 mg total) into feeding tube daily at 6 PM.  . bethanechol (URECHOLINE) 10 MG tablet Take 1 tablet (10 mg total) by mouth 3 (three) times daily.  . bisacodyl (DULCOLAX) 10 MG suppository Place 1 suppository (10 mg total) rectally daily as needed for moderate constipation.  . chlorhexidine gluconate, MEDLINE KIT, (PERIDEX) 0.12 % solution 15 mLs by Mouth Rinse route 2  (two) times daily.  . collagenase (SANTYL) ointment Apply topically daily. To sacral wound, cover with moist 2 X 2 and foam dressing. Change daily and more frequently if soiled  . escitalopram (LEXAPRO) 5 MG tablet Take 1 tablet (5 mg total) by mouth daily.  . Hydrocortisone (GERHARDT'S BUTT CREAM) CREA Apply 1 application topically 4 (four) times daily.  Marland Kitchen ipratropium-albuterol (DUONEB) 0.5-2.5 (3) MG/3ML SOLN Take 3 mLs by nebulization every 4 (four) hours as needed.  . loratadine (CLARITIN) 10 MG tablet Take 10 mg by mouth daily.  . Melatonin 3 MG TABS Take 0.5 tablets (1.5 mg total) by mouth at bedtime.  . metoprolol tartrate (LOPRESSOR) 25 mg/10 mL SUSP Place 10 mLs (25 mg total) into feeding tube 2 (two) times daily.  Marland Kitchen mouth rinse LIQD solution 15 mLs by Mouth Rinse route 2 times daily at 12 noon and 4 pm.  . Nutritional Supplements (FEEDING SUPPLEMENT, OSMOLITE 1.5 CAL,) LIQD Place 220 mLs into feeding tube 5 (five) times daily.  . ondansetron (ZOFRAN) 4 MG tablet Take 1 tablet (4 mg total) by mouth every 6 (six) hours as needed for nausea or vomiting.  . polyethylene glycol (MIRALAX / GLYCOLAX) packet Place 17 g into feeding tube 2 (two) times daily with breakfast and  lunch.  . ranitidine (ZANTAC) 150 MG tablet Take 150 mg by mouth daily.  . simethicone (MYLICON) 40 PJ/8.2NK drops Place 0.6 mLs (40 mg total) into feeding tube 4 (four) times daily as needed for flatulence.  . sodium chloride (OCEAN) 0.65 % SOLN nasal spray Place 1 spray into both nostrils as needed for congestion.  . sodium phosphate (FLEET) 7-19 GM/118ML ENEM Place 133 mLs (1 enema total) rectally daily as needed for severe constipation.  Marland Kitchen trimethoprim-polymyxin b (POLYTRIM) ophthalmic solution Place 1 drop into the right eye 4 (four) times daily.  . Water For Irrigation, Sterile (FREE WATER) SOLN Place 200 mLs into feeding tube every 8 (eight) hours.     Allergies:   Chocolate; Codeine; Diazepam; Fruit & vegetable  daily [nutritional supplements]; Iohexol; Latex; Peach [prunus persica]; Peanut-containing drug products; Penicillins; Strawberry extract; Sulfonamide derivatives; Wheat; Wheat bran; Sulfamethoxazole; Crestor [rosuvastatin calcium]; and Iodine   Past Medical History:  Diagnosis Date  . Allergic rhinitis   . Anxiety   . CKD (chronic kidney disease) stage 3, GFR 30-59 ml/min (HCC)   . Emphysema of lung ([place])   . HLD (hyperlipidemia)   . HTN (hypertension)   . Intolerance of drug    orthostatic  . Paroxysmal supraventricular tachycardia ([place])   . PVD (peripheral vascular disease) ([place])   . Seizure ([place])   . Syncope and collapse   . Uterine prolapse without mention of vaginal wall prolapse     Past Surgical History:  Procedure Laterality Date  . CARDIOVERSION N/A 11/05/2014   Procedure: CARDIOVERSION;  Surgeon: Candee Furbish, MD;  Location: [facility] ENDOSCOPY;  Service: Cardiovascular;  Laterality: N/A;  . cataract surgery  08/2015  . CHOLECYSTECTOMY    . corrective eye surgery     as a child  . ESOPHAGOGASTRODUODENOSCOPY N/A 09/09/2017   Procedure: ESOPHAGOGASTRODUODENOSCOPY (EGD);  Surgeon: Mauri Pole, MD;  Location[facility] ENDOSCOPY;  Service: Endoscopy;  Laterality: N/A;  . INTRAMEDULLARY (IM) NAIL INTERTROCHANTERIC Left 11/21/2016   Procedure: INTRAMEDULLARY (IM) NAIL INTERTROCHANTRIC HEMI;  Surgeon: Newt Minion, MD;  Location: [facility];  Service: Orthopedics;  Laterality: Left;  . IR GASTROSTOMY TUBE MOD SED  09/26/2017  . IR PERCUTANEOUS ART THROMBECTOMY/INFUSION INTRACRANIAL INC DIAG ANGIO  09/11/2017  . IVD removed    . OPEN REDUCTION INTERNAL FIXATION (ORIF) DISTAL RADIAL FRACTURE Right 08/29/2014   Procedure: OPEN REDUCTION INTERNAL FIXATION (ORIF) DISTAL RADIAL FRACTURE;  Surgeon: Marianna Payment, MD;  Location: [facility];  Service: Orthopedics;  Laterality: Right;  . RADIOLOGY WITH ANESTHESIA N/A 09/11/2017   Procedure: RADIOLOGY WITH ANESTHESIA;  Surgeon: Luanne Bras,  MD;  Location: [facility];  Service: Radiology;  Laterality: N/A;  . RF ablation PSVT     summer '10  . stress cardiolite  08/05/93  . TONSILLECTOMY    . TOTAL HIP ARTHROPLASTY Right 08/29/2014   Procedure: Right Hip Hemi Arthroplasty;  Surgeon: Marianna Payment, MD;  Location: [facility];  Service: Orthopedics;  Laterality: Right;  Hip procedure 1st wants Peg Board, [employer], [place].      Social History:  The patient  reports that she quit smoking about 24 years ago. She started smoking about 66 years ago. She has a 9.50 pack-year smoking history. She has never used smokeless tobacco. She reports that she does not drink alcohol or use drugs.   Family History:  The patient's family history includes Arthritis in her father; Heart disease in her brother; Hyperlipidemia in her sister; Hypertension in her brother,  sister, and unknown relative; Mental illness in her father.    ROS:  Please see the history of present illness. All other systems are reviewed and  Negative to the above problem except as noted.    PHYSICAL EXAM: VS:  BP (!) 110/58   Pulse 71   Ht 5' 7" (1.702 m)   Wt 111 lb (50.3 kg)   SpO2 97%   BMI 17.39 kg/m   GEN: Thin 85  Y in no acute distress  HEENT: normal  Neck: JVP is not elevated   No , carotid bruits, or masses Cardiac: Regular rhythm no murmurs, rubs, or gallops,no edema  Respiratory:  Clear to auscultation bilaterally, normal work of breathing GI: soft, nontender, nondistended, + BS  No hepatomegaly  MS: no deformity Moving all extremities   Skin: warm and dry, no rash Neuro:  Strength and sensation are intact Psych: euthymic mood, full affect   EKG:  EKG is not ordered today.   Lipid Panel    Component Value Date/Time   CHOL 132 10/30/2015 0931   TRIG 99 09/14/2017 2315   TRIG 141 10/25/2006 0906   HDL 33 (L) 10/30/2015 0931   CHOLHDL 4.0 10/30/2015 0931   VLDL 14 10/30/2015 0931   LDLCALC 85 10/30/2015 0931   LDLDIRECT 175.3 01/19/2008 1216        Wt Readings from Last 3 Encounters:  02/20/18 111 lb (50.3 kg)  10/24/17 99 lb 7.6 oz (45.1 kg)  09/28/17 102 lb 4.7 oz (46.4 kg)      ASSESSMENT AND PLAN:  1  Persistent atrial fib  Keep on current regmen esp NOAC    2  HTN  Well controlled   3  CVA   Undergoing rehab  Will check CBC and BMET   F?U in 6 months    Current medicines are reviewed at length with the patient today.  The patient does not have concerns regarding medicines.  Signed, Dorris Carnes, MD  02/28/2018 2:08 AM    [facility] [address], [place], [place]  [address] Phone: [phone]; Fax: [phone]

## 2018-02-21 DIAGNOSIS — R41841 Cognitive communication deficit: Secondary | ICD-10-CM | POA: Diagnosis not present

## 2018-02-21 DIAGNOSIS — R278 Other lack of coordination: Secondary | ICD-10-CM | POA: Diagnosis not present

## 2018-02-21 DIAGNOSIS — R279 Unspecified lack of coordination: Secondary | ICD-10-CM | POA: Diagnosis not present

## 2018-02-21 DIAGNOSIS — R1312 Dysphagia, oropharyngeal phase: Secondary | ICD-10-CM | POA: Diagnosis not present

## 2018-02-22 DIAGNOSIS — R278 Other lack of coordination: Secondary | ICD-10-CM | POA: Diagnosis not present

## 2018-02-22 DIAGNOSIS — R41841 Cognitive communication deficit: Secondary | ICD-10-CM | POA: Diagnosis not present

## 2018-02-22 DIAGNOSIS — R1312 Dysphagia, oropharyngeal phase: Secondary | ICD-10-CM | POA: Diagnosis not present

## 2018-02-22 DIAGNOSIS — R279 Unspecified lack of coordination: Secondary | ICD-10-CM | POA: Diagnosis not present

## 2018-02-23 ENCOUNTER — Telehealth: Payer: Self-pay | Admitting: Internal Medicine

## 2018-02-23 DIAGNOSIS — R279 Unspecified lack of coordination: Secondary | ICD-10-CM | POA: Diagnosis not present

## 2018-02-23 DIAGNOSIS — R1312 Dysphagia, oropharyngeal phase: Secondary | ICD-10-CM | POA: Diagnosis not present

## 2018-02-23 DIAGNOSIS — R41841 Cognitive communication deficit: Secondary | ICD-10-CM | POA: Diagnosis not present

## 2018-02-23 DIAGNOSIS — R278 Other lack of coordination: Secondary | ICD-10-CM | POA: Diagnosis not present

## 2018-02-23 NOTE — Telephone Encounter (Signed)
Spoke with patient's daughter, [name]. Informed of normal lab results.

## 2018-02-23 NOTE — Telephone Encounter (Signed)
New message    Patient's daughter calling for lab results

## 2018-02-24 DIAGNOSIS — R278 Other lack of coordination: Secondary | ICD-10-CM | POA: Diagnosis not present

## 2018-02-24 DIAGNOSIS — R279 Unspecified lack of coordination: Secondary | ICD-10-CM | POA: Diagnosis not present

## 2018-02-24 DIAGNOSIS — R41841 Cognitive communication deficit: Secondary | ICD-10-CM | POA: Diagnosis not present

## 2018-02-24 DIAGNOSIS — R1312 Dysphagia, oropharyngeal phase: Secondary | ICD-10-CM | POA: Diagnosis not present

## 2018-02-27 ENCOUNTER — Ambulatory Visit (HOSPITAL_COMMUNITY)
Admission: RE | Admit: 2018-02-27 | Discharge: 2018-02-27 | Disposition: A | Discharge status: Home / Self Care | Payer: Medicare Other | Source: Ambulatory Visit | Attending: Radiation Oncology | Admitting: Radiation Oncology

## 2018-02-27 ENCOUNTER — Encounter (HOSPITAL_COMMUNITY): Payer: Self-pay

## 2018-02-27 DIAGNOSIS — J432 Centrilobular emphysema: Secondary | ICD-10-CM | POA: Insufficient documentation

## 2018-02-27 DIAGNOSIS — J479 Bronchiectasis, uncomplicated: Secondary | ICD-10-CM | POA: Diagnosis not present

## 2018-02-27 DIAGNOSIS — R278 Other lack of coordination: Secondary | ICD-10-CM | POA: Diagnosis not present

## 2018-02-27 DIAGNOSIS — C3411 Malignant neoplasm of upper lobe, right bronchus or lung: Secondary | ICD-10-CM

## 2018-02-27 DIAGNOSIS — R279 Unspecified lack of coordination: Secondary | ICD-10-CM | POA: Diagnosis not present

## 2018-02-27 DIAGNOSIS — C3491 Malignant neoplasm of unspecified part of right bronchus or lung: Secondary | ICD-10-CM | POA: Diagnosis not present

## 2018-02-27 DIAGNOSIS — I2510 Atherosclerotic heart disease of native coronary artery without angina pectoris: Secondary | ICD-10-CM | POA: Diagnosis not present

## 2018-02-27 DIAGNOSIS — J90 Pleural effusion, not elsewhere classified: Secondary | ICD-10-CM | POA: Diagnosis not present

## 2018-02-27 DIAGNOSIS — R1312 Dysphagia, oropharyngeal phase: Secondary | ICD-10-CM | POA: Diagnosis not present

## 2018-02-27 DIAGNOSIS — I700 Atherosclerosis of aorta: Secondary | ICD-10-CM | POA: Insufficient documentation

## 2018-02-27 DIAGNOSIS — R41841 Cognitive communication deficit: Secondary | ICD-10-CM | POA: Diagnosis not present

## 2018-02-28 ENCOUNTER — Telehealth: Payer: Self-pay | Admitting: Radiation Oncology

## 2018-02-28 DIAGNOSIS — C3411 Malignant neoplasm of upper lobe, right bronchus or lung: Secondary | ICD-10-CM

## 2018-02-28 DIAGNOSIS — R1312 Dysphagia, oropharyngeal phase: Secondary | ICD-10-CM | POA: Diagnosis not present

## 2018-02-28 DIAGNOSIS — R278 Other lack of coordination: Secondary | ICD-10-CM | POA: Diagnosis not present

## 2018-02-28 DIAGNOSIS — R279 Unspecified lack of coordination: Secondary | ICD-10-CM | POA: Diagnosis not present

## 2018-02-28 DIAGNOSIS — R41841 Cognitive communication deficit: Secondary | ICD-10-CM | POA: Diagnosis not present

## 2018-02-28 NOTE — Telephone Encounter (Signed)
I spoke with the patient's daughter to review CT imaging. We will repeat in 3 months time.

## 2018-03-01 DIAGNOSIS — R41841 Cognitive communication deficit: Secondary | ICD-10-CM | POA: Diagnosis not present

## 2018-03-01 DIAGNOSIS — R279 Unspecified lack of coordination: Secondary | ICD-10-CM | POA: Diagnosis not present

## 2018-03-01 DIAGNOSIS — R278 Other lack of coordination: Secondary | ICD-10-CM | POA: Diagnosis not present

## 2018-03-01 DIAGNOSIS — R1312 Dysphagia, oropharyngeal phase: Secondary | ICD-10-CM | POA: Diagnosis not present

## 2018-03-02 DIAGNOSIS — R1312 Dysphagia, oropharyngeal phase: Secondary | ICD-10-CM | POA: Diagnosis not present

## 2018-03-02 DIAGNOSIS — R41841 Cognitive communication deficit: Secondary | ICD-10-CM | POA: Diagnosis not present

## 2018-03-02 DIAGNOSIS — R278 Other lack of coordination: Secondary | ICD-10-CM | POA: Diagnosis not present

## 2018-03-02 DIAGNOSIS — R279 Unspecified lack of coordination: Secondary | ICD-10-CM | POA: Diagnosis not present

## 2018-03-03 DIAGNOSIS — R1312 Dysphagia, oropharyngeal phase: Secondary | ICD-10-CM | POA: Diagnosis not present

## 2018-03-03 DIAGNOSIS — R278 Other lack of coordination: Secondary | ICD-10-CM | POA: Diagnosis not present

## 2018-03-03 DIAGNOSIS — R279 Unspecified lack of coordination: Secondary | ICD-10-CM | POA: Diagnosis not present

## 2018-03-03 DIAGNOSIS — R41841 Cognitive communication deficit: Secondary | ICD-10-CM | POA: Diagnosis not present

## 2018-03-06 DIAGNOSIS — R1312 Dysphagia, oropharyngeal phase: Secondary | ICD-10-CM | POA: Diagnosis not present

## 2018-03-06 DIAGNOSIS — R41841 Cognitive communication deficit: Secondary | ICD-10-CM | POA: Diagnosis not present

## 2018-03-06 DIAGNOSIS — R278 Other lack of coordination: Secondary | ICD-10-CM | POA: Diagnosis not present

## 2018-03-06 DIAGNOSIS — R279 Unspecified lack of coordination: Secondary | ICD-10-CM | POA: Diagnosis not present

## 2018-03-07 DIAGNOSIS — R41841 Cognitive communication deficit: Secondary | ICD-10-CM | POA: Diagnosis not present

## 2018-03-07 DIAGNOSIS — R279 Unspecified lack of coordination: Secondary | ICD-10-CM | POA: Diagnosis not present

## 2018-03-07 DIAGNOSIS — R1312 Dysphagia, oropharyngeal phase: Secondary | ICD-10-CM | POA: Diagnosis not present

## 2018-03-07 DIAGNOSIS — R278 Other lack of coordination: Secondary | ICD-10-CM | POA: Diagnosis not present

## 2018-03-08 DIAGNOSIS — R41841 Cognitive communication deficit: Secondary | ICD-10-CM | POA: Diagnosis not present

## 2018-03-08 DIAGNOSIS — R1312 Dysphagia, oropharyngeal phase: Secondary | ICD-10-CM | POA: Diagnosis not present

## 2018-03-08 DIAGNOSIS — R278 Other lack of coordination: Secondary | ICD-10-CM | POA: Diagnosis not present

## 2018-03-08 DIAGNOSIS — R279 Unspecified lack of coordination: Secondary | ICD-10-CM | POA: Diagnosis not present

## 2018-03-09 DIAGNOSIS — R1312 Dysphagia, oropharyngeal phase: Secondary | ICD-10-CM | POA: Diagnosis not present

## 2018-03-09 DIAGNOSIS — R279 Unspecified lack of coordination: Secondary | ICD-10-CM | POA: Diagnosis not present

## 2018-03-09 DIAGNOSIS — R41841 Cognitive communication deficit: Secondary | ICD-10-CM | POA: Diagnosis not present

## 2018-03-09 DIAGNOSIS — R278 Other lack of coordination: Secondary | ICD-10-CM | POA: Diagnosis not present

## 2018-03-10 DIAGNOSIS — R1312 Dysphagia, oropharyngeal phase: Secondary | ICD-10-CM | POA: Diagnosis not present

## 2018-03-10 DIAGNOSIS — R279 Unspecified lack of coordination: Secondary | ICD-10-CM | POA: Diagnosis not present

## 2018-03-10 DIAGNOSIS — R41841 Cognitive communication deficit: Secondary | ICD-10-CM | POA: Diagnosis not present

## 2018-03-10 DIAGNOSIS — R278 Other lack of coordination: Secondary | ICD-10-CM | POA: Diagnosis not present

## 2018-03-13 DIAGNOSIS — R278 Other lack of coordination: Secondary | ICD-10-CM | POA: Diagnosis not present

## 2018-03-13 DIAGNOSIS — R1312 Dysphagia, oropharyngeal phase: Secondary | ICD-10-CM | POA: Diagnosis not present

## 2018-03-13 DIAGNOSIS — R279 Unspecified lack of coordination: Secondary | ICD-10-CM | POA: Diagnosis not present

## 2018-03-13 DIAGNOSIS — R41841 Cognitive communication deficit: Secondary | ICD-10-CM | POA: Diagnosis not present

## 2018-03-14 DIAGNOSIS — R338 Other retention of urine: Secondary | ICD-10-CM | POA: Diagnosis not present

## 2018-03-14 DIAGNOSIS — R1312 Dysphagia, oropharyngeal phase: Secondary | ICD-10-CM | POA: Diagnosis not present

## 2018-03-14 DIAGNOSIS — R41841 Cognitive communication deficit: Secondary | ICD-10-CM | POA: Diagnosis not present

## 2018-03-14 DIAGNOSIS — R1319 Other dysphagia: Secondary | ICD-10-CM | POA: Diagnosis not present

## 2018-03-14 DIAGNOSIS — R279 Unspecified lack of coordination: Secondary | ICD-10-CM | POA: Diagnosis not present

## 2018-03-14 DIAGNOSIS — J449 Chronic obstructive pulmonary disease, unspecified: Secondary | ICD-10-CM | POA: Diagnosis not present

## 2018-03-14 DIAGNOSIS — I639 Cerebral infarction, unspecified: Secondary | ICD-10-CM | POA: Diagnosis not present

## 2018-03-14 DIAGNOSIS — I4891 Unspecified atrial fibrillation: Secondary | ICD-10-CM | POA: Diagnosis not present

## 2018-03-14 DIAGNOSIS — K5900 Constipation, unspecified: Secondary | ICD-10-CM | POA: Diagnosis not present

## 2018-03-14 DIAGNOSIS — M6281 Muscle weakness (generalized): Secondary | ICD-10-CM | POA: Diagnosis not present

## 2018-03-14 DIAGNOSIS — I10 Essential (primary) hypertension: Secondary | ICD-10-CM | POA: Diagnosis not present

## 2018-03-14 DIAGNOSIS — E785 Hyperlipidemia, unspecified: Secondary | ICD-10-CM | POA: Diagnosis not present

## 2018-03-14 DIAGNOSIS — K219 Gastro-esophageal reflux disease without esophagitis: Secondary | ICD-10-CM | POA: Diagnosis not present

## 2018-03-14 DIAGNOSIS — R278 Other lack of coordination: Secondary | ICD-10-CM | POA: Diagnosis not present

## 2018-03-14 DIAGNOSIS — I69354 Hemiplegia and hemiparesis following cerebral infarction affecting left non-dominant side: Secondary | ICD-10-CM | POA: Diagnosis not present

## 2018-03-14 DIAGNOSIS — Z85118 Personal history of other malignant neoplasm of bronchus and lung: Secondary | ICD-10-CM | POA: Diagnosis not present

## 2018-03-15 DIAGNOSIS — R1312 Dysphagia, oropharyngeal phase: Secondary | ICD-10-CM | POA: Diagnosis not present

## 2018-03-15 DIAGNOSIS — R279 Unspecified lack of coordination: Secondary | ICD-10-CM | POA: Diagnosis not present

## 2018-03-15 DIAGNOSIS — G4700 Insomnia, unspecified: Secondary | ICD-10-CM | POA: Diagnosis not present

## 2018-03-15 DIAGNOSIS — R278 Other lack of coordination: Secondary | ICD-10-CM | POA: Diagnosis not present

## 2018-03-15 DIAGNOSIS — R41841 Cognitive communication deficit: Secondary | ICD-10-CM | POA: Diagnosis not present

## 2018-03-15 DIAGNOSIS — F331 Major depressive disorder, recurrent, moderate: Secondary | ICD-10-CM | POA: Diagnosis not present

## 2018-03-15 DIAGNOSIS — F419 Anxiety disorder, unspecified: Secondary | ICD-10-CM | POA: Diagnosis not present

## 2018-03-16 DIAGNOSIS — R1312 Dysphagia, oropharyngeal phase: Secondary | ICD-10-CM | POA: Diagnosis not present

## 2018-03-16 DIAGNOSIS — R279 Unspecified lack of coordination: Secondary | ICD-10-CM | POA: Diagnosis not present

## 2018-03-16 DIAGNOSIS — R41841 Cognitive communication deficit: Secondary | ICD-10-CM | POA: Diagnosis not present

## 2018-03-16 DIAGNOSIS — H1031 Unspecified acute conjunctivitis, right eye: Secondary | ICD-10-CM | POA: Diagnosis not present

## 2018-03-16 DIAGNOSIS — R278 Other lack of coordination: Secondary | ICD-10-CM | POA: Diagnosis not present

## 2018-03-17 DIAGNOSIS — R279 Unspecified lack of coordination: Secondary | ICD-10-CM | POA: Diagnosis not present

## 2018-03-17 DIAGNOSIS — R278 Other lack of coordination: Secondary | ICD-10-CM | POA: Diagnosis not present

## 2018-03-17 DIAGNOSIS — R41841 Cognitive communication deficit: Secondary | ICD-10-CM | POA: Diagnosis not present

## 2018-03-17 DIAGNOSIS — R1312 Dysphagia, oropharyngeal phase: Secondary | ICD-10-CM | POA: Diagnosis not present

## 2018-03-20 DIAGNOSIS — I63312 Cerebral infarction due to thrombosis of left middle cerebral artery: Secondary | ICD-10-CM | POA: Diagnosis not present

## 2018-03-20 DIAGNOSIS — K219 Gastro-esophageal reflux disease without esophagitis: Secondary | ICD-10-CM | POA: Diagnosis not present

## 2018-03-20 DIAGNOSIS — R41841 Cognitive communication deficit: Secondary | ICD-10-CM | POA: Diagnosis not present

## 2018-03-20 DIAGNOSIS — R1312 Dysphagia, oropharyngeal phase: Secondary | ICD-10-CM | POA: Diagnosis not present

## 2018-03-20 DIAGNOSIS — R278 Other lack of coordination: Secondary | ICD-10-CM | POA: Diagnosis not present

## 2018-03-20 DIAGNOSIS — R279 Unspecified lack of coordination: Secondary | ICD-10-CM | POA: Diagnosis not present

## 2018-03-21 DIAGNOSIS — K219 Gastro-esophageal reflux disease without esophagitis: Secondary | ICD-10-CM | POA: Diagnosis not present

## 2018-03-21 DIAGNOSIS — R279 Unspecified lack of coordination: Secondary | ICD-10-CM | POA: Diagnosis not present

## 2018-03-21 DIAGNOSIS — R1312 Dysphagia, oropharyngeal phase: Secondary | ICD-10-CM | POA: Diagnosis not present

## 2018-03-21 DIAGNOSIS — R41841 Cognitive communication deficit: Secondary | ICD-10-CM | POA: Diagnosis not present

## 2018-03-21 DIAGNOSIS — R278 Other lack of coordination: Secondary | ICD-10-CM | POA: Diagnosis not present

## 2018-03-21 DIAGNOSIS — I63312 Cerebral infarction due to thrombosis of left middle cerebral artery: Secondary | ICD-10-CM | POA: Diagnosis not present

## 2018-03-22 DIAGNOSIS — R1312 Dysphagia, oropharyngeal phase: Secondary | ICD-10-CM | POA: Diagnosis not present

## 2018-03-22 DIAGNOSIS — R279 Unspecified lack of coordination: Secondary | ICD-10-CM | POA: Diagnosis not present

## 2018-03-22 DIAGNOSIS — R1319 Other dysphagia: Secondary | ICD-10-CM | POA: Diagnosis not present

## 2018-03-22 DIAGNOSIS — K219 Gastro-esophageal reflux disease without esophagitis: Secondary | ICD-10-CM | POA: Diagnosis not present

## 2018-03-22 DIAGNOSIS — I6312 Cerebral infarction due to embolism of basilar artery: Secondary | ICD-10-CM | POA: Diagnosis not present

## 2018-03-22 DIAGNOSIS — I63312 Cerebral infarction due to thrombosis of left middle cerebral artery: Secondary | ICD-10-CM | POA: Diagnosis not present

## 2018-03-22 DIAGNOSIS — R41841 Cognitive communication deficit: Secondary | ICD-10-CM | POA: Diagnosis not present

## 2018-03-22 DIAGNOSIS — M6281 Muscle weakness (generalized): Secondary | ICD-10-CM | POA: Diagnosis not present

## 2018-03-22 DIAGNOSIS — R278 Other lack of coordination: Secondary | ICD-10-CM | POA: Diagnosis not present

## 2018-03-22 DIAGNOSIS — R05 Cough: Secondary | ICD-10-CM | POA: Diagnosis not present

## 2018-03-23 DIAGNOSIS — R279 Unspecified lack of coordination: Secondary | ICD-10-CM | POA: Diagnosis not present

## 2018-03-23 DIAGNOSIS — I63312 Cerebral infarction due to thrombosis of left middle cerebral artery: Secondary | ICD-10-CM | POA: Diagnosis not present

## 2018-03-23 DIAGNOSIS — K219 Gastro-esophageal reflux disease without esophagitis: Secondary | ICD-10-CM | POA: Diagnosis not present

## 2018-03-23 DIAGNOSIS — R1312 Dysphagia, oropharyngeal phase: Secondary | ICD-10-CM | POA: Diagnosis not present

## 2018-03-23 DIAGNOSIS — R41841 Cognitive communication deficit: Secondary | ICD-10-CM | POA: Diagnosis not present

## 2018-03-23 DIAGNOSIS — R278 Other lack of coordination: Secondary | ICD-10-CM | POA: Diagnosis not present

## 2018-03-24 DIAGNOSIS — R279 Unspecified lack of coordination: Secondary | ICD-10-CM | POA: Diagnosis not present

## 2018-03-24 DIAGNOSIS — K219 Gastro-esophageal reflux disease without esophagitis: Secondary | ICD-10-CM | POA: Diagnosis not present

## 2018-03-24 DIAGNOSIS — R278 Other lack of coordination: Secondary | ICD-10-CM | POA: Diagnosis not present

## 2018-03-24 DIAGNOSIS — R41841 Cognitive communication deficit: Secondary | ICD-10-CM | POA: Diagnosis not present

## 2018-03-24 DIAGNOSIS — I63312 Cerebral infarction due to thrombosis of left middle cerebral artery: Secondary | ICD-10-CM | POA: Diagnosis not present

## 2018-03-24 DIAGNOSIS — R1312 Dysphagia, oropharyngeal phase: Secondary | ICD-10-CM | POA: Diagnosis not present

## 2018-03-27 DIAGNOSIS — K219 Gastro-esophageal reflux disease without esophagitis: Secondary | ICD-10-CM | POA: Diagnosis not present

## 2018-03-27 DIAGNOSIS — R1312 Dysphagia, oropharyngeal phase: Secondary | ICD-10-CM | POA: Diagnosis not present

## 2018-03-27 DIAGNOSIS — R279 Unspecified lack of coordination: Secondary | ICD-10-CM | POA: Diagnosis not present

## 2018-03-27 DIAGNOSIS — R41841 Cognitive communication deficit: Secondary | ICD-10-CM | POA: Diagnosis not present

## 2018-03-27 DIAGNOSIS — I63312 Cerebral infarction due to thrombosis of left middle cerebral artery: Secondary | ICD-10-CM | POA: Diagnosis not present

## 2018-03-27 DIAGNOSIS — R278 Other lack of coordination: Secondary | ICD-10-CM | POA: Diagnosis not present

## 2018-03-28 DIAGNOSIS — R279 Unspecified lack of coordination: Secondary | ICD-10-CM | POA: Diagnosis not present

## 2018-03-28 DIAGNOSIS — I63312 Cerebral infarction due to thrombosis of left middle cerebral artery: Secondary | ICD-10-CM | POA: Diagnosis not present

## 2018-03-28 DIAGNOSIS — R41841 Cognitive communication deficit: Secondary | ICD-10-CM | POA: Diagnosis not present

## 2018-03-28 DIAGNOSIS — R1312 Dysphagia, oropharyngeal phase: Secondary | ICD-10-CM | POA: Diagnosis not present

## 2018-03-28 DIAGNOSIS — R278 Other lack of coordination: Secondary | ICD-10-CM | POA: Diagnosis not present

## 2018-03-28 DIAGNOSIS — K219 Gastro-esophageal reflux disease without esophagitis: Secondary | ICD-10-CM | POA: Diagnosis not present

## 2018-03-29 DIAGNOSIS — R1312 Dysphagia, oropharyngeal phase: Secondary | ICD-10-CM | POA: Diagnosis not present

## 2018-03-29 DIAGNOSIS — K219 Gastro-esophageal reflux disease without esophagitis: Secondary | ICD-10-CM | POA: Diagnosis not present

## 2018-03-29 DIAGNOSIS — R41841 Cognitive communication deficit: Secondary | ICD-10-CM | POA: Diagnosis not present

## 2018-03-29 DIAGNOSIS — R279 Unspecified lack of coordination: Secondary | ICD-10-CM | POA: Diagnosis not present

## 2018-03-29 DIAGNOSIS — R278 Other lack of coordination: Secondary | ICD-10-CM | POA: Diagnosis not present

## 2018-03-29 DIAGNOSIS — I63312 Cerebral infarction due to thrombosis of left middle cerebral artery: Secondary | ICD-10-CM | POA: Diagnosis not present

## 2018-03-30 DIAGNOSIS — R278 Other lack of coordination: Secondary | ICD-10-CM | POA: Diagnosis not present

## 2018-03-30 DIAGNOSIS — F331 Major depressive disorder, recurrent, moderate: Secondary | ICD-10-CM | POA: Diagnosis not present

## 2018-03-30 DIAGNOSIS — R1312 Dysphagia, oropharyngeal phase: Secondary | ICD-10-CM | POA: Diagnosis not present

## 2018-03-30 DIAGNOSIS — R41841 Cognitive communication deficit: Secondary | ICD-10-CM | POA: Diagnosis not present

## 2018-03-30 DIAGNOSIS — F419 Anxiety disorder, unspecified: Secondary | ICD-10-CM | POA: Diagnosis not present

## 2018-03-30 DIAGNOSIS — I63312 Cerebral infarction due to thrombosis of left middle cerebral artery: Secondary | ICD-10-CM | POA: Diagnosis not present

## 2018-03-30 DIAGNOSIS — R279 Unspecified lack of coordination: Secondary | ICD-10-CM | POA: Diagnosis not present

## 2018-03-30 DIAGNOSIS — K219 Gastro-esophageal reflux disease without esophagitis: Secondary | ICD-10-CM | POA: Diagnosis not present

## 2018-03-31 DIAGNOSIS — R278 Other lack of coordination: Secondary | ICD-10-CM | POA: Diagnosis not present

## 2018-03-31 DIAGNOSIS — R279 Unspecified lack of coordination: Secondary | ICD-10-CM | POA: Diagnosis not present

## 2018-03-31 DIAGNOSIS — R1312 Dysphagia, oropharyngeal phase: Secondary | ICD-10-CM | POA: Diagnosis not present

## 2018-03-31 DIAGNOSIS — R41841 Cognitive communication deficit: Secondary | ICD-10-CM | POA: Diagnosis not present

## 2018-03-31 DIAGNOSIS — K219 Gastro-esophageal reflux disease without esophagitis: Secondary | ICD-10-CM | POA: Diagnosis not present

## 2018-03-31 DIAGNOSIS — I63312 Cerebral infarction due to thrombosis of left middle cerebral artery: Secondary | ICD-10-CM | POA: Diagnosis not present

## 2018-04-03 DIAGNOSIS — R1312 Dysphagia, oropharyngeal phase: Secondary | ICD-10-CM | POA: Diagnosis not present

## 2018-04-03 DIAGNOSIS — I63312 Cerebral infarction due to thrombosis of left middle cerebral artery: Secondary | ICD-10-CM | POA: Diagnosis not present

## 2018-04-03 DIAGNOSIS — R279 Unspecified lack of coordination: Secondary | ICD-10-CM | POA: Diagnosis not present

## 2018-04-03 DIAGNOSIS — R278 Other lack of coordination: Secondary | ICD-10-CM | POA: Diagnosis not present

## 2018-04-03 DIAGNOSIS — R41841 Cognitive communication deficit: Secondary | ICD-10-CM | POA: Diagnosis not present

## 2018-04-03 DIAGNOSIS — K219 Gastro-esophageal reflux disease without esophagitis: Secondary | ICD-10-CM | POA: Diagnosis not present

## 2018-04-04 DIAGNOSIS — H10401 Unspecified chronic conjunctivitis, right eye: Secondary | ICD-10-CM | POA: Diagnosis not present

## 2018-04-04 DIAGNOSIS — R1312 Dysphagia, oropharyngeal phase: Secondary | ICD-10-CM | POA: Diagnosis not present

## 2018-04-04 DIAGNOSIS — I63312 Cerebral infarction due to thrombosis of left middle cerebral artery: Secondary | ICD-10-CM | POA: Diagnosis not present

## 2018-04-04 DIAGNOSIS — R278 Other lack of coordination: Secondary | ICD-10-CM | POA: Diagnosis not present

## 2018-04-04 DIAGNOSIS — K219 Gastro-esophageal reflux disease without esophagitis: Secondary | ICD-10-CM | POA: Diagnosis not present

## 2018-04-04 DIAGNOSIS — R279 Unspecified lack of coordination: Secondary | ICD-10-CM | POA: Diagnosis not present

## 2018-04-04 DIAGNOSIS — H02002 Unspecified entropion of right lower eyelid: Secondary | ICD-10-CM | POA: Diagnosis not present

## 2018-04-04 DIAGNOSIS — R41841 Cognitive communication deficit: Secondary | ICD-10-CM | POA: Diagnosis not present

## 2018-04-05 DIAGNOSIS — K219 Gastro-esophageal reflux disease without esophagitis: Secondary | ICD-10-CM | POA: Diagnosis not present

## 2018-04-05 DIAGNOSIS — R1312 Dysphagia, oropharyngeal phase: Secondary | ICD-10-CM | POA: Diagnosis not present

## 2018-04-05 DIAGNOSIS — I63312 Cerebral infarction due to thrombosis of left middle cerebral artery: Secondary | ICD-10-CM | POA: Diagnosis not present

## 2018-04-05 DIAGNOSIS — R279 Unspecified lack of coordination: Secondary | ICD-10-CM | POA: Diagnosis not present

## 2018-04-05 DIAGNOSIS — R41841 Cognitive communication deficit: Secondary | ICD-10-CM | POA: Diagnosis not present

## 2018-04-05 DIAGNOSIS — R278 Other lack of coordination: Secondary | ICD-10-CM | POA: Diagnosis not present

## 2018-04-06 DIAGNOSIS — R278 Other lack of coordination: Secondary | ICD-10-CM | POA: Diagnosis not present

## 2018-04-06 DIAGNOSIS — R41841 Cognitive communication deficit: Secondary | ICD-10-CM | POA: Diagnosis not present

## 2018-04-06 DIAGNOSIS — R279 Unspecified lack of coordination: Secondary | ICD-10-CM | POA: Diagnosis not present

## 2018-04-06 DIAGNOSIS — K219 Gastro-esophageal reflux disease without esophagitis: Secondary | ICD-10-CM | POA: Diagnosis not present

## 2018-04-06 DIAGNOSIS — R1312 Dysphagia, oropharyngeal phase: Secondary | ICD-10-CM | POA: Diagnosis not present

## 2018-04-06 DIAGNOSIS — I63312 Cerebral infarction due to thrombosis of left middle cerebral artery: Secondary | ICD-10-CM | POA: Diagnosis not present

## 2018-04-07 DIAGNOSIS — I63312 Cerebral infarction due to thrombosis of left middle cerebral artery: Secondary | ICD-10-CM | POA: Diagnosis not present

## 2018-04-07 DIAGNOSIS — K219 Gastro-esophageal reflux disease without esophagitis: Secondary | ICD-10-CM | POA: Diagnosis not present

## 2018-04-07 DIAGNOSIS — R279 Unspecified lack of coordination: Secondary | ICD-10-CM | POA: Diagnosis not present

## 2018-04-07 DIAGNOSIS — R1312 Dysphagia, oropharyngeal phase: Secondary | ICD-10-CM | POA: Diagnosis not present

## 2018-04-07 DIAGNOSIS — R41841 Cognitive communication deficit: Secondary | ICD-10-CM | POA: Diagnosis not present

## 2018-04-07 DIAGNOSIS — R278 Other lack of coordination: Secondary | ICD-10-CM | POA: Diagnosis not present

## 2018-04-08 DIAGNOSIS — R41841 Cognitive communication deficit: Secondary | ICD-10-CM | POA: Diagnosis not present

## 2018-04-08 DIAGNOSIS — R278 Other lack of coordination: Secondary | ICD-10-CM | POA: Diagnosis not present

## 2018-04-08 DIAGNOSIS — I63312 Cerebral infarction due to thrombosis of left middle cerebral artery: Secondary | ICD-10-CM | POA: Diagnosis not present

## 2018-04-08 DIAGNOSIS — R1312 Dysphagia, oropharyngeal phase: Secondary | ICD-10-CM | POA: Diagnosis not present

## 2018-04-08 DIAGNOSIS — R279 Unspecified lack of coordination: Secondary | ICD-10-CM | POA: Diagnosis not present

## 2018-04-08 DIAGNOSIS — K219 Gastro-esophageal reflux disease without esophagitis: Secondary | ICD-10-CM | POA: Diagnosis not present

## 2018-04-10 DIAGNOSIS — R41841 Cognitive communication deficit: Secondary | ICD-10-CM | POA: Diagnosis not present

## 2018-04-10 DIAGNOSIS — R279 Unspecified lack of coordination: Secondary | ICD-10-CM | POA: Diagnosis not present

## 2018-04-10 DIAGNOSIS — R1312 Dysphagia, oropharyngeal phase: Secondary | ICD-10-CM | POA: Diagnosis not present

## 2018-04-10 DIAGNOSIS — I63312 Cerebral infarction due to thrombosis of left middle cerebral artery: Secondary | ICD-10-CM | POA: Diagnosis not present

## 2018-04-10 DIAGNOSIS — K219 Gastro-esophageal reflux disease without esophagitis: Secondary | ICD-10-CM | POA: Diagnosis not present

## 2018-04-10 DIAGNOSIS — R278 Other lack of coordination: Secondary | ICD-10-CM | POA: Diagnosis not present

## 2018-04-11 DIAGNOSIS — R279 Unspecified lack of coordination: Secondary | ICD-10-CM | POA: Diagnosis not present

## 2018-04-11 DIAGNOSIS — R1312 Dysphagia, oropharyngeal phase: Secondary | ICD-10-CM | POA: Diagnosis not present

## 2018-04-11 DIAGNOSIS — I63312 Cerebral infarction due to thrombosis of left middle cerebral artery: Secondary | ICD-10-CM | POA: Diagnosis not present

## 2018-04-11 DIAGNOSIS — R278 Other lack of coordination: Secondary | ICD-10-CM | POA: Diagnosis not present

## 2018-04-11 DIAGNOSIS — R41841 Cognitive communication deficit: Secondary | ICD-10-CM | POA: Diagnosis not present

## 2018-04-11 DIAGNOSIS — K219 Gastro-esophageal reflux disease without esophagitis: Secondary | ICD-10-CM | POA: Diagnosis not present

## 2018-04-12 DIAGNOSIS — G4700 Insomnia, unspecified: Secondary | ICD-10-CM | POA: Diagnosis not present

## 2018-04-12 DIAGNOSIS — R1312 Dysphagia, oropharyngeal phase: Secondary | ICD-10-CM | POA: Diagnosis not present

## 2018-04-12 DIAGNOSIS — F419 Anxiety disorder, unspecified: Secondary | ICD-10-CM | POA: Diagnosis not present

## 2018-04-12 DIAGNOSIS — R41841 Cognitive communication deficit: Secondary | ICD-10-CM | POA: Diagnosis not present

## 2018-04-12 DIAGNOSIS — R279 Unspecified lack of coordination: Secondary | ICD-10-CM | POA: Diagnosis not present

## 2018-04-12 DIAGNOSIS — I63312 Cerebral infarction due to thrombosis of left middle cerebral artery: Secondary | ICD-10-CM | POA: Diagnosis not present

## 2018-04-12 DIAGNOSIS — R278 Other lack of coordination: Secondary | ICD-10-CM | POA: Diagnosis not present

## 2018-04-12 DIAGNOSIS — F331 Major depressive disorder, recurrent, moderate: Secondary | ICD-10-CM | POA: Diagnosis not present

## 2018-04-12 DIAGNOSIS — K219 Gastro-esophageal reflux disease without esophagitis: Secondary | ICD-10-CM | POA: Diagnosis not present

## 2018-04-13 DIAGNOSIS — I10 Essential (primary) hypertension: Secondary | ICD-10-CM | POA: Diagnosis not present

## 2018-04-13 DIAGNOSIS — Z85118 Personal history of other malignant neoplasm of bronchus and lung: Secondary | ICD-10-CM | POA: Diagnosis not present

## 2018-04-13 DIAGNOSIS — I4891 Unspecified atrial fibrillation: Secondary | ICD-10-CM | POA: Diagnosis not present

## 2018-04-13 DIAGNOSIS — I63312 Cerebral infarction due to thrombosis of left middle cerebral artery: Secondary | ICD-10-CM | POA: Diagnosis not present

## 2018-04-13 DIAGNOSIS — H10401 Unspecified chronic conjunctivitis, right eye: Secondary | ICD-10-CM | POA: Diagnosis not present

## 2018-04-13 DIAGNOSIS — R278 Other lack of coordination: Secondary | ICD-10-CM | POA: Diagnosis not present

## 2018-04-13 DIAGNOSIS — E785 Hyperlipidemia, unspecified: Secondary | ICD-10-CM | POA: Diagnosis not present

## 2018-04-13 DIAGNOSIS — I69354 Hemiplegia and hemiparesis following cerebral infarction affecting left non-dominant side: Secondary | ICD-10-CM | POA: Diagnosis not present

## 2018-04-13 DIAGNOSIS — I639 Cerebral infarction, unspecified: Secondary | ICD-10-CM | POA: Diagnosis not present

## 2018-04-13 DIAGNOSIS — R1310 Dysphagia, unspecified: Secondary | ICD-10-CM | POA: Diagnosis not present

## 2018-04-13 DIAGNOSIS — R41841 Cognitive communication deficit: Secondary | ICD-10-CM | POA: Diagnosis not present

## 2018-04-13 DIAGNOSIS — R279 Unspecified lack of coordination: Secondary | ICD-10-CM | POA: Diagnosis not present

## 2018-04-13 DIAGNOSIS — M6281 Muscle weakness (generalized): Secondary | ICD-10-CM | POA: Diagnosis not present

## 2018-04-13 DIAGNOSIS — R1312 Dysphagia, oropharyngeal phase: Secondary | ICD-10-CM | POA: Diagnosis not present

## 2018-04-13 DIAGNOSIS — K219 Gastro-esophageal reflux disease without esophagitis: Secondary | ICD-10-CM | POA: Diagnosis not present

## 2018-04-13 DIAGNOSIS — J449 Chronic obstructive pulmonary disease, unspecified: Secondary | ICD-10-CM | POA: Diagnosis not present

## 2018-04-13 DIAGNOSIS — R338 Other retention of urine: Secondary | ICD-10-CM | POA: Diagnosis not present

## 2018-04-18 DIAGNOSIS — I63312 Cerebral infarction due to thrombosis of left middle cerebral artery: Secondary | ICD-10-CM | POA: Diagnosis not present

## 2018-04-18 DIAGNOSIS — K219 Gastro-esophageal reflux disease without esophagitis: Secondary | ICD-10-CM | POA: Diagnosis not present

## 2018-04-18 DIAGNOSIS — R41841 Cognitive communication deficit: Secondary | ICD-10-CM | POA: Diagnosis not present

## 2018-04-18 DIAGNOSIS — R1312 Dysphagia, oropharyngeal phase: Secondary | ICD-10-CM | POA: Diagnosis not present

## 2018-04-21 DIAGNOSIS — K219 Gastro-esophageal reflux disease without esophagitis: Secondary | ICD-10-CM | POA: Diagnosis not present

## 2018-04-21 DIAGNOSIS — R1312 Dysphagia, oropharyngeal phase: Secondary | ICD-10-CM | POA: Diagnosis not present

## 2018-04-21 DIAGNOSIS — R41841 Cognitive communication deficit: Secondary | ICD-10-CM | POA: Diagnosis not present

## 2018-04-21 DIAGNOSIS — I63312 Cerebral infarction due to thrombosis of left middle cerebral artery: Secondary | ICD-10-CM | POA: Diagnosis not present

## 2018-04-24 DIAGNOSIS — R1312 Dysphagia, oropharyngeal phase: Secondary | ICD-10-CM | POA: Diagnosis not present

## 2018-04-24 DIAGNOSIS — I63312 Cerebral infarction due to thrombosis of left middle cerebral artery: Secondary | ICD-10-CM | POA: Diagnosis not present

## 2018-04-24 DIAGNOSIS — K219 Gastro-esophageal reflux disease without esophagitis: Secondary | ICD-10-CM | POA: Diagnosis not present

## 2018-04-24 DIAGNOSIS — R41841 Cognitive communication deficit: Secondary | ICD-10-CM | POA: Diagnosis not present

## 2018-04-25 DIAGNOSIS — R1312 Dysphagia, oropharyngeal phase: Secondary | ICD-10-CM | POA: Diagnosis not present

## 2018-04-25 DIAGNOSIS — R41841 Cognitive communication deficit: Secondary | ICD-10-CM | POA: Diagnosis not present

## 2018-04-25 DIAGNOSIS — K219 Gastro-esophageal reflux disease without esophagitis: Secondary | ICD-10-CM | POA: Diagnosis not present

## 2018-04-25 DIAGNOSIS — I63312 Cerebral infarction due to thrombosis of left middle cerebral artery: Secondary | ICD-10-CM | POA: Diagnosis not present

## 2018-04-27 DIAGNOSIS — I63312 Cerebral infarction due to thrombosis of left middle cerebral artery: Secondary | ICD-10-CM | POA: Diagnosis not present

## 2018-04-27 DIAGNOSIS — R1310 Dysphagia, unspecified: Secondary | ICD-10-CM | POA: Diagnosis not present

## 2018-04-27 DIAGNOSIS — R41841 Cognitive communication deficit: Secondary | ICD-10-CM | POA: Diagnosis not present

## 2018-04-27 DIAGNOSIS — M6281 Muscle weakness (generalized): Secondary | ICD-10-CM | POA: Diagnosis not present

## 2018-04-27 DIAGNOSIS — R1312 Dysphagia, oropharyngeal phase: Secondary | ICD-10-CM | POA: Diagnosis not present

## 2018-04-27 DIAGNOSIS — K219 Gastro-esophageal reflux disease without esophagitis: Secondary | ICD-10-CM | POA: Diagnosis not present

## 2018-05-01 DIAGNOSIS — H35033 Hypertensive retinopathy, bilateral: Secondary | ICD-10-CM | POA: Diagnosis not present

## 2018-05-01 DIAGNOSIS — H2513 Age-related nuclear cataract, bilateral: Secondary | ICD-10-CM | POA: Diagnosis not present

## 2018-05-09 ENCOUNTER — Ambulatory Visit: Payer: Medicare Other | Admitting: Neurology

## 2018-05-10 DIAGNOSIS — F331 Major depressive disorder, recurrent, moderate: Secondary | ICD-10-CM | POA: Diagnosis not present

## 2018-05-10 DIAGNOSIS — G4700 Insomnia, unspecified: Secondary | ICD-10-CM | POA: Diagnosis not present

## 2018-05-10 DIAGNOSIS — F419 Anxiety disorder, unspecified: Secondary | ICD-10-CM | POA: Diagnosis not present

## 2018-05-12 DIAGNOSIS — E785 Hyperlipidemia, unspecified: Secondary | ICD-10-CM | POA: Diagnosis not present

## 2018-05-12 DIAGNOSIS — R338 Other retention of urine: Secondary | ICD-10-CM | POA: Diagnosis not present

## 2018-05-12 DIAGNOSIS — I10 Essential (primary) hypertension: Secondary | ICD-10-CM | POA: Diagnosis not present

## 2018-05-12 DIAGNOSIS — J449 Chronic obstructive pulmonary disease, unspecified: Secondary | ICD-10-CM | POA: Diagnosis not present

## 2018-05-12 DIAGNOSIS — I639 Cerebral infarction, unspecified: Secondary | ICD-10-CM | POA: Diagnosis not present

## 2018-05-12 DIAGNOSIS — R1310 Dysphagia, unspecified: Secondary | ICD-10-CM | POA: Diagnosis not present

## 2018-05-12 DIAGNOSIS — I4891 Unspecified atrial fibrillation: Secondary | ICD-10-CM | POA: Diagnosis not present

## 2018-05-12 DIAGNOSIS — I69354 Hemiplegia and hemiparesis following cerebral infarction affecting left non-dominant side: Secondary | ICD-10-CM | POA: Diagnosis not present

## 2018-05-12 DIAGNOSIS — H10401 Unspecified chronic conjunctivitis, right eye: Secondary | ICD-10-CM | POA: Diagnosis not present

## 2018-05-12 DIAGNOSIS — M6281 Muscle weakness (generalized): Secondary | ICD-10-CM | POA: Diagnosis not present

## 2018-05-12 DIAGNOSIS — K219 Gastro-esophageal reflux disease without esophagitis: Secondary | ICD-10-CM | POA: Diagnosis not present

## 2018-05-12 DIAGNOSIS — Z85118 Personal history of other malignant neoplasm of bronchus and lung: Secondary | ICD-10-CM | POA: Diagnosis not present

## 2018-05-19 ENCOUNTER — Other Ambulatory Visit: Payer: Self-pay | Admitting: Urology

## 2018-05-19 DIAGNOSIS — C3411 Malignant neoplasm of upper lobe, right bronchus or lung: Secondary | ICD-10-CM

## 2018-05-30 ENCOUNTER — Ambulatory Visit (INDEPENDENT_AMBULATORY_CARE_PROVIDER_SITE_OTHER): Payer: Medicare Other | Admitting: Neurology

## 2018-05-30 ENCOUNTER — Encounter: Payer: Self-pay | Admitting: Neurology

## 2018-05-30 VITALS — BP 106/61 | HR 69 | Ht 67.0 in | Wt 105.0 lb

## 2018-05-30 DIAGNOSIS — I471 Supraventricular tachycardia: Secondary | ICD-10-CM

## 2018-05-30 DIAGNOSIS — K219 Gastro-esophageal reflux disease without esophagitis: Secondary | ICD-10-CM | POA: Diagnosis not present

## 2018-05-30 DIAGNOSIS — J449 Chronic obstructive pulmonary disease, unspecified: Secondary | ICD-10-CM | POA: Diagnosis not present

## 2018-05-30 DIAGNOSIS — Z85118 Personal history of other malignant neoplasm of bronchus and lung: Secondary | ICD-10-CM | POA: Diagnosis not present

## 2018-05-30 DIAGNOSIS — I69359 Hemiplegia and hemiparesis following cerebral infarction affecting unspecified side: Secondary | ICD-10-CM | POA: Diagnosis not present

## 2018-05-30 DIAGNOSIS — E785 Hyperlipidemia, unspecified: Secondary | ICD-10-CM | POA: Diagnosis not present

## 2018-05-30 DIAGNOSIS — I63412 Cerebral infarction due to embolism of left middle cerebral artery: Secondary | ICD-10-CM | POA: Diagnosis not present

## 2018-05-30 DIAGNOSIS — I10 Essential (primary) hypertension: Secondary | ICD-10-CM | POA: Diagnosis not present

## 2018-05-30 DIAGNOSIS — I69354 Hemiplegia and hemiparesis following cerebral infarction affecting left non-dominant side: Secondary | ICD-10-CM | POA: Diagnosis not present

## 2018-05-30 DIAGNOSIS — R338 Other retention of urine: Secondary | ICD-10-CM | POA: Diagnosis not present

## 2018-05-30 DIAGNOSIS — I63312 Cerebral infarction due to thrombosis of left middle cerebral artery: Secondary | ICD-10-CM

## 2018-05-30 DIAGNOSIS — Z7901 Long term (current) use of anticoagulants: Secondary | ICD-10-CM | POA: Insufficient documentation

## 2018-05-30 DIAGNOSIS — R1310 Dysphagia, unspecified: Secondary | ICD-10-CM | POA: Diagnosis not present

## 2018-05-30 DIAGNOSIS — I639 Cerebral infarction, unspecified: Secondary | ICD-10-CM | POA: Diagnosis not present

## 2018-05-30 DIAGNOSIS — M6281 Muscle weakness (generalized): Secondary | ICD-10-CM | POA: Diagnosis not present

## 2018-05-30 DIAGNOSIS — H10401 Unspecified chronic conjunctivitis, right eye: Secondary | ICD-10-CM | POA: Diagnosis not present

## 2018-05-30 DIAGNOSIS — I4891 Unspecified atrial fibrillation: Secondary | ICD-10-CM | POA: Diagnosis not present

## 2018-05-30 DIAGNOSIS — I5032 Chronic diastolic (congestive) heart failure: Secondary | ICD-10-CM

## 2018-05-30 NOTE — Progress Notes (Signed)
PATIENT: Kayla Kent DOB: 09/28/1932  REASON FOR VISIT: follow up- seizures, now with STROKE   HISTORY FROM: patient and daughter -   HISTORY OF PRESENT ILLNESS:  Interval history from 30 May 2018.  08/13/19I have the pleasure of seeing Kayla Kent. Mastrianni today, and meanwhile 84 year old Caucasian female patient whom I have followed for the last 4 or even 5 years.  Usually we have followed for seizures, but at this time her medical interval history has been quite different.  Her daughter gives me the following information she states that her mother fell at home was brought to the  emergency room on 20 November.  The patient had been typically anticoagulated due to atrial fibrillation, diastolic heart failure, these anticoagulants were discontinued as the emergency room provider stated that the patient arrived confused and was had bloody emesis- a fact the patient denies, she stated she had thrown up tomato soup.  The patient was scheduled to be evaluated for a presumed bleeding ulcer or gastritis, and an EGD was performed on 1123- 3 days after her admission.  It had been decided that she should also have a colonoscopy, but the colonoscopy never took place.  In the meantime the patient became confused indeed and it turned out that she developed a urinary tract infection with urosepsis.  According to her daughter she was very confused appeared almost delirious. She was tachycardic, in atrial fibrillation,  And hypoxemic.  The patient was first considered to be discharged into a regular care unit had now to be transferred to an ICU setting.  On 1125 during shift change at 7 PM the staff noted that she was aphasic acutely hemiplegic on her right and had respiratory distress. A code stroke was called, CT revealed an hyperdense sign in the MCA on the left.  The patient was not considered for TPA because of the presumed GI bleed even as her Hct/ Hgb was stabile. She underwent thrombectomy .  She  recovered afterwards very slowly. She developed an aspiration pneumonia.    05-09-2017, Kayla Kent is a meanwhile 84 year old sprite full female with a history of seizures that began only 4 years ago. She has continued to take Keppra 250 mA twice a day with good success. She lives independently he can perform all ADLs independently, she drives. She enjoys eating out. Participates in exercise classes and has a Physiological scientist 2 days a week. Her grandchildren attend [place] [employer] and [employer].  Kayla Kent is an 84 year old female with a history of seizures. She returns today for follow-up. She continues on Keppra 250 mg twice a day. No seizure events. Able to complete all ADLs independently. Lives at home alone. No changes in mood or behavior. No change in gait or balance. She is able to prepare her own meals. Handles her own finances without difficulty. Participates in an exercise class. Reports that she is very social. Denies any new neurological symptoms. Returns today for an evaluation.  HISTORY 05/06/15 (MM): Kayla Kent is an 84 year old female with a history of seizures. She returns today for an evaluation. The patient has remained on Keppra 250 mg twice a day. She denies any seizure events. She operates a Teacher, music without difficulty. He is able to complete all ADLs independently. Since the last visit she did have a fall while doing yardwork and unfortunately fractured her right wrist and right hip. She underwent surgery and has recovered quite nicely. She continues to live alone although now she has help with  yard work. Denies any new neurological symptoms. She returns today for an evaluation.  HISTORY 03/28/14 (CD): Kayla Kent is a 84 y.o., caucasian , widowed female here as a yearly routine follow up upon initial referral from Dr. Linda Hedges for seizure versus syncope.The patient was originally sent to [facility] in 2013 for anevaluation of possible seizures. The first spell  occurred in March 2013 , [place]  The patient was sitting on a bench in the evening watching relatives play ball and suddenly had " this feeling of being pulled forward", her vision changed to gray, she could hear people around her -but could not respond. Her daughter witnessed the event and stated that her mother did not fall from the chair , but she threw her hands up for seconds, did not respond verbally.  The patient underwent a glucose check and electrolyte check at the ED, which were normal. Then she had a second event during a track meet ,watching her grandson run and the same spell happened to her. She recalls a woman, standing near to her, who was a nurse And checked her heart beat.  She came quickly back to normal levels of awareness . She never convulsed. She also overheard when another person in the bleachers announced she Will call 911 and she responded verbally : " no".  There was no Confusion, impairment or any other symptoms . There were no cardiac events- after Dr. Linda Hedges evaluated the Patient.  Again, differential diagnosis included syncope. Dr. Jacelyn Grip told her that he did not establish a diagnosis , but he started her on Keppra , ( for seizures ) she worked up to 2 tablets in the morning and 2 in p.m. She had no side effects reported at the time. A MRI of the brain documented right anterior temporal encephalomalacia. EEG was non conclusive.  REVIEW OF SYSTEMS: Out of a complete 14 system review of symptoms, the patient complains only of the following symptoms, and all other reviewed systems are negative.  Frequency of urination, environmental allergies, food allergies, bruise easily, lazy eye.   ALLERGIES: Allergies  Allergen Reactions  . Chocolate Hives  . Codeine Other (See Comments)    Patient states she acts crazy  . Diazepam Other (See Comments)    Patient states she acts crazy.  . Fruit & Vegetable Daily [Nutritional  Supplements] Hives and Swelling    peaches  . Iohexol Hives     Code: HIVES, Desc: pt gets 13 hr pre-meds, Onset Date: [id number]   . Latex Hives  . Peach [Prunus Persica] Hives  . Peanut-Containing Drug Products Hives and Swelling  . Penicillins Hives, Itching and Swelling    Has patient had a PCN reaction causing immediate rash, facial/tongue/throat swelling, SOB or lightheadedness with hypotension: Yes Has patient had a PCN reaction causing severe rash involving mucus membranes or skin necrosis: No Has patient had a PCN reaction that required hospitalization No Has patient had a PCN reaction occurring within the last 10 years: No If all of the above answers are "NO", then may proceed with Cephalosporin use.   . Strawberry Extract Hives  . Sulfonamide Derivatives Hives    nausea  . Wheat     Daughter states this is an error, that she is not allergic to wheat products  . Wheat Bran     Patient family states this is an error- she is not allergic to wheat products  . Sulfamethoxazole Hives  .  Crestor [Rosuvastatin Calcium] Rash  . Iodine Rash    HOME MEDICATIONS: Outpatient Medications Prior to Visit  Medication Sig Dispense Refill  . acetaminophen (TYLENOL) 160 MG/5ML solution Place 15.6 mLs (500 mg total) into feeding tube every 4 (four) hours as needed for mild pain or headache. (Patient taking differently: Take 500 mg by mouth every 4 (four) hours as needed for mild pain or headache. ) 120 mL 0  . alum & mag hydroxide-simeth (MAALOX/MYLANTA) 200-200-20 MG/5ML suspension Place 30 mLs into feeding tube every 4 (four) hours as needed for indigestion or heartburn. (Patient taking differently: Take 30 mLs by mouth every 4 (four) hours as needed for indigestion or heartburn. ) 355 mL 0  . apixaban (ELIQUIS) 2.5 MG TABS tablet Place 1 tablet (2.5 mg total) into feeding tube 2 (two) times daily. (Patient taking differently: Take 2.5 mg by mouth 2 (two) times daily. ) 60 tablet   . aspirin  81 MG chewable tablet Place 1 tablet (81 mg total) into feeding tube daily.    Marland Kitchen atorvastatin (LIPITOR) 10 MG tablet     . bethanechol (URECHOLINE) 10 MG tablet Take 1 tablet (10 mg total) by mouth 3 (three) times daily.    . bisacodyl (DULCOLAX) 10 MG suppository Place 1 suppository (10 mg total) rectally daily as needed for moderate constipation. 12 suppository 0  . chlorhexidine gluconate, MEDLINE KIT, (PERIDEX) 0.12 % solution 15 mLs by Mouth Rinse route 2 (two) times daily. 120 mL 0  . escitalopram (LEXAPRO) 5 MG tablet Take 1 tablet (5 mg total) by mouth daily.    . Melatonin 3 MG TABS Take 0.5 tablets (1.5 mg total) by mouth at bedtime. (Patient taking differently: Take 3 mg by mouth at bedtime. )  0  . metoprolol tartrate (LOPRESSOR) 25 mg/10 mL SUSP Place 10 mLs (25 mg total) into feeding tube 2 (two) times daily.    Marland Kitchen mouth rinse LIQD solution 15 mLs by Mouth Rinse route 2 times daily at 12 noon and 4 pm.  0  . polyethylene glycol (MIRALAX / GLYCOLAX) packet Place 17 g into feeding tube 2 (two) times daily with breakfast and lunch. 14 each 0  . ranitidine (ZANTAC) 150 MG tablet Take 150 mg by mouth daily.    Marland Kitchen atorvastatin (LIPITOR) 20 MG tablet Place 1 tablet (20 mg total) into feeding tube daily at 6 PM. (Patient taking differently: Take 10 mg by mouth daily at 6 PM. )    . collagenase (SANTYL) ointment Apply topically daily. To sacral wound, cover with moist 2 X 2 and foam dressing. Change daily and more frequently if soiled 15 g 0  . Hydrocortisone (GERHARDT'S BUTT CREAM) CREA Apply 1 application topically 4 (four) times daily.    Marland Kitchen ipratropium-albuterol (DUONEB) 0.5-2.5 (3) MG/3ML SOLN Take 3 mLs by nebulization every 4 (four) hours as needed. 360 mL   . loratadine (CLARITIN) 10 MG tablet Take 10 mg by mouth daily.    . ondansetron (ZOFRAN) 4 MG tablet Take 1 tablet (4 mg total) by mouth every 6 (six) hours as needed for nausea or vomiting. 20 tablet 0  . simethicone (MYLICON) 40  BD/5.3GD drops Place 0.6 mLs (40 mg total) into feeding tube 4 (four) times daily as needed for flatulence. 30 mL 0  . sodium chloride (OCEAN) 0.65 % SOLN nasal spray Place 1 spray into both nostrils as needed for congestion.  0  . sodium phosphate (FLEET) 7-19 GM/118ML ENEM Place 133 mLs (1 enema total)  rectally daily as needed for severe constipation.  0  . trimethoprim-polymyxin b (POLYTRIM) ophthalmic solution Place 1 drop into the right eye 4 (four) times daily. 10 mL 0  . Water For Irrigation, Sterile (FREE WATER) SOLN Place 200 mLs into feeding tube every 8 (eight) hours.    . Nutritional Supplements (FEEDING SUPPLEMENT, OSMOLITE 1.5 CAL,) LIQD Place 220 mLs into feeding tube 5 (five) times daily.  0   No facility-administered medications prior to visit.     PAST MEDICAL HISTORY: Past Medical History:  Diagnosis Date  . Allergic rhinitis   . Anxiety   . CKD (chronic kidney disease) stage 3, GFR 30-59 ml/min (HCC)   . Emphysema of lung ([place])   . HLD (hyperlipidemia)   . HTN (hypertension)   . Intolerance of drug    orthostatic  . Paroxysmal supraventricular tachycardia ([place])   . PVD (peripheral vascular disease) ([place])   . Seizure ([place])   . Syncope and collapse   . Uterine prolapse without mention of vaginal wall prolapse     PAST SURGICAL HISTORY: Past Surgical History:  Procedure Laterality Date  . CARDIOVERSION N/A 11/05/2014   Procedure: CARDIOVERSION;  Surgeon: Candee Furbish, MD;  Location: [facility] ENDOSCOPY;  Service: Cardiovascular;  Laterality: N/A;  . cataract surgery  08/2015  . CHOLECYSTECTOMY    . corrective eye surgery     as a child  . ESOPHAGOGASTRODUODENOSCOPY N/A 09/09/2017   Procedure: ESOPHAGOGASTRODUODENOSCOPY (EGD);  Surgeon: Mauri Pole, MD;  Location: [facility] ENDOSCOPY;  Service: Endoscopy;  Laterality: N/A;  . INTRAMEDULLARY (IM) NAIL INTERTROCHANTERIC Left 11/21/2016   Procedure: INTRAMEDULLARY (IM) NAIL INTERTROCHANTRIC HEMI;  Surgeon: Newt Minion,  MD;  Location: [facility];  Service: Orthopedics;  Laterality: Left;  . IR GASTROSTOMY TUBE MOD SED  09/26/2017  . IR PERCUTANEOUS ART THROMBECTOMY/INFUSION INTRACRANIAL INC DIAG ANGIO  09/11/2017  . IVD removed    . OPEN REDUCTION INTERNAL FIXATION (ORIF) DISTAL RADIAL FRACTURE Right 08/29/2014   Procedure: OPEN REDUCTION INTERNAL FIXATION (ORIF) DISTAL RADIAL FRACTURE;  Surgeon: Marianna Payment, MD;  Location: [facility];  Service: Orthopedics;  Laterality: Right;  . RADIOLOGY WITH ANESTHESIA N/A 09/11/2017   Procedure: RADIOLOGY WITH ANESTHESIA;  Surgeon: Luanne Bras, MD;  Location: [facility];  Service: Radiology;  Laterality: N/A;  . RF ablation PSVT     summer '10  . stress cardiolite  08/05/93  . TONSILLECTOMY    . TOTAL HIP ARTHROPLASTY Right 08/29/2014   Procedure: Right Hip Hemi Arthroplasty;  Surgeon: Marianna Payment, MD;  Location: [facility];  Service: Orthopedics;  Laterality: Right;  Hip procedure 1st wants Peg Board, [employer], [place].     FAMILY HISTORY: Family History  Problem Relation Age of Onset  . Mental illness Father        suicide  . Arthritis Father   . Hyperlipidemia Sister   . Hypertension Sister   . Heart disease Brother        CAD/MI  . Hypertension Brother   . Hypertension Unknown        family hx  . Colon cancer Neg Hx   . Breast cancer Neg Hx   . Diabetes Neg Hx   . Stroke Neg Hx   . Cancer Neg Hx   . Lung disease Neg Hx     SOCIAL HISTORY: Social History   Socioeconomic History  . Marital status: Widowed    Spouse name: Not on file  . Number of children: 2  .  Years of education: 77  . Highest education level: Not on file  Occupational History  . Occupation: retired Radiation protection practitioner  . Financial resource strain: Not on file  . Food insecurity:    Worry: Not on file    Inability: Not on file  . Transportation needs:    Medical: Not on file    Non-medical: Not on file  Tobacco Use  . Smoking status: Former Smoker     Packs/day: 0.25    Years: 38.00    Pack years: 9.50    Start date: 02/15/1952    Last attempt to quit: 10/18/1993    Years since quitting: 24.6  . Smokeless tobacco: Never Used  Substance and Sexual Activity  . Alcohol use: No    Alcohol/week: 0.0 standard drinks  . Drug use: No  . Sexual activity: Never  Lifestyle  . Physical activity:    Days per week: Not on file    Minutes per session: Not on file  . Stress: Not on file  Relationships  . Social connections:    Talks on phone: Not on file    Gets together: Not on file    Attends religious service: Not on file    Active member of club or organization: Not on file    Attends meetings of clubs or organizations: Not on file    Relationship status: Not on file  . Intimate partner violence:    Fear of current or ex partner: Not on file    Emotionally abused: Not on file    Physically abused: Not on file    Forced sexual activity: Not on file  Other Topics Concern  . Not on file  Social History Narrative   [employer], [employer]-BA, [employer]-early childhood. Married '55 -2 years.  1 son - 96; 1 daughter - 6; 3 grandchildren . Lives alone. ACP - discussed and provided packet on end of life care (Feb '13)   Patient is now widowed.   Patient is right-handed.   Patient drinks tea daily.      [facility] (04/26/17):   Originally from [facility]. Previously was a Pharmacist, hospital. No pets currently. No mold exposure. No bird exposure.       PHYSICAL EXAM  Vitals:   05/30/18 1058  BP: 106/61  Pulse: 69  Weight: 105 lb (47.6 kg)  Height: 5' 7"  (1.702 m)   Body mass index is 16.45 kg/m.  Generalized:  Patient has lost weight, had 88 pounds body weight at discharge. Now 105 pounds, but cachectic looking. walks with walker, which is new.  Neurological examination  Mentation: Alert oriented to time, place, history taking. Follows all commands - speech is comprehended well, but speech is haltered. Right sided weakness, dominant hand ,  arm and leg.  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  g facial droop on the right. Uvula not longer in midline. Head turning and shoulder shrug  were asymmetric.  Visual field intact.   Motor: The patient demonstrates a spasticity based muscle tone in her right biceps, she is able to extend and flex her arm, she does have less movement at the wrist, her fingers are flexed at baseline and she cannot fully extend the fingers of the right hand.  She did provide a pretty good grip strength.  The same is true for the left side here without any kind of spasticity being noticed and she would perform a finger-nose-finger  test without dysmetria or ataxia or tremor.  Her lower extremities are less affected, she is able to flex the right knee and can also mildly dorsiflex the right foot.  The range of motion is less meant to her unaffected side, but enough to maneuver with a walker.  Sensory: Sensory testing is intact to soft touch on all 4 extremities. No evidence of extinction is noted.  Coordination: hemiparesis   Gait and station: Gait is normal. Reflexes:  Right hemiparesis. spasticity brisk reflexes on the right,  Ipgoing babinski   DIAGNOSTIC DATA (LABS, IMAGING, TESTING) - I reviewed patient records, labs, notes, testing and imaging myself where available.  Hospital records, images and labs reviewed.   Lab Results  Component Value Date   WBC 7.7 02/20/2018   HGB 13.5 02/20/2018   HCT 41.8 02/20/2018   MCV 87 02/20/2018   PLT 270 02/20/2018      Component Value Date/Time   NA 140 02/20/2018 1112   K 5.1 02/20/2018 1112   CL 101 02/20/2018 1112   CO2 24 02/20/2018 1112   GLUCOSE 72 02/20/2018 1112   GLUCOSE 106 (H) 10/20/2017 1413   GLUCOSE 107 (H) 10/25/2006 0906   BUN 18 02/20/2018 1112   BUN 19.8 08/23/2017 1504   CREATININE 0.79 02/20/2018 1112   CREATININE 1.3 (H) 08/23/2017 1504   CALCIUM 8.8 02/20/2018 1112    PROT 4.9 (L) 09/29/2017 0635   ALBUMIN 2.1 (L) 09/29/2017 0635   AST 22 09/29/2017 0635   ALT 21 09/29/2017 0635   ALKPHOS 62 09/29/2017 0635   BILITOT 0.3 09/29/2017 0635   GFRNONAA 69 02/20/2018 1112   GFRAA 79 02/20/2018 1112   Lab Results  Component Value Date   CHOL 132 10/30/2015   HDL 33 (L) 10/30/2015   LDLCALC 85 10/30/2015   LDLDIRECT 175.3 01/19/2008   TRIG 99 09/14/2017   CHOLHDL 4.0 10/30/2015         ASSESSMENT AND PLAN 84 y.o. year old female  has a past medical history of Allergic rhinitis, Anxiety, CKD (chronic kidney disease) stage 3, GFR 30-59 ml/min (HCC), Emphysema of lung (HCC), HLD (hyperlipidemia), HTN (hypertension), Intolerance of drug, Paroxysmal supraventricular tachycardia ([place]), PVD (peripheral vascular disease) ([place]), Seizure ([place]), Syncope and collapse, and Uterine prolapse without mention of vaginal wall prolapse. here with: Seizures with sudden collapse- have not returned since she takes Kepra , low dose.    She had an embolic stroke in nov 2018 as she has atrial fibrillation, and was not anticoagulated at the day of the stroke for 4-5 days.  She developed urosepsis out of a UTI which was present at the time of admission.  She developed aspiration pneumonia after the hemiparetic stroke.    Larey Seat, MD  05/30/2018, 11:14 AM [facility] [address], [place], [place] [address] [phone]    Cc Dorris Carnes, MD

## 2018-05-30 NOTE — Patient Instructions (Signed)
Rehabilitation After a Stroke, Adult A stroke causes damage to the brain cells, which can affect your ability to walk, talk, or remember things. The impact of a stroke is different for everyone, and so is recovery. Some people have progress during the first few days after treatment. Others may take weeks or longer to make progress. Stroke rehabilitation includes a variety of treatments to help you recover and promote your independence after a stroke. You may not be able do everything that you did before the stroke, but you can learn ways to manage your lifestyle and be as independent as possible. Rehabilitation will start as soon as you are able to participate after your stroke, and it involves care from a team that may include:  Family and friends. Your loved ones know you best and can be very helpful in your recovery.  Physicians.  Nurses.  Physical and occupational therapists.  Speech-language therapists.  A nutritionist.  A psychologist.  A social worker.  Keep open communication with all members of your care team. Share your medical records if needed, and take notes about each provider's recommendations. What is physical therapy? Physical therapists (PTs) help you to improve your coordination, balance, and muscle strength. Physical therapy may involve:  Range of motion exercises.  Help to move between lying, sitting, and standing positions.  Walking with a cane or walker, if needed.  Help using stairs.  What is occupational therapy? Occupational therapists (OTs) help you rebuild your ability to do everyday tasks, such as brushing your teeth, going to the bathroom, eating, and getting dressed. Occupational therapy may also help with:  Vision. Visual scanning is a technique that is used to prevent falls.  Memory and cognitive training. This therapy includes problem-solving techniques and relearning tasks like making a phone call.  Fine muscle movements such as buttoning a  shirt or picking up small objects.  What is speech therapy? Speech-language therapists help you communicate. After a stroke, you may have problems understanding what people are saying, or you may have trouble writing, speaking, or finding the right word for what you want to say. You may also need speech therapy if you have difficulty swallowing while eating and drinking. Examples of speech-language therapies include:  Techniques to strengthen muscles used in swallowing.  Naming objects or describing pictures. This helps retrain the brain to recognize and remember words.  Exercises to strengthen the muscles involved in talking, including your tongue and lips.  Exercises to retrain your brain in understanding what you read and hear.  How often will I need therapy? Therapy will begin as soon as you are able to participate, which is often within the first few days after a stroke. Sessions will be frequent at first. For example, you may have therapy 2-3 hours a day on most days of the week during the first few months. The intensity depends on the type and severity of your stroke. You may need therapy for several months. Therapy may take place in the hospital, at a rehabilitation center, or in your home. Are there any side effects of therapy? Therapy is safe and is usually well-tolerated. You may feel physically and mentally tired after therapy, especially during the first few weeks. Rest before therapy sessions if you need to so you can get the most out of your rehabilitation. Follow these instructions at home:  Involve your family and friends in your recovery, if possible. Having another person to encourage you is beneficial.  Follow instructions from your speech-language therapist,   nutritionist, or health care provider about what you can safely eat and drink. Eat healthy foods. If your ability to swallow was affected by the stroke, you may need to take steps to avoid choking, such as: ? Taking  small bites when eating. ? Eating foods that are soft or pureed. ? Drinking liquids that have been thickened.  Maintain social connections and interactions with friends, family, and community groups. This is an important part of your recovery. Communication challenges and physical challenges may cause you to feel isolated after a stroke.  Consider joining a support group that allows you to talk about the impact of stroke on your life. A psychologist or counselor may be recommended. Your emotional recovery from stroke is just as important as your physical recovery.  Keep all follow-up visits as told by your health care providers. This is important. Summary  Stroke rehabilitation includes a variety of treatments to help you recover and promote your independence after a stroke.  Rehabilitation will start as soon as you are able to participate after your stroke, and it includes care from a team of experts.  The intensity of therapy depends on the type and severity of your stroke. You may need therapy for several months. This information is not intended to replace advice given to you by your health care provider. Make sure you discuss any questions you have with your health care provider. Document Released: 10/24/2007 Document Revised: 10/05/2016 Document Reviewed: 10/05/2016 Elsevier Interactive Patient Education  2018 Elsevier Inc.  

## 2018-05-30 NOTE — Telephone Encounter (Signed)
This encounter was created in error - please disregard.

## 2018-05-31 DIAGNOSIS — I2510 Atherosclerotic heart disease of native coronary artery without angina pectoris: Secondary | ICD-10-CM | POA: Diagnosis not present

## 2018-05-31 DIAGNOSIS — E7800 Pure hypercholesterolemia, unspecified: Secondary | ICD-10-CM | POA: Diagnosis not present

## 2018-05-31 DIAGNOSIS — R634 Abnormal weight loss: Secondary | ICD-10-CM | POA: Diagnosis not present

## 2018-06-01 ENCOUNTER — Telehealth: Payer: Self-pay | Admitting: Radiation Oncology

## 2018-06-01 ENCOUNTER — Inpatient Hospital Stay: Payer: Medicare Other | Attending: Radiation Oncology

## 2018-06-01 ENCOUNTER — Ambulatory Visit (HOSPITAL_COMMUNITY)
Admission: RE | Admit: 2018-06-01 | Discharge: 2018-06-01 | Disposition: A | Discharge status: Home / Self Care | Payer: Medicare Other | Source: Ambulatory Visit | Attending: Radiation Oncology | Admitting: Radiation Oncology

## 2018-06-01 DIAGNOSIS — C3411 Malignant neoplasm of upper lobe, right bronchus or lung: Secondary | ICD-10-CM | POA: Diagnosis not present

## 2018-06-01 DIAGNOSIS — J8410 Pulmonary fibrosis, unspecified: Secondary | ICD-10-CM | POA: Diagnosis not present

## 2018-06-01 DIAGNOSIS — I700 Atherosclerosis of aorta: Secondary | ICD-10-CM | POA: Insufficient documentation

## 2018-06-01 DIAGNOSIS — K449 Diaphragmatic hernia without obstruction or gangrene: Secondary | ICD-10-CM | POA: Diagnosis not present

## 2018-06-01 DIAGNOSIS — J439 Emphysema, unspecified: Secondary | ICD-10-CM | POA: Insufficient documentation

## 2018-06-01 DIAGNOSIS — I2510 Atherosclerotic heart disease of native coronary artery without angina pectoris: Secondary | ICD-10-CM | POA: Insufficient documentation

## 2018-06-01 LAB — BUN & CREATININE (CHCC)
BUN: 13 mg/dL (ref 8–23)
Creatinine: 0.83 mg/dL (ref 0.44–1.00)
GFR, Estimated: >60 mL/min (ref >60)

## 2018-06-01 NOTE — Telephone Encounter (Signed)
I spoke with the patient's daughter as her mother remains in SNF given her deficits resulting from her stroke. There is a small 3 mm lesion that is new in the lung and we discussed repeating her imaging in 3 months without contrast. If this shows stability,we could consider moving to a 6 month interval for scanning.

## 2018-06-03 DIAGNOSIS — K219 Gastro-esophageal reflux disease without esophagitis: Secondary | ICD-10-CM | POA: Diagnosis not present

## 2018-06-03 DIAGNOSIS — I63312 Cerebral infarction due to thrombosis of left middle cerebral artery: Secondary | ICD-10-CM | POA: Diagnosis not present

## 2018-06-03 DIAGNOSIS — R41841 Cognitive communication deficit: Secondary | ICD-10-CM | POA: Diagnosis not present

## 2018-06-03 DIAGNOSIS — R1312 Dysphagia, oropharyngeal phase: Secondary | ICD-10-CM | POA: Diagnosis not present

## 2018-06-04 DIAGNOSIS — R41841 Cognitive communication deficit: Secondary | ICD-10-CM | POA: Diagnosis not present

## 2018-06-04 DIAGNOSIS — K219 Gastro-esophageal reflux disease without esophagitis: Secondary | ICD-10-CM | POA: Diagnosis not present

## 2018-06-04 DIAGNOSIS — I63312 Cerebral infarction due to thrombosis of left middle cerebral artery: Secondary | ICD-10-CM | POA: Diagnosis not present

## 2018-06-04 DIAGNOSIS — R1312 Dysphagia, oropharyngeal phase: Secondary | ICD-10-CM | POA: Diagnosis not present

## 2018-06-05 DIAGNOSIS — I63312 Cerebral infarction due to thrombosis of left middle cerebral artery: Secondary | ICD-10-CM | POA: Diagnosis not present

## 2018-06-05 DIAGNOSIS — K219 Gastro-esophageal reflux disease without esophagitis: Secondary | ICD-10-CM | POA: Diagnosis not present

## 2018-06-05 DIAGNOSIS — R1312 Dysphagia, oropharyngeal phase: Secondary | ICD-10-CM | POA: Diagnosis not present

## 2018-06-05 DIAGNOSIS — R41841 Cognitive communication deficit: Secondary | ICD-10-CM | POA: Diagnosis not present

## 2018-06-06 DIAGNOSIS — R1312 Dysphagia, oropharyngeal phase: Secondary | ICD-10-CM | POA: Diagnosis not present

## 2018-06-06 DIAGNOSIS — K219 Gastro-esophageal reflux disease without esophagitis: Secondary | ICD-10-CM | POA: Diagnosis not present

## 2018-06-06 DIAGNOSIS — I63312 Cerebral infarction due to thrombosis of left middle cerebral artery: Secondary | ICD-10-CM | POA: Diagnosis not present

## 2018-06-06 DIAGNOSIS — R41841 Cognitive communication deficit: Secondary | ICD-10-CM | POA: Diagnosis not present

## 2018-06-07 DIAGNOSIS — R1312 Dysphagia, oropharyngeal phase: Secondary | ICD-10-CM | POA: Diagnosis not present

## 2018-06-07 DIAGNOSIS — I63312 Cerebral infarction due to thrombosis of left middle cerebral artery: Secondary | ICD-10-CM | POA: Diagnosis not present

## 2018-06-07 DIAGNOSIS — F331 Major depressive disorder, recurrent, moderate: Secondary | ICD-10-CM | POA: Diagnosis not present

## 2018-06-07 DIAGNOSIS — K219 Gastro-esophageal reflux disease without esophagitis: Secondary | ICD-10-CM | POA: Diagnosis not present

## 2018-06-07 DIAGNOSIS — R41841 Cognitive communication deficit: Secondary | ICD-10-CM | POA: Diagnosis not present

## 2018-06-07 DIAGNOSIS — F419 Anxiety disorder, unspecified: Secondary | ICD-10-CM | POA: Diagnosis not present

## 2018-06-07 DIAGNOSIS — G4700 Insomnia, unspecified: Secondary | ICD-10-CM | POA: Diagnosis not present

## 2018-06-08 DIAGNOSIS — I63312 Cerebral infarction due to thrombosis of left middle cerebral artery: Secondary | ICD-10-CM | POA: Diagnosis not present

## 2018-06-08 DIAGNOSIS — R41841 Cognitive communication deficit: Secondary | ICD-10-CM | POA: Diagnosis not present

## 2018-06-08 DIAGNOSIS — R1312 Dysphagia, oropharyngeal phase: Secondary | ICD-10-CM | POA: Diagnosis not present

## 2018-06-08 DIAGNOSIS — K219 Gastro-esophageal reflux disease without esophagitis: Secondary | ICD-10-CM | POA: Diagnosis not present

## 2018-06-09 DIAGNOSIS — R41841 Cognitive communication deficit: Secondary | ICD-10-CM | POA: Diagnosis not present

## 2018-06-09 DIAGNOSIS — R1312 Dysphagia, oropharyngeal phase: Secondary | ICD-10-CM | POA: Diagnosis not present

## 2018-06-09 DIAGNOSIS — K219 Gastro-esophageal reflux disease without esophagitis: Secondary | ICD-10-CM | POA: Diagnosis not present

## 2018-06-09 DIAGNOSIS — I63312 Cerebral infarction due to thrombosis of left middle cerebral artery: Secondary | ICD-10-CM | POA: Diagnosis not present

## 2018-06-11 DIAGNOSIS — K219 Gastro-esophageal reflux disease without esophagitis: Secondary | ICD-10-CM | POA: Diagnosis not present

## 2018-06-11 DIAGNOSIS — R41841 Cognitive communication deficit: Secondary | ICD-10-CM | POA: Diagnosis not present

## 2018-06-11 DIAGNOSIS — I63312 Cerebral infarction due to thrombosis of left middle cerebral artery: Secondary | ICD-10-CM | POA: Diagnosis not present

## 2018-06-11 DIAGNOSIS — R1312 Dysphagia, oropharyngeal phase: Secondary | ICD-10-CM | POA: Diagnosis not present

## 2018-06-12 DIAGNOSIS — R41841 Cognitive communication deficit: Secondary | ICD-10-CM | POA: Diagnosis not present

## 2018-06-12 DIAGNOSIS — K219 Gastro-esophageal reflux disease without esophagitis: Secondary | ICD-10-CM | POA: Diagnosis not present

## 2018-06-12 DIAGNOSIS — R1312 Dysphagia, oropharyngeal phase: Secondary | ICD-10-CM | POA: Diagnosis not present

## 2018-06-12 DIAGNOSIS — I63312 Cerebral infarction due to thrombosis of left middle cerebral artery: Secondary | ICD-10-CM | POA: Diagnosis not present

## 2018-06-13 DIAGNOSIS — I63312 Cerebral infarction due to thrombosis of left middle cerebral artery: Secondary | ICD-10-CM | POA: Diagnosis not present

## 2018-06-13 DIAGNOSIS — K5900 Constipation, unspecified: Secondary | ICD-10-CM | POA: Diagnosis not present

## 2018-06-13 DIAGNOSIS — M6281 Muscle weakness (generalized): Secondary | ICD-10-CM | POA: Diagnosis not present

## 2018-06-13 DIAGNOSIS — I69354 Hemiplegia and hemiparesis following cerebral infarction affecting left non-dominant side: Secondary | ICD-10-CM | POA: Diagnosis not present

## 2018-06-13 DIAGNOSIS — R1312 Dysphagia, oropharyngeal phase: Secondary | ICD-10-CM | POA: Diagnosis not present

## 2018-06-13 DIAGNOSIS — Z85118 Personal history of other malignant neoplasm of bronchus and lung: Secondary | ICD-10-CM | POA: Diagnosis not present

## 2018-06-13 DIAGNOSIS — R41841 Cognitive communication deficit: Secondary | ICD-10-CM | POA: Diagnosis not present

## 2018-06-13 DIAGNOSIS — I4891 Unspecified atrial fibrillation: Secondary | ICD-10-CM | POA: Diagnosis not present

## 2018-06-13 DIAGNOSIS — I10 Essential (primary) hypertension: Secondary | ICD-10-CM | POA: Diagnosis not present

## 2018-06-13 DIAGNOSIS — J449 Chronic obstructive pulmonary disease, unspecified: Secondary | ICD-10-CM | POA: Diagnosis not present

## 2018-06-13 DIAGNOSIS — K219 Gastro-esophageal reflux disease without esophagitis: Secondary | ICD-10-CM | POA: Diagnosis not present

## 2018-06-13 DIAGNOSIS — H10401 Unspecified chronic conjunctivitis, right eye: Secondary | ICD-10-CM | POA: Diagnosis not present

## 2018-06-13 DIAGNOSIS — R1310 Dysphagia, unspecified: Secondary | ICD-10-CM | POA: Diagnosis not present

## 2018-06-13 DIAGNOSIS — E785 Hyperlipidemia, unspecified: Secondary | ICD-10-CM | POA: Diagnosis not present

## 2018-06-13 DIAGNOSIS — I639 Cerebral infarction, unspecified: Secondary | ICD-10-CM | POA: Diagnosis not present

## 2018-06-14 DIAGNOSIS — R41841 Cognitive communication deficit: Secondary | ICD-10-CM | POA: Diagnosis not present

## 2018-06-14 DIAGNOSIS — I63312 Cerebral infarction due to thrombosis of left middle cerebral artery: Secondary | ICD-10-CM | POA: Diagnosis not present

## 2018-06-14 DIAGNOSIS — R1312 Dysphagia, oropharyngeal phase: Secondary | ICD-10-CM | POA: Diagnosis not present

## 2018-06-14 DIAGNOSIS — K219 Gastro-esophageal reflux disease without esophagitis: Secondary | ICD-10-CM | POA: Diagnosis not present

## 2018-06-15 DIAGNOSIS — I63312 Cerebral infarction due to thrombosis of left middle cerebral artery: Secondary | ICD-10-CM | POA: Diagnosis not present

## 2018-06-15 DIAGNOSIS — K219 Gastro-esophageal reflux disease without esophagitis: Secondary | ICD-10-CM | POA: Diagnosis not present

## 2018-06-15 DIAGNOSIS — R1312 Dysphagia, oropharyngeal phase: Secondary | ICD-10-CM | POA: Diagnosis not present

## 2018-06-15 DIAGNOSIS — R41841 Cognitive communication deficit: Secondary | ICD-10-CM | POA: Diagnosis not present

## 2018-06-16 DIAGNOSIS — R41841 Cognitive communication deficit: Secondary | ICD-10-CM | POA: Diagnosis not present

## 2018-06-16 DIAGNOSIS — I63312 Cerebral infarction due to thrombosis of left middle cerebral artery: Secondary | ICD-10-CM | POA: Diagnosis not present

## 2018-06-16 DIAGNOSIS — R1312 Dysphagia, oropharyngeal phase: Secondary | ICD-10-CM | POA: Diagnosis not present

## 2018-06-16 DIAGNOSIS — K219 Gastro-esophageal reflux disease without esophagitis: Secondary | ICD-10-CM | POA: Diagnosis not present

## 2018-06-19 DIAGNOSIS — R41841 Cognitive communication deficit: Secondary | ICD-10-CM | POA: Diagnosis not present

## 2018-06-19 DIAGNOSIS — R1312 Dysphagia, oropharyngeal phase: Secondary | ICD-10-CM | POA: Diagnosis not present

## 2018-06-19 DIAGNOSIS — K219 Gastro-esophageal reflux disease without esophagitis: Secondary | ICD-10-CM | POA: Diagnosis not present

## 2018-06-19 DIAGNOSIS — I63312 Cerebral infarction due to thrombosis of left middle cerebral artery: Secondary | ICD-10-CM | POA: Diagnosis not present

## 2018-06-20 DIAGNOSIS — R1312 Dysphagia, oropharyngeal phase: Secondary | ICD-10-CM | POA: Diagnosis not present

## 2018-06-20 DIAGNOSIS — I63312 Cerebral infarction due to thrombosis of left middle cerebral artery: Secondary | ICD-10-CM | POA: Diagnosis not present

## 2018-06-20 DIAGNOSIS — K219 Gastro-esophageal reflux disease without esophagitis: Secondary | ICD-10-CM | POA: Diagnosis not present

## 2018-06-20 DIAGNOSIS — R41841 Cognitive communication deficit: Secondary | ICD-10-CM | POA: Diagnosis not present

## 2018-06-21 DIAGNOSIS — H02052 Trichiasis without entropian right lower eyelid: Secondary | ICD-10-CM | POA: Diagnosis not present

## 2018-06-21 DIAGNOSIS — R41841 Cognitive communication deficit: Secondary | ICD-10-CM | POA: Diagnosis not present

## 2018-06-21 DIAGNOSIS — H02032 Senile entropion of right lower eyelid: Secondary | ICD-10-CM | POA: Diagnosis not present

## 2018-06-21 DIAGNOSIS — H02002 Unspecified entropion of right lower eyelid: Secondary | ICD-10-CM | POA: Diagnosis not present

## 2018-06-21 DIAGNOSIS — K219 Gastro-esophageal reflux disease without esophagitis: Secondary | ICD-10-CM | POA: Diagnosis not present

## 2018-06-21 DIAGNOSIS — H02413 Mechanical ptosis of bilateral eyelids: Secondary | ICD-10-CM | POA: Diagnosis not present

## 2018-06-21 DIAGNOSIS — I63312 Cerebral infarction due to thrombosis of left middle cerebral artery: Secondary | ICD-10-CM | POA: Diagnosis not present

## 2018-06-21 DIAGNOSIS — L720 Epidermal cyst: Secondary | ICD-10-CM | POA: Diagnosis not present

## 2018-06-21 DIAGNOSIS — R1312 Dysphagia, oropharyngeal phase: Secondary | ICD-10-CM | POA: Diagnosis not present

## 2018-06-21 DIAGNOSIS — H02532 Eyelid retraction right lower eyelid: Secondary | ICD-10-CM | POA: Diagnosis not present

## 2018-06-22 DIAGNOSIS — I63312 Cerebral infarction due to thrombosis of left middle cerebral artery: Secondary | ICD-10-CM | POA: Diagnosis not present

## 2018-06-22 DIAGNOSIS — K219 Gastro-esophageal reflux disease without esophagitis: Secondary | ICD-10-CM | POA: Diagnosis not present

## 2018-06-22 DIAGNOSIS — R1312 Dysphagia, oropharyngeal phase: Secondary | ICD-10-CM | POA: Diagnosis not present

## 2018-06-22 DIAGNOSIS — R41841 Cognitive communication deficit: Secondary | ICD-10-CM | POA: Diagnosis not present

## 2018-06-23 DIAGNOSIS — R1312 Dysphagia, oropharyngeal phase: Secondary | ICD-10-CM | POA: Diagnosis not present

## 2018-06-23 DIAGNOSIS — R41841 Cognitive communication deficit: Secondary | ICD-10-CM | POA: Diagnosis not present

## 2018-06-23 DIAGNOSIS — I63312 Cerebral infarction due to thrombosis of left middle cerebral artery: Secondary | ICD-10-CM | POA: Diagnosis not present

## 2018-06-23 DIAGNOSIS — K219 Gastro-esophageal reflux disease without esophagitis: Secondary | ICD-10-CM | POA: Diagnosis not present

## 2018-06-26 DIAGNOSIS — K219 Gastro-esophageal reflux disease without esophagitis: Secondary | ICD-10-CM | POA: Diagnosis not present

## 2018-06-26 DIAGNOSIS — R41841 Cognitive communication deficit: Secondary | ICD-10-CM | POA: Diagnosis not present

## 2018-06-26 DIAGNOSIS — R1312 Dysphagia, oropharyngeal phase: Secondary | ICD-10-CM | POA: Diagnosis not present

## 2018-06-26 DIAGNOSIS — I63312 Cerebral infarction due to thrombosis of left middle cerebral artery: Secondary | ICD-10-CM | POA: Diagnosis not present

## 2018-06-27 DIAGNOSIS — R1312 Dysphagia, oropharyngeal phase: Secondary | ICD-10-CM | POA: Diagnosis not present

## 2018-06-27 DIAGNOSIS — I63312 Cerebral infarction due to thrombosis of left middle cerebral artery: Secondary | ICD-10-CM | POA: Diagnosis not present

## 2018-06-27 DIAGNOSIS — K219 Gastro-esophageal reflux disease without esophagitis: Secondary | ICD-10-CM | POA: Diagnosis not present

## 2018-06-27 DIAGNOSIS — R41841 Cognitive communication deficit: Secondary | ICD-10-CM | POA: Diagnosis not present

## 2018-06-28 DIAGNOSIS — K219 Gastro-esophageal reflux disease without esophagitis: Secondary | ICD-10-CM | POA: Diagnosis not present

## 2018-06-28 DIAGNOSIS — I63312 Cerebral infarction due to thrombosis of left middle cerebral artery: Secondary | ICD-10-CM | POA: Diagnosis not present

## 2018-06-28 DIAGNOSIS — R1312 Dysphagia, oropharyngeal phase: Secondary | ICD-10-CM | POA: Diagnosis not present

## 2018-06-28 DIAGNOSIS — R41841 Cognitive communication deficit: Secondary | ICD-10-CM | POA: Diagnosis not present

## 2018-06-29 DIAGNOSIS — K219 Gastro-esophageal reflux disease without esophagitis: Secondary | ICD-10-CM | POA: Diagnosis not present

## 2018-06-29 DIAGNOSIS — I63312 Cerebral infarction due to thrombosis of left middle cerebral artery: Secondary | ICD-10-CM | POA: Diagnosis not present

## 2018-06-29 DIAGNOSIS — D649 Anemia, unspecified: Secondary | ICD-10-CM | POA: Diagnosis not present

## 2018-06-29 DIAGNOSIS — R1312 Dysphagia, oropharyngeal phase: Secondary | ICD-10-CM | POA: Diagnosis not present

## 2018-06-29 DIAGNOSIS — R319 Hematuria, unspecified: Secondary | ICD-10-CM | POA: Diagnosis not present

## 2018-06-29 DIAGNOSIS — N390 Urinary tract infection, site not specified: Secondary | ICD-10-CM | POA: Diagnosis not present

## 2018-06-29 DIAGNOSIS — R41841 Cognitive communication deficit: Secondary | ICD-10-CM | POA: Diagnosis not present

## 2018-06-29 NOTE — Progress Notes (Addendum)
Subjective:   Kayla Kent is a 84 y.o. female who presents for Medicare Annual (Subsequent) preventive examination.  Review of Systems:  No ROS.  Medicare Wellness Visit. Additional risk factors are reflected in the social history.  Cardiac Risk Factors include: advanced age (>47mn, >>13women) Sleep patterns: gets up 1-2 times nightly to void and sleeps 7-8 hours nightly.    Home Safety/Smoke Alarms: Feels safe in home. Smoke alarms in place.  Living environment; residence and Firearm Safety: assisted living, no firearms[facility]. Seat Belt Safety/Bike Helmet: Wears seat belt.     Objective:     Vitals: BP 102/62   Pulse 64   Resp 16   Ht _0  (1.702 m)   Wt 107 lb (48.5 kg)   SpO2 98%   BMI 16.76 kg/m   Body mass index is 16.76 kg/m.  Advanced Directives 06/30/2018 09/28/2017 09/24/2017 09/23/2017 09/16/2017 09/09/2017 09/09/2017  Does Patient Have a Medical Advance Directive? Yes Yes Yes - Yes Yes -  Type of Advance Directive H[place]Living will Healthcare Power of [place]of AC[place]of [place]Living will [place]Living will H[place]ass[place]Living will  Does patient want to make changes to medical advance directive? - No - Patient declined No - Patient declined - No - Patient declined No - Patient declined -  Copy of HCulverin Chart? No - copy requested Yes Yes - Yes Yes Yes  Would patient like information on creating a medical advance directive? - - - - - - -    Tobacco Social History   Tobacco Use  Smoking Status Former Smoker  . Packs/day: 0.25  . Years: 38.00  . Pack years: 9.50  . Start date: 02/15/1952  . Last attempt to quit: 10/18/1993  . Years since quitting: 24.7  Smokeless Tobacco Never Used     Counseling given: Not Answered  Past Medical History:  Diagnosis  Date  . Allergic rhinitis   . Anxiety   . CKD (chronic kidney disease) stage 3, GFR 30-59 ml/min (HCC)   . Emphysema of lung ([place]   . HLD (hyperlipidemia)   . HTN (hypertension)   . Intolerance of drug    orthostatic  . Paroxysmal supraventricular tachycardia ([place]   . PVD (peripheral vascular disease) ([place]   . Seizure ([place]   . Syncope and collapse   . Uterine prolapse without mention of vaginal wall prolapse    Past Surgical History:  Procedure Laterality Date  . CARDIOVERSION N/A 11/05/2014   Procedure: CARDIOVERSION;  Surgeon: MCandee Furbish MD;  [facility];  Service: Cardiovascular;  Laterality: N/A;  . cataract surgery  08/2015  . CHOLECYSTECTOMY    . corrective eye surgery     as a child  . ESOPHAGOGASTRODUODENOSCOPY N/A 09/09/2017   Procedure: ESOPHAGOGASTRODUODENOSCOPY (EGD);  Surgeon: NMauri Pole MD;  [facility];  Service: Endoscopy;  Laterality: N/A;  . INTRAMEDULLARY (IM) NAIL INTERTROCHANTERIC Left 11/21/2016   Procedure: INTRAMEDULLARY (IM) NAIL INTERTROCHANTRIC HEMI;  Surgeon: MNewt Minion MD;  [place]  Service: Orthopedics;  Laterality: Left;  . IR GASTROSTOMY TUBE MOD SED  09/26/2017  . IR PERCUTANEOUS ART THROMBECTOMY/INFUSION INTRACRANIAL INC DIAG ANGIO  09/11/2017  . IVD removed    . OPEN REDUCTION INTERNAL FIXATION (ORIF) DISTAL RADIAL FRACTURE Right 08/29/2014   Procedure: OPEN REDUCTION INTERNAL FIXATION (ORIF) DISTAL RADIAL FRACTURE;  Surgeon: Marianna Payment, MD;  Location: [place];  Service: Orthopedics;  Laterality: Right;  . RADIOLOGY WITH ANESTHESIA N/A 09/11/2017   Procedure: RADIOLOGY WITH ANESTHESIA;  Surgeon: Luanne Bras, MD;  Location: [place];  Service: Radiology;  Laterality: N/A;  . RF ablation PSVT     summer '10  . stress cardiolite  08/05/93  . TONSILLECTOMY    . TOTAL HIP ARTHROPLASTY Right 08/29/2014   Procedure: Right Hip Hemi Arthroplasty;  Surgeon: Marianna Payment, MD;  Location: [place];  Service: Orthopedics;  Laterality: Right;  Hip procedure 1st wants Peg Board, [employer], [place].    Family History  Problem Relation Age of Onset  . Mental illness Father        suicide  . Arthritis Father   . Hyperlipidemia Sister   . Hypertension Sister   . Heart disease Brother        CAD/MI  . Hypertension Brother   . Hypertension Unknown        family hx  . Colon cancer Neg Hx   . Breast cancer Neg Hx   . Diabetes Neg Hx   . Stroke Neg Hx   . Cancer Neg Hx   . Lung disease Neg Hx    Social History   Socioeconomic History  . Marital status: Widowed    Spouse name: Not on file  . Number of children: 2  . Years of education: 31  . Highest education level: Not on file  Occupational History  . Occupation: retired Radiation protection practitioner  . Financial resource strain: Not hard at all  . Food insecurity:    Worry: Never true    Inability: Never true  . Transportation needs:    Medical: No    Non-medical: No  Tobacco Use  . Smoking status: Former Smoker    Packs/day: 0.25    Years: 38.00    Pack years: 9.50    Start date: 02/15/1952    Last attempt to quit: 10/18/1993    Years since quitting: 24.7  . Smokeless tobacco: Never Used  Substance and Sexual Activity  . Alcohol use: No    Alcohol/week: 0.0 standard drinks  . Drug use: No  . Sexual activity: Never  Lifestyle  . Physical activity:    Days per week: 4 days    Minutes per session: 40 min  . Stress: Only a little  Relationships  . Social connections:    Talks on phone: More than three times a week    Gets together: More than three times a week    Attends religious service: 1 to 4 times per year    Active member of club or organization: Yes    Attends meetings of clubs or organizations: More than 4 times per year    Relationship status: Widowed  Other Topics Concern  . Not on file  Social History Narrative   [employer], [employer]-BA, [employer] M[employer]-early childhood. Married '55 -33 years.  1  son - 46; 1 daughter - 81; 3 grandchildren . Lives alone. ACP - discussed and provided packet on end of life care (Feb '13)   Patient is now widowed.   Patient is right-handed.   Patient drinks tea daily.       Pulmonary (04/26/17):   Originally from [facility]. Previously was a Pharmacist, hospital. No pets currently. No mold exposure. No bird exposure.     Outpatient Encounter Medications as of 06/30/2018  Medication Sig  . acetaminophen (TYLENOL)  160 MG/5ML solution Place 15.6 mLs (500 mg total) into feeding tube every 4 (four) hours as needed for mild pain or headache. (Patient taking differently: Take 500 mg by mouth every 4 (four) hours as needed for mild pain or headache. )  . alum & mag hydroxide-simeth (MAALOX/MYLANTA) 200-200-20 MG/5ML suspension Place 30 mLs into feeding tube every 4 (four) hours as needed for indigestion or heartburn. (Patient taking differently: Take 30 mLs by mouth every 4 (four) hours as needed for indigestion or heartburn. )  . apixaban (ELIQUIS) 2.5 MG TABS tablet Place 1 tablet (2.5 mg total) into feeding tube 2 (two) times daily. (Patient taking differently: Take 2.5 mg by mouth 2 (two) times daily. )  . aspirin 81 MG chewable tablet Place 1 tablet (81 mg total) into feeding tube daily.  Marland Kitchen atorvastatin (LIPITOR) 10 MG tablet   . bethanechol (URECHOLINE) 10 MG tablet Take 1 tablet (10 mg total) by mouth 3 (three) times daily.  . bisacodyl (DULCOLAX) 10 MG suppository Place 1 suppository (10 mg total) rectally daily as needed for moderate constipation.  . chlorhexidine gluconate, MEDLINE KIT, (PERIDEX) 0.12 % solution 15 mLs by Mouth Rinse route 2 (two) times daily.  Marland Kitchen escitalopram (LEXAPRO) 5 MG tablet Take 1 tablet (5 mg total) by mouth daily.  Marland Kitchen ipratropium-albuterol (DUONEB) 0.5-2.5 (3) MG/3ML SOLN Take 3 mLs by nebulization every 4 (four) hours as needed.  . loratadine (CLARITIN) 10 MG tablet Take 10 mg by mouth daily.  . Melatonin 3 MG TABS Take 0.5 tablets (1.5 mg  total) by mouth at bedtime. (Patient taking differently: Take 3 mg by mouth at bedtime. )  . metoprolol tartrate (LOPRESSOR) 25 mg/10 mL SUSP Place 10 mLs (25 mg total) into feeding tube 2 (two) times daily.  Marland Kitchen mouth rinse LIQD solution 15 mLs by Mouth Rinse route 2 times daily at 12 noon and 4 pm.  . ondansetron (ZOFRAN) 4 MG tablet Take 1 tablet (4 mg total) by mouth every 6 (six) hours as needed for nausea or vomiting.  . polyethylene glycol (MIRALAX / GLYCOLAX) packet Place 17 g into feeding tube 2 (two) times daily with breakfast and lunch.  . ranitidine (ZANTAC) 150 MG tablet Take 150 mg by mouth daily.  . simethicone (MYLICON) 40 JJ/0.0XF drops Place 0.6 mLs (40 mg total) into feeding tube 4 (four) times daily as needed for flatulence.  . sodium chloride (OCEAN) 0.65 % SOLN nasal spray Place 1 spray into both nostrils as needed for congestion.  . sodium phosphate (FLEET) 7-19 GM/118ML ENEM Place 133 mLs (1 enema total) rectally daily as needed for severe constipation.  Marland Kitchen trimethoprim-polymyxin b (POLYTRIM) ophthalmic solution Place 1 drop into the right eye 4 (four) times daily.  . [DISCONTINUED] Hydrocortisone (GERHARDT'S BUTT CREAM) CREA Apply 1 application topically 4 (four) times daily.  . [DISCONTINUED] collagenase (SANTYL) ointment Apply topically daily. To sacral wound, cover with moist 2 X 2 and foam dressing. Change daily and more frequently if soiled (Patient not taking: Reported on 06/30/2018)  . [DISCONTINUED] Water For Irrigation, Sterile (FREE WATER) SOLN Place 200 mLs into feeding tube every 8 (eight) hours.   No facility-administered encounter medications on file as of 06/30/2018.     Activities of Daily Living In your present state of health, do you have any difficulty performing the following activities: 06/30/2018 09/28/2017  Hearing? N N  Vision? N Y  Difficulty concentrating or making decisions? N Y  Walking or climbing stairs? Y N  Dressing or  bathing? Y Y  Doing  errands, shopping? Y N  Preparing Food and eating ? Y -  Using the Toilet? Y -  In the past six months, have you accidently leaked urine? Y -  Do you have problems with loss of bowel control? Y -  Managing your Medications? Y -  Managing your Finances? Y -  Housekeeping or managing your Housekeeping? Y -  Some recent data might be hidden    Patient Care Team: Biagio Borg, MD as PCP - General (Internal Medicine) Fay Records, MD as Consulting Physician (Cardiology) Leandrew Koyanagi, MD as Attending Physician (Orthopedic Surgery) Dohmeier, Asencion Partridge, MD as Consulting Physician (Neurology) Nat Christen, MD as Attending Physician (Optometry) [facility] (Dentistry)    Assessment:   This is a routine wellness examination for Kayla Kent. Physical assessment deferred to PCP.   Exercise Activities and Dietary recommendations Current Exercise Habits: Structured exercise class, Type of exercise: walking;stretching(chair exercise), Time (Minutes): 35, Frequency (Times/Week): 4, Weekly Exercise (Minutes/Week): 140, Intensity: Mild, Exercise limited by: orthopedic condition(s)(CVA residual right side)  Diet (meal preparation, eat out, water intake, caffeinated beverages, dairy products, fruits and vegetables): in general, a "healthy" diet  , well balanced. Reports good appetite and hydration. Maintains pureed diet with thin liquids. G-tube has been removed.   Goals      Patient Stated   . <enter goal here> (pt-stated)     Maintain current health, remain active and keep brain active.       Other   . Patient Stated     Continue to be as active and as independent as possible. Continue to work hard to rehabilitate.        Fall Risk Fall Risk  06/30/2018 06/21/2017 04/11/2017 10/05/2016 05/05/2016  Falls in the past year? Yes Yes Yes No No  Comment - - - - last fall 2015  Number falls in past yr: 2 or more 1 1 - -  Injury with Fall? No Yes Yes - -  Comment - - - - -  Risk  Factor Category  High Fall Risk - High Fall Risk - -  Risk for fall due to : Impaired balance/gait;Impaired mobility Impaired balance/gait History of fall(s) - -  Follow up Falls prevention discussed - Falls evaluation completed;Education provided;Falls prevention discussed - -  Comment [facility] at [facility] full care unit - - - -    Depression Screen PHQ 2/9 Scores 06/30/2018 04/11/2017 10/05/2016 04/22/2015  PHQ - 2 Score 0 0 0 0     Cognitive Function MMSE - Mini Mental State Exam 06/30/2018  Not completed: Unable to complete        Immunization History  Administered Date(s) Administered  . Influenza Whole 08/02/2008, 07/07/2009, 06/30/2010  . Influenza, High Dose Seasonal PF 07/13/2014  . Influenza,inj,Quad PF,6+ Mos 07/21/2017  . Influenza-Unspecified 07/21/2012, 07/19/2015, 08/03/2016  . Pneumococcal Conjugate-13 02/08/2014  . Pneumococcal Polysaccharide-23 07/07/2009  . Td 12/17/2008   Screening Tests Health Maintenance  Topic Date Due  . DEXA SCAN  02/15/1999  . INFLUENZA VACCINE  05/18/2018  . TETANUS/TDAP  12/18/2018  . PNA vac Low Risk Adult  Completed      Plan:     Continue to eat heart healthy diet (full of fruits, vegetables, whole grains, lean protein, water--limit salt, fat, and sugar intake) and increase physical activity as tolerated.  Continue doing brain stimulating activities (puzzles, reading, adult coloring books, staying active) to keep memory sharp.   I have personally reviewed  and noted the following in the patient's chart:   . Medical and social history . Use of alcohol, tobacco or illicit drugs  . Current medications and supplements . Functional ability and status . Nutritional status . Physical activity . Advanced directives . List of other physicians . Vitals . Screenings to include cognitive, depression, and falls . Referrals and appointments  In addition, I have reviewed and discussed with patient certain preventive protocols,  quality metrics, and best practice recommendations. A written personalized care plan for preventive services as well as general preventive health recommendations were provided to patient.     Michiel Cowboy, RN  06/30/2018    /Medical screening examination/treatment/procedure(s) were performed by non-physician practitioner and as supervising physician I was immediately available for consultation/collaboration. I agree with above. Cathlean Cower, MD

## 2018-06-30 ENCOUNTER — Ambulatory Visit (INDEPENDENT_AMBULATORY_CARE_PROVIDER_SITE_OTHER): Payer: Medicare Other | Admitting: *Deleted

## 2018-06-30 VITALS — BP 102/62 | HR 64 | Resp 16 | Ht 67.0 in | Wt 107.0 lb

## 2018-06-30 DIAGNOSIS — Z0000 Encounter for general adult medical examination without abnormal findings: Secondary | ICD-10-CM | POA: Diagnosis not present

## 2018-06-30 DIAGNOSIS — I63312 Cerebral infarction due to thrombosis of left middle cerebral artery: Secondary | ICD-10-CM | POA: Diagnosis not present

## 2018-06-30 DIAGNOSIS — R1312 Dysphagia, oropharyngeal phase: Secondary | ICD-10-CM | POA: Diagnosis not present

## 2018-06-30 DIAGNOSIS — I639 Cerebral infarction, unspecified: Secondary | ICD-10-CM | POA: Diagnosis not present

## 2018-06-30 DIAGNOSIS — K219 Gastro-esophageal reflux disease without esophagitis: Secondary | ICD-10-CM | POA: Diagnosis not present

## 2018-06-30 DIAGNOSIS — I4891 Unspecified atrial fibrillation: Secondary | ICD-10-CM | POA: Diagnosis not present

## 2018-06-30 DIAGNOSIS — R41841 Cognitive communication deficit: Secondary | ICD-10-CM | POA: Diagnosis not present

## 2018-06-30 DIAGNOSIS — N939 Abnormal uterine and vaginal bleeding, unspecified: Secondary | ICD-10-CM | POA: Diagnosis not present

## 2018-07-03 DIAGNOSIS — I63312 Cerebral infarction due to thrombosis of left middle cerebral artery: Secondary | ICD-10-CM | POA: Diagnosis not present

## 2018-07-03 DIAGNOSIS — R1312 Dysphagia, oropharyngeal phase: Secondary | ICD-10-CM | POA: Diagnosis not present

## 2018-07-03 DIAGNOSIS — K219 Gastro-esophageal reflux disease without esophagitis: Secondary | ICD-10-CM | POA: Diagnosis not present

## 2018-07-03 DIAGNOSIS — R41841 Cognitive communication deficit: Secondary | ICD-10-CM | POA: Diagnosis not present

## 2018-07-04 DIAGNOSIS — R634 Abnormal weight loss: Secondary | ICD-10-CM | POA: Diagnosis not present

## 2018-07-04 DIAGNOSIS — E785 Hyperlipidemia, unspecified: Secondary | ICD-10-CM | POA: Diagnosis not present

## 2018-07-04 DIAGNOSIS — I10 Essential (primary) hypertension: Secondary | ICD-10-CM | POA: Diagnosis not present

## 2018-07-05 DIAGNOSIS — F419 Anxiety disorder, unspecified: Secondary | ICD-10-CM | POA: Diagnosis not present

## 2018-07-05 DIAGNOSIS — G4700 Insomnia, unspecified: Secondary | ICD-10-CM | POA: Diagnosis not present

## 2018-07-05 DIAGNOSIS — F331 Major depressive disorder, recurrent, moderate: Secondary | ICD-10-CM | POA: Diagnosis not present

## 2018-07-06 DIAGNOSIS — N950 Postmenopausal bleeding: Secondary | ICD-10-CM | POA: Diagnosis not present

## 2018-07-20 DIAGNOSIS — I10 Essential (primary) hypertension: Secondary | ICD-10-CM | POA: Diagnosis not present

## 2018-07-20 DIAGNOSIS — D649 Anemia, unspecified: Secondary | ICD-10-CM | POA: Diagnosis not present

## 2018-07-21 DIAGNOSIS — I10 Essential (primary) hypertension: Secondary | ICD-10-CM | POA: Diagnosis not present

## 2018-07-21 DIAGNOSIS — Z79899 Other long term (current) drug therapy: Secondary | ICD-10-CM | POA: Diagnosis not present

## 2018-07-21 DIAGNOSIS — J449 Chronic obstructive pulmonary disease, unspecified: Secondary | ICD-10-CM | POA: Diagnosis not present

## 2018-07-21 DIAGNOSIS — M6281 Muscle weakness (generalized): Secondary | ICD-10-CM | POA: Diagnosis not present

## 2018-07-21 DIAGNOSIS — L22 Diaper dermatitis: Secondary | ICD-10-CM | POA: Diagnosis not present

## 2018-07-21 DIAGNOSIS — E785 Hyperlipidemia, unspecified: Secondary | ICD-10-CM | POA: Diagnosis not present

## 2018-07-21 DIAGNOSIS — R319 Hematuria, unspecified: Secondary | ICD-10-CM | POA: Diagnosis not present

## 2018-07-21 DIAGNOSIS — Z85118 Personal history of other malignant neoplasm of bronchus and lung: Secondary | ICD-10-CM | POA: Diagnosis not present

## 2018-07-21 DIAGNOSIS — R2689 Other abnormalities of gait and mobility: Secondary | ICD-10-CM | POA: Diagnosis not present

## 2018-07-21 DIAGNOSIS — N390 Urinary tract infection, site not specified: Secondary | ICD-10-CM | POA: Diagnosis not present

## 2018-07-21 DIAGNOSIS — K5900 Constipation, unspecified: Secondary | ICD-10-CM | POA: Diagnosis not present

## 2018-07-21 DIAGNOSIS — I639 Cerebral infarction, unspecified: Secondary | ICD-10-CM | POA: Diagnosis not present

## 2018-07-21 DIAGNOSIS — R1310 Dysphagia, unspecified: Secondary | ICD-10-CM | POA: Diagnosis not present

## 2018-07-21 DIAGNOSIS — K219 Gastro-esophageal reflux disease without esophagitis: Secondary | ICD-10-CM | POA: Diagnosis not present

## 2018-07-21 DIAGNOSIS — I69354 Hemiplegia and hemiparesis following cerebral infarction affecting left non-dominant side: Secondary | ICD-10-CM | POA: Diagnosis not present

## 2018-07-24 DIAGNOSIS — N390 Urinary tract infection, site not specified: Secondary | ICD-10-CM | POA: Diagnosis not present

## 2018-08-08 DIAGNOSIS — I63312 Cerebral infarction due to thrombosis of left middle cerebral artery: Secondary | ICD-10-CM | POA: Diagnosis not present

## 2018-08-08 DIAGNOSIS — R1312 Dysphagia, oropharyngeal phase: Secondary | ICD-10-CM | POA: Diagnosis not present

## 2018-08-08 DIAGNOSIS — K219 Gastro-esophageal reflux disease without esophagitis: Secondary | ICD-10-CM | POA: Diagnosis not present

## 2018-08-08 DIAGNOSIS — R41841 Cognitive communication deficit: Secondary | ICD-10-CM | POA: Diagnosis not present

## 2018-08-09 DIAGNOSIS — R41841 Cognitive communication deficit: Secondary | ICD-10-CM | POA: Diagnosis not present

## 2018-08-09 DIAGNOSIS — R1312 Dysphagia, oropharyngeal phase: Secondary | ICD-10-CM | POA: Diagnosis not present

## 2018-08-09 DIAGNOSIS — I63312 Cerebral infarction due to thrombosis of left middle cerebral artery: Secondary | ICD-10-CM | POA: Diagnosis not present

## 2018-08-09 DIAGNOSIS — F419 Anxiety disorder, unspecified: Secondary | ICD-10-CM | POA: Diagnosis not present

## 2018-08-09 DIAGNOSIS — F331 Major depressive disorder, recurrent, moderate: Secondary | ICD-10-CM | POA: Diagnosis not present

## 2018-08-09 DIAGNOSIS — G4700 Insomnia, unspecified: Secondary | ICD-10-CM | POA: Diagnosis not present

## 2018-08-09 DIAGNOSIS — K219 Gastro-esophageal reflux disease without esophagitis: Secondary | ICD-10-CM | POA: Diagnosis not present

## 2018-08-10 DIAGNOSIS — R1312 Dysphagia, oropharyngeal phase: Secondary | ICD-10-CM | POA: Diagnosis not present

## 2018-08-10 DIAGNOSIS — I63312 Cerebral infarction due to thrombosis of left middle cerebral artery: Secondary | ICD-10-CM | POA: Diagnosis not present

## 2018-08-10 DIAGNOSIS — R41841 Cognitive communication deficit: Secondary | ICD-10-CM | POA: Diagnosis not present

## 2018-08-10 DIAGNOSIS — K219 Gastro-esophageal reflux disease without esophagitis: Secondary | ICD-10-CM | POA: Diagnosis not present

## 2018-08-11 DIAGNOSIS — R41841 Cognitive communication deficit: Secondary | ICD-10-CM | POA: Diagnosis not present

## 2018-08-11 DIAGNOSIS — K219 Gastro-esophageal reflux disease without esophagitis: Secondary | ICD-10-CM | POA: Diagnosis not present

## 2018-08-11 DIAGNOSIS — R1312 Dysphagia, oropharyngeal phase: Secondary | ICD-10-CM | POA: Diagnosis not present

## 2018-08-11 DIAGNOSIS — I63312 Cerebral infarction due to thrombosis of left middle cerebral artery: Secondary | ICD-10-CM | POA: Diagnosis not present

## 2018-08-14 DIAGNOSIS — I63312 Cerebral infarction due to thrombosis of left middle cerebral artery: Secondary | ICD-10-CM | POA: Diagnosis not present

## 2018-08-14 DIAGNOSIS — K219 Gastro-esophageal reflux disease without esophagitis: Secondary | ICD-10-CM | POA: Diagnosis not present

## 2018-08-14 DIAGNOSIS — R1312 Dysphagia, oropharyngeal phase: Secondary | ICD-10-CM | POA: Diagnosis not present

## 2018-08-14 DIAGNOSIS — R41841 Cognitive communication deficit: Secondary | ICD-10-CM | POA: Diagnosis not present

## 2018-08-15 ENCOUNTER — Telehealth: Payer: Self-pay

## 2018-08-15 DIAGNOSIS — R41841 Cognitive communication deficit: Secondary | ICD-10-CM | POA: Diagnosis not present

## 2018-08-15 DIAGNOSIS — I63312 Cerebral infarction due to thrombosis of left middle cerebral artery: Secondary | ICD-10-CM | POA: Diagnosis not present

## 2018-08-15 DIAGNOSIS — K219 Gastro-esophageal reflux disease without esophagitis: Secondary | ICD-10-CM | POA: Diagnosis not present

## 2018-08-15 DIAGNOSIS — R1312 Dysphagia, oropharyngeal phase: Secondary | ICD-10-CM | POA: Diagnosis not present

## 2018-08-15 NOTE — Telephone Encounter (Signed)
   Primary Cardiologist: Dorris Carnes, MD  Chart reviewed as part of pre-operative protocol coverage.  Left voice mail to call between pre-op hours.   Pharmacy to review anticoagulation Dr. Harrington Challenger to review ASA  Leanor Kail, PA 08/15/2018, 2:28 PM

## 2018-08-15 NOTE — Telephone Encounter (Signed)
Pt takes Eliquis for afib with CHADS2VASc score of 8 (age x2, sex, HTN, CHF, CAD, TIA). CrCl 17mL/min due to age and low body weight, SCr normal. Pt appropriately on reduced dose of Eliquis. Recommend holding Eliquis for only 24 hours prior to procedure due to elevated cardiac risk including stroke history.  Pre op pool to address ASA clearance.

## 2018-08-15 NOTE — Telephone Encounter (Signed)
   [facility] Pre-operative Risk Assessment    Request for surgical clearance:  1. What type of surgery is being performed? Right lower eyelid entropion retraction and center repair  2. When is this surgery scheduled? 08/24/18   3. What type of clearance is required (medical clearance vs. Pharmacy clearance to hold med vs. Both)? Both  4. Are there any medications that need to be held prior to surgery and how long? ASA, Eliquis  5. Practice name and name of physician performing surgery? Dr. Isidoro Donning... [place]   6. What is your office phone number [phone]    7.   What is your office fax number         [phone]  8.   Anesthesia type (None, local, MAC, general) ?

## 2018-08-16 ENCOUNTER — Telehealth: Payer: Self-pay | Admitting: Internal Medicine

## 2018-08-16 DIAGNOSIS — R1312 Dysphagia, oropharyngeal phase: Secondary | ICD-10-CM | POA: Diagnosis not present

## 2018-08-16 DIAGNOSIS — K219 Gastro-esophageal reflux disease without esophagitis: Secondary | ICD-10-CM | POA: Diagnosis not present

## 2018-08-16 DIAGNOSIS — R41841 Cognitive communication deficit: Secondary | ICD-10-CM | POA: Diagnosis not present

## 2018-08-16 DIAGNOSIS — I63312 Cerebral infarction due to thrombosis of left middle cerebral artery: Secondary | ICD-10-CM | POA: Diagnosis not present

## 2018-08-16 NOTE — Telephone Encounter (Signed)
Patient's daughter called re: pre op clearance, holding eliquis and asa Pt having lower eyelid surgery on 08/24/18. Daughter was contacted yesterday by APP to ask patient assessment questions Daugther would strongly prefer that Dr. Harrington Challenger handle this herself.  Per Jinny Blossom, PharmD on 08/15/18:   Pt takes Eliquis for afib with CHADS2VASc score of 8 (age x2, sex, HTN, CHF, CAD, TIA). CrCl 39mL/min due to age and low body weight, SCr normal. Pt appropriately on reduced dose of Eliquis. Recommend holding Eliquis for only 24 hours prior to procedure due to elevated cardiac risk including stroke history.  Pre op pool to address ASA clearance.     ____________________________________________________________________________________________ Per daughter, Dr. Lorina Rabon said he can do the procedure without stopping eliquis and given patient's history dtr "would strongly prefer" that she stay on eliquis.  Also, she is asking if Dr. Harrington Challenger will make this decision and not go through "people who do not know her mother and her history".  She is aware that this is our policy.  Aware I will forward to Dr. Harrington Challenger.   I called Dr. Linton Rump office and left message for them to fax [place] something stating holding eliquis/asa is not necessary.  Surgery is scheduled 08/24/18. Pt is at [facility] and they will need to know about any meds that would be held. [phone] (care and wellness center).

## 2018-08-16 NOTE — Telephone Encounter (Signed)
I would recomm stopping aspirin. If procedure can be done with pt on Eliquis I would do that given history of CVA

## 2018-08-16 NOTE — Telephone Encounter (Signed)
Left message at [facility] and [facility] [phone] for someone to call bck to find out if pt is still taking aspirin 81 mg.

## 2018-08-16 NOTE — Telephone Encounter (Signed)
It is OK to stop aspirin. M Supple gave instructions for Eliquis holding    I agree if surgeon feels he/she needs to hold    But, if surgeon says procedure can be done on Eliquis then would continue

## 2018-08-16 NOTE — Telephone Encounter (Signed)
   Primary Cardiologist:Paula Harrington Challenger, MD  Chart reviewed as part of pre-operative protocol coverage.   Patient has a hx of AFib, HTN, CKD (Cr Cl 39 mL/min).   Her Apixaban was held during an admission in 08/2017 with suspected GI bleed.  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  She spent several weeks in inpatient rehab.  She was last seen by Dr. Harrington Challenger in 02/2018.    I contacted the patient's daughter (DPR on file) 08/16/2018.  The patient is currently in skilled nursing at [facility].  Her daughter notes the patient has not complained of chest pain or shortness of breath.    Her daughter thinks that the ophthalmologist can do the procedure while she is on Apixaban.  She has placed a call to his office already.  Upon chart review, it is not clear why she is on ASA.  She does not likely need to continue this.  Her daughter prefers that Dr. Harrington Challenger handle the preoperative risk assessment.  Dr. Harrington Challenger' nurse has already spoken to the patient's daughter [name]) today.    PLAN:  1. Please contact [place] to confirm whether the patient is taking ASA or not. 2. We are awaiting notification from the surgeon on whether or not the Eliquis needs to be held for the procedure. 3. Please contact the daughter [name]) with any recommendations.  4. Dr. Harrington Challenger will need to weigh in on whether or not ASA can be held (if she is still taking it).  I will route this note to Rodman Key, RN and Dr. Dorris Carnes.  The note will be removed from the preop pool.   Richardson Dopp, PA-C  08/16/2018, 12:11 PM

## 2018-08-16 NOTE — Telephone Encounter (Signed)
New Message        Patient's daughter is calling for a return call from Dr. Alan Ripper nurse or the doc her self. She feels like Dr. Harrington Challenger is the only one that should be making decision's about her moms well being since she is her doctor. Pls call and advise

## 2018-08-17 ENCOUNTER — Telehealth: Payer: Self-pay | Admitting: *Deleted

## 2018-08-17 DIAGNOSIS — R41841 Cognitive communication deficit: Secondary | ICD-10-CM | POA: Diagnosis not present

## 2018-08-17 DIAGNOSIS — I63312 Cerebral infarction due to thrombosis of left middle cerebral artery: Secondary | ICD-10-CM | POA: Diagnosis not present

## 2018-08-17 DIAGNOSIS — R1312 Dysphagia, oropharyngeal phase: Secondary | ICD-10-CM | POA: Diagnosis not present

## 2018-08-17 DIAGNOSIS — K219 Gastro-esophageal reflux disease without esophagitis: Secondary | ICD-10-CM | POA: Diagnosis not present

## 2018-08-17 NOTE — Telephone Encounter (Signed)
   [facility] Pre-operative Risk Assessment    Request for surgical clearance:  1. What type of surgery is being performed? Right lower eyelid entropion retraction and center repair  2. When is this surgery scheduled? 08/24/18   3. What type of clearance is required (medical clearance vs. Pharmacy clearance to hold med vs. Both)? Both  4. Are there any medications that need to be held prior to surgery and how long? ASA, Eliquis  5. Practice name and name of physician performing surgery? Dr. Isidoro Donning... [facility]   6. What is your office phone number [phone]    7.   What is your office fax number         [phone]  8.   Anesthesia type (None, local, MAC, general) ?    Fay Records, MD  to Me       3:29 PM  Note    Patient is OK to proceed with eye surgery from cardiac / neurologic standpoint.   If possible keep on Eliquis If not, hold for 24 hours   Resume after

## 2018-08-17 NOTE — Telephone Encounter (Signed)
Still need medical clearance from Dr Harrington Challenger for pt to proceed with surgery.

## 2018-08-17 NOTE — Telephone Encounter (Signed)
Spoke with daughter who is aware of Dr Harrington Challenger' recommendation to discontinue ASA if pt is still taking it and also to continue Eliquis throughout surgery if possible d/t HX of CVA. Daughter is going to verify the ASA with [facility].  She requests clearance information be sent to Dr Isidoro Donning

## 2018-08-17 NOTE — Telephone Encounter (Signed)
New message:      Pt's daughter is calling and would like a return call from [place]. I have advised her that she is not in the office today and another nurse would be giving her a call back.

## 2018-08-17 NOTE — Telephone Encounter (Signed)
Patient is OK to proceed with eye surgery from cardiac / neurologic standpoint.   If possible keep on Eliquis If not, hold for 24 hours   Resume after

## 2018-08-17 NOTE — Telephone Encounter (Signed)
Completed new telephone note for surgical clearance and faxed it to number listed for Dr Isidoro Donning. Also,daughter called back after her meeting with the case worker at [facility] to confirm pt is not presently taking  ASA.  They will bring a new list of her medications to her next appt.

## 2018-08-18 DIAGNOSIS — R1312 Dysphagia, oropharyngeal phase: Secondary | ICD-10-CM | POA: Diagnosis not present

## 2018-08-18 DIAGNOSIS — I63312 Cerebral infarction due to thrombosis of left middle cerebral artery: Secondary | ICD-10-CM | POA: Diagnosis not present

## 2018-08-18 DIAGNOSIS — K219 Gastro-esophageal reflux disease without esophagitis: Secondary | ICD-10-CM | POA: Diagnosis not present

## 2018-08-18 DIAGNOSIS — R41841 Cognitive communication deficit: Secondary | ICD-10-CM | POA: Diagnosis not present

## 2018-08-18 HISTORY — PX: EYE SURGERY: SHX253

## 2018-08-21 DIAGNOSIS — R1312 Dysphagia, oropharyngeal phase: Secondary | ICD-10-CM | POA: Diagnosis not present

## 2018-08-21 DIAGNOSIS — K219 Gastro-esophageal reflux disease without esophagitis: Secondary | ICD-10-CM | POA: Diagnosis not present

## 2018-08-21 DIAGNOSIS — R41841 Cognitive communication deficit: Secondary | ICD-10-CM | POA: Diagnosis not present

## 2018-08-21 DIAGNOSIS — I63312 Cerebral infarction due to thrombosis of left middle cerebral artery: Secondary | ICD-10-CM | POA: Diagnosis not present

## 2018-08-22 DIAGNOSIS — I63312 Cerebral infarction due to thrombosis of left middle cerebral artery: Secondary | ICD-10-CM | POA: Diagnosis not present

## 2018-08-22 DIAGNOSIS — K219 Gastro-esophageal reflux disease without esophagitis: Secondary | ICD-10-CM | POA: Diagnosis not present

## 2018-08-22 DIAGNOSIS — R41841 Cognitive communication deficit: Secondary | ICD-10-CM | POA: Diagnosis not present

## 2018-08-22 DIAGNOSIS — R1312 Dysphagia, oropharyngeal phase: Secondary | ICD-10-CM | POA: Diagnosis not present

## 2018-08-23 DIAGNOSIS — I63312 Cerebral infarction due to thrombosis of left middle cerebral artery: Secondary | ICD-10-CM | POA: Diagnosis not present

## 2018-08-23 DIAGNOSIS — K219 Gastro-esophageal reflux disease without esophagitis: Secondary | ICD-10-CM | POA: Diagnosis not present

## 2018-08-23 DIAGNOSIS — R1312 Dysphagia, oropharyngeal phase: Secondary | ICD-10-CM | POA: Diagnosis not present

## 2018-08-23 DIAGNOSIS — R41841 Cognitive communication deficit: Secondary | ICD-10-CM | POA: Diagnosis not present

## 2018-08-24 DIAGNOSIS — L720 Epidermal cyst: Secondary | ICD-10-CM | POA: Diagnosis not present

## 2018-08-24 DIAGNOSIS — H02032 Senile entropion of right lower eyelid: Secondary | ICD-10-CM | POA: Diagnosis not present

## 2018-08-24 DIAGNOSIS — H11821 Conjunctivochalasis, right eye: Secondary | ICD-10-CM | POA: Diagnosis not present

## 2018-08-24 DIAGNOSIS — H02002 Unspecified entropion of right lower eyelid: Secondary | ICD-10-CM | POA: Diagnosis not present

## 2018-08-24 DIAGNOSIS — H02532 Eyelid retraction right lower eyelid: Secondary | ICD-10-CM | POA: Diagnosis not present

## 2018-08-24 DIAGNOSIS — H04561 Stenosis of right lacrimal punctum: Secondary | ICD-10-CM | POA: Diagnosis not present

## 2018-08-24 DIAGNOSIS — H02052 Trichiasis without entropian right lower eyelid: Secondary | ICD-10-CM | POA: Diagnosis not present

## 2018-08-25 DIAGNOSIS — K219 Gastro-esophageal reflux disease without esophagitis: Secondary | ICD-10-CM | POA: Diagnosis not present

## 2018-08-25 DIAGNOSIS — I63312 Cerebral infarction due to thrombosis of left middle cerebral artery: Secondary | ICD-10-CM | POA: Diagnosis not present

## 2018-08-25 DIAGNOSIS — R1312 Dysphagia, oropharyngeal phase: Secondary | ICD-10-CM | POA: Diagnosis not present

## 2018-08-25 DIAGNOSIS — R41841 Cognitive communication deficit: Secondary | ICD-10-CM | POA: Diagnosis not present

## 2018-08-28 ENCOUNTER — Ambulatory Visit (INDEPENDENT_AMBULATORY_CARE_PROVIDER_SITE_OTHER): Payer: Medicare Other | Admitting: Internal Medicine

## 2018-08-28 ENCOUNTER — Encounter: Payer: Self-pay | Admitting: Internal Medicine

## 2018-08-28 VITALS — BP 102/66 | HR 50 | Ht 67.0 in | Wt 109.6 lb

## 2018-08-28 DIAGNOSIS — I63312 Cerebral infarction due to thrombosis of left middle cerebral artery: Secondary | ICD-10-CM

## 2018-08-28 DIAGNOSIS — I480 Paroxysmal atrial fibrillation: Secondary | ICD-10-CM | POA: Diagnosis not present

## 2018-08-28 DIAGNOSIS — K219 Gastro-esophageal reflux disease without esophagitis: Secondary | ICD-10-CM | POA: Diagnosis not present

## 2018-08-28 DIAGNOSIS — R1312 Dysphagia, oropharyngeal phase: Secondary | ICD-10-CM | POA: Diagnosis not present

## 2018-08-28 DIAGNOSIS — R41841 Cognitive communication deficit: Secondary | ICD-10-CM | POA: Diagnosis not present

## 2018-08-28 DIAGNOSIS — R55 Syncope and collapse: Secondary | ICD-10-CM | POA: Diagnosis not present

## 2018-08-28 NOTE — Patient Instructions (Signed)
Medication Instructions:  No change If you need a refill on your cardiac medications before your next appointment, please call your pharmacy.   Lab work: none If you have labs (blood work) drawn today and your tests are completely normal, you will receive your results only by: Marland Kitchen MyChart Message (if you have MyChart) OR . A paper copy in the mail If you have any lab test that is abnormal or we need to change your treatment, we will call you to review the results.  Testing/Procedures: none  Follow-Up: At [facility], you and your health needs are our priority.  As part of our continuing mission to provide you with exceptional heart care, we have created designated Provider Care Teams.  These Care Teams include your primary Cardiologist (physician) and Advanced Practice Providers (APPs -  Physician Assistants and Nurse Practitioners) who all work together to provide you with the care you need, when you need it. You will need a follow up appointment in:  5 months.  Please call our office 2 months in advance to schedule this appointment.  You may see Dorris Carnes, MD or one of the following Advanced Practice Providers on your designated Care Team: Richardson Dopp, PA-C [place], [place] . Daune Perch, NP  Any Other Special Instructions Will Be Listed Below (If Applicable).

## 2018-08-28 NOTE — Progress Notes (Signed)
Cardiology Office Note   Date:  08/28/2018   ID:  Kayla Kent, DOB Jul 03, 1932, MRN [id number]  PCP:  Biagio Borg, MD  Cardiologist:   Dorris Carnes, MD    F/U of atrial fib and HTN     History of Present Illness: Kayla Kent is a 84 y.o. female with a history of atrial fib and HTN I saw her back in October 2018 Stress myovue in June 2018 neg for ischemia She was admitted later in the month with sepsis felt due to &TI  Admitted late in June with CP  She was in afib at time   Stress myovue in July 2018 negative for ischemia    She was admitted in Dec with GI bleeding and pneumonia.  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 acetaminophen (TYLENOL) 325 MG tablet Take 650 mg by mouth every 4 (four) hours as needed.  Marland Kitchen apixaban (ELIQUIS) 2.5 MG TABS tablet Take 2.5 mg by mouth 2 (two) times daily.  Marland Kitchen atorvastatin (LIPITOR) 10 MG tablet   . bethanechol (URECHOLINE) 10 MG tablet Take 1 tablet (10 mg total) by mouth 3 (three) times daily.  . bisacodyl (DULCOLAX) 10 MG suppository Place 1 suppository (10 mg total) rectally daily as needed for moderate constipation.  . Calcium Carbonate Antacid (MAALOX PO) Take by mouth as needed.  . chlorhexidine gluconate, MEDLINE KIT, (PERIDEX) 0.12 % solution 15 mLs by Mouth Rinse route 2 (two) times daily.  Marland Kitchen escitalopram (LEXAPRO) 5 MG tablet Take 1 tablet (5 mg total) by mouth daily.  . famotidine (PEPCID) 20 MG tablet Take 20 mg by mouth 2 (two) times daily.  . Melatonin 3 MG TABS Take 0.5 tablets (1.5 mg total) by mouth  at bedtime. (Patient taking differently: Take 3 mg by mouth at bedtime. )  . metoprolol tartrate (LOPRESSOR) 25 MG tablet Take 25 mg by mouth 2 (two) times daily.  Marland Kitchen mouth rinse LIQD solution 15 mLs by Mouth Rinse route 2 times daily at 12 noon and 4 pm.  . ondansetron (ZOFRAN) 4 MG tablet Take 1 tablet (4 mg total) by mouth every 6 (six) hours as needed for nausea or vomiting.  . polyethylene glycol (MIRALAX / GLYCOLAX) packet Place 17 g into feeding tube 2 (two) times daily with breakfast and lunch.  . simethicone (MYLICON) 40 BL/3.9QZ drops Place 0.6 mLs (40 mg total) into feeding tube 4 (four) times daily as needed for flatulence.  . sodium phosphate (FLEET) 7-19 GM/118ML ENEM Place 133 mLs (1 enema total) rectally daily as needed for severe constipation.  Marland Kitchen trimethoprim-polymyxin b (POLYTRIM) ophthalmic solution Place 1 drop into the right eye 4 (four) times daily.     Allergies:   Chocolate; Codeine; Diazepam; Fruit & vegetable daily [nutritional supplements]; Iohexol; Latex; Peach [prunus persica]; Peanut-containing drug products; Penicillins; Strawberry extract; Sulfonamide derivatives; Rosuvastatin calcium; Sulfa antibiotics; Sulfamethoxazole; Crestor [rosuvastatin calcium]; Iodine; Wheat;  and Wheat bran   Past Medical History:  Diagnosis Date  . Allergic rhinitis   . Anxiety   . CKD (chronic kidney disease) stage 3, GFR 30-59 ml/min (HCC)   . Emphysema of lung ([place])   . HLD (hyperlipidemia)   . HTN (hypertension)   . Intolerance of drug    orthostatic  . Paroxysmal supraventricular tachycardia ([place])   . PVD (peripheral vascular disease) ([place])   . Seizure ([place])   . Syncope and collapse   . Uterine prolapse without mention of vaginal wall prolapse     Past Surgical History:  Procedure Laterality Date  . CARDIOVERSION N/A 11/05/2014   Procedure: CARDIOVERSION;  Surgeon: Candee Furbish, MD;  Location: [facility] ENDOSCOPY;  Service: Cardiovascular;  Laterality: N/A;  . cataract surgery   08/2015  . CHOLECYSTECTOMY    . corrective eye surgery     as a child  . ESOPHAGOGASTRODUODENOSCOPY N/A 09/09/2017   Procedure: ESOPHAGOGASTRODUODENOSCOPY (EGD);  Surgeon: Mauri Pole, MD;  Location: [facility] ENDOSCOPY;  Service: Endoscopy;  Laterality: N/A;  . INTRAMEDULLARY (IM) NAIL INTERTROCHANTERIC Left 11/21/2016   Procedure: INTRAMEDULLARY (IM) NAIL INTERTROCHANTRIC HEMI;  Surgeon: Newt Minion, MD;  Location: [place];  Service: Orthopedics;  Laterality: Left;  . IR GASTROSTOMY TUBE MOD SED  09/26/2017  . IR PERCUTANEOUS ART THROMBECTOMY/INFUSION INTRACRANIAL INC DIAG ANGIO  09/11/2017  . IVD removed    . OPEN REDUCTION INTERNAL FIXATION (ORIF) DISTAL RADIAL FRACTURE Right 08/29/2014   Procedure: OPEN REDUCTION INTERNAL FIXATION (ORIF) DISTAL RADIAL FRACTURE;  Surgeon: Marianna Payment, MD;  Location: [place];  Service: Orthopedics;  Laterality: Right;  . RADIOLOGY WITH ANESTHESIA N/A 09/11/2017   Procedure: RADIOLOGY WITH ANESTHESIA;  Surgeon: Luanne Bras, MD;  Location: [place];  Service: Radiology;  Laterality: N/A;  . RF ablation PSVT     summer '10  . stress cardiolite  08/05/93  . TONSILLECTOMY    . TOTAL HIP ARTHROPLASTY Right 08/29/2014   Procedure: Right Hip Hemi Arthroplasty;  Surgeon: Marianna Payment, MD;  Location: [employer];  Service: Orthopedics;  Laterality: Right;  Hip procedure 1st wants [employer], [employer], [place].      Social History:  The patient  reports that she quit smoking about 24 years ago. She started smoking about 66 years ago. She has a 9.50 pack-year smoking history. She has never used smokeless tobacco. She reports that she does not drink alcohol or use drugs.   Family History:  The patient's family history includes Arthritis in her father; Heart disease in her brother; Hyperlipidemia in her sister; Hypertension in her brother, sister, and unknown relative; Mental illness in her father.    ROS:  Please see the history of present illness.  All other systems are reviewed and  Negative to the above problem except as noted.    PHYSICAL EXAM: VS:  BP 102/66   Pulse (!) 50   Ht _0  (1.702 m)   Wt 109 lb 9.6 oz (49.7 kg)   SpO2 96%   BMI 17.17 kg/m   GEN: Thin 85  Y in no acute distress  HEENT: normal  Neck: JVP is normal     No , carotid bruits, or masses Cardiac: Regular rhythm no murmurs, rubs, or gallops,no edema  Respiratory:  Clear to auscultation bilaterally, normal work of breathing GI: soft, nontender, nondistended, + BS  No hepatomegaly  MS: no deformity Moving all extremities   Skin: warm and dry, no rash Neuro:  Deferred  EKG:  EKG is not ordered today.   Lipid Panel    Component Value Date/Time   CHOL 132 10/30/2015 0931   TRIG 99 09/14/2017 2315   TRIG 141 10/25/2006 0906   HDL 33 (L) 10/30/2015 0931   CHOLHDL 4.0 10/30/2015 0931   VLDL 14 10/30/2015 0931   LDLCALC 85 10/30/2015 0931   LDLDIRECT 175.3 01/19/2008 1216      Wt Readings from Last 3 Encounters:  08/28/18 109 lb 9.6 oz (49.7 kg)  06/30/18 107 lb (48.5 kg)  05/30/18 105 lb (47.6 kg)      ASSESSMENT AND PLAN:  1  Persistent atrial fib  Keep on current regmen with anticoagulation    2  HTN  Well controlled   3  CVA  Continues with rehab   No change in meds   I will see in clinic in 5 months    F?U in 6 months    Current medicines are reviewed at length with the patient today.  The patient does not have concerns regarding medicines.  Signed, Dorris Carnes, MD  08/28/2018 10:07 AM    [address], [place], [place]  [id number] Phone: [phone]; Fax: [phone]

## 2018-08-29 DIAGNOSIS — R1312 Dysphagia, oropharyngeal phase: Secondary | ICD-10-CM | POA: Diagnosis not present

## 2018-08-29 DIAGNOSIS — K219 Gastro-esophageal reflux disease without esophagitis: Secondary | ICD-10-CM | POA: Diagnosis not present

## 2018-08-29 DIAGNOSIS — I63312 Cerebral infarction due to thrombosis of left middle cerebral artery: Secondary | ICD-10-CM | POA: Diagnosis not present

## 2018-08-29 DIAGNOSIS — R41841 Cognitive communication deficit: Secondary | ICD-10-CM | POA: Diagnosis not present

## 2018-08-29 DIAGNOSIS — N939 Abnormal uterine and vaginal bleeding, unspecified: Secondary | ICD-10-CM | POA: Diagnosis not present

## 2018-08-29 DIAGNOSIS — I4819 Other persistent atrial fibrillation: Secondary | ICD-10-CM | POA: Diagnosis not present

## 2018-08-30 DIAGNOSIS — K219 Gastro-esophageal reflux disease without esophagitis: Secondary | ICD-10-CM | POA: Diagnosis not present

## 2018-08-30 DIAGNOSIS — R1312 Dysphagia, oropharyngeal phase: Secondary | ICD-10-CM | POA: Diagnosis not present

## 2018-08-30 DIAGNOSIS — G4700 Insomnia, unspecified: Secondary | ICD-10-CM | POA: Diagnosis not present

## 2018-08-30 DIAGNOSIS — F331 Major depressive disorder, recurrent, moderate: Secondary | ICD-10-CM | POA: Diagnosis not present

## 2018-08-30 DIAGNOSIS — I63312 Cerebral infarction due to thrombosis of left middle cerebral artery: Secondary | ICD-10-CM | POA: Diagnosis not present

## 2018-08-30 DIAGNOSIS — N939 Abnormal uterine and vaginal bleeding, unspecified: Secondary | ICD-10-CM | POA: Diagnosis not present

## 2018-08-30 DIAGNOSIS — R41841 Cognitive communication deficit: Secondary | ICD-10-CM | POA: Diagnosis not present

## 2018-08-30 DIAGNOSIS — D259 Leiomyoma of uterus, unspecified: Secondary | ICD-10-CM | POA: Diagnosis not present

## 2018-08-30 DIAGNOSIS — D649 Anemia, unspecified: Secondary | ICD-10-CM | POA: Diagnosis not present

## 2018-08-30 DIAGNOSIS — F419 Anxiety disorder, unspecified: Secondary | ICD-10-CM | POA: Diagnosis not present

## 2018-08-31 DIAGNOSIS — I63312 Cerebral infarction due to thrombosis of left middle cerebral artery: Secondary | ICD-10-CM | POA: Diagnosis not present

## 2018-08-31 DIAGNOSIS — R1312 Dysphagia, oropharyngeal phase: Secondary | ICD-10-CM | POA: Diagnosis not present

## 2018-08-31 DIAGNOSIS — K219 Gastro-esophageal reflux disease without esophagitis: Secondary | ICD-10-CM | POA: Diagnosis not present

## 2018-08-31 DIAGNOSIS — R41841 Cognitive communication deficit: Secondary | ICD-10-CM | POA: Diagnosis not present

## 2018-09-01 DIAGNOSIS — I63312 Cerebral infarction due to thrombosis of left middle cerebral artery: Secondary | ICD-10-CM | POA: Diagnosis not present

## 2018-09-01 DIAGNOSIS — R1312 Dysphagia, oropharyngeal phase: Secondary | ICD-10-CM | POA: Diagnosis not present

## 2018-09-01 DIAGNOSIS — R41841 Cognitive communication deficit: Secondary | ICD-10-CM | POA: Diagnosis not present

## 2018-09-01 DIAGNOSIS — K219 Gastro-esophageal reflux disease without esophagitis: Secondary | ICD-10-CM | POA: Diagnosis not present

## 2018-09-04 DIAGNOSIS — R41841 Cognitive communication deficit: Secondary | ICD-10-CM | POA: Diagnosis not present

## 2018-09-04 DIAGNOSIS — I63312 Cerebral infarction due to thrombosis of left middle cerebral artery: Secondary | ICD-10-CM | POA: Diagnosis not present

## 2018-09-04 DIAGNOSIS — K219 Gastro-esophageal reflux disease without esophagitis: Secondary | ICD-10-CM | POA: Diagnosis not present

## 2018-09-04 DIAGNOSIS — R1312 Dysphagia, oropharyngeal phase: Secondary | ICD-10-CM | POA: Diagnosis not present

## 2018-09-05 DIAGNOSIS — R41841 Cognitive communication deficit: Secondary | ICD-10-CM | POA: Diagnosis not present

## 2018-09-05 DIAGNOSIS — Z85118 Personal history of other malignant neoplasm of bronchus and lung: Secondary | ICD-10-CM | POA: Diagnosis not present

## 2018-09-05 DIAGNOSIS — R1312 Dysphagia, oropharyngeal phase: Secondary | ICD-10-CM | POA: Diagnosis not present

## 2018-09-05 DIAGNOSIS — I63312 Cerebral infarction due to thrombosis of left middle cerebral artery: Secondary | ICD-10-CM | POA: Diagnosis not present

## 2018-09-05 DIAGNOSIS — I4891 Unspecified atrial fibrillation: Secondary | ICD-10-CM | POA: Diagnosis not present

## 2018-09-05 DIAGNOSIS — R32 Unspecified urinary incontinence: Secondary | ICD-10-CM | POA: Diagnosis not present

## 2018-09-05 DIAGNOSIS — R54 Age-related physical debility: Secondary | ICD-10-CM | POA: Diagnosis not present

## 2018-09-05 DIAGNOSIS — J449 Chronic obstructive pulmonary disease, unspecified: Secondary | ICD-10-CM | POA: Diagnosis not present

## 2018-09-05 DIAGNOSIS — I69351 Hemiplegia and hemiparesis following cerebral infarction affecting right dominant side: Secondary | ICD-10-CM | POA: Diagnosis not present

## 2018-09-05 DIAGNOSIS — I639 Cerebral infarction, unspecified: Secondary | ICD-10-CM | POA: Diagnosis not present

## 2018-09-05 DIAGNOSIS — K219 Gastro-esophageal reflux disease without esophagitis: Secondary | ICD-10-CM | POA: Diagnosis not present

## 2018-09-05 DIAGNOSIS — E440 Moderate protein-calorie malnutrition: Secondary | ICD-10-CM | POA: Diagnosis not present

## 2018-09-05 DIAGNOSIS — R52 Pain, unspecified: Secondary | ICD-10-CM | POA: Diagnosis not present

## 2018-09-05 DIAGNOSIS — I69391 Dysphagia following cerebral infarction: Secondary | ICD-10-CM | POA: Diagnosis not present

## 2018-09-05 DIAGNOSIS — R338 Other retention of urine: Secondary | ICD-10-CM | POA: Diagnosis not present

## 2018-09-06 DIAGNOSIS — I63312 Cerebral infarction due to thrombosis of left middle cerebral artery: Secondary | ICD-10-CM | POA: Diagnosis not present

## 2018-09-06 DIAGNOSIS — R41841 Cognitive communication deficit: Secondary | ICD-10-CM | POA: Diagnosis not present

## 2018-09-06 DIAGNOSIS — K219 Gastro-esophageal reflux disease without esophagitis: Secondary | ICD-10-CM | POA: Diagnosis not present

## 2018-09-06 DIAGNOSIS — R1312 Dysphagia, oropharyngeal phase: Secondary | ICD-10-CM | POA: Diagnosis not present

## 2018-09-07 DIAGNOSIS — R1312 Dysphagia, oropharyngeal phase: Secondary | ICD-10-CM | POA: Diagnosis not present

## 2018-09-07 DIAGNOSIS — I63312 Cerebral infarction due to thrombosis of left middle cerebral artery: Secondary | ICD-10-CM | POA: Diagnosis not present

## 2018-09-07 DIAGNOSIS — K219 Gastro-esophageal reflux disease without esophagitis: Secondary | ICD-10-CM | POA: Diagnosis not present

## 2018-09-07 DIAGNOSIS — R41841 Cognitive communication deficit: Secondary | ICD-10-CM | POA: Diagnosis not present

## 2018-09-08 DIAGNOSIS — R1312 Dysphagia, oropharyngeal phase: Secondary | ICD-10-CM | POA: Diagnosis not present

## 2018-09-08 DIAGNOSIS — R41841 Cognitive communication deficit: Secondary | ICD-10-CM | POA: Diagnosis not present

## 2018-09-08 DIAGNOSIS — K219 Gastro-esophageal reflux disease without esophagitis: Secondary | ICD-10-CM | POA: Diagnosis not present

## 2018-09-08 DIAGNOSIS — I63312 Cerebral infarction due to thrombosis of left middle cerebral artery: Secondary | ICD-10-CM | POA: Diagnosis not present

## 2018-09-09 DIAGNOSIS — R41841 Cognitive communication deficit: Secondary | ICD-10-CM | POA: Diagnosis not present

## 2018-09-09 DIAGNOSIS — I63312 Cerebral infarction due to thrombosis of left middle cerebral artery: Secondary | ICD-10-CM | POA: Diagnosis not present

## 2018-09-09 DIAGNOSIS — K219 Gastro-esophageal reflux disease without esophagitis: Secondary | ICD-10-CM | POA: Diagnosis not present

## 2018-09-09 DIAGNOSIS — R1312 Dysphagia, oropharyngeal phase: Secondary | ICD-10-CM | POA: Diagnosis not present

## 2018-09-12 DIAGNOSIS — R41841 Cognitive communication deficit: Secondary | ICD-10-CM | POA: Diagnosis not present

## 2018-09-12 DIAGNOSIS — K219 Gastro-esophageal reflux disease without esophagitis: Secondary | ICD-10-CM | POA: Diagnosis not present

## 2018-09-12 DIAGNOSIS — I63312 Cerebral infarction due to thrombosis of left middle cerebral artery: Secondary | ICD-10-CM | POA: Diagnosis not present

## 2018-09-12 DIAGNOSIS — R1312 Dysphagia, oropharyngeal phase: Secondary | ICD-10-CM | POA: Diagnosis not present

## 2018-09-13 DIAGNOSIS — K219 Gastro-esophageal reflux disease without esophagitis: Secondary | ICD-10-CM | POA: Diagnosis not present

## 2018-09-13 DIAGNOSIS — R41841 Cognitive communication deficit: Secondary | ICD-10-CM | POA: Diagnosis not present

## 2018-09-13 DIAGNOSIS — R1312 Dysphagia, oropharyngeal phase: Secondary | ICD-10-CM | POA: Diagnosis not present

## 2018-09-13 DIAGNOSIS — I63312 Cerebral infarction due to thrombosis of left middle cerebral artery: Secondary | ICD-10-CM | POA: Diagnosis not present

## 2018-09-16 DIAGNOSIS — K219 Gastro-esophageal reflux disease without esophagitis: Secondary | ICD-10-CM | POA: Diagnosis not present

## 2018-09-16 DIAGNOSIS — I63312 Cerebral infarction due to thrombosis of left middle cerebral artery: Secondary | ICD-10-CM | POA: Diagnosis not present

## 2018-09-16 DIAGNOSIS — R1312 Dysphagia, oropharyngeal phase: Secondary | ICD-10-CM | POA: Diagnosis not present

## 2018-09-16 DIAGNOSIS — R41841 Cognitive communication deficit: Secondary | ICD-10-CM | POA: Diagnosis not present

## 2018-09-17 DIAGNOSIS — R41841 Cognitive communication deficit: Secondary | ICD-10-CM | POA: Diagnosis not present

## 2018-09-17 DIAGNOSIS — I63312 Cerebral infarction due to thrombosis of left middle cerebral artery: Secondary | ICD-10-CM | POA: Diagnosis not present

## 2018-09-17 DIAGNOSIS — K219 Gastro-esophageal reflux disease without esophagitis: Secondary | ICD-10-CM | POA: Diagnosis not present

## 2018-09-17 DIAGNOSIS — R1312 Dysphagia, oropharyngeal phase: Secondary | ICD-10-CM | POA: Diagnosis not present

## 2018-09-17 DIAGNOSIS — M6281 Muscle weakness (generalized): Secondary | ICD-10-CM | POA: Diagnosis not present

## 2018-09-17 DIAGNOSIS — R2689 Other abnormalities of gait and mobility: Secondary | ICD-10-CM | POA: Diagnosis not present

## 2018-09-18 DIAGNOSIS — M6281 Muscle weakness (generalized): Secondary | ICD-10-CM | POA: Diagnosis not present

## 2018-09-18 DIAGNOSIS — K219 Gastro-esophageal reflux disease without esophagitis: Secondary | ICD-10-CM | POA: Diagnosis not present

## 2018-09-18 DIAGNOSIS — I63312 Cerebral infarction due to thrombosis of left middle cerebral artery: Secondary | ICD-10-CM | POA: Diagnosis not present

## 2018-09-18 DIAGNOSIS — R41841 Cognitive communication deficit: Secondary | ICD-10-CM | POA: Diagnosis not present

## 2018-09-18 DIAGNOSIS — R2689 Other abnormalities of gait and mobility: Secondary | ICD-10-CM | POA: Diagnosis not present

## 2018-09-18 DIAGNOSIS — R1312 Dysphagia, oropharyngeal phase: Secondary | ICD-10-CM | POA: Diagnosis not present

## 2018-09-19 DIAGNOSIS — M6281 Muscle weakness (generalized): Secondary | ICD-10-CM | POA: Diagnosis not present

## 2018-09-19 DIAGNOSIS — R1312 Dysphagia, oropharyngeal phase: Secondary | ICD-10-CM | POA: Diagnosis not present

## 2018-09-19 DIAGNOSIS — R2689 Other abnormalities of gait and mobility: Secondary | ICD-10-CM | POA: Diagnosis not present

## 2018-09-19 DIAGNOSIS — R41841 Cognitive communication deficit: Secondary | ICD-10-CM | POA: Diagnosis not present

## 2018-09-19 DIAGNOSIS — K219 Gastro-esophageal reflux disease without esophagitis: Secondary | ICD-10-CM | POA: Diagnosis not present

## 2018-09-19 DIAGNOSIS — I63312 Cerebral infarction due to thrombosis of left middle cerebral artery: Secondary | ICD-10-CM | POA: Diagnosis not present

## 2018-09-20 ENCOUNTER — Ambulatory Visit (HOSPITAL_COMMUNITY)
Admission: RE | Admit: 2018-09-20 | Discharge: 2018-09-20 | Disposition: A | Discharge status: Home / Self Care | Payer: Medicare Other | Source: Ambulatory Visit | Attending: Radiation Oncology | Admitting: Radiation Oncology

## 2018-09-20 DIAGNOSIS — M6281 Muscle weakness (generalized): Secondary | ICD-10-CM | POA: Diagnosis not present

## 2018-09-20 DIAGNOSIS — C3411 Malignant neoplasm of upper lobe, right bronchus or lung: Secondary | ICD-10-CM | POA: Diagnosis not present

## 2018-09-20 DIAGNOSIS — R2689 Other abnormalities of gait and mobility: Secondary | ICD-10-CM | POA: Diagnosis not present

## 2018-09-20 DIAGNOSIS — N939 Abnormal uterine and vaginal bleeding, unspecified: Secondary | ICD-10-CM | POA: Diagnosis not present

## 2018-09-20 DIAGNOSIS — C3490 Malignant neoplasm of unspecified part of unspecified bronchus or lung: Secondary | ICD-10-CM | POA: Diagnosis not present

## 2018-09-20 DIAGNOSIS — R41841 Cognitive communication deficit: Secondary | ICD-10-CM | POA: Diagnosis not present

## 2018-09-20 DIAGNOSIS — K219 Gastro-esophageal reflux disease without esophagitis: Secondary | ICD-10-CM | POA: Diagnosis not present

## 2018-09-20 DIAGNOSIS — R1312 Dysphagia, oropharyngeal phase: Secondary | ICD-10-CM | POA: Diagnosis not present

## 2018-09-20 DIAGNOSIS — I4819 Other persistent atrial fibrillation: Secondary | ICD-10-CM | POA: Diagnosis not present

## 2018-09-20 DIAGNOSIS — I63312 Cerebral infarction due to thrombosis of left middle cerebral artery: Secondary | ICD-10-CM | POA: Diagnosis not present

## 2018-09-21 ENCOUNTER — Telehealth: Payer: Self-pay | Admitting: Radiation Oncology

## 2018-09-21 DIAGNOSIS — M6281 Muscle weakness (generalized): Secondary | ICD-10-CM | POA: Diagnosis not present

## 2018-09-21 DIAGNOSIS — R1312 Dysphagia, oropharyngeal phase: Secondary | ICD-10-CM | POA: Diagnosis not present

## 2018-09-21 DIAGNOSIS — R41841 Cognitive communication deficit: Secondary | ICD-10-CM | POA: Diagnosis not present

## 2018-09-21 DIAGNOSIS — C3411 Malignant neoplasm of upper lobe, right bronchus or lung: Secondary | ICD-10-CM

## 2018-09-21 DIAGNOSIS — R2689 Other abnormalities of gait and mobility: Secondary | ICD-10-CM | POA: Diagnosis not present

## 2018-09-21 DIAGNOSIS — K219 Gastro-esophageal reflux disease without esophagitis: Secondary | ICD-10-CM | POA: Diagnosis not present

## 2018-09-21 DIAGNOSIS — I63312 Cerebral infarction due to thrombosis of left middle cerebral artery: Secondary | ICD-10-CM | POA: Diagnosis not present

## 2018-09-21 NOTE — Telephone Encounter (Signed)
I spoke with the patient's daughter and reviewed the rationale for repeat scan in 6 months. She let me know that her mom is having PMB and is in the process of being worked up for this. I agree that she needs to proceed with endometrial sampling, and if malignancy was seen to be evaluated by GYN oncology. We will plan her repeat CT chest though in 6 months.

## 2018-10-03 DIAGNOSIS — N870 Mild cervical dysplasia: Secondary | ICD-10-CM | POA: Diagnosis not present

## 2018-10-03 DIAGNOSIS — N950 Postmenopausal bleeding: Secondary | ICD-10-CM | POA: Diagnosis not present

## 2018-10-04 DIAGNOSIS — G4701 Insomnia due to medical condition: Secondary | ICD-10-CM | POA: Diagnosis not present

## 2018-10-04 DIAGNOSIS — F331 Major depressive disorder, recurrent, moderate: Secondary | ICD-10-CM | POA: Diagnosis not present

## 2018-10-06 DIAGNOSIS — Z85118 Personal history of other malignant neoplasm of bronchus and lung: Secondary | ICD-10-CM | POA: Diagnosis not present

## 2018-10-06 DIAGNOSIS — R32 Unspecified urinary incontinence: Secondary | ICD-10-CM | POA: Diagnosis not present

## 2018-10-06 DIAGNOSIS — I69391 Dysphagia following cerebral infarction: Secondary | ICD-10-CM | POA: Diagnosis not present

## 2018-10-06 DIAGNOSIS — I69351 Hemiplegia and hemiparesis following cerebral infarction affecting right dominant side: Secondary | ICD-10-CM | POA: Diagnosis not present

## 2018-10-06 DIAGNOSIS — R54 Age-related physical debility: Secondary | ICD-10-CM | POA: Diagnosis not present

## 2018-10-06 DIAGNOSIS — J449 Chronic obstructive pulmonary disease, unspecified: Secondary | ICD-10-CM | POA: Diagnosis not present

## 2018-10-06 DIAGNOSIS — I639 Cerebral infarction, unspecified: Secondary | ICD-10-CM | POA: Diagnosis not present

## 2018-10-06 DIAGNOSIS — N950 Postmenopausal bleeding: Secondary | ICD-10-CM | POA: Diagnosis not present

## 2018-10-06 DIAGNOSIS — D259 Leiomyoma of uterus, unspecified: Secondary | ICD-10-CM | POA: Diagnosis not present

## 2018-10-06 DIAGNOSIS — I4891 Unspecified atrial fibrillation: Secondary | ICD-10-CM | POA: Diagnosis not present

## 2018-10-06 DIAGNOSIS — K219 Gastro-esophageal reflux disease without esophagitis: Secondary | ICD-10-CM | POA: Diagnosis not present

## 2018-10-06 DIAGNOSIS — E440 Moderate protein-calorie malnutrition: Secondary | ICD-10-CM | POA: Diagnosis not present

## 2018-11-01 DIAGNOSIS — Z0000 Encounter for general adult medical examination without abnormal findings: Secondary | ICD-10-CM | POA: Diagnosis not present

## 2018-11-01 DIAGNOSIS — I4891 Unspecified atrial fibrillation: Secondary | ICD-10-CM | POA: Diagnosis not present

## 2018-11-06 ENCOUNTER — Telehealth: Payer: Self-pay | Admitting: Internal Medicine

## 2018-11-06 NOTE — Telephone Encounter (Signed)
New Message          Patient's daughter called to get a ealier appt for her mom, but it moves them out a little pass the 5 month mark is that ok? Pls call daughter to clarify.

## 2018-11-06 NOTE — Telephone Encounter (Signed)
Spoke with patient's daughter (DPR), [name], pts next appt is scheduled in May, which is fine.  Dtr knows to call if needs to report any changes/concerns.

## 2018-11-07 DIAGNOSIS — R338 Other retention of urine: Secondary | ICD-10-CM | POA: Diagnosis not present

## 2018-11-07 DIAGNOSIS — I69351 Hemiplegia and hemiparesis following cerebral infarction affecting right dominant side: Secondary | ICD-10-CM | POA: Diagnosis not present

## 2018-11-07 DIAGNOSIS — Z85118 Personal history of other malignant neoplasm of bronchus and lung: Secondary | ICD-10-CM | POA: Diagnosis not present

## 2018-11-07 DIAGNOSIS — R32 Unspecified urinary incontinence: Secondary | ICD-10-CM | POA: Diagnosis not present

## 2018-11-07 DIAGNOSIS — E440 Moderate protein-calorie malnutrition: Secondary | ICD-10-CM | POA: Diagnosis not present

## 2018-11-07 DIAGNOSIS — I69322 Dysarthria following cerebral infarction: Secondary | ICD-10-CM | POA: Diagnosis not present

## 2018-11-07 DIAGNOSIS — R52 Pain, unspecified: Secondary | ICD-10-CM | POA: Diagnosis not present

## 2018-11-07 DIAGNOSIS — K219 Gastro-esophageal reflux disease without esophagitis: Secondary | ICD-10-CM | POA: Diagnosis not present

## 2018-11-07 DIAGNOSIS — R54 Age-related physical debility: Secondary | ICD-10-CM | POA: Diagnosis not present

## 2018-11-07 DIAGNOSIS — I69391 Dysphagia following cerebral infarction: Secondary | ICD-10-CM | POA: Diagnosis not present

## 2018-11-07 DIAGNOSIS — J449 Chronic obstructive pulmonary disease, unspecified: Secondary | ICD-10-CM | POA: Diagnosis not present

## 2018-11-07 DIAGNOSIS — I4891 Unspecified atrial fibrillation: Secondary | ICD-10-CM | POA: Diagnosis not present

## 2018-11-20 DIAGNOSIS — I63312 Cerebral infarction due to thrombosis of left middle cerebral artery: Secondary | ICD-10-CM | POA: Diagnosis not present

## 2018-11-20 DIAGNOSIS — R2689 Other abnormalities of gait and mobility: Secondary | ICD-10-CM | POA: Diagnosis not present

## 2018-11-22 DIAGNOSIS — R2689 Other abnormalities of gait and mobility: Secondary | ICD-10-CM | POA: Diagnosis not present

## 2018-11-22 DIAGNOSIS — I63312 Cerebral infarction due to thrombosis of left middle cerebral artery: Secondary | ICD-10-CM | POA: Diagnosis not present

## 2018-11-23 DIAGNOSIS — I63312 Cerebral infarction due to thrombosis of left middle cerebral artery: Secondary | ICD-10-CM | POA: Diagnosis not present

## 2018-11-23 DIAGNOSIS — R2689 Other abnormalities of gait and mobility: Secondary | ICD-10-CM | POA: Diagnosis not present

## 2018-11-24 DIAGNOSIS — I63312 Cerebral infarction due to thrombosis of left middle cerebral artery: Secondary | ICD-10-CM | POA: Diagnosis not present

## 2018-11-24 DIAGNOSIS — R2689 Other abnormalities of gait and mobility: Secondary | ICD-10-CM | POA: Diagnosis not present

## 2018-11-26 DIAGNOSIS — I63312 Cerebral infarction due to thrombosis of left middle cerebral artery: Secondary | ICD-10-CM | POA: Diagnosis not present

## 2018-11-26 DIAGNOSIS — R2689 Other abnormalities of gait and mobility: Secondary | ICD-10-CM | POA: Diagnosis not present

## 2018-11-27 DIAGNOSIS — I63312 Cerebral infarction due to thrombosis of left middle cerebral artery: Secondary | ICD-10-CM | POA: Diagnosis not present

## 2018-11-27 DIAGNOSIS — R2689 Other abnormalities of gait and mobility: Secondary | ICD-10-CM | POA: Diagnosis not present

## 2018-11-28 DIAGNOSIS — R2689 Other abnormalities of gait and mobility: Secondary | ICD-10-CM | POA: Diagnosis not present

## 2018-11-28 DIAGNOSIS — I63312 Cerebral infarction due to thrombosis of left middle cerebral artery: Secondary | ICD-10-CM | POA: Diagnosis not present

## 2018-11-29 DIAGNOSIS — I63312 Cerebral infarction due to thrombosis of left middle cerebral artery: Secondary | ICD-10-CM | POA: Diagnosis not present

## 2018-11-29 DIAGNOSIS — G4701 Insomnia due to medical condition: Secondary | ICD-10-CM | POA: Diagnosis not present

## 2018-11-29 DIAGNOSIS — F331 Major depressive disorder, recurrent, moderate: Secondary | ICD-10-CM | POA: Diagnosis not present

## 2018-11-29 DIAGNOSIS — R2689 Other abnormalities of gait and mobility: Secondary | ICD-10-CM | POA: Diagnosis not present

## 2018-11-30 DIAGNOSIS — R2689 Other abnormalities of gait and mobility: Secondary | ICD-10-CM | POA: Diagnosis not present

## 2018-11-30 DIAGNOSIS — I63312 Cerebral infarction due to thrombosis of left middle cerebral artery: Secondary | ICD-10-CM | POA: Diagnosis not present

## 2018-12-01 DIAGNOSIS — R2689 Other abnormalities of gait and mobility: Secondary | ICD-10-CM | POA: Diagnosis not present

## 2018-12-01 DIAGNOSIS — I63312 Cerebral infarction due to thrombosis of left middle cerebral artery: Secondary | ICD-10-CM | POA: Diagnosis not present

## 2018-12-04 DIAGNOSIS — Z85118 Personal history of other malignant neoplasm of bronchus and lung: Secondary | ICD-10-CM | POA: Diagnosis not present

## 2018-12-04 DIAGNOSIS — E440 Moderate protein-calorie malnutrition: Secondary | ICD-10-CM | POA: Diagnosis not present

## 2018-12-04 DIAGNOSIS — R54 Age-related physical debility: Secondary | ICD-10-CM | POA: Diagnosis not present

## 2018-12-04 DIAGNOSIS — J309 Allergic rhinitis, unspecified: Secondary | ICD-10-CM | POA: Diagnosis not present

## 2018-12-04 DIAGNOSIS — R2689 Other abnormalities of gait and mobility: Secondary | ICD-10-CM | POA: Diagnosis not present

## 2018-12-04 DIAGNOSIS — E785 Hyperlipidemia, unspecified: Secondary | ICD-10-CM | POA: Diagnosis not present

## 2018-12-04 DIAGNOSIS — J449 Chronic obstructive pulmonary disease, unspecified: Secondary | ICD-10-CM | POA: Diagnosis not present

## 2018-12-04 DIAGNOSIS — I69351 Hemiplegia and hemiparesis following cerebral infarction affecting right dominant side: Secondary | ICD-10-CM | POA: Diagnosis not present

## 2018-12-04 DIAGNOSIS — I69391 Dysphagia following cerebral infarction: Secondary | ICD-10-CM | POA: Diagnosis not present

## 2018-12-04 DIAGNOSIS — R32 Unspecified urinary incontinence: Secondary | ICD-10-CM | POA: Diagnosis not present

## 2018-12-04 DIAGNOSIS — I739 Peripheral vascular disease, unspecified: Secondary | ICD-10-CM | POA: Diagnosis not present

## 2018-12-04 DIAGNOSIS — I63312 Cerebral infarction due to thrombosis of left middle cerebral artery: Secondary | ICD-10-CM | POA: Diagnosis not present

## 2018-12-04 DIAGNOSIS — I4891 Unspecified atrial fibrillation: Secondary | ICD-10-CM | POA: Diagnosis not present

## 2018-12-04 DIAGNOSIS — I69322 Dysarthria following cerebral infarction: Secondary | ICD-10-CM | POA: Diagnosis not present

## 2018-12-05 ENCOUNTER — Encounter: Payer: Self-pay | Admitting: Neurology

## 2018-12-05 ENCOUNTER — Ambulatory Visit (INDEPENDENT_AMBULATORY_CARE_PROVIDER_SITE_OTHER): Payer: Medicare Other | Admitting: Neurology

## 2018-12-05 ENCOUNTER — Telehealth: Payer: Self-pay | Admitting: Internal Medicine

## 2018-12-05 VITALS — BP 113/55 | HR 77 | Ht 67.0 in | Wt 117.0 lb

## 2018-12-05 DIAGNOSIS — E559 Vitamin D deficiency, unspecified: Secondary | ICD-10-CM | POA: Diagnosis not present

## 2018-12-05 DIAGNOSIS — G40209 Localization-related (focal) (partial) symptomatic epilepsy and epileptic syndromes with complex partial seizures, not intractable, without status epilepticus: Secondary | ICD-10-CM | POA: Insufficient documentation

## 2018-12-05 DIAGNOSIS — J9601 Acute respiratory failure with hypoxia: Secondary | ICD-10-CM | POA: Diagnosis not present

## 2018-12-05 DIAGNOSIS — D519 Vitamin B12 deficiency anemia, unspecified: Secondary | ICD-10-CM | POA: Diagnosis not present

## 2018-12-05 DIAGNOSIS — I69354 Hemiplegia and hemiparesis following cerebral infarction affecting left non-dominant side: Secondary | ICD-10-CM | POA: Diagnosis not present

## 2018-12-05 DIAGNOSIS — I5032 Chronic diastolic (congestive) heart failure: Secondary | ICD-10-CM | POA: Diagnosis not present

## 2018-12-05 DIAGNOSIS — I4821 Permanent atrial fibrillation: Secondary | ICD-10-CM | POA: Diagnosis not present

## 2018-12-05 DIAGNOSIS — I70209 Unspecified atherosclerosis of native arteries of extremities, unspecified extremity: Secondary | ICD-10-CM | POA: Diagnosis not present

## 2018-12-05 DIAGNOSIS — R2689 Other abnormalities of gait and mobility: Secondary | ICD-10-CM | POA: Diagnosis not present

## 2018-12-05 DIAGNOSIS — E785 Hyperlipidemia, unspecified: Secondary | ICD-10-CM | POA: Diagnosis not present

## 2018-12-05 DIAGNOSIS — E440 Moderate protein-calorie malnutrition: Secondary | ICD-10-CM | POA: Diagnosis not present

## 2018-12-05 DIAGNOSIS — I480 Paroxysmal atrial fibrillation: Secondary | ICD-10-CM

## 2018-12-05 DIAGNOSIS — M6281 Muscle weakness (generalized): Secondary | ICD-10-CM | POA: Diagnosis not present

## 2018-12-05 DIAGNOSIS — C3411 Malignant neoplasm of upper lobe, right bronchus or lung: Secondary | ICD-10-CM | POA: Diagnosis not present

## 2018-12-05 DIAGNOSIS — E039 Hypothyroidism, unspecified: Secondary | ICD-10-CM | POA: Diagnosis not present

## 2018-12-05 DIAGNOSIS — J449 Chronic obstructive pulmonary disease, unspecified: Secondary | ICD-10-CM | POA: Diagnosis not present

## 2018-12-05 DIAGNOSIS — Z79899 Other long term (current) drug therapy: Secondary | ICD-10-CM | POA: Diagnosis not present

## 2018-12-05 DIAGNOSIS — D649 Anemia, unspecified: Secondary | ICD-10-CM | POA: Diagnosis not present

## 2018-12-05 DIAGNOSIS — I10 Essential (primary) hypertension: Secondary | ICD-10-CM | POA: Diagnosis not present

## 2018-12-05 DIAGNOSIS — I63312 Cerebral infarction due to thrombosis of left middle cerebral artery: Secondary | ICD-10-CM | POA: Diagnosis not present

## 2018-12-05 DIAGNOSIS — D473 Essential (hemorrhagic) thrombocythemia: Secondary | ICD-10-CM

## 2018-12-05 NOTE — Telephone Encounter (Signed)
Received communication through my chart.    BP running lower   Would recomm cutting back metoprolol to 1x per day for a few days then stop COntinue to follow BP

## 2018-12-05 NOTE — Telephone Encounter (Signed)
See accompanying note re cutting down and stopping metoprol

## 2018-12-05 NOTE — Telephone Encounter (Signed)
Left VM at [facility] to call back. Replied to MyChart message to inform patient's daughter.

## 2018-12-05 NOTE — Progress Notes (Signed)
PATIENT: Kayla Kent DOB: 14-Jun-1932  REASON FOR VISIT: follow up-  Originally seen for seizures, and now after STROKE in 2018.   HISTORY FROM: patient and daughter -   HISTORY OF PRESENT ILLNESS:  I have the pleasure of meeting Kayla Kent today for her yearly revisit on 05 December 2018.  She is meanwhile 85 years old. The patient now ambulates with the help of a walker, her daughter accompanied her to this visit.  Patient underwent a blepharoplasty for the lower eyelid of the right eye, after having corneal abrasion from her inverted lashes.  She had vaginal bleeding, Gyn evaluation did not show why. Her blood pressure is low.  She resides now at [facility]. She has been anemic. She had a blood test at [facility], but I have no access to the results.     Interval history from 30 May 2018.  08/13/19I have the pleasure of seeing Kayla Kent. Kent today, and meanwhile 85 year old Caucasian female patient whom I have followed for the last 4 or even 5 years.  Usually we have followed for seizures, but at this time her medical interval history has been quite different.  Her daughter gives me the following information she states that her mother fell at home was brought to the [facility] emergency room on 20 November.   The patient had been typically anticoagulated due to atrial fibrillation, diastolic heart failure, these anticoagulants were discontinued as the emergency room provider stated that the patient arrived confused and was had bloody emesis- a fact the patient denies, she stated she had thrown up tomato soup.  The patient was scheduled to be evaluated for a presumed bleeding ulcer or gastritis, and an EGD was performed on 1123- 3 days after her admission.  It had been decided that she should also have a colonoscopy, but the colonoscopy never took place.  In the meantime the patient became confused indeed and it turned out that she developed a urinary tract infection with urosepsis.   According to her daughter she was very confused appeared almost delirious. She was tachycardic, in atrial fibrillation, hypoxemic.   The patient was first considered to be discharged into a regular care unit had now to be transferred to an ICU setting.  On 1125 during shift change at 7 PM the staff noted that she was aphasic acutely hemiplegic on her right and had respiratory distress. A code stroke was called, CT revealed an hyperdense sign in the MCA on the left.  The patient was not considered for TPA because of the presumed GI bleed even as her Hct/ Hgb was stabile. She underwent thrombectomy .  She recovered afterwards very slowly. She developed an aspiration pneumonia.  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  She developed urosepsis out of a UTI which was present at the time of admission. She developed aspiration pneumonia after the hemiparetic stroke.     05-09-2017, Kayla Kent is a meanwhile 85 year old sprite full female with a history of seizures that began only 4 years ago. She has continued to take Keppra 250 mA twice a day with good success. She lives independently he can perform all ADLs independently, she drives. She enjoys eating out. Participates in exercise classes and has a Physiological scientist 2 days a week. Her grandchildren attend [employer] and [employer].  Kayla Kent is an 85 year old  female with a history of seizures. She returns today for follow-up. She continues on Keppra 250 mg twice a day. No seizure events. Able to complete all ADLs independently. Lives at home alone. No changes in mood or behavior. No change in gait or balance. She is able to prepare her own meals. Handles her own finances without difficulty. Participates in an exercise class. Reports that she is very social. Denies any new neurological symptoms. Returns today for an evaluation.  HISTORY 05/06/15  (MM): Ms. Kent is an 85 year old female with a history of seizures. She returns today for an evaluation. The patient has remained on Keppra 250 mg twice a day. She denies any seizure events. She operates a Teacher, music without difficulty. He is able to complete all ADLs independently. Since the last visit she did have a fall while doing yardwork and unfortunately fractured her right wrist and right hip. She underwent surgery and has recovered quite nicely. She continues to live alone although now she has help with yard work. Denies any new neurological symptoms. She returns today for an evaluation.  HISTORY 03/28/14 (CD): Kayla Kent is a 85 y.o., caucasian , widowed female here as a yearly routine follow up upon initial referral from Dr. Linda Hedges for seizure versus syncope.The patient was originally sent to [facility] in 2013 for anevaluation of possible seizures. The first spell occurred in March 2013 , while in the [place].  The patient was sitting on a bench in the evening watching relatives play ball and suddenly had " this feeling of being pulled forward", her vision changed to gray, she could hear people around her -but could not respond. Her daughter witnessed the event and stated that her mother did not fall from the chair , but she threw her hands up for seconds, did not respond verbally.  The patient underwent a glucose check and electrolyte check at the ED, which were normal. Then she had a second event during a track meet ,watching her grandson run and the same spell happened to her. She recalls a woman, standing near to her, who was a nurse And checked her heart beat.  She came quickly back to normal levels of awareness . She never convulsed. She also overheard when another person in the bleachers announced she Will call 911 and she responded verbally : " no".  There was no Confusion, impairment or any other symptoms . There were  no cardiac events- after Dr. Linda Hedges evaluated the Patient.  Again, differential diagnosis included syncope. Dr. Jacelyn Grip told her that he did not establish a diagnosis , but he started her on Keppra , ( for seizures ) she worked up to 2 tablets in the morning and 2 in p.m. She had no side effects reported at the time. A MRI of the brain documented right anterior temporal encephalomalacia. EEG was non conclusive.  REVIEW OF SYSTEMS: Out of a complete 14 system review of symptoms, the patient complains only of the following symptoms, and all other reviewed systems are negative.  Frequency of urination, environmental allergies, food allergies, bruise easily, lazy eye.   ALLERGIES: Allergies  Allergen Reactions  . Chocolate Hives  . Codeine Other (See Comments)    Patient states she acts crazy  . Diazepam Other (See Comments)    Patient states she acts crazy.  . Fruit & Vegetable Daily [Nutritional Supplements] Hives and Swelling    peaches  . Iohexol Hives     Code: HIVES,  Desc: pt gets 13 hr pre-meds, Onset Date: [id number]   . Latex Hives  . Peach [Prunus Persica] Hives  . Peanut-Containing Drug Products Hives and Swelling  . Penicillins Hives, Itching and Swelling    Has patient had a PCN reaction causing immediate rash, facial/tongue/throat swelling, SOB or lightheadedness with hypotension: Yes Has patient had a PCN reaction causing severe rash involving mucus membranes or skin necrosis: No Has patient had a PCN reaction that required hospitalization No Has patient had a PCN reaction occurring within the last 10 years: No If all of the above answers are "NO", then may proceed with Cephalosporin use.   . Strawberry Extract Hives  . Sulfonamide Derivatives Hives    nausea  . Rosuvastatin Calcium   . Sulfa Antibiotics   . Sulfamethoxazole Hives and Other (See Comments)  . Crestor [Rosuvastatin Calcium] Rash  . Iodine Rash  . Wheat Other (See Comments)    Daughter states this is an  error, that she is not allergic to wheat products  . Wheat Bran Other (See Comments)    Patient family states this is an error- she is not allergic to wheat products    HOME MEDICATIONS: Outpatient Medications Prior to Visit  Medication Sig Dispense Refill  . acetaminophen (TYLENOL) 325 MG tablet Take 650 mg by mouth every 4 (four) hours as needed.    Marland Kitchen apixaban (ELIQUIS) 2.5 MG TABS tablet Take 2.5 mg by mouth 2 (two) times daily.    Marland Kitchen atorvastatin (LIPITOR) 10 MG tablet     . bethanechol (URECHOLINE) 10 MG tablet Take 1 tablet (10 mg total) by mouth 3 (three) times daily.    . bisacodyl (DULCOLAX) 10 MG suppository Place 1 suppository (10 mg total) rectally daily as needed for moderate constipation. 12 suppository 0  . Calcium Carbonate Antacid (MAALOX PO) Take by mouth as needed.    . chlorhexidine gluconate, MEDLINE KIT, (PERIDEX) 0.12 % solution 15 mLs by Mouth Rinse route 2 (two) times daily. 120 mL 0  . escitalopram (LEXAPRO) 5 MG tablet Take 1 tablet (5 mg total) by mouth daily.    . famotidine (PEPCID) 20 MG tablet Take 20 mg by mouth 2 (two) times daily.    . fluticasone (FLONASE) 50 MCG/ACT nasal spray     . Melatonin 3 MG TABS Take 0.5 tablets (1.5 mg total) by mouth at bedtime. (Patient taking differently: Take 3 mg by mouth at bedtime. )  0  . METOPROLOL TARTRATE PO Take 12.5 mg by mouth 2 (two) times daily.     . ondansetron (ZOFRAN) 4 MG tablet Take 1 tablet (4 mg total) by mouth every 6 (six) hours as needed for nausea or vomiting. 20 tablet 0  . mouth rinse LIQD solution 15 mLs by Mouth Rinse route 2 times daily at 12 noon and 4 pm.  0  . polyethylene glycol (MIRALAX / GLYCOLAX) packet Place 17 g into feeding tube 2 (two) times daily with breakfast and lunch. 14 each 0  . simethicone (MYLICON) 40 VW/0.9WJ drops Place 0.6 mLs (40 mg total) into feeding tube 4 (four) times daily as needed for flatulence. 30 mL 0  . sodium phosphate (FLEET) 7-19 GM/118ML ENEM Place 133 mLs (1  enema total) rectally daily as needed for severe constipation.  0  . trimethoprim-polymyxin b (POLYTRIM) ophthalmic solution Place 1 drop into the right eye 4 (four) times daily. 10 mL 0   No facility-administered medications prior to visit.     PAST MEDICAL HISTORY:  Past Medical History:  Diagnosis Date  . Allergic rhinitis   . Anxiety   . CKD (chronic kidney disease) stage 3, GFR 30-59 ml/min (HCC)   . Emphysema of lung ([place])   . HLD (hyperlipidemia)   . HTN (hypertension)   . Intolerance of drug    orthostatic  . Paroxysmal supraventricular tachycardia ([place])   . PVD (peripheral vascular disease) ([place])   . Seizure ([place])   . Syncope and collapse   . Uterine prolapse without mention of vaginal wall prolapse     PAST SURGICAL HISTORY: Past Surgical History:  Procedure Laterality Date  . CARDIOVERSION N/A 11/05/2014   Procedure: CARDIOVERSION;  Surgeon: Candee Furbish, MD;  Location[facility] ENDOSCOPY;  Service: Cardiovascular;  Laterality: N/A;  . cataract surgery  08/2015  . CHOLECYSTECTOMY    . corrective eye surgery     as a child  . ESOPHAGOGASTRODUODENOSCOPY N/A 09/09/2017   Procedure: ESOPHAGOGASTRODUODENOSCOPY (EGD);  Surgeon: Mauri Pole, MD;  Location[facility] ENDOSCOPY;  Service: Endoscopy;  Laterality: N/A;  . EYE SURGERY  08/2018  . INTRAMEDULLARY (IM) NAIL INTERTROCHANTERIC Left 11/21/2016   Procedure: INTRAMEDULLARY (IM) NAIL INTERTROCHANTRIC HEMI;  Surgeon: Newt Minion, MD;  Location: [place];  Service: Orthopedics;  Laterality: Left;  . IR GASTROSTOMY TUBE MOD SED  09/26/2017  . IR PERCUTANEOUS ART THROMBECTOMY/INFUSION INTRACRANIAL INC DIAG ANGIO  09/11/2017  . IVD removed    . OPEN REDUCTION INTERNAL FIXATION (ORIF) DISTAL RADIAL FRACTURE Right 08/29/2014   Procedure: OPEN REDUCTION INTERNAL FIXATION (ORIF) DISTAL RADIAL FRACTURE;  Surgeon: Marianna Payment, MD;  Location: [place];  Service: Orthopedics;  Laterality: Right;  . RADIOLOGY WITH ANESTHESIA N/A  09/11/2017   Procedure: RADIOLOGY WITH ANESTHESIA;  Surgeon: Luanne Bras, MD;  Location: [place];  Service: Radiology;  Laterality: N/A;  . RF ablation PSVT     summer '10  . stress cardiolite  08/05/93  . TONSILLECTOMY    . TOTAL HIP ARTHROPLASTY Right 08/29/2014   Procedure: Right Hip Hemi Arthroplasty;  Surgeon: Marianna Payment, MD;  Location: [place];  Service: Orthopedics;  Laterality: Right;  Hip procedure 1st wants [facility], [employer], [place].     FAMILY HISTORY: Family History  Problem Relation Age of Onset  . Mental illness Father        suicide  . Arthritis Father   . Hyperlipidemia Sister   . Hypertension Sister   . Heart disease Brother        CAD/MI  . Hypertension Brother   . Hypertension Other        family hx  . Colon cancer Neg Hx   . Breast cancer Neg Hx   . Diabetes Neg Hx   . Stroke Neg Hx   . Cancer Neg Hx   . Lung disease Neg Hx     SOCIAL HISTORY: Social History   Socioeconomic History  . Marital status: Widowed    Spouse name: Not on file  . Number of children: 2  . Years of education: 13  . Highest education level: Not on file  Occupational History  . Occupation: retired Radiation protection practitioner  . Financial resource strain: Not hard at all  . Food insecurity:    Worry: Never true    Inability: Never true  . Transportation needs:    Medical: No    Non-medical: No  Tobacco Use  . Smoking status: Former Smoker    Packs/day: 0.25    Years: 38.00  Pack years: 9.50    Start date: 02/15/1952    Last attempt to quit: 10/18/1993    Years since quitting: 25.1  . Smokeless tobacco: Never Used  Substance and Sexual Activity  . Alcohol use: No    Alcohol/week: 0.0 standard drinks  . Drug use: No  . Sexual activity: Never  Lifestyle  . Physical activity:    Days per week: 4 days    Minutes per session: 40 min  . Stress: Only a little  Relationships  . Social connections:    Talks on phone: More than three times a week     Gets together: More than three times a week    Attends religious service: 1 to 4 times per year    Active member of club or organization: Yes    Attends meetings of clubs or organizations: More than 4 times per year    Relationship status: Widowed  . Intimate partner violence:    Fear of current or ex partner: Not on file    Emotionally abused: Not on file    Physically abused: Not on file    Forced sexual activity: Not on file  Other Topics Concern  . Not on file  Social History Narrative   [employer], [employer]-BA, [employer]-early childhood. Married '55 -39 years.  1 son - 29; 1 daughter - 83; 3 grandchildren . Lives alone. ACP - discussed and provided packet on end of life care (Feb '13)   Patient is now widowed.   Patient is right-handed.   Patient drinks tea daily.      [facility] (04/26/17):   Originally from [facility]. Previously was a Pharmacist, hospital. No pets currently. No mold exposure. No bird exposure.       PHYSICAL EXAM  Vitals:   12/05/18 1017  BP: (!) 113/55  Pulse: 77  Weight: 117 lb (53.1 kg)  Height: 5' 7"  (1.702 m)   Body mass index is 18.32 kg/m.   Irregular heart rate, skipped beats.  No bruits, peripheral pulses were palpable.   Generalized:  Patient has lost weight, had 88 pounds body weight at discharge. Now 117 pounds, looking no longer pale. walks with walker,.  Neurological examination  Mentation: Alert oriented to time, place, history taking. Follows all commands - speech is comprehended well, but speech is haltered. Right sided weakness, dominant hand , arm and leg.  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  Mild  facial droop on the right. Uvula not longer in midline. Head turning and shoulder shrug  were asymmetric. Visual field intact for left eye.   Motor: The patient demonstrates a spasticity based muscle tone in her right biceps, she is able to extend and flex her arm, she  does have less movement at the wrist, her fingers are flexed at baseline and she cannot fully extend the fingers of the right hand.  She did provide good grip strength.   The same is true for the left side here without any kind of spasticity being noticed and she could perform a finger-nose-finger test without dysmetria or ataxia or tremor.    Her lower extremities are less affected, she is able to flex the right knee and can also mildly dorsiflex the right foot.  The range of motion is less meant to her unaffected side, but enough to maneuver with a walker.  Sensory: Sensory testing is intact to soft touch on all 4 extremities. No evidence of  extinction is noted.  Coordination: hemiparesis   Gait and station: Gait is normal. Reflexes:  Right hemiparesis. spasticity causing brisk reflexes on the right arm-  ipgoing babinski   DIAGNOSTIC DATA (LABS, IMAGING, TESTING) - I reviewed patient records, labs, notes, testing and imaging myself where available.  Patient has regained weight, has not been given anti-hypertensives and was 113/ 55 mmHg today. 12-05-2018.   ASSESSMENT AND PLAN 85 y.o. year old and with history of multiple medical problems, in frail health since November 2018 Seizures   1)Seizures manifesting with sudden collapse- have not returned since she takes Keppra , low dose.  She is now off medication - no more Keppra listed.  She is no longer driving.  2) stroke symptoms unchanged. 3) hypotension, unclear why- no longer on hypertension medication.  She had vaginal bleeding-  4) CKD- no recent metabolic panel.  5) atrial fib on anticoagulation.     Larey Seat, MD  12/05/2018, 10:51 AM [facility] [address] [place], [place] [address] [phone]    Cc Dorris Carnes, MD

## 2018-12-05 NOTE — Telephone Encounter (Signed)
Return call from [place], Therapist, art at [facility]. The order was written yesterday by NP to DC metoprolol and her daughter was concerned since she has been taking it for 25 years.  No progress note available yet to see why she would have recommended stopping it. Pts HR is checked with weekly vs and has been in the 70s. BP is checked daily.  Lattie Haw will fax list of readings to Dr. Harrington Challenger for review.  Pt has not missed any doses of metoprolol.  All BPs recorded have been with her on metoprolol 12.5 mg twice a day. Pt will remain on metoprolol as of now.  After Dr. Harrington Challenger reviews readings and makes recommendation we will call [place] back to update.

## 2018-12-05 NOTE — Progress Notes (Signed)
Please make sure pt has f/u in cardiology   F/U with me in march

## 2018-12-06 DIAGNOSIS — L209 Atopic dermatitis, unspecified: Secondary | ICD-10-CM | POA: Diagnosis not present

## 2018-12-06 DIAGNOSIS — R2689 Other abnormalities of gait and mobility: Secondary | ICD-10-CM | POA: Diagnosis not present

## 2018-12-06 DIAGNOSIS — I63312 Cerebral infarction due to thrombosis of left middle cerebral artery: Secondary | ICD-10-CM | POA: Diagnosis not present

## 2018-12-07 DIAGNOSIS — I63312 Cerebral infarction due to thrombosis of left middle cerebral artery: Secondary | ICD-10-CM | POA: Diagnosis not present

## 2018-12-07 DIAGNOSIS — R2689 Other abnormalities of gait and mobility: Secondary | ICD-10-CM | POA: Diagnosis not present

## 2018-12-08 DIAGNOSIS — I63312 Cerebral infarction due to thrombosis of left middle cerebral artery: Secondary | ICD-10-CM | POA: Diagnosis not present

## 2018-12-08 DIAGNOSIS — R2689 Other abnormalities of gait and mobility: Secondary | ICD-10-CM | POA: Diagnosis not present

## 2018-12-11 DIAGNOSIS — I63312 Cerebral infarction due to thrombosis of left middle cerebral artery: Secondary | ICD-10-CM | POA: Diagnosis not present

## 2018-12-11 DIAGNOSIS — R2689 Other abnormalities of gait and mobility: Secondary | ICD-10-CM | POA: Diagnosis not present

## 2018-12-12 DIAGNOSIS — R2689 Other abnormalities of gait and mobility: Secondary | ICD-10-CM | POA: Diagnosis not present

## 2018-12-12 DIAGNOSIS — I63312 Cerebral infarction due to thrombosis of left middle cerebral artery: Secondary | ICD-10-CM | POA: Diagnosis not present

## 2018-12-13 DIAGNOSIS — R2689 Other abnormalities of gait and mobility: Secondary | ICD-10-CM | POA: Diagnosis not present

## 2018-12-13 DIAGNOSIS — I63312 Cerebral infarction due to thrombosis of left middle cerebral artery: Secondary | ICD-10-CM | POA: Diagnosis not present

## 2018-12-13 NOTE — Telephone Encounter (Signed)
List of blood pressure readings received from [facility]. Will place in Dr. Alan Ripper box for her review upon return to the office.

## 2018-12-14 DIAGNOSIS — R2689 Other abnormalities of gait and mobility: Secondary | ICD-10-CM | POA: Diagnosis not present

## 2018-12-14 DIAGNOSIS — I63312 Cerebral infarction due to thrombosis of left middle cerebral artery: Secondary | ICD-10-CM | POA: Diagnosis not present

## 2018-12-14 DIAGNOSIS — I517 Cardiomegaly: Secondary | ICD-10-CM | POA: Diagnosis not present

## 2018-12-14 DIAGNOSIS — R918 Other nonspecific abnormal finding of lung field: Secondary | ICD-10-CM | POA: Diagnosis not present

## 2018-12-15 DIAGNOSIS — R2689 Other abnormalities of gait and mobility: Secondary | ICD-10-CM | POA: Diagnosis not present

## 2018-12-15 DIAGNOSIS — R918 Other nonspecific abnormal finding of lung field: Secondary | ICD-10-CM | POA: Diagnosis not present

## 2018-12-15 DIAGNOSIS — I63312 Cerebral infarction due to thrombosis of left middle cerebral artery: Secondary | ICD-10-CM | POA: Diagnosis not present

## 2018-12-18 DIAGNOSIS — R41841 Cognitive communication deficit: Secondary | ICD-10-CM | POA: Diagnosis not present

## 2018-12-18 DIAGNOSIS — M6281 Muscle weakness (generalized): Secondary | ICD-10-CM | POA: Diagnosis not present

## 2018-12-18 DIAGNOSIS — R2689 Other abnormalities of gait and mobility: Secondary | ICD-10-CM | POA: Diagnosis not present

## 2018-12-18 DIAGNOSIS — I63312 Cerebral infarction due to thrombosis of left middle cerebral artery: Secondary | ICD-10-CM | POA: Diagnosis not present

## 2018-12-18 DIAGNOSIS — R1312 Dysphagia, oropharyngeal phase: Secondary | ICD-10-CM | POA: Diagnosis not present

## 2018-12-19 DIAGNOSIS — R2689 Other abnormalities of gait and mobility: Secondary | ICD-10-CM | POA: Diagnosis not present

## 2018-12-19 DIAGNOSIS — I63312 Cerebral infarction due to thrombosis of left middle cerebral artery: Secondary | ICD-10-CM | POA: Diagnosis not present

## 2018-12-19 DIAGNOSIS — M6281 Muscle weakness (generalized): Secondary | ICD-10-CM | POA: Diagnosis not present

## 2018-12-19 DIAGNOSIS — R1312 Dysphagia, oropharyngeal phase: Secondary | ICD-10-CM | POA: Diagnosis not present

## 2018-12-19 DIAGNOSIS — R41841 Cognitive communication deficit: Secondary | ICD-10-CM | POA: Diagnosis not present

## 2018-12-27 DIAGNOSIS — R21 Rash and other nonspecific skin eruption: Secondary | ICD-10-CM | POA: Diagnosis not present

## 2019-01-01 ENCOUNTER — Telehealth: Payer: Self-pay

## 2019-01-01 NOTE — Telephone Encounter (Signed)
Kayla Kent from [place] [facility] called wanting to schedule a appointment. No new concerns or symptoms. Patient was last seen 12-05-2018. Please advise.

## 2019-01-02 DIAGNOSIS — M542 Cervicalgia: Secondary | ICD-10-CM | POA: Diagnosis not present

## 2019-01-02 DIAGNOSIS — M62838 Other muscle spasm: Secondary | ICD-10-CM | POA: Diagnosis not present

## 2019-01-02 DIAGNOSIS — M47812 Spondylosis without myelopathy or radiculopathy, cervical region: Secondary | ICD-10-CM | POA: Diagnosis not present

## 2019-01-03 DIAGNOSIS — F331 Major depressive disorder, recurrent, moderate: Secondary | ICD-10-CM | POA: Diagnosis not present

## 2019-01-03 DIAGNOSIS — M953 Acquired deformity of neck: Secondary | ICD-10-CM | POA: Diagnosis not present

## 2019-01-03 DIAGNOSIS — M62838 Other muscle spasm: Secondary | ICD-10-CM | POA: Diagnosis not present

## 2019-01-03 DIAGNOSIS — M4802 Spinal stenosis, cervical region: Secondary | ICD-10-CM | POA: Diagnosis not present

## 2019-01-03 DIAGNOSIS — G4701 Insomnia due to medical condition: Secondary | ICD-10-CM | POA: Diagnosis not present

## 2019-01-04 DIAGNOSIS — I69351 Hemiplegia and hemiparesis following cerebral infarction affecting right dominant side: Secondary | ICD-10-CM | POA: Diagnosis not present

## 2019-01-04 DIAGNOSIS — M542 Cervicalgia: Secondary | ICD-10-CM | POA: Diagnosis not present

## 2019-01-04 DIAGNOSIS — E785 Hyperlipidemia, unspecified: Secondary | ICD-10-CM | POA: Diagnosis not present

## 2019-01-04 DIAGNOSIS — I69322 Dysarthria following cerebral infarction: Secondary | ICD-10-CM | POA: Diagnosis not present

## 2019-01-04 DIAGNOSIS — R54 Age-related physical debility: Secondary | ICD-10-CM | POA: Diagnosis not present

## 2019-01-04 DIAGNOSIS — E440 Moderate protein-calorie malnutrition: Secondary | ICD-10-CM | POA: Diagnosis not present

## 2019-01-04 DIAGNOSIS — I69391 Dysphagia following cerebral infarction: Secondary | ICD-10-CM | POA: Diagnosis not present

## 2019-01-04 DIAGNOSIS — R32 Unspecified urinary incontinence: Secondary | ICD-10-CM | POA: Diagnosis not present

## 2019-01-04 DIAGNOSIS — J449 Chronic obstructive pulmonary disease, unspecified: Secondary | ICD-10-CM | POA: Diagnosis not present

## 2019-01-04 DIAGNOSIS — Z85118 Personal history of other malignant neoplasm of bronchus and lung: Secondary | ICD-10-CM | POA: Diagnosis not present

## 2019-01-04 DIAGNOSIS — J309 Allergic rhinitis, unspecified: Secondary | ICD-10-CM | POA: Diagnosis not present

## 2019-01-04 DIAGNOSIS — I4891 Unspecified atrial fibrillation: Secondary | ICD-10-CM | POA: Diagnosis not present

## 2019-01-08 ENCOUNTER — Other Ambulatory Visit: Payer: Self-pay | Admitting: Internal Medicine

## 2019-01-10 DIAGNOSIS — G4701 Insomnia due to medical condition: Secondary | ICD-10-CM | POA: Diagnosis not present

## 2019-01-10 DIAGNOSIS — F419 Anxiety disorder, unspecified: Secondary | ICD-10-CM | POA: Diagnosis not present

## 2019-01-10 DIAGNOSIS — F331 Major depressive disorder, recurrent, moderate: Secondary | ICD-10-CM | POA: Diagnosis not present

## 2019-01-11 DIAGNOSIS — R2689 Other abnormalities of gait and mobility: Secondary | ICD-10-CM | POA: Diagnosis not present

## 2019-01-11 DIAGNOSIS — R1312 Dysphagia, oropharyngeal phase: Secondary | ICD-10-CM | POA: Diagnosis not present

## 2019-01-11 DIAGNOSIS — I63312 Cerebral infarction due to thrombosis of left middle cerebral artery: Secondary | ICD-10-CM | POA: Diagnosis not present

## 2019-01-11 DIAGNOSIS — M6281 Muscle weakness (generalized): Secondary | ICD-10-CM | POA: Diagnosis not present

## 2019-01-11 DIAGNOSIS — R41841 Cognitive communication deficit: Secondary | ICD-10-CM | POA: Diagnosis not present

## 2019-01-12 DIAGNOSIS — R2689 Other abnormalities of gait and mobility: Secondary | ICD-10-CM | POA: Diagnosis not present

## 2019-01-12 DIAGNOSIS — R41841 Cognitive communication deficit: Secondary | ICD-10-CM | POA: Diagnosis not present

## 2019-01-12 DIAGNOSIS — I63312 Cerebral infarction due to thrombosis of left middle cerebral artery: Secondary | ICD-10-CM | POA: Diagnosis not present

## 2019-01-12 DIAGNOSIS — R1312 Dysphagia, oropharyngeal phase: Secondary | ICD-10-CM | POA: Diagnosis not present

## 2019-01-12 DIAGNOSIS — M6281 Muscle weakness (generalized): Secondary | ICD-10-CM | POA: Diagnosis not present

## 2019-01-13 DIAGNOSIS — M6281 Muscle weakness (generalized): Secondary | ICD-10-CM | POA: Diagnosis not present

## 2019-01-13 DIAGNOSIS — E559 Vitamin D deficiency, unspecified: Secondary | ICD-10-CM | POA: Diagnosis not present

## 2019-01-15 ENCOUNTER — Other Ambulatory Visit: Payer: Self-pay | Admitting: Nurse Practitioner

## 2019-01-15 DIAGNOSIS — R2689 Other abnormalities of gait and mobility: Secondary | ICD-10-CM | POA: Diagnosis not present

## 2019-01-15 DIAGNOSIS — R1312 Dysphagia, oropharyngeal phase: Secondary | ICD-10-CM | POA: Diagnosis not present

## 2019-01-15 DIAGNOSIS — R41841 Cognitive communication deficit: Secondary | ICD-10-CM | POA: Diagnosis not present

## 2019-01-15 DIAGNOSIS — M6281 Muscle weakness (generalized): Secondary | ICD-10-CM | POA: Diagnosis not present

## 2019-01-15 DIAGNOSIS — M542 Cervicalgia: Secondary | ICD-10-CM

## 2019-01-15 DIAGNOSIS — I63312 Cerebral infarction due to thrombosis of left middle cerebral artery: Secondary | ICD-10-CM | POA: Diagnosis not present

## 2019-01-16 DIAGNOSIS — R1312 Dysphagia, oropharyngeal phase: Secondary | ICD-10-CM | POA: Diagnosis not present

## 2019-01-16 DIAGNOSIS — I63312 Cerebral infarction due to thrombosis of left middle cerebral artery: Secondary | ICD-10-CM | POA: Diagnosis not present

## 2019-01-16 DIAGNOSIS — R2689 Other abnormalities of gait and mobility: Secondary | ICD-10-CM | POA: Diagnosis not present

## 2019-01-16 DIAGNOSIS — M6281 Muscle weakness (generalized): Secondary | ICD-10-CM | POA: Diagnosis not present

## 2019-01-16 DIAGNOSIS — R41841 Cognitive communication deficit: Secondary | ICD-10-CM | POA: Diagnosis not present

## 2019-01-17 DIAGNOSIS — I63312 Cerebral infarction due to thrombosis of left middle cerebral artery: Secondary | ICD-10-CM | POA: Diagnosis not present

## 2019-01-17 DIAGNOSIS — R41841 Cognitive communication deficit: Secondary | ICD-10-CM | POA: Diagnosis not present

## 2019-01-17 DIAGNOSIS — R1312 Dysphagia, oropharyngeal phase: Secondary | ICD-10-CM | POA: Diagnosis not present

## 2019-01-17 DIAGNOSIS — R2689 Other abnormalities of gait and mobility: Secondary | ICD-10-CM | POA: Diagnosis not present

## 2019-01-17 DIAGNOSIS — M6281 Muscle weakness (generalized): Secondary | ICD-10-CM | POA: Diagnosis not present

## 2019-01-17 DIAGNOSIS — R2681 Unsteadiness on feet: Secondary | ICD-10-CM | POA: Diagnosis not present

## 2019-01-18 DIAGNOSIS — R1312 Dysphagia, oropharyngeal phase: Secondary | ICD-10-CM | POA: Diagnosis not present

## 2019-01-18 DIAGNOSIS — R2689 Other abnormalities of gait and mobility: Secondary | ICD-10-CM | POA: Diagnosis not present

## 2019-01-18 DIAGNOSIS — M6281 Muscle weakness (generalized): Secondary | ICD-10-CM | POA: Diagnosis not present

## 2019-01-18 DIAGNOSIS — R2681 Unsteadiness on feet: Secondary | ICD-10-CM | POA: Diagnosis not present

## 2019-01-18 DIAGNOSIS — R41841 Cognitive communication deficit: Secondary | ICD-10-CM | POA: Diagnosis not present

## 2019-01-18 DIAGNOSIS — I63312 Cerebral infarction due to thrombosis of left middle cerebral artery: Secondary | ICD-10-CM | POA: Diagnosis not present

## 2019-01-19 DIAGNOSIS — M6281 Muscle weakness (generalized): Secondary | ICD-10-CM | POA: Diagnosis not present

## 2019-01-19 DIAGNOSIS — I63312 Cerebral infarction due to thrombosis of left middle cerebral artery: Secondary | ICD-10-CM | POA: Diagnosis not present

## 2019-01-19 DIAGNOSIS — R2681 Unsteadiness on feet: Secondary | ICD-10-CM | POA: Diagnosis not present

## 2019-01-19 DIAGNOSIS — R2689 Other abnormalities of gait and mobility: Secondary | ICD-10-CM | POA: Diagnosis not present

## 2019-01-19 DIAGNOSIS — R1312 Dysphagia, oropharyngeal phase: Secondary | ICD-10-CM | POA: Diagnosis not present

## 2019-01-19 DIAGNOSIS — R41841 Cognitive communication deficit: Secondary | ICD-10-CM | POA: Diagnosis not present

## 2019-01-22 DIAGNOSIS — I69391 Dysphagia following cerebral infarction: Secondary | ICD-10-CM | POA: Diagnosis not present

## 2019-01-22 DIAGNOSIS — M6281 Muscle weakness (generalized): Secondary | ICD-10-CM | POA: Diagnosis not present

## 2019-01-22 DIAGNOSIS — R2689 Other abnormalities of gait and mobility: Secondary | ICD-10-CM | POA: Diagnosis not present

## 2019-01-22 DIAGNOSIS — E440 Moderate protein-calorie malnutrition: Secondary | ICD-10-CM | POA: Diagnosis not present

## 2019-01-22 DIAGNOSIS — R2681 Unsteadiness on feet: Secondary | ICD-10-CM | POA: Diagnosis not present

## 2019-01-22 DIAGNOSIS — M542 Cervicalgia: Secondary | ICD-10-CM | POA: Diagnosis not present

## 2019-01-22 DIAGNOSIS — Z85118 Personal history of other malignant neoplasm of bronchus and lung: Secondary | ICD-10-CM | POA: Diagnosis not present

## 2019-01-22 DIAGNOSIS — I4891 Unspecified atrial fibrillation: Secondary | ICD-10-CM | POA: Diagnosis not present

## 2019-01-22 DIAGNOSIS — I63312 Cerebral infarction due to thrombosis of left middle cerebral artery: Secondary | ICD-10-CM | POA: Diagnosis not present

## 2019-01-22 DIAGNOSIS — R21 Rash and other nonspecific skin eruption: Secondary | ICD-10-CM | POA: Diagnosis not present

## 2019-01-22 DIAGNOSIS — J309 Allergic rhinitis, unspecified: Secondary | ICD-10-CM | POA: Diagnosis not present

## 2019-01-22 DIAGNOSIS — I69351 Hemiplegia and hemiparesis following cerebral infarction affecting right dominant side: Secondary | ICD-10-CM | POA: Diagnosis not present

## 2019-01-22 DIAGNOSIS — R32 Unspecified urinary incontinence: Secondary | ICD-10-CM | POA: Diagnosis not present

## 2019-01-22 DIAGNOSIS — R54 Age-related physical debility: Secondary | ICD-10-CM | POA: Diagnosis not present

## 2019-01-22 DIAGNOSIS — R1312 Dysphagia, oropharyngeal phase: Secondary | ICD-10-CM | POA: Diagnosis not present

## 2019-01-22 DIAGNOSIS — I69322 Dysarthria following cerebral infarction: Secondary | ICD-10-CM | POA: Diagnosis not present

## 2019-01-22 DIAGNOSIS — E785 Hyperlipidemia, unspecified: Secondary | ICD-10-CM | POA: Diagnosis not present

## 2019-01-22 DIAGNOSIS — R41841 Cognitive communication deficit: Secondary | ICD-10-CM | POA: Diagnosis not present

## 2019-01-23 DIAGNOSIS — R2681 Unsteadiness on feet: Secondary | ICD-10-CM | POA: Diagnosis not present

## 2019-01-23 DIAGNOSIS — I63312 Cerebral infarction due to thrombosis of left middle cerebral artery: Secondary | ICD-10-CM | POA: Diagnosis not present

## 2019-01-23 DIAGNOSIS — R1312 Dysphagia, oropharyngeal phase: Secondary | ICD-10-CM | POA: Diagnosis not present

## 2019-01-23 DIAGNOSIS — R2689 Other abnormalities of gait and mobility: Secondary | ICD-10-CM | POA: Diagnosis not present

## 2019-01-23 DIAGNOSIS — M6281 Muscle weakness (generalized): Secondary | ICD-10-CM | POA: Diagnosis not present

## 2019-01-23 DIAGNOSIS — R41841 Cognitive communication deficit: Secondary | ICD-10-CM | POA: Diagnosis not present

## 2019-01-24 DIAGNOSIS — M6281 Muscle weakness (generalized): Secondary | ICD-10-CM | POA: Diagnosis not present

## 2019-01-24 DIAGNOSIS — I63312 Cerebral infarction due to thrombosis of left middle cerebral artery: Secondary | ICD-10-CM | POA: Diagnosis not present

## 2019-01-24 DIAGNOSIS — R41841 Cognitive communication deficit: Secondary | ICD-10-CM | POA: Diagnosis not present

## 2019-01-24 DIAGNOSIS — R2681 Unsteadiness on feet: Secondary | ICD-10-CM | POA: Diagnosis not present

## 2019-01-24 DIAGNOSIS — F331 Major depressive disorder, recurrent, moderate: Secondary | ICD-10-CM | POA: Diagnosis not present

## 2019-01-24 DIAGNOSIS — F419 Anxiety disorder, unspecified: Secondary | ICD-10-CM | POA: Diagnosis not present

## 2019-01-24 DIAGNOSIS — G4701 Insomnia due to medical condition: Secondary | ICD-10-CM | POA: Diagnosis not present

## 2019-01-24 DIAGNOSIS — R2689 Other abnormalities of gait and mobility: Secondary | ICD-10-CM | POA: Diagnosis not present

## 2019-01-24 DIAGNOSIS — R1312 Dysphagia, oropharyngeal phase: Secondary | ICD-10-CM | POA: Diagnosis not present

## 2019-01-25 DIAGNOSIS — R41841 Cognitive communication deficit: Secondary | ICD-10-CM | POA: Diagnosis not present

## 2019-01-25 DIAGNOSIS — R2681 Unsteadiness on feet: Secondary | ICD-10-CM | POA: Diagnosis not present

## 2019-01-25 DIAGNOSIS — I63312 Cerebral infarction due to thrombosis of left middle cerebral artery: Secondary | ICD-10-CM | POA: Diagnosis not present

## 2019-01-25 DIAGNOSIS — R2689 Other abnormalities of gait and mobility: Secondary | ICD-10-CM | POA: Diagnosis not present

## 2019-01-25 DIAGNOSIS — M6281 Muscle weakness (generalized): Secondary | ICD-10-CM | POA: Diagnosis not present

## 2019-01-25 DIAGNOSIS — R1312 Dysphagia, oropharyngeal phase: Secondary | ICD-10-CM | POA: Diagnosis not present

## 2019-01-26 DIAGNOSIS — I63312 Cerebral infarction due to thrombosis of left middle cerebral artery: Secondary | ICD-10-CM | POA: Diagnosis not present

## 2019-01-26 DIAGNOSIS — R41841 Cognitive communication deficit: Secondary | ICD-10-CM | POA: Diagnosis not present

## 2019-01-26 DIAGNOSIS — R2689 Other abnormalities of gait and mobility: Secondary | ICD-10-CM | POA: Diagnosis not present

## 2019-01-26 DIAGNOSIS — R2681 Unsteadiness on feet: Secondary | ICD-10-CM | POA: Diagnosis not present

## 2019-01-26 DIAGNOSIS — M6281 Muscle weakness (generalized): Secondary | ICD-10-CM | POA: Diagnosis not present

## 2019-01-26 DIAGNOSIS — R1312 Dysphagia, oropharyngeal phase: Secondary | ICD-10-CM | POA: Diagnosis not present

## 2019-01-29 ENCOUNTER — Ambulatory Visit: Payer: Self-pay | Admitting: Internal Medicine

## 2019-01-29 DIAGNOSIS — R2681 Unsteadiness on feet: Secondary | ICD-10-CM | POA: Diagnosis not present

## 2019-01-29 DIAGNOSIS — R1312 Dysphagia, oropharyngeal phase: Secondary | ICD-10-CM | POA: Diagnosis not present

## 2019-01-29 DIAGNOSIS — R41841 Cognitive communication deficit: Secondary | ICD-10-CM | POA: Diagnosis not present

## 2019-01-29 DIAGNOSIS — R2689 Other abnormalities of gait and mobility: Secondary | ICD-10-CM | POA: Diagnosis not present

## 2019-01-29 DIAGNOSIS — I63312 Cerebral infarction due to thrombosis of left middle cerebral artery: Secondary | ICD-10-CM | POA: Diagnosis not present

## 2019-01-29 DIAGNOSIS — M6281 Muscle weakness (generalized): Secondary | ICD-10-CM | POA: Diagnosis not present

## 2019-01-30 DIAGNOSIS — R1312 Dysphagia, oropharyngeal phase: Secondary | ICD-10-CM | POA: Diagnosis not present

## 2019-01-30 DIAGNOSIS — R2689 Other abnormalities of gait and mobility: Secondary | ICD-10-CM | POA: Diagnosis not present

## 2019-01-30 DIAGNOSIS — R2681 Unsteadiness on feet: Secondary | ICD-10-CM | POA: Diagnosis not present

## 2019-01-30 DIAGNOSIS — I63312 Cerebral infarction due to thrombosis of left middle cerebral artery: Secondary | ICD-10-CM | POA: Diagnosis not present

## 2019-01-30 DIAGNOSIS — R41841 Cognitive communication deficit: Secondary | ICD-10-CM | POA: Diagnosis not present

## 2019-01-30 DIAGNOSIS — M6281 Muscle weakness (generalized): Secondary | ICD-10-CM | POA: Diagnosis not present

## 2019-01-31 DIAGNOSIS — R41841 Cognitive communication deficit: Secondary | ICD-10-CM | POA: Diagnosis not present

## 2019-01-31 DIAGNOSIS — R2681 Unsteadiness on feet: Secondary | ICD-10-CM | POA: Diagnosis not present

## 2019-01-31 DIAGNOSIS — M6281 Muscle weakness (generalized): Secondary | ICD-10-CM | POA: Diagnosis not present

## 2019-01-31 DIAGNOSIS — R1312 Dysphagia, oropharyngeal phase: Secondary | ICD-10-CM | POA: Diagnosis not present

## 2019-01-31 DIAGNOSIS — R2689 Other abnormalities of gait and mobility: Secondary | ICD-10-CM | POA: Diagnosis not present

## 2019-01-31 DIAGNOSIS — I63312 Cerebral infarction due to thrombosis of left middle cerebral artery: Secondary | ICD-10-CM | POA: Diagnosis not present

## 2019-02-01 DIAGNOSIS — I63312 Cerebral infarction due to thrombosis of left middle cerebral artery: Secondary | ICD-10-CM | POA: Diagnosis not present

## 2019-02-01 DIAGNOSIS — M6281 Muscle weakness (generalized): Secondary | ICD-10-CM | POA: Diagnosis not present

## 2019-02-01 DIAGNOSIS — R41841 Cognitive communication deficit: Secondary | ICD-10-CM | POA: Diagnosis not present

## 2019-02-01 DIAGNOSIS — R1312 Dysphagia, oropharyngeal phase: Secondary | ICD-10-CM | POA: Diagnosis not present

## 2019-02-01 DIAGNOSIS — R2681 Unsteadiness on feet: Secondary | ICD-10-CM | POA: Diagnosis not present

## 2019-02-01 DIAGNOSIS — R2689 Other abnormalities of gait and mobility: Secondary | ICD-10-CM | POA: Diagnosis not present

## 2019-02-02 DIAGNOSIS — R41841 Cognitive communication deficit: Secondary | ICD-10-CM | POA: Diagnosis not present

## 2019-02-02 DIAGNOSIS — R2681 Unsteadiness on feet: Secondary | ICD-10-CM | POA: Diagnosis not present

## 2019-02-02 DIAGNOSIS — I63312 Cerebral infarction due to thrombosis of left middle cerebral artery: Secondary | ICD-10-CM | POA: Diagnosis not present

## 2019-02-02 DIAGNOSIS — M6281 Muscle weakness (generalized): Secondary | ICD-10-CM | POA: Diagnosis not present

## 2019-02-02 DIAGNOSIS — R2689 Other abnormalities of gait and mobility: Secondary | ICD-10-CM | POA: Diagnosis not present

## 2019-02-02 DIAGNOSIS — R1312 Dysphagia, oropharyngeal phase: Secondary | ICD-10-CM | POA: Diagnosis not present

## 2019-02-04 DIAGNOSIS — I63312 Cerebral infarction due to thrombosis of left middle cerebral artery: Secondary | ICD-10-CM | POA: Diagnosis not present

## 2019-02-04 DIAGNOSIS — R41841 Cognitive communication deficit: Secondary | ICD-10-CM | POA: Diagnosis not present

## 2019-02-04 DIAGNOSIS — R2689 Other abnormalities of gait and mobility: Secondary | ICD-10-CM | POA: Diagnosis not present

## 2019-02-04 DIAGNOSIS — R1312 Dysphagia, oropharyngeal phase: Secondary | ICD-10-CM | POA: Diagnosis not present

## 2019-02-04 DIAGNOSIS — R2681 Unsteadiness on feet: Secondary | ICD-10-CM | POA: Diagnosis not present

## 2019-02-04 DIAGNOSIS — M6281 Muscle weakness (generalized): Secondary | ICD-10-CM | POA: Diagnosis not present

## 2019-02-05 DIAGNOSIS — R1312 Dysphagia, oropharyngeal phase: Secondary | ICD-10-CM | POA: Diagnosis not present

## 2019-02-05 DIAGNOSIS — R2689 Other abnormalities of gait and mobility: Secondary | ICD-10-CM | POA: Diagnosis not present

## 2019-02-05 DIAGNOSIS — M6281 Muscle weakness (generalized): Secondary | ICD-10-CM | POA: Diagnosis not present

## 2019-02-05 DIAGNOSIS — I63312 Cerebral infarction due to thrombosis of left middle cerebral artery: Secondary | ICD-10-CM | POA: Diagnosis not present

## 2019-02-05 DIAGNOSIS — R2681 Unsteadiness on feet: Secondary | ICD-10-CM | POA: Diagnosis not present

## 2019-02-05 DIAGNOSIS — R41841 Cognitive communication deficit: Secondary | ICD-10-CM | POA: Diagnosis not present

## 2019-02-06 DIAGNOSIS — R2681 Unsteadiness on feet: Secondary | ICD-10-CM | POA: Diagnosis not present

## 2019-02-06 DIAGNOSIS — I63312 Cerebral infarction due to thrombosis of left middle cerebral artery: Secondary | ICD-10-CM | POA: Diagnosis not present

## 2019-02-06 DIAGNOSIS — R2689 Other abnormalities of gait and mobility: Secondary | ICD-10-CM | POA: Diagnosis not present

## 2019-02-06 DIAGNOSIS — R41841 Cognitive communication deficit: Secondary | ICD-10-CM | POA: Diagnosis not present

## 2019-02-06 DIAGNOSIS — M6281 Muscle weakness (generalized): Secondary | ICD-10-CM | POA: Diagnosis not present

## 2019-02-06 DIAGNOSIS — R1312 Dysphagia, oropharyngeal phase: Secondary | ICD-10-CM | POA: Diagnosis not present

## 2019-02-07 DIAGNOSIS — R2681 Unsteadiness on feet: Secondary | ICD-10-CM | POA: Diagnosis not present

## 2019-02-07 DIAGNOSIS — R1312 Dysphagia, oropharyngeal phase: Secondary | ICD-10-CM | POA: Diagnosis not present

## 2019-02-07 DIAGNOSIS — I63312 Cerebral infarction due to thrombosis of left middle cerebral artery: Secondary | ICD-10-CM | POA: Diagnosis not present

## 2019-02-07 DIAGNOSIS — R41841 Cognitive communication deficit: Secondary | ICD-10-CM | POA: Diagnosis not present

## 2019-02-07 DIAGNOSIS — F419 Anxiety disorder, unspecified: Secondary | ICD-10-CM | POA: Diagnosis not present

## 2019-02-07 DIAGNOSIS — M6281 Muscle weakness (generalized): Secondary | ICD-10-CM | POA: Diagnosis not present

## 2019-02-07 DIAGNOSIS — R2689 Other abnormalities of gait and mobility: Secondary | ICD-10-CM | POA: Diagnosis not present

## 2019-02-08 DIAGNOSIS — M6281 Muscle weakness (generalized): Secondary | ICD-10-CM | POA: Diagnosis not present

## 2019-02-08 DIAGNOSIS — R1312 Dysphagia, oropharyngeal phase: Secondary | ICD-10-CM | POA: Diagnosis not present

## 2019-02-08 DIAGNOSIS — R2689 Other abnormalities of gait and mobility: Secondary | ICD-10-CM | POA: Diagnosis not present

## 2019-02-08 DIAGNOSIS — R41841 Cognitive communication deficit: Secondary | ICD-10-CM | POA: Diagnosis not present

## 2019-02-08 DIAGNOSIS — I63312 Cerebral infarction due to thrombosis of left middle cerebral artery: Secondary | ICD-10-CM | POA: Diagnosis not present

## 2019-02-08 DIAGNOSIS — R2681 Unsteadiness on feet: Secondary | ICD-10-CM | POA: Diagnosis not present

## 2019-02-09 DIAGNOSIS — R2681 Unsteadiness on feet: Secondary | ICD-10-CM | POA: Diagnosis not present

## 2019-02-09 DIAGNOSIS — R41841 Cognitive communication deficit: Secondary | ICD-10-CM | POA: Diagnosis not present

## 2019-02-09 DIAGNOSIS — M6281 Muscle weakness (generalized): Secondary | ICD-10-CM | POA: Diagnosis not present

## 2019-02-09 DIAGNOSIS — I63312 Cerebral infarction due to thrombosis of left middle cerebral artery: Secondary | ICD-10-CM | POA: Diagnosis not present

## 2019-02-09 DIAGNOSIS — R1312 Dysphagia, oropharyngeal phase: Secondary | ICD-10-CM | POA: Diagnosis not present

## 2019-02-09 DIAGNOSIS — R2689 Other abnormalities of gait and mobility: Secondary | ICD-10-CM | POA: Diagnosis not present

## 2019-02-12 DIAGNOSIS — I63312 Cerebral infarction due to thrombosis of left middle cerebral artery: Secondary | ICD-10-CM | POA: Diagnosis not present

## 2019-02-12 DIAGNOSIS — R41841 Cognitive communication deficit: Secondary | ICD-10-CM | POA: Diagnosis not present

## 2019-02-12 DIAGNOSIS — R1312 Dysphagia, oropharyngeal phase: Secondary | ICD-10-CM | POA: Diagnosis not present

## 2019-02-12 DIAGNOSIS — R2681 Unsteadiness on feet: Secondary | ICD-10-CM | POA: Diagnosis not present

## 2019-02-12 DIAGNOSIS — M6281 Muscle weakness (generalized): Secondary | ICD-10-CM | POA: Diagnosis not present

## 2019-02-12 DIAGNOSIS — R2689 Other abnormalities of gait and mobility: Secondary | ICD-10-CM | POA: Diagnosis not present

## 2019-02-13 DIAGNOSIS — I63312 Cerebral infarction due to thrombosis of left middle cerebral artery: Secondary | ICD-10-CM | POA: Diagnosis not present

## 2019-02-13 DIAGNOSIS — R41841 Cognitive communication deficit: Secondary | ICD-10-CM | POA: Diagnosis not present

## 2019-02-13 DIAGNOSIS — R2689 Other abnormalities of gait and mobility: Secondary | ICD-10-CM | POA: Diagnosis not present

## 2019-02-13 DIAGNOSIS — M6281 Muscle weakness (generalized): Secondary | ICD-10-CM | POA: Diagnosis not present

## 2019-02-13 DIAGNOSIS — R2681 Unsteadiness on feet: Secondary | ICD-10-CM | POA: Diagnosis not present

## 2019-02-13 DIAGNOSIS — R1312 Dysphagia, oropharyngeal phase: Secondary | ICD-10-CM | POA: Diagnosis not present

## 2019-02-14 DIAGNOSIS — I63312 Cerebral infarction due to thrombosis of left middle cerebral artery: Secondary | ICD-10-CM | POA: Diagnosis not present

## 2019-02-14 DIAGNOSIS — R41841 Cognitive communication deficit: Secondary | ICD-10-CM | POA: Diagnosis not present

## 2019-02-14 DIAGNOSIS — R2689 Other abnormalities of gait and mobility: Secondary | ICD-10-CM | POA: Diagnosis not present

## 2019-02-14 DIAGNOSIS — R1312 Dysphagia, oropharyngeal phase: Secondary | ICD-10-CM | POA: Diagnosis not present

## 2019-02-14 DIAGNOSIS — M6281 Muscle weakness (generalized): Secondary | ICD-10-CM | POA: Diagnosis not present

## 2019-02-14 DIAGNOSIS — R2681 Unsteadiness on feet: Secondary | ICD-10-CM | POA: Diagnosis not present

## 2019-02-15 DIAGNOSIS — M6281 Muscle weakness (generalized): Secondary | ICD-10-CM | POA: Diagnosis not present

## 2019-02-15 DIAGNOSIS — I63312 Cerebral infarction due to thrombosis of left middle cerebral artery: Secondary | ICD-10-CM | POA: Diagnosis not present

## 2019-02-15 DIAGNOSIS — R2681 Unsteadiness on feet: Secondary | ICD-10-CM | POA: Diagnosis not present

## 2019-02-15 DIAGNOSIS — R2689 Other abnormalities of gait and mobility: Secondary | ICD-10-CM | POA: Diagnosis not present

## 2019-02-15 DIAGNOSIS — R41841 Cognitive communication deficit: Secondary | ICD-10-CM | POA: Diagnosis not present

## 2019-02-15 DIAGNOSIS — R1312 Dysphagia, oropharyngeal phase: Secondary | ICD-10-CM | POA: Diagnosis not present

## 2019-02-16 DIAGNOSIS — R1312 Dysphagia, oropharyngeal phase: Secondary | ICD-10-CM | POA: Diagnosis not present

## 2019-02-16 DIAGNOSIS — R2681 Unsteadiness on feet: Secondary | ICD-10-CM | POA: Diagnosis not present

## 2019-02-16 DIAGNOSIS — M6281 Muscle weakness (generalized): Secondary | ICD-10-CM | POA: Diagnosis not present

## 2019-02-16 DIAGNOSIS — I63312 Cerebral infarction due to thrombosis of left middle cerebral artery: Secondary | ICD-10-CM | POA: Diagnosis not present

## 2019-02-16 DIAGNOSIS — R41841 Cognitive communication deficit: Secondary | ICD-10-CM | POA: Diagnosis not present

## 2019-02-16 DIAGNOSIS — R2689 Other abnormalities of gait and mobility: Secondary | ICD-10-CM | POA: Diagnosis not present

## 2019-02-17 IMAGING — CT CT HEAD W/O CM
4 series · 15 of 47 positions shown, 17 images · non-contrast
Comparison: April 06, 2017 head CT; brain MRI September 23, 2014

CLINICAL DATA: Altered mental status with questionable fall.
Slurred speech.

EXAM:
CT HEAD WITHOUT CONTRAST
TECHNIQUE: Contiguous axial images were obtained from the base of the skull
through the vertex without intravenous contrast.

[Series 3: head without · axial · non-contrast · 0.44mm/px · z∈[-92,+28]mm · 7 of 33 slices shown, 9 images]
[im 5/33  brain]
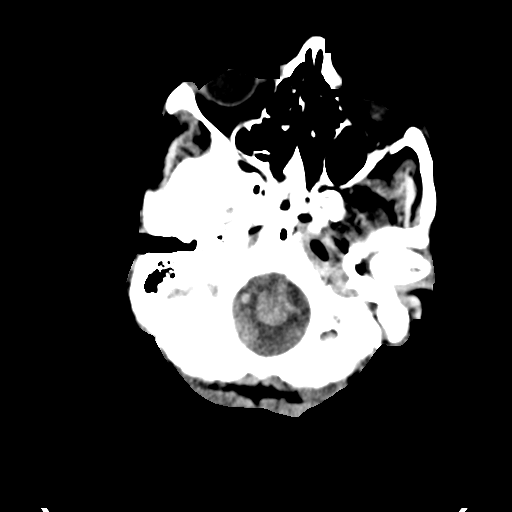
[im 5/33  bone]
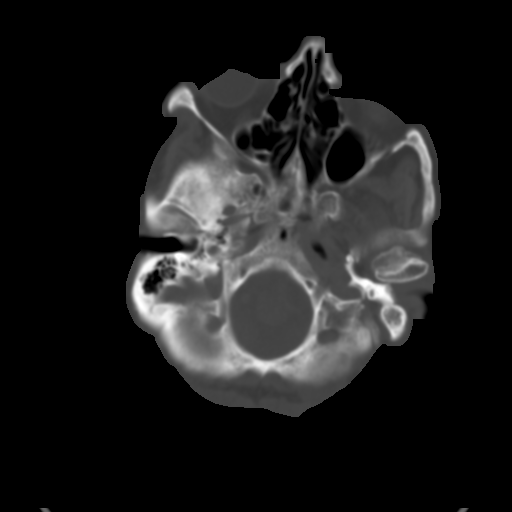
[im 9/33  brain]
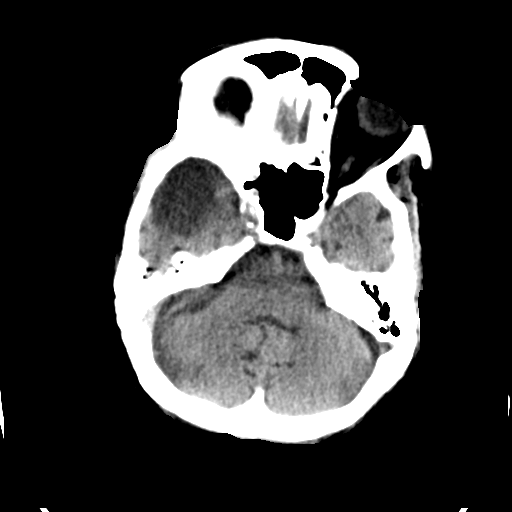
[im 13/33  brain]
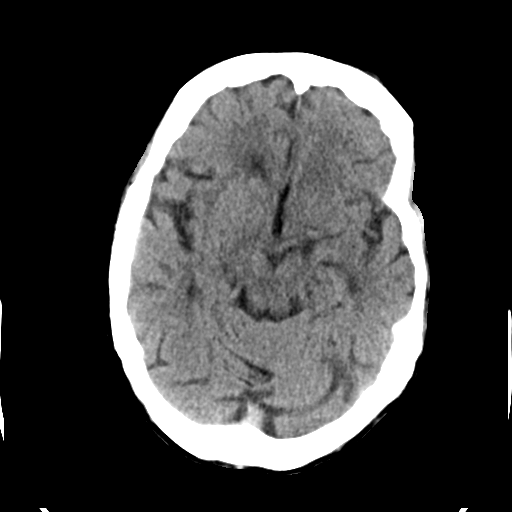
[im 17/33  brain]
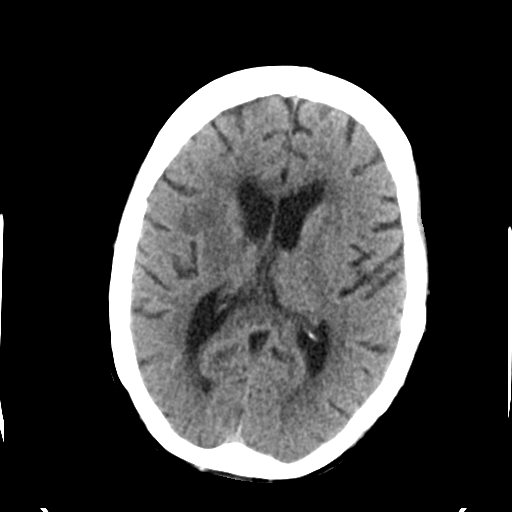
[im 21/33  brain]
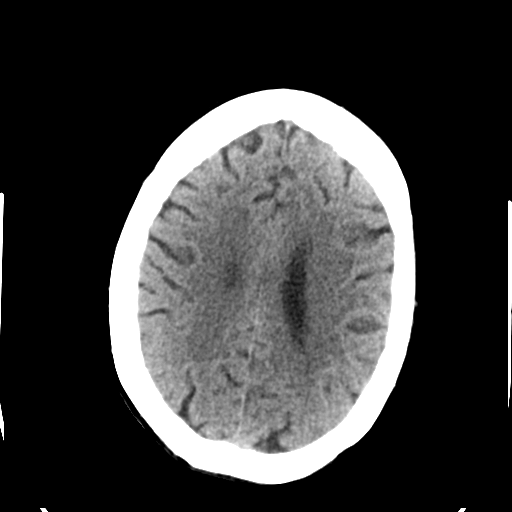
[im 21/33  bone]
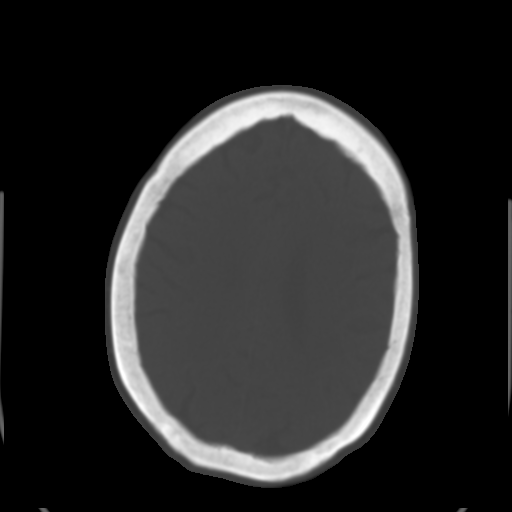
[im 25/33  brain]
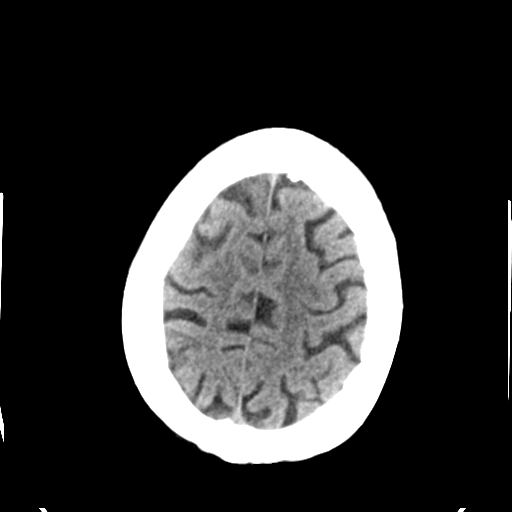
[im 29/33  brain]
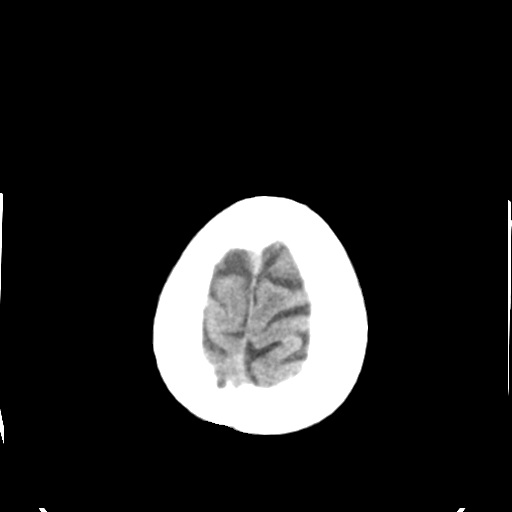

[Series 4: head bone · axial · 0.44mm/px · z∈[-96,-80]mm · 2 of 81 slices shown]
[im 9/81  bone]
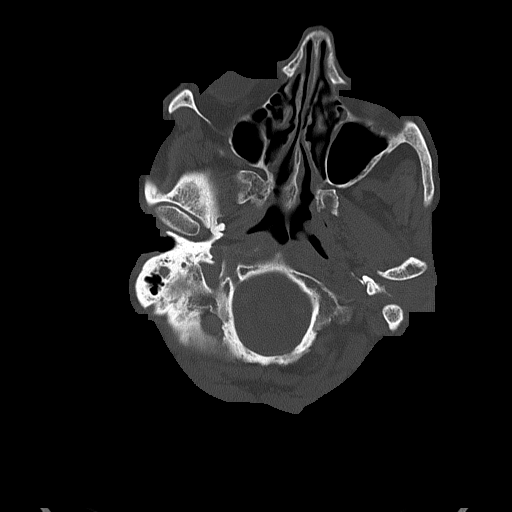
[im 17/81  bone]
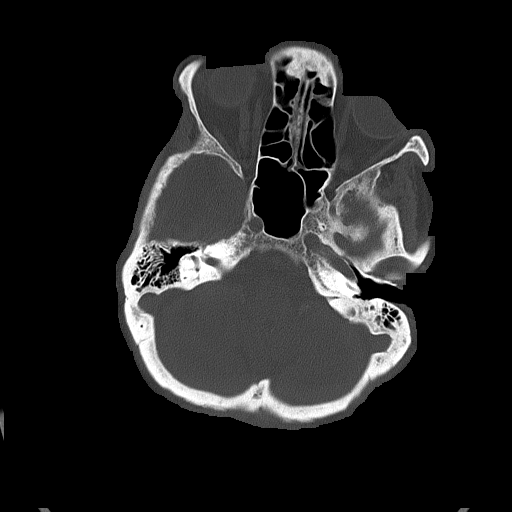

[Series 5: head without cor · coronal · non-contrast · 0.31mm/px · 3 of 71 slices shown]
[im 24/71  brain]
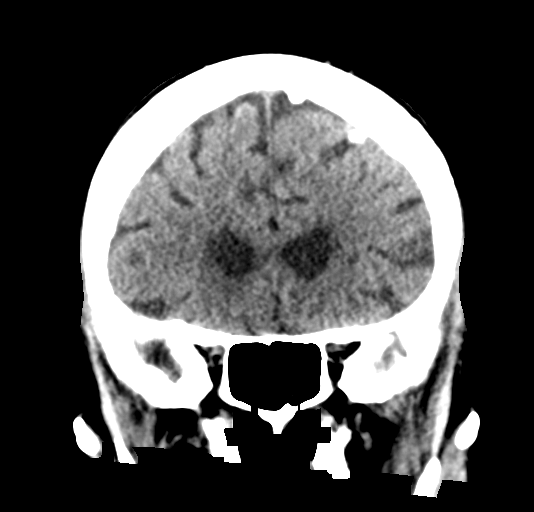
[im 32/71  brain]
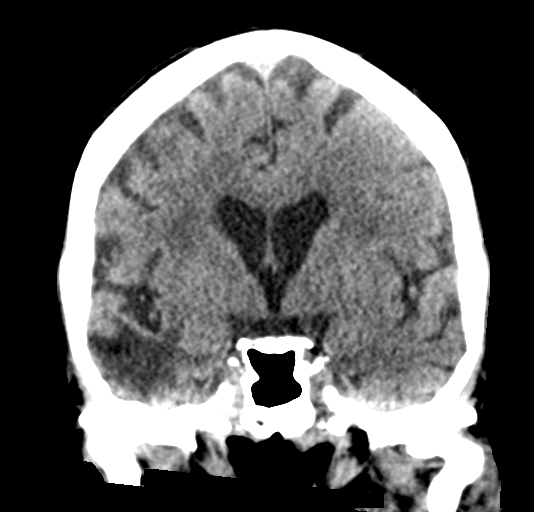
[im 39/71  brain]
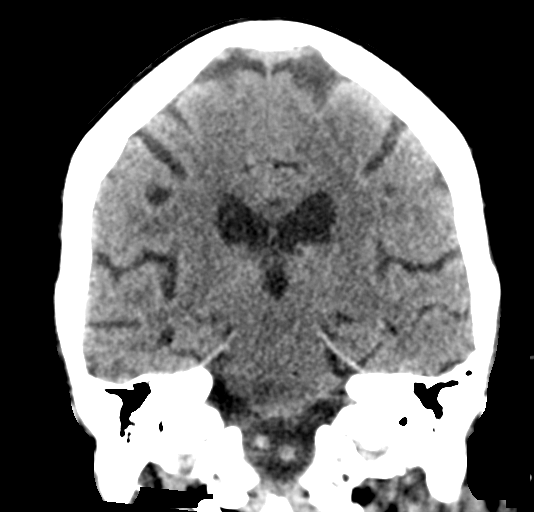

[Series 6: head without sag · sagittal · non-contrast · 0.31mm/px · 3 of 54 slices shown]
[im 18/54  brain]
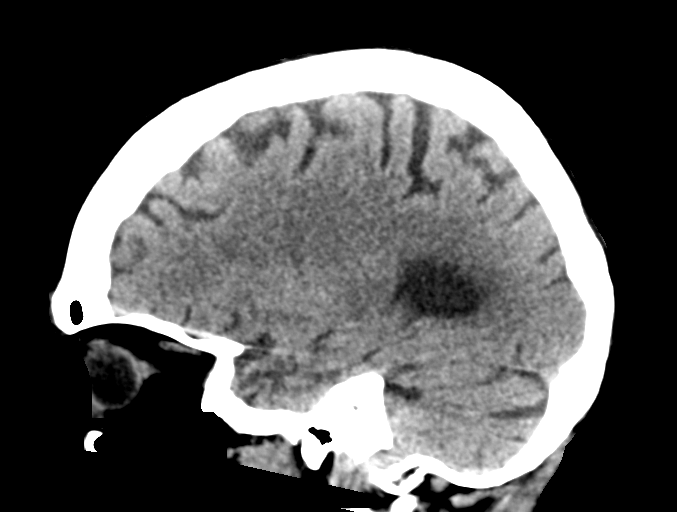
[im 27/54  brain]
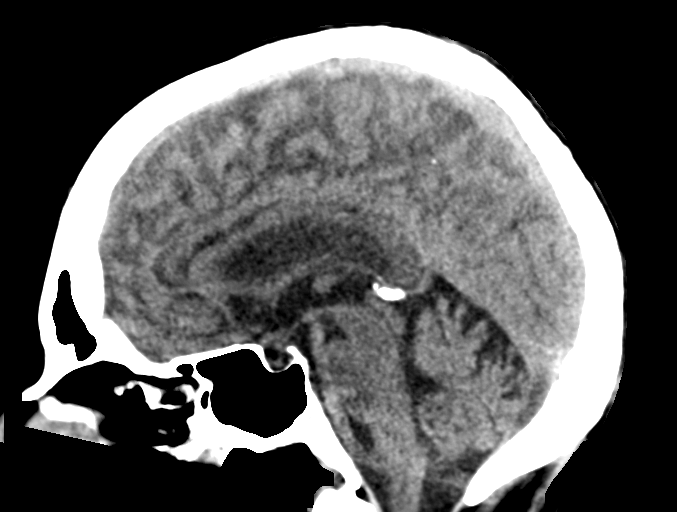
[im 36/54  brain]
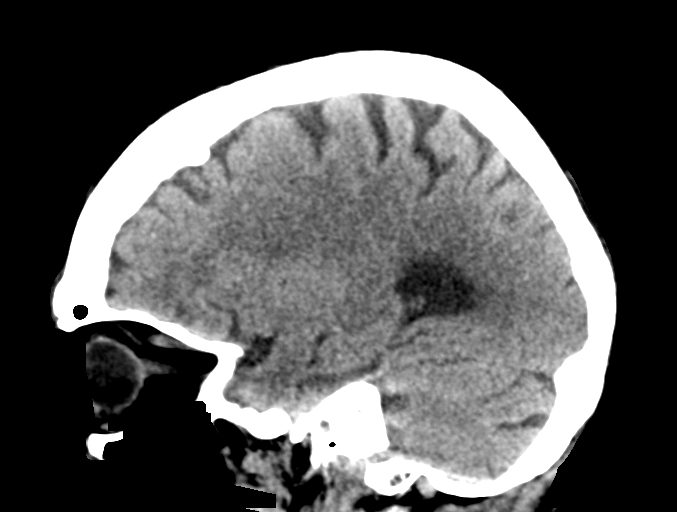

[15 of 47 positions shown; findings below may reference images not displayed]

FINDINGS: Brain: There is mild diffuse atrophy. There is no intracranial mass,
hemorrhage, extra-axial fluid collection, or midline shift. There is
chronic right temporal lobe encephalomalacia, stable. There is a
prior infarct involving the head of the caudate nucleus on the
right. There is patchy small vessel disease in the centra semiovale
bilaterally, stable. No acute infarct demonstrable on this study.

Vascular: No hyperdense vessel. There is calcification in each
carotid siphon and distal vertebral artery.

Skull: The bony calvarium appears intact. Small bony enostosis
arising from the left frontal bone. There is evidence of old trauma
with remodeling involving the left mandibular condyle with
osteoarthritic change in this area.

Sinuses/Orbits: There is mucosal thickening in several ethmoid air
cells. Other visualized paranasal sinuses are clear. Orbits appear
symmetric bilaterally except for evidence of cataract removal on the
left.

Other: There is chronic opacification of several inferior mastoid
air cells. Most mastoid air cells bilaterally are clear.
IMPRESSION: 1. Atrophy with periventricular small vessel disease, stable.
Chronic encephalomalacia right temporal lobe, stable. Prior infarct
head of caudate nucleus on the right. No new gray-white compartment
lesion. No acute infarct.

2. No appreciable mass, hemorrhage, or extra-axial fluid collection.

3.  Foci of arterial vascular calcification evident.

4. Areas of paranasal sinus disease. Mild opacification of several
inferior mastoid air cells bilaterally, stable.

## 2019-02-19 DIAGNOSIS — R2681 Unsteadiness on feet: Secondary | ICD-10-CM | POA: Diagnosis not present

## 2019-02-19 DIAGNOSIS — R41841 Cognitive communication deficit: Secondary | ICD-10-CM | POA: Diagnosis not present

## 2019-02-19 DIAGNOSIS — I63312 Cerebral infarction due to thrombosis of left middle cerebral artery: Secondary | ICD-10-CM | POA: Diagnosis not present

## 2019-02-19 DIAGNOSIS — R1312 Dysphagia, oropharyngeal phase: Secondary | ICD-10-CM | POA: Diagnosis not present

## 2019-02-19 DIAGNOSIS — R2689 Other abnormalities of gait and mobility: Secondary | ICD-10-CM | POA: Diagnosis not present

## 2019-02-19 DIAGNOSIS — M6281 Muscle weakness (generalized): Secondary | ICD-10-CM | POA: Diagnosis not present

## 2019-02-20 DIAGNOSIS — I63312 Cerebral infarction due to thrombosis of left middle cerebral artery: Secondary | ICD-10-CM | POA: Diagnosis not present

## 2019-02-20 DIAGNOSIS — R41841 Cognitive communication deficit: Secondary | ICD-10-CM | POA: Diagnosis not present

## 2019-02-20 DIAGNOSIS — R1312 Dysphagia, oropharyngeal phase: Secondary | ICD-10-CM | POA: Diagnosis not present

## 2019-02-20 DIAGNOSIS — R2681 Unsteadiness on feet: Secondary | ICD-10-CM | POA: Diagnosis not present

## 2019-02-20 DIAGNOSIS — M6281 Muscle weakness (generalized): Secondary | ICD-10-CM | POA: Diagnosis not present

## 2019-02-20 DIAGNOSIS — R2689 Other abnormalities of gait and mobility: Secondary | ICD-10-CM | POA: Diagnosis not present

## 2019-02-21 ENCOUNTER — Telehealth: Payer: Self-pay | Admitting: Internal Medicine

## 2019-02-21 DIAGNOSIS — R1312 Dysphagia, oropharyngeal phase: Secondary | ICD-10-CM | POA: Diagnosis not present

## 2019-02-21 DIAGNOSIS — R2689 Other abnormalities of gait and mobility: Secondary | ICD-10-CM | POA: Diagnosis not present

## 2019-02-21 DIAGNOSIS — G4701 Insomnia due to medical condition: Secondary | ICD-10-CM | POA: Diagnosis not present

## 2019-02-21 DIAGNOSIS — I63312 Cerebral infarction due to thrombosis of left middle cerebral artery: Secondary | ICD-10-CM | POA: Diagnosis not present

## 2019-02-21 DIAGNOSIS — M6281 Muscle weakness (generalized): Secondary | ICD-10-CM | POA: Diagnosis not present

## 2019-02-21 DIAGNOSIS — R41841 Cognitive communication deficit: Secondary | ICD-10-CM | POA: Diagnosis not present

## 2019-02-21 DIAGNOSIS — F331 Major depressive disorder, recurrent, moderate: Secondary | ICD-10-CM | POA: Diagnosis not present

## 2019-02-21 DIAGNOSIS — F419 Anxiety disorder, unspecified: Secondary | ICD-10-CM | POA: Diagnosis not present

## 2019-02-21 DIAGNOSIS — R2681 Unsteadiness on feet: Secondary | ICD-10-CM | POA: Diagnosis not present

## 2019-02-22 ENCOUNTER — Telehealth: Payer: Self-pay | Admitting: Internal Medicine

## 2019-02-22 DIAGNOSIS — M6281 Muscle weakness (generalized): Secondary | ICD-10-CM | POA: Diagnosis not present

## 2019-02-22 DIAGNOSIS — R2681 Unsteadiness on feet: Secondary | ICD-10-CM | POA: Diagnosis not present

## 2019-02-22 DIAGNOSIS — R41841 Cognitive communication deficit: Secondary | ICD-10-CM | POA: Diagnosis not present

## 2019-02-22 DIAGNOSIS — R1312 Dysphagia, oropharyngeal phase: Secondary | ICD-10-CM | POA: Diagnosis not present

## 2019-02-22 DIAGNOSIS — R2689 Other abnormalities of gait and mobility: Secondary | ICD-10-CM | POA: Diagnosis not present

## 2019-02-22 DIAGNOSIS — I63312 Cerebral infarction due to thrombosis of left middle cerebral artery: Secondary | ICD-10-CM | POA: Diagnosis not present

## 2019-02-22 NOTE — Telephone Encounter (Signed)
Virtual Visit Pre-Appointment Phone Call  "(Name), I am calling you today to discuss your upcoming appointment. We are currently trying to limit exposure to the virus that causes COVID-19 by seeing patients at home rather than in the office."  1. "What is the BEST phone number to call the day of the visit?" - include this in appointment notes  2. Do you have or have access to (through a family member/friend) a smartphone with video capability that we can use for your visit?" a. If yes - list this number in appt notes as cell (if different from BEST phone #) and list the appointment type as a VIDEO visit in appointment notes b. If no - list the appointment type as a PHONE visit in appointment notes  3. Confirm consent - "In the setting of the current Covid19 crisis, you are scheduled for a (phone or video) visit with your provider on (date) at (time).  Just as we do with many in-office visits, in order for you to participate in this visit, we must obtain consent.  If you'd like, I can send this to your mychart (if signed up) or email for you to review.  Otherwise, I can obtain your verbal consent now.  All virtual visits are billed to your insurance company just like a normal visit would be.  By agreeing to a virtual visit, we'd like you to understand that the technology does not allow for your provider to perform an examination, and thus may limit your provider's ability to fully assess your condition. If your provider identifies any concerns that need to be evaluated in person, we will make arrangements to do so.  Finally, though the technology is pretty good, we cannot assure that it will always work on either your or our end, and in the setting of a video visit, we may have to convert it to a phone-only visit.  In either situation, we cannot ensure that we have a secure connection.  Are you willing to proceed?" YES  4. Advise patient to be prepared - "Two hours prior to your appointment, go  ahead and check your blood pressure, pulse, oxygen saturation, and your weight (if you have the equipment to check those) and write them all down. When your visit starts, your provider will ask you for this information. If you have an Apple Watch or Kardia device, please plan to have heart rate information ready on the day of your appointment. Please have a pen and paper handy nearby the day of the visit as well."  5. Give patient instructions for MyChart download to smartphone OR [email or web address] as below if video visit (depending on what platform provider is using)  6. Inform patient they will receive a phone call 15 minutes prior to their appointment time (may be from unknown caller ID) so they should be prepared to answer    TELEPHONE CALL NOTE  Kayla Kent has been deemed a candidate for a follow-up tele-health visit to limit community exposure during the Covid-19 pandemic. I spoke with the patient via phone to ensure availability of phone/video source, confirm preferred email & phone number, and discuss instructions and expectations.  I reminded Kayla Kent to be prepared with any vital sign and/or heart rhythm information that could potentially be obtained via home monitoring, at the time of her visit. I reminded Kayla Kent to expect a phone call prior to her visit.  Kayla Kent 02/22/2019 10:15 AM   INSTRUCTIONS FOR DOWNLOADING  THE MYCHART APP TO SMARTPHONE  - The patient must first make sure to have activated MyChart and know their login information - If Apple, go to [employer] and type in MyChart in the search bar and download the app. If Android, ask patient to go to [employer] and type in [place] in the search bar and download the app. The app is free but as with any other app downloads, their phone may require them to verify saved payment information or Apple/Android password.  - The patient will need to then log into the app with their MyChart username and  password, and select [facility] as their healthcare provider to link the account. When it is time for your visit, go to the MyChart app, find appointments, and click Begin Video Visit. Be sure to Select Allow for your device to access the Microphone and Camera for your visit. You will then be connected, and your provider will be with you shortly.  **If they have any issues connecting, or need assistance please contact MyChart service desk [phone])**  **If using a computer, in order to ensure the best quality for their visit they will need to use either of the following Internet Browsers: [employer], or Google Chrome**  IF USING DOXIMITY or DOXY.ME - The patient will receive a link just prior to their visit by text.     FULL LENGTH CONSENT FOR TELE-HEALTH VISIT   I hereby voluntarily request, consent and authorize [facility] and its employed or contracted physicians, physician assistants, nurse practitioners or other licensed health care professionals (the Practitioner), to provide me with telemedicine health care services (the Services") as deemed necessary by the treating Practitioner. I acknowledge and consent to receive the Services by the Practitioner via telemedicine. I understand that the telemedicine visit will involve communicating with the Practitioner through live audiovisual communication technology and the disclosure of certain medical information by electronic transmission. I acknowledge that I have been given the opportunity to request an in-person assessment or other available alternative prior to the telemedicine visit and am voluntarily participating in the telemedicine visit.  I understand that I have the right to withhold or withdraw my consent to the use of telemedicine in the course of my care at any time, without affecting my right to future care or treatment, and that the Practitioner or I may terminate the telemedicine visit at any time. I  understand that I have the right to inspect all information obtained and/or recorded in the course of the telemedicine visit and may receive copies of available information for a reasonable fee.  I understand that some of the potential risks of receiving the Services via telemedicine include:   Delay or interruption in medical evaluation due to technological equipment failure or disruption;  Information transmitted may not be sufficient (e.g. poor resolution of images) to allow for appropriate medical decision making by the Practitioner; and/or   In rare instances, security protocols could fail, causing a breach of personal health information.  Furthermore, I acknowledge that it is my responsibility to provide information about my medical history, conditions and care that is complete and accurate to the best of my ability. I acknowledge that Practitioner's advice, recommendations, and/or decision may be based on factors not within their control, such as incomplete or inaccurate data provided by me or distortions of diagnostic images or specimens that may result from electronic transmissions. I understand that the practice of medicine is not an exact science and that Practitioner  makes no warranties or guarantees regarding treatment outcomes. I acknowledge that I will receive a copy of this consent concurrently upon execution via email to the email address I last provided but may also request a printed copy by calling the office of [facility].    I understand that my insurance will be billed for this visit.   I have read or had this consent read to me.  I understand the contents of this consent, which adequately explains the benefits and risks of the Services being provided via telemedicine.   I have been provided ample opportunity to ask questions regarding this consent and the Services and have had my questions answered to my satisfaction.  I give my informed consent for the services to be  provided through the use of telemedicine in my medical care  By participating in this telemedicine visit I agree to the above.

## 2019-02-22 NOTE — Telephone Encounter (Signed)
Spoke with daughter. She says Kayla Kent is in [place] facility. She says that due to the Covid she can not be there with her mom but she is going to call over to the facility and let them know about her moms appt. And call me back with a phone number of the person who will be assisting her with her visit.

## 2019-02-23 ENCOUNTER — Telehealth: Payer: Self-pay | Admitting: Internal Medicine

## 2019-02-23 DIAGNOSIS — R41841 Cognitive communication deficit: Secondary | ICD-10-CM | POA: Diagnosis not present

## 2019-02-23 DIAGNOSIS — R2689 Other abnormalities of gait and mobility: Secondary | ICD-10-CM | POA: Diagnosis not present

## 2019-02-23 DIAGNOSIS — M6281 Muscle weakness (generalized): Secondary | ICD-10-CM | POA: Diagnosis not present

## 2019-02-23 DIAGNOSIS — I63312 Cerebral infarction due to thrombosis of left middle cerebral artery: Secondary | ICD-10-CM | POA: Diagnosis not present

## 2019-02-23 DIAGNOSIS — R2681 Unsteadiness on feet: Secondary | ICD-10-CM | POA: Diagnosis not present

## 2019-02-23 DIAGNOSIS — R1312 Dysphagia, oropharyngeal phase: Secondary | ICD-10-CM | POA: Diagnosis not present

## 2019-02-23 NOTE — Telephone Encounter (Signed)
Virtual Visit Pre-Appointment Phone Call  "(Name), I am calling you today to discuss your upcoming appointment. We are currently trying to limit exposure to the virus that causes COVID-19 by seeing patients at home rather than in the office."  1. "What is the BEST phone number to call the day of the visit?" - include this in appointment notes  2. Do you have or have access to (through a family member/friend) a smartphone with video capability that we can use for your visit?" a. If yes - list this number in appt notes as cell (if different from BEST phone #) and list the appointment type as a VIDEO visit in appointment notes b. If no - list the appointment type as a PHONE visit in appointment notes  3. Confirm consent - "In the setting of the current Covid19 crisis, you are scheduled for a (phone or video) visit with your provider on (date) at (time).  Just as we do with many in-office visits, in order for you to participate in this visit, we must obtain consent.  If you'd like, I can send this to your mychart (if signed up) or email for you to review.  Otherwise, I can obtain your verbal consent now.  All virtual visits are billed to your insurance company just like a normal visit would be.  By agreeing to a virtual visit, we'd like you to understand that the technology does not allow for your provider to perform an examination, and thus may limit your provider's ability to fully assess your condition. If your provider identifies any concerns that need to be evaluated in person, we will make arrangements to do so.  Finally, though the technology is pretty good, we cannot assure that it will always work on either your or our end, and in the setting of a video visit, we may have to convert it to a phone-only visit.  In either situation, we cannot ensure that we have a secure connection.  Are you willing to proceed?"  YES  4. Advise patient to be prepared - "Two hours prior to your appointment, go  ahead and check your blood pressure, pulse, oxygen saturation, and your weight (if you have the equipment to check those) and write them all down. When your visit starts, your provider will ask you for this information. If you have an Apple Watch or Kardia device, please plan to have heart rate information ready on the day of your appointment. Please have a pen and paper handy nearby the day of the visit as well."  5. Give patient instructions for MyChart download to smartphone OR Doximity/Doxy.me as below if video visit (depending on what platform provider is using)  6. Inform patient they will receive a phone call 15 minutes prior to their appointment time (may be from unknown caller ID) so they should be prepared to answer    TELEPHONE CALL NOTE  Kayla Kent has been deemed a candidate for a follow-up tele-health visit to limit community exposure during the Covid-19 pandemic. I spoke with the patient via phone to ensure availability of phone/video source, confirm preferred email & phone number, and discuss instructions and expectations.  I reminded Kayla Kent to be prepared with any vital sign and/or heart rhythm information that could potentially be obtained via home monitoring, at the time of her visit. I reminded Kayla Kent to expect a phone call prior to her visit.  Minus Liberty 02/23/2019 3:27 PM   INSTRUCTIONS FOR  DOWNLOADING THE MYCHART APP TO SMARTPHONE  - The patient must first make sure to have activated MyChart and know their login information - If Apple, go to [employer] and type in MyChart in the search bar and download the app. If Android, ask patient to go to [employer] and type in [place] in the search bar and download the app. The app is free but as with any other app downloads, their phone may require them to verify saved payment information or Apple/Android password.  - The patient will need to then log into the app with their MyChart username and  password, and select [facility] as their healthcare provider to link the account. When it is time for your visit, go to the MyChart app, find appointments, and click Begin Video Visit. Be sure to Select Allow for your device to access the Microphone and Camera for your visit. You will then be connected, and your provider will be with you shortly.  **If they have any issues connecting, or need assistance please contact MyChart service desk [phone])**  **If using a computer, in order to ensure the best quality for their visit they will need to use either of the following Internet Browsers: [employer], or Google Chrome**  IF USING DOXIMITY or DOXY.ME - The patient will receive a link just prior to their visit by text.     FULL LENGTH CONSENT FOR TELE-HEALTH VISIT   I hereby voluntarily request, consent and authorize [facility] and its employed or contracted physicians, physician assistants, nurse practitioners or other licensed health care professionals (the Practitioner), to provide me with telemedicine health care services (the Services") as deemed necessary by the treating Practitioner. I acknowledge and consent to receive the Services by the Practitioner via telemedicine. I understand that the telemedicine visit will involve communicating with the Practitioner through live audiovisual communication technology and the disclosure of certain medical information by electronic transmission. I acknowledge that I have been given the opportunity to request an in-person assessment or other available alternative prior to the telemedicine visit and am voluntarily participating in the telemedicine visit.  I understand that I have the right to withhold or withdraw my consent to the use of telemedicine in the course of my care at any time, without affecting my right to future care or treatment, and that the Practitioner or I may terminate the telemedicine visit at any time. I  understand that I have the right to inspect all information obtained and/or recorded in the course of the telemedicine visit and may receive copies of available information for a reasonable fee.  I understand that some of the potential risks of receiving the Services via telemedicine include:   Delay or interruption in medical evaluation due to technological equipment failure or disruption;  Information transmitted may not be sufficient (e.g. poor resolution of images) to allow for appropriate medical decision making by the Practitioner; and/or   In rare instances, security protocols could fail, causing a breach of personal health information.  Furthermore, I acknowledge that it is my responsibility to provide information about my medical history, conditions and care that is complete and accurate to the best of my ability. I acknowledge that Practitioner's advice, recommendations, and/or decision may be based on factors not within their control, such as incomplete or inaccurate data provided by me or distortions of diagnostic images or specimens that may result from electronic transmissions. I understand that the practice of medicine is not an exact science and that  Practitioner makes no warranties or guarantees regarding treatment outcomes. I acknowledge that I will receive a copy of this consent concurrently upon execution via email to the email address I last provided but may also request a printed copy by calling the office of [facility].    I understand that my insurance will be billed for this visit.   I have read or had this consent read to me.  I understand the contents of this consent, which adequately explains the benefits and risks of the Services being provided via telemedicine.   I have been provided ample opportunity to ask questions regarding this consent and the Services and have had my questions answered to my satisfaction.  I give my informed consent for the services to be  provided through the use of telemedicine in my medical care  By participating in this telemedicine visit I agree to the above.

## 2019-02-23 NOTE — Telephone Encounter (Signed)
Pt. Nurse Lelon Frohlich will be assisting with her visit. Lelon Frohlich has a Stage manager.

## 2019-02-23 NOTE — Telephone Encounter (Signed)
Spoke with daughter this morning. She says that her mothers nursing home has not called her back yet to give her a phone number that we can call for his moms visit. She says that she will call with the number as soon as they call her.

## 2019-02-26 DIAGNOSIS — M6281 Muscle weakness (generalized): Secondary | ICD-10-CM | POA: Diagnosis not present

## 2019-02-26 DIAGNOSIS — R1312 Dysphagia, oropharyngeal phase: Secondary | ICD-10-CM | POA: Diagnosis not present

## 2019-02-26 DIAGNOSIS — R2689 Other abnormalities of gait and mobility: Secondary | ICD-10-CM | POA: Diagnosis not present

## 2019-02-26 DIAGNOSIS — R2681 Unsteadiness on feet: Secondary | ICD-10-CM | POA: Diagnosis not present

## 2019-02-26 DIAGNOSIS — R41841 Cognitive communication deficit: Secondary | ICD-10-CM | POA: Diagnosis not present

## 2019-02-26 DIAGNOSIS — I63312 Cerebral infarction due to thrombosis of left middle cerebral artery: Secondary | ICD-10-CM | POA: Diagnosis not present

## 2019-02-27 DIAGNOSIS — R2681 Unsteadiness on feet: Secondary | ICD-10-CM | POA: Diagnosis not present

## 2019-02-27 DIAGNOSIS — R1312 Dysphagia, oropharyngeal phase: Secondary | ICD-10-CM | POA: Diagnosis not present

## 2019-02-27 DIAGNOSIS — M6281 Muscle weakness (generalized): Secondary | ICD-10-CM | POA: Diagnosis not present

## 2019-02-27 DIAGNOSIS — R2689 Other abnormalities of gait and mobility: Secondary | ICD-10-CM | POA: Diagnosis not present

## 2019-02-27 DIAGNOSIS — R41841 Cognitive communication deficit: Secondary | ICD-10-CM | POA: Diagnosis not present

## 2019-02-27 DIAGNOSIS — I63312 Cerebral infarction due to thrombosis of left middle cerebral artery: Secondary | ICD-10-CM | POA: Diagnosis not present

## 2019-02-28 DIAGNOSIS — R2681 Unsteadiness on feet: Secondary | ICD-10-CM | POA: Diagnosis not present

## 2019-02-28 DIAGNOSIS — I63312 Cerebral infarction due to thrombosis of left middle cerebral artery: Secondary | ICD-10-CM | POA: Diagnosis not present

## 2019-02-28 DIAGNOSIS — M6281 Muscle weakness (generalized): Secondary | ICD-10-CM | POA: Diagnosis not present

## 2019-02-28 DIAGNOSIS — R2689 Other abnormalities of gait and mobility: Secondary | ICD-10-CM | POA: Diagnosis not present

## 2019-02-28 DIAGNOSIS — R41841 Cognitive communication deficit: Secondary | ICD-10-CM | POA: Diagnosis not present

## 2019-02-28 DIAGNOSIS — R1312 Dysphagia, oropharyngeal phase: Secondary | ICD-10-CM | POA: Diagnosis not present

## 2019-03-01 DIAGNOSIS — R1312 Dysphagia, oropharyngeal phase: Secondary | ICD-10-CM | POA: Diagnosis not present

## 2019-03-01 DIAGNOSIS — R2689 Other abnormalities of gait and mobility: Secondary | ICD-10-CM | POA: Diagnosis not present

## 2019-03-01 DIAGNOSIS — R41841 Cognitive communication deficit: Secondary | ICD-10-CM | POA: Diagnosis not present

## 2019-03-01 DIAGNOSIS — I63312 Cerebral infarction due to thrombosis of left middle cerebral artery: Secondary | ICD-10-CM | POA: Diagnosis not present

## 2019-03-01 DIAGNOSIS — M6281 Muscle weakness (generalized): Secondary | ICD-10-CM | POA: Diagnosis not present

## 2019-03-01 DIAGNOSIS — R2681 Unsteadiness on feet: Secondary | ICD-10-CM | POA: Diagnosis not present

## 2019-03-02 DIAGNOSIS — R1312 Dysphagia, oropharyngeal phase: Secondary | ICD-10-CM | POA: Diagnosis not present

## 2019-03-02 DIAGNOSIS — M6281 Muscle weakness (generalized): Secondary | ICD-10-CM | POA: Diagnosis not present

## 2019-03-02 DIAGNOSIS — R2681 Unsteadiness on feet: Secondary | ICD-10-CM | POA: Diagnosis not present

## 2019-03-02 DIAGNOSIS — R2689 Other abnormalities of gait and mobility: Secondary | ICD-10-CM | POA: Diagnosis not present

## 2019-03-02 DIAGNOSIS — I63312 Cerebral infarction due to thrombosis of left middle cerebral artery: Secondary | ICD-10-CM | POA: Diagnosis not present

## 2019-03-02 DIAGNOSIS — R41841 Cognitive communication deficit: Secondary | ICD-10-CM | POA: Diagnosis not present

## 2019-03-05 DIAGNOSIS — R41841 Cognitive communication deficit: Secondary | ICD-10-CM | POA: Diagnosis not present

## 2019-03-05 DIAGNOSIS — R2689 Other abnormalities of gait and mobility: Secondary | ICD-10-CM | POA: Diagnosis not present

## 2019-03-05 DIAGNOSIS — M6281 Muscle weakness (generalized): Secondary | ICD-10-CM | POA: Diagnosis not present

## 2019-03-05 DIAGNOSIS — R1312 Dysphagia, oropharyngeal phase: Secondary | ICD-10-CM | POA: Diagnosis not present

## 2019-03-05 DIAGNOSIS — I63312 Cerebral infarction due to thrombosis of left middle cerebral artery: Secondary | ICD-10-CM | POA: Diagnosis not present

## 2019-03-05 DIAGNOSIS — R2681 Unsteadiness on feet: Secondary | ICD-10-CM | POA: Diagnosis not present

## 2019-03-06 DIAGNOSIS — I4891 Unspecified atrial fibrillation: Secondary | ICD-10-CM | POA: Diagnosis not present

## 2019-03-06 DIAGNOSIS — J449 Chronic obstructive pulmonary disease, unspecified: Secondary | ICD-10-CM | POA: Diagnosis not present

## 2019-03-06 DIAGNOSIS — J309 Allergic rhinitis, unspecified: Secondary | ICD-10-CM | POA: Diagnosis not present

## 2019-03-06 DIAGNOSIS — R2681 Unsteadiness on feet: Secondary | ICD-10-CM | POA: Diagnosis not present

## 2019-03-06 DIAGNOSIS — I69322 Dysarthria following cerebral infarction: Secondary | ICD-10-CM | POA: Diagnosis not present

## 2019-03-06 DIAGNOSIS — I69391 Dysphagia following cerebral infarction: Secondary | ICD-10-CM | POA: Diagnosis not present

## 2019-03-06 DIAGNOSIS — R1312 Dysphagia, oropharyngeal phase: Secondary | ICD-10-CM | POA: Diagnosis not present

## 2019-03-06 DIAGNOSIS — R2689 Other abnormalities of gait and mobility: Secondary | ICD-10-CM | POA: Diagnosis not present

## 2019-03-06 DIAGNOSIS — E440 Moderate protein-calorie malnutrition: Secondary | ICD-10-CM | POA: Diagnosis not present

## 2019-03-06 DIAGNOSIS — E785 Hyperlipidemia, unspecified: Secondary | ICD-10-CM | POA: Diagnosis not present

## 2019-03-06 DIAGNOSIS — R338 Other retention of urine: Secondary | ICD-10-CM | POA: Diagnosis not present

## 2019-03-06 DIAGNOSIS — I69351 Hemiplegia and hemiparesis following cerebral infarction affecting right dominant side: Secondary | ICD-10-CM | POA: Diagnosis not present

## 2019-03-06 DIAGNOSIS — R54 Age-related physical debility: Secondary | ICD-10-CM | POA: Diagnosis not present

## 2019-03-06 DIAGNOSIS — M6281 Muscle weakness (generalized): Secondary | ICD-10-CM | POA: Diagnosis not present

## 2019-03-06 DIAGNOSIS — R41841 Cognitive communication deficit: Secondary | ICD-10-CM | POA: Diagnosis not present

## 2019-03-06 DIAGNOSIS — I63312 Cerebral infarction due to thrombosis of left middle cerebral artery: Secondary | ICD-10-CM | POA: Diagnosis not present

## 2019-03-06 DIAGNOSIS — Z85118 Personal history of other malignant neoplasm of bronchus and lung: Secondary | ICD-10-CM | POA: Diagnosis not present

## 2019-03-06 DIAGNOSIS — R32 Unspecified urinary incontinence: Secondary | ICD-10-CM | POA: Diagnosis not present

## 2019-03-07 ENCOUNTER — Other Ambulatory Visit: Payer: Self-pay

## 2019-03-07 ENCOUNTER — Ambulatory Visit
Admission: RE | Admit: 2019-03-07 | Discharge: 2019-03-07 | Disposition: A | Discharge status: Home / Self Care | Payer: Medicare Other | Source: Ambulatory Visit | Attending: Nurse Practitioner | Admitting: Nurse Practitioner

## 2019-03-07 DIAGNOSIS — R2689 Other abnormalities of gait and mobility: Secondary | ICD-10-CM | POA: Diagnosis not present

## 2019-03-07 DIAGNOSIS — R1312 Dysphagia, oropharyngeal phase: Secondary | ICD-10-CM | POA: Diagnosis not present

## 2019-03-07 DIAGNOSIS — R2681 Unsteadiness on feet: Secondary | ICD-10-CM | POA: Diagnosis not present

## 2019-03-07 DIAGNOSIS — R41841 Cognitive communication deficit: Secondary | ICD-10-CM | POA: Diagnosis not present

## 2019-03-07 DIAGNOSIS — M6281 Muscle weakness (generalized): Secondary | ICD-10-CM | POA: Diagnosis not present

## 2019-03-07 DIAGNOSIS — I63312 Cerebral infarction due to thrombosis of left middle cerebral artery: Secondary | ICD-10-CM | POA: Diagnosis not present

## 2019-03-07 DIAGNOSIS — M542 Cervicalgia: Secondary | ICD-10-CM | POA: Diagnosis not present

## 2019-03-08 DIAGNOSIS — R1312 Dysphagia, oropharyngeal phase: Secondary | ICD-10-CM | POA: Diagnosis not present

## 2019-03-08 DIAGNOSIS — R41841 Cognitive communication deficit: Secondary | ICD-10-CM | POA: Diagnosis not present

## 2019-03-08 DIAGNOSIS — R2681 Unsteadiness on feet: Secondary | ICD-10-CM | POA: Diagnosis not present

## 2019-03-08 DIAGNOSIS — I63312 Cerebral infarction due to thrombosis of left middle cerebral artery: Secondary | ICD-10-CM | POA: Diagnosis not present

## 2019-03-08 DIAGNOSIS — R2689 Other abnormalities of gait and mobility: Secondary | ICD-10-CM | POA: Diagnosis not present

## 2019-03-08 DIAGNOSIS — M6281 Muscle weakness (generalized): Secondary | ICD-10-CM | POA: Diagnosis not present

## 2019-03-09 ENCOUNTER — Telehealth (INDEPENDENT_AMBULATORY_CARE_PROVIDER_SITE_OTHER): Payer: Medicare Other | Admitting: Internal Medicine

## 2019-03-09 ENCOUNTER — Encounter: Payer: Self-pay | Admitting: Internal Medicine

## 2019-03-09 ENCOUNTER — Other Ambulatory Visit: Payer: Self-pay

## 2019-03-09 VITALS — BP 124/63 | HR 78 | Ht 67.0 in | Wt 110.4 lb

## 2019-03-09 DIAGNOSIS — I4891 Unspecified atrial fibrillation: Secondary | ICD-10-CM | POA: Diagnosis not present

## 2019-03-09 DIAGNOSIS — I10 Essential (primary) hypertension: Secondary | ICD-10-CM

## 2019-03-09 DIAGNOSIS — M6281 Muscle weakness (generalized): Secondary | ICD-10-CM | POA: Diagnosis not present

## 2019-03-09 DIAGNOSIS — R21 Rash and other nonspecific skin eruption: Secondary | ICD-10-CM | POA: Diagnosis not present

## 2019-03-09 DIAGNOSIS — I480 Paroxysmal atrial fibrillation: Secondary | ICD-10-CM | POA: Diagnosis not present

## 2019-03-09 DIAGNOSIS — R54 Age-related physical debility: Secondary | ICD-10-CM | POA: Diagnosis not present

## 2019-03-09 DIAGNOSIS — R2681 Unsteadiness on feet: Secondary | ICD-10-CM | POA: Diagnosis not present

## 2019-03-09 DIAGNOSIS — R2689 Other abnormalities of gait and mobility: Secondary | ICD-10-CM | POA: Diagnosis not present

## 2019-03-09 DIAGNOSIS — M62838 Other muscle spasm: Secondary | ICD-10-CM | POA: Diagnosis not present

## 2019-03-09 DIAGNOSIS — I639 Cerebral infarction, unspecified: Secondary | ICD-10-CM | POA: Diagnosis not present

## 2019-03-09 DIAGNOSIS — R1312 Dysphagia, oropharyngeal phase: Secondary | ICD-10-CM | POA: Diagnosis not present

## 2019-03-09 DIAGNOSIS — R41841 Cognitive communication deficit: Secondary | ICD-10-CM | POA: Diagnosis not present

## 2019-03-09 DIAGNOSIS — I63312 Cerebral infarction due to thrombosis of left middle cerebral artery: Secondary | ICD-10-CM | POA: Diagnosis not present

## 2019-03-09 DIAGNOSIS — I69351 Hemiplegia and hemiparesis following cerebral infarction affecting right dominant side: Secondary | ICD-10-CM | POA: Diagnosis not present

## 2019-03-09 DIAGNOSIS — Z85118 Personal history of other malignant neoplasm of bronchus and lung: Secondary | ICD-10-CM | POA: Diagnosis not present

## 2019-03-09 DIAGNOSIS — E440 Moderate protein-calorie malnutrition: Secondary | ICD-10-CM | POA: Diagnosis not present

## 2019-03-09 DIAGNOSIS — E785 Hyperlipidemia, unspecified: Secondary | ICD-10-CM | POA: Diagnosis not present

## 2019-03-09 DIAGNOSIS — M542 Cervicalgia: Secondary | ICD-10-CM | POA: Diagnosis not present

## 2019-03-09 DIAGNOSIS — R32 Unspecified urinary incontinence: Secondary | ICD-10-CM | POA: Diagnosis not present

## 2019-03-09 DIAGNOSIS — J309 Allergic rhinitis, unspecified: Secondary | ICD-10-CM | POA: Diagnosis not present

## 2019-03-09 DIAGNOSIS — E559 Vitamin D deficiency, unspecified: Secondary | ICD-10-CM | POA: Diagnosis not present

## 2019-03-09 NOTE — Patient Instructions (Addendum)
Medication Instructions:  No changes If you need a refill on your cardiac medications before your next appointment, please call your pharmacy.   Lab work: None today If you have labs (blood work) drawn today and your tests are completely normal, you will receive your results only by: Marland Kitchen MyChart Message (if you have MyChart) OR . A paper copy in the mail If you have any lab test that is abnormal or we need to change your treatment, we will call you to review the results.  Testing/Procedures: none  Follow-Up: At [facility], you and your health needs are our priority.  As part of our continuing mission to provide you with exceptional heart care, we have created designated Provider Care Teams.  These Care Teams include your primary Cardiologist (physician) and Advanced Practice Providers (APPs -  Physician Assistants and Nurse Practitioners) who all work together to provide you with the care you need, when you need it. You will need a follow up appointment in:  5 months.  Please call our office 2 months in advance to schedule this appointment.  You may see Dorris Carnes, MD or one of the following Advanced Practice Providers on your designated Care Team: Richardson Dopp, PA-C [place], [place] . Daune Perch, NP  Any Other Special Instructions Will Be Listed Below (If Applicable). Copy of visit note has been faxed to [facility] and [facility] [phone].

## 2019-03-09 NOTE — Progress Notes (Signed)
Virtual Visit via Video Note   This visit type was conducted due to national recommendations for restrictions regarding the COVID-19 Pandemic (e.g. social distancing) in an effort to limit this patient's exposure and mitigate transmission in our community.  Due to her co-morbid illnesses, this patient is at least at moderate risk for complications without adequate follow up.  This format is felt to be most appropriate for this patient at this time.  All issues noted in this document were discussed and addressed.  A limited physical exam was performed with this format.  Please refer to the patient's chart for her consent to telehealth for [facility].   Date:  03/09/2019   ID:  Kayla Kent, DOB 13-Apr-1932, MRN [id number]  Patient Location: Home Provider Location: Home  PCP:  Biagio Borg, MD  Cardiologist:  Dorris Carnes, MD  Electrophysiologist:  None   Evaluation Performed:  Follow-Up Visit  Chief Complaint:  Pt presents for televist for f/u of HTN and PAF  History of Present Illness:    Kayla Kent is a 85 y.o. female with a hx of atrial fib, HTN, GI bleeding, sepsis and  CVA.(2018)  I saw her in Nov 2019    Stress myovue negative for ischemia in July 2018    Since I saw her   I spoke with daughter    Geanie Cooley concern is neck pain   Patient has not been getting therapy.  Has been in wheel chair for extended periods  Slumping over      Had CT of neck just done      The patien  Does not have symptoms concerning for COVID-19 infection (fever, chills, cough, or new shortness of breath).    Past Medical History:  Diagnosis Date  . Allergic rhinitis   . Anxiety   . CKD (chronic kidney disease) stage 3, GFR 30-59 ml/min (HCC)   . Emphysema of lung ([place])   . HLD (hyperlipidemia)   . HTN (hypertension)   . Intolerance of drug    orthostatic  . Paroxysmal supraventricular tachycardia ([place])   . PVD (peripheral vascular disease) ([place])   . Seizure ([place])   . Syncope and  collapse   . Uterine prolapse without mention of vaginal wall prolapse    Past Surgical History:  Procedure Laterality Date  . CARDIOVERSION N/A 11/05/2014   Procedure: CARDIOVERSION;  Surgeon: Candee Furbish, MD;  Location[facility] ENDOSCOPY;  Service: Cardiovascular;  Laterality: N/A;  . cataract surgery  08/2015  . CHOLECYSTECTOMY    . corrective eye surgery     as a child  . ESOPHAGOGASTRODUODENOSCOPY N/A 09/09/2017   Procedure: ESOPHAGOGASTRODUODENOSCOPY (EGD);  Surgeon: Mauri Pole, MD;  Location: [facility] ENDOSCOPY;  Service: Endoscopy;  Laterality: N/A;  . EYE SURGERY  08/2018  . INTRAMEDULLARY (IM) NAIL INTERTROCHANTERIC Left 11/21/2016   Procedure: INTRAMEDULLARY (IM) NAIL INTERTROCHANTRIC HEMI;  Surgeon: Newt Minion, MD;  Location: [place];  Service: Orthopedics;  Laterality: Left;  . IR GASTROSTOMY TUBE MOD SED  09/26/2017  . IR PERCUTANEOUS ART THROMBECTOMY/INFUSION INTRACRANIAL INC DIAG ANGIO  09/11/2017  . IVD removed    . OPEN REDUCTION INTERNAL FIXATION (ORIF) DISTAL RADIAL FRACTURE Right 08/29/2014   Procedure: OPEN REDUCTION INTERNAL FIXATION (ORIF) DISTAL RADIAL FRACTURE;  Surgeon: Marianna Payment, MD;  Location: [place];  Service: Orthopedics;  Laterality: Right;  . RADIOLOGY WITH ANESTHESIA N/A 09/11/2017   Procedure: RADIOLOGY WITH ANESTHESIA;  Surgeon: Luanne Bras, MD;  Location: [place];  Service:  Radiology;  Laterality: N/A;  . RF ablation PSVT     summer '10  . stress cardiolite  08/05/93  . TONSILLECTOMY    . TOTAL HIP ARTHROPLASTY Right 08/29/2014   Procedure: Right Hip Hemi Arthroplasty;  Surgeon: Marianna Payment, MD;  Location: [place];  Service: Orthopedics;  Laterality: Right;  Hip procedure 1st wants [employer], [employer], [place].      Current Meds  Medication Sig  . acetaminophen (TYLENOL) 325 MG tablet Take 650 mg by mouth every 4 (four) hours as needed.  Marland Kitchen acetaminophen (TYLENOL) 500 MG tablet Take 1,000 mg by mouth every 8 (eight) hours as  needed.  Marland Kitchen amLODipine (NORVASC) 2.5 MG tablet Take 2.5 mg by mouth daily.  Marland Kitchen apixaban (ELIQUIS) 2.5 MG TABS tablet Take 2.5 mg by mouth 2 (two) times daily.  . ARTIFICIAL TEAR OP Apply to eye as directed.  Marland Kitchen atorvastatin (LIPITOR) 10 MG tablet Take 10 mg by mouth daily.   . bethanechol (URECHOLINE) 10 MG tablet Take 1 tablet (10 mg total) by mouth 3 (three) times daily.  . bisacodyl (DULCOLAX) 10 MG suppository Place 1 suppository (10 mg total) rectally daily as needed for moderate constipation.  . chlorhexidine gluconate, MEDLINE KIT, (PERIDEX) 0.12 % solution 15 mLs by Mouth Rinse route 2 (two) times daily.  Marland Kitchen diltiazem (DILACOR XR) 180 MG 24 hr capsule Take 180 mg by mouth daily.  . Ensure (ENSURE) Take 237 mLs by mouth 3 (three) times daily between meals.  . ergocalciferol (VITAMIN D2) 1.25 MG (50000 UT) capsule Take 50,000 Units by mouth once a week.  . escitalopram (LEXAPRO) 5 MG tablet Take 1 tablet (5 mg total) by mouth daily. (Patient taking differently: Take 10 mg by mouth daily. )  . fluticasone (FLONASE) 50 MCG/ACT nasal spray   . GUAIFENESIN PO Take 10 mLs by mouth as needed.  . hydrocortisone cream 1 % Apply 1 application topically as directed.  . levETIRAcetam (KEPPRA) 250 MG tablet Take 250 mg by mouth 2 (two) times daily.  Marland Kitchen LORAZEPAM PO Take 0.375 mg by mouth at bedtime.  . Melatonin 3 MG TABS Take 0.5 tablets (1.5 mg total) by mouth at bedtime. (Patient taking differently: Take 3 mg by mouth at bedtime. )  . METHOCARBAMOL PO Take by mouth as directed.  Marland Kitchen METOPROLOL TARTRATE PO Take 12.5 mg by mouth 2 (two) times daily.   . NYSTATIN powder   . omeprazole (PRILOSEC) 20 MG capsule Take 20 mg by mouth daily.  . ondansetron (ZOFRAN) 4 MG tablet Take 1 tablet (4 mg total) by mouth every 6 (six) hours as needed for nausea or vomiting.  . polyethylene glycol (MIRALAX / GLYCOLAX) 17 g packet Take 17 g by mouth daily.  Orlie Dakin Sodium (SENNA-DOCUSATE SODIUM PO) Take by  mouth as directed.  . Sodium Phosphates (FLEET ENEMA RE) Place rectally as directed.  . traMADol (ULTRAM) 50 MG tablet Take 50 mg by mouth every 6 (six) hours as needed.     Allergies:   Chocolate; Codeine; Diazepam; Fruit & vegetable daily [nutritional supplements]; Iohexol; Latex; Peach [prunus persica]; Peanut-containing drug products; Penicillins; Strawberry extract; Sulfonamide derivatives; Rosuvastatin calcium; Sulfa antibiotics; Sulfamethoxazole; Crestor [rosuvastatin calcium]; Iodine; Wheat; and Wheat bran   Social History   Tobacco Use  . Smoking status: Former Smoker    Packs/day: 0.25    Years: 38.00    Pack years: 9.50    Start date: 02/15/1952    Last attempt to quit: 10/18/1993  Years since quitting: 25.4  . Smokeless tobacco: Never Used  Substance Use Topics  . Alcohol use: No    Alcohol/week: 0.0 standard drinks  . Drug use: No     Family Hx: The patient's family history includes Arthritis in her father; Heart disease in her brother; Hyperlipidemia in her sister; Hypertension in her brother, sister, and another family member; Mental illness in her father. There is no history of Colon cancer, Breast cancer, Diabetes, Stroke, Cancer, or Lung disease.  ROS:   Please see the history of present illness.     All other systems reviewed and are negative.   Prior CV studies:   The following studies were reviewed today:    Labs/Other Tests and Data Reviewed:    EKG:  No ECG reviewed.  Recent Labs: 06/01/2018: BUN 13; Creatinine 0.83   Recent Lipid Panel Lab Results  Component Value Date/Time   CHOL 132 10/30/2015 09:31 AM   TRIG 99 09/14/2017 11:15 PM   TRIG 141 10/25/2006 09:06 AM   HDL 33 (L) 10/30/2015 09:31 AM   CHOLHDL 4.0 10/30/2015 09:31 AM   LDLCALC 85 10/30/2015 09:31 AM   LDLDIRECT 175.3 01/19/2008 12:16 PM    Wt Readings from Last 3 Encounters:  03/09/19 110 lb 6.4 oz (50.1 kg)  12/05/18 117 lb (53.1 kg)  08/28/18 109 lb 9.6 oz (49.7 kg)      Objective:    Vital Signs:  BP 124/63   Pulse 78   Ht _0  (1.702 m)   Wt 110 lb 6.4 oz (50.1 kg)   BMI 17.29 kg/m    VITAL SIGNS:  reviewed  ASSESSMENT & PLAN:    1. HTN  BP overall is good   Continue current meds    2  PAF  Denies palpitations   Keep on current regimen   3  CVA  Keep on anticoag  4  Neck pain   CT of neck shows some spinal stenosis    Symptoms prob exacerbated by position in chair.   If it continues to be a problem consider referral to Gardenia Phlegm DO at [facility]   He would provide an excellent eval and possible pain relief options    5COVID-19 Education: The signs and symptoms of COVID-19 were discussed with the patient and how to seek care for testing (follow up with PCP or arrange E-visit).  The importance of social distancing was discussed today.  Time:   Today, I have spent 20 minutes with the patient with telehealth technology discussing the above problems.     Medication Adjustments/Labs and Tests Ordered: Current medicines are reviewed at length with the patient today.  Concerns regarding medicines are outlined above.   Tests Ordered: No orders of the defined types were placed in this encounter.   Medication Changes: No orders of the defined types were placed in this encounter.   Disposition:  Follow up October    Signed, Paula Ross, MD  03/09/2019 10:11 AM    [place]

## 2019-03-12 DIAGNOSIS — I63312 Cerebral infarction due to thrombosis of left middle cerebral artery: Secondary | ICD-10-CM | POA: Diagnosis not present

## 2019-03-12 DIAGNOSIS — R41841 Cognitive communication deficit: Secondary | ICD-10-CM | POA: Diagnosis not present

## 2019-03-12 DIAGNOSIS — M6281 Muscle weakness (generalized): Secondary | ICD-10-CM | POA: Diagnosis not present

## 2019-03-12 DIAGNOSIS — R2681 Unsteadiness on feet: Secondary | ICD-10-CM | POA: Diagnosis not present

## 2019-03-12 DIAGNOSIS — R1312 Dysphagia, oropharyngeal phase: Secondary | ICD-10-CM | POA: Diagnosis not present

## 2019-03-12 DIAGNOSIS — R2689 Other abnormalities of gait and mobility: Secondary | ICD-10-CM | POA: Diagnosis not present

## 2019-03-13 DIAGNOSIS — R2681 Unsteadiness on feet: Secondary | ICD-10-CM | POA: Diagnosis not present

## 2019-03-13 DIAGNOSIS — R2689 Other abnormalities of gait and mobility: Secondary | ICD-10-CM | POA: Diagnosis not present

## 2019-03-13 DIAGNOSIS — M6281 Muscle weakness (generalized): Secondary | ICD-10-CM | POA: Diagnosis not present

## 2019-03-13 DIAGNOSIS — I63312 Cerebral infarction due to thrombosis of left middle cerebral artery: Secondary | ICD-10-CM | POA: Diagnosis not present

## 2019-03-13 DIAGNOSIS — R41841 Cognitive communication deficit: Secondary | ICD-10-CM | POA: Diagnosis not present

## 2019-03-13 DIAGNOSIS — R1312 Dysphagia, oropharyngeal phase: Secondary | ICD-10-CM | POA: Diagnosis not present

## 2019-03-14 DIAGNOSIS — R1312 Dysphagia, oropharyngeal phase: Secondary | ICD-10-CM | POA: Diagnosis not present

## 2019-03-14 DIAGNOSIS — M6281 Muscle weakness (generalized): Secondary | ICD-10-CM | POA: Diagnosis not present

## 2019-03-14 DIAGNOSIS — G894 Chronic pain syndrome: Secondary | ICD-10-CM | POA: Diagnosis not present

## 2019-03-14 DIAGNOSIS — R2689 Other abnormalities of gait and mobility: Secondary | ICD-10-CM | POA: Diagnosis not present

## 2019-03-14 DIAGNOSIS — R41841 Cognitive communication deficit: Secondary | ICD-10-CM | POA: Diagnosis not present

## 2019-03-14 DIAGNOSIS — I63312 Cerebral infarction due to thrombosis of left middle cerebral artery: Secondary | ICD-10-CM | POA: Diagnosis not present

## 2019-03-14 DIAGNOSIS — R2681 Unsteadiness on feet: Secondary | ICD-10-CM | POA: Diagnosis not present

## 2019-03-15 DIAGNOSIS — R2689 Other abnormalities of gait and mobility: Secondary | ICD-10-CM | POA: Diagnosis not present

## 2019-03-15 DIAGNOSIS — R2681 Unsteadiness on feet: Secondary | ICD-10-CM | POA: Diagnosis not present

## 2019-03-15 DIAGNOSIS — D649 Anemia, unspecified: Secondary | ICD-10-CM | POA: Diagnosis not present

## 2019-03-15 DIAGNOSIS — I63312 Cerebral infarction due to thrombosis of left middle cerebral artery: Secondary | ICD-10-CM | POA: Diagnosis not present

## 2019-03-15 DIAGNOSIS — R1312 Dysphagia, oropharyngeal phase: Secondary | ICD-10-CM | POA: Diagnosis not present

## 2019-03-15 DIAGNOSIS — M6281 Muscle weakness (generalized): Secondary | ICD-10-CM | POA: Diagnosis not present

## 2019-03-15 DIAGNOSIS — Z79899 Other long term (current) drug therapy: Secondary | ICD-10-CM | POA: Diagnosis not present

## 2019-03-15 DIAGNOSIS — R41841 Cognitive communication deficit: Secondary | ICD-10-CM | POA: Diagnosis not present

## 2019-03-16 DIAGNOSIS — R41841 Cognitive communication deficit: Secondary | ICD-10-CM | POA: Diagnosis not present

## 2019-03-16 DIAGNOSIS — R2681 Unsteadiness on feet: Secondary | ICD-10-CM | POA: Diagnosis not present

## 2019-03-16 DIAGNOSIS — I63312 Cerebral infarction due to thrombosis of left middle cerebral artery: Secondary | ICD-10-CM | POA: Diagnosis not present

## 2019-03-16 DIAGNOSIS — R2689 Other abnormalities of gait and mobility: Secondary | ICD-10-CM | POA: Diagnosis not present

## 2019-03-16 DIAGNOSIS — M6281 Muscle weakness (generalized): Secondary | ICD-10-CM | POA: Diagnosis not present

## 2019-03-16 DIAGNOSIS — R1312 Dysphagia, oropharyngeal phase: Secondary | ICD-10-CM | POA: Diagnosis not present

## 2019-03-19 DIAGNOSIS — R2689 Other abnormalities of gait and mobility: Secondary | ICD-10-CM | POA: Diagnosis not present

## 2019-03-19 DIAGNOSIS — R2681 Unsteadiness on feet: Secondary | ICD-10-CM | POA: Diagnosis not present

## 2019-03-19 DIAGNOSIS — R1312 Dysphagia, oropharyngeal phase: Secondary | ICD-10-CM | POA: Diagnosis not present

## 2019-03-19 DIAGNOSIS — I63312 Cerebral infarction due to thrombosis of left middle cerebral artery: Secondary | ICD-10-CM | POA: Diagnosis not present

## 2019-03-19 DIAGNOSIS — R41841 Cognitive communication deficit: Secondary | ICD-10-CM | POA: Diagnosis not present

## 2019-03-19 DIAGNOSIS — M6281 Muscle weakness (generalized): Secondary | ICD-10-CM | POA: Diagnosis not present

## 2019-03-20 DIAGNOSIS — R41841 Cognitive communication deficit: Secondary | ICD-10-CM | POA: Diagnosis not present

## 2019-03-20 DIAGNOSIS — I63312 Cerebral infarction due to thrombosis of left middle cerebral artery: Secondary | ICD-10-CM | POA: Diagnosis not present

## 2019-03-20 DIAGNOSIS — R2689 Other abnormalities of gait and mobility: Secondary | ICD-10-CM | POA: Diagnosis not present

## 2019-03-20 DIAGNOSIS — M6281 Muscle weakness (generalized): Secondary | ICD-10-CM | POA: Diagnosis not present

## 2019-03-20 DIAGNOSIS — R2681 Unsteadiness on feet: Secondary | ICD-10-CM | POA: Diagnosis not present

## 2019-03-20 DIAGNOSIS — R1312 Dysphagia, oropharyngeal phase: Secondary | ICD-10-CM | POA: Diagnosis not present

## 2019-03-21 DIAGNOSIS — I63312 Cerebral infarction due to thrombosis of left middle cerebral artery: Secondary | ICD-10-CM | POA: Diagnosis not present

## 2019-03-21 DIAGNOSIS — R41841 Cognitive communication deficit: Secondary | ICD-10-CM | POA: Diagnosis not present

## 2019-03-21 DIAGNOSIS — R2689 Other abnormalities of gait and mobility: Secondary | ICD-10-CM | POA: Diagnosis not present

## 2019-03-21 DIAGNOSIS — M6281 Muscle weakness (generalized): Secondary | ICD-10-CM | POA: Diagnosis not present

## 2019-03-21 DIAGNOSIS — R2681 Unsteadiness on feet: Secondary | ICD-10-CM | POA: Diagnosis not present

## 2019-03-21 DIAGNOSIS — R1312 Dysphagia, oropharyngeal phase: Secondary | ICD-10-CM | POA: Diagnosis not present

## 2019-03-22 DIAGNOSIS — I63312 Cerebral infarction due to thrombosis of left middle cerebral artery: Secondary | ICD-10-CM | POA: Diagnosis not present

## 2019-03-22 DIAGNOSIS — R41841 Cognitive communication deficit: Secondary | ICD-10-CM | POA: Diagnosis not present

## 2019-03-22 DIAGNOSIS — M6281 Muscle weakness (generalized): Secondary | ICD-10-CM | POA: Diagnosis not present

## 2019-03-22 DIAGNOSIS — R2689 Other abnormalities of gait and mobility: Secondary | ICD-10-CM | POA: Diagnosis not present

## 2019-03-22 DIAGNOSIS — R1312 Dysphagia, oropharyngeal phase: Secondary | ICD-10-CM | POA: Diagnosis not present

## 2019-03-22 DIAGNOSIS — R2681 Unsteadiness on feet: Secondary | ICD-10-CM | POA: Diagnosis not present

## 2019-04-04 DIAGNOSIS — G4701 Insomnia due to medical condition: Secondary | ICD-10-CM | POA: Diagnosis not present

## 2019-04-04 DIAGNOSIS — F419 Anxiety disorder, unspecified: Secondary | ICD-10-CM | POA: Diagnosis not present

## 2019-04-04 DIAGNOSIS — F331 Major depressive disorder, recurrent, moderate: Secondary | ICD-10-CM | POA: Diagnosis not present

## 2019-04-05 ENCOUNTER — Other Ambulatory Visit: Payer: Self-pay | Admitting: Radiation Oncology

## 2019-04-05 ENCOUNTER — Ambulatory Visit (HOSPITAL_COMMUNITY)
Admission: RE | Admit: 2019-04-05 | Discharge: 2019-04-05 | Disposition: A | Discharge status: Home / Self Care | Payer: Medicare Other | Source: Ambulatory Visit | Attending: Radiation Oncology | Admitting: Radiation Oncology

## 2019-04-05 ENCOUNTER — Other Ambulatory Visit: Payer: Self-pay

## 2019-04-05 DIAGNOSIS — J90 Pleural effusion, not elsewhere classified: Secondary | ICD-10-CM | POA: Diagnosis not present

## 2019-04-05 DIAGNOSIS — Z79899 Other long term (current) drug therapy: Secondary | ICD-10-CM | POA: Diagnosis not present

## 2019-04-05 DIAGNOSIS — C3411 Malignant neoplasm of upper lobe, right bronchus or lung: Secondary | ICD-10-CM

## 2019-04-05 DIAGNOSIS — R918 Other nonspecific abnormal finding of lung field: Secondary | ICD-10-CM | POA: Diagnosis not present

## 2019-04-05 DIAGNOSIS — R319 Hematuria, unspecified: Secondary | ICD-10-CM | POA: Diagnosis not present

## 2019-04-05 DIAGNOSIS — I700 Atherosclerosis of aorta: Secondary | ICD-10-CM | POA: Diagnosis not present

## 2019-04-05 DIAGNOSIS — N390 Urinary tract infection, site not specified: Secondary | ICD-10-CM | POA: Diagnosis not present

## 2019-04-05 DIAGNOSIS — E042 Nontoxic multinodular goiter: Secondary | ICD-10-CM | POA: Diagnosis not present

## 2019-04-05 LAB — POCT I-STAT CREATININE: Creatinine, Ser: 0.8 mg/dL (ref 0.44–1.00)

## 2019-04-05 MED ORDER — SODIUM CHLORIDE (PF) 0.9 % IJ SOLN
INTRAMUSCULAR | Status: AC
  Filled 2019-04-05: qty 50

## 2019-04-09 ENCOUNTER — Telehealth: Payer: Self-pay | Admitting: Radiation Oncology

## 2019-04-09 DIAGNOSIS — C3411 Malignant neoplasm of upper lobe, right bronchus or lung: Secondary | ICD-10-CM

## 2019-04-09 NOTE — Telephone Encounter (Signed)
I spoke with the patient's daughter and we discussed the reassuring CT results. She and her mother wish to continue with aggressive surveillance for now. Orders were placed for CT in 6 months.

## 2019-04-20 DIAGNOSIS — R54 Age-related physical debility: Secondary | ICD-10-CM | POA: Diagnosis not present

## 2019-04-20 DIAGNOSIS — E440 Moderate protein-calorie malnutrition: Secondary | ICD-10-CM | POA: Diagnosis not present

## 2019-04-20 DIAGNOSIS — M62838 Other muscle spasm: Secondary | ICD-10-CM | POA: Diagnosis not present

## 2019-04-20 DIAGNOSIS — F419 Anxiety disorder, unspecified: Secondary | ICD-10-CM | POA: Diagnosis not present

## 2019-04-20 DIAGNOSIS — R21 Rash and other nonspecific skin eruption: Secondary | ICD-10-CM | POA: Diagnosis not present

## 2019-04-20 DIAGNOSIS — E785 Hyperlipidemia, unspecified: Secondary | ICD-10-CM | POA: Diagnosis not present

## 2019-04-20 DIAGNOSIS — J309 Allergic rhinitis, unspecified: Secondary | ICD-10-CM | POA: Diagnosis not present

## 2019-04-20 DIAGNOSIS — R32 Unspecified urinary incontinence: Secondary | ICD-10-CM | POA: Diagnosis not present

## 2019-04-20 DIAGNOSIS — E559 Vitamin D deficiency, unspecified: Secondary | ICD-10-CM | POA: Diagnosis not present

## 2019-04-20 DIAGNOSIS — Z85118 Personal history of other malignant neoplasm of bronchus and lung: Secondary | ICD-10-CM | POA: Diagnosis not present

## 2019-04-20 DIAGNOSIS — M542 Cervicalgia: Secondary | ICD-10-CM | POA: Diagnosis not present

## 2019-04-20 DIAGNOSIS — I4891 Unspecified atrial fibrillation: Secondary | ICD-10-CM | POA: Diagnosis not present

## 2019-04-23 DIAGNOSIS — E039 Hypothyroidism, unspecified: Secondary | ICD-10-CM | POA: Diagnosis not present

## 2019-05-15 DIAGNOSIS — K5900 Constipation, unspecified: Secondary | ICD-10-CM | POA: Diagnosis not present

## 2019-05-15 DIAGNOSIS — M818 Other osteoporosis without current pathological fracture: Secondary | ICD-10-CM | POA: Diagnosis not present

## 2019-05-15 DIAGNOSIS — J449 Chronic obstructive pulmonary disease, unspecified: Secondary | ICD-10-CM | POA: Diagnosis not present

## 2019-05-15 DIAGNOSIS — C3411 Malignant neoplasm of upper lobe, right bronchus or lung: Secondary | ICD-10-CM | POA: Diagnosis not present

## 2019-05-15 DIAGNOSIS — I69398 Other sequelae of cerebral infarction: Secondary | ICD-10-CM | POA: Diagnosis not present

## 2019-05-15 DIAGNOSIS — N39498 Other specified urinary incontinence: Secondary | ICD-10-CM | POA: Diagnosis not present

## 2019-05-15 DIAGNOSIS — K219 Gastro-esophageal reflux disease without esophagitis: Secondary | ICD-10-CM | POA: Diagnosis not present

## 2019-05-15 DIAGNOSIS — R54 Age-related physical debility: Secondary | ICD-10-CM | POA: Diagnosis not present

## 2019-05-15 DIAGNOSIS — H04123 Dry eye syndrome of bilateral lacrimal glands: Secondary | ICD-10-CM | POA: Diagnosis not present

## 2019-05-15 DIAGNOSIS — E785 Hyperlipidemia, unspecified: Secondary | ICD-10-CM | POA: Diagnosis not present

## 2019-05-15 DIAGNOSIS — I4891 Unspecified atrial fibrillation: Secondary | ICD-10-CM | POA: Diagnosis not present

## 2019-05-15 DIAGNOSIS — G8929 Other chronic pain: Secondary | ICD-10-CM | POA: Diagnosis not present

## 2019-05-16 DIAGNOSIS — G4701 Insomnia due to medical condition: Secondary | ICD-10-CM | POA: Diagnosis not present

## 2019-05-16 DIAGNOSIS — F0150 Vascular dementia without behavioral disturbance: Secondary | ICD-10-CM | POA: Diagnosis not present

## 2019-05-16 DIAGNOSIS — F331 Major depressive disorder, recurrent, moderate: Secondary | ICD-10-CM | POA: Diagnosis not present

## 2019-05-16 DIAGNOSIS — F064 Anxiety disorder due to known physiological condition: Secondary | ICD-10-CM | POA: Diagnosis not present

## 2019-05-17 DIAGNOSIS — I10 Essential (primary) hypertension: Secondary | ICD-10-CM | POA: Diagnosis not present

## 2019-05-17 DIAGNOSIS — N390 Urinary tract infection, site not specified: Secondary | ICD-10-CM | POA: Diagnosis not present

## 2019-05-17 DIAGNOSIS — D649 Anemia, unspecified: Secondary | ICD-10-CM | POA: Diagnosis not present

## 2019-05-17 DIAGNOSIS — R319 Hematuria, unspecified: Secondary | ICD-10-CM | POA: Diagnosis not present

## 2019-05-17 DIAGNOSIS — Z79899 Other long term (current) drug therapy: Secondary | ICD-10-CM | POA: Diagnosis not present

## 2019-05-21 DIAGNOSIS — G4701 Insomnia due to medical condition: Secondary | ICD-10-CM | POA: Diagnosis not present

## 2019-05-21 DIAGNOSIS — N390 Urinary tract infection, site not specified: Secondary | ICD-10-CM | POA: Diagnosis not present

## 2019-05-21 DIAGNOSIS — F0150 Vascular dementia without behavioral disturbance: Secondary | ICD-10-CM | POA: Diagnosis not present

## 2019-05-21 DIAGNOSIS — N39498 Other specified urinary incontinence: Secondary | ICD-10-CM | POA: Diagnosis not present

## 2019-05-21 DIAGNOSIS — F331 Major depressive disorder, recurrent, moderate: Secondary | ICD-10-CM | POA: Diagnosis not present

## 2019-05-21 DIAGNOSIS — I69398 Other sequelae of cerebral infarction: Secondary | ICD-10-CM | POA: Diagnosis not present

## 2019-05-21 DIAGNOSIS — R54 Age-related physical debility: Secondary | ICD-10-CM | POA: Diagnosis not present

## 2019-05-21 DIAGNOSIS — E785 Hyperlipidemia, unspecified: Secondary | ICD-10-CM | POA: Diagnosis not present

## 2019-05-21 DIAGNOSIS — F064 Anxiety disorder due to known physiological condition: Secondary | ICD-10-CM | POA: Diagnosis not present

## 2019-05-21 DIAGNOSIS — I4891 Unspecified atrial fibrillation: Secondary | ICD-10-CM | POA: Diagnosis not present

## 2019-05-21 DIAGNOSIS — H04123 Dry eye syndrome of bilateral lacrimal glands: Secondary | ICD-10-CM | POA: Diagnosis not present

## 2019-05-21 DIAGNOSIS — M818 Other osteoporosis without current pathological fracture: Secondary | ICD-10-CM | POA: Diagnosis not present

## 2019-06-04 ENCOUNTER — Telehealth: Payer: Self-pay | Admitting: Neurology

## 2019-06-04 NOTE — Telephone Encounter (Signed)
Patient daughter called stating that they would like to do a virtual visit since she is in the nursing home. The email with the lady that will do the virtual visit with the patient is [email or web address]

## 2019-06-04 NOTE — Telephone Encounter (Signed)
I have sent the email to the email provided with the [email or web address] link. I will make sure we check the patient in prior to the apt.

## 2019-06-06 ENCOUNTER — Ambulatory Visit (INDEPENDENT_AMBULATORY_CARE_PROVIDER_SITE_OTHER): Payer: Medicare Other | Admitting: Neurology

## 2019-06-06 ENCOUNTER — Other Ambulatory Visit: Payer: Self-pay

## 2019-06-06 ENCOUNTER — Encounter: Payer: Self-pay | Admitting: Neurology

## 2019-06-06 DIAGNOSIS — G40209 Localization-related (focal) (partial) symptomatic epilepsy and epileptic syndromes with complex partial seizures, not intractable, without status epilepticus: Secondary | ICD-10-CM | POA: Diagnosis not present

## 2019-06-06 DIAGNOSIS — I4821 Permanent atrial fibrillation: Secondary | ICD-10-CM | POA: Diagnosis not present

## 2019-06-06 DIAGNOSIS — I69359 Hemiplegia and hemiparesis following cerebral infarction affecting unspecified side: Secondary | ICD-10-CM | POA: Diagnosis not present

## 2019-06-06 DIAGNOSIS — I6330 Cerebral infarction due to thrombosis of unspecified cerebral artery: Secondary | ICD-10-CM

## 2019-06-06 MED ORDER — LEVETIRACETAM 250 MG PO TABS: 250.0000 mg | ORAL_TABLET | Freq: Two times a day (BID) | ORAL | 3 refills | Status: AC

## 2019-06-06 NOTE — Progress Notes (Signed)
Virtual Visit via Video Note  I connected with Kayla Kent on 06/06/19 at 10:30 AM EDT by a video enabled telemedicine application and verified that I am speaking with the correct person using two identifiers.  Location: Patient: at her retirement/ nursing home.  Provider: at [facility]    I discussed the limitations of evaluation and management by telemedicine and the availability of in person appointments. The patient expressed understanding and agreed to proceed.    Larey Seat, MD    PATIENT: Kayla Kent DOB: 1932-06-07  REASON FOR VISIT: follow up-  Originally seen for seizures, and now after STROKE in 2018.   HISTORY FROM: patient and CNA at her senior residence.   HISTORY OF PRESENT ILLNESS:  I have the pleasure of meeting Kayla Kent today for her yearly revisit on 05 December 2018.  She is meanwhile 85 years old. The patient now ambulates with the help of a walker, her daughter accompanied her to this visit.  Patient underwent a blepharoplasty for the lower eyelid of the right eye, after having corneal abrasion from her inverted lashes.  She had vaginal bleeding, Gyn evaluation did not show why. Her blood pressure is low.  She resides now at [facility]. She has been anemic. She had a blood test at [facility], but I have no access to the results.     Interval history from 30 May 2018.  08/13/19I have the pleasure of seeing Kayla Kent. Kent today, and meanwhile 85 year old Caucasian female patient whom I have followed for the last 4 or even 5 years.  Usually we have followed for seizures, but at this time her medical interval history has been quite different.  Her daughter gives me the following information she states that her mother fell at home was brought to the [facility] emergency room on 20 November.   The patient had been typically anticoagulated due to atrial fibrillation, diastolic heart failure, these anticoagulants were discontinued as the emergency room  provider stated that the patient arrived confused and was had bloody emesis- a fact the patient denies, she stated she had thrown up tomato soup.  The patient was scheduled to be evaluated for a presumed bleeding ulcer or gastritis, and an EGD was performed on 1123- 3 days after her admission.  It had been decided that she should also have a colonoscopy, but the colonoscopy never took place.  In the meantime the patient became confused indeed and it turned out that she developed a urinary tract infection with urosepsis.  According to her daughter she was very confused appeared almost delirious. She was tachycardic, in atrial fibrillation, hypoxemic.   The patient was first considered to be discharged into a regular care unit had now to be transferred to an ICU setting.  On 1125 during shift change at 7 PM the staff noted that she was aphasic acutely hemiplegic on her right and had respiratory distress. A code stroke was called, CT revealed an hyperdense sign in the MCA on the left.  The patient was not considered for TPA because of the presumed GI bleed even as her Hct/ Hgb was stabile. She underwent thrombectomy .  She recovered afterwards very slowly. She developed an aspiration pneumonia.  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  She developed urosepsis out of a UTI which was present at the time of  admission. She developed aspiration pneumonia after the hemiparetic stroke.     05-09-2017, Kayla Kent is a meanwhile 85 year old sprite full female with a history of seizures that began only 4 years ago. She has continued to take Keppra 250 mA twice a day with good success. She lives independently he can perform all ADLs independently, she drives. She enjoys eating out. Participates in exercise classes and has a Physiological scientist 2 days a week. Her grandchildren attend [employer] and [employer].   Kayla Kent is an 85 year old female with a history of seizures. She returns today for follow-up. She continues on Keppra 250 mg twice a day. No seizure events. Able to complete all ADLs independently. Lives at home alone. No changes in mood or behavior. No change in gait or balance. She is able to prepare her own meals. Handles her own finances without difficulty. Participates in an exercise class. Reports that she is very social. Denies any new neurological symptoms. Returns today for an evaluation.  HISTORY 05/06/15 (MM): Kayla Kent is an 85 year old female with a history of seizures. She returns today for an evaluation. The patient has remained on Keppra 250 mg twice a day. She denies any seizure events. She operates a Teacher, music without difficulty. He is able to complete all ADLs independently. Since the last visit she did have a fall while doing yardwork and unfortunately fractured her right wrist and right hip. She underwent surgery and has recovered quite nicely. She continues to live alone although now she has help with yard work. Denies any new neurological symptoms. She returns today for an evaluation.  HISTORY 03/28/14 (CD): Kayla Kent is a 85 y.o., caucasian , widowed female here as a yearly routine follow up upon initial referral from Dr. Linda Hedges for seizure versus syncope.The patient was originally sent to [facility] in 2013 for anevaluation of possible seizures. The first spell occurred in March 2013 , while in the [place].  The patient was sitting on a bench in the evening watching relatives play ball and suddenly had " this feeling of being pulled forward", her vision changed to gray, she could hear people around her -but could not respond. Her daughter witnessed the event and stated that her mother did not fall from the chair , but she threw her hands up for seconds, did not respond verbally.  The patient underwent a glucose check and  electrolyte check at the ED, which were normal. Then she had a second event during a track meet ,watching her grandson run and the same spell happened to her. She recalls a woman, standing near to her, who was a nurse And checked her heart beat.  She came quickly back to normal levels of awareness . She never convulsed. She also overheard when another person in the bleachers announced she Will call 911 and she responded verbally : " no".  There was no Confusion, impairment or any other symptoms . There were no cardiac events- after Dr. Linda Hedges evaluated the Patient.  Again, differential diagnosis included syncope. Dr. Jacelyn Grip told her that he did not establish a diagnosis , but he started her on Keppra , ( for seizures ) she worked up to 2 tablets in the morning and 2 in p.m. She had no side effects reported at the time. A MRI of the brain documented right anterior temporal encephalomalacia. EEG was non conclusive.  REVIEW OF SYSTEMS: Out of a complete 14 system review of symptoms, the patient  complains only of the following symptoms, and all other reviewed systems are negative.  Frequency of urination, environmental allergies, food allergies, bruise easily, lazy eye.   ALLERGIES: Allergies  Allergen Reactions  . Chocolate Hives  . Codeine Other (See Comments)    Patient states she acts crazy  . Diazepam Other (See Comments)    Patient states she acts crazy.  . Fruit & Vegetable Daily [Nutritional Supplements] Hives and Swelling    peaches  . Iohexol Hives     Code: HIVES, Desc: pt gets 13 hr pre-meds, Onset Date: [id number]   . Latex Hives  . Peach [Prunus Persica] Hives  . Peanut-Containing Drug Products Hives and Swelling  . Penicillins Hives, Itching and Swelling    Has patient had a PCN reaction causing immediate rash, facial/tongue/throat swelling, SOB or lightheadedness with hypotension: Yes Has patient had a PCN reaction causing severe rash involving mucus membranes or  skin necrosis: No Has patient had a PCN reaction that required hospitalization No Has patient had a PCN reaction occurring within the last 10 years: No If all of the above answers are "NO", then may proceed with Cephalosporin use.   . Strawberry Extract Hives  . Sulfonamide Derivatives Hives    nausea  . Rosuvastatin Calcium   . Sulfa Antibiotics   . Sulfamethoxazole Hives and Other (See Comments)  . Crestor [Rosuvastatin Calcium] Rash  . Iodine Rash  . Wheat Other (See Comments)    Daughter states this is an error, that she is not allergic to wheat products  . Wheat Bran Other (See Comments)    Patient family states this is an error- she is not allergic to wheat products    HOME MEDICATIONS: Outpatient Medications Prior to Visit  Medication Sig Dispense Refill  . acetaminophen (TYLENOL) 325 MG tablet Take 650 mg by mouth every 4 (four) hours as needed.    Marland Kitchen acetaminophen (TYLENOL) 500 MG tablet Take 1,000 mg by mouth every 8 (eight) hours as needed.    Marland Kitchen amLODipine (NORVASC) 2.5 MG tablet Take 2.5 mg by mouth daily.    Marland Kitchen apixaban (ELIQUIS) 2.5 MG TABS tablet Take 2.5 mg by mouth 2 (two) times daily.    . ARTIFICIAL TEAR OP Apply to eye as directed.    Marland Kitchen atorvastatin (LIPITOR) 10 MG tablet Take 10 mg by mouth daily.     . bethanechol (URECHOLINE) 10 MG tablet Take 1 tablet (10 mg total) by mouth 3 (three) times daily.    . bisacodyl (DULCOLAX) 10 MG suppository Place 1 suppository (10 mg total) rectally daily as needed for moderate constipation. 12 suppository 0  . chlorhexidine gluconate, MEDLINE KIT, (PERIDEX) 0.12 % solution 15 mLs by Mouth Rinse route 2 (two) times daily. 120 mL 0  . diltiazem (DILACOR XR) 180 MG 24 hr capsule Take 180 mg by mouth daily.    . Ensure (ENSURE) Take 237 mLs by mouth 3 (three) times daily between meals.    . ergocalciferol (VITAMIN D2) 1.25 MG (50000 UT) capsule Take 50,000 Units by mouth once a week.    . escitalopram (LEXAPRO) 5 MG tablet Take 1  tablet (5 mg total) by mouth daily. (Patient taking differently: Take 10 mg by mouth daily. )    . fluticasone (FLONASE) 50 MCG/ACT nasal spray     . GUAIFENESIN PO Take 10 mLs by mouth as needed.    . hydrocortisone cream 1 % Apply 1 application topically as directed.    . levETIRAcetam (KEPPRA) 250 MG  tablet Take 250 mg by mouth 2 (two) times daily.    Marland Kitchen LORAZEPAM PO Take 0.375 mg by mouth at bedtime.    . Melatonin 3 MG TABS Take 0.5 tablets (1.5 mg total) by mouth at bedtime. (Patient taking differently: Take 3 mg by mouth at bedtime. )  0  . METHOCARBAMOL PO Take by mouth as directed.    Marland Kitchen METOPROLOL TARTRATE PO Take 12.5 mg by mouth 2 (two) times daily.     . NYSTATIN powder     . omeprazole (PRILOSEC) 20 MG capsule Take 20 mg by mouth daily.    . ondansetron (ZOFRAN) 4 MG tablet Take 1 tablet (4 mg total) by mouth every 6 (six) hours as needed for nausea or vomiting. 20 tablet 0  . polyethylene glycol (MIRALAX / GLYCOLAX) 17 g packet Take 17 g by mouth daily.    Orlie Dakin Sodium (SENNA-DOCUSATE SODIUM PO) Take by mouth as directed.    . Sodium Phosphates (FLEET ENEMA RE) Place rectally as directed.    . traMADol (ULTRAM) 50 MG tablet Take 50 mg by mouth every 6 (six) hours as needed.     No facility-administered medications prior to visit.     PAST MEDICAL HISTORY: Past Medical History:  Diagnosis Date  . Allergic rhinitis   . Anxiety   . CKD (chronic kidney disease) stage 3, GFR 30-59 ml/min (HCC)   . Emphysema of lung ([place])   . HLD (hyperlipidemia)   . HTN (hypertension)   . Intolerance of drug    orthostatic  . Paroxysmal supraventricular tachycardia ([place])   . PVD (peripheral vascular disease) ([place])   . Seizure ([place])   . Syncope and collapse   . Uterine prolapse without mention of vaginal wall prolapse     PAST SURGICAL HISTORY: Past Surgical History:  Procedure Laterality Date  . CARDIOVERSION N/A 11/05/2014   Procedure: CARDIOVERSION;  Surgeon: Candee Furbish, MD;  Location: [facility] ENDOSCOPY;  Service: Cardiovascular;  Laterality: N/A;  . cataract surgery  08/2015  . CHOLECYSTECTOMY    . corrective eye surgery     as a child  . ESOPHAGOGASTRODUODENOSCOPY N/A 09/09/2017   Procedure: ESOPHAGOGASTRODUODENOSCOPY (EGD);  Surgeon: Mauri Pole, MD;  Location: [facility] ENDOSCOPY;  Service: Endoscopy;  Laterality: N/A;  . EYE SURGERY  08/2018  . INTRAMEDULLARY (IM) NAIL INTERTROCHANTERIC Left 11/21/2016   Procedure: INTRAMEDULLARY (IM) NAIL INTERTROCHANTRIC HEMI;  Surgeon: Newt Minion, MD;  Location: [place];  Service: Orthopedics;  Laterality: Left;  . IR GASTROSTOMY TUBE MOD SED  09/26/2017  . IR PERCUTANEOUS ART THROMBECTOMY/INFUSION INTRACRANIAL INC DIAG ANGIO  09/11/2017  . IVD removed    . OPEN REDUCTION INTERNAL FIXATION (ORIF) DISTAL RADIAL FRACTURE Right 08/29/2014   Procedure: OPEN REDUCTION INTERNAL FIXATION (ORIF) DISTAL RADIAL FRACTURE;  Surgeon: Marianna Payment, MD;  Location: [place];  Service: Orthopedics;  Laterality: Right;  . RADIOLOGY WITH ANESTHESIA N/A 09/11/2017   Procedure: RADIOLOGY WITH ANESTHESIA;  Surgeon: Luanne Bras, MD;  Location: [place];  Service: Radiology;  Laterality: N/A;  . RF ablation PSVT     summer '10  . stress cardiolite  08/05/93  . TONSILLECTOMY    . TOTAL HIP ARTHROPLASTY Right 08/29/2014   Procedure: Right Hip Hemi Arthroplasty;  Surgeon: Marianna Payment, MD;  Location: [place];  Service: Orthopedics;  Laterality: Right;  Hip procedure 1st wants Peg Board, [employer], [place].     FAMILY HISTORY: Family History  Problem Relation Age of Onset  .  Mental illness Father        suicide  . Arthritis Father   . Hyperlipidemia Sister   . Hypertension Sister   . Heart disease Brother        CAD/MI  . Hypertension Brother   . Hypertension Other        family hx  . Colon cancer Neg Hx   . Breast cancer Neg Hx   . Diabetes Neg Hx   . Stroke Neg Hx   . Cancer Neg Hx   . Lung disease Neg  Hx     SOCIAL HISTORY: Social History   Socioeconomic History  . Marital status: Widowed    Spouse name: Not on file  . Number of children: 2  . Years of education: 12  . Highest education level: Not on file  Occupational History  . Occupation: retired Radiation protection practitioner  . Financial resource strain: Not hard at all  . Food insecurity    Worry: Never true    Inability: Never true  . Transportation needs    Medical: No    Non-medical: No  Tobacco Use  . Smoking status: Former Smoker    Packs/day: 0.25    Years: 38.00    Pack years: 9.50    Start date: 02/15/1952    Quit date: 10/18/1993    Years since quitting: 25.6  . Smokeless tobacco: Never Used  Substance and Sexual Activity  . Alcohol use: No    Alcohol/week: 0.0 standard drinks  . Drug use: No  . Sexual activity: Never  Lifestyle  . Physical activity    Days per week: 4 days    Minutes per session: 40 min  . Stress: Only a little  Relationships  . Social connections    Talks on phone: More than three times a week    Gets together: More than three times a week    Attends religious service: 1 to 4 times per year    Active member of club or organization: Yes    Attends meetings of clubs or organizations: More than 4 times per year    Relationship status: Widowed  . Intimate partner violence    Fear of current or ex partner: Not on file    Emotionally abused: Not on file    Physically abused: Not on file    Forced sexual activity: Not on file  Other Topics Concern  . Not on file  Social History Narrative   [employer], [employer]-BA, [employer]-early childhood. Married '55 -54 years.  1 son - 38; 1 daughter - 50; 3 grandchildren . Lives alone. ACP - discussed and provided packet on end of life care (Feb '13)   Patient is now widowed.   Patient is right-handed.   Patient drinks tea daily.      [facility] (04/26/17):   Originally from [facility]. Previously was a Pharmacist, hospital. No pets currently. No mold  exposure. No bird exposure.     OBSERVATION:  Patient is saeted in a wheel chair, I can only see upper body and face. She has visibly a facial droop to the right  Has the disrounded pupil as seen in all previous visits, the lazy eye.  she moved her upper extremities for me , waving to the camera. She has spoken clearly but in low volume voice.  She denied having had any recurrent seizure activity, syncopal episodes or near fainting spells.      DIAGNOSTIC DATA (LABS, IMAGING, TESTING) - I reviewed  patient records, labs, notes, testing and imaging myself where available.  Patient has regained weight, has not been given anti-hypertensives and was 113/ 55 mmHg today. 12-05-2018.   ASSESSMENT AND PLAN 85 y.o. year old and with history of multiple medical problems, in frail health since November 2018 Seizures   1)Seizures manifesting with sudden collapse- have not returned since she takes Keppra , low dose.  She is now off medication - no more Keppra listed.  She is no longer driving.  2) stroke symptoms unchanged. 3) hypotension by report , unclear why- no longer on hypertension medication.  She had vaginal bleeding-  4) CKD- no recent metabolic panel found.  5) atrial fib on anticoagulation.     I refilled her medication and will make a RV with Np in 6 month.  Follow Up Instructions: I discussed the assessment and treatment plan with the patient. The patient was provided an opportunity to ask questions and all were answered. The patient agreed with the plan and demonstrated an understanding of the instructions.   The patient was advised to call back or seek an in-person evaluation if the symptoms worsen or if the condition fails to improve as anticipated.  I provided 12 minutes of non-face-to-face time during this encounter.   Larey Seat, MD  06/06/2019, 11:10 AM [facility] [address], [place], [place] [address] [phone]    Cc Dorris Carnes,  MD

## 2019-06-17 DIAGNOSIS — Z03818 Encounter for observation for suspected exposure to other biological agents ruled out: Secondary | ICD-10-CM | POA: Diagnosis not present

## 2019-06-27 DIAGNOSIS — Z1159 Encounter for screening for other viral diseases: Secondary | ICD-10-CM | POA: Diagnosis not present

## 2019-06-27 DIAGNOSIS — F064 Anxiety disorder due to known physiological condition: Secondary | ICD-10-CM | POA: Diagnosis not present

## 2019-06-27 DIAGNOSIS — G4701 Insomnia due to medical condition: Secondary | ICD-10-CM | POA: Diagnosis not present

## 2019-06-27 DIAGNOSIS — F331 Major depressive disorder, recurrent, moderate: Secondary | ICD-10-CM | POA: Diagnosis not present

## 2019-06-27 DIAGNOSIS — F0150 Vascular dementia without behavioral disturbance: Secondary | ICD-10-CM | POA: Diagnosis not present

## 2019-06-28 DIAGNOSIS — N390 Urinary tract infection, site not specified: Secondary | ICD-10-CM | POA: Diagnosis not present

## 2019-06-28 DIAGNOSIS — R319 Hematuria, unspecified: Secondary | ICD-10-CM | POA: Diagnosis not present

## 2019-06-28 DIAGNOSIS — Z79899 Other long term (current) drug therapy: Secondary | ICD-10-CM | POA: Diagnosis not present

## 2019-07-03 DIAGNOSIS — Z03818 Encounter for observation for suspected exposure to other biological agents ruled out: Secondary | ICD-10-CM | POA: Diagnosis not present

## 2019-07-06 DIAGNOSIS — R54 Age-related physical debility: Secondary | ICD-10-CM | POA: Diagnosis not present

## 2019-07-06 DIAGNOSIS — E785 Hyperlipidemia, unspecified: Secondary | ICD-10-CM | POA: Diagnosis not present

## 2019-07-06 DIAGNOSIS — N390 Urinary tract infection, site not specified: Secondary | ICD-10-CM | POA: Diagnosis not present

## 2019-07-06 DIAGNOSIS — M818 Other osteoporosis without current pathological fracture: Secondary | ICD-10-CM | POA: Diagnosis not present

## 2019-07-06 DIAGNOSIS — F064 Anxiety disorder due to known physiological condition: Secondary | ICD-10-CM | POA: Diagnosis not present

## 2019-07-06 DIAGNOSIS — G4701 Insomnia due to medical condition: Secondary | ICD-10-CM | POA: Diagnosis not present

## 2019-07-06 DIAGNOSIS — H04123 Dry eye syndrome of bilateral lacrimal glands: Secondary | ICD-10-CM | POA: Diagnosis not present

## 2019-07-06 DIAGNOSIS — I69398 Other sequelae of cerebral infarction: Secondary | ICD-10-CM | POA: Diagnosis not present

## 2019-07-06 DIAGNOSIS — I4891 Unspecified atrial fibrillation: Secondary | ICD-10-CM | POA: Diagnosis not present

## 2019-07-06 DIAGNOSIS — N39498 Other specified urinary incontinence: Secondary | ICD-10-CM | POA: Diagnosis not present

## 2019-07-06 DIAGNOSIS — F331 Major depressive disorder, recurrent, moderate: Secondary | ICD-10-CM | POA: Diagnosis not present

## 2019-07-06 DIAGNOSIS — F0150 Vascular dementia without behavioral disturbance: Secondary | ICD-10-CM | POA: Diagnosis not present

## 2019-07-10 DIAGNOSIS — M810 Age-related osteoporosis without current pathological fracture: Secondary | ICD-10-CM | POA: Diagnosis not present

## 2019-07-10 DIAGNOSIS — I482 Chronic atrial fibrillation, unspecified: Secondary | ICD-10-CM | POA: Diagnosis not present

## 2019-07-10 DIAGNOSIS — R54 Age-related physical debility: Secondary | ICD-10-CM | POA: Diagnosis not present

## 2019-07-10 DIAGNOSIS — I69398 Other sequelae of cerebral infarction: Secondary | ICD-10-CM | POA: Diagnosis not present

## 2019-07-10 DIAGNOSIS — R32 Unspecified urinary incontinence: Secondary | ICD-10-CM | POA: Diagnosis not present

## 2019-07-11 DIAGNOSIS — F064 Anxiety disorder due to known physiological condition: Secondary | ICD-10-CM | POA: Diagnosis not present

## 2019-07-11 DIAGNOSIS — F331 Major depressive disorder, recurrent, moderate: Secondary | ICD-10-CM | POA: Diagnosis not present

## 2019-07-11 DIAGNOSIS — F0150 Vascular dementia without behavioral disturbance: Secondary | ICD-10-CM | POA: Diagnosis not present

## 2019-07-11 DIAGNOSIS — G4701 Insomnia due to medical condition: Secondary | ICD-10-CM | POA: Diagnosis not present

## 2019-07-16 DIAGNOSIS — Z79899 Other long term (current) drug therapy: Secondary | ICD-10-CM | POA: Diagnosis not present

## 2019-07-31 DIAGNOSIS — Z1159 Encounter for screening for other viral diseases: Secondary | ICD-10-CM | POA: Diagnosis not present

## 2019-07-31 NOTE — Progress Notes (Signed)
Virtual Visit via Telephone Note   This visit type was conducted due to national recommendations for restrictions regarding the COVID-19 Pandemic (e.g. social distancing) in an effort to limit this patient's exposure and mitigate transmission in our community.  Due to her co-morbid illnesses, this patient is at least at moderate risk for complications without adequate follow up.  This format is felt to be most appropriate for this patient at this time.  The patient did not have access to video technology/had technical difficulties with video requiring transitioning to audio format only (telephone).  All issues noted in this document were discussed and addressed.  No physical exam could be performed with this format.  Please refer to the patient's chart for her  consent to telehealth for [facility].   Date:  08/01/2019   ID:  Kayla Kent, DOB 08-Dec-1931, MRN [id number]  Patient Location: [place] Provider Location: Home  PCP:  Biagio Borg, MD  Cardiologist:  Dorris Carnes, MD Electrophysiologist:  None   Evaluation Performed:  Follow-Up Visit  Chief Complaint:  Follow up  History of Present Illness:    Kayla Kent is a 85 y.o. female with hx of PAF, HTN, CVA, PFO, GI bleeding, CKD III, chronic diastolic HF, RUL lung cancer s/p radiation, CVA (MCA ischemic stroke, seixure and HLD seen for follow up.  Patient was hospitalized from 11/20-11/30/2018 where she was found to have an UGIB s/p EGD revealing gastritis. Also found to have ESBL UTI with sepsis. Hospital course complicated by CVA (MCA ischemic stroke) 09/11/17, likely 2/2 discontinuation of her eliquis in the setting of acute UGIB and plans for procedures, for which she underwent mechanical thrombectomy by IR. Course additionally complicated by aspiration PNA requiring short term intubation for respiratory failure. Patient was transferred to inpatient rehab on 09/16/17. Unfortunately patient was readmitted to [facility]  09/19/17 for a 2nd episode of aspiration PNA.  Myoview 04/2017 without evidence of ischemia.   Last seen by Dr. Harrington Challenger 02/2019 virtually.   Patient currently resides at facility.  Spoke with her over phone with help of nurse.  She has no complaint.  Says "I am healthy".  No chest pain, shortness of breath, palpitation, dizziness, orthopnea, PND, syncope or lower extremity edema.  She is mostly wheelchair-bound but does walk with walker and participates in activity.  The patient does not have symptoms concerning for COVID-19 infection (fever, chills, cough, or new shortness of breath).    Past Medical History:  Diagnosis Date   Allergic rhinitis    Anxiety    CKD (chronic kidney disease) stage 3, GFR 30-59 ml/min    Emphysema of lung (HCC)    HLD (hyperlipidemia)    HTN (hypertension)    Intolerance of drug    orthostatic   Paroxysmal supraventricular tachycardia (HCC)    PVD (peripheral vascular disease) (HCC)    Seizure (HCC)    Syncope and collapse    Uterine prolapse without mention of vaginal wall prolapse    Past Surgical History:  Procedure Laterality Date   CARDIOVERSION N/A 11/05/2014   Procedure: CARDIOVERSION;  Surgeon: Candee Furbish, MD;  Location: [facility] ENDOSCOPY;  Service: Cardiovascular;  Laterality: N/A;   cataract surgery  08/2015   CHOLECYSTECTOMY     corrective eye surgery     as a child   ESOPHAGOGASTRODUODENOSCOPY N/A 09/09/2017   Procedure: ESOPHAGOGASTRODUODENOSCOPY (EGD);  Surgeon: Mauri Pole, MD;  Location: [facility] ENDOSCOPY;  Service: Endoscopy;  Laterality: N/A;   EYE SURGERY  08/2018   INTRAMEDULLARY (IM) NAIL INTERTROCHANTERIC Left 11/21/2016   Procedure: INTRAMEDULLARY (IM) NAIL INTERTROCHANTRIC HEMI;  Surgeon: Newt Minion, MD;  Location: [place];  Service: Orthopedics;  Laterality: Left;   IR GASTROSTOMY TUBE MOD SED  09/26/2017   IR PERCUTANEOUS ART THROMBECTOMY/INFUSION INTRACRANIAL INC DIAG ANGIO  09/11/2017   IVD removed      OPEN REDUCTION INTERNAL FIXATION (ORIF) DISTAL RADIAL FRACTURE Right 08/29/2014   Procedure: OPEN REDUCTION INTERNAL FIXATION (ORIF) DISTAL RADIAL FRACTURE;  Surgeon: Marianna Payment, MD;  Location: [place];  Service: Orthopedics;  Laterality: Right;   RADIOLOGY WITH ANESTHESIA N/A 09/11/2017   Procedure: RADIOLOGY WITH ANESTHESIA;  Surgeon: Luanne Bras, MD;  Location: [place];  Service: Radiology;  Laterality: N/A;   RF ablation PSVT     summer '10   stress cardiolite  08/05/93   TONSILLECTOMY     TOTAL HIP ARTHROPLASTY Right 08/29/2014   Procedure: Right Hip Hemi Arthroplasty;  Surgeon: Marianna Payment, MD;  Location: [place];  Service: Orthopedics;  Laterality: Right;  Hip procedure 1st wants [employer], [employer], [place].     Prior to Admission medications   Medication Sig Start Date End Date Taking? Authorizing Provider  acetaminophen (TYLENOL) 325 MG tablet Take 650 mg by mouth every 4 (four) hours as needed.   Yes [provider]  acetaminophen (TYLENOL) 500 MG tablet Take 1,000 mg by mouth every 8 (eight) hours as needed.   Yes [provider]  amLODipine (NORVASC) 2.5 MG tablet Take 2.5 mg by mouth daily.   Yes [provider]  apixaban (ELIQUIS) 2.5 MG TABS tablet Take 2.5 mg by mouth 2 (two) times daily.   Yes [provider]  ARTIFICIAL TEAR OP Apply to eye as directed.   Yes [provider]  atorvastatin (LIPITOR) 10 MG tablet Take 10 mg by mouth daily.  05/08/18  Yes [provider]  bethanechol (URECHOLINE) 10 MG tablet Take 1 tablet (10 mg total) by mouth 3 (three) times daily. 10/24/17  Yes Love, Ivan Anchors, PA-C  bisacodyl (DULCOLAX) 10 MG suppository Place 1 suppository (10 mg total) rectally daily as needed for moderate constipation. 09/16/17  Yes Erick Colace, NP  Ensure (ENSURE) Take 237 mLs by mouth 3 (three) times daily between meals.   Yes [provider]  ergocalciferol (VITAMIN D2) 1.25  MG (50000 UT) capsule Take 50,000 Units by mouth once a week.   Yes [provider]  escitalopram (LEXAPRO) 5 MG tablet Take 1 tablet (5 mg total) by mouth daily. Patient taking differently: Take 10 mg by mouth daily.  10/25/17  Yes Love, Ivan Anchors, PA-C  fluticasone (FLONASE) 50 MCG/ACT nasal spray  12/04/18  Yes [provider]  GUAIFENESIN PO Take 10 mLs by mouth as needed.   Yes [provider]  hydrocortisone cream 1 % Apply 1 application topically as directed.   Yes [provider]  levETIRAcetam (KEPPRA) 250 MG tablet Take 1 tablet (250 mg total) by mouth 2 (two) times daily. 06/06/19  Yes Dohmeier, Asencion Partridge, MD  LORAZEPAM PO Take 0.375 mg by mouth 2 (two) times daily as needed.    Yes [provider]  Melatonin 3 MG TABS Take 0.5 tablets (1.5 mg total) by mouth at bedtime. Patient taking differently: Take 3 mg by mouth at bedtime.  10/24/17  Yes Love, Ivan Anchors, PA-C  METHOCARBAMOL PO Take by mouth as directed.   Yes [provider]  METOPROLOL TARTRATE  PO Take 12.5 mg by mouth 2 (two) times daily.    Yes [provider]  NYSTATIN powder  12/05/18  Yes [provider]  omeprazole (PRILOSEC) 20 MG capsule Take 20 mg by mouth daily.   Yes [provider]  ondansetron (ZOFRAN) 4 MG tablet Take 1 tablet (4 mg total) by mouth every 6 (six) hours as needed for nausea or vomiting. 10/24/17  Yes Love, Ivan Anchors, PA-C  polyethylene glycol (MIRALAX / GLYCOLAX) 17 g packet Take 17 g by mouth daily.   Yes [provider]  Sennosides-Docusate Sodium (SENNA-DOCUSATE SODIUM PO) Take by mouth as directed.   Yes [provider]  Sodium Phosphates (FLEET ENEMA RE) Place rectally as directed.   Yes [provider]  traMADol (ULTRAM) 50 MG tablet Take 50 mg by mouth every 6 (six) hours as needed.   Yes [provider]      Allergies:   Chocolate, Codeine, Diazepam, Fruit & vegetable daily [nutritional  supplements], Iohexol, Latex, Peach [prunus persica], Peanut-containing drug products, Penicillins, Strawberry extract, Sulfonamide derivatives, Rosuvastatin calcium, Sulfa antibiotics, Sulfamethoxazole, Crestor [rosuvastatin calcium], Iodine, Wheat, and Wheat bran   Social History   Tobacco Use   Smoking status: Former Smoker    Packs/day: 0.25    Years: 38.00    Pack years: 9.50    Start date: 02/15/1952    Quit date: 10/18/1993    Years since quitting: 25.8   Smokeless tobacco: Never Used  Substance Use Topics   Alcohol use: No    Alcohol/week: 0.0 standard drinks   Drug use: No     Family Hx: The patient's family history includes Arthritis in her father; Heart disease in her brother; Hyperlipidemia in her sister; Hypertension in her brother, sister, and another family member; Mental illness in her father. There is no history of Colon cancer, Breast cancer, Diabetes, Stroke, Cancer, or Lung disease.  ROS:   Please see the history of present illness.    All other systems reviewed and are negative.   Prior CV studies:   The following studies were reviewed today:  Echo 09/2017 - Left ventricle: The cavity size was normal. Wall thickness was   increased in a pattern of mild LVH. Systolic function was normal.   The estimated ejection fraction was in the range of 60% to 65%.   Wall motion was normal; there were no regional wall motion   abnormalities. - Mitral valve: There was mild regurgitation. - Left atrium: The atrium was severely dilated. - Right atrium: The atrium was mildly dilated. - Pulmonary arteries: Systolic pressure was mildly increased. PA   peak pressure: 33 mm Hg (S).  Labs/Other Tests and Data Reviewed:    EKG:  No ECG reviewed.  Recent Labs: 04/05/2019: Creatinine, Ser 0.80   Recent Lipid Panel Lab Results  Component Value Date/Time   CHOL 132 10/30/2015 09:31 AM   TRIG 99 09/14/2017 11:15 PM   TRIG 141 10/25/2006 09:06 AM   HDL 33 (L) 10/30/2015  09:31 AM   CHOLHDL 4.0 10/30/2015 09:31 AM   LDLCALC 85 10/30/2015 09:31 AM   LDLDIRECT 175.3 01/19/2008 12:16 PM    Wt Readings from Last 3 Encounters:  08/01/19 105 lb (47.6 kg)  03/09/19 110 lb 6.4 oz (50.1 kg)  12/05/18 117 lb (53.1 kg)     Objective:    Vital Signs:  BP 128/74    Pulse 72    Ht 5\' 7"  (1.702 m)    Wt 105 lb (  47.6 kg)    BMI 16.45 kg/m    VITAL SIGNS:  reviewed GEN:  no acute distress PSYCH:  normal affect  ASSESSMENT & PLAN:    1. Paroxysmal atrial fibrillation Denies palpitation or bleeding issue.  Continue current dose of beta-blocker and Eliquis.  Patient blood work showed normal kidney function.  2.  Hypertension -Blood pressure stable and well-controlled on current medication.  COVID-19 Education: The signs and symptoms of COVID-19 were discussed with the patient and how to seek care for testing (follow up with PCP or arrange E-visit).  The importance of social distancing was discussed today.  Time:   Today, I have spent 5 minutes with the patient with telehealth technology discussing the above problems.     Medication Adjustments/Labs and Tests Ordered: Current medicines are reviewed at length with the patient today.  Concerns regarding medicines are outlined above.   Tests Ordered: No orders of the defined types were placed in this encounter.   Medication Changes: No orders of the defined types were placed in this encounter.   Follow Up:  Either In Person or Virtual Visit in 6 month(s)  Signed, Leanor Kail, [place]  08/01/2019 9:48 AM    [place]

## 2019-08-01 ENCOUNTER — Encounter: Payer: Self-pay | Admitting: Physician Assistant

## 2019-08-01 ENCOUNTER — Telehealth (INDEPENDENT_AMBULATORY_CARE_PROVIDER_SITE_OTHER): Payer: Medicare Other | Admitting: Physician Assistant

## 2019-08-01 ENCOUNTER — Other Ambulatory Visit: Payer: Self-pay

## 2019-08-01 VITALS — BP 128/74 | HR 72 | Ht 67.0 in | Wt 105.0 lb

## 2019-08-01 DIAGNOSIS — I480 Paroxysmal atrial fibrillation: Secondary | ICD-10-CM

## 2019-08-01 DIAGNOSIS — I10 Essential (primary) hypertension: Secondary | ICD-10-CM

## 2019-08-01 DIAGNOSIS — G8929 Other chronic pain: Secondary | ICD-10-CM | POA: Diagnosis not present

## 2019-08-01 NOTE — Patient Instructions (Signed)
Medication Instructions:Your physician recommends that you continue on your current medications as directed. Please refer to the Current Medication list given to you today.  If you need a refill on your cardiac medications before your next appointment, please call your pharmacy.   Lab work: None   If you have labs (blood work) drawn today and your tests are completely normal, you will receive your results only by: Marland Kitchen MyChart Message (if you have MyChart) OR . A paper copy in the mail If you have any lab test that is abnormal or we need to change your treatment, we will call you to review the results.  Testing/Procedures: None   Follow-Up: At [facility], you and your health needs are our priority.  As part of our continuing mission to provide you with exceptional heart care, we have created designated Provider Care Teams.  These Care Teams include your primary Cardiologist (physician) and Advanced Practice Providers (APPs -  Physician Assistants and Nurse Practitioners) who all work together to provide you with the care you need, when you need it. You will need a follow up appointment in:  6 months.  Please call our office 2 months in advance to schedule this appointment.  You may see Dorris Carnes, MD or one of the following Advanced Practice Providers on your designated Care Team: Richardson Dopp, PA-C [place], [place] . Daune Perch, NP  Any Other Special Instructions Will Be Listed Below (If Applicable).

## 2019-08-15 DIAGNOSIS — Z1159 Encounter for screening for other viral diseases: Secondary | ICD-10-CM | POA: Diagnosis not present

## 2019-08-16 DIAGNOSIS — G4701 Insomnia due to medical condition: Secondary | ICD-10-CM | POA: Diagnosis not present

## 2019-08-16 DIAGNOSIS — F0150 Vascular dementia without behavioral disturbance: Secondary | ICD-10-CM | POA: Diagnosis not present

## 2019-08-16 DIAGNOSIS — F331 Major depressive disorder, recurrent, moderate: Secondary | ICD-10-CM | POA: Diagnosis not present

## 2019-08-16 DIAGNOSIS — F064 Anxiety disorder due to known physiological condition: Secondary | ICD-10-CM | POA: Diagnosis not present

## 2019-08-21 DIAGNOSIS — Z03818 Encounter for observation for suspected exposure to other biological agents ruled out: Secondary | ICD-10-CM | POA: Diagnosis not present

## 2019-08-24 DIAGNOSIS — I482 Chronic atrial fibrillation, unspecified: Secondary | ICD-10-CM | POA: Diagnosis not present

## 2019-08-24 DIAGNOSIS — M810 Age-related osteoporosis without current pathological fracture: Secondary | ICD-10-CM | POA: Diagnosis not present

## 2019-08-24 DIAGNOSIS — M818 Other osteoporosis without current pathological fracture: Secondary | ICD-10-CM | POA: Diagnosis not present

## 2019-08-24 DIAGNOSIS — F064 Anxiety disorder due to known physiological condition: Secondary | ICD-10-CM | POA: Diagnosis not present

## 2019-08-24 DIAGNOSIS — F0150 Vascular dementia without behavioral disturbance: Secondary | ICD-10-CM | POA: Diagnosis not present

## 2019-08-24 DIAGNOSIS — N390 Urinary tract infection, site not specified: Secondary | ICD-10-CM | POA: Diagnosis not present

## 2019-08-24 DIAGNOSIS — R54 Age-related physical debility: Secondary | ICD-10-CM | POA: Diagnosis not present

## 2019-08-24 DIAGNOSIS — G4701 Insomnia due to medical condition: Secondary | ICD-10-CM | POA: Diagnosis not present

## 2019-08-24 DIAGNOSIS — F331 Major depressive disorder, recurrent, moderate: Secondary | ICD-10-CM | POA: Diagnosis not present

## 2019-08-24 DIAGNOSIS — G8929 Other chronic pain: Secondary | ICD-10-CM | POA: Diagnosis not present

## 2019-08-24 DIAGNOSIS — I69398 Other sequelae of cerebral infarction: Secondary | ICD-10-CM | POA: Diagnosis not present

## 2019-08-24 DIAGNOSIS — I4891 Unspecified atrial fibrillation: Secondary | ICD-10-CM | POA: Diagnosis not present

## 2019-08-28 DIAGNOSIS — E559 Vitamin D deficiency, unspecified: Secondary | ICD-10-CM | POA: Diagnosis not present

## 2019-08-28 DIAGNOSIS — R6889 Other general symptoms and signs: Secondary | ICD-10-CM | POA: Diagnosis not present

## 2019-08-29 DIAGNOSIS — Z03818 Encounter for observation for suspected exposure to other biological agents ruled out: Secondary | ICD-10-CM | POA: Diagnosis not present

## 2019-09-04 DIAGNOSIS — Z1159 Encounter for screening for other viral diseases: Secondary | ICD-10-CM | POA: Diagnosis not present

## 2019-09-06 DIAGNOSIS — F331 Major depressive disorder, recurrent, moderate: Secondary | ICD-10-CM | POA: Diagnosis not present

## 2019-09-06 DIAGNOSIS — F064 Anxiety disorder due to known physiological condition: Secondary | ICD-10-CM | POA: Diagnosis not present

## 2019-09-06 DIAGNOSIS — F0150 Vascular dementia without behavioral disturbance: Secondary | ICD-10-CM | POA: Diagnosis not present

## 2019-09-11 DIAGNOSIS — I69398 Other sequelae of cerebral infarction: Secondary | ICD-10-CM | POA: Diagnosis not present

## 2019-09-11 DIAGNOSIS — M818 Other osteoporosis without current pathological fracture: Secondary | ICD-10-CM | POA: Diagnosis not present

## 2019-09-11 DIAGNOSIS — Z03818 Encounter for observation for suspected exposure to other biological agents ruled out: Secondary | ICD-10-CM | POA: Diagnosis not present

## 2019-09-11 DIAGNOSIS — R54 Age-related physical debility: Secondary | ICD-10-CM | POA: Diagnosis not present

## 2019-09-11 DIAGNOSIS — E43 Unspecified severe protein-calorie malnutrition: Secondary | ICD-10-CM | POA: Diagnosis not present

## 2019-09-11 DIAGNOSIS — I4891 Unspecified atrial fibrillation: Secondary | ICD-10-CM | POA: Diagnosis not present

## 2019-09-12 DIAGNOSIS — F331 Major depressive disorder, recurrent, moderate: Secondary | ICD-10-CM | POA: Diagnosis not present

## 2019-09-12 DIAGNOSIS — F0150 Vascular dementia without behavioral disturbance: Secondary | ICD-10-CM | POA: Diagnosis not present

## 2019-09-12 DIAGNOSIS — F064 Anxiety disorder due to known physiological condition: Secondary | ICD-10-CM | POA: Diagnosis not present

## 2019-09-12 DIAGNOSIS — G4701 Insomnia due to medical condition: Secondary | ICD-10-CM | POA: Diagnosis not present

## 2019-09-17 ENCOUNTER — Other Ambulatory Visit: Payer: Self-pay | Admitting: Radiation Oncology

## 2019-09-18 DIAGNOSIS — Z03818 Encounter for observation for suspected exposure to other biological agents ruled out: Secondary | ICD-10-CM | POA: Diagnosis not present

## 2019-09-26 DIAGNOSIS — E43 Unspecified severe protein-calorie malnutrition: Secondary | ICD-10-CM | POA: Diagnosis not present

## 2019-09-26 DIAGNOSIS — N39498 Other specified urinary incontinence: Secondary | ICD-10-CM | POA: Diagnosis not present

## 2019-09-26 DIAGNOSIS — I482 Chronic atrial fibrillation, unspecified: Secondary | ICD-10-CM | POA: Diagnosis not present

## 2019-09-26 DIAGNOSIS — I4891 Unspecified atrial fibrillation: Secondary | ICD-10-CM | POA: Diagnosis not present

## 2019-09-26 DIAGNOSIS — M810 Age-related osteoporosis without current pathological fracture: Secondary | ICD-10-CM | POA: Diagnosis not present

## 2019-09-26 DIAGNOSIS — N390 Urinary tract infection, site not specified: Secondary | ICD-10-CM | POA: Diagnosis not present

## 2019-09-26 DIAGNOSIS — M818 Other osteoporosis without current pathological fracture: Secondary | ICD-10-CM | POA: Diagnosis not present

## 2019-09-26 DIAGNOSIS — R54 Age-related physical debility: Secondary | ICD-10-CM | POA: Diagnosis not present

## 2019-09-26 DIAGNOSIS — R32 Unspecified urinary incontinence: Secondary | ICD-10-CM | POA: Diagnosis not present

## 2019-09-26 DIAGNOSIS — I69398 Other sequelae of cerebral infarction: Secondary | ICD-10-CM | POA: Diagnosis not present

## 2019-09-26 DIAGNOSIS — E785 Hyperlipidemia, unspecified: Secondary | ICD-10-CM | POA: Diagnosis not present

## 2019-09-26 DIAGNOSIS — G8929 Other chronic pain: Secondary | ICD-10-CM | POA: Diagnosis not present

## 2019-10-10 DIAGNOSIS — Z1159 Encounter for screening for other viral diseases: Secondary | ICD-10-CM | POA: Diagnosis not present

## 2019-10-15 DIAGNOSIS — Z03818 Encounter for observation for suspected exposure to other biological agents ruled out: Secondary | ICD-10-CM | POA: Diagnosis not present

## 2019-10-16 DIAGNOSIS — Z03818 Encounter for observation for suspected exposure to other biological agents ruled out: Secondary | ICD-10-CM | POA: Diagnosis not present

## 2019-10-19 DIAGNOSIS — D649 Anemia, unspecified: Secondary | ICD-10-CM | POA: Diagnosis not present

## 2019-10-19 DIAGNOSIS — G8191 Hemiplegia, unspecified affecting right dominant side: Secondary | ICD-10-CM | POA: Diagnosis not present

## 2019-10-19 DIAGNOSIS — E7800 Pure hypercholesterolemia, unspecified: Secondary | ICD-10-CM | POA: Diagnosis not present

## 2019-10-19 DIAGNOSIS — I63312 Cerebral infarction due to thrombosis of left middle cerebral artery: Secondary | ICD-10-CM | POA: Diagnosis not present

## 2019-10-19 DIAGNOSIS — I10 Essential (primary) hypertension: Secondary | ICD-10-CM | POA: Diagnosis not present

## 2019-10-19 DIAGNOSIS — I2510 Atherosclerotic heart disease of native coronary artery without angina pectoris: Secondary | ICD-10-CM | POA: Diagnosis not present

## 2019-10-19 DIAGNOSIS — E039 Hypothyroidism, unspecified: Secondary | ICD-10-CM | POA: Diagnosis not present

## 2019-10-19 DIAGNOSIS — R4189 Other symptoms and signs involving cognitive functions and awareness: Secondary | ICD-10-CM | POA: Diagnosis not present

## 2019-10-19 DIAGNOSIS — I69398 Other sequelae of cerebral infarction: Secondary | ICD-10-CM | POA: Diagnosis not present

## 2019-10-21 DIAGNOSIS — E7800 Pure hypercholesterolemia, unspecified: Secondary | ICD-10-CM | POA: Diagnosis not present

## 2019-10-21 DIAGNOSIS — I2510 Atherosclerotic heart disease of native coronary artery without angina pectoris: Secondary | ICD-10-CM | POA: Diagnosis not present

## 2019-10-21 DIAGNOSIS — B342 Coronavirus infection, unspecified: Secondary | ICD-10-CM | POA: Diagnosis not present

## 2019-10-21 DIAGNOSIS — U071 COVID-19: Secondary | ICD-10-CM | POA: Diagnosis not present

## 2019-10-21 DIAGNOSIS — D649 Anemia, unspecified: Secondary | ICD-10-CM | POA: Diagnosis not present

## 2019-10-23 DIAGNOSIS — R4189 Other symptoms and signs involving cognitive functions and awareness: Secondary | ICD-10-CM | POA: Diagnosis not present

## 2019-10-23 DIAGNOSIS — G8191 Hemiplegia, unspecified affecting right dominant side: Secondary | ICD-10-CM | POA: Diagnosis not present

## 2019-10-23 DIAGNOSIS — I69398 Other sequelae of cerebral infarction: Secondary | ICD-10-CM | POA: Diagnosis not present

## 2019-10-23 DIAGNOSIS — I63312 Cerebral infarction due to thrombosis of left middle cerebral artery: Secondary | ICD-10-CM | POA: Diagnosis not present

## 2019-10-24 DIAGNOSIS — R319 Hematuria, unspecified: Secondary | ICD-10-CM | POA: Diagnosis not present

## 2019-10-24 DIAGNOSIS — I63312 Cerebral infarction due to thrombosis of left middle cerebral artery: Secondary | ICD-10-CM | POA: Diagnosis not present

## 2019-10-24 DIAGNOSIS — R4189 Other symptoms and signs involving cognitive functions and awareness: Secondary | ICD-10-CM | POA: Diagnosis not present

## 2019-10-24 DIAGNOSIS — G8191 Hemiplegia, unspecified affecting right dominant side: Secondary | ICD-10-CM | POA: Diagnosis not present

## 2019-10-24 DIAGNOSIS — I69398 Other sequelae of cerebral infarction: Secondary | ICD-10-CM | POA: Diagnosis not present

## 2019-10-24 DIAGNOSIS — U071 COVID-19: Secondary | ICD-10-CM | POA: Diagnosis not present

## 2019-10-25 DIAGNOSIS — I63312 Cerebral infarction due to thrombosis of left middle cerebral artery: Secondary | ICD-10-CM | POA: Diagnosis not present

## 2019-10-25 DIAGNOSIS — R6889 Other general symptoms and signs: Secondary | ICD-10-CM | POA: Diagnosis not present

## 2019-10-25 DIAGNOSIS — R319 Hematuria, unspecified: Secondary | ICD-10-CM | POA: Diagnosis not present

## 2019-10-25 DIAGNOSIS — N390 Urinary tract infection, site not specified: Secondary | ICD-10-CM | POA: Diagnosis not present

## 2019-10-25 DIAGNOSIS — I69398 Other sequelae of cerebral infarction: Secondary | ICD-10-CM | POA: Diagnosis not present

## 2019-10-25 DIAGNOSIS — G8191 Hemiplegia, unspecified affecting right dominant side: Secondary | ICD-10-CM | POA: Diagnosis not present

## 2019-10-25 DIAGNOSIS — Z79899 Other long term (current) drug therapy: Secondary | ICD-10-CM | POA: Diagnosis not present

## 2019-10-25 DIAGNOSIS — D649 Anemia, unspecified: Secondary | ICD-10-CM | POA: Diagnosis not present

## 2019-10-25 DIAGNOSIS — R4189 Other symptoms and signs involving cognitive functions and awareness: Secondary | ICD-10-CM | POA: Diagnosis not present

## 2019-10-26 DIAGNOSIS — I517 Cardiomegaly: Secondary | ICD-10-CM | POA: Diagnosis not present

## 2019-10-26 DIAGNOSIS — I63312 Cerebral infarction due to thrombosis of left middle cerebral artery: Secondary | ICD-10-CM | POA: Diagnosis not present

## 2019-10-26 DIAGNOSIS — R4189 Other symptoms and signs involving cognitive functions and awareness: Secondary | ICD-10-CM | POA: Diagnosis not present

## 2019-10-26 DIAGNOSIS — I69398 Other sequelae of cerebral infarction: Secondary | ICD-10-CM | POA: Diagnosis not present

## 2019-10-26 DIAGNOSIS — G8191 Hemiplegia, unspecified affecting right dominant side: Secondary | ICD-10-CM | POA: Diagnosis not present

## 2019-10-26 DIAGNOSIS — R05 Cough: Secondary | ICD-10-CM | POA: Diagnosis not present

## 2019-10-29 DIAGNOSIS — D649 Anemia, unspecified: Secondary | ICD-10-CM | POA: Diagnosis not present

## 2019-10-29 DIAGNOSIS — I63312 Cerebral infarction due to thrombosis of left middle cerebral artery: Secondary | ICD-10-CM | POA: Diagnosis not present

## 2019-10-29 DIAGNOSIS — I10 Essential (primary) hypertension: Secondary | ICD-10-CM | POA: Diagnosis not present

## 2019-10-29 DIAGNOSIS — R4189 Other symptoms and signs involving cognitive functions and awareness: Secondary | ICD-10-CM | POA: Diagnosis not present

## 2019-10-29 DIAGNOSIS — I69398 Other sequelae of cerebral infarction: Secondary | ICD-10-CM | POA: Diagnosis not present

## 2019-10-29 DIAGNOSIS — G8191 Hemiplegia, unspecified affecting right dominant side: Secondary | ICD-10-CM | POA: Diagnosis not present

## 2019-10-30 DIAGNOSIS — I63312 Cerebral infarction due to thrombosis of left middle cerebral artery: Secondary | ICD-10-CM | POA: Diagnosis not present

## 2019-10-30 DIAGNOSIS — I69398 Other sequelae of cerebral infarction: Secondary | ICD-10-CM | POA: Diagnosis not present

## 2019-10-30 DIAGNOSIS — R4189 Other symptoms and signs involving cognitive functions and awareness: Secondary | ICD-10-CM | POA: Diagnosis not present

## 2019-10-30 DIAGNOSIS — G8191 Hemiplegia, unspecified affecting right dominant side: Secondary | ICD-10-CM | POA: Diagnosis not present

## 2019-10-31 DIAGNOSIS — I69398 Other sequelae of cerebral infarction: Secondary | ICD-10-CM | POA: Diagnosis not present

## 2019-10-31 DIAGNOSIS — I63312 Cerebral infarction due to thrombosis of left middle cerebral artery: Secondary | ICD-10-CM | POA: Diagnosis not present

## 2019-10-31 DIAGNOSIS — G8191 Hemiplegia, unspecified affecting right dominant side: Secondary | ICD-10-CM | POA: Diagnosis not present

## 2019-10-31 DIAGNOSIS — R4189 Other symptoms and signs involving cognitive functions and awareness: Secondary | ICD-10-CM | POA: Diagnosis not present

## 2019-11-01 DIAGNOSIS — R4189 Other symptoms and signs involving cognitive functions and awareness: Secondary | ICD-10-CM | POA: Diagnosis not present

## 2019-11-01 DIAGNOSIS — I63312 Cerebral infarction due to thrombosis of left middle cerebral artery: Secondary | ICD-10-CM | POA: Diagnosis not present

## 2019-11-01 DIAGNOSIS — I69398 Other sequelae of cerebral infarction: Secondary | ICD-10-CM | POA: Diagnosis not present

## 2019-11-01 DIAGNOSIS — G8191 Hemiplegia, unspecified affecting right dominant side: Secondary | ICD-10-CM | POA: Diagnosis not present

## 2019-11-02 DIAGNOSIS — U071 COVID-19: Secondary | ICD-10-CM | POA: Diagnosis not present

## 2019-11-02 DIAGNOSIS — G8191 Hemiplegia, unspecified affecting right dominant side: Secondary | ICD-10-CM | POA: Diagnosis not present

## 2019-11-02 DIAGNOSIS — I69398 Other sequelae of cerebral infarction: Secondary | ICD-10-CM | POA: Diagnosis not present

## 2019-11-02 DIAGNOSIS — I63312 Cerebral infarction due to thrombosis of left middle cerebral artery: Secondary | ICD-10-CM | POA: Diagnosis not present

## 2019-11-02 DIAGNOSIS — R4189 Other symptoms and signs involving cognitive functions and awareness: Secondary | ICD-10-CM | POA: Diagnosis not present

## 2019-11-02 DIAGNOSIS — R1312 Dysphagia, oropharyngeal phase: Secondary | ICD-10-CM | POA: Diagnosis not present

## 2019-11-06 DIAGNOSIS — R4189 Other symptoms and signs involving cognitive functions and awareness: Secondary | ICD-10-CM | POA: Diagnosis not present

## 2019-11-06 DIAGNOSIS — I69398 Other sequelae of cerebral infarction: Secondary | ICD-10-CM | POA: Diagnosis not present

## 2019-11-06 DIAGNOSIS — G8191 Hemiplegia, unspecified affecting right dominant side: Secondary | ICD-10-CM | POA: Diagnosis not present

## 2019-11-06 DIAGNOSIS — I63312 Cerebral infarction due to thrombosis of left middle cerebral artery: Secondary | ICD-10-CM | POA: Diagnosis not present

## 2019-11-07 DIAGNOSIS — F064 Anxiety disorder due to known physiological condition: Secondary | ICD-10-CM | POA: Diagnosis not present

## 2019-11-07 DIAGNOSIS — G8191 Hemiplegia, unspecified affecting right dominant side: Secondary | ICD-10-CM | POA: Diagnosis not present

## 2019-11-07 DIAGNOSIS — F331 Major depressive disorder, recurrent, moderate: Secondary | ICD-10-CM | POA: Diagnosis not present

## 2019-11-07 DIAGNOSIS — F0150 Vascular dementia without behavioral disturbance: Secondary | ICD-10-CM | POA: Diagnosis not present

## 2019-11-07 DIAGNOSIS — R4189 Other symptoms and signs involving cognitive functions and awareness: Secondary | ICD-10-CM | POA: Diagnosis not present

## 2019-11-07 DIAGNOSIS — I69398 Other sequelae of cerebral infarction: Secondary | ICD-10-CM | POA: Diagnosis not present

## 2019-11-07 DIAGNOSIS — G4701 Insomnia due to medical condition: Secondary | ICD-10-CM | POA: Diagnosis not present

## 2019-11-07 DIAGNOSIS — I63312 Cerebral infarction due to thrombosis of left middle cerebral artery: Secondary | ICD-10-CM | POA: Diagnosis not present

## 2019-11-08 DIAGNOSIS — G8191 Hemiplegia, unspecified affecting right dominant side: Secondary | ICD-10-CM | POA: Diagnosis not present

## 2019-11-08 DIAGNOSIS — I63312 Cerebral infarction due to thrombosis of left middle cerebral artery: Secondary | ICD-10-CM | POA: Diagnosis not present

## 2019-11-08 DIAGNOSIS — R4189 Other symptoms and signs involving cognitive functions and awareness: Secondary | ICD-10-CM | POA: Diagnosis not present

## 2019-11-08 DIAGNOSIS — U071 COVID-19: Secondary | ICD-10-CM | POA: Diagnosis not present

## 2019-11-08 DIAGNOSIS — I4891 Unspecified atrial fibrillation: Secondary | ICD-10-CM | POA: Diagnosis not present

## 2019-11-08 DIAGNOSIS — I69398 Other sequelae of cerebral infarction: Secondary | ICD-10-CM | POA: Diagnosis not present

## 2019-11-08 DIAGNOSIS — R54 Age-related physical debility: Secondary | ICD-10-CM | POA: Diagnosis not present

## 2019-11-08 DIAGNOSIS — M810 Age-related osteoporosis without current pathological fracture: Secondary | ICD-10-CM | POA: Diagnosis not present

## 2019-11-09 DIAGNOSIS — I69398 Other sequelae of cerebral infarction: Secondary | ICD-10-CM | POA: Diagnosis not present

## 2019-11-09 DIAGNOSIS — G8191 Hemiplegia, unspecified affecting right dominant side: Secondary | ICD-10-CM | POA: Diagnosis not present

## 2019-11-09 DIAGNOSIS — R4189 Other symptoms and signs involving cognitive functions and awareness: Secondary | ICD-10-CM | POA: Diagnosis not present

## 2019-11-09 DIAGNOSIS — I63312 Cerebral infarction due to thrombosis of left middle cerebral artery: Secondary | ICD-10-CM | POA: Diagnosis not present

## 2019-11-12 DIAGNOSIS — M6281 Muscle weakness (generalized): Secondary | ICD-10-CM | POA: Diagnosis not present

## 2019-11-12 DIAGNOSIS — R4189 Other symptoms and signs involving cognitive functions and awareness: Secondary | ICD-10-CM | POA: Diagnosis not present

## 2019-11-12 DIAGNOSIS — I69398 Other sequelae of cerebral infarction: Secondary | ICD-10-CM | POA: Diagnosis not present

## 2019-11-12 DIAGNOSIS — I63312 Cerebral infarction due to thrombosis of left middle cerebral artery: Secondary | ICD-10-CM | POA: Diagnosis not present

## 2019-11-12 DIAGNOSIS — G8191 Hemiplegia, unspecified affecting right dominant side: Secondary | ICD-10-CM | POA: Diagnosis not present

## 2019-11-13 DIAGNOSIS — I69398 Other sequelae of cerebral infarction: Secondary | ICD-10-CM | POA: Diagnosis not present

## 2019-11-13 DIAGNOSIS — R4189 Other symptoms and signs involving cognitive functions and awareness: Secondary | ICD-10-CM | POA: Diagnosis not present

## 2019-11-13 DIAGNOSIS — G8191 Hemiplegia, unspecified affecting right dominant side: Secondary | ICD-10-CM | POA: Diagnosis not present

## 2019-11-13 DIAGNOSIS — I63312 Cerebral infarction due to thrombosis of left middle cerebral artery: Secondary | ICD-10-CM | POA: Diagnosis not present

## 2019-11-14 DIAGNOSIS — I69398 Other sequelae of cerebral infarction: Secondary | ICD-10-CM | POA: Diagnosis not present

## 2019-11-14 DIAGNOSIS — R4189 Other symptoms and signs involving cognitive functions and awareness: Secondary | ICD-10-CM | POA: Diagnosis not present

## 2019-11-14 DIAGNOSIS — I63312 Cerebral infarction due to thrombosis of left middle cerebral artery: Secondary | ICD-10-CM | POA: Diagnosis not present

## 2019-11-14 DIAGNOSIS — G8191 Hemiplegia, unspecified affecting right dominant side: Secondary | ICD-10-CM | POA: Diagnosis not present

## 2019-11-15 DIAGNOSIS — G8191 Hemiplegia, unspecified affecting right dominant side: Secondary | ICD-10-CM | POA: Diagnosis not present

## 2019-11-15 DIAGNOSIS — R4189 Other symptoms and signs involving cognitive functions and awareness: Secondary | ICD-10-CM | POA: Diagnosis not present

## 2019-11-15 DIAGNOSIS — I63312 Cerebral infarction due to thrombosis of left middle cerebral artery: Secondary | ICD-10-CM | POA: Diagnosis not present

## 2019-11-15 DIAGNOSIS — I69398 Other sequelae of cerebral infarction: Secondary | ICD-10-CM | POA: Diagnosis not present

## 2019-11-16 DIAGNOSIS — I63312 Cerebral infarction due to thrombosis of left middle cerebral artery: Secondary | ICD-10-CM | POA: Diagnosis not present

## 2019-11-16 DIAGNOSIS — G8191 Hemiplegia, unspecified affecting right dominant side: Secondary | ICD-10-CM | POA: Diagnosis not present

## 2019-11-16 DIAGNOSIS — R4189 Other symptoms and signs involving cognitive functions and awareness: Secondary | ICD-10-CM | POA: Diagnosis not present

## 2019-11-16 DIAGNOSIS — I69398 Other sequelae of cerebral infarction: Secondary | ICD-10-CM | POA: Diagnosis not present

## 2019-11-19 DIAGNOSIS — I69398 Other sequelae of cerebral infarction: Secondary | ICD-10-CM | POA: Diagnosis not present

## 2019-11-19 DIAGNOSIS — R1312 Dysphagia, oropharyngeal phase: Secondary | ICD-10-CM | POA: Diagnosis not present

## 2019-11-19 DIAGNOSIS — U071 COVID-19: Secondary | ICD-10-CM | POA: Diagnosis not present

## 2019-11-19 DIAGNOSIS — R4789 Other speech disturbances: Secondary | ICD-10-CM | POA: Diagnosis not present

## 2019-11-19 DIAGNOSIS — I63312 Cerebral infarction due to thrombosis of left middle cerebral artery: Secondary | ICD-10-CM | POA: Diagnosis not present

## 2019-11-19 DIAGNOSIS — E440 Moderate protein-calorie malnutrition: Secondary | ICD-10-CM | POA: Diagnosis not present

## 2019-11-19 DIAGNOSIS — G8929 Other chronic pain: Secondary | ICD-10-CM | POA: Diagnosis not present

## 2019-11-20 DIAGNOSIS — R4789 Other speech disturbances: Secondary | ICD-10-CM | POA: Diagnosis not present

## 2019-11-20 DIAGNOSIS — R1312 Dysphagia, oropharyngeal phase: Secondary | ICD-10-CM | POA: Diagnosis not present

## 2019-11-20 DIAGNOSIS — I69398 Other sequelae of cerebral infarction: Secondary | ICD-10-CM | POA: Diagnosis not present

## 2019-11-20 DIAGNOSIS — U071 COVID-19: Secondary | ICD-10-CM | POA: Diagnosis not present

## 2019-11-20 DIAGNOSIS — I63312 Cerebral infarction due to thrombosis of left middle cerebral artery: Secondary | ICD-10-CM | POA: Diagnosis not present

## 2019-11-20 DIAGNOSIS — E440 Moderate protein-calorie malnutrition: Secondary | ICD-10-CM | POA: Diagnosis not present

## 2019-11-21 DIAGNOSIS — E440 Moderate protein-calorie malnutrition: Secondary | ICD-10-CM | POA: Diagnosis not present

## 2019-11-21 DIAGNOSIS — I63312 Cerebral infarction due to thrombosis of left middle cerebral artery: Secondary | ICD-10-CM | POA: Diagnosis not present

## 2019-11-21 DIAGNOSIS — R1312 Dysphagia, oropharyngeal phase: Secondary | ICD-10-CM | POA: Diagnosis not present

## 2019-11-21 DIAGNOSIS — I69398 Other sequelae of cerebral infarction: Secondary | ICD-10-CM | POA: Diagnosis not present

## 2019-11-21 DIAGNOSIS — U071 COVID-19: Secondary | ICD-10-CM | POA: Diagnosis not present

## 2019-11-21 DIAGNOSIS — R4789 Other speech disturbances: Secondary | ICD-10-CM | POA: Diagnosis not present

## 2019-11-22 DIAGNOSIS — I63312 Cerebral infarction due to thrombosis of left middle cerebral artery: Secondary | ICD-10-CM | POA: Diagnosis not present

## 2019-11-22 DIAGNOSIS — R1312 Dysphagia, oropharyngeal phase: Secondary | ICD-10-CM | POA: Diagnosis not present

## 2019-11-22 DIAGNOSIS — U071 COVID-19: Secondary | ICD-10-CM | POA: Diagnosis not present

## 2019-11-22 DIAGNOSIS — I69398 Other sequelae of cerebral infarction: Secondary | ICD-10-CM | POA: Diagnosis not present

## 2019-11-22 DIAGNOSIS — E440 Moderate protein-calorie malnutrition: Secondary | ICD-10-CM | POA: Diagnosis not present

## 2019-11-22 DIAGNOSIS — R4789 Other speech disturbances: Secondary | ICD-10-CM | POA: Diagnosis not present

## 2019-11-23 DIAGNOSIS — L99 Other disorders of skin and subcutaneous tissue in diseases classified elsewhere: Secondary | ICD-10-CM | POA: Diagnosis not present

## 2019-11-23 DIAGNOSIS — I69398 Other sequelae of cerebral infarction: Secondary | ICD-10-CM | POA: Diagnosis not present

## 2019-11-23 DIAGNOSIS — I63312 Cerebral infarction due to thrombosis of left middle cerebral artery: Secondary | ICD-10-CM | POA: Diagnosis not present

## 2019-11-23 DIAGNOSIS — R4789 Other speech disturbances: Secondary | ICD-10-CM | POA: Diagnosis not present

## 2019-11-23 DIAGNOSIS — E440 Moderate protein-calorie malnutrition: Secondary | ICD-10-CM | POA: Diagnosis not present

## 2019-11-23 DIAGNOSIS — U071 COVID-19: Secondary | ICD-10-CM | POA: Diagnosis not present

## 2019-11-23 DIAGNOSIS — R1312 Dysphagia, oropharyngeal phase: Secondary | ICD-10-CM | POA: Diagnosis not present

## 2019-11-26 DIAGNOSIS — I69398 Other sequelae of cerebral infarction: Secondary | ICD-10-CM | POA: Diagnosis not present

## 2019-11-26 DIAGNOSIS — R4789 Other speech disturbances: Secondary | ICD-10-CM | POA: Diagnosis not present

## 2019-11-26 DIAGNOSIS — R1312 Dysphagia, oropharyngeal phase: Secondary | ICD-10-CM | POA: Diagnosis not present

## 2019-11-26 DIAGNOSIS — I63312 Cerebral infarction due to thrombosis of left middle cerebral artery: Secondary | ICD-10-CM | POA: Diagnosis not present

## 2019-11-26 DIAGNOSIS — E440 Moderate protein-calorie malnutrition: Secondary | ICD-10-CM | POA: Diagnosis not present

## 2019-11-26 DIAGNOSIS — U071 COVID-19: Secondary | ICD-10-CM | POA: Diagnosis not present

## 2019-11-27 DIAGNOSIS — R4789 Other speech disturbances: Secondary | ICD-10-CM | POA: Diagnosis not present

## 2019-11-27 DIAGNOSIS — R1312 Dysphagia, oropharyngeal phase: Secondary | ICD-10-CM | POA: Diagnosis not present

## 2019-11-27 DIAGNOSIS — I69398 Other sequelae of cerebral infarction: Secondary | ICD-10-CM | POA: Diagnosis not present

## 2019-11-27 DIAGNOSIS — U071 COVID-19: Secondary | ICD-10-CM | POA: Diagnosis not present

## 2019-11-27 DIAGNOSIS — E440 Moderate protein-calorie malnutrition: Secondary | ICD-10-CM | POA: Diagnosis not present

## 2019-11-27 DIAGNOSIS — I63312 Cerebral infarction due to thrombosis of left middle cerebral artery: Secondary | ICD-10-CM | POA: Diagnosis not present

## 2019-11-28 DIAGNOSIS — E440 Moderate protein-calorie malnutrition: Secondary | ICD-10-CM | POA: Diagnosis not present

## 2019-11-28 DIAGNOSIS — M810 Age-related osteoporosis without current pathological fracture: Secondary | ICD-10-CM | POA: Diagnosis not present

## 2019-11-28 DIAGNOSIS — J309 Allergic rhinitis, unspecified: Secondary | ICD-10-CM | POA: Diagnosis not present

## 2019-11-28 DIAGNOSIS — I69398 Other sequelae of cerebral infarction: Secondary | ICD-10-CM | POA: Diagnosis not present

## 2019-11-28 DIAGNOSIS — F0150 Vascular dementia without behavioral disturbance: Secondary | ICD-10-CM | POA: Diagnosis not present

## 2019-11-28 DIAGNOSIS — R54 Age-related physical debility: Secondary | ICD-10-CM | POA: Diagnosis not present

## 2019-11-28 DIAGNOSIS — I4891 Unspecified atrial fibrillation: Secondary | ICD-10-CM | POA: Diagnosis not present

## 2019-11-28 DIAGNOSIS — G4701 Insomnia due to medical condition: Secondary | ICD-10-CM | POA: Diagnosis not present

## 2019-11-28 DIAGNOSIS — F331 Major depressive disorder, recurrent, moderate: Secondary | ICD-10-CM | POA: Diagnosis not present

## 2019-11-28 DIAGNOSIS — R4789 Other speech disturbances: Secondary | ICD-10-CM | POA: Diagnosis not present

## 2019-11-28 DIAGNOSIS — R1312 Dysphagia, oropharyngeal phase: Secondary | ICD-10-CM | POA: Diagnosis not present

## 2019-11-28 DIAGNOSIS — F0391 Unspecified dementia with behavioral disturbance: Secondary | ICD-10-CM | POA: Diagnosis not present

## 2019-11-28 DIAGNOSIS — I63312 Cerebral infarction due to thrombosis of left middle cerebral artery: Secondary | ICD-10-CM | POA: Diagnosis not present

## 2019-11-28 DIAGNOSIS — F064 Anxiety disorder due to known physiological condition: Secondary | ICD-10-CM | POA: Diagnosis not present

## 2019-11-28 DIAGNOSIS — G894 Chronic pain syndrome: Secondary | ICD-10-CM | POA: Diagnosis not present

## 2019-11-28 DIAGNOSIS — U071 COVID-19: Secondary | ICD-10-CM | POA: Diagnosis not present

## 2019-11-29 DIAGNOSIS — U071 COVID-19: Secondary | ICD-10-CM | POA: Diagnosis not present

## 2019-11-29 DIAGNOSIS — I63312 Cerebral infarction due to thrombosis of left middle cerebral artery: Secondary | ICD-10-CM | POA: Diagnosis not present

## 2019-11-29 DIAGNOSIS — I69398 Other sequelae of cerebral infarction: Secondary | ICD-10-CM | POA: Diagnosis not present

## 2019-11-29 DIAGNOSIS — D519 Vitamin B12 deficiency anemia, unspecified: Secondary | ICD-10-CM | POA: Diagnosis not present

## 2019-11-29 DIAGNOSIS — E440 Moderate protein-calorie malnutrition: Secondary | ICD-10-CM | POA: Diagnosis not present

## 2019-11-29 DIAGNOSIS — E039 Hypothyroidism, unspecified: Secondary | ICD-10-CM | POA: Diagnosis not present

## 2019-11-29 DIAGNOSIS — D649 Anemia, unspecified: Secondary | ICD-10-CM | POA: Diagnosis not present

## 2019-11-29 DIAGNOSIS — R1312 Dysphagia, oropharyngeal phase: Secondary | ICD-10-CM | POA: Diagnosis not present

## 2019-11-29 DIAGNOSIS — R4789 Other speech disturbances: Secondary | ICD-10-CM | POA: Diagnosis not present

## 2019-11-30 DIAGNOSIS — U071 COVID-19: Secondary | ICD-10-CM | POA: Diagnosis not present

## 2019-11-30 DIAGNOSIS — E440 Moderate protein-calorie malnutrition: Secondary | ICD-10-CM | POA: Diagnosis not present

## 2019-11-30 DIAGNOSIS — I69398 Other sequelae of cerebral infarction: Secondary | ICD-10-CM | POA: Diagnosis not present

## 2019-11-30 DIAGNOSIS — R4789 Other speech disturbances: Secondary | ICD-10-CM | POA: Diagnosis not present

## 2019-11-30 DIAGNOSIS — R1312 Dysphagia, oropharyngeal phase: Secondary | ICD-10-CM | POA: Diagnosis not present

## 2019-11-30 DIAGNOSIS — I63312 Cerebral infarction due to thrombosis of left middle cerebral artery: Secondary | ICD-10-CM | POA: Diagnosis not present

## 2019-12-03 DIAGNOSIS — E440 Moderate protein-calorie malnutrition: Secondary | ICD-10-CM | POA: Diagnosis not present

## 2019-12-03 DIAGNOSIS — R4789 Other speech disturbances: Secondary | ICD-10-CM | POA: Diagnosis not present

## 2019-12-03 DIAGNOSIS — R1312 Dysphagia, oropharyngeal phase: Secondary | ICD-10-CM | POA: Diagnosis not present

## 2019-12-03 DIAGNOSIS — I69398 Other sequelae of cerebral infarction: Secondary | ICD-10-CM | POA: Diagnosis not present

## 2019-12-03 DIAGNOSIS — I63312 Cerebral infarction due to thrombosis of left middle cerebral artery: Secondary | ICD-10-CM | POA: Diagnosis not present

## 2019-12-03 DIAGNOSIS — U071 COVID-19: Secondary | ICD-10-CM | POA: Diagnosis not present

## 2019-12-04 DIAGNOSIS — R1312 Dysphagia, oropharyngeal phase: Secondary | ICD-10-CM | POA: Diagnosis not present

## 2019-12-04 DIAGNOSIS — I63312 Cerebral infarction due to thrombosis of left middle cerebral artery: Secondary | ICD-10-CM | POA: Diagnosis not present

## 2019-12-04 DIAGNOSIS — R4789 Other speech disturbances: Secondary | ICD-10-CM | POA: Diagnosis not present

## 2019-12-04 DIAGNOSIS — U071 COVID-19: Secondary | ICD-10-CM | POA: Diagnosis not present

## 2019-12-04 DIAGNOSIS — N390 Urinary tract infection, site not specified: Secondary | ICD-10-CM | POA: Diagnosis not present

## 2019-12-04 DIAGNOSIS — E785 Hyperlipidemia, unspecified: Secondary | ICD-10-CM | POA: Diagnosis not present

## 2019-12-04 DIAGNOSIS — E440 Moderate protein-calorie malnutrition: Secondary | ICD-10-CM | POA: Diagnosis not present

## 2019-12-04 DIAGNOSIS — R319 Hematuria, unspecified: Secondary | ICD-10-CM | POA: Diagnosis not present

## 2019-12-04 DIAGNOSIS — I69398 Other sequelae of cerebral infarction: Secondary | ICD-10-CM | POA: Diagnosis not present

## 2019-12-05 DIAGNOSIS — F331 Major depressive disorder, recurrent, moderate: Secondary | ICD-10-CM | POA: Diagnosis not present

## 2019-12-05 DIAGNOSIS — U071 COVID-19: Secondary | ICD-10-CM | POA: Diagnosis not present

## 2019-12-05 DIAGNOSIS — R4789 Other speech disturbances: Secondary | ICD-10-CM | POA: Diagnosis not present

## 2019-12-05 DIAGNOSIS — G4701 Insomnia due to medical condition: Secondary | ICD-10-CM | POA: Diagnosis not present

## 2019-12-05 DIAGNOSIS — I63312 Cerebral infarction due to thrombosis of left middle cerebral artery: Secondary | ICD-10-CM | POA: Diagnosis not present

## 2019-12-05 DIAGNOSIS — R1312 Dysphagia, oropharyngeal phase: Secondary | ICD-10-CM | POA: Diagnosis not present

## 2019-12-05 DIAGNOSIS — I69398 Other sequelae of cerebral infarction: Secondary | ICD-10-CM | POA: Diagnosis not present

## 2019-12-05 DIAGNOSIS — F0150 Vascular dementia without behavioral disturbance: Secondary | ICD-10-CM | POA: Diagnosis not present

## 2019-12-05 DIAGNOSIS — E440 Moderate protein-calorie malnutrition: Secondary | ICD-10-CM | POA: Diagnosis not present

## 2019-12-05 DIAGNOSIS — F064 Anxiety disorder due to known physiological condition: Secondary | ICD-10-CM | POA: Diagnosis not present

## 2019-12-06 DIAGNOSIS — U071 COVID-19: Secondary | ICD-10-CM | POA: Diagnosis not present

## 2019-12-06 DIAGNOSIS — I63312 Cerebral infarction due to thrombosis of left middle cerebral artery: Secondary | ICD-10-CM | POA: Diagnosis not present

## 2019-12-06 DIAGNOSIS — E440 Moderate protein-calorie malnutrition: Secondary | ICD-10-CM | POA: Diagnosis not present

## 2019-12-06 DIAGNOSIS — R1312 Dysphagia, oropharyngeal phase: Secondary | ICD-10-CM | POA: Diagnosis not present

## 2019-12-06 DIAGNOSIS — I69398 Other sequelae of cerebral infarction: Secondary | ICD-10-CM | POA: Diagnosis not present

## 2019-12-06 DIAGNOSIS — R4789 Other speech disturbances: Secondary | ICD-10-CM | POA: Diagnosis not present

## 2019-12-07 DIAGNOSIS — M62838 Other muscle spasm: Secondary | ICD-10-CM | POA: Diagnosis not present

## 2019-12-07 DIAGNOSIS — I63312 Cerebral infarction due to thrombosis of left middle cerebral artery: Secondary | ICD-10-CM | POA: Diagnosis not present

## 2019-12-07 DIAGNOSIS — E440 Moderate protein-calorie malnutrition: Secondary | ICD-10-CM | POA: Diagnosis not present

## 2019-12-07 DIAGNOSIS — R4789 Other speech disturbances: Secondary | ICD-10-CM | POA: Diagnosis not present

## 2019-12-07 DIAGNOSIS — R1312 Dysphagia, oropharyngeal phase: Secondary | ICD-10-CM | POA: Diagnosis not present

## 2019-12-07 DIAGNOSIS — R2689 Other abnormalities of gait and mobility: Secondary | ICD-10-CM | POA: Diagnosis not present

## 2019-12-07 DIAGNOSIS — I69398 Other sequelae of cerebral infarction: Secondary | ICD-10-CM | POA: Diagnosis not present

## 2019-12-07 DIAGNOSIS — U071 COVID-19: Secondary | ICD-10-CM | POA: Diagnosis not present

## 2019-12-07 DIAGNOSIS — E43 Unspecified severe protein-calorie malnutrition: Secondary | ICD-10-CM | POA: Diagnosis not present

## 2019-12-11 DIAGNOSIS — I63312 Cerebral infarction due to thrombosis of left middle cerebral artery: Secondary | ICD-10-CM | POA: Diagnosis not present

## 2019-12-11 DIAGNOSIS — R4789 Other speech disturbances: Secondary | ICD-10-CM | POA: Diagnosis not present

## 2019-12-11 DIAGNOSIS — E440 Moderate protein-calorie malnutrition: Secondary | ICD-10-CM | POA: Diagnosis not present

## 2019-12-11 DIAGNOSIS — I69398 Other sequelae of cerebral infarction: Secondary | ICD-10-CM | POA: Diagnosis not present

## 2019-12-11 DIAGNOSIS — U071 COVID-19: Secondary | ICD-10-CM | POA: Diagnosis not present

## 2019-12-11 DIAGNOSIS — R1312 Dysphagia, oropharyngeal phase: Secondary | ICD-10-CM | POA: Diagnosis not present

## 2019-12-14 DIAGNOSIS — I69398 Other sequelae of cerebral infarction: Secondary | ICD-10-CM | POA: Diagnosis not present

## 2019-12-14 DIAGNOSIS — R1312 Dysphagia, oropharyngeal phase: Secondary | ICD-10-CM | POA: Diagnosis not present

## 2019-12-14 DIAGNOSIS — E440 Moderate protein-calorie malnutrition: Secondary | ICD-10-CM | POA: Diagnosis not present

## 2019-12-14 DIAGNOSIS — R4789 Other speech disturbances: Secondary | ICD-10-CM | POA: Diagnosis not present

## 2019-12-14 DIAGNOSIS — I63312 Cerebral infarction due to thrombosis of left middle cerebral artery: Secondary | ICD-10-CM | POA: Diagnosis not present

## 2019-12-14 DIAGNOSIS — U071 COVID-19: Secondary | ICD-10-CM | POA: Diagnosis not present

## 2019-12-17 DIAGNOSIS — I69398 Other sequelae of cerebral infarction: Secondary | ICD-10-CM | POA: Diagnosis not present

## 2019-12-17 DIAGNOSIS — E440 Moderate protein-calorie malnutrition: Secondary | ICD-10-CM | POA: Diagnosis not present

## 2019-12-17 DIAGNOSIS — F0391 Unspecified dementia with behavioral disturbance: Secondary | ICD-10-CM | POA: Diagnosis not present

## 2019-12-17 DIAGNOSIS — M6281 Muscle weakness (generalized): Secondary | ICD-10-CM | POA: Diagnosis not present

## 2019-12-17 DIAGNOSIS — F0150 Vascular dementia without behavioral disturbance: Secondary | ICD-10-CM | POA: Diagnosis not present

## 2019-12-17 DIAGNOSIS — F064 Anxiety disorder due to known physiological condition: Secondary | ICD-10-CM | POA: Diagnosis not present

## 2019-12-17 DIAGNOSIS — R4189 Other symptoms and signs involving cognitive functions and awareness: Secondary | ICD-10-CM | POA: Diagnosis not present

## 2019-12-17 DIAGNOSIS — G8191 Hemiplegia, unspecified affecting right dominant side: Secondary | ICD-10-CM | POA: Diagnosis not present

## 2019-12-17 DIAGNOSIS — R54 Age-related physical debility: Secondary | ICD-10-CM | POA: Diagnosis not present

## 2019-12-17 DIAGNOSIS — K5900 Constipation, unspecified: Secondary | ICD-10-CM | POA: Diagnosis not present

## 2019-12-17 DIAGNOSIS — U071 COVID-19: Secondary | ICD-10-CM | POA: Diagnosis not present

## 2019-12-17 DIAGNOSIS — R2689 Other abnormalities of gait and mobility: Secondary | ICD-10-CM | POA: Diagnosis not present

## 2019-12-17 DIAGNOSIS — M810 Age-related osteoporosis without current pathological fracture: Secondary | ICD-10-CM | POA: Diagnosis not present

## 2019-12-17 DIAGNOSIS — R4789 Other speech disturbances: Secondary | ICD-10-CM | POA: Diagnosis not present

## 2019-12-17 DIAGNOSIS — I63312 Cerebral infarction due to thrombosis of left middle cerebral artery: Secondary | ICD-10-CM | POA: Diagnosis not present

## 2019-12-17 DIAGNOSIS — G894 Chronic pain syndrome: Secondary | ICD-10-CM | POA: Diagnosis not present

## 2019-12-17 DIAGNOSIS — R1312 Dysphagia, oropharyngeal phase: Secondary | ICD-10-CM | POA: Diagnosis not present

## 2019-12-17 DIAGNOSIS — I4891 Unspecified atrial fibrillation: Secondary | ICD-10-CM | POA: Diagnosis not present

## 2019-12-17 DIAGNOSIS — G4701 Insomnia due to medical condition: Secondary | ICD-10-CM | POA: Diagnosis not present

## 2019-12-19 DIAGNOSIS — I63312 Cerebral infarction due to thrombosis of left middle cerebral artery: Secondary | ICD-10-CM | POA: Diagnosis not present

## 2019-12-19 DIAGNOSIS — R1312 Dysphagia, oropharyngeal phase: Secondary | ICD-10-CM | POA: Diagnosis not present

## 2019-12-19 DIAGNOSIS — I69398 Other sequelae of cerebral infarction: Secondary | ICD-10-CM | POA: Diagnosis not present

## 2019-12-19 DIAGNOSIS — R4789 Other speech disturbances: Secondary | ICD-10-CM | POA: Diagnosis not present

## 2019-12-19 DIAGNOSIS — R4189 Other symptoms and signs involving cognitive functions and awareness: Secondary | ICD-10-CM | POA: Diagnosis not present

## 2019-12-19 DIAGNOSIS — G8191 Hemiplegia, unspecified affecting right dominant side: Secondary | ICD-10-CM | POA: Diagnosis not present

## 2019-12-21 DIAGNOSIS — R4789 Other speech disturbances: Secondary | ICD-10-CM | POA: Diagnosis not present

## 2019-12-21 DIAGNOSIS — R1312 Dysphagia, oropharyngeal phase: Secondary | ICD-10-CM | POA: Diagnosis not present

## 2019-12-21 DIAGNOSIS — I63312 Cerebral infarction due to thrombosis of left middle cerebral artery: Secondary | ICD-10-CM | POA: Diagnosis not present

## 2019-12-21 DIAGNOSIS — G8191 Hemiplegia, unspecified affecting right dominant side: Secondary | ICD-10-CM | POA: Diagnosis not present

## 2019-12-21 DIAGNOSIS — R4189 Other symptoms and signs involving cognitive functions and awareness: Secondary | ICD-10-CM | POA: Diagnosis not present

## 2019-12-21 DIAGNOSIS — I69398 Other sequelae of cerebral infarction: Secondary | ICD-10-CM | POA: Diagnosis not present

## 2019-12-25 DIAGNOSIS — R4189 Other symptoms and signs involving cognitive functions and awareness: Secondary | ICD-10-CM | POA: Diagnosis not present

## 2019-12-25 DIAGNOSIS — G8191 Hemiplegia, unspecified affecting right dominant side: Secondary | ICD-10-CM | POA: Diagnosis not present

## 2019-12-25 DIAGNOSIS — I69398 Other sequelae of cerebral infarction: Secondary | ICD-10-CM | POA: Diagnosis not present

## 2019-12-25 DIAGNOSIS — R4789 Other speech disturbances: Secondary | ICD-10-CM | POA: Diagnosis not present

## 2019-12-25 DIAGNOSIS — I63312 Cerebral infarction due to thrombosis of left middle cerebral artery: Secondary | ICD-10-CM | POA: Diagnosis not present

## 2019-12-25 DIAGNOSIS — R1312 Dysphagia, oropharyngeal phase: Secondary | ICD-10-CM | POA: Diagnosis not present

## 2019-12-26 DIAGNOSIS — R4789 Other speech disturbances: Secondary | ICD-10-CM | POA: Diagnosis not present

## 2019-12-26 DIAGNOSIS — G8191 Hemiplegia, unspecified affecting right dominant side: Secondary | ICD-10-CM | POA: Diagnosis not present

## 2019-12-26 DIAGNOSIS — I63312 Cerebral infarction due to thrombosis of left middle cerebral artery: Secondary | ICD-10-CM | POA: Diagnosis not present

## 2019-12-26 DIAGNOSIS — I69398 Other sequelae of cerebral infarction: Secondary | ICD-10-CM | POA: Diagnosis not present

## 2019-12-26 DIAGNOSIS — R4189 Other symptoms and signs involving cognitive functions and awareness: Secondary | ICD-10-CM | POA: Diagnosis not present

## 2019-12-26 DIAGNOSIS — R1312 Dysphagia, oropharyngeal phase: Secondary | ICD-10-CM | POA: Diagnosis not present

## 2019-12-31 DIAGNOSIS — R112 Nausea with vomiting, unspecified: Secondary | ICD-10-CM | POA: Diagnosis not present

## 2020-01-01 DIAGNOSIS — I69398 Other sequelae of cerebral infarction: Secondary | ICD-10-CM | POA: Diagnosis not present

## 2020-01-01 DIAGNOSIS — I63312 Cerebral infarction due to thrombosis of left middle cerebral artery: Secondary | ICD-10-CM | POA: Diagnosis not present

## 2020-01-01 DIAGNOSIS — R1312 Dysphagia, oropharyngeal phase: Secondary | ICD-10-CM | POA: Diagnosis not present

## 2020-01-01 DIAGNOSIS — G8191 Hemiplegia, unspecified affecting right dominant side: Secondary | ICD-10-CM | POA: Diagnosis not present

## 2020-01-01 DIAGNOSIS — R4789 Other speech disturbances: Secondary | ICD-10-CM | POA: Diagnosis not present

## 2020-01-01 DIAGNOSIS — R4189 Other symptoms and signs involving cognitive functions and awareness: Secondary | ICD-10-CM | POA: Diagnosis not present

## 2020-01-02 DIAGNOSIS — G4701 Insomnia due to medical condition: Secondary | ICD-10-CM | POA: Diagnosis not present

## 2020-01-02 DIAGNOSIS — F064 Anxiety disorder due to known physiological condition: Secondary | ICD-10-CM | POA: Diagnosis not present

## 2020-01-02 DIAGNOSIS — F0150 Vascular dementia without behavioral disturbance: Secondary | ICD-10-CM | POA: Diagnosis not present

## 2020-01-02 DIAGNOSIS — F331 Major depressive disorder, recurrent, moderate: Secondary | ICD-10-CM | POA: Diagnosis not present

## 2020-01-03 DIAGNOSIS — E440 Moderate protein-calorie malnutrition: Secondary | ICD-10-CM | POA: Diagnosis not present

## 2020-01-03 DIAGNOSIS — R54 Age-related physical debility: Secondary | ICD-10-CM | POA: Diagnosis not present

## 2020-01-03 DIAGNOSIS — R32 Unspecified urinary incontinence: Secondary | ICD-10-CM | POA: Diagnosis not present

## 2020-01-03 DIAGNOSIS — I639 Cerebral infarction, unspecified: Secondary | ICD-10-CM | POA: Diagnosis not present

## 2020-01-03 DIAGNOSIS — I4891 Unspecified atrial fibrillation: Secondary | ICD-10-CM | POA: Diagnosis not present

## 2020-01-03 DIAGNOSIS — I69398 Other sequelae of cerebral infarction: Secondary | ICD-10-CM | POA: Diagnosis not present

## 2020-01-04 DIAGNOSIS — R54 Age-related physical debility: Secondary | ICD-10-CM | POA: Diagnosis not present

## 2020-01-04 DIAGNOSIS — F064 Anxiety disorder due to known physiological condition: Secondary | ICD-10-CM | POA: Diagnosis not present

## 2020-01-04 DIAGNOSIS — F331 Major depressive disorder, recurrent, moderate: Secondary | ICD-10-CM | POA: Diagnosis not present

## 2020-01-23 DIAGNOSIS — R32 Unspecified urinary incontinence: Secondary | ICD-10-CM | POA: Diagnosis not present

## 2020-01-23 DIAGNOSIS — M6281 Muscle weakness (generalized): Secondary | ICD-10-CM | POA: Diagnosis not present

## 2020-01-23 DIAGNOSIS — I4891 Unspecified atrial fibrillation: Secondary | ICD-10-CM | POA: Diagnosis not present

## 2020-01-23 DIAGNOSIS — G894 Chronic pain syndrome: Secondary | ICD-10-CM | POA: Diagnosis not present

## 2020-01-23 DIAGNOSIS — F064 Anxiety disorder due to known physiological condition: Secondary | ICD-10-CM | POA: Diagnosis not present

## 2020-01-23 DIAGNOSIS — M810 Age-related osteoporosis without current pathological fracture: Secondary | ICD-10-CM | POA: Diagnosis not present

## 2020-01-23 DIAGNOSIS — I482 Chronic atrial fibrillation, unspecified: Secondary | ICD-10-CM | POA: Diagnosis not present

## 2020-01-23 DIAGNOSIS — R2689 Other abnormalities of gait and mobility: Secondary | ICD-10-CM | POA: Diagnosis not present

## 2020-01-23 DIAGNOSIS — K5900 Constipation, unspecified: Secondary | ICD-10-CM | POA: Diagnosis not present

## 2020-01-23 DIAGNOSIS — I69398 Other sequelae of cerebral infarction: Secondary | ICD-10-CM | POA: Diagnosis not present

## 2020-01-23 DIAGNOSIS — G4701 Insomnia due to medical condition: Secondary | ICD-10-CM | POA: Diagnosis not present

## 2020-01-23 DIAGNOSIS — F0150 Vascular dementia without behavioral disturbance: Secondary | ICD-10-CM | POA: Diagnosis not present

## 2020-01-29 DIAGNOSIS — F064 Anxiety disorder due to known physiological condition: Secondary | ICD-10-CM | POA: Diagnosis not present

## 2020-01-29 DIAGNOSIS — F0150 Vascular dementia without behavioral disturbance: Secondary | ICD-10-CM | POA: Diagnosis not present

## 2020-01-29 DIAGNOSIS — F331 Major depressive disorder, recurrent, moderate: Secondary | ICD-10-CM | POA: Diagnosis not present

## 2020-02-04 DIAGNOSIS — G894 Chronic pain syndrome: Secondary | ICD-10-CM | POA: Diagnosis not present

## 2020-02-11 DIAGNOSIS — G894 Chronic pain syndrome: Secondary | ICD-10-CM | POA: Diagnosis not present

## 2020-02-13 DIAGNOSIS — F331 Major depressive disorder, recurrent, moderate: Secondary | ICD-10-CM | POA: Diagnosis not present

## 2020-02-13 DIAGNOSIS — F0150 Vascular dementia without behavioral disturbance: Secondary | ICD-10-CM | POA: Diagnosis not present

## 2020-02-13 DIAGNOSIS — F064 Anxiety disorder due to known physiological condition: Secondary | ICD-10-CM | POA: Diagnosis not present

## 2020-02-15 DIAGNOSIS — I639 Cerebral infarction, unspecified: Secondary | ICD-10-CM | POA: Diagnosis not present

## 2020-02-15 DIAGNOSIS — E440 Moderate protein-calorie malnutrition: Secondary | ICD-10-CM | POA: Diagnosis not present

## 2020-02-20 DIAGNOSIS — M6281 Muscle weakness (generalized): Secondary | ICD-10-CM | POA: Diagnosis not present

## 2020-02-20 DIAGNOSIS — J449 Chronic obstructive pulmonary disease, unspecified: Secondary | ICD-10-CM | POA: Diagnosis not present

## 2020-02-20 DIAGNOSIS — G8929 Other chronic pain: Secondary | ICD-10-CM | POA: Diagnosis not present

## 2020-02-20 DIAGNOSIS — K219 Gastro-esophageal reflux disease without esophagitis: Secondary | ICD-10-CM | POA: Diagnosis not present

## 2020-02-20 DIAGNOSIS — F0150 Vascular dementia without behavioral disturbance: Secondary | ICD-10-CM | POA: Diagnosis not present

## 2020-02-20 DIAGNOSIS — R32 Unspecified urinary incontinence: Secondary | ICD-10-CM | POA: Diagnosis not present

## 2020-02-20 DIAGNOSIS — K5900 Constipation, unspecified: Secondary | ICD-10-CM | POA: Diagnosis not present

## 2020-02-20 DIAGNOSIS — J309 Allergic rhinitis, unspecified: Secondary | ICD-10-CM | POA: Diagnosis not present

## 2020-02-20 DIAGNOSIS — I4891 Unspecified atrial fibrillation: Secondary | ICD-10-CM | POA: Diagnosis not present

## 2020-02-20 DIAGNOSIS — I69398 Other sequelae of cerebral infarction: Secondary | ICD-10-CM | POA: Diagnosis not present

## 2020-02-20 DIAGNOSIS — H04129 Dry eye syndrome of unspecified lacrimal gland: Secondary | ICD-10-CM | POA: Diagnosis not present

## 2020-02-20 DIAGNOSIS — F339 Major depressive disorder, recurrent, unspecified: Secondary | ICD-10-CM | POA: Diagnosis not present

## 2020-02-27 DIAGNOSIS — F0150 Vascular dementia without behavioral disturbance: Secondary | ICD-10-CM | POA: Diagnosis not present

## 2020-02-27 DIAGNOSIS — F064 Anxiety disorder due to known physiological condition: Secondary | ICD-10-CM | POA: Diagnosis not present

## 2020-02-27 DIAGNOSIS — F331 Major depressive disorder, recurrent, moderate: Secondary | ICD-10-CM | POA: Diagnosis not present

## 2020-02-27 DIAGNOSIS — G4701 Insomnia due to medical condition: Secondary | ICD-10-CM | POA: Diagnosis not present

## 2020-02-29 DIAGNOSIS — G8929 Other chronic pain: Secondary | ICD-10-CM | POA: Diagnosis not present

## 2020-02-29 DIAGNOSIS — I69398 Other sequelae of cerebral infarction: Secondary | ICD-10-CM | POA: Diagnosis not present

## 2020-02-29 DIAGNOSIS — K219 Gastro-esophageal reflux disease without esophagitis: Secondary | ICD-10-CM | POA: Diagnosis not present

## 2020-02-29 DIAGNOSIS — M6281 Muscle weakness (generalized): Secondary | ICD-10-CM | POA: Diagnosis not present

## 2020-02-29 DIAGNOSIS — R32 Unspecified urinary incontinence: Secondary | ICD-10-CM | POA: Diagnosis not present

## 2020-02-29 DIAGNOSIS — I4891 Unspecified atrial fibrillation: Secondary | ICD-10-CM | POA: Diagnosis not present

## 2020-02-29 DIAGNOSIS — K5900 Constipation, unspecified: Secondary | ICD-10-CM | POA: Diagnosis not present

## 2020-02-29 DIAGNOSIS — J449 Chronic obstructive pulmonary disease, unspecified: Secondary | ICD-10-CM | POA: Diagnosis not present

## 2020-03-05 DIAGNOSIS — G8929 Other chronic pain: Secondary | ICD-10-CM | POA: Diagnosis not present

## 2020-03-06 NOTE — Telephone Encounter (Signed)
Left message at [facility] [phone]) to call back so that virtual appointment can be arranged for June 28 at 10:00 am with Dr. Harrington Challenger.

## 2020-03-10 DIAGNOSIS — F339 Major depressive disorder, recurrent, unspecified: Secondary | ICD-10-CM | POA: Diagnosis not present

## 2020-03-10 DIAGNOSIS — M6281 Muscle weakness (generalized): Secondary | ICD-10-CM | POA: Diagnosis not present

## 2020-03-11 NOTE — Telephone Encounter (Signed)
Left message at [place] line for a call back to confirm 04/14/20 at 10:00 with Dr. Harrington Challenger will work for a virtual visit.

## 2020-03-19 DIAGNOSIS — R32 Unspecified urinary incontinence: Secondary | ICD-10-CM | POA: Diagnosis not present

## 2020-03-19 DIAGNOSIS — M6281 Muscle weakness (generalized): Secondary | ICD-10-CM | POA: Diagnosis not present

## 2020-03-19 DIAGNOSIS — I4891 Unspecified atrial fibrillation: Secondary | ICD-10-CM | POA: Diagnosis not present

## 2020-03-19 DIAGNOSIS — F339 Major depressive disorder, recurrent, unspecified: Secondary | ICD-10-CM | POA: Diagnosis not present

## 2020-03-19 DIAGNOSIS — F0150 Vascular dementia without behavioral disturbance: Secondary | ICD-10-CM | POA: Diagnosis not present

## 2020-03-19 DIAGNOSIS — I69398 Other sequelae of cerebral infarction: Secondary | ICD-10-CM | POA: Diagnosis not present

## 2020-03-19 DIAGNOSIS — H04129 Dry eye syndrome of unspecified lacrimal gland: Secondary | ICD-10-CM | POA: Diagnosis not present

## 2020-03-19 DIAGNOSIS — K5900 Constipation, unspecified: Secondary | ICD-10-CM | POA: Diagnosis not present

## 2020-03-19 DIAGNOSIS — G8929 Other chronic pain: Secondary | ICD-10-CM | POA: Diagnosis not present

## 2020-03-19 DIAGNOSIS — K219 Gastro-esophageal reflux disease without esophagitis: Secondary | ICD-10-CM | POA: Diagnosis not present

## 2020-03-19 DIAGNOSIS — J309 Allergic rhinitis, unspecified: Secondary | ICD-10-CM | POA: Diagnosis not present

## 2020-03-19 DIAGNOSIS — J449 Chronic obstructive pulmonary disease, unspecified: Secondary | ICD-10-CM | POA: Diagnosis not present

## 2020-04-14 ENCOUNTER — Telehealth (INDEPENDENT_AMBULATORY_CARE_PROVIDER_SITE_OTHER): Payer: Medicare Other | Admitting: Internal Medicine

## 2020-04-14 ENCOUNTER — Encounter: Payer: Self-pay | Admitting: Internal Medicine

## 2020-04-14 ENCOUNTER — Other Ambulatory Visit: Payer: Self-pay

## 2020-04-14 DIAGNOSIS — I10 Essential (primary) hypertension: Secondary | ICD-10-CM

## 2020-04-14 NOTE — Progress Notes (Signed)
Patient refused appt

## 2020-04-16 ENCOUNTER — Telehealth: Payer: Self-pay | Admitting: Internal Medicine

## 2020-04-16 NOTE — Telephone Encounter (Signed)
[place] to nursing supervisor   She say patient is refusing a lot    May improve I told her I would be available if needed   If develops symptoms  2  Left VM for daughter (Ms Hollice Espy)  For above     Will not schedule for now

## 2020-05-02 DIAGNOSIS — K219 Gastro-esophageal reflux disease without esophagitis: Secondary | ICD-10-CM | POA: Diagnosis not present

## 2020-05-02 DIAGNOSIS — I4891 Unspecified atrial fibrillation: Secondary | ICD-10-CM | POA: Diagnosis not present

## 2020-05-02 DIAGNOSIS — R1312 Dysphagia, oropharyngeal phase: Secondary | ICD-10-CM | POA: Diagnosis not present

## 2020-05-02 DIAGNOSIS — I69398 Other sequelae of cerebral infarction: Secondary | ICD-10-CM | POA: Diagnosis not present

## 2020-05-02 DIAGNOSIS — J449 Chronic obstructive pulmonary disease, unspecified: Secondary | ICD-10-CM | POA: Diagnosis not present

## 2020-05-02 DIAGNOSIS — R54 Age-related physical debility: Secondary | ICD-10-CM | POA: Diagnosis not present

## 2020-05-07 DIAGNOSIS — F064 Anxiety disorder due to known physiological condition: Secondary | ICD-10-CM | POA: Diagnosis not present

## 2020-05-07 DIAGNOSIS — F331 Major depressive disorder, recurrent, moderate: Secondary | ICD-10-CM | POA: Diagnosis not present

## 2020-05-07 DIAGNOSIS — G4701 Insomnia due to medical condition: Secondary | ICD-10-CM | POA: Diagnosis not present

## 2020-05-07 DIAGNOSIS — F0151 Vascular dementia with behavioral disturbance: Secondary | ICD-10-CM | POA: Diagnosis not present

## 2020-05-14 DIAGNOSIS — G8929 Other chronic pain: Secondary | ICD-10-CM | POA: Diagnosis not present

## 2020-05-21 DIAGNOSIS — M24541 Contracture, right hand: Secondary | ICD-10-CM | POA: Diagnosis not present

## 2020-05-21 DIAGNOSIS — I69398 Other sequelae of cerebral infarction: Secondary | ICD-10-CM | POA: Diagnosis not present

## 2020-05-21 DIAGNOSIS — I4891 Unspecified atrial fibrillation: Secondary | ICD-10-CM | POA: Diagnosis not present

## 2020-05-21 DIAGNOSIS — R54 Age-related physical debility: Secondary | ICD-10-CM | POA: Diagnosis not present

## 2020-05-21 DIAGNOSIS — G894 Chronic pain syndrome: Secondary | ICD-10-CM | POA: Diagnosis not present

## 2020-05-21 DIAGNOSIS — K5900 Constipation, unspecified: Secondary | ICD-10-CM | POA: Diagnosis not present

## 2020-05-21 DIAGNOSIS — R2689 Other abnormalities of gait and mobility: Secondary | ICD-10-CM | POA: Diagnosis not present

## 2020-05-21 DIAGNOSIS — H04129 Dry eye syndrome of unspecified lacrimal gland: Secondary | ICD-10-CM | POA: Diagnosis not present

## 2020-05-21 DIAGNOSIS — R32 Unspecified urinary incontinence: Secondary | ICD-10-CM | POA: Diagnosis not present

## 2020-05-21 DIAGNOSIS — J449 Chronic obstructive pulmonary disease, unspecified: Secondary | ICD-10-CM | POA: Diagnosis not present

## 2020-05-21 DIAGNOSIS — R1312 Dysphagia, oropharyngeal phase: Secondary | ICD-10-CM | POA: Diagnosis not present

## 2020-05-21 DIAGNOSIS — K219 Gastro-esophageal reflux disease without esophagitis: Secondary | ICD-10-CM | POA: Diagnosis not present

## 2020-06-03 ENCOUNTER — Telehealth: Payer: Self-pay | Admitting: *Deleted

## 2020-06-03 ENCOUNTER — Other Ambulatory Visit: Payer: Self-pay | Admitting: Radiation Oncology

## 2020-06-03 NOTE — Telephone Encounter (Signed)
Called patient's relative [name] to ask about doing a CT, patient's relative [name] informed me that the patient is now in a skilled nursing center and she is not interested in doing a scan, informed PA Shona Simpson (via in-basket)

## 2020-06-04 DIAGNOSIS — F0151 Vascular dementia with behavioral disturbance: Secondary | ICD-10-CM | POA: Diagnosis not present

## 2020-06-04 DIAGNOSIS — F331 Major depressive disorder, recurrent, moderate: Secondary | ICD-10-CM | POA: Diagnosis not present

## 2020-06-04 DIAGNOSIS — G4701 Insomnia due to medical condition: Secondary | ICD-10-CM | POA: Diagnosis not present

## 2020-06-10 DIAGNOSIS — Z79899 Other long term (current) drug therapy: Secondary | ICD-10-CM | POA: Diagnosis not present

## 2020-06-10 DIAGNOSIS — E039 Hypothyroidism, unspecified: Secondary | ICD-10-CM | POA: Diagnosis not present

## 2020-06-10 DIAGNOSIS — D649 Anemia, unspecified: Secondary | ICD-10-CM | POA: Diagnosis not present

## 2020-06-10 DIAGNOSIS — I10 Essential (primary) hypertension: Secondary | ICD-10-CM | POA: Diagnosis not present

## 2020-06-10 DIAGNOSIS — M6281 Muscle weakness (generalized): Secondary | ICD-10-CM | POA: Diagnosis not present

## 2020-06-16 DIAGNOSIS — G8929 Other chronic pain: Secondary | ICD-10-CM | POA: Diagnosis not present

## 2020-06-18 DIAGNOSIS — I69398 Other sequelae of cerebral infarction: Secondary | ICD-10-CM | POA: Diagnosis not present

## 2020-06-18 DIAGNOSIS — K5900 Constipation, unspecified: Secondary | ICD-10-CM | POA: Diagnosis not present

## 2020-06-18 DIAGNOSIS — I482 Chronic atrial fibrillation, unspecified: Secondary | ICD-10-CM | POA: Diagnosis not present

## 2020-06-18 DIAGNOSIS — R32 Unspecified urinary incontinence: Secondary | ICD-10-CM | POA: Diagnosis not present

## 2020-06-18 DIAGNOSIS — H04129 Dry eye syndrome of unspecified lacrimal gland: Secondary | ICD-10-CM | POA: Diagnosis not present

## 2020-06-18 DIAGNOSIS — E785 Hyperlipidemia, unspecified: Secondary | ICD-10-CM | POA: Diagnosis not present

## 2020-06-18 DIAGNOSIS — K219 Gastro-esophageal reflux disease without esophagitis: Secondary | ICD-10-CM | POA: Diagnosis not present

## 2020-06-18 DIAGNOSIS — J449 Chronic obstructive pulmonary disease, unspecified: Secondary | ICD-10-CM | POA: Diagnosis not present

## 2020-06-18 DIAGNOSIS — R1312 Dysphagia, oropharyngeal phase: Secondary | ICD-10-CM | POA: Diagnosis not present

## 2020-06-18 DIAGNOSIS — G894 Chronic pain syndrome: Secondary | ICD-10-CM | POA: Diagnosis not present

## 2020-06-18 DIAGNOSIS — I4891 Unspecified atrial fibrillation: Secondary | ICD-10-CM | POA: Diagnosis not present

## 2020-06-18 DIAGNOSIS — I639 Cerebral infarction, unspecified: Secondary | ICD-10-CM | POA: Diagnosis not present

## 2020-07-01 DIAGNOSIS — K219 Gastro-esophageal reflux disease without esophagitis: Secondary | ICD-10-CM | POA: Diagnosis not present

## 2020-07-01 DIAGNOSIS — E785 Hyperlipidemia, unspecified: Secondary | ICD-10-CM | POA: Diagnosis not present

## 2020-07-01 DIAGNOSIS — I482 Chronic atrial fibrillation, unspecified: Secondary | ICD-10-CM | POA: Diagnosis not present

## 2020-07-01 DIAGNOSIS — R54 Age-related physical debility: Secondary | ICD-10-CM | POA: Diagnosis not present

## 2020-07-01 DIAGNOSIS — J449 Chronic obstructive pulmonary disease, unspecified: Secondary | ICD-10-CM | POA: Diagnosis not present

## 2020-07-01 DIAGNOSIS — G894 Chronic pain syndrome: Secondary | ICD-10-CM | POA: Diagnosis not present

## 2020-07-01 DIAGNOSIS — C3411 Malignant neoplasm of upper lobe, right bronchus or lung: Secondary | ICD-10-CM | POA: Diagnosis not present

## 2020-07-01 DIAGNOSIS — M810 Age-related osteoporosis without current pathological fracture: Secondary | ICD-10-CM | POA: Diagnosis not present

## 2020-07-01 DIAGNOSIS — I639 Cerebral infarction, unspecified: Secondary | ICD-10-CM | POA: Diagnosis not present

## 2020-07-01 DIAGNOSIS — R32 Unspecified urinary incontinence: Secondary | ICD-10-CM | POA: Diagnosis not present

## 2020-07-02 DIAGNOSIS — F331 Major depressive disorder, recurrent, moderate: Secondary | ICD-10-CM | POA: Diagnosis not present

## 2020-07-02 DIAGNOSIS — F0151 Vascular dementia with behavioral disturbance: Secondary | ICD-10-CM | POA: Diagnosis not present

## 2020-07-02 DIAGNOSIS — G4701 Insomnia due to medical condition: Secondary | ICD-10-CM | POA: Diagnosis not present

## 2020-07-08 DIAGNOSIS — M21611 Bunion of right foot: Secondary | ICD-10-CM | POA: Diagnosis not present

## 2020-07-08 DIAGNOSIS — I739 Peripheral vascular disease, unspecified: Secondary | ICD-10-CM | POA: Diagnosis not present

## 2020-07-08 DIAGNOSIS — M21612 Bunion of left foot: Secondary | ICD-10-CM | POA: Diagnosis not present

## 2020-07-08 DIAGNOSIS — L602 Onychogryphosis: Secondary | ICD-10-CM | POA: Diagnosis not present

## 2020-07-14 DIAGNOSIS — N950 Postmenopausal bleeding: Secondary | ICD-10-CM | POA: Diagnosis not present

## 2020-07-15 DIAGNOSIS — N939 Abnormal uterine and vaginal bleeding, unspecified: Secondary | ICD-10-CM | POA: Diagnosis not present

## 2020-07-15 DIAGNOSIS — N390 Urinary tract infection, site not specified: Secondary | ICD-10-CM | POA: Diagnosis not present

## 2020-07-23 DIAGNOSIS — I4891 Unspecified atrial fibrillation: Secondary | ICD-10-CM | POA: Diagnosis not present

## 2020-07-23 DIAGNOSIS — H04129 Dry eye syndrome of unspecified lacrimal gland: Secondary | ICD-10-CM | POA: Diagnosis not present

## 2020-07-23 DIAGNOSIS — K219 Gastro-esophageal reflux disease without esophagitis: Secondary | ICD-10-CM | POA: Diagnosis not present

## 2020-07-23 DIAGNOSIS — R1312 Dysphagia, oropharyngeal phase: Secondary | ICD-10-CM | POA: Diagnosis not present

## 2020-07-23 DIAGNOSIS — C3411 Malignant neoplasm of upper lobe, right bronchus or lung: Secondary | ICD-10-CM | POA: Diagnosis not present

## 2020-07-23 DIAGNOSIS — E785 Hyperlipidemia, unspecified: Secondary | ICD-10-CM | POA: Diagnosis not present

## 2020-07-23 DIAGNOSIS — R32 Unspecified urinary incontinence: Secondary | ICD-10-CM | POA: Diagnosis not present

## 2020-07-23 DIAGNOSIS — K5900 Constipation, unspecified: Secondary | ICD-10-CM | POA: Diagnosis not present

## 2020-07-23 DIAGNOSIS — I639 Cerebral infarction, unspecified: Secondary | ICD-10-CM | POA: Diagnosis not present

## 2020-07-23 DIAGNOSIS — G894 Chronic pain syndrome: Secondary | ICD-10-CM | POA: Diagnosis not present

## 2020-07-23 DIAGNOSIS — I482 Chronic atrial fibrillation, unspecified: Secondary | ICD-10-CM | POA: Diagnosis not present

## 2020-07-23 DIAGNOSIS — F0150 Vascular dementia without behavioral disturbance: Secondary | ICD-10-CM | POA: Diagnosis not present

## 2020-07-30 DIAGNOSIS — F331 Major depressive disorder, recurrent, moderate: Secondary | ICD-10-CM | POA: Diagnosis not present

## 2020-07-30 DIAGNOSIS — G4701 Insomnia due to medical condition: Secondary | ICD-10-CM | POA: Diagnosis not present

## 2020-07-30 DIAGNOSIS — F0150 Vascular dementia without behavioral disturbance: Secondary | ICD-10-CM | POA: Diagnosis not present

## 2020-08-11 DIAGNOSIS — Z23 Encounter for immunization: Secondary | ICD-10-CM | POA: Diagnosis not present

## 2020-08-18 DIAGNOSIS — G894 Chronic pain syndrome: Secondary | ICD-10-CM | POA: Diagnosis not present

## 2020-08-20 DIAGNOSIS — I639 Cerebral infarction, unspecified: Secondary | ICD-10-CM | POA: Diagnosis not present

## 2020-08-20 DIAGNOSIS — F331 Major depressive disorder, recurrent, moderate: Secondary | ICD-10-CM | POA: Diagnosis not present

## 2020-08-20 DIAGNOSIS — R1312 Dysphagia, oropharyngeal phase: Secondary | ICD-10-CM | POA: Diagnosis not present

## 2020-08-20 DIAGNOSIS — K5900 Constipation, unspecified: Secondary | ICD-10-CM | POA: Diagnosis not present

## 2020-08-20 DIAGNOSIS — M6281 Muscle weakness (generalized): Secondary | ICD-10-CM | POA: Diagnosis not present

## 2020-08-20 DIAGNOSIS — I482 Chronic atrial fibrillation, unspecified: Secondary | ICD-10-CM | POA: Diagnosis not present

## 2020-08-20 DIAGNOSIS — I4891 Unspecified atrial fibrillation: Secondary | ICD-10-CM | POA: Diagnosis not present

## 2020-08-20 DIAGNOSIS — H04129 Dry eye syndrome of unspecified lacrimal gland: Secondary | ICD-10-CM | POA: Diagnosis not present

## 2020-08-20 DIAGNOSIS — E785 Hyperlipidemia, unspecified: Secondary | ICD-10-CM | POA: Diagnosis not present

## 2020-08-20 DIAGNOSIS — R32 Unspecified urinary incontinence: Secondary | ICD-10-CM | POA: Diagnosis not present

## 2020-08-20 DIAGNOSIS — F0150 Vascular dementia without behavioral disturbance: Secondary | ICD-10-CM | POA: Diagnosis not present

## 2020-08-20 DIAGNOSIS — G894 Chronic pain syndrome: Secondary | ICD-10-CM | POA: Diagnosis not present

## 2020-08-21 DIAGNOSIS — H25811 Combined forms of age-related cataract, right eye: Secondary | ICD-10-CM | POA: Diagnosis not present

## 2020-08-21 DIAGNOSIS — H02055 Trichiasis without entropian left lower eyelid: Secondary | ICD-10-CM | POA: Diagnosis not present

## 2020-08-21 DIAGNOSIS — E119 Type 2 diabetes mellitus without complications: Secondary | ICD-10-CM | POA: Diagnosis not present

## 2020-08-21 DIAGNOSIS — K219 Gastro-esophageal reflux disease without esophagitis: Secondary | ICD-10-CM | POA: Diagnosis not present

## 2020-08-21 DIAGNOSIS — Z961 Presence of intraocular lens: Secondary | ICD-10-CM | POA: Diagnosis not present

## 2020-08-21 DIAGNOSIS — H50111 Monocular exotropia, right eye: Secondary | ICD-10-CM | POA: Diagnosis not present

## 2020-08-27 DIAGNOSIS — G4701 Insomnia due to medical condition: Secondary | ICD-10-CM | POA: Diagnosis not present

## 2020-08-27 DIAGNOSIS — F331 Major depressive disorder, recurrent, moderate: Secondary | ICD-10-CM | POA: Diagnosis not present

## 2020-08-27 DIAGNOSIS — F0150 Vascular dementia without behavioral disturbance: Secondary | ICD-10-CM | POA: Diagnosis not present

## 2020-09-01 DIAGNOSIS — I482 Chronic atrial fibrillation, unspecified: Secondary | ICD-10-CM | POA: Diagnosis not present

## 2020-09-01 DIAGNOSIS — C3490 Malignant neoplasm of unspecified part of unspecified bronchus or lung: Secondary | ICD-10-CM | POA: Diagnosis not present

## 2020-09-01 DIAGNOSIS — N39498 Other specified urinary incontinence: Secondary | ICD-10-CM | POA: Diagnosis not present

## 2020-09-01 DIAGNOSIS — M6281 Muscle weakness (generalized): Secondary | ICD-10-CM | POA: Diagnosis not present

## 2020-09-01 DIAGNOSIS — E785 Hyperlipidemia, unspecified: Secondary | ICD-10-CM | POA: Diagnosis not present

## 2020-09-01 DIAGNOSIS — I639 Cerebral infarction, unspecified: Secondary | ICD-10-CM | POA: Diagnosis not present

## 2020-09-01 DIAGNOSIS — R54 Age-related physical debility: Secondary | ICD-10-CM | POA: Diagnosis not present

## 2020-09-01 DIAGNOSIS — J449 Chronic obstructive pulmonary disease, unspecified: Secondary | ICD-10-CM | POA: Diagnosis not present

## 2020-09-01 DIAGNOSIS — F0150 Vascular dementia without behavioral disturbance: Secondary | ICD-10-CM | POA: Diagnosis not present

## 2020-09-01 DIAGNOSIS — K219 Gastro-esophageal reflux disease without esophagitis: Secondary | ICD-10-CM | POA: Diagnosis not present

## 2020-09-01 DIAGNOSIS — G8929 Other chronic pain: Secondary | ICD-10-CM | POA: Diagnosis not present

## 2020-09-01 DIAGNOSIS — M810 Age-related osteoporosis without current pathological fracture: Secondary | ICD-10-CM | POA: Diagnosis not present

## 2020-09-11 DIAGNOSIS — R531 Weakness: Secondary | ICD-10-CM | POA: Diagnosis not present

## 2020-09-23 DIAGNOSIS — H906 Mixed conductive and sensorineural hearing loss, bilateral: Secondary | ICD-10-CM | POA: Diagnosis not present

## 2020-09-24 DIAGNOSIS — F331 Major depressive disorder, recurrent, moderate: Secondary | ICD-10-CM | POA: Diagnosis not present

## 2020-09-24 DIAGNOSIS — F064 Anxiety disorder due to known physiological condition: Secondary | ICD-10-CM | POA: Diagnosis not present

## 2020-09-24 DIAGNOSIS — F0151 Vascular dementia with behavioral disturbance: Secondary | ICD-10-CM | POA: Diagnosis not present

## 2020-09-24 DIAGNOSIS — G4701 Insomnia due to medical condition: Secondary | ICD-10-CM | POA: Diagnosis not present

## 2020-10-17 DIAGNOSIS — N39498 Other specified urinary incontinence: Secondary | ICD-10-CM | POA: Diagnosis not present

## 2020-10-17 DIAGNOSIS — R32 Unspecified urinary incontinence: Secondary | ICD-10-CM | POA: Diagnosis not present

## 2020-10-17 DIAGNOSIS — J449 Chronic obstructive pulmonary disease, unspecified: Secondary | ICD-10-CM | POA: Diagnosis not present

## 2020-10-17 DIAGNOSIS — F0150 Vascular dementia without behavioral disturbance: Secondary | ICD-10-CM | POA: Diagnosis not present

## 2020-10-17 DIAGNOSIS — M810 Age-related osteoporosis without current pathological fracture: Secondary | ICD-10-CM | POA: Diagnosis not present

## 2020-10-17 DIAGNOSIS — E785 Hyperlipidemia, unspecified: Secondary | ICD-10-CM | POA: Diagnosis not present

## 2020-10-17 DIAGNOSIS — I639 Cerebral infarction, unspecified: Secondary | ICD-10-CM | POA: Diagnosis not present

## 2020-10-17 DIAGNOSIS — G4700 Insomnia, unspecified: Secondary | ICD-10-CM | POA: Diagnosis not present

## 2020-10-17 DIAGNOSIS — M6281 Muscle weakness (generalized): Secondary | ICD-10-CM | POA: Diagnosis not present

## 2020-10-17 DIAGNOSIS — G8929 Other chronic pain: Secondary | ICD-10-CM | POA: Diagnosis not present

## 2020-10-17 DIAGNOSIS — K219 Gastro-esophageal reflux disease without esophagitis: Secondary | ICD-10-CM | POA: Diagnosis not present

## 2020-10-17 DIAGNOSIS — I482 Chronic atrial fibrillation, unspecified: Secondary | ICD-10-CM | POA: Diagnosis not present

## 2021-10-18 DEATH — deceased
